# Patient Record
Sex: Female | Born: 1935
Health system: Southern US, Community
[De-identification: ages and names within clinical notes are randomized; demographics above are authoritative.]

## PROBLEM LIST (undated history)

## (undated) DIAGNOSIS — I10 Essential (primary) hypertension: Secondary | ICD-10-CM

## (undated) DIAGNOSIS — I509 Heart failure, unspecified: Secondary | ICD-10-CM

## (undated) DIAGNOSIS — I6529 Occlusion and stenosis of unspecified carotid artery: Secondary | ICD-10-CM

## (undated) DIAGNOSIS — M1990 Unspecified osteoarthritis, unspecified site: Secondary | ICD-10-CM

## (undated) DIAGNOSIS — M069 Rheumatoid arthritis, unspecified: Secondary | ICD-10-CM

## (undated) DIAGNOSIS — F32A Depression, unspecified: Secondary | ICD-10-CM

## (undated) DIAGNOSIS — I4891 Unspecified atrial fibrillation: Secondary | ICD-10-CM

## (undated) DIAGNOSIS — M2550 Pain in unspecified joint: Secondary | ICD-10-CM

## (undated) DIAGNOSIS — F0390 Unspecified dementia without behavioral disturbance: Secondary | ICD-10-CM

## (undated) DIAGNOSIS — E079 Disorder of thyroid, unspecified: Secondary | ICD-10-CM

## (undated) DIAGNOSIS — I2510 Atherosclerotic heart disease of native coronary artery without angina pectoris: Secondary | ICD-10-CM

## (undated) DIAGNOSIS — F0150 Vascular dementia without behavioral disturbance: Secondary | ICD-10-CM

## (undated) DIAGNOSIS — E039 Hypothyroidism, unspecified: Secondary | ICD-10-CM

## (undated) DIAGNOSIS — F419 Anxiety disorder, unspecified: Secondary | ICD-10-CM

## (undated) DIAGNOSIS — D649 Anemia, unspecified: Secondary | ICD-10-CM

## (undated) DIAGNOSIS — K922 Gastrointestinal hemorrhage, unspecified: Secondary | ICD-10-CM

## (undated) DIAGNOSIS — K859 Acute pancreatitis without necrosis or infection, unspecified: Secondary | ICD-10-CM

## (undated) DIAGNOSIS — M6281 Muscle weakness (generalized): Secondary | ICD-10-CM

## (undated) DIAGNOSIS — K219 Gastro-esophageal reflux disease without esophagitis: Secondary | ICD-10-CM

## (undated) DIAGNOSIS — I739 Peripheral vascular disease, unspecified: Secondary | ICD-10-CM

## (undated) DIAGNOSIS — N289 Disorder of kidney and ureter, unspecified: Secondary | ICD-10-CM

## (undated) DIAGNOSIS — E785 Hyperlipidemia, unspecified: Secondary | ICD-10-CM

## (undated) DIAGNOSIS — I82409 Acute embolism and thrombosis of unspecified deep veins of unspecified lower extremity: Secondary | ICD-10-CM

## (undated) DIAGNOSIS — I639 Cerebral infarction, unspecified: Secondary | ICD-10-CM

## (undated) DIAGNOSIS — N189 Chronic kidney disease, unspecified: Secondary | ICD-10-CM

## (undated) HISTORY — DX: Unspecified osteoarthritis, unspecified site: M19.90

## (undated) HISTORY — PX: TOTAL KNEE ARTHROPLASTY: SHX125

## (undated) HISTORY — PX: FASCIOTOMY: SHX132

## (undated) HISTORY — PX: HIP FRACTURE SURGERY: SHX118

## (undated) HISTORY — DX: Cerebral infarction, unspecified: I63.9

## (undated) HISTORY — PX: TUBAL LIGATION: SHX77

## (undated) HISTORY — DX: Anemia, unspecified: D64.9

## (undated) HISTORY — DX: Hypothyroidism, unspecified: E03.9

## (undated) HISTORY — DX: Occlusion and stenosis of unspecified carotid artery: I65.29

## (undated) HISTORY — DX: Peripheral vascular disease, unspecified: I73.9

## (undated) HISTORY — DX: Unspecified atrial fibrillation: I48.91

## (undated) HISTORY — DX: Unspecified dementia, unspecified severity, without behavioral disturbance, psychotic disturbance, mood disturbance, and anxiety: F03.90

## (undated) HISTORY — DX: Gastro-esophageal reflux disease without esophagitis: K21.9

## (undated) HISTORY — PX: JOINT REPLACEMENT: SHX530

## (undated) HISTORY — PX: CORONARY ARTERY BYPASS GRAFT: SHX141

## (undated) HISTORY — DX: Heart failure, unspecified: I50.9

## (undated) HISTORY — DX: Depression, unspecified: F32.A

## (undated) HISTORY — DX: Acute pancreatitis without necrosis or infection, unspecified: K85.90

## (undated) HISTORY — DX: Pain in unspecified joint: M25.50

## (undated) HISTORY — DX: Gastrointestinal hemorrhage, unspecified: K92.2

## (undated) HISTORY — DX: Disorder of thyroid, unspecified: E07.9

## (undated) HISTORY — DX: Rheumatoid arthritis, unspecified: M06.9

## (undated) HISTORY — DX: Anxiety disorder, unspecified: F41.9

## (undated) HISTORY — DX: Chronic kidney disease, unspecified: N18.9

## (undated) HISTORY — DX: Acute embolism and thrombosis of unspecified deep veins of unspecified lower extremity: I82.409

## (undated) HISTORY — PX: CELIAC ARTERY STENT: SHX1319

## (undated) HISTORY — DX: Atherosclerotic heart disease of native coronary artery without angina pectoris: I25.10

## (undated) HISTORY — DX: Essential (primary) hypertension: I10

## (undated) HISTORY — PX: APPENDECTOMY: SHX54

---

## 2005-04-07 ENCOUNTER — Ambulatory Visit: Payer: Self-pay | Admitting: Cardiology

## 2007-04-12 HISTORY — PX: EMBOLECTOMY: SHX44

## 2008-01-24 ENCOUNTER — Ambulatory Visit: Payer: Self-pay | Admitting: Cardiology

## 2008-01-27 ENCOUNTER — Ambulatory Visit: Payer: Self-pay | Admitting: Cardiothoracic Surgery

## 2008-01-27 ENCOUNTER — Ambulatory Visit: Payer: Self-pay | Admitting: Cardiology

## 2008-01-27 ENCOUNTER — Inpatient Hospital Stay (HOSPITAL_COMMUNITY): Admission: AD | Admit: 2008-01-27 | Discharge: 2008-02-22 | Payer: Self-pay | Admitting: Cardiology

## 2008-01-28 ENCOUNTER — Encounter: Payer: Self-pay | Admitting: Cardiothoracic Surgery

## 2008-01-29 IMAGING — CR DG CHEST 1V PORT
1 series · 1 of 1 positions shown · non-contrast
Comparison: [DATE]

CLINICAL DATA: Status post CABG

PORTABLE CHEST - 1 VIEW

[AP]
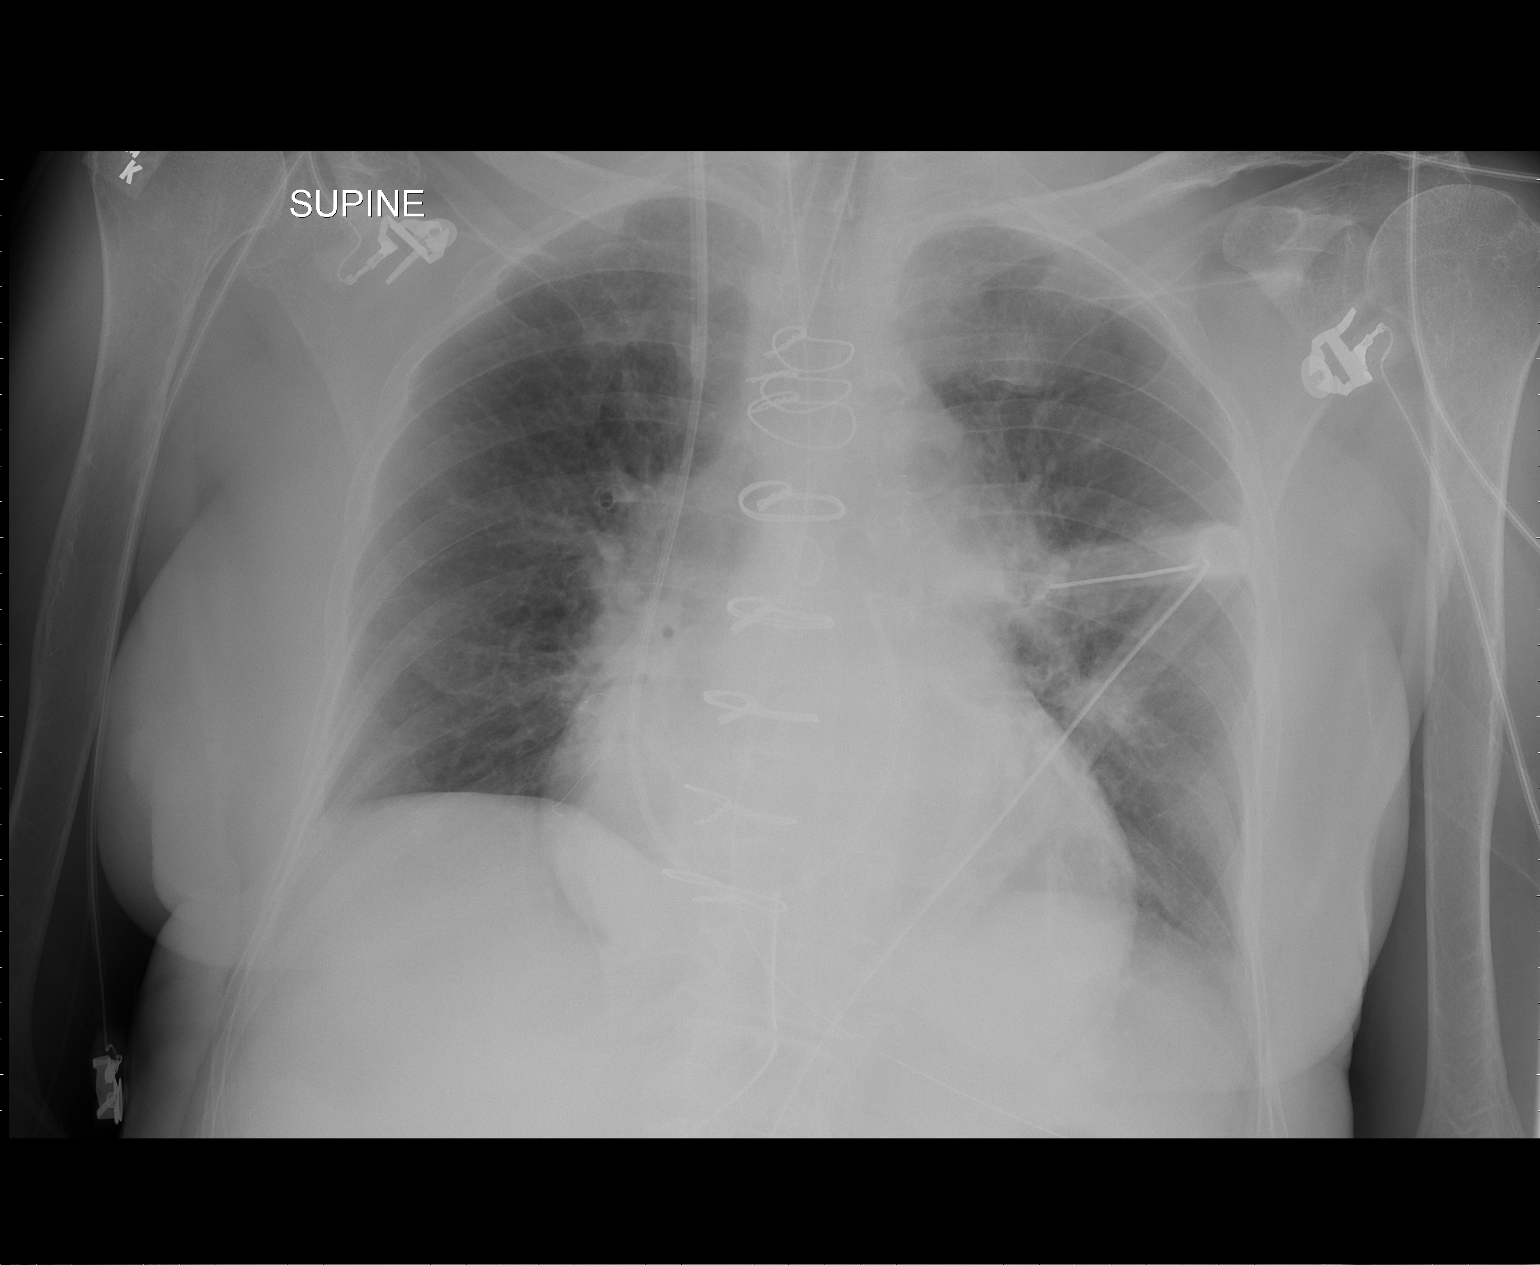

[1 of 1 positions shown; findings below may reference images not displayed]

FINDINGS: Left chest tube present postoperatively.  No evidence of
pneumothorax.  Endotracheal tube tip lies approximately 3.5 cm
above the carina.  Swan-Ganz catheter tip lies in the main
pulmonary outflow tract.  Nasogastric tube extends into the
stomach.

No evidence of edema or significant pleural fluid.  Left lower lobe
atelectasis present.  The heart size and mediastinal contours are
stable since prior study.
IMPRESSION: Left lower lobe atelectasis postoperatively.  No pneumothorax or
edema.  Satisfactory positioning of support apparatus.

## 2008-01-30 IMAGING — CR DG CHEST 1V PORT
1 series · 1 of 1 positions shown · non-contrast
Comparison: [DATE].
COMPARISON:

Addendum Begins

PORTABLE CHEST, one-view:
CLINICAL DATA: PORTABLE CHEST - 1 VIEW

[AP]
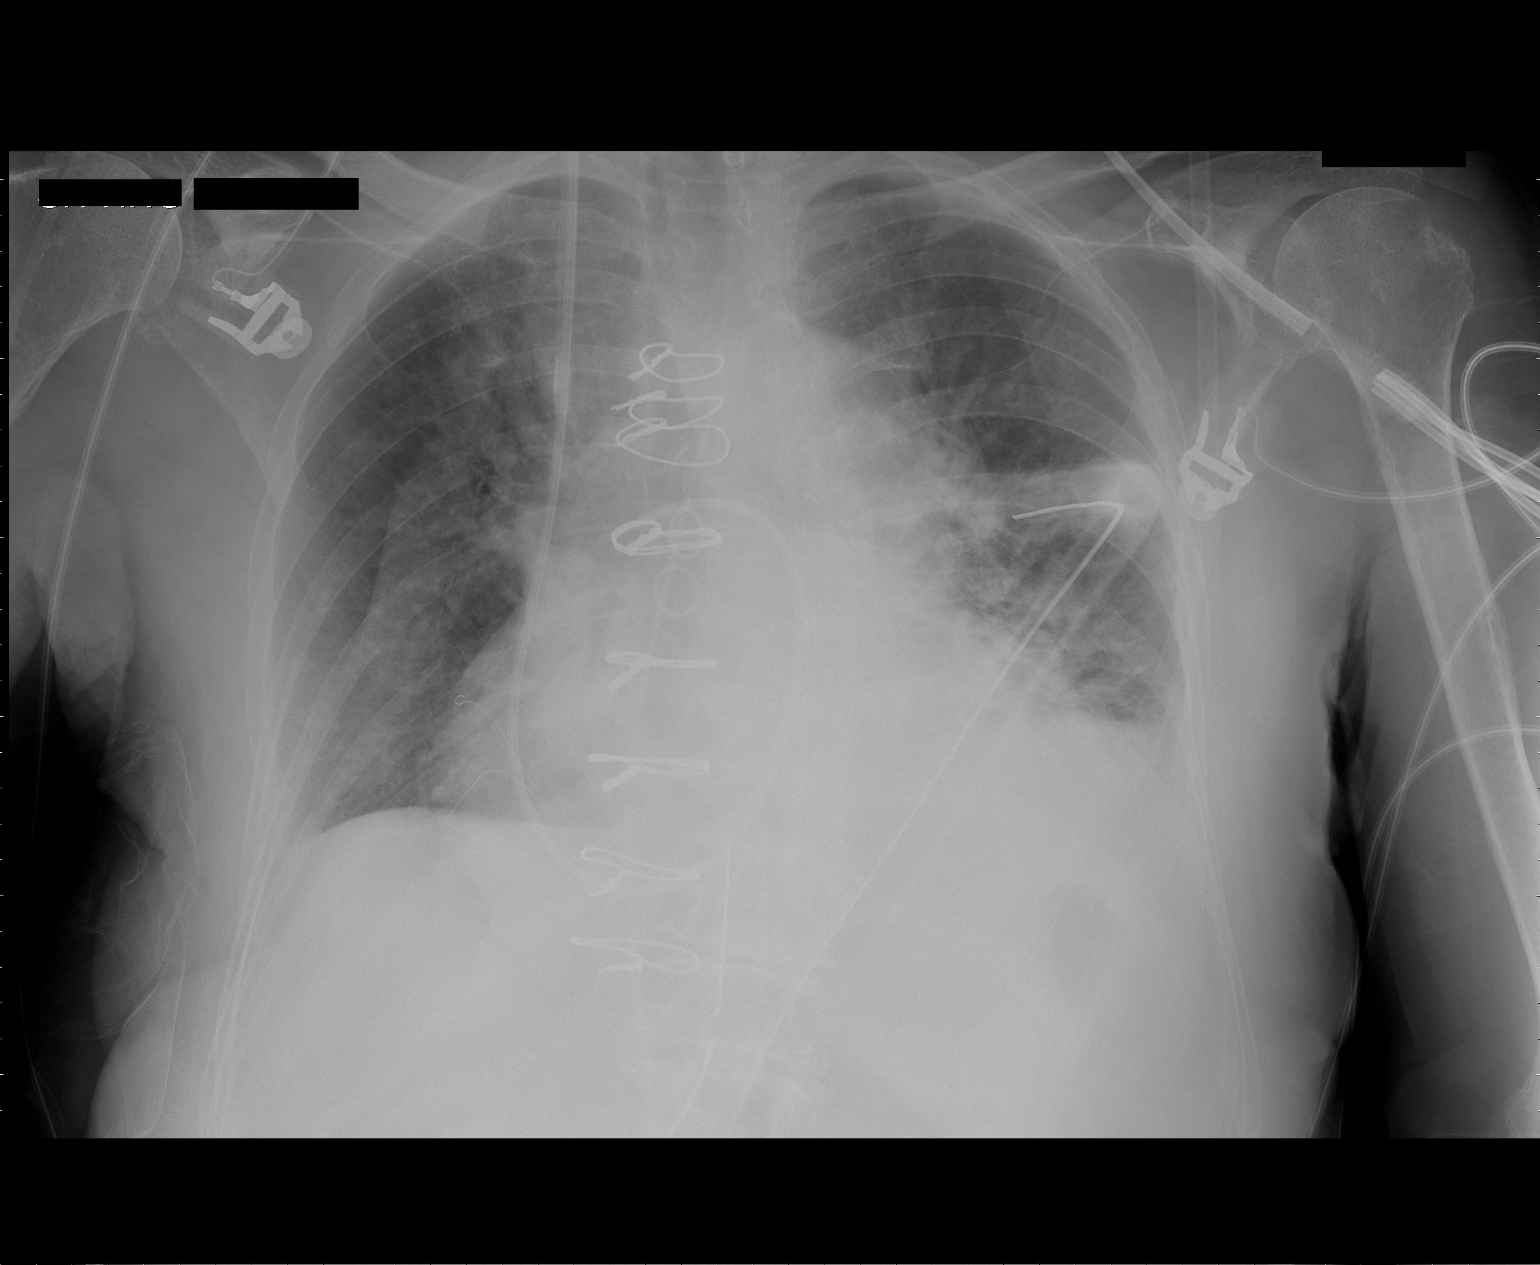

[1 of 1 positions shown; findings below may reference images not displayed]

FINDINGS: The ET tube and NG tube have been removed.  Mediastinal
and left pleural chest tubes are in position.  SGC is in the
proximal descending portion of the right PA.  Mild atelectasis at
the left base.  No pneumothorax.  Pulmonary vascular congestion may
have increased slightly in degree.
IMPRESSION: No pneumothorax.  Left base atelectasis.  Pulmonary
vascular congestion.

Addendum Ends
FINDINGS: 
IMPRESSION:

## 2008-01-31 IMAGING — CR DG CHEST 1V PORT
1 series · 1 of 1 positions shown · non-contrast
Comparison: [DATE] study.

CLINICAL DATA: History given of coronary bypass grafting.

PORTABLE CHEST - 1 VIEW

[view not recorded]
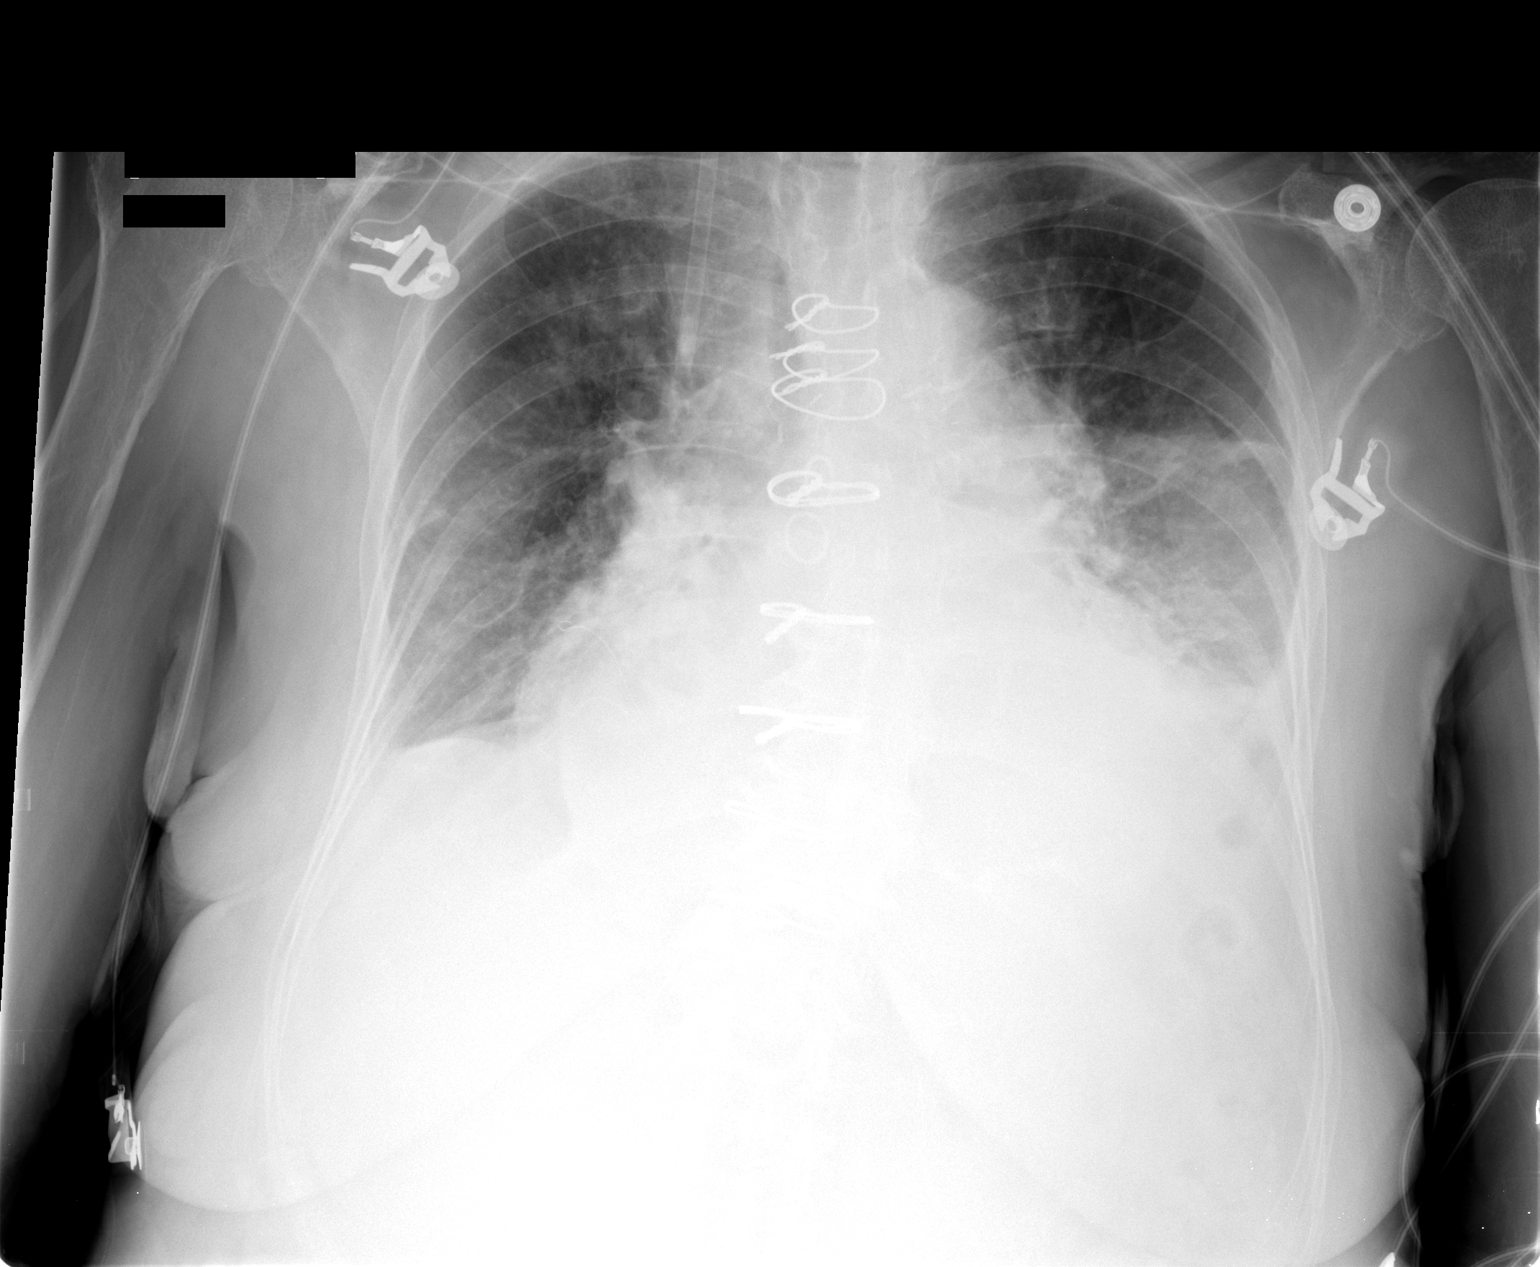

[1 of 1 positions shown; findings below may reference images not displayed]

FINDINGS: Previous median sternotomy has been performed.  Right
venous catheter has tip in superior vena cava.  There is stable
moderate enlargement of the cardiac silhouette.  The midline
drainage tube has been removed.  There is minimal atelectasis in
the right base.  The left chest tube has been removed.  There is a
tiny apical pneumothorax on the left.  There is haziness and
atelectasis and infiltrative density seen in the left mid and lower
lung zones with small left pleural effusion.
IMPRESSION: The left chest tube has been removed.  There is a tiny
apical pneumothorax on the left.  There is haziness and atelectasis
and infiltrative density seen in the left mid and lower lung zones
with small left pleural effusion.

## 2008-02-01 IMAGING — CR DG CHEST 2V
2 series · 2 of 2 positions shown · non-contrast
Comparison: [DATE]

CLINICAL DATA: Chest pain.  Bypass surgery.

CHEST - 2 VIEW

[w chest pa]
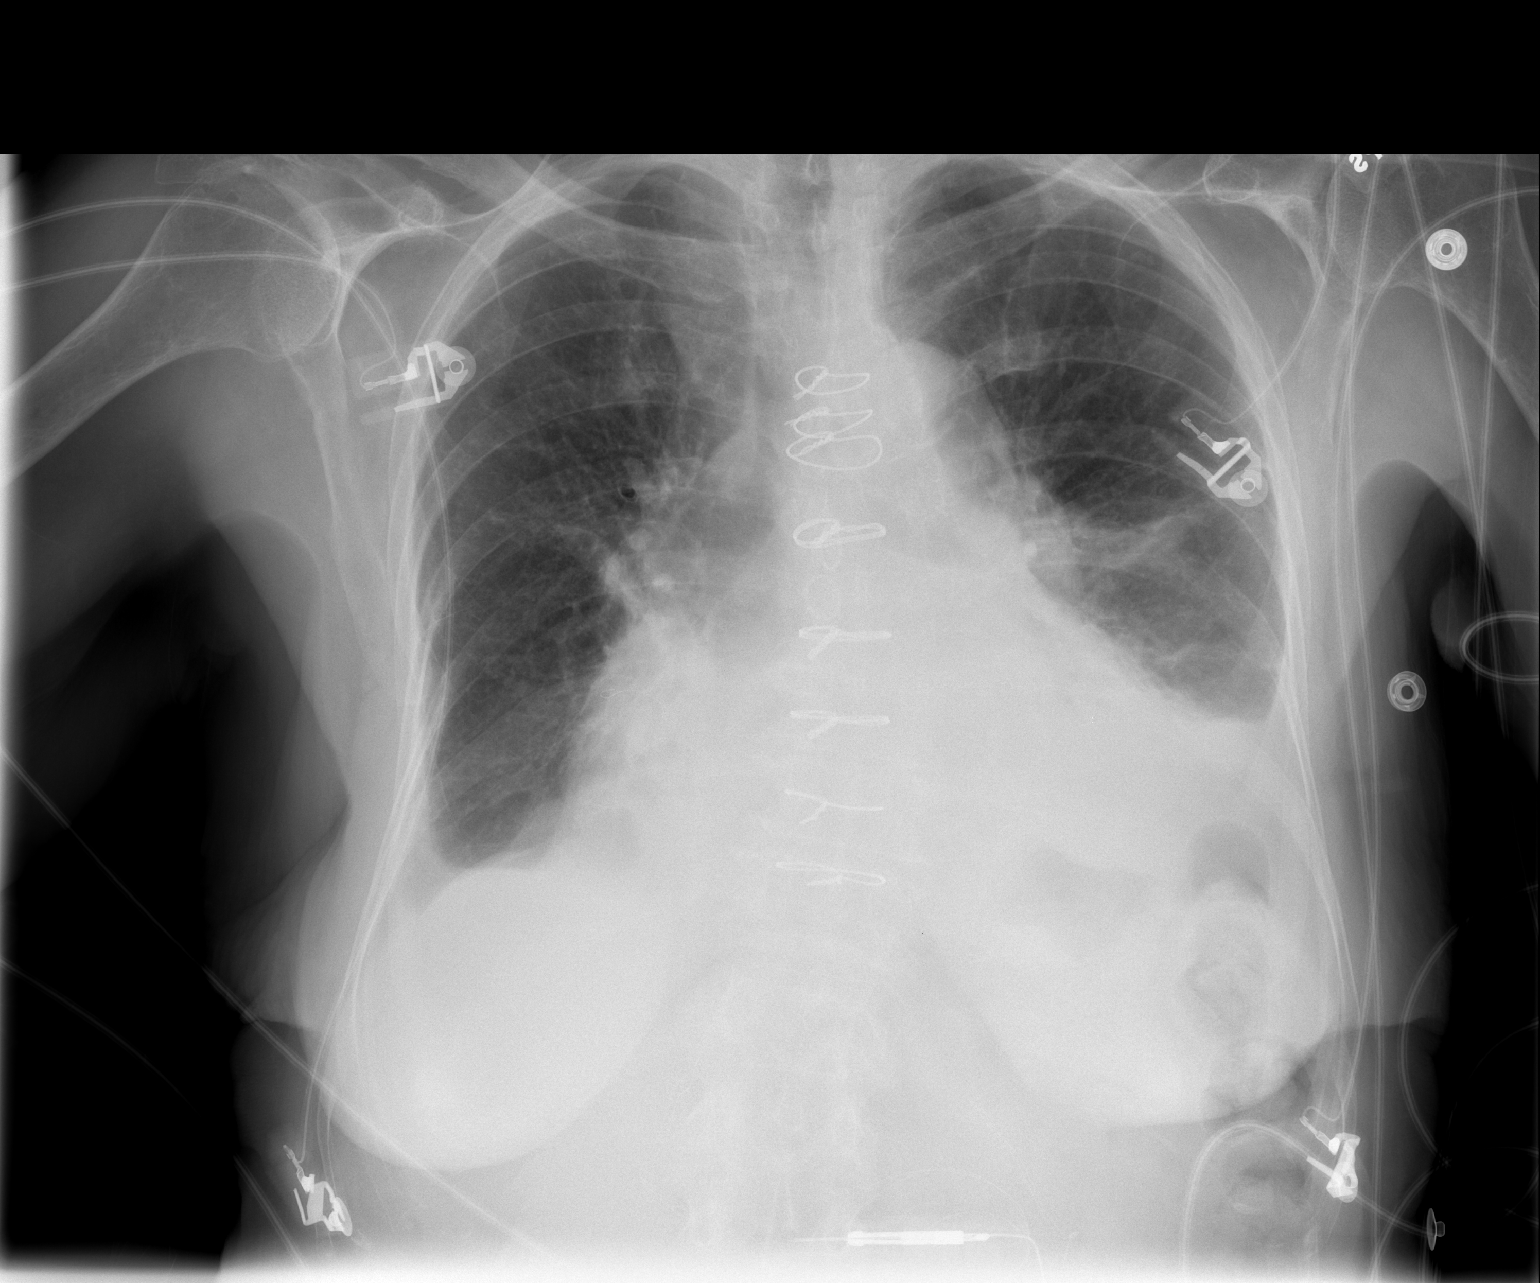

[w chest lat]
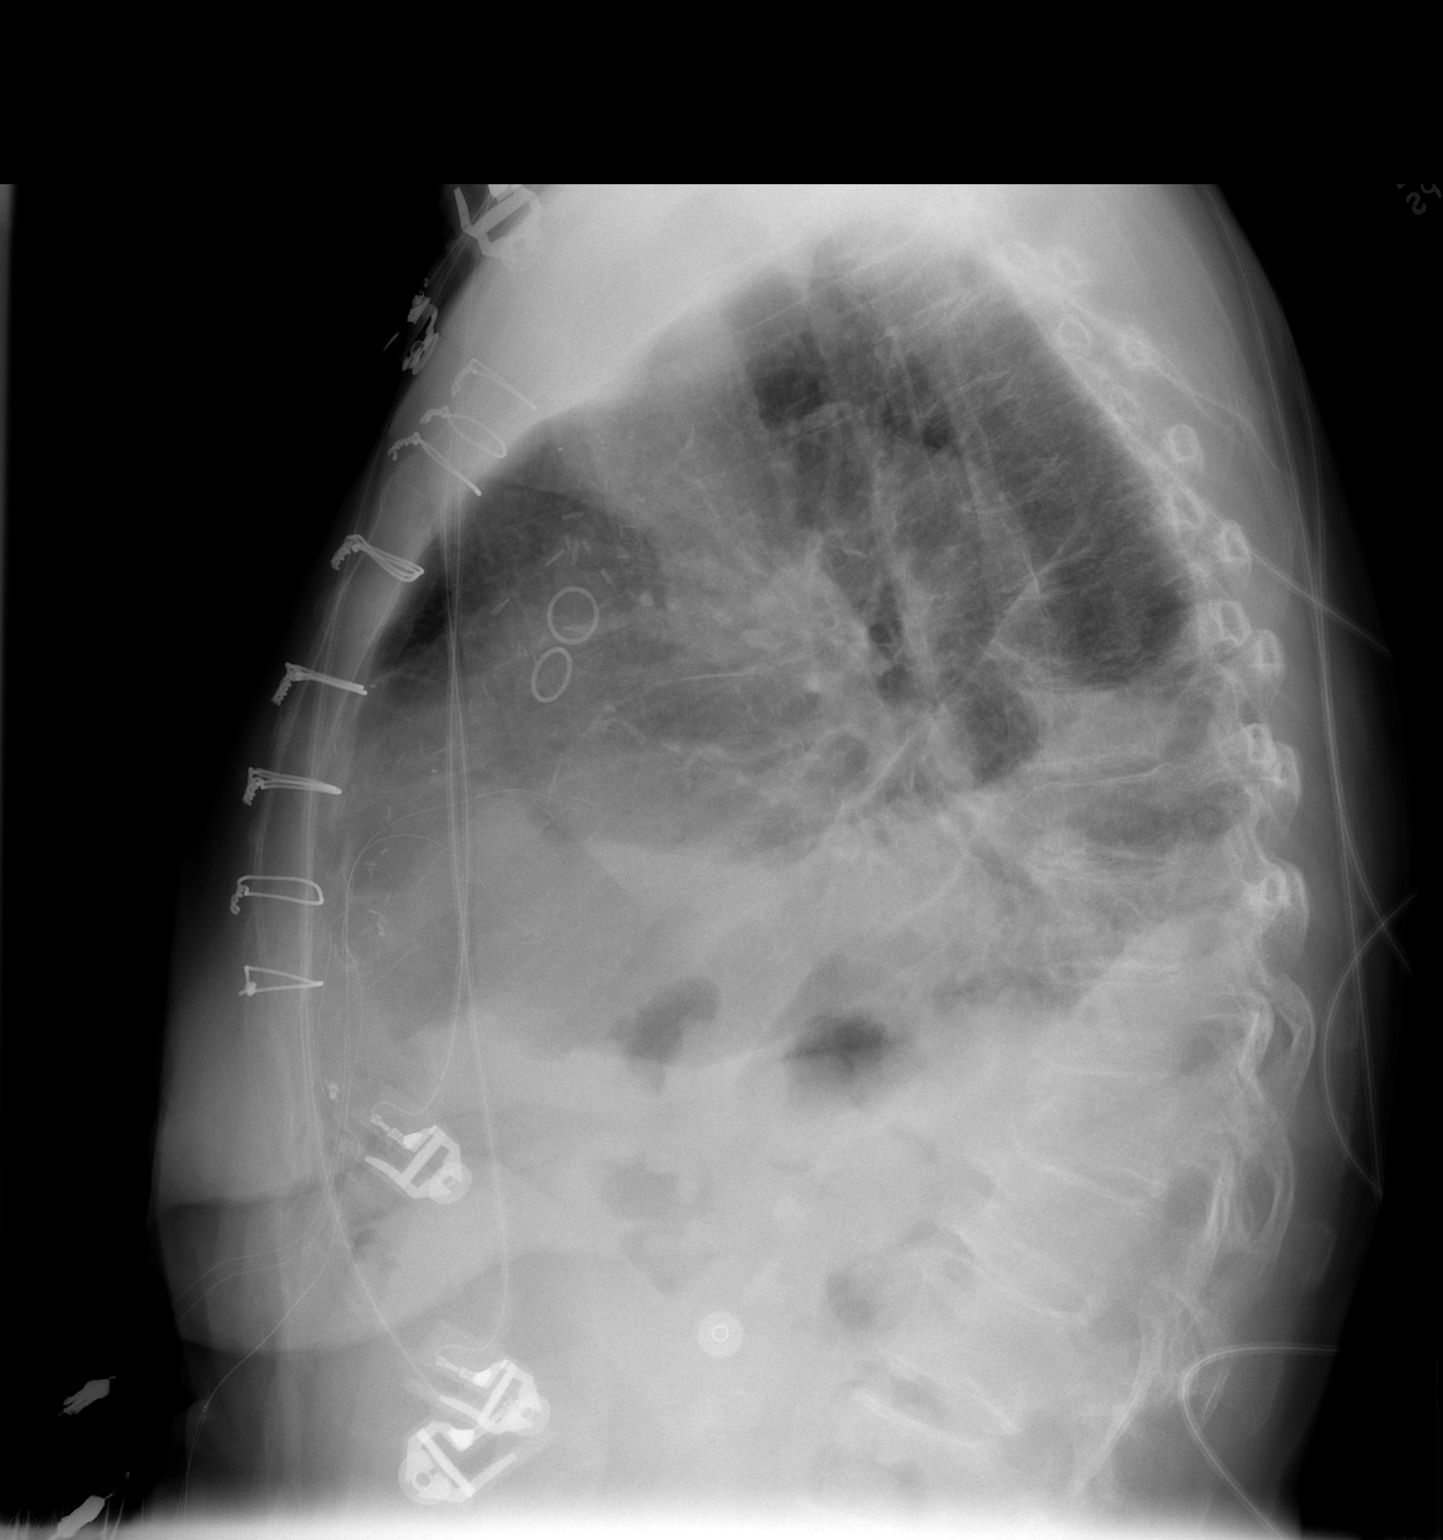

[2 of 2 positions shown; findings below may reference images not displayed]

FINDINGS: Improved lung aeration with resolving edema and
atelectasis.  There are persistent small bilateral pleural
effusions with overlying atelectasis.  No definite pneumothorax.
IMPRESSION: 1.  Improving lung aeration with resolving edema and atelectasis.
2.  Persistent bilateral pleural effusions, left greater than right
with overlying atelectasis.
3.  Removal of right IJ Cordis.

## 2008-02-04 ENCOUNTER — Encounter: Payer: Self-pay | Admitting: Cardiothoracic Surgery

## 2008-02-04 ENCOUNTER — Ambulatory Visit: Payer: Self-pay | Admitting: *Deleted

## 2008-02-05 ENCOUNTER — Encounter: Payer: Self-pay | Admitting: Vascular Surgery

## 2008-02-05 IMAGING — CR DG ANG/EXT/UNI/OR RIGHT
1 series · 1 of 1 positions shown · non-contrast
Comparison: None.

CLINICAL DATA: Peripheral vascular disease.

INTRAOPERATIVE  LEFT LOWER EXTREMITY ARTERIOGRAPHY [DATE]:

[view not recorded]
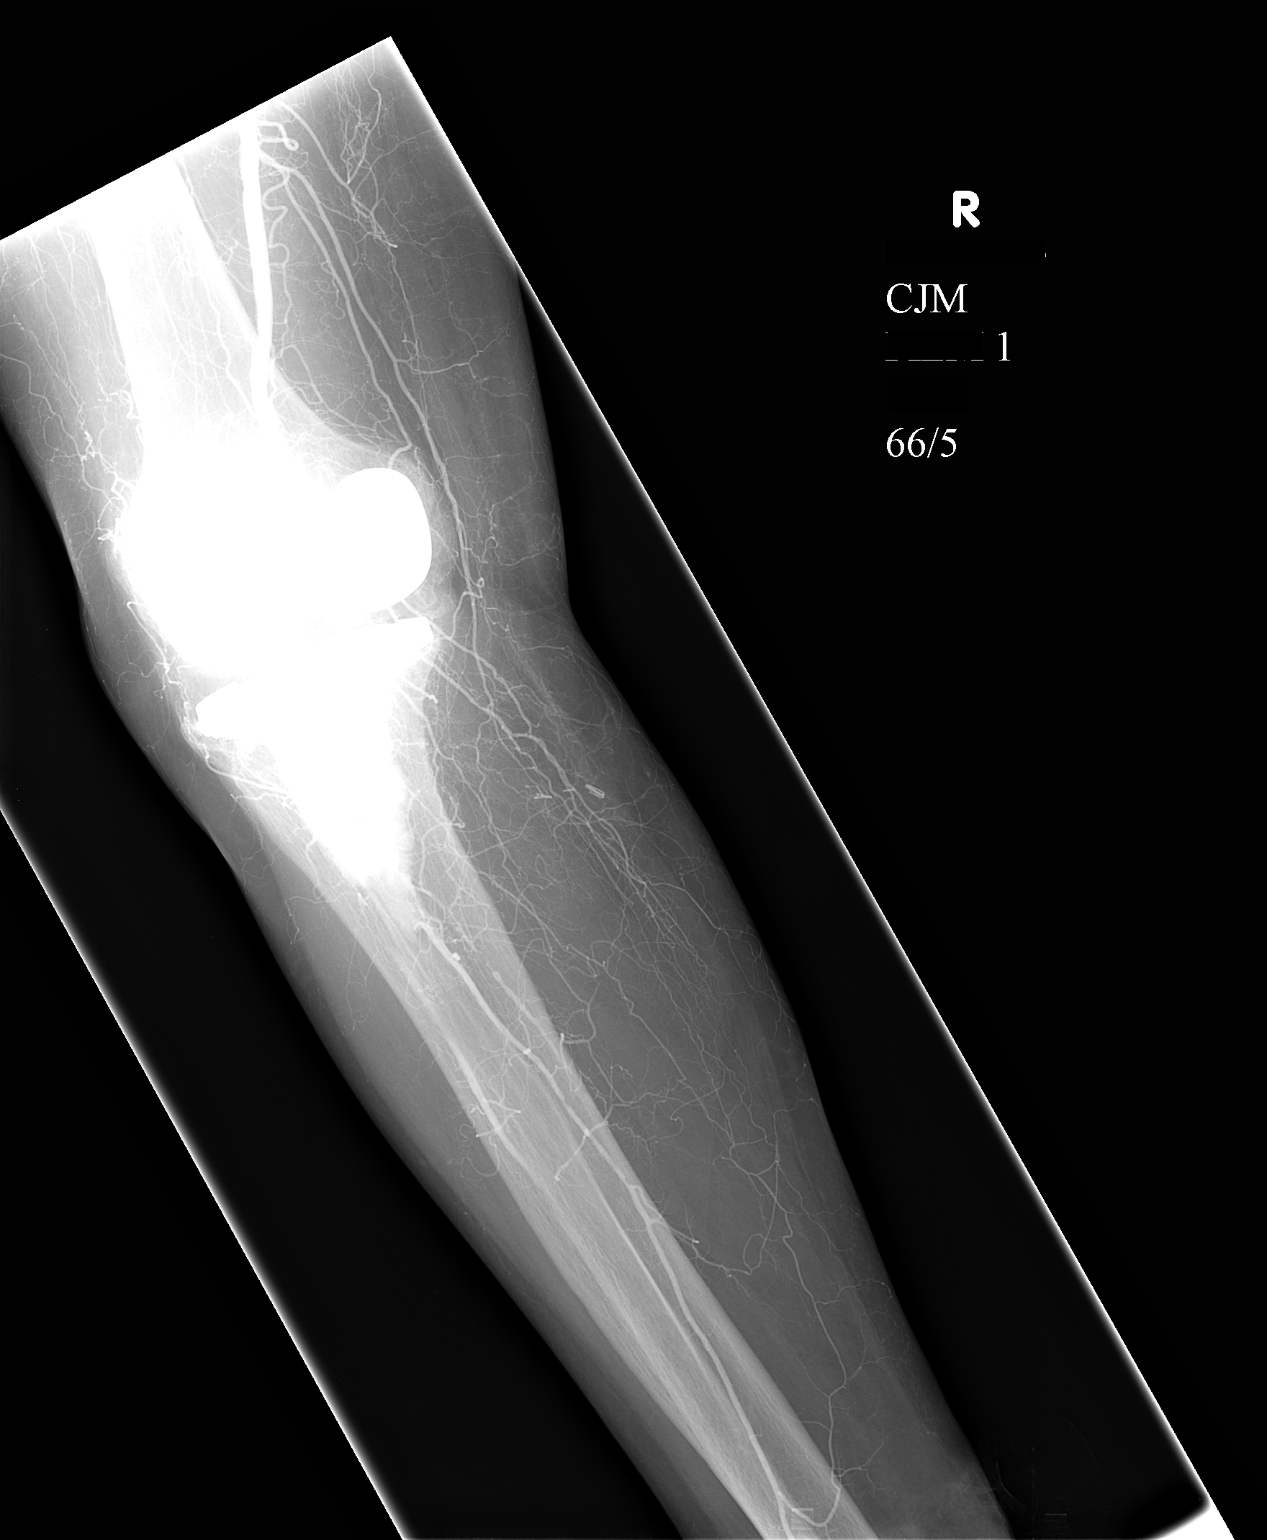

[1 of 1 positions shown; findings below may reference images not displayed]

FINDINGS: Single lateral image centered over the left lower leg
obtained at [13] hours and submitted for interpretation
postoperatively demonstrates atherosclerotic disease in the
popliteal artery above the knee.  Into to rule artery occluded at
its origin.  Tibioperoneal trunk severely diseased.  Runoff is
single vessel via a diseased peroneal artery.  Collateral
reconstitution of the anterior tibial and a severely diseased
posterior tibial artery also occurs but these do not continue
distally on this image.
IMPRESSION: Severely diseased tibioperoneal trunk.  Single vessel runoff via a
diseased peroneal artery. Occluded anterior tibial artery at its
origin with collateral reconstitution in its midportion.
Reconstitution of a severely diseased posterior tibial artery.

## 2008-02-05 IMAGING — CR DG ANG/EXT/UNI/OR RIGHT
1 series · 1 of 1 positions shown · non-contrast
Comparison: Intraoperative right lower extremity arteriogram
performed earlier at [ZP] hours.

CLINICAL DATA: Anterior tibial artery embolectomy.

INTRAOPERATIVE  RIGHT LOWER ARTERIOGRAPHY [DATE]:

[view not recorded]
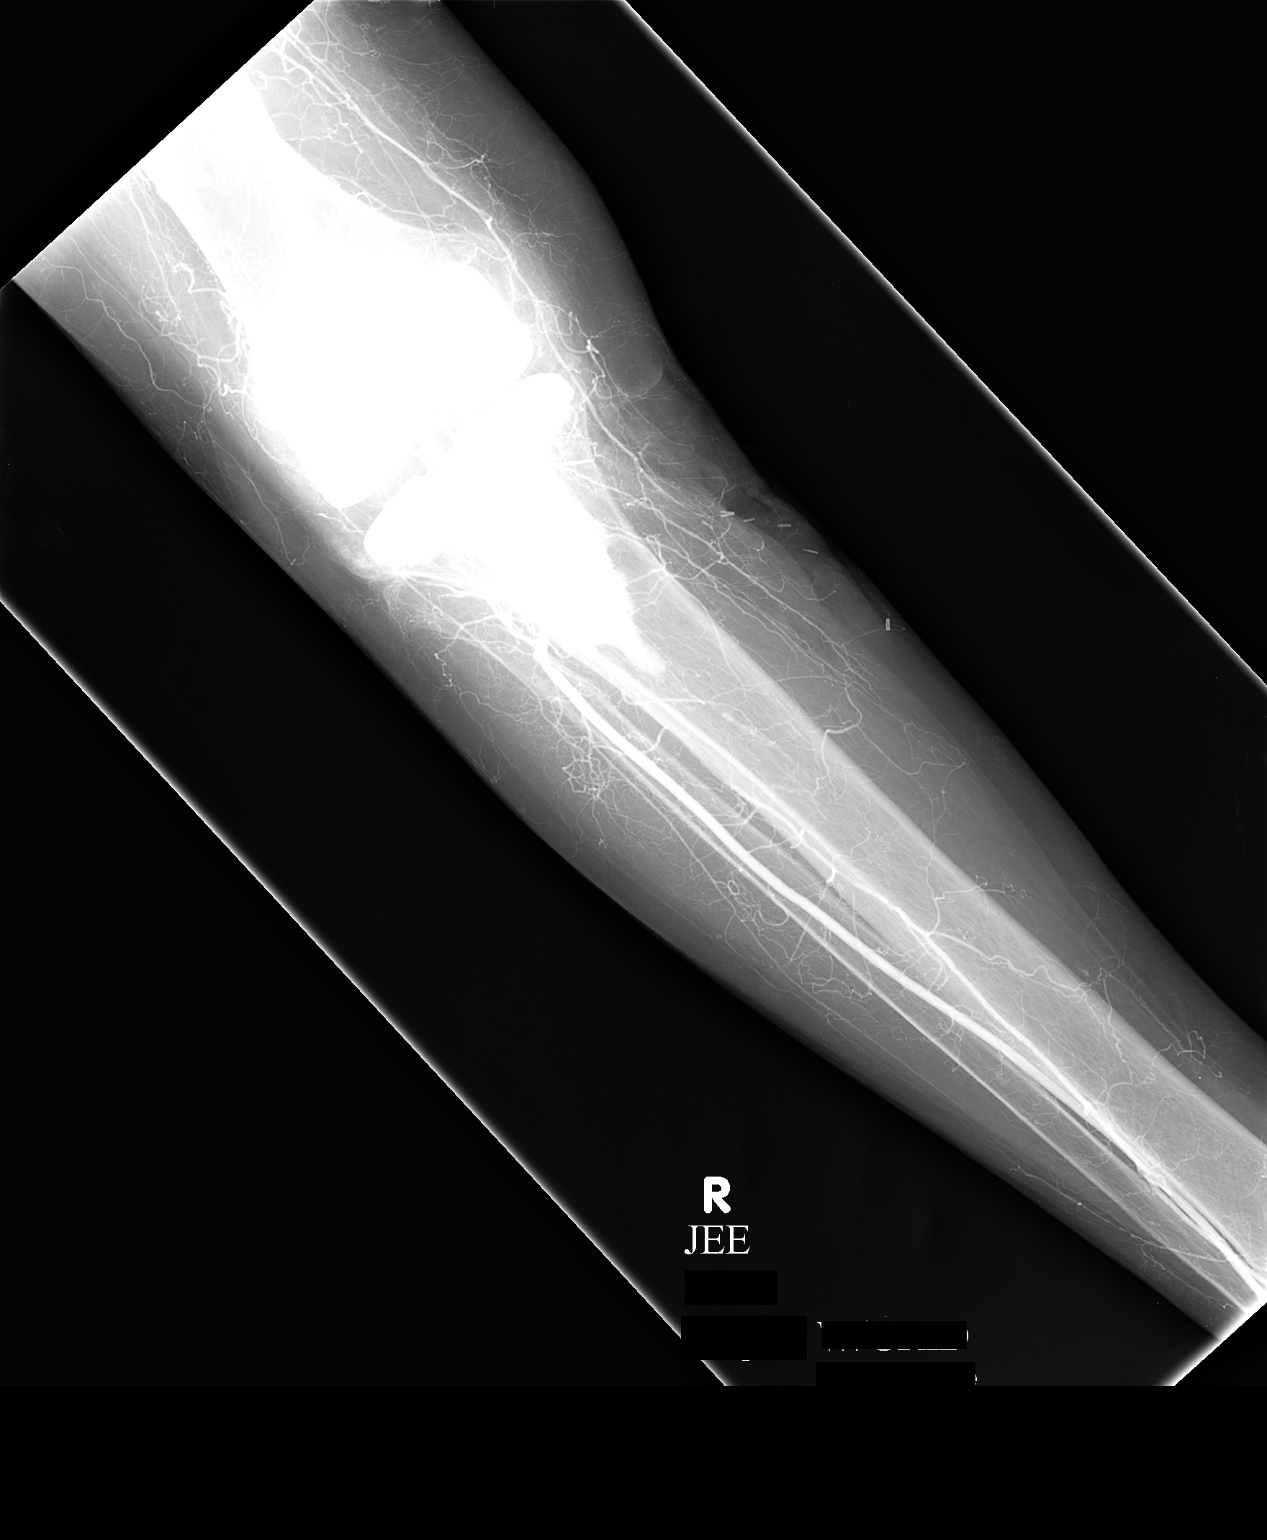

[1 of 1 positions shown; findings below may reference images not displayed]

FINDINGS: Interval anterior tibial embolectomy, with wide patency
of the anterior tibial artery.  Occlusion of the tibioperoneal
trunk.  Reconstitution of the diseased peroneal artery in the mid
calf.  Posterior tibial artery not identified.
IMPRESSION: Wide patency of the anterior tibial artery after embolectomy.
Occluded tibioperoneal trunk with reconstitution of a diseased
peroneal artery in the midcalf.

## 2008-02-07 IMAGING — CR DG CHEST 1V PORT
1 series · 1 of 1 positions shown · non-contrast
Comparison: [DATE]

CLINICAL DATA: Angina

PORTABLE CHEST - 1 VIEW

[view not recorded]
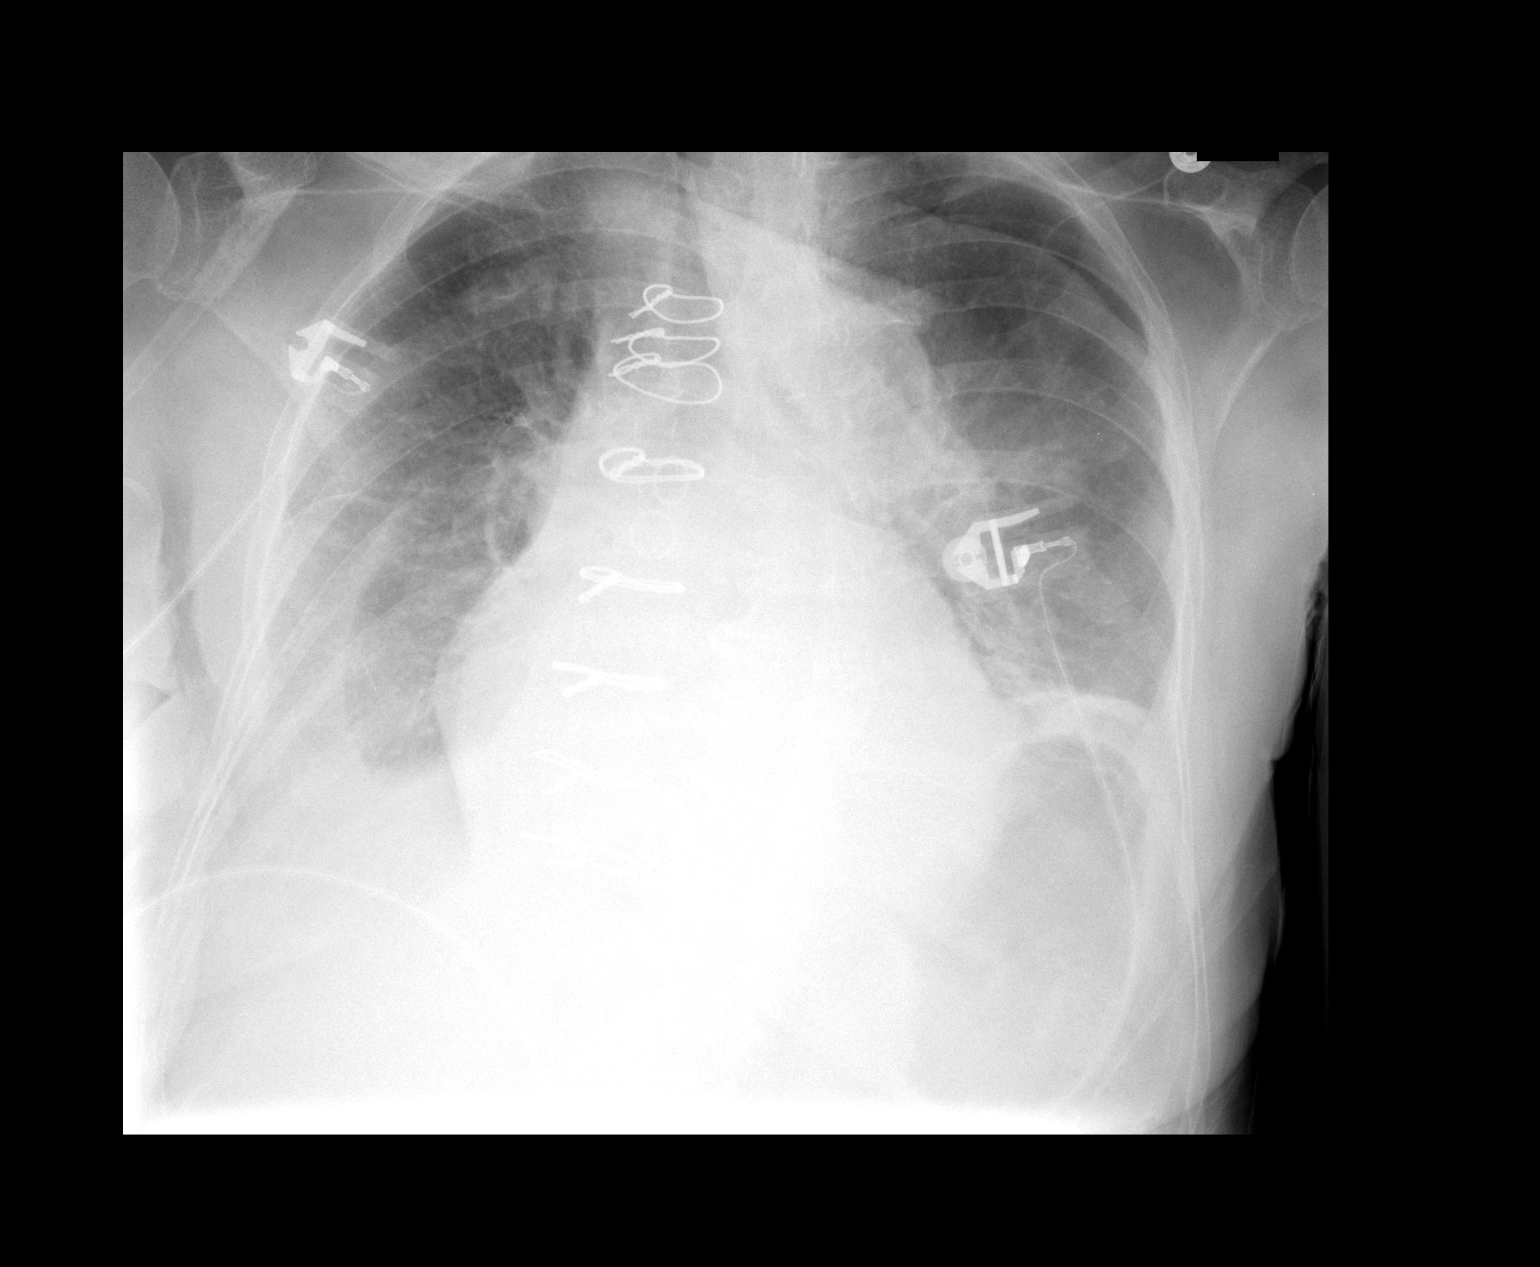

[1 of 1 positions shown; findings below may reference images not displayed]

FINDINGS: Changes of previous CABG.  Stable mild cardiomegaly.
Probable layering pleural effusions.  Some increase in interstitial
edema or infiltrates since previous exam.  Atelectasis or
consolidation in both lung bases.
IMPRESSION: 1.  Some increase in bilateral interstitial edema or infiltrate.
2.  Probable pleural effusions with adjacent atelectasis or
consolidation in the lung bases.
3.  Stable cardiomegaly

## 2008-02-07 IMAGING — CR DG ABD PORTABLE 1V
1 series · 1 of 1 positions shown · non-contrast
Comparison: None

CLINICAL DATA: Abdominal pain.  Rule out free air.

ABDOMEN - 1 VIEW

[AP]
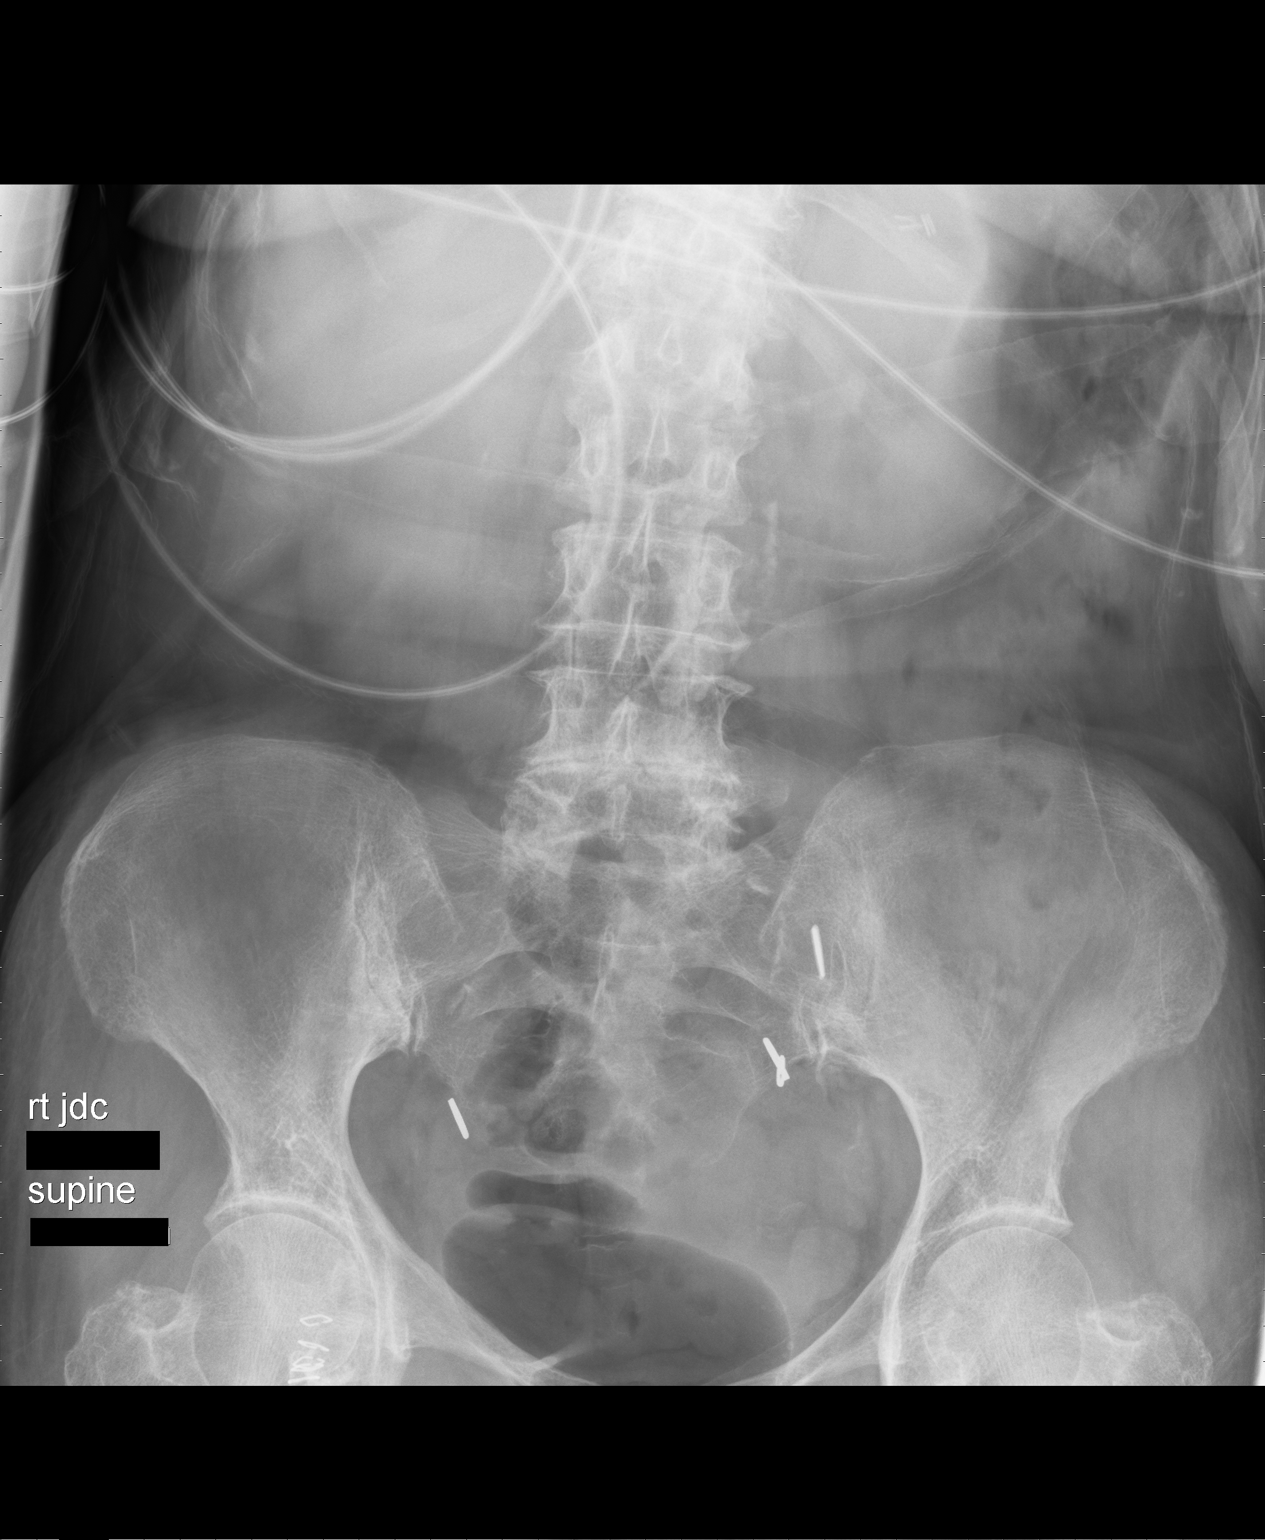

[1 of 1 positions shown; findings below may reference images not displayed]

FINDINGS: Patient has had a median sternotomy.  Surgical clips
overlie the left upper quadrant and pelvis.  Clips are also
identified overlying the right inguinal region.  The visualized
bowel gas pattern is nonobstructive.  There is no evidence for free
intraperitoneal air on this supine view.  To exclude free
intraperitoneal air, erect view of the chest or a left lateral
decubitus view of the abdomen would be required.

Degenerative changes are seen in the spine.
IMPRESSION: Nonobstructive bowel gas pattern.  No evidence for free
intraperitoneal air on this supine view.  See above.

## 2008-02-07 IMAGING — CR DG CHEST 1V PORT
1 series · 1 of 1 positions shown · non-contrast
Comparison: [DATE]

CLINICAL DATA: PICC line placement.

PORTABLE CHEST - 1 VIEW

[AP]
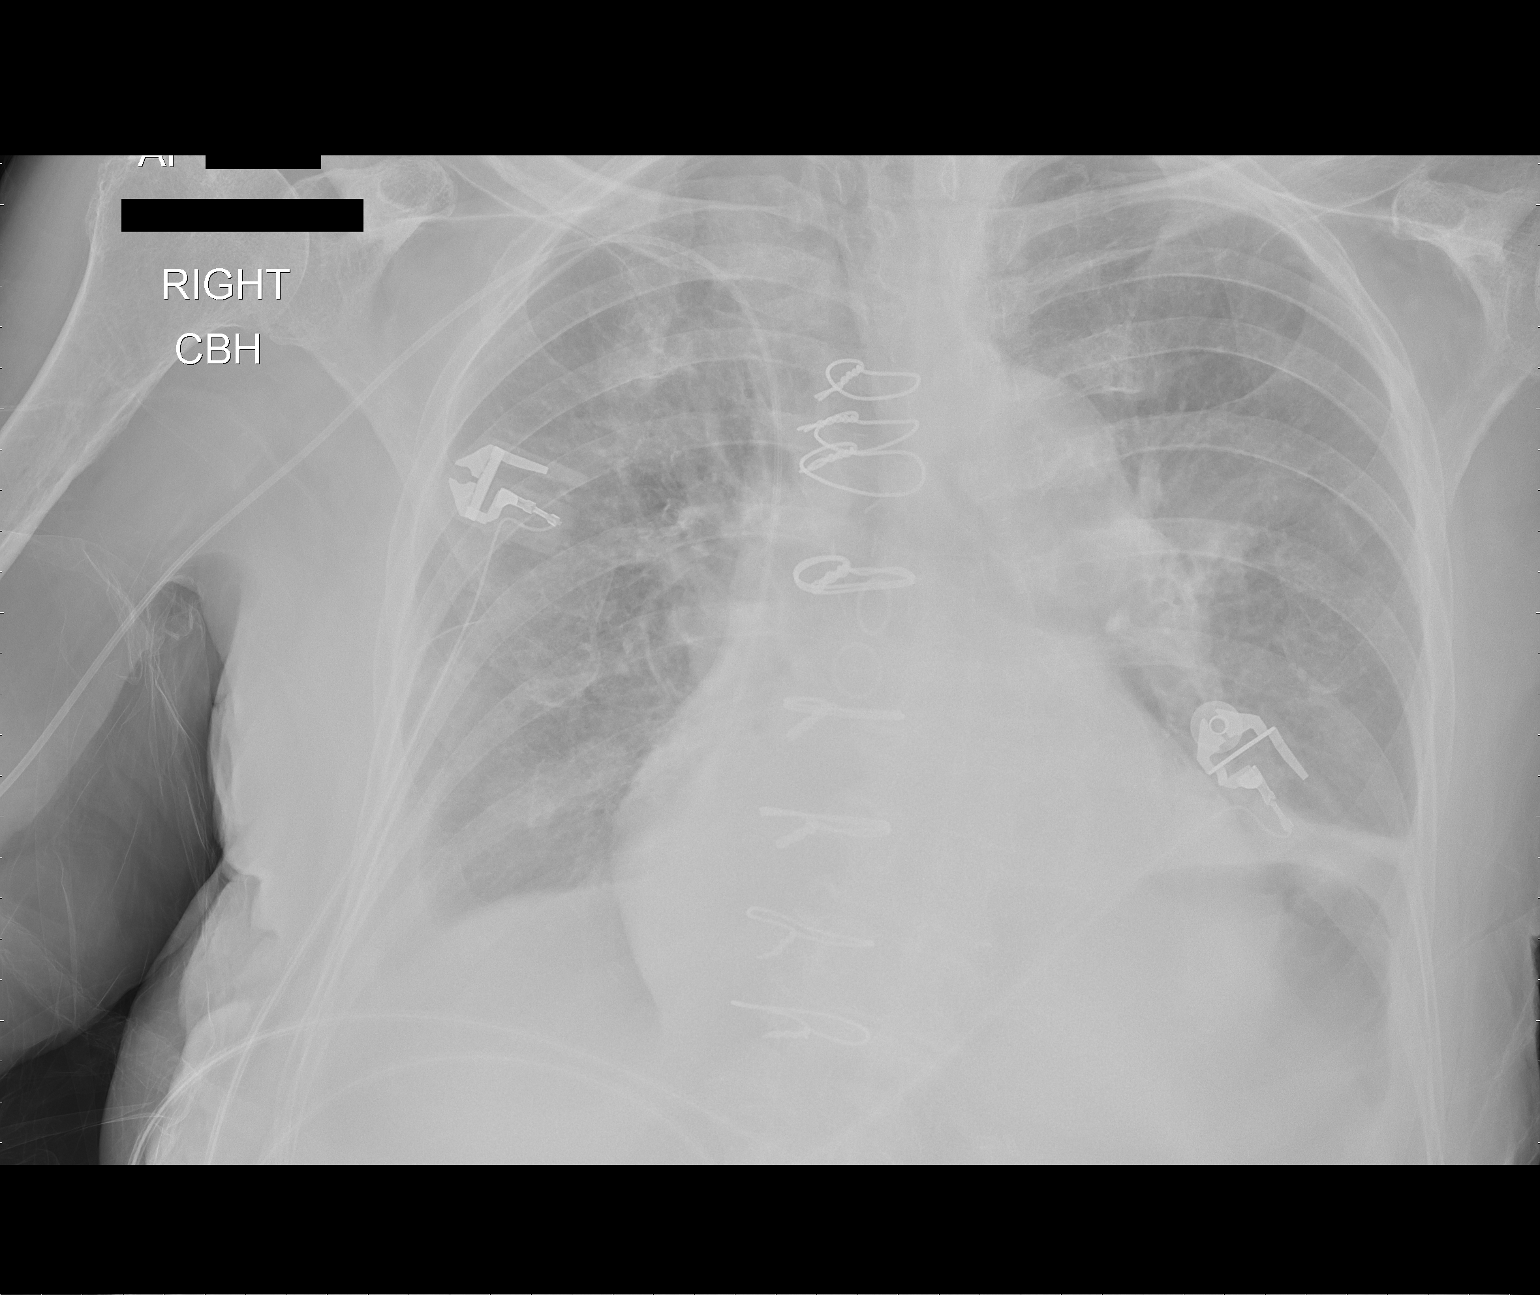

[1 of 1 positions shown; findings below may reference images not displayed]

FINDINGS: Right PICC line is in place.  The tip is at the
cavoatrial junction.  There is cardiomegaly with evidence of edema
and bilateral effusions, unchanged.  The patient is status post
CABG.
IMPRESSION: Right PICC line tip cavoatrial junction.  Otherwise no change.

## 2008-02-07 IMAGING — CT CT ANGIO ABDOMEN
2 of 11 series · 12 of 46 positions shown, 17 images · IV contrast (APPLIED)
Comparison: None

CTA ABDOMEN

CLINICAL DATA: Abdominal pain.  Evaluate for mesenteric artery
stenosis.

CT ANGIOGRAPHY ABDOMEN AND PELVIS
TECHNIQUE: Multidetector CT imaging of the abdomen and pelvis was
performed using the standard protocol during bolus administration
of intravenous contrast. Multiplanar reconstructed images including
MIPs were obtained and reviewed to evaluate the vascular anatomy.
Contrast: 100 ml [7Y]

[Series 7: abd cta 2.0 (id) · axial · 0.65mm/px · z∈[-558,-172]mm · 10 of 223 slices shown, 15 images]
[im 15/223  soft-tissue]
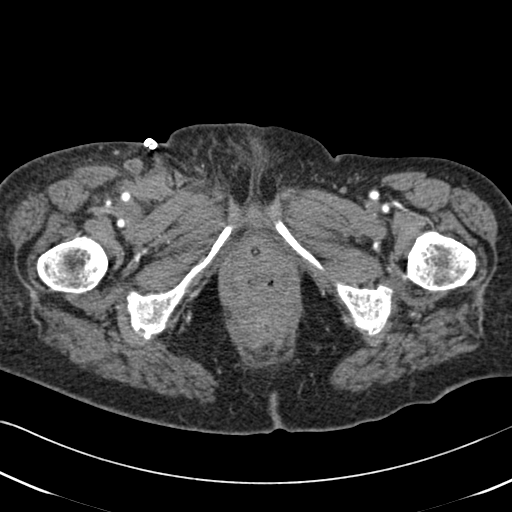
[im 15/223  bone]
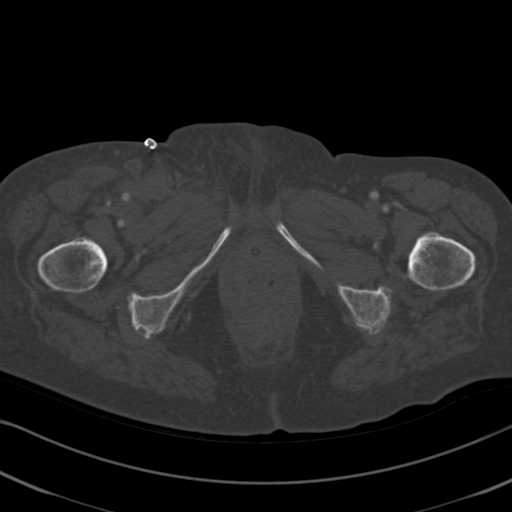
[im 45/223  soft-tissue]
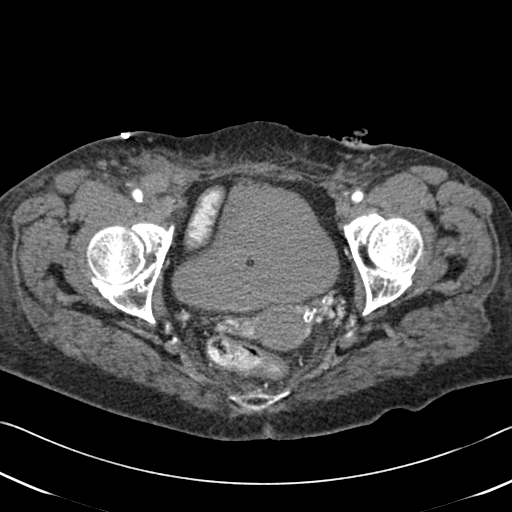
[im 60/223  soft-tissue]
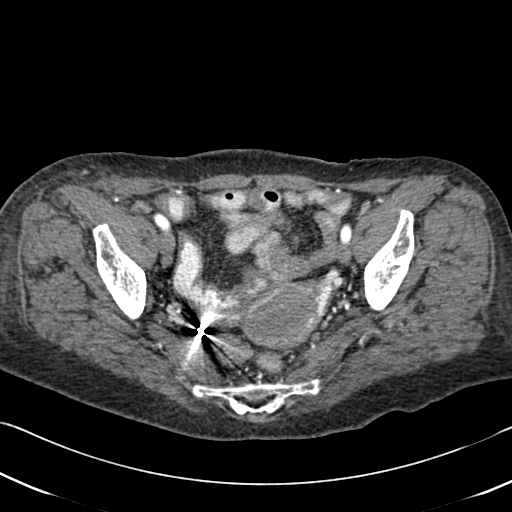
[im 89/223  soft-tissue]
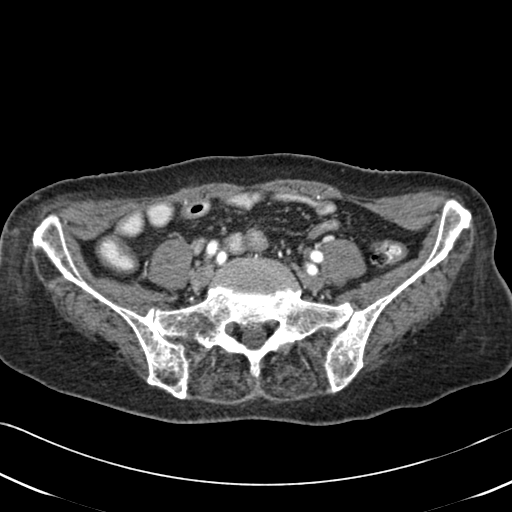
[im 119/223  soft-tissue]
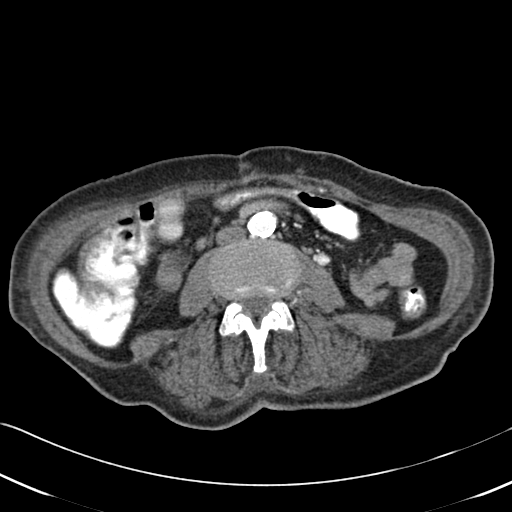
[im 134/223  soft-tissue]
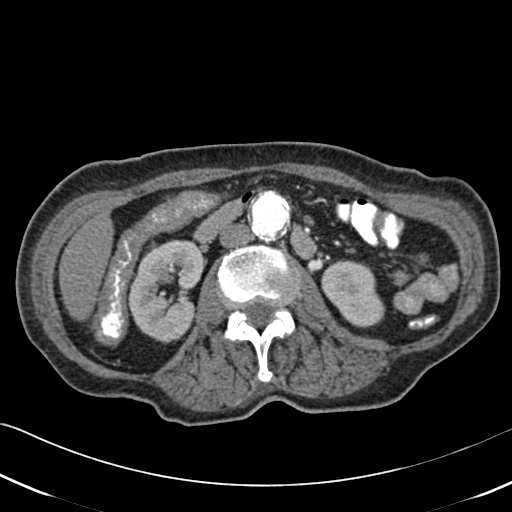
[im 163/223  soft-tissue]
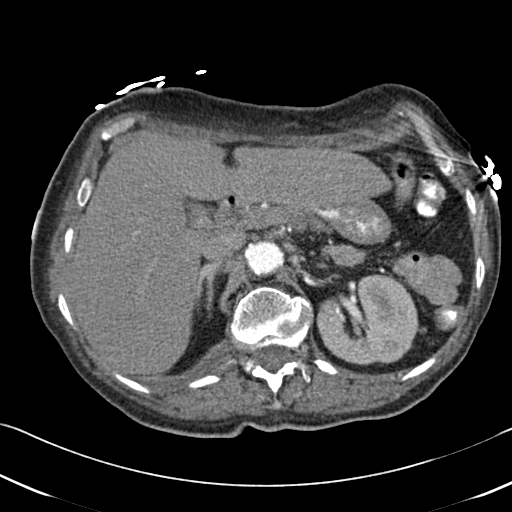
[im 163/223  lung]
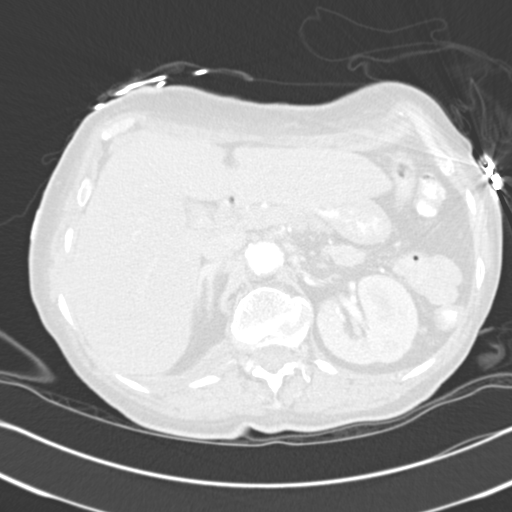
[im 178/223  soft-tissue]
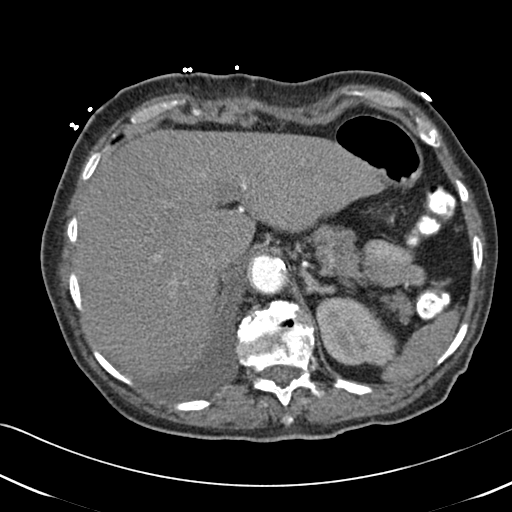
[im 178/223  lung]
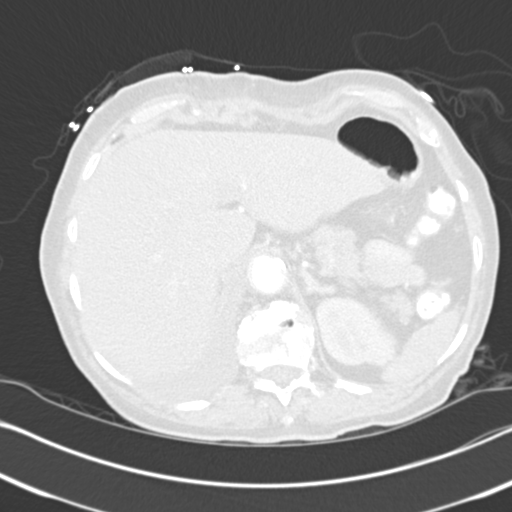
[im 193/223  lung]
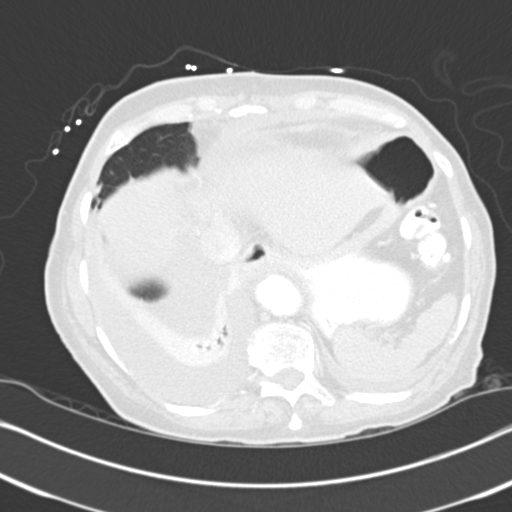
[im 208/223  soft-tissue]
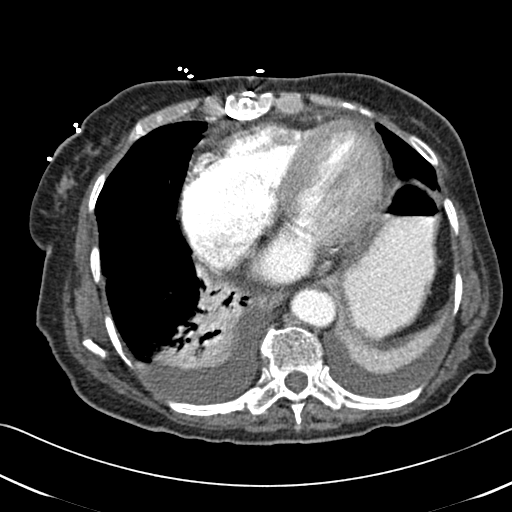
[im 208/223  lung]
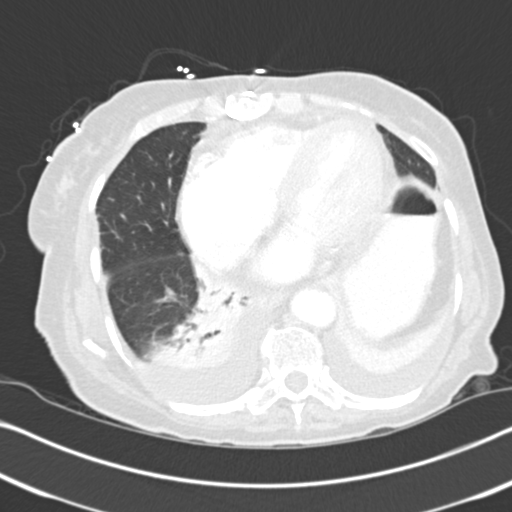
[im 208/223  bone]
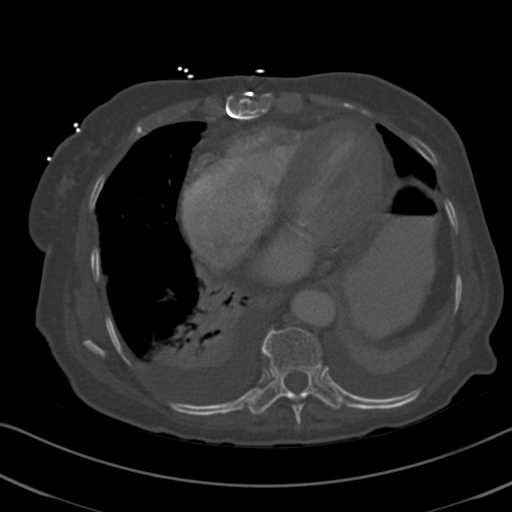

[Series 604: coronal mips · coronal · 0.87mm/px · 2 of 108 slices shown]
[im 36/108  soft-tissue]
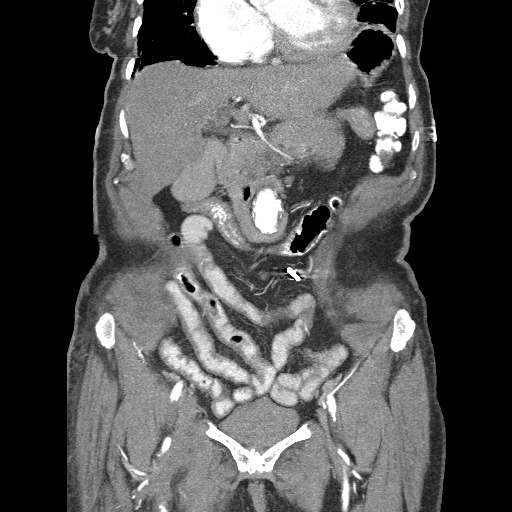
[im 72/108  soft-tissue]
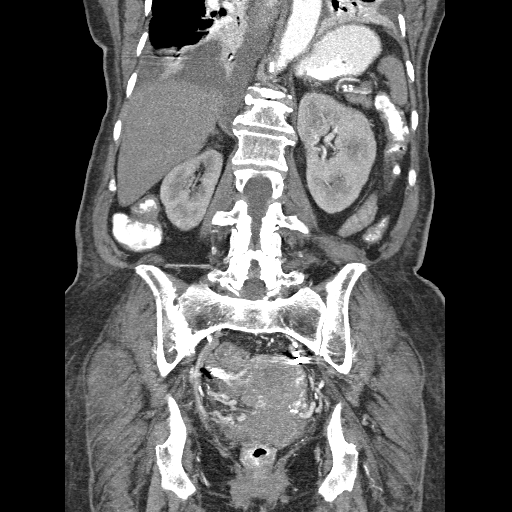

[12 of 46 positions shown; findings below may reference images not displayed]

FINDINGS: There are small to moderate-sized bilateral pleural
effusions with compressive atelectasis in the lower lobes
bilaterally.  Heart is enlarged.  Small pericardial effusion
present.

Tight stenosis at the origin of the celiac artery.  Proximal
superior mesenteric artery is either completely occluded or nearly
occluded.  It is difficult to determine if there may be a string of
contrast passing through the proximal SMA.  There is reconstitution
of the vessel after 1-2 cm.  Proximal inferior mesenteric artery
appears occluded as well, with reconstitution more peripherally.

There are two renal arteries bilaterally.  Suspect at least mild
stenosis at the origin of the main renal arteries bilaterally.
Accessory renal arteries more inferiorly are very small and
difficult to evaluate the origin.

There is wall thickening involving the descending colon, hepatic
flexure, and proximal transverse colon.  Portions of the descending
colon appears thick-walled, but are not well distended and
difficult to evaluate.  There is extensive diverticulosis in the
descending colon.

Marked atherosclerotic irregularity in the infrarenal aorta, which
is mildly aneurysmal at 3.2 cm.  Aorta is heavily calcified
infrarenally.

Contrast material noted within the gallbladder, likely vicarious
excretion.  Liver, spleen, pancreas, adrenals, kidneys unremarkable
small bowel grossly unremarkable.

Mildly prominent borderline sized upper abdominal and
retroperitoneal lymph nodes.  No free fluid or free air.
IMPRESSION: Superior mesenteric artery appears completely occluded proximally,
versus near occlusion with a string sign.  Severe stenosis at the
origin of the celiac artery and inferior mesenteric artery.

Wall thickening within the ascending colon and proximal transverse
colon.  Given the three-vessel vascular disease I favor this is
ischemic.

Marked atherosclerotic irregularity in the infrarenal aorta, which
is mildly aneurysmal at 3.2 cm.

Two renal arteries bilaterally.  At least mild to moderate stenosis
at the origin of both main renal arteries.

Descending colonic diverticulosis.

Small moderate bilateral effusions with compressive atelectasis.

Cardiomegaly.

CTA PELVIS
FINDINGS: Common iliac arteries are heavily calcified, but patent.
External iliac arteries and common femoral arteries are patent
bilaterally.  Calcified plaques seen in the common femoral arteries
bilaterally.  Both internal iliac arteries are patent.

Extensive diverticular disease in the distal descending colon and
proximal sigmoid colon.  Pelvic small bowel appears mildly thick-
walled in the right lower quadrant.  I cannot completely exclude
ischemic change in the distal small bowel.

Uterus and adnexa grossly unremarkable.  No free fluid, or free
air.  3.4 cm hematoma in the right groin pain.  Question recent
catheterization.  Foley catheters in the bladder.
IMPRESSION: Heavily calcified iliofemoral vessels without aneurysm or stenosis.

Diverticulosis.

Mild wall thickening in the small bowel within the right lower
quadrant, cannot exclude ischemic change.

## 2008-02-08 IMAGING — CR DG CHEST 1V PORT
1 series · 1 of 1 positions shown · non-contrast
Comparison: [DATE]

CLINICAL DATA: Angina and pneumonia.

PORTABLE CHEST - 1 VIEW

[view not recorded]
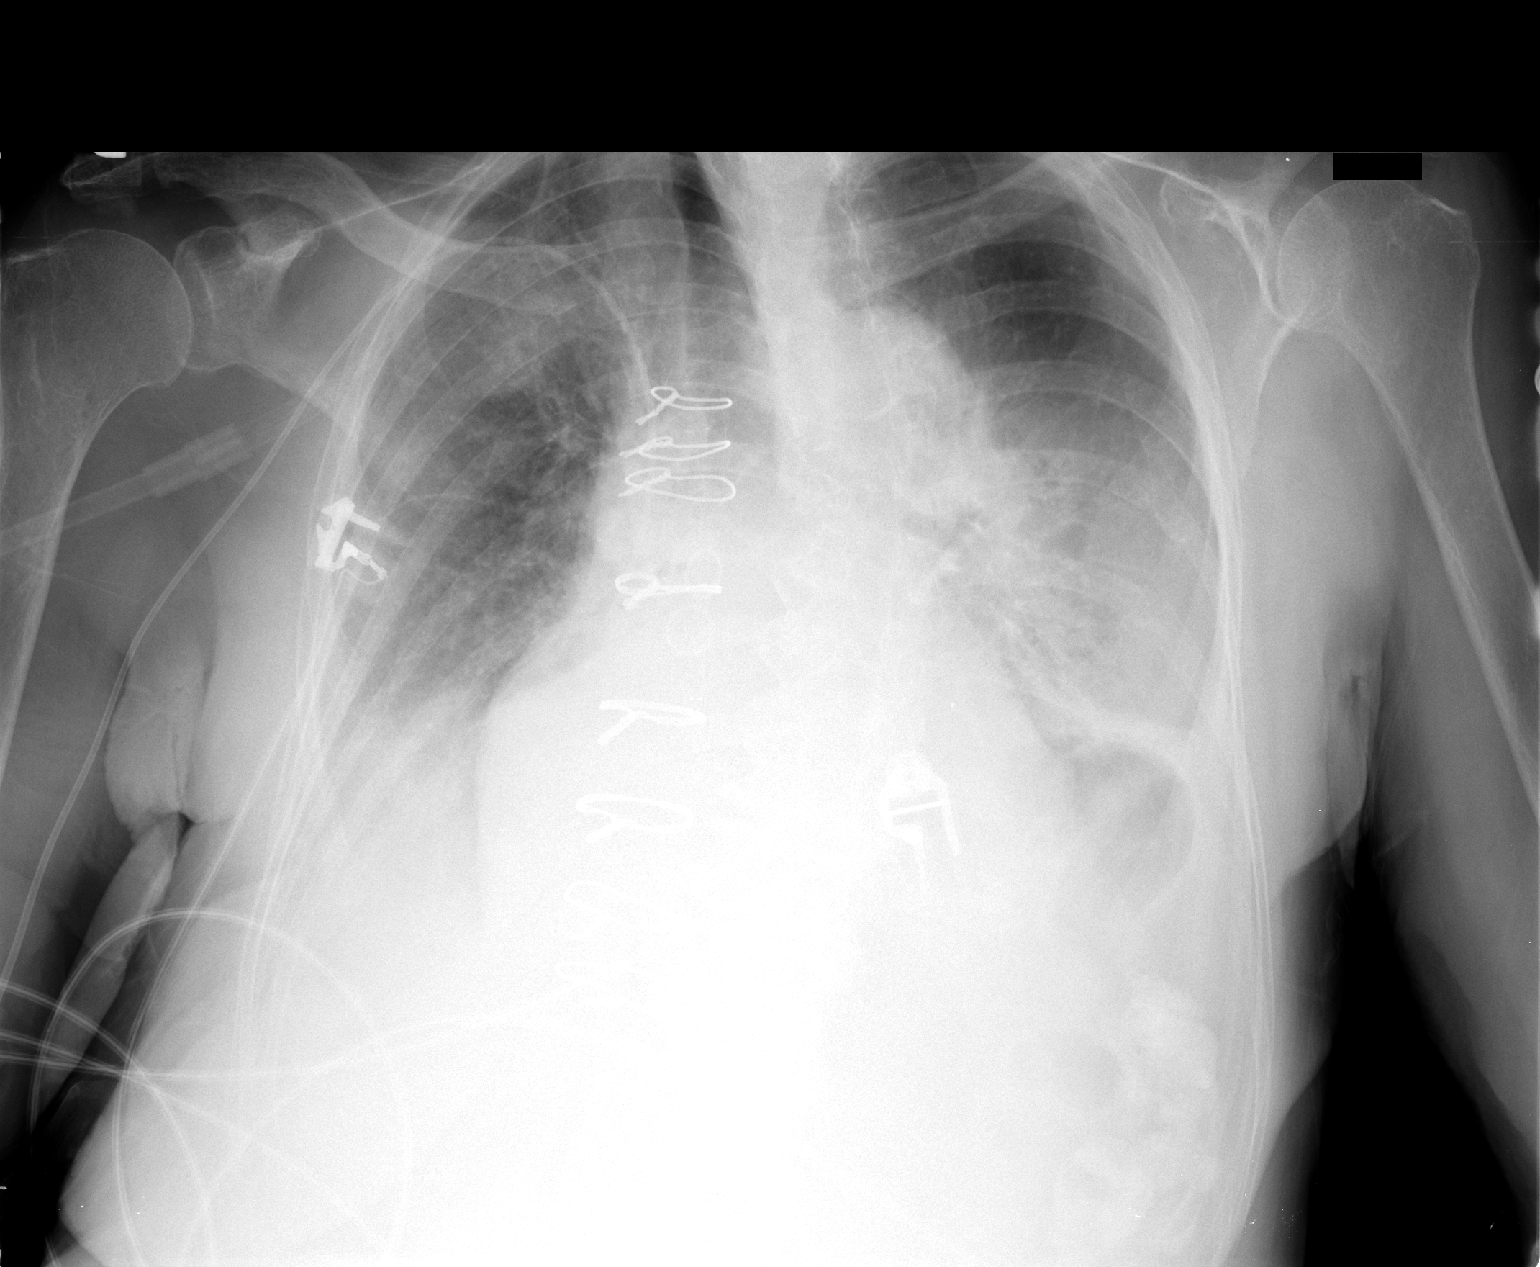

[1 of 1 positions shown; findings below may reference images not displayed]

FINDINGS: Portable view of the chest demonstrates a right PICC line
that terminates in the SVC.  The patient has bibasilar
opacifications suggestive for atelectasis and pleural fluid.  The
patient is status post median sternotomy.  There is elevation of
the left hemidiaphragm.
IMPRESSION: Persistent bibasilar opacifications as described.

## 2008-02-08 IMAGING — CR DG CHEST 1V PORT
1 series · 1 of 1 positions shown · non-contrast
Comparison: [DATE].

CLINICAL DATA: Central line placement.

PORTABLE CHEST - 1 VIEW

[view not recorded]
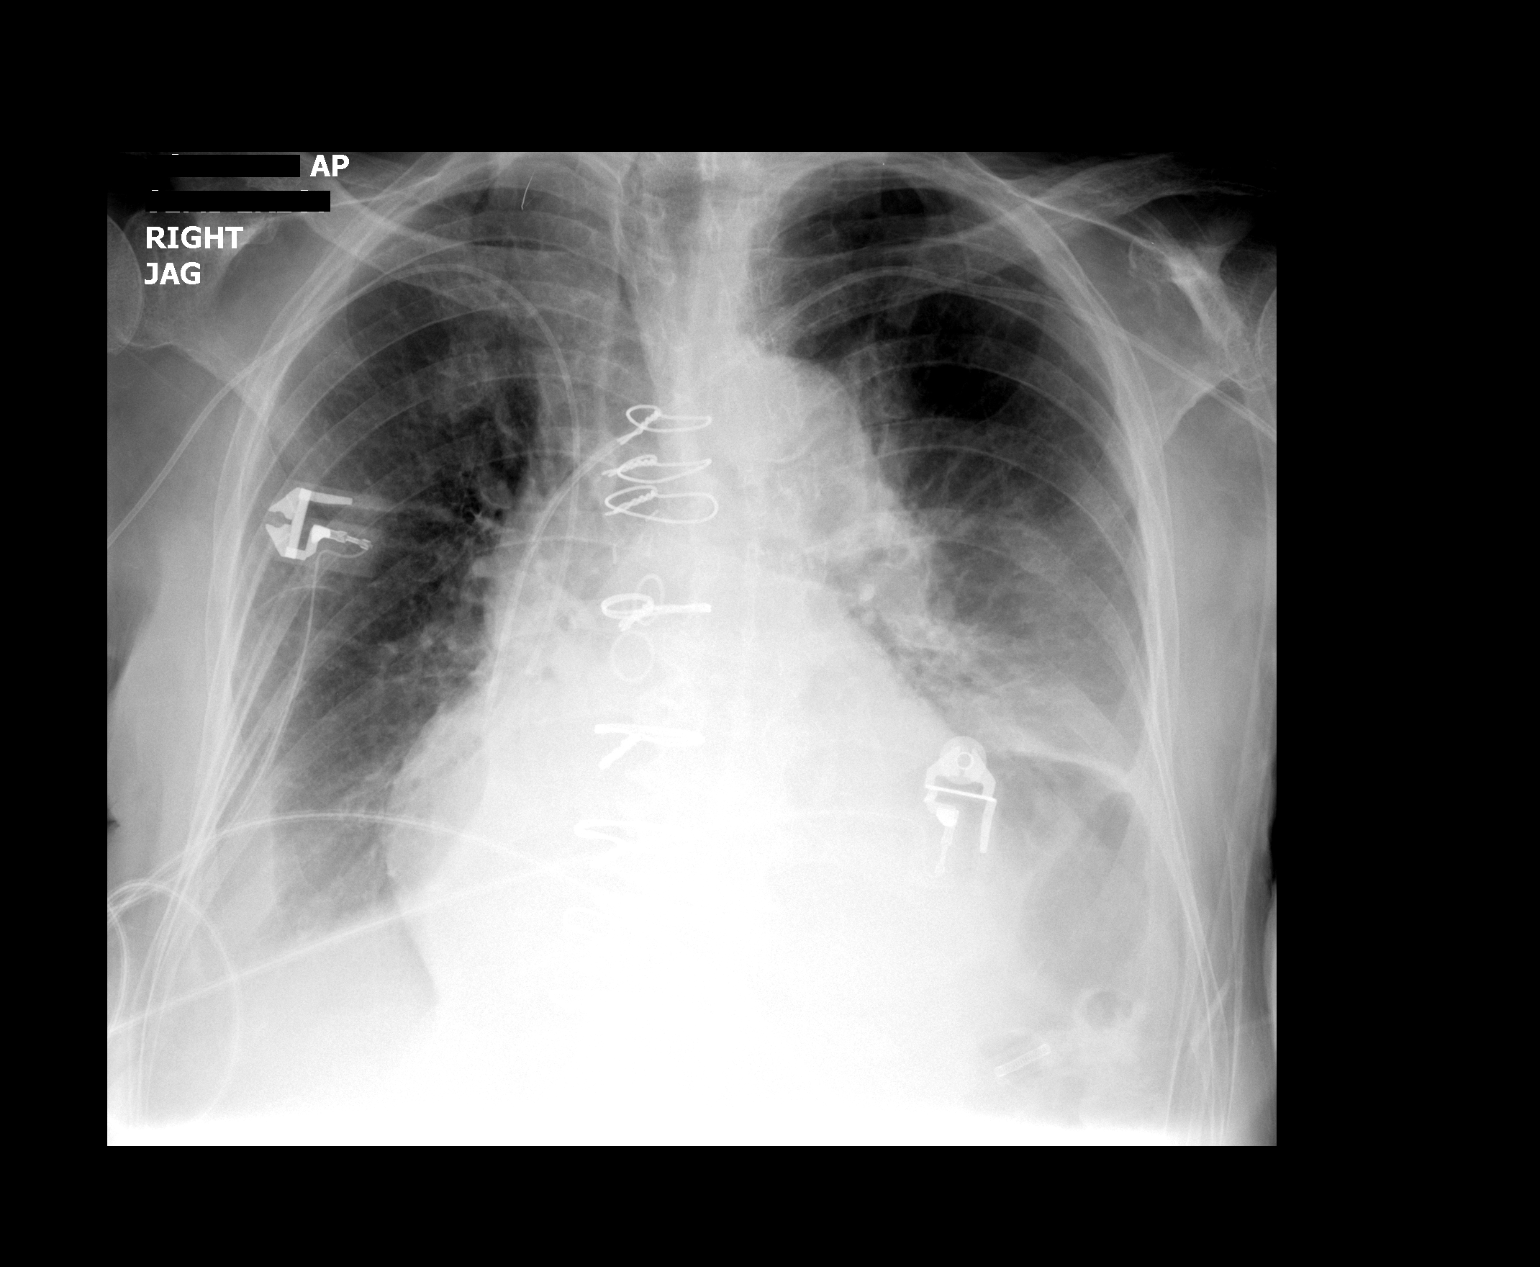

[1 of 1 positions shown; findings below may reference images not displayed]

FINDINGS: [D4] hours.  There is a new left subclavian central
venous catheter with its tip near the SVC right atrial junction.
The right arm PICC remains in place.  There is no pneumothorax.
Bilateral pleural effusions, bibasilar atelectasis and probable
mild edema persist.  The overall pulmonary aeration has improved
from examination done earlier today.  The heart size and
mediastinal contours appear stable post CABG.
IMPRESSION: Left subclavian central venous catheter tip at the SVC right atrial
junction.  No pneumothorax.

## 2008-02-09 IMAGING — CR DG CHEST 1V PORT
1 series · 1 of 1 positions shown · non-contrast
Comparison: the previous day's study

CLINICAL DATA: Angina

PORTABLE CHEST - 1 VIEW

[AP]
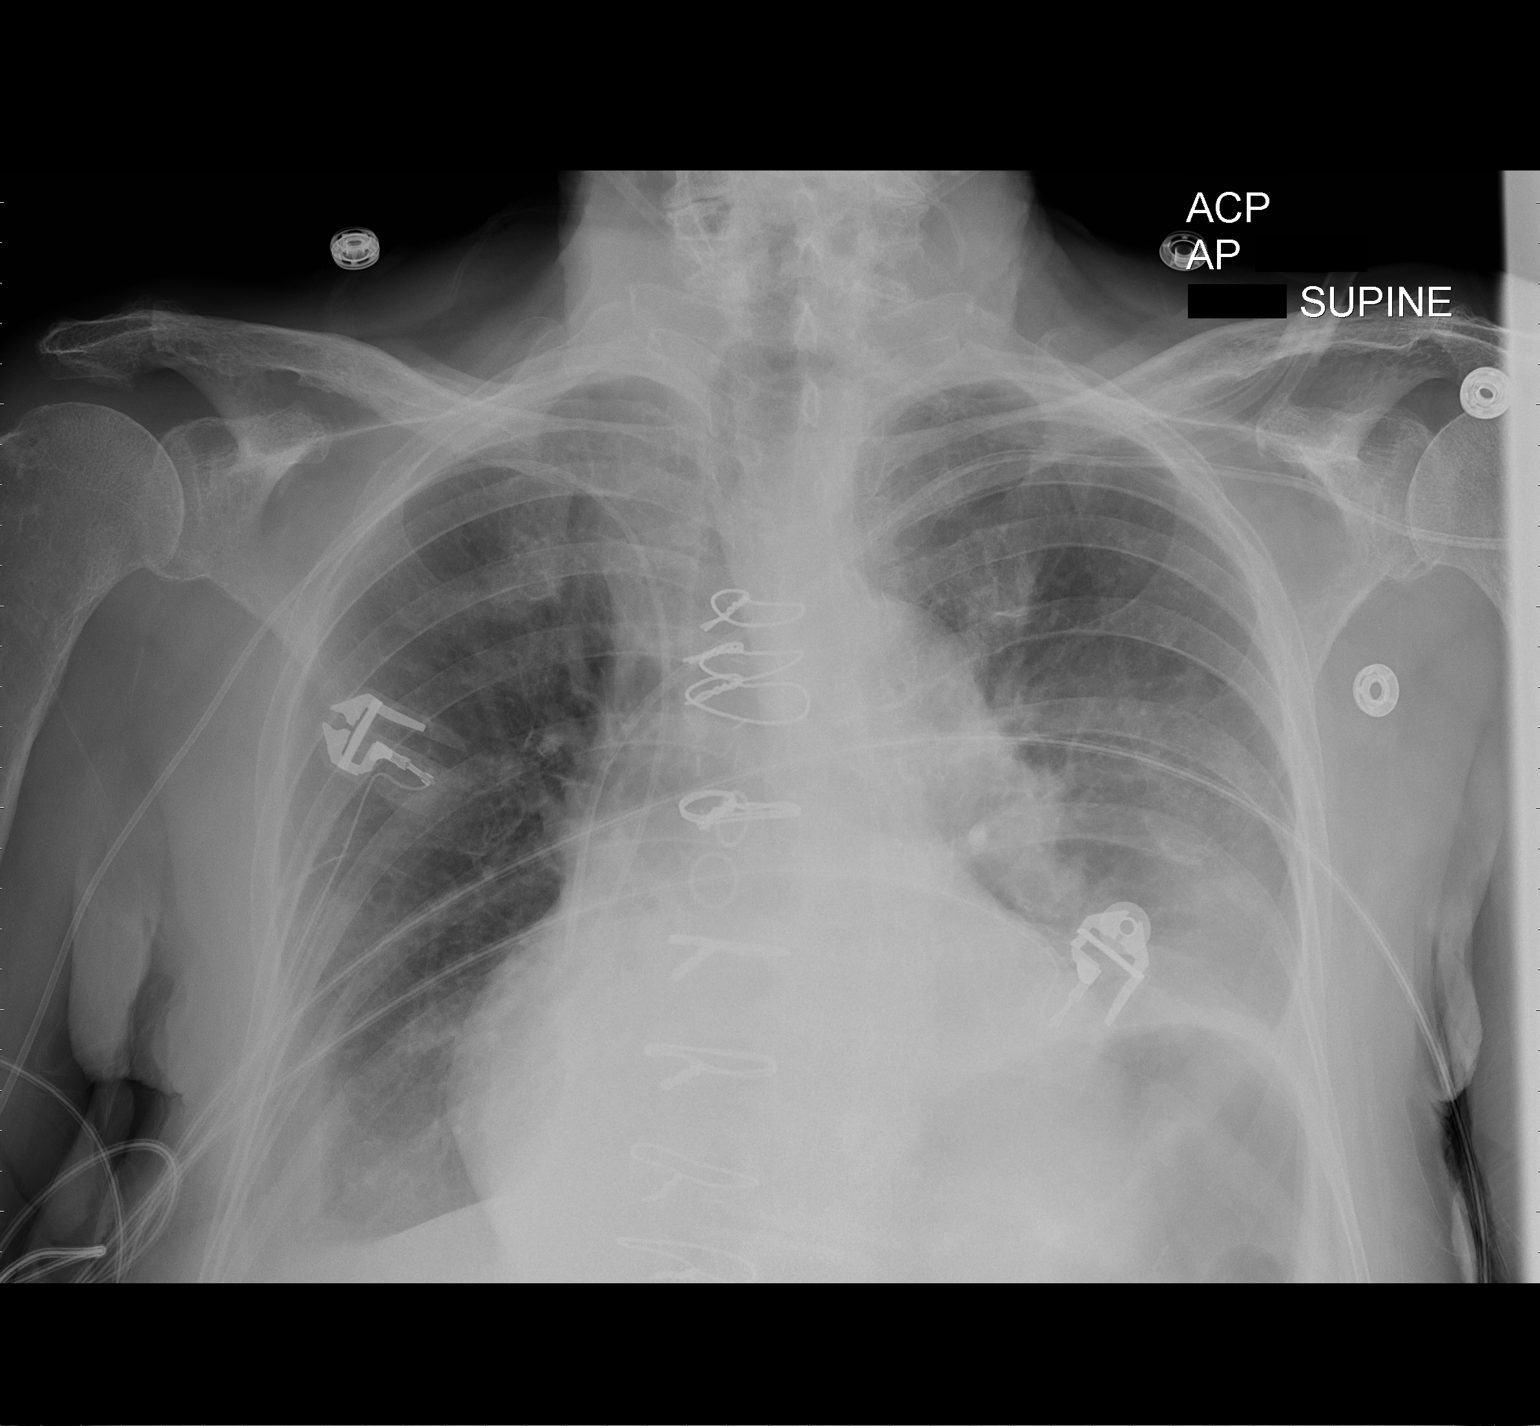

[1 of 1 positions shown; findings below may reference images not displayed]

FINDINGS: Right arm PICC and left subclavian central line stable
position.  Previous CABG.  Persistent elevation of the left
diaphragmatic leaflet.  Probable small pleural effusions with some
associated atelectasis versus consolidation in the lung bases, left
greater than right.  Mild central pulmonary vascular congestion as
before.  Stable mild cardiomegaly.
IMPRESSION: Little change compared to previous day's portable exam

## 2008-02-12 IMAGING — CR DG ABD PORTABLE 1V
1 series · 1 of 1 positions shown · non-contrast
Comparison: [DATE]

CLINICAL DATA: Mild dysfunction

ABDOMEN - 1 VIEW

[AP]
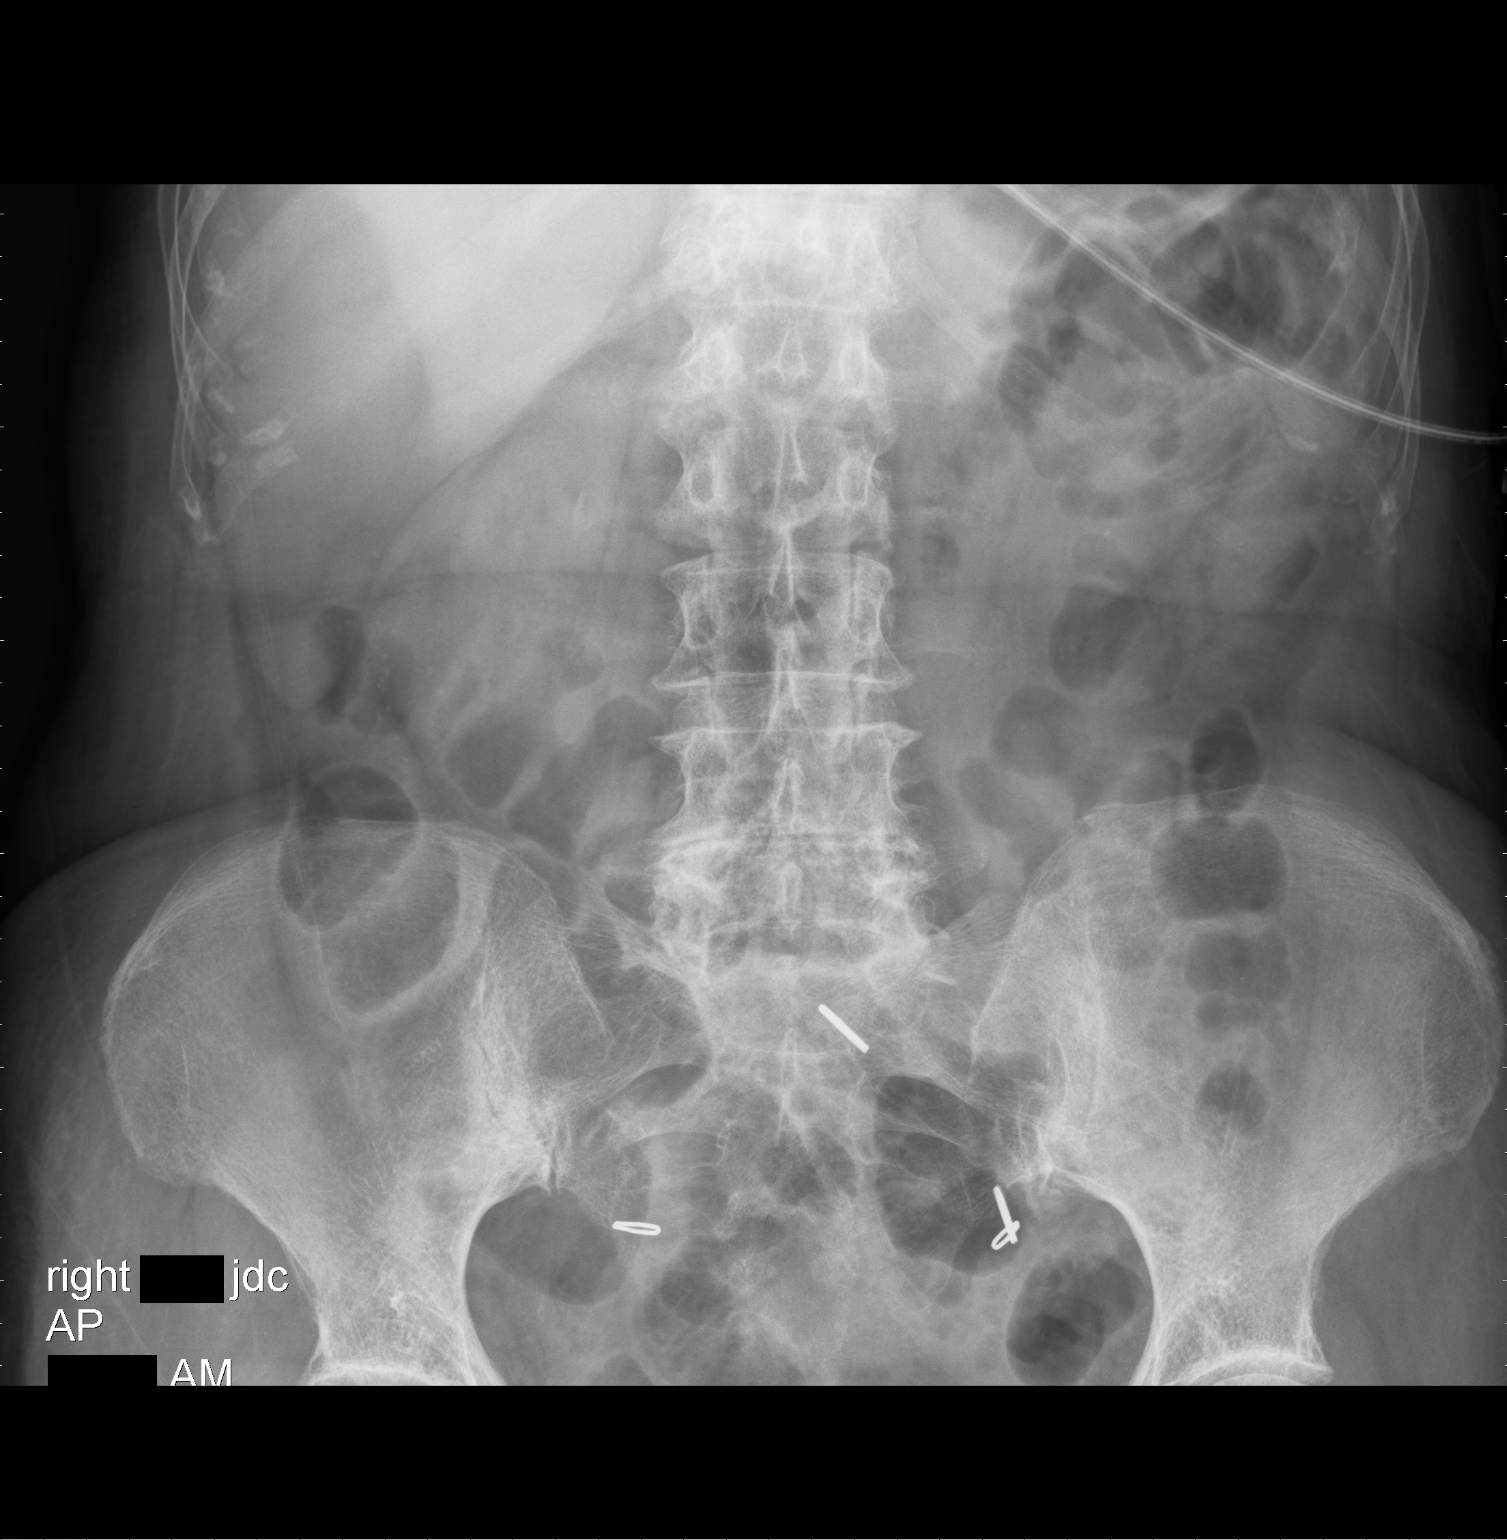

[1 of 1 positions shown; findings below may reference images not displayed]

FINDINGS: No acute or specific abnormality of the bowel gas
pattern.  Psoas margins intact.  No pathological calcifications.
Surgical clips noted in the pelvis.
IMPRESSION: No acute or specific findings in one-view.

## 2008-02-12 IMAGING — CR DG CHEST 1V PORT
1 series · 1 of 1 positions shown · non-contrast
Comparison: [DATE]

CLINICAL DATA: Follow-up chest status.  Pneumonia.

PORTABLE CHEST - 1 VIEW

[view not recorded]
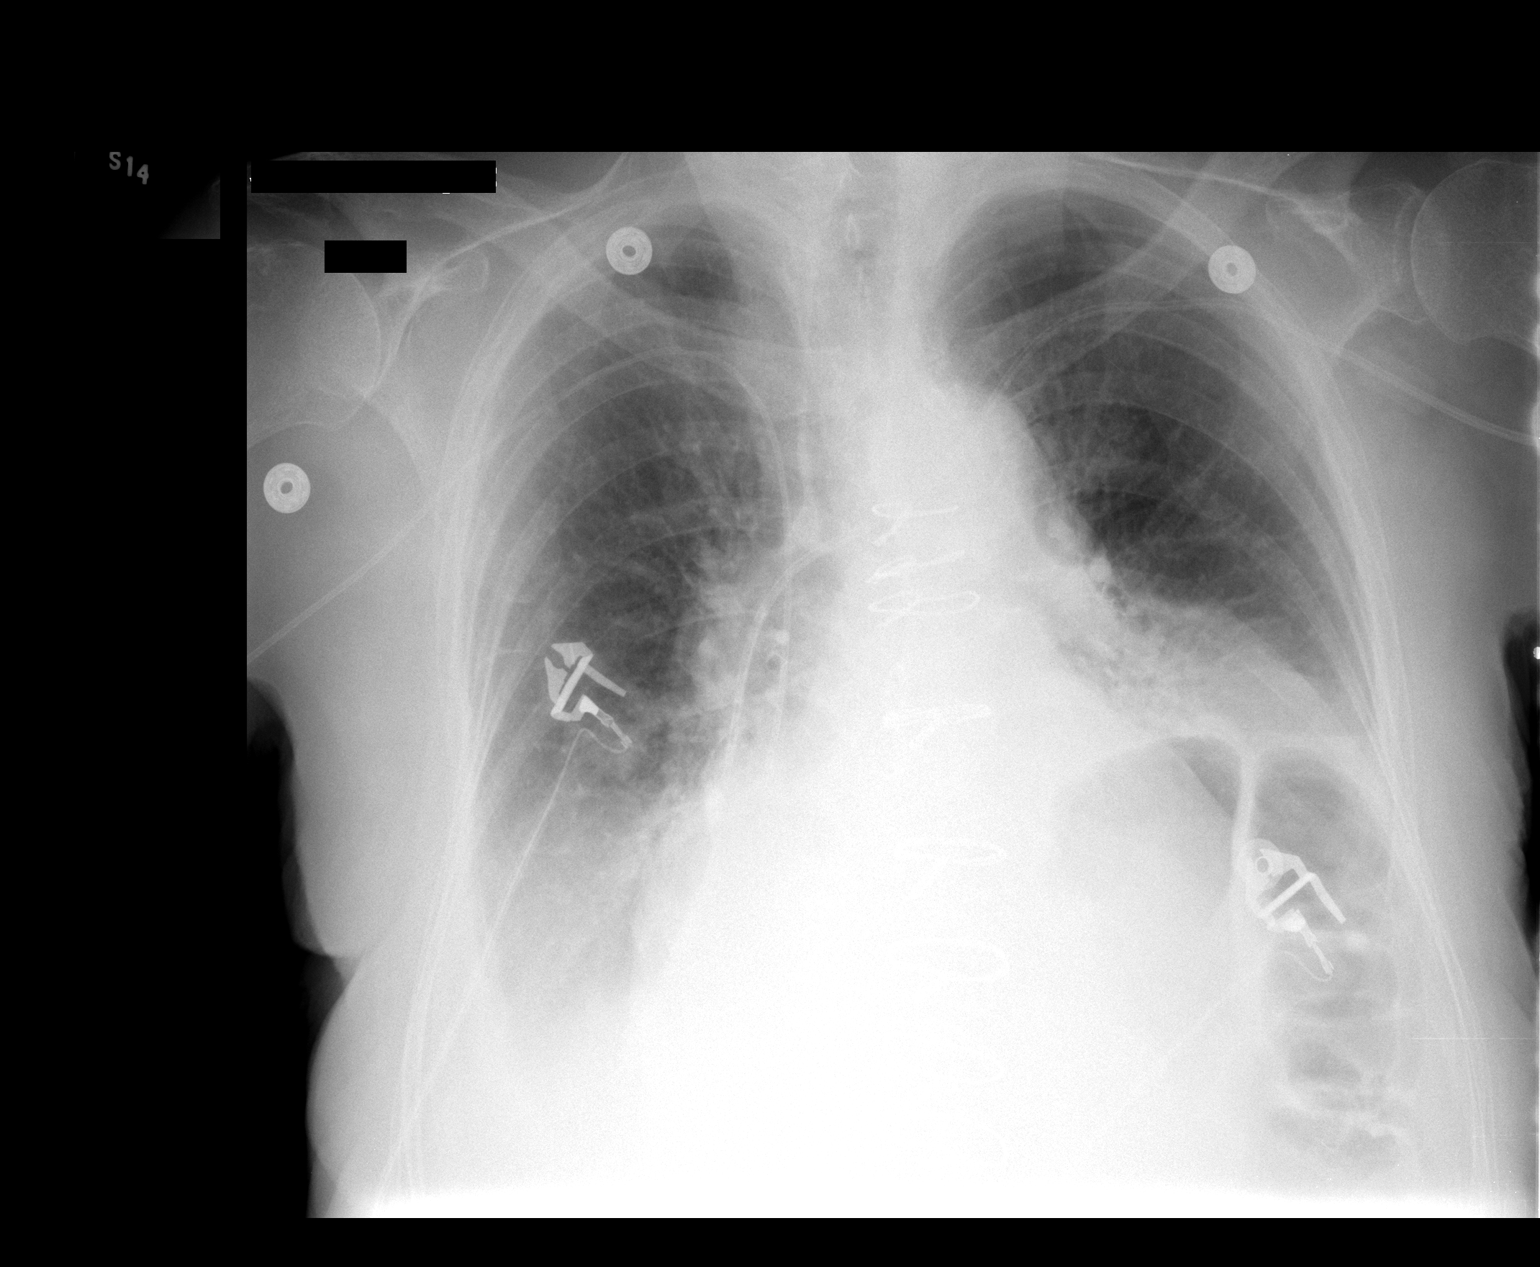

[1 of 1 positions shown; findings below may reference images not displayed]

FINDINGS: Again appreciated is a small right pleural effusion and
bilateral retrocardiac density compatible with atelectasis /
pneumonia.  Cardiomegaly and pulmonary vascular congestion.  No
florid pulmonary edema.  PICC line unchanged position.  Left
subclavian CVC is in the lower SVC.
IMPRESSION: Right pleural effusion and bibasilar atelectasis / pneumonia are
again noted.  Pulmonary vascular congestion without edema.

## 2008-02-27 ENCOUNTER — Ambulatory Visit: Payer: Self-pay | Admitting: Vascular Surgery

## 2008-03-12 ENCOUNTER — Ambulatory Visit: Payer: Self-pay | Admitting: Vascular Surgery

## 2008-03-13 ENCOUNTER — Ambulatory Visit: Payer: Self-pay | Admitting: Cardiology

## 2008-03-18 ENCOUNTER — Encounter: Admission: RE | Admit: 2008-03-18 | Discharge: 2008-03-18 | Payer: Self-pay | Admitting: Cardiothoracic Surgery

## 2008-03-18 ENCOUNTER — Ambulatory Visit: Payer: Self-pay | Admitting: Cardiothoracic Surgery

## 2008-03-18 IMAGING — CR DG CHEST 2V
2 series · 2 of 2 positions shown · non-contrast
Comparison: [HOSPITAL] chest x-ray [DATE], [DATE]
and [DATE].

CLINICAL DATA: 3 months post CABG.

CHEST - 2 VIEW

[w chest pa]
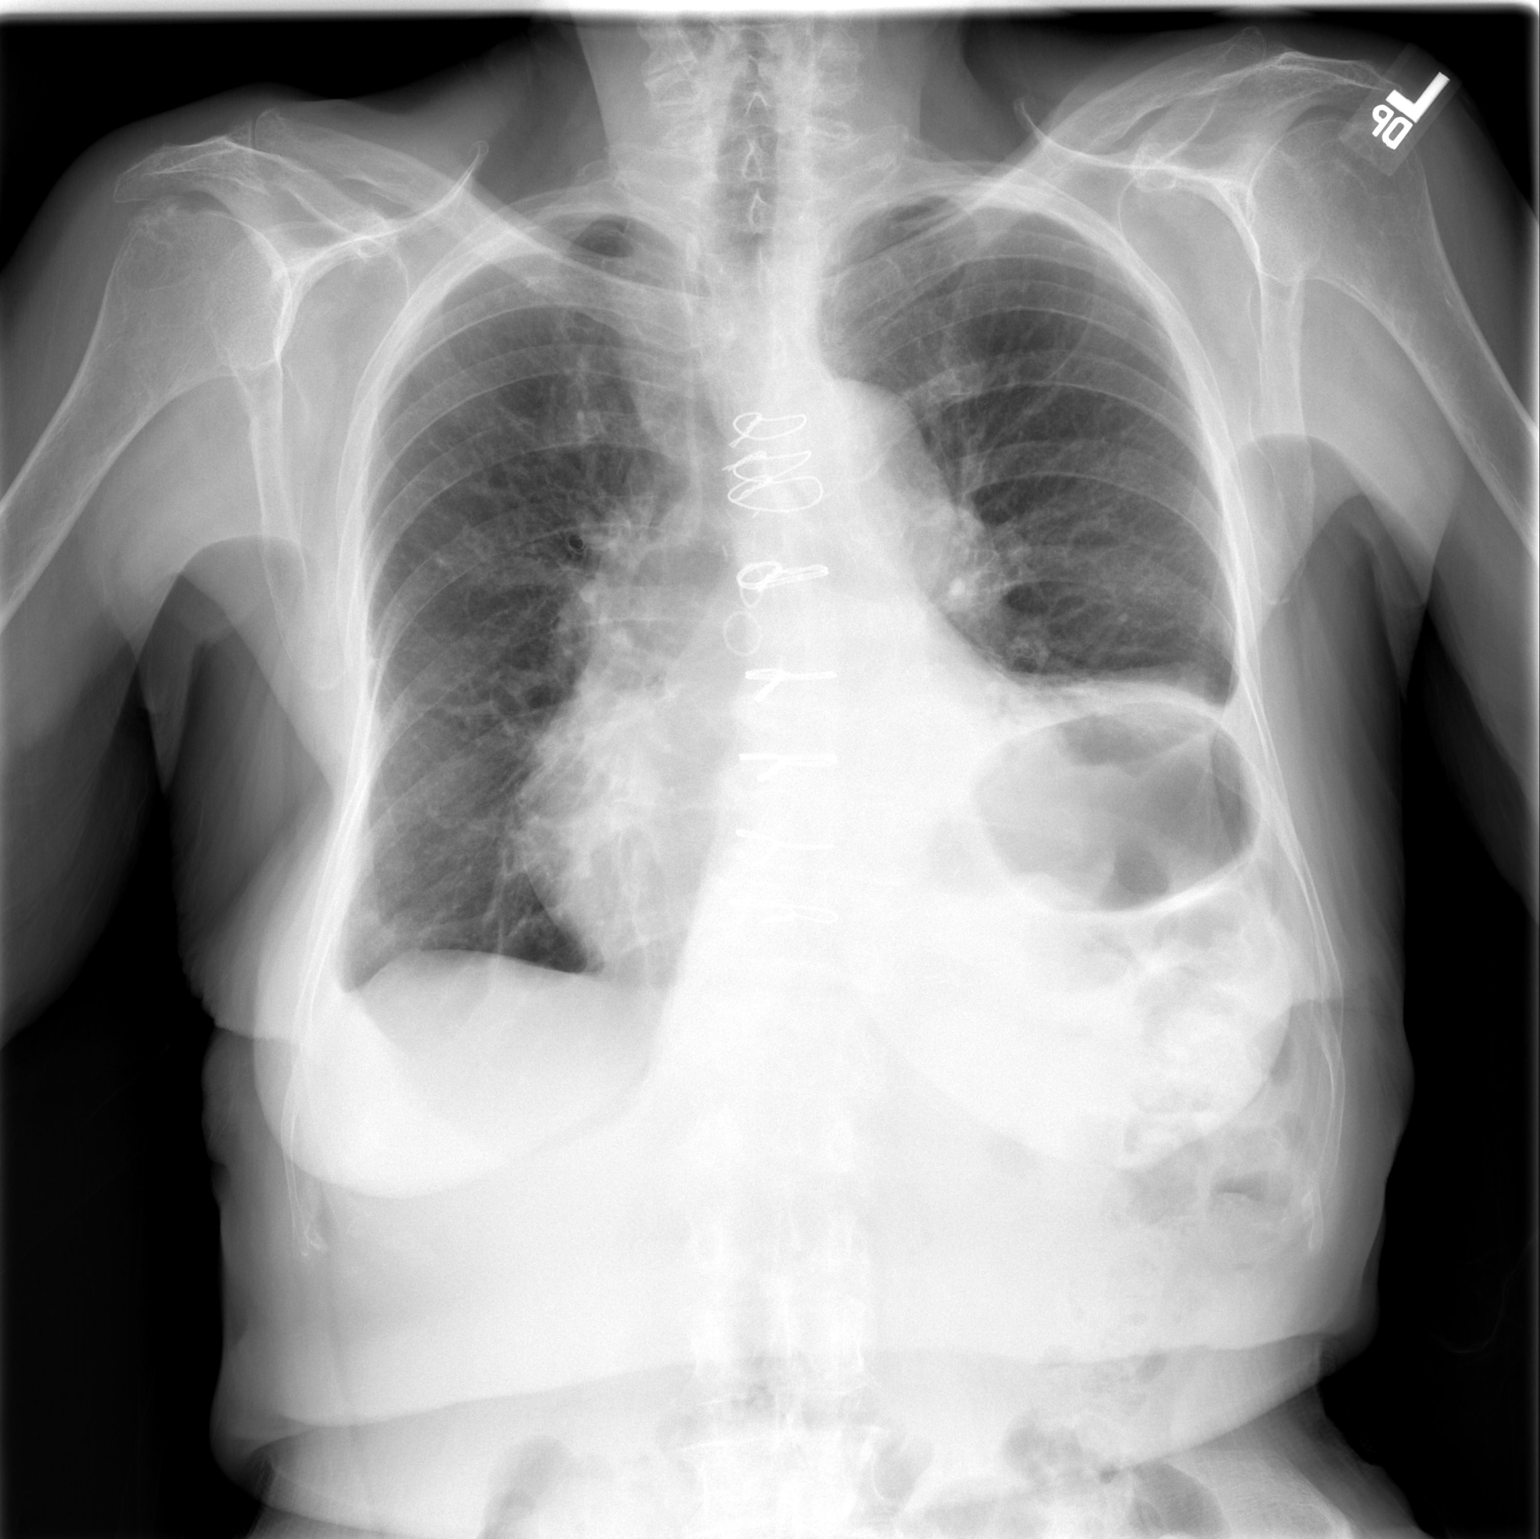

[w chest lat]
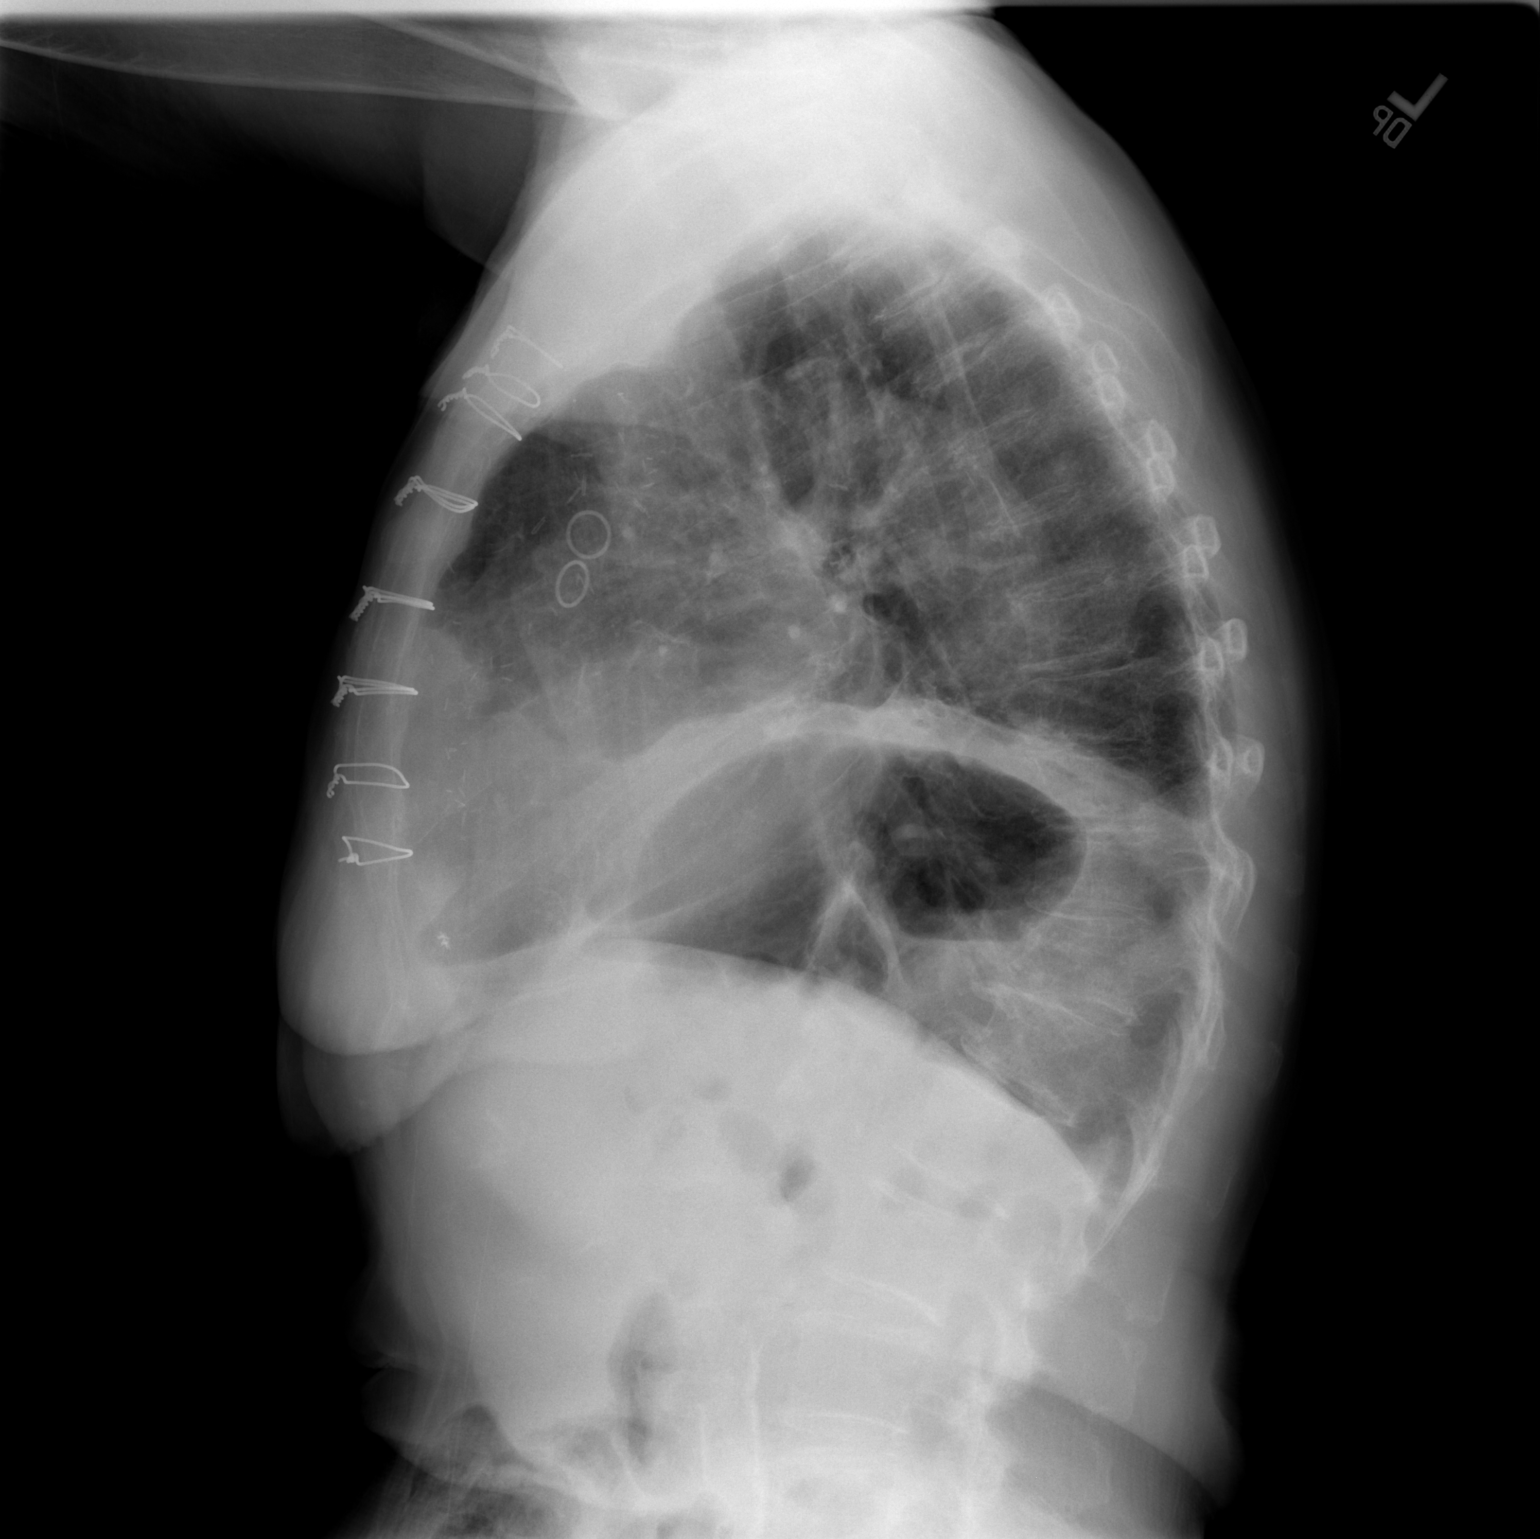

[2 of 2 positions shown; findings below may reference images not displayed]

FINDINGS: Probably stable since [DATE] and new finding since
prior chest x-rays, is elevation left hemidiaphragm.  Resolving
currently tiny bilateral pleural effusions or lateral pleural
blunting noted.  Lungs are otherwise clear.  Stable cardiomegaly
post CABG change visualized.  No other new significant
abnormalities seen.
IMPRESSION: 1.  Elevated left hemidiaphragm as described.
2.  Minimal bilateral pleural effusions or pleural thickening at
the lateral costophrenic angles.
3.  Stable cardiomegaly post CABG.
4.  No additional acute findings.

## 2008-03-30 IMAGING — CR DG CHEST 2V
2 series · 2 of 2 positions shown · non-contrast
Comparison: None

CLINICAL DATA: Angina, preoperative assessment for CABG

CHEST - 2 VIEW

[w chest lat]
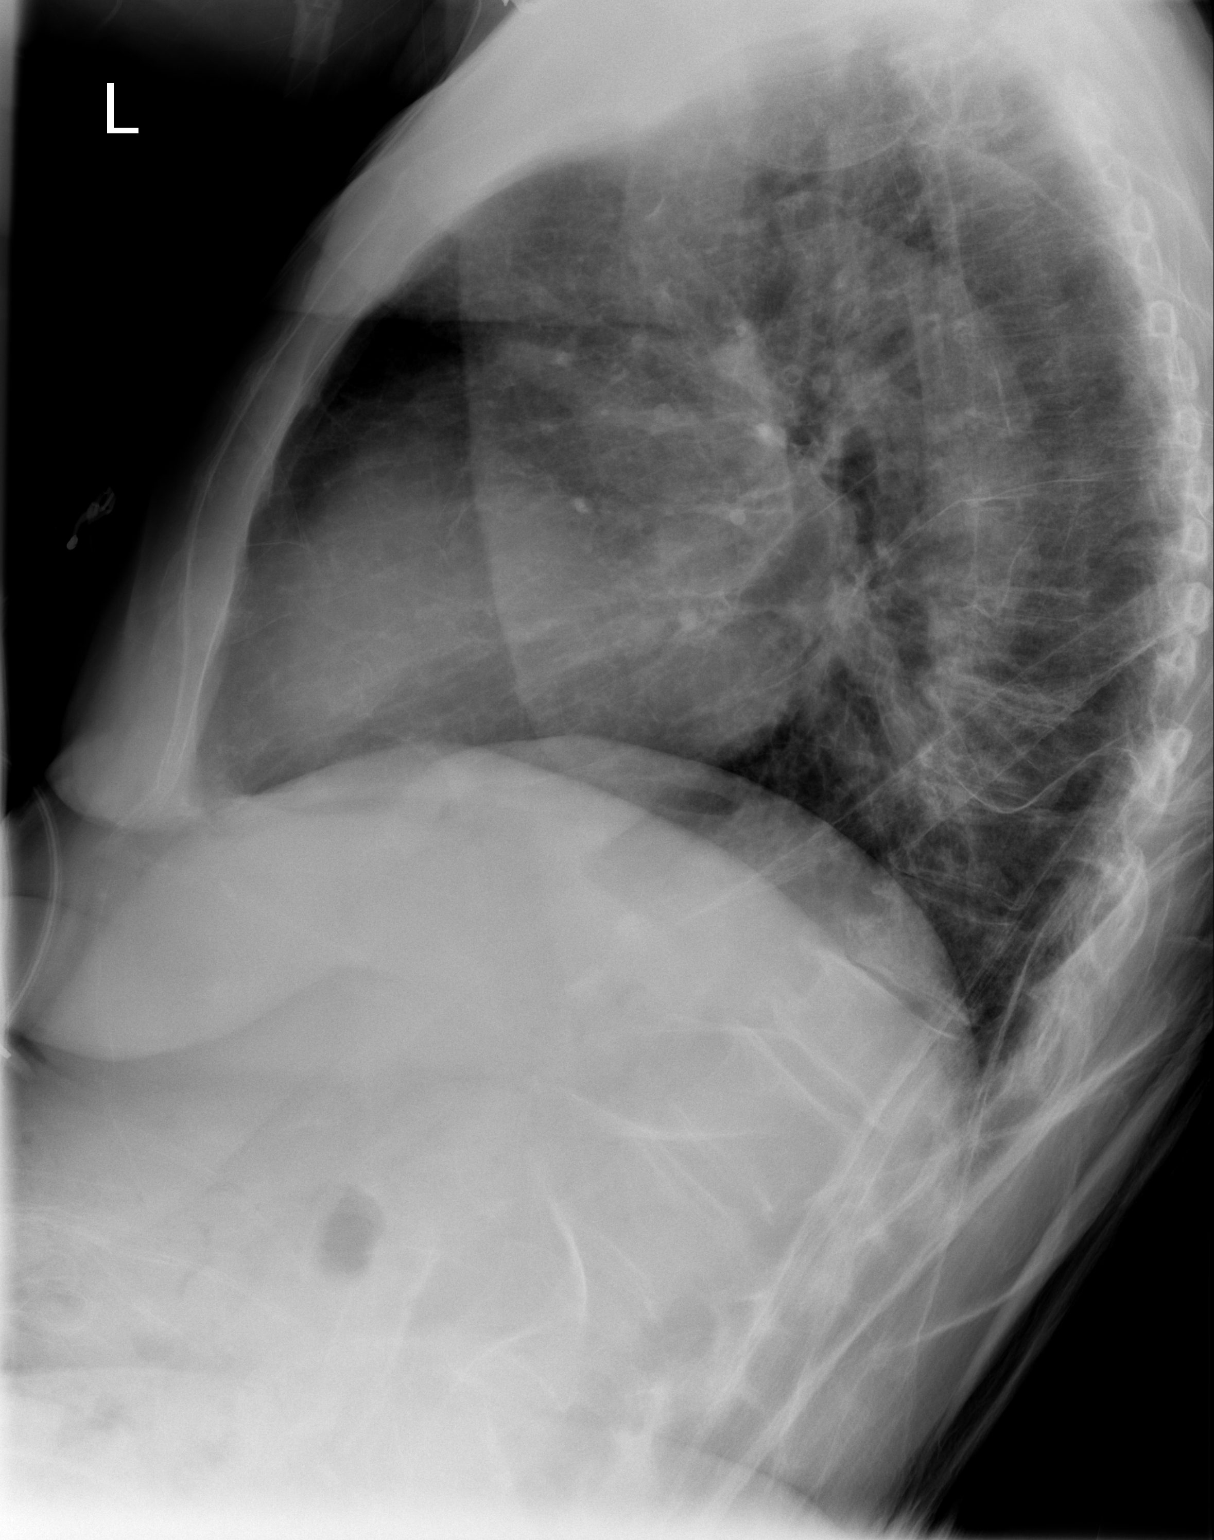

[view not recorded]
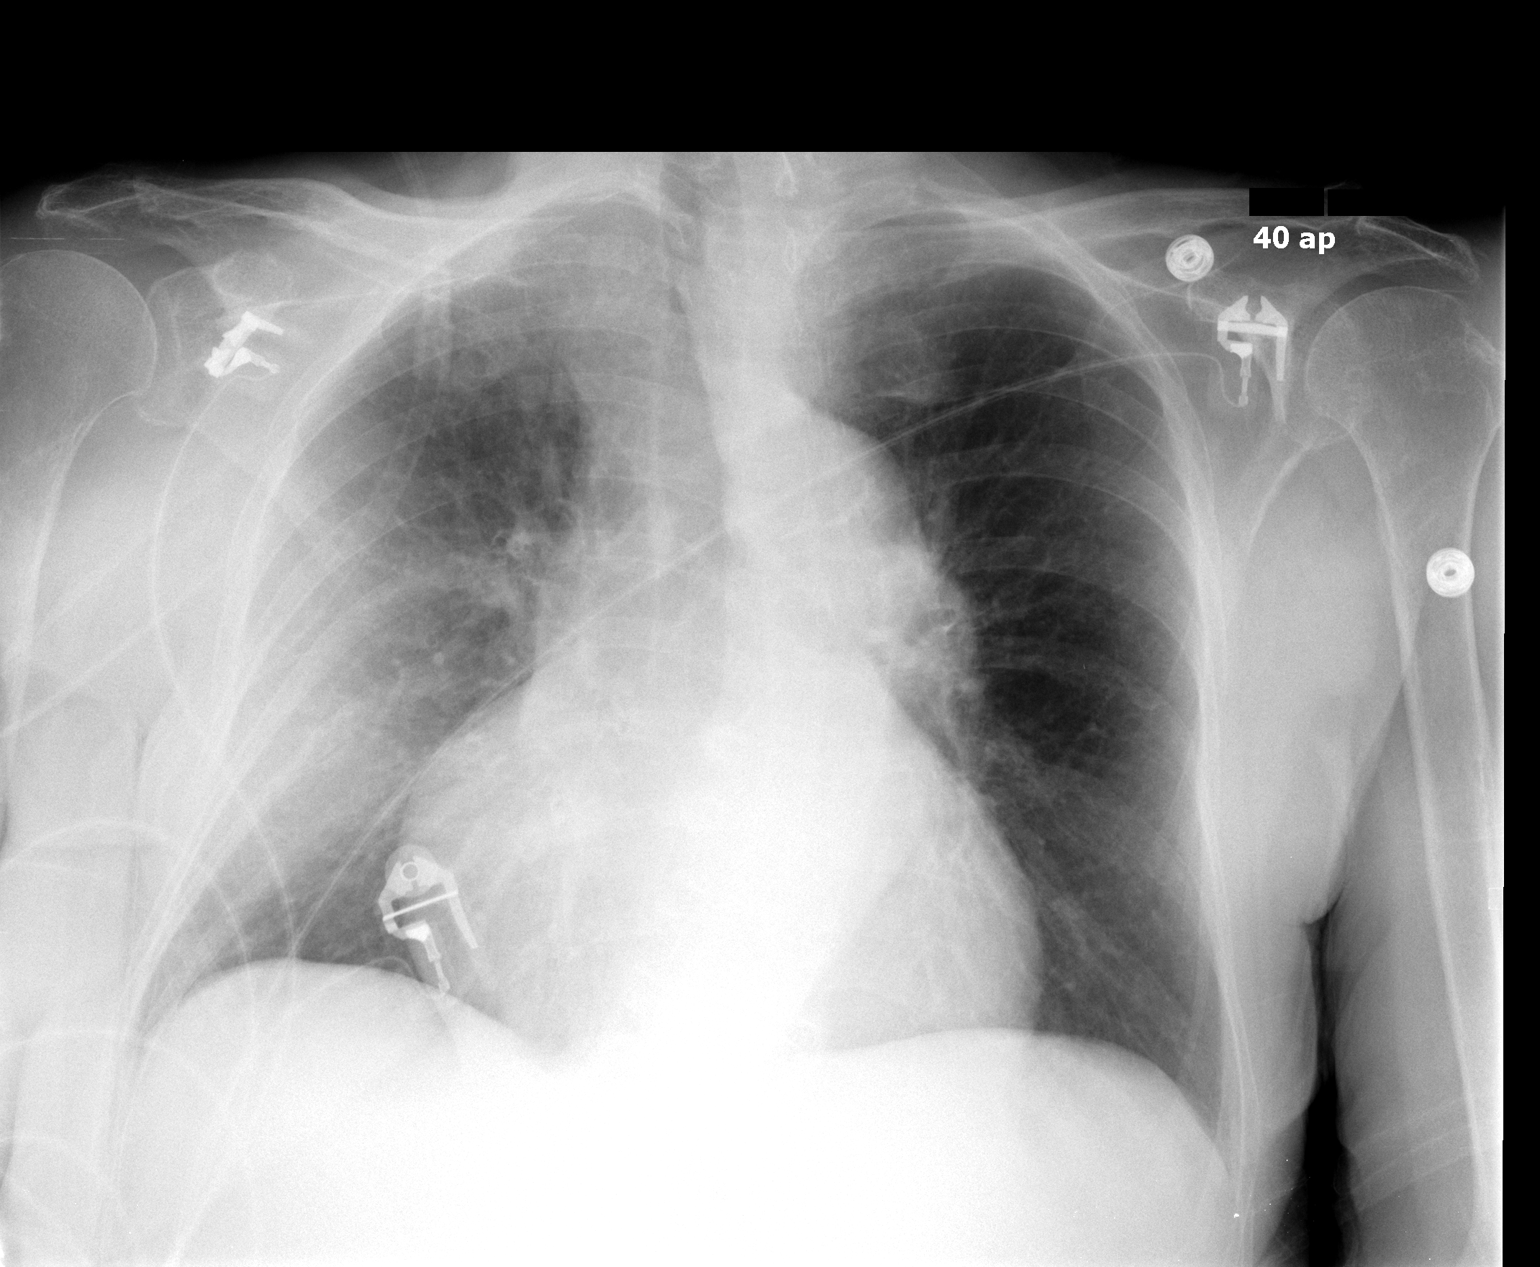

[2 of 2 positions shown; findings below may reference images not displayed]

FINDINGS: Cardiac enlargement.
Levoconvex thoracic scoliosis.
Calcified aortic arch.
Slight pulmonary vascular congestion.
No acute failure consolidation.
Density in right hemithorax may be related to slight patient
rotation.
Bony demineralization.
Minimal bronchitic changes.
IMPRESSION: Cardiomegaly with slight pulmonary vascular congestion.
Minimal bronchitic changes.

## 2008-04-16 ENCOUNTER — Ambulatory Visit: Payer: Self-pay | Admitting: Vascular Surgery

## 2008-04-28 ENCOUNTER — Ambulatory Visit: Payer: Self-pay | Admitting: Cardiology

## 2008-04-28 ENCOUNTER — Inpatient Hospital Stay (HOSPITAL_COMMUNITY): Admission: EM | Admit: 2008-04-28 | Discharge: 2008-04-30 | Payer: Self-pay | Admitting: Emergency Medicine

## 2008-04-28 ENCOUNTER — Encounter (INDEPENDENT_AMBULATORY_CARE_PROVIDER_SITE_OTHER): Payer: Self-pay | Admitting: *Deleted

## 2008-04-28 LAB — CONVERTED CEMR LAB: TSH: 7.135 microintl units/mL

## 2008-04-28 IMAGING — CR DG CHEST 2V
2 series · 2 of 2 positions shown · non-contrast
Comparison: [DATE].

CLINICAL DATA: Shortness of breath.  Fell.

CHEST - 2 VIEW

[w chest pa]
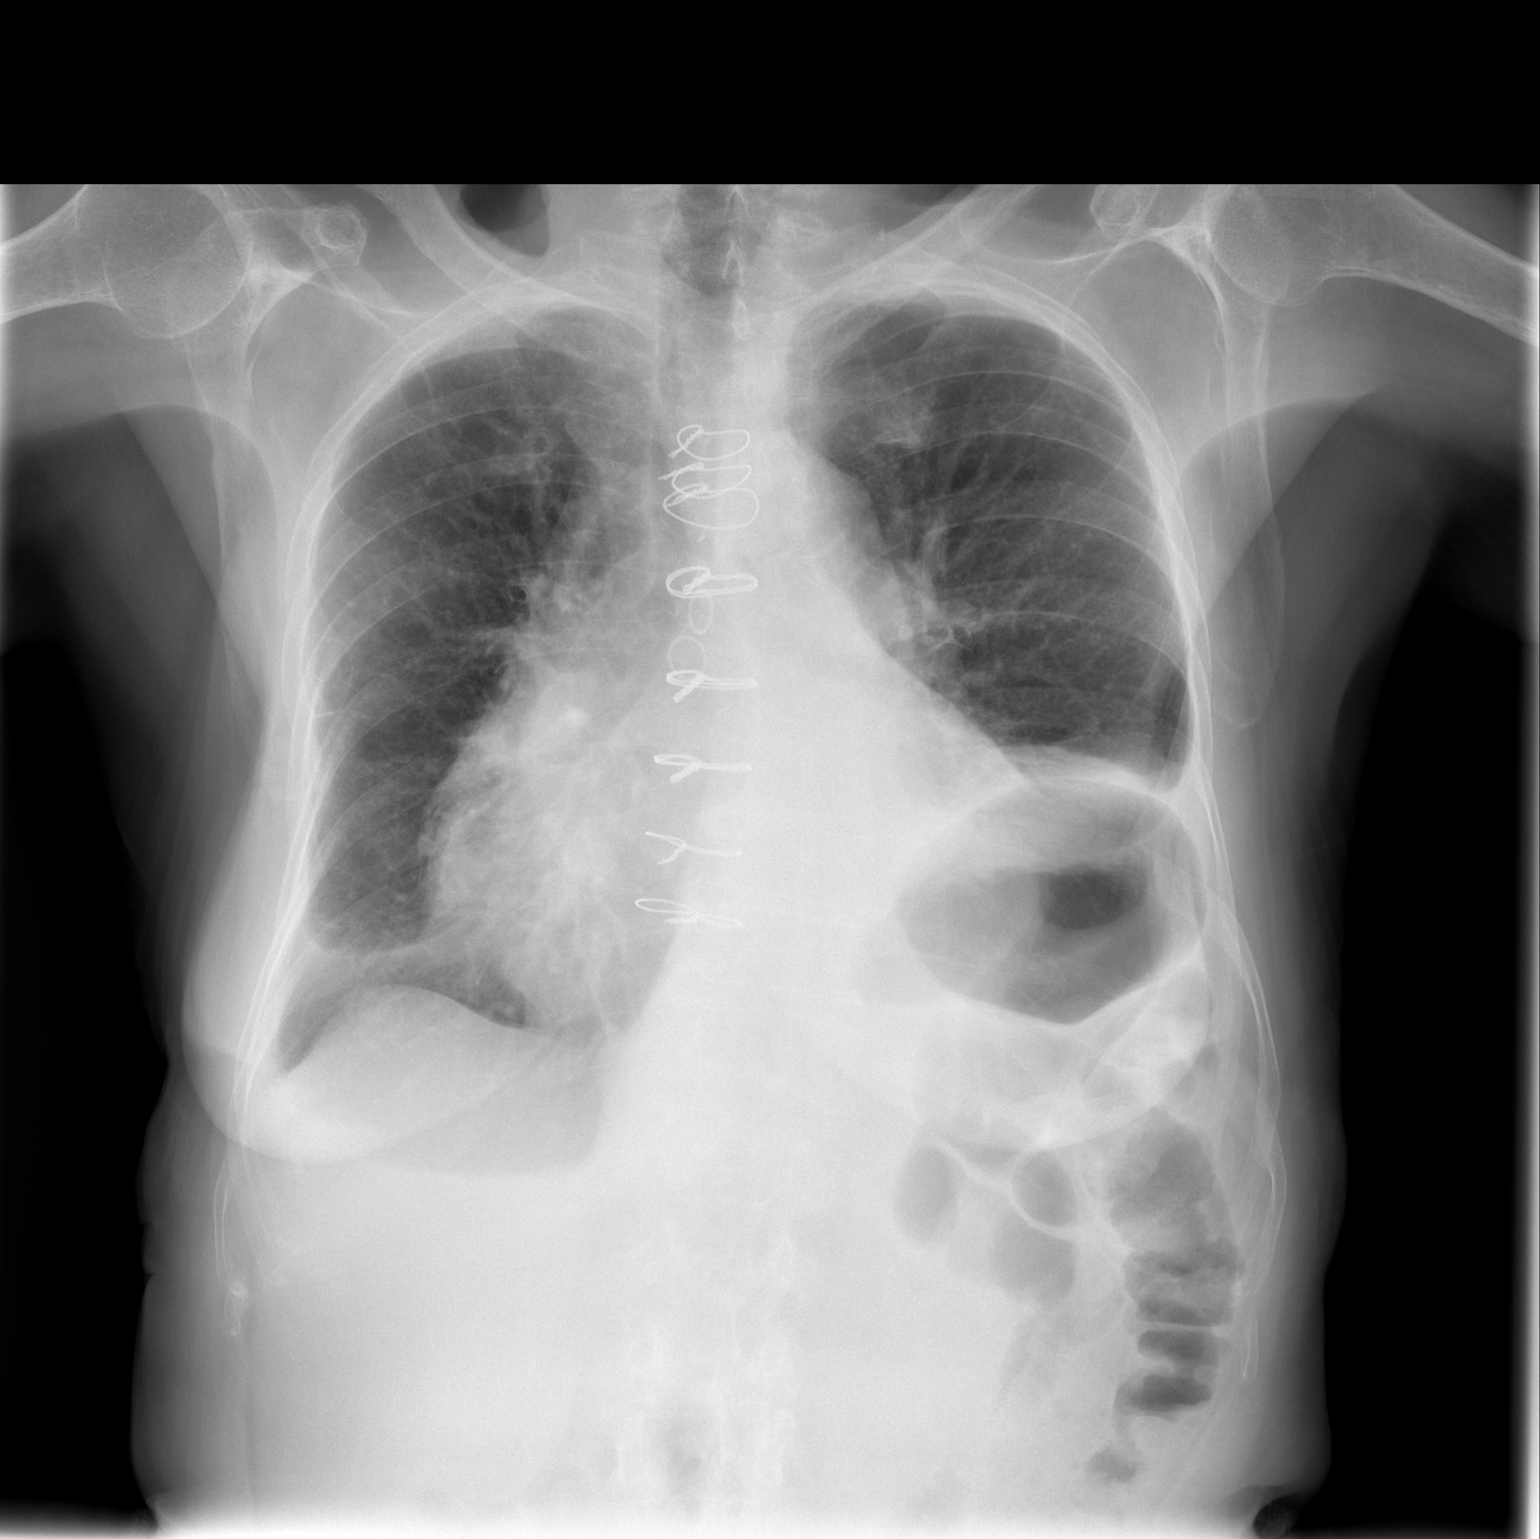

[w chest lat]
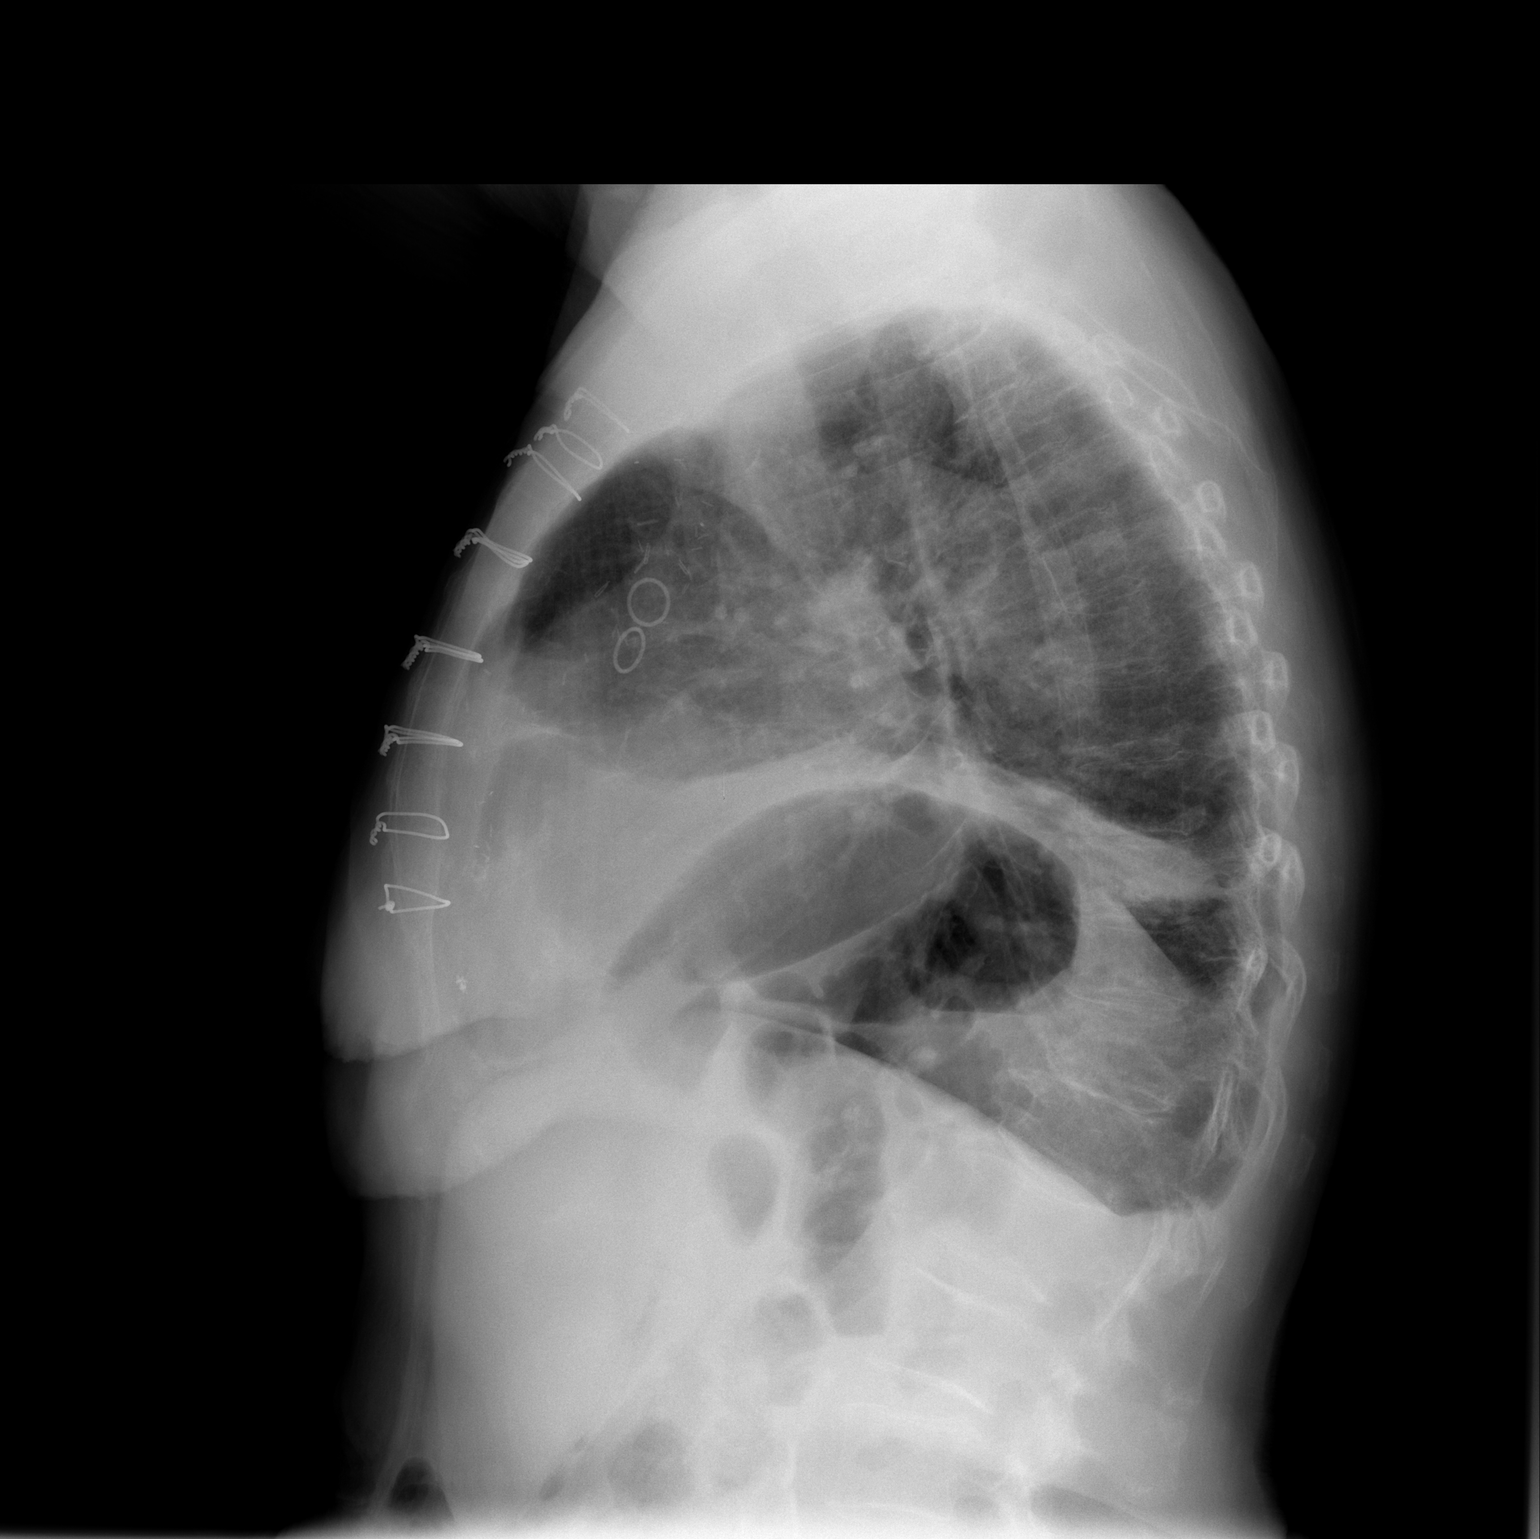

[2 of 2 positions shown; findings below may reference images not displayed]

FINDINGS: Stable enlarged cardiac silhouette and post CABG changes.
Stable elevated left hemidiaphragm and adjacent atelectasis or
scarring.  Stable mild right basilar scarring and mild changes of
COPD and chronic bronchitis.  No significant change in small
bilateral pleural effusions.  Diffuse osteopenia and lower thoracic
spine degenerative changes.  Mild scoliosis.
IMPRESSION: 1.  Stable elevated left hemidiaphragm and adjacent left basilar
atelectasis or scarring.
2.  Stable mild right basilar scarring, cardiomegaly and changes of
COPD and chronic bronchitis.
3.  No significant change in small bilateral pleural effusions.

## 2008-04-29 ENCOUNTER — Encounter: Payer: Self-pay | Admitting: Cardiology

## 2008-04-29 IMAGING — CT CT ANGIO CHEST
2 of 6 series · 18 of 36 positions shown · IV contrast (100 ML OMNI 300)
Comparison: Chest radiographs [DATE]

CLINICAL DATA: Shortness of breath.  History of diabetes and
congestive heart failure.  Question pulmonary embolism.

CT ANGIOGRAPHY CHEST
TECHNIQUE: Multidetector CT imaging of the chest using the
standard protocol during bolus administration of intravenous
contrast. Multiplanar reconstructed images including MIPs were
obtained and reviewed to evaluate the vascular anatomy.
Contrast: 100 ml [98] intravenously.

[Series 2: pe · axial · 0.70mm/px · z∈[-260,-23]mm · 17 of 213 slices shown]
[im 12/213  lung]
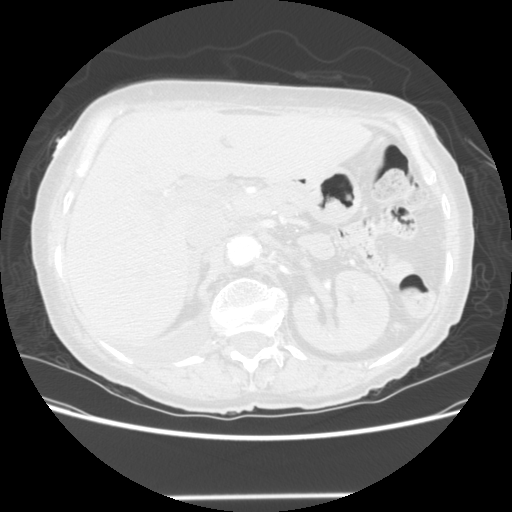
[im 24/213  mediastinal]
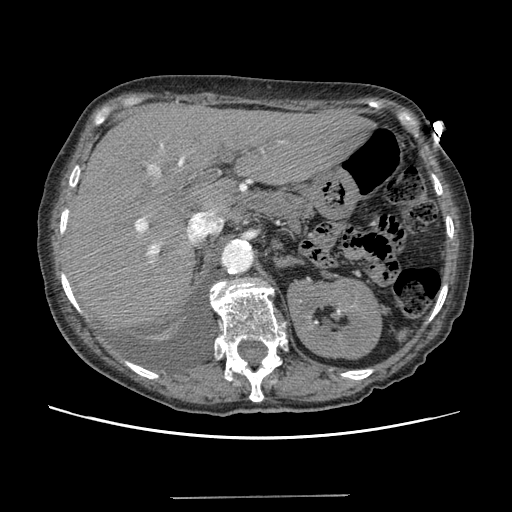
[im 36/213  lung]
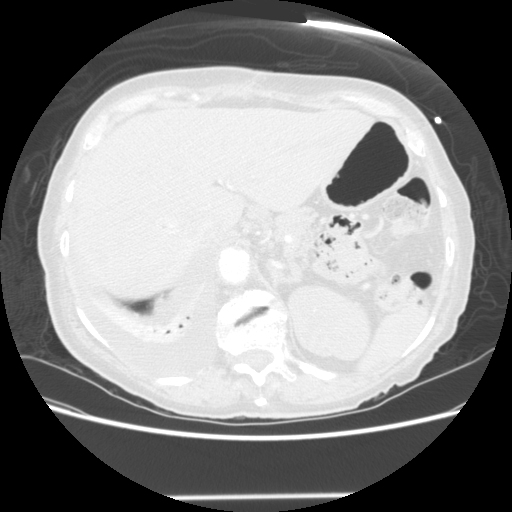
[im 48/213  mediastinal]
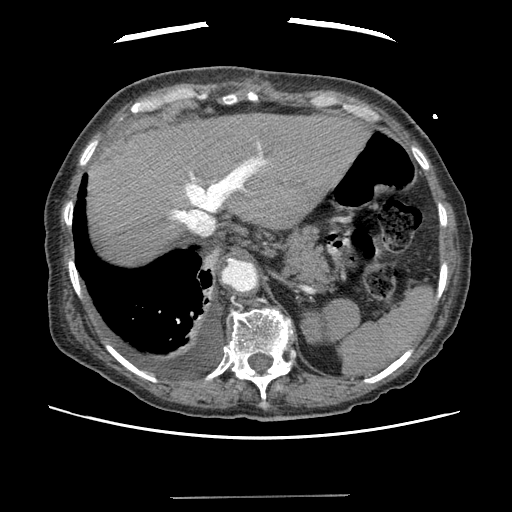
[im 59/213  lung]
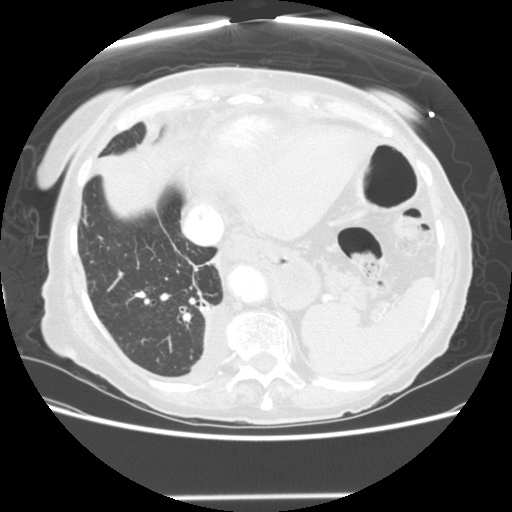
[im 71/213  mediastinal]
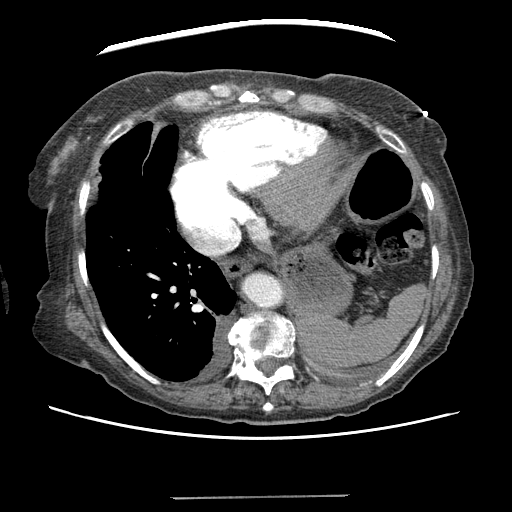
[im 83/213  lung]
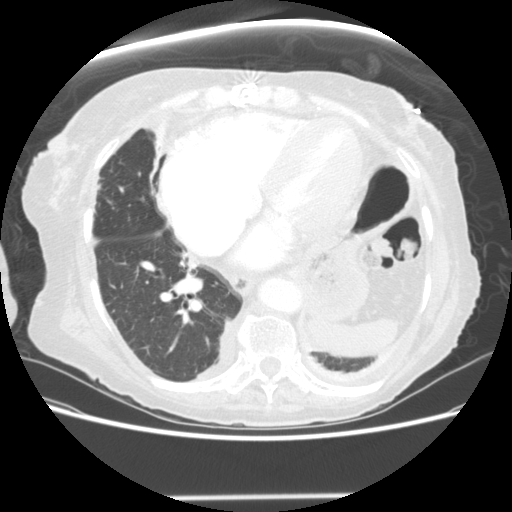
[im 95/213  mediastinal]
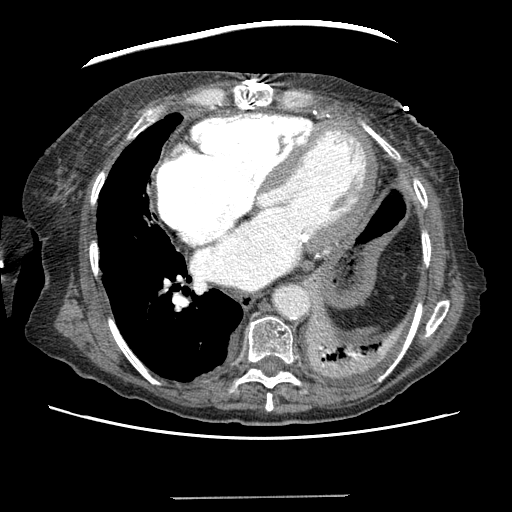
[im 107/213  lung]
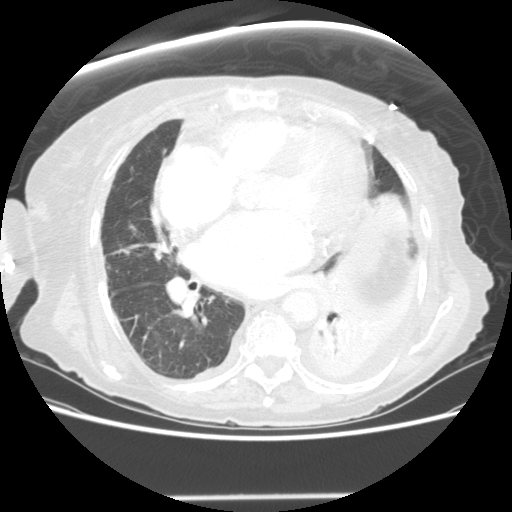
[im 118/213  mediastinal]
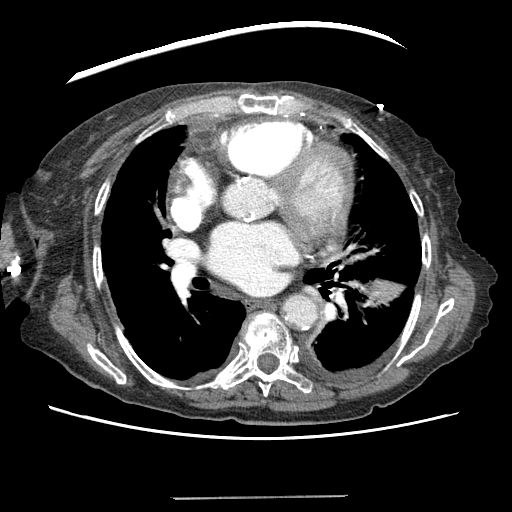
[im 130/213  lung]
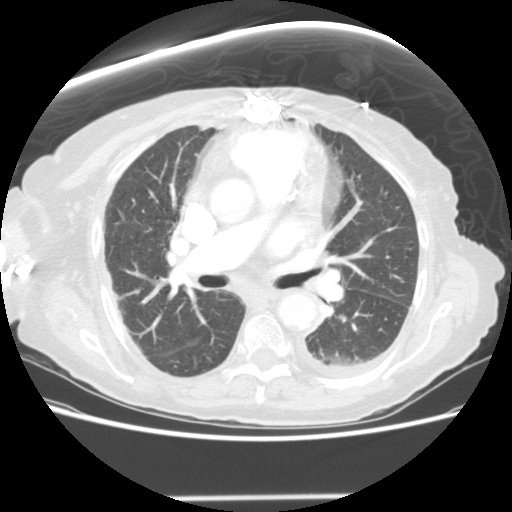
[im 142/213  mediastinal]
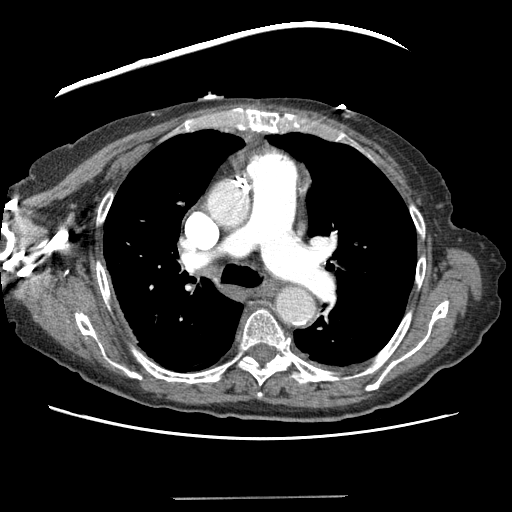
[im 154/213  lung]
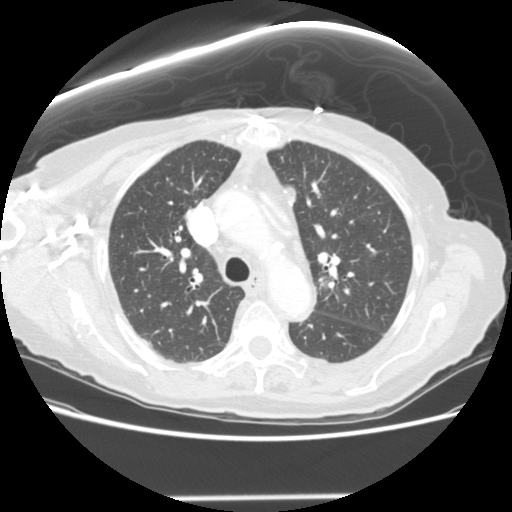
[im 165/213  mediastinal]
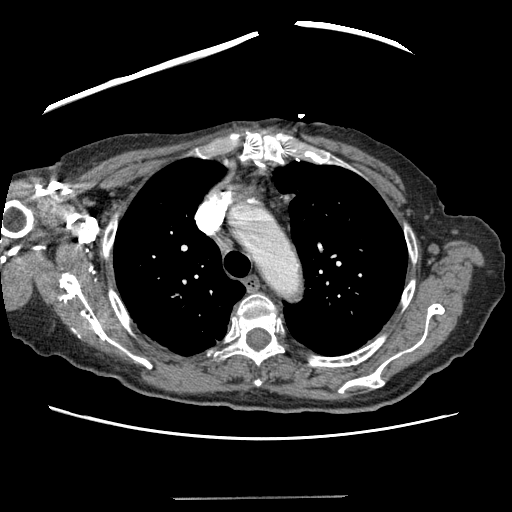
[im 177/213  lung]
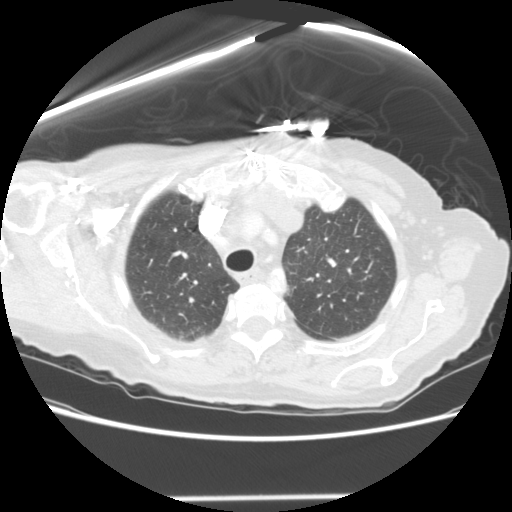
[im 189/213  mediastinal]
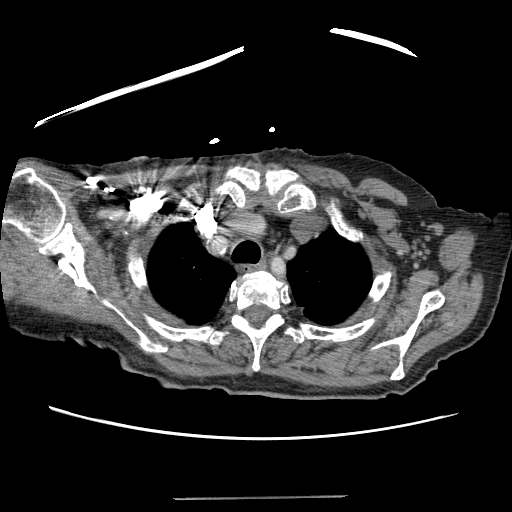
[im 201/213  lung]
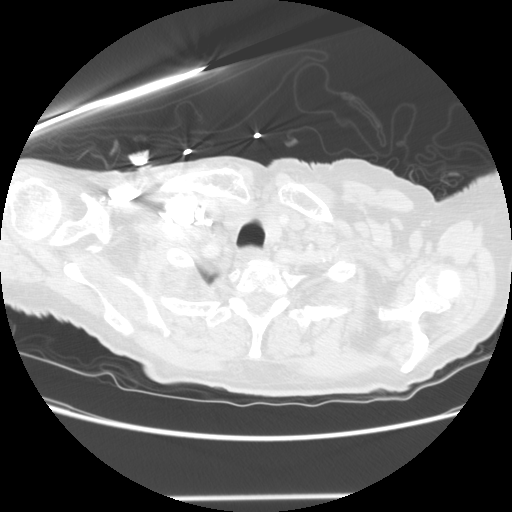

[Series 202: coronal · coronal · 0.70mm/px · 1 of 82 slices shown]
[im 41/82  mediastinal]
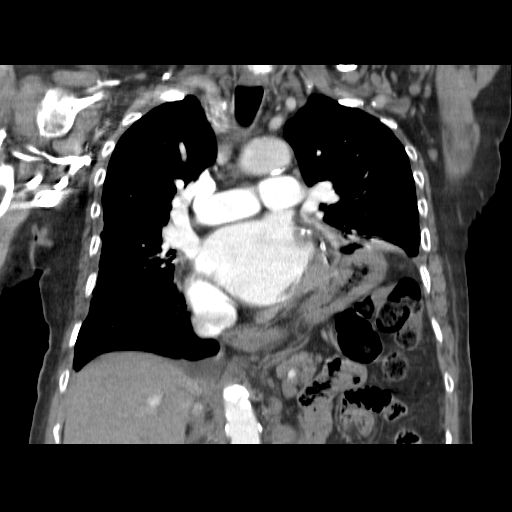

[18 of 36 positions shown; findings below may reference images not displayed]

FINDINGS: The pulmonary arteries are well opacified with contrast.
There is no evidence of acute pulmonary embolism.  Postoperative
changes are present status post median sternotomy and CABG.  There
is aortic atherosclerosis.  No enlarged mediastinal or hilar lymph
nodes are present.

There are small to moderate bilateral pleural effusions.  No
significant pericardial effusion is present.

There is chronic elevation of the left hemidiaphragm.  There is
increased left lower lobe air space disease with air bronchograms
compared with yesterday's radiographs.  Mild right lower lobe
atelectasis appears stable.  No endobronchial lesion or lung mass
is apparent.

Images through the upper abdomen demonstrate no acute findings.
There is no evidence of adrenal mass.
IMPRESSION: 1.  No evidence of acute pulmonary embolism.
2.  Bilateral pleural effusions.
3.  Elevated left hemidiaphragm with increased left lower lobe air
space disease compared with recent radiographs.  This may reflect
atelectasis or pneumonia.  Radiographic followup is recommended.

## 2008-04-30 ENCOUNTER — Encounter: Payer: Self-pay | Admitting: Cardiology

## 2008-05-09 ENCOUNTER — Ambulatory Visit: Payer: Self-pay | Admitting: Cardiology

## 2008-05-19 ENCOUNTER — Ambulatory Visit: Payer: Self-pay | Admitting: Cardiology

## 2008-05-19 ENCOUNTER — Encounter: Payer: Self-pay | Admitting: Cardiology

## 2008-05-19 DIAGNOSIS — I5032 Chronic diastolic (congestive) heart failure: Secondary | ICD-10-CM | POA: Insufficient documentation

## 2008-05-19 DIAGNOSIS — I739 Peripheral vascular disease, unspecified: Secondary | ICD-10-CM | POA: Insufficient documentation

## 2008-05-19 DIAGNOSIS — I701 Atherosclerosis of renal artery: Secondary | ICD-10-CM | POA: Insufficient documentation

## 2008-05-19 DIAGNOSIS — I4891 Unspecified atrial fibrillation: Secondary | ICD-10-CM | POA: Insufficient documentation

## 2008-06-18 ENCOUNTER — Ambulatory Visit: Payer: Self-pay | Admitting: Vascular Surgery

## 2008-07-23 ENCOUNTER — Ambulatory Visit: Payer: Self-pay | Admitting: Vascular Surgery

## 2008-08-29 ENCOUNTER — Encounter: Payer: Self-pay | Admitting: Physician Assistant

## 2008-08-29 ENCOUNTER — Ambulatory Visit: Payer: Self-pay | Admitting: Cardiology

## 2008-09-01 ENCOUNTER — Ambulatory Visit: Payer: Self-pay | Admitting: Vascular Surgery

## 2008-09-01 ENCOUNTER — Inpatient Hospital Stay (HOSPITAL_COMMUNITY): Admission: EM | Admit: 2008-09-01 | Discharge: 2008-09-10 | Payer: Self-pay | Admitting: Emergency Medicine

## 2008-09-01 ENCOUNTER — Ambulatory Visit: Payer: Self-pay | Admitting: Internal Medicine

## 2008-09-01 IMAGING — CT CT ANGIO ABDOMEN
2 of 5 series · 13 of 46 positions shown, 18 images · IV contrast (omnipaque)
Comparison: [DATE]

CTA ABDOMEN

CLINICAL DATA: Abdominal pain.  Nausea, vomiting, diarrhea.
Weakness.  Previous appendectomy.

CT ANGIOGRAPHY ABDOMEN AND PELVIS
TECHNIQUE: Multidetector CT imaging of the abdomen and pelvis was
performed using the standard protocol during bolus administration
of intravenous contrast. Multiplanar reconstructed images including
MIPs were obtained and reviewed to evaluate the vascular anatomy.
Contrast: 100 ml Omnipaque 315

[Series 5: arterial 2.0 (id) · axial · arterial · 0.66mm/px · z∈[-389,-113]mm · 7 of 203 slices shown]
[im 22/203  soft-tissue]
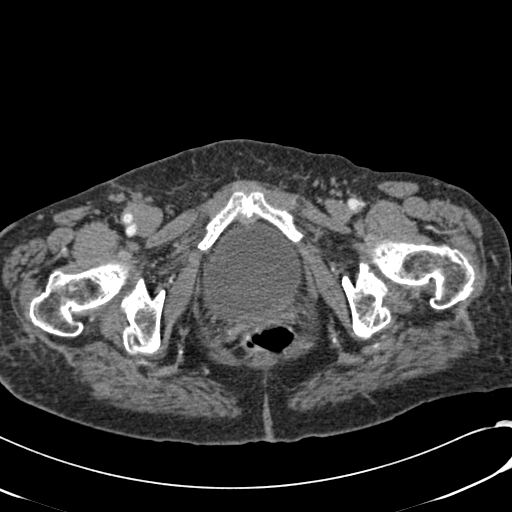
[im 43/203  soft-tissue]
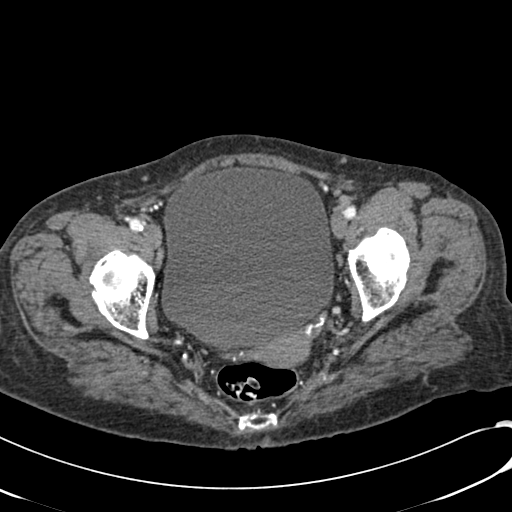
[im 64/203  soft-tissue]
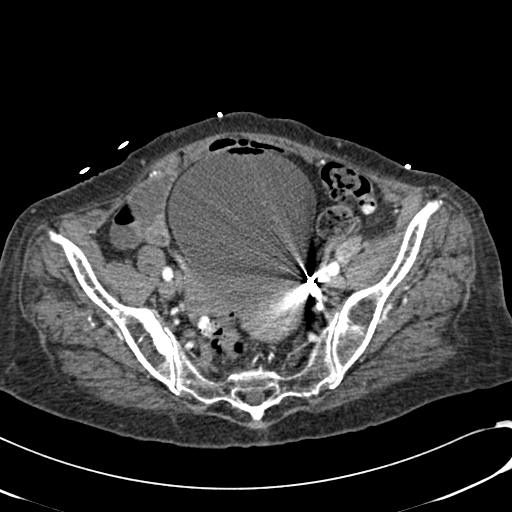
[im 86/203  soft-tissue]
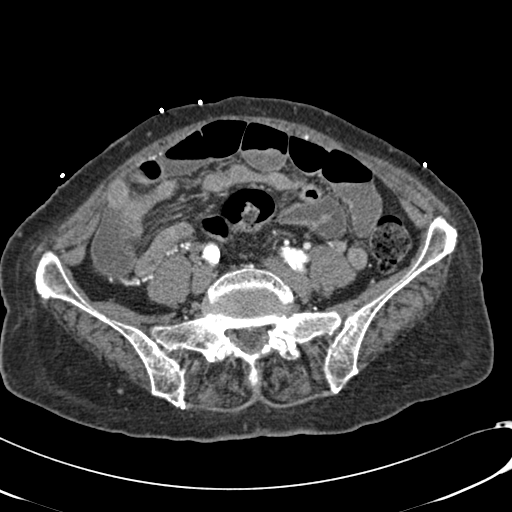
[im 117/203  soft-tissue]
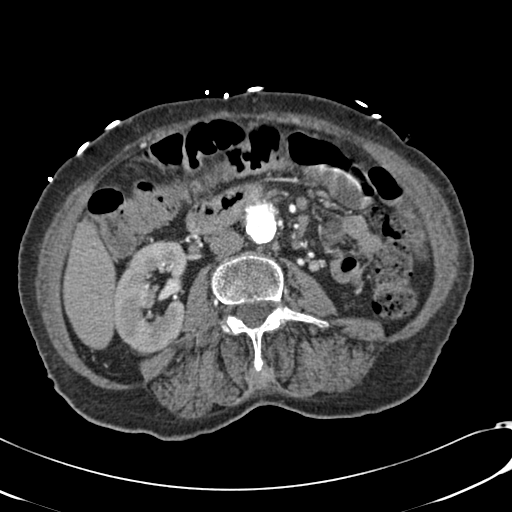
[im 139/203  soft-tissue]
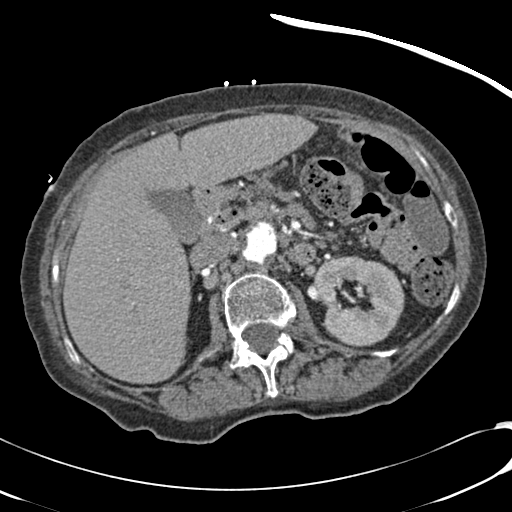
[im 160/203  soft-tissue]
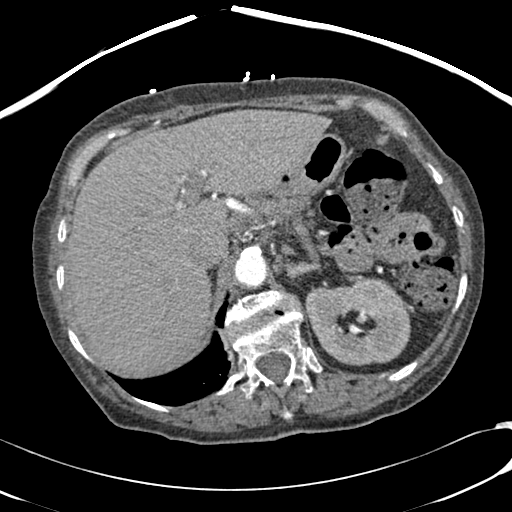

[Series 7: venous 5.0 b30f st · axial · portal-venous · 0.66mm/px · z∈[-377,-87]mm · 6 of 82 slices shown, 11 images]
[im 12/82  soft-tissue]
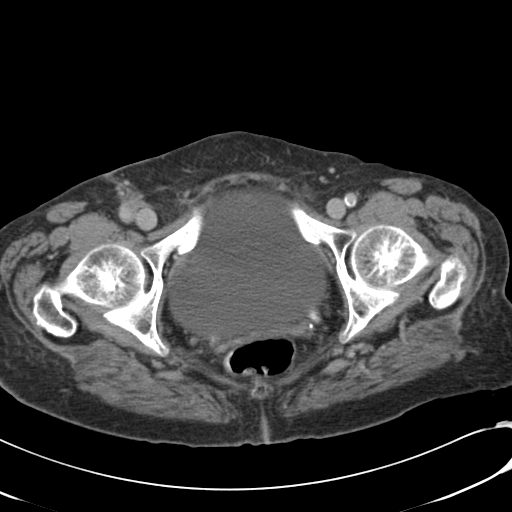
[im 12/82  bone]
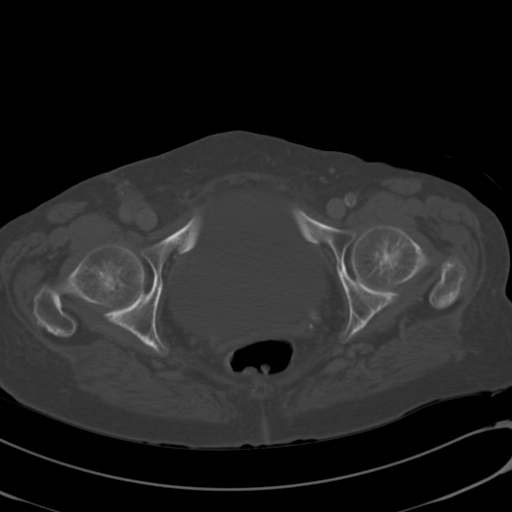
[im 24/82  soft-tissue]
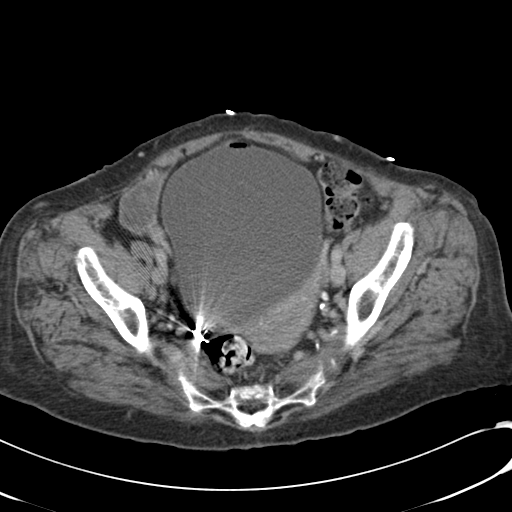
[im 35/82  soft-tissue]
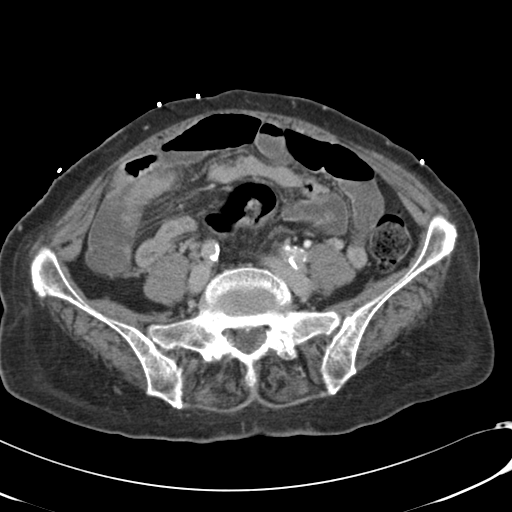
[im 35/82  lung]
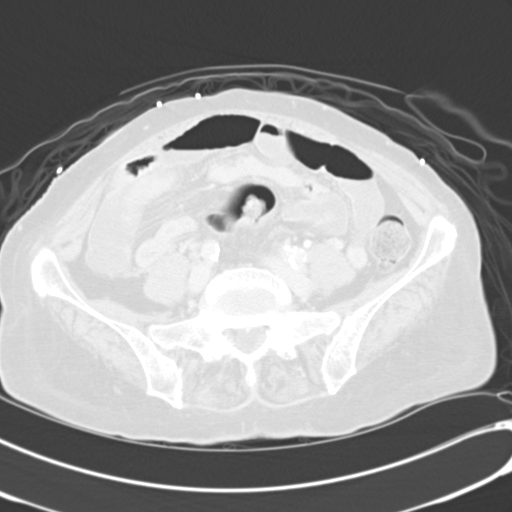
[im 47/82  soft-tissue]
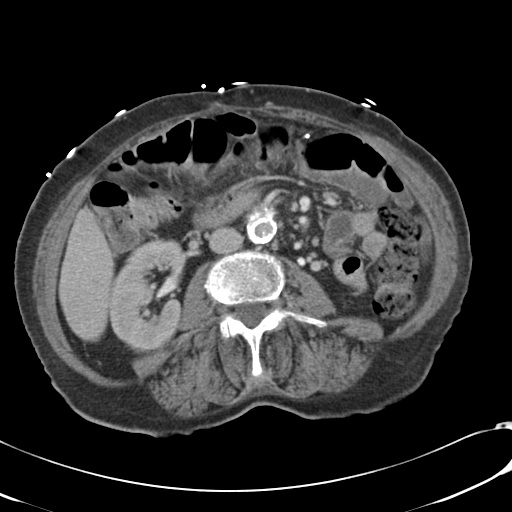
[im 47/82  lung]
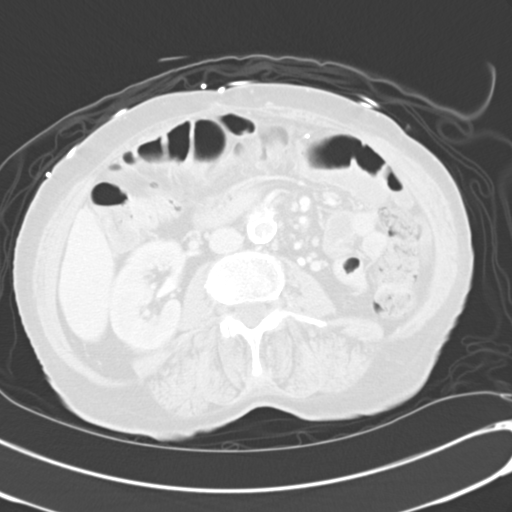
[im 58/82  soft-tissue]
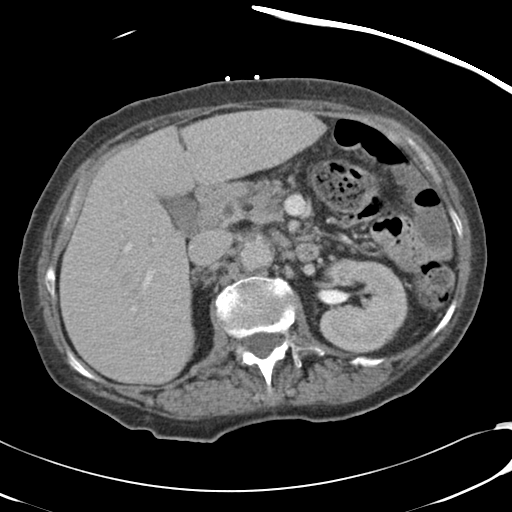
[im 58/82  lung]
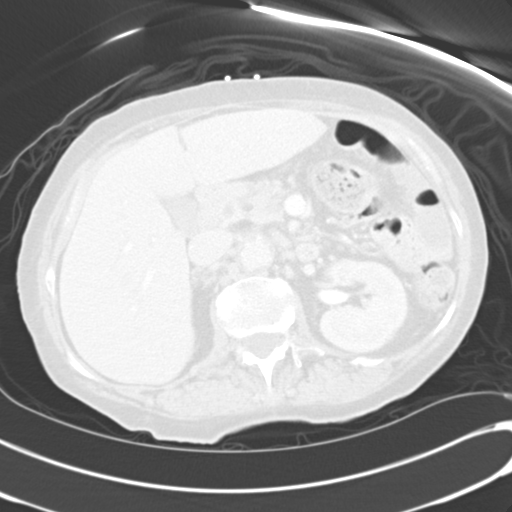
[im 70/82  soft-tissue]
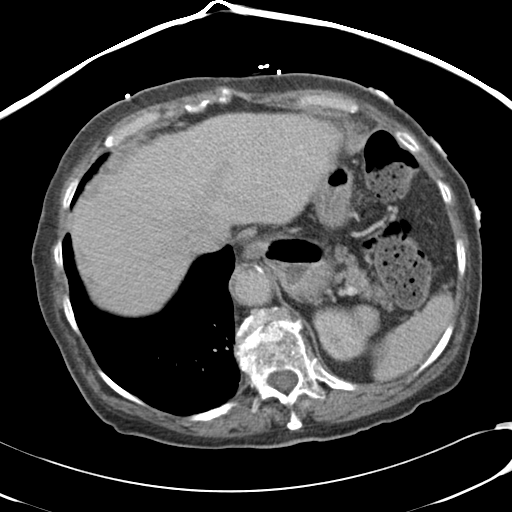
[im 70/82  lung]
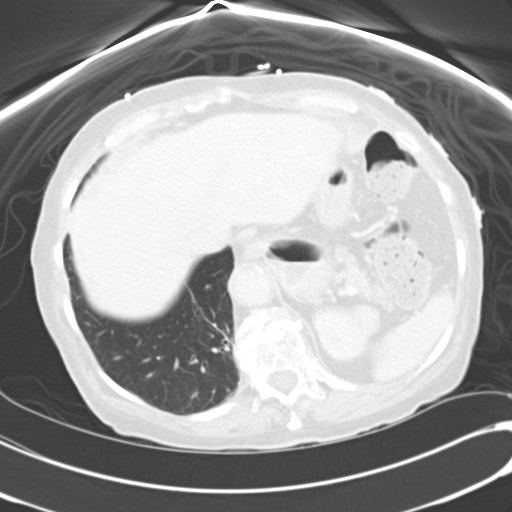

[13 of 46 positions shown; findings below may reference images not displayed]

FINDINGS: Lung bases are clear.  No pleural or pericardial
effusion.  Chronic elevation of the left hemidiaphragm.

The liver, spleen, pancreas, adrenal glands and kidneys are
unremarkable.  No retroperitoneal mass or adenopathy.  No free
intraperitoneal fluid or air.  No definite bowel pathology.

There is severe atherosclerotic disease of the lower thoracic in
the abdominal aorta.  There is an infrarenal abdominal aortic
aneurysm that measures maximally 3.3 x 3.1 cm.  This has not
changed significantly since the previous exam.  The celiac artery
is severely stenotic, with a persistent patent lumen of 1 mm.  The
superior mesenteric artery is occluded at its origin but receives
collaterals and demonstrates flow distally.  The inferior
mesenteric artery is patent.  This is a very small vessel and I am
sure that there is some degree of origin narrowing.

There are two renal arteries serving each kidney.  There is severe
stenotic disease affecting these vessels bilaterally.

 Review of the MIP images confirms the above findings.
IMPRESSION: No acute finding in the abdomen.

Advanced irregular atherosclerotic disease of the aorta.
Infrarenal abdominal aortic aneurysm not significantly changed
since previous exam of maximal diameter of 3.1 x 3.3 cm.

Severe stenosis of the celiac artery.  Residual patent lumen 1 mm
or less.

Occlusion of the superior mesenteric artery proximally but with
reconstitution via collaterals.

Inferior mesenteric artery patent.

Two renal arteries serving each kidney.  Renal artery stenoses
bilaterally.

CTA PELVIS
FINDINGS: There is severe atherosclerotic disease of the
iliofemoral vessels but no flow-limiting stenosis is identified.
There is no free fluid in the pelvis.  The uterus and adnexal
regions are unremarkable.  No bowel pathology is evident.  No mass
or adenopathy.

There is spinal stenosis at L4-5 because of degenerative disc
disease and degenerative facet disease.  There is anterolisthesis
of 5 mm at this level.

 Review of the MIP images confirms the above findings.
IMPRESSION: Atherosclerotic disease of the iliofemoral vessels without
occlusion or suspicion of flow-limiting stenosis.

Degenerative disc disease and degenerative facet disease in the
lower lumbar spine with spinal stenosis at L4-5.

## 2008-09-02 ENCOUNTER — Encounter (INDEPENDENT_AMBULATORY_CARE_PROVIDER_SITE_OTHER): Payer: Self-pay | Admitting: *Deleted

## 2008-09-02 LAB — CONVERTED CEMR LAB: Hgb A1c MFr Bld: 6.2 %

## 2008-09-02 IMAGING — CR DG CHEST 2V
2 series · 2 of 2 positions shown · non-contrast
Comparison: [DATE]

CLINICAL DATA: Mesenteric ischemia.  Weakness.

CHEST - 2 VIEW

[w chest pa]
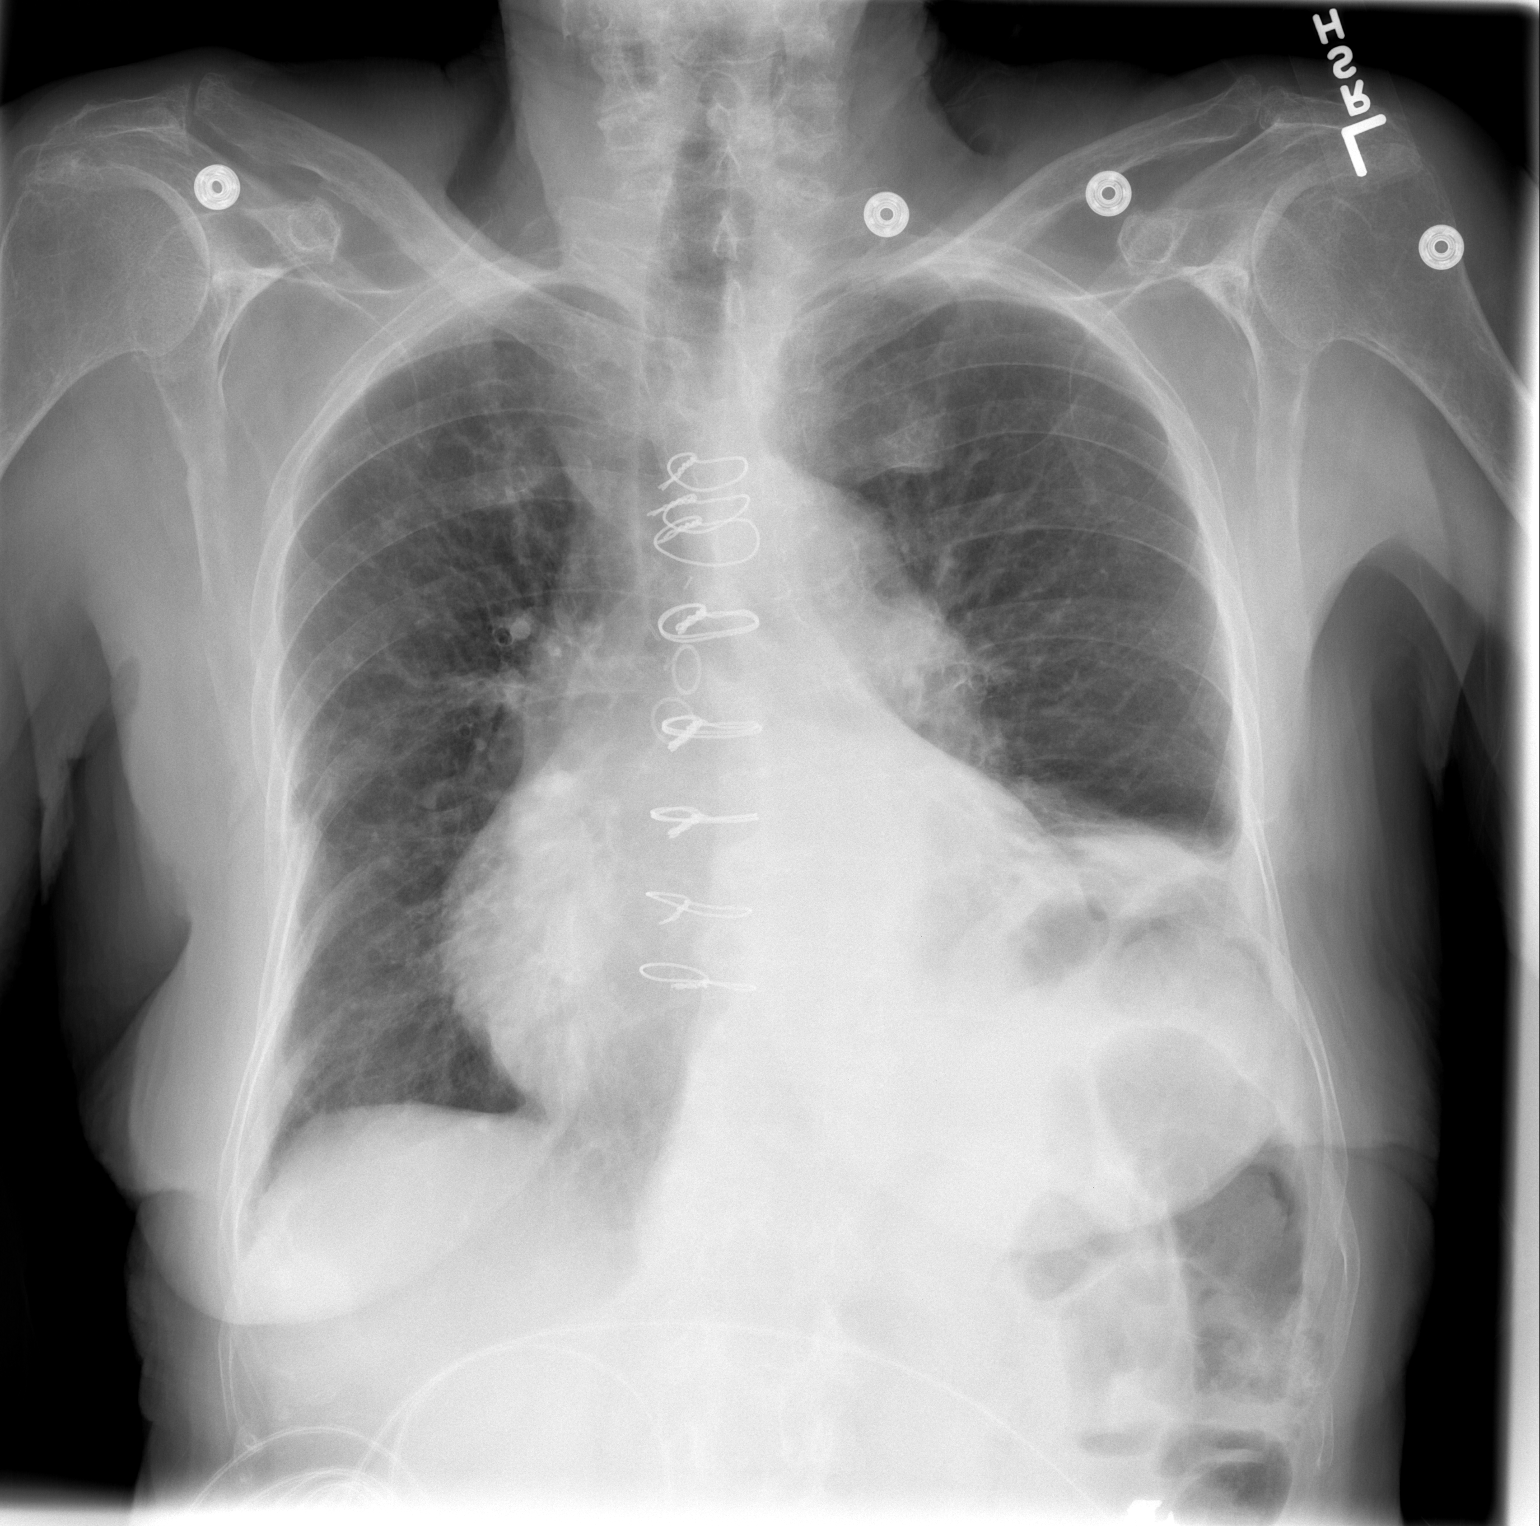

[w chest lat]
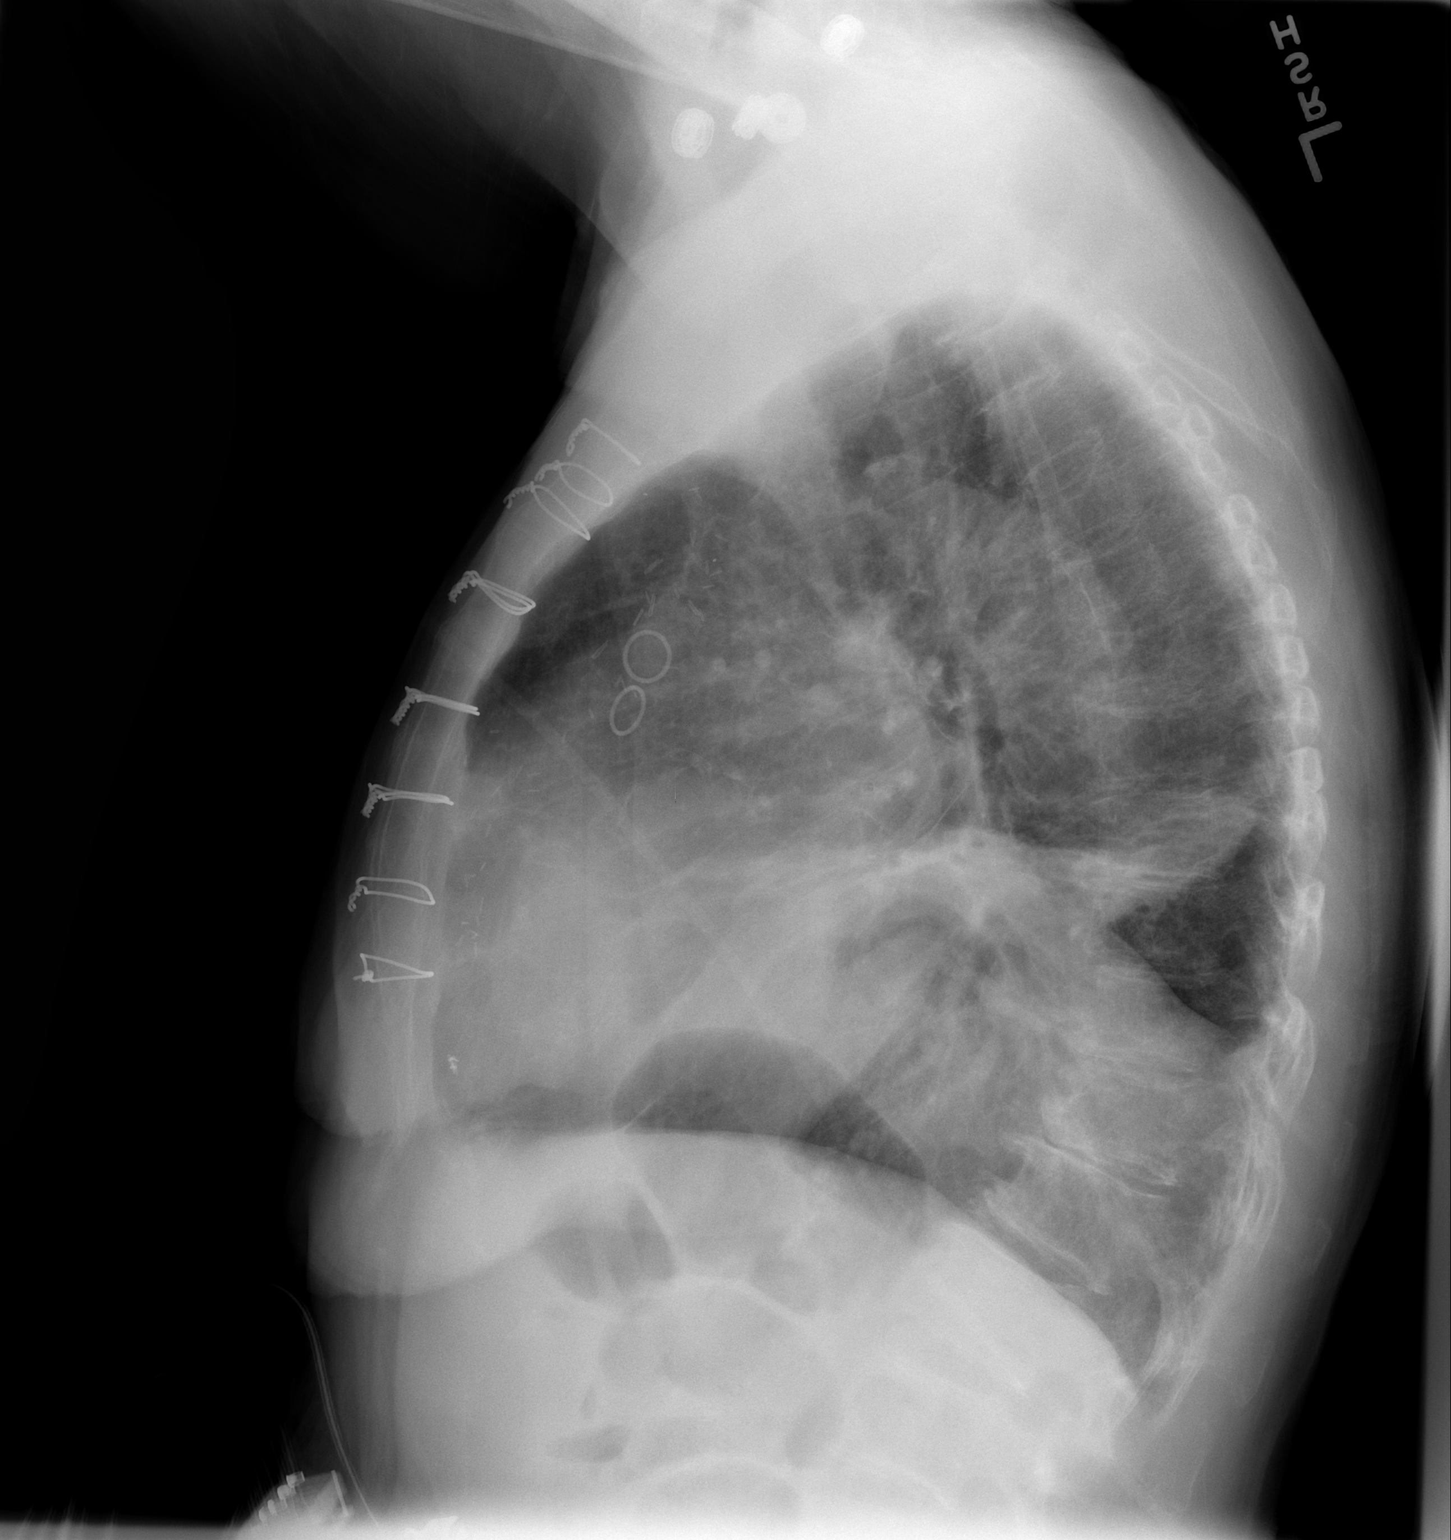

[2 of 2 positions shown; findings below may reference images not displayed]

FINDINGS: The heart is enlarged but stable.  There are stable
surgical changes from double bypass surgery.  There are chronic
bronchitic type lung changes and COPD but no acute overlying
pulmonary process.  Stable elevation of the left hemidiaphragm with
overlying atelectasis.  The bony thorax appears stable.
IMPRESSION: 1.  Chronic lung changes and stable cardiac enlargement.
2.  No acute overlying pulmonary process.
3.  Stable elevation of left hemidiaphragm with overlying
atelectasis.

## 2008-09-04 ENCOUNTER — Encounter: Payer: Self-pay | Admitting: Internal Medicine

## 2008-09-12 ENCOUNTER — Telehealth: Payer: Self-pay | Admitting: Internal Medicine

## 2008-09-15 ENCOUNTER — Telehealth: Payer: Self-pay | Admitting: Internal Medicine

## 2008-10-15 ENCOUNTER — Encounter: Payer: Self-pay | Admitting: Internal Medicine

## 2008-10-15 ENCOUNTER — Ambulatory Visit: Payer: Self-pay | Admitting: Internal Medicine

## 2008-10-22 ENCOUNTER — Ambulatory Visit: Payer: Self-pay | Admitting: Vascular Surgery

## 2008-10-29 ENCOUNTER — Ambulatory Visit: Payer: Self-pay | Admitting: Vascular Surgery

## 2008-10-29 ENCOUNTER — Encounter: Admission: RE | Admit: 2008-10-29 | Discharge: 2008-10-29 | Payer: Self-pay | Admitting: Vascular Surgery

## 2008-10-29 IMAGING — CT CT ANGIO ABDOMEN
1 of 7 series · 10 of 36 positions shown, 16 images · IV contrast (omnipaque)
Comparison: [DATE]

CTA ABDOMEN

CLINICAL DATA: Mesenteric occlusive disease with chronic occlusion
of proximal SMA.  The patient is status post proximal celiac axis
stenting.  Known abdominal aortic aneurysm.

CT ANGIOGRAPHY ABDOMEN AND PELVIS
TECHNIQUE: Multidetector CT imaging of the abdomen and pelvis was
performed using the standard protocol during bolus administration
of intravenous contrast. Multiplanar reconstructed images including
MIPs were obtained and reviewed to evaluate the vascular anatomy.
Contrast: 100 ml Omnipaque 350 IV

[Series 5: arterial, portal venous · axial · arterial · 0.66mm/px · z∈[-276,+2]mm · 10 of 205 slices shown, 16 images]
[im 19/205  soft-tissue]
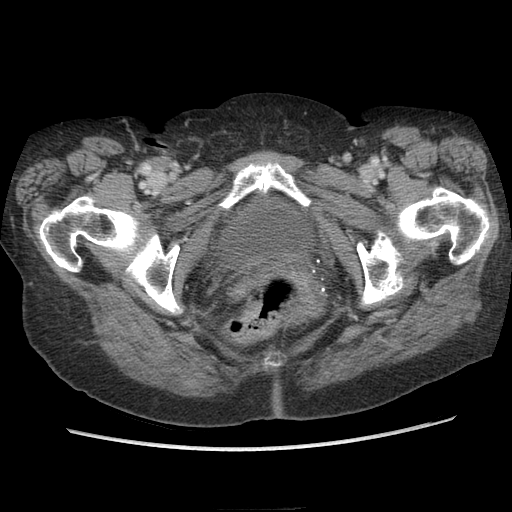
[im 19/205  bone]
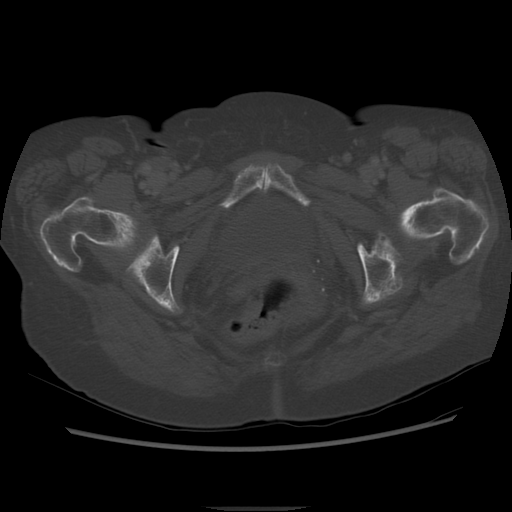
[im 38/205  soft-tissue]
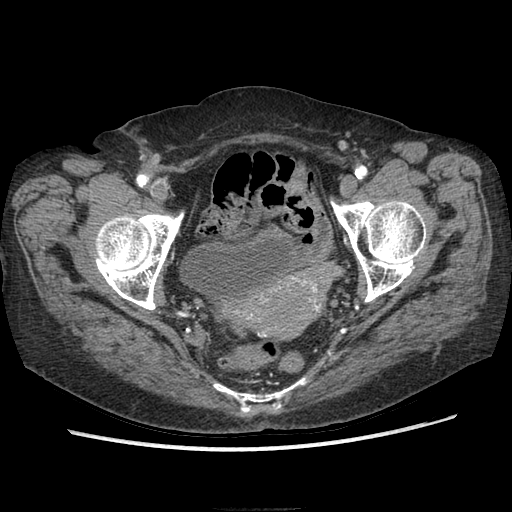
[im 56/205  soft-tissue]
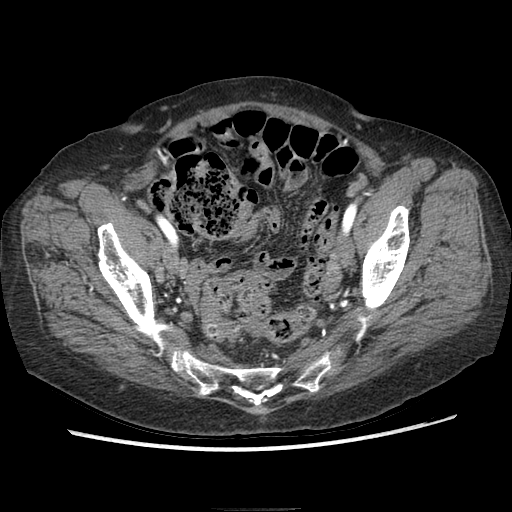
[im 75/205  soft-tissue]
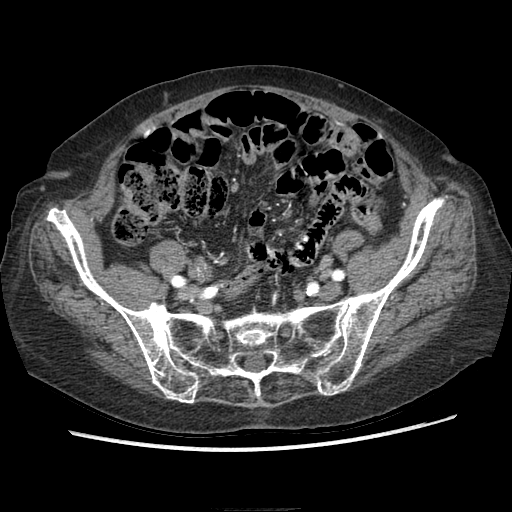
[im 93/205  soft-tissue]
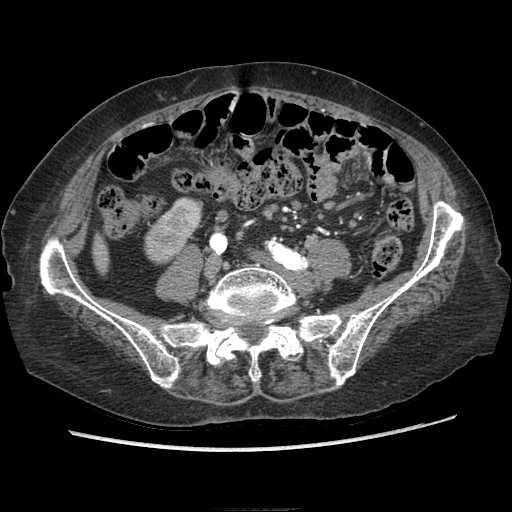
[im 112/205  soft-tissue]
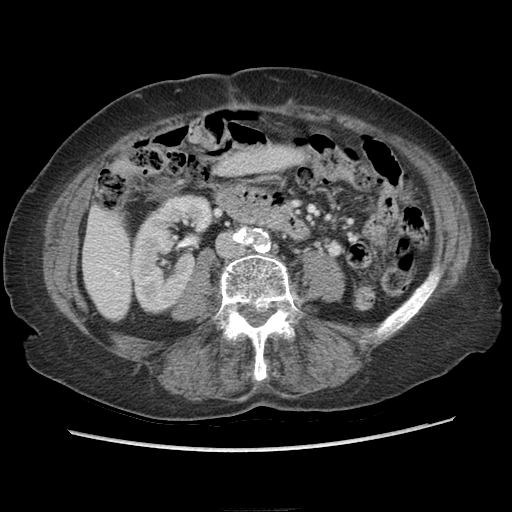
[im 130/205  soft-tissue]
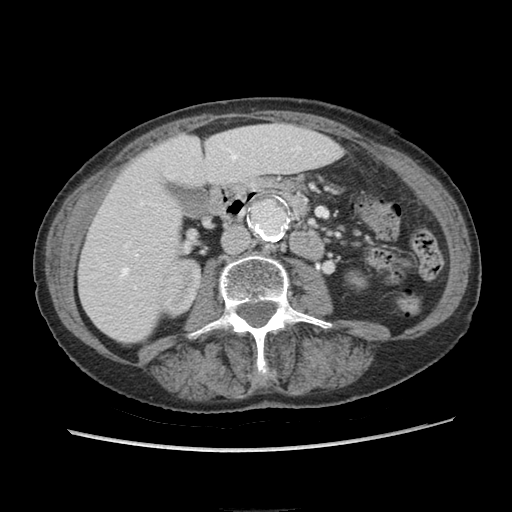
[im 130/205  lung]
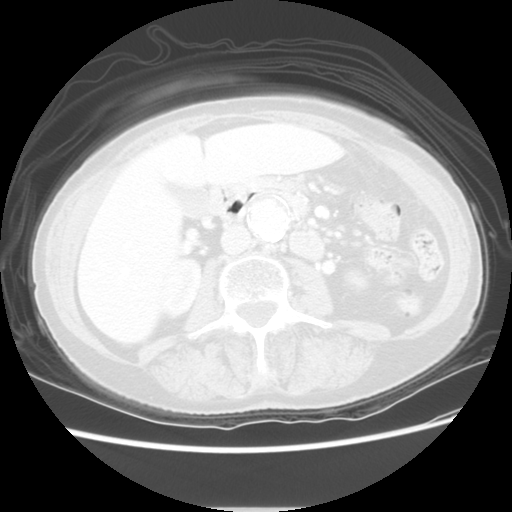
[im 149/205  soft-tissue]
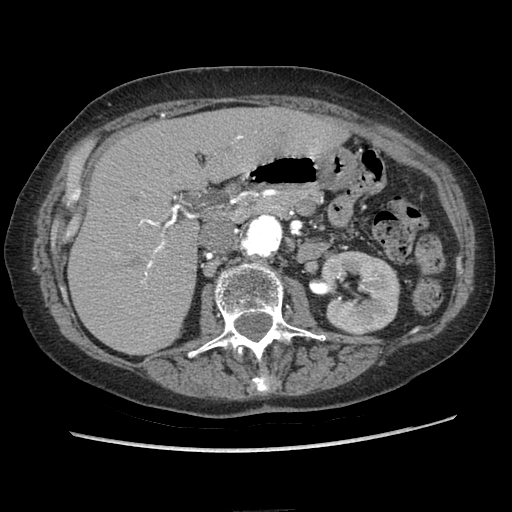
[im 149/205  lung]
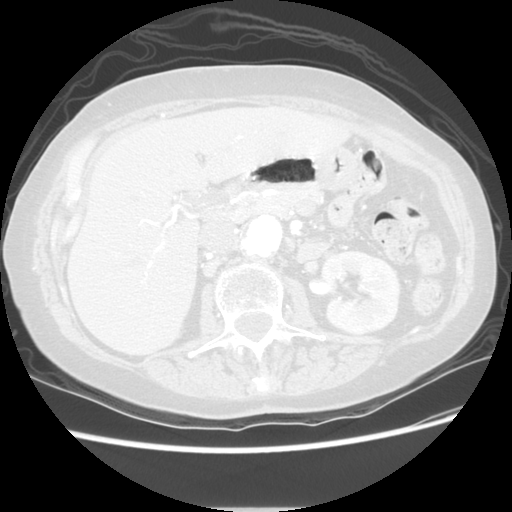
[im 167/205  soft-tissue]
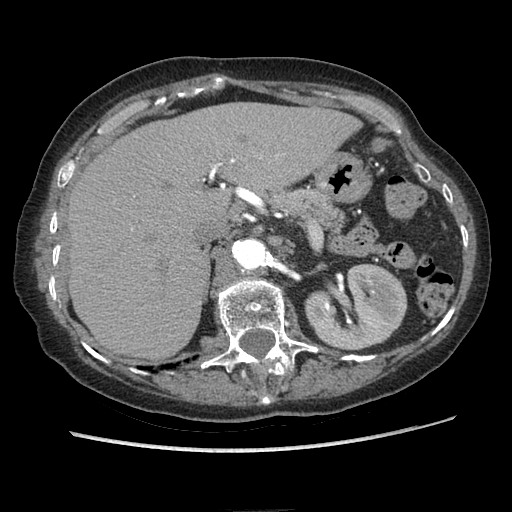
[im 167/205  lung]
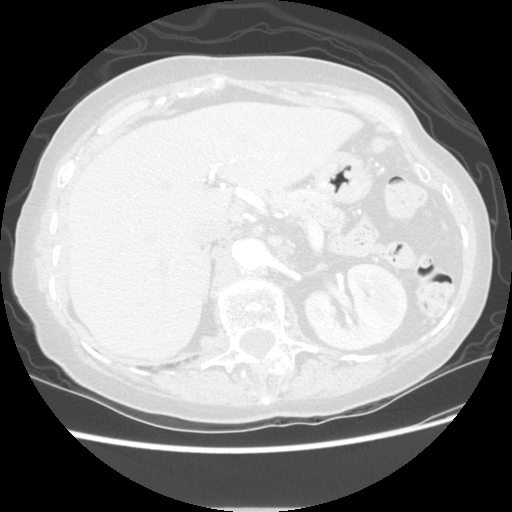
[im 167/205  bone]
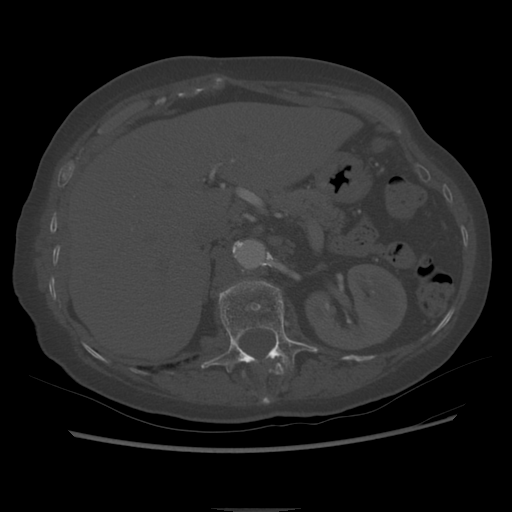
[im 186/205  soft-tissue]
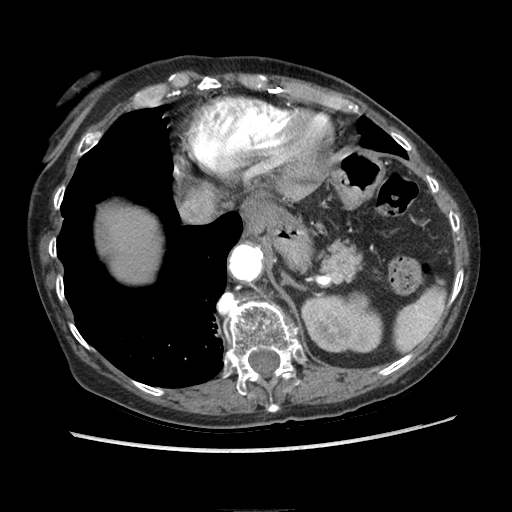
[im 186/205  lung]
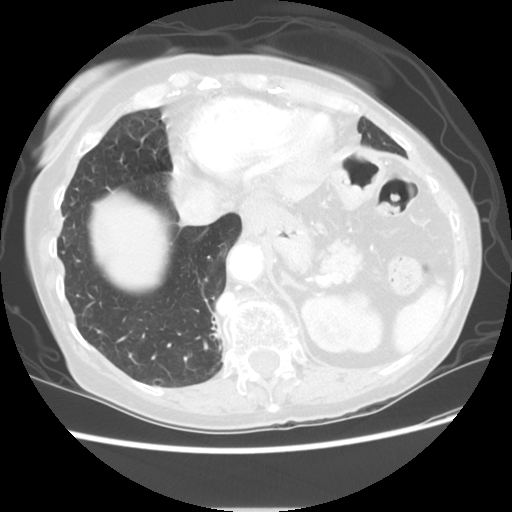

[10 of 36 positions shown; findings below may reference images not displayed]

FINDINGS: A percutaneous stent has been placed in the proximal
celiac axis since prior imaging.  The stented segment demonstrates
improved patency since prior CTA.  The stent is mildly compressed
at the level of the celiac origin by heavily calcified plaque.
There is some residual post stenotic dilatation beyond the stent in
the celiac trunk.  There is no evidence of intimal hyperplasia by
CTA in the stent lumen which is well visualized.

There is a stable appearance to occlusion of the proximal SMA
trunk.  The trunk is reconstituted by peripancreatic collaterals.
The inferior mesenteric artery remains open.  Mild aneurysmal
disease of the distal abdominal aorta is stable with measured
maximal AP diameter of 3.0 - 3.1 cm.

Atherosclerotic disease at both proximal renal artery origins is
stable and not causing critical stenosis.

 Review of the MIP images confirms the above findings.
IMPRESSION: Improved patency of proximal celiac axis after intravascular
stenting.  There is mild compression of the stent at the origin of
the celiac trunk by heavily calcified plaque.  Proximal SMA
demonstrates stable chronic occlusion.  Mild aneurysmal dilatation
of the abdominal aorta is stable.

CTA PELVIS
FINDINGS: Iliac arteries show stable atherosclerotic disease
without significant stenosis or occlusion.  Common femoral arteries
are normally patent.  Femoral bifurcations are well demonstrated
bilaterally and show normal patency.

No other incidental findings are present in the pelvis.

 Review of the MIP images confirms the above findings.
IMPRESSION: Unremarkable CTA of pelvis.

## 2008-10-29 IMAGING — CR DG CHEST 2V
2 series · 2 of 2 positions shown · non-contrast
Comparison: [DATE]

CLINICAL DATA: Chronic cough

CHEST - 2 VIEW

[w chest pa]
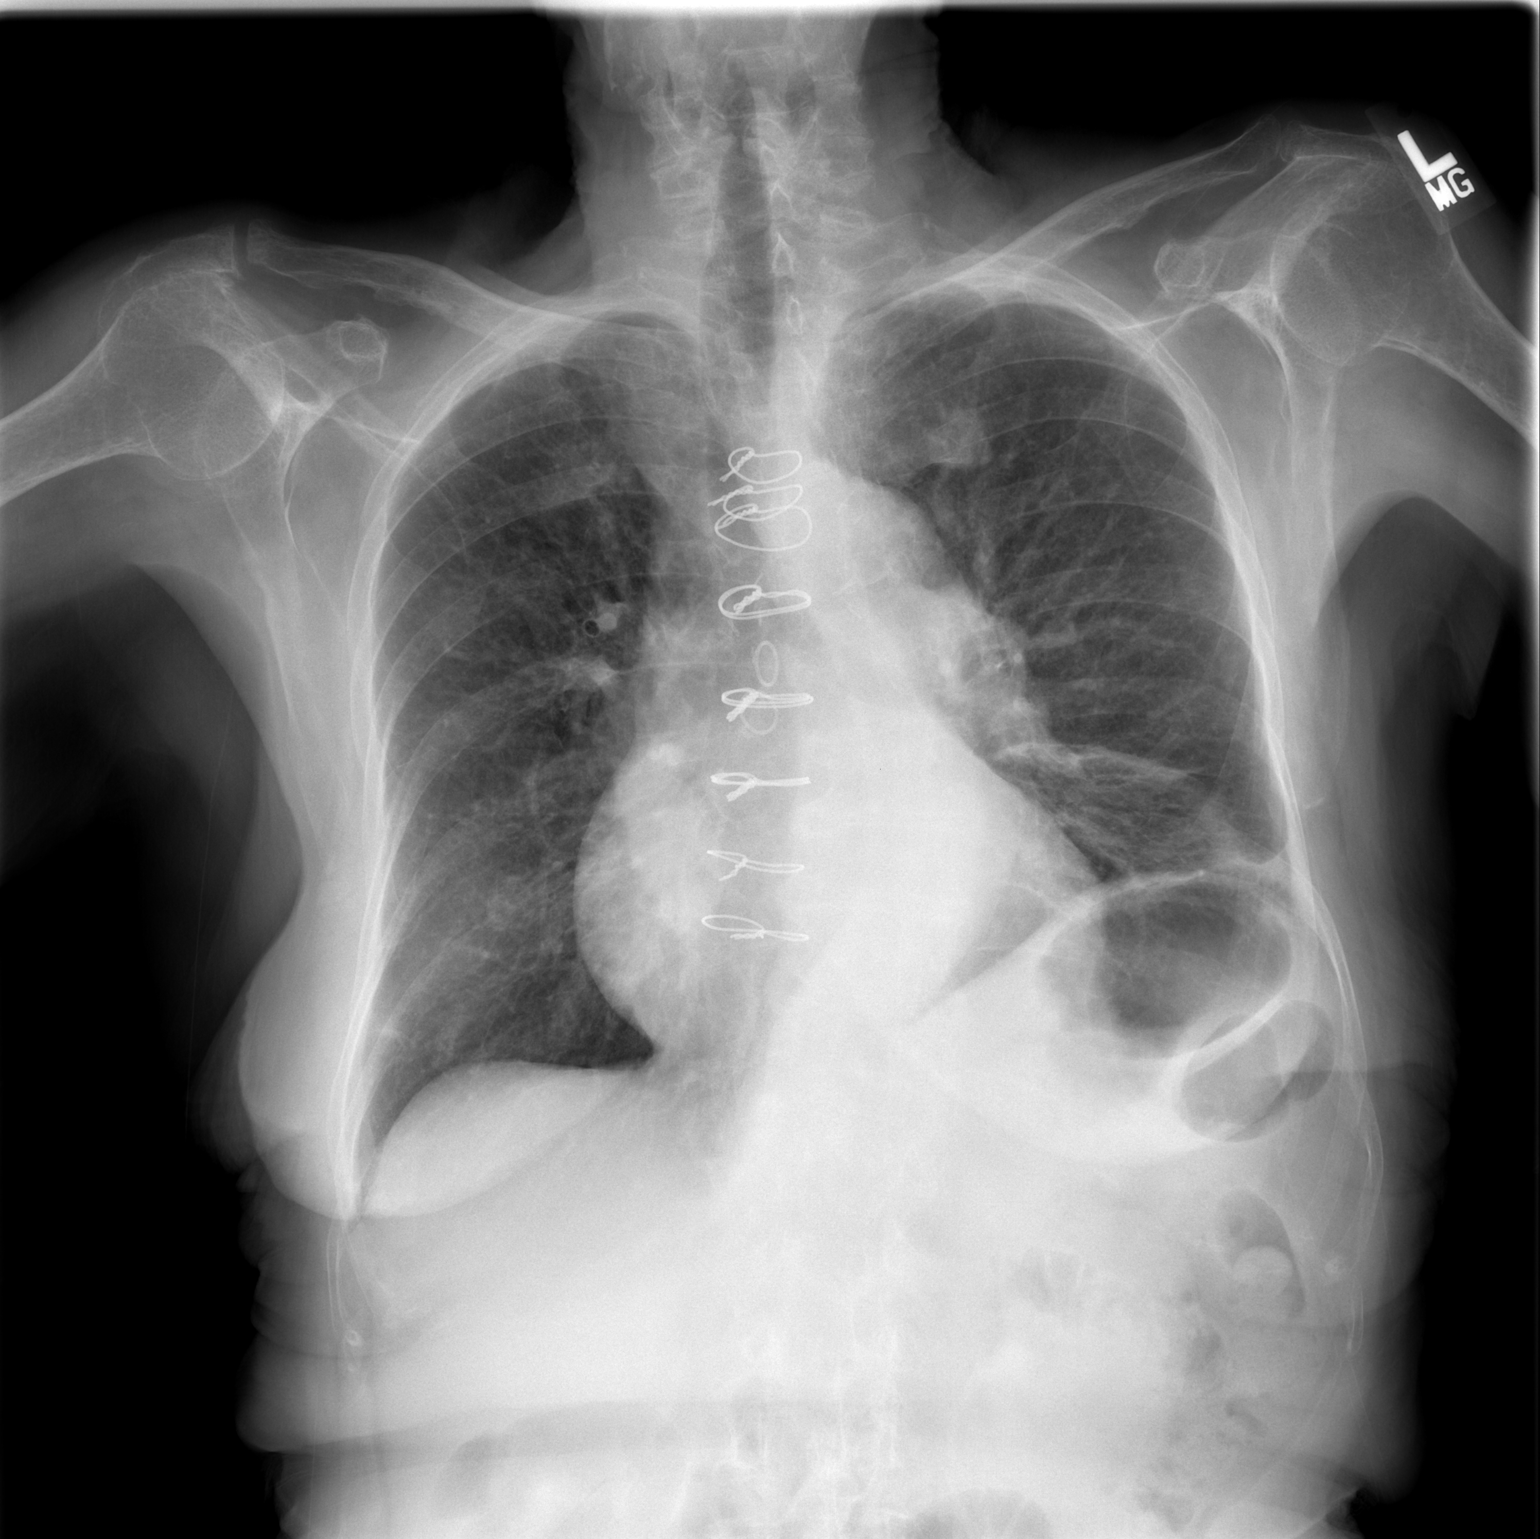

[w chest lat]
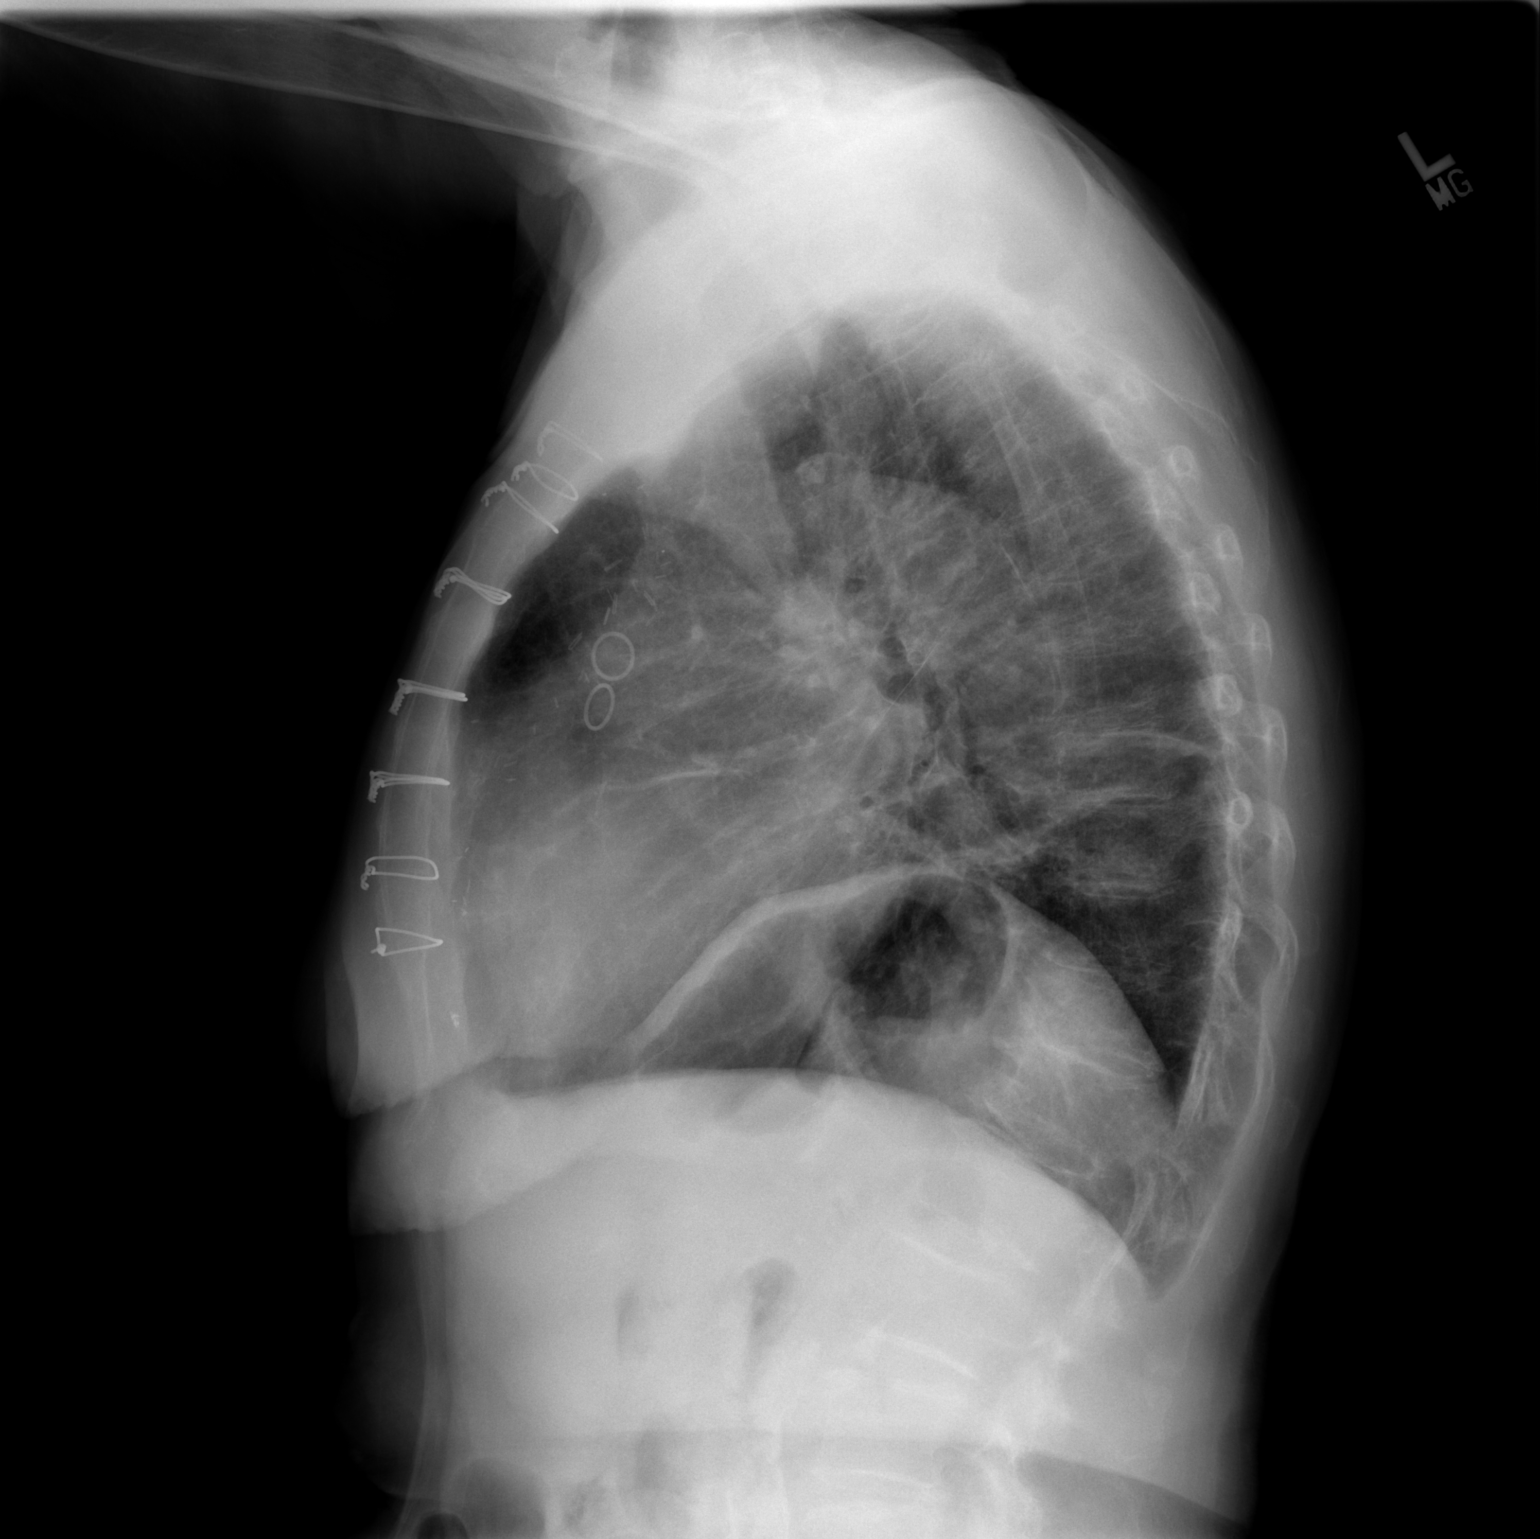

[2 of 2 positions shown; findings below may reference images not displayed]

FINDINGS: Post CABG.  Heart size smaller - now normal.  Aorta
tortuous.  No congestive heart failure.

There is persistent elevation of the left hemidiaphragm with
subsegmental atelectasis of the left lower lobe.  This has improved
since the prior study [REDACTED], but has not completely resolved.
This raises the question of partial airway obstruction to the left
lower lobe bronchus.  No endobronchial lesion was noted on the CT
in [DATE].  Nonetheless, a small endobronchial lesion cannot
be excluded.  Considering the history of chronic cough, short term
radiographic follow-up is recommended, versus bronchoscopy.
IMPRESSION: 1.  Left lower lobe atelectasis improved but not resolved.  See
above discussion.
2.  No other acute or significant findings.

## 2009-03-17 ENCOUNTER — Encounter (INDEPENDENT_AMBULATORY_CARE_PROVIDER_SITE_OTHER): Payer: Self-pay | Admitting: *Deleted

## 2009-03-18 ENCOUNTER — Encounter: Payer: Self-pay | Admitting: Cardiology

## 2009-03-18 ENCOUNTER — Encounter (INDEPENDENT_AMBULATORY_CARE_PROVIDER_SITE_OTHER): Payer: Self-pay | Admitting: *Deleted

## 2009-03-18 ENCOUNTER — Ambulatory Visit: Payer: Self-pay | Admitting: Cardiovascular Disease

## 2009-03-18 ENCOUNTER — Encounter: Payer: Self-pay | Admitting: Adult Health

## 2009-03-18 LAB — CONVERTED CEMR LAB
Basophils Absolute: 0.1 10*3/uL
MCV: 89.1 fL
Monocytes Absolute: 0.9 10*3/uL
Monocytes Relative: 14 %

## 2009-03-20 ENCOUNTER — Telehealth (INDEPENDENT_AMBULATORY_CARE_PROVIDER_SITE_OTHER): Payer: Self-pay | Admitting: *Deleted

## 2009-03-24 ENCOUNTER — Encounter (INDEPENDENT_AMBULATORY_CARE_PROVIDER_SITE_OTHER): Payer: Self-pay | Admitting: *Deleted

## 2009-03-24 LAB — CONVERTED CEMR LAB
Eosinophils Relative: 3 % (ref 0–5)
Hemoglobin: 12.1 g/dL (ref 12.0–15.0)
Lymphocytes Relative: 33 % (ref 12–46)
Lymphs Abs: 2.2 10*3/uL (ref 0.7–4.0)
MCHC: 32.9 g/dL (ref 30.0–36.0)
MCV: 89.1 fL (ref 78.0–100.0)
Monocytes Relative: 14 % — ABNORMAL HIGH (ref 3–12)
Platelets: 260 10*3/uL (ref 150–400)
RDW: 14.4 % (ref 11.5–15.5)

## 2009-03-25 ENCOUNTER — Ambulatory Visit (HOSPITAL_COMMUNITY): Admission: RE | Admit: 2009-03-25 | Discharge: 2009-03-25 | Payer: Self-pay | Admitting: Cardiology

## 2009-03-25 IMAGING — US US CAROTID DUPLEX BILAT
1 series · 13 of 24 positions shown · non-contrast
Comparison: None

CLINICAL DATA: Carotid bruit.  Confusion, memory loss.  Diabetes,
hypertension.  Previous coronary bypass grafting.

BILATERAL CAROTID DUPLEX ULTRASOUND
TECHNIQUE: Gray scale imaging, color Doppler and duplex ultrasound
was performed of bilateral carotid and vertebral arteries in the
neck.

[Series 1: us carotid duplex bilat · 0.08mm/px · 13 of 69 slices shown]
[im 1/69]
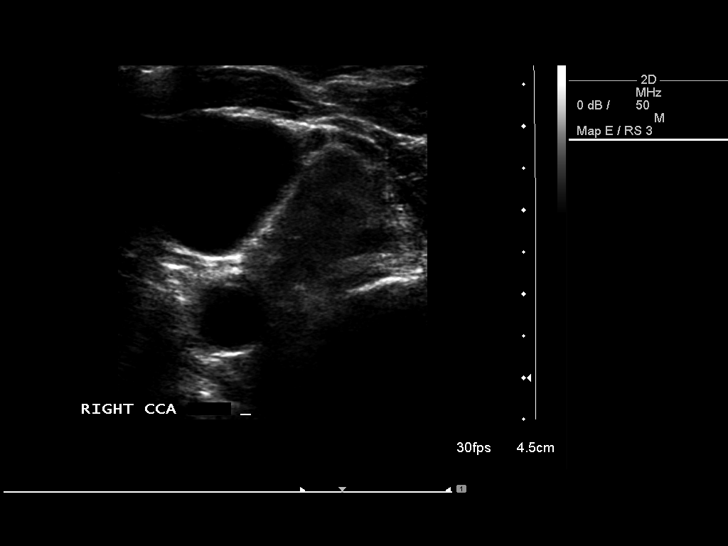
[im 6/69]
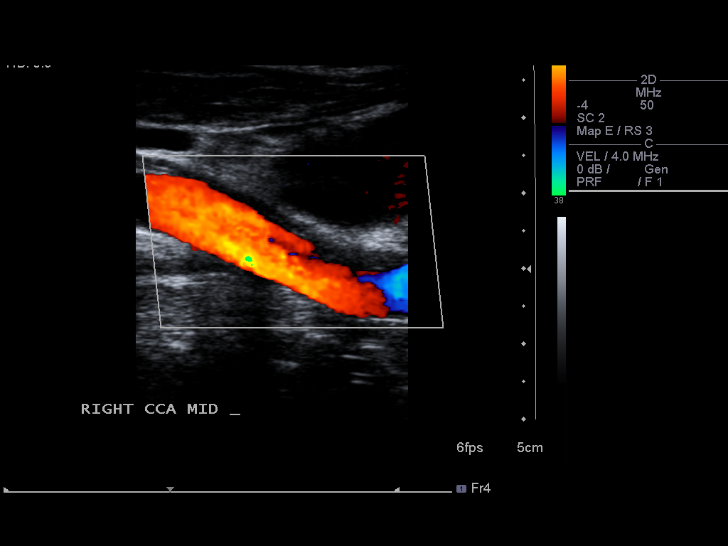
[im 12/69]
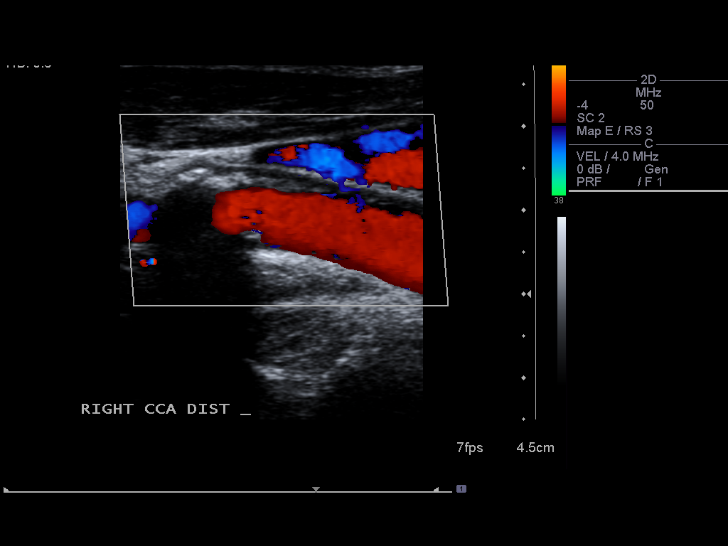
[im 18/69]
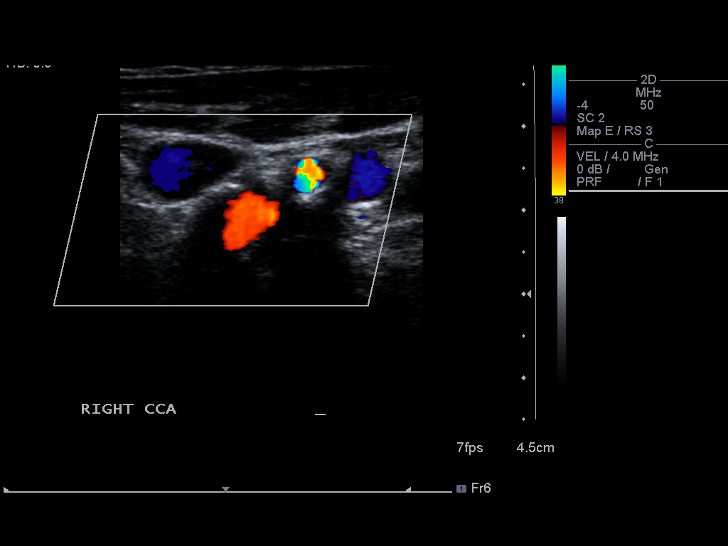
[im 24/69]
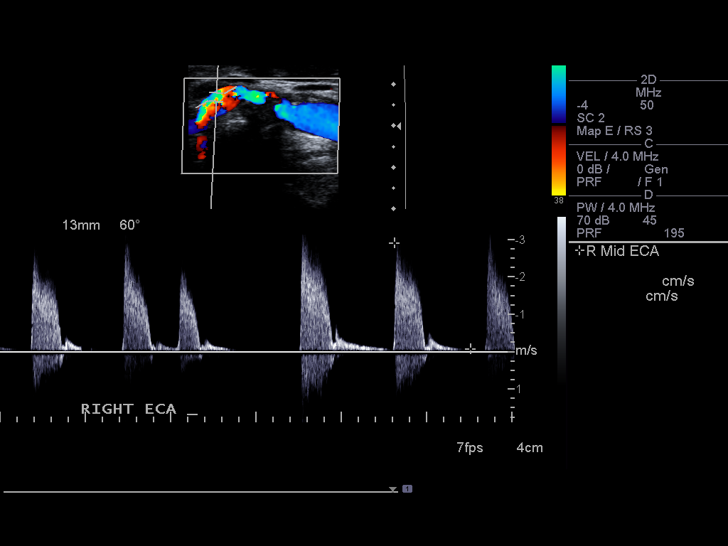
[im 30/69]
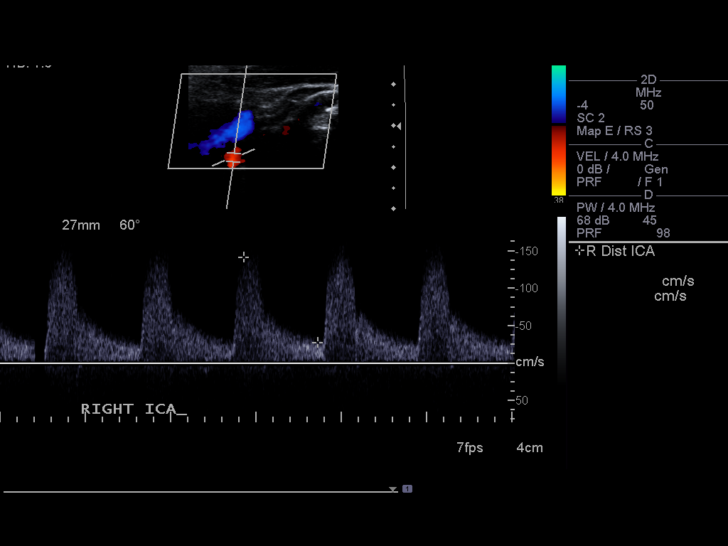
[im 36/69]
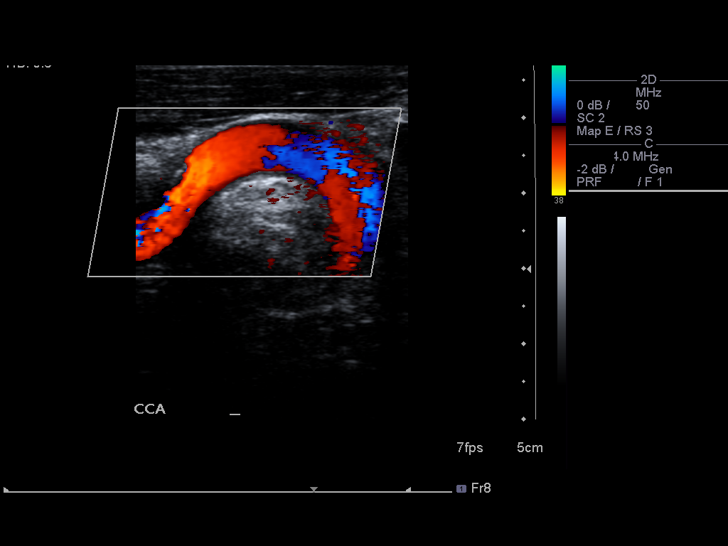
[im 39/69]
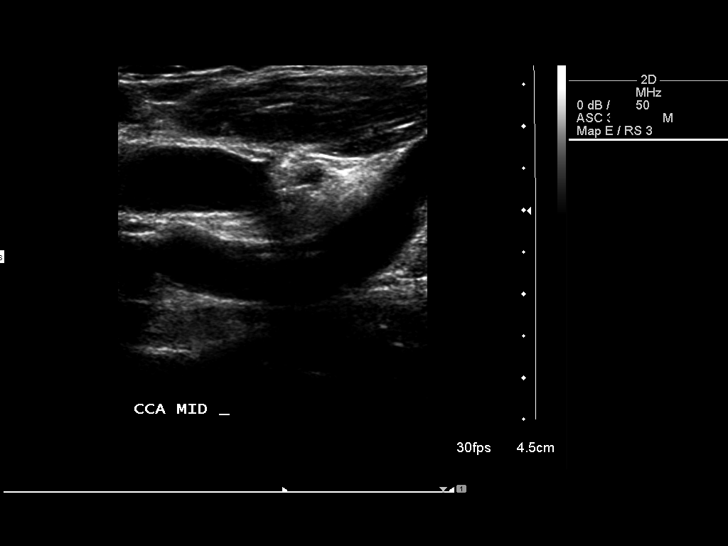
[im 45/69]
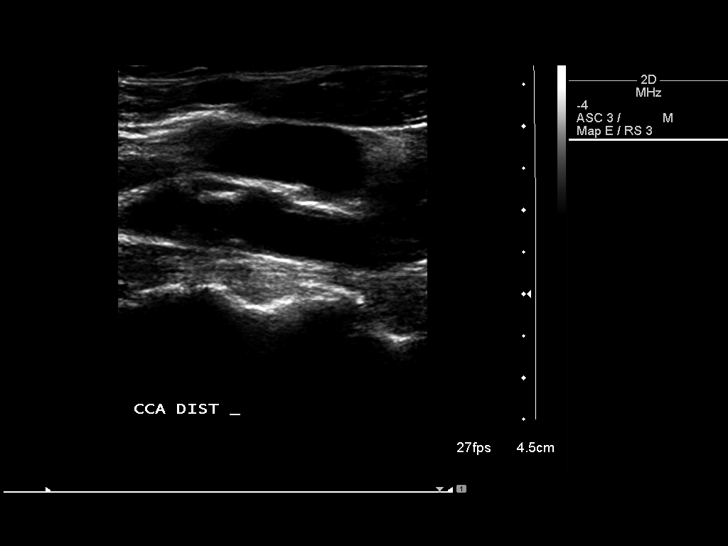
[im 51/69]
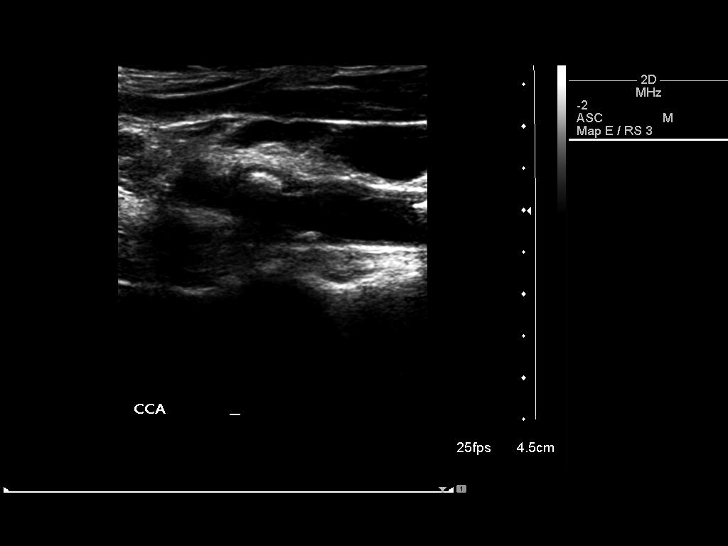
[im 57/69]
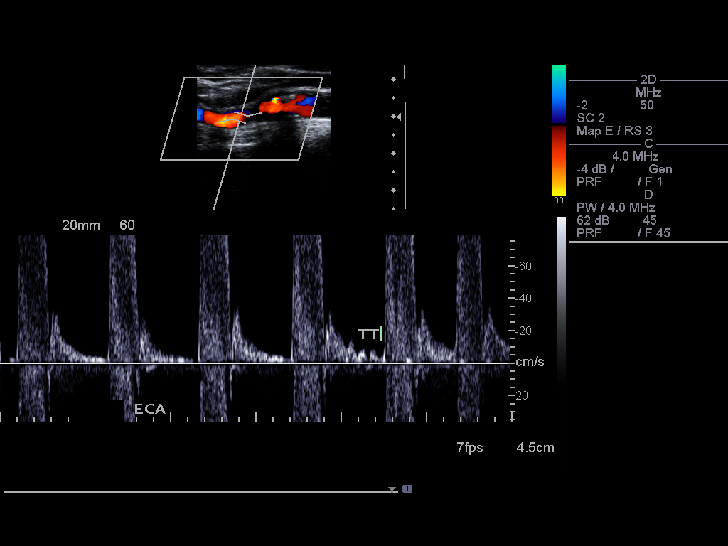
[im 63/69]
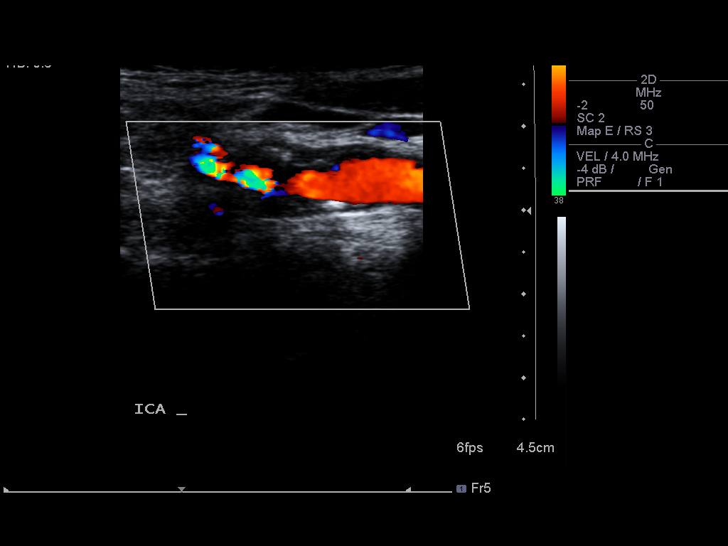
[im 69/69]
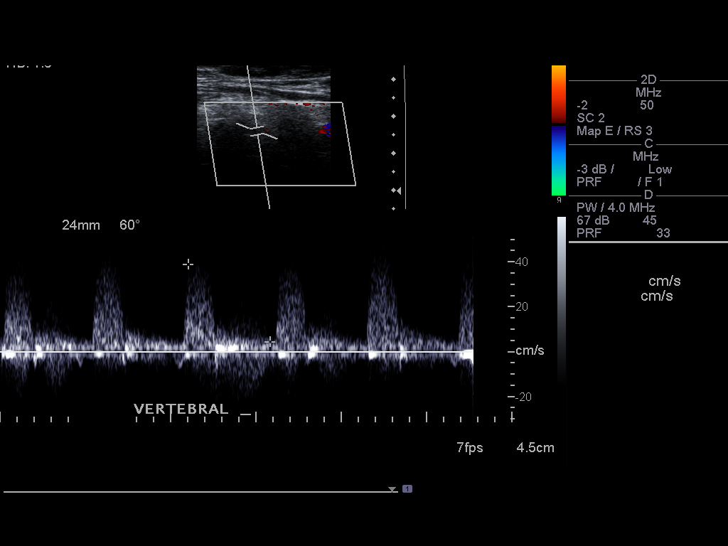

[13 of 24 positions shown; findings below may reference images not displayed]

Criteria:  Quantification of carotid stenosis is based on velocity
parameters that correlate the residual internal carotid diameter
with NASCET-based stenosis levels, using the diameter of the distal
internal carotid lumen as the denominator for stenosis measurement.

The following velocity measurements were obtained:

                 PEAK SYSTOLIC/END DIASTOLIC
RIGHT
ICA:                        150/26cm/sec
CCA:                        113/15cm/sec
SYSTOLIC ICA/CCA RATIO:
DIASTOLIC ICA/CCA RATIO:
ECA:                        290/7cm/sec

LEFT
ICA:                        224/30cm/sec
CCA:                        107/15cm/sec
SYSTOLIC ICA/CCA RATIO:
DIASTOLIC ICA/CCA RATIO:
ECA:                        200/4cm/sec
FINDINGS: RIGHT CAROTID ARTERY: There is eccentric calcified plaque in the
common carotid artery and effacing the bulb.  Heavy calcification
limits visualization of portions of the lumen.  There is mild
tortuosity of the external carotid artery with elevated peak
systolic velocities at the ECA origin and associated focal aliasing
on color Doppler interrogation.  Normal wave forms in the ICA.

RIGHT VERTEBRAL ARTERY:  Normal flow direction and wave form.

LEFT CAROTID ARTERY: Tortuous common carotid artery with scattered
eccentric partially calcified plaque.  More heavily calcified
plaque effaces the carotid bulb and extends into the proximal
internal and external carotid arteries with mildly elevated peak
systolic velocities.

LEFT VERTEBRAL ARTERY:  Normal flow direction and wave form.
IMPRESSION: 1.  Bilateral carotid bifurcation plaque, resulting in 50-69%
diameter stenosis bilaterally. The exam does not exclude plaque
ulceration or embolization.  Continued surveillance recommended.

## 2009-03-30 ENCOUNTER — Ambulatory Visit: Payer: Self-pay | Admitting: Cardiology

## 2009-03-30 LAB — CONVERTED CEMR LAB: OCCULT 2: NEGATIVE

## 2009-05-06 ENCOUNTER — Ambulatory Visit: Payer: Self-pay | Admitting: Vascular Surgery

## 2009-05-07 ENCOUNTER — Telehealth (INDEPENDENT_AMBULATORY_CARE_PROVIDER_SITE_OTHER): Payer: Self-pay

## 2009-05-15 ENCOUNTER — Telehealth (INDEPENDENT_AMBULATORY_CARE_PROVIDER_SITE_OTHER): Payer: Self-pay

## 2009-05-15 IMAGING — CR DG CHEST 2V
2 series · 2 of 2 positions shown · non-contrast
Comparison: [DATE]

CLINICAL DATA: Chest pain.  Short of breath.  Previous CABG.

CHEST - 2 VIEW

[w chest pa]
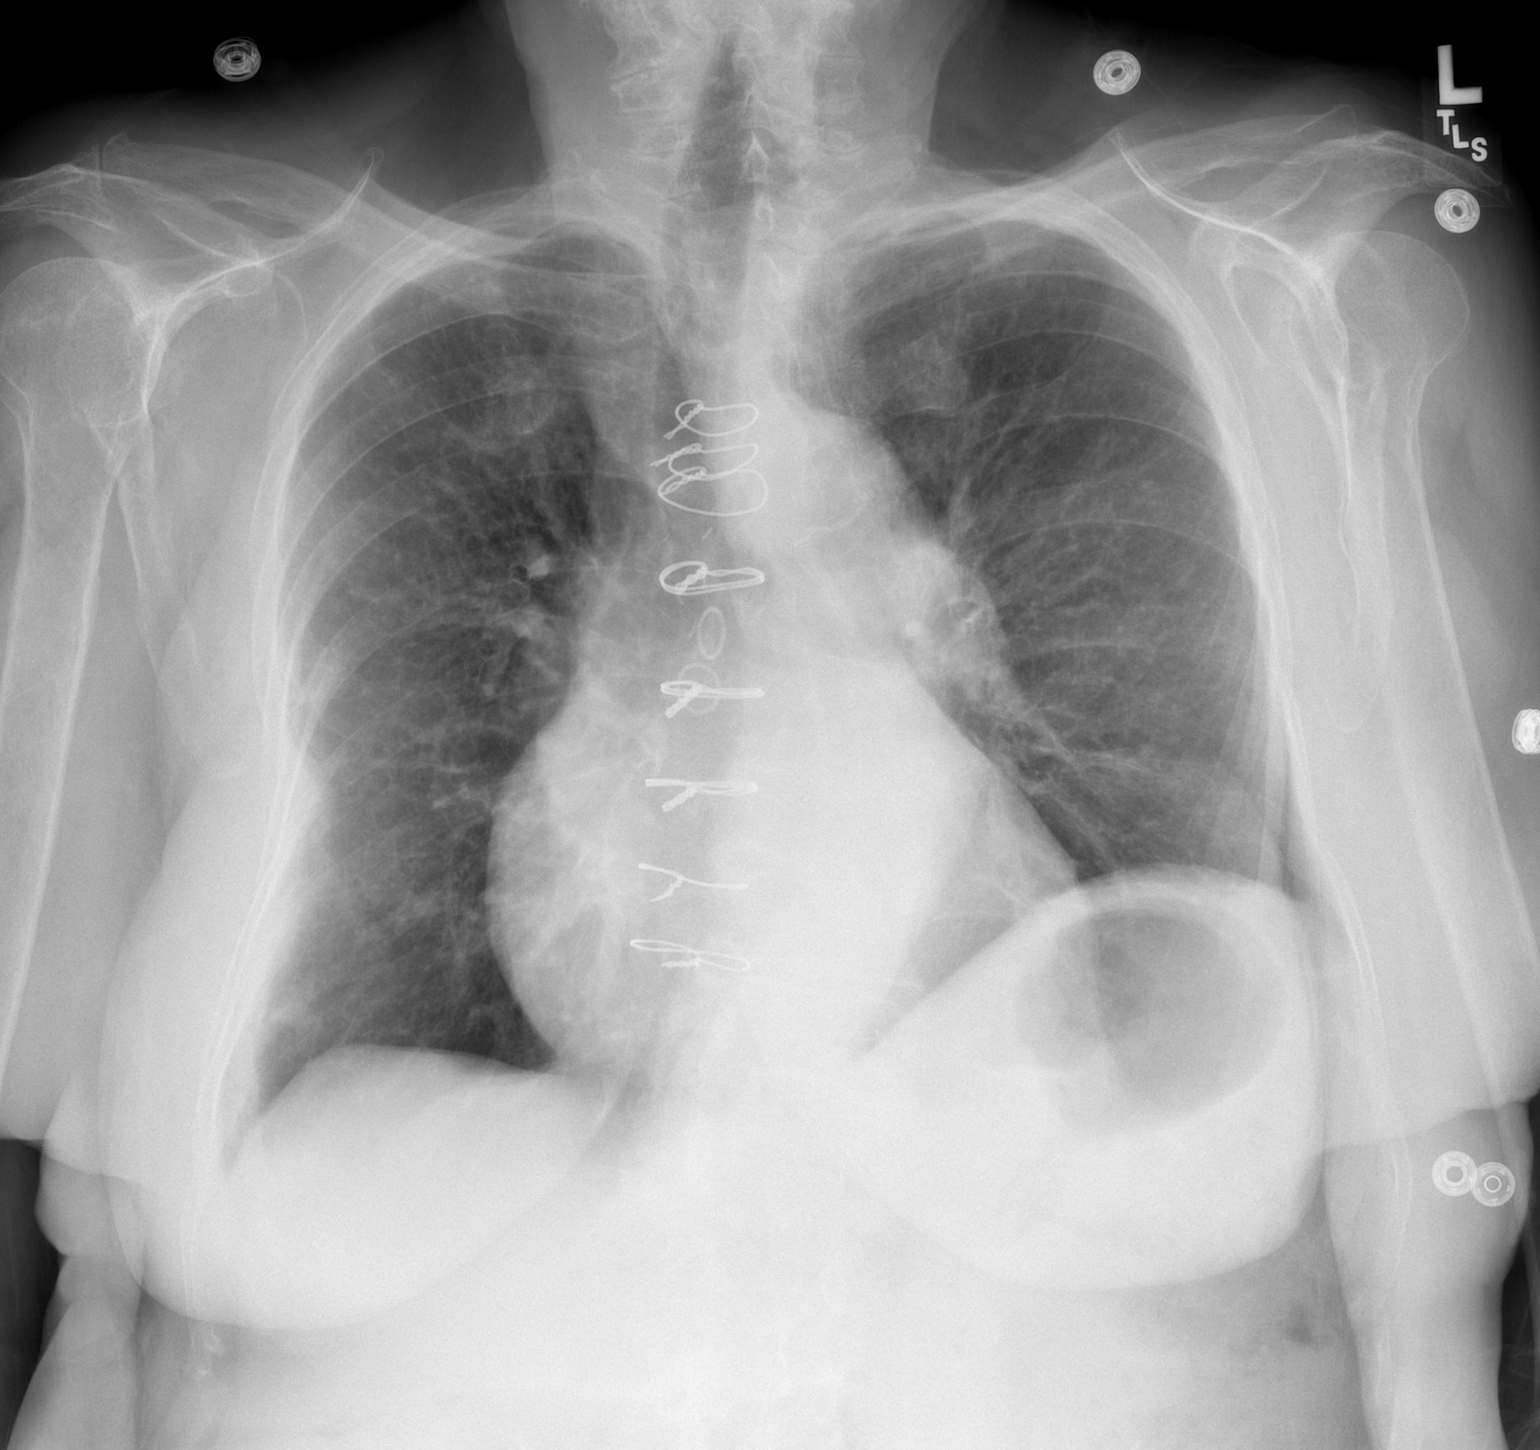

[w chest lat *]
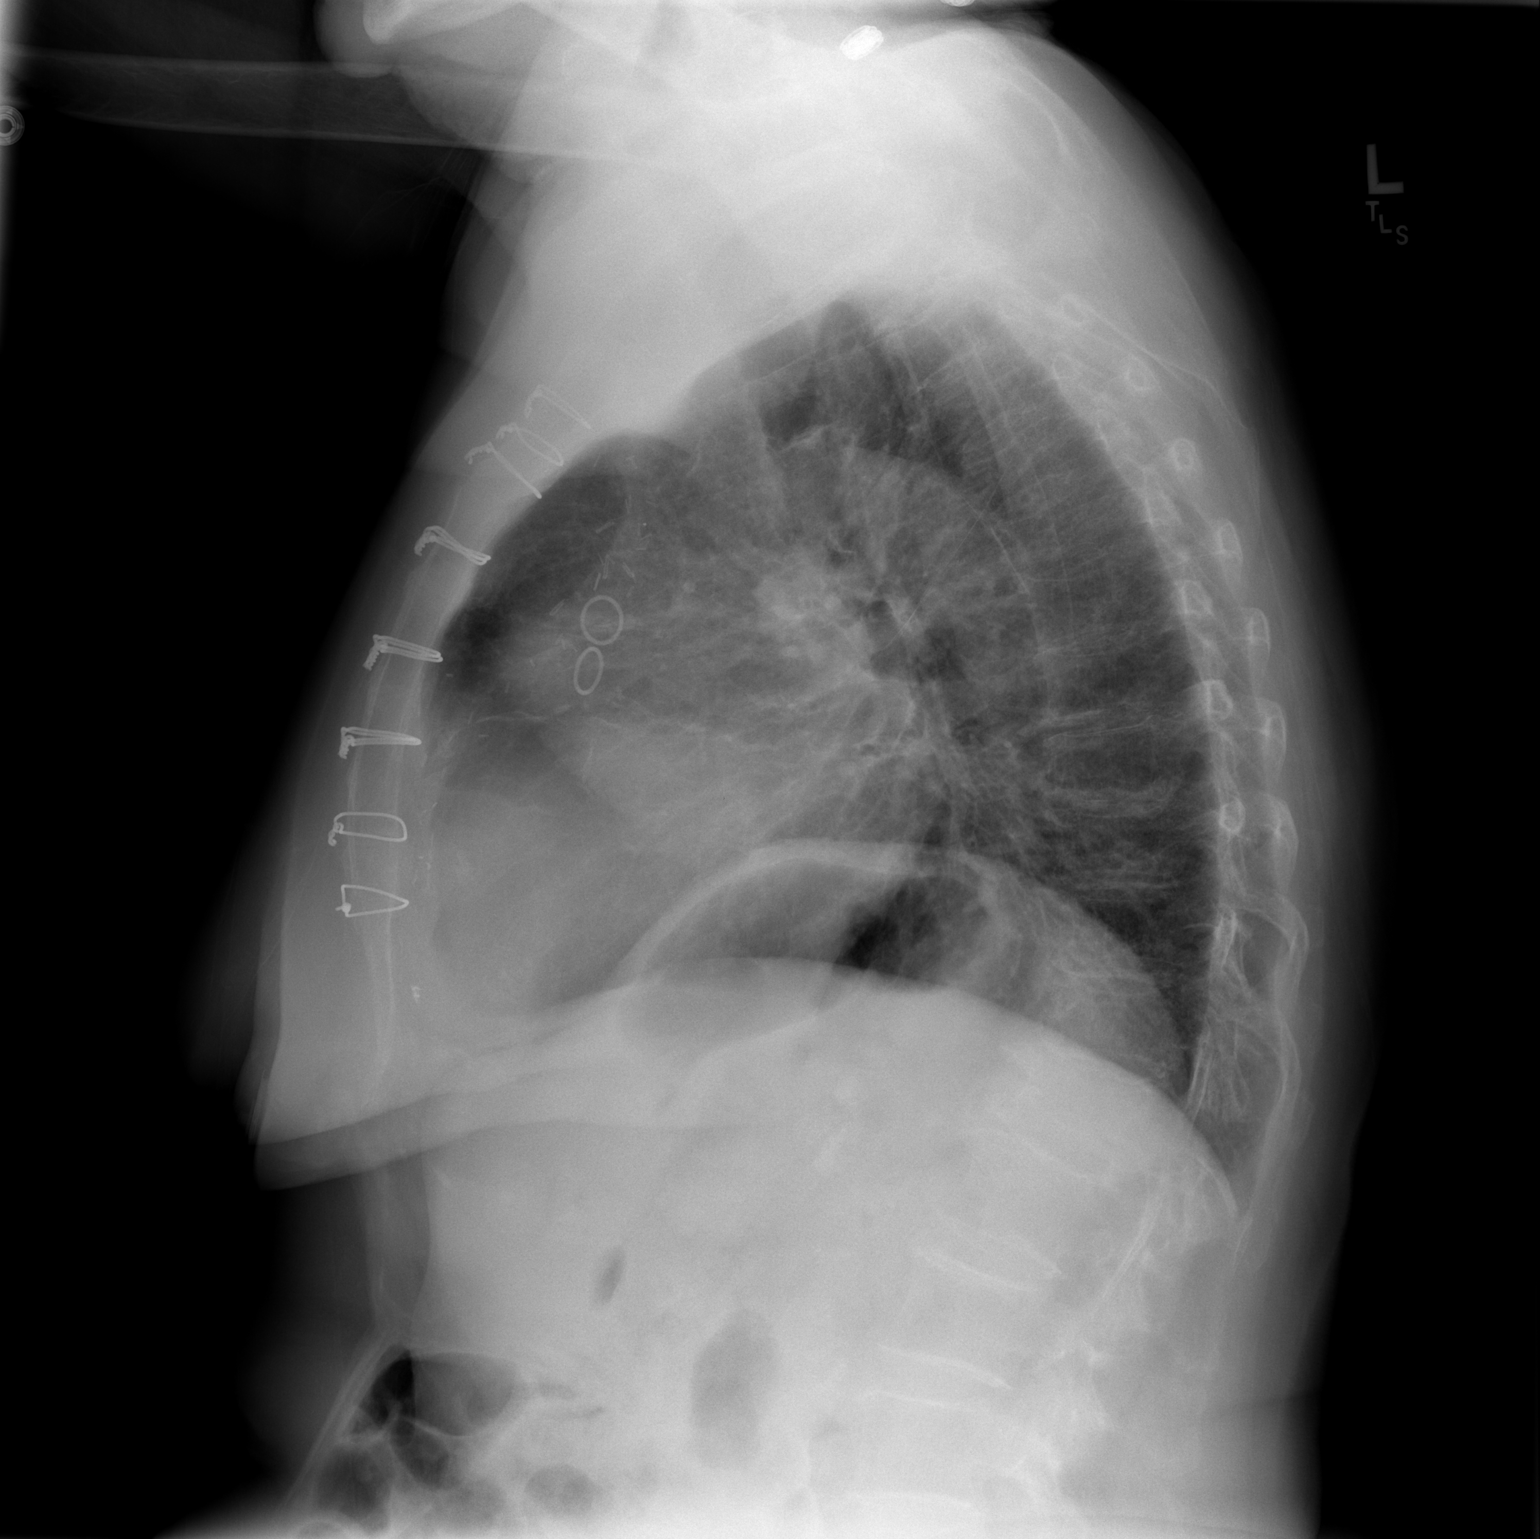

[2 of 2 positions shown; findings below may reference images not displayed]

FINDINGS: There is been previous median sternotomy and CABG.  The
heart is mildly enlarged.  The aorta is unfolded.  There is chronic
elevation of the left hemidiaphragm.  There is chronic interstitial
pulmonary disease without evidence of acute infiltrate, mass,
effusion or collapse.  Ordinary degenerative changes effect the
spine.
IMPRESSION: Prior CABG.  Chronic lung disease.  No acute finding.

## 2009-05-16 ENCOUNTER — Inpatient Hospital Stay (HOSPITAL_COMMUNITY): Admission: EM | Admit: 2009-05-16 | Discharge: 2009-05-17 | Payer: Self-pay | Admitting: Emergency Medicine

## 2009-05-16 ENCOUNTER — Ambulatory Visit: Payer: Self-pay | Admitting: Internal Medicine

## 2009-05-16 ENCOUNTER — Encounter (INDEPENDENT_AMBULATORY_CARE_PROVIDER_SITE_OTHER): Payer: Self-pay | Admitting: *Deleted

## 2009-05-16 LAB — CONVERTED CEMR LAB: Free T4: 1.18 ng/dL

## 2009-05-19 ENCOUNTER — Encounter: Payer: Self-pay | Admitting: Adult Health

## 2009-05-19 ENCOUNTER — Ambulatory Visit: Payer: Self-pay | Admitting: Cardiology

## 2009-05-23 ENCOUNTER — Encounter: Payer: Self-pay | Admitting: Internal Medicine

## 2009-05-27 LAB — CONVERTED CEMR LAB
CO2: 30 meq/L
Chloride: 10 meq/L
Glucose, Bld: 106 mg/dL
HCT: 30.5 %
MCV: 80.3 fL
Potassium: 3.4 meq/L
Total Protein: 7.6 g/dL
WBC: 8.6 10*3/uL

## 2009-05-28 ENCOUNTER — Ambulatory Visit (HOSPITAL_COMMUNITY): Admission: RE | Admit: 2009-05-28 | Discharge: 2009-05-28 | Payer: Self-pay | Admitting: Cardiology

## 2009-05-28 IMAGING — US US PELVIS COMPLETE
1 series · 13 of 25 positions shown · non-contrast
Comparison: CT [DATE]

CLINICAL DATA: Evaluate pre and post void bladder volume.  Renal
artery stenosis with difficulty voiding.

TRANSABDOMINAL ULTRASOUND OF PELVIS
TECHNIQUE: Transabdominal ultrasound examination of the pelvis was
performed including evaluation of the uterus, ovaries, adnexal
regions, and pelvic cul-de-sac.

[Series 1: us pelvis complete · 0.32mm/px · 34 acquisitions, 13 frames shown]
[im 1/34]
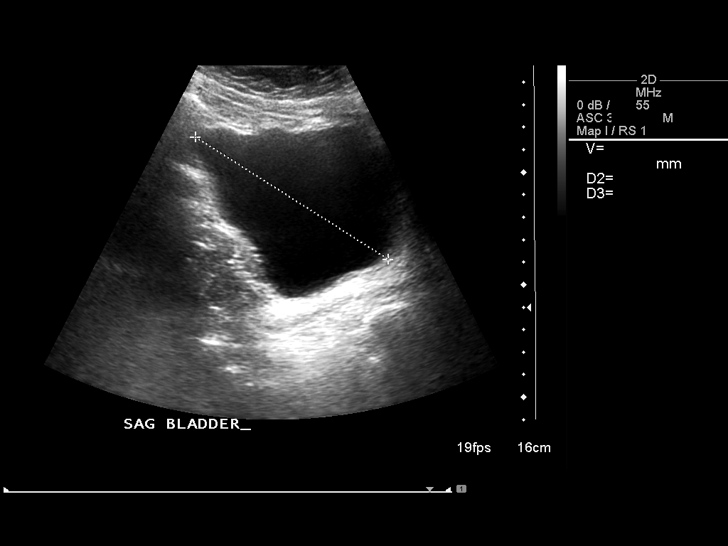
[im 3/34]
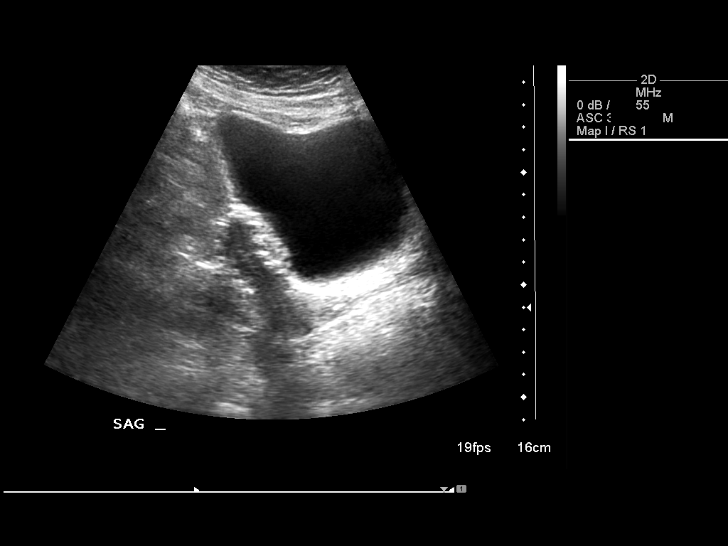
[im 6/34]
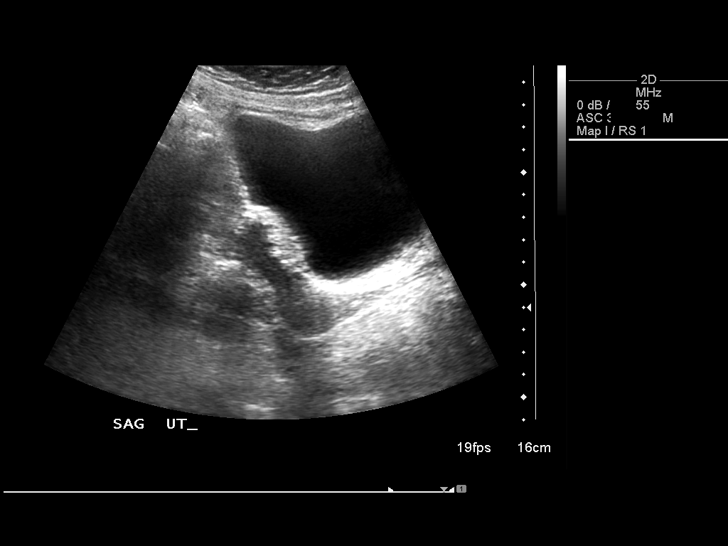
[im 9/34]
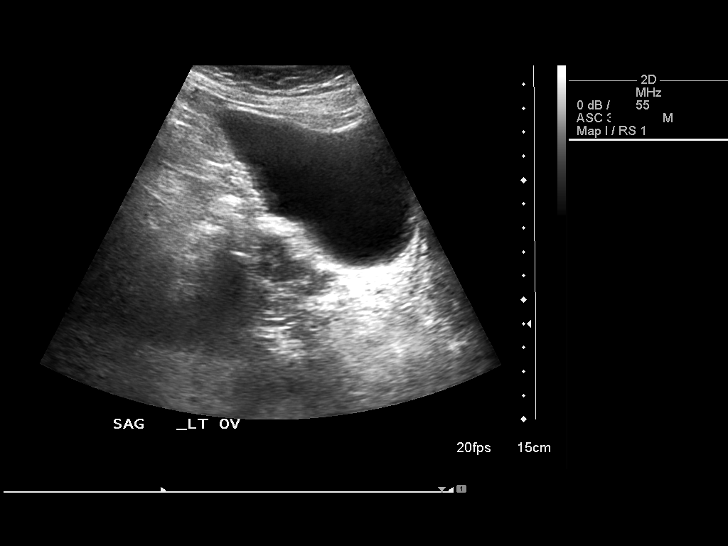
[im 12/34]
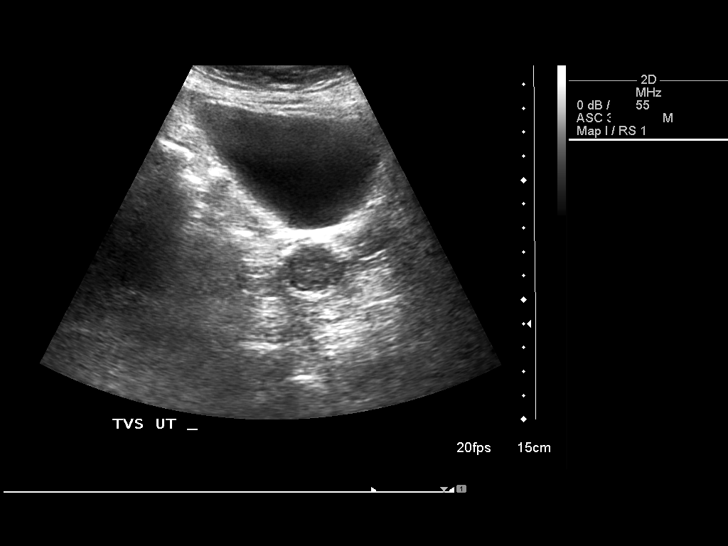
[im 14/34]
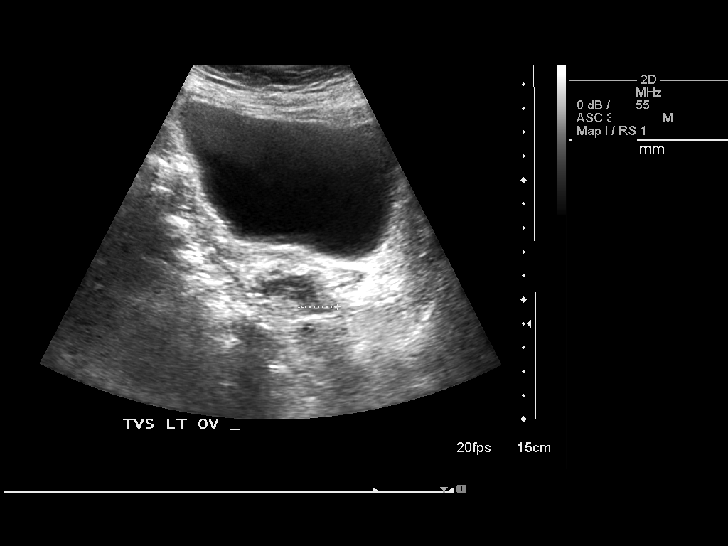
[im 17/34]
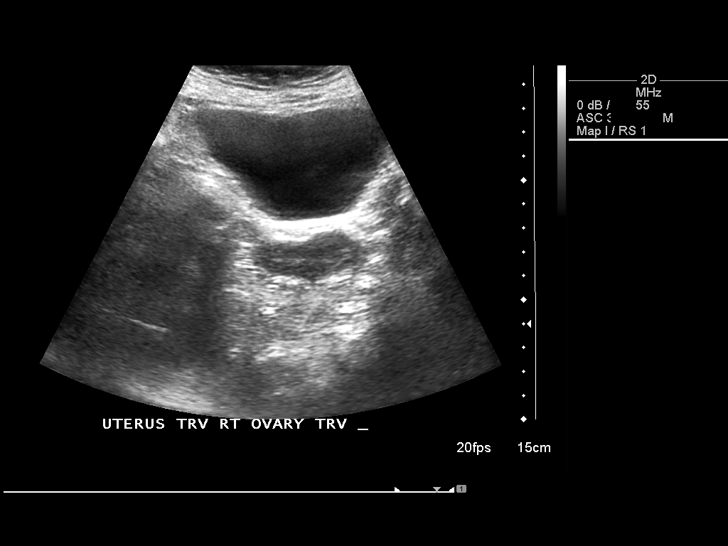
[im 20/34]
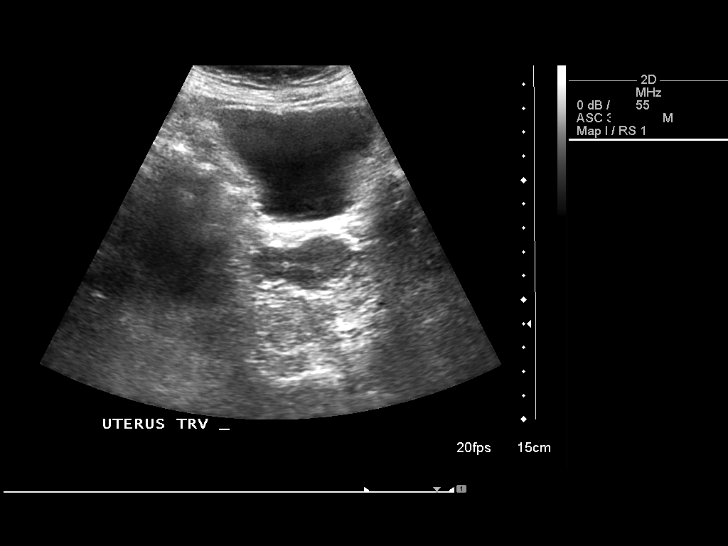
[im 23/34]
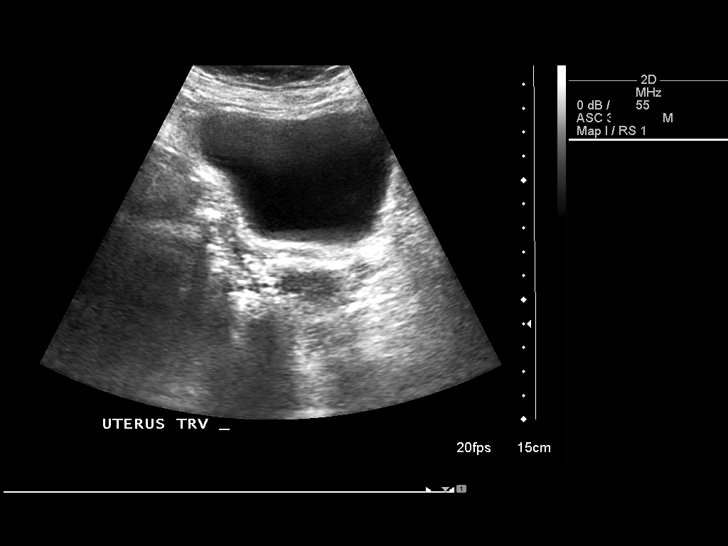
[im 25/34]
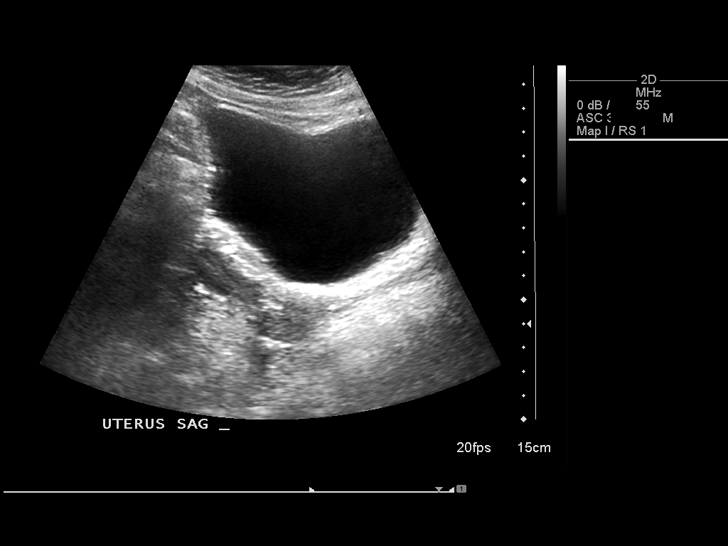
[im 28/34]
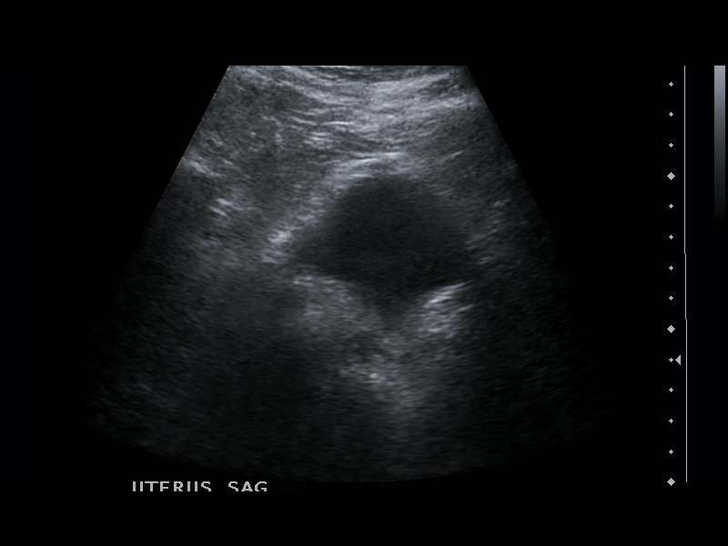
[im 31/34]
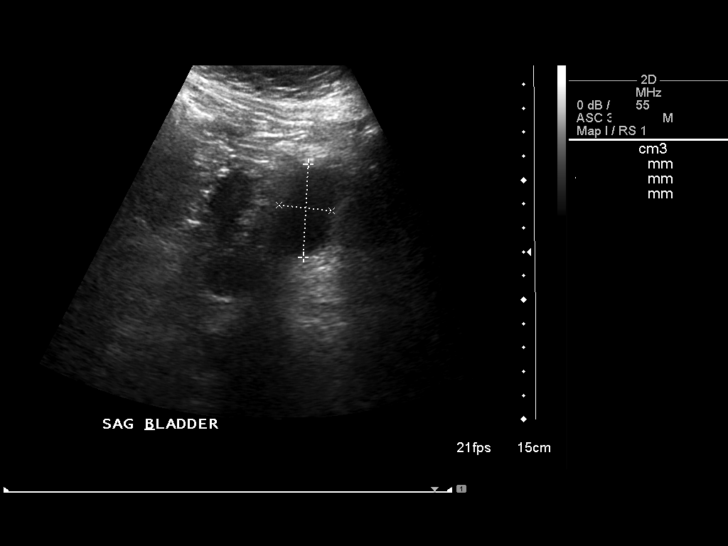
[im 34/34]
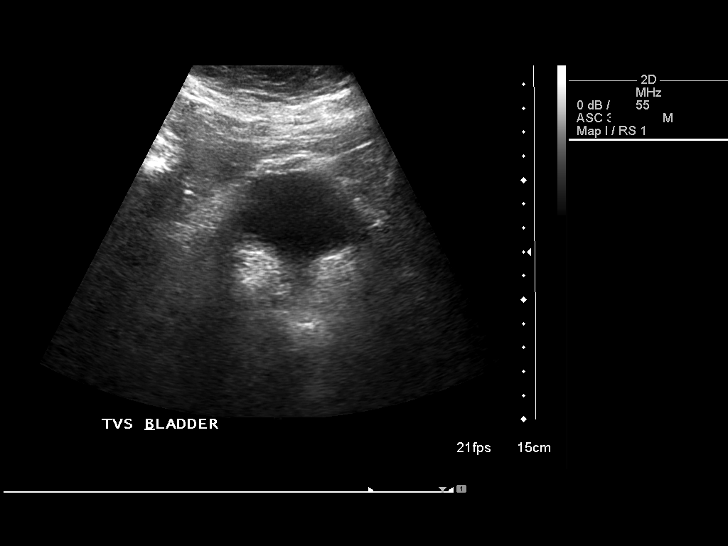

[13 of 25 positions shown; findings below may reference images not displayed]

FINDINGS: Uterus has a normal postmenopausal configuration with a sagittal
length of 6.6 cm, an AP width of 2.5 cm and a transverse width of
3.0 cm.  Calcification of the anterior myometrial vasculature is
seen and would correlate with a similar appearance on prior CT.  A
homogeneous uterine myometrium is otherwise suggested
transabdominally.

Endometrium appears thin and echogenic with an AP width of 1.8 mm
compatible with the patient's postmenopausal status

Right Ovary has a normal transabdominal appearance measuring 1.8 x
1.0 x 1.4 cm

Left Ovary has a normal transabdominal appearance measuring 1.6 x
1.0 x 1.5 cm

Other Findings:  The bladder demonstrates a prevoid volume of
cubic centimeters and a post void volume of 23.6 cubic centimeters.

No pelvic fluid or separate adnexal masses are suggested.
IMPRESSION: Normal transabdominal postmenopausal uterine myometrium,
endometrium and ovaries.

Pre and post void bladder volumes as noted above.

## 2009-06-02 ENCOUNTER — Telehealth (INDEPENDENT_AMBULATORY_CARE_PROVIDER_SITE_OTHER): Payer: Self-pay | Admitting: *Deleted

## 2009-06-03 ENCOUNTER — Encounter: Payer: Self-pay | Admitting: Internal Medicine

## 2009-06-04 ENCOUNTER — Telehealth: Payer: Self-pay | Admitting: Adult Health

## 2009-06-05 ENCOUNTER — Telehealth (INDEPENDENT_AMBULATORY_CARE_PROVIDER_SITE_OTHER): Payer: Self-pay | Admitting: *Deleted

## 2009-06-08 ENCOUNTER — Encounter: Payer: Self-pay | Admitting: Internal Medicine

## 2009-06-25 DIAGNOSIS — K559 Vascular disorder of intestine, unspecified: Secondary | ICD-10-CM | POA: Insufficient documentation

## 2009-06-25 DIAGNOSIS — I679 Cerebrovascular disease, unspecified: Secondary | ICD-10-CM | POA: Insufficient documentation

## 2009-06-26 ENCOUNTER — Ambulatory Visit: Payer: Self-pay | Admitting: Cardiology

## 2009-06-26 ENCOUNTER — Encounter (INDEPENDENT_AMBULATORY_CARE_PROVIDER_SITE_OTHER): Payer: Self-pay | Admitting: *Deleted

## 2009-06-26 DIAGNOSIS — E039 Hypothyroidism, unspecified: Secondary | ICD-10-CM | POA: Insufficient documentation

## 2009-06-29 ENCOUNTER — Encounter: Payer: Self-pay | Admitting: Cardiology

## 2009-07-06 ENCOUNTER — Telehealth: Payer: Self-pay | Admitting: Cardiology

## 2009-07-08 ENCOUNTER — Telehealth: Payer: Self-pay | Admitting: Cardiology

## 2009-07-21 ENCOUNTER — Encounter: Payer: Self-pay | Admitting: Internal Medicine

## 2009-07-27 ENCOUNTER — Encounter (INDEPENDENT_AMBULATORY_CARE_PROVIDER_SITE_OTHER): Payer: Self-pay

## 2009-07-27 LAB — CONVERTED CEMR LAB
Alkaline Phosphatase: 99 units/L
Bilirubin, Direct: 0.5 mg/dL
CO2: 27 meq/L
Chloride: 107 meq/L
Cholesterol: 153 mg/dL
Glomerular Filtration Rate, Af Am: 0.6 mL/min/{1.73_m2}
Glucose, Bld: 117 mg/dL
Potassium: 3.7 meq/L
Total Protein: 7.6 g/dL
Triglycerides: 109 mg/dL

## 2009-08-06 ENCOUNTER — Encounter (INDEPENDENT_AMBULATORY_CARE_PROVIDER_SITE_OTHER): Payer: Self-pay

## 2009-09-24 ENCOUNTER — Encounter (INDEPENDENT_AMBULATORY_CARE_PROVIDER_SITE_OTHER): Payer: Self-pay | Admitting: *Deleted

## 2009-09-24 ENCOUNTER — Ambulatory Visit: Payer: Self-pay | Admitting: Cardiology

## 2009-09-24 ENCOUNTER — Ambulatory Visit (HOSPITAL_COMMUNITY): Admission: RE | Admit: 2009-09-24 | Discharge: 2009-09-24 | Payer: Self-pay | Admitting: Cardiology

## 2009-09-24 LAB — CONVERTED CEMR LAB
Alkaline Phosphatase: 99 units/L
CO2: 27 meq/L
Chloride: 107 meq/L
Cholesterol: 153 mg/dL
Creatinine, Ser: 0.95 mg/dL
Glucose, Bld: 117 mg/dL
LDL Cholesterol: 94 mg/dL
Potassium: 3.7 meq/L
Sodium: 141 meq/L
Total Protein: 7.6 g/dL
Triglycerides: 109 mg/dL

## 2009-09-24 IMAGING — CR DG CHEST 2V
2 series · 2 of 2 positions shown · non-contrast
Comparison: [DATE]

CLINICAL DATA: CHF

CHEST - 2 VIEW

[view not recorded (1 of 2)]
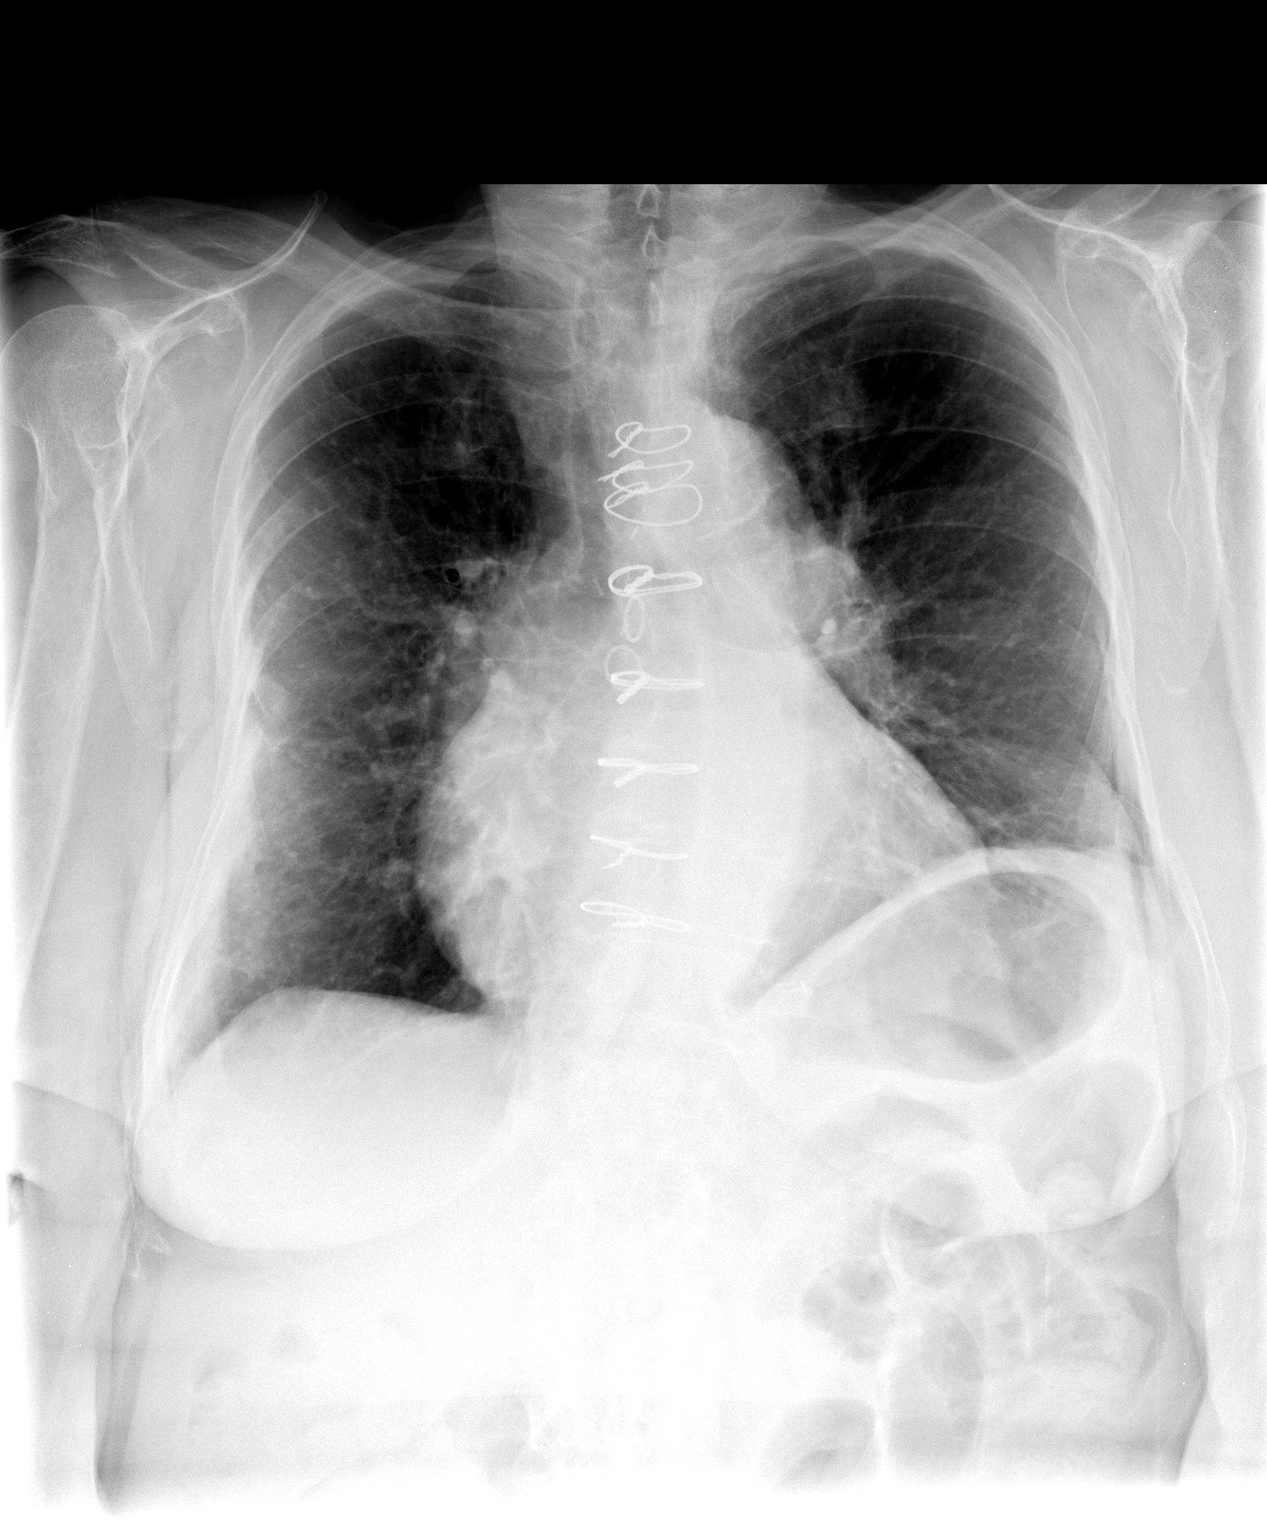

[view not recorded (2 of 2)]
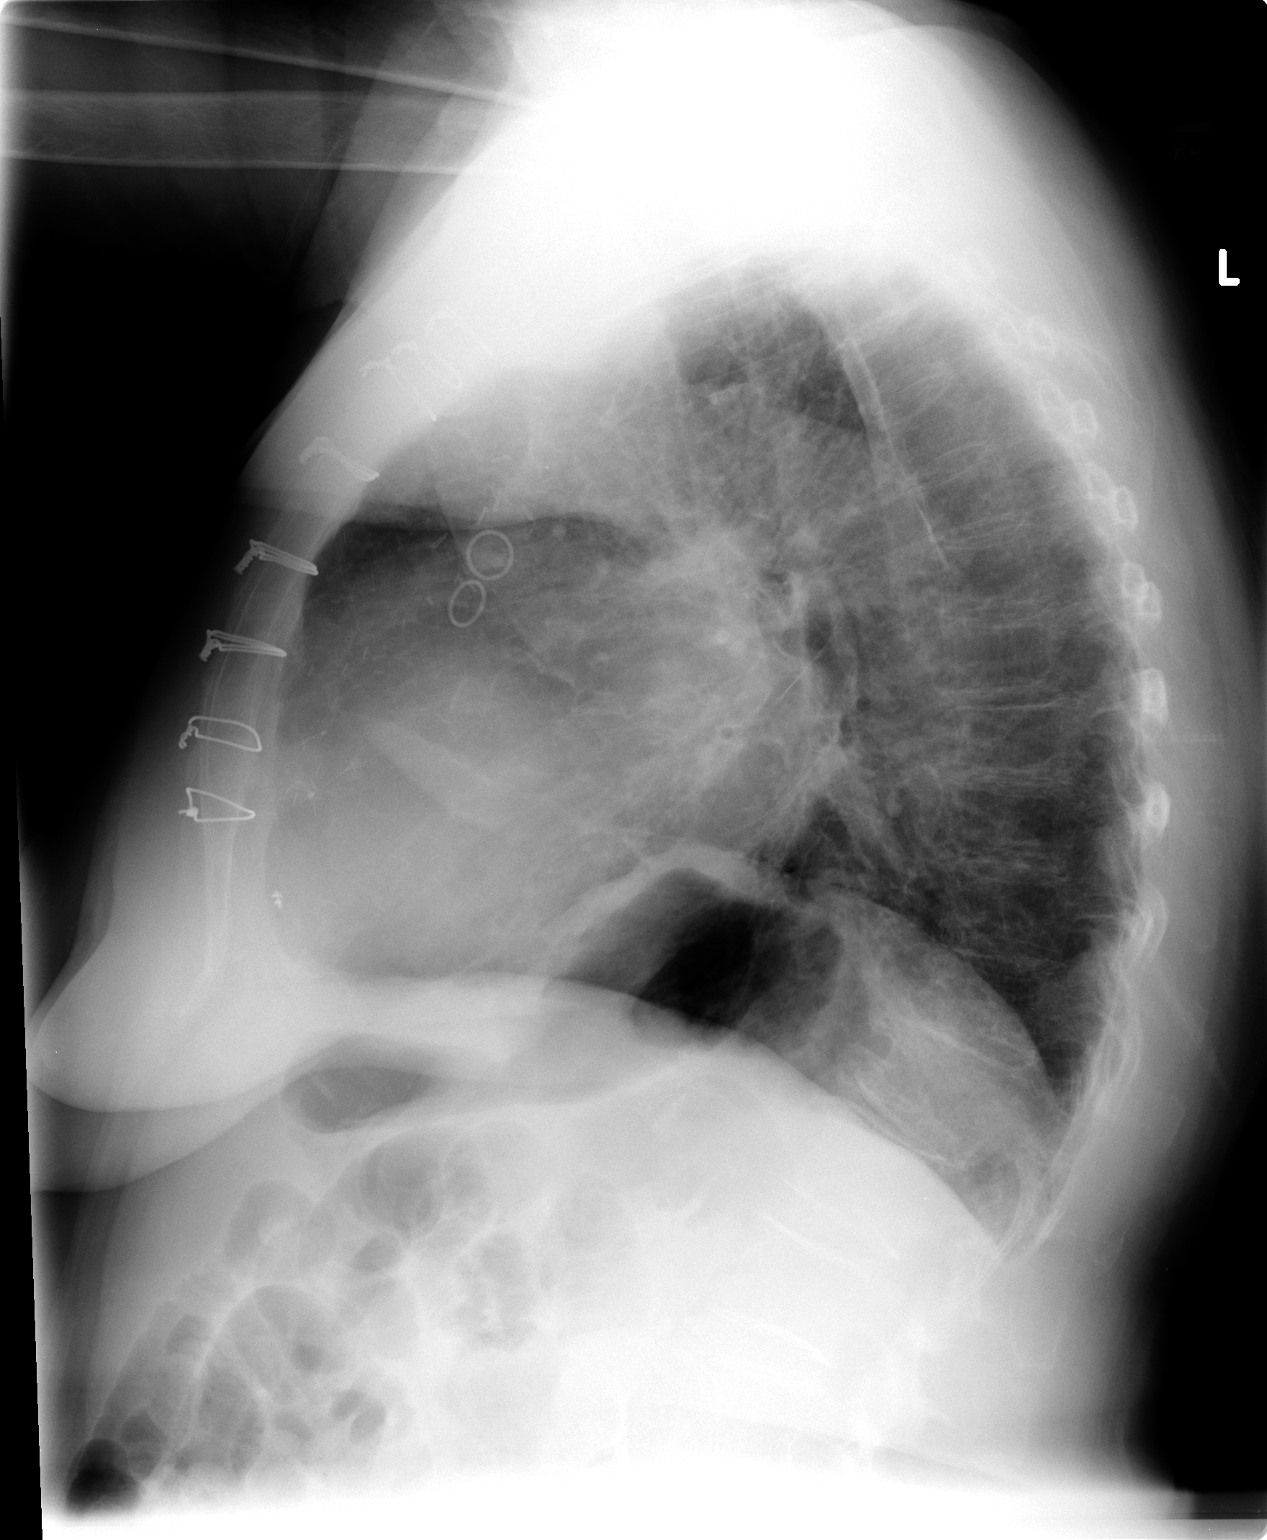

[2 of 2 positions shown; findings below may reference images not displayed]

FINDINGS: There are postsurgical changes of median sternotomy for
CABG.  Chronic elevation of the left hemidiaphragm.  Stable mild to
moderate cardiomegaly.  Pulmonary vascularity is within normal
limits.  Mild diffuse interstitial prominence is stable and likely
chronic.  Question mild pleuroparenchymal thickening along the
lateral right hemithorax, unchanged from [DATE].  No
discrete pleural effusion.  No focal airspace disease or
pneumothorax.  The bones are demineralized.  No acute osseous
abnormality is seen.
IMPRESSION: Stable cardiomegaly.  Mild chronic interstitial prominence.
Negative for pulmonary edema or other acute findings.

## 2009-09-28 ENCOUNTER — Encounter: Payer: Self-pay | Admitting: Cardiology

## 2009-10-01 ENCOUNTER — Encounter: Payer: Self-pay | Admitting: Cardiology

## 2009-10-01 ENCOUNTER — Encounter (INDEPENDENT_AMBULATORY_CARE_PROVIDER_SITE_OTHER): Payer: Self-pay | Admitting: *Deleted

## 2009-10-01 ENCOUNTER — Telehealth (INDEPENDENT_AMBULATORY_CARE_PROVIDER_SITE_OTHER): Payer: Self-pay

## 2009-10-13 ENCOUNTER — Telehealth (INDEPENDENT_AMBULATORY_CARE_PROVIDER_SITE_OTHER): Payer: Self-pay | Admitting: *Deleted

## 2009-10-15 ENCOUNTER — Encounter (INDEPENDENT_AMBULATORY_CARE_PROVIDER_SITE_OTHER): Payer: Self-pay | Admitting: *Deleted

## 2009-10-15 LAB — CONVERTED CEMR LAB
BUN: 23 mg/dL
CO2: 24 meq/L
Creatinine, Ser: 1.26 mg/dL
Glucose, Bld: 167 mg/dL
Potassium: 3.6 meq/L

## 2009-10-23 ENCOUNTER — Encounter: Payer: Self-pay | Admitting: Cardiology

## 2009-10-26 ENCOUNTER — Telehealth (INDEPENDENT_AMBULATORY_CARE_PROVIDER_SITE_OTHER): Payer: Self-pay | Admitting: *Deleted

## 2009-10-29 ENCOUNTER — Ambulatory Visit: Payer: Self-pay | Admitting: Cardiology

## 2009-11-18 ENCOUNTER — Encounter (INDEPENDENT_AMBULATORY_CARE_PROVIDER_SITE_OTHER): Payer: Self-pay | Admitting: *Deleted

## 2009-11-24 ENCOUNTER — Ambulatory Visit: Payer: Self-pay | Admitting: Cardiology

## 2009-11-24 ENCOUNTER — Encounter: Payer: Self-pay | Admitting: Internal Medicine

## 2009-11-24 ENCOUNTER — Encounter: Payer: Self-pay | Admitting: Adult Health

## 2009-11-24 DIAGNOSIS — R0989 Other specified symptoms and signs involving the circulatory and respiratory systems: Secondary | ICD-10-CM | POA: Insufficient documentation

## 2009-11-24 DIAGNOSIS — R300 Dysuria: Secondary | ICD-10-CM | POA: Insufficient documentation

## 2009-11-24 LAB — CONVERTED CEMR LAB
Bilirubin Urine: NEGATIVE
Casts: NONE SEEN /lpf
Crystals: NONE SEEN
Hemoglobin, Urine: NEGATIVE
Ketones, ur: NEGATIVE mg/dL
Protein, ur: NEGATIVE mg/dL
RBC / HPF: NONE SEEN (ref <3)
Specific Gravity, Urine: 1.007 (ref 1.005–1.030)
Urobilinogen, UA: 0.2 (ref 0.0–1.0)
pH: 6 (ref 5.0–8.0)

## 2009-11-25 ENCOUNTER — Encounter: Payer: Self-pay | Admitting: Adult Health

## 2009-11-25 ENCOUNTER — Encounter: Payer: Self-pay | Admitting: Cardiology

## 2009-11-26 ENCOUNTER — Ambulatory Visit (HOSPITAL_COMMUNITY): Admission: RE | Admit: 2009-11-26 | Discharge: 2009-11-26 | Payer: Self-pay | Admitting: Cardiology

## 2009-11-30 ENCOUNTER — Telehealth (INDEPENDENT_AMBULATORY_CARE_PROVIDER_SITE_OTHER): Payer: Self-pay | Admitting: *Deleted

## 2009-12-10 ENCOUNTER — Ambulatory Visit: Payer: Self-pay | Admitting: Vascular Surgery

## 2009-12-10 ENCOUNTER — Encounter: Payer: Self-pay | Admitting: Cardiology

## 2010-01-20 ENCOUNTER — Telehealth (INDEPENDENT_AMBULATORY_CARE_PROVIDER_SITE_OTHER): Payer: Self-pay | Admitting: *Deleted

## 2010-01-21 ENCOUNTER — Ambulatory Visit: Payer: Self-pay | Admitting: Cardiology

## 2010-03-06 ENCOUNTER — Inpatient Hospital Stay (HOSPITAL_COMMUNITY): Admission: EM | Admit: 2010-03-06 | Discharge: 2010-03-11 | Payer: Self-pay | Admitting: Internal Medicine

## 2010-03-08 ENCOUNTER — Ambulatory Visit: Payer: Self-pay | Admitting: Internal Medicine

## 2010-03-08 ENCOUNTER — Encounter (INDEPENDENT_AMBULATORY_CARE_PROVIDER_SITE_OTHER): Payer: Self-pay | Admitting: *Deleted

## 2010-03-08 ENCOUNTER — Encounter: Payer: Self-pay | Admitting: Internal Medicine

## 2010-03-08 ENCOUNTER — Ambulatory Visit: Payer: Self-pay | Admitting: Vascular Surgery

## 2010-03-08 IMAGING — CT CT ANGIO ABDOMEN
2 of 8 series · 14 of 46 positions shown, 18 images · IV contrast (APPLIED)
Comparison: [DATE].

CTA ABDOMEN

CLINICAL DATA: GI bleed.  Evaluate mesenteric vasculature.
Evaluate for occlusion.  Possible bowel ischemia.

CT ANGIOGRAPHY ABDOMEN AND PELVIS
TECHNIQUE: Multidetector CT imaging of the abdomen and pelvis was
performed using the standard protocol during bolus administration
of intravenous contrast.  Multiplanar reconstructed images
including MIPs were obtained and reviewed to evaluate the vascular
anatomy.
Contrast:  100 ml Omnipaque 350

[Series 5: abd cta 2.0 (id) · axial · 0.81mm/px · z∈[-606,-228]mm · 11 of 225 slices shown, 15 images]
[im 24/225  soft-tissue]
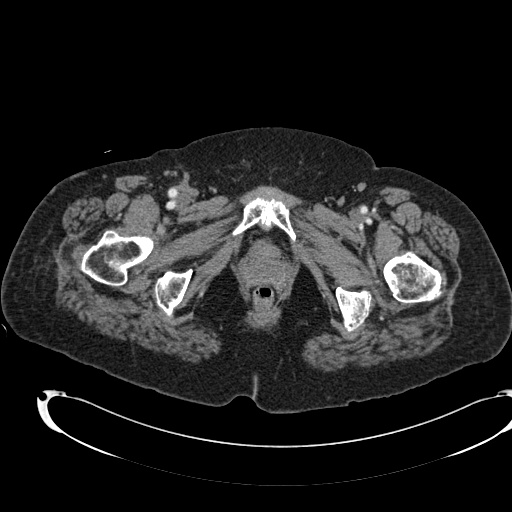
[im 24/225  bone]
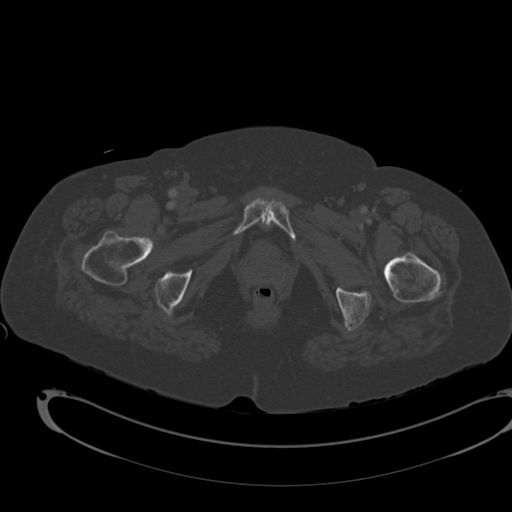
[im 48/225  soft-tissue]
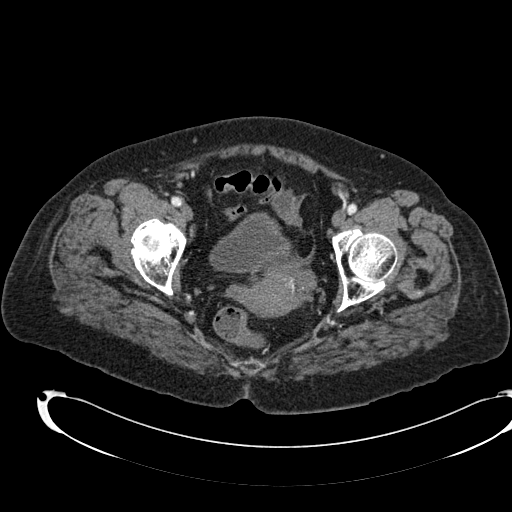
[im 71/225  soft-tissue]
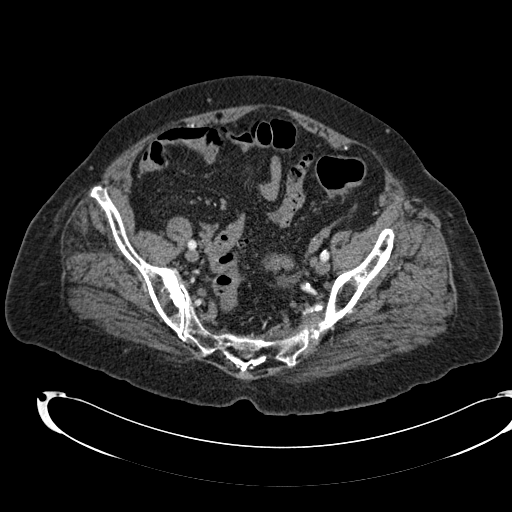
[im 95/225  soft-tissue]
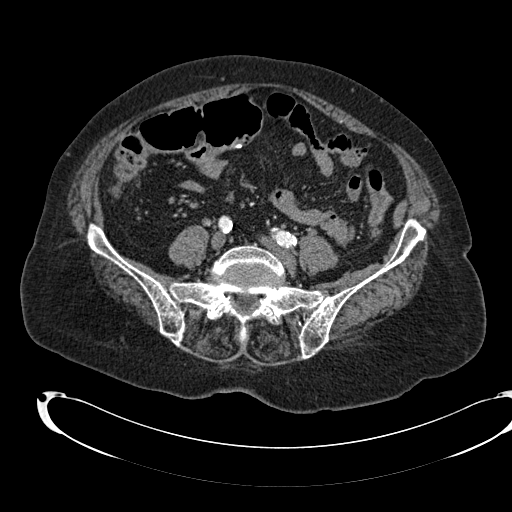
[im 118/225  soft-tissue]
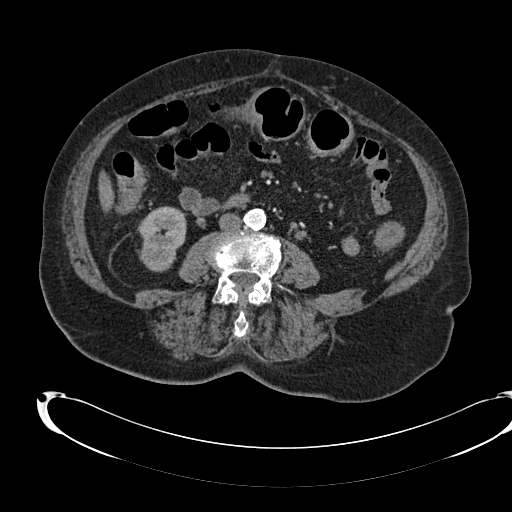
[im 142/225  soft-tissue]
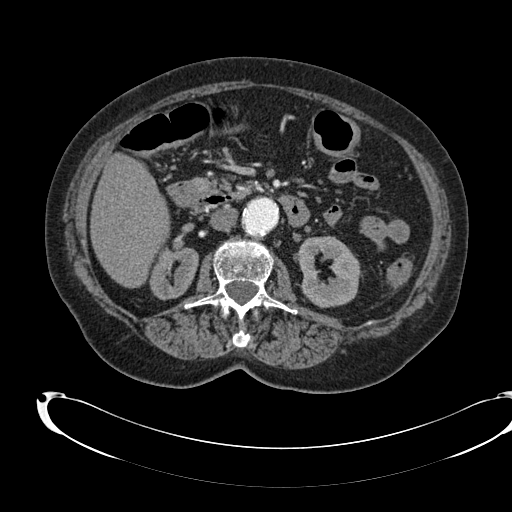
[im 166/225  soft-tissue]
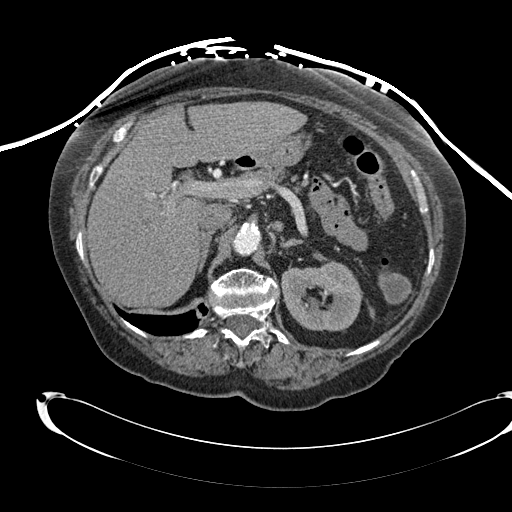
[im 177/225  lung]
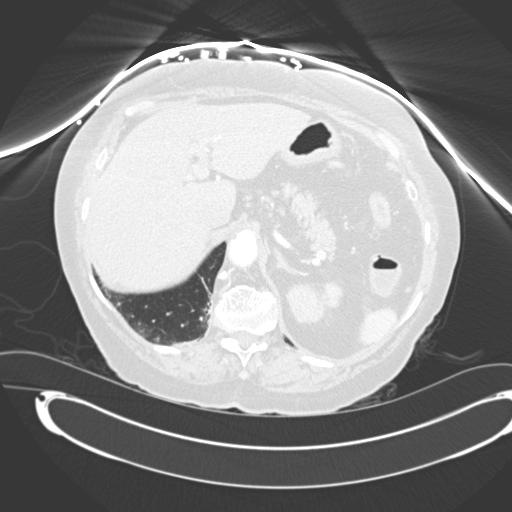
[im 189/225  soft-tissue]
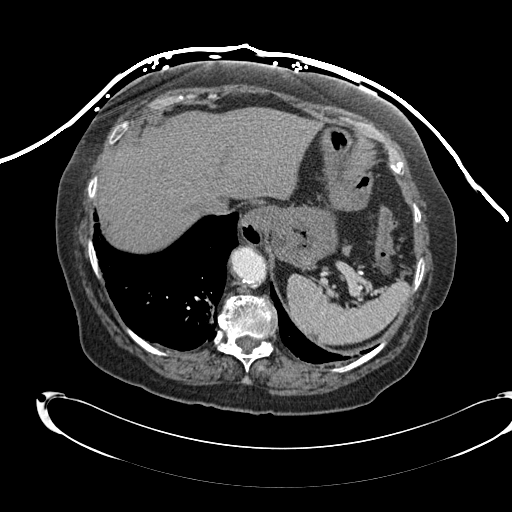
[im 189/225  lung]
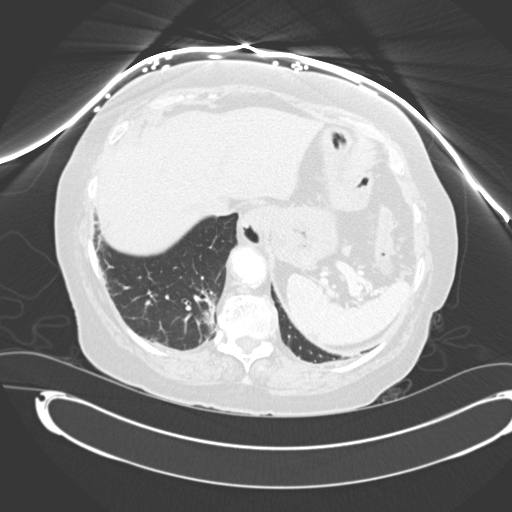
[im 201/225  lung]
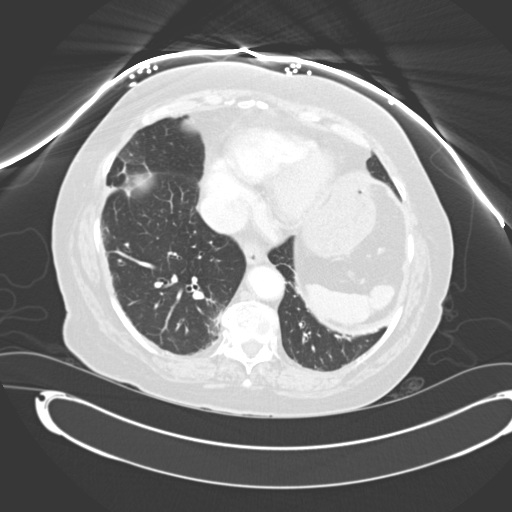
[im 213/225  soft-tissue]
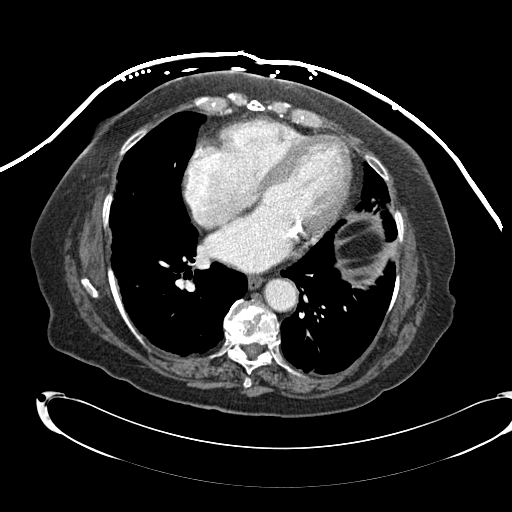
[im 213/225  lung]
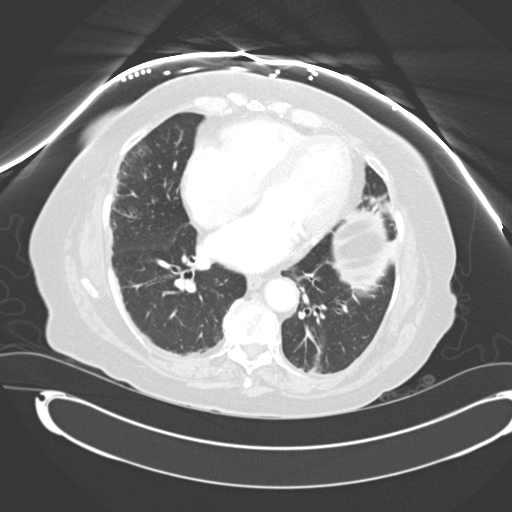
[im 213/225  bone]
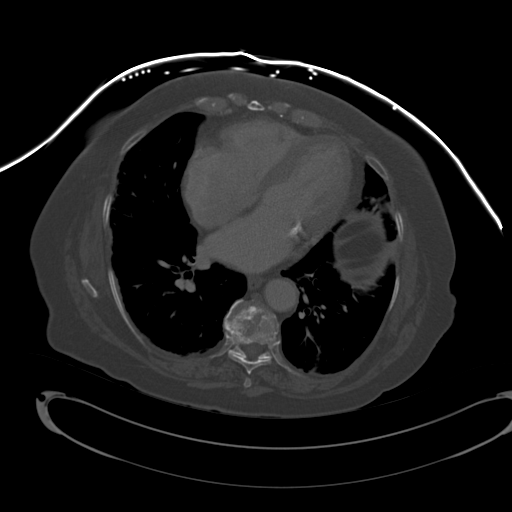

[Series 11: abd cta 2.0 spo · coronal · 0.88mm/px · 3 of 146 slices shown]
[im 37/146  soft-tissue]
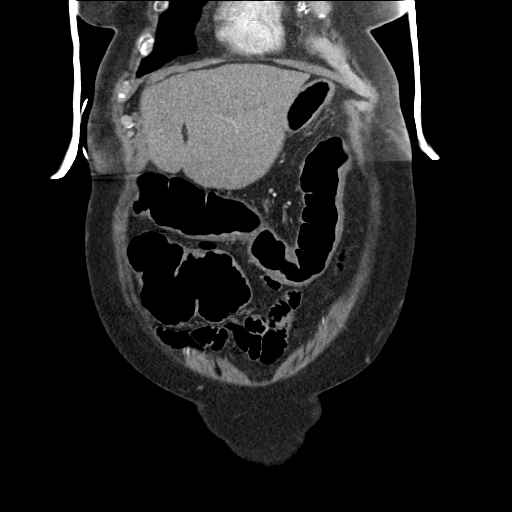
[im 73/146  soft-tissue]
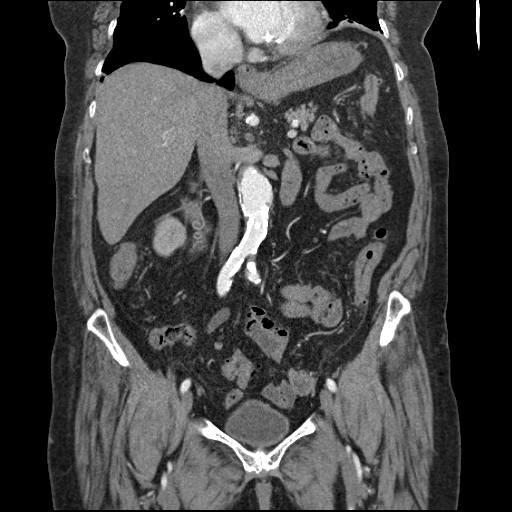
[im 109/146  soft-tissue]
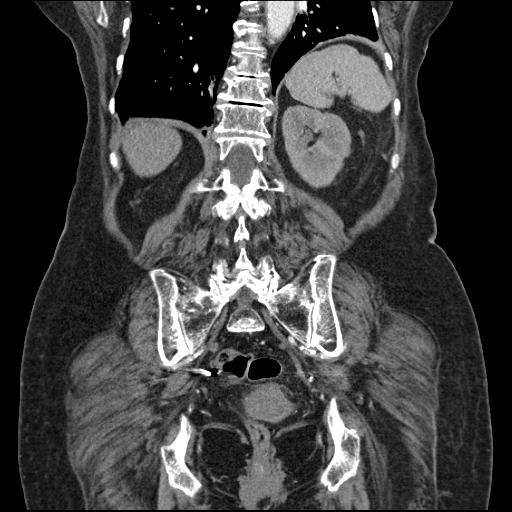

[14 of 46 positions shown; findings below may reference images not displayed]

FINDINGS: Mild volume loss at the lung bases bilaterally.  Mild
cardiomegaly without pericardial or pleural effusion.  A small
hiatal hernia.

Normal liver, spleen.  Underdistended proximal stomach.  Normal
pancreas, gallbladder, biliary tract, adrenal glands.  Mild right
renal cortical atrophy.  Normal left kidney for age.

Prior celiac axis stenting.  Persistent atherosclerosis just
proximal to the stent.  Limited evaluation for intrastent stenosis.
Distal celiac axis and branch vessels are well opacified.

Persistent occlusion of the proximal superior mesenteric artery.
This continues for approximately 1.5 cm span on sagittal image 89.
Similar to on the prior exam.  Again reconstituted via
peripancreatic collaterals.

Dense atherosclerosis of the origin of the bilateral renal
arteries.  Suspect a significant right renal artery stenosis.

Infrarenal abdominal aortic aneurysm again identified.  Maximally
3.0 cm.  No surrounding hemorrhage.  The inferior mesenteric artery
remains patent.

Mildly prominent retroperitoneal lymph nodes are unchanged and
likely reactive.

Multifocal mild to moderate colonic wall thickening.  Example
descending colon on image 108, transverse colon distally on image
92, and hepatic flexure on image 103.  No pneumatosis or free
intraperitoneal air.  Small bowel normal in caliber.  No abdominal
ascites.

 Review of the MIP images confirms the above findings.
IMPRESSION: 1.  Multifocal colonic wall thickening.  Given the appearance of
the aorta and branch vessels, this is suspicious for ischemia.
2.  Redemonstration of celiac axis stent with good opacification of
the delayed distal celiac axis and branch vessels.
3.  Similar occlusion of the proximal superior mesenteric artery
with reconstitution via collaterals.
4.  Similar infrarenal abdominal aortic aneurysm.

CTA PELVIS
FINDINGS: Sigmoid diverticulosis.  No pelvic colonic wall
thickening.  Normal pelvic small bowel.

The abdominal aorta aneurysm terminates prior to the bifurcation.
Atherosclerosis without significant stenosis within the pelvic
vasculature.  Internal iliac arteries are both patent.

No pelvic adenopathy.  Normal urinary bladder and uterus.  No
adnexal mass.  Trace pelvic fluid is nonspecific.  Mild osteopenia.
Nonacute right inferior pubic ramus fracture.  This is new since
[DATE].

 Review of the MIP images confirms the above findings.
IMPRESSION: 1.  No acute pelvic process.
2.  Trace free pelvic fluid is nonspecific but could relate to the
abdominal process.
3.  Nonacute right inferior pubic ramus fracture, new since
[DATE].

## 2010-03-08 IMAGING — CR DG CHEST 2V
2 series · 2 of 2 positions shown · non-contrast
Comparison: [DATE]

CLINICAL DATA: GI bleed

CHEST - 2 VIEW

[w chest pa]
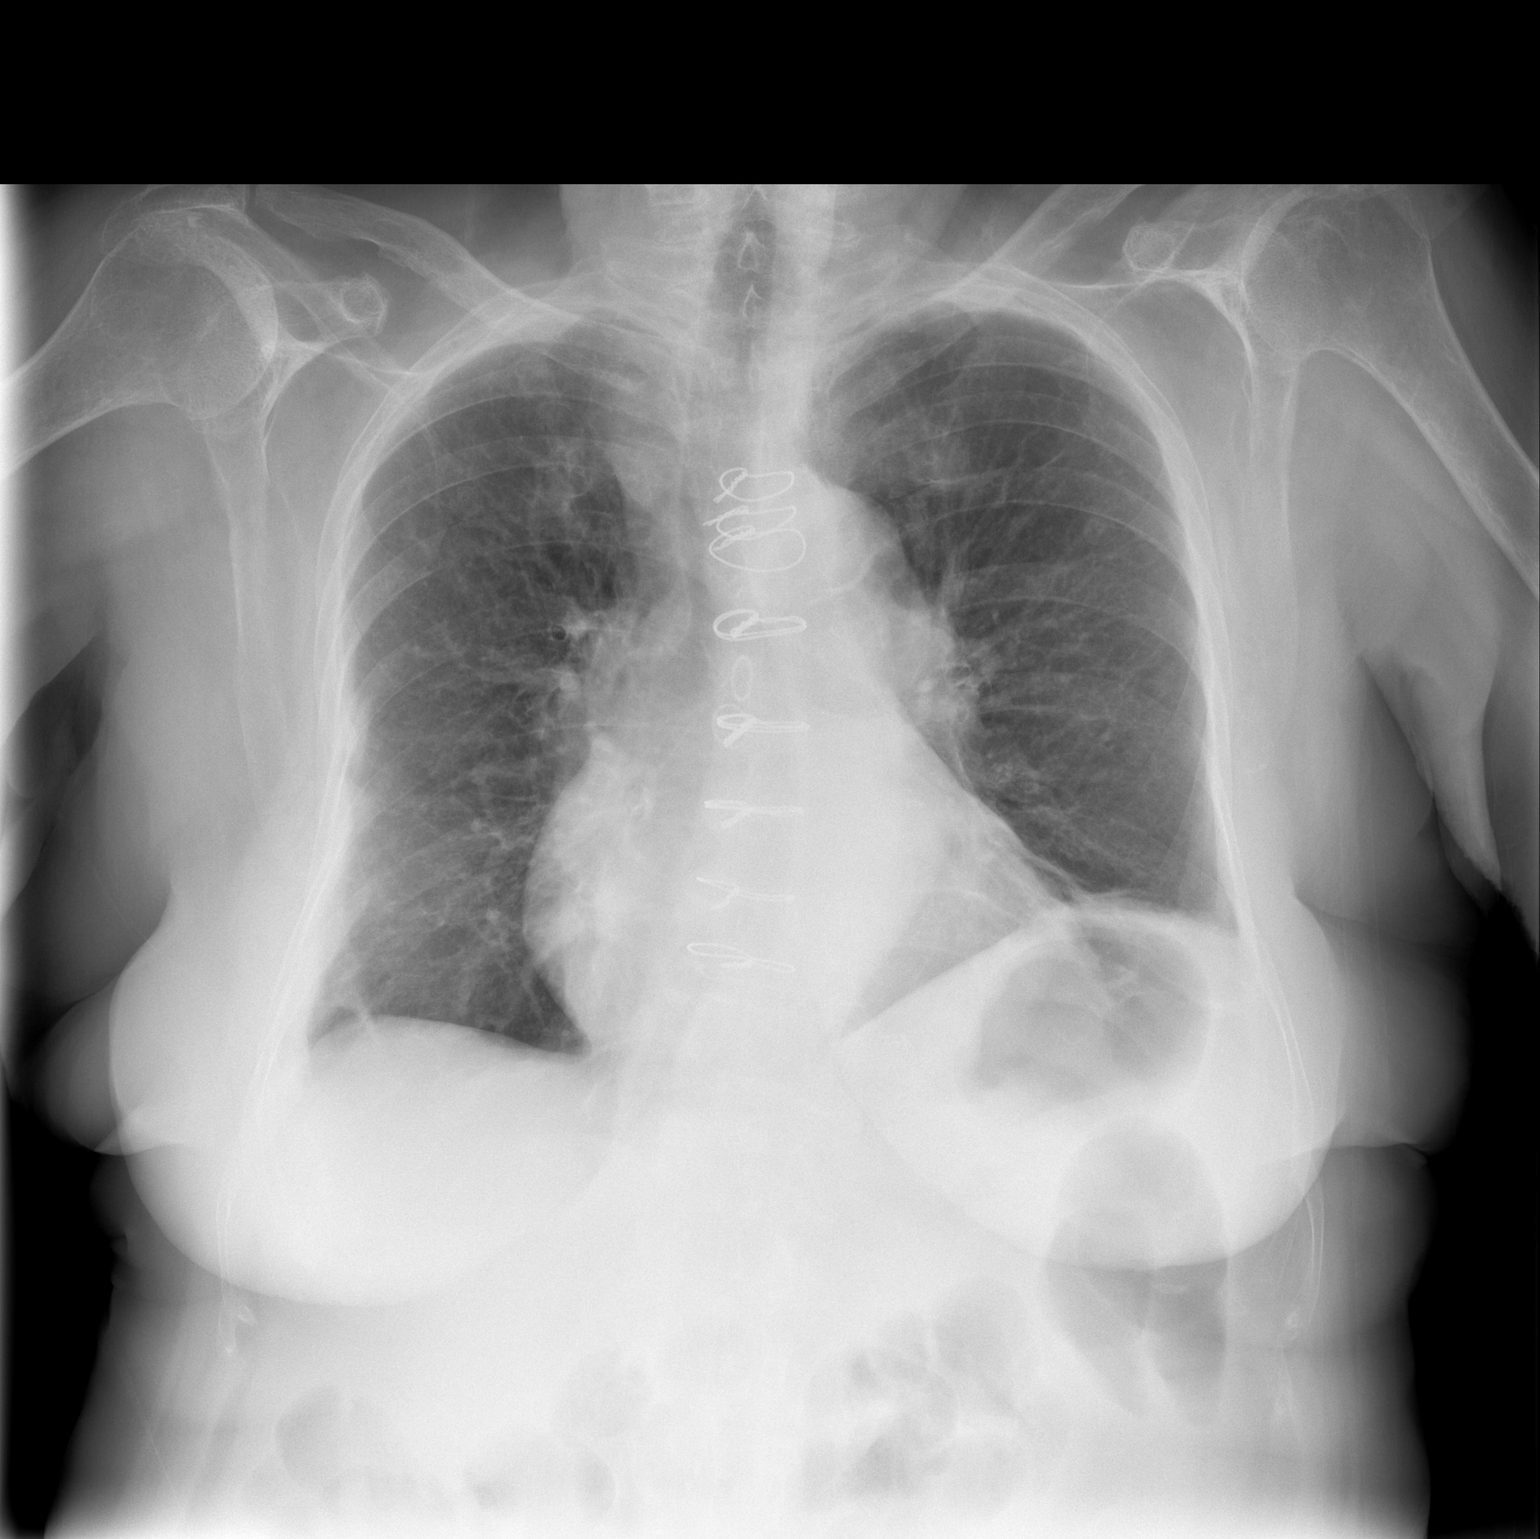

[w chest lat]
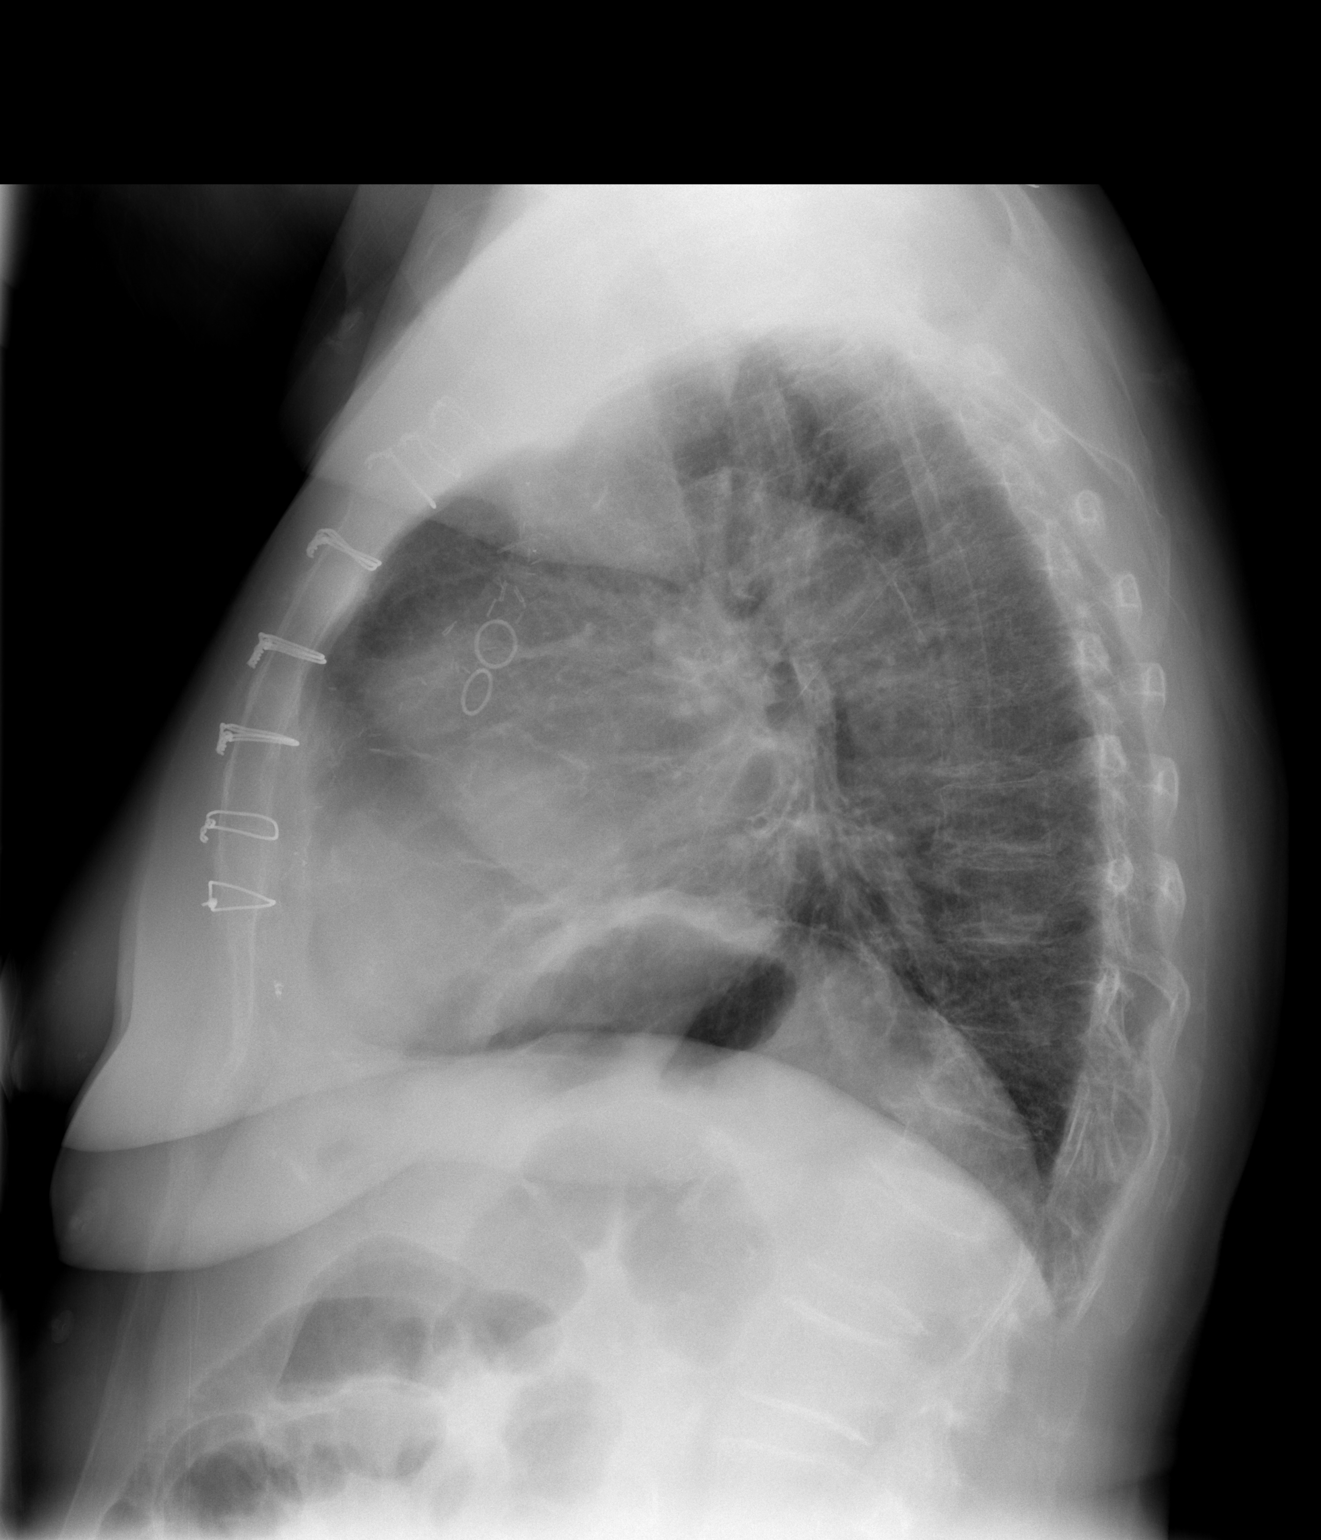

[2 of 2 positions shown; findings below may reference images not displayed]

FINDINGS: Post CABG.  Heart size currently normal.  Chronic changes
but no congestive heart failure or active disease.  There are old
right rib fractures.  The bones are demineralized.
IMPRESSION: Chronic and postoperative changes - no active disease.

## 2010-03-09 ENCOUNTER — Encounter (INDEPENDENT_AMBULATORY_CARE_PROVIDER_SITE_OTHER): Payer: Self-pay | Admitting: *Deleted

## 2010-03-10 ENCOUNTER — Encounter (INDEPENDENT_AMBULATORY_CARE_PROVIDER_SITE_OTHER): Payer: Self-pay | Admitting: *Deleted

## 2010-03-10 IMAGING — CR DG CHEST 1V PORT
1 series · 1 of 1 positions shown · non-contrast
Comparison: [DATE]

CLINICAL DATA: GI bleed/PICC placement

PORTABLE CHEST - 1 VIEW

[AP]
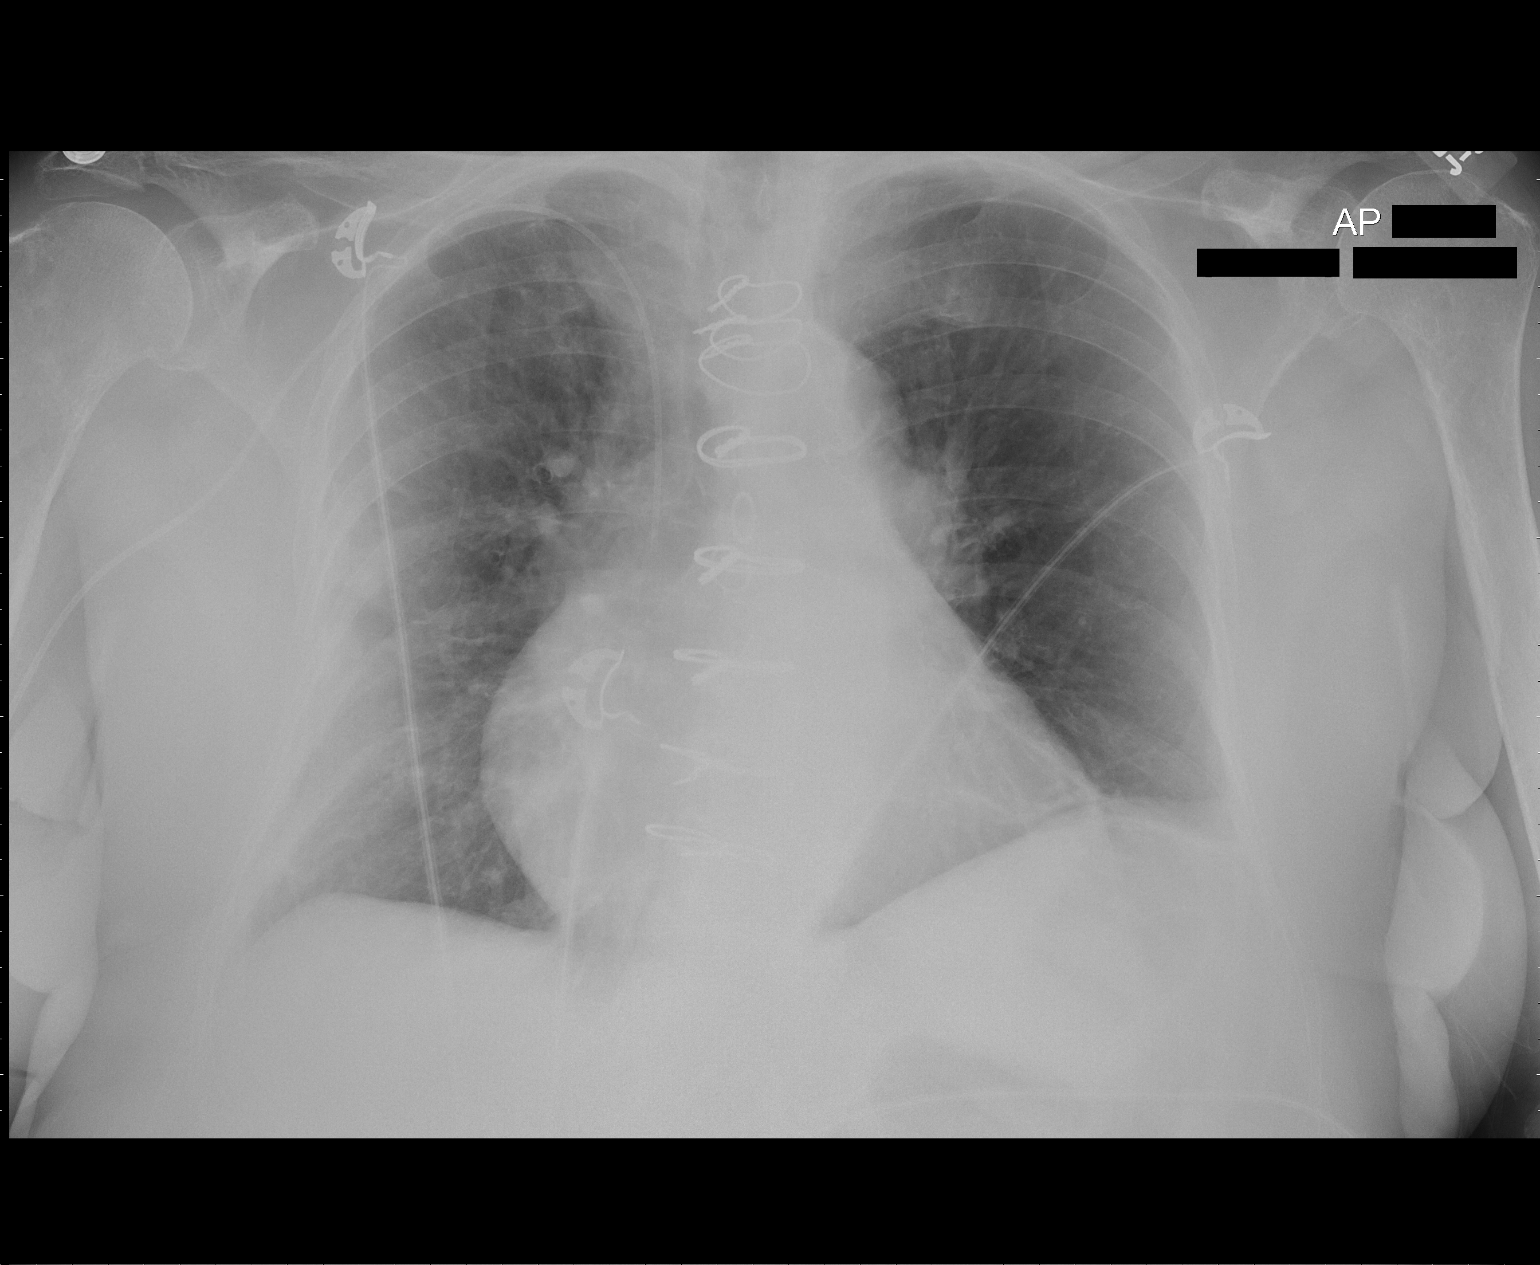

[1 of 1 positions shown; findings below may reference images not displayed]

FINDINGS: A right arm PICC has been placed with its tip in the mid
to low SVC.  Post CABG.  Scarring at the left lung base.  No active
process or interval change.
IMPRESSION: Right arm PICC placement to the mid to low SVC.

## 2010-03-11 ENCOUNTER — Encounter (INDEPENDENT_AMBULATORY_CARE_PROVIDER_SITE_OTHER): Payer: Self-pay | Admitting: *Deleted

## 2010-03-12 ENCOUNTER — Encounter (INDEPENDENT_AMBULATORY_CARE_PROVIDER_SITE_OTHER): Payer: Self-pay | Admitting: *Deleted

## 2010-03-25 ENCOUNTER — Encounter: Payer: Self-pay | Admitting: Internal Medicine

## 2010-03-25 ENCOUNTER — Encounter (INDEPENDENT_AMBULATORY_CARE_PROVIDER_SITE_OTHER): Payer: Self-pay | Admitting: *Deleted

## 2010-03-25 ENCOUNTER — Ambulatory Visit: Payer: Self-pay | Admitting: Vascular Surgery

## 2010-04-09 ENCOUNTER — Encounter: Payer: Self-pay | Admitting: Internal Medicine

## 2010-04-27 ENCOUNTER — Ambulatory Visit
Admission: RE | Admit: 2010-04-27 | Discharge: 2010-04-27 | Payer: Self-pay | Source: Home / Self Care | Attending: Internal Medicine | Admitting: Internal Medicine

## 2010-04-27 DIAGNOSIS — K551 Chronic vascular disorders of intestine: Secondary | ICD-10-CM | POA: Insufficient documentation

## 2010-04-27 DIAGNOSIS — K253 Acute gastric ulcer without hemorrhage or perforation: Secondary | ICD-10-CM | POA: Insufficient documentation

## 2010-05-02 ENCOUNTER — Encounter: Payer: Self-pay | Admitting: Cardiothoracic Surgery

## 2010-05-11 NOTE — Assessment & Plan Note (Signed)
Summary: NURSE VISIT  Nurse Visit   Vital Signs:  Patient profile:   77 year old female Weight:      170 pounds O2 Sat:      95 % on Room air Pulse rate:   105 / minute Pulse (ortho):   105 / minute BP sitting:   131 / 81  (left arm) BP standing:   136 / 80  Vitals Entered ByLarita Fife Via LPN (October 29, 2009 4:33 PM)  O2 Flow:  Room air  Serial Vital Signs/Assessments:  Time      Position  BP       Pulse  Resp  Temp     By 4:35pm    Lying LA  115/76   106                   Lynn Via LPN 1:30QM    Sitting   145/68   106                   Lynn Via LPN 5:78IO    Standing  136/80   105                   Lynn Via LPN  Comments: 9:62XB no s/s By: Larita Fife Via LPN    Primary Provider:  Dr.Shah   History of Present Illness: S: Pt. arrives in office for BP check/nurse visit.  B: On last OV with Dr. Dietrich Pates on 09-24-09 pt. was started back on Lisinopril 20mg  once daily. A: Pt. c/o dizziness since going back on Lisinopril, she states she has fallen X2 in the past 2 weeks. BP diary: AVG. SBP=137, AVG. DBP=74 and AVG. Pulse=92. R: Advised pt. to be careful while mobile and we will call her with Dr. Langston Masker recommendation, if any.   10/30/09  Blood pressure control appears to be good without orthostasis to account for dizziness, falls or syncope.  Schedule followup visit, first available, with Ms. Lawrence or Dr. Dietrich Pates.  [place] Bing, M.D.   Appt. scheduled for 11-24-09 at 1:00pm with Joni Reining, NP. Pt. aware.   Current Medications (verified): 1)  Torsemide 100 Mg Tabs (Torsemide) .... Take 1/2  Tablet By Mouth Daily. 2)  Prilosec 20 Mg Cpdr (Omeprazole) .... One Tab By Mouth Daily 3)  Synthroid 50 Mcg Tabs (Levothyroxine Sodium) .Marland Kitchen.. 1 Tab By Mouth Daily 4)  Aspirin 81 Mg Tabs (Aspirin) .... One Tab By Mouth Daily 5)  Coumadin .... As Directed 6)  Colace 100 Mg Caps (Docusate Sodium) .... Take 1 Tablet By Mouth Two Times A Day 7)  Pravastatin Sodium 40 Mg Tabs  (Pravastatin Sodium) .... Take One Tablet By Mouth Daily At Bedtime 8)  Klonopin 0.5 Mg Tabs (Clonazepam) .... Take 1 Tab By Mouth At Bedtime 9)  Diltiazem Hcl Er Beads 360 Mg Xr24h-Cap (Diltiazem Hcl Er Beads) .... Take One Capsule By Mouth Daily 10)  Tramadol Hcl 50 Mg Tabs (Tramadol Hcl) .... Take 1 Tab Am 11)  Lisinopril 20 Mg Tabs (Lisinopril) .... Take One Tablet By Mouth Daily  Allergies (verified): No Known Drug Allergies

## 2010-05-11 NOTE — Miscellaneous (Signed)
Summary: Home Health Certification/Care Plan  Home Health Certification/Care Plan   Imported By: Roderic Ovens 06/25/2009 11:28:05  _____________________________________________________________________  External Attachment:    Type:   Image     Comment:   External Document

## 2010-05-11 NOTE — Miscellaneous (Signed)
Summary: [facility] labs 09/24/2009  Clinical Lists Changes  Observations: Added new observation of CALCIUM: 8.9 mg/dL (16/01/9603 5:40) Added new observation of PROTEIN, TOT: 7.6 g/dL (98/02/9146 8:29) Added new observation of SGPT (ALT): 14 units/L (09/24/2009 8:31) Added new observation of SGOT (AST): 21 units/L (09/24/2009 8:31) Added new observation of ALK PHOS: 99 units/L (09/24/2009 8:31) Added new observation of GFR AA: >60 mL/min/1.19m2 (09/24/2009 8:31) Added new observation of GFR: 57 mL/min (09/24/2009 8:31) Added new observation of CREATININE: 0.95 mg/dL (56/21/3086 5:78) Added new observation of BUN: 13 mg/dL (46/96/2952 8:41) Added new observation of BG RANDOM: 117 mg/dL (32/44/0102 7:25) Added new observation of CO2 PLSM/SER: 27.0 meq/L (09/24/2009 8:31) Added new observation of CL SERUM: 107 meq/L (09/24/2009 8:31) Added new observation of K SERUM: 3.7 meq/L (09/24/2009 8:31) Added new observation of NA: 141 meq/L (09/24/2009 8:31) Added new observation of LDL: 94 mg/dL (36/64/4034 7:42) Added new observation of HDL: 37 mg/dL (59/56/3875 6:43) Added new observation of TRIGLYC TOT: 109 mg/dL (32/95/1884 1:66) Added new observation of CHOLESTEROL: 153 mg/dL (10/09/1599 0:93) Added new observation of TSH: 10.40 microintl units/mL (09/24/2009 8:31)

## 2010-05-11 NOTE — Letter (Signed)
Summary: EKG 07-08-09  EKG 07-08-09   Imported By: Faythe Ghee 10/01/2009 10:13:00  _____________________________________________________________________  External Attachment:    Type:   Image     Comment:   External Document

## 2010-05-11 NOTE — Progress Notes (Signed)
Summary: Medication Change Suggested by PMD  Phone Note Other Incoming   Caller: Physician-Dr. Sherryll Burger Details for Reason: Discuss cardiology issue Summary of Call: Evaluation by PMD revealed increasing fatigue.  Atrial fibrillation present on EKG in office with adequate control of heart rate.  Dr. Sherryll Burger would like to discontinue metoprolol and Norvasc, substituting diltiazem.  I agreed that that was a reasonable approach and will reassess this nice woman at her next office visit.  [place] Bing, M.D.  Initial call taken by: Kathlen Brunswick, MD, [facility],  July 08, 2009 9:28 PM

## 2010-05-11 NOTE — Progress Notes (Signed)
Summary: TEST RESULTS  Phone Note Call from Patient Call back at [phone]   Caller: PT DAUGHTER [name] LAW DAUGHTER Reason for Call: Lab or Test Results Summary of Call: WOULD LIKE TO KNOW ULTRA SOUND RESULTS Initial call taken by: Faythe Ghee,  June 02, 2009 11:54 AM  Follow-up for Phone Call        Called pt and her daughter and gave results over the phone, both verbalized understanding Follow-up by: Teressa Lower RN,  June 02, 2009 12:04 PM

## 2010-05-11 NOTE — Miscellaneous (Signed)
Summary: Home Health Certification/Care Plan  Home Health Certification/Care Plan   Imported By: Roderic Ovens 06/22/2009 14:28:06  _____________________________________________________________________  External Attachment:    Type:   Image     Comment:   External Document

## 2010-05-11 NOTE — Miscellaneous (Signed)
Summary: bmp, bnp  Clinical Lists Changes  Observations: Added new observation of GFR AA: 50 mL/min/1.26m2 (10/15/2009 16:55) Added new observation of GFR: 41 mL/min (10/15/2009 16:55) Added new observation of CREATININE: 1.26 mg/dL (16/01/9603 54:09) Added new observation of BUN: 23 mg/dL (81/19/1478 29:56) Added new observation of BG RANDOM: 167 mg/dL (21/30/8657 84:69) Added new observation of CO2 PLSM/SER: 24 meq/L (10/15/2009 16:55) Added new observation of CL SERUM: 100 meq/L (10/15/2009 16:55) Added new observation of K SERUM: 3.6 meq/L (10/15/2009 16:55) Added new observation of NA: 136 meq/L (10/15/2009 16:55) Added new observation of BNP: 129  (10/15/2009 16:55)

## 2010-05-11 NOTE — Letter (Signed)
Summary: PROGRESS NOTE DR [facility]  PROGRESS NOTE DR [facility]   Imported By: Faythe Ghee 10/01/2009 10:13:37  _____________________________________________________________________  External Attachment:    Type:   Image     Comment:   External Document

## 2010-05-11 NOTE — Letter (Signed)
Summary: [facility] Office Visit   [facility] Office Visit   Imported By: Roderic Ovens 01/25/2010 17:18:50  _____________________________________________________________________  External Attachment:    Type:   Image     Comment:   External Document

## 2010-05-11 NOTE — Assessment & Plan Note (Signed)
Summary: [facility]   Primary Provider:  Dr Linna Darner   History of Present Illness: [name] returns today for followup.  She was hospitalized several weeks ago with abdomenal pain and was found to be bradycardic.  At that time we stopped or cut down on several of her sinus nodal blocking drugs and she returns for followup.  She has not had any symptomatic bradycardia.  She does note generalized fatigue which is worse in the morning and arthritic complaints, also worse in the morning.  She had previously been on Methotrexate which was stopped after her bypass surgery.  Current Medications (verified): 1)  Torsemide 20 Mg Tabs (Torsemide) .... Take 1 Tablet By Mouth Once A Day 2)  Prilosec 20 Mg Cpdr (Omeprazole) .... One Tab By Mouth Daily 3)  Synthroid 50 Mcg Tabs (Levothyroxine Sodium) .... One Tab By Mouth Daily 4)  Aspirin 81 Mg Tabs (Aspirin) .... One Tab By Mouth Daily 5)  Coumadin .... As Directed 6)  Metoprolol Tartrate 25 Mg Tabs (Metoprolol Tartrate) .... Take One Half Tablet By Mouth Everyday 7)  Amiodarone Hcl 200 Mg Tabs (Amiodarone Hcl) .... Take One Tablet By Mouth Daily 8)  Colace 100 Mg Caps (Docusate Sodium) .... Take 1 Tablet By Mouth Two Times A Day 9)  Glucophage 500 Mg Tabs (Metformin Hcl) .... Take 1 Tablet By Mouth Once A Day 10)  Klor-Con 20 Meq Pack (Potassium Chloride) .... Take 1 Tablet By Mouth Once A Day 11)  Lisinopril 20 Mg Tabs (Lisinopril) .... Take One Tablet By Mouth Daily 12)  Pravastatin Sodium 40 Mg Tabs (Pravastatin Sodium) .... Take One Tablet By Mouth Daily At Bedtime 13)  Plavix 75 Mg Tabs (Clopidogrel Bisulfate) .... Take One Tablet By Mouth Daily 14)  Klonopin 0.5 Mg Tabs (Clonazepam) .... Take 1 Tab By Mouth At Bedtime  Allergies (verified): No Known Drug Allergies  Past History:  Past Medical History: Last updated: 05/19/2008 coronary artery diseasestatus post coronary bypass grafting x3left internal mammary artery to the LAD,  saphenous saphenous vein graft to OM1 and OM 3, saphenous vein graft to the obtuse marginal 2 Status post right superficial femoral artery and anterior tibial embolectomy, endarterectomy of the tibia peroneal trunk and patch angioplasty of the popliteal and tibioperoneal trunk on February 05, 2008 for ischemic right foot 95% right renal artery stenosis 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    Vital Signs:  Patient profile:   77 year old female Height:      62 inches Weight:      132 pounds BMI:     24.23 Pulse rate:   42 / minute BP sitting:   161 / 56  (right arm)  Vitals Entered By: Teressa Lower RN (October 15, 2008 1:58 PM)  Physical Exam  General:  Well developed, well nourished, in no acute distress. Head:  normocephalic and atraumatic Eyes:  PERRLA/EOM intact; conjunctiva and lids normal. Mouth:  Teeth, gums and palate normal. Oral mucosa normal. Neck:  Neck supple, no JVD. No masses, thyromegaly or abnormal cervical nodes. Lungs:  Clear bilaterally to auscultation and percussion. Heart:  Regular bradycardia with normal S1 and S2.  No murmurs. Abdomen:  Bowel sounds positive; abdomen soft and non-tender without masses, organomegaly, or hernias noted. No hepatosplenomegaly. Msk:  Back normal, normal gait. Muscle strength and tone normal. Pulses:  pulses normal in all 4 extremities Extremities:  No clubbing or cyanosis. Neurologic:  Alert and oriented x 3.   Impression & Recommendations:  Problem # 1:  ATRIAL FIBRILLATION (ICD-427.31) She is maintaining NSR  with out symptomatic atrial fibrillation.  Continue low dose amiodarone.  Problem # 2:  CHRONIC DIASTOLIC HEART FAILURE (ICD-428.32) She is class 2 today.  Continue meds below.  She admits to sodium indiscretion. The following medications were removed from the medication list:    Lopressor Hct 100-25 Mg Tabs (Metoprolol-hydrochlorothiazide) ..... One tab by mouth twice a day    Norvasc 10 Mg Tabs (Amlodipine besylate) ..... One tab by mouth daily    Spironolactone 25 Mg Tabs (Spironolactone) .Marland Kitchen... Take 1 tablet by mouth once a day    Metoprolol Tartrate 25 Mg Tabs (Metoprolol tartrate) ..... One tab three x day Her updated medication list for this problem includes:    Torsemide 20 Mg Tabs (Torsemide) .Marland Kitchen... Take 1 tablet by mouth once a day    Aspirin 81 Mg Tabs (Aspirin) ..... One tab by mouth daily    Metoprolol Tartrate 25 Mg Tabs (Metoprolol tartrate) .Marland Kitchen... Take one half tablet by mouth everyday    Amiodarone Hcl 200 Mg Tabs (Amiodarone hcl) .Marland Kitchen... Take one tablet by mouth daily    Lisinopril 20 Mg Tabs (Lisinopril) .Marland Kitchen... Take one tablet by mouth daily    Plavix 75 Mg Tabs (Clopidogrel bisulfate) .Marland Kitchen... Take one tablet by mouth daily  Problem # 3:  SINUS BRADYCARDIA (ICD-427.81) She appears to be asymptomatic.  Will hold off on PPM at this time.

## 2010-05-11 NOTE — Progress Notes (Signed)
**Note De-Identified Jac Romulus Obfuscation** Summary: Concerns  Phone Note Call from Patient Call back at [phone]   Caller: [name] (daughter) Reason for Call: Talk to Nurse Summary of Call: States pt had some chest tightness last night/BP lower than normal today 133/50/pls call daughter to advise what to do/tg Initial call taken by: Raechel Ache [facility],  May 15, 2009 2:28 PM  Follow-up for Phone Call        Daughter, [name], advised that pt. should go to ED if s/s are persistant and or worsen. Daughter wants to know if there is something, other than going to ED, that she can do. She states that her mother has gone from 158lbs. to 164lbs. in 5 to 6 days. Please advise. Follow-up by: Larita Fife Lota Leamer LPN,  May 15, 2009 3:39 PM  Additional Follow-up for Phone Call Additional follow up Details #1::        I spoke with pts daughter and gave her the on call number for the weekend and suggested [facility]  ED if her mothers sob and increased weight did not improve/ T. Allyne Gee, RN 05/15/2009  1700    Additional Follow-up for Phone Call Additional follow up Details #2::    Patient being seen by Joni Reining today. Follow-up by: Larita Fife Legrand Lasser LPN,  May 19, 2009 4:43 PM

## 2010-05-11 NOTE — Progress Notes (Signed)
Summary: Elevated HR  Phone Note Call from Patient Call back at [phone]   Caller: Daughter Maureen Ralphs) Reason for Call: Talk to Nurse Summary of Call: pt's daughter states that pt.'s HR hasn't come down and she is in A-Fib even after starting Amlodopine/wants to know if the medicine should be increased or if this is ok/states that her HR has been around 103 and it is still running around 93/tg Initial call taken by: Raechel Ache [facility],  July 06, 2009 11:15 AM  Follow-up for Phone Call        pt's hr still in mid 90's, weak and tired,explained these are the signs of AF. please advise range for hr and if we want to increase toprol xl it is at 50mg  daily at this time. also call pt [phone] Follow-up by: Teressa Lower RN,  July 06, 2009 11:58 AM  Additional Follow-up for Phone Call Additional follow up Details #1::        HR around 100 is fine. Amlodipine has no effect on AFand is not one of the meds she is taking for it.  [place] Bing, M.D.  Additional Follow-up by: Kathlen Brunswick, MD, [facility],  July 08, 2009 9:09 AM    Additional Follow-up for Phone Call Additional follow up Details #2::    I spoke with pt and daughter, daughter is still concerned about hr. Follow-up by: Teressa Lower RN,  July 08, 2009 9:47 AM

## 2010-05-11 NOTE — Letter (Signed)
Summary: LABS 11-24-09  LABS 11-24-09   Imported By: Faythe Ghee 11/25/2009 09:59:14  _____________________________________________________________________  External Attachment:    Type:   Image     Comment:   External Document  Appended Document: LABS 11-24-09 Please call her to let her know that her urinalysis was negative for UTI Thanks!  Appended Document: LABS 11-24-09 Pt. advised.

## 2010-05-11 NOTE — Assessment & Plan Note (Signed)
Summary: 3 mth f/u per checkout on 06/26/09/tg   Visit Type:  Follow-up Primary Provider:  Dr.Shah   History of Present Illness: Ms. [name] returns to the office as scheduled for continued assessment and treatment of a history of congestive heart failure with preserved left ventricular systolic function.  She has done well since her last visit 3 months ago.  Weight has been stable.  She accomplishes her usual daily activities without any cardiopulmonary symptoms.  She has had only modest edema.  She denies orthopnea or PND.  There is been no lightheadedness and no syncope.  She notes no palpitations or other clinical symptoms to suggest recurrent atrial fibrillation since amiodarone was discontinued.  Diltiazem was substituted for amlodipine plus metoprolol by Dr. Sherryll Burger as the result of fatigue.  Current Medications (verified): 1)  Torsemide 100 Mg Tabs (Torsemide) .... Take 1/2  Tablet By Mouth Daily. 2)  Prilosec 20 Mg Cpdr (Omeprazole) .... One Tab By Mouth Daily 3)  Synthroid 50 Mcg Tabs (Levothyroxine Sodium) .Marland Kitchen.. 1 Tab By Mouth Daily 4)  Aspirin 81 Mg Tabs (Aspirin) .... One Tab By Mouth Daily 5)  Coumadin .... As Directed 6)  Colace 100 Mg Caps (Docusate Sodium) .... Take 1 Tablet By Mouth Two Times A Day 7)  Pravastatin Sodium 40 Mg Tabs (Pravastatin Sodium) .... Take One Tablet By Mouth Daily At Bedtime 8)  Klonopin 0.5 Mg Tabs (Clonazepam) .... Take 1 Tab By Mouth At Bedtime 9)  Diltiazem Hcl Er Beads 360 Mg Xr24h-Cap (Diltiazem Hcl Er Beads) .... Take One Capsule By Mouth Daily 10)  Tramadol Hcl 50 Mg Tabs (Tramadol Hcl) .... Take 1 Tab Am 11)  Lisinopril 20 Mg Tabs (Lisinopril) .... Take One Tablet By Mouth Daily  Allergies (verified): No Known Drug Allergies  Past History:  PMH, FH, and Social History reviewed and updated.  Review of Systems       See history of present illness.  Vital Signs:  Patient profile:   77 year old female Weight:      168 pounds BMI:      30.84 Pulse rate:   70 / minute BP sitting:   164 / 74  (right arm)  Vitals Entered By: Dreama Saa, CNA (September 24, 2009 1:34 PM)  Physical Exam  General:  Somewhat overweight; well developed; no acute distress:   Neck-No JVD; bilateral carotid bruits; ectatic left carotid Lungs-No tachypnea; no rhonchi; no wheezes; coarse bibasilar breath sounds Cardiovascular-normal PMI; normal S1; prominently split S2; grade 2/6 systolic ejection murmur at the left sternal border Abdomen-BS normal; soft and non-tender without masses or organomegaly:  Musculoskeletal-No clubbing or cyanosis; olecranon bursae are fluid filled bilaterally; rheumatoid deformity of fingers Neurologic-Normal cranial nerves; symmetric strength and tone:  Skin-Warm,marked bruising of the arms with a skin tear over the left forearm Extremities-Nl distal pulses; 1+ edema:     Impression & Recommendations:  Problem # 1:  CHF (ICD-428.0) Weight is only increased 2 pounds since her last visit.  Due to abnormal ausculatory findings on lung exam, a chest x-ray will be obtained.  Electrolytes and renal function will be monitored.  Current medications will be continued.  Problem # 2:  ATHEROSCLEROTIC CARDIOVASCULAR DISEASE-CABG (ICD-429.2) Control of hypertension and hyperlipidemia will be monitored.  Problem # 3:  ATRIAL FIBRILLATION (ICD-427.31) A 6 minute walk was performed.  The patient was able to cover 800 feet without dyspnea or chest pain.  A rhythm strip showed atrial fibrillation with a ventricular rate of 75  and an occasional PVC at rest.  Following exercise, there was a minimal increase in heart rate.  Patient has no symptoms attributable to her arrhythmia, and heart rate control is excellent.  Problem # 4:  COUMADIN THERAPY (ICD-V58.61) Managed by patient's PMD.  Recurrent atrial fibrillation has been documented since amiodarone was discontinued; lifelong anticoagulation is anticipated.  Other Orders: T-Chest  x-ray, 2 views ([id number]) Future Orders: T-BNP  (B Natriuretic Peptide) ([id number]) ... 10/15/2009 T-Basic Metabolic Panel [phone]) ... 10/15/2009 T-Basic Metabolic Panel [id number]) ... 11/05/2009  Patient Instructions: 1)  Your physician recommends that you schedule a follow-up appointment in: 5 months 2)  Your physician recommends that you return for lab work in: 3 & 6 weeks 3)  Your physician has recommended you make the following change in your medication: lisinopril 20mg  daily, change diltiazem to 360mg  daily, change torsemide 50mg  daily 4)  A chest x-ray takes a picture of the organs and structures inside the chest, including the heart, lungs, and blood vessels. This test can show several things, including, whether the heart is enlarged; whether fluid is building up in the lungs; and whether pacemaker / defibrillator leads are still in place. 5)  Your physician has requested that you regularly monitor and record your blood pressure readings at home.  Please use the same machine at the same time of day to check your readings and record them to bring to your follow-up visit. 6)  Nurse Visit in 3 wweks - please bring blood pressure record to nurse visit with you Prescriptions: LISINOPRIL 20 MG TABS (LISINOPRIL) Take one tablet by mouth daily  #30 x 6   Entered by:   Teressa Lower RN   Authorized by:   Kathlen Brunswick, MD, [facility]   Signed by:   Teressa Lower RN on 09/24/2009   Method used:   Electronically to        [facility]* (retail)       [address]       [place], [place]  [address]       Ph: [phone]       Fax: [phone]   RxID:   [id number] DILTIAZEM HCL ER BEADS 360 MG XR24H-CAP (DILTIAZEM HCL ER BEADS) Take one capsule by mouth daily  #30 x 6   Entered by:   Teressa Lower RN   Authorized by:   Kathlen Brunswick, MD, [facility]   Signed by:   Teressa Lower RN on 09/24/2009   Method used:   Electronically to        [facility]* (retail)       [address]       [place], [place]  [address]       Ph: [phone]       Fax: [phone]   RxID:   [id number] TORSEMIDE 100 MG TABS (TORSEMIDE) Take 1/2  tablet by mouth daily.  #15 x 3   Entered by:   Teressa Lower RN   Authorized by:   Kathlen Brunswick, MD, [facility]   Signed by:   Teressa Lower RN on 09/24/2009   Method used:   Electronically to        [facility]* (retail)       [address]       [place], [place]  [address]  Ph: [phone]       Fax: [phone]   RxID:   [id number]

## 2010-05-11 NOTE — Progress Notes (Signed)
**Note De-Identified Cletis Muma Obfuscation** Summary: sob  Phone Note Call from Patient Call back at Home Phone [phone]   Caller: pt daughter tanya Reason for Call: Talk to Nurse Summary of Call: pt is still having SOB since last visit needs to know what next step will be. Samara Deist lawrence did several test last time but nothing has helped  pt phone number above [phone] is a working number. Initial call taken by: Faythe Ghee,  May 07, 2009 9:52 AM  Follow-up for Phone Call        Phone number has been disconnected. Follow-up by: Larita Fife Kyen Taite LPN,  May 07, 2009 1:31 PM  Additional Follow-up for Phone Call Additional follow up Details #1::        Appt. scheduled for Feb. 7 @ 2:00pm with Zonia Kief. Additional Follow-up by: Larita Fife Maleeha Halls LPN,  May 07, 2009 5:01 PM

## 2010-05-11 NOTE — Procedures (Signed)
Summary: Upper Endoscopy  Patient: [name] Note: All result statuses are Final unless otherwise noted.  Tests: (1) Upper Endoscopy (EGD)   EGD Upper Endoscopy       DONE     [facility]     [address]     [place], [place]  [address]           ENDOSCOPY PROCEDURE REPORT           PATIENT:  [name], [name]  MR#:  [id number]     BIRTHDATE:  1929/05/06, 80 yrs. old  GENDER:  female           ENDOSCOPIST:  Iva Boop, MD, [facility]     Referred by:  [facility]           PROCEDURE DATE:  03/08/2010     PROCEDURE:  EGD with biopsy, [id number]     ASA CLASS:  Class III     INDICATIONS:  hematemesis, melena, abdominal pain           MEDICATIONS:   Fentanyl 62.5 mcg IV, Versed 6 mg IV     TOPICAL ANESTHETIC:  Cetacaine Spray           DESCRIPTION OF PROCEDURE:   After the risks benefits and     alternatives of the procedure were thoroughly explained, informed     consent was obtained.  The EG-[id number] ([id number]) endoscope was     introduced through the mouth and advanced to the second portion of     the duodenum, without limitations.  The instrument was slowly     withdrawn as the mucosa was fully examined.     <<PROCEDUREIMAGES>>           Multiple ulcers were found in the body of the stomach. Large area     of acute ulceration with significant exudate and satellite ulcers     surrounding the larger ulcer. Located on lesser curve. Biopsied at     periphery. Some of exudate removed revealing heaped-up, violaceous     mucosa. I suspect this is ischemic in origin. Multiple biopsies     were obtained and sent to pathology.  Erythema was found at the     gastroesophageal junction.  Otherwise the examination was normal.     Retroflexed views revealed findings as previously described.    The     scope was then withdrawn from the patient and the procedure     completed.           COMPLICATIONS:  None           ENDOSCOPIC IMPRESSION:     1) Ulcers, multiple  in the body of the stomach - I suspect acute     ischemia (? from celiac artery axis, which ahas been stented . Was     on warfarin so less likely embolic due to A fib)     2) Erythema at the gastroesophageal junction - no ulcers     3) Otherwise normal examination     RECOMMENDATIONS:     1) I have consulted vascular surgery to further evaluate given     impression of ischemic ulceration and prior celiac stent.     2) Supportive care, anticoagulation is ok if recommended but     think IV or subcutaneous makes sense now. Her warfarin (for Afib)     has been held.     3) May need cardiology input also.  REPEAT EXAM:  In for as needed.           Iva Boop, MD, [facility]           CC:  [name]. Darrick Penna, MD, Marinus Maw, MD, Kirstie Peri, MD           n.     eSIGNED:   Iva Boop at 03/08/2010 12:35 PM           Ardean Larsen, [id number]  Note: An exclamation mark (!) indicates a result that was not dispersed into the flowsheet. Document Creation Date: 03/08/2010 12:36 PM _______________________________________________________________________  (1) Order result status: Final Collection or observation date-time: 03/08/2010 12:23 Requested date-time:  Receipt date-time:  Reported date-time:  Referring Physician:   Ordering Physician: Stan Head [phone]) Specimen Source:  Source: Launa Grill Order Number: [id number] Lab site:

## 2010-05-11 NOTE — Progress Notes (Signed)
**Note De-Identified Nichole Neyer Obfuscation** Summary: Results of Chest X-Ray  Phone Note Call from Patient Call back at [phone]   Caller: Daughter [name]) Reason for Call: Lab or Test Results Summary of Call: results of chest x-ray done last week/tg Initial call taken by: Raechel Ache [facility],  October 01, 2009 10:05 AM  Follow-up for Phone Call        [name], pt's daughter, advised of CXR results. Follow-up by: Larita Fife Gwenette Wellons LPN,  October 01, 2009 10:58 AM

## 2010-05-11 NOTE — Miscellaneous (Signed)
Summary: LABS TSH,T4,05/16/2009,CBCD,03/18/2009  Clinical Lists Changes  Observations: Added new observation of TSH: 15.706 microintl units/mL (05/16/2009 9:30) Added new observation of T4, FREE: 1.18 ng/dL (16/01/9603 5:40) Added new observation of ABSOLUTE BAS: 0.1 K/uL (03/18/2009 9:30) Added new observation of BASOPHIL %: 1 % (03/18/2009 9:30) Added new observation of EOS ABSLT: 0.2 K/uL (03/18/2009 9:30) Added new observation of % EOS AUTO: 3 % (03/18/2009 9:30) Added new observation of ABSOLUTE MON: 0.9 K/uL (03/18/2009 9:30) Added new observation of MONOCYTE %: 14 % (03/18/2009 9:30) Added new observation of ABS LYMPHOCY: 2.2 K/uL (03/18/2009 9:30) Added new observation of LYMPHS %: 33 % (03/18/2009 9:30) Added new observation of PLATELETK/UL: 260 K/uL (03/18/2009 9:30) Added new observation of RDW: 14.4 % (03/18/2009 9:30) Added new observation of MCHC RBC: 32.9 g/dL (98/02/9146 8:29) Added new observation of MCV: 89.1 fL (03/18/2009 9:30) Added new observation of HCT: 36.8 % (03/18/2009 9:30) Added new observation of HGB: 12.1 g/dL (56/21/3086 5:78) Added new observation of RBC M/UL: 4.13 M/uL (03/18/2009 9:30) Added new observation of WBC COUNT: 6.5 10*3/microliter (03/18/2009 9:30)

## 2010-05-11 NOTE — Letter (Signed)
Summary: BP READINGS  BP READINGS   Imported By: Faythe Ghee 10/23/2009 12:58:55  _____________________________________________________________________  External Attachment:    Type:   Image     Comment:   External Document

## 2010-05-11 NOTE — Assessment & Plan Note (Signed)
Summary: F1M   Visit Type:  Follow-up Primary Provider:  Dr.Shah   History of Present Illness: Return visit is scheduled for this delightful 77 year old woman with a history of congestive heart failure with preserved left ventricular systolic function.  She has class 2-3 dyspnea on exertion, which is chronic.  She has had no peripheral edema.  Weight has varied by 3 pounds plus or minus.  Since discontinuing amiodarone, there has been a chronic progressive increase in heart rate, most recently to values that are frequently just above 100 beats per minute, a fact that concerns the patient and her daughter. No recent thyroid profile or lipid profile is available.  Metabolic profile in February showed normal electrolytes and a slightly elevated creatinine of 1.5.  Current Medications (verified): 1)  Torsemide 20 Mg Tabs (Torsemide) .... Take 2 Tablets Once Daily 2)  Prilosec 20 Mg Cpdr (Omeprazole) .... One Tab By Mouth Daily 3)  Synthroid 50 Mcg Tabs (Levothyroxine Sodium) .... 1/2 Tab By Mouth Daily 4)  Aspirin 81 Mg Tabs (Aspirin) .... One Tab By Mouth Daily 5)  Coumadin .... As Directed 6)  Colace 100 Mg Caps (Docusate Sodium) .... Take 1 Tablet By Mouth Two Times A Day 7)  Pravastatin Sodium 40 Mg Tabs (Pravastatin Sodium) .... Take One Tablet By Mouth Daily At Bedtime 8)  Klonopin 0.5 Mg Tabs (Clonazepam) .... Take 1 Tab By Mouth At Bedtime 9)  Ferrous Sulfate 325 (65 Fe) Mg Tbec (Ferrous Sulfate) .... Take 1 Tab Daily 10)  Amlodipine Besylate 5 Mg Tabs (Amlodipine Besylate) .... Take 1 Tab Daily 11)  Toprol Xl 50 Mg Xr24h-Tab (Metoprolol Succinate) .... Take 1 Tablet By Mouth Once A Day  Allergies (verified): No Known Drug Allergies  Past History:  PMH, FH, and Social History reviewed and updated.  Family History: Father died at age 77-suicide Mother died at age 76 with neoplastic disease Siblings: sister has a history of coronary disease with CABG surgery and  Alzheimer's Daughter died at age 60 due to neoplastic disease of the bone  Positive FH of Diabetes, Hypertension    Review of Systems       See HPI.  Vital Signs:  Patient profile:   77 year old female Weight:      166 pounds Pulse rate:   100 / minute BP sitting:   143 / 74  (right arm)  Vitals Entered By: Dreama Saa, CNA (June 26, 2009 1:17 PM)  Physical Exam  General:    well developed, well nourished, in no acute distress. HEENT: PERRLA/EOM intact; conjunctiva and lids normal. Teeth, gums and palate normal. Oral mucosa normal. Neck:left carotid bruit Lungs: Clear bilaterally to auscultation and percussion. Heart: Non-displaced PMI, chest non-tender; regular rate and rhythm, S1, S2 without murmurs, rubs or gallops.  Abdomen:Bowel sounds positive; epigastric bruit present; abdomen soft and non-tender without masses, organomegaly, or hernias noted. No hepatosplenomegaly. Pulses:    palpable bilaterally Extremities:    No clubbing or cyanosis; olecranon bursae are fluid filled bilaterally; rheumatoid deformity of fingers Neurologic: Alert and oriented x 3. Skin: substantial bruising around the left shoulder Psych:     Normal affect.    Impression & Recommendations:  Problem # 1:  ATHEROSCLEROTIC CARDIOVASCULAR DISEASE-CABG (ICD-429.2) No symptoms at present to suggest myocardial ischemia.  Management will continue to center on optimal control of vascular risk factors.  Problem # 2:  ATRIAL FIBRILLATION (ICD-427.31) Atrial fibrillation has recurred off amiodarone.  Heart rate has only been mildly elevated, currently 100  beatminute.  Metoprolol will be added at a total dose of 50 mg per day for better control of heart rate and blood pressure.  Problem # 3:  CHRONIC DIASTOLIC HEART FAILURE (ICD-428.32) CHF appears well-controlled with current diuretic dose, which will be continued.  Serum potassium was 5.1 when last assessed.  Her potassium chloride replacement will be  discontinued.  A repeat metabolic profile will be obtained in one month.  Problem # 4:  CEREBROVASCULAR DISEASE (ICD-437.9) Recent vascular study shows moderate stenosis bilaterally with no progression of disease.  Due to widespread vascular disease, optimal control of lipids is warranted.  A repeat lipid profile will be obtained.  Problem # 5:  HYPOTHYROIDISM (ICD-244.9) During recent hospitalization, TSH elevation to 15 was noted with normal T4 and T3.  She has been started on low-dose levothyroxine.  We will seek reports for subsequent TSH determinations and obtain one if we cannot locate a previous value.  I will plan see this nice woman again in 3 months.  Other Orders: Future Orders: T-Comprehensive Metabolic Panel ([id number]) ... 07/27/2009 T-Lipid Profile [id number]) ... 07/27/2009 T-TSH [id number]) ... 07/27/2009  Patient Instructions: 1)  Your physician recommends that you schedule a follow-up appointment in: 3 months 2)  Your physician recommends that you return for lab work in: 1 month 3)  Your physician has recommended you make the following change in your medication: toprol 50mg  daily, STOP KCL Prescriptions: TOPROL XL 50 MG XR24H-TAB (METOPROLOL SUCCINATE) Take 1 tablet by mouth once a day  #30 x 6   Entered by:   Teressa Lower RN   Authorized by:   Kathlen Brunswick, MD, [facility]   Signed by:   Teressa Lower RN on 06/26/2009   Method used:   Electronically to        [facility]* (retail)       [address]       [place], [place]  [address]       Ph: [phone]       Fax: [phone]   RxID:   [id number]

## 2010-05-11 NOTE — Letter (Signed)
Summary: [facility] [employer]  [address], [place] [address]   Phone: [phone]  Fax: [phone]     June 26, 2009 MRN: [id number]   [facility] [address] [place], [place]  [address]      YOUR LAB WORK IS DUE   _________APRIL 18, 2011________________________________  Please go to [facility], located across the street from [facility] on the second floor.  Hours are Monday - Friday 7am until 7:30pm         Saturday 8am until 12noon    _X_  DO NOT EAT OR DRINK AFTER MIDNIGHT EVENING PRIOR TO LABWORK  __ YOUR LABWORK IS NOT FASTING --YOU MAY EAT PRIOR TO LABWORK

## 2010-05-11 NOTE — Progress Notes (Signed)
Summary: NURSE VISIT QUESTION  Phone Note Call from Patient Call back at [phone]   Caller: PT DAUGHTER Reason for Call: Talk to Nurse Summary of Call: PT DAUGHTER WOULD LIKE TO KNOW WHY SHE NEEDS TO COME FOR NURSE VISIT, OR COULD IT WAIT TILL SHE HAD DOCTORS APPT. Initial call taken by: Faythe Ghee,  October 13, 2009 12:05 PM  Follow-up for Phone Call        pt and daughter are unable to make nurses appt. called back and gave bp record Follow-up by: Teressa Lower RN,  October 13, 2009 1:04 PM

## 2010-05-11 NOTE — Progress Notes (Signed)
Summary: LISINOPRIL CAUSING PT TO BE DIZZY  Phone Note Call from Patient Call back at [phone]   Caller: PT [name] Reason for Call: Talk to Nurse Summary of Call: PT WAS PUT BACK ON LISINOPRIL SINCE LAST APP AND HAS BEEN HAVINING DIZZY SPELLS AND HAS BEEN FALLING. IT HAS BEEN WORKING FOR HER BP. Initial call taken by: Faythe Ghee,  October 26, 2009 10:53 AM  Follow-up for Phone Call        symptoms began 1 1/2 weeks ago, bp is ok, pt is dizzy and balance is off  asked for a nurse visit for ortho vitals Follow-up by: Teressa Lower RN,  October 27, 2009 2:51 PM     Appended Document: LISINOPRIL CAUSING PT TO BE DIZZY I spoke with pt today and she stated she is feeling much better and that I can call in her lisinopril for a 90day supply

## 2010-05-11 NOTE — Assessment & Plan Note (Signed)
Summary: ROV   Visit Type:  Follow-up Primary Provider:  Dr.Shaw  CC:  Post-Hospital Follow-Up.  History of Present Illness: Mrs. Pascoe is a very pleasant 77 y/o CF with known history of diastolic CHF, Afib, asymptomatic bradycardia, diabetes, RAS, hypertension, and CKD stage IV.  She was recently admitted to the [facility] from Feb 4-6th with acute exacerbation of CHF.  She admitted to dietary noncompliance eating potatoe chips a few days prior to onset of symptoms.  During hospitalization, she was diuresed with Lasix 60mg  three times a day and removal of 1 liter of fluid was completed.  She was found to have mild anemia, and hypothyroidism with TSH 15.076.  After diureses there was worsening of renal fx from a Creat 1.5-2.0.  She was sent home on torosemide 20mg  two times a day.  With no other changes in her medication regiimine.  She is here for follow-up.  She continues to have some DOE but no chest discomfort.  She admits to some dizziness.  EKG in the office shows marked bradycardia with HR of 45bpm.  Atrial response was very slow and intrinsic junctional rate overrode.  She states that when she was in the hospital, she was awakened by the nurses who said her HR was too slow.  She was asymptomatic.  Preventive Screening-Counseling & Management  Alcohol-Tobacco     Alcohol drinks/day: 0     Smoking Status: never  Current Medications (verified): 1)  Torsemide 20 Mg Tabs (Torsemide) .... Take 1 Tablet By Mouth Two Times A Day 2)  Prilosec 20 Mg Cpdr (Omeprazole) .... One Tab By Mouth Daily 3)  Synthroid 50 Mcg Tabs (Levothyroxine Sodium) .... 1/2 Tab By Mouth Daily 4)  Aspirin 81 Mg Tabs (Aspirin) .... One Tab By Mouth Daily 5)  Coumadin .... As Directed 6)  Colace 100 Mg Caps (Docusate Sodium) .... Take 1 Tablet By Mouth Two Times A Day 7)  Glucophage 500 Mg Tabs (Metformin Hcl) .... Take 1 Tablet By Mouth Once A Day 8)  Klor-Con 20 Meq Pack (Potassium Chloride) .... Take 1 Tablet By Mouth  Once A Day 9)  Pravastatin Sodium 40 Mg Tabs (Pravastatin Sodium) .... Take One Tablet By Mouth Daily At Bedtime 10)  Klonopin 0.5 Mg Tabs (Clonazepam) .... Take 1 Tab By Mouth At Bedtime 11)  Ferrous Sulfate 325 (65 Fe) Mg Tbec (Ferrous Sulfate) .... Take 1 Tab Daily 12)  Amlodipine Besylate 5 Mg Tabs (Amlodipine Besylate) .... Take 1 Tab Daily  Allergies (verified): No Known Drug Allergies PMH-FH-SH reviewed-no changes except otherwise noted  Social History: Alcohol drinks/day:  0  Review of Systems       All other systems have been reviewed and are negative unless stated above.   Vital Signs:  Patient profile:   77 year old female Weight:      163 pounds Pulse rate:   51 / minute BP sitting:   132 / 65  (right arm)  Vitals Entered By: Dreama Saa, CNA (May 19, 2009 3:17 PM)  Physical Exam  General:  Well developed, well nourished, in no acute distress. Eyes:  Glasses Lungs:  Dimished in the L base. Heart:  Regular rhythm, bradycardic. 1/6 systolic murmur. Abdomen:  Soft non-tender 2+ Bowel Sounds. Msk:  Some spongey soft tissue swelling over both elbows. Extremities:  trace left pedal edema and trace right pedal edema.   Neurologic:  Alert and oriented x 3. Psych:  Normal affect.   EKG  Procedure date:  05/19/2009  Findings:      Slow atrial response.  Sinus bradycardia with rate of:  45 bpm.  Impression & Recommendations:  Problem # 1:  SINUS BRADYCARDIA (ICD-427.81) I have asked Dr. Dietrich Pates to review medications and EKG with me to ascertain best course of action concerning medication adjustments.  I feel that her amioderone should be discontinued as there is no evidence of Afib and she is bradycardic.  Knowning that amioderone has a long half-life, it may be a few months before HR increase occurs.  Dr. Dietrich Pates has reviewed this and is agreement that amioderone will be discontinued.  Also, her TSH is very abnormal.  I believe the amioderone is  contributing to this. Will leave synthroid adjustment to Dr. Sherryll Burger. The following medications were removed from the medication list:    Amiodarone Hcl 200 Mg Tabs (Amiodarone hcl) .Marland Kitchen... Take one tablet by mouth daily    Lisinopril 40 Mg Tabs (Lisinopril) .Marland Kitchen... Take 1 tab daily Her updated medication list for this problem includes:    Aspirin 81 Mg Tabs (Aspirin) ..... One tab by mouth daily    Amlodipine Besylate 5 Mg Tabs (Amlodipine besylate) .Marland Kitchen... Take 1 tab daily  Problem # 2:  CHRONIC DIASTOLIC HEART FAILURE (ICD-428.32) Will continue to monitor kideny fx.  She has a history of RAS, and RI.  BMET will be drawn in 1 week.  In the interim, we will stop her lisinopril and also her glucophage.  She is advised to see her primary for alternative means of blood glucose control.  Glucophage is likely to cause lactic acidosis in patients with RI. We are taking measures to avoid this.  She is advised to check her BP daily.  If she is consistantly hypterensive, Systolic >160. We may need to increase her Norvasc to 10mg .  We are going to see her in one month for follow-up. The following medications were removed from the medication list:    Amiodarone Hcl 200 Mg Tabs (Amiodarone hcl) .Marland Kitchen... Take one tablet by mouth daily    Lisinopril 40 Mg Tabs (Lisinopril) .Marland Kitchen... Take 1 tab daily Her updated medication list for this problem includes:    Torsemide 20 Mg Tabs (Torsemide) .Marland Kitchen... Take 1 tablet by mouth two times a day    Aspirin 81 Mg Tabs (Aspirin) ..... One tab by mouth daily    Amlodipine Besylate 5 Mg Tabs (Amlodipine besylate) .Marland Kitchen... Take 1 tab daily  Future Orders: T-Basic Metabolic Panel [id number]) ... 05/26/2009  Problem # 3:  ATRIAL FIBRILLATION (ICD-427.31) Once amioderone is out of her system, we will revalutae if Afib reoccurs.  For now, we will continue coumadin tx.  If she remains in NSR for 1 year, we will consider stopping this. The following medications were removed from the medication  list:    Amiodarone Hcl 200 Mg Tabs (Amiodarone hcl) .Marland Kitchen... Take one tablet by mouth daily Her updated medication list for this problem includes:    Aspirin 81 Mg Tabs (Aspirin) ..... One tab by mouth daily  Other Orders: T-US Abd/Pelvis ([place] Abd/Pelvis)  Patient Instructions: 1)  Your physician recommends that you schedule a follow-up appointment in: 1 month 2)  Your physician recommends that you return for lab work in: 1 week and schedule for Post void bladder US. 3)  Stop taking Lisinopril, Metformin and Amiodarone

## 2010-05-11 NOTE — Letter (Signed)
Summary: [facility]   Imported By: Roderic Ovens 08/24/2009 10:30:55  _____________________________________________________________________  External Attachment:    Type:   Image     Comment:   External Document

## 2010-05-11 NOTE — Letter (Signed)
Summary: Appointment - Missed  [facility] [place]  [address], [place] [address]   Phone: [phone]  Fax: [phone]     October 15, 2009 MRN: [id number]   PRICELLA GAUGH [address] [place], [place]  [address]   Dear Ms. Poss,  Our records indicate you missed your appointment on         10/15/09 NURSE VISIT                                    It is very important that we reach you to reschedule this appointment. We look forward to participating in your health care needs. Please contact us at the number listed above at your earliest convenience to reschedule this appointment.     Sincerely,    Glass blower/designer

## 2010-05-11 NOTE — Miscellaneous (Signed)
Summary: CMP, Lipids, eGFR abd TSH (07-27-09)  Clinical Lists Changes  Observations: Added new observation of CALCIUM: 8.9 mg/dL (16/01/9603 54:09) Added new observation of ALBUMIN: 3.1 g/dL (81/19/1478 29:56) Added new observation of PROTEIN, TOT: 7.6 g/dL (21/30/8657 84:69) Added new observation of SGPT (ALT): 14 units/L (07/27/2009 16:12) Added new observation of SGOT (AST): 21 units/L (07/27/2009 16:12) Added new observation of ALK PHOS: 99 units/L (07/27/2009 16:12) Added new observation of BILI DIRECT: 0.5 mg/dL (62/95/2841 32:44) Added new observation of GFR AA: .60 mL/min/1.59m2 (07/27/2009 16:12) Added new observation of CO2 PLSM/SER: 27.0 meq/L (07/27/2009 16:12) Added new observation of CL SERUM: 107 meq/L (07/27/2009 16:12) Added new observation of K SERUM: 3.7 meq/L (07/27/2009 16:12) Added new observation of NA: 141 meq/L (07/27/2009 16:12) Added new observation of LDL: 94 mg/dL (04/13/7251 66:44) Added new observation of HDL: 37 mg/dL (03/47/4259 56:38) Added new observation of TRIGLYC TOT: 109 mg/dL (75/64/3329 51:88) Added new observation of CHOLESTEROL: 153 mg/dL (41/66/0630 16:01) Added new observation of GFR: 57 mL/min (07/27/2009 16:12) Added new observation of CREATININE: 0.95 mg/dL (09/32/3557 32:20) Added new observation of BUN: 13 mg/dL (25/42/7062 37:62) Added new observation of BG RANDOM: 117 mg/dL (83/15/1761 60:73) Added new observation of TSH: 10.40 microintl units/mL (07/27/2009 16:12)

## 2010-05-11 NOTE — Progress Notes (Signed)
Summary: Results  Phone Note Call from Patient Call back at [phone]   Caller: Daughter [name]) Reason for Call: Lab or Test Results Summary of Call: Pt's daughter would like results of U/S done on Thursday / advised that it was not signed off on by Physician as of right now, but would leave message for nurse to call when it is / pls call at above # /tg Initial call taken by: Raechel Ache [facility],  November 30, 2009 11:17 AM  Follow-up for Phone Call        I called pt and daughter, per pt's permission, results given, pt verbalized understanding. Follow-up by: Teressa Lower RN,  December 02, 2009 8:34 AM

## 2010-05-11 NOTE — Progress Notes (Signed)
Summary: rx lost  Phone Note Call from Patient Call back at Home Phone [phone]   Caller: [name] daughter Reason for Call: Refill Medication Summary of Call: PT lost rx that was givin to her in hospital it was a fluid pill needs it [facility] [phone] Initial call taken by: Faythe Ghee,  June 05, 2009 10:11 AM    New/Updated Medications: TORSEMIDE 20 MG TABS (TORSEMIDE) take 2 tablets once daily Prescriptions: TORSEMIDE 20 MG TABS (TORSEMIDE) take 2 tablets once daily  #60 x 3   Entered by:   Teressa Lower RN   Authorized by:   Joni Reining, NP   Signed by:   Teressa Lower RN on 06/05/2009   Method used:   Electronically to        [facility]* (retail)       [address]       [place], [place]  [address]       Ph: [phone]       Fax: [phone]   RxID:   [id number]

## 2010-05-11 NOTE — Progress Notes (Signed)
Summary: follow up appt and bp readings  Phone Note Call from Patient Call back at Home Phone [phone]   Caller: pt daughter tanya Reason for Call: Talk to Nurse Summary of Call: pt daughter would like to talk to you about follow up appt with dr Dietrich Pates. She wants to see if pt needs to be seen and go over BP readings. Initial call taken by: Faythe Ghee,  January 20, 2010 9:25 AM  Follow-up for Phone Call        follow up with NP Follow-up by: Teressa Lower RN,  January 20, 2010 9:56 AM

## 2010-05-11 NOTE — Assessment & Plan Note (Signed)
Summary: BP   Visit Type:  Follow-up Primary Provider:  Dr.Shah   History of Present Illness: [name] is a very pleasant 77 y/o CF with known history of Afib, on coumadin, hypertension,, CHF with perserved EF.  She has had some complaint in the past with dizziness with postition movement, but this has been resolving.  She does feel a little dizzy when she bends over, but it passes quickly.  She fell in the past  and has sustained a muscle strain in the R groin, which is becoming less painful, but is still worrisome to her.  She denies evidence of bleeding on coumadin.  She wishes to be taken off of some of her medications. She does have some complaints of foul smelling urine.  Current Medications (verified): 1)  Torsemide 100 Mg Tabs (Torsemide) .... Take 1/2  Tablet By Mouth Daily. 2)  Prilosec 20 Mg Cpdr (Omeprazole) .... One Tab By Mouth Daily 3)  Synthroid 50 Mcg Tabs (Levothyroxine Sodium) .Marland Kitchen.. 1 Tab By Mouth Daily 4)  Aspirin 81 Mg Tabs (Aspirin) .... One Tab By Mouth Daily 5)  Coumadin .... As Directed 6)  Colace 100 Mg Caps (Docusate Sodium) .... Take 1 Tablet By Mouth Two Times A Day 7)  Pravastatin Sodium 40 Mg Tabs (Pravastatin Sodium) .... Take One Tablet By Mouth Daily At Bedtime 8)  Klonopin 0.5 Mg Tabs (Clonazepam) .... Take 1 Tab By Mouth At Bedtime 9)  Diltiazem Hcl Er Beads 360 Mg Xr24h-Cap (Diltiazem Hcl Er Beads) .... Take One Capsule By Mouth Daily 10)  Tramadol Hcl 50 Mg Tabs (Tramadol Hcl) .... Take 1 Tab Am 11)  Lisinopril 20 Mg Tabs (Lisinopril) .... Take One Tablet By Mouth Daily  Allergies (verified): No Known Drug Allergies  Past History:  Past medical, surgical, family and social histories (including risk factors) reviewed, and no changes noted (except as noted below).  Past Medical History: Reviewed history from 06/25/2009 and no changes required. ASCVD:CABGx3 LIMA- LAD, SVG- OM1 and OM 3, SVG-OM2 Paroxysmal atrial fibrillation; anticoagulation  therapy PVD: Right SFA and ant. tibial embolectomy, endarterectomy and patch angioplasty in 10/09 for ischemic rt. 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 Appendectomy Coronary artery bypass graft surgery Vascular surgery of the right lower extremity-2009 Right and left TKA BTL  Family History: Reviewed history from 06/26/2009 and no changes required. Father died at age 80-suicide Mother died at age 16 with neoplastic disease Siblings: sister has a history of coronary disease with CABG surgery and Alzheimer's Daughter died at age 58 due to neoplastic disease of the bone  Positive FH of Diabetes, Hypertension    Social History: Reviewed history from 06/05/2008 and no changes required. Tobacco Use - No.  Alcohol Use - no Drug Use - no  Review of Systems       foul smelling urine, Right groin pain.   All other systems have been reviewed and are negative unless stated above.   Vital Signs:  Patient profile:   77 year old female Weight:      169 pounds BMI:     31.02 Pulse rate:   79 / minute BP sitting:   139 / 75  (  right arm)  Vitals Entered By: Dreama Saa, CNA (November 24, 2009 1:18 PM)  Physical Exam  General:  Well developed, well nourished, in no acute distress. Lungs:  Clear bilaterally to auscultation and percussion. Heart:  Irregulary regular.  1/6 systolic murmur.   Abdomen:  Soft non tender, no abdominal bruits are noted. Msk:  Palpation of R groin elicits some tenderness, but no hematoma or errythema is notes. Pulses:  Bruit is noted in the R femeral area, none on the Left. Extremities:   trace left pedal edema and trace right pedal edema.  trace left pedal edema.   Neurologic:  Alert and oriented x 3. Skin:  Pale Psych:  Normal affect.   EKG  Procedure date:  11/24/2009  Findings:      Atrial fibrillation with a controlled ventricular response rate of: 74bpm  Impression & Recommendations:  Problem # 1:  ATRIAL FIBRILLATION (ICD-427.31) Assessment Unchanged Her rate is well controlled. She is asymptomatic.  Plan to continue current medications.  Coumadin clinic appointments as scheduled. Her updated medication list for this problem includes:    Aspirin 81 Mg Tabs (Aspirin) ..... One tab by mouth daily  Orders: EKG w/ Interpretation (93000)  Problem # 2:  PAIN, EXTREMITY (ICD-729.5) Assessement of right leg and groin secondary to "pulled muscle" revealed a femeral bruit.  No bruit was ascultated on the left.  No abdominal bruits were heard.  Plan doppler ultrasound of R femeral artery, ABI's.  Pain is improved over last several weeks. The bruit is an incidental finding, however, this will be evaluated futher. Orders: ABI (ABI)  Problem # 3:  DYSURIA (ICD-788.1) Will get U/A to evaluate for UTI.  If antibiotic is necessary, coumadin clinic will be informed for INR changes. Orders: T-Urinalysis ([id number])  Other Orders: Arterial Duplex Lower Extremity (Arterial Dup Lower E)  Patient Instructions: 1)  Your physician recommends that you schedule a follow-up appointment in: 1 MONTH WITH KATHRYN. 2)  Your physician recommends that you continue on your current medications as directed. Please refer to the Current Medication list given to you today. 3)  Your physician has requested that you have an ankle brachial index (ABI). During this test an ultrasound and blood pressure cuff are used to evaluate the arteries that supply the arms and legs with blood. Allow thirty minutes for this exam. There are no restrictions or special instructions. 4)  Your physician has  requested that you have a lower extremity arterial duplex.  This test is an ultrasound of the arteries in the legs or arms.  It looks at arterial blood flow in the legs and arms.  Allow one hour for Lower and Upper Arterial scans. There are no restrictions or special instructions. 5)  Your physician recommends that you return for lab work to check urinalysis at the [facility]. If you have to be put on an antibiotic,please inform Misty Stanley so that your coumadin will be adjusted.

## 2010-05-11 NOTE — Progress Notes (Signed)
Summary: bp all over the place  Phone Note Call from Patient Call back at Home Phone [phone]   Caller: pt daughter tanya Reason for Call: Talk to Nurse Summary of Call: pt was taken off some meds at last visit and now her BP is "all over the place" ranging from 116/49 to 183/73 hr also has been at lowest 49 and highest 65 Initial call taken by: Faythe Ghee,  June 04, 2009 2:29 PM  Follow-up for Phone Call        Pt's daughter is concerned with pt's BP and heart rate since med changes on last office visit of 05-19-09. Lisinopril, Metformion and Amiodarone was d/c. Daughter states Mrs. Kizer has been having headaches and slight dizziness. She wants to know if there is somthing else we can do? Daughter is leaving town this earily next week and would like to hear back today if possible. Follow-up by: Larita Fife Via LPN,  June 05, 2009 9:25 AM  Additional Follow-up for Phone Call Additional follow up Details #1::        I advised daughter that if Mrs. Kosinski becomes symptomatic over the weekend to take her to ED. She stated she understands.-    Additional Follow-up for Phone Call Additional follow up Details #2::    Make sure she has a follow-up appointment with her BP machine to be checked against ours.   Appended Document: bp all over the place    Phone Note Outgoing Call   Call placed by: Larita Fife Via LPN,  June 09, 2009 9:57 AM Summary of Call: Patient advised to bring BP cuff with her to next OV on 06-26-09 @ 1:00pm. Pt. states she will bring BP cuff with her.

## 2010-05-11 NOTE — Assessment & Plan Note (Signed)
Summary: rov   Visit Type:  Follow-up Primary Provider:  Dr.Shah   History of Present Illness: [name] is a pleasant 55 CF withn known history of permanent atrial fibrillation, coumadin clinic per Dr. Sherryll Burger, CAD s/p CABG in 10/09, bradycardia in the past (not ready for PMK), RAS, chronic diastolic HF and hypertension.  She presents today on follow-up and is without complaints.  She has recently been recovering from bronchitis for the last 3 weeks with frequent cough.  She was seen by Dr. Sherryll Burger and placed on  amoxicillin and cough meds and has been feeling so much better. She denies chest pain, SOB, dizziness or edema.  She is accompained by her daughter.  Current Medications (verified): 1)  Torsemide 100 Mg Tabs (Torsemide) .... Take 1/2  Tablet By Mouth Daily. 2)  Prilosec 20 Mg Cpdr (Omeprazole) .... One Tab By Mouth Daily 3)  Synthroid 50 Mcg Tabs (Levothyroxine Sodium) .Marland Kitchen.. 1 Tab By Mouth Daily 4)  Aspirin 81 Mg Tabs (Aspirin) .... One Tab By Mouth Daily 5)  Coumadin .... As Directed 6)  Colace 100 Mg Caps (Docusate Sodium) .... Take 1 Tablet By Mouth Two Times A Day 7)  Pravastatin Sodium 40 Mg Tabs (Pravastatin Sodium) .... Take One Tablet By Mouth Daily At Bedtime 8)  Klonopin 0.5 Mg Tabs (Clonazepam) .... Take 1 Tab By Mouth At Bedtime 9)  Diltiazem Hcl Er Beads 360 Mg Xr24h-Cap (Diltiazem Hcl Er Beads) .... Take One Capsule By Mouth Daily 10)  Tramadol Hcl 50 Mg Tabs (Tramadol Hcl) .... Take 1 Tab Am 11)  Lisinopril 20 Mg Tabs (Lisinopril) .... Take One Tablet By Mouth Daily  Allergies (verified): No Known Drug Allergies  Comments:  Nurse/Medical Assistant: patient brought med list and reviewed previous med list from ov stated all meds are correct.  Past History:  Past medical, surgical, family and social histories (including risk factors) reviewed, and no changes noted (except as noted below).  Past Medical History: Reviewed history from 06/25/2009 and no changes  required. ASCVD:CABGx3 LIMA- LAD, SVG- OM1 and OM 3, SVG-OM2 Paroxysmal atrial fibrillation; anticoagulation therapy PVD: Right SFA and ant. tibial embolectomy, endarterectomy and patch angioplasty in 10/09 for ischemic rt. 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 Appendectomy Coronary artery bypass graft surgery Vascular surgery of the right lower extremity-2009 Right and left TKA BTL  Family History: Reviewed history from 06/26/2009 and no changes required. Father died at age 12-suicide Mother died at age 41 with neoplastic disease Siblings: sister has a history of coronary disease with CABG surgery and Alzheimer's Daughter died at age 66 due to neoplastic disease of the bone  Positive FH of Diabetes, Hypertension    Social History: Reviewed history from 06/05/2008 and no changes required. Tobacco Use - No.  Alcohol Use - no Drug Use - no  Review of Systems       All other systems have been reviewed and are negative unless stated above.   Vital Signs:  Patient profile:   77 year old female Weight:      168 pounds BMI:     30.84 Pulse rate:   76 / minute BP sitting:   102 /  69  (right arm)  Vitals Entered By: Dreama Saa, CNA (January 21, 2010 1:26 PM)  Physical Exam  General:  Well developed, well nourished, in no acute distress.healthy appearing.   Lungs:  Clear bilaterally to auscultation and percussion. Heart:  Non-displaced PMI, chest non-tender; irregular rate and rhythm, S1, S2 without murmurs, rubs or gallops. Carotid upstroke normal,soft bilateral bruit. . Pedals normal pulses. No edema, no  varicosities. Abdomen:  Bowel sounds positive; abdomen soft and non-tender without masses, organomegaly, or hernias noted. No hepatosplenomegaly. Msk:  Back normal, normal gait. Muscle strength and tone normal. Pulses:  pulses normal in all 4 extremities Extremities:  No clubbing or cyanosis. Neurologic:  Alert and oriented x 3. Psych:  Normal affect.   Impression & Recommendations:  Problem # 1:  ATRIAL FIBRILLATION (ICD-427.31) Heart rate is well controlled at present on amioderone. Will need liver enzymes completed on next visit unless done with primary care during lipid evaluation. She is followed by Dr. Sherryll Burger for PT INR.  We checked it today with value of 2.0.  I have given her hemoccult cards to check herself monthly and present to Dr. Sherryll Burger should they become positive. Her updated medication list for this problem includes:    Aspirin 81 Mg Tabs (Aspirin) ..... One tab by mouth daily  Problem # 2:  ATHEROSCLEROTIC CARDIOVASCULAR DISEASE-CABG (ICD-429.2) Assessment: Unchanged  Problem # 3:  CHRONIC DIASTOLIC HEART FAILURE (ICD-428.32) She is well compensated on current medications. No changes at this time. Her updated medication list for this problem includes:    Torsemide 100 Mg Tabs (Torsemide) .Marland Kitchen... Take 1/2  tablet by mouth daily.    Aspirin 81 Mg Tabs (Aspirin) ..... One tab by mouth daily    Diltiazem Hcl Er Beads 360 Mg Xr24h-cap (Diltiazem hcl er beads) .Marland Kitchen... Take one capsule by mouth daily    Lisinopril 20 Mg Tabs (Lisinopril) .Marland Kitchen... Take one tablet by mouth daily  Other Orders: Hemoccult Cards (Take Home) (Hemoccult Cards)  Patient Instructions: 1)  Your physician recommends that you schedule a follow-up appointment in: 6 months 2)  Your physician recommends that you continue on your current medications as directed. Please refer to the Current Medication list given to you today. 3)  Your physician has asked that you test your stool for blood. It is necessary to test 3  different stool specimens for accuracy. You will be given 3 hemoccult cards for specimen collection. For each stool specimen, place a small portion of stool sample (from 2 different areas of the stool) into the 2 squares on the card. Close card. Repeat with 2 more stool specimens. Bring the cards back to the office for testing.***when completed please give to Dr. Clelia Croft

## 2010-05-13 NOTE — Assessment & Plan Note (Signed)
Summary: HOSP F/U, GASTRIC ULCERS...AS.   History of Present Illness Visit Type: Initial Visit Primary GI MD: Stan Head MD [facility] Primary Provider: Lanier Clam, MD Requesting Provider: Dr Fabienne Bruns Chief Complaint: Patient here for f/u after hospitalization for gastric ulcers. She states that she feels well at this time and has no gi complaints. History of Present Illness:   77 yo ww that was seen at [facility] in November. She had acute abdominal pain and a large area of proximal gastric ulceration that I thought was ischemic in origin, based upon appearance and biopsy results. She has a stenotic stent in celiac artery axis. she also had a thickened colon on CT suspicious for ischemia 9and did have hematochezia). There was a diagnosis of acute pancreatitis dyue to elevated enzymes but pancreas was ok on CT. She is well now without GI complaints. Daughter is with her. She is on pantoprazole.   GI Review of Systems      Denies abdominal pain, acid reflux, belching, bloating, chest pain, dysphagia with liquids, dysphagia with solids, heartburn, loss of appetite, nausea, vomiting, vomiting blood, weight loss, and  weight gain.      Reports diverticulosis.     Denies anal fissure, black tarry stools, change in bowel habit, constipation, diarrhea, fecal incontinence, heme positive stool, hemorrhoids, irritable bowel syndrome, jaundice, light color stool, liver problems, rectal bleeding, and  rectal pain. Preventive Screening-Counseling & Management  Alcohol-Tobacco     Smoking Status: never  Caffeine-Diet-Exercise     Does Patient Exercise: yes    Current Medications (verified): 1)  Torsemide 100 Mg Tabs (Torsemide) .... Take 1/2  Tablet By Mouth Daily. 2)  Pantoprazole Sodium 40 Mg Tbec (Pantoprazole Sodium) .... Take 1 Tablet By Mouth Once A Day 3)  Synthroid 50 Mcg Tabs (Levothyroxine Sodium) .Marland Kitchen.. 1 Tab By Mouth Daily 4)  Aspirin 81 Mg Tabs (Aspirin) .... One Tab By  Mouth Daily 5)  Coumadin 3 Mg Tabs (Warfarin Sodium) .... Take 1 Tablet Every Day Except For Tuesday, Thursday, Sunday, At Which Time Patient Takes 2 Tablets 6)  Colace 100 Mg Caps (Docusate Sodium) .... Take 1 Tablet By Mouth Two Times A Day 7)  Pravastatin Sodium 40 Mg Tabs (Pravastatin Sodium) .... Take One Tablet By Mouth Daily At Bedtime 8)  Klonopin 0.5 Mg Tabs (Clonazepam) .... Take 1 Tab By Mouth At Bedtime 9)  Diltiazem Hcl Er Beads 360 Mg Xr24h-Cap (Diltiazem Hcl Er Beads) .... Take One Capsule By Mouth Daily 10)  Tramadol Hcl 50 Mg Tabs (Tramadol Hcl) .... Take 1 Tab Am 11)  Lisinopril 20 Mg Tabs (Lisinopril) .... Take One Tablet By Mouth Daily  Allergies (verified): No Known Drug Allergies  Past History:  Past Medical History: ASCVD:CABGx3 LIMA- LAD, SVG- OM1 and OM 3, SVG-OM2 Paroxysmal atrial fibrillation; anticoagulation therapy PVD: Right SFA and ant. tibial embolectomy, endarterectomy and patch angioplasty in 10/09 for ischemic rt. 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 superior mesenteric artery requiring TNA;        GI bleed secondary to colonic ulcerations-12/06 Hypertension Diabetes Degenerative joint disease-Status post bilateral knee replacements Hypothyroidism Rheumatoid arthritis Afib GERD CAD DVT Gastric ulcer - acute and ischemia suspected 11/11 Ischemic colitiis by CT 11/11  Past Surgical History: Appendectomy Coronary artery bypass graft surgery Vascular surgery of the right lower extremity-2009 Right and left TKA BTL  Family History: Father died at age 14-suicide Mother died  at age 43 with neoplastic disease Siblings: sister has a history of coronary disease with CABG surgery and Alzheimer's Daughter died at age 51 due to neoplastic disease of the bone Positive FH of Diabetes, Hypertension   Family History of Ovarian Cancer: Mother No FH of Colon Cancer: Family History of  Diabetes: 2 children  Social History: Alcohol Use - no Drug Use - no Patient has never smoked.  Patient gets regular exercise. Does Patient Exercise:  yes  Vital Signs:  Patient profile:   77 year old female Height:      62 inches Weight:      173 pounds BMI:     31.76 BSA:     1.80 Pulse rate:   84 / minute Pulse rhythm:   regular BP sitting:   118 / 78  (left arm)  Vitals Entered By: Lamona Curl CMA Duncan Dull) (April 27, 2010 3:10 PM)  Physical Exam  General:  elderly NAD   Impression & Recommendations:  Problem # 1:  GASTRIC ULCER, ACUTE (ICD-531.30) Assessment Improved clinically better exact etiology not proven but balance of evidence suggests ischemic in origin I have recommended a repeat EGD late Jan/Feb to ensure healing and she will think about it. She understands why I have recommended as not qute clear about cause of ulcer, but is likely to forgo this which is not necessarily unreasonable. Risks, benefits,and indications of endoscopic procedure(s) were reviewed with the patient and all questions answered.  Daughter asked if she was on too many BP meds as she could have had a hypotensive insult leading to problems. I asked her to discuss with PCP/cardiologist.  Problem # 2:  MESENTERIC VASCULAR INSUFFICIENCY (ICD-557.1) Assessment: Improved 11//11 admission seems due to this she is a poor operative candidate and repeat celiac stenting is not an option Dr. Odis Luster is handling this patient and daughter are aware of limited options and high risk of surgery she seems ok for the time being, fortunately  Patient Instructions: 1)  Continue all of your medications.   2)  Call the office when you want to schedule your EGD. 3)  Copy sent to : Lanier Clam, MD Dr Fabienne Bruns 4)  The medication list was reviewed and reconciled.  All changed / newly prescribed medications were explained.  A complete medication list was provided to the patient / caregiver.

## 2010-05-13 NOTE — Letter (Signed)
Summary: [facility]  [facility]   Imported By: Sherian Rein 04/02/2010 07:43:58  _____________________________________________________________________  External Attachment:    Type:   Image     Comment:   External Document

## 2010-05-13 NOTE — Discharge Summary (Signed)
Summary: Discharge Summary    NAME:  [name], [name]               ACCOUNT NO.:  [id number]      MEDICAL RECORD NO.:  [id number]          PATIENT TYPE:  INP      LOCATION:  4710                         FACILITY:  [facility]      PHYSICIAN:  Calvert Cantor, M.D.     DATE OF BIRTH:  Feb 26, 1930      DATE OF ADMISSION:  03/06/2010   DATE OF DISCHARGE:  03/11/2010                                  DISCHARGE SUMMARY         PRIMARY CARE PHYSICIAN:  Dr. Sherryll Burger in [place].      PRESENTING COMPLAINT:  Abdominal pain, nausea, vomiting blood, and   bright red blood per rectum      DISCHARGE DIAGNOSES:   1. Acute pancreatitis.   2. Upper and lower gastrointestinal bleed.   3. 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   4. Atrial fibrillation on Coumadin.   5. Chronic diastolic heart failure.   6. Hypertension.   7. Gastroesophageal reflux disease.   8. Diabetes mellitus.   9. Right leg deep venous thrombosis in 2009.   10.Hypothyroidism.   11.Degenerative joint disease.   12.Coronary artery disease status post coronary artery bypass       grafting.   13.Peripheral vascular disease.      DISCHARGE MEDICATIONS:  New Medications:  Pantoprazole 40 mg daily.      Stop the following medications:   1. Aspirin, this can be restarted in 3-4 days.   2. Warfarin, this should be restarted on March 20, 2010.   3. Prilosec.      Continue the following home medications:   1. Clonazepam 0.5 mg daily at bedtime.   2. Colace 100 mg twice a day.   3. Diltiazem CD 300 mg daily.   4. Lisinopril 20 mg daily.   5. Levothyroxine 50 mcg daily.   6. Metformin 500 mg daily.   7. Pravachol 40 mg daily.   8. Tramadol 50 mg q.8 h. as needed.   9. Torsemide 20 mg daily.      CONSULTS:   1. GI consult with Dr. Loreta Ave.  The patient was later followed by Dr.       Leone Payor.   2. Vascular Surgery consult with Dr. Imogene Burn.      PERTINENT RADIOLOGICAL IMAGING:  CT angiogram of the abdomen  and pelvis   on March 08, 2010, revealed multifocal colon wall thickening, which   was suspicious for ischemia.  There was re-demonstration of celiac stent   with good opacification of the delayed distal celiac access and branch   vessels.  There was a similar occlusion of the proximal superior   mesenteric artery with reconstitution via collaterals.  There was   similar infrarenal abdominal aortic aneurysm.  Free pelvic fluid was   present.  A non-acute right inferior pubic ramus fracture was noted when   compared with imaging performed on October 29, 2008.      HOSPITAL COURSE:  This is a 77 year old female  who was admitted to   [facility] on March 06, 2010, with a complaint of abdominal   pain and was noted to have slightly elevated lipase and amylase.   Subsequently developed hematemesis and bright red blood per rectum while   in the hospital and was transferred to [facility] for further   evaluation.  Please see H and P from March 07, 2010, dictated by Dr.   Onalee Hua for further details.      The patient was noted to have an INR of 1.7 and was given 10 mg of   vitamin K prior to transfer from [place].  She was started on an IV   Protonix drip at [facility].  A GI consult was requested.  She wasevaluated by Dr. Loreta Ave and underwent an EGD on March 08, 2010.  This   revealed multiple ulcers in the body of the stomach, which were   biopsied.  She had a large area of acute ulceration with significant   exudate and satellite ulcer surrounding the large ulcer.  Some of the   exudate removed reveals heaped up violaceous mucosa.  It was suspected   this was ischemic in origin.  Erythema was found at the gastroesophageal   junction.      A colonoscopy was not performed.      A Vascular Surgery consult was requested.  She was evaluated by Dr.   Imogene Burn.  A CTA was ordered as results mentioned above.  It was noted that the celiac stent was patent.  According to notes from    Vascular Surgery, she   is not a candidate for redo endovascular procedure and a marginal   candidate for open bypass due to underlying frail health.  They may   reassess her if she deteriorates once again.      The patient was subsequently started on a diet by Dr. Leone Payor and has   tolerated this quite well.  She has not bled in the past 3 days.  She   did not require any FFP or packed red blood cell transfusion.  She has   been started on Protonix.      Upon discussion with Dr. Leone Payor, the patient can resume her Coumadin 2   weeks from the initial bleeding episode, which would be around March 20, 2010.  I also recommend that she continues to hold her aspirin and   restart this in about 3-4 days.      Regarding her acute pancreatitis, looking back at her labs from [facility],   I see that her lipase was elevated to 112 on March 06, 2010.  By the   time, she was transferred, it had improved to 20.      PHYSICAL EXAMINATION:  ABDOMEN:  Soft, nontender, nondistended.  Bowel   sounds positive.   LUNGS:  Clear bilaterally within normal respiratory effort.   HEART:  Irregular rate and rhythm.  She is in AFib with a heart rate   about 70s and 80s on the monitor.   EXTREMITIES:  No cyanosis, clubbing, or edema.      FOLLOWUP INSTRUCTIONS:   1. She is to follow up with Dr. Leone Payor.  She has been given an       appointment for April 27, 2009.   2. She is to follow up with her primary care physician in 1-2 weeks.      Time on discharge was 55 minutes.  Calvert Cantor, M.D.               SR/MEDQ  D:  03/11/2010  T:  03/12/2010  Job:  [id number]      cc:   Dr. Sherryll Burger in [facility]      Electronically Signed by Calvert Cantor M.D. on 03/17/2010 10:19:03 PM

## 2010-05-13 NOTE — Letter (Signed)
Summary: New Patient letter  [facility]  [address] [place], [place] [address]   Phone: [phone]  Fax: [phone]       04/09/2010 MRN: [id number]  [name] [address] [place], [place]  [address]  [place]  Dear Ms. Sheeran,  Welcome to the Gastroenterology Division at [facility].    You are scheduled to see Dr.  Leone Payor on 04-27-2010 at 2:45pm on the 3rd floor at [facility], [address]  We ask that you try to arrive at our office 15 minutes prior to your appointment time to allow for check-in.  We would like you to complete the enclosed self-administered evaluation form prior to your visit and bring it with you on the day of your appointment.  We will review it with you.  Also, please bring a complete list of all your medications or, if you prefer, bring the medication bottles and we will list them.  Please bring your insurance card so that we may make a copy of it.  If your insurance requires a referral to see a specialist, please bring your referral form from your primary care physician.  Co-payments are due at the time of your visit and may be paid by cash, check or credit card.     Your office visit will consist of a consult with your physician (includes a physical exam), any laboratory testing he/she may order, scheduling of any necessary diagnostic testing (e.g. x-ray, ultrasound, CT-scan), and scheduling of a procedure (e.g. Endoscopy, Colonoscopy) if required.  Please allow enough time on your schedule to allow for any/all of these possibilities.    If you cannot keep your appointment, please call [phone] to cancel or reschedule prior to your appointment date.  This allows [place] the opportunity to schedule an appointment for another patient in need of care.  If you do not cancel or reschedule by 5 p.m. the business day prior to your appointment date, you will be charged a $50.00 late cancellation/no-show fee.    Thank you for  choosing [facility] for your medical needs.  We appreciate the opportunity to care for you.  Please visit [place] at our website  to learn more about our practice.                     Sincerely,                                                             The Gastroenterology Division

## 2010-05-19 NOTE — Procedures (Signed)
Summary: [facility] Endoscopy Procedure Report  [facility] Endoscopy Procedure Report   Imported By: Earl Many 05/13/2010 15:49:44  _____________________________________________________________________  External Attachment:    Type:   Image     Comment:   External Document

## 2010-06-22 LAB — CBC
HCT: 38.3 % (ref 36.0–46.0)
Hemoglobin: 11.8 g/dL — ABNORMAL LOW (ref 12.0–15.0)
Hemoglobin: 12.8 g/dL (ref 12.0–15.0)
MCH: 28.8 pg (ref 26.0–34.0)
MCH: 29.1 pg (ref 26.0–34.0)
MCH: 29.1 pg (ref 26.0–34.0)
MCH: 29.2 pg (ref 26.0–34.0)
MCHC: 33.4 g/dL (ref 30.0–36.0)
MCHC: 33.6 g/dL (ref 30.0–36.0)
MCHC: 33.6 g/dL (ref 30.0–36.0)
MCHC: 33.9 g/dL (ref 30.0–36.0)
MCV: 86.2 fL (ref 78.0–100.0)
MCV: 86.5 fL (ref 78.0–100.0)
Platelets: 239 10*3/uL (ref 150–400)
Platelets: 244 10*3/uL (ref 150–400)
Platelets: 251 10*3/uL (ref 150–400)
Platelets: 256 10*3/uL (ref 150–400)
Platelets: 259 10*3/uL (ref 150–400)
RBC: 3.62 MIL/uL — ABNORMAL LOW (ref 3.87–5.11)
RBC: 3.81 MIL/uL — ABNORMAL LOW (ref 3.87–5.11)
RBC: 3.97 MIL/uL (ref 3.87–5.11)
RBC: 4.23 MIL/uL (ref 3.87–5.11)
RDW: 14.5 % (ref 11.5–15.5)
RDW: 14.5 % (ref 11.5–15.5)
RDW: 14.5 % (ref 11.5–15.5)
RDW: 14.9 % (ref 11.5–15.5)
RDW: 15 % (ref 11.5–15.5)
WBC: 12.8 10*3/uL — ABNORMAL HIGH (ref 4.0–10.5)
WBC: 14.1 10*3/uL — ABNORMAL HIGH (ref 4.0–10.5)
WBC: 8.5 10*3/uL (ref 4.0–10.5)
WBC: 8.6 10*3/uL (ref 4.0–10.5)

## 2010-06-22 LAB — GLUCOSE, CAPILLARY
Glucose-Capillary: 105 mg/dL — ABNORMAL HIGH (ref 70–99)
Glucose-Capillary: 109 mg/dL — ABNORMAL HIGH (ref 70–99)
Glucose-Capillary: 112 mg/dL — ABNORMAL HIGH (ref 70–99)
Glucose-Capillary: 119 mg/dL — ABNORMAL HIGH (ref 70–99)
Glucose-Capillary: 138 mg/dL — ABNORMAL HIGH (ref 70–99)
Glucose-Capillary: 144 mg/dL — ABNORMAL HIGH (ref 70–99)
Glucose-Capillary: 161 mg/dL — ABNORMAL HIGH (ref 70–99)
Glucose-Capillary: 176 mg/dL — ABNORMAL HIGH (ref 70–99)
Glucose-Capillary: 75 mg/dL (ref 70–99)

## 2010-06-22 LAB — CULTURE, BLOOD (ROUTINE X 2): Culture  Setup Time: 201111271256

## 2010-06-22 LAB — URINALYSIS, MICROSCOPIC ONLY
Protein, ur: 30 mg/dL — AB
Urobilinogen, UA: 0.2 mg/dL (ref 0.0–1.0)

## 2010-06-22 LAB — COMPREHENSIVE METABOLIC PANEL
ALT: 19 U/L (ref 0–35)
AST: 17 U/L (ref 0–37)
Albumin: 2.5 g/dL — ABNORMAL LOW (ref 3.5–5.2)
CO2: 22 mEq/L (ref 19–32)
Calcium: 8.3 mg/dL — ABNORMAL LOW (ref 8.4–10.5)
Calcium: 8.6 mg/dL (ref 8.4–10.5)
Creatinine, Ser: 1.03 mg/dL (ref 0.4–1.2)
Creatinine, Ser: 1.5 mg/dL — ABNORMAL HIGH (ref 0.4–1.2)
GFR calc Af Amer: 41 mL/min — ABNORMAL LOW (ref >60)
GFR calc Af Amer: >60 mL/min (ref >60)
GFR calc non Af Amer: 34 mL/min — ABNORMAL LOW (ref >60)
Glucose, Bld: 140 mg/dL — ABNORMAL HIGH (ref 70–99)
Sodium: 139 mEq/L (ref 135–145)
Total Protein: 6.6 g/dL (ref 6.0–8.3)
Total Protein: 6.8 g/dL (ref 6.0–8.3)

## 2010-06-22 LAB — APTT: aPTT: 24 seconds (ref 24–37)

## 2010-06-22 LAB — CARDIAC PANEL(CRET KIN+CKTOT+MB+TROPI)
CK, MB: 2 ng/mL (ref 0.3–4.0)
Relative Index: INVALID (ref 0.0–2.5)
Total CK: 61 U/L (ref 7–177)
Troponin I: 0.04 ng/mL (ref 0.00–0.06)
Troponin I: 0.04 ng/mL (ref 0.00–0.06)

## 2010-06-22 LAB — BASIC METABOLIC PANEL
BUN: 4 mg/dL — ABNORMAL LOW (ref 6–23)
CO2: 22 mEq/L (ref 19–32)
CO2: 24 mEq/L (ref 19–32)
Calcium: 8.5 mg/dL (ref 8.4–10.5)
Chloride: 111 mEq/L (ref 96–112)
Chloride: 113 mEq/L — ABNORMAL HIGH (ref 96–112)
Creatinine, Ser: 0.99 mg/dL (ref 0.4–1.2)
Creatinine, Ser: 1.18 mg/dL (ref 0.4–1.2)
Creatinine, Ser: 1.33 mg/dL — ABNORMAL HIGH (ref 0.4–1.2)
GFR calc Af Amer: 47 mL/min — ABNORMAL LOW (ref >60)
GFR calc Af Amer: 54 mL/min — ABNORMAL LOW (ref >60)
Glucose, Bld: 104 mg/dL — ABNORMAL HIGH (ref 70–99)
Glucose, Bld: 141 mg/dL — ABNORMAL HIGH (ref 70–99)

## 2010-06-22 LAB — PROTIME-INR
INR: 1.37 (ref 0.00–1.49)
INR: 1.67 — ABNORMAL HIGH (ref 0.00–1.49)
INR: 2.09 — ABNORMAL HIGH (ref 0.00–1.49)
INR: 2.41 — ABNORMAL HIGH (ref 0.00–1.49)
Prothrombin Time: 17.1 seconds — ABNORMAL HIGH (ref 11.6–15.2)
Prothrombin Time: 23.6 seconds — ABNORMAL HIGH (ref 11.6–15.2)
Prothrombin Time: 26.4 seconds — ABNORMAL HIGH (ref 11.6–15.2)

## 2010-06-22 LAB — LACTIC ACID, PLASMA: Lactic Acid, Venous: 2.7 mmol/L — ABNORMAL HIGH (ref 0.5–2.2)

## 2010-06-22 LAB — LIPASE, BLOOD: Lipase: 20 U/L (ref 11–59)

## 2010-06-22 LAB — DIFFERENTIAL
Lymphocytes Relative: 10 % — ABNORMAL LOW (ref 12–46)
Lymphs Abs: 1.4 10*3/uL (ref 0.7–4.0)
Monocytes Relative: 9 % (ref 3–12)
Neutro Abs: 11 10*3/uL — ABNORMAL HIGH (ref 1.7–7.7)
Neutrophils Relative %: 81 % — ABNORMAL HIGH (ref 43–77)

## 2010-06-22 LAB — URINE CULTURE

## 2010-06-22 LAB — MRSA PCR SCREENING: MRSA by PCR: NEGATIVE

## 2010-06-22 LAB — MAGNESIUM
Magnesium: 2 mg/dL (ref 1.5–2.5)
Magnesium: 2 mg/dL (ref 1.5–2.5)

## 2010-06-22 LAB — TSH: TSH: 0.64 u[IU]/mL (ref 0.350–4.500)

## 2010-06-22 LAB — PHOSPHORUS: Phosphorus: 3.5 mg/dL (ref 2.3–4.6)

## 2010-06-30 LAB — GLUCOSE, CAPILLARY
Glucose-Capillary: 100 mg/dL — ABNORMAL HIGH (ref 70–99)
Glucose-Capillary: 100 mg/dL — ABNORMAL HIGH (ref 70–99)
Glucose-Capillary: 104 mg/dL — ABNORMAL HIGH (ref 70–99)
Glucose-Capillary: 127 mg/dL — ABNORMAL HIGH (ref 70–99)
Glucose-Capillary: 128 mg/dL — ABNORMAL HIGH (ref 70–99)
Glucose-Capillary: 94 mg/dL (ref 70–99)
Glucose-Capillary: 98 mg/dL (ref 70–99)

## 2010-06-30 LAB — TSH: TSH: 15.706 u[IU]/mL — ABNORMAL HIGH (ref 0.350–4.500)

## 2010-06-30 LAB — CBC
HCT: 31.8 % — ABNORMAL LOW (ref 36.0–46.0)
HCT: 37.5 % (ref 36.0–46.0)
Hemoglobin: 10.9 g/dL — ABNORMAL LOW (ref 12.0–15.0)
Hemoglobin: 12.6 g/dL (ref 12.0–15.0)
MCV: 91.4 fL (ref 78.0–100.0)
RBC: 3.48 MIL/uL — ABNORMAL LOW (ref 3.87–5.11)
RBC: 4.01 MIL/uL (ref 3.87–5.11)
RDW: 14.2 % (ref 11.5–15.5)
WBC: 5.7 10*3/uL (ref 4.0–10.5)
WBC: 6.3 10*3/uL (ref 4.0–10.5)

## 2010-06-30 LAB — POCT I-STAT, CHEM 8
BUN: 26 mg/dL — ABNORMAL HIGH (ref 6–23)
Calcium, Ion: 1.07 mmol/L — ABNORMAL LOW (ref 1.12–1.32)
Chloride: 107 meq/L (ref 96–112)
Creatinine, Ser: 1.5 mg/dL — ABNORMAL HIGH (ref 0.4–1.2)
Glucose, Bld: 81 mg/dL (ref 70–99)
HCT: 37 % (ref 36.0–46.0)
Hemoglobin: 12.6 g/dL (ref 12.0–15.0)
Potassium: 4.9 meq/L (ref 3.5–5.1)
Sodium: 140 mEq/L (ref 135–145)
TCO2: 27 mmol/L (ref 0–100)

## 2010-06-30 LAB — CARDIAC PANEL(CRET KIN+CKTOT+MB+TROPI)
CK, MB: 1.4 ng/mL (ref 0.3–4.0)
CK, MB: 1.8 ng/mL (ref 0.3–4.0)
CK, MB: 2.6 ng/mL (ref 0.3–4.0)
Relative Index: 2 (ref 0.0–2.5)
Relative Index: INVALID (ref 0.0–2.5)
Total CK: 70 U/L (ref 7–177)
Troponin I: 0.01 ng/mL (ref 0.00–0.06)
Troponin I: 0.03 ng/mL (ref 0.00–0.06)

## 2010-06-30 LAB — PROTIME-INR
INR: 2.17 — ABNORMAL HIGH (ref 0.00–1.49)
Prothrombin Time: 24 seconds — ABNORMAL HIGH (ref 11.6–15.2)

## 2010-06-30 LAB — URINE CULTURE
Colony Count: NO GROWTH
Culture: NO GROWTH

## 2010-06-30 LAB — TROPONIN I: Troponin I: 0.03 ng/mL (ref 0.00–0.06)

## 2010-06-30 LAB — BASIC METABOLIC PANEL
BUN: 33 mg/dL — ABNORMAL HIGH (ref 6–23)
CO2: 26 mEq/L (ref 19–32)
Chloride: 103 mEq/L (ref 96–112)
GFR calc non Af Amer: 23 mL/min — ABNORMAL LOW (ref >60)
Glucose, Bld: 97 mg/dL (ref 70–99)
Potassium: 4.5 mEq/L (ref 3.5–5.1)
Sodium: 139 mEq/L (ref 135–145)

## 2010-06-30 LAB — POCT CARDIAC MARKERS
CKMB, poc: 1.8 ng/mL (ref 1.0–8.0)
Myoglobin, poc: 105 ng/mL (ref 12–200)
Troponin i, poc: <0.05 ng/mL (ref 0.00–0.09)

## 2010-06-30 LAB — COMPREHENSIVE METABOLIC PANEL
BUN: 26 mg/dL — ABNORMAL HIGH (ref 6–23)
CO2: 26 mEq/L (ref 19–32)
Chloride: 101 mEq/L (ref 96–112)
Creatinine, Ser: 1.85 mg/dL — ABNORMAL HIGH (ref 0.4–1.2)
GFR calc non Af Amer: 26 mL/min — ABNORMAL LOW (ref >60)
Glucose, Bld: 122 mg/dL — ABNORMAL HIGH (ref 70–99)
Total Bilirubin: 0.6 mg/dL (ref 0.3–1.2)

## 2010-06-30 LAB — URINALYSIS, ROUTINE W REFLEX MICROSCOPIC
Hgb urine dipstick: NEGATIVE
Protein, ur: NEGATIVE mg/dL
Urobilinogen, UA: 0.2 mg/dL (ref 0.0–1.0)

## 2010-06-30 LAB — CK TOTAL AND CKMB (NOT AT ARMC): CK, MB: 1.3 ng/mL (ref 0.3–4.0)

## 2010-06-30 LAB — DIFFERENTIAL
Eosinophils Relative: 4 % (ref 0–5)
Lymphocytes Relative: 36 % (ref 12–46)
Lymphs Abs: 2.3 10*3/uL (ref 0.7–4.0)
Monocytes Absolute: 0.7 10*3/uL (ref 0.1–1.0)
Monocytes Relative: 11 % (ref 3–12)
Neutro Abs: 3.1 10*3/uL (ref 1.7–7.7)

## 2010-06-30 LAB — BRAIN NATRIURETIC PEPTIDE: Pro B Natriuretic peptide (BNP): 128 pg/mL — ABNORMAL HIGH (ref 0.0–100.0)

## 2010-06-30 LAB — URINE MICROSCOPIC-ADD ON

## 2010-06-30 LAB — APTT: aPTT: 33 seconds (ref 24–37)

## 2010-07-16 ENCOUNTER — Other Ambulatory Visit: Payer: Self-pay | Admitting: *Deleted

## 2010-07-16 MED ORDER — DILTIAZEM HCL 240 MG PO CP24: 240.0000 mg | ORAL_CAPSULE | Freq: Every day | ORAL | Status: DC

## 2010-07-19 LAB — GLUCOSE, CAPILLARY
Glucose-Capillary: 101 mg/dL — ABNORMAL HIGH (ref 70–99)
Glucose-Capillary: 101 mg/dL — ABNORMAL HIGH (ref 70–99)
Glucose-Capillary: 103 mg/dL — ABNORMAL HIGH (ref 70–99)
Glucose-Capillary: 178 mg/dL — ABNORMAL HIGH (ref 70–99)
Glucose-Capillary: 98 mg/dL (ref 70–99)

## 2010-07-19 LAB — CBC
Hemoglobin: 11.1 g/dL — ABNORMAL LOW (ref 12.0–15.0)
MCHC: 33.9 g/dL (ref 30.0–36.0)
MCV: 84.9 fL (ref 78.0–100.0)
RBC: 3.87 MIL/uL (ref 3.87–5.11)
WBC: 7.5 10*3/uL (ref 4.0–10.5)

## 2010-07-19 LAB — APTT: aPTT: 168 seconds — ABNORMAL HIGH (ref 24–37)

## 2010-07-19 LAB — HEPARIN LEVEL (UNFRACTIONATED): Heparin Unfractionated: 0.55 IU/mL (ref 0.30–0.70)

## 2010-07-19 LAB — BASIC METABOLIC PANEL
CO2: 27 mEq/L (ref 19–32)
Chloride: 107 mEq/L (ref 96–112)
Creatinine, Ser: 1.13 mg/dL (ref 0.4–1.2)
GFR calc Af Amer: 57 mL/min — ABNORMAL LOW (ref >60)
Potassium: 4.4 mEq/L (ref 3.5–5.1)
Sodium: 141 mEq/L (ref 135–145)

## 2010-07-19 LAB — PROTIME-INR
Prothrombin Time: 29.1 seconds — ABNORMAL HIGH (ref 11.6–15.2)
Prothrombin Time: 32.8 seconds — ABNORMAL HIGH (ref 11.6–15.2)

## 2010-07-20 LAB — GLUCOSE, CAPILLARY
Glucose-Capillary: 108 mg/dL — ABNORMAL HIGH (ref 70–99)
Glucose-Capillary: 120 mg/dL — ABNORMAL HIGH (ref 70–99)
Glucose-Capillary: 123 mg/dL — ABNORMAL HIGH (ref 70–99)
Glucose-Capillary: 125 mg/dL — ABNORMAL HIGH (ref 70–99)
Glucose-Capillary: 139 mg/dL — ABNORMAL HIGH (ref 70–99)
Glucose-Capillary: 147 mg/dL — ABNORMAL HIGH (ref 70–99)
Glucose-Capillary: 163 mg/dL — ABNORMAL HIGH (ref 70–99)
Glucose-Capillary: 164 mg/dL — ABNORMAL HIGH (ref 70–99)
Glucose-Capillary: 80 mg/dL (ref 70–99)
Glucose-Capillary: 92 mg/dL (ref 70–99)
Glucose-Capillary: 92 mg/dL (ref 70–99)
Glucose-Capillary: 93 mg/dL (ref 70–99)
Glucose-Capillary: 99 mg/dL (ref 70–99)
Glucose-Capillary: 99 mg/dL (ref 70–99)

## 2010-07-20 LAB — DIFFERENTIAL
Basophils Absolute: 0 10*3/uL (ref 0.0–0.1)
Basophils Relative: 0 % (ref 0–1)
Eosinophils Absolute: 0.2 10*3/uL (ref 0.0–0.7)
Eosinophils Relative: 2 % (ref 0–5)
Monocytes Absolute: 0.9 10*3/uL (ref 0.1–1.0)
Neutro Abs: 6.8 10*3/uL (ref 1.7–7.7)

## 2010-07-20 LAB — CBC
HCT: 26.8 % — ABNORMAL LOW (ref 36.0–46.0)
HCT: 27.4 % — ABNORMAL LOW (ref 36.0–46.0)
HCT: 28.1 % — ABNORMAL LOW (ref 36.0–46.0)
HCT: 30 % — ABNORMAL LOW (ref 36.0–46.0)
HCT: 30.5 % — ABNORMAL LOW (ref 36.0–46.0)
Hemoglobin: 10.1 g/dL — ABNORMAL LOW (ref 12.0–15.0)
Hemoglobin: 10.3 g/dL — ABNORMAL LOW (ref 12.0–15.0)
Hemoglobin: 9.3 g/dL — ABNORMAL LOW (ref 12.0–15.0)
Hemoglobin: 9.4 g/dL — ABNORMAL LOW (ref 12.0–15.0)
MCHC: 33.4 g/dL (ref 30.0–36.0)
MCHC: 33.5 g/dL (ref 30.0–36.0)
MCHC: 33.7 g/dL (ref 30.0–36.0)
MCHC: 33.7 g/dL (ref 30.0–36.0)
MCHC: 33.8 g/dL (ref 30.0–36.0)
MCHC: 33.8 g/dL (ref 30.0–36.0)
MCHC: 34 g/dL (ref 30.0–36.0)
MCV: 83.1 fL (ref 78.0–100.0)
MCV: 83.2 fL (ref 78.0–100.0)
MCV: 84.4 fL (ref 78.0–100.0)
MCV: 84.9 fL (ref 78.0–100.0)
MCV: 85 fL (ref 78.0–100.0)
MCV: 85 fL (ref 78.0–100.0)
Platelets: 254 10*3/uL (ref 150–400)
Platelets: 255 10*3/uL (ref 150–400)
Platelets: 323 10*3/uL (ref 150–400)
Platelets: 361 10*3/uL (ref 150–400)
RBC: 3.16 MIL/uL — ABNORMAL LOW (ref 3.87–5.11)
RBC: 3.53 MIL/uL — ABNORMAL LOW (ref 3.87–5.11)
RBC: 3.67 MIL/uL — ABNORMAL LOW (ref 3.87–5.11)
RBC: 3.8 MIL/uL — ABNORMAL LOW (ref 3.87–5.11)
RDW: 15.9 % — ABNORMAL HIGH (ref 11.5–15.5)
RDW: 15.9 % — ABNORMAL HIGH (ref 11.5–15.5)
RDW: 16.5 % — ABNORMAL HIGH (ref 11.5–15.5)
RDW: 16.5 % — ABNORMAL HIGH (ref 11.5–15.5)
WBC: 6.4 10*3/uL (ref 4.0–10.5)
WBC: 7.1 10*3/uL (ref 4.0–10.5)

## 2010-07-20 LAB — PREPARE FRESH FROZEN PLASMA

## 2010-07-20 LAB — COMPREHENSIVE METABOLIC PANEL
AST: 60 U/L — ABNORMAL HIGH (ref 0–37)
Albumin: 2.2 g/dL — ABNORMAL LOW (ref 3.5–5.2)
Alkaline Phosphatase: 80 U/L (ref 39–117)
BUN: 12 mg/dL (ref 6–23)
Chloride: 104 mEq/L (ref 96–112)
Potassium: 3.7 mEq/L (ref 3.5–5.1)
Total Bilirubin: 0.4 mg/dL (ref 0.3–1.2)

## 2010-07-20 LAB — CROSSMATCH: Antibody Screen: NEGATIVE

## 2010-07-20 LAB — URINALYSIS, ROUTINE W REFLEX MICROSCOPIC
Glucose, UA: NEGATIVE mg/dL
Ketones, ur: NEGATIVE mg/dL
Leukocytes, UA: NEGATIVE
Nitrite: NEGATIVE
Protein, ur: 30 mg/dL — AB

## 2010-07-20 LAB — BASIC METABOLIC PANEL
BUN: 10 mg/dL (ref 6–23)
BUN: 16 mg/dL (ref 6–23)
BUN: 16 mg/dL (ref 6–23)
BUN: 17 mg/dL (ref 6–23)
CO2: 26 mEq/L (ref 19–32)
CO2: 27 mEq/L (ref 19–32)
CO2: 28 mEq/L (ref 19–32)
Calcium: 8.1 mg/dL — ABNORMAL LOW (ref 8.4–10.5)
Calcium: 8.8 mg/dL (ref 8.4–10.5)
Chloride: 102 mEq/L (ref 96–112)
Chloride: 103 mEq/L (ref 96–112)
Chloride: 104 mEq/L (ref 96–112)
Chloride: 104 mEq/L (ref 96–112)
Creatinine, Ser: 1.08 mg/dL (ref 0.4–1.2)
Creatinine, Ser: 1.09 mg/dL (ref 0.4–1.2)
Creatinine, Ser: 1.11 mg/dL (ref 0.4–1.2)
GFR calc Af Amer: 58 mL/min — ABNORMAL LOW (ref >60)
GFR calc Af Amer: 60 mL/min — ABNORMAL LOW (ref >60)
GFR calc non Af Amer: 48 mL/min — ABNORMAL LOW (ref >60)
GFR calc non Af Amer: 50 mL/min — ABNORMAL LOW (ref >60)
Glucose, Bld: 106 mg/dL — ABNORMAL HIGH (ref 70–99)
Glucose, Bld: 109 mg/dL — ABNORMAL HIGH (ref 70–99)
Potassium: 3.3 mEq/L — ABNORMAL LOW (ref 3.5–5.1)
Potassium: 4.3 mEq/L (ref 3.5–5.1)
Sodium: 139 mEq/L (ref 135–145)
Sodium: 141 mEq/L (ref 135–145)

## 2010-07-20 LAB — PROTIME-INR
INR: 1.2 (ref 0.00–1.49)
INR: 1.5 (ref 0.00–1.49)
Prothrombin Time: 15.4 seconds — ABNORMAL HIGH (ref 11.6–15.2)
Prothrombin Time: 16.2 seconds — ABNORMAL HIGH (ref 11.6–15.2)
Prothrombin Time: 18.8 seconds — ABNORMAL HIGH (ref 11.6–15.2)

## 2010-07-20 LAB — URINE MICROSCOPIC-ADD ON

## 2010-07-20 LAB — HEPARIN LEVEL (UNFRACTIONATED)
Heparin Unfractionated: 0.21 IU/mL — ABNORMAL LOW (ref 0.30–0.70)
Heparin Unfractionated: 0.39 IU/mL (ref 0.30–0.70)
Heparin Unfractionated: 0.41 IU/mL (ref 0.30–0.70)
Heparin Unfractionated: 0.45 IU/mL (ref 0.30–0.70)
Heparin Unfractionated: <0.1 IU/mL — ABNORMAL LOW (ref 0.30–0.70)

## 2010-07-20 LAB — POTASSIUM: Potassium: 4.4 mEq/L (ref 3.5–5.1)

## 2010-07-20 LAB — HEMOCCULT GUIAC POC 1CARD (OFFICE): Fecal Occult Bld: POSITIVE

## 2010-07-26 LAB — CARDIAC PANEL(CRET KIN+CKTOT+MB+TROPI)
CK, MB: 0.9 ng/mL (ref 0.3–4.0)
Relative Index: INVALID (ref 0.0–2.5)
Total CK: 18 U/L (ref 7–177)
Total CK: 22 U/L (ref 7–177)
Total CK: 25 U/L (ref 7–177)
Troponin I: 0.01 ng/mL (ref 0.00–0.06)

## 2010-07-26 LAB — URINE CULTURE

## 2010-07-26 LAB — URINALYSIS, ROUTINE W REFLEX MICROSCOPIC
Hgb urine dipstick: NEGATIVE
Leukocytes, UA: NEGATIVE
Protein, ur: 100 mg/dL — AB
Urobilinogen, UA: 0.2 mg/dL (ref 0.0–1.0)

## 2010-07-26 LAB — COMPREHENSIVE METABOLIC PANEL
ALT: 11 U/L (ref 0–35)
AST: 23 U/L (ref 0–37)
Albumin: 2.6 g/dL — ABNORMAL LOW (ref 3.5–5.2)
Alkaline Phosphatase: 81 U/L (ref 39–117)
GFR calc Af Amer: >60 mL/min (ref >60)
Glucose, Bld: 88 mg/dL (ref 70–99)
Potassium: 4 mEq/L (ref 3.5–5.1)
Sodium: 136 mEq/L (ref 135–145)
Total Protein: 7.1 g/dL (ref 6.0–8.3)

## 2010-07-26 LAB — BASIC METABOLIC PANEL
CO2: 30 mEq/L (ref 19–32)
Chloride: 101 mEq/L (ref 96–112)
Chloride: 103 mEq/L (ref 96–112)
GFR calc Af Amer: >60 mL/min (ref >60)
GFR calc non Af Amer: >60 mL/min (ref >60)
Glucose, Bld: 154 mg/dL — ABNORMAL HIGH (ref 70–99)
Potassium: 3.3 mEq/L — ABNORMAL LOW (ref 3.5–5.1)
Potassium: 3.6 mEq/L (ref 3.5–5.1)
Sodium: 138 mEq/L (ref 135–145)

## 2010-07-26 LAB — DIFFERENTIAL
Basophils Relative: 1 % (ref 0–1)
Eosinophils Absolute: 0.1 10*3/uL (ref 0.0–0.7)
Eosinophils Relative: 2 % (ref 0–5)
Monocytes Absolute: 0.6 10*3/uL (ref 0.1–1.0)
Monocytes Relative: 9 % (ref 3–12)
Neutrophils Relative %: 48 % (ref 43–77)

## 2010-07-26 LAB — GLUCOSE, CAPILLARY
Glucose-Capillary: 105 mg/dL — ABNORMAL HIGH (ref 70–99)
Glucose-Capillary: 106 mg/dL — ABNORMAL HIGH (ref 70–99)
Glucose-Capillary: 118 mg/dL — ABNORMAL HIGH (ref 70–99)
Glucose-Capillary: 122 mg/dL — ABNORMAL HIGH (ref 70–99)
Glucose-Capillary: 157 mg/dL — ABNORMAL HIGH (ref 70–99)
Glucose-Capillary: 97 mg/dL (ref 70–99)

## 2010-07-26 LAB — CK TOTAL AND CKMB (NOT AT ARMC)
Relative Index: INVALID (ref 0.0–2.5)
Total CK: 29 U/L (ref 7–177)

## 2010-07-26 LAB — PROTIME-INR
INR: 2 — ABNORMAL HIGH (ref 0.00–1.49)
INR: 2.1 — ABNORMAL HIGH (ref 0.00–1.49)
Prothrombin Time: 23.7 seconds — ABNORMAL HIGH (ref 11.6–15.2)

## 2010-07-26 LAB — URINE MICROSCOPIC-ADD ON

## 2010-07-26 LAB — POCT CARDIAC MARKERS
CKMB, poc: 1.3 ng/mL (ref 1.0–8.0)
Myoglobin, poc: 67.3 ng/mL (ref 12–200)
Troponin i, poc: <0.05 ng/mL (ref 0.00–0.09)

## 2010-07-26 LAB — TSH: TSH: 7.135 u[IU]/mL — ABNORMAL HIGH (ref 0.350–4.500)

## 2010-07-26 LAB — CBC
Hemoglobin: 10.5 g/dL — ABNORMAL LOW (ref 12.0–15.0)
RDW: 15.1 % (ref 11.5–15.5)

## 2010-08-24 NOTE — Assessment & Plan Note (Signed)
OFFICE VISIT   TORINA, EY  DOB:  May 23, 1929                                       12/10/2009  AVWUJ#:[id number]   HISTORY OF PRESENT ILLNESS:  The patient is a 77 year old female who  returns for follow-up today.  She previously has undergone embolectomy  of her right leg in 2009.  In 2010 she had a celiac artery stent placed  for chronic mesenteric ischemia.  As far as her leg is concerned she has  been ambulating well with no symptoms of claudication or rest pain.  She  has had no nonhealing ulcers on her feet.   She has been gaining weight since her celiac stent was placed in 2010.  She denies any postprandial abdominal pain.  She has been eating well  and gaining weight.   CHRONIC MEDICAL PROBLEMS:  Hypertension, atrial fibrillation both of  which are currently controlled.   PHYSICAL EXAM:  Blood pressure 150/92 in the left arm, heart rate 106  and regular, oxygen saturation is 97%.  HEENT:  Unremarkable.  Neck:  Has 2+ carotid pulses without bruit.  Chest:  Clear to auscultation.  Cardiac exam is regular rate and rhythm.  Abdomen is soft, nontender,  nondistended.  No bruits.  Extremities:  She has 2+ femoral pulses  bilaterally with absent popliteal and pedal pulses.  Feet are pink, warm  and well-perfused.  Musculoskeletal:  Exam shows no major joint  deformities.  Neurologic exam shows symmetric upper extremity and lower  extremity motor strength.  Skin has no open ulcers or rashes.   She had a mesenteric duplex scan today which showed her celiac stent may  have had some recurrent stenosis, but this is unchanged from January  2011.  She did have a small abdominal aortic aneurysm of 3 cm.  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   She is currently on warfarin anticoagulation as well as aspirin which  should also be protective for her celiac stent.   In summary, the patient has a patent celiac artery stent and has no  symptoms of chronic mesenteric ischemia at this point.  Her lower  extremities have done well after embolectomy.  Overall she seems to be  doing well and is as healthy appearing today as I have seen her in quite  some time.  She will follow up with [place] with a mesenteric duplex scan in  1 years' time.     [name]. Fields, MD  Electronically Signed   CEF/MEDQ  D:  12/10/2009  T:  12/11/2009  Job:  [id number]   cc:   Barbaraann Share, MD,FCCP  Erasmo Downer, MD  Everardo Beals. Juanda Chance, MD, [facility]

## 2010-08-24 NOTE — Op Note (Signed)
[name], [name]               ACCOUNT NO.:  [id number]   MEDICAL RECORD NO.:  [id number]          PATIENT TYPE:  INP   LOCATION:  2306                         FACILITY:  [facility]   PHYSICIAN:  Kerin Perna, M.D.  DATE OF BIRTH:  08-09-1929   DATE OF PROCEDURE:  01/29/2008  DATE OF DISCHARGE:                               OPERATIVE REPORT   OPERATION:  1. Coronary artery bypass grafting x3 (left internal mammary artery to      left anterior descending, saphenous vein graft to first obtuse      marginal artery, saphenous vein graft to distal circumflex).  2. Endoscopic harvest of the right leg greater saphenous vein.   PREOPERATIVE DIAGNOSIS:  A 90% critical left main stenosis with unstable  angina.   POSTOPERATIVE DIAGNOSIS:  A 90% critical left main stenosis with  unstable angina.   SURGEON:  Kerin Perna, MD   ASSISTANT:  Rowe Clack, PA-C   ANESTHESIA:  General by Bedelia Person, MD   INDICATIONS:  The patient is a 77 year old female diabetic who presents  after being admitted to an hospital with upper abdominal and lower chest  pain and EKG abnormalities.  She had several risk factors for coronary  artery disease and underwent a diagnostic cardiac cath by Dr. Charlies Constable.  This demonstrated a 90-95% left main stenosis with heavy  calcification and proximal stenosis of the obtuse marginal branch of the  circumflex.  She also was noted to have a 90-95% stenosis of the right  renal artery, and a previous CT angiogram showed chronic occlusion of  the superior mesenteric artery with reconstitution via collaterals.  Because of her severe coronary artery disease and unstable angina,  surgical revascularization was recommended.   Prior to surgery, I examined the patient in her CCU room and reviewed  results of the cardiac cath with the patient and family.  I discussed  the indications and expected benefits of coronary artery bypass surgery  for treatment of her coronary  artery disease.  I reviewed the  alternatives to surgical therapy as well.  I discussed with her the  major issues of the operation including the choice of conduit to include  internal mammary artery and endoscopically harvested saphenous vein, the  location of the surgical incisions, the use of general anesthesia and  cardiopulmonary bypass, and the expected postoperative hospital  recovery.  She understood the risks to her of coronary artery bypass  surgery including the risks of stroke, MI, bleeding, blood transfusion  requirement, infection, and death.  After reviewing these issues, she  demonstrated her understanding and agreed to proceed with the surgery  and what I felt was an informed consent.   OPERATIVE FINDINGS:  1. Severe left main and left dominant coronary artery disease      corrected with CABG x3 with good conduit.  2. Intramyocardial LAD.  3. Preoperative anemia requiring 2 units of packed cells and post pump      platelet - FFP transfusions.   PROCEDURE:  The patient was brought to the operating room, placed supine  on the  operating table where general anesthesia was induced.  The chest,  abdomen, and legs were prepped with Betadine and draped as a sterile  field.  A sternal incision was made.  The saphenous vein was harvested  endoscopically from the right leg.  The left internal mammary artery was  harvested as a pedicle graft from it and was good vessel with excellent  flow.  A sternal retractor was placed in the pericardium, was opened and  suspended.  Heparin was administered and the ACT was documented as being  therapeutic.  The vein was harvested and inspected and found to be  adequate.  Purse-strings were then placed in the ascending aorta and  right atrium.  The patient was cannulated and placed on bypass.  The  coronaries were identified for grafting.  Then, the mammary artery and  vein grafts were prepared for the distal anastomoses.  Cardioplegia   catheters were placed for both antegrade aortic and retrograde coronary  sinus cardioplegia.  The patient was cooled to 32 degrees and aortic  cross-clamp was applied.  An 800 mL of cold blood cardioplegia was  delivered in split doses between the antegrade aortic and retrograde  coronary sinus cardioplegia.  There was good cardioplegic arrest and  septal temperature dropped less than 14 degrees.  Cardioplegia was then  dosed every 20 minutes or less while the cross-clamp was applied.   The distal coronary anastomoses were then performed.  The first distal  anastomosis was the distal circumflex.  This was a 1.5-mm vessel with  proximal high-grade left main stenosis.  A reverse saphenous vein was  sewn using running 7-0 Prolene in end-to-side fashion and there was good  flow through the graft.  The second distal anastomosis was the first OM  or obtuse marginal branch of the circumflex.  This was a 1.7-mm vessel  with proximal high-grade left main stenosis.  Reverse saphenous vein of  a larger caliber was sewn end-to-side with running 7-0 Prolene, good  flow through the graft.  Cardioplegia was redosed.  The third distal  anastomosis was the distal aspect of the LAD.  It was intramyocardial  and was a 1.5- to 1.6-mm vessel.  The left IMA pedicle was brought  through an opening created in the left lateral pericardium, was brought  down on the LAD and sewn end-to-side with running 8-0 Prolene.  There  was good flow through the anastomosis after briefly releasing the  pedicle with bulldog and the mammary pedicle.  The bulldog was  reapplied, and the pedicle was secured epicardium with 6-0 Prolene.  Cardioplegia was redosed.   While the cross-clamp was still in place, 2 proximal vein anastomoses  were performed on the ascending aorta using a 4.5-mm punch and running 7-  0 Prolene.  Prior to tying down the final proximal anastomosis, air was  vented from the coronaries with a dose of retrograde  warm blood  cardioplegia.  The proximal anastomosis was tied and the cross-clamp was  removed.   The heart resumed a spontaneous rhythm.  Air was aspirated from the vein  grafts with a 27-gauge needle.  Each graft was checked and found to have  good flow and hemostasis was documented to the proximal distal  anastomoses.  The cardioplegia catheters were removed.  Temporary pacing  wires were applied.  The lungs re-expanded and the ventilator was  resumed.  The patient reached 37 degrees and then was weaned from bypass  without difficulty.  She was AV sequentially paced  with a stable blood  pressure and cardiac output greater than 4 L/min.  Protamine was  administered without adverse reaction.  The cannula was removed.  The  mediastinum was irrigated with warm antibiotic irrigation.  The leg  incision was irrigated and closed in a standard fashion.  The superior  pericardial fat was closed over the aorta.  Two mediastinal and left  pleural chest tubes were placed and brought out through  separate incisions.  The sternum was closed with interrupted steel wire.  The pectoralis fascia was closed in running #1 Vicryl.  The subcutaneous  and skin layers were closed in running Vicryl and sterile dressings were  applied.  Total bypass time was 90 minutes.  The cross-clamp time was 62  minutes.      Kerin Perna, M.D.  Electronically Signed     PV/MEDQ  D:  01/29/2008  T:  01/30/2008  Job:  [id number]   cc:   Everardo Beals. Juanda Chance, MD, [facility]  Erasmo Downer, MD  [facility] Cardiology

## 2010-08-24 NOTE — H&P (Signed)
NAMEFLORIDA, NOLTON               ACCOUNT NO.:  [id number]   MEDICAL RECORD NO.:  [id number]          PATIENT TYPE:  INP   LOCATION:  2927                         FACILITY:  [facility]   PHYSICIAN:  Madolyn Frieze. [name], MD, FACCDATE OF BIRTH:  October 03, 1929   DATE OF ADMISSION:  04/28/2008  DATE OF DISCHARGE:                              HISTORY & PHYSICAL   PRIMARY CARDIOLOGIST:  Learta Codding, MD,FACC   PRIMARY MEDICAL DOCTOR:  Erasmo Downer, MD   CHIEF COMPLAINT:  Dyspnea.   HISTORY OF PRESENT ILLNESS:  The patient reports worsening dyspnea,  orthopnea, and lower extremity edema starting 3 weeks ago.  The patient  estimates that her weight has increased by approximately 15 pounds in  that time.  She has had nocturia and orthopnea but no paroxysmal  nocturnal dyspnea.  She denies chest pain, palpitations, presyncopal or  syncopal episodes, fevers, chills, or change in her chronic dry cough.   PAST MEDICAL HISTORY:  1. Coronary artery disease, status post cath in October 2009 showing      90% narrowing of the ostium of the left main coronary artery, 40%      narrowing of the proximal LAD, 80% narrowing in the second marginal      branch of the circumflex artery, and 50% narrowing in the small      nondominant right coronary artery.  The patient is status post CABG      in October 2009 with LIMA to the LAD and SVG to 3 obtuse marginal      branches.  2. Renal artery stenosis (95%).  3. Hypertension.  4. Chronic atrial fibrillation.  5. Rheumatoid arthritis.  6. Hypothyroidism.  7. Diabetes mellitus.  8. Hyperlipidemia.  9. Status post bilateral knee replacement.  10.Status post laparoscopic appendectomy.  11.She is status post fasciotomy secondary to compartment syndrome.  12.Status post right superficial femoral artery and anterior tibial      embolectomy, endarterectomy of the tibioperoneal trunk and patch      angioplasty of the popliteal and tibioperoneal trunk on February 05, 2008, for ischemic right foot.  13.History of GI bleed secondary to colonic ulcerations, December      2006.   SOCIAL HISTORY:  The patient lives in [place] alone.  She is retired.  She does not smoke.  She does not drink alcohol or ever use illicit  drugs.  She takes no herbal medications.  She has a regular diet with no  regular exercise.   FAMILY HISTORY:  Mother and father noncontributory.  Has a sister living  with CAD.   REVIEW OF SYSTEMS:  As described in HPI, all other systems were reviewed  and were negative.   ALLERGIES:  The patient has no known drug allergies.   MEDICATIONS:  1. Amiodarone 200 mg daily.  2. Norvasc 10 mg daily.  3. Aspirin 81 mg daily.  4. Lasix 20 mg daily.  5. Omeprazole 20 mg daily.  6. Simvastatin 20 mg daily.  7. Synthroid 50 mcg daily.  8. Coumadin as directed.  9. Clonidine 0.2 mg  b.i.d.  10.Colace 100 mg b.i.d.  11.Lopressor 25 mg b.i.d.  12.Hydralazine 25 mg t.i.d.  13.Ambien 5 mg daily.  14.NovoLog insulin, sliding scale as needed hyperglycemia.   PHYSICAL EXAMINATION:  VITAL SIGNS:  Temperature 97.7 degrees  Fahrenheit, BP 156/67, pulse was 92, respiration rate 16, and O2  saturation 95% on room air.  GENERAL:  The patient was alert and oriented x3, in no apparent  distress, resting comfortably with the head of bed at 45 degrees.  She  spoke quickly and moved fairly easily without respiratory distress  during exam.  HEENT:  Her head was normocephalic and atraumatic.  Pupils were equal,  round, and reactive to light.  Nares were patent without discharge.  Oropharynx was without erythema or exudates.  NECK:  Supple without lymphadenopathy or thyromegaly.  No bruits;  however, she did have 8-10 cm of JVD.  HEART:  Heart rate was regular with audible S1 and S2.  No murmurs,  rubs, clicks, or gallops.  Pulses were 2+ and equal in upper extremities  and 1+ and equal in lower extremities.  LUNGS:  Clear to auscultation  bilaterally.  Decreased breath sounds at  the bases.  SKIN:  No rashes, lesions, or petechiae.  ABDOMEN:  Soft, nontender.  Normal abdominal bowel sounds.  No rebound  or guarding.  No hepatosplenomegaly.  EXTREMITIES:  No clubbing or cyanosis with 2+ pitting edema bilaterally  in the lower extremities.  MUSCULOSKELETAL SYSTEM:  No joint deformity or effusions.  No spinal or  CVA tenderness.  NEUROLOGIC:  Cranial nerves II through XII are grossly intact.  Strength  is 5/5 in all extremities and axial groups.  Normal sensation  throughout.  Normal cerebellar function.   1. The patient had chest x-ray that showed stable, elevated left      hemidiaphragm and adjacent left basilar atelectasis or scarring.  2. Stable right mild basilar scarring, cardiomegaly, and changes of      COPD or chronic bronchitis.  3. No significant change in small bilateral effusions.   EKG showed sinus bradycardia with a rate of 53 beats per minute.  No  acute ST-T wave changes.  No Q-waves.  Normal axis.  No evidence of  hypertrophy.  PR interval 198, QRS 102, and QTc 439.  No significant  changes from EKG performed on February 07, 2008.   LABORATORY DATA:  WBC 7.0, HGB 10.5, HCT 31.8, and PLT count 295.  Sodium 136, potassium 4.0, chloride 102, CO2 of 26, BUN 7, creatinine  0.62, and glucose 88.  BNP 501.  First set of cardiac markers were  negative.  CK-MB 1.3 and troponin I less than 0.05.   ASSESSMENT AND PLAN:  This is a 77 year old female with a past medical  history of diabetes mellitus, hypertension, hyperlipidemia, coronary  artery disease, status post recent coronary artery bypass graft,  paroxysmal atrial fibrillation, hypothyroidism, history of  gastrointestinal bleed presenting with complaints of worsening dyspnea  x3 weeks.  The patient had coronary artery bypass graft on January 19, 2008, and it was noted when she was admitted that she was in atrial  fibrillation with ST depression.  The  patient's postoperative course  complicated by embolic event to right lower extremity, had embolectomy  and endarterectomy (subsequently developed compartment syndrome  requiring fasciotomy).  Did well until past 3 weeks when she complained  of progressive dyspnea on exertion, orthopnea, and increase in pedal  edema; no chest pain, productive cough, fevers, and chills.  Physical  exam was significant for jugular venous distention and bilateral lower  extremity edema.  Electrocardiogram showed sinus bradycardia with  nonspecific ST changes.  Chest x-ray showed chronic obstructive  pulmonary disease, small bilateral effusions.  BUN and creatinine within  normal limits.  Presentation most consistent with volume  excess/congestive heart failure.  We will admit for intravenous diuresis  (Lasix 40 mg intravenous b.i.d.); follow renal function, follow BMP.  Doubt this is a pulmonary embolus due to recent Coumadin use; however,  we will check a D-dimer, we will check an echocardiogram to compare or  to search for any significant changes.  At this time, her history and  presentation are not consistent with infection or amiodarone toxicity.  The patient will be continued on her home medications for her other  comorbidities.      Jarrett Ables, PAC      Madolyn Frieze. [name], MD, [facility]  Electronically Signed    MS/MEDQ  D:  04/28/2008  T:  04/29/2008  Job:  [id number]

## 2010-08-24 NOTE — Assessment & Plan Note (Signed)
[facility]                          [facility] NOTE   PATCHES, MCDONNELL                      MRN:          [id number]  DATE:08/29/2008                            DOB:          August 01, 1929    PRIMARY CARDIOLOGIST:  Learta Codding, MD, [facility]   REASON FOR VISIT:  Scheduled clinic followup.   Since Ms. [name] was last seen here in February of this year, she was  briefly hospitalized here at [facility] with nausea, vomiting, and  abdominal pain.  In fact, she was discharged just this morning, by Dr.  Sherryll Burger, and was not referred to [place] in consultation.  Medication  adjustments consisted of continued antibiotic therapy, as well as  cessation of clonidine.   Ms. Metivier states that she is feeling somewhat better.  From a cardiac  perspective, she denies any chest pain, significant dyspnea, or tachy  palpitations.   An EKG on admission, Aug 26, 2008, indicated marked sinus bradycardia at  38 bpm.  EKG in our office, however, is interpreted as sinus tachycardia  at 121, but is most likely atrial flutter with 2:1 conduction.  The  patient does have history of atrial fibrillation.   CURRENT MEDICATIONS:  1. Torsemide 20 b.i.d.  2. Synthroid 0.05 daily.  3. Lopressor 25 b.i.d.  4. Amiodarone 200 daily.  5. Norvasc 10 daily.  6. Aspirin 81 daily.  7. Coumadin 3 mg, as directed.  8. Pravachol 40 at bedtime.  9. Lisinopril 20 daily.  10.Metformin 5 mg daily.  11.Klonopin 0.5 at bedtime.   PHYSICAL EXAMINATION:  VITAL SIGNS:  Blood pressure 153/89, pulse 116  and regular, weight 132 (down 7).  GENERAL:  A 77 year old female sitting upright in no distress.  HEENT:  Normocephalic, atraumatic.  NECK:  Palpable carotid pulses with bilateral bruits; no JVD.  LUNGS:  Diminished breath sounds, with faint crackles.  No wheezes.  HEART:  Irregularly irregular.  No significant murmur.  EXTREMITIES:  Chronic bilateral 2 to 3+ lower extremity edema.  NEURO:   Alert and oriented.   IMPRESSION:  1. Recurrent supraventricular tachycardia.      a.     Suspect atrial flutter with 2:1 conduction.      b.     History of paroxysmal atrial fibrillation.      c.     Chronic Coumadin.  2. Status post gastroenteritis.  3. Multivessel coronary artery disease, quiescent.      a.     Status post three-vessel CABG, October 2009, secondary to       critical left main disease.      b.     Normal left ventricular function.  4. Severe peripheral arterial disease.      a.     Multiple mesenteric artery occlusions.      b.     95% right renal artery stenosis.      c.     60-79% bilateral internal carotid artery stenosis.  5. Chronic diastolic heart failure.      a.     Question some component of restrictive heart disease.  6. Hypertension.  7. Hypothyroidism.  8. History of gastrointestinal bleed in 2006.  9. Type 2 diabetes mellitus.  10.Pulmonary hypertension.  11.Chronic lower extremity edema.   PLAN:  Following review of today's EKG with Dr. Andee Lineman, plan is as  follows:  1. Refer to our electrophysiology team in [place] for further      evaluation of probable paroxysmal atrial fibrillation with tachy-      brady syndrome.  Her recent EKG on Aug 26, 2008, clearly indicates      marked sinus bradycardia at 38 bpm.  She has known history of      atrial fibrillation.  The EKG in our office today suggests atrial      flutter with 2:1 conduction.  Of note, the patient is on Coumadin,      followed by her primary medical team.  Dr. Andee Lineman is concerned that      if he were to shock this rhythm that she might develop profound      bradycardia.  Therefore, we feel that she presents with clear      indication for permanent pacemaker implantation, but we will defer      to our EP team for their recommendations.  2. In the meanwhile, up titrate Lopressor to 25 t.i.d. for rate      control.  It is possible that the patient was taken off her rate      and  rhythm controlling medications during this brief      hospitalization.  She is to, therefore, resume all of her cardiac      medications, including amiodarone, at their previous doses, with      the exception of today's increase in her beta-blocker.  We will      then have her return to the clinic in 1 week for continued close      monitoring and a repeat EKG.  3. The patient is strongly advised to keep her scheduled followup with      Dr. Fabienne Bruns in [place], as planned.  She is due for a 6-      month return visit with him.  I have also      instructed her to confer with him with respect to her previously      documented bilateral carotid artery disease, during her      preoperative evaluation in October of last year.      Gene Serpe, PA-C  Electronically Signed      Learta Codding, MD,FACC  Electronically Signed   GS/MedQ  DD: 08/29/2008  DT: 08/30/2008  Job #: [id number]   cc:   Kirstie Peri, MD

## 2010-08-24 NOTE — Discharge Summary (Signed)
[name], [name]               ACCOUNT NO.:  [id number]   MEDICAL RECORD NO.:  [id number]          PATIENT TYPE:  INP   LOCATION:  3742                         FACILITY:  [facility]   PHYSICIAN:  Madolyn Frieze. [name], MD, FACCDATE OF BIRTH:  Oct 07, 1929   DATE OF ADMISSION:  04/28/2008  DATE OF DISCHARGE:  04/30/2008                               DISCHARGE SUMMARY   PRIMARY CARDIOLOGIST:  Learta Codding, MD,FACC.   PRIMARY CARE PHYSICIAN:  Dr. Sherril Croon in [place].   DISCHARGE DIAGNOSIS:  Acute diastolic congestive heart failure.   SECONDARY DIAGNOSES:  1. Coronary artery disease status post coronary artery bypass graft x3      January 28, 2008, LIMA to the LAD, vein graft to the obtuse      marginal, vein graft to the left circumflex.  2. Hypertension.  3. Chronic atrial fibrillation on chronic Coumadin anticoagulation.  4. Hyperlipidemia.  5. Type 2 diabetes mellitus.  6. Hypothyroidism.  7. Renal artery stenosis.  8. Peripheral vascular disease status post right superficial femoral      artery anterior tibial artery embolectomy as well as endarterectomy      of the tibial peroneal trunk and patch angioplasty of the popliteal      and tibial peroneal trunk February 05, 2008, secondary to ischemic      right foot.  9. Status post fasciotomy secondary to compartment syndrome.  10.History of gastrointestinal bleed secondary to colonic ulcerations      December 2006.  11.Rheumatoid arthritis.  12.Status post bilateral knee replacement.  13.Status post laparoscopic appendectomy.   ALLERGIES:  No known drug allergies.   PROCEDURES:  CT angiography of the chest performed April 29, 2008,  revealing no evidence of acute pulmonary embolism.  There were bilateral  pleural effusions.   HISTORY OF PRESENT ILLNESS:  A 77 year old Caucasian female with the  above problem list.  She is recently status post CABG in October 2009.  Postoperative course was complicated by ischemic right foot  requiring  embolectomy and endarterectomy and subsequently compartment syndrome  requiring fasciotomy.  The patient was in her usual state of health  approximately 3 weeks when she began to note increasing lower extremity  edema, progressive dyspnea on exertion and orthopnea.  She presented to  the [facility] ED April 20, 2008, where ECG showed no acute changes.  Cardiac markers were negative and BNP was elevated at 501.  She was felt  to be volume overloaded.  She was admitted for further evaluation and  management of acute presumably diastolic congestive heart failure.   HOSPITAL COURSE:  The patient was placed on intravenous Lasix and  responded well with brisk diuresis.  A D-dimer was performed and was  elevated at 11.25.  CT angiography of the chest was negative for  pulmonary embolism.  She was maintained on Coumadin therapy throughout  her hospitalization and was also therapeutic.  She has had marked  symptomatic improvement and will plan to discharge her home today.  We  have increased her home Lasix dose from 20-40 mg daily and have also  added supplemental potassium.  We have discovered a urinary tract  infection with her urine culture growing out greater than 100,000  colonies of Citrobacter koseri which is sensitive to ciprofloxacin.  She  will be treated with a short course of ciprofloxacin.  We have arranged  for an outpatient 2-D echocardiogram on January 29 at 9:30 a.m. at  [facility].  She will subsequently follow up with Dr. Andee Lineman.   DISCHARGE LABS:  Hemoglobin 10.5, hematocrit 31.8, WBC 7.8, platelets  295, INR 2.0.  Sodium 139, potassium 3.3, (replaced prior to discharge),  chloride 101, CO2 30, BUN 9, creatinine 0.75, glucose 101, total  bilirubin 0.9, alkaline phosphatase 81, AST 23, ALT 11, total protein  7.1, albumin 2.6, calcium 8.7, CK 22, MB 0.9, troponin-I 0.01.  TSH  7.135.  Urine culture showing greater than 100,000 colonies of  Citrobacter  koseri.   DISPOSITION:  The patient will be discharged home today in good  condition.   FOLLOWUP PLANS AND APPOINTMENTS:  As above, the patient will undergo a 2-  D echocardiogram January 29 at 9:30 a.m. at [facility]  She  will follow up with Dr. Andee Lineman on February 8 at 2:45 p.m.  We have asked  her to have a blood chemistry and INR check on Monday, January 22.  She  has her Coumadin followed at Dr. Bonnita Levan office.  We have also asked her  to follow up with Dr. Sherril Croon with regards to her mildly elevated TSH with  a question of sick euthyroid versus under treated hypothyroidism.   DISCHARGE MEDICATIONS:  1. Lasix 40 mg daily.  2. Cipro 250 mg b.i.d. x3 days.  3. K-Dur 20 mEq daily.  4. Coumadin 3 mg q.h.s.  5. Amiodarone 2 mg daily.  6. Norvasc 10 mg daily.  7. Simvastatin 20 mg q.h.s.  8. Synthroid 50 mcg daily.  9. Clonidine 0.2 mg b.i.d.  10.Lopressor 25 mg b.i.d.  11.Hydralazine 25 mg t.i.d.  12.NovoLog insulin as previously prescribed.  13.Aspirin 81 mg daily.  14.Omeprazole 20 mg daily.  15.Ambien 5 mg q.h.s. p.r.n.  16.Nitroglycerin 0.4 mg sublingual for chest pain.   OUTSTANDING LAB STUDIES:  1. Followup BMET and INR January 22.  2. Followup echocardiogram January 29.   DURATION DISCHARGE ENCOUNTER:  Sixty minutes including physician time.      Nicolasa Ducking, ANP      Madolyn Frieze. [name], MD, [facility]  Electronically Signed    CB/MEDQ  D:  04/30/2008  T:  04/30/2008  Job:  [id number]   cc:   Dr Sherril Croon

## 2010-08-24 NOTE — Assessment & Plan Note (Signed)
[facility]                                 ON-CALL NOTE   AMEN, DARGIS                      MRN:          [id number]  DATE:05/31/2008                            DOB:          1929/06/15    PRIMARY CARDIOLOGIST:  Learta Codding, MD,FACC   I received a phone call from nurse caring for [facility].  The  nurse's name was Elpidio Anis call and she noted that the patient had  gained approximately 10 pounds over the last 10 days with 3-pound weight  gain over the last 24 hours.  The patient was seen by Dr. Andee Lineman  approximately 1 week ago and her meds were changed from Lasix 20 mg  daily to torsemide 20 mg daily.  The patient's caregiver stated that she  was worried that the patient would be fluid overloaded again if this was  not reported.   I have advised the patient's caregiver to increase her torsemide to 40  mg in the morning and at night x1 day and also to increase her potassium  to 40 mg twice a day.  She is to weigh the patient again in the morning  and if the patient is not diuresed, she is to bring the patient to the  emergency room for admission for CHF, that is resistant to p.o. Demadex.  The patient is denying any shortness of breath or chest pain; however,  her caregiver is monitoring her weight closely and I have advised her to  bring the patient to the emergency room should her condition  deteriorates or call EMS.  The patient's caregiver verbalized  understanding and the patient will be reweighed in the morning unless  her condition changes further worse.     [name]. Lyman Bishop, NP  Electronically Signed    KML/MedQ  DD: 05/31/2008  DT: 06/01/2008  Job #: [id number]

## 2010-08-24 NOTE — Op Note (Signed)
NAMEARIADNA, SETTER               ACCOUNT NO.:  [id number]   MEDICAL RECORD NO.:  [id number]          PATIENT TYPE:  INP   LOCATION:  2028                         FACILITY:  [facility]   PHYSICIAN:  Juleen China IV, MDDATE OF BIRTH:  January 01, 1930   DATE OF PROCEDURE:  09/02/2008  DATE OF DISCHARGE:                               OPERATIVE REPORT   PREOPERATIVE DIAGNOSIS:  Mesenteric ischemia.   POSTOPERATIVE DIAGNOSIS:  Mesenteric ischemia.   PROCEDURE PERFORMED:  1. Ultrasound access, left femoral artery.  2. Abdominal aortogram.  3. First order catheterization (celiac artery).  4. Celiac artery angiogram.  5. Celiac artery stent.   INDICATIONS:  This is a 77 year old female who presented to the hospital  with worsening postprandial abdominal pain.  She underwent a CT  angiogram, which shows a high-grade celiac stenosis and an occluded  superior mesenteric artery.  She comes in today for arteriogram with  possible intervention.   PROCEDURE:  The patient was identified in the holding area and taken to  room 8.  She was placed supine on the table.  The left groin was prepped  and draped in a standard sterile fashion.  A time-out was called.  Lidocaine 1% was used for local anesthesia.  The left femoral artery was  accessed under ultrasound guidance using an 18-gauge needle.  An 0.035  Bentson wire was advanced into the aorta under fluoroscopic  visualization.  A 5-French sheath was placed.  Omni flush catheter was  placed at the level of L1 and abdominal aortogram was obtained in AP and  lateral projections.   FINDINGS:  Aortogram:  The visualized portions of the suprarenal  abdominal aorta showed minimal disease.  There was a high-grade stenosis  at the origin of the celiac artery.  The superior mesenteric artery is  occluded with early reconstitution.  The inferior mesenteric artery is  patent.  The infrarenal abdominal aorta is aneurysmal and heavily  calcified.   The above  images were obtained, decision was made to intervene, a 6-  [place] sheath was placed.  The celiac artery was selected using a Cobra-  1 catheter and a 0.14 stabilizer wire.  The catheter was then advanced  into the celiac artery and additional images of the celiac access were  obtained.  The celiac artery was predilated using 4 x 2 Aviator balloon.  Next, this was done using a renal double curve guide cath.  The patient  was also heparinized during this part of the procedure.  A Palmaz Blue  6 x 15 stent was advanced out across the stenosis.  It was deployed two  thirds into the celiac access, one-third into the aorta.  Balloon was  taken to 12 atmospheres.  Followup angiogram was performed, which showed  significantly improved flow though the celiac artery with early  reconstitution of the superior mesenteric artery.  After these images  were obtained, the decision was made to terminate the procedure.  Catheters and wires were removed.  The patient will be taken to holding  area for sheath pull once her coagulation profile corrects.   IMPRESSION:  1. High-grade celiac artery stenosis, successfully treated using a      Palmaz Blue 6 x 15 balloon expandable stent.  2. Superior mesenteric artery occlusion.      Jorge Ny, MD  Electronically Signed     VWB/MEDQ  D:  09/02/2008  T:  09/03/2008  Job:  [id number]

## 2010-08-24 NOTE — Assessment & Plan Note (Signed)
OFFICE VISIT   [name], [name]  DOB:  10/14/1929                                       03/12/2008  EAVWU#:[id number]   The patient returns for follow-up today for wound check of her right leg  fasciotomy.  She previously underwent right superficial femoral,  anterior tibial and popliteal thrombectomies on October 27th.  She had  an extended hospital course which also consisted of an episode of  mesenteric ischemia and has significant mesenteric occlusive disease  which we are managing conservatively at this point.  She states she has  been walking but drags her left foot somewhat and is a little bit clumsy  with this.  She is having physical therapy to work on this.  She denies  any abdominal pain, weight loss or postprandial abdominal pain.   PHYSICAL EXAMINATION:  Today, blood pressure is 158/78 in the left arm,  heart rate 66 and regular.  Abdomen:  Soft, nontender, nondistended.  Extremities:  She has a healing lateral fasciotomy wound on the right  leg which has some hypergranulation tissue.  This was treated with  silver nitrate today.  She has bilateral 2+ dorsalis pedis pulses.  Also, lower extremity pitting edema.  She also has a history of atrial  fibrillation and is on Coumadin.   Overall, the patient's wounds in her legs continue to heal.  The lower  extremity edema is most likely secondary to cardiogenic sources since  this is bilaterally.  She has follow-up scheduled with Dr. Juanda Chance  tomorrow and this will be addressed at that time.   As far as her mesenteric ischemia is concerned,  I would continue to  manage this conservatively since she is currently asymptomatic.  If she  develops symptoms of pain postprandially or weight loss then we might  need to consider treating her mesenteric occlusive disease at some point  in the future.  However, she is still very deconditioned at this point  and I believe the best option is to manage her  conservatively if  possible.   She will continue her Coumadin therapy for her atrial fibrillation for  prevention of formation of further embolic material.  She will follow up  with me in 1 month's time to re-examine her fasciotomy wound.  The  patient's weight today was 140 pounds.   [name]. Fields, MD  Electronically Signed   CEF/MEDQ  D:  03/12/2008  T:  03/13/2008  Job:  [id number]   cc:   Erasmo Downer, MD  Everardo Beals. Juanda Chance, MD, [facility]  Kerin Perna, M.D.

## 2010-08-24 NOTE — Procedures (Signed)
MESENTERIC ARTERIAL DUPLEX EVALUATION   INDICATION:  Followup evaluation, status post celiac stent on Sep 02, 2008.  The patient has a known superior mesenteric artery occlusion.   HISTORY:  Diabetes:  No  Cardiac:  Coronary artery bypass graft and congestive heart failure.  Hypertension:  Yes  Smoking:  No   Mesenteric Duplex Findings:  Aorta - Proximal                            81  Aorta - Mid                                 78  Aorta - Distal                              107   Celiac Trunk - Proximal                     689  Celiac Trunk - Distal                       441   Hepatic Artery                              91  Splenic Artery                              140   Superior Mesenteric Artery-Origin           73  Superior Mesenteric Artery-Proximal         58  Superior Mesenteric Artery-Mid  Superior Mesenteric Artery-Distal   Inferior Mesenteric Artery-Proximal         Not seen.   IMPRESSION:  Elevated velocities and celiac origin suggest in stent  restenosis.  Proximal portion of superior mesenteric artery identified however distal  vessel appears to be occluded.      ___________________________________________  [name]. Fields, MD   MC/MEDQ  D:  10/22/2008  T:  10/22/2008  Job:  [id number]

## 2010-08-24 NOTE — Procedures (Signed)
MESENTERIC ARTERIAL DUPLEX EVALUATION   INDICATION:  Followup celiac artery stent placed on 09/02/2008, known  proximal SMA occlusion.   HISTORY:  Diabetes:  No.  Cardiac:  CABG, CHF.  Hypertension:  Yes.  Smoking:  No.   Mesenteric Duplex Findings:  Aorta - Proximal                            56  Aorta - Mid                                 62  Aorta - Distal                              42   Celiac Trunk - Proximal                     234/72  Celiac Trunk - Distal                       295/95   Hepatic Artery                              178  Splenic Artery                              148   Superior Mesenteric Artery-Origin           Occluded  Superior Mesenteric Artery-Proximal         114  Superior Mesenteric Artery-Mid              70  Superior Mesenteric Artery-Distal           62   Inferior Mesenteric Artery-Proximal         Not visualized   IMPRESSION:  1. Patent celiac artery stent with Doppler velocities that are      suggestive of a >75% stenosis.  2. Known occlusion of the superior mesenteric artery origin with      reconstitution of flow distally.  3. The inferior mesenteric artery was not adequately visualized.  4. Small aneurysmal dilatation of the distal abdominal aorta which      measures 3.0 x 3.0 cm.  5. No significant change noted when compared to the previous exam on      05/06/2009.   ___________________________________________  [name]. Fields, MD   CH/MEDQ  D:  12/10/2009  T:  12/10/2009  Job:  [id number]

## 2010-08-24 NOTE — Assessment & Plan Note (Signed)
OFFICE VISIT   [name], [name]  DOB:  May 08, 1929                                       02/27/2008  EAVWU#:[id number]   The patient returns for followup today after fasciotomies and  embolectomy of her right anterior tibial artery and superficial femoral  artery on October 27.  She was sent to a skilled nursing facility with a  VAC in place.  She presents today for further evaluation of the wound.   On exam today the fasciotomy has almost completely epithelialized to a  level surface.  We discontinued the [facility] today.  This should continue to  epithelialize and grow over as new skin soon.  As far as her mesenteric  ischemia is concerned she did have evidence of mesenteric ischemia and  ischemic colitis in the hospital.  She states that she has been eating  well without pain.  She also states that she is gaining weight.  Weight  today was 136 pounds.  Hopefully she will continue to remain  asymptomatic from that.  She will follow up with me in one month's time  to see if her fasciotomy has completely healed.  We will change her from  a VAC dressing to hydrogel once daily dressings today.   [name]. Fields, MD  Electronically Signed   CEF/MEDQ  D:  02/27/2008  T:  02/28/2008  Job:  [id number]

## 2010-08-24 NOTE — Consult Note (Signed)
NAMESAMAYAH, NOVINGER               ACCOUNT NO.:  [id number]   MEDICAL RECORD NO.:  [id number]          PATIENT TYPE:  INP   LOCATION:  3310                         FACILITY:  [facility]   PHYSICIAN:  Sharlet Salina T. Hoxworth, M.D.DATE OF BIRTH:  1929/06/26   DATE OF CONSULTATION:  02/08/2008  DATE OF DISCHARGE:                                 CONSULTATION   TIME OF CONSULTATION:  14:25 p.m.   REQUESTING PHYSICIAN:  Kerin Perna, MD.   CONSULTING PHYSICIAN:  Lorne Skeens. Hoxworth, MD   VASCULAR SURGEON:  Dr. Myra Gianotti IV, as well as Dr. Darrick Penna, I think.   REASON FOR CONSULTATION:  Questionable ischemic bowel.   HISTORY OF PRESENT ILLNESS:  Ms. Donaghey is a 77 year old white female  with a history of hypertension, atrial fibrillation for which she is on  chronic Coumadin, rheumatoid arthritis, diabetes mellitus, and  hypothyroidism, who presented to Dr. Beatriz Stallion office, who is her primary  care physician on January 25, 2008, with complaint of abdominal pain,  nausea, and vomiting.  Since her abdominal pain was epigastric, she was  a direct admit to [facility].  At this time, an admission EKG was  performed, which showed AFib with 150 beats per minute.  This EKG also  showed significant ST segment depression in the inferior lateral leads.  Cardiac markers at this time were all negative.  However, due to the  patient's risk for coronary artery disease, the patient was transferred  to [facility], where upon admission, she underwent a cardiac  catheterization.  This showed a 90-95% stenosis of her left main artery  as well as an 80% stenosis of her obtuse marginal vessel.  At that time,  Dr. Donata Clay was consulted and a triple CABG was performed.  During  this procedure, her blood pressure was stable, and there were no  complications.  Several days later, the patient developed a cold leg as  well as some numbness, and at this time, Vascular Surgery was consulted.  It was found that the  patient had right lower extremity ischemia.  Therefore, a multi-vessel embolization was done of her bilateral femoral  artery as well as several other arteries.  After, this procedure, the  patient then developed compartment syndrome of her right lower  extremity, and had to undergo an anterior and  lateral compartment  fasciotomy.  Since then, the patient has complained of several days of  very mild abdominal pain with some nausea especially with ambulation.  In the meantime, her white count was 18,700 on February 06, 2008.  However, as of today has increased to 36,400.  Currently, the patient  states that she does not have any abdominal pain, just some minimal  nausea.  A CT angio of the abdomen and pelvis was done yesterday, which  showed major three-vessel mesenteric disease of the SMA, IMA, and celiac  artery.  It did appear that there was either a subtotal or complete  obstruction of the SMA with severe stenosis of the IMA and celiac  artery.  CT also showed wall thickening of the right colon as well as  the terminal ileum.  Due to the increase in white blood count as well as  some abdominal pain and the CT angio, we were consulted for a  questionable ischemic bowel.   REVIEW OF SYSTEMS:  Please see HPI.  Otherwise, the patient complains of  some leg pain from her fasciotomy, very mild abdominal pain.  However,  she denies any chest pain or shortness of breath.  She states that she  had a normal soft bowel movement yesterday as well as today.  She is  unsure if they were bloody.  However, her nurse stated that they were  not.  Please see HPI for further details.   FAMILY HISTORY:  Noncontributory.   PAST MEDICAL HISTORY:  Significant for:  1. Hypertension.  2. Atrial fibrillation for which she is on Coumadin.  3. Rheumatoid arthritis.  4. Diabetes mellitus.  5. Hypothyroidism.  6. History of GI bleed secondary to colonic ulceration.   PAST SURGICAL HISTORY:  1. Bilateral  knee replacement.  2. Laparoscopic appendectomy.  3. Coronary artery bypass grafting x3.  4. Tubal ligation.  5. Multi-vessel embolizations of her lower extremities.  6. Inferior/lateral compartment fasciotomy of right lower extremity.   SOCIAL HISTORY:  The patient lives alone and is active.  She does not  use tobacco or alcohol.  She does states that she is widowed and does  have 7 children.   ALLERGIES:  NKDA.   MEDICATIONS:  1. Clonidine 0.3 mg b.i.d.  2. Methotrexate 10 mg weekly on Tuesdays.  3. Lanoxin 0.25 mg daily.  4. Coumadin 5 mg daily.  5. Synthroid 50 mcg daily.  6. Diltiazem 300 mg daily.   PHYSICAL EXAMINATION:  GENERAL:  Ms. Labarre is a 77 year old white female  who is currently lying in bed in no acute distress.  However, appears to  be very worn-out and tired.  VITAL SIGNS:  Temperature 96.8, pulse 72, respirations 23, and blood  pressure 141/57.  HEENT:  Eyes, sclerae noninjected.  Pupils are equal, round, and  reactive to light.  Ears, nose, mouth, and throat:  Ears and nose  without any obvious masses or lesions.  No rhinorrhea.  Mouth is pink  and moist.  Throat shows no exudate.  NECK:  Supple.  Trachea is midline.  No thyromegaly.  HEART:  Regular rate and rhythm.  Normal S1 and S2.  No murmurs,  gallops, or rubs are noted.  The patient has pedal, radial, and carotid  pulses bilaterally.  LUNGS:  Somewhat diminished at the bases.  Otherwise, clear to  auscultation bilaterally without any wheezes, rhonchi, or rales noted.  Respiratory effort is nonlabored.  ABDOMEN:  Soft, mild tenderness in the right lower quadrant without  guarding.  She does have a active bowel sounds and is nondistended.  She  does have multiple bandages present from her prior surgeries this  admission.  Otherwise, she does not have any masses or hernias and no  peritoneal signs are noted.  MUSCULOSKELETAL:  All 4 extremities are symmetrical.  Her right lower  extremity has an Ace  bandage wrap on it, which was not removed.  However, this is present due to her fasciotomy.  Otherwise, she does not  have any cyanosis, clubbing, or edema on her left lower extremity.  Her  hands on both sides are somewhat deformed due to her rheumatoid  arthritis.  SKIN:  Warm and dry without any obvious masses, lesions, or rashes.  NEUROLOGIC:  Cranial nerves II through XII appeared  to be grossly  intact.  Deep tendon reflex exam is deferred at this time.  PSYCH:  The patient is alert and oriented x3 with a somewhat depressed  affect.   LABORATORY DATA AND DIAGNOSTICS:  Blood cultures are negative.  White  blood cell count is 36,400, hemoglobin is 8.6 which has come down from  10.5 yesterday, hematocrit 24.7, platelet count is 376,000, and  neutrophils 88%.  Sodium 131, potassium 3.1, BUN 22, creatinine 0.77,  glucose 173, chloride is 100, and CO2 is 25.  Serum bilirubin is 0.7,  alkaline phosphatase is 56, AST 118, ALT 84, amylase from February 07, 2008 was 145, lipase is normal at 26.  PT 1.8, lactic acid on February 07, 2008 was 1.4.  Diagnostics, CT angio of abdomen and pelvis shows an  almost completely occluded SMA with also severe stenosis of the IMA as  well as celiac artery.  There is thickening of the right colon as well  as small bowel near the terminal ileum, which is questionable for  ischemia.  Otherwise, of note, there is renal artery stenosis  bilaterally as well as small to moderate pleural effusions with  compressive atelectasis noted in both lungs.   IMPRESSION:  1. Coronary artery disease status post triple coronary artery bypass      grafting.  2. Status post embolization of multiple lower extremity arteries      secondary to acute ischemia of right lower extremity.  3. Compartment syndrome of the right lower extremity.  4. Status post fasciotomy of right lower extremity.  5. Leukocytosis.  6. Mild abdominal pain which is questionable for ischemic bowel.  7.  Anemia, unknown etiology.  8. Hypokalemia.  9. Diabetes mellitus.  10.Atrial fibrillation.  11.Hypertension.  12.Rheumatoid arthritis.  13.Hypothyroidism.   PLAN:  At this time, the patient has been interviewed and examined by  myself as well as Dr. Johna Sheriff.  At this time, the patient is not having  severe abdominal pain.  However, it is concerning for possible ischemic  bowel based on the patient's labs and diagnostic tests.  At this time,  it is suspected that she may have some degree of ischemia of her right  colon and terminal ileum.  However, she has minimal pain and tenderness  currently.  At this time, we will follow along with you closely.  However, the patient is currently unwilling to have any more operations.  Hopefully, this possible ischemic will recover with conservative  treatment.  However, she is at risk for further significant  mesenteric ischemia due to her multiple mesenteric vessel disease.  Otherwise though, we will follow along with you conservatively and if  things do change, we will readdress the surgery issue at that time.  However, at this time, the patient does seem pretty persistent on the  fact that she is not willing to have more operations.      Letha Cape, PA      Lorne Skeens. Hoxworth, M.D.  Electronically Signed    KEO/MEDQ  D:  02/08/2008  T:  02/09/2008  Job:  [id number]   cc:   [name]. Darrick Penna, MD  Kerin Perna, M.D.  Erasmo Downer, MD  Everardo Beals. Juanda Chance, MD, [facility]  Learta Codding, MD,FACC

## 2010-08-24 NOTE — Assessment & Plan Note (Signed)
OFFICE VISIT   [name], [name]  DOB:  April 16, 1929                                       10/22/2008  OEUMP#:[id number]   The patient returns for follow-up today.  She underwent stenting of her  celiac artery by Dr. Myra Gianotti on Sep 02, 2008.  She states that she has  had no abdominal pain since then.  She denies any postprandial abdominal  pain.  She states that she thinks she has gained a little bit of weight  but certainly has not lost any weight.   She denies any lower extremity symptoms of claudication.  She does have  occasional numbness and swelling in her feet.  She is currently walking  every day and walks over a large amount of property in her backyard.  Overall, she appears to be doing well.   We have been following her for previous embolization to her lower  extremities and mesenteric artery occlusive disease.   PHYSICAL EXAM:  Today, blood pressure 169/70, pulse 63.  Abdomen:  Soft,  nontender, nondistended.  No masses.  Extremities:  She has 2+ femoral  and 2+ dorsalis pedis pulses bilaterally.  She has no edema.  The feet  are pink, warm and well-perfused.  She has no ulcerations on the feet.  She does have an abdominal bruit.   She had bilateral ABIs performed today which were greater than 1  bilaterally with triphasic waveforms bilaterally.  She had a monophasic  posterior tibial wave form on the right.  She had a mesenteric duplex  scan which showed elevated velocities in the celiac artery between 440  distally and 689 proximally.   In summary, the patient has evidence of recurrent stenosis within her  celiac artery stent with elevated velocities.  Fortunately, this is  currently asymptomatic.  She has known occlusion of her inferior  mesenteric artery and a short proximal segment occlusion of her superior  mesenteric artery.  Her lower extremity occlusive disease is stable with  normal ABIs bilaterally.   I believe the best option  would be to obtain a CT angiogram to further  confirm whether or not the celiac artery stent has restenosed.  She is  currently on Coumadin and aspirin.   Of note, her weight today was 130 pounds.  She will see me again next  week after her CT angio of the abdomen and pelvis.   [name]. Fields, MD  Electronically Signed   CEF/MEDQ  D:  10/22/2008  T:  10/23/2008  Job:  [id number]   cc:   Erasmo Downer, MD  Everardo Beals. Juanda Chance, MD, [facility]  V. Charlena Cross, MD

## 2010-08-24 NOTE — Discharge Summary (Signed)
[name], [name]               ACCOUNT NO.:  [id number]   MEDICAL RECORD NO.:  [id number]          PATIENT TYPE:  INP   LOCATION:  2609                         FACILITY:  [facility]   PHYSICIAN:  Cheryl Hora. Fields, MD  DATE OF BIRTH:  08/30/1929   DATE OF ADMISSION:  09/01/2008  DATE OF DISCHARGE:  09/10/2008                               DISCHARGE SUMMARY   FINAL DISCHARGE DIAGNOSES:  1. Mesenteric ischemia.  2. Peripheral vascular disease.  3. Hypertension.  4. Atrial flutter.  5. Dyslipidemia.  6. Diabetes mellitus, non-insulin dependent.   PROCEDURES PERFORMED:  1. Celiac artery stent Sep 02, 2008 by Dr. Myra Gianotti.  2. Ablation of atrial flutter by Dr. Ladona Ridgel on September 09, 2008.   COMPLICATIONS:  None.   CONDITION AT DISCHARGE:  Stable improving.   DISCHARGE MEDICATIONS:  1. Lisinopril 20 mg p.o. daily.  2. Coumadin 3 mg p.o. daily.  3. Clonazepam 0.5 mg p.o. daily.  4. Pravastatin 40 mg p.o. daily.  5. Torsemide/Demadex 20 mg p.o. b.i.d.  6. Metanx foot medicine p.o. daily.  7. Metformin 500 mg p.o. daily to resume September 11, 2008.  8. Prilosec OTC p.o. daily.  9. Synthroid 50 mcg p.o. daily.  10.Colace 100 mg p.o. b.i.d.  11.Metoprolol 25 mg p.o. b.i.d.  12.Amiodarone 200 mg p.o. daily.  13.Norvasc 5 mg p.o. daily, this is a new dose for her.  14.Aspirin 81 mg p.o. daily.  15.Potassium 20 mEq p.o. b.i.d.  16.She is also given a prescription for Plavix 75 mg p.o. daily for      the next 6 weeks.   DISPOSITION:  She is being discharged home in stable condition.  She is  instructed to obtain a protime on Friday, September 12, 2008, and also she  will be given an appointment to see Dr. Darrick Penna in 6 weeks for continuing  followup.   At discharge, PT/INR is 32.8 and 2.9.  She will follow up with Dr.  Ladona Ridgel at his discretion.   BRIEF IDENTIFYING STATEMENT:  For complete details, please refer the  typed history and physical.  Briefly, this very pleasant 77 year old  woman has  been experiencing vague abdominal symptoms for several weeks,  which worsened just before her admission.  She was admitted for  evaluation.   HOSPITAL COURSE:  She was admitted to a bed on a surgical convalescent  floor.  She was worked up for mesenteric ischemia.  On Sep 02, 2008, she  underwent angiography that demonstrated a SMA and celiac occlusion.  A  stent was able to be advanced with resumption of flow.  The procedure  was without complications.  She was returned to her room.  The following  day, she had no further symptoms of mesenteric ischemia.  We reinitiated  Coumadin therapy.  She developed bradycardia.  We asked the  electrophysiologist to evaluate her.  She continued to have faint  bradycardia with pauses up to 4.23 seconds.  Adjustments were made in  her medications.   She developed atrial flutter.  Dr. Ladona Ridgel recommended ablation of the  flutter.  She was informed of the  risks and benefits and elected to  proceed with the procedure.  On September 09, 2008, she was taken to the  Electrophysiology Suite for flutter ablation, which has apparently  succeeded.  She was found to be stable on September 10, 2008.  She has  clearance from Cardiology to be discharged.  We are discharging her in  stable condition.  She will continue with Coumadin at 3 mg p.o. daily.      Wilmon Arms, PA      Cheryl Hora. Fields, MD  Electronically Signed    KEL/MEDQ  D:  09/10/2008  T:  09/10/2008  Job:  [id number]

## 2010-08-24 NOTE — Assessment & Plan Note (Signed)
[facility]                            CARDIOLOGY OFFICE NOTE   MICHAELAH, [name]                      MRN:          [id number]  DATE:03/13/2008                            DOB:          Oct 08, 1929    The patient was seen in [facility] on March 13, 2008, for Dr.  Charlies Constable.   PRIMARY CARDIOLOGIST:  Learta Codding, MD, [facility] in [place].   CARDIOVASCULAR SURGEON:  Kerin Perna, MD   VASCULAR SURGEON:  [name]. Fields, MD   PRIMARY CARE PHYSICIAN:  Erasmo Downer, MD   Ms. Bessire is a very pleasant 77 year old white female patient who was  transferred to [facility] with rapid atrial fibrillation and ST  depression.  Cardiac enzymes were negative, but she underwent cardiac  catheterization by Dr. Charlies Constable on January 28, 2008, and was found  to have 90% ostial left main, 40% LAD, 80% second marginal of the  circumflex, 50% small nondominant RCA with normal LV function.  She also  had a 95% right renal artery stenosis.  She was seen in consult by Dr.  Donata Clay and underwent CABG x3.  She had a LIMA to the LAD, SVG to the  OM-1 and OM-3 and SVG to the OM-2.  The patient converted to sinus  rhythm and has been maintained on amiodarone.  She then developed an  ischemic right foot and had to undergo right superficial femoral artery  and anterior tibial embolectomy and endarterectomy of the tibioperoneal  trunk and patch angioplasty of the popliteal and tibioperoneal trunk by  Dr. Darrick Penna.  The patient also had quite a bit of abdominal pain and had  a CT angio to evaluate for a mesenteric ischemia.  Study was done at  [facility] before she came and revealed 75% celiac stenosis as well as  occlusion of the SMA and stenosis of the IMA.  CT on February 07, 2008,  showed no clear-cut evidence of bowel ischemia, but chronic arterial  occlusive disease in the SMA and celiac, but no evidence of embolic  disease.  As ischemia was felt to be highly  likely, she was made n.p.o.  and placed on TNA for ongoing conservative management.  The patient was  eventually discharged to [facility] which she is  currently staying in.  She stated her right leg has been giving her a  lot of trouble and she saw Dr. Darrick Penna yesterday, in which he placed  something to have better closure and heal better.  It was yesterday from  Dr. Darrick Penna.  She also has quite a bit of bilateral lower extremity  edema, which she has never had a problem within the past.  She denies  any chest pain, shortness of breath, palpitations, dizziness, or  presyncopal symptoms.  Overall, she feels she is doing well except for  her right leg.  She does have some lower abdominal gas, but does not  have any significant abdominal pain.   CURRENT MEDICATIONS:  She is on;  1. Amiodarone 200 mg daily.  2. Norvasc 10 mg daily.  3. Aspirin 81 mg daily.  4. Lasix 20 mg daily.  5. Omeprazole 20 mg daily.  6. Simvastatin 20 mg daily.  7. Synthroid 50 mcg daily.  8. Coumadin as directed.  9. Clonidine 0.2 mg b.i.d.  10.Colace 100 mg b.i.d.  11.Lopressor 25 mg b.i.d.  12.Hydralazine 25 mg t.i.d.  13.Ambien 5 mg daily.  14.NovoLog insulin 100 units daily.   PHYSICAL EXAMINATION:  GENERAL:  This is a very pleasant 77 year old  white female in no acute distress.  VITAL SIGNS:  Blood pressure 128/74 and pulse 64.  She was not weighed,  as she is in a wheelchair.  NECK:  Without JVD, HJR, bruit, or thyroid enlargement.  LUNGS:  Decreased breath sounds at the left base, but clear elsewhere.  HEART:  Regular rate and rhythm at 64 beats per minute.  Normal S1 and  S2.  No murmur, rub, bruit, thrill, or heave noted.  CHEST:  Incisions are healing well.  ABDOMEN:  Soft without organomegaly, masses, lesions, or abnormal  tenderness.  EXTREMITIES:  She has +2 to 3 pitting edema bilaterally.  I did not take  the bandage off her right lower extremity that was just put on by Dr.   Darrick Penna yesterday.  She does have positive distal pulses.   EKG; normal sinus rhythm with nonspecific ST-T wave changes.  No acute  change.   IMPRESSION:  1. Coronary artery disease status post coronary artery bypass graft x3      with a left internal mammary artery to the left anterior      descending, saphenous vein graft to the obtuse marginal 1 and      obtuse marginal 3, and saphenous vein graft to the obtuse marginal      2.  2. Status post right superficial femoral artery and anterior tibial      embolectomy, endarterectomy of the tibioperoneal trunk, and patch      angioplasty of the popliteal and tibioperoneal trunk on February 05, 2008, for ischemic right foot.  3. 95% right renal artery stenosis.  4. Question of ischemic bowel.  Please see above dictation.  5. Atrial fibrillation converted to normal sinus rhythm, currently on      amiodarone  6. Hypertension.  7. History of chronic atrial fibrillation on Coumadin for 6 years, but      maintaining sinus rhythm at this time.  8. Status post bilateral knee replacements.  9. History of laparoscopic appendectomy.  10.Hypothyroidism.  11.Chronic Coumadin.  12.History of gastrointestinal bleed secondary to colonic ulcerations      in December 2006.   PLAN:  Overall, I think the patient is doing quite well.  She does have  quite a bit of bilateral lower extremity edema.  I am increasing her  Lasix to 40 mg daily and adding K-Dur 20 mEq daily, and she will see Dr.  Andee Lineman back in the office in 1-2 months.      Jacolyn Reedy, PA-C  Electronically Signed      Everardo Beals. Juanda Chance, MD, [facility]  Electronically Signed   ML/MedQ  DD: 03/13/2008  DT: 03/14/2008  Job #: [id number]   cc:   Learta Codding, MD,FACC  Kerin Perna, M.D.  [name]. Darrick Penna, MD  Erasmo Downer, MD

## 2010-08-24 NOTE — Procedures (Signed)
MESENTERIC ARTERIAL DUPLEX EVALUATION   INDICATION:  Follow up celiac artery stent placed on 09/02/08, known  proximal SMA occlusion.   HISTORY:  Diabetes:  Yes.  Cardiac:  CABG, CHF.  Hypertension:  Yes.  Smoking:  No.   Mesenteric Duplex Findings:  Aorta - Proximal                            97  Aorta - Mid                                 71  Aorta - Distal                              87   Celiac Trunk - Proximal                     243/73  Celiac Trunk - Distal                       302/81   Hepatic Artery                              208  Splenic Artery                              128   Superior Mesenteric Artery-Origin           Occluded  Superior Mesenteric Artery-Proximal         202  Superior Mesenteric Artery-Mid              161  Superior Mesenteric Artery-Distal           129   Inferior Mesenteric Artery-Proximal         Not visualized   IMPRESSION:  1. Patent celiac artery stent with Doppler velocities that are      suggestive of >75% stenosis.  2. Known occlusion of the superior mesenteric artery origin was      reconstitution of flow distally.  3. The inferior superior mesenteric artery not adequately visualized.  4. Somewhat limited visualization due to increased bowel gas artifact.  5. No significant changes from previous examination.   ___________________________________________  [name] Fields, MD   NT/MEDQ  D:  03/25/2010  T:  03/25/2010  Job:  [id number]

## 2010-08-24 NOTE — Assessment & Plan Note (Signed)
OFFICE VISIT   DARCHELLE, [name]  DOB:  27-Sep-1929                                       04/16/2008  EAVWU#:[id number]   The patient is seen in follow-up today.  She previously underwent four-  compartment fasciotomy and embolectomy of her right superficial femoral  artery, anterior tibial and tibioperoneal trunk with patch angioplasty  of the popliteal artery in October 2009.  On exam today, the fasciotomy  on the right leg is essentially healed.  Feet are pink, warm and well-  perfused bilaterally.  Blood pressure today was 145/72.  Heart rate 65  and regular.  Her weight today was 147.4 pounds.  This is a 7 pound  increase from early December.  She states she is eating well and has no  evidence of food fear or postprandial pain.  Overall, the patient is  doing well.  She does have evidence of multiple mesenteric artery  occlusions.  However, this appears to be asymptomatic at this point.  Her leg is now essentially healed.  Overall she is doing well.  She will  follow up with me in six months' time for repeat ABIs.  If she develops  symptoms of mesenteric ischemia, we could revisit the idea of whether or  not to do any mesenteric revascularization.  As long as she remains  asymptomatic and has continued to gain weight, I would prefer to defer  this for now.   [name]. Fields, MD  Electronically Signed   CEF/MEDQ  D:  04/16/2008  T:  04/17/2008  Job:  [id number]

## 2010-08-24 NOTE — Consult Note (Signed)
NAMEMONTI, VILLERS               ACCOUNT NO.:  [id number]   MEDICAL RECORD NO.:  [id number]          PATIENT TYPE:  INP   LOCATION:  2028                         FACILITY:  [facility]   PHYSICIAN:  Doylene Canning. Ladona Ridgel, MD    DATE OF BIRTH:  22-Feb-1930   DATE OF CONSULTATION:  09/04/2008  DATE OF DISCHARGE:                                 CONSULTATION   REQUESTING PHYSICIAN:  [name]. Fields, MD   REASON FOR CONSULTATION:  Evaluation and treatment of patient with  symptomatic bradycardia after a stent for mesenteric ischemia.   HISTORY OF PRESENT ILLNESS:  The patient is 76 year old.  She has  multiple medical problems including coronary artery disease,  hypertension, paroxysmal AFib, hyperlipidemia, diabetes,  hyperthyroidism, who is admitted to the hospital with nausea, vomiting,  and abdominal pain.  She subsequently found to have mesenteric ischemia  and underwent stenting of her celiac artery several days ago.  She has  been stable and her abdominal discomfort has improved; however, the  patient has been noted to have worsening problems with bradycardia  pauses up to 3 seconds during the day while she is awake associated with  dizziness.  She has also had some though not prolonged bradycardia with  heart rates in the 30s.  The patient has never had frank syncope.   CURRENT MEDICATIONS:  1. Amiodarone 200 a day.  2. Amlodipine 10 a day.  3. Clonidine 0.2 twice daily.  4. Coumadin as directed.  5. She previously was also on metoprolol, Pravachol and she is also on      Demadex.   PAST MEDICAL HISTORY:  As noted in the HPI.  She also has a history of  renal artery stenosis and rheumatoid arthritis.   PAST SURGICAL HISTORY:  Bypass grafting in 2009.  She has a history of  an embolus to her right lower extremity in October 2009 requiring  embolectomy.  She has history of bilateral knee replacements and prior  appendectomy.   ALLERGIES:  She has no known allergies.   SOCIAL  HISTORY:  The patient lives in [place].  She is retired.  She  denies tobacco or ethanol use.   FAMILY HISTORY:  Noncontributory at her advanced age, except that her  sister does have coronary artery disease.   REVIEW OF SYSTEMS:  Review of systems were all reviewed.  All systems  are negative except as noted in the HPI as well as some arthritic pain  including arthralgias.  Otherwise, all systems reviewed were negative  except as noted above.   PHYSICAL EXAMINATION:  GENERAL:  She is a pleasant elderly woman in no  distress.  VITAL SIGNS:  Blood pressure was 140/90, the pulse was 50 and regular,  the respirations were 18, temperature is 98.  HEENT:  Normocephalic and atraumatic.  Pupils are equal and round.  The  oropharynx is moist.  Sclerae anicteric.  NECK:  No jugular venous distention.  No thyromegaly.  Trachea is  midline.  Carotids were 1+ and symmetric bilaterally.  There were soft  carotid bruits present.  ABDOMEN:  Soft, nontender, there  is no organomegaly.  The bowel sounds  are present and no rebound or guarding.  LUNGS:  Clear bilaterally to auscultation.  No wheezes, rales or rhonchi  are present.  No increased work of breathing.  CARDIOVASCULAR:  Regular bradycardia with normal S1 and S2.  There is  soft systolic murmur in left lower sternal border.  The PMI was not  enlarged or laterally displaced.  EXTREMITIES:  No cyanosis, clubbing or edema.  The pulses are 1+ and  symmetric bilaterally.   IMPRESSION:  1. Symptomatic bradycardia on amiodarone and clonidine previously on      metoprolol.  2. Mesenteric ischemia status post iliac artery stenting.  3. Hypertension.  4. Coronary artery disease status post bypass surgery.   DISCUSSION:  At this point, the patient has fairly significant  bradycardia.  She is, however, on fairly high dose clonidine along with  a moderate dose of amiodarone.  I have recommend that the patient  decrease her clonidine from 0.2  twice daily to 0.1 mg daily and I have  also recommended that she decrease her amiodarone to 100 mg a day from  200 mg daily.      Doylene Canning. Ladona Ridgel, MD  Electronically Signed     GWT/MEDQ  D:  09/04/2008  T:  09/05/2008  Job:  [id number]   cc:   Learta Codding, MD,FACC  Doreen Beam, MD

## 2010-08-24 NOTE — Consult Note (Signed)
[name], [name] NO.:  [id number]   MEDICAL RECORD NO.:  [id number]          PATIENT TYPE:  INP   LOCATION:  2925                         FACILITY:  [facility]   PHYSICIAN:  Kerin Perna, M.D.  DATE OF BIRTH:  07/05/1929   DATE OF CONSULTATION:  DATE OF DISCHARGE:                                 CONSULTATION   REQUESTING PHYSICIAN:  Everardo Beals. Juanda Chance, MD, [facility]   PRIMARY CARE PHYSICIAN:  Dr. Linna Darner in [place].   REASON FOR CONSULTATION:  Severe left main stenosis with unstable  angina.   CHIEF COMPLAINT:  Upper abdominal pain.   HISTORY OF PRESENT ILLNESS:  I was asked to evaluate this 77 year old  female, diabetic, nonsmoker for potential multivessel coronary artery  bypass surgery for recently diagnosed critical left main stenosis.  The  patient was admitted to [facility] 5 days ago with upper abdominal  pain.  She had rapid atrial fibrillation at that time and some ST-  segment depression.  Cardiac enzymes were normal, and there was no  evidence of pulmonary edema on chest x-ray.  She was treated with  digoxin and Cardizem to treat the AFib and had a CT of the abdomen,  which was negative.  Because of concern over positive risk factors for  coronary artery disease, she was transferred to [facility] today  where she underwent left heart cath this morning by Dr. Juanda Chance.  This  demonstrated a 90-95% stenosis of the left main with a left dominant  coronary circulation and an 80% stenosis of the obtuse marginal vessel  off the circumflex.  The right coronary was small and nondominant and  overall LVEF was well preserved.  She was noted to have a 95% stenosis  of the right renal artery on her abdominal injection.  Because of her  coronary anatomy and symptoms, she is felt to be candidate for urgent  surgical coronary revascularization.   PAST MEDICAL HISTORY:  1. Hypertension.  2. Atrial fibrillation x6 years on Coumadin.  3. Rheumatoid arthritis on  methotrexate.  4. Status post bilateral knee replacement.  5. Status post laparoscopic appendectomy.  6. Recent 2-D echo showing mild MR and mild TR.  7. Hypothyroidism, on therapy.  8. Chronic Coumadin therapy for atrial fib.  9. History of GI bleed secondary to colonic ulcerations, December      2006.   HOME MEDICATIONS:  1. Clonidine 0.3 mg b.i.d.  2. Methotrexate 10 mg p.o. weekly (Tuesday).  3. Lanoxin 0.25 mg daily.  4. Coumadin 5 mg daily.  5. Synthroid 50 mcg daily.  6. Diltiazem 300 mg daily.   ALLERGIES:  None.   SOCIAL HISTORY:  She lives alone.  She is active.  She does not smoke or  drink alcohol.   FAMILY HISTORY:  Negative.   REVIEW OF SYSTEMS:  CONSTITUTIONAL:  Negative fever or weight loss.  ENT:  Negative for change in vision.  She has upper and lower dental  plates.  She denies difficulty swallowing.  THORACIC:  Negative for chest trauma or negative for abnormal chest x-  ray.  She has  dyspnea on exertion but denies orthopnea or PND.   She has significant upper abdominal epigastric pain with meals or  activity, probable anginal equivalent.  The patient has diet-controlled  diabetes and hypothyroidism.  She denies DVT.  She states she has poor  circulation in lower extremities but denies neuropathy.  Arthritis has  affected her hands and knees fairly severely.  She denies history of  stroke or seizure or TIA.   PHYSICAL EXAMINATION:  VITAL SIGNS:  She is 5 feet 4 inches.  Weight is  65 kg, blood pressure 130/80, pulse is 70 in atrial fib, and oxygen  saturation 95%.  GENERAL APPEARANCE:  A pleasant elderly female in the  CCU following cardiac catheterization, in no acute distress but slightly  anxious.  HEENT:  Normocephalic.  Pupils are equal.  She has upper and lower  dental plates.  NECK:  Without JVD, mass, and she has a left carotid bruit.  LYMPHATICS:  No palpable, supraclavicular, or cervical adenopathy.  Breath sounds are clear and equal.   CARDIAC:  Irregular heart rhythm without murmur or gallop.  ABDOMEN:  Soft without pulsatile mass.  EXTREMITIES:  No clubbing, cyanosis, or tenderness.  She has bilateral  knee incisions from total knee replacement and some nonpitting ankle  edema bilaterally.  Peripheral pulses are intact in all extremities.  NEUROLOGIC:  Nonfocal.  She has hand deformity with lateral displacement  of the metacarpals from rheumatoid arthritis.   LABORATORY DATA:  Reviewed the coronary arteriograms performed by Dr.  Juanda Chance and agree with this impression of severe left main stenosis.   RECOMMENDATION AND PLAN:  The patient would benefit from surgical  revascularization of the LAD, OM1, and distal circumflex vessels.  We  will schedule her surgery for the morning.  She will obtain her pre CABG  Dopplers and chest x-ray this evening.  I discussed indications,  benefits, and alternatives of surgery with the patient as well as her  family.  She understands the situation such that without  revascularization she will have persistent continued pain and at very  high risk for a severe MI which could be fatal.  She agrees to proceed  with surgery in the morning.      Kerin Perna, M.D.  Electronically Signed     PV/MEDQ  D:  01/28/2008  T:  01/29/2008  Job:  [id number]

## 2010-08-24 NOTE — Assessment & Plan Note (Signed)
OFFICE VISIT   KENSLY, BOWMER  DOB:  March 16, 1930                                       06/18/2008  MVHQI#:[id number]   The patient returns today with concerns of nonhealing of one of her  fasciotomy sites and some drainage.  She previously had thrombectomy of  her right leg and a bovine pericardial patch on the popliteal artery.  She has two superficial sinus tracts which have developed on the medial  aspect of her leg in the old fasciotomy site.  There is no erythema.  She denies any fever or chills.  She has lower extremity swelling  bilaterally from the knees down.  Both of the knees are swollen  symmetrically.  She does have a prosthetic knee on the right side.  On  probing the wound in the right medial leg each of these sinus tracts is  approximately 1 cm deep.  There is no significant purulent drainage from  these.  Each of these was packed with Nugauze.  We will start doing  wound care twice a day.  I reassured the patient that I do not think  there is a deep invasive infection at this time.  We will continue to do  the wound care for the next couple of weeks.  I do not believe she needs  antibiotics again since this is not invasive.  If she continues to have  difficulty and the wounds do not spontaneously heal soon we will  consider doing a CT scan of the leg to make sure there is no deep seated  fluid collection.   [name]. Fields, MD  Electronically Signed   CEF/MEDQ  D:  06/18/2008  T:  06/19/2008  Job:  [id number]   cc:   Erasmo Downer, MD  Everardo Beals. Juanda Chance, MD, [facility]

## 2010-08-24 NOTE — Discharge Summary (Signed)
[name], [name] NO.:  [id number]   MEDICAL RECORD NO.:  [id number]          PATIENT TYPE:  INP   LOCATION:  2013                         FACILITY:  [facility]   PHYSICIAN:  Kerin Perna, M.D.  DATE OF BIRTH:  07/13/1929   DATE OF ADMISSION:  01/27/2008  DATE OF DISCHARGE:  02/22/2008                               DISCHARGE SUMMARY   HISTORY:  The patient is a 77 year old female who was admitted in  transfer from [facility] where she was admitted approximately  five days prior with upper abdominal pain. She was found to have rapid  atrial fibrillation as well as some ST-segment depression. Cardiac  enzymes were normal, and there was no evidence of pulmonary edema on  chest x-ray. She was treated with digoxin and Cardizem for her atrial  fibrillation, and a CT of the abdomen was negative. Because of the  concern of a positive risk for coronary artery disease, she was  transferred to [facility] for further evaluation and treatment.   PAST MEDICAL HISTORY:  1. Hypertension.  2. Chronic atrial fibrillation for 6 years on Coumadin.  3. Rheumatoid arthritis.  4. Status post bilateral knee replacements.  5. History of laparoscopic appendectomy.  6. Recent 2-D echocardiogram showing mild mitral regurgitation and      mild tricuspid regurgitation.  7. Hypothyroidism.  8. Chronic Coumadin therapy for atrial fibrillation.  9. History of GI bleed secondary to colonic ulcerations December 2006.   HOME MEDICATIONS:  1. Clonidine 0.3 mg b.i.d.  2. Methotrexate 10 mg weekly on Tuesdays.  3. Lanoxin 0.25 mg daily.  4. Coumadin 5 mg daily.  5. Synthroid 50 mcg daily.  6. Diltiazem 300 mg daily.   ALLERGIES:  None.   Family history, social history, review of systems, physical exam:  Please see the history and physical done at the time of admission.   HOSPITAL COURSE:  The patient was admitted. She was taken to cardiac  catheterization and found to  have a 90-95% stenosis of the left main  with a left dominant coronary circulation and an 80% stenosis of the  obtuse marginal vessel off of the circumflex. The right coronary artery  was small and nondominant, and overall left ventricular ejection  fraction was well preserved. She was noted to have a 95% stenosis of the  right renal artery on her abdominal injection. Due to the findings, she  was felt to have significant coronary obstructive anatomy, and she  required surgical consultation, and this was obtained with Kathlee Nations  Trigt. He evaluated the patient's studies and recommended proceeding  with surgical revascularization.   PROCEDURE:  On January 19, 2008, she was taken to the operating room  where she underwent coronary artery bypass grafting x3. The following  grafts were placed:  1. Left internal mammary artery to the LAD.  2. Saphenous vein graft to obtuse marginal #1 and #3, saphenous vein      graft to the obtuse marginal #2.   The patient tolerated the procedure well and was taken to the surgical  intensive care unit in stable  condition.   POSTOPERATIVE HOSPITAL COURSE:  The patient initially did quite well,  progressing very smoothly her first four to five days. She remained  hemodynamically stable.  She remained in a normal sinus rhythm. She did  have a mild thrombocytopenia which was monitored closely. Additionally,  she had an acute blood loss anemia which was fairly stable. All routine  lines, monitors, and drainage devices were discontinued in the standard  fashion. She was tolerating cardiac rehabilitation with good overall  progress, and the plan was to send her to a skilled nursing facility for  ongoing rehabilitation. On February 04, 2008, however, she was found to  have some significant ischemic findings in her right foot. Vascular  surgical consultation was obtained. ABIs were noted to be 0.2 on the  right and 1.0 on the left. Her foot initially did appear  quite viable  with motor and sensation intact. However, her ischemic did progress, and  she ultimately did require an arteriography, and subsequent to this,  findings of embolus in the right SFA and anterior tibial artery lead to  the following procedure on February 05, 2008:  Right superficial femoral  artery and anterior tibial embolectomy. Additionally, an endarterectomy  of the tibial peroneal trunk, a patch angioplasty of the  popliteal/tibioperoneal trunk, and arteriogram x2. This procedure was  performed by Dr. Fabienne Bruns. She tolerated it well and had a  palpable pulse at the end of the course. Unfortunately, she did progress  with findings of compartment syndrome. This secondarily necessitated on  February 06, 2008, a right anterior lateral compartment fasciotomy also  done by Dr. Fabienne Bruns. Following this, she was started back on a  course of rehabilitation and placed on heparin with plans to convert to  a VAC dressing. She initially was doing okay following this second  procedure. However, she then developed symptoms of abdominal pain, and  an evaluation including CT angiogram was done to evaluate for mesenteric  ischemia. A study done at [facility] prior to her transfer did  reveal that she had a 75% celiac stenosis as well as occlusion to a SMA  and stenosis to the IMA. These findings necessitated this repeat study,  and a CT scan was done on February 07, 2008, which showed no clear-cut  evidence of bowel ischemia but chronic arterial occlusive disease of the  SMA and celiac but no definite evidence of embolic disease. As ischemia  was felt to be highly likely, she was made n.p.o. and placed on TNA for  ongoing conservative management. She did have a significant elevation of  her white blood cell count to 36,000 on February 08, 2008. She was placed  on a course of antibiotics including Zosyn and Flagyl. Additionally  during this time period, a general surgery  consultation was obtained due  to the worrisome possibility of colon ischemia. She was followed quite  closely, and they managed her dietary advancement. Overall, she did  progress nicely in this regard and did not require any further surgical  intervention. She was kept on TNA for several days, and her diet was  slowly advanced. She appears to be tolerating a regular diet at this  time without any further signs of symptoms of colonic ischemia. Her most  recent white blood cell count dated February 21, 2008, is 11.8. Her  kidney function is stable with BUN of 11, creatinine of 0.69. Her anemia  is stable with hemoglobin of 9.3, hematocrit 27.9. She has had some  guaiac positive stools, but this is felt to be consistent with her colon  ischemia and should continue to improve over time as she clinically  continues to improve. The only additional current issue is she does have  a yeast urinary tract infection and is currently on Diflucan which she  will need to continue for an additional five days. Overall, her status  is felt to be tentatively stable for transfer to the nursing home  facility on February 22, 2008, pending morning round reevaluation.   CURRENT MEDICATIONS:  Include the following:  1. Aspirin 81 mg daily.  2. Lopressor 25 mg twice daily.  3. Zocor 20 mg daily.  4. Coumadin 2.5 mg daily and as directed. Note, she should have a      PT/INR done on February 25, 2008, with the goal INR to be between      2.5 and 3.0 for chronic atrial fibrillation.  5. Synthroid 50 mcg daily.  6. Protonix 40 mg daily.  7. Clonidine 0.2 mg twice daily.  8. Norvasc 10 mg daily.  9. Hydralazine 25 mg 3 times daily.  10.Anusol HC b.i.d. for hemorrhoids p.r.n.  11.Amiodarone 200 mg daily.  12.Diflucan 100 mg daily for 5 days.   INSTRUCTIONS:  The patient should have a negative pressure placement on  her right leg fasciotomy site done at the nursing facility. She  currently has a VAC dressing  system. This is per vascular surgical  instructions. Initially, she should have an INR drawn on February 25, 2008, with a nursing home physician to adjust dosing, or it can be  managed through Dr. Smitty Cords Brodie's office at [facility]. Other  wound care is to clean her incisions gently with soap and water. Lifting  instructions:  The patient should not lift more than 10 pounds and be  real careful in regard to using her arms and shoulders. She should have  physical therapy if available through the nursing facility for  generalized strengthening and post cardiac surgical recovery.   DIET:  Should be a heart-healthy, low-sodium diet.   FOLLOW UP:  Should include Dr. Donata Clay on March 14, 2008, at 3 p.m.  with a chest x-ray from the [facility] office of [facility]  [facility]. These cards will be provided to the patient. Additionally, she  should see Dr. Fabienne Bruns in vascular surgery in 2 weeks.  Appointment can be arranged at [phone]. Additionally, she should see  Dr. Charlies Constable in 2 weeks post discharge. An appointment can be  arranged at [phone].   FINAL DIAGNOSIS:  Severe left main coronary artery disease as described.   OTHER DIAGNOSES:  1. Ischemic bowel secondary to chronic mesenteric arterial occlusive      disease.  2. Status post fasciotomy for right leg acute compartment syndrome.  3. Right superficial femoral artery embolectomy, right anterior tibial      artery embolectomy, right tibioperoneal trunk endarterectomy.  4. Patch angioplasty of the right popliteal artery and tibial peroneal      trunk.  5. Status post abdominal aortogram and right lower extremity run off      on February 05, 2008.  6. History of hypertension.  7. History of atrial fibrillation.  8. History of rheumatoid arthritis.  9. Status post bilateral knee replacements.  10.Status post laparoscopic appendectomy.  11.Hypothyroidism.  12.Mild mitral regurgitation.  13.Mild  tricuspid regurgitation.  14.Hypothyroidism.  15.Chronic Coumadin therapy for atrial fibrillation.  16.History of gastrointestinal bleed secondary to colon  ulcerations.      Rowe Clack, P.A.-C.      Kerin Perna, M.D.  Electronically Signed    WEG/MEDQ  D:  02/21/2008  T:  02/21/2008  Job:  [id number]   cc:   Kerin Perna, M.D.  [name]. Darrick Penna, MD  Lorne Skeens. Hoxworth, M.D.  Bruce Elvera Lennox Juanda Chance, MD, [facility]  Erasmo Downer, MD

## 2010-08-24 NOTE — Procedures (Signed)
MESENTERIC ARTERIAL DUPLEX EVALUATION   INDICATION:  Followup celiac artery stent placed on 09/02/2008, known  proximal SMA occlusion.   HISTORY:  Diabetes:  No.  Cardiac:  CABG, CHF.  Hypertension:  Yes.  Smoking:  No.   Mesenteric Duplex Findings:  Aorta - Proximal                            71  Aorta - Mid                                 95  Aorta - Distal                              114   Celiac Trunk - Proximal                     91  Celiac Trunk - Distal                       289   Hepatic Artery                              146  Splenic Artery                              76   Superior Mesenteric Artery-Origin           Occluded  Superior Mesenteric Artery-Proximal         82  Superior Mesenteric Artery-Mid              85  Superior Mesenteric Artery-Distal           92   Inferior Mesenteric Artery-Proximal         Not visualized   IMPRESSION:  1. Patent celiac artery stent with Doppler velocity suggestive of a      >75% stenosis.  2. Known occlusion of the superior mesenteric artery origin with      reconstitution distally.  3. The inferior mesenteric artery was not adequately visualized due to      overlying bowel gas patterns.  4. Incidental finding:  There is a small aneurysmal dilatation of the      distal aorta measuring 1.1 x 1.1 cm.       ___________________________________________  [name]. Fields, MD   CH/MEDQ  D:  05/07/2009  T:  05/07/2009  Job:  [id number]

## 2010-08-24 NOTE — Cardiovascular Report (Signed)
[name], [name]               ACCOUNT NO.:  [id number]   MEDICAL RECORD NO.:  [id number]          PATIENT TYPE:  INP   LOCATION:  2002                         FACILITY:  [facility]   PHYSICIAN:  Everardo Beals. Juanda Chance, MD, FACCDATE OF BIRTH:  06-Nov-1929   DATE OF PROCEDURE:  01/28/2008  DATE OF DISCHARGE:                            CARDIAC CATHETERIZATION   CLINICAL HISTORY:  [name] is 77 years old and has a long history of  hypertension, diabetes, and chronic atrial fibrillation.  She was  admitted to the hospital with nausea and vomiting and a rapid  ventricular response to atrial fibrillation.  Her electrocardiogram  showed marked ST changes associated with the tachycardia.  Her troponins  were negative.  She was transferred here for further evaluation with  catheterization.   PROCEDURE:  The procedure was performed via the femoral artery using  arterial sheath and 5-French preformed coronary catheters.  A front wall  arterial puncture was performed and Omnipaque contrast was used.  The  subclavian artery was injected to assess the intramammary artery  suitability for bypass grafting.  Distal aortogram was performed to  evaluate for renovascular causes of hypertension and for possible  placement of intra-aortic balloon pump later.  The patient tolerated the  procedure well and left the laboratory in satisfactory condition.   RESULTS:  The aortic pressure was 166/53 with a mean of 84 and left  ventricular pressure was 166/12.   Left main coronary artery:  The left main coronary artery had a 90%  ostial lesion extending about two-thirds the distance of the left main  and ending just before the takeoff of the LAD and circumflex.   Left anterior descending artery:  The left anterior descending artery  was diffusely diseased in its proximal portion with about 40% diffuse  narrowing extending from the ostium about 30 mm.  There also was some  calcification.  The LAD gave rise to 2  diagonal branches, a large septal  perforator and then a smaller diagonal branch.  Distal vessel was free  of major obstruction, but was irregular.   Circumflex artery:  The circumflex artery was a large dominant vessel  that gave rise to a large marginal branch and a posterolateral and  posterior descending branch.  There was also a small marginal branch  which arose just after the ostium.  The second marginal branch which was  a moderately large vessel had 80% proximal stenosis.  The rest of the  vessels were free of significant disease.   Right coronary artery:  The right coronary artery was a nondominant  vessel despite 2 right ventricle branches.  There was 50% ostial  stenosis.   Left ventriculogram:  The left ventriculogram performed in the RAO  projection showed good wall motion with no areas of hypokinesis.  The  estimated fraction was 60%.   Distal aortogram:  A distal aortogram was performed which showed 95%  stenosis in the right renal artery.  Left renal artery appeared to be  free of major obstruction.  There was some slight ectasia in the distal  aorta and irregularities, but  no definite aneurysm and no major  obstruction.   Subclavian injection:  The subclavian injection showed a patent  subclavian artery and patent internal mammary artery.   CONCLUSION:  1. Coronary artery disease with 90% narrowing in the ostium of the      left main coronary artery, 40% narrowing in the proximal left      anterior descending artery, 80% narrowing in the second marginal      branch of the circumflex artery, and 50% narrowing in the small      nondominant right coronary with normal LV function.  2. A 95% right renal artery stenosis.   RECOMMENDATIONS:  We will obtain a surgical consultation for bypass  surgery.  The patient does have comorbidities of diabetes, hypertension,  chronic atrial fibrillation, and rheumatoid arthritis for which she is  taking methotrexate and she is  77 years old.  If she is felt to be too  high risk for surgical intervention, PCI of the unprotected left main  could be considered.      Bruce Elvera Lennox Juanda Chance, MD, [facility]  Electronically Signed     BRB/MEDQ  D:  01/28/2008  T:  01/28/2008  Job:  [id number]   cc:   Erasmo Downer, MD  Learta Codding, MD,FACC

## 2010-08-24 NOTE — H&P (Signed)
NAMELAKAYA, TOLEN               ACCOUNT NO.:  [id number]   MEDICAL RECORD NO.:  [id number]          PATIENT TYPE:  INP   LOCATION:  2028                         FACILITY:  [facility]   PHYSICIAN:  Quita Skye. Hart Rochester, M.D.  DATE OF BIRTH:  April 16, 1929   DATE OF ADMISSION:  09/01/2008  DATE OF DISCHARGE:                              HISTORY & PHYSICAL   CHIEF COMPLAINT:  Abdominal pain with chronic mesenteric ischemia.   HISTORY OF PRESENT ILLNESS:  This 77 year old female patient has been  having vague abdominal symptoms for the last several weeks, which have  become worse recently.  She was admitted to [facility] one week  ago and was hospitalized for several days with some improvement in her  symptomatology, was discharged 4 days ago, developed abdominal symptoms  the following day, 3 days ago and today returns to the [facility]  Emergency Department.  She denies any diarrhea or nausea or vomiting,  but states that she gets abdominal discomfort following eating meals and  has done so for several weeks.  She has lost about 20 pounds because of  her poor appetite and postprandial pain.  She was known to have some  degree of mesenteric occlusive disease when evaluated by Dr. Darrick Penna  following coronary artery bypass grafting in October 2009.   PAST MEDICAL HISTORY:  1. Coronary artery disease.  2. Hypertension.  3. Chronic atrial fibrillation.  4. Hyperlipidemia.  5. Type 2 diabetes mellitus.  6. Hyperthyroidism.  7. Renal artery stenosis.  8. Rheumatoid arthritis.   PREVIOUS SURGERIES:  1. Coronary artery bypass grafting in October 2009.  2. Embolus to right lower extremity in the postoperative period,      October 2009 requiring right femoral and popliteal embolectomy and      fasciotomy.  3. Bilateral knee replacements.  4. Laparoscopic appendectomy.  5. Closure fasciotomy wounds, right leg.   ALLERGIES:  None known.   MEDICATIONS:  1. Amiodarone 200 mg once a day.  2. Amlodipine 10 mg once a day.  3. Clonidine 0.2 mg twice a day.  4. Coumadin 3 mg daily.  5. Levothyroxine 50 mcg once a day.  6. Metoprolol tartrate 25 mg once a day.  7. Potassium chloride 20 mEq once a day.  8. Pravastatin 40 mg once a day.  9. Demadex oral 20 mg once a day.  10.Clonazepam 0.5 mg at bedtime.  11.Colace 100 mg as needed.  12.Metanx oral once daily.  13.Ciprofloxacin 500 mg b.i.d.  14.Aspirin 81 mg daily.  15.Lisinopril 20 mg daily.   SOCIAL HISTORY:  The patient lives in [place] alone.  She is retired.  Does not smoke, does not use alcohol or drugs, and takes no herbal  medications.   FAMILY HISTORY:  Mother and father noncontributory.  Sister has coronary  artery disease.   PHYSICAL EXAMINATION:  VITAL SIGNS:  Blood pressure 120/60, heart rate  is 53, respirations 14, temperature 98.  GENERAL:  She is an elderly female in no apparent distress.  Alert and  oriented x3.  NECK:  Supple, 3+ carotid pulses palpable.  No bruits  are audible.  No  palpable adenopathy in the neck.  NEUROLOGIC:  Normal.  CHEST:  Clear to auscultation.  CARDIOVASCULAR:  An irregular rhythm.  No murmurs.  ABDOMEN:  Soft, nontender.  There is no rebound tenderness noted.  LOWER EXTREMITIES:  3+ femoral, popliteal, and dorsalis pedis pulses  bilaterally.   CT angiogram was performed in the [facility] Emergency Department revealing  the following findings:  1. Total occlusion superior mesenteric artery.  2. High-grade stenosis celiac axis.  3. Patent inferior mesenteric artery filling the superior mesenteric      artery by collaterals.  4. Small abdominal aortic aneurysm, stable 3 cm in diameter.   IMPRESSION:  Chronic mesenteric ischemia with recent increase in  abdominal discomfort.   PLAN:  To admit the patient, reverse the Coumadin, obtain an angiogram,  and determine best plan for possible revascularization to hopefully  prevent bowel infarction.      Quita Skye Hart Rochester,  M.D.  Electronically Signed     JDL/MEDQ  D:  09/01/2008  T:  09/02/2008  Job:  [id number]   cc:   Kari Baars, M.D.  Learta Codding, MD,FACC  Doreen Beam, MD

## 2010-08-24 NOTE — Op Note (Signed)
[name], [name]               ACCOUNT NO.:  [id number]   MEDICAL RECORD NO.:  [id number]          PATIENT TYPE:  INP   LOCATION:  3310                         FACILITY:  [facility]   PHYSICIAN:  [name]. Fields, MD  DATE OF BIRTH:  August 01, 1929   DATE OF PROCEDURE:  02/06/2008  DATE OF DISCHARGE:                               OPERATIVE REPORT   PROCEDURE:  Right anterior and lateral compartment fasciotomy.   PREOPERATIVE DIAGNOSIS:  Compartment syndrome, right leg.   POSTOPERATIVE DIAGNOSIS:  Compartment syndrome, right leg.   ANESTHESIA:  Local with IV sedation.   OPERATIVE FINDINGS:  Edematous muscle right anterior and lateral  compartment, viable, but edematous muscle.   OPERATIVE DETAILS:  After obtaining informed consent, the patient was  taken to the operating room.  The patient placed in supine position on  operating room table.  After adequate sedation, the patient's entire  right lower extremity was prepped and draped in usual sterile fashion.  Local anesthesia in the form of 0.25% Marcaine and 1% lidocaine with  epinephrine was infiltrated on the lateral aspect of the right leg.  Longitudinal incision was made just anterior to the fibula.  Incision  was carried down through the subcutaneous tissues.  The anterior and  lateral compartments were then incised, down below the level of the  incision and above the level of the incision, so that the compartments  were fully decompressed.  The anterior and lateral compartment muscles  were edematous, but viable with cautery stimulation.  Hemostasis was  obtained.  A saline moistened gauze dressing was applied to the lower  extremity.  The patient still had a palpable dorsalis pedis  Doppler  signal at the end of the case.  The patient tolerated the procedure well  and there were no complications.  Instruments, sponge, and needle counts  were correct at the end of the case.  The patient was taken to the  recovery room in stable  condition.      [name]. Fields, MD  Electronically Signed     CEF/MEDQ  D:  02/06/2008  T:  02/06/2008  Job:  [id number]

## 2010-08-24 NOTE — Assessment & Plan Note (Signed)
OFFICE VISIT   [name], [name]  DOB:  12-29-1929                                       10/29/2008  EAVWU#:[id number]   The patient is a 77 year old female who previously underwent embolectomy  of her right leg and also recently celiac artery stenting.  She had  elevated velocities on a mesenteric duplex scan last week and returns  this week after evaluation with CT angio.  Also she complains of a  chronic cough which has been persistent since January.   PHYSICAL EXAM:  Today blood pressure is 158/70 in the left arm, pulse is  68 and regular.  Abdomen is soft, nontender, nondistended.   CT scan of the abdomen and pelvis reveals a 25-50% origin stenosis of  the celiac artery.  This is calcified but the artery does appear widely  patent with a small amount of poststenotic dilatation.  Otherwise the  SMA is as previously noted, chronically occluded at its origin with  reconstitution a few centimeters distally.  Overall the CT scan is  otherwise fairly unremarkable.   As far as her cough is concerned I listened to her lung fields today and  they sounded fairly clear bilaterally with slightly decreased sounds in  the left base.  We did obtain a PA and lateral chest x-ray today which  showed some left lower lobe collapse and atelectasis.  This was compared  to her chest CT scan from January which showed similar findings.  I  discussed this with Dr. Shelle Iron from [facility] and he has agreed  to see the patient to consider whether or not bronchoscopy might be  necessary to rule out endobronchial lesion versus mucus plugging.  I  called the patient this afternoon and discussed these findings with her  as well.  She will follow up with [place] in six months' time for a repeat  mesenteric duplex scan or sooner if she develops symptoms of weight loss  or postprandial pain.  She will follow up with Dr. Shelle Iron for evaluation  of her pulmonary function as scheduled by  his office.   [name]. Fields, MD  Electronically Signed   CEF/MEDQ  D:  10/30/2008  T:  10/30/2008  Job:  [id number]   cc:   Barbaraann Share, MD,FCCP  Erasmo Downer, MD  Everardo Beals. Juanda Chance, MD, [facility]  V. Charlena Cross, MD

## 2010-08-24 NOTE — Assessment & Plan Note (Signed)
OFFICE VISIT   [name], [name]  DOB:  1929-08-21                                        March 18, 2008  CHART #:  [id number]   HISTORY:  The patient is a 77 year old female who was hospitalized from  01/27/2008 to 02/22/2008.  She was transferred initially from [facility] where she was admitted approximately 5 days earlier for upper  abdominal pain.  She was found to have rapid atrial fibrillation as well  as ST-segment depression.  The patient had negative cardiac enzymes and  no pulmonary edema on chest x-ray.  She was treated for the atrial  fibrillation and CT scan of the abdomen was negative for acute findings  and she was felt to require further evaluation of her cardiac status.  She was transferred to [facility] where she underwent cardiac  catheterization and was found to have significant coronary artery  disease necessitating coronary artery bypass grafting.  She has  tolerated that procedure well, but postoperatively has significant  difficulties with right lower extremity embolic event requiring  embolectomy as well as fasciotomy.  Additionally, she had postoperative  difficulties with mesenteric ischemia due to significant blockages in  her celiac and SMA systems.  She required a course of TNA and bowel  rest, but ultimately was discharged on 02/22/2008 to [facility].  Currently, she reports that she is doing quite well.  She is  ambulating with a cane.  She denies shortness of breath.  She is using  no pain medication.  She is tolerating therapies.  Her wound from her  fasciotomy is the only current issue and this is healing quite nicely.  She has followed up with both, Dr. Juanda Chance and Dr. Darrick Penna  postoperatively.   CHEST X-RAY:  A chest x-ray was obtained on today's date.  It reveals  probable paralysis of the left hemidiaphragm.  Otherwise, it does not  show any significant effusions or infiltrates.   PHYSICAL  EXAMINATION:  GENERAL:  Alert, elderly female in no acute  distress.  LUNGS:  Clear bilaterally, slightly diminished in the left base.  CARDIAC:  Regular rate and rhythm without gallops or rubs or murmurs.  ABDOMEN:  Soft, nontender, and nondistended.  Positive bowel sounds.  EXTREMITIES:  A 2+ bilateral lower extremity edema.  Incisions are all  inspected.  The right calf fasciotomy site is healing well with good  granulation tissue.  There is some serous exudate.  The wound was clean  with saline and dressing was reapplied.   ASSESSMENT:  The patient continues to make terrific progress following  her significant hospitalization as described above.  We recommend that  she continue her stay in the nursing/rehabilitation facility until next  week when she can be discharged with her family for further  convalescence at a family member's home and then ultimately hopefully  return to her independence for which she lives alone.  We will plan to  see her again on a p.r.n. basis if she has any further cardiac, surgical  related issues.   Rowe Clack, P.A.-C.   Sherryll Burger  D:  03/18/2008  T:  03/18/2008  Job:  [id number]   cc:   Everardo Beals. Juanda Chance, MD, [facility]  Lorne Skeens. Hoxworth, M.D.  Kerin Perna, M.D.  [name]. Darrick Penna, MD  Erasmo Downer,  MD

## 2010-08-24 NOTE — Consult Note (Signed)
NAMEELIANIE, [name]               ACCOUNT NO.:  [id number]   MEDICAL RECORD NO.:  [id number]          PATIENT TYPE:  INP   LOCATION:  2016                         FACILITY:  [facility]   PHYSICIAN:  Quita Skye. Hart Rochester, M.D.  DATE OF BIRTH:  03/20/1930   DATE OF CONSULTATION:  02/04/2008  DATE OF DISCHARGE:                                 CONSULTATION   REFERRING PHYSICIAN:  Kerin Perna, MD   CHIEF COMPLAINT:  Cool right foot with occasional numbness.   HISTORY OF PRESENT ILLNESS:  I was asked to evaluate this 77 year old  female diabetic nonsmoker who underwent coronary artery bypass grafting  7 days ago by Dr. Kathlee Nations Trigt because of the onset of some cool  sensation in the right foot with some discomfort initially, which  occurred at 4 a.m. today.  She denies any history of claudication or  similar symptoms in the right leg.  She states that the foot now feels  better, and she does have feeling in the foot.  She states in no pain,  but it does seem cooler than the other according to her.  She has a  history of rapid atrial fibrillation with chronic Coumadin therapy.  She  was in rapid atrial fib when admitted to the [facility] prior to her  coronary artery bypass grafting and she has been on Coumadin for 6  years.  Her ABIs were 1.0 preoperatively and today, ABIs were checked  and were 1.0 on the left and 0.1-0.2 on the right.   PAST MEDICAL HISTORY:  1. Hypertension.  2. Atrial fibrillation for 6 years on Coumadin therapy.  3. Rheumatoid arthritis.  4. Diabetes mellitus.  5. Hypothyroidism.  6. History of GI bleed secondary to colonic ulcerations.   PAST SURGICAL HISTORY:  1. Bilateral knee replacements.  2. Appendectomy.  3. Coronary artery bypass grafting.  4. Tubal ligation.   ALLERGIES:  None known.   MEDICATIONS:  Please see health history form and chart include Coumadin  5 mg daily.   SOCIAL HISTORY:  She lives alone, is active, does not use tobacco or  alcohol.   FAMILY HISTORY:  Negative for coronary artery disease, diabetes, or  stroke.   REVIEW OF SYSTEMS:  Denies any current abdominal pain, postprandial  pain, weight loss, or chest pain.  No history of claudication symptoms  or rest pain.  Denies orthopnea, but does have dyspnea on exertion.   PHYSICAL EXAMINATION:  VITAL SIGNS:  Blood pressure 130/80, heart rate  70, and respirations 14.  GENERAL:  She is an elderly female in no apparent distress.  Alert and  oriented x3.  NECK:  Supple, 3+ carotid pulses palpable.  No palpable adenopathy in  the neck.  NEUROLOGIC:  Normal.  CHEST:  Clear to auscultation.  CARDIOVASCULAR:  Irregular rhythm.  No murmur.  ABDOMEN:  Soft and nontender with no masses.  EXTREMITIES:  Left leg has 3+ femoral, popliteal, and dorsalis pedis  pulse palpable.  Right leg has a 3+ femoral with absent popliteal and  distal pulses.  Right foot is slightly cooler than the left,  but does  have intact motion sensation.  No calf tenderness.   IMPRESSION:  1. Probable right superficial femoral embolus, 18 hours ago, secondary      to atrial fibrillation.  2. Coronary artery disease status post coronary artery bypass grafting      1 week ago.  3. Hypertension.   PLAN:  To hold the Coumadin therapy.  The patient has eaten dinner  tonight.  We will proceed with an angiogram in the morning to confirm  the diagnosis.  We will then follow in the following 24 hours with a  right femoral or popliteal embolectomy depending on the location of the  problem if in fact this is an embolus.  I have discussed this at length  with her and her daughters, who are in agreement with the plan.  If her  symptoms worsen, we will proceed sooner than that, but her leg is stable  at this time.      Quita Skye Hart [place], M.D.  Electronically Signed     JDL/MEDQ  D:  02/04/2008  T:  02/05/2008  Job:  [id number]

## 2010-08-24 NOTE — Op Note (Signed)
NAMESENDY, [name]               ACCOUNT NO.:  [id number]   MEDICAL RECORD NO.:  [id number]          PATIENT TYPE:  INP   LOCATION:  3310                         FACILITY:  [facility]   PHYSICIAN:  Juleen China IV, MDDATE OF BIRTH:  November 10, 1929   DATE OF PROCEDURE:  02/05/2008  DATE OF DISCHARGE:                               OPERATIVE REPORT   PREOPERATIVE DIAGNOSIS:  Right leg ischemia.   POSTOPERATIVE DIAGNOSIS:  Right leg ischemia.   PROCEDURE PERFORMED:  1. Ultrasound-guided access to left common femoral artery.  2. Abdominal aortogram.  3. Right lower extremity runoff.  4. Second-order catheterization.  5. Closure device (StarClose).   INDICATIONS:  This is a 77 year old female on the cardiac surgery  service who had normal ankle-brachial indices prior to her procedure and  began complaining of right leg pain yesterday.  Her ankle-brachial  indices had dropped to 0.2.  Her foot is viable.  She has a history of  atrial fibrillation.  She comes in today for diagnostic catheterization.   DESCRIPTION OF PROCEDURE:  The patient was identified in the holding  area and taken to room 8, she was placed supine on the table.  Bilateral  groins were prepped and draped in standard sterile fashion.  A time-out  was called.  The left femoral artery was evaluated with ultrasound and  was found to be widely patent.  Lidocaine 1% was used for local  anesthesia.  The left common femoral artery was accessed with a  micropuncture needle under ultrasound guidance.  An 0.018 mandrel wire  was advanced into the iliac arterial system under fluoroscopic  visualization.  Next, a stiff micropuncture sheath was placed.  0.018  wire was removed and 0.035 Bentson wire was placed.  Micropuncture  sheath was removed, and a 5-French sheath was placed.  Over the wire, an  Omni flush catheter was placed at the level of L1 and abdominal  aortogram was obtained.  Next, catheter was pulled down to the pelvis   and pelvic angiogram was obtained.  Using the Omni flush catheter and  Bentson wire, the aortic bifurcation was crossed and catheter was placed  in the right external iliac artery and right lower extremity runoff was  obtained.   FINDINGS:  Aortogram:  The visualized portions of the suprarenal  abdominal aorta showed minimal disease.  There is a high-grade stenosis  at the origin of the right renal artery.  The left renal artery is  patent.  There is mild ectasia at the infrarenal abdominal aorta.  Bilateral common iliac arteries and external iliac arteries are widely  patent.  Bilateral hypogastric arteries are patent.   Right Lower Extremity:  The right common femoral artery is widely  patent.  The right profunda femoral artery is widely patent.  The right  superficial femoral artery is occluded in its mid portion.  There is  reconstitution of popliteal artery in the adductor canal.  The patient  had single vessel runoff near the peroneal artery, which collateralized  to the dorsalis pedis in the foot.   After the above images were obtained, decision  made to terminate the  procedure.  Catheters and wires were removed.  StarClose was  successfully deployed.   IMPRESSION:  1. Right renal artery stenosis.  2. Right superficial femoral artery occlusion.           ______________________________  V. Charlena Cross, MD  Electronically Signed     VWB/MEDQ  D:  02/05/2008  T:  02/05/2008  Job:  [id number]

## 2010-08-24 NOTE — Op Note (Signed)
[name], [name]               ACCOUNT NO.:  [id number]   MEDICAL RECORD NO.:  [id number]         PATIENT TYPE:  CINP   LOCATION:                               FACILITY:  [facility]   PHYSICIAN:  [name]. Fields, MD  DATE OF BIRTH:  14-Jun-1929   DATE OF PROCEDURE:  02/05/2008  DATE OF DISCHARGE:                               OPERATIVE REPORT   PROCEDURES:  1. Left superficial femoral artery embolectomy.  2. Right superficial femoral artery embolectomy.  3. Right anterior tibial artery embolectomy.  4. Right tibioperoneal trunk endarterectomy.  5. Patch angioplasty of right popliteal artery and tibioperoneal      trunk.  6. Intraoperative arteriogram x2.   PREOPERATIVE DIAGNOSIS:  Acute ischemia, right lower extremity.   POSTOPERATIVE DIAGNOSIS:  Acute ischemia, right lower extremity.   ANESTHESIA:  General.   ASSISTANT:  Wilmon Arms, PA   OPERATIVE FINDINGS:  1. Chronic subtotal occlusion, right tibioperoneal trunk.  2. Less than 1-mm right peroneal artery.  3. One-vessel runoff via anterior tibial artery.   SPECIMENS:  Embolic material, right anterior tibial artery and right  superficial femoral artery.   OPERATIVE DETAILS:  After obtaining informed consent, the patient was  taken to the operating room.  The patient was placed in supine position  in operating table.  After adequate sedation, the patient's entire right  lower extremity was prepped and draped in usual sterile fashion.  Local  anesthesia was infiltrated over the right common femoral artery.  A  longitudinal incision was made in the right groin over this location.  Incision was carried down through the subcutaneous tissues down the  level of right common femoral artery.  The common femoral artery was  dissected free circumferentially.  Vessel loops placed around this.  The  superficial femoral profunda femoris artery was dissected free  circumferentially.  There appeared to be an additional large  deep  femoral artery coming off more proximally and a vessel loop was placed  around this as well.  The patient was then given 7000 units of  intravenous heparin.  Common femoral artery was controlled proximally  with vessel loop.  Profunda was controlled with a vessel loop as well.  A transverse arteriotomy was then made just above the femoral  bifurcation.  The #4 Fogarty catheter was used to thrombectomized the  left superficial femoral artery.  Preoperative arteriogram had shown  that this was occluded.  The profunda was found be patent as well as the  common femoral artery on preoperative arteriogram.  A large amount of  thrombotic material was removed from the right superficial femoral  artery.  However, the catheter was only passed down 50 cm in length and  would not pass all the way to the foot.  After multiple clean passes  were made, the arteriotomy was repaired with interrupted 6-0 Prolene  sutures.  An intraoperative arteriogram was then obtained with inflow  occlusion.  A 21-gauge butterfly needle was introduced into the proximal  right superficial femoral artery.  Arteriogram shows a patent right  superficial popliteal artery with minimal outflow and high-grade  stenosis versus occlusion of the right popliteal artery.  At this point,  a general anesthetic was commenced.  Next, on the medial aspect of the  right leg, a longitudinal incision was made, carried down through  subcutaneous tissues down to the level of popliteal space.  The  popliteal artery was dissected free circumferentially.  It was pulsatile  in quality.  The anterior tibial artery and tibioperoneal trunks were  both dissected free circumferentially and vessel loops were placed  around these.  The tibioperoneal trunk was heavily calcified.  Next, a  longitudinal opening was made in the popliteal artery with a vessel loop  used for proximal control.  Longitudinal arteriotomy was extended down  to the  tibioperoneal trunk.  There was some backbleeding from this;  however, there was a large amount of atherosclerotic plaque and the  lumen had essentially been completely obliterated.  An attempt was made  to perform an endarterectomy of this segment; however, the calcified  plaque continued more distally and it was decided that this was most  likely chronically occluded, not salvageable.  The adjacent peroneal  artery was dissected free circumferentially and an attempt was made to  pass a 1.5-mm dilator down this and it would not accept it.  There was  minimal backbleeding from this.  Next, attention was turned to the  anterior tibial artery.  A #3 Fogarty catheter was passed down the  anterior tibial artery all the way to the level foot.  This passed down  for a total of 50 cm.  Catheter was pulled back and there was a large  amount of thrombus removed.  There was good backbleeding from the  anterior tibial artery at this point.  Catheter was passed multiple  times until two clean passes were obtained.  This was then recontrolled  with a vessel loop.  Next, a piece of saphenous vein was harvested from  the more posterior section of the right medial calf; however, the vein  was of poor quality and a quite small diameter.  Therefore, a bovine  pericardial patch was brought to the operative field and this was sewn  onto the distal popliteal artery and proximal tibioperoneal trunk using  a running 7-0 Prolene suture.  Just prior to completion of anastomosis,  this was fore bled, back bled, and thoroughly flushed.  Anastomosis was  secured.  Vessel loops released.  There was pulsatile flow in the  popliteal artery immediately.  There was good Doppler flow in the  anterior tibial artery.  The patient also had restoration of a dorsalis  pedis pulse, which was palpable.  An additional intraoperative  arteriogram was done with inflow occlusion.  Again, a 21-gauge butterfly  needle was introduced  into the right superficial femoral artery.  This  then showed a patent popliteal artery with a patent anterior tibial  artery all the way to the mid calf.  At this point, the needle was  removed and the hole repaired with a single 7-0 Prolene suture.  Hemostasis was obtained in both incisions.  It should be noted that the  patient was given an additional 5000 units of heparin bolus prior to  occluding the popliteal artery below the knee and the patient was also  given an additional 3000 units of heparin during the patch angioplasty  portion of the case.  After hemostasis had been obtained in both  incisions, the deep layers were closed with running 2-0 Vicryl sutures.  The subcutaneous layer was closed  with running 3-0 Vicryl suture.  Skin  was closed staples.  The posterior superficial and deep compartments  were soft in character.  The anterior compartments on palpation also  appears soft in character and did not seem that the patient did a  fasciotomy at this time.  The patient tolerated the procedure well and  there were no complications.  Instruments, sponge, and needle count was  correct at the end of case.  The patient was taken to the recovery room  in stable condition.      [name]. Fields, MD  Electronically Signed     CEF/MEDQ  D:  02/05/2008  T:  02/06/2008  Job:  [id number]

## 2010-08-24 NOTE — Assessment & Plan Note (Signed)
OFFICE VISIT   [name], [name]  DOB:  [birth year]                                       07/23/2008  ZOXWR#:[id number]   The patient returns for follow-up today.  She was last seen in March.  At that time she had an area of opening at her medial fasciotomy sites  which was having some drainage.  These were both opened and wound care  was commenced.  She returns today for further follow-up.  She states  that she is having essentially no drainage from the wounds at this  point.  One of the sinus tracts is now completely healed.  The other one  now has a small area of eschar but is 1 cm in diameter.  There is no  surrounding erythema.  She does have bilateral lower extremity edema  which is more significant than previously.  She is on a diuretic for  this.   Overall, the patient's legs appear well.  Her feet are warm and well-  perfused bilaterally.  I did discuss with the lower edema that she may  need some adjustment of her diuretic.  She will follow up with her  primary care doctor for this.  She will follow up in 3 months' time for  repeat ABIs.  She will continue to place a small dressing over this  until it has completely healed.   Cheryl Hora. Fields, MD  Electronically Signed   CEF/MEDQ  D:  07/23/2008  T:  07/24/2008  Job:  [id number]   cc:   Erasmo Downer, MD  Everardo Beals. Juanda Chance, MD, [facility]

## 2010-08-24 NOTE — Assessment & Plan Note (Signed)
OFFICE VISIT   MURL, ZOGG  DOB:  06/04/1929                                       03/25/2010  ZOXWR#:[id number]   HISTORY OF PRESENT ILLNESS:  The patient is a 77 year old female well-  known to me.  She has multiple chronic medical problems including  history of congestive heart failure and chronic mesenteric ischemia.  She also has had thromboembolic disease from atrial fibrillation in the  past and previously has required thrombectomy and fasciotomies.  Most  recently she was in the hospital for upper GI bleeding.  She had an  endoscopy which showed several ulcers on the greater curve of the  stomach.  These were though to possibly be ischemic in nature.  She was  treated conservatively and now feels better.  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   PHYSICAL EXAM:  Today blood pressure is 126/83 in the left arm, heart  rate is 99 and regular, oxygen saturation is 95% on room air.  Abdomen:  Soft, mildly tender diffusely.  She has no bruits.  Extremities:  She  has a palpable nodule of her right Achilles tendon that she wanted me to  look at.  This looks like probably just related to the tendon and no  tumor or any type of DVT.   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  She had a >75% stenosis of  her celiac stent by velocity criteria.   I had a lengthy discussion with the patient and her daughter today.  She  is not a very good operative candidate.  She would require replacement  of her aorta as well as superior mesenteric artery bypass for  improvement of her disease overall.  Certainly her recent symptoms could  be ischemic in nature although it could be related more just to regular  bland ulcerative disease.  I discussed with her today that her chances  of making it through an open  operation would be high risk for multiple  complications.  However, I also discussed with her that if she occludes  her celiac stent and infarcts it would probably be a fatal incident.  I  wanted to have a discussion with them today regarding whether or not a  plan would be in place to go ahead and proceed with an operation at this  point since she is in a relatively healthy state versus whether or not  to not treat this if she presents with acute occlusion and infarcted  bowel.  She and her daughter will discuss these options and will return  in mid January to determine whether or not they wish to consider an  operation or wish to consider not treating this mesenteric phenomena any  more at this point.     [name]. Fields, MD  Electronically Signed   CEF/MEDQ  D:  03/25/2010  T:  03/26/2010  Job:  [id number]   cc:   Kirstie Peri, MD  Iva Boop, MD,FACG

## 2010-08-24 NOTE — Op Note (Signed)
[name], [name]               ACCOUNT NO.:  [id number]   MEDICAL RECORD NO.:  [id number]          PATIENT TYPE:  INP   LOCATION:  2609                         FACILITY:  [facility]   PHYSICIAN:  Doylene Canning. Ladona Ridgel, MD    DATE OF BIRTH:  [birth year]   DATE OF PROCEDURE:  09/09/2008  DATE OF DISCHARGE:                               OPERATIVE REPORT   PROCEDURE PERFORMED:  Electrophysiologic study and RF catheter ablation  of atrial flutter.   INTRODUCTION:  The patient is a 77 year old woman with a history of  bradycardia and severe peripheral vascular disease, who subsequently  found to have incessant sustained atrial flutter with 2:1 AV conduction  and a ventricular rate of about 120 beats per minute despite medical  therapy.  She has been on heparin and now Coumadin since developing  atrial flutter and is now referred for catheter ablation of her  symptomatic medically refractory atrial flutter.   PROCEDURE:  After informed consent was obtained, the patient was taken  to the diagnostic EP lab in a fasting state.  After usual preparation  and draping, a 5-French quadripolar catheter was inserted percutaneously  in the right femoral vein and advanced to the His bundle region.  A 7-  French quadripolar ablation catheter was inserted percutaneously in the  right femoral vein and advanced under fluoroscopic guidance to the right  atrium.  A 6-French hexapolar EP catheter was inserted percutaneously in  the right jugular vein and advanced to the coronary sinus.  Mapping was  carried out.  This demonstrated the typical atrial flutter activation  sequence.  Entrainment mapping was also carried out demonstrating  concealed entrainment in the atrial flutter isthmus with post pacing  interval equal to the atrial flutter cycle length when pacing from the  tricuspid valve annulus os.  At this point, the ablation catheter was  maneuvered into the atrial flutter isthmus.  Two RF energy applications  were delivered, which resulted in termination of atrial flutter and  restoration of sinus rhythm.  Pacing was then carried out and 4  additional RF energy applications were delivered resulting in the  creation of bidirectional isthmus block.  At this point, rapid  ventricular pacing was carried out demonstrating VA dissociation at 600  msec.  Programmed ventricular stimulation was also carried out  demonstrating VA dissociation at 600 msec.  Rapid atrial pacing was  carried out from the coronary sinus and the high right atrium at base  drive cycle length of 213 msec and stepwise decreased to 380 msec where  AV Wenckebach was observed.  During rapid atrial pacing, the PR interval  was less than the RR interval and there was no inducible SVT.  Next,  programmed atrial stimulation was carried out the coronary sinus and the  high right atrium at base drive cycle length of 086 msec and the S1-S2  interval stepwise decreased from 440 msec down to 260 msec where the AV  node ERP was observed.  During programmed atrial stimulation, there were  no AH jumps and no echo beats and no inducible SVT.  At this  point, the  patient was observed for a total of 30 minutes and had no recurrent  atrial flutter isthmus conduction, and the catheters were removed.  The  patient was returned to the holding area in satisfactory condition.   COMPLICATIONS:  There were no immediate procedure complications.   RESULTS:  A.  Baseline ECG.  Baseline ECG demonstrates 2:1 atrial  flutter, cycle length was about 280 msec.  B.  Baseline intervals.  The HV interval was 41 msec.  The QRS duration  was 110 msec.  The atrial flutter cycle length was 280 msec.  Following  restoration of sinus rhythm, there was no change in the HV interval.  C.  Rapid ventricular pacing.  Rapid ventricular pacing following  ablation demonstrated VA dissociation at 600 msec.  D.  Programmed ventricular stimulation.  Programmed ventricular   stimulation was carried out from the RV apex at base drive cycle length  of 161 msec demonstrating VA dissociation.  E.  Rapid atrial pacing.  Rapid atrial pacing was carried out following  ablation demonstrating an AV Wenckebach cycle length of 380 msec.  During rapid atrial pacing, the PR interval was less than the RR  interval and there was no inducible SVT.  F.  Programmed atrial stimulation.  Programmed atrial stimulation was  carried out from the coronary sinus and the high right atrium at base  drive cycle length of 096 msec and stepwise decreased from 440 msec down  to 260 msec where the AV node ERP was observed.  During programmed  atrial stimulation, there were no AH jumps and no echo beats and no  inducible SVT.  G.  Arrhythmias observed.  1. Atrial flutter initiation at the present time of EP study, the      duration was sustained, cycle length was 282 msec.  Method of      termination was catheter ablation.      a.     Mapping.  Mapping of atrial flutter demonstrated usual size       and orientation of the atrial flutter isthmus, although the heart       was rotated somewhat rightwardly.      b.     RF energy application.  A total of 6 RF energy applications       were delivered resulting in termination of atrial flutter,       restoration of sinus rhythm, and creation of bidirectional atrial       flutter isthmus block.   CONCLUSION:  Study demonstrates successful electrophysiology study and  RF catheter ablation of typical atrial flutter with a total of 6 RF  energy applications delivered to the atrial flutter isthmus resulting in  termination of flutter, restoration of sinus rhythm, creation of  bidirectional block and atrial flutter isthmus.      Doylene Canning. Ladona Ridgel, MD  Electronically Signed     GWT/MEDQ  D:  09/09/2008  T:  09/09/2008  Job:  [id number]   cc:   Kari Baars, M.D.  Learta Codding, MD,FACC  Doreen Beam, MD

## 2010-08-24 NOTE — Op Note (Signed)
[name], [name]               ACCOUNT NO.:  [id number]   MEDICAL RECORD NO.:  [id number]          PATIENT TYPE:  INP   LOCATION:  3310                         FACILITY:  [facility]   PHYSICIAN:  Kerin Perna, M.D.  DATE OF BIRTH:  10-05-1929   DATE OF PROCEDURE:  02/08/2008  DATE OF DISCHARGE:                               OPERATIVE REPORT   OPERATION:  Placement of left subclavian vein triple-lumen central  venous catheter.   PREOPERATIVE DIAGNOSES:  Status post coronary artery bypass graft in  need of central venous access.   POSTOPERATIVE DIAGNOSIS:  Status post coronary artery bypass graft in  need of central venous access.   SURGEON:  Kerin Perna, MD   ANESTHESIA:  Local 1% lidocaine.   INDICATIONS:  The patient is a 77 year old female 1 week postop urgent  CABG x3 for left main stenosis now with a recent embolectomy of her  right leg and evidence of mesenteric ischemia on CT scan of the abdomen.  She needs central venous medications, T&A nutrition, and multiple  antibiotics and another venous access line was needed after more than 2  attempts to place a peripheral IV were unsuccessful.  I discussed the  indications, benefits, and risks of this procedure with the patient and  she agreed.   PROCEDURE:  The left shoulder was prepped and draped with  hexachlorophene and draped as a sterile field.  Lidocaine 1% was  infiltrated beneath the lateral aspect of the clavicle.  The left  subclavian vein was cannulated with a needle and aspirated blood.  A  guidewire was passed through the needle towards the superior vena cava.  Over the guidewire, a dilator was placed.  Over the guidewire, next the  triple-lumen catheter was placed to an appropriate length and secured to  the skin.  It flushed freely and was flushed with the saline.  A sterile  dressing was applied and a portable chest x-ray was ordered.  There were  no complications noted.      Kerin Perna, M.D.  Electronically Signed     PV/MEDQ  D:  02/08/2008  T:  02/09/2008  Job:  [id number]

## 2010-08-24 NOTE — Procedures (Signed)
LOWER EXTREMITY ARTERIAL EVALUATION-SINGLE LEVEL   INDICATION:  Followup evaluation, status post embolectomy.   HISTORY:  Diabetes:  No  Cardiac:  Coronary artery bypass graft, congestive heart failure .  Hypertension:  Yes  Smoking:  No  Previous Surgery:  Previous right leg fasciotomy with right anterior  tibial and superficial femoral artery embolectomy on February 05, 2008.   RESTING SYSTOLIC PRESSURES: (ABI)                          RIGHT                LEFT  Brachial:               188                  180  Anterior tibial:        200 (greater than 1.0)                    210  Posterior tibial:       120                  210 (greater than 1.0)  Peroneal:  DOPPLER WAVEFORM ANALYSIS:  Anterior tibial:        triphasic            triphasic  Posterior tibial:       monophasic           triphasic  Peroneal:   PREVIOUS ABI'S:  Date:  RIGHT:  LEFT:   IMPRESSION:  Ankle-brachial indices suggest no significant arterial  occlusive disease bilaterally.   ___________________________________________  [name] Fields, MD   MC/MEDQ  D:  10/22/2008  T:  10/22/2008  Job:  [id number]

## 2010-08-24 NOTE — Assessment & Plan Note (Signed)
[facility]                                 ON-CALL NOTE   CHRIS, NARASIMHAN                      MRN:          [id number]  DATE:05/31/2008                            DOB:          18-Feb-1930    PRIMARY CARDIOLOGIST:  Duke Salvia, MD, [facility]   The patient called stating that she was out of her atenolol 25 mg twice  a day.  She went to have it refilled and found that there were no  refills and requested a refill.  I have called in 1 month refill of  atenolol 25 mg b.i.d. with 2 refills and she is to follow up with Dr.  Graciela Husbands for further appointments and refills.      Bettey Mare. Lyman Bishop, NP  Electronically Signed      Noralyn Pick. Eden Emms, MD, [facility]  Electronically Signed   KML/MedQ  DD: 05/31/2008  DT: 06/01/2008  Job #: [id number]

## 2010-12-09 ENCOUNTER — Ambulatory Visit (INDEPENDENT_AMBULATORY_CARE_PROVIDER_SITE_OTHER): Payer: Medicare PPO

## 2010-12-09 DIAGNOSIS — Z48812 Encounter for surgical aftercare following surgery on the circulatory system: Secondary | ICD-10-CM

## 2010-12-09 DIAGNOSIS — K559 Vascular disorder of intestine, unspecified: Secondary | ICD-10-CM

## 2010-12-16 ENCOUNTER — Telehealth: Payer: Self-pay

## 2010-12-16 DIAGNOSIS — I739 Peripheral vascular disease, unspecified: Secondary | ICD-10-CM

## 2010-12-16 DIAGNOSIS — M79609 Pain in unspecified limb: Secondary | ICD-10-CM

## 2010-12-16 NOTE — Telephone Encounter (Signed)
Pt. Called to report having pain w/ walking in calves of legs, that improves w/ rest.  Does state that sometimes has pain at night.  States pain has been occurring about the past year.  States has intermittent numbness in feet.  Denies temperature change in feet.  States feet get deep red at times.  Advised will schedule appt. In next few weeks, and pt. should call office if sx's worsen sooner that appt.  Verbalizes understanding.  Discussed pt's sx's w/ [place] Bailiff and rec'd verbal order to do ABI's prior to MD appt.

## 2010-12-31 ENCOUNTER — Encounter: Payer: Self-pay | Admitting: Vascular Surgery

## 2010-12-31 NOTE — Procedures (Unsigned)
MESENTERIC ARTERIAL DUPLEX EVALUATION  INDICATION:  Celiac artery stent on 09/02/2008.  HISTORY: Diabetes:  Yes. Cardiac:  CABG, CHF. Hypertension:  Yes. Smoking:  No. Other:  Celiac artery stent on 09/02/2008 with known proximal SMA occlusion.  Mesenteric Duplex Findings: Aorta - Proximal                            71 Aorta - Mid                                 85 Aorta - Distal                              75  Celiac Trunk - Proximal                     297/89 Celiac Trunk - Distal                       212/61  Hepatic Artery                              163 Splenic Artery                              Not visualized  Superior Mesenteric Artery-Origin           Occluded Superior Mesenteric Artery-Proximal         233/44 Superior Mesenteric Artery-Mid              127 Superior Mesenteric Artery-Distal           80  Inferior Mesenteric Artery-Proximal         Not visualized  IMPRESSION: 1. Patent celiac artery stent with Doppler velocities suggestive of     >75% stenosis of the celiac artery. 2. Known occlusion of the proximal superior mesenteric artery noted. 3. Aneurysmal dilatation of the distal abdominal aorta of 3.1 x 3.1     cm. 4. Unable to adequately visualize the splenic and inferior mesenteric     arteries due to overlying bowel gas patterns. 5. No significant change noted when compared to the previous exam on     03/25/2010.      ___________________________________________ [name]. Fields, MD  CH/MEDQ  D:  12/10/2010  T:  12/10/2010  Job:  [id number]

## 2011-01-11 ENCOUNTER — Encounter: Payer: Self-pay | Admitting: Vascular Surgery

## 2011-01-11 ENCOUNTER — Other Ambulatory Visit: Payer: Self-pay | Admitting: Lab

## 2011-01-11 DIAGNOSIS — K551 Chronic vascular disorders of intestine: Secondary | ICD-10-CM

## 2011-01-11 LAB — URINALYSIS, ROUTINE W REFLEX MICROSCOPIC
Bilirubin Urine: NEGATIVE
Glucose, UA: NEGATIVE
Glucose, UA: NEGATIVE
Glucose, UA: NEGATIVE
Ketones, ur: NEGATIVE
Ketones, ur: NEGATIVE
Leukocytes, UA: NEGATIVE
Leukocytes, UA: NEGATIVE
Leukocytes, UA: NEGATIVE
Nitrite: NEGATIVE
Protein, ur: 100 — AB
Specific Gravity, Urine: 1.01
pH: 5.5
pH: 7

## 2011-01-11 LAB — CBC
HCT: 23.7 — ABNORMAL LOW
HCT: 24.7 — ABNORMAL LOW
HCT: 26.4 — ABNORMAL LOW
HCT: 29.6 — ABNORMAL LOW
HCT: 30.9 — ABNORMAL LOW
HCT: 31.9 — ABNORMAL LOW
HCT: 32 — ABNORMAL LOW
HCT: 32.6 — ABNORMAL LOW
HCT: 33.1 — ABNORMAL LOW
HCT: 33.2 — ABNORMAL LOW
HCT: 34.5 — ABNORMAL LOW
HCT: 34.5 — ABNORMAL LOW
HCT: 34.7 — ABNORMAL LOW
HCT: 36.5
HCT: 36.5
HCT: 37.5
HCT: 25.6 — ABNORMAL LOW
HCT: 26.4 — ABNORMAL LOW
HCT: 27.2 — ABNORMAL LOW
HCT: 27.9 — ABNORMAL LOW
HCT: 28.8 — ABNORMAL LOW
HCT: 29.5 — ABNORMAL LOW
HCT: 29.8 — ABNORMAL LOW
HCT: 31 — ABNORMAL LOW
HCT: 31 — ABNORMAL LOW
Hemoglobin: 10.6 — ABNORMAL LOW
Hemoglobin: 10.8 — ABNORMAL LOW
Hemoglobin: 11 — ABNORMAL LOW
Hemoglobin: 11.2 — ABNORMAL LOW
Hemoglobin: 11.5 — ABNORMAL LOW
Hemoglobin: 11.5 — ABNORMAL LOW
Hemoglobin: 11.6 — ABNORMAL LOW
Hemoglobin: 11.7 — ABNORMAL LOW
Hemoglobin: 12.2
Hemoglobin: 12.5
Hemoglobin: 12.5
Hemoglobin: 8.1 — ABNORMAL LOW
Hemoglobin: 8.6 — ABNORMAL LOW
Hemoglobin: 8.9 — ABNORMAL LOW
Hemoglobin: 9.8 — ABNORMAL LOW
Hemoglobin: 10.7 — ABNORMAL LOW
Hemoglobin: 8.2 — ABNORMAL LOW
Hemoglobin: 8.7 — ABNORMAL LOW
Hemoglobin: 8.9 — ABNORMAL LOW
Hemoglobin: 9.3 — ABNORMAL LOW
Hemoglobin: 9.4 — ABNORMAL LOW
Hemoglobin: 9.7 — ABNORMAL LOW
Hemoglobin: 9.8 — ABNORMAL LOW
MCHC: 33.2
MCHC: 33.4
MCHC: 33.4
MCHC: 33.5
MCHC: 33.6
MCHC: 33.7
MCHC: 33.7
MCHC: 33.8
MCHC: 33.9
MCHC: 33.9
MCHC: 34.2
MCHC: 34.3
MCHC: 34.4
MCHC: 34.5
MCHC: 34.5
MCHC: 35
MCHC: 33
MCHC: 33.3
MCHC: 33.7
MCHC: 33.7
MCHC: 33.7
MCHC: 33.9
MCHC: 34.5
MCV: 91.5
MCV: 91.7
MCV: 91.8
MCV: 91.8
MCV: 92.2
MCV: 92.3
MCV: 92.3
MCV: 92.4
MCV: 92.5
MCV: 92.8
MCV: 92.8
MCV: 93
MCV: 93
MCV: 93.9
MCV: 93.9
MCV: 94.1
MCV: 90.8
MCV: 91.3
MCV: 91.5
MCV: 91.6
MCV: 91.9
MCV: 92.6
MCV: 93.1
MCV: 93.4
MCV: 93.8
Platelets: 102 — ABNORMAL LOW
Platelets: 111 — ABNORMAL LOW
Platelets: 111 — ABNORMAL LOW
Platelets: 123 — ABNORMAL LOW
Platelets: 141 — ABNORMAL LOW
Platelets: 150
Platelets: 150
Platelets: 219
Platelets: 224
Platelets: 320
Platelets: 337
Platelets: 353
Platelets: 376
Platelets: 381
Platelets: 383
Platelets: 264
Platelets: 274
Platelets: 304
Platelets: 307
Platelets: 319
Platelets: 340
Platelets: 346
Platelets: 351
Platelets: 359
RBC: 2.52 — ABNORMAL LOW
RBC: 2.67 — ABNORMAL LOW
RBC: 2.85 — ABNORMAL LOW
RBC: 3.19 — ABNORMAL LOW
RBC: 3.38 — ABNORMAL LOW
RBC: 3.44 — ABNORMAL LOW
RBC: 3.44 — ABNORMAL LOW
RBC: 3.61 — ABNORMAL LOW
RBC: 3.62 — ABNORMAL LOW
RBC: 3.73 — ABNORMAL LOW
RBC: 3.76 — ABNORMAL LOW
RBC: 3.88
RBC: 3.97
RBC: 4.04
RBC: 2.66 — ABNORMAL LOW
RBC: 2.8 — ABNORMAL LOW
RBC: 2.89 — ABNORMAL LOW
RBC: 2.97 — ABNORMAL LOW
RBC: 3 — ABNORMAL LOW
RBC: 3.09 — ABNORMAL LOW
RBC: 3.21 — ABNORMAL LOW
RBC: 3.27 — ABNORMAL LOW
RBC: 3.4 — ABNORMAL LOW
RDW: 13.9
RDW: 14.1
RDW: 14.1
RDW: 14.3
RDW: 14.3
RDW: 14.3
RDW: 14.3
RDW: 14.3
RDW: 14.3
RDW: 14.4
RDW: 14.5
RDW: 14.6
RDW: 14.6
RDW: 14.7
RDW: 14.7
RDW: 15.4
RDW: 15.6 — ABNORMAL HIGH
RDW: 15.6 — ABNORMAL HIGH
RDW: 15.7 — ABNORMAL HIGH
RDW: 15.7 — ABNORMAL HIGH
RDW: 15.8 — ABNORMAL HIGH
RDW: 15.9 — ABNORMAL HIGH
RDW: 16.1 — ABNORMAL HIGH
WBC: 11.1 — ABNORMAL HIGH
WBC: 11.4 — ABNORMAL HIGH
WBC: 11.6 — ABNORMAL HIGH
WBC: 12.5 — ABNORMAL HIGH
WBC: 12.6 — ABNORMAL HIGH
WBC: 13.1 — ABNORMAL HIGH
WBC: 14.4 — ABNORMAL HIGH
WBC: 16.1 — ABNORMAL HIGH
WBC: 35.2 — ABNORMAL HIGH
WBC: 36.4 — ABNORMAL HIGH
WBC: 6.5
WBC: 8.4
WBC: 9.8
WBC: 11.3 — ABNORMAL HIGH
WBC: 11.8 — ABNORMAL HIGH
WBC: 12.8 — ABNORMAL HIGH
WBC: 13.2 — ABNORMAL HIGH
WBC: 13.9 — ABNORMAL HIGH
WBC: 14.3 — ABNORMAL HIGH
WBC: 14.7 — ABNORMAL HIGH
WBC: 14.9 — ABNORMAL HIGH
WBC: 21.5 — ABNORMAL HIGH

## 2011-01-11 LAB — CHOLESTEROL, TOTAL
Cholesterol: 75
Cholesterol: 68
Cholesterol: 87

## 2011-01-11 LAB — COMPREHENSIVE METABOLIC PANEL
ALT: 25
ALT: 84 — ABNORMAL HIGH
ALT: 20
ALT: 21
ALT: 43 — ABNORMAL HIGH
ALT: 50 — ABNORMAL HIGH
AST: 118 — ABNORMAL HIGH
AST: 35
AST: 24
AST: 26
AST: 45 — ABNORMAL HIGH
AST: 48 — ABNORMAL HIGH
Albumin: 2.1 — ABNORMAL LOW
Albumin: 2.6 — ABNORMAL LOW
Albumin: 2 — ABNORMAL LOW
Albumin: 2.1 — ABNORMAL LOW
Albumin: 2.1 — ABNORMAL LOW
Albumin: 2.1 — ABNORMAL LOW
Alkaline Phosphatase: 54
Alkaline Phosphatase: 56
Alkaline Phosphatase: 62
Alkaline Phosphatase: 67
Alkaline Phosphatase: 68
Alkaline Phosphatase: 79
BUN: 12
BUN: 20
BUN: 22
BUN: 10
BUN: 11
BUN: 11
BUN: 12
CO2: 25
CO2: 25
CO2: 25
CO2: 23
CO2: 24
CO2: 26
CO2: 27
Calcium: 7.7 — ABNORMAL LOW
Calcium: 8.2 — ABNORMAL LOW
Calcium: 8.4
Calcium: 8 — ABNORMAL LOW
Calcium: 8.1 — ABNORMAL LOW
Calcium: 8.3 — ABNORMAL LOW
Calcium: 8.4
Chloride: 100
Chloride: 104
Chloride: 101
Chloride: 104
Chloride: 94 — ABNORMAL LOW
Chloride: 99
Creatinine, Ser: 0.72
Creatinine, Ser: 0.77
Creatinine, Ser: 0.79
Creatinine, Ser: 0.54
Creatinine, Ser: 0.54
Creatinine, Ser: 0.67
Creatinine, Ser: 0.69
GFR calc Af Amer: >60
GFR calc Af Amer: >60
GFR calc Af Amer: >60
GFR calc Af Amer: >60
GFR calc Af Amer: >60
GFR calc Af Amer: >60
GFR calc Af Amer: >60
GFR calc non Af Amer: >60
GFR calc non Af Amer: >60
GFR calc non Af Amer: >60
GFR calc non Af Amer: >60
GFR calc non Af Amer: >60
GFR calc non Af Amer: >60
GFR calc non Af Amer: >60
Glucose, Bld: 152 — ABNORMAL HIGH
Glucose, Bld: 164 — ABNORMAL HIGH
Glucose, Bld: 173 — ABNORMAL HIGH
Glucose, Bld: 106 — ABNORMAL HIGH
Glucose, Bld: 107 — ABNORMAL HIGH
Glucose, Bld: 113 — ABNORMAL HIGH
Glucose, Bld: 150 — ABNORMAL HIGH
Potassium: 3.1 — ABNORMAL LOW
Potassium: 3.9
Potassium: 3.7
Potassium: 4
Potassium: 4
Potassium: 4.2
Sodium: 131 — ABNORMAL LOW
Sodium: 138
Sodium: 128 — ABNORMAL LOW
Sodium: 132 — ABNORMAL LOW
Sodium: 134 — ABNORMAL LOW
Sodium: 135
Total Bilirubin: 0.4
Total Bilirubin: 0.7
Total Bilirubin: 0.6
Total Bilirubin: 0.6
Total Bilirubin: 0.8
Total Bilirubin: 1.2
Total Protein: 5.4 — ABNORMAL LOW
Total Protein: 6
Total Protein: 5.1 — ABNORMAL LOW
Total Protein: 5.6 — ABNORMAL LOW
Total Protein: 6
Total Protein: 6.3

## 2011-01-11 LAB — GLUCOSE, CAPILLARY
Glucose-Capillary: 104 — ABNORMAL HIGH
Glucose-Capillary: 108 — ABNORMAL HIGH
Glucose-Capillary: 112 — ABNORMAL HIGH
Glucose-Capillary: 114 — ABNORMAL HIGH
Glucose-Capillary: 117 — ABNORMAL HIGH
Glucose-Capillary: 119 — ABNORMAL HIGH
Glucose-Capillary: 119 — ABNORMAL HIGH
Glucose-Capillary: 120 — ABNORMAL HIGH
Glucose-Capillary: 121 — ABNORMAL HIGH
Glucose-Capillary: 123 — ABNORMAL HIGH
Glucose-Capillary: 126 — ABNORMAL HIGH
Glucose-Capillary: 129 — ABNORMAL HIGH
Glucose-Capillary: 132 — ABNORMAL HIGH
Glucose-Capillary: 136 — ABNORMAL HIGH
Glucose-Capillary: 139 — ABNORMAL HIGH
Glucose-Capillary: 139 — ABNORMAL HIGH
Glucose-Capillary: 139 — ABNORMAL HIGH
Glucose-Capillary: 141 — ABNORMAL HIGH
Glucose-Capillary: 146 — ABNORMAL HIGH
Glucose-Capillary: 147 — ABNORMAL HIGH
Glucose-Capillary: 154 — ABNORMAL HIGH
Glucose-Capillary: 155 — ABNORMAL HIGH
Glucose-Capillary: 155 — ABNORMAL HIGH
Glucose-Capillary: 157 — ABNORMAL HIGH
Glucose-Capillary: 159 — ABNORMAL HIGH
Glucose-Capillary: 159 — ABNORMAL HIGH
Glucose-Capillary: 162 — ABNORMAL HIGH
Glucose-Capillary: 163 — ABNORMAL HIGH
Glucose-Capillary: 164 — ABNORMAL HIGH
Glucose-Capillary: 165 — ABNORMAL HIGH
Glucose-Capillary: 166 — ABNORMAL HIGH
Glucose-Capillary: 166 — ABNORMAL HIGH
Glucose-Capillary: 166 — ABNORMAL HIGH
Glucose-Capillary: 173 — ABNORMAL HIGH
Glucose-Capillary: 173 — ABNORMAL HIGH
Glucose-Capillary: 177 — ABNORMAL HIGH
Glucose-Capillary: 181 — ABNORMAL HIGH
Glucose-Capillary: 181 — ABNORMAL HIGH
Glucose-Capillary: 183 — ABNORMAL HIGH
Glucose-Capillary: 185 — ABNORMAL HIGH
Glucose-Capillary: 186 — ABNORMAL HIGH
Glucose-Capillary: 198 — ABNORMAL HIGH
Glucose-Capillary: 83
Glucose-Capillary: 96
Glucose-Capillary: 99
Glucose-Capillary: 103 — ABNORMAL HIGH
Glucose-Capillary: 103 — ABNORMAL HIGH
Glucose-Capillary: 105 — ABNORMAL HIGH
Glucose-Capillary: 113 — ABNORMAL HIGH
Glucose-Capillary: 114 — ABNORMAL HIGH
Glucose-Capillary: 114 — ABNORMAL HIGH
Glucose-Capillary: 114 — ABNORMAL HIGH
Glucose-Capillary: 121 — ABNORMAL HIGH
Glucose-Capillary: 121 — ABNORMAL HIGH
Glucose-Capillary: 125 — ABNORMAL HIGH
Glucose-Capillary: 129 — ABNORMAL HIGH
Glucose-Capillary: 131 — ABNORMAL HIGH
Glucose-Capillary: 131 — ABNORMAL HIGH
Glucose-Capillary: 132 — ABNORMAL HIGH
Glucose-Capillary: 135 — ABNORMAL HIGH
Glucose-Capillary: 135 — ABNORMAL HIGH
Glucose-Capillary: 137 — ABNORMAL HIGH
Glucose-Capillary: 137 — ABNORMAL HIGH
Glucose-Capillary: 139 — ABNORMAL HIGH
Glucose-Capillary: 139 — ABNORMAL HIGH
Glucose-Capillary: 141 — ABNORMAL HIGH
Glucose-Capillary: 142 — ABNORMAL HIGH
Glucose-Capillary: 143 — ABNORMAL HIGH
Glucose-Capillary: 145 — ABNORMAL HIGH
Glucose-Capillary: 145 — ABNORMAL HIGH
Glucose-Capillary: 153 — ABNORMAL HIGH
Glucose-Capillary: 153 — ABNORMAL HIGH
Glucose-Capillary: 157 — ABNORMAL HIGH
Glucose-Capillary: 161 — ABNORMAL HIGH
Glucose-Capillary: 166 — ABNORMAL HIGH
Glucose-Capillary: 172 — ABNORMAL HIGH
Glucose-Capillary: 202 — ABNORMAL HIGH
Glucose-Capillary: 77
Glucose-Capillary: 88
Glucose-Capillary: 96

## 2011-01-11 LAB — TYPE AND SCREEN
ABO/RH(D): A POS
Antibody Screen: NEGATIVE

## 2011-01-11 LAB — POCT I-STAT, CHEM 8
BUN: 6
Calcium, Ion: 1.05 — ABNORMAL LOW
Calcium, Ion: 1.25
Creatinine, Ser: 0.8
Glucose, Bld: 128 — ABNORMAL HIGH
HCT: 30 — ABNORMAL LOW
Hemoglobin: 10.2 — ABNORMAL LOW
Hemoglobin: 11.9 — ABNORMAL LOW
Potassium: 4.3
Sodium: 141
TCO2: 20
TCO2: 26

## 2011-01-11 LAB — DIFFERENTIAL
Basophils Absolute: 0
Basophils Absolute: 0
Basophils Absolute: 0.1
Basophils Relative: 0
Basophils Relative: 0
Basophils Relative: 1
Eosinophils Absolute: 0
Eosinophils Absolute: 0.3
Eosinophils Absolute: 0.8 — ABNORMAL HIGH
Eosinophils Relative: 0
Eosinophils Relative: 2
Eosinophils Relative: 5
Lymphocytes Relative: 7 — ABNORMAL LOW
Lymphocytes Relative: 13
Lymphocytes Relative: 22
Lymphs Abs: 2.5
Lymphs Abs: 2.7
Lymphs Abs: 3.2
Monocytes Absolute: 1.8 — ABNORMAL HIGH
Monocytes Absolute: 1.2 — ABNORMAL HIGH
Monocytes Absolute: 2.1 — ABNORMAL HIGH
Monocytes Relative: 5
Monocytes Relative: 10
Monocytes Relative: 8
Neutro Abs: 32.1 — ABNORMAL HIGH
Neutro Abs: 16.3 — ABNORMAL HIGH
Neutro Abs: 9.6 — ABNORMAL HIGH
Neutrophils Relative %: 88 — ABNORMAL HIGH
Neutrophils Relative %: 65
Neutrophils Relative %: 76

## 2011-01-11 LAB — PROTIME-INR
INR: 1.1
INR: 1.2
INR: 1.2
INR: 1.2
INR: 1.2
INR: 1.3
INR: 1.3
INR: 1.3
INR: 1.5
INR: 1.8 — ABNORMAL HIGH
INR: 1.8 — ABNORMAL HIGH
INR: 1.2
INR: 1.2
INR: 1.2
INR: 1.3
INR: 1.3
INR: 1.3
INR: 1.6 — ABNORMAL HIGH
INR: 1.7 — ABNORMAL HIGH
INR: 2.2 — ABNORMAL HIGH
INR: 2.6 — ABNORMAL HIGH
Prothrombin Time: 14.9
Prothrombin Time: 15.3 — ABNORMAL HIGH
Prothrombin Time: 15.5 — ABNORMAL HIGH
Prothrombin Time: 15.9 — ABNORMAL HIGH
Prothrombin Time: 15.9 — ABNORMAL HIGH
Prothrombin Time: 16.7 — ABNORMAL HIGH
Prothrombin Time: 16.8 — ABNORMAL HIGH
Prothrombin Time: 16.9 — ABNORMAL HIGH
Prothrombin Time: 17.5 — ABNORMAL HIGH
Prothrombin Time: 18.3 — ABNORMAL HIGH
Prothrombin Time: 21.5 — ABNORMAL HIGH
Prothrombin Time: 21.9 — ABNORMAL HIGH
Prothrombin Time: 15.3 — ABNORMAL HIGH
Prothrombin Time: 16.1 — ABNORMAL HIGH
Prothrombin Time: 16.1 — ABNORMAL HIGH
Prothrombin Time: 17 — ABNORMAL HIGH
Prothrombin Time: 20.2 — ABNORMAL HIGH
Prothrombin Time: 21 — ABNORMAL HIGH
Prothrombin Time: 26 — ABNORMAL HIGH
Prothrombin Time: 30 — ABNORMAL HIGH

## 2011-01-11 LAB — BASIC METABOLIC PANEL
BUN: 12
BUN: 13
BUN: 13
BUN: 13
BUN: 16
BUN: 25 — ABNORMAL HIGH
BUN: 26 — ABNORMAL HIGH
BUN: 8
BUN: 8
BUN: 9
BUN: 11
BUN: 12
BUN: 7
BUN: 7
CO2: 21
CO2: 21
CO2: 23
CO2: 24
CO2: 26
CO2: 26
CO2: 27
CO2: 27
CO2: 28
CO2: 29
CO2: 24
CO2: 25
CO2: 25
CO2: 26
Calcium: 8.1 — ABNORMAL LOW
Calcium: 8.1 — ABNORMAL LOW
Calcium: 8.3 — ABNORMAL LOW
Calcium: 8.4
Calcium: 8.5
Calcium: 8.6
Calcium: 8.8
Calcium: 8.8
Calcium: 8.9
Calcium: 7.7 — ABNORMAL LOW
Calcium: 7.8 — ABNORMAL LOW
Calcium: 8.4
Calcium: 8.7
Chloride: 100
Chloride: 101
Chloride: 101
Chloride: 102
Chloride: 103
Chloride: 105
Chloride: 108
Chloride: 98
Chloride: 99
Chloride: 99
Chloride: 101
Chloride: 102
Chloride: 103
Chloride: 105
Chloride: 105
Chloride: 105
Creatinine, Ser: 0.63
Creatinine, Ser: 0.66
Creatinine, Ser: 0.7
Creatinine, Ser: 0.73
Creatinine, Ser: 0.74
Creatinine, Ser: 0.74
Creatinine, Ser: 0.79
Creatinine, Ser: 0.79
Creatinine, Ser: 0.84
Creatinine, Ser: 1.12
Creatinine, Ser: 0.59
Creatinine, Ser: 0.6
Creatinine, Ser: 0.73
GFR calc Af Amer: 57 — ABNORMAL LOW
GFR calc Af Amer: >60
GFR calc Af Amer: >60
GFR calc Af Amer: >60
GFR calc Af Amer: >60
GFR calc Af Amer: >60
GFR calc Af Amer: >60
GFR calc Af Amer: >60
GFR calc Af Amer: >60
GFR calc Af Amer: >60
GFR calc Af Amer: >60
GFR calc Af Amer: >60
GFR calc Af Amer: >60
GFR calc non Af Amer: 47 — ABNORMAL LOW
GFR calc non Af Amer: >60
GFR calc non Af Amer: >60
GFR calc non Af Amer: >60
GFR calc non Af Amer: >60
GFR calc non Af Amer: >60
GFR calc non Af Amer: >60
GFR calc non Af Amer: >60
GFR calc non Af Amer: >60
GFR calc non Af Amer: >60
GFR calc non Af Amer: >60
GFR calc non Af Amer: >60
GFR calc non Af Amer: >60
Glucose, Bld: 106 — ABNORMAL HIGH
Glucose, Bld: 109 — ABNORMAL HIGH
Glucose, Bld: 119 — ABNORMAL HIGH
Glucose, Bld: 125 — ABNORMAL HIGH
Glucose, Bld: 133 — ABNORMAL HIGH
Glucose, Bld: 148 — ABNORMAL HIGH
Glucose, Bld: 156 — ABNORMAL HIGH
Glucose, Bld: 157 — ABNORMAL HIGH
Glucose, Bld: 163 — ABNORMAL HIGH
Glucose, Bld: 175 — ABNORMAL HIGH
Glucose, Bld: 102 — ABNORMAL HIGH
Glucose, Bld: 119 — ABNORMAL HIGH
Glucose, Bld: 124 — ABNORMAL HIGH
Glucose, Bld: 158 — ABNORMAL HIGH
Potassium: 3.3 — ABNORMAL LOW
Potassium: 3.6
Potassium: 3.8
Potassium: 3.9
Potassium: 4
Potassium: 4
Potassium: 4
Potassium: 4.3
Potassium: 4.6
Potassium: 4.6
Potassium: 3.1 — ABNORMAL LOW
Potassium: 3.6
Potassium: 4
Potassium: 4.2
Potassium: 4.4
Sodium: 130 — ABNORMAL LOW
Sodium: 134 — ABNORMAL LOW
Sodium: 134 — ABNORMAL LOW
Sodium: 135
Sodium: 136
Sodium: 136
Sodium: 137
Sodium: 138
Sodium: 141
Sodium: 133 — ABNORMAL LOW
Sodium: 134 — ABNORMAL LOW
Sodium: 134 — ABNORMAL LOW
Sodium: 135
Sodium: 137

## 2011-01-11 LAB — POCT I-STAT 3, ART BLOOD GAS (G3+)
Acid-base deficit: 2
Acid-base deficit: 2
Acid-base deficit: 3 — ABNORMAL HIGH
Bicarbonate: 23.3
Bicarbonate: 24
Bicarbonate: 25.2 — ABNORMAL HIGH
O2 Saturation: 99
O2 Saturation: 99
Patient temperature: 36.9
Patient temperature: 37.2
TCO2: 23
TCO2: 25
TCO2: 25
pCO2 arterial: 37.9
pCO2 arterial: 40.9
pH, Arterial: 7.338 — ABNORMAL LOW
pH, Arterial: 7.342 — ABNORMAL LOW
pH, Arterial: 7.354
pH, Arterial: 7.364

## 2011-01-11 LAB — URINALYSIS, MICROSCOPIC ONLY
Bilirubin Urine: NEGATIVE
Glucose, UA: NEGATIVE
Ketones, ur: 15 — AB
Leukocytes, UA: NEGATIVE
Nitrite: NEGATIVE
Protein, ur: >300 — AB
Specific Gravity, Urine: 1.042 — ABNORMAL HIGH
Urobilinogen, UA: 1
pH: 8.5 — ABNORMAL HIGH

## 2011-01-11 LAB — LACTIC ACID, PLASMA: Lactic Acid, Venous: 1.4

## 2011-01-11 LAB — POCT I-STAT 4, (NA,K, GLUC, HGB,HCT)
Glucose, Bld: 122 — ABNORMAL HIGH
Glucose, Bld: 137 — ABNORMAL HIGH
Glucose, Bld: 138 — ABNORMAL HIGH
Glucose, Bld: 167 — ABNORMAL HIGH
Glucose, Bld: 177 — ABNORMAL HIGH
HCT: 20 — ABNORMAL LOW
HCT: 26 — ABNORMAL LOW
HCT: 30 — ABNORMAL LOW
HCT: 34 — ABNORMAL LOW
Hemoglobin: 10.2 — ABNORMAL LOW
Hemoglobin: 6.8 — CL
Hemoglobin: 8.8 — ABNORMAL LOW
Hemoglobin: 8.8 — ABNORMAL LOW
Potassium: 3.9
Potassium: 4.1
Potassium: 4.6
Sodium: 136
Sodium: 136
Sodium: 139

## 2011-01-11 LAB — HEPARIN LEVEL (UNFRACTIONATED)
Heparin Unfractionated: 0.11 — ABNORMAL LOW
Heparin Unfractionated: 0.14 — ABNORMAL LOW
Heparin Unfractionated: 0.21 — ABNORMAL LOW
Heparin Unfractionated: 0.25 — ABNORMAL LOW
Heparin Unfractionated: 0.76 — ABNORMAL HIGH
Heparin Unfractionated: <0.1 — ABNORMAL LOW
Heparin Unfractionated: 0.23 — ABNORMAL LOW
Heparin Unfractionated: 0.25 — ABNORMAL LOW
Heparin Unfractionated: 0.35
Heparin Unfractionated: 0.37
Heparin Unfractionated: 0.46
Heparin Unfractionated: 0.47
Heparin Unfractionated: 0.59
Heparin Unfractionated: 0.66
Heparin Unfractionated: 0.72 — ABNORMAL HIGH

## 2011-01-11 LAB — BLOOD GAS, ARTERIAL
Acid-Base Excess: 0.2
Acid-base deficit: 3.4 — ABNORMAL HIGH
Bicarbonate: 19.9 — ABNORMAL LOW
Bicarbonate: 24.1 — ABNORMAL HIGH
Drawn by: 24485
FIO2: 0.21
O2 Content: 2
O2 Saturation: 91.7
O2 Saturation: 94.7
Patient temperature: 98.1
Patient temperature: 98.6
TCO2: 20.8
TCO2: 25.3
pCO2 arterial: 28.8 — ABNORMAL LOW
pCO2 arterial: 38.1
pH, Arterial: 7.418 — ABNORMAL HIGH
pH, Arterial: 7.453 — ABNORMAL HIGH
pO2, Arterial: 61.4 — ABNORMAL LOW
pO2, Arterial: 69.3 — ABNORMAL LOW

## 2011-01-11 LAB — PHOSPHORUS
Phosphorus: 1.8 — ABNORMAL LOW
Phosphorus: 2.2 — ABNORMAL LOW
Phosphorus: 2.6
Phosphorus: 4.3
Phosphorus: 2.3
Phosphorus: 3
Phosphorus: 3.3
Phosphorus: 4.1
Phosphorus: 4.2

## 2011-01-11 LAB — URINE CULTURE
Colony Count: 80000
Colony Count: NO GROWTH
Colony Count: >=100000
Culture: NO GROWTH
Special Requests: NEGATIVE

## 2011-01-11 LAB — PREALBUMIN
Prealbumin: 7.2 — ABNORMAL LOW
Prealbumin: 12 — ABNORMAL LOW
Prealbumin: 8.6 — ABNORMAL LOW

## 2011-01-11 LAB — CROSSMATCH
ABO/RH(D): A POS
ABO/RH(D): A POS
Antibody Screen: NEGATIVE
Antibody Screen: NEGATIVE

## 2011-01-11 LAB — VANCOMYCIN, TROUGH
Vancomycin Tr: 7.1 — ABNORMAL LOW
Vancomycin Tr: <5 — ABNORMAL LOW

## 2011-01-11 LAB — MAGNESIUM
Magnesium: 1.9
Magnesium: 1.9
Magnesium: 2.2
Magnesium: 2.4
Magnesium: 2.4
Magnesium: 2.8 — ABNORMAL HIGH
Magnesium: 2
Magnesium: 2
Magnesium: 2
Magnesium: 2

## 2011-01-11 LAB — PREPARE PLATELET PHERESIS

## 2011-01-11 LAB — CULTURE, BLOOD (ROUTINE X 2)
Culture: NO GROWTH
Culture: NO GROWTH

## 2011-01-11 LAB — PREPARE FRESH FROZEN PLASMA

## 2011-01-11 LAB — TRIGLYCERIDES
Triglycerides: 95
Triglycerides: 51
Triglycerides: 74

## 2011-01-11 LAB — LIPASE, BLOOD: Lipase: 26

## 2011-01-11 LAB — PREPARE RBC (CROSSMATCH)

## 2011-01-11 LAB — APTT
aPTT: 29
aPTT: 36

## 2011-01-11 LAB — HEPATIC FUNCTION PANEL
Albumin: 2.4 — ABNORMAL LOW
Alkaline Phosphatase: 55
Total Bilirubin: 0.9

## 2011-01-11 LAB — CULTURE, RESPIRATORY W GRAM STAIN

## 2011-01-11 LAB — CREATININE, SERUM
Creatinine, Ser: 0.6
Creatinine, Ser: 0.77
GFR calc Af Amer: >60
GFR calc Af Amer: >60
GFR calc non Af Amer: >60
GFR calc non Af Amer: >60

## 2011-01-11 LAB — ABO/RH: ABO/RH(D): A POS

## 2011-01-11 LAB — OCCULT BLOOD X 1 CARD TO LAB, STOOL: Fecal Occult Bld: POSITIVE

## 2011-01-11 LAB — URINE MICROSCOPIC-ADD ON

## 2011-01-11 LAB — AMYLASE: Amylase: 85

## 2011-01-12 ENCOUNTER — Encounter: Payer: Self-pay | Admitting: Vascular Surgery

## 2011-01-13 ENCOUNTER — Other Ambulatory Visit (INDEPENDENT_AMBULATORY_CARE_PROVIDER_SITE_OTHER): Payer: Medicare PPO | Admitting: *Deleted

## 2011-01-13 ENCOUNTER — Ambulatory Visit (INDEPENDENT_AMBULATORY_CARE_PROVIDER_SITE_OTHER): Payer: Medicare PPO | Admitting: Vascular Surgery

## 2011-01-13 ENCOUNTER — Encounter: Payer: Self-pay | Admitting: Vascular Surgery

## 2011-01-13 VITALS — BP 160/73 | HR 108 | Resp 24 | Ht 62.0 in | Wt 179.9 lb

## 2011-01-13 DIAGNOSIS — I70219 Atherosclerosis of native arteries of extremities with intermittent claudication, unspecified extremity: Secondary | ICD-10-CM

## 2011-01-13 DIAGNOSIS — I739 Peripheral vascular disease, unspecified: Secondary | ICD-10-CM

## 2011-01-13 DIAGNOSIS — M79609 Pain in unspecified limb: Secondary | ICD-10-CM

## 2011-01-13 NOTE — Progress Notes (Signed)
[facility] HISTORY AND PHYSICAL   History of Present Illness:  Patient is a 77 y.o. year old female who presents for evaluation of claudication.  The patient has previously undergone a right lower extremity embolectomy. She is also previously had a celiac artery stent placed. 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 She states that she develops diffuse pain from the hip ball of the foot in both legs after walking approximately one 10th of a mild. However she states she [facility] [place] [facility] without pain. He denies rest pain. She has no ulcerations on the feet. As far as her mesenteric ischemia is concerned she has had no abdominal pain is actually gained 40 pounds over the last 2 years.  Past Medical History  Diagnosis Date  . Diabetes mellitus age 79  . Hypertension   . Arthritis   . Joint pain   . Atrial fibrillation   . CHF (congestive heart failure)   . Mesenteric ischemia   . Acute pancreatitis   . GI bleed   . Peripheral vascular disease   . GERD (gastroesophageal reflux disease)   . Thyroid disease   . DJD (degenerative joint disease)   . CAD (coronary artery disease)   . DVT (deep venous thrombosis)   . Anemia     Past Surgical History  Procedure Date  . Embolectomy 2009    right leg  . Celiac artery stent   . Fasciotomy     right leg  . Coronary artery bypass graft   . Total knee arthroplasty     bilateral  . Appendectomy   . Tubal ligation     Review of systems: She denies shortness of breath or chest pain  Social History History  Substance Use Topics  . Smoking status: Never Smoker   . Smokeless tobacco: Not on file  . Alcohol Use: No    Family History Family History  Problem Relation Age of Onset  . Cancer Mother   . Other Father     suicide  . Coronary artery disease Sister   . Other Sister     alzheimers    Allergies  No Known Allergies   Current Outpatient  Prescriptions  Medication Sig Dispense Refill  . aspirin EC 81 MG tablet Take 81 mg by mouth daily.        . clonazePAM (KLONOPIN) 0.5 MG tablet Take 0.5 mg by mouth daily.        Marland Kitchen diltiazem (CARDIZEM CD) 360 MG 24 hr capsule Take 360 mg by mouth daily.        Tery Sanfilippo Sodium (COLACE PO) Take by mouth 2 (two) times daily.        Marland Kitchen levothyroxine (SYNTHROID, LEVOTHROID) 50 MCG tablet Take 50 mcg by mouth daily.        Marland Kitchen lisinopril (PRINIVIL,ZESTRIL) 20 MG tablet Take 20 mg by mouth daily.        . pantoprazole (PROTONIX) 40 MG tablet Take 40 mg by mouth daily.        . pravastatin (PRAVACHOL) 40 MG tablet Take 40 mg by mouth daily.        . TORSEMIDE PO Take 50 mg by mouth daily.        . traMADol (ULTRAM) 50 MG tablet Take 50 mg by mouth every 6 (six) hours as needed.        . WARFARIN SODIUM PO Take by mouth.  Physical Examination  Filed Vitals:   01/13/11 1027  BP: 160/73  Pulse: 108  Resp: 24  Height: 5\' 2"  (1.575 m)  Weight: 179 lb 14.4 oz (81.602 kg)    Body mass index is 32.90 kg/(m^2).  General:  Alert and oriented, no acute distress HEENT: Normal Neck: No bruit or JVD Pulmonary: Clear to auscultation bilaterally Cardiac: Regular Rate and Rhythm without murmur Gastrointestinal: Soft, non-tender, non-distended, no mass Skin: No rash Extremity Pulses:  2+ radial, brachial, femoral pulses bilaterally. She does not have palpable pedal pulses in the right foot. She has a 2+ left dorsalis pedis pulse. Musculoskeletal: No deformity or edema  Neurologic: Upper and lower extremity motor 5/5 and symmetric. She has decreased sensation on the plantar aspect of both feet bilaterally and has a known chronic neuropathy.  DATA:  ABIs were performed on 01/13/2011. These were reviewed and interpreted. ABI on the right side was 1.23 left was 1.03 waveforms are triphasic bilaterally  ASSESSMENT/PLAN: Bilateral lower extremity pain most likely secondary to degenerative joint  disease and deconditioning. The patient has gained significant weight or the last 2 years and probably has put increased stress on her joints. She was counseled today to try to lose 10 pounds over the next year. She has normal ABIs and I did not believe this pain are present claudication. She will followup in December 2012 a repeat mesenteric duplex scan to check her stent.

## 2011-03-01 ENCOUNTER — Encounter: Payer: Self-pay | Admitting: Thoracic Diseases

## 2011-03-02 ENCOUNTER — Ambulatory Visit: Payer: Medicare PPO

## 2011-06-29 ENCOUNTER — Encounter: Payer: Self-pay | Admitting: Vascular Surgery

## 2011-06-30 ENCOUNTER — Ambulatory Visit (INDEPENDENT_AMBULATORY_CARE_PROVIDER_SITE_OTHER): Payer: Medicare PPO | Admitting: Neurosurgery

## 2011-06-30 ENCOUNTER — Encounter: Payer: Self-pay | Admitting: Neurosurgery

## 2011-06-30 ENCOUNTER — Other Ambulatory Visit (INDEPENDENT_AMBULATORY_CARE_PROVIDER_SITE_OTHER): Payer: Medicare PPO | Admitting: *Deleted

## 2011-06-30 VITALS — BP 145/85 | HR 87 | Resp 24 | Ht 62.0 in | Wt 172.8 lb

## 2011-06-30 DIAGNOSIS — K551 Chronic vascular disorders of intestine: Secondary | ICD-10-CM

## 2011-06-30 DIAGNOSIS — K55059 Acute (reversible) ischemia of intestine, part and extent unspecified: Secondary | ICD-10-CM

## 2011-06-30 NOTE — Progress Notes (Signed)
Subjective:     Patient ID: [name], female   DOB: 21-Oct-1929, 78 y.o.   MRN: [id number]  WUJ:[id number] is a 78 y.o. year old female who presents for evaluation of claudication and eval of a known SMA occlusion. The patient has previously undergone a right lower extremity embolectomy. She is also previously had a celiac artery stent placed. 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   Review of Systems: ROS negative except forsome discomfort with eating but managed well with diet. 12 point review of systems otherwise unremarkable     Objective:   Physical Exam: Vital signs stable, well-developed well-nourished 78 year old female is patient of Dr. Darrick Penna which seen for followup of a known SMA occlusion. She continues to do well with her diet she is maintaining her weight she has no problems with abdominal pain. She is nontender over her abdomen to palpation, she also has 1+ palpable pulses bilaterally in the posterior tibial as well as the dorsalis pedis     Assessment:     Known occluded superior mesenteric artery without change and duplex today. Mesenteric duplex findings: Proximal aorta it 44, mid aorta 53, distal aorta 56, SMA occluded, SMA proximal 112, SMA mid 91, SMA distal occluded.     Plan:     The duplex was reviewed by Dr. Darrick Penna and myself with the patient. She will return to clinic in one year for repeat mesenteric arterial duplex evaluation, her questions were encouraged and answered  Lauree Chandler ANP X.  Clinic M.D.: Fields

## 2011-07-14 NOTE — Procedures (Unsigned)
MESENTERIC ARTERIAL DUPLEX EVALUATION  INDICATION:  Chronic mesenteric insufficiency  HISTORY: Diabetes:  Yes Cardiac:  CABG, CHF Hypertension:  Yes Smoking:  No Other:  Celiac artery stent on 09/02/2008, known proximal SFA occlusion  Mesenteric Duplex Findings: Aorta - Proximal                            44 Aorta - Mid                                 53 Aorta - Distal                              56  Celiac Trunk - Proximal                     261/74 Celiac Trunk - Distal                       195/44  Hepatic Artery                              132 Splenic Artery                              Not visualized  Superior Mesenteric Artery-Origin           Not visualized Superior Mesenteric Artery-Proximal         112 Superior Mesenteric Artery-Mid              91 Superior Mesenteric Artery-Distal           Not visualized  Inferior Mesenteric Artery-Proximal         Not visualized  IMPRESSION: 1. Doppler velocities of the proximal celiac artery suggests >75%     stenosis. 2. The splenic, inferior mesenteric and origin and distal superior     mesenteric arteries were not adequately visualized due to patient     body habitus and bowel gas patterns. 3. 3.3 x 3.3 cm aneurysmal dilatation of the distal abdominal aorta     noted. 4. No significant change in the visualized velocity measurements of     the abdominal vasculature when compared to the previous exam on     12/09/2010.     ___________________________________________ [name]. Fields, MD  CH/MEDQ  D:  07/05/2011  T:  07/05/2011  Job:  [id number]

## 2011-10-27 ENCOUNTER — Ambulatory Visit: Payer: Medicare PPO | Admitting: Adult Health

## 2012-01-05 ENCOUNTER — Telehealth: Payer: Self-pay | Admitting: *Deleted

## 2012-01-05 NOTE — Telephone Encounter (Signed)
Patient's daughter [name]) called to report that [name] would be having an EGD tomorrow at [facility]. She has been having melena and abdominal pain over the past week, Hgb has gone from 8.3 to 7.58. Dr. Teena Dunk will also be giving her 1 unit of blood. She has been taken off her Coumadin and ASA temporarily and has been given 2 Vitamin K injections. I reassured [name] that this plan of care was in her mother's best interest. She said that Dr. Teena Dunk was aware of the previous celiac stent placement by Dr. Darrick Penna.  I gave her my phone number to give Dr. Teena Dunk in case he needed any further information.  [name] next follow up appt is with Rusty in March 2014 but I told [name] this could be moved up as needed. She voiced understanding and was in agreement.

## 2012-06-28 ENCOUNTER — Ambulatory Visit: Payer: Medicare PPO | Admitting: Neurosurgery

## 2012-06-28 ENCOUNTER — Other Ambulatory Visit (INDEPENDENT_AMBULATORY_CARE_PROVIDER_SITE_OTHER): Payer: Medicare PPO | Admitting: *Deleted

## 2012-06-28 DIAGNOSIS — K551 Chronic vascular disorders of intestine: Secondary | ICD-10-CM

## 2012-06-28 DIAGNOSIS — K55059 Acute (reversible) ischemia of intestine, part and extent unspecified: Secondary | ICD-10-CM

## 2012-11-07 ENCOUNTER — Telehealth: Payer: Self-pay

## 2012-11-07 NOTE — Telephone Encounter (Signed)
Pt's daughter called to report pt. c/o increased nausea with occasional vomiting over past 2 months.  States c/o abdominal pain across lower abdomen;  reports symptoms are usually associated with eating.  Denies any abdominal distension, fever or chills.  States has normal BM's.   Requesting appt. tomorrow to see Dr. Darrick Penna.  Advised daughter that will schedule appt. at Dr. Darrick Penna next available appt. on  8/21, and will discuss with him when in office tomorrow, if needs to be scheduled sooner.  Verb. Understanding.

## 2012-11-29 ENCOUNTER — Other Ambulatory Visit: Payer: Medicare PPO

## 2012-11-29 ENCOUNTER — Ambulatory Visit: Payer: Medicare PPO | Admitting: Vascular Surgery

## 2012-12-06 ENCOUNTER — Ambulatory Visit: Payer: Medicare PPO | Admitting: Vascular Surgery

## 2013-01-14 ENCOUNTER — Telehealth: Payer: Self-pay

## 2013-01-14 DIAGNOSIS — K551 Chronic vascular disorders of intestine: Secondary | ICD-10-CM

## 2013-01-14 DIAGNOSIS — R109 Unspecified abdominal pain: Secondary | ICD-10-CM

## 2013-01-14 NOTE — Telephone Encounter (Signed)
Daughter called to report pt. C/o pain and some bloating in lower abdomen every time she eats.  Denies any nausea or vomiting.  Pt. Had appt. 11/29/12 and cancelled appt. due to improvement in symptoms at that time.  Informed daughter will reschedule appt. for evaluation of symptoms; to be notified of time/ date of appt.

## 2013-02-13 ENCOUNTER — Encounter: Payer: Self-pay | Admitting: Vascular Surgery

## 2013-02-14 ENCOUNTER — Ambulatory Visit (HOSPITAL_COMMUNITY)
Admission: RE | Admit: 2013-02-14 | Discharge: 2013-02-14 | Disposition: A | Discharge status: Home / Self Care | Payer: Medicare PPO | Source: Ambulatory Visit | Attending: Vascular Surgery | Admitting: Vascular Surgery

## 2013-02-14 ENCOUNTER — Encounter: Payer: Self-pay | Admitting: Vascular Surgery

## 2013-02-14 ENCOUNTER — Ambulatory Visit (INDEPENDENT_AMBULATORY_CARE_PROVIDER_SITE_OTHER): Payer: Medicare PPO | Admitting: Vascular Surgery

## 2013-02-14 VITALS — BP 170/74 | HR 77 | Ht 62.0 in | Wt 162.3 lb

## 2013-02-14 DIAGNOSIS — R109 Unspecified abdominal pain: Secondary | ICD-10-CM

## 2013-02-14 DIAGNOSIS — K551 Chronic vascular disorders of intestine: Secondary | ICD-10-CM

## 2013-02-14 DIAGNOSIS — Z01810 Encounter for preprocedural cardiovascular examination: Secondary | ICD-10-CM

## 2013-02-14 DIAGNOSIS — K55059 Acute (reversible) ischemia of intestine, part and extent unspecified: Secondary | ICD-10-CM

## 2013-02-14 NOTE — Progress Notes (Signed)
[facility]  HISTORY AND PHYSICAL    History of Present Illness:  Patient is a 79 y.o. year old female who presents for evaluation of abdominal pain.  She has previously had a celiac artery stent placed. 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 As far as her mesenteric ischemia is concerned she has had some abdominal pain recently. She states she has lost 10 pounds in the past year. Several weeks ago she was having pain after almost every meal. She states she has had problems with spicy foods for several years. But this recent episode was different. She has had no problems this week. She has no abdominal pain today.    Past Medical History   Diagnosis  Date   .  Diabetes mellitus  age 63   .  Hypertension     .  Arthritis     .  Joint pain     .  Atrial fibrillation     .  CHF (congestive heart failure)     .  Mesenteric ischemia     .  Acute pancreatitis     .  GI bleed     .  Peripheral vascular disease     .  GERD (gastroesophageal reflux disease)     .  Thyroid disease     .  DJD (degenerative joint disease)     .  CAD (coronary artery disease)     .  DVT (deep venous thrombosis)     .  Anemia         Past Surgical History   Procedure  Date   .  Embolectomy  2009       right leg   .  Celiac artery stent     .  Fasciotomy         right leg   .  Coronary artery bypass graft     .  Total knee arthroplasty         bilateral   .  Appendectomy     .  Tubal ligation       Review of systems: She denies shortness of breath or chest pain  Social History History   Substance Use Topics   .  Smoking status:  Never Smoker    .  Smokeless tobacco:  Not on file   .  Alcohol Use:  No     Family History Family History   Problem  Relation  Age of Onset   .  Cancer  Mother     .  Other  Father         suicide   .  Coronary artery disease  Sister     .  Other  Sister         alzheimers     Allergies  No Known  Allergies  Medications :  Current Outpatient Prescriptions on File Prior to Visit  Medication Sig Dispense Refill  . aspirin EC 81 MG tablet Take 81 mg by mouth daily.        . clonazePAM (KLONOPIN) 0.5 MG tablet Take 0.5 mg by mouth daily.        Marland Kitchen diltiazem (CARDIZEM CD) 360 MG 24 hr capsule Take 360 mg by mouth daily.        Tery Sanfilippo Sodium (COLACE PO) Take by mouth 2 (two) times daily.        Marland Kitchen levothyroxine (SYNTHROID, LEVOTHROID) 50 MCG tablet Take 50  mcg by mouth daily.        Marland Kitchen lisinopril (PRINIVIL,ZESTRIL) 20 MG tablet Take 20 mg by mouth daily.        . pantoprazole (PROTONIX) 40 MG tablet Take 40 mg by mouth daily.        . pravastatin (PRAVACHOL) 40 MG tablet Take 40 mg by mouth daily.        . TORSEMIDE PO Take 50 mg by mouth daily.        . traMADol (ULTRAM) 50 MG tablet Take 50 mg by mouth every 6 (six) hours as needed.        . WARFARIN SODIUM PO Take by mouth.         No current facility-administered medications on file prior to visit.      Physical Examination     Filed Vitals:   02/14/13 1028  BP: 170/74  Pulse: 77  Height: 5\' 2"  (1.575 m)  Weight: 162 lb 4.8 oz (73.619 kg)  SpO2: 95%    General:  Alert and oriented, no acute distress HEENT: Normal Neck: No bruit or JVD, left carotid bruit Pulmonary: Clear to auscultation bilaterally Cardiac: Regular Rate and Rhythm without murmur Gastrointestinal: Soft, non-tender, non-distended, no mass, no bruit Skin: No rash Extremity Pulses:  2+ radial, brachial, femoral pulses bilaterally.  Musculoskeletal: No deformity or edema     Neurologic: Upper and lower extremity motor 5/5 and symmetric.   DATA:  She had a mesenteric duplex exam performed today. This showed chronic occlusion of her superior mesenteric artery. She did have some ectasia of her aorta 3 cm diameter, the entire celiac stent was not visualized. The distal segment did appear patent with some intimal hyperplasia.  ASSESSMENT/PLAN: Recent  abdominal pain possible celiac artery recurrent stenosis but difficult to interpret on today's duplex exam. The patient currently has been asymptomatic for the last week. She will return in 6 weeks for an attempt at repeat duplex exam. If we are unable to visualize the stent fully she may need a repeat mesenteric arteriogram. Also of note she has a new left-sided carotid bruit and will need a carotid duplex exam to evaluate this further.  Fabienne Bruns, MD [facility] [place] Office: [phone] Pager: [phone]

## 2013-03-28 ENCOUNTER — Other Ambulatory Visit (HOSPITAL_COMMUNITY): Payer: Medicare PPO

## 2013-03-28 ENCOUNTER — Ambulatory Visit: Payer: Medicare PPO | Admitting: Vascular Surgery

## 2013-04-15 ENCOUNTER — Other Ambulatory Visit: Payer: Self-pay | Admitting: Vascular Surgery

## 2013-04-15 DIAGNOSIS — R0989 Other specified symptoms and signs involving the circulatory and respiratory systems: Secondary | ICD-10-CM

## 2013-05-01 ENCOUNTER — Encounter: Payer: Self-pay | Admitting: Vascular Surgery

## 2013-05-02 ENCOUNTER — Ambulatory Visit (HOSPITAL_COMMUNITY)
Admission: RE | Admit: 2013-05-02 | Discharge: 2013-05-02 | Disposition: A | Discharge status: Home / Self Care | Payer: Medicare PPO | Source: Ambulatory Visit | Attending: Vascular Surgery | Admitting: Vascular Surgery

## 2013-05-02 ENCOUNTER — Ambulatory Visit (INDEPENDENT_AMBULATORY_CARE_PROVIDER_SITE_OTHER): Payer: Medicare PPO | Admitting: Vascular Surgery

## 2013-05-02 ENCOUNTER — Ambulatory Visit (INDEPENDENT_AMBULATORY_CARE_PROVIDER_SITE_OTHER)
Admission: RE | Admit: 2013-05-02 | Discharge: 2013-05-02 | Disposition: A | Discharge status: Home / Self Care | Payer: Medicare PPO | Source: Ambulatory Visit | Attending: Vascular Surgery | Admitting: Vascular Surgery

## 2013-05-02 ENCOUNTER — Encounter: Payer: Self-pay | Admitting: Vascular Surgery

## 2013-05-02 VITALS — BP 147/61 | HR 93 | Ht 62.0 in | Wt 161.4 lb

## 2013-05-02 DIAGNOSIS — I6529 Occlusion and stenosis of unspecified carotid artery: Secondary | ICD-10-CM

## 2013-05-02 DIAGNOSIS — R0989 Other specified symptoms and signs involving the circulatory and respiratory systems: Secondary | ICD-10-CM | POA: Insufficient documentation

## 2013-05-02 DIAGNOSIS — T82898A Other specified complication of vascular prosthetic devices, implants and grafts, initial encounter: Secondary | ICD-10-CM | POA: Insufficient documentation

## 2013-05-02 DIAGNOSIS — K551 Chronic vascular disorders of intestine: Secondary | ICD-10-CM

## 2013-05-02 DIAGNOSIS — Y849 Medical procedure, unspecified as the cause of abnormal reaction of the patient, or of later complication, without mention of misadventure at the time of the procedure: Secondary | ICD-10-CM | POA: Insufficient documentation

## 2013-05-02 DIAGNOSIS — Z01810 Encounter for preprocedural cardiovascular examination: Secondary | ICD-10-CM

## 2013-05-02 DIAGNOSIS — K55059 Acute (reversible) ischemia of intestine, part and extent unspecified: Secondary | ICD-10-CM

## 2013-05-02 NOTE — Progress Notes (Signed)
[facility] [place] HISTORY AND PHYSICAL    History of Present Illness:  Patient is a 80 y.o. year old female who presents for evaluation of abdominal pain.  She has previously had a celiac artery stent placed. 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 As far as her mesenteric ischemia is concerned she has had some abdominal pain recently. She states she has lost 20 pounds in the past year 2 years. Several weeks ago she was having pain after almost every meal. She states she has had problems with spicy foods for several years. But this recent episode was different. She has had no problems this week. She has no abdominal pain today. She is currently living at [facility]. She is on Coumadin for atrial fibrillation. Other chronic medical problems include diabetes hypertension congestive heart failure degenerative joint disease. She is also here today after a carotid duplex exam for asymptomatic carotid bruit.    Past Medical History    Diagnosis   Date    .   Diabetes mellitus   age 25    .   Hypertension       .   Arthritis       .   Joint pain       .   Atrial fibrillation       .   CHF (congestive heart failure)       .   Mesenteric ischemia       .   Acute pancreatitis       .   GI bleed       .   Peripheral vascular disease       .   GERD (gastroesophageal reflux disease)       .   Thyroid disease       .   DJD (degenerative joint disease)       .   CAD (coronary artery disease)       .   DVT (deep venous thrombosis)       .   Anemia           Past Surgical History    Procedure   Date    .   Embolectomy   2009          right leg    .   Celiac artery stent       .   Fasciotomy             right leg    .   Coronary artery bypass graft       .   Total knee arthroplasty             bilateral    .   Appendectomy       .   Tubal ligation         Review of systems: She denies shortness of breath or chest  pain  Social History History    Substance Use Topics    .   Smoking status:   Never Smoker     .   Smokeless tobacco:   Not on file    .   Alcohol Use:   No      Family History Family History    Problem   Relation   Age of Onset    .   Cancer   Mother       .   Other   Father  suicide    .   Coronary artery disease   Sister       .   Other   Sister             alzheimers      Allergies  No Known Allergies  Medications :  Current Outpatient Prescriptions on File Prior to Visit   Medication  Sig  Dispense  Refill   .  aspirin EC 81 MG tablet  Take 81 mg by mouth daily.           .  clonazePAM (KLONOPIN) 0.5 MG tablet  Take 0.5 mg by mouth daily.           Marland Kitchen  diltiazem (CARDIZEM CD) 360 MG 24 hr capsule  Take 360 mg by mouth daily.           Tery Sanfilippo Sodium (COLACE PO)  Take by mouth 2 (two) times daily.           Marland Kitchen  levothyroxine (SYNTHROID, LEVOTHROID) 50 MCG tablet  Take 50 mcg by mouth daily.           Marland Kitchen  lisinopril (PRINIVIL,ZESTRIL) 20 MG tablet  Take 20 mg by mouth daily.           .  pantoprazole (PROTONIX) 40 MG tablet  Take 40 mg by mouth daily.           .  pravastatin (PRAVACHOL) 40 MG tablet  Take 40 mg by mouth daily.           .  TORSEMIDE PO  Take 50 mg by mouth daily.           .  traMADol (ULTRAM) 50 MG tablet  Take 50 mg by mouth every 6 (six) hours as needed.           .  WARFARIN SODIUM PO  Take by mouth.              No current facility-administered medications on file prior to visit.       Physical Examination       Filed Vitals:   05/02/13 1019 05/02/13 1021  BP: 129/76 147/61  Pulse: 93   Height: 5\' 2"  (1.575 m)   Weight: 161 lb 6.4 oz (73.211 kg)   SpO2: 97%     General:  Alert and oriented, no acute distress HEENT: Normal Neck: No bruit or JVD, left carotid bruit Pulmonary: Clear to auscultation bilaterally Cardiac: Regular Rate and Rhythm without murmur Gastrointestinal: Soft, non-tender, non-distended, no mass, no  bruit Skin: No rash Extremity Pulses:  2+ radial, brachial, femoral pulses bilaterally.  Musculoskeletal: No deformity or edema     Neurologic: Upper and lower extremity motor 5/5 and symmetric.   DATA:  She had a mesenteric duplex exam performed today. This showed chronic occlusion of her superior mesenteric artery. The celiac stent had elevated velocities greater than 300 cm/s today. Carotid duplex her 60-80% right internal carotid artery stenosis 60-80% left internal carotid artery stenosis antegrade vertebral flow bilaterally. I reviewed and interpreted this study.  ASSESSMENT/PLAN: Recurrent stenosis celiac stent. The patient will have a mesenteric angiogram on February 20. We will stop her Coumadin 3 days prior to this. Possible intervention at the time of her angiogram if there is a significant narrowing. She is not really a candidate for open operation. Additionally she has bilateral moderate carotid stenosis and will need repeat carotid duplex exam in 6 months.  This benefits possible complications  and procedure details of mesenteric angiogram as well as possible reangioplasty of her stent The patient and her daughter today. These include but are not limited to gut ischemia vessel injury bleeding and infection they understand and agree to proceed.  Fabienne Bruns, MD [facility] [place] Office: [phone] Pager: [phone]

## 2013-05-08 ENCOUNTER — Other Ambulatory Visit: Payer: Self-pay | Admitting: *Deleted

## 2013-05-17 ENCOUNTER — Encounter (HOSPITAL_COMMUNITY): Payer: Self-pay | Admitting: Pharmacy Technician

## 2013-05-31 ENCOUNTER — Encounter (HOSPITAL_COMMUNITY)
Admission: RE | Disposition: A | Discharge status: Home / Self Care | Payer: Self-pay | Source: Ambulatory Visit | Attending: Vascular Surgery

## 2013-05-31 ENCOUNTER — Other Ambulatory Visit: Payer: Self-pay

## 2013-05-31 ENCOUNTER — Ambulatory Visit (HOSPITAL_COMMUNITY)
Admission: RE | Admit: 2013-05-31 | Discharge: 2013-05-31 | Disposition: A | Discharge status: Home / Self Care | Payer: Medicare PPO | Source: Ambulatory Visit | Attending: Vascular Surgery | Admitting: Vascular Surgery

## 2013-05-31 DIAGNOSIS — I2510 Atherosclerotic heart disease of native coronary artery without angina pectoris: Secondary | ICD-10-CM | POA: Insufficient documentation

## 2013-05-31 DIAGNOSIS — K219 Gastro-esophageal reflux disease without esophagitis: Secondary | ICD-10-CM | POA: Insufficient documentation

## 2013-05-31 DIAGNOSIS — D649 Anemia, unspecified: Secondary | ICD-10-CM | POA: Insufficient documentation

## 2013-05-31 DIAGNOSIS — I4891 Unspecified atrial fibrillation: Secondary | ICD-10-CM | POA: Insufficient documentation

## 2013-05-31 DIAGNOSIS — E119 Type 2 diabetes mellitus without complications: Secondary | ICD-10-CM | POA: Insufficient documentation

## 2013-05-31 DIAGNOSIS — K551 Chronic vascular disorders of intestine: Secondary | ICD-10-CM | POA: Insufficient documentation

## 2013-05-31 DIAGNOSIS — M1990 Unspecified osteoarthritis, unspecified site: Secondary | ICD-10-CM | POA: Insufficient documentation

## 2013-05-31 DIAGNOSIS — Z86718 Personal history of other venous thrombosis and embolism: Secondary | ICD-10-CM | POA: Insufficient documentation

## 2013-05-31 DIAGNOSIS — Z7901 Long term (current) use of anticoagulants: Secondary | ICD-10-CM | POA: Insufficient documentation

## 2013-05-31 DIAGNOSIS — Z7982 Long term (current) use of aspirin: Secondary | ICD-10-CM | POA: Insufficient documentation

## 2013-05-31 DIAGNOSIS — I10 Essential (primary) hypertension: Secondary | ICD-10-CM | POA: Insufficient documentation

## 2013-05-31 DIAGNOSIS — I509 Heart failure, unspecified: Secondary | ICD-10-CM | POA: Insufficient documentation

## 2013-05-31 DIAGNOSIS — E079 Disorder of thyroid, unspecified: Secondary | ICD-10-CM | POA: Insufficient documentation

## 2013-05-31 DIAGNOSIS — I739 Peripheral vascular disease, unspecified: Secondary | ICD-10-CM | POA: Insufficient documentation

## 2013-05-31 DIAGNOSIS — I774 Celiac artery compression syndrome: Secondary | ICD-10-CM | POA: Insufficient documentation

## 2013-05-31 DIAGNOSIS — K55059 Acute (reversible) ischemia of intestine, part and extent unspecified: Secondary | ICD-10-CM

## 2013-05-31 HISTORY — PX: VISCERAL ANGIOGRAM: SHX5515

## 2013-05-31 LAB — GLUCOSE, CAPILLARY: Glucose-Capillary: 92 mg/dL (ref 70–99)

## 2013-05-31 LAB — POCT I-STAT, CHEM 8
BUN: 29 mg/dL — AB (ref 6–23)
CALCIUM ION: 0.91 mmol/L — AB (ref 1.13–1.30)
CHLORIDE: 103 meq/L (ref 96–112)
Creatinine, Ser: 1.4 mg/dL — ABNORMAL HIGH (ref 0.50–1.10)
Glucose, Bld: 134 mg/dL — ABNORMAL HIGH (ref 70–99)
HCT: 34 % — ABNORMAL LOW (ref 36.0–46.0)
Hemoglobin: 11.6 g/dL — ABNORMAL LOW (ref 12.0–15.0)
POTASSIUM: 3.8 meq/L (ref 3.7–5.3)
Sodium: 139 mEq/L (ref 137–147)
TCO2: 24 mmol/L (ref 0–100)

## 2013-05-31 LAB — PROTIME-INR
INR: 1.31 (ref 0.00–1.49)
Prothrombin Time: 16 seconds — ABNORMAL HIGH (ref 11.6–15.2)

## 2013-05-31 SURGERY — VISCERAL ANGIOGRAM
Anesthesia: LOCAL

## 2013-05-31 MED ORDER — ASPIRIN 325 MG PO TABS
325.0000 mg | ORAL_TABLET | Freq: Every day | ORAL | Status: DC
  Administered 2013-05-31: 325 mg via ORAL

## 2013-05-31 MED ORDER — ASPIRIN BUFFERED 325 MG PO TABS: 325.0000 mg | ORAL_TABLET | Freq: Every day | ORAL | Status: DC

## 2013-05-31 MED ORDER — ONDANSETRON HCL 4 MG/2ML IJ SOLN: 4.0000 mg | Freq: Four times a day (QID) | INTRAMUSCULAR | Status: DC | PRN

## 2013-05-31 MED ORDER — SODIUM CHLORIDE 0.45 % IV SOLN
INTRAVENOUS | Status: DC
  Administered 2013-05-31: 14:00:00 via INTRAVENOUS

## 2013-05-31 MED ORDER — HEPARIN (PORCINE) IN NACL 2-0.9 UNIT/ML-% IJ SOLN
INTRAMUSCULAR | Status: AC
  Filled 2013-05-31: qty 1000

## 2013-05-31 MED ORDER — ACETAMINOPHEN 325 MG RE SUPP
325.0000 mg | RECTAL | Status: DC | PRN
  Filled 2013-05-31: qty 2

## 2013-05-31 MED ORDER — HEPARIN SODIUM (PORCINE) 1000 UNIT/ML IJ SOLN
INTRAMUSCULAR | Status: AC
  Filled 2013-05-31: qty 1

## 2013-05-31 MED ORDER — MORPHINE SULFATE 10 MG/ML IJ SOLN: 2.0000 mg | INTRAMUSCULAR | Status: DC | PRN

## 2013-05-31 MED ORDER — ACETAMINOPHEN 325 MG PO TABS
325.0000 mg | ORAL_TABLET | ORAL | Status: DC | PRN
  Filled 2013-05-31: qty 2

## 2013-05-31 MED ORDER — LIDOCAINE HCL (PF) 1 % IJ SOLN
INTRAMUSCULAR | Status: AC
  Filled 2013-05-31: qty 30

## 2013-05-31 MED ORDER — SODIUM CHLORIDE 0.9 % IV SOLN
INTRAVENOUS | Status: DC
  Administered 2013-05-31: 09:00:00 via INTRAVENOUS

## 2013-05-31 MED ORDER — HYDRALAZINE HCL 20 MG/ML IJ SOLN: 10.0000 mg | INTRAMUSCULAR | Status: DC | PRN

## 2013-05-31 MED ORDER — LABETALOL HCL 5 MG/ML IV SOLN: 10.0000 mg | INTRAVENOUS | Status: DC | PRN

## 2013-05-31 MED ORDER — ASPIRIN 325 MG PO TABS
ORAL_TABLET | ORAL | Status: AC
  Filled 2013-05-31: qty 1

## 2013-05-31 MED ORDER — METOPROLOL TARTRATE 1 MG/ML IV SOLN: 2.0000 mg | INTRAVENOUS | Status: DC | PRN

## 2013-05-31 NOTE — Op Note (Signed)
Procedure: Mesenteric angiogram, celiac artery stent (6 x 15 balloon expandable)  Preoperative diagnosis: Mesenteric ischemia  Postoperative diagnosis: Same  Anesthesia: Local  Indications: Patient is an 80 year old female who previously underwent celiac artery stenting approximately 3 years ago. She has noted to have recurrent stenosis on duplex ultrasound. She has also had more intolerance of food recently and weight loss.  Operative findings: #1 recurrent stenosis proximal celiac artery 75%                                 #2 chronic occlusion superior mesenteric artery and inferior mesenteric artery                                 #3 6 x 15 balloon expandable stent telescoped into prior stent with proximal aspect extending slightly into the aorta  Operative details: After obtaining informed consent, the patient was taken the PV lab. The patient was placed in supine position the Angio table. Both groins were prepped and draped in usual sterile fashion. Local anesthesia was infiltrated over the left common femoral artery. Ultrasound was used to identify the left common femoral artery. An introducer needle was then used to cannulate the left common femoral artery. An 035 first core was threaded up the abdominal aorta under fluoroscopic guidance. A 6 French sheath was placed over the guidewire and the left common femoral artery. Next a 5 French pigtail catheter was placed over the guidewire and the abdominal aorta. AP and lateral projections of the abdominal aorta were obtained to determine the anatomy of the mesenteric vessels. There is a celiac artery stent. There is 70% narrowing just proximal scan in the origin of the stent. The superior mesenteric artery is occluded at its origin. It reconstitutes via collaterals approximately 5 cm after the origin. The inferior mesenteric artery is not visualized. The left and right renal arteries are patent.  At this point the patient was given 7000 units of  intravenous heparin. The 5 French pigtail catheter was exchanged over a guidewire for a C-1 catheter which was used to selectively cannulate the celiac artery. I attempted to advance the versacore was out distally and the celiac artery but this would not pass very far distally. Therefore this was exchanged for an 014 stabilizer wire. I was able to advance this out distally into the more secondary tertiary branches of the celiac artery. The C1 catheter was then exchanged over the guidewire or a IMA guide catheter. This was advanced up to the origin of the celiac artery. A 5 mm x 2 cm balloon was advanced to the area of stenosis. This was inflated to nominal pressure. This was held for 1 minute period completion arteriogram showed no significant change in the stenosis. I then attempted to balloon this with a 6 x 2 balloon on 2 separate occasions and the calcium would immediately cut the balloon upon inflation. This was most likely due to heavy calcium at the origin of the artery. It was decided this point to telescope an additional stent into the prior stent and extend this into the native aorta. A 6 x 15 balloon expandable stent was brought in the operative field and advanced so that its struts just overlap to proceeding stent in the proximal aspect of the stent extended to the aortic wall. This was then inflated to nominal pressure for 30 seconds. A  completion arteriogram was performed which showed no evidence of dissection. There was haziness of the proximal aspect of the stent which I attributed to severe calcification in the artery. The guide and 014 wire were removed and the versacore wire reintroduced and the 5 French pigtail catheter placed over this. A completion arteriogram was performed through the pigtail catheter. The stent was widely patent and fully expanded. The celiac artery filled briskly. AP and lateral projections were performed which confirmed the above findings. At this point the pigtail catheter  and guidewire were removed.  The patient tolerated the procedure well and there were no complications. The patient was taken to the holding area in stable condition.  Fabienne Bruns, MD [facility] [place] Office: [phone] Pager: [phone]

## 2013-05-31 NOTE — H&P (View-Only) (Signed)
[facility] [place] HISTORY AND PHYSICAL    History of Present Illness:  Patient is a 80 y.o. year old female who presents for evaluation of abdominal pain.  She has previously had a celiac artery stent placed. 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 As far as her mesenteric ischemia is concerned she has had some abdominal pain recently. She states she has lost 20 pounds in the past year 2 years. Several weeks ago she was having pain after almost every meal. She states she has had problems with spicy foods for several years. But this recent episode was different. She has had no problems this week. She has no abdominal pain today. She is currently living at [facility]. She is on Coumadin for atrial fibrillation. Other chronic medical problems include diabetes hypertension congestive heart failure degenerative joint disease. She is also here today after a carotid duplex exam for asymptomatic carotid bruit.    Past Medical History    Diagnosis   Date    .   Diabetes mellitus   age 75    .   Hypertension       .   Arthritis       .   Joint pain       .   Atrial fibrillation       .   CHF (congestive heart failure)       .   Mesenteric ischemia       .   Acute pancreatitis       .   GI bleed       .   Peripheral vascular disease       .   GERD (gastroesophageal reflux disease)       .   Thyroid disease       .   DJD (degenerative joint disease)       .   CAD (coronary artery disease)       .   DVT (deep venous thrombosis)       .   Anemia           Past Surgical History    Procedure   Date    .   Embolectomy   2009          right leg    .   Celiac artery stent       .   Fasciotomy             right leg    .   Coronary artery bypass graft       .   Total knee arthroplasty             bilateral    .   Appendectomy       .   Tubal ligation         Review of systems: She denies shortness of breath or chest  pain  Social History History    Substance Use Topics    .   Smoking status:   Never Smoker     .   Smokeless tobacco:   Not on file    .   Alcohol Use:   No      Family History Family History    Problem   Relation   Age of Onset    .   Cancer   Mother       .   Other   Father               suicide    .   Coronary artery disease   Sister       .   Other   Sister             alzheimers      Allergies  No Known Allergies  Medications :  Current Outpatient Prescriptions on File Prior to Visit   Medication  Sig  Dispense  Refill   .  aspirin EC 81 MG tablet  Take 81 mg by mouth daily.           .  clonazePAM (KLONOPIN) 0.5 MG tablet  Take 0.5 mg by mouth daily.           Marland Kitchen  diltiazem (CARDIZEM CD) 360 MG 24 hr capsule  Take 360 mg by mouth daily.           Tery Sanfilippo Sodium (COLACE PO)  Take by mouth 2 (two) times daily.           Marland Kitchen  levothyroxine (SYNTHROID, LEVOTHROID) 50 MCG tablet  Take 50 mcg by mouth daily.           Marland Kitchen  lisinopril (PRINIVIL,ZESTRIL) 20 MG tablet  Take 20 mg by mouth daily.           .  pantoprazole (PROTONIX) 40 MG tablet  Take 40 mg by mouth daily.           .  pravastatin (PRAVACHOL) 40 MG tablet  Take 40 mg by mouth daily.           .  TORSEMIDE PO  Take 50 mg by mouth daily.           .  traMADol (ULTRAM) 50 MG tablet  Take 50 mg by mouth every 6 (six) hours as needed.           .  WARFARIN SODIUM PO  Take by mouth.              No current facility-administered medications on file prior to visit.       Physical Examination       Filed Vitals:   05/02/13 1019 05/02/13 1021  BP: 129/76 147/61  Pulse: 93   Height: 5\' 2"  (1.575 m)   Weight: 161 lb 6.4 oz (73.211 kg)   SpO2: 97%     General:  Alert and oriented, no acute distress HEENT: Normal Neck: No bruit or JVD, left carotid bruit Pulmonary: Clear to auscultation bilaterally Cardiac: Regular Rate and Rhythm without murmur Gastrointestinal: Soft, non-tender, non-distended, no mass, no  bruit Skin: No rash Extremity Pulses:  2+ radial, brachial, femoral pulses bilaterally.  Musculoskeletal: No deformity or edema     Neurologic: Upper and lower extremity motor 5/5 and symmetric.   DATA:  She had a mesenteric duplex exam performed today. This showed chronic occlusion of her superior mesenteric artery. The celiac stent had elevated velocities greater than 300 cm/s today. Carotid duplex her 60-80% right internal carotid artery stenosis 60-80% left internal carotid artery stenosis antegrade vertebral flow bilaterally. I reviewed and interpreted this study.  ASSESSMENT/PLAN: Recurrent stenosis celiac stent. The patient will have a mesenteric angiogram on February 20. We will stop her Coumadin 3 days prior to this. Possible intervention at the time of her angiogram if there is a significant narrowing. She is not really a candidate for open operation. Additionally she has bilateral moderate carotid stenosis and will need repeat carotid duplex exam in 6 months.  This benefits possible complications  and procedure details of mesenteric angiogram as well as possible reangioplasty of her stent The patient and her daughter today. These include but are not limited to gut ischemia vessel injury bleeding and infection they understand and agree to proceed.  Fabienne Bruns, MD [facility] [place] Office: [phone] Pager: [phone]

## 2013-05-31 NOTE — Interval H&P Note (Signed)
History and Physical Interval Note:  05/31/2013 8:47 AM  [name]  has presented today for surgery, with the diagnosis of PVD  The various methods of treatment have been discussed with the patient and family. After consideration of risks, benefits and other options for treatment, the patient has consented to  Procedure(s): MESSENTERIC ANGIOGRAM (N/A) as a surgical intervention .  The patient's history has been reviewed, patient examined, no change in status, stable for surgery.  I have reviewed the patient's chart and labs.  Questions were answered to the patient's satisfaction.     Jaman Aro E

## 2013-05-31 NOTE — Discharge Instructions (Signed)
Angiography, Care After °Refer to this sheet in the next few weeks. These instructions provide you with information on caring for yourself after your procedure. Your health care provider may also give you more specific instructions. Your treatment has been planned according to current medical practices, but problems sometimes occur. Call your health care provider if you have any problems or questions after your procedure.  °WHAT TO EXPECT AFTER THE PROCEDURE °After your procedure, it is typical to have the following sensations: °· Minor discomfort or tenderness and a small bump at the catheter insertion site. The bump should usually decrease in size and tenderness within 1 to 2 weeks. °· Any bruising will usually fade within 2 to 4 weeks. °HOME CARE INSTRUCTIONS  °· You may need to keep taking blood thinners if they were prescribed for you. Only take over-the-counter or prescription medicines for pain, fever, or discomfort as directed by your health care provider. °· Do not apply powder or lotion to the site. °· Do not sit in a bathtub, swimming pool, or whirlpool for 5 to 7 days. °· You may shower 24 hours after the procedure. Remove the bandage (dressing) and gently wash the site with plain soap and water. Gently pat the site dry. °· Inspect the site at least twice daily. °· Limit your activity for the first 48 hours. Do not bend, squat, or lift anything over 20 lb (9 kg) or as directed by your health care provider. °· Do not drive home if you are discharged the day of the procedure. Have someone else drive you. Follow instructions about when you can drive or return to work. °SEEK MEDICAL CARE IF: °· You get lightheaded when standing up. °· You have drainage (other than a small amount of blood on the dressing). °· You have chills. °· You have a fever. °· You have redness, warmth, swelling, or pain at the insertion site. °SEEK IMMEDIATE MEDICAL CARE IF:  °· You develop chest pain or shortness of breath, feel faint,  or pass out. °· You have bleeding, swelling larger than a walnut, or drainage from the catheter insertion site. °· You develop pain, discoloration, coldness, or severe bruising in the leg or arm that held the catheter. °· You develop bleeding from any other place, such as the bowels. You may see bright red blood in your urine or stools, or your stools may appear black and tarry. °· You have heavy bleeding from the site. If this happens, hold pressure on the site. °MAKE SURE YOU: °· Understand these instructions. °· Will watch your condition. °· Will get help right away if you are not doing well or get worse. °Document Released: 10/14/2004 Document Revised: 11/28/2012 Document Reviewed: 08/20/2012 °[employer]® Patient Information ©2014 [employer]. ° °

## 2013-06-03 ENCOUNTER — Other Ambulatory Visit: Payer: Self-pay | Admitting: *Deleted

## 2013-06-03 DIAGNOSIS — Z48812 Encounter for surgical aftercare following surgery on the circulatory system: Secondary | ICD-10-CM

## 2013-06-03 DIAGNOSIS — K551 Chronic vascular disorders of intestine: Secondary | ICD-10-CM

## 2013-06-03 DIAGNOSIS — I771 Stricture of artery: Principal | ICD-10-CM

## 2013-06-03 LAB — POCT ACTIVATED CLOTTING TIME
ACTIVATED CLOTTING TIME: 216 s
Activated Clotting Time: 182 seconds

## 2013-06-05 ENCOUNTER — Telehealth: Payer: Self-pay | Admitting: Vascular Surgery

## 2013-06-05 NOTE — Telephone Encounter (Signed)
[name]  Mesenteric angiogram  PTA of celiac artery  Stent celiac artery  She will stay on aspirin 10 days. She will resume her coumadin on Sunday. She needs followup mesenteric duplex and [name] visit in 3 months    06/05/13: patients voicemail is not set up- mailed letter, dpm

## 2013-08-30 ENCOUNTER — Encounter: Payer: Self-pay | Admitting: Family

## 2013-09-03 ENCOUNTER — Ambulatory Visit (HOSPITAL_COMMUNITY)
Admission: RE | Admit: 2013-09-03 | Discharge: 2013-09-03 | Disposition: A | Discharge status: Home / Self Care | Payer: Medicare PPO | Source: Ambulatory Visit | Attending: Family | Admitting: Family

## 2013-09-03 ENCOUNTER — Encounter: Payer: Self-pay | Admitting: Family

## 2013-09-03 ENCOUNTER — Ambulatory Visit (INDEPENDENT_AMBULATORY_CARE_PROVIDER_SITE_OTHER): Payer: Medicare PPO | Admitting: Family

## 2013-09-03 VITALS — BP 135/80 | HR 74 | Resp 16 | Ht 62.0 in | Wt 161.0 lb

## 2013-09-03 DIAGNOSIS — I771 Stricture of artery: Secondary | ICD-10-CM

## 2013-09-03 DIAGNOSIS — I6529 Occlusion and stenosis of unspecified carotid artery: Secondary | ICD-10-CM

## 2013-09-03 DIAGNOSIS — Z48812 Encounter for surgical aftercare following surgery on the circulatory system: Secondary | ICD-10-CM | POA: Insufficient documentation

## 2013-09-03 DIAGNOSIS — K55059 Acute (reversible) ischemia of intestine, part and extent unspecified: Secondary | ICD-10-CM

## 2013-09-03 DIAGNOSIS — K551 Chronic vascular disorders of intestine: Secondary | ICD-10-CM

## 2013-09-03 NOTE — Progress Notes (Signed)
Established Mesenteric Ischemia  History of Present Illness  [name] is a 80 y.o. ([birth year]) female patient of Dr. Darrick Penna who is s/p celiac artery stent placed 09/02/08 and 05/31/13. 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 She reports that she has gained 2-3 pounds since February.  She is not really a candidate for open operation. Additionally she has bilateral moderate carotid stenosis. She returns today for routine surveillance.   She is currently living at [facility]. She is on Coumadin for atrial fibrillation. Other chronic medical problems include diabetes hypertension congestive heart failure degenerative joint disease.  Pt denies any history of stroke or TIA. She takes a daily ASA and a statin. Pt denies any history of MI, but has a history of CABG x3. Pt reports DM neuropathy as manifested by shooting pains from feet that travel upwards at times. Mostly at night, her DM is diet controlled. She denies non healing wounds.   Past Medical History  Diagnosis Date  . Diabetes mellitus age 76  . Hypertension   . Arthritis   . Joint pain   . Atrial fibrillation   . CHF (congestive heart failure)   . Mesenteric ischemia   . Acute pancreatitis   . GI bleed   . Peripheral vascular disease   . GERD (gastroesophageal reflux disease)   . Thyroid disease   . DJD (degenerative joint disease)   . CAD (coronary artery disease)   . DVT (deep venous thrombosis)   . Anemia     . Carotid artery occlusion    Past Surgical History  Procedure Laterality Date  . Embolectomy  2009    right leg  . Celiac artery stent    . Fasciotomy      right leg  . Coronary artery bypass graft    . Total knee arthroplasty      bilateral  . Appendectomy    . Tubal ligation    . Joint replacement     History   Social History  . Marital Status: Widowed    Spouse Name: N/A    Number of Children: N/A  . Years of Education: N/A   Occupational History  . Not on file.   Social History Main Topics  . Smoking status: Never Smoker   . Smokeless tobacco: Never Used  . Alcohol Use: No  . Drug Use: No  . Sexual Activity: Not on file   Other Topics Concern  . Not on file   Social History Narrative  . No narrative on file   Family History  Problem Relation Age of Onset  . Cancer Mother     ovarian  . Other Father     suicide  . Coronary artery disease Sister   . Other Sister     alzheimers  . Heart disease Sister     Before age 52  . Diabetes Sister   . Cancer Daughter     spinal tumor  . Diabetes Daughter   . Hypertension Daughter   . Diabetes Son   .  Hyperlipidemia Son   . Hypertension Son    Current Outpatient Prescriptions on File Prior to Visit  Medication Sig Dispense Refill  . aspirin 325 MG buffered tablet Take 1 tablet (325 mg total) by mouth daily.  10 tablet  0  . calcium carbonate (OS-CAL - DOSED IN MG OF ELEMENTAL CALCIUM) 1250 MG tablet Take 1 tablet by mouth daily with breakfast.      . clonazePAM (KLONOPIN) 0.5 MG tablet Take 0.5 mg by mouth daily.        Marland Kitchen diltiazem (CARDIZEM CD) 360 MG 24 hr capsule Take 360 mg by mouth daily.        Marland Kitchen docusate sodium (COLACE) 100 MG capsule Take 100 mg by mouth 2 (two) times daily.      Marland Kitchen levothyroxine (SYNTHROID, LEVOTHROID) 50 MCG tablet Take 50 mcg by mouth daily.        Marland Kitchen lisinopril (PRINIVIL,ZESTRIL) 20 MG tablet Take 20 mg by mouth daily.        . Multiple Vitamin (MULTIVITAMIN WITH MINERALS) TABS  tablet Take 1 tablet by mouth daily.      . nitrofurantoin, macrocrystal-monohydrate, (MACROBID) 100 MG capsule Take 100 mg by mouth 2 (two) times daily.      . pantoprazole (PROTONIX) 40 MG tablet Take 40 mg by mouth daily.        . pravastatin (PRAVACHOL) 40 MG tablet Take 40 mg by mouth daily.        Marland Kitchen torsemide (DEMADEX) 100 MG tablet Take 50 mg by mouth daily.      . traMADol (ULTRAM) 50 MG tablet Take 50 mg by mouth every 6 (six) hours as needed for moderate pain.       Marland Kitchen warfarin (COUMADIN) 3 MG tablet Take 3-6 mg by mouth daily. Take 1 tablet on Sunday, Monday, Wednesday, Friday.  Take 2 tablets on Tuesday, Thursday, Saturday.       No current facility-administered medications on file prior to visit.   Allergies  Allergen Reactions  . Cefuroxime Anaphylaxis    Throat swelling  . Cephalexin Rash    On ROS today: see HPI for pertinent positives and negatives   Physical Examination  Filed Vitals:   09/03/13 0920  BP: 135/80  Pulse: 74  Resp: 16  Height: 5\' 2"  (1.575 m)  Weight: 161 lb (73.029 kg)  SpO2: 96%  Body mass index is 29.44 kg/(m^2).   General: A&O x 3, WDWN.  Pulmonary: Sym exp, good air movt, CTAB, no rales, rhonchi, or wheezing.  Cardiac:  irregular rhythm and rate  Vascular: Vessel Right Left  Radial 1+Palpable 1+Palpable  Carotid  with  bruit  without bruit  Aorta Not palpable N/A  Popliteal Not palpable Not palpable  PT Not Palpable Not Palpable  DP 2+Palpable 1+Palpable   Gastrointestinal: soft, NTND, -G/R, - HSM, - masses, - CVAT B.  Musculoskeletal: M/S 3/5 throughout, Extremities without ischemic changes. Rheumatoid arthritis deformities in hands.  Neurologic: Pain and light touch intact in extremities, Motor exam as listed above  Non-Invasive Vascular Imaging  Mesenteric Duplex (Date: 09/03/2013):  MESENTERIC ARTERY DUPLEX EVALUATION    INDICATION: Celiac artery stent    PREVIOUS INTERVENTION(S): Celiac artery on 09/02/08 and  05/31/13 Chronic occlusion of superior mesenteric artery stent    DUPLEX EXAM:     ARTERY PEAK SYSTOLIC VELOCITY (cm/s) IMAGE  Aorta 59 Patent  Celiac 380 Turbulent Flow  Superior Mesenteric Artery - Proximal Chronic Occlusion   Superior Mesenteric Artery - Mid Chronic Occlusion  Inferior Mesenteric Artery Not Visualized    Hepatic  Patent  Splenic  Patent     ADDITIONAL FINDINGS: Limited visualization of the abdominal vasculature due to overlying bowel gas.     IMPRESSION: 1. Patent celiac artery stent with Doppler velocities suggestive of a greater than 70% stenosis of the celiac artery, based on limited visualization. 2. Known occlusion of the superior mesenteric artery stent. 3. No significant change noted when compared to the previous exam on 05/02/13.        Medical Decision Making  RAINE BLODGETT is a 80 y.o. female who presents with:  chronic mesenteric ischemia, resolved post prandial pain since the 05/31/13 procedure. Patent celiac artery stent with Doppler velocities suggestive of a greater than 70% stenosis of the celiac artery, based on limited visualization. Known occlusion of the superior mesenteric artery stent. No significant change noted when compared to the previous exam on 05/02/13.   Based on her exam and studies, and after discussing with Dr. Arbie Cookey, I have offered the patient return to clinic in 3 months for mesenteric artery Duplex and bilateral carotid artery Duplex.  I discussed in depth with the patient the nature of atherosclerosis, and emphasized the importance of maximal medical management including strict control of blood pressure, blood glucose, and lipid levels, obtaining regular exercise, and cessation of smoking.    The patient is aware that without maximal medical management the underlying atherosclerotic disease process will progress, limiting the benefit of any interventions. The patient is currently  on a statin.   The patient is currently   on an anti-platelet: ASA.    Thank you for allowing [place] to participate in this patient's care.  Charisse March, RN, MSN, FNP-C [facility] Office: [phone]  Clinic MD: Early  09/03/2013, 9:19 AM

## 2013-09-03 NOTE — Patient Instructions (Signed)
Stroke Prevention Some medical conditions and behaviors are associated with an increased chance of having a stroke. You may prevent a stroke by making healthy choices and managing medical conditions. HOW CAN I REDUCE MY RISK OF HAVING A STROKE?   Stay physically active. Get at least 30 minutes of activity on most or all days.  Do not smoke. It may also be helpful to avoid exposure to secondhand smoke.  Limit alcohol use. Moderate alcohol use is considered to be:  No more than 2 drinks per day for men.  No more than 1 drink per day for nonpregnant women.  Eat healthy foods. This involves  Eating 5 or more servings of fruits and vegetables a day.  Following a diet that addresses high blood pressure (hypertension), high cholesterol, diabetes, or obesity.  Manage your cholesterol levels.  A diet low in saturated fat, trans fat, and cholesterol and high in fiber may control cholesterol levels.  Take any prescribed medicines to control cholesterol as directed by your health care provider.  Manage your diabetes.  A controlled-carbohydrate, controlled-sugar diet is recommended to manage diabetes.  Take any prescribed medicines to control diabetes as directed by your health care provider.  Control your hypertension.  A low-salt (sodium), low-saturated fat, low-trans fat, and low-cholesterol diet is recommended to manage hypertension.  Take any prescribed medicines to control hypertension as directed by your health care provider.  Maintain a healthy weight.  A reduced-calorie, low-sodium, low-saturated fat, low-trans fat, low-cholesterol diet is recommended to manage weight.  Stop drug abuse.  Avoid taking birth control pills.  Talk to your health care provider about the risks of taking birth control pills if you are over 76 years old, smoke, get migraines, or have ever had a blood clot.  Get evaluated for sleep disorders (sleep apnea).  Talk to your health care provider about  getting a sleep evaluation if you snore a lot or have excessive sleepiness.  Take medicines as directed by your health care provider.  For some people, aspirin or blood thinners (anticoagulants) are helpful in reducing the risk of forming abnormal blood clots that can lead to stroke. If you have the irregular heart rhythm of atrial fibrillation, you should be on a blood thinner unless there is a good reason you cannot take them.  Understand all your medicine instructions.  Make sure that other other conditions (such as anemia or atherosclerosis) are addressed. SEEK IMMEDIATE MEDICAL CARE IF:   You have sudden weakness or numbness of the face, arm, or leg, especially on one side of the body.  Your face or eyelid droops to one side.  You have sudden confusion.  You have trouble speaking (aphasia) or understanding.  You have sudden trouble seeing in one or both eyes.  You have sudden trouble walking.  You have dizziness.  You have a loss of balance or coordination.  You have a sudden, severe headache with no known cause.  You have new chest pain or an irregular heartbeat. Any of these symptoms may represent a serious problem that is an emergency. Do not wait to see if the symptoms will go away. Get medical help at once. Call your local emergency services  (911 in [place] Do not drive yourself to the hospital. Document Released: 05/05/2004 Document Revised: 01/16/2013 Document Reviewed: 09/28/2012 [facility] Patient Information 2014 [place], [place].   Chronic Mesenteric Ischemia Mesenteric ischemia is a deficiency of blood in an area of the intestine supplied by an artery that supports the intestine. Chronic  mesenteric ischemia, also called intestinal angina, is a long-term condition. It happens when an artery or vein that supports the intestine gradually becomes blocked or narrow, restricting the blood supply to the intestine. When the blood supply to the intestine is severely  restricted, the intestines cannot function properly because needed oxygen cannot reach them.  CAUSES   Fatty deposits that build up in an artery or vein but have not yet restricted blood flow entirely.  Differences in some people's anatomy.  Rapid weight loss.  Weakened areas in blood vessel walls (aneurysms).  Swelling and inflammation of blood vessels (such as from fibromuscular dysplasia and arteritis).  Disorders of blood clotting.  Scarring and fibrosis of blood vessels after radiation therapy.  Blood vessel problems after drug use, such as use of cocaine. RISK FACTORS  Being female.  Being over age 58 with a history of coronary or vascular disease.  Smoking.  Congestive heart failure.  Diabetes.  High cholesterol.  High blood pressure (hypertension). SIGNS AND SYMPTOMS   Severe stomachache. Some people become fearful of eating because of pain.   Abdominal pain or cramps that develop about 30 minutes after a meal.   Abdominal pain after eating that becomes worse over time.   Diarrhea.   Nausea.   Vomiting.   Bloating.   Weight loss. DIAGNOSIS  Chronic mesenteric ischemia is often diagnosed after the person's history is taken, a physical exam is done, and tests are taken. Tests may include:  Ultrasounds.  CT scans.  Angiography. This is an imaging test that uses a dye to obtain a picture of blood flow to the intestine.  Endoscopy. This involves putting a scope through the mouth, down the throat, and into the stomach and intestine to view the intestinal wall and take small tissue samples (biopsies).  Tonometry. In this test a tiny probe is passed through the mouth and into the stomach or intestine and left in place for 24 hours or more. It measures the output of carbon dioxide by the affected tissues. TREATMENT  Treatment may include:   Medicines to reduce blood clotting and increase blood flow.   Surgery to remove the blockage, repair  arteries or veins, and restore blood flow. This may involve:   Angioplasty. This is surgery to widen the affected artery, reduce the blockage, and sometimes insert a small, mesh tube (stent).   Bypass surgery. This may be performed to bypass the blockage and reconnect healthy arteries or veins.   A stent in the affected area to help keep blocked arteries open. HOME CARE INSTRUCTIONS  Only take over-the-counter or prescription medicines as directed by your health care provider.   Keep all follow-up appointments as directed by your health care provider.   Prevent the condition from occurring by:  Doing regular exercise.  Keeping a healthy weight.  Keeping a healthy diet.  Managing cholesterol levels.  Keeping blood pressure and heart rhythm problems under control.  Not smoking. SEEK IMMEDIATE MEDICAL CARE IF:  You have severe abdominal pain.   You notice blood in your stool.   You have nausea, vomiting, or diarrhea.   You have a fever. MAKE SURE YOU:  Understand these instructions.  Will watch your condition.  Will get help right away if you are not doing well or get worse. Document Released: 11/15/2010 Document Revised: 11/28/2012 Document Reviewed: 09/26/2012 [facility] Patient Information 2014 [place], [place].

## 2013-12-05 ENCOUNTER — Other Ambulatory Visit (HOSPITAL_COMMUNITY): Payer: Medicare PPO

## 2013-12-05 ENCOUNTER — Ambulatory Visit: Payer: Medicare PPO | Admitting: Vascular Surgery

## 2013-12-11 ENCOUNTER — Encounter: Payer: Self-pay | Admitting: Vascular Surgery

## 2013-12-12 ENCOUNTER — Ambulatory Visit (INDEPENDENT_AMBULATORY_CARE_PROVIDER_SITE_OTHER): Payer: Medicare PPO | Admitting: Vascular Surgery

## 2013-12-12 ENCOUNTER — Ambulatory Visit (HOSPITAL_COMMUNITY)
Admission: RE | Admit: 2013-12-12 | Discharge: 2013-12-12 | Disposition: A | Discharge status: Home / Self Care | Payer: Medicare PPO | Source: Ambulatory Visit | Attending: Family | Admitting: Family

## 2013-12-12 ENCOUNTER — Encounter: Payer: Self-pay | Admitting: Vascular Surgery

## 2013-12-12 ENCOUNTER — Ambulatory Visit (INDEPENDENT_AMBULATORY_CARE_PROVIDER_SITE_OTHER)
Admission: RE | Admit: 2013-12-12 | Discharge: 2013-12-12 | Disposition: A | Discharge status: Home / Self Care | Payer: Medicare PPO | Source: Ambulatory Visit | Attending: Family | Admitting: Family

## 2013-12-12 VITALS — BP 167/66 | HR 84 | Temp 98.3°F | Resp 20 | Ht 62.0 in | Wt 164.0 lb

## 2013-12-12 DIAGNOSIS — I714 Abdominal aortic aneurysm, without rupture, unspecified: Secondary | ICD-10-CM | POA: Insufficient documentation

## 2013-12-12 DIAGNOSIS — K55059 Acute (reversible) ischemia of intestine, part and extent unspecified: Secondary | ICD-10-CM | POA: Diagnosis present

## 2013-12-12 DIAGNOSIS — Z9889 Other specified postprocedural states: Secondary | ICD-10-CM | POA: Diagnosis not present

## 2013-12-12 DIAGNOSIS — K551 Chronic vascular disorders of intestine: Secondary | ICD-10-CM

## 2013-12-12 DIAGNOSIS — I6529 Occlusion and stenosis of unspecified carotid artery: Secondary | ICD-10-CM

## 2013-12-12 DIAGNOSIS — Z48812 Encounter for surgical aftercare following surgery on the circulatory system: Secondary | ICD-10-CM

## 2013-12-12 DIAGNOSIS — I774 Celiac artery compression syndrome: Secondary | ICD-10-CM | POA: Diagnosis not present

## 2013-12-12 NOTE — Progress Notes (Signed)
Established Mesenteric Ischemia  History of Present Illness  [name] is a 80 y.o. (18-Jun-1929) female patient  who is s/p celiac artery stent placed 09/02/08 and restented 05/31/13. 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  Since the above procedure pt states she has had no more post prandial abdominal pain, did have post prandial abdominal pain prior to this. She states her weight has either increased or stayed the same  She is not really a candidate for open operation. Additionally she has bilateral moderate carotid stenosis. She returns today for routine surveillance.   She is currently living at [facility]. She is on Coumadin for atrial fibrillation. Other chronic medical problems include diabetes hypertension congestive heart failure degenerative joint disease.   Pt denies any history of stroke or TIA. She takes a daily ASA and a statin. Pt denies any history of MI, but has a history of CABG x3. Pt reports DM neuropathy as manifested by shooting pains from feet that travel upwards at times. Mostly at night, her DM is diet controlled. She denies non healing wounds.     Past Medical History   Diagnosis  Date   .  Diabetes mellitus  age 69   .  Hypertension     .  Arthritis     .  Joint pain     .  Atrial fibrillation     .  CHF (congestive heart failure)     .  Mesenteric ischemia     .  Acute pancreatitis     .  GI bleed     .  Peripheral vascular disease     .  GERD (gastroesophageal reflux disease)     .  Thyroid disease     .  DJD (degenerative joint disease)     .  CAD (coronary artery disease)     .  DVT (deep venous thrombosis)     .  Anemia     .  Carotid artery occlusion        Past Surgical History   Procedure  Laterality  Date   .  Embolectomy    2009       right leg   .  Celiac artery stent       .  Fasciotomy           right leg   .  Coronary artery bypass graft       .  Total knee arthroplasty            bilateral   .  Appendectomy       .  Tubal ligation       .  Joint replacement          History      Social History   .  Marital Status:  Widowed       Spouse Name:  N/A       Number of Children:  N/A   .  Years of Education:  N/A      Occupational History   .  Not on file.      Social History Main Topics   .  Smoking status:  Never Smoker    .  Smokeless tobacco:  Never Used   .  Alcohol Use:  No   .  Drug Use:  No   .  Sexual Activity:  Not on file      Other Topics  Concern   .  Not on file      Social History Narrative   .  No narrative on file      Family History   Problem  Relation  Age of Onset   .  Cancer  Mother         ovarian   .  Other  Father         suicide   .  Coronary artery disease  Sister     .  Other  Sister         alzheimers   .  Heart disease  Sister         Before age 44   .  Diabetes  Sister     .  Cancer  Daughter         spinal tumor   .  Diabetes  Daughter     .  Hypertension  Daughter     .  Diabetes  Son     .  Hyperlipidemia  Son     .  Hypertension  Son        Current Outpatient Prescriptions on File Prior to Visit   Medication  Sig  Dispense  Refill   .  aspirin 325 MG buffered tablet  Take 1 tablet (325 mg total) by mouth daily.   10 tablet   0   .  calcium carbonate (OS-CAL - DOSED IN MG OF ELEMENTAL CALCIUM) 1250 MG tablet  Take 1 tablet by mouth daily with breakfast.         .  clonazePAM (KLONOPIN) 0.5 MG tablet  Take 0.5 mg by mouth daily.           Marland Kitchen  diltiazem (CARDIZEM CD) 360 MG 24 hr capsule  Take 360 mg by mouth daily.           Marland Kitchen  docusate sodium (COLACE) 100 MG capsule  Take 100 mg by mouth 2 (two) times daily.         Marland Kitchen  levothyroxine (SYNTHROID, LEVOTHROID) 50 MCG tablet  Take 50 mcg by mouth daily.           Marland Kitchen  lisinopril (PRINIVIL,ZESTRIL) 20 MG tablet  Take 20 mg by mouth daily.           .  Multiple Vitamin (MULTIVITAMIN WITH MINERALS) TABS tablet  Take 1 tablet by mouth daily.         .  nitrofurantoin,  macrocrystal-monohydrate, (MACROBID) 100 MG capsule  Take 100 mg by mouth 2 (two) times daily.         .  pantoprazole (PROTONIX) 40 MG tablet  Take 40 mg by mouth daily.           .  pravastatin (PRAVACHOL) 40 MG tablet  Take 40 mg by mouth daily.           Marland Kitchen  torsemide (DEMADEX) 100 MG tablet  Take 50 mg by mouth daily.         .  traMADol (ULTRAM) 50 MG tablet  Take 50 mg by mouth every 6 (six) hours as needed for moderate pain.          Marland Kitchen  warfarin (COUMADIN) 3 MG tablet  Take 3-6 mg by mouth daily. Take 1 tablet on Sunday, Monday, Wednesday, Friday.  Take 2 tablets on Tuesday, Thursday, Saturday.            No current facility-administered medications on file prior to visit.      Allergies  Allergen  Reactions   .  Cefuroxime  Anaphylaxis       Throat swelling   .  Cephalexin  Rash     On ROS today: see HPI for pertinent positives and negatives   Physical Examination     Filed Vitals:   12/12/13 1104  BP: 167/66  Pulse: 84  Temp: 98.3 F (36.8 C)  TempSrc: Oral  Resp: 20  Height: 5\' 2"  (1.575 m)  Weight: 164 lb (74.39 kg)  SpO2: 98%   General: A&O x 3, WDWN.  Cardiac:  irregular rhythm and rate  Gastrointestinal: soft, NTND, no mass no bruit   Data: Patient had a mesenteric duplex scan today. This again shows chronic occlusion of her superior mesenteric artery. Velocities in the celiac artery were 349 cm/s.  Previously they were 380 cm/s 3 months ago. Essentially unchanged.  The patient also had bilateral carotid duplex scan which was 60-80% bilaterally which is unchanged from January 2015. I reviewed and interpreted both of these studies.  Assessment: Stable bilateral moderate carotid stenosis. 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  Plan: The patient will followup in 6 months with a repeat mesenteric duplex scan. We will consider repeating her carotid duplex scan in 1 year.  February 2015, MD [facility]  [place] Office: [phone] Pager: [phone]

## 2013-12-12 NOTE — Addendum Note (Signed)
Addended by: Sharee Pimple on: 12/12/2013 01:14 PM   Modules accepted: Orders

## 2014-03-20 ENCOUNTER — Encounter (HOSPITAL_COMMUNITY): Payer: Self-pay | Admitting: Vascular Surgery

## 2014-05-19 LAB — CBC AND DIFFERENTIAL: Hemoglobin: 10.7 g/dL — AB (ref 12.0–16.0)

## 2014-05-19 LAB — BASIC METABOLIC PANEL
BUN: 37 mg/dL — AB (ref 4–21)
CREATININE: 1.8 mg/dL — AB (ref 0.5–1.1)
Glucose: 120 mg/dL
POTASSIUM: 4.7 mmol/L (ref 3.4–5.3)
SODIUM: 138 mmol/L (ref 137–147)

## 2014-05-19 LAB — COMPREHENSIVE METABOLIC PANEL
EGFR (African American): 29
EGFR (Non-African Amer.): 25

## 2014-05-19 LAB — CBC WITH DIFFERENTIAL: MCV: 79

## 2014-05-19 LAB — TSH: TSH: 0.96 u[IU]/mL (ref 0.41–5.90)

## 2014-05-19 LAB — HEPATIC FUNCTION PANEL: BILIRUBIN, TOTAL: 0.3 mg/dL

## 2014-06-11 ENCOUNTER — Encounter: Payer: Self-pay | Admitting: Vascular Surgery

## 2014-06-12 ENCOUNTER — Encounter: Payer: Self-pay | Admitting: Vascular Surgery

## 2014-06-12 ENCOUNTER — Ambulatory Visit (INDEPENDENT_AMBULATORY_CARE_PROVIDER_SITE_OTHER): Payer: Medicare PPO | Admitting: Vascular Surgery

## 2014-06-12 ENCOUNTER — Ambulatory Visit (HOSPITAL_COMMUNITY)
Admission: RE | Admit: 2014-06-12 | Discharge: 2014-06-12 | Disposition: A | Discharge status: Home / Self Care | Payer: Medicare PPO | Source: Ambulatory Visit | Attending: Vascular Surgery | Admitting: Vascular Surgery

## 2014-06-12 VITALS — BP 130/53 | HR 88 | Temp 98.3°F | Resp 14 | Ht 62.0 in | Wt 158.0 lb

## 2014-06-12 DIAGNOSIS — K551 Chronic vascular disorders of intestine: Secondary | ICD-10-CM

## 2014-06-12 DIAGNOSIS — Z48812 Encounter for surgical aftercare following surgery on the circulatory system: Secondary | ICD-10-CM | POA: Insufficient documentation

## 2014-06-12 DIAGNOSIS — K550 Acute vascular disorders of intestine: Secondary | ICD-10-CM

## 2014-06-12 NOTE — Progress Notes (Signed)
History of Present Illness  [name] is a 81 y.o. female who is s/p celiac artery stent placed 09/02/08 and restented 05/31/13. 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  Since the above procedure pt states she has had no more post prandial abdominal pain, did have post prandial abdominal pain prior to this. She states her weight has either increased or stayed the same  She is not really a candidate for open operation. Additionally she has bilateral moderate carotid stenosis. She returns today for routine surveillance.   She is currently living at [facility]. She is on Coumadin for atrial fibrillation. Other chronic medical problems include diabetes hypertension congestive heart failure degenerative joint disease.    Pt denies any history of stroke or TIA. She takes a daily ASA and a statin. Pt denies any history of MI, but has a history of CABG x3. Pt reports DM neuropathy as manifested by shooting pains from feet that travel upwards at times. Mostly at night, her DM is diet controlled. She denies non healing wounds.     Past Medical History    Diagnosis   Date    .   Diabetes mellitus   age 46    .   Hypertension       .   Arthritis       .   Joint pain       .   Atrial fibrillation       .   CHF (congestive heart failure)       .   Mesenteric ischemia       .   Acute pancreatitis       .   GI bleed       .   Peripheral vascular disease       .   GERD (gastroesophageal reflux disease)       .   Thyroid disease       .   DJD (degenerative joint disease)       .   CAD (coronary artery disease)       .   DVT (deep venous thrombosis)       .   Anemia       .   Carotid artery occlusion          Past Surgical History    Procedure   Laterality   Date    .   Embolectomy      2009          right leg    .   Celiac artery stent          .   Fasciotomy                right leg    .   Coronary artery bypass graft          .    Total knee arthroplasty                bilateral    .   Appendectomy          .   Tubal ligation          .   Joint replacement             History       Social History    .   Marital Status:   Widowed          Spouse Name:  N/A          Number of Children:   N/A    .   Years of Education:   N/A       Occupational History    .   Not on file.       Social History Main Topics    .   Smoking status:   Never Smoker     .   Smokeless tobacco:   Never Used    .   Alcohol Use:   No    .   Drug Use:   No    .   Sexual Activity:   Not on file       Other Topics   Concern    .   Not on file       Social History Narrative    .   No narrative on file       Family History    Problem   Relation   Age of Onset    .   Cancer   Mother             ovarian    .   Other   Father             suicide    .   Coronary artery disease   Sister       .   Other   Sister             alzheimers    .   Heart disease   Sister             Before age 59    .   Diabetes   Sister       .   Cancer   Daughter             spinal tumor    .   Diabetes   Daughter       .   Hypertension   Daughter       .   Diabetes   Son       .   Hyperlipidemia   Son       .   Hypertension   Son          Current Outpatient Prescriptions on File Prior to Visit    Medication   Sig   Dispense   Refill    .   aspirin 325 MG buffered tablet   Take 1 tablet (325 mg total) by mouth daily.    10 tablet    0    .   calcium carbonate (OS-CAL - DOSED IN MG OF ELEMENTAL CALCIUM) 1250 MG tablet   Take 1 tablet by mouth daily with breakfast.            .   clonazePAM (KLONOPIN) 0.5 MG tablet   Take 0.5 mg by mouth daily.              Marland Kitchen   diltiazem (CARDIZEM CD) 360 MG 24 hr capsule   Take 360 mg by mouth daily.              Marland Kitchen   docusate sodium (COLACE) 100 MG capsule   Take 100 mg by mouth 2 (two) times daily.            Marland Kitchen   levothyroxine (SYNTHROID, LEVOTHROID) 50 MCG tablet   Take 50 mcg by mouth daily.              Marland Kitchen  lisinopril (PRINIVIL,ZESTRIL) 20 MG tablet   Take 20 mg by mouth daily.              .   Multiple Vitamin (MULTIVITAMIN WITH MINERALS) TABS tablet   Take 1 tablet by mouth daily.            .   nitrofurantoin, macrocrystal-monohydrate, (MACROBID) 100 MG capsule   Take 100 mg by mouth 2 (two) times daily.            .   pantoprazole (PROTONIX) 40 MG tablet   Take 40 mg by mouth daily.              .   pravastatin (PRAVACHOL) 40 MG tablet   Take 40 mg by mouth daily.              Marland Kitchen   torsemide (DEMADEX) 100 MG tablet   Take 50 mg by mouth daily.            .   traMADol (ULTRAM) 50 MG tablet   Take 50 mg by mouth every 6 (six) hours as needed for moderate pain.             Marland Kitchen   warfarin (COUMADIN) 3 MG tablet   Take 3-6 mg by mouth daily. Take 1 tablet on Sunday, Monday, Wednesday, Friday.  Take 2 tablets on Tuesday, Thursday, Saturday.               No current facility-administered medications on file prior to visit.       Allergies    Allergen   Reactions    .   Cefuroxime   Anaphylaxis          Throat swelling    .   Cephalexin   Rash      On ROS today: see HPI for pertinent positives and negatives   Physical Examination       Filed Vitals:   06/12/14 0956  BP: 130/53  Pulse: 88  Temp: 98.3 F (36.8 C)  TempSrc: Oral  Resp: 14  Height: 5\' 2"  (1.575 m)  Weight: 158 lb (71.668 kg)  SpO2: 93%   General: A&O x 3, WDWN.  Cardiac:  irregular rhythm and rate  Gastrointestinal: soft, NTND, no mass no bruit   Data: Patient had a mesenteric duplex scan today. Velocity across the celiac stent today was 380 cm/s.   Previously they were 380 cm/s. Essentially unchanged.  The patient also had bilateral carotid duplex scan which was 60-80% bilaterally which is unchanged from January 2015. I reviewed and interpreted both of these studies.  Assessment:  Stable moderate restenosis of celiac artery asymptomatic.  Plan: The patient will followup in 6 months with a repeat mesenteric duplex  scan. We will consider repeating her carotid duplex scan in 6 months.  February 2015, MD [facility] Office: [phone] Pager: [phone]

## 2014-07-01 LAB — HEMOGLOBIN A1C: HEMOGLOBIN A1C: 6.6

## 2014-08-27 ENCOUNTER — Ambulatory Visit (INDEPENDENT_AMBULATORY_CARE_PROVIDER_SITE_OTHER): Payer: Medicare PPO | Admitting: Podiatrist

## 2014-08-27 ENCOUNTER — Encounter: Payer: Self-pay | Admitting: Podiatrist

## 2014-08-27 VITALS — BP 129/55 | HR 87 | Resp 16

## 2014-08-27 DIAGNOSIS — E1140 Type 2 diabetes mellitus with diabetic neuropathy, unspecified: Secondary | ICD-10-CM | POA: Diagnosis not present

## 2014-08-27 DIAGNOSIS — B351 Tinea unguium: Secondary | ICD-10-CM

## 2014-08-27 DIAGNOSIS — L84 Corns and callosities: Secondary | ICD-10-CM | POA: Diagnosis not present

## 2014-08-27 DIAGNOSIS — M79609 Pain in unspecified limb: Secondary | ICD-10-CM

## 2014-08-27 NOTE — Patient Instructions (Signed)
Diabetes and Foot Care Diabetes may cause you to have problems because of poor blood supply (circulation) to your feet and legs. This may cause the skin on your feet to become thinner, break easier, and heal more slowly. Your skin may become dry, and the skin may peel and crack. You may also have nerve damage in your legs and feet causing decreased feeling in them. You may not notice minor injuries to your feet that could lead to infections or more serious problems. Taking care of your feet is one of the most important things you can do for yourself.  HOME CARE INSTRUCTIONS  Wear shoes at all times, even in the house. Do not go barefoot. Bare feet are easily injured.  Check your feet daily for blisters, cuts, and redness. If you cannot see the bottom of your feet, use a mirror or ask someone for help.  Wash your feet with warm water (do not use hot water) and mild soap. Then pat your feet and the areas between your toes until they are completely dry. Do not soak your feet as this can dry your skin.  Apply a moisturizing lotion or petroleum jelly (that does not contain alcohol and is unscented) to the skin on your feet and to dry, brittle toenails. Do not apply lotion between your toes.  Trim your toenails straight across. Do not dig under them or around the cuticle. File the edges of your nails with an emery board or nail file.  Do not cut corns or calluses or try to remove them with medicine.  Wear clean socks or stockings every day. Make sure they are not too tight. Do not wear knee-high stockings since they may decrease blood flow to your legs.  Wear shoes that fit properly and have enough cushioning. To break in new shoes, wear them for just a few hours a day. This prevents you from injuring your feet. Always look in your shoes before you put them on to be sure there are no objects inside.  Do not cross your legs. This may decrease the blood flow to your feet.  If you find a minor scrape,  cut, or break in the skin on your feet, keep it and the skin around it clean and dry. These areas may be cleansed with mild soap and water. Do not cleanse the area with peroxide, alcohol, or iodine.  When you remove an adhesive bandage, be sure not to damage the skin around it.  If you have a wound, look at it several times a day to make sure it is healing.  Do not use heating pads or hot water bottles. They may burn your skin. If you have lost feeling in your feet or legs, you may not know it is happening until it is too late.  Make sure your health care provider performs a complete foot exam at least annually or more often if you have foot problems. Report any cuts, sores, or bruises to your health care provider immediately. SEEK MEDICAL CARE IF:   You have an injury that is not healing.  You have cuts or breaks in the skin.  You have an ingrown nail.  You notice redness on your legs or feet.  You feel burning or tingling in your legs or feet.  You have pain or cramps in your legs and feet.  Your legs or feet are numb.  Your feet always feel cold. SEEK IMMEDIATE MEDICAL CARE IF:   There is increasing redness,   swelling, or pain in or around a wound.  There is a red line that goes up your leg.  Pus is coming from a wound.  You develop a fever or as directed by your health care provider.  You notice a bad smell coming from an ulcer or wound. Document Released: 03/25/2000 Document Revised: 11/28/2012 Document Reviewed: 09/04/2012 Exit[employer] Patient Information 2015 [employer]. This information is not intended to replace advice given to you by your health care provider. Make sure you discuss any questions you have with your health care provider.  
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
## 2014-09-25 ENCOUNTER — Ambulatory Visit: Payer: Medicare PPO | Admitting: Podiatry

## 2014-09-30 ENCOUNTER — Encounter: Payer: Self-pay | Admitting: Podiatry

## 2014-09-30 ENCOUNTER — Ambulatory Visit (INDEPENDENT_AMBULATORY_CARE_PROVIDER_SITE_OTHER): Payer: Medicare PPO | Admitting: Podiatry

## 2014-09-30 DIAGNOSIS — Q828 Other specified congenital malformations of skin: Secondary | ICD-10-CM | POA: Diagnosis not present

## 2014-09-30 DIAGNOSIS — B351 Tinea unguium: Secondary | ICD-10-CM

## 2014-09-30 DIAGNOSIS — E1140 Type 2 diabetes mellitus with diabetic neuropathy, unspecified: Secondary | ICD-10-CM | POA: Diagnosis not present

## 2014-09-30 NOTE — Progress Notes (Signed)
Subjective:     Patient ID: [name], female   DOB: Aug 23, 1929, 81 y.o.   MRN: [id number]  HPIThis patient presents to the office for p[reventive foot care services.  She has thick painful nails as well as porokeratosis on bottom of both feet.  She says she has RA and has nodules on the bottom of both feet.  She wears diabetic shoes with diabetic insoles.  She believes she has neuropathy for which she has taken  Gabapentin in the past.  She presents for preventive foot care services.   Review of Systems     Objective:   Physical Exam Objective: Review of past medical history, medications, social history and allergies were performed.  Vascular: Dorsalis pedis and posterior tibial pulses were palpable B/L, capillary refill was  WNL B/L, temperature gradient was WNL B/L   Skin:  Porokeratisis under rhumatoid nodules under metatarsal heads both feet.  Nails: appear healthy with no signs of mycosis or infections  Sensory: Semmes Weinstein monifilament WNL   Orthopedic: Orthopedic evaluation demonstrates all joints distal t ankle have full ROM without crepitus, muscle power WNL B/L     Assessment:     Onychomycosis B/L  Porokeratosis B/L  Diabetic neuropathy with PVD     Plan:     Debridement of naols.  Debridement of porokeratosis.  Checked on diabetic insoles and gabapentin cream for this patient per Misty Stanley.

## 2014-10-28 ENCOUNTER — Encounter: Payer: Self-pay | Admitting: Podiatry

## 2014-10-28 ENCOUNTER — Ambulatory Visit (INDEPENDENT_AMBULATORY_CARE_PROVIDER_SITE_OTHER): Payer: Medicare PPO | Admitting: Podiatry

## 2014-10-28 DIAGNOSIS — Q828 Other specified congenital malformations of skin: Secondary | ICD-10-CM | POA: Diagnosis not present

## 2014-10-28 DIAGNOSIS — E1140 Type 2 diabetes mellitus with diabetic neuropathy, unspecified: Secondary | ICD-10-CM | POA: Diagnosis not present

## 2014-10-28 DIAGNOSIS — M79609 Pain in unspecified limb: Secondary | ICD-10-CM

## 2014-10-28 DIAGNOSIS — B351 Tinea unguium: Secondary | ICD-10-CM | POA: Diagnosis not present

## 2014-10-28 NOTE — Progress Notes (Signed)
Subjective:     Patient ID: [name], female   DOB: 01/14/1930, 81 y.o.   MRN: [id number]  HPIThis patient presents to the office for preventive foot care services.  She has thick painful nails as well as porokeratosis both feet.  She has RA with rhumatoid nodules forefeet B/L She presents for preventive foot care services.   Review of Systems     Objective:   Physical Exam GENERAL APPEARANCE: Alert, conversant. Appropriately groomed. No acute distress.  VASCULAR: Pedal pulses palpable at 2/4 DP and PT bilateral.  Capillary refill time is immediate to all digits,  Proximal to distal cooling it warm to warm.  Digital hair growth is present bilateral  NEUROLOGIC: sensation is intact epicritically and protectively to 5.07 monofilament at 5/5 sites bilateral.  Light touch is intact bilateral, vibratory sensation intact bilateral, achilles tendon reflex is intact bilateral.  MUSCULOSKELETAL: contracted digits both feet.  Heloma durum 4th left foot and HD 5th right foot..  Prominent metatarsals both feet due to RA. DERMATOLOGIC: Porokeratosis under rhumatoid nodules both forefeet. No sins of infections or ulcers.  No drainage noted. NAILS  Thick disfigured discolored nails both feet.     Assessment:     Onychomycosis  B/L  Porokewratosis B/L     Plan:     Debridement of nails.  Debridement of porokeratosis.

## 2014-11-04 ENCOUNTER — Ambulatory Visit: Payer: Medicare PPO | Admitting: Podiatry

## 2014-11-25 ENCOUNTER — Ambulatory Visit: Payer: Medicare PPO | Admitting: Podiatry

## 2014-11-26 ENCOUNTER — Ambulatory Visit (INDEPENDENT_AMBULATORY_CARE_PROVIDER_SITE_OTHER): Payer: Medicare PPO | Admitting: Podiatry

## 2014-11-26 ENCOUNTER — Encounter: Payer: Self-pay | Admitting: Podiatry

## 2014-11-26 ENCOUNTER — Ambulatory Visit: Payer: Medicare PPO | Admitting: Podiatry

## 2014-11-26 VITALS — BP 125/70 | HR 69 | Resp 17

## 2014-11-26 DIAGNOSIS — B351 Tinea unguium: Secondary | ICD-10-CM | POA: Diagnosis not present

## 2014-11-26 DIAGNOSIS — Q828 Other specified congenital malformations of skin: Secondary | ICD-10-CM

## 2014-11-26 DIAGNOSIS — M79673 Pain in unspecified foot: Secondary | ICD-10-CM | POA: Diagnosis not present

## 2014-11-26 DIAGNOSIS — M79609 Pain in unspecified limb: Principal | ICD-10-CM

## 2014-11-26 NOTE — Progress Notes (Signed)
Subjective:     Patient ID: [name], female   DOB: 05/23/1929, 81 y.o.   MRN: [id number]  HPIThis patient presents to the office for preventive foot care services.  She has thick painful nails as well as porokeratosis both feet.  She has RA with rhumatoid nodules forefeet B/L She presents for preventive foot care services.   Review of Systems     Objective:   Physical Exam GENERAL APPEARANCE: Alert, conversant. Appropriately groomed. No acute distress.  VASCULAR: Pedal pulses palpable at 2/4 DP and PT bilateral.  Capillary refill time is immediate to all digits,  Proximal to distal cooling it warm to warm.  Digital hair growth is present bilateral  NEUROLOGIC: sensation is intact epicritically and protectively to 5.07 monofilament at 5/5 sites bilateral.  Light touch is intact bilateral, vibratory sensation intact bilateral, achilles tendon reflex is intact bilateral.  MUSCULOSKELETAL: contracted digits both feet.  Heloma durum 4th left foot and HD 5th right foot..  Prominent metatarsals both feet due to RA. DERMATOLOGIC: Porokeratosis under rhumatoid nodules both forefeet. No sins of infections or ulcers.  No drainage noted. NAILS  Thick disfigured discolored nails both feet.     Assessment:     Onychomycosis  B/L  Porokewratosis B/L     Plan:     Debridement of nails.  Debridement of porokeratosis.        Bricyn Labrada DPM  

## 2014-12-02 ENCOUNTER — Ambulatory Visit: Payer: Medicare PPO | Admitting: Podiatry

## 2014-12-24 ENCOUNTER — Ambulatory Visit (INDEPENDENT_AMBULATORY_CARE_PROVIDER_SITE_OTHER): Payer: Medicare PPO | Admitting: Podiatry

## 2014-12-24 ENCOUNTER — Encounter: Payer: Self-pay | Admitting: Podiatry

## 2014-12-24 DIAGNOSIS — L89891 Pressure ulcer of other site, stage 1: Secondary | ICD-10-CM

## 2014-12-24 DIAGNOSIS — L089 Local infection of the skin and subcutaneous tissue, unspecified: Secondary | ICD-10-CM

## 2014-12-24 DIAGNOSIS — E1140 Type 2 diabetes mellitus with diabetic neuropathy, unspecified: Secondary | ICD-10-CM

## 2014-12-24 DIAGNOSIS — L98491 Non-pressure chronic ulcer of skin of other sites limited to breakdown of skin: Principal | ICD-10-CM

## 2014-12-24 DIAGNOSIS — B351 Tinea unguium: Secondary | ICD-10-CM

## 2014-12-24 NOTE — Progress Notes (Signed)
Subjective:     Patient ID: [name], female   DOB: May 17, 1929, 81 y.o.   MRN: [id number]  HPIThis patient returns to office with red swollen painful second toe left foot.  Patient has hammer toe second left with callus on top of toe.  She says the toe has become painful walking and wearing her shoes.  She says the toe throbs during the day and is worsening.   Review of Systems     Objective:   Physical Exam  Objective:   Physical Exam GENERAL APPEARANCE: Alert, conversant. Appropriately groomed. No acute distress.  VASCULAR: Pedal pulses palpable at 2/4 DP and PT bilateral. Capillary refill time is immediate to all digits, Proximal to distal cool.Marland Kitchen  NEUROLOGIC: sensation is intact epicritically and protectively to 5.07 monofilament at 5/5 sites bilateral. Light touch is intact bilateral, vibratory sensation intact bilateral, achilles tendon reflex is intact bilateral.  MUSCULOSKELETAL: contracted digits both feet. Heloma durum 4th left foot and HD 5th right foot.. Red swollen PIPJ second toe left foot.  Prominent metatarsals both feet due to RA. DERMATOLOGIC: Porokeratosis under rhumatoid nodules both forefeet. No sins of infections or ulcers. No drainage noted. NAILS Thick disfigured discolored nails both feet.            Assessment:     Infected ulcer second toe left foot.     Plan:     ROV  Debride skin second toe left foot.  Prescribed cephalexin 500 mg  One qid. Home soak instructions. RTC 1 wek

## 2014-12-25 ENCOUNTER — Telehealth: Payer: Self-pay

## 2014-12-25 NOTE — Telephone Encounter (Signed)
Called and spoke to Roscoe - caregiver at [facility] home, we had to call in another antibiotic for her she has a reaction to the 1st one that we wrote for her- Keflex. The home fills there own meds., so I verbally called in Doxycycline 100mg   1 BID 20 tabs - to [facility] caregiver. And she will give the order to be filled.

## 2015-01-21 ENCOUNTER — Encounter: Payer: Self-pay | Admitting: Podiatry

## 2015-01-21 ENCOUNTER — Ambulatory Visit: Payer: Medicare PPO | Admitting: Podiatry

## 2015-01-21 ENCOUNTER — Ambulatory Visit (INDEPENDENT_AMBULATORY_CARE_PROVIDER_SITE_OTHER): Payer: Medicare PPO | Admitting: Podiatry

## 2015-01-21 DIAGNOSIS — Q828 Other specified congenital malformations of skin: Secondary | ICD-10-CM

## 2015-01-21 DIAGNOSIS — B351 Tinea unguium: Secondary | ICD-10-CM | POA: Diagnosis not present

## 2015-01-21 DIAGNOSIS — M79673 Pain in unspecified foot: Secondary | ICD-10-CM | POA: Diagnosis not present

## 2015-01-21 NOTE — Progress Notes (Signed)
Subjective:     Patient ID: [name], female   DOB: 09-10-1929, 81 y.o.   MRN: [id number]  HPIThis patient presents to the office for preventive foot care services.  She has thick painful nails as well as porokeratosis both feet.  She has RA with rhumatoid nodules forefeet B/L She presents for preventive foot care services.   Review of Systems     Objective:   Physical Exam GENERAL APPEARANCE: Alert, conversant. Appropriately groomed. No acute distress.  VASCULAR: Pedal pulses palpable at 2/4 DP and PT bilateral.  Capillary refill time is immediate to all digits,  Proximal to distal cooling it warm to warm.  Digital hair growth is present bilateral  NEUROLOGIC: sensation is intact epicritically and protectively to 5.07 monofilament at 5/5 sites bilateral.  Light touch is intact bilateral, vibratory sensation intact bilateral, achilles tendon reflex is intact bilateral.  MUSCULOSKELETAL: contracted digits both feet.  Heloma durum 4th left foot and HD 5th right foot..  Prominent metatarsals both feet due to RA. DERMATOLOGIC: Porokeratosis under rhumatoid nodules both forefeet. No sins of infections or ulcers.  No drainage noted. NAILS  Thick disfigured discolored nails both feet.     Assessment:     Onychomycosis  B/L  Porokewratosis B/L     Plan:     Debridement of nails.  Debridement of porokeratosis.        Helane Gunther DPM

## 2015-01-29 ENCOUNTER — Ambulatory Visit: Payer: Medicare PPO | Admitting: Podiatry

## 2015-02-13 ENCOUNTER — Encounter: Payer: Self-pay | Admitting: Family Medicine

## 2015-02-13 ENCOUNTER — Ambulatory Visit (INDEPENDENT_AMBULATORY_CARE_PROVIDER_SITE_OTHER): Payer: Medicare PPO | Admitting: Family Medicine

## 2015-02-13 VITALS — BP 140/70 | HR 69 | Ht 62.0 in | Wt 169.0 lb

## 2015-02-13 DIAGNOSIS — I5032 Chronic diastolic (congestive) heart failure: Secondary | ICD-10-CM

## 2015-02-13 DIAGNOSIS — I482 Chronic atrial fibrillation, unspecified: Secondary | ICD-10-CM

## 2015-02-13 DIAGNOSIS — Z66 Do not resuscitate: Secondary | ICD-10-CM

## 2015-02-13 DIAGNOSIS — I679 Cerebrovascular disease, unspecified: Secondary | ICD-10-CM

## 2015-02-13 DIAGNOSIS — K551 Chronic vascular disorders of intestine: Secondary | ICD-10-CM

## 2015-02-13 DIAGNOSIS — Z7901 Long term (current) use of anticoagulants: Secondary | ICD-10-CM | POA: Diagnosis not present

## 2015-02-13 DIAGNOSIS — I739 Peripheral vascular disease, unspecified: Secondary | ICD-10-CM

## 2015-02-13 DIAGNOSIS — I701 Atherosclerosis of renal artery: Secondary | ICD-10-CM

## 2015-02-13 LAB — CBC AND DIFFERENTIAL: Hemoglobin: 10.9 g/dL — AB (ref 12.0–16.0)

## 2015-02-13 LAB — BASIC METABOLIC PANEL
CREATININE: 1.5 mg/dL — AB (ref 0.5–1.1)
Glucose: 136 mg/dL

## 2015-02-13 LAB — POCT INR: INR: 2.3

## 2015-02-13 MED ORDER — ASPIRIN EC 81 MG PO TBEC: 81.0000 mg | DELAYED_RELEASE_TABLET | Freq: Every day | ORAL | Status: DC

## 2015-02-13 NOTE — Assessment & Plan Note (Signed)
Doing well continue plan

## 2015-02-13 NOTE — Patient Instructions (Signed)
Thank you for coming in today. Return in 1 month for a nurse visit to check the INR.  Return to see me in 3 months.  I will get labs back and call with results and explanation.  Call or go to the emergency room if you get worse, have trouble breathing, have chest pains, or palpitations.

## 2015-02-13 NOTE — Progress Notes (Signed)
[name] is a 81 y.o. female who presents to [name]  today for establish care.  Patient has multiple chronic medical conditions. She lives in a senior community in [place]. She has atrial fibrillation for which she has diltiazem for rate control and warfarin for anticoagulation. She notes the warfarin is working well and she does not have many episodes of too high or too low INR.  She additionally has rheumatoid arthritis and is currently seeing a new rheumatologist Dr. Virgel Manifold in [place].  Additionally she has peripheral vascular disease and mesenteric vascular disease. She does not smoke and has never smoked. She takes pravastatin.  She has a DO NOT RESUSCITATE and an advance directive. She does not wish heroic or extensive measures to prolong her life.    Past Medical History  Diagnosis Date  . Diabetes mellitus age 78  . Hypertension   . Arthritis   . Joint pain   . Atrial fibrillation (HCC)   . CHF (congestive heart failure) (HCC)   . Mesenteric ischemia   . Acute pancreatitis   . GI bleed   . Peripheral vascular disease (HCC)   . GERD (gastroesophageal reflux disease)   . Thyroid disease   . DJD (degenerative joint disease)   . CAD (coronary artery disease)   . DVT (deep venous thrombosis) (HCC)   . Anemia   . Carotid artery occlusion    Past Surgical History  Procedure Laterality Date  . Embolectomy  2009    right leg  . Celiac artery stent    . Fasciotomy      right leg  . Coronary artery bypass graft    . Total knee arthroplasty      bilateral  . Appendectomy    . Tubal ligation    . Joint replacement    . Visceral angiogram N/A 05/31/2013    Procedure: MESSENTERIC [name];  Surgeon: [name], MD;  Location: [facility] CATH LAB;  Service: Cardiovascular;  Laterality: N/A;   Social History  Substance Use Topics  . Smoking status: Never Smoker   . Smokeless tobacco: Never Used  . Alcohol Use: No    family history includes Cancer in her daughter and mother; Coronary artery disease in her sister; Diabetes in her daughter, sister, and son; Heart disease in her sister; Hyperlipidemia in her son; Hypertension in her daughter and son; Other in her father and sister.  ROS as above Medications: Current Outpatient Prescriptions  Medication Sig Dispense Refill  . alendronate (FOSAMAX) 70 MG tablet Take 70 mg by mouth once a week. Take with a full glass of water on an empty stomach.    . calcium carbonate (OS-CAL - DOSED IN MG OF ELEMENTAL CALCIUM) 1250 MG tablet Take 1 tablet by mouth daily with breakfast.    . cholecalciferol (VITAMIN D) 1000 UNITS tablet Take 2,000 Units by mouth daily.    . clonazePAM (KLONOPIN) 0.5 MG tablet Take 0.5 mg by mouth daily.      Marland Kitchen diltiazem (CARDIZEM CD) 360 MG 24 hr capsule Take 360 mg by mouth daily.      Marland Kitchen docusate sodium (COLACE) 100 MG capsule Take 100 mg by mouth 2 (two) times daily.    . iron polysaccharides (NIFEREX) 150 MG capsule Take 150 mg by mouth daily.    Marland Kitchen leflunomide (ARAVA) 10 MG tablet Take 10 mg by mouth daily.    Marland Kitchen levothyroxine (SYNTHROID, LEVOTHROID) 50 MCG tablet Take 50 mcg by mouth daily.      Marland Kitchen  lisinopril (PRINIVIL,ZESTRIL) 20 MG tablet Take 20 mg by mouth daily.      . Multiple Vitamin (MULTIVITAMIN WITH MINERALS) TABS tablet Take 1 tablet by mouth daily.    . pantoprazole (PROTONIX) 40 MG tablet Take 40 mg by mouth daily.      . pravastatin (PRAVACHOL) 40 MG tablet Take 40 mg by mouth daily.      Marland Kitchen torsemide (DEMADEX) 100 MG tablet Take 50 mg by mouth daily.    . traMADol (ULTRAM) 50 MG tablet Take 100 mg by mouth 2 (two) times daily.     . vitamin B-12 (CYANOCOBALAMIN) 1000 MCG tablet Take 1,000 mcg by mouth daily. 1 tab 2x weekly    . warfarin (COUMADIN) 3 MG tablet Take 3-6 mg by mouth daily. Take 1 tablet on every day but thursday.  Take 2 tablets on , Thursday, .     No current facility-administered medications for this visit.    Allergies  Allergen Reactions  . Cefuroxime Anaphylaxis    Throat swelling  . Cephalexin Rash     Exam:  BP 140/70 mmHg  Pulse 69  Ht 5\' 2"  (1.575 m)  Wt 169 lb (76.658 kg)  BMI 30.90 kg/m2 Gen: Well NAD Elderly appearing woman  HEENT: EOMI,  MMM Lungs: Normal work of breathing. CTABL Heart: RRR no MRG Rhythm seemed regular per minute check  Abd: NABS, Soft. Nondistended, Nontender Exts: Brisk capillary refill, warm and well perfused.  MSK: Ulnar deviation and synovitis of the MCPs bilaterally present. She uses a rolling walker to ambulate. Psych: Alert and oriented normal speech and thought process.  Results for orders placed or performed in visit on 02/13/15 (from the past 24 hour(s))  POCT INR     Status: None   Collection Time: 02/13/15 11:36 AM  Result Value Ref Range   INR 2.3    No results found.   81 year old woman with multiple medical conditions: Overall [name] is doing pretty well. She has labs pending from her rheumatologist. Her INR is at goal. She is lives semi-independently. Plan for no changes at this time. Obtain records from previous providers. Refill medicines as needed. Recheck in 1-3 months. Recheck INR in one month.  DO NOT RESUSCITATE written and given to patient.

## 2015-02-18 ENCOUNTER — Ambulatory Visit: Payer: Medicare PPO | Admitting: Podiatry

## 2015-02-18 ENCOUNTER — Encounter: Payer: Self-pay | Admitting: Family Medicine

## 2015-02-18 DIAGNOSIS — M069 Rheumatoid arthritis, unspecified: Secondary | ICD-10-CM | POA: Insufficient documentation

## 2015-02-18 DIAGNOSIS — N183 Chronic kidney disease, stage 3 unspecified: Secondary | ICD-10-CM | POA: Insufficient documentation

## 2015-02-19 ENCOUNTER — Encounter: Payer: Self-pay | Admitting: Emergency Medicine

## 2015-02-19 LAB — ALBUMIN
ANA Ab, IFA: NEGATIVE
Albumin: 3.8
HEP B S AG: NEGATIVE
Rheumatoid Fact: 138
SED RATE: 67
VITAMIN B2: 1304

## 2015-02-24 ENCOUNTER — Encounter: Payer: Self-pay | Admitting: Family Medicine

## 2015-02-24 DIAGNOSIS — N952 Postmenopausal atrophic vaginitis: Secondary | ICD-10-CM | POA: Insufficient documentation

## 2015-02-25 ENCOUNTER — Ambulatory Visit: Payer: Medicare PPO | Admitting: Podiatry

## 2015-02-27 ENCOUNTER — Telehealth: Payer: Self-pay | Admitting: Emergency Medicine

## 2015-02-27 ENCOUNTER — Encounter: Payer: Self-pay | Admitting: Podiatry

## 2015-02-27 ENCOUNTER — Ambulatory Visit (INDEPENDENT_AMBULATORY_CARE_PROVIDER_SITE_OTHER): Payer: Medicare PPO | Admitting: Podiatry

## 2015-02-27 DIAGNOSIS — M79673 Pain in unspecified foot: Secondary | ICD-10-CM | POA: Diagnosis not present

## 2015-02-27 DIAGNOSIS — Q828 Other specified congenital malformations of skin: Secondary | ICD-10-CM | POA: Diagnosis not present

## 2015-02-27 DIAGNOSIS — B351 Tinea unguium: Secondary | ICD-10-CM

## 2015-02-27 DIAGNOSIS — N183 Chronic kidney disease, stage 3 (moderate): Principal | ICD-10-CM

## 2015-02-27 DIAGNOSIS — E1122 Type 2 diabetes mellitus with diabetic chronic kidney disease: Secondary | ICD-10-CM

## 2015-02-27 NOTE — Progress Notes (Signed)
Subjective:     Patient ID: [name], female   DOB: 03-01-1930, 81 y.o.   MRN: [id number]  HPIThis patient presents to the office for preventive foot care services.  She has thick painful nails as well as porokeratosis both feet.  She has RA with rhumatoid nodules forefeet B/L She presents for preventive foot care services.   Review of Systems     Objective:   Physical Exam GENERAL APPEARANCE: Alert, conversant. Appropriately groomed. No acute distress.  VASCULAR: Pedal pulses palpable at 2/4 DP and PT bilateral.  Capillary refill time is immediate to all digits,  Proximal to distal cooling it warm to warm.  Digital hair growth is present bilateral  NEUROLOGIC: sensation is intact epicritically and protectively to 5.07 monofilament at 5/5 sites bilateral.  Light touch is intact bilateral, vibratory sensation intact bilateral, achilles tendon reflex is intact bilateral.  MUSCULOSKELETAL: contracted digits both feet.  Heloma durum 4th left foot and HD 5th right foot..  Prominent metatarsals both feet due to RA. DERMATOLOGIC: Porokeratosis under rhumatoid nodules both forefeet. No sins of infections or ulcers.  No drainage noted. NAILS  Thick disfigured discolored nails both feet.     Assessment:     Onychomycosis  B/L  Porokewratosis B/L     Plan:     Debridement of nails.  Debridement of porokeratosis.        Helane Gunther DPM

## 2015-02-27 NOTE — Telephone Encounter (Signed)
[facility] [phone]) calling to request order for lancets and test strips for this patient: uses True [employer] meter.

## 2015-03-02 NOTE — Telephone Encounter (Signed)
Please call [name]-patient's daughter when order has been called in. Her number is [phone].  Thank you

## 2015-03-03 DIAGNOSIS — E1129 Type 2 diabetes mellitus with other diabetic kidney complication: Secondary | ICD-10-CM | POA: Insufficient documentation

## 2015-03-03 MED ORDER — GLUCOSE BLOOD VI STRP: ORAL_STRIP | Status: AC

## 2015-03-03 NOTE — Telephone Encounter (Signed)
Test strips called into pharmacy

## 2015-03-04 ENCOUNTER — Ambulatory Visit: Payer: Medicare PPO | Admitting: Podiatry

## 2015-03-10 ENCOUNTER — Telehealth: Payer: Self-pay | Admitting: Family Medicine

## 2015-03-10 NOTE — Telephone Encounter (Signed)
Patient's daughter called and request to have warfarin called into [facility] sot hey can map it correctly in bubble wrap she takes 3mg  everyday except 6 on Thurs. If you have any question please call daughter [phone]. Thanks

## 2015-03-11 MED ORDER — WARFARIN SODIUM 3 MG PO TABS: 3.0000 mg | ORAL_TABLET | Freq: Every day | ORAL | Status: DC

## 2015-03-11 NOTE — Telephone Encounter (Signed)
Called number below. No answer. Will call back later.

## 2015-03-11 NOTE — Telephone Encounter (Signed)
Warfarin sent into pharmacy.

## 2015-03-12 NOTE — Telephone Encounter (Signed)
Left detailed VM at  [phone] notifying pts daughter.

## 2015-03-17 ENCOUNTER — Ambulatory Visit (INDEPENDENT_AMBULATORY_CARE_PROVIDER_SITE_OTHER): Payer: Medicare PPO | Admitting: Family Medicine

## 2015-03-17 VITALS — BP 134/81 | HR 113

## 2015-03-17 DIAGNOSIS — I482 Chronic atrial fibrillation, unspecified: Secondary | ICD-10-CM

## 2015-03-17 LAB — POCT INR: INR: 2

## 2015-03-17 NOTE — Progress Notes (Signed)
Would you like to recheck in 2 weeks or 4 weeks?

## 2015-03-17 NOTE — Progress Notes (Signed)
See LPN note regarding INR check.   Lab Results  Component Value Date   INR 2.0 03/17/2015   INR 2.3 02/13/2015   INR 1.31 05/31/2013    Continue current warfarin schedule.  Return as directed.

## 2015-03-18 ENCOUNTER — Ambulatory Visit: Payer: Medicare PPO | Admitting: Podiatry

## 2015-03-18 NOTE — Progress Notes (Signed)
Per PCP, Pt to return in 2 weeks. Daughter advised of results and recommendations regarding INR and Coumadin dosage, verbalized understanding. Follow up appt made.

## 2015-03-26 ENCOUNTER — Ambulatory Visit: Payer: Medicare PPO | Admitting: Podiatry

## 2015-04-01 ENCOUNTER — Ambulatory Visit: Payer: Medicare PPO | Admitting: Podiatry

## 2015-04-01 ENCOUNTER — Encounter: Payer: Self-pay | Admitting: Family Medicine

## 2015-04-01 ENCOUNTER — Ambulatory Visit (INDEPENDENT_AMBULATORY_CARE_PROVIDER_SITE_OTHER): Payer: Medicare PPO | Admitting: Family Medicine

## 2015-04-01 VITALS — BP 127/50 | HR 61 | Temp 98.4°F | Wt 164.0 lb

## 2015-04-01 DIAGNOSIS — E039 Hypothyroidism, unspecified: Secondary | ICD-10-CM

## 2015-04-01 DIAGNOSIS — E1122 Type 2 diabetes mellitus with diabetic chronic kidney disease: Secondary | ICD-10-CM

## 2015-04-01 DIAGNOSIS — I482 Chronic atrial fibrillation, unspecified: Secondary | ICD-10-CM

## 2015-04-01 DIAGNOSIS — N3001 Acute cystitis with hematuria: Secondary | ICD-10-CM | POA: Diagnosis not present

## 2015-04-01 DIAGNOSIS — Z7901 Long term (current) use of anticoagulants: Secondary | ICD-10-CM

## 2015-04-01 DIAGNOSIS — N390 Urinary tract infection, site not specified: Secondary | ICD-10-CM | POA: Insufficient documentation

## 2015-04-01 DIAGNOSIS — M069 Rheumatoid arthritis, unspecified: Secondary | ICD-10-CM

## 2015-04-01 DIAGNOSIS — N183 Chronic kidney disease, stage 3 (moderate): Secondary | ICD-10-CM

## 2015-04-01 LAB — POCT URINALYSIS DIPSTICK
BILIRUBIN UA: NEGATIVE
Glucose, UA: NEGATIVE
Ketones, UA: NEGATIVE
NITRITE UA: POSITIVE
PH UA: 6
PROTEIN UA: NEGATIVE
Spec Grav, UA: 1.015
UROBILINOGEN UA: 0.2

## 2015-04-01 LAB — POCT INR: INR: 3

## 2015-04-01 MED ORDER — ZOSTER VACCINE LIVE 19400 UNT/0.65ML ~~LOC~~ SOLR: 0.6500 mL | Freq: Once | SUBCUTANEOUS | Status: DC

## 2015-04-01 MED ORDER — CIPROFLOXACIN HCL 250 MG PO TABS: 250.0000 mg | ORAL_TABLET | Freq: Two times a day (BID) | ORAL | Status: DC

## 2015-04-01 NOTE — Progress Notes (Signed)
[name] is a 81 y.o. female who presents to [name] today for   1)  INR patient continues current regimen of warfarin. She feels well. No blood in the stool or urine visible.  2) urinary frequency urgency dysuria present for the last few days. Symptoms consistent with prior UTI. No fevers chills nausea vomiting or diarrhea currently. Patient was last week had a few days of vomiting and diarrhea that she attributes to a viral gastroenteritis which has since resolved.  3) hypothyroidism: Currently maintaining current Synthroid dose. Patient feels well. She is not feeling too hot or too cold.   Past Medical History  Diagnosis Date  . Diabetes mellitus age 32  . Hypertension   . Arthritis   . Joint pain   . Atrial fibrillation (HCC)   . CHF (congestive heart failure) (HCC)   . Mesenteric ischemia   . Acute pancreatitis   . GI bleed   . Peripheral vascular disease (HCC)   . GERD (gastroesophageal reflux disease)   . Thyroid disease   . DJD (degenerative joint disease)   . CAD (coronary artery disease)   . DVT (deep venous thrombosis) (HCC)   . Anemia   . Carotid artery occlusion    Past Surgical History  Procedure Laterality Date  . Embolectomy  2009    right leg  . Celiac artery stent    . Fasciotomy      right leg  . Coronary artery bypass graft    . Total knee arthroplasty      bilateral  . Appendectomy    . Tubal ligation    . Joint replacement    . Visceral angiogram N/A 05/31/2013    Procedure: MESSENTERIC [name];  Surgeon: [name], MD;  Location: [facility] CATH LAB;  Service: Cardiovascular;  Laterality: N/A;   Social History  Substance Use Topics  . Smoking status: Never Smoker   . Smokeless tobacco: Never Used  . Alcohol Use: No   family history includes Cancer in her daughter and mother; Coronary artery disease in her sister; Diabetes in  her daughter, sister, and son; Heart disease in her sister; Hyperlipidemia in her son; Hypertension in her daughter and son; Other in her father and sister.  ROS as above Medications: Current Outpatient Prescriptions  Medication Sig Dispense Refill  . alendronate (FOSAMAX) 70 MG tablet Take 70 mg by mouth once a week. Take with a full glass of water on an empty stomach.    Marland Kitchen aspirin EC 81 MG tablet Take 1 tablet (81 mg total) by mouth daily. 90 tablet 3  . calcium carbonate (OS-CAL - DOSED IN MG OF ELEMENTAL CALCIUM) 1250 MG tablet Take 1 tablet by mouth daily with breakfast.    . cholecalciferol (VITAMIN D) 1000 UNITS tablet Take 2,000 Units by mouth daily.    . clonazePAM (KLONOPIN) 0.5 MG tablet Take 0.5 mg by mouth daily.      Marland Kitchen diltiazem (CARDIZEM CD) 360 MG 24 hr capsule Take 360 mg by mouth daily.      Marland Kitchen docusate sodium (COLACE) 100 MG capsule Take 100 mg by mouth 2 (two) times daily.    Marland Kitchen glucose blood test strip Once daily E11.29 100 each 12  . iron polysaccharides (NIFEREX) 150 MG capsule Take 150 mg by mouth daily.    Marland Kitchen leflunomide (ARAVA) 10 MG tablet Take 10 mg by mouth daily.    Marland Kitchen levothyroxine (SYNTHROID, LEVOTHROID) 50  MCG tablet Take 50 mcg by mouth daily.      Marland Kitchen lisinopril (PRINIVIL,ZESTRIL) 20 MG tablet Take 20 mg by mouth daily.      . Multiple Vitamin (MULTIVITAMIN WITH MINERALS) TABS tablet Take 1 tablet by mouth daily.    . pantoprazole (PROTONIX) 40 MG tablet Take 40 mg by mouth daily.      . pravastatin (PRAVACHOL) 40 MG tablet Take 40 mg by mouth daily.      Marland Kitchen torsemide (DEMADEX) 100 MG tablet Take 50 mg by mouth daily.    . traMADol (ULTRAM) 50 MG tablet Take 100 mg by mouth 2 (two) times daily.     . vitamin B-12 (CYANOCOBALAMIN) 1000 MCG tablet Take 1,000 mcg by mouth daily. 1 tab 2x weekly    . warfarin (COUMADIN) 3 MG tablet Take 1 tablet (3 mg total) by mouth daily. Take 1 tablet on every day but thursday.  Take 2 tablets on Thursday. Bubble container 36  tablet 2  . ciprofloxacin (CIPRO) 250 MG tablet Take 1 tablet (250 mg total) by mouth 2 (two) times daily. 14 tablet 0   No current facility-administered medications for this visit.   Allergies  Allergen Reactions  . Cefuroxime Anaphylaxis    Throat swelling  . Cephalexin Rash     Exam:  BP 127/50 mmHg  Pulse 61  Temp(Src) 98.4 F (36.9 C) (Oral)  Wt 164 lb (74.39 kg)  SpO2 97% Gen: Well NAD HEENT: EOMI,  MMM Lungs: Normal work of breathing. CTABL Heart: Regular rate MRG Abd: NABS, Soft. Nondistended, Nontender Exts: Brisk capillary refill, warm and well perfused.   Results for orders placed or performed in visit on 04/01/15 (from the past 24 hour(s))  POCT INR     Status: None   Collection Time: 04/01/15  1:51 PM  Result Value Ref Range   INR 3.0   POCT Urinalysis Dipstick     Status: Abnormal   Collection Time: 04/01/15  2:10 PM  Result Value Ref Range   Color, UA yellow    Clarity, UA clear    Glucose, UA neg    Bilirubin, UA neg    Ketones, UA neg    Spec Grav, UA 1.015    Blood, UA trace intact    pH, UA 6.0    Protein, UA neg    Urobilinogen, UA 0.2    Nitrite, UA pos    Leukocytes, UA large (3+) (A) Negative   No results found.   Please see individual assessment and plan sections.

## 2015-04-01 NOTE — Assessment & Plan Note (Signed)
Check TSH 

## 2015-04-01 NOTE — Assessment & Plan Note (Signed)
Check A1c. 

## 2015-04-01 NOTE — Patient Instructions (Signed)
Thank you for coming in today. Get labs today.  Return in 2 weeks for a INR check and with me in 1 month.  If your belly pain worsens, or you have high fever, bad vomiting, blood in your stool or black tarry stool go to the Emergency Room.  Take cipro twice daily for 1 week.   We will send a record request to your doctor again.

## 2015-04-01 NOTE — Assessment & Plan Note (Signed)
UTI present. Culture pending. Treat with Cipro. Vision is allergic to cephalosporins and has chronic kidney disease interfering with nitrofurantoin. Bactrim likely will interfere more severely with warfarin. Will use Cipro instead. Reduced dose to 250 twice daily due to CKD stage III. Return in 2 weeks to recheck warfarin

## 2015-04-01 NOTE — Assessment & Plan Note (Signed)
Continue warfarin regimen. Recheck INR in 2 weeks due to Cipro.

## 2015-04-02 ENCOUNTER — Ambulatory Visit (INDEPENDENT_AMBULATORY_CARE_PROVIDER_SITE_OTHER): Payer: Medicare PPO | Admitting: Podiatry

## 2015-04-02 ENCOUNTER — Encounter: Payer: Self-pay | Admitting: Podiatry

## 2015-04-02 DIAGNOSIS — E1140 Type 2 diabetes mellitus with diabetic neuropathy, unspecified: Secondary | ICD-10-CM | POA: Diagnosis not present

## 2015-04-02 DIAGNOSIS — Q828 Other specified congenital malformations of skin: Secondary | ICD-10-CM

## 2015-04-02 LAB — HEMOGLOBIN A1C
HEMOGLOBIN A1C: 6.8 % — AB (ref <5.7)
MEAN PLASMA GLUCOSE: 148 mg/dL — AB (ref <117)

## 2015-04-02 LAB — COMPREHENSIVE METABOLIC PANEL
ALBUMIN: 3.3 g/dL — AB (ref 3.6–5.1)
ALK PHOS: 63 U/L (ref 33–130)
ALT: 13 U/L (ref 6–29)
AST: 18 U/L (ref 10–35)
BILIRUBIN TOTAL: 0.4 mg/dL (ref 0.2–1.2)
BUN: 28 mg/dL — ABNORMAL HIGH (ref 7–25)
CALCIUM: 8.4 mg/dL — AB (ref 8.6–10.4)
CO2: 21 mmol/L (ref 20–31)
CREATININE: 1.49 mg/dL — AB (ref 0.60–0.88)
Chloride: 101 mmol/L (ref 98–110)
GLUCOSE: 156 mg/dL — AB (ref 65–99)
Potassium: 4.5 mmol/L (ref 3.5–5.3)
Sodium: 137 mmol/L (ref 135–146)
Total Protein: 6.3 g/dL (ref 6.1–8.1)

## 2015-04-02 LAB — CBC
HEMATOCRIT: 33 % — AB (ref 36.0–46.0)
HEMOGLOBIN: 10.7 g/dL — AB (ref 12.0–15.0)
MCH: 27.1 pg (ref 26.0–34.0)
MCHC: 32.4 g/dL (ref 30.0–36.0)
MCV: 83.5 fL (ref 78.0–100.0)
MPV: 9.8 fL (ref 8.6–12.4)
Platelets: 341 10*3/uL (ref 150–400)
RBC: 3.95 MIL/uL (ref 3.87–5.11)
RDW: 17.9 % — ABNORMAL HIGH (ref 11.5–15.5)
WBC: 7.9 10*3/uL (ref 4.0–10.5)

## 2015-04-02 LAB — TSH: TSH: 1.324 u[IU]/mL (ref 0.350–4.500)

## 2015-04-02 NOTE — Progress Notes (Signed)
Quick Note:    Labs are normal.  ______

## 2015-04-02 NOTE — Progress Notes (Signed)
Subjective:     Patient ID: [name], female   DOB: 1929/06/21, 81 y.o.   MRN: [id number]  HPIThis patient presents to the office for preventive foot care services.  She has thick painful nails as well as porokeratosis both feet.  She has RA with rhumatoid nodules forefeet B/L She presents for preventive foot care services. Patient is a diabetic.   Review of Systems     Objective:   Physical Exam GENERAL APPEARANCE: Alert, conversant. Appropriately groomed. No acute distress.  VASCULAR: Pedal pulses palpable at 2/4 DP and PT bilateral.  Capillary refill time is immediate to all digits,  Proximal to distal cooling it warm to warm.  Digital hair growth is present bilateral  NEUROLOGIC: sensation is intact epicritically and protectively to 5.07 monofilament at 5/5 sites bilateral.  Light touch is intact bilateral, vibratory sensation intact bilateral, achilles tendon reflex is intact bilateral.  MUSCULOSKELETAL: contracted digits both feet.  Heloma durum 4th left foot and HD 5th right foot..  Prominent metatarsals/ nodules  both feet due to RA. DERMATOLOGIC: Porokeratosis under rhumatoid nodules both forefeet. No sings of infections or ulcers.  No drainage noted. NAILS  Thick disfigured discolored nails both feet.     Assessment:     Onychomycosis  B/L  Porokewratosis B/L     Plan:     Debridement of nails.  Debridement of porokeratosis.   RTC 4 weeks.     Helane Gunther DPM

## 2015-04-03 LAB — URINE CULTURE: Colony Count: >=100000

## 2015-04-07 NOTE — Progress Notes (Signed)
Quick Note:  Bacteria is sensitive to cipro. Return for warfarin check ______

## 2015-04-14 DIAGNOSIS — N183 Chronic kidney disease, stage 3 unspecified: Secondary | ICD-10-CM | POA: Insufficient documentation

## 2015-04-14 DIAGNOSIS — K5900 Constipation, unspecified: Secondary | ICD-10-CM | POA: Insufficient documentation

## 2015-04-14 DIAGNOSIS — S72009A Fracture of unspecified part of neck of unspecified femur, initial encounter for closed fracture: Secondary | ICD-10-CM | POA: Insufficient documentation

## 2015-04-16 ENCOUNTER — Ambulatory Visit: Payer: Medicare PPO

## 2015-04-22 ENCOUNTER — Ambulatory Visit: Payer: Medicare PPO | Admitting: Podiatry

## 2015-04-29 ENCOUNTER — Ambulatory Visit: Payer: Medicare PPO | Admitting: Podiatry

## 2015-05-04 ENCOUNTER — Ambulatory Visit (INDEPENDENT_AMBULATORY_CARE_PROVIDER_SITE_OTHER): Payer: Medicare PPO | Admitting: Family Medicine

## 2015-05-04 DIAGNOSIS — E1122 Type 2 diabetes mellitus with diabetic chronic kidney disease: Secondary | ICD-10-CM

## 2015-05-04 DIAGNOSIS — N183 Chronic kidney disease, stage 3 (moderate): Secondary | ICD-10-CM

## 2015-05-04 NOTE — Progress Notes (Signed)
No show x 2.

## 2015-05-05 ENCOUNTER — Encounter: Payer: Self-pay | Admitting: Family Medicine

## 2015-05-19 ENCOUNTER — Encounter: Payer: Self-pay | Admitting: Family Medicine

## 2015-05-19 ENCOUNTER — Ambulatory Visit (INDEPENDENT_AMBULATORY_CARE_PROVIDER_SITE_OTHER): Payer: Medicare PPO | Admitting: Family Medicine

## 2015-05-19 VITALS — BP 125/91 | HR 89 | Wt 163.0 lb

## 2015-05-19 DIAGNOSIS — R300 Dysuria: Secondary | ICD-10-CM

## 2015-05-19 DIAGNOSIS — S72009A Fracture of unspecified part of neck of unspecified femur, initial encounter for closed fracture: Secondary | ICD-10-CM

## 2015-05-19 DIAGNOSIS — I482 Chronic atrial fibrillation, unspecified: Secondary | ICD-10-CM

## 2015-05-19 DIAGNOSIS — N3001 Acute cystitis with hematuria: Secondary | ICD-10-CM

## 2015-05-19 DIAGNOSIS — Z7901 Long term (current) use of anticoagulants: Secondary | ICD-10-CM | POA: Diagnosis not present

## 2015-05-19 DIAGNOSIS — N184 Chronic kidney disease, stage 4 (severe): Secondary | ICD-10-CM

## 2015-05-19 LAB — POCT URINALYSIS DIPSTICK
BILIRUBIN UA: NEGATIVE
GLUCOSE UA: NEGATIVE
Ketones, UA: NEGATIVE
NITRITE UA: NEGATIVE
PH UA: 6.5
Protein, UA: NEGATIVE
Spec Grav, UA: 1.01
Urobilinogen, UA: 0.2

## 2015-05-19 LAB — POCT INR: INR: 2.7

## 2015-05-19 MED ORDER — CIPROFLOXACIN HCL 250 MG PO TABS: 250.0000 mg | ORAL_TABLET | Freq: Two times a day (BID) | ORAL | Status: DC

## 2015-05-19 NOTE — Assessment & Plan Note (Signed)
Continue current dose of warfarin

## 2015-05-19 NOTE — Patient Instructions (Addendum)
Thank you for coming in today.\ If your belly pain worsens, or you have high fever, bad vomiting, blood in your stool or black tarry stool go to the Emergency Room.  Return in 1 month.  Take cipro daily.  Get labs today.   Urinary Tract Infection Urinary tract infections (UTIs) can develop anywhere along your urinary tract. Your urinary tract is your body's drainage system for removing wastes and extra water. Your urinary tract includes two kidneys, two ureters, a bladder, and a urethra. Your kidneys are a pair of bean-shaped organs. Each kidney is about the size of your fist. They are located below your ribs, one on each side of your spine. CAUSES Infections are caused by microbes, which are microscopic organisms, including fungi, viruses, and bacteria. These organisms are so small that they can only be seen through a microscope. Bacteria are the microbes that most commonly cause UTIs. SYMPTOMS  Symptoms of UTIs may vary by age and gender of the patient and by the location of the infection. Symptoms in young women typically include a frequent and intense urge to urinate and a painful, burning feeling in the bladder or urethra during urination. Older women and men are more likely to be tired, shaky, and weak and have muscle aches and abdominal pain. A fever may mean the infection is in your kidneys. Other symptoms of a kidney infection include pain in your back or sides below the ribs, nausea, and vomiting. DIAGNOSIS To diagnose a UTI, your caregiver will ask you about your symptoms. Your caregiver will also ask you to provide a urine sample. The urine sample will be tested for bacteria and white blood cells. White blood cells are made by your body to help fight infection. TREATMENT  Typically, UTIs can be treated with medication. Because most UTIs are caused by a bacterial infection, they usually can be treated with the use of antibiotics. The choice of antibiotic and length of treatment depend on  your symptoms and the type of bacteria causing your infection. HOME CARE INSTRUCTIONS  If you were prescribed antibiotics, take them exactly as your caregiver instructs you. Finish the medication even if you feel better after you have only taken some of the medication.  Drink enough water and fluids to keep your urine clear or pale yellow.  Avoid caffeine, tea, and carbonated beverages. They tend to irritate your bladder.  Empty your bladder often. Avoid holding urine for long periods of time.  Empty your bladder before and after sexual intercourse.  After a bowel movement, women should cleanse from front to back. Use each tissue only once. SEEK MEDICAL CARE IF:   You have back pain.  You develop a fever.  Your symptoms do not begin to resolve within 3 days. SEEK IMMEDIATE MEDICAL CARE IF:   You have severe back pain or lower abdominal pain.  You develop chills.  You have nausea or vomiting.  You have continued burning or discomfort with urination. MAKE SURE YOU:   Understand these instructions.  Will watch your condition.  Will get help right away if you are not doing well or get worse.   This information is not intended to replace advice given to you by your health care provider. Make sure you discuss any questions you have with your health care provider.   Document Released: 01/05/2005 Document Revised: 12/17/2014 Document Reviewed: 05/06/2011 Elsevier Interactive Patient Education [employer]

## 2015-05-19 NOTE — Assessment & Plan Note (Signed)
Treatment with Cipro. Culture pending.

## 2015-05-19 NOTE — Assessment & Plan Note (Signed)
Check CMP.  ?

## 2015-05-19 NOTE — Progress Notes (Signed)
[name] is a 82 y.o. female who presents to [facility] [name]: Primary Care today for follow-up recent hospitalization. Patient fell in early January fracturing her hip. She was discharged from the hospital to a rehabilitation facility. She is back home at her retirement community in good health. She denies any severe pain. She is weaning off of the hydrocodone and onto tramadol for pain resulting from her hip fracture. She denies any fevers chills nausea vomiting or diarrhea. The only acute issue today is urinary frequency urgency and dysuria. Her symptoms are consistent with urinary tract infection. This is been ongoing now for 3 days. She has not tried any medicines for them.   Past Medical History  Diagnosis Date  . Diabetes mellitus age 68  . Hypertension   . Arthritis   . Joint pain   . Atrial fibrillation (HCC)   . CHF (congestive heart failure) (HCC)   . Mesenteric ischemia   . Acute pancreatitis   . GI bleed   . Peripheral vascular disease (HCC)   . GERD (gastroesophageal reflux disease)   . Thyroid disease   . DJD (degenerative joint disease)   . CAD (coronary artery disease)   . DVT (deep venous thrombosis) (HCC)   . Anemia   . Carotid artery occlusion    Past Surgical History  Procedure Laterality Date  . Embolectomy  2009    right leg  . Celiac artery stent    . Fasciotomy      right leg  . Coronary artery bypass graft    . Total knee arthroplasty      bilateral  . Appendectomy    . Tubal ligation    . Joint replacement    . Visceral angiogram N/A 05/31/2013    Procedure: MESSENTERIC [name];  Surgeon: [name], MD;  Location: [facility] CATH LAB;  Service: Cardiovascular;  Laterality: N/A;   Social History  Substance Use Topics  . Smoking status: Never Smoker   . Smokeless tobacco: Never Used  . Alcohol Use: No   family history includes Cancer in her daughter  and mother; Coronary artery disease in her sister; Diabetes in her daughter, sister, and son; Heart disease in her sister; Hyperlipidemia in her son; Hypertension in her daughter and son; Other in her father and sister.  ROS as above Medications: Current Outpatient Prescriptions  Medication Sig Dispense Refill  . alendronate (FOSAMAX) 70 MG tablet Take 70 mg by mouth once a week. Take with a full glass of water on an empty stomach.    Marland Kitchen alendronate (FOSAMAX) 70 MG tablet Take by mouth.    Marland Kitchen aspirin 81 MG chewable tablet Chew by mouth.    Marland Kitchen aspirin EC 81 MG tablet Take 1 tablet (81 mg total) by mouth daily. 90 tablet 3  . bisacodyl (DULCOLAX) 5 MG EC tablet Take by mouth.    . Calcium Carb-Cholecalciferol (CALCIUM 600+D) 600-800 MG-UNIT TABS Take by mouth.    . calcium carbonate (OS-CAL - DOSED IN MG OF ELEMENTAL CALCIUM) 1250 MG tablet Take 1 tablet by mouth daily with breakfast.    . cholecalciferol (VITAMIN D) 1000 UNITS tablet Take 2,000 Units by mouth daily.    . ciprofloxacin (CIPRO) 250 MG tablet Take 1 tablet (250 mg total) by mouth 2 (two) times daily. 14 tablet 0  . clonazePAM (KLONOPIN) 0.5 MG tablet Take 0.5 mg by mouth daily.      Marland Kitchen diltiazem (CARDIZEM CD) 360 MG  24 hr capsule Take 360 mg by mouth daily.      Marland Kitchen docusate sodium (COLACE) 100 MG capsule Take 100 mg by mouth 2 (two) times daily.    . Ergocalciferol (VITAMIN D2) 2000 units TABS Take by mouth.    Marland Kitchen glucose blood test strip Once daily E11.29 100 each 12  . iron polysaccharides (NIFEREX) 150 MG capsule Take 150 mg by mouth daily.    Marland Kitchen leflunomide (ARAVA) 10 MG tablet Take 10 mg by mouth daily.    Marland Kitchen levothyroxine (SYNTHROID, LEVOTHROID) 50 MCG tablet Take 50 mcg by mouth daily.      Marland Kitchen lisinopril (PRINIVIL,ZESTRIL) 20 MG tablet Take 20 mg by mouth daily.      . metoprolol succinate (TOPROL-XL) 50 MG 24 hr tablet Take by mouth.    . Multiple Vitamin (MULTIVITAMIN WITH MINERALS) TABS tablet Take 1 tablet by mouth daily.      . pantoprazole (PROTONIX) 40 MG tablet Take 40 mg by mouth daily.      . pravastatin (PRAVACHOL) 40 MG tablet Take 40 mg by mouth daily.      Marland Kitchen torsemide (DEMADEX) 100 MG tablet Take 50 mg by mouth daily.    . traMADol (ULTRAM) 50 MG tablet Take 100 mg by mouth 2 (two) times daily.     . vitamin B-12 (CYANOCOBALAMIN) 1000 MCG tablet Take 1,000 mcg by mouth daily. 1 tab 2x weekly    . warfarin (COUMADIN) 3 MG tablet Take 1 tablet (3 mg total) by mouth daily. Take 1 tablet on every day but thursday.  Take 2 tablets on Thursday. Bubble container 36 tablet 2  . zoster vaccine live, PF, (ZOSTAVAX) 48185 UNT/0.65ML injection Inject 19,400 Units into the skin once. 1 each 0   No current facility-administered medications for this visit.   Allergies  Allergen Reactions  . Cefuroxime Anaphylaxis    Throat swelling  . Cephalexin Rash     Exam:  BP 125/91 mmHg  Pulse 89  Wt 163 lb (73.936 kg) Gen: Well NAD HEENT: EOMI,  MMM Lungs: Normal work of breathing. CTABL Heart: Irregular rhythm no MRG Abd: NABS, Soft. Nondistended, Nontender Exts: Brisk capillary refill, warm and well perfused.   Results for orders placed or performed in visit on 05/19/15 (from the past 24 hour(s))  POCT INR     Status: None   Collection Time: 05/19/15  1:42 PM  Result Value Ref Range   INR 2.7   POCT Urinalysis Dipstick     Status: Abnormal   Collection Time: 05/19/15  1:50 PM  Result Value Ref Range   Color, UA YELLOW    Clarity, UA CLOUDY    Glucose, UA NEG    Bilirubin, UA NEG    Ketones, UA NEG    Spec Grav, UA 1.010    Blood, UA SMALL    pH, UA 6.5    Protein, UA NEG    Urobilinogen, UA 0.2    Nitrite, UA NEG    Leukocytes, UA moderate (2+) (A) Negative   No results found.   Please see individual assessment and plan sections.

## 2015-05-19 NOTE — Assessment & Plan Note (Signed)
Follow up with orthopedics

## 2015-05-20 ENCOUNTER — Telehealth: Payer: Self-pay

## 2015-05-20 DIAGNOSIS — I482 Chronic atrial fibrillation, unspecified: Secondary | ICD-10-CM

## 2015-05-20 LAB — COMPREHENSIVE METABOLIC PANEL
ALK PHOS: 92 U/L (ref 33–130)
ALT: 13 U/L (ref 6–29)
AST: 21 U/L (ref 10–35)
Albumin: 3.5 g/dL — ABNORMAL LOW (ref 3.6–5.1)
BILIRUBIN TOTAL: 0.5 mg/dL (ref 0.2–1.2)
BUN: 30 mg/dL — AB (ref 7–25)
CO2: 27 mmol/L (ref 20–31)
CREATININE: 1.58 mg/dL — AB (ref 0.60–0.88)
Calcium: 8.9 mg/dL (ref 8.6–10.4)
Chloride: 102 mmol/L (ref 98–110)
GLUCOSE: 127 mg/dL — AB (ref 65–99)
Potassium: 4.9 mmol/L (ref 3.5–5.3)
SODIUM: 138 mmol/L (ref 135–146)
Total Protein: 6.8 g/dL (ref 6.1–8.1)

## 2015-05-20 LAB — CBC
HEMATOCRIT: 33.6 % — AB (ref 36.0–46.0)
Hemoglobin: 10.4 g/dL — ABNORMAL LOW (ref 12.0–15.0)
MCH: 26.5 pg (ref 26.0–34.0)
MCHC: 31 g/dL (ref 30.0–36.0)
MCV: 85.5 fL (ref 78.0–100.0)
MPV: 9.9 fL (ref 8.6–12.4)
Platelets: 361 10*3/uL (ref 150–400)
RBC: 3.93 MIL/uL (ref 3.87–5.11)
RDW: 17.8 % — AB (ref 11.5–15.5)
WBC: 9.4 10*3/uL (ref 4.0–10.5)

## 2015-05-20 LAB — FERRITIN: Ferritin: 56 ng/mL (ref 20–288)

## 2015-05-20 NOTE — Telephone Encounter (Signed)
Pt's daughter [name] called stating that the pt was given a rx for metoprolol succinate (TOPROL-XL) 50 MG 24 hr tablet at the hospital due to her heart rate being elevated. The following day at the pts nursing heart rate had dropped to the 40 range so her daughter decided to give her 25mg  going forward. She would like a prescription sent to [facility] pharmacy for metoprolol succinate 25 MG. Pt also would like to know if you would recommend her mother see a cardiologist. Please advise.   Pts daughterTanya also advised that pt was in [facility] nursing facility in [place]. I will contact them tomorrow for records.

## 2015-05-20 NOTE — Progress Notes (Signed)
Quick Note:    Labs are stable.     ______

## 2015-05-22 LAB — URINE CULTURE

## 2015-05-22 MED ORDER — METOPROLOL SUCCINATE ER 25 MG PO TB24: 25.0000 mg | ORAL_TABLET | Freq: Every day | ORAL | Status: DC

## 2015-05-22 NOTE — Telephone Encounter (Signed)
Refer to cardiology.  Medicine sent.

## 2015-05-22 NOTE — Progress Notes (Signed)
Quick Note:  UTI bacteria is sensitive to the antibiotic we picked. ______

## 2015-05-22 NOTE — Telephone Encounter (Signed)
Left detailed vm at [phone]

## 2015-06-01 ENCOUNTER — Other Ambulatory Visit: Payer: Self-pay | Admitting: Family Medicine

## 2015-06-15 ENCOUNTER — Encounter: Payer: Self-pay | Admitting: Family

## 2015-06-16 ENCOUNTER — Ambulatory Visit (INDEPENDENT_AMBULATORY_CARE_PROVIDER_SITE_OTHER): Payer: Medicare PPO | Admitting: Family Medicine

## 2015-06-16 ENCOUNTER — Encounter: Payer: Self-pay | Admitting: Family Medicine

## 2015-06-16 DIAGNOSIS — I482 Chronic atrial fibrillation: Secondary | ICD-10-CM

## 2015-06-16 NOTE — Progress Notes (Signed)
No show x 3. Letter for dismissal from practice written but not sent. Will contact [facility] to see if they can correct transportation issue.  1 more chance.

## 2015-06-17 ENCOUNTER — Ambulatory Visit (INDEPENDENT_AMBULATORY_CARE_PROVIDER_SITE_OTHER): Payer: Medicare PPO | Admitting: Family Medicine

## 2015-06-17 ENCOUNTER — Encounter: Payer: Self-pay | Admitting: Family Medicine

## 2015-06-17 VITALS — BP 118/58 | HR 59 | Wt 157.0 lb

## 2015-06-17 DIAGNOSIS — I482 Chronic atrial fibrillation, unspecified: Secondary | ICD-10-CM

## 2015-06-17 DIAGNOSIS — E1122 Type 2 diabetes mellitus with diabetic chronic kidney disease: Secondary | ICD-10-CM | POA: Diagnosis not present

## 2015-06-17 DIAGNOSIS — M79605 Pain in left leg: Secondary | ICD-10-CM

## 2015-06-17 DIAGNOSIS — Z23 Encounter for immunization: Secondary | ICD-10-CM | POA: Diagnosis not present

## 2015-06-17 DIAGNOSIS — N183 Chronic kidney disease, stage 3 (moderate): Secondary | ICD-10-CM

## 2015-06-17 MED ORDER — ZOSTER VACCINE LIVE 19400 UNT/0.65ML ~~LOC~~ SOLR: 0.6500 mL | Freq: Once | SUBCUTANEOUS | Status: DC

## 2015-06-17 NOTE — Patient Instructions (Signed)
Thank you for coming in today. You should get the shingles shot from the pharmacy.  Return in 1 month.  Let me know which home health PT group comes to your location and I will order PT again to help your hip pain.  Ask her to do Iontophoresis for greater trochanteric bursitis.

## 2015-06-17 NOTE — Progress Notes (Signed)
[name] is a 82 y.o. female who presents to [facility] [name]: Primary Care today for follow-up left lateral hip pain/fracture, atrial fibrillation.   1) patient suffered a left hip fracture about 2 months ago. She had a short stay in a skilled nursing facility and is back home in her current assisted living facility. She is doing quite well and notes she has regained her full mobility with a walker. She does note some continued left lateral hip pain especially when she lies on her left side. She did receive some home health physical therapy which has since stopped.  2) atrial fibrillation/anticoagulation. Patient is doing well with no palpitations chest pain shortness of breath. She denies any blood in the stool. She feels well  3) UTI: Patient has a history of chronic recurrent UTIs. Last month she was diagnosed with UTI treated empirically with Cipro. Culture grew Klebsiella sensitive to Cipro. She feels currently asymptomatic from a GI standpoint.  4) health maintenance: Patient has an upcoming ophthalmology appointment for her diabetic eye care. She thinks she is due for Prevnar. She thinks she had a tetanus vaccine about a year ago. She has never had varicella vaccine.   Past Medical History  Diagnosis Date  . Diabetes mellitus age 45  . Hypertension   . Arthritis   . Joint pain   . Atrial fibrillation (HCC)   . CHF (congestive heart failure) (HCC)   . Mesenteric ischemia   . Acute pancreatitis   . GI bleed   . Peripheral vascular disease (HCC)   . GERD (gastroesophageal reflux disease)   . Thyroid disease   . DJD (degenerative joint disease)   . CAD (coronary artery disease)   . DVT (deep venous thrombosis) (HCC)   . Anemia   . Carotid artery occlusion    Past Surgical History  Procedure Laterality Date  . Embolectomy  2009    right leg  . Celiac artery stent    . Fasciotomy       right leg  . Coronary artery bypass graft    . Total knee arthroplasty      bilateral  . Appendectomy    . Tubal ligation    . Joint replacement    . Visceral angiogram N/A 05/31/2013    Procedure: MESSENTERIC [name];  Surgeon: [name], MD;  Location: [facility] CATH LAB;  Service: Cardiovascular;  Laterality: N/A;   Social History  Substance Use Topics  . Smoking status: Never Smoker   . Smokeless tobacco: Never Used  . Alcohol Use: No   family history includes Cancer in her daughter and mother; Coronary artery disease in her sister; Diabetes in her daughter, sister, and son; Heart disease in her sister; Hyperlipidemia in her son; Hypertension in her daughter and son; Other in her father and sister.  ROS as above Medications: Current Outpatient Prescriptions  Medication Sig Dispense Refill  . alendronate (FOSAMAX) 70 MG tablet Take by mouth.    Marland Kitchen aspirin 81 MG chewable tablet Chew by mouth.    Marland Kitchen aspirin EC 81 MG tablet Take 1 tablet (81 mg total) by mouth daily. 90 tablet 3  . Calcium Carb-Cholecalciferol (CALCIUM 600+D) 600-800 MG-UNIT TABS Take by mouth.    . calcium carbonate (OS-CAL - DOSED IN MG OF ELEMENTAL CALCIUM) 1250 MG tablet Take 1 tablet by mouth daily with breakfast.    . cholecalciferol (VITAMIN D) 1000 UNITS tablet Take 2,000 Units by mouth daily.    Marland Kitchen  clonazePAM (KLONOPIN) 0.5 MG tablet Take 0.5 mg by mouth daily.      Marland Kitchen diltiazem (CARDIZEM CD) 360 MG 24 hr capsule Take 360 mg by mouth daily.      Marland Kitchen docusate sodium (COLACE) 100 MG capsule Take 100 mg by mouth 2 (two) times daily.    . Ergocalciferol (VITAMIN D2) 2000 units TABS Take by mouth.    Marland Kitchen glucose blood test strip Once daily E11.29 100 each 12  . iron polysaccharides (NIFEREX) 150 MG capsule Take 150 mg by mouth daily.    Marland Kitchen leflunomide (ARAVA) 10 MG tablet Take 10 mg by mouth daily.    Marland Kitchen levothyroxine (SYNTHROID, LEVOTHROID) 50 MCG tablet Take 50 mcg by mouth daily.      Marland Kitchen lisinopril  (PRINIVIL,ZESTRIL) 20 MG tablet Take 20 mg by mouth daily.      . metoprolol succinate (TOPROL-XL) 25 MG 24 hr tablet Take 1 tablet (25 mg total) by mouth daily. 90 tablet 3  . Multiple Vitamin (MULTIVITAMIN WITH MINERALS) TABS tablet Take 1 tablet by mouth daily.    . pantoprazole (PROTONIX) 40 MG tablet Take 40 mg by mouth daily.      . pravastatin (PRAVACHOL) 40 MG tablet Take 40 mg by mouth daily.      Marland Kitchen torsemide (DEMADEX) 100 MG tablet Take 50 mg by mouth daily.    . traMADol (ULTRAM) 50 MG tablet Take 100 mg by mouth 2 (two) times daily.     . vitamin B-12 (CYANOCOBALAMIN) 1000 MCG tablet Take 1,000 mcg by mouth daily. 1 tab 2x weekly    . warfarin (COUMADIN) 3 MG tablet TAKE ONE TABLET BY MOUTH EVERY DAY 28 tablet 0  . zoster vaccine live, PF, (ZOSTAVAX) 28768 UNT/0.65ML injection Inject 19,400 Units into the skin once. If given in pharmacy fax report to Dr Denyse Amass [phone] 1 each 0   No current facility-administered medications for this visit.   Allergies  Allergen Reactions  . Cefuroxime Anaphylaxis    Throat swelling  . Cephalexin Rash     Exam:  BP 118/58 mmHg  Pulse 59  Wt 157 lb (71.215 kg) Gen: Well NAD HEENT: EOMI,  MMM Lungs: Normal work of breathing. CTABL Heart:  MRG Abd: NABS, Soft. Nondistended, Nontender Exts: Brisk capillary refill, warm and well perfused.  MSK: Tender palpation of the left lateral greater trochanteric region   Point-of-care INR 2.4  No results found for this or any previous visit (from the past 24 hour(s)). No results found.   Please see individual assessment and plan sections.   Patient was given Prevnar 13 vaccine, Tdap vaccine prior to discharge. She was prescribed zoster vaccine.

## 2015-06-18 ENCOUNTER — Ambulatory Visit (INDEPENDENT_AMBULATORY_CARE_PROVIDER_SITE_OTHER): Payer: Medicare PPO | Admitting: Family

## 2015-06-18 ENCOUNTER — Encounter (HOSPITAL_COMMUNITY): Payer: Medicare PPO

## 2015-06-18 ENCOUNTER — Other Ambulatory Visit: Payer: Self-pay | Admitting: *Deleted

## 2015-06-18 ENCOUNTER — Ambulatory Visit (HOSPITAL_COMMUNITY)
Admission: RE | Admit: 2015-06-18 | Discharge: 2015-06-18 | Disposition: A | Discharge status: Home / Self Care | Payer: Medicare PPO | Source: Ambulatory Visit | Attending: Vascular Surgery | Admitting: Vascular Surgery

## 2015-06-18 ENCOUNTER — Encounter: Payer: Self-pay | Admitting: Family

## 2015-06-18 ENCOUNTER — Ambulatory Visit: Payer: Medicare PPO | Admitting: Family

## 2015-06-18 VITALS — BP 160/84 | HR 99 | Ht 62.0 in | Wt 156.3 lb

## 2015-06-18 DIAGNOSIS — K551 Chronic vascular disorders of intestine: Secondary | ICD-10-CM | POA: Insufficient documentation

## 2015-06-18 DIAGNOSIS — Z48812 Encounter for surgical aftercare following surgery on the circulatory system: Secondary | ICD-10-CM

## 2015-06-18 DIAGNOSIS — Y838 Other surgical procedures as the cause of abnormal reaction of the patient, or of later complication, without mention of misadventure at the time of the procedure: Secondary | ICD-10-CM | POA: Insufficient documentation

## 2015-06-18 DIAGNOSIS — Z959 Presence of cardiac and vascular implant and graft, unspecified: Secondary | ICD-10-CM | POA: Diagnosis not present

## 2015-06-18 DIAGNOSIS — I6523 Occlusion and stenosis of bilateral carotid arteries: Secondary | ICD-10-CM

## 2015-06-18 DIAGNOSIS — K55069 Acute infarction of intestine, part and extent unspecified: Secondary | ICD-10-CM | POA: Diagnosis not present

## 2015-06-18 DIAGNOSIS — E119 Type 2 diabetes mellitus without complications: Secondary | ICD-10-CM | POA: Diagnosis not present

## 2015-06-18 DIAGNOSIS — I774 Celiac artery compression syndrome: Secondary | ICD-10-CM

## 2015-06-18 DIAGNOSIS — I771 Stricture of artery: Secondary | ICD-10-CM

## 2015-06-18 DIAGNOSIS — I509 Heart failure, unspecified: Secondary | ICD-10-CM | POA: Insufficient documentation

## 2015-06-18 DIAGNOSIS — I110 Hypertensive heart disease with heart failure: Secondary | ICD-10-CM | POA: Diagnosis not present

## 2015-06-18 DIAGNOSIS — I714 Abdominal aortic aneurysm, without rupture, unspecified: Secondary | ICD-10-CM

## 2015-06-18 DIAGNOSIS — T82856A Stenosis of peripheral vascular stent, initial encounter: Secondary | ICD-10-CM | POA: Insufficient documentation

## 2015-06-18 NOTE — Patient Instructions (Signed)
Stroke Prevention Some medical conditions and behaviors are associated with an increased chance of having a stroke. You may prevent a stroke by making healthy choices and managing medical conditions. HOW CAN I REDUCE MY RISK OF HAVING A STROKE?   Stay physically active. Get at least 30 minutes of activity on most or all days.  Do not smoke. It may also be helpful to avoid exposure to secondhand smoke.  Limit alcohol use. Moderate alcohol use is considered to be:  No more than 2 drinks per day for men.  No more than 1 drink per day for nonpregnant women.  Eat healthy foods. This involves:  Eating 5 or more servings of fruits and vegetables a day.  Making dietary changes that address high blood pressure (hypertension), high cholesterol, diabetes, or obesity.  Manage your cholesterol levels.  Making food choices that are high in fiber and low in saturated fat, trans fat, and cholesterol may control cholesterol levels.  Take any prescribed medicines to control cholesterol as directed by your health care provider.  Manage your diabetes.  Controlling your carbohydrate and sugar intake is recommended to manage diabetes.  Take any prescribed medicines to control diabetes as directed by your health care provider.  Control your hypertension.  Making food choices that are low in salt (sodium), saturated fat, trans fat, and cholesterol is recommended to manage hypertension.  Ask your health care provider if you need treatment to lower your blood pressure. Take any prescribed medicines to control hypertension as directed by your health care provider.  If you are 18-39 years of age, have your blood pressure checked every 3-5 years. If you are 40 years of age or older, have your blood pressure checked every year.  Maintain a healthy weight.  Reducing calorie intake and making food choices that are low in sodium, saturated fat, trans fat, and cholesterol are recommended to manage  weight.  Stop drug abuse.  Avoid taking birth control pills.  Talk to your health care provider about the risks of taking birth control pills if you are over 35 years old, smoke, get migraines, or have ever had a blood clot.  Get evaluated for sleep disorders (sleep apnea).  Talk to your health care provider about getting a sleep evaluation if you snore a lot or have excessive sleepiness.  Take medicines only as directed by your health care provider.  For some people, aspirin or blood thinners (anticoagulants) are helpful in reducing the risk of forming abnormal blood clots that can lead to stroke. If you have the irregular heart rhythm of atrial fibrillation, you should be on a blood thinner unless there is a good reason you cannot take them.  Understand all your medicine instructions.  Make sure that other conditions (such as anemia or atherosclerosis) are addressed. SEEK IMMEDIATE MEDICAL CARE IF:   You have sudden weakness or numbness of the face, arm, or leg, especially on one side of the body.  Your face or eyelid droops to one side.  You have sudden confusion.  You have trouble speaking (aphasia) or understanding.  You have sudden trouble seeing in one or both eyes.  You have sudden trouble walking.  You have dizziness.  You have a loss of balance or coordination.  You have a sudden, severe headache with no known cause.  You have new chest pain or an irregular heartbeat. Any of these symptoms may represent a serious problem that is an emergency. Do not wait to see if the symptoms will   go away. Get medical help at once. Call your local emergency services (911 in [place] Do not drive yourself to the hospital.   This information is not intended to replace advice given to you by your health care provider. Make sure you discuss any questions you have with your health care provider.   Document Released: 05/05/2004 Document Revised: 04/18/2014 Document Reviewed:  09/28/2012 Elsevier Interactive Patient Education 2016 Else[employer] Inc[employer]    Chronic Mesenteric Ischemia Mesenteric ischemia is a deficiency of blood in an area of the intestine supplied by an artery that supports the intestine. Chronic mesenteric ischemia, also called intestinal angina, is a long-term condition. It happens when an artery or vein that supports the intestine gradually becomes blocked or narrow, restricting the blood supply to the intestine. When the blood supply to the intestine is severely restricted, the intestines cannot function properly because needed oxygen cannot reach them.  CAUSES   Fatty deposits that build up in an artery or vein but have not yet restricted blood flow entirely.  Differences in some people's anatomy.  Rapid weight loss.  Weakened areas in blood vessel walls (aneurysms).  Swelling and inflammation of blood vessels (such as from fibromuscular dysplasia and arteritis).  Disorders of blood clotting.  Scarring and fibrosis of blood vessels after radiation therapy.  Blood vessel problems after drug use, such as use of cocaine. RISK FACTORS  Being female.  Being over age 50 with a history of coronary or vascular disease.  Smoking.  Congestive heart failure.  Diabetes.  High cholesterol.  High blood pressure (hypertension). SIGNS AND SYMPTOMS   Severe stomachache. Some people become fearful of eating because of pain.   Abdominal pain or cramps that develop about 30 minutes after a meal.   Abdominal pain after eating that becomes worse over time.   Diarrhea.   Nausea.   Vomiting.   Bloating.   Weight loss. DIAGNOSIS  Chronic mesenteric ischemia is often diagnosed after the person's history is taken, a physical exam is done, and tests are taken. Tests may include:  Ultrasounds.  CT scans.  Angiography. This is an imaging test that uses a dye to obtain a picture of blood flow to the intestine.  Endoscopy. This  involves putting a scope through the mouth, down the throat, and into the stomach and intestine to view the intestinal wall and take small tissue samples (biopsies).  Tonometry. In this test a tiny probe is passed through the mouth and into the stomach or intestine and left in place for 24 hours or more. It measures the output of carbon dioxide by the affected tissues. TREATMENT  Treatment may include:   Medicines to reduce blood clotting and increase blood flow.   Surgery to remove the blockage, repair arteries or veins, and restore blood flow. This may involve:   Angioplasty. This is surgery to widen the affected artery, reduce the blockage, and sometimes insert a small, mesh tube (stent).   Bypass surgery. This may be performed to bypass the blockage and reconnect healthy arteries or veins.   A stent in the affected area to help keep blocked arteries open. HOME CARE INSTRUCTIONS  Only take over-the-counter or prescription medicines as directed by your health care provider.   Keep all follow-up appointments as directed by your health care provider.   Prevent the condition from occurring by:  Doing regular exercise.  Keeping a healthy weight.  Keeping a healthy diet.  Managing cholesterol levels.  Keeping blood pressure and heart rhythm problems   under control.  Not smoking. SEEK IMMEDIATE MEDICAL CARE IF:  You have severe abdominal pain.   You notice blood in your stool.   You have nausea, vomiting, or diarrhea.   You have a fever. MAKE SURE YOU:  Understand these instructions.  Will watch your condition.  Will get help right away if you are not doing well or get worse.   This information is not intended to replace advice given to you by your health care provider. Make sure you discuss any questions you have with your health care provider.   Document Released: 11/15/2010 Document Revised: 11/28/2012 Document Reviewed: 09/26/2012 Elsevier Interactive  Patient Education 2016 Elsevier Inc.  

## 2015-06-18 NOTE — Assessment & Plan Note (Signed)
Doing well. No changes plan.

## 2015-06-18 NOTE — Progress Notes (Signed)
Established Mesenteric Ischemia  History of Present Illness  [name] is a 82 y.o. (07/05/1929) female patient of Dr. Darrick Penna who is s/p celiac artery stent placed 09/02/08 and restented 05/31/13. 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  Since the above procedure pt states she has had no more post prandial abdominal pain, did have post prandial abdominal pain prior to this. She states her weight has either increased or stayed the same  She is not really a candidate for open operation. Additionally she has bilateral moderate carotid stenosis. She returns today for routine surveillance.  She is currently living at [facility]. She is on Coumadin for atrial fibrillation. Other chronic medical problems include diabetes hypertension congestive heart failure degenerative joint disease.   She fell in January 2017, fractured her left hip, had an ORIF at [facility].  Her RA is bothering her more than anything.  Pt denies any history of stroke or TIA. She takes a daily ASA and a statin. Pt denies any history of MI, but has a history of CABG x3. Pt reports DM neuropathy as manifested by shooting pains from feet that travel upwards at times. Mostly at night, her DM is diet controlled. She denies non healing wounds.    Past Medical History  Diagnosis Date  . Diabetes mellitus age 86  . Hypertension   . Arthritis   . Joint pain   . Atrial fibrillation (HCC)   . CHF (congestive heart failure) (HCC)   . Mesenteric ischemia   . Acute pancreatitis   . GI bleed   . Peripheral vascular disease (HCC)   . GERD (gastroesophageal reflux disease)   . Thyroid disease   . DJD (degenerative joint disease)   . CAD (coronary artery disease)   . DVT (deep venous thrombosis) (HCC)   . Anemia   . Carotid artery occlusion     Social History Social History  Substance Use Topics  . Smoking status: Never Smoker   . Smokeless tobacco:  Never Used  . Alcohol Use: No    Family History Family History  Problem Relation Age of Onset  . Cancer Mother     ovarian  . Other Father     suicide  . Coronary artery disease Sister   . Other Sister     alzheimers  . Heart disease Sister     Before age 89  . Diabetes Sister   . Cancer Daughter     spinal tumor  . Diabetes Daughter   . Hypertension Daughter   . Diabetes Son   . Hyperlipidemia Son   . Hypertension Son     Surgical History Past Surgical History  Procedure Laterality Date  . Embolectomy  2009    right leg  . Celiac artery stent    . Fasciotomy      right leg  . Coronary artery bypass graft    . Total knee arthroplasty      bilateral  . Appendectomy    . Tubal ligation    . Joint replacement    . Visceral angiogram N/A 05/31/2013    Procedure: MESSENTERIC [name];  Surgeon: [name], MD;  Location: [facility] CATH LAB;  Service: Cardiovascular;  Laterality: N/A;  . Hip fracture surgery Left     Allergies  Allergen Reactions  . Cefuroxime Anaphylaxis    Throat swelling  . Cephalexin Rash    Current Outpatient Prescriptions  Medication Sig Dispense Refill  .  alendronate (FOSAMAX) 70 MG tablet Take by mouth.    Marland Kitchen aspirin 81 MG chewable tablet Chew by mouth.    Marland Kitchen aspirin EC 81 MG tablet Take 1 tablet (81 mg total) by mouth daily. 90 tablet 3  . Calcium Carb-Cholecalciferol (CALCIUM 600+D) 600-800 MG-UNIT TABS Take by mouth.    . calcium carbonate (OS-CAL - DOSED IN MG OF ELEMENTAL CALCIUM) 1250 MG tablet Take 1 tablet by mouth daily with breakfast.    . cholecalciferol (VITAMIN D) 1000 UNITS tablet Take 2,000 Units by mouth daily.    . clonazePAM (KLONOPIN) 0.5 MG tablet Take 0.5 mg by mouth daily.      Marland Kitchen diltiazem (CARDIZEM CD) 360 MG 24 hr capsule Take 360 mg by mouth daily.      Marland Kitchen docusate sodium (COLACE) 100 MG capsule Take 100 mg by mouth 2 (two) times daily.    . Ergocalciferol (VITAMIN D2) 2000 units TABS Take by mouth.    Marland Kitchen glucose  blood test strip Once daily E11.29 100 each 12  . iron polysaccharides (NIFEREX) 150 MG capsule Take 150 mg by mouth daily.    Marland Kitchen leflunomide (ARAVA) 10 MG tablet Take 10 mg by mouth daily.    Marland Kitchen levothyroxine (SYNTHROID, LEVOTHROID) 50 MCG tablet Take 50 mcg by mouth daily.      Marland Kitchen lisinopril (PRINIVIL,ZESTRIL) 20 MG tablet Take 20 mg by mouth daily.      . metoprolol succinate (TOPROL-XL) 25 MG 24 hr tablet Take 1 tablet (25 mg total) by mouth daily. 90 tablet 3  . Multiple Vitamin (MULTIVITAMIN WITH MINERALS) TABS tablet Take 1 tablet by mouth daily.    . pantoprazole (PROTONIX) 40 MG tablet Take 40 mg by mouth daily.      . pravastatin (PRAVACHOL) 40 MG tablet Take 40 mg by mouth daily.      Marland Kitchen torsemide (DEMADEX) 100 MG tablet Take 50 mg by mouth daily.    . traMADol (ULTRAM) 50 MG tablet Take 100 mg by mouth 2 (two) times daily.     . vitamin B-12 (CYANOCOBALAMIN) 1000 MCG tablet Take 1,000 mcg by mouth daily. 1 tab 2x weekly    . warfarin (COUMADIN) 3 MG tablet TAKE ONE TABLET BY MOUTH EVERY DAY 28 tablet 0  . zoster vaccine live, PF, (ZOSTAVAX) 16109 UNT/0.65ML injection Inject 19,400 Units into the skin once. If given in pharmacy fax report to Dr Denyse Amass [phone] (Patient not taking: Reported on 06/18/2015) 1 each 0   No current facility-administered medications for this visit.    ROS: see HPI for pertinent positives and negatives    Physical Examination  Filed Vitals:   06/18/15 1038 06/18/15 1041  BP: 151/78 160/84  Pulse: 99   Height:  (1.575 m)   Weight: 156 lb 4.8 oz (70.897 kg)   SpO2: 97%    Body mass index is 28.58 kg/(m^2).  General: A&O x 3, WDWN.  Gait: slow, deliberate, using rolling walker  Pulmonary: Sym exp, good air movt, CTAB, no rales, rhonchi, or wheezing.  Cardiac: irregular rhythm and rate  Vascular: Vessel Right Left  Radial 1+Palpable 1+Palpable  Carotid with bruit without bruit  Aorta Not palpable N/A  Popliteal Not  palpable Not palpable  PT Not Palpable Not Palpable  DP 2+Palpable 1+Palpable   Gastrointestinal: soft, NTND, -G/R, - HSM, - palpable masses, - CVAT B.  Musculoskeletal: M/S 3/5 throughout, Extremities without ischemic changes. Rheumatoid arthritis deformities in hands.  Neurologic: Pain and light touch intact in extremities,  Motor exam as listed above          Non-Invasive Vascular Imaging  Mesenteric Duplex (Date: 06/18/2015):  MESENTERIC ARTERY DUPLEX EVALUATION    INDICATION: Celiac artery stent    PREVIOUS INTERVENTION(S): Celiac artery stent placed 09/02/2008 and 05/31/2013; Known SMA occlusion    DUPLEX EXAM:     ARTERY PEAK SYSTOLIC VELOCITY (cm/s) IMAGE  Aorta 62 -  Celiac 375 stenotic  Superior Mesenteric Artery - Proximal Occluded   Superior Mesenteric Artery - Mid -   Inferior Mesenteric Artery -   Hepatic    Splenic       ADDITIONAL FINDINGS:     IMPRESSION: 1. Suboptimal examination due to bowel gas and patients inability to be positioned 2. Greater than 70% stenosis of celiac stent, limited visualization, may possible be elevated due to known SMA occlusion 3. The mid to distal abdominal aorta measures 3.4 x 3.5 cms    Medical Decision Making  [name] is a 82 y.o. female who who is s/p celiac artery stent placed 09/02/08 and restented 05/31/13. 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 Since the above procedure pt states she has had no more post prandial abdominal pain, did have post prandial abdominal pain prior to this. She states her weight has either increased or stayed the same She is not really a candidate for open operation. Additionally she has bilateral moderate carotid stenosis.  Today's mesenteric artery duplex was a suboptimal examination due to bowel gas and patients inability to be positioned Greater than 70% stenosis of celiac stent, limited visualization, may possible be elevated due to known SMA  occlusion The mid to distal abdominal aorta measures 3.4 x 3.5 cms (small abdominal aortic aneurysm).   Pt states "I really don't want any more surgery". I discussed with Dr. Darrick Penna pt HPI, mesenteric duplex results today compared to a year ago, and September 2015 was last carotid duplex which demonstrated 60-79% bilateral ICA stenosis.  We can wait until pt returns in a year for mesenteric duplex to perform carotid duplex.  Face to face time with patient was 25 minutes. Over 50% of this time was spent on counseling and coordination of care.    Based on her exam and studies, I have offered the patient carotid duplex and mesenteric artery duplex in 1 year.  I discussed in depth with the patient the nature of atherosclerosis, and emphasized the importance of maximal medical management including strict control of blood pressure, blood glucose, and lipid levels, obtaining regular exercise, and cessation of smoking.    The patient is aware that without maximal medical management the underlying atherosclerotic disease process will progress, limiting the benefit of any interventions. The patient is currently on a statin: pravastatin 40 mg daily. The patient is currently on an anti-platelet: ASA 81 mg daily.  Thank you for allowing [place] to participate in this patient's care.  Charisse March, RN, MSN, FNP-C [facility] Office: [phone]  Clinic MD: Darrick Penna  06/18/2015, 10:54 AM

## 2015-06-18 NOTE — Assessment & Plan Note (Signed)
Left lateral hip pain likely due to continued. Trochanteric bursitis following hip fracture. Plan for continued home health PT including iontophoresis. Recheck in one month.

## 2015-06-18 NOTE — Assessment & Plan Note (Signed)
Doing well. INR goal. No changes

## 2015-06-24 ENCOUNTER — Other Ambulatory Visit: Payer: Self-pay | Admitting: Family Medicine

## 2015-06-25 NOTE — Telephone Encounter (Signed)
Please review the refill requests and advise if they are appropriate. Thanks.

## 2015-07-09 ENCOUNTER — Encounter: Payer: Self-pay | Admitting: Family Medicine

## 2015-07-09 ENCOUNTER — Ambulatory Visit (INDEPENDENT_AMBULATORY_CARE_PROVIDER_SITE_OTHER): Payer: Medicare PPO | Admitting: Family Medicine

## 2015-07-09 VITALS — BP 102/54 | HR 42 | Temp 98.3°F | Ht 62.0 in | Wt 155.0 lb

## 2015-07-09 DIAGNOSIS — I482 Chronic atrial fibrillation, unspecified: Secondary | ICD-10-CM

## 2015-07-09 DIAGNOSIS — R001 Bradycardia, unspecified: Secondary | ICD-10-CM

## 2015-07-09 LAB — GLUCOSE, POCT (MANUAL RESULT ENTRY): POC GLUCOSE: 168 mg/dL — AB (ref 70–99)

## 2015-07-09 MED ORDER — DILTIAZEM HCL ER COATED BEADS 180 MG PO CP24: 180.0000 mg | ORAL_CAPSULE | Freq: Every day | ORAL | Status: DC

## 2015-07-09 NOTE — Patient Instructions (Signed)
Thank you for coming in today. Get labs.  Return Tuesday for recheck.  Take new dose of Diltiazem (180mg ) daily.  Go to the ER if you get worse.  Call or go to the emergency room if you get worse, have trouble breathing, have chest pains, or palpitations.   Bradycardia Bradycardia is a slower-than-normal heart rate. A normal resting heart rate for an adult ranges from 60 to 100 beats per minute. With bradycardia, the resting heart rate is less than 60 beats per minute. Bradycardia is a problem if your heart cannot pump enough oxygen-rich blood through your body. Bradycardia is not a problem for everyone. For some healthy adults, a slow resting heart rate is normal.  CAUSES  Bradycardia may be caused by:  A problem with the heart's electrical system, such as heart block.  A problem with the heart's natural pacemaker (sinus node).  Heart disease, damage, or infection.  Certain medicines that treat heart conditions.  Certain conditions, such as hypothyroidism and obstructive sleep apnea. RISK FACTORS  Risk factors include:  Being 67 or older.  Having high blood pressure (hypertension), high cholesterol (hyperlipidemia), or diabetes.  Drinking heavily, using tobacco products, or using drugs.  Being stressed. SIGNS AND SYMPTOMS  Signs and symptoms include:  Light-headedness.  Faintingor near fainting.  Fatigue and weakness.  Shortness of breath.  Chest pain (angina).  Drowsiness.  Confusion.  Dizziness. DIAGNOSIS  Diagnosis of bradycardia may include:  A physical exam.  An electrocardiogram (ECG).  Blood tests. TREATMENT  Treatment for bradycardia may include:  Treatment of an underlying condition.  Pacemaker placement. A pacemaker is a small, battery-powered device that is placed under the skin and is programmed to sense your heartbeats. If your heart rate is lower than the programmed rate, the pacemaker will pace your heart.  Changing your medicines or  dosages. HOME CARE INSTRUCTIONS  Take medicines only as directed by your health care provider.  Manage any health conditions that contribute to bradycardia as directed by your health care provider.  Follow a heart-healthy diet. A dietitian can help educate you on healthy food options and changes.  Follow an exercise program approved by your health care provider.  Maintain a healthy weight. Lose weight as approved by your health care provider.  Do not use tobacco products, including cigarettes, chewing tobacco, or electronic cigarettes. If you need help quitting, ask your health care provider.  Do not use illegal drugs.  Limit alcohol intake to no more than 1 drink per day for nonpregnant women and 2 drinks per day for men. One drink equals 12 ounces of beer, 5 ounces of wine, or 1 ounces of hard liquor.  Keep all follow-up visits as directed by your health care provider. This is important. SEEK MEDICAL CARE IF:  You feel light-headed or dizzy.  You almost faint.  You feel weak or are easily fatigued during physical activity.  You experience confusion or have memory problems. SEEK IMMEDIATE MEDICAL CARE IF:   You faint.  You have an irregular heartbeat.  You have chest pain.  You have trouble breathing. MAKE SURE YOU:   Understand these instructions.  Will watch your condition.  Will get help right away if you are not doing well or get worse.   This information is not intended to replace advice given to you by your health care provider. Make sure you discuss any questions you have with your health care provider.   Document Released: 12/18/2001 Document Revised: 04/18/2014 Document Reviewed: 07/03/2013  Chartered certified accountant Patient Education [employer].

## 2015-07-09 NOTE — Progress Notes (Signed)
[name] is a 82 y.o. female who presents to [facility] [name]: Primary Care today for dizziness lightheadedness and bloating. Patient fell ill yesterday with bloating and felt somewhat lightheaded and dizzy. Her blood pressure was measured and found to be the systolic 80s and 16X range. She denies any chest pain palpitations or shortness of breath. She denies any new medication changes. She feels much better this morning and is essentially asymptomatic.   Past Medical History  Diagnosis Date  . Diabetes mellitus age 24  . Hypertension   . Arthritis   . Joint pain   . Atrial fibrillation (HCC)   . CHF (congestive heart failure) (HCC)   . Mesenteric ischemia   . Acute pancreatitis   . GI bleed   . Peripheral vascular disease (HCC)   . GERD (gastroesophageal reflux disease)   . Thyroid disease   . DJD (degenerative joint disease)   . CAD (coronary artery disease)   . DVT (deep venous thrombosis) (HCC)   . Anemia   . Carotid artery occlusion    Past Surgical History  Procedure Laterality Date  . Embolectomy  2009    right leg  . Celiac artery stent    . Fasciotomy      right leg  . Coronary artery bypass graft    . Total knee arthroplasty      bilateral  . Appendectomy    . Tubal ligation    . Joint replacement    . Visceral angiogram N/A 05/31/2013    Procedure: MESSENTERIC [name];  Surgeon: [name], MD;  Location: [facility] CATH LAB;  Service: Cardiovascular;  Laterality: N/A;  . Hip fracture surgery Left    Social History  Substance Use Topics  . Smoking status: Never Smoker   . Smokeless tobacco: Never Used  . Alcohol Use: No   family history includes Cancer in her daughter and mother; Coronary artery disease in her sister; Diabetes in her daughter, sister, and son; Heart disease in her sister; Hyperlipidemia in her son; Hypertension in her daughter and son; Other in her  father and sister.  ROS as above Medications: Current Outpatient Prescriptions  Medication Sig Dispense Refill  . alendronate (FOSAMAX) 70 MG tablet Take by mouth.    Marland Kitchen aspirin 81 MG chewable tablet Chew by mouth.    Marland Kitchen aspirin EC 81 MG tablet Take 1 tablet (81 mg total) by mouth daily. 90 tablet 3  . Calcium Carb-Cholecalciferol (CALCIUM 600+D) 600-800 MG-UNIT TABS Take by mouth.    . calcium carbonate (OS-CAL - DOSED IN MG OF ELEMENTAL CALCIUM) 1250 MG tablet Take 1 tablet by mouth daily with breakfast.    . cholecalciferol (VITAMIN D) 1000 UNITS tablet Take 2,000 Units by mouth daily.    . clonazePAM (KLONOPIN) 0.5 MG tablet Take 0.5 mg by mouth daily.      Marland Kitchen diltiazem (CARDIZEM CD) 360 MG 24 hr capsule Take 360 mg by mouth daily.      Marland Kitchen diltiazem (TIAZAC) 360 MG 24 hr capsule TAKE ONE CAPSULE BY MOUTH EVERY DAY 28 capsule 0  . docusate sodium (COLACE) 100 MG capsule Take 100 mg by mouth 2 (two) times daily.    . Ergocalciferol (VITAMIN D2) 2000 units TABS Take by mouth.    . FERREX 150 150 MG capsule TAKE ONE CAPSULE BY MOUTH EVERY DAY 28 capsule 0  . glucose blood test strip Once daily E11.29 100 each 12  . leflunomide (ARAVA)  10 MG tablet Take 10 mg by mouth daily.    Marland Kitchen levothyroxine (SYNTHROID, LEVOTHROID) 50 MCG tablet Take 50 mcg by mouth daily.      Marland Kitchen lisinopril (PRINIVIL,ZESTRIL) 20 MG tablet Take 20 mg by mouth daily.      . metoprolol succinate (TOPROL-XL) 25 MG 24 hr tablet Take 1 tablet (25 mg total) by mouth daily. 90 tablet 3  . Multiple Vitamin (MULTIVITAMIN WITH MINERALS) TABS tablet Take 1 tablet by mouth daily.    . pantoprazole (PROTONIX) 40 MG tablet Take 40 mg by mouth daily.      . pravastatin (PRAVACHOL) 40 MG tablet Take 40 mg by mouth daily.      Marland Kitchen torsemide (DEMADEX) 100 MG tablet Take 50 mg by mouth daily.    . traMADol (ULTRAM) 50 MG tablet Take 100 mg by mouth 2 (two) times daily.     . vitamin B-12 (CYANOCOBALAMIN) 1000 MCG tablet Take 1,000 mcg by mouth  daily. 1 tab 2x weekly    . warfarin (COUMADIN) 3 MG tablet TAKE ONE TABLET BY MOUTH EVERY DAY 28 tablet 0  . zoster vaccine live, PF, (ZOSTAVAX) 62952 UNT/0.65ML injection Inject 19,400 Units into the skin once. If given in pharmacy fax report to Dr Denyse Amass [phone] 1 each 0   No current facility-administered medications for this visit.   Allergies  Allergen Reactions  . Cefuroxime Anaphylaxis    Throat swelling  . Cephalexin Rash     Exam:  BP 102/54 mmHg  Pulse 42  Temp(Src) 98.3 F (36.8 C) (Oral)  Ht 5\' 2"  (1.575 m)  Wt 155 lb (70.308 kg)  BMI 28.34 kg/m2  SpO2 97%   Orthostatic VS for the past 24 hrs:  BP- Lying Pulse- Lying BP- Sitting Pulse- Sitting BP- Standing at 0 minutes Pulse- Standing at 0 minutes  07/09/15 1543 (!) 112/38 mmHg (!) 45 112/48 mmHg (!) 45 108/48 mmHg (!) 48       Gen: Well NAD HEENT: EOMI,  MMM Lungs: Normal work of breathing. CTABL Heart: Regular but bradycardia into the 40s and 50s no MRG Abd: NABS, Soft. Nondistended, Nontender Exts: Brisk capillary refill, warm and well perfused.   12 Lead EKG shows atrial fibrillation with a rate of 40s bpm. Some PACs are present. No ST segment elevation or depression.  No results found for this or any previous visit (from the past 24 hour(s)). No results found.   82 year old woman with atrophic fibrillation with new onset dizziness. She currently is feeling pretty well however her ventricular weight is quite slow into the 40. She is managing pretty well and has a normal blood pressure currently. However will obtain labs and decreased diltiazem from 360 mg extended release daily down to 180 mg extended release daily. Continue current metoprolol 25 mg extended release daily dose. Follow-up in 4-5 days for recheck and reevaluation. Additionally patient has an appointment upcoming with cardiology next month. I think this will be valuable for comanagement of this likely continuing to be bothersome condition  in the future. I carefully reviewed risk factors and alert symptoms to cause an emergency room visit.

## 2015-07-10 LAB — CBC
HEMATOCRIT: 35.3 % — AB (ref 36.0–46.0)
HEMOGLOBIN: 11 g/dL — AB (ref 12.0–15.0)
MCH: 26.7 pg (ref 26.0–34.0)
MCHC: 31.2 g/dL (ref 30.0–36.0)
MCV: 85.7 fL (ref 78.0–100.0)
MPV: 9.6 fL (ref 8.6–12.4)
Platelets: 307 10*3/uL (ref 150–400)
RBC: 4.12 MIL/uL (ref 3.87–5.11)
RDW: 17 % — ABNORMAL HIGH (ref 11.5–15.5)
WBC: 8.2 10*3/uL (ref 4.0–10.5)

## 2015-07-10 LAB — COMPREHENSIVE METABOLIC PANEL
ALBUMIN: 3.7 g/dL (ref 3.6–5.1)
ALK PHOS: 69 U/L (ref 33–130)
ALT: 10 U/L (ref 6–29)
AST: 15 U/L (ref 10–35)
BILIRUBIN TOTAL: 0.3 mg/dL (ref 0.2–1.2)
BUN: 44 mg/dL — ABNORMAL HIGH (ref 7–25)
CALCIUM: 8.8 mg/dL (ref 8.6–10.4)
CO2: 30 mmol/L (ref 20–31)
Chloride: 102 mmol/L (ref 98–110)
Creat: 1.71 mg/dL — ABNORMAL HIGH (ref 0.60–0.88)
Glucose, Bld: 125 mg/dL — ABNORMAL HIGH (ref 65–99)
Potassium: 4.4 mmol/L (ref 3.5–5.3)
Sodium: 138 mmol/L (ref 135–146)
Total Protein: 6.8 g/dL (ref 6.1–8.1)

## 2015-07-10 LAB — PROTIME-INR
INR: 1.76 — AB (ref <1.50)
PROTHROMBIN TIME: 20.8 s — AB (ref 11.6–15.2)

## 2015-07-10 NOTE — Progress Notes (Signed)
Quick Note:  Labs are pretty stable. We will have to recheck them next week. ______

## 2015-07-14 ENCOUNTER — Encounter: Payer: Self-pay | Admitting: Family Medicine

## 2015-07-14 ENCOUNTER — Ambulatory Visit (INDEPENDENT_AMBULATORY_CARE_PROVIDER_SITE_OTHER): Payer: Medicare PPO | Admitting: Family Medicine

## 2015-07-14 VITALS — BP 126/75 | HR 102 | Wt 155.0 lb

## 2015-07-14 DIAGNOSIS — I482 Chronic atrial fibrillation, unspecified: Secondary | ICD-10-CM

## 2015-07-14 DIAGNOSIS — Z7901 Long term (current) use of anticoagulants: Secondary | ICD-10-CM | POA: Diagnosis not present

## 2015-07-14 LAB — HM DIABETES EYE EXAM

## 2015-07-14 LAB — POCT INR: INR: 1.7

## 2015-07-14 MED ORDER — DILTIAZEM HCL ER COATED BEADS 240 MG PO CP24: 240.0000 mg | ORAL_CAPSULE | Freq: Every day | ORAL | Status: DC

## 2015-07-14 NOTE — Patient Instructions (Signed)
Thank you for coming in today. STOP diltiazem 180mg .  Start Diltiazem 240mg .  Recheck in 1 month.  Check hear rate and blood pressure next week and send the results in.  We will recheck Warfarin level next month.

## 2015-07-14 NOTE — Progress Notes (Signed)
[name] is a 82 y.o. female who presents to [name]: Primary Care today for follow-up heart rate and blood pressure. Patient was seen last week and found to be hypotensive and bradycardic. Her diltiazem dose was reduced from 360 9 g daily to 180 mg daily. She feels much better. Additionally last week her INR was rechecked and found to be 1.7. She notes that she ate a large meal of leafy green vegetables the proceeding day.   Past Medical History  Diagnosis Date  . Diabetes mellitus age 76  . Hypertension   . Arthritis   . Joint pain   . Atrial fibrillation (HCC)   . CHF (congestive heart failure) (HCC)   . Mesenteric ischemia   . Acute pancreatitis   . GI bleed   . Peripheral vascular disease (HCC)   . GERD (gastroesophageal reflux disease)   . Thyroid disease   . DJD (degenerative joint disease)   . CAD (coronary artery disease)   . DVT (deep venous thrombosis) (HCC)   . Anemia   . Carotid artery occlusion    Past Surgical History  Procedure Laterality Date  . Embolectomy  2009    right leg  . Celiac artery stent    . Fasciotomy      right leg  . Coronary artery bypass graft    . Total knee arthroplasty      bilateral  . Appendectomy    . Tubal ligation    . Joint replacement    . Visceral angiogram N/A 05/31/2013    Procedure: MESSENTERIC [name];  Surgeon: [name], MD;  Location: [facility] CATH LAB;  Service: Cardiovascular;  Laterality: N/A;  . Hip fracture surgery Left    Social History  Substance Use Topics  . Smoking status: Never Smoker   . Smokeless tobacco: Never Used  . Alcohol Use: No   family history includes Cancer in her daughter and mother; Coronary artery disease in her sister; Diabetes in her daughter, sister, and son; Heart disease in her sister; Hyperlipidemia in her son; Hypertension in her daughter and son; Other in her father and  sister.  ROS as above Medications: Current Outpatient Prescriptions  Medication Sig Dispense Refill  . alendronate (FOSAMAX) 70 MG tablet Take by mouth.    Marland Kitchen aspirin 81 MG chewable tablet Chew by mouth.    Marland Kitchen aspirin EC 81 MG tablet Take 1 tablet (81 mg total) by mouth daily. 90 tablet 3  . Calcium Carb-Cholecalciferol (CALCIUM 600+D) 600-800 MG-UNIT TABS Take by mouth.    . calcium carbonate (OS-CAL - DOSED IN MG OF ELEMENTAL CALCIUM) 1250 MG tablet Take 1 tablet by mouth daily with breakfast.    . cholecalciferol (VITAMIN D) 1000 UNITS tablet Take 2,000 Units by mouth daily.    . clonazePAM (KLONOPIN) 0.5 MG tablet Take 0.5 mg by mouth daily.      Marland Kitchen diltiazem (CARDIZEM CD) 240 MG 24 hr capsule Take 1 capsule (240 mg total) by mouth daily. 90 capsule 0  . docusate sodium (COLACE) 100 MG capsule Take 100 mg by mouth 2 (two) times daily.    . Ergocalciferol (VITAMIN D2) 2000 units TABS Take by mouth.    . FERREX 150 150 MG capsule TAKE ONE CAPSULE BY MOUTH EVERY DAY 28 capsule 0  . glucose blood test strip Once daily E11.29 100 each 12  . leflunomide (ARAVA) 10 MG tablet Take 10 mg by mouth daily.    Marland Kitchen  levothyroxine (SYNTHROID, LEVOTHROID) 50 MCG tablet Take 50 mcg by mouth daily.      Marland Kitchen lisinopril (PRINIVIL,ZESTRIL) 20 MG tablet Take 20 mg by mouth daily.      . metoprolol succinate (TOPROL-XL) 25 MG 24 hr tablet Take 1 tablet (25 mg total) by mouth daily. 90 tablet 3  . Multiple Vitamin (MULTIVITAMIN WITH MINERALS) TABS tablet Take 1 tablet by mouth daily.    . pantoprazole (PROTONIX) 40 MG tablet Take 40 mg by mouth daily.      . pravastatin (PRAVACHOL) 40 MG tablet Take 40 mg by mouth daily.      Marland Kitchen torsemide (DEMADEX) 100 MG tablet Take 50 mg by mouth daily.    . traMADol (ULTRAM) 50 MG tablet Take 100 mg by mouth 2 (two) times daily.     . vitamin B-12 (CYANOCOBALAMIN) 1000 MCG tablet Take 1,000 mcg by mouth daily. 1 tab 2x weekly    . warfarin (COUMADIN) 3 MG tablet TAKE ONE TABLET  BY MOUTH EVERY DAY 28 tablet 0  . zoster vaccine live, PF, (ZOSTAVAX) 82423 UNT/0.65ML injection Inject 19,400 Units into the skin once. If given in pharmacy fax report to Dr Denyse Amass [phone] 1 each 0   No current facility-administered medications for this visit.   Allergies  Allergen Reactions  . Cefuroxime Anaphylaxis    Throat swelling  . Cephalexin Rash     Exam:  BP 126/75 mmHg  Pulse 102  Wt 155 lb (70.308 kg)  SpO2 95% Gen: Well NAD HEENT: EOMI,  MMM Lungs: Normal work of breathing. CTABL Heart: Irregularly irregular no MRG Abd: NABS, Soft. Nondistended, Nontender Exts: Brisk capillary refill, warm and well perfused.   Results for orders placed or performed in visit on 07/14/15 (from the past 24 hour(s))  POCT INR     Status: None   Collection Time: 07/14/15  2:43 PM  Result Value Ref Range   INR 1.7    No results found.   Please see individual assessment and plan sections.

## 2015-07-14 NOTE — Assessment & Plan Note (Addendum)
Improve heart rate and blood pressure however her rate a little bit too high. Will increase diltiazem dose to 240 mg. Recheck in one month. Patient will check heart rate and blood pressure at home and one week and send results.  INR is a bit too low. She's been very well controlled previously. We'll continue the current stable dose and recheck in a month. If still low will increase warfarin or switch to Eliquis or Xarelto.

## 2015-07-17 ENCOUNTER — Telehealth: Payer: Self-pay

## 2015-07-17 ENCOUNTER — Other Ambulatory Visit: Payer: Self-pay | Admitting: Family Medicine

## 2015-07-17 NOTE — Telephone Encounter (Signed)
Pts daughter called to discuss pt last visit. She would like her uric acid level checked at her next visit as well as an exam of her toes and bottom of her feet. Advised that we would do this at the pts next visit.

## 2015-07-22 ENCOUNTER — Ambulatory Visit (INDEPENDENT_AMBULATORY_CARE_PROVIDER_SITE_OTHER): Payer: Medicare PPO | Admitting: Family Medicine

## 2015-07-22 ENCOUNTER — Ambulatory Visit: Payer: Medicare PPO | Admitting: Family Medicine

## 2015-07-22 VITALS — BP 123/73 | HR 77

## 2015-07-22 DIAGNOSIS — I482 Chronic atrial fibrillation, unspecified: Secondary | ICD-10-CM

## 2015-07-22 LAB — POCT INR: INR: 2.1

## 2015-07-23 NOTE — Progress Notes (Signed)
Nurse visit for blood pressure heart rate and INR checked.  Filed Vitals:   07/22/15 1430  BP: 123/73  Pulse: 77    Lab Results  Component Value Date   INR 2.1 07/22/2015   INR 1.7 07/14/2015   INR 1.76* 07/09/2015     Heart rate blood pressure and INR goal. Follow-up with me next month.

## 2015-07-27 ENCOUNTER — Encounter: Payer: Self-pay | Admitting: Family Medicine

## 2015-08-11 ENCOUNTER — Ambulatory Visit (INDEPENDENT_AMBULATORY_CARE_PROVIDER_SITE_OTHER): Payer: Medicare PPO | Admitting: Family Medicine

## 2015-08-11 ENCOUNTER — Encounter: Payer: Self-pay | Admitting: Family Medicine

## 2015-08-11 VITALS — BP 114/70 | HR 75 | Wt 158.0 lb

## 2015-08-11 DIAGNOSIS — I482 Chronic atrial fibrillation, unspecified: Secondary | ICD-10-CM

## 2015-08-11 NOTE — Patient Instructions (Signed)
Thank you for coming in today. Return in 2 months for recheck.  Make sure you tell the transportation people that your appointment is 30 minutes before it is scheduled.

## 2015-08-11 NOTE — Progress Notes (Signed)
[name] is a 82 y.o. female who presents to [name]: Primary Care today for follow-up heart rate. Patient feels great with no chest pains palpitations or shortness of breath. She denies any lightheadedness weakness or numbness. She thinks she is on the right dose of her medications. She notes that her last INR was just over 2.   Past Medical History  Diagnosis Date  . Diabetes mellitus age 73  . Hypertension   . Arthritis   . Joint pain   . Atrial fibrillation (HCC)   . CHF (congestive heart failure) (HCC)   . Mesenteric ischemia   . Acute pancreatitis   . GI bleed   . Peripheral vascular disease (HCC)   . GERD (gastroesophageal reflux disease)   . Thyroid disease   . DJD (degenerative joint disease)   . CAD (coronary artery disease)   . DVT (deep venous thrombosis) (HCC)   . Anemia   . Carotid artery occlusion    Past Surgical History  Procedure Laterality Date  . Embolectomy  2009    right leg  . Celiac artery stent    . Fasciotomy      right leg  . Coronary artery bypass graft    . Total knee arthroplasty      bilateral  . Appendectomy    . Tubal ligation    . Joint replacement    . Visceral angiogram N/A 05/31/2013    Procedure: MESSENTERIC [name];  Surgeon: [name], MD;  Location: [facility] CATH LAB;  Service: Cardiovascular;  Laterality: N/A;  . Hip fracture surgery Left    Social History  Substance Use Topics  . Smoking status: Never Smoker   . Smokeless tobacco: Never Used  . Alcohol Use: No   family history includes Cancer in her daughter and mother; Coronary artery disease in her sister; Diabetes in her daughter, sister, and son; Heart disease in her sister; Hyperlipidemia in her son; Hypertension in her daughter and son; Other in her father and sister.  ROS as above Medications: Current Outpatient Prescriptions  Medication Sig Dispense Refill  .  alendronate (FOSAMAX) 70 MG tablet Take by mouth.    Marland Kitchen aspirin 81 MG chewable tablet Chew by mouth.    Marland Kitchen aspirin EC 81 MG tablet Take 1 tablet (81 mg total) by mouth daily. 90 tablet 3  . Calcium Carb-Cholecalciferol (CALCIUM 600+D) 600-800 MG-UNIT TABS Take by mouth.    . calcium carbonate (OS-CAL - DOSED IN MG OF ELEMENTAL CALCIUM) 1250 MG tablet Take 1 tablet by mouth daily with breakfast.    . cholecalciferol (VITAMIN D) 1000 UNITS tablet Take 2,000 Units by mouth daily.    . clonazePAM (KLONOPIN) 0.5 MG tablet Take 0.5 mg by mouth daily.      Marland Kitchen diltiazem (CARDIZEM CD) 240 MG 24 hr capsule Take 1 capsule (240 mg total) by mouth daily. 90 capsule 0  . docusate sodium (COLACE) 100 MG capsule Take 100 mg by mouth 2 (two) times daily.    . Ergocalciferol (VITAMIN D2) 2000 units TABS Take by mouth.    . FERREX 150 150 MG capsule TAKE ONE CAPSULE BY MOUTH EVERY DAY 28 capsule 0  . glucose blood test strip Once daily E11.29 100 each 12  . leflunomide (ARAVA) 10 MG tablet Take 10 mg by mouth daily.    Marland Kitchen levothyroxine (SYNTHROID, LEVOTHROID) 50 MCG tablet Take 50 mcg by mouth daily.      Marland Kitchen  lisinopril (PRINIVIL,ZESTRIL) 20 MG tablet Take 20 mg by mouth daily.      . metoprolol succinate (TOPROL-XL) 25 MG 24 hr tablet Take 1 tablet (25 mg total) by mouth daily. 90 tablet 3  . Multiple Vitamin (MULTIVITAMIN WITH MINERALS) TABS tablet Take 1 tablet by mouth daily.    . pantoprazole (PROTONIX) 40 MG tablet Take 40 mg by mouth daily.      . pravastatin (PRAVACHOL) 40 MG tablet Take 40 mg by mouth daily.      Marland Kitchen torsemide (DEMADEX) 100 MG tablet Take 50 mg by mouth daily.    . traMADol (ULTRAM) 50 MG tablet Take 100 mg by mouth 2 (two) times daily.     . vitamin B-12 (CYANOCOBALAMIN) 1000 MCG tablet Take 1,000 mcg by mouth daily. 1 tab 2x weekly    . warfarin (COUMADIN) 3 MG tablet TAKE ONE TABLET BY MOUTH EVERY DAY 28 tablet 0  . zoster vaccine live, PF, (ZOSTAVAX) 29476 UNT/0.65ML injection Inject  19,400 Units into the skin once. If given in pharmacy fax report to Dr Denyse Amass [phone] 1 each 0   No current facility-administered medications for this visit.   Allergies  Allergen Reactions  . Cefuroxime Anaphylaxis    Throat swelling  . Cephalexin Rash     Exam:  BP 114/70 mmHg  Pulse 75  Wt 158 lb (71.668 kg) Gen: Well NAD Nontoxic appearing HEENT: EOMI,  MMM Lungs: Normal work of breathing. CTABL Heart: MRG Abd: NABS, Soft. Nondistended, Nontender Exts: Brisk capillary refill, warm and well perfused.   No results found for this or any previous visit (from the past 24 hour(s)). No results found.   Please see individual assessment and plan sections.

## 2015-08-11 NOTE — Assessment & Plan Note (Signed)
Right well-controlled. Continue current regimen. Recheck in a month or 2.

## 2015-08-14 ENCOUNTER — Other Ambulatory Visit: Payer: Self-pay | Admitting: Family Medicine

## 2015-08-24 ENCOUNTER — Telehealth: Payer: Self-pay

## 2015-08-24 NOTE — Telephone Encounter (Signed)
[facility] pharmacy called stating that the pt is due for a refill on the following medications: traMADol (ULTRAM) 50 MG tablet  clonazePAM (KLONOPIN) 0.5 MG tablet  These are historical medications. Please advise.

## 2015-08-25 MED ORDER — CLONAZEPAM 0.5 MG PO TABS: 0.5000 mg | ORAL_TABLET | Freq: Every day | ORAL | Status: DC

## 2015-08-25 MED ORDER — TRAMADOL HCL 50 MG PO TABS: 100.0000 mg | ORAL_TABLET | Freq: Two times a day (BID) | ORAL | Status: DC

## 2015-08-25 NOTE — Telephone Encounter (Signed)
rx faxed to [facility] pharmacy.

## 2015-08-25 NOTE — Telephone Encounter (Signed)
Meds refilled.

## 2015-08-26 ENCOUNTER — Telehealth: Payer: Self-pay | Admitting: Family Medicine

## 2015-08-26 MED ORDER — TRAMADOL HCL 50 MG PO TABS: ORAL_TABLET | ORAL | Status: DC

## 2015-08-31 NOTE — Telephone Encounter (Signed)
Note opened in error.

## 2015-09-10 ENCOUNTER — Other Ambulatory Visit: Payer: Self-pay | Admitting: Family Medicine

## 2015-09-10 NOTE — Telephone Encounter (Signed)
Pt is requesting a refill on multiple medication, many of which are historical. I have marked non historical meds with a green check. The unmarked meds are historical.  Please review and fill what is appropriate.

## 2015-09-23 ENCOUNTER — Other Ambulatory Visit: Payer: Self-pay | Admitting: Family Medicine

## 2015-09-24 NOTE — Telephone Encounter (Signed)
Please review these rx request. Unsure of the amount of refills for this patient.

## 2015-10-15 ENCOUNTER — Other Ambulatory Visit: Payer: Self-pay | Admitting: Family Medicine

## 2015-10-19 ENCOUNTER — Ambulatory Visit: Payer: Medicare PPO | Admitting: Family Medicine

## 2015-10-21 ENCOUNTER — Ambulatory Visit (INDEPENDENT_AMBULATORY_CARE_PROVIDER_SITE_OTHER): Payer: Medicare PPO | Admitting: Family Medicine

## 2015-10-21 ENCOUNTER — Encounter: Payer: Self-pay | Admitting: Family Medicine

## 2015-10-21 ENCOUNTER — Other Ambulatory Visit: Payer: Self-pay | Admitting: Family Medicine

## 2015-10-21 VITALS — BP 143/72 | HR 93 | Resp 16 | Wt 155.8 lb

## 2015-10-21 DIAGNOSIS — I739 Peripheral vascular disease, unspecified: Secondary | ICD-10-CM | POA: Diagnosis not present

## 2015-10-21 DIAGNOSIS — I5032 Chronic diastolic (congestive) heart failure: Secondary | ICD-10-CM

## 2015-10-21 DIAGNOSIS — N183 Chronic kidney disease, stage 3 unspecified: Secondary | ICD-10-CM

## 2015-10-21 DIAGNOSIS — K551 Chronic vascular disorders of intestine: Secondary | ICD-10-CM

## 2015-10-21 DIAGNOSIS — I482 Chronic atrial fibrillation, unspecified: Secondary | ICD-10-CM

## 2015-10-21 DIAGNOSIS — Z5181 Encounter for therapeutic drug level monitoring: Secondary | ICD-10-CM

## 2015-10-21 DIAGNOSIS — N184 Chronic kidney disease, stage 4 (severe): Secondary | ICD-10-CM

## 2015-10-21 DIAGNOSIS — F4321 Adjustment disorder with depressed mood: Secondary | ICD-10-CM | POA: Insufficient documentation

## 2015-10-21 DIAGNOSIS — Z634 Disappearance and death of family member: Secondary | ICD-10-CM

## 2015-10-21 DIAGNOSIS — M069 Rheumatoid arthritis, unspecified: Secondary | ICD-10-CM

## 2015-10-21 DIAGNOSIS — E1122 Type 2 diabetes mellitus with diabetic chronic kidney disease: Secondary | ICD-10-CM

## 2015-10-21 MED ORDER — ZOSTER VACCINE LIVE 19400 UNT/0.65ML ~~LOC~~ SUSR: 0.6500 mL | Freq: Once | SUBCUTANEOUS | Status: DC

## 2015-10-21 NOTE — Patient Instructions (Addendum)
Thank you for coming in today. Get labs today.  Continue medicines.  Get the shingles vaccine.  Return in 3 months.  Return sooner if needed.

## 2015-10-21 NOTE — Progress Notes (Signed)
[name] is a 82 y.o. female who presents to [name]: Primary Care Sports Medicine today for follow-up diabetes atrial fibrillation CHF vascular disease and discuss new acute grief reaction.  Grief: Patient's daughter recently died of bladder cancer. She is still grieving but feels as though she is doing reasonably well. She has not considered counseling nor medication. She has strong faith in God and feels that everything will be okay but will just take some time.  Atrial fibrillation: Doing quite well with the below medications. She takes warfarin as directed and the medicines for rate control daily. She denies any significant palpitations lightheadedness dizziness or chest pain.  Diabetes: Also doing well. No polyuria or polydipsia.  Kidney disease: Doing quite well. Patient has normal urine output.  Shingles vaccine: Patient would like a prescription for a shingles vaccine.   Past Medical History  Diagnosis Date  . Diabetes mellitus age 58  . Hypertension   . Arthritis   . Joint pain   . Atrial fibrillation (HCC)   . CHF (congestive heart failure) (HCC)   . Mesenteric ischemia   . Acute pancreatitis   . GI bleed   . Peripheral vascular disease (HCC)   . GERD (gastroesophageal reflux disease)   . Thyroid disease   . DJD (degenerative joint disease)   . CAD (coronary artery disease)   . DVT (deep venous thrombosis) (HCC)   . Anemia   . Carotid artery occlusion    Past Surgical History  Procedure Laterality Date  . Embolectomy  2009    right leg  . Celiac artery stent    . Fasciotomy      right leg  . Coronary artery bypass graft    . Total knee arthroplasty      bilateral  . Appendectomy    . Tubal ligation    . Joint replacement    . Visceral angiogram N/A 05/31/2013    Procedure: MESSENTERIC [name];  Surgeon: [name], MD;  Location: [facility] CATH LAB;   Service: Cardiovascular;  Laterality: N/A;  . Hip fracture surgery Left    Social History  Substance Use Topics  . Smoking status: Never Smoker   . Smokeless tobacco: Never Used  . Alcohol Use: No   family history includes Cancer in her daughter and mother; Coronary artery disease in her sister; Diabetes in her daughter, sister, and son; Heart disease in her sister; Hyperlipidemia in her son; Hypertension in her daughter and son; Other in her father and sister.  ROS as above:  Medications: Current Outpatient Prescriptions  Medication Sig Dispense Refill  . alendronate (FOSAMAX) 70 MG tablet Take by mouth.    Marland Kitchen aspirin 81 MG chewable tablet Chew by mouth.    Marland Kitchen aspirin 81 MG EC tablet TAKE ONE TABLET BY MOUTH EVERY DAY 28 tablet 2  . Calcium Carb-Cholecalciferol 600-800 MG-UNIT TABS TAKE ONE TABLET BY MOUTH 2 TIMES A DAY 60 tablet 6  . calcium carbonate (OS-CAL - DOSED IN MG OF ELEMENTAL CALCIUM) 1250 MG tablet Take 1 tablet by mouth daily with breakfast.    . cholecalciferol (VITAMIN D) 1000 UNITS tablet Take 2,000 Units by mouth daily.    . Cholecalciferol (VITAMIN D3) 2000 units capsule TAKE ONE CAPSULE BY MOUTH DAILY. (EVENING) 30 capsule 12  . clonazePAM (KLONOPIN) 0.5 MG tablet TAKE 1 TABLET BY MOUTH AT BEDTIME 30 tablet 4  . diltiazem (CARDIZEM CD) 240 MG 24 hr capsule TAKE ONE  CAPSULE BY MOUTH EVERY DAY 28 capsule 2  . Ergocalciferol (VITAMIN D2) 2000 units TABS Take by mouth.    . FERREX 150 150 MG capsule TAKE ONE CAPSULE BY MOUTH EVERY DAY 28 capsule 2  . glucose blood test strip Once daily E11.29 100 each 12  . leflunomide (ARAVA) 10 MG tablet TAKE 1 TABLET BY MOUTH AT BEDTIME 30 tablet 11  . levothyroxine (SYNTHROID, LEVOTHROID) 50 MCG tablet Take 50 mcg by mouth daily.      Marland Kitchen lisinopril (PRINIVIL,ZESTRIL) 20 MG tablet TAKE ONE TABLET BY MOUTH EVERY DAY 30 tablet 6  . metoprolol succinate (TOPROL-XL) 25 MG 24 hr tablet Take 1 tablet (25 mg total) by mouth daily. 90 tablet 3   . Multiple Vitamin (MULTIVITAMIN WITH MINERALS) TABS tablet Take 1 tablet by mouth daily.    . pantoprazole (PROTONIX) 40 MG tablet TAKE ONE TABLET BY MOUTH EVERY DAY 30 tablet 3  . pravastatin (PRAVACHOL) 40 MG tablet Take 40 mg by mouth daily.      . STOOL SOFTENER 100 MG capsule TAKE 1 CAPSULE BY MOUTH 2 TIMES A DAY (AM & PM) 56 capsule 0  . torsemide (DEMADEX) 100 MG tablet TAKE 1/2 TABLET BY MOUTH EVERY DAY 15 tablet 2  . traMADol (ULTRAM) 50 MG tablet 1 tablet twice daily and 1 tablet twice daily in addition as needed for pain. 120 tablet 5  . warfarin (COUMADIN) 3 MG tablet TAKE ONE TABLET BY MOUTH EVERY DAY 28 tablet 2   No current facility-administered medications for this visit.   Allergies  Allergen Reactions  . Cefuroxime Anaphylaxis    Throat swelling  . Cephalexin Rash     Exam:  BP 143/72 mmHg  Pulse 93  Resp 16  Wt 155 lb 12.8 oz (70.67 kg) Gen: Well NAD HEENT: EOMI,  MMM Lungs: Normal work of breathing. CTABL Heart: Irregularly irregular no MRG Abd: NABS, Soft. Nondistended, Nontender Exts: Brisk capillary refill, warm and well perfused. Significant rheumatoid deformity of hands bilaterally Site: Tearful affect. Normal speech thought process no SI or HI expressed.  Lab Results  Component Value Date   INR 2.1 07/22/2015   INR 1.7 07/14/2015   INR 1.76* 07/09/2015     No results found for this or any previous visit (from the past 24 hour(s)). No results found.    Assessment and Plan: 82 y.o. female with   Atrial Fibrillation and diastolic dysfunction: Good rate control and blood pressure today. Check INR. Recheck in 3 months or so  Diabetes: Also doing quite well. Check A1c today. Continue current regimen.  CKD: Doing well check labs  Grief: Obviously quite bothersome at this time. Plan for watchful waiting. Patient declined counseling and medications.  Anemia. Recheck anemia labs  Vitamin D deficiency: Recheck vitamin D  Vascular disease:  Continue current regimen.  Discussed warning signs or symptoms. Please see discharge instructions. Patient expresses understanding.

## 2015-10-22 LAB — HEMOGLOBIN A1C
HEMOGLOBIN A1C: 6.6 % — AB (ref <5.7)
MEAN PLASMA GLUCOSE: 143 mg/dL

## 2015-10-22 LAB — CBC
HEMATOCRIT: 35.9 % (ref 35.0–45.0)
HEMOGLOBIN: 11.2 g/dL — AB (ref 11.7–15.5)
MCH: 27.1 pg (ref 27.0–33.0)
MCHC: 31.2 g/dL — ABNORMAL LOW (ref 32.0–36.0)
MCV: 86.9 fL (ref 80.0–100.0)
MPV: 9.2 fL (ref 7.5–12.5)
Platelets: 342 10*3/uL (ref 140–400)
RBC: 4.13 MIL/uL (ref 3.80–5.10)
RDW: 16.8 % — AB (ref 11.0–15.0)
WBC: 8.2 10*3/uL (ref 3.8–10.8)

## 2015-10-22 LAB — COMPREHENSIVE METABOLIC PANEL
ALBUMIN: 3.7 g/dL (ref 3.6–5.1)
ALK PHOS: 88 U/L (ref 33–130)
ALT: 12 U/L (ref 6–29)
AST: 21 U/L (ref 10–35)
BILIRUBIN TOTAL: 0.4 mg/dL (ref 0.2–1.2)
BUN: 30 mg/dL — ABNORMAL HIGH (ref 7–25)
CALCIUM: 8.9 mg/dL (ref 8.6–10.4)
CO2: 27 mmol/L (ref 20–31)
Chloride: 97 mmol/L — ABNORMAL LOW (ref 98–110)
Creat: 1.38 mg/dL — ABNORMAL HIGH (ref 0.60–0.88)
GLUCOSE: 116 mg/dL — AB (ref 65–99)
POTASSIUM: 4.1 mmol/L (ref 3.5–5.3)
Sodium: 139 mmol/L (ref 135–146)
Total Protein: 6.9 g/dL (ref 6.1–8.1)

## 2015-10-22 LAB — IRON AND TIBC
%SAT: 15 % (ref 11–50)
Iron: 47 ug/dL (ref 45–160)
TIBC: 323 ug/dL (ref 250–450)
UIBC: 276 ug/dL (ref 125–400)

## 2015-10-22 LAB — FERRITIN: FERRITIN: 42 ng/mL (ref 20–288)

## 2015-10-22 LAB — PROTIME-INR
INR: 2.1 — AB
Prothrombin Time: 21.8 s — ABNORMAL HIGH (ref 9.0–11.5)

## 2015-10-22 LAB — VITAMIN D 25 HYDROXY (VIT D DEFICIENCY, FRACTURES): VIT D 25 HYDROXY: 92 ng/mL (ref 30–100)

## 2015-10-22 NOTE — Progress Notes (Signed)
Quick Note:    Labs look good.  ______

## 2015-11-16 ENCOUNTER — Other Ambulatory Visit: Payer: Self-pay | Admitting: Family Medicine

## 2015-11-17 ENCOUNTER — Other Ambulatory Visit: Payer: Self-pay | Admitting: Family Medicine

## 2015-12-16 ENCOUNTER — Other Ambulatory Visit: Payer: Self-pay | Admitting: Family Medicine

## 2015-12-29 ENCOUNTER — Ambulatory Visit (INDEPENDENT_AMBULATORY_CARE_PROVIDER_SITE_OTHER): Payer: Medicare PPO | Admitting: Family Medicine

## 2015-12-29 ENCOUNTER — Encounter: Payer: Self-pay | Admitting: Family Medicine

## 2015-12-29 VITALS — BP 137/72 | HR 77 | Temp 98.3°F | Ht 62.0 in | Wt 158.0 lb

## 2015-12-29 DIAGNOSIS — R300 Dysuria: Secondary | ICD-10-CM | POA: Diagnosis not present

## 2015-12-29 DIAGNOSIS — H1013 Acute atopic conjunctivitis, bilateral: Secondary | ICD-10-CM | POA: Diagnosis not present

## 2015-12-29 DIAGNOSIS — Z23 Encounter for immunization: Secondary | ICD-10-CM | POA: Diagnosis not present

## 2015-12-29 LAB — POCT URINALYSIS DIPSTICK
Bilirubin, UA: NEGATIVE
Blood, UA: NEGATIVE
Glucose, UA: NEGATIVE
Ketones, UA: NEGATIVE
LEUKOCYTES UA: NEGATIVE
NITRITE UA: NEGATIVE
PH UA: 6.5
PROTEIN UA: NEGATIVE
Spec Grav, UA: 1.015
UROBILINOGEN UA: 0.2

## 2015-12-29 NOTE — Progress Notes (Signed)
[name] is a 82 y.o. female who presents to [name]: Primary Care Sports Medicine today for urinary frequency and dysuria and itchy watery eyes.\  Urinary frequency: Patient has a one-day history of mild urinary frequency and discomfort. Symptoms are not consistent with UTI. Symptoms are mild. No fevers chills nausea vomiting or diarrhea. She denies any vaginal discharge or rash.  Itchy watery eyes: Patient notes a 3 week history of mild itchy watery eyes. She notes some runny nose and congestion as well and suspects allergies. She denies any blurry vision fevers chills nausea vomiting or diarrhea.  She  has not tried any medications for either problem.   Past Medical History:  Diagnosis Date  . Acute pancreatitis   . Anemia   . Arthritis   . Atrial fibrillation (HCC)   . CAD (coronary artery disease)   . Carotid artery occlusion   . CHF (congestive heart failure) (HCC)   . Diabetes mellitus age 63  . DJD (degenerative joint disease)   . DVT (deep venous thrombosis) (HCC)   . GERD (gastroesophageal reflux disease)   . GI bleed   . Hypertension   . Joint pain   . Mesenteric ischemia   . Peripheral vascular disease (HCC)   . Thyroid disease    Past Surgical History:  Procedure Laterality Date  . APPENDECTOMY    . CELIAC ARTERY STENT    . CORONARY ARTERY BYPASS GRAFT    . EMBOLECTOMY  2009   right leg  . FASCIOTOMY     right leg  . HIP FRACTURE SURGERY Left   . JOINT REPLACEMENT    . TOTAL KNEE ARTHROPLASTY     bilateral  . TUBAL LIGATION    . VISCERAL ANGIOGRAM N/A 05/31/2013   Procedure: MESSENTERIC [name];  Surgeon: [name], MD;  Location: [facility] CATH LAB;  Service: Cardiovascular;  Laterality: N/A;   Social History  Substance Use Topics  . Smoking status: Never Smoker  . Smokeless tobacco: Never Used  . Alcohol use No   family history includes Cancer  in her daughter and mother; Coronary artery disease in her sister; Diabetes in her daughter, sister, and son; Heart disease in her sister; Hyperlipidemia in her son; Hypertension in her daughter and son; Other in her father and sister.  ROS as above:  Medications: Current Outpatient Prescriptions  Medication Sig Dispense Refill  . alendronate (FOSAMAX) 70 MG tablet Take by mouth.    Marland Kitchen aspirin 81 MG chewable tablet Chew by mouth.    Marland Kitchen aspirin 81 MG EC tablet TAKE ONE TABLET BY MOUTH EVERY DAY 28 tablet 2  . Calcium Carb-Cholecalciferol 600-800 MG-UNIT TABS TAKE ONE TABLET BY MOUTH 2 TIMES A DAY 60 tablet 6  . calcium carbonate (OS-CAL - DOSED IN MG OF ELEMENTAL CALCIUM) 1250 MG tablet Take 1 tablet by mouth daily with breakfast.    . cholecalciferol (VITAMIN D) 1000 UNITS tablet Take 2,000 Units by mouth daily.    . Cholecalciferol (VITAMIN D3) 2000 units capsule TAKE ONE CAPSULE BY MOUTH DAILY. (EVENING) 30 capsule 12  . clonazePAM (KLONOPIN) 0.5 MG tablet TAKE 1 TABLET BY MOUTH AT BEDTIME 28 tablet 4  . diltiazem (CARDIZEM CD) 240 MG 24 hr capsule TAKE 1 CAPSULE BY MOUTH AT BEDTIME 30 capsule 11  . Ergocalciferol (VITAMIN D2) 2000 units TABS Take by mouth.    . FERREX 150 150 MG capsule TAKE ONE CAPSULE BY MOUTH EVERY DAY 28  capsule 2  . glucose blood test strip Once daily E11.29 100 each 12  . leflunomide (ARAVA) 10 MG tablet TAKE 1 TABLET BY MOUTH AT BEDTIME 30 tablet 11  . levothyroxine (SYNTHROID, LEVOTHROID) 50 MCG tablet Take 50 mcg by mouth daily.      Marland Kitchen lisinopril (PRINIVIL,ZESTRIL) 20 MG tablet TAKE ONE TABLET BY MOUTH EVERY DAY 30 tablet 6  . metoprolol succinate (TOPROL-XL) 25 MG 24 hr tablet Take 1 tablet (25 mg total) by mouth daily. 90 tablet 3  . Multiple Vitamin (MULTIVITAMIN WITH MINERALS) TABS tablet Take 1 tablet by mouth daily.    . pantoprazole (PROTONIX) 40 MG tablet TAKE 1 TABLET BY MOUTH ONCE DAILY 30 tablet 2  . pravastatin (PRAVACHOL) 40 MG tablet Take 40 mg by  mouth daily.      . STOOL SOFTENER 100 MG capsule TAKE 1 CAPSULE BY MOUTH 2 TIMES A DAY (AM & PM) 56 capsule 0  . torsemide (DEMADEX) 100 MG tablet TAKE 1/2 TABLET BY MOUTH ONCE DAILY 14 tablet 11  . traMADol (ULTRAM) 50 MG tablet 1 tablet twice daily and 1 tablet twice daily in addition as needed for pain. 120 tablet 5  . warfarin (COUMADIN) 3 MG tablet TAKE 1 TABLET BY MOUTH ONCE A DAY 28 tablet 11  . Zoster Vaccine Live, PF, (ZOSTAVAX) 24097 UNT/0.65ML injection Inject 19,400 Units into the skin once. If given in pharmacy fax report to Dr Denyse Amass [phone] 1 each 0   No current facility-administered medications for this visit.    Allergies  Allergen Reactions  . Cefuroxime Anaphylaxis    Throat swelling  . Cephalexin Rash     Exam:  BP 137/72   Pulse 77   Temp 98.3 F (36.8 C) (Oral)   Ht 5\' 2"  (1.575 m)   Wt 158 lb (71.7 kg)   SpO2 96%   BMI 28.90 kg/m  Gen: Well NAD HEENT: EOMI,  MMM Normal appearing conjunctiva. No discharge or erythema noted. Lungs: Normal work of breathing. CTABL Heart: Irregular irregular normal rate no MRG Abd: NABS, Soft. Nondistended, Nontender no CVA angle tenderness to percussion Exts: Brisk capillary refill, warm and well perfused.   Point of care urinalysis is negative.     Assessment and Plan: 82 y.o. female with  Dysuria: Unclear etiology. Culture pending. Hold off on treatment at this time.  Allergic conjunctivitis likely explains her eye symptoms. Recommend antihistamine eyedrops as needed.  Atrial fibrillation: Labs updated. Recheck in 3 months or less.  Tdap given prior to discharge. Patient declined influenza vaccine.    Orders Placed This Encounter  Procedures  . Urine culture  . Tdap vaccine greater than or equal to 7yo IM  . POCT Urinalysis Dipstick    Discussed warning signs or symptoms. Please see discharge instructions. Patient expresses understanding.

## 2015-12-29 NOTE — Patient Instructions (Addendum)
Thank you for coming in today. Use over-the-counter Zaditor eyedrops (Ketotifen) Use over-the-counter Zyrtec (cetirizine)  Use Systane artificial tears as needed  Continue current treatment.  Return in 3 months.    Allergic Conjunctivitis Allergic conjunctivitis is inflammation of the clear membrane that covers the white part of your eye and the inner surface of your eyelid (conjunctiva), and it is caused by allergies. The blood vessels in the conjunctiva become inflamed, and this causes the eye to become red or pink, and it often causes itchiness in the eye. Allergic conjunctivitis cannot be spread by one person to another person (noncontagious). CAUSES This condition is caused by an allergic reaction. Common causes of an allergic reaction (allergens) include:  Dust.  Pollen.  Mold.  Animal dander or secretions. RISK FACTORS This condition is more likely to develop if you are exposed to high levels of allergens that cause the allergic reaction. This might include being outdoors when air pollen levels are high or being around animals that you are allergic to. SYMPTOMS Symptoms of this condition may include:  Eye redness.  Tearing of the eyes.  Watery eyes.  Itchy eyes.  Burning feeling in the eyes.  Clear drainage from the eyes.  Swollen eyelids. DIAGNOSIS This condition may be diagnosed by medical history and physical exam. If you have drainage from your eyes, it may be tested to rule out other causes of conjunctivitis. TREATMENT Treatment for this condition often includes medicines. These may be eye drops, ointments, or oral medicines. They may be prescription medicines or over-the-counter medicines. HOME CARE INSTRUCTIONS  Take or apply medicines only as directed by your health care provider.  Do not touch or rub your eyes.  Do not wear contact lenses until the inflammation is gone. Wear glasses instead.  Do not wear eye makeup until the inflammation is  gone.  Apply a cool, clean washcloth to your eye for 10-20 minutes, 3-4 times a day.  Try to avoid whatever allergen is causing the allergic reaction. SEEK MEDICAL CARE IF:  Your symptoms get worse.  You have pus draining from your eye.  You have new symptoms.  You have a fever.   This information is not intended to replace advice given to you by your health care provider. Make sure you discuss any questions you have with your health care provider.   Document Released: 06/18/2002 Document Revised: 04/18/2014 Document Reviewed: 01/07/2014 Elsevier Interactive Patient Education [employer]

## 2015-12-31 LAB — URINE CULTURE

## 2016-01-12 ENCOUNTER — Other Ambulatory Visit: Payer: Self-pay | Admitting: Family Medicine

## 2016-01-14 ENCOUNTER — Other Ambulatory Visit: Payer: Self-pay

## 2016-01-14 MED ORDER — PRAVASTATIN SODIUM 40 MG PO TABS: 40.0000 mg | ORAL_TABLET | Freq: Every day | ORAL | 0 refills | Status: DC

## 2016-01-27 ENCOUNTER — Ambulatory Visit: Payer: Medicare PPO | Admitting: Family Medicine

## 2016-02-08 ENCOUNTER — Other Ambulatory Visit: Payer: Self-pay | Admitting: Family Medicine

## 2016-02-11 ENCOUNTER — Other Ambulatory Visit: Payer: Self-pay | Admitting: Family Medicine

## 2016-02-18 ENCOUNTER — Ambulatory Visit (INDEPENDENT_AMBULATORY_CARE_PROVIDER_SITE_OTHER): Payer: Medicare PPO | Admitting: Family Medicine

## 2016-02-18 ENCOUNTER — Encounter: Payer: Self-pay | Admitting: Family Medicine

## 2016-02-18 VITALS — BP 125/68 | HR 96 | Temp 98.5°F | Wt 155.0 lb

## 2016-02-18 DIAGNOSIS — R300 Dysuria: Secondary | ICD-10-CM | POA: Diagnosis not present

## 2016-02-18 LAB — POCT URINALYSIS DIPSTICK
BILIRUBIN UA: NEGATIVE
Blood, UA: NEGATIVE
Glucose, UA: NEGATIVE
KETONES UA: NEGATIVE
Nitrite, UA: NEGATIVE
Protein, UA: NEGATIVE
Spec Grav, UA: 1.01
Urobilinogen, UA: 0.2
pH, UA: 6.5

## 2016-02-18 MED ORDER — NITROFURANTOIN MACROCRYSTAL 100 MG PO CAPS: 100.0000 mg | ORAL_CAPSULE | Freq: Two times a day (BID) | ORAL | 0 refills | Status: DC

## 2016-02-18 NOTE — Progress Notes (Signed)
   Subjective:    Patient ID: [name], female    DOB: 1929-06-19, 82 y.o.   MRN: [id number]  HPI 82 year old female with a history of rheumatoid arthritis and diabetes. comes in today complaining of some urinary frequency and burning that started yesterday. She's having some pelvic discomfort as well. She denies any recent hospitalizations or catheterizations. She says it's been years since she's had a urinary tract infection. She has been drinking water and cranberry juice since her symptoms started yesterday.  Lab Results  Component Value Date   HGBA1C 6.6 (H) 10/21/2015     Review of Systems     Objective:   Physical Exam  Constitutional: She is oriented to person, place, and time. She appears well-developed and well-nourished.  HENT:  Head: Normocephalic and atraumatic.  Eyes: Conjunctivae are normal.  Cardiovascular: Normal rate and normal heart sounds.   Rhythm is irregular  Pulmonary/Chest: Effort normal and breath sounds normal.  Abdominal: Soft. Bowel sounds are normal. She exhibits no distension. There is no tenderness.  Neurological: She is alert and oriented to person, place, and time.  Skin: Skin is warm and dry.  Psychiatric: She has a normal mood and affect. Her behavior is normal.       Assessment & Plan:   urinary tract infection -  was positive only for small amount of leukocytes. We'll send for culture. Enzymes are consistent. We'll go ahead and treat with nitrofurantoin for a short course for 5 days because of her other medications. Call if not better in one week. Will send for culture for confirmation.

## 2016-02-18 NOTE — Patient Instructions (Signed)

## 2016-02-18 NOTE — Addendum Note (Signed)
Addended by: Deno Etienne on: 02/18/2016 04:56 PM   Modules accepted: Orders

## 2016-02-21 LAB — URINE CULTURE

## 2016-02-24 ENCOUNTER — Ambulatory Visit (INDEPENDENT_AMBULATORY_CARE_PROVIDER_SITE_OTHER): Payer: Medicare PPO | Admitting: Family Medicine

## 2016-02-24 VITALS — BP 105/67 | HR 98 | Temp 97.7°F

## 2016-02-24 DIAGNOSIS — R300 Dysuria: Secondary | ICD-10-CM | POA: Diagnosis not present

## 2016-02-24 NOTE — Progress Notes (Signed)
Patient came into clinic today to give new urine specimin. Pt reports she is still having some dysuria and increased frequency. Pt was educated on how to properly wipe prior to giving collection. Pt was able to provide urine sample in clinic today. Advised we will call her with results. Per PCP, urine culture only ordered.

## 2016-02-24 NOTE — Progress Notes (Signed)
Agree with above.  Kenetha Cozza, MD  

## 2016-02-26 LAB — URINE CULTURE

## 2016-03-01 ENCOUNTER — Other Ambulatory Visit: Payer: Self-pay | Admitting: Family Medicine

## 2016-03-29 ENCOUNTER — Ambulatory Visit (INDEPENDENT_AMBULATORY_CARE_PROVIDER_SITE_OTHER): Payer: Medicare PPO | Admitting: Family Medicine

## 2016-03-29 VITALS — BP 120/59 | HR 79 | Wt 157.0 lb

## 2016-03-29 DIAGNOSIS — R300 Dysuria: Secondary | ICD-10-CM | POA: Diagnosis not present

## 2016-03-29 DIAGNOSIS — N183 Chronic kidney disease, stage 3 (moderate): Secondary | ICD-10-CM

## 2016-03-29 DIAGNOSIS — I482 Chronic atrial fibrillation, unspecified: Secondary | ICD-10-CM

## 2016-03-29 DIAGNOSIS — M069 Rheumatoid arthritis, unspecified: Secondary | ICD-10-CM | POA: Diagnosis not present

## 2016-03-29 DIAGNOSIS — K551 Chronic vascular disorders of intestine: Secondary | ICD-10-CM

## 2016-03-29 DIAGNOSIS — E1122 Type 2 diabetes mellitus with diabetic chronic kidney disease: Secondary | ICD-10-CM

## 2016-03-29 DIAGNOSIS — E039 Hypothyroidism, unspecified: Secondary | ICD-10-CM

## 2016-03-29 DIAGNOSIS — Z7901 Long term (current) use of anticoagulants: Secondary | ICD-10-CM | POA: Diagnosis not present

## 2016-03-29 DIAGNOSIS — N184 Chronic kidney disease, stage 4 (severe): Secondary | ICD-10-CM

## 2016-03-29 LAB — POCT URINALYSIS DIPSTICK
BILIRUBIN UA: NEGATIVE
Glucose, UA: NEGATIVE
KETONES UA: NEGATIVE
Nitrite, UA: NEGATIVE
PH UA: 6
PROTEIN UA: NEGATIVE
RBC UA: NEGATIVE
Spec Grav, UA: 1.01
Urobilinogen, UA: 0.2

## 2016-03-29 LAB — POCT INR: INR: 3.2

## 2016-03-29 MED ORDER — PREDNISONE 5 MG PO TABS: 5.0000 mg | ORAL_TABLET | Freq: Every day | ORAL | 1 refills | Status: DC | PRN

## 2016-03-29 NOTE — Progress Notes (Signed)
[name] is a 82 y.o. female who presents to [facility] [name]: Primary Care Sports Medicine today for follow-up diabetes, dysuria, atrial fibrillation, rheumatoid arthritis.  Diabetes: Doing well with diet control. A1c typically has been around 6.6. She notes her sugars are typically in the 120s. Her diabetes is complicated by a diabetic wound ulcer that is currently being managed by podiatry. Overall she feels quite well with no significant polyuria or polydipsia.  Dysuria: Patient has a history of frequent urinary tract infections. She's had several urine cultures of been unremarkable. She has been treated with antibiotics recently. Overall she's feeling quite better with significantly improved dysuria symptoms.  Atrial Fibulation with anticoagulation: Doing well. No chest pain or significant palpitations.  Rheumatoid arthritis: Patient is a history of significant rheumatoid arthritis. She takes the medications listed below. Occasionally she'll use a short course of 5 or 10 mg of prednisone for a few days daily to treat a flare. She notes that she has run out of her prednisone and would like a refill if possible. She hardly ever has to use the prednisone.   Past Medical History:  Diagnosis Date  . Acute pancreatitis   . Anemia   . Arthritis   . Atrial fibrillation (HCC)   . CAD (coronary artery disease)   . Carotid artery occlusion   . CHF (congestive heart failure) (HCC)   . Diabetes mellitus age 29  . DJD (degenerative joint disease)   . DVT (deep venous thrombosis) (HCC)   . GERD (gastroesophageal reflux disease)   . GI bleed   . Hypertension   . Joint pain   . Mesenteric ischemia   . Peripheral vascular disease (HCC)   . Thyroid disease    Past Surgical History:  Procedure Laterality Date  . APPENDECTOMY    . CELIAC ARTERY STENT    . CORONARY ARTERY BYPASS GRAFT    . EMBOLECTOMY   2009   right leg  . FASCIOTOMY     right leg  . HIP FRACTURE SURGERY Left   . JOINT REPLACEMENT    . TOTAL KNEE ARTHROPLASTY     bilateral  . TUBAL LIGATION    . VISCERAL ANGIOGRAM N/A 05/31/2013   Procedure: MESSENTERIC [name];  Surgeon: [name], MD;  Location: [facility] CATH LAB;  Service: Cardiovascular;  Laterality: N/A;   Social History  Substance Use Topics  . Smoking status: Never Smoker  . Smokeless tobacco: Never Used  . Alcohol use No   family history includes Cancer in her daughter and mother; Coronary artery disease in her sister; Diabetes in her daughter, sister, and son; Heart disease in her sister; Hyperlipidemia in her son; Hypertension in her daughter and son; Other in her father and sister.  ROS as above:  Medications: Current Outpatient Prescriptions  Medication Sig Dispense Refill  . alendronate (FOSAMAX) 70 MG tablet Take by mouth.    Marland Kitchen aspirin 81 MG chewable tablet Chew by mouth.    Marland Kitchen aspirin 81 MG EC tablet TAKE ONE TABLET BY MOUTH EVERY DAY 28 tablet 2  . Calcium Carb-Cholecalciferol 600-800 MG-UNIT TABS TAKE ONE TABLET BY MOUTH 2 TIMES A DAY 60 tablet 6  . calcium carbonate (OS-CAL - DOSED IN MG OF ELEMENTAL CALCIUM) 1250 MG tablet Take 1 tablet by mouth daily with breakfast.    . cholecalciferol (VITAMIN D) 1000 UNITS tablet Take 2,000 Units by mouth daily.    . Cholecalciferol (VITAMIN D3) 2000 units capsule  TAKE ONE CAPSULE BY MOUTH DAILY. (EVENING) 30 capsule 12  . clonazePAM (KLONOPIN) 0.5 MG tablet TAKE 1 TABLET BY MOUTH AT BEDTIME 30 tablet 2  . diltiazem (CARDIZEM CD) 240 MG 24 hr capsule TAKE 1 CAPSULE BY MOUTH AT BEDTIME 30 capsule 11  . Ergocalciferol (VITAMIN D2) 2000 units TABS Take by mouth.    . FERREX 150 150 MG capsule TAKE ONE CAPSULE BY MOUTH EVERY DAY 28 capsule 2  . glucose blood test strip Once daily E11.29 100 each 12  . leflunomide (ARAVA) 10 MG tablet TAKE 1 TABLET BY MOUTH AT BEDTIME 30 tablet 11  . levothyroxine  (SYNTHROID, LEVOTHROID) 50 MCG tablet Take 50 mcg by mouth daily.      Marland Kitchen lisinopril (PRINIVIL,ZESTRIL) 20 MG tablet TAKE 1 TABLET BY MOUTH ONCE DAILY 28 tablet 10  . metoprolol succinate (TOPROL-XL) 25 MG 24 hr tablet Take 1 tablet (25 mg total) by mouth daily. 90 tablet 3  . Multiple Vitamin (MULTIVITAMIN WITH MINERALS) TABS tablet Take 1 tablet by mouth daily.    . pantoprazole (PROTONIX) 40 MG tablet TAKE 1 TABLET BY MOUTH ONCE DAILY 28 tablet 10  . pravastatin (PRAVACHOL) 40 MG tablet Take 1 tablet (40 mg total) by mouth daily. 90 tablet 0  . predniSONE (DELTASONE) 5 MG tablet Take 1-2 tablets (5-10 mg total) by mouth daily as needed (Rheumatoid flair). 30 tablet 1  . STOOL SOFTENER 100 MG capsule TAKE 1 CAPSULE BY MOUTH 2 TIMES A DAY (AM & PM) 56 capsule 0  . torsemide (DEMADEX) 100 MG tablet TAKE 1/2 TABLET BY MOUTH ONCE DAILY 14 tablet 11  . traMADol (ULTRAM) 50 MG tablet TAKE 1 TABLET BY MOUTH TWICE A DAY TAKE 1 TABLET BY MOUTH TWICE A DAYAS NEEDED FOR PAIN 112 tablet 4  . warfarin (COUMADIN) 3 MG tablet TAKE 1 TABLET BY MOUTH ONCE A DAY 28 tablet 11   No current facility-administered medications for this visit.    Allergies  Allergen Reactions  . Cefuroxime Anaphylaxis    Throat swelling  . Cephalexin Rash    Health Maintenance Health Maintenance  Topic Date Due  . FOOT EXAM  12/24/2015  . INFLUENZA VACCINE  12/29/2016 (Originally 11/10/2015)  . HEMOGLOBIN A1C  04/22/2016  . OPHTHALMOLOGY EXAM  07/13/2016  . TETANUS/TDAP  12/28/2025  . ZOSTAVAX  Completed  . PNA vac Low Risk Adult  Completed     Exam:  BP (!) 120/59   Pulse 79   Wt 157 lb (71.2 kg)   SpO2 97%   BMI 28.72 kg/m  Gen: Well NAD HEENT: EOMI,  MMM Lungs: Normal work of breathing. CTABL Heart: Irregularly irregular normal rate no MRG Abd: NABS, Soft. Nondistended, Nontender Exts: Brisk capillary refill, warm and well perfused. Significant MCP ulnar deviation and bilaterally   Results for orders placed  or performed in visit on 03/29/16 (from the past 72 hour(s))  POCT INR     Status: None   Collection Time: 03/29/16  2:37 PM  Result Value Ref Range   INR 3.2   POCT Urinalysis Dipstick     Status: Abnormal   Collection Time: 03/29/16  2:47 PM  Result Value Ref Range   Color, UA yellow    Clarity, UA clear    Glucose, UA neg    Bilirubin, UA neg    Ketones, UA neg    Spec Grav, UA 1.010    Blood, UA neg    pH, UA 6.0    Protein,  UA neg    Urobilinogen, UA 0.2    Nitrite, UA neg    Leukocytes, UA moderate (2+) (A) Negative   No results found.    Assessment and Plan: 82 y.o. female with  Diabetes: Typically doing pretty well. We'll check A1c today. Continue current regimen.  Dysuria: Urine culture pending. Continue current regimen will call in antibiotics appropriately as patient is only mildly symptomatic.  Atrial fibrillation: Rate controlled today. Continue anticoagulation. No changes to warfarin dose as typical INR with her current doses at goal. Recheck in the near future.  Rheumatoid arthritis: We will use prednisone for short. Time as needed for flare. Return as needed.  Recheck in 3-6 months.   Orders Placed This Encounter  Procedures  . Urine Culture  . CBC  . COMPLETE METABOLIC PANEL WITH GFR  . TSH  . Hemoglobin A1c  . POCT INR  . POCT Urinalysis Dipstick    Discussed warning signs or symptoms. Please see discharge instructions. Patient expresses understanding.

## 2016-03-29 NOTE — Patient Instructions (Signed)
Thank you for coming in today. Return every 3-6 months for recheck.  Get labs today.  Return sooner if needed.   Call or go to the emergency room if you get worse, have trouble breathing, have chest pains, or palpitations.

## 2016-03-30 ENCOUNTER — Other Ambulatory Visit: Payer: Self-pay

## 2016-03-30 ENCOUNTER — Other Ambulatory Visit: Payer: Self-pay | Admitting: Family Medicine

## 2016-03-30 LAB — COMPLETE METABOLIC PANEL WITH GFR
ALT: 18 U/L (ref 6–29)
AST: 32 U/L (ref 10–35)
Albumin: 3.4 g/dL — ABNORMAL LOW (ref 3.6–5.1)
Alkaline Phosphatase: 77 U/L (ref 33–130)
BUN: 35 mg/dL — ABNORMAL HIGH (ref 7–25)
CO2: 26 mmol/L (ref 20–31)
Calcium: 8.9 mg/dL (ref 8.6–10.4)
Chloride: 101 mmol/L (ref 98–110)
Creat: 1.64 mg/dL — ABNORMAL HIGH (ref 0.60–0.88)
GFR, Est African American: 33 mL/min — ABNORMAL LOW (ref >=60)
GFR, Est Non African American: 29 mL/min — ABNORMAL LOW (ref >=60)
Glucose, Bld: 104 mg/dL — ABNORMAL HIGH (ref 65–99)
Potassium: 4.8 mmol/L (ref 3.5–5.3)
Sodium: 138 mmol/L (ref 135–146)
Total Bilirubin: 0.4 mg/dL (ref 0.2–1.2)
Total Protein: 6.7 g/dL (ref 6.1–8.1)

## 2016-03-30 LAB — HEMOGLOBIN A1C
Hgb A1c MFr Bld: 6.8 % — ABNORMAL HIGH (ref <5.7)
Mean Plasma Glucose: 148 mg/dL

## 2016-03-30 LAB — CBC
HCT: 33 % — ABNORMAL LOW (ref 35.0–45.0)
HEMOGLOBIN: 10.4 g/dL — AB (ref 11.7–15.5)
MCH: 26.9 pg — AB (ref 27.0–33.0)
MCHC: 31.5 g/dL — ABNORMAL LOW (ref 32.0–36.0)
MCV: 85.5 fL (ref 80.0–100.0)
MPV: 9.4 fL (ref 7.5–12.5)
Platelets: 383 10*3/uL (ref 140–400)
RBC: 3.86 MIL/uL (ref 3.80–5.10)
RDW: 16.8 % — ABNORMAL HIGH (ref 11.0–15.0)
WBC: 8.4 10*3/uL (ref 3.8–10.8)

## 2016-03-30 LAB — TSH: TSH: 1.67 mIU/L

## 2016-03-30 MED ORDER — PRAVASTATIN SODIUM 40 MG PO TABS: 40.0000 mg | ORAL_TABLET | Freq: Every day | ORAL | 0 refills | Status: DC

## 2016-03-31 MED ORDER — CIPROFLOXACIN HCL 250 MG PO TABS: 250.0000 mg | ORAL_TABLET | Freq: Two times a day (BID) | ORAL | 0 refills | Status: DC

## 2016-03-31 NOTE — Addendum Note (Signed)
Addended by: Rodolph Bong on: 03/31/2016 07:05 AM   Modules accepted: Orders

## 2016-04-01 LAB — URINE CULTURE

## 2016-04-15 ENCOUNTER — Other Ambulatory Visit: Payer: Self-pay | Admitting: Family Medicine

## 2016-04-15 DIAGNOSIS — I482 Chronic atrial fibrillation, unspecified: Secondary | ICD-10-CM

## 2016-04-26 ENCOUNTER — Other Ambulatory Visit: Payer: Self-pay | Admitting: Family Medicine

## 2016-05-17 ENCOUNTER — Ambulatory Visit (INDEPENDENT_AMBULATORY_CARE_PROVIDER_SITE_OTHER): Payer: Medicare PPO | Admitting: Family Medicine

## 2016-05-17 ENCOUNTER — Encounter: Payer: Self-pay | Admitting: Family Medicine

## 2016-05-17 ENCOUNTER — Ambulatory Visit (INDEPENDENT_AMBULATORY_CARE_PROVIDER_SITE_OTHER): Payer: Medicare PPO

## 2016-05-17 VITALS — BP 132/51 | HR 66 | Temp 98.4°F | Wt 156.0 lb

## 2016-05-17 DIAGNOSIS — R791 Abnormal coagulation profile: Secondary | ICD-10-CM

## 2016-05-17 DIAGNOSIS — I517 Cardiomegaly: Secondary | ICD-10-CM

## 2016-05-17 DIAGNOSIS — J449 Chronic obstructive pulmonary disease, unspecified: Secondary | ICD-10-CM | POA: Diagnosis not present

## 2016-05-17 DIAGNOSIS — R059 Cough, unspecified: Secondary | ICD-10-CM

## 2016-05-17 DIAGNOSIS — R05 Cough: Secondary | ICD-10-CM

## 2016-05-17 DIAGNOSIS — I4891 Unspecified atrial fibrillation: Secondary | ICD-10-CM

## 2016-05-17 LAB — POCT INR: INR: 5.6

## 2016-05-17 IMAGING — DX DG CHEST 2V
2 series · 2 of 2 positions shown · non-contrast
Comparison: [DATE]

CLINICAL DATA: Cough for 1 week

EXAM:
CHEST  2 VIEW

[chest pa]
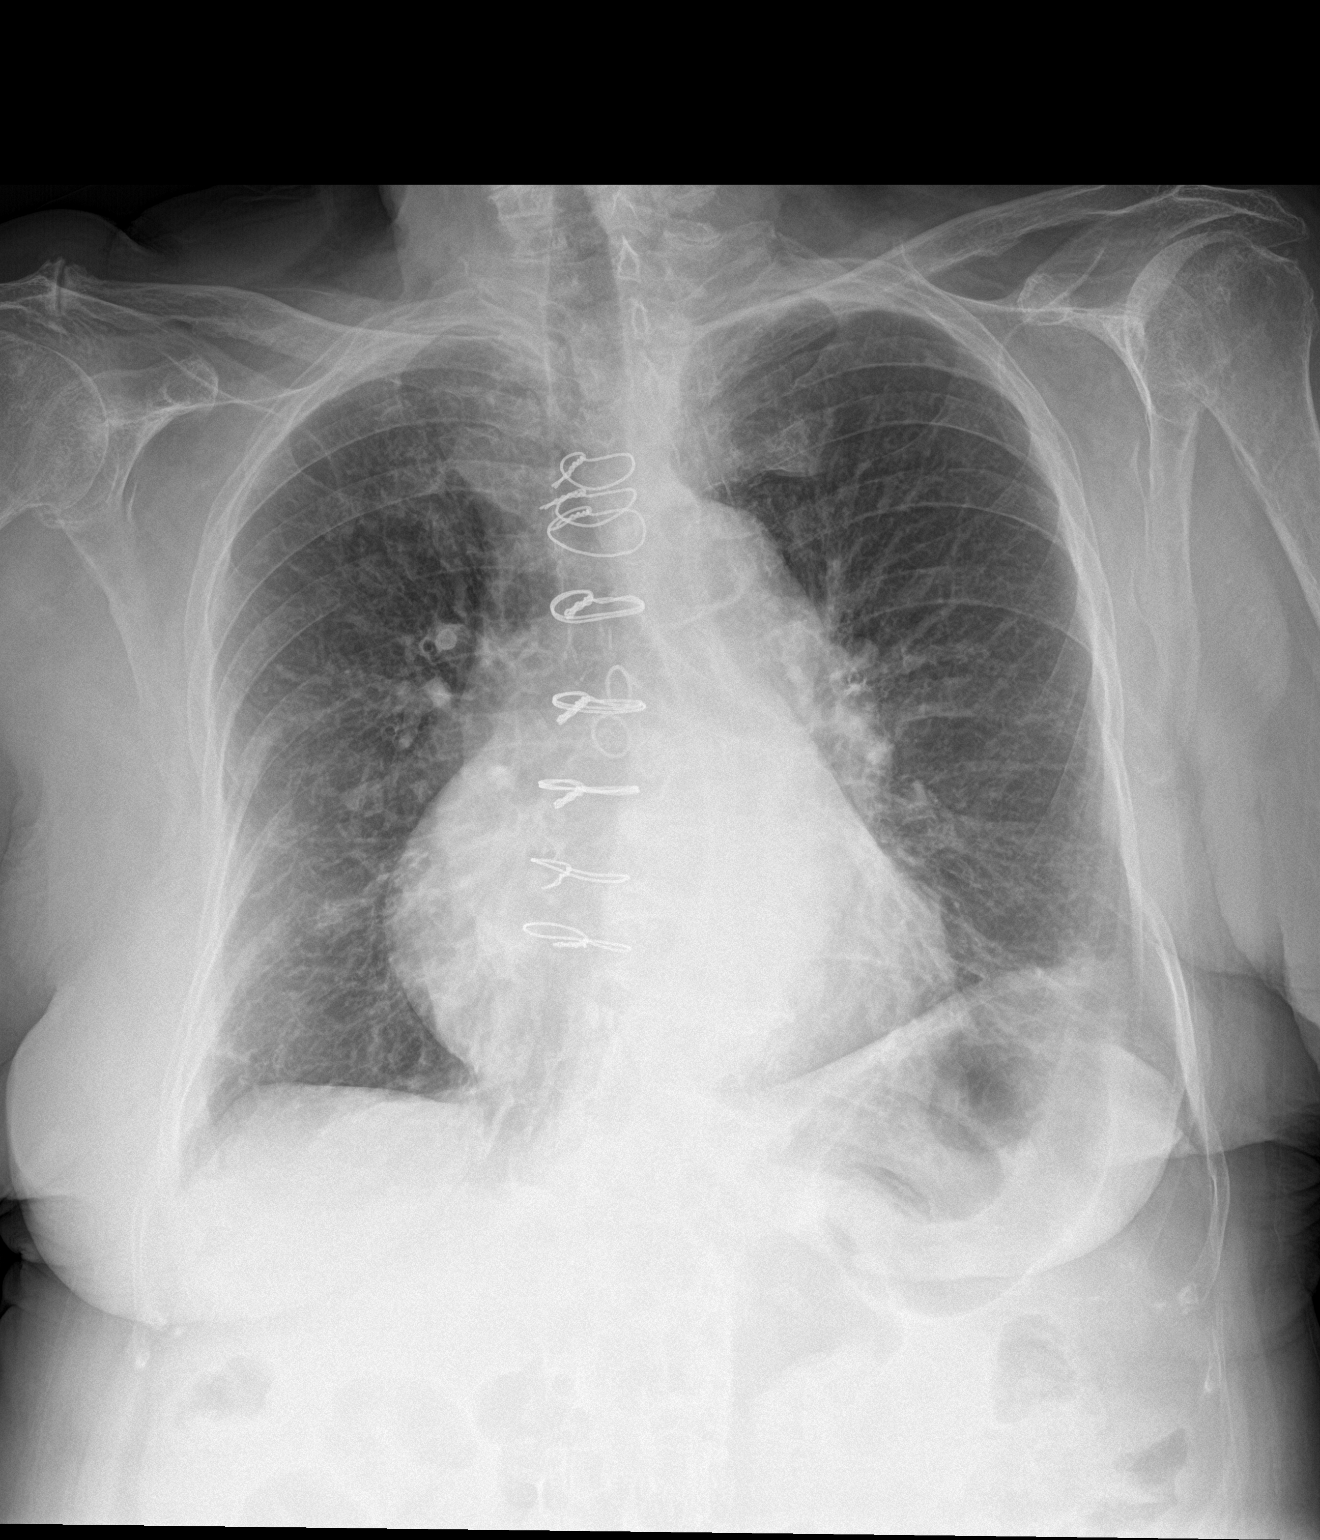

[chest lat]
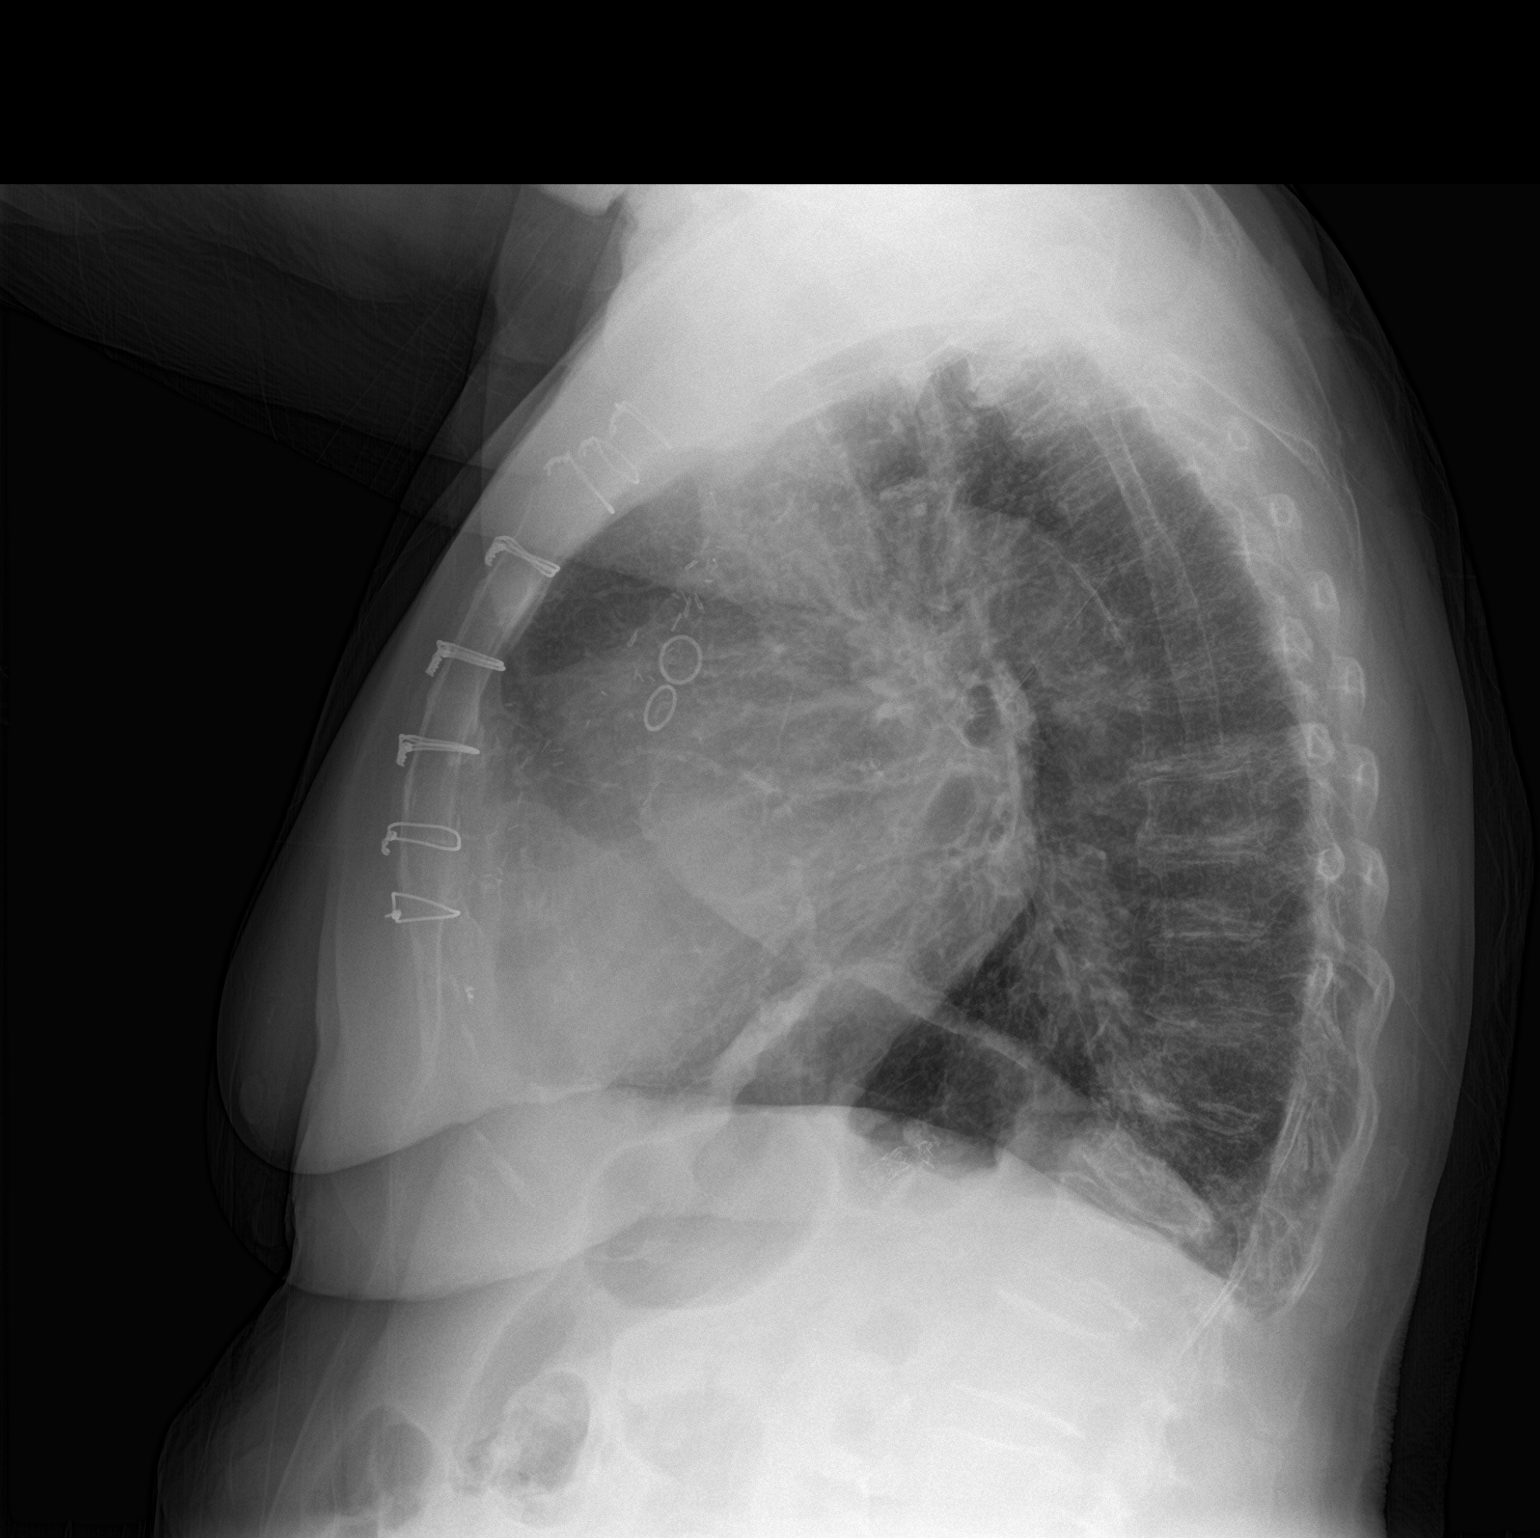

[2 of 2 positions shown; findings below may reference images not displayed]

FINDINGS: There is hyperinflation of the lungs compatible with COPD.
Cardiomegaly. Prior CABG. No confluent opacities, effusions or
edema. No acute bony abnormality.
IMPRESSION: Cardiomegaly.  COPD.  No active disease.

## 2016-05-17 MED ORDER — BENZONATATE 200 MG PO CAPS
200.0000 mg | ORAL_CAPSULE | Freq: Three times a day (TID) | ORAL | 0 refills | Status: DC | PRN
Start: 2016-05-17 — End: 2016-08-03

## 2016-05-17 MED ORDER — PREDNISONE 20 MG PO TABS: 20.0000 mg | ORAL_TABLET | Freq: Every day | ORAL | 0 refills | Status: DC

## 2016-05-17 MED ORDER — GUAIFENESIN-CODEINE 100-10 MG/5ML PO SOLN: 5.0000 mL | Freq: Every evening | ORAL | 0 refills | Status: DC | PRN

## 2016-05-17 NOTE — Patient Instructions (Signed)
Thank you for coming in today. Get xray of the lungs today.  Take 20mg  of prednisone daily for 5 days.  Use the cough medicine as needed.  Call or go to the emergency room if you get worse, have trouble breathing, have chest pains, or palpitations.    Cough, Adult Introduction A cough helps to clear your throat and lungs. A cough may last only 2-3 weeks (acute), or it may last longer than 8 weeks (chronic). Many different things can cause a cough. A cough may be a sign of an illness or another medical condition. Follow these instructions at home:  Pay attention to any changes in your cough.  Take medicines only as told by your doctor.  If you were prescribed an antibiotic medicine, take it as told by your doctor. Do not stop taking it even if you start to feel better.  Talk with your doctor before you try using a cough medicine.  Drink enough fluid to keep your pee (urine) clear or pale yellow.  If the air is dry, use a cold steam vaporizer or humidifier in your home.  Stay away from things that make you cough at work or at home.  If your cough is worse at night, try using extra pillows to raise your head up higher while you sleep.  Do not smoke, and try not to be around smoke. If you need help quitting, ask your doctor.  Do not have caffeine.  Do not drink alcohol.  Rest as needed. Contact a doctor if:  You have new problems (symptoms).  You cough up yellow fluid (pus).  Your cough does not get better after 2-3 weeks, or your cough gets worse.  Medicine does not help your cough and you are not sleeping well.  You have pain that gets worse or pain that is not helped with medicine.  You have a fever.  You are losing weight and you do not know why.  You have night sweats. Get help right away if:  You cough up blood.  You have trouble breathing.  Your heartbeat is very fast. This information is not intended to replace advice given to you by your health care  provider. Make sure you discuss any questions you have with your health care provider. Document Released: 12/09/2010 Document Revised: 09/03/2015 Document Reviewed: 06/04/2014  2017 Elsevier

## 2016-05-17 NOTE — Progress Notes (Signed)
[name] is a 83 y.o. female who presents to [facility] [name]: Primary Care Sports Medicine today for cough. Patient notes a one-week history of cough. Initially she had sore throat wheezing and shortness of breath associated with a runny nose. The wheezing shortness of breath and sore throat has significantly improved. She notes that she's been taking 5 mg of prednisone most days for her rheumatoid arthritis involving her hands.    INR: She has continuing her current dose of warfarin without change. She denies any significant change to her medical regimen. She feels well with no bleeding.   Past Medical History:  Diagnosis Date  . Acute pancreatitis   . Anemia   . Arthritis   . Atrial fibrillation (HCC)   . CAD (coronary artery disease)   . Carotid artery occlusion   . CHF (congestive heart failure) (HCC)   . Diabetes mellitus age 31  . DJD (degenerative joint disease)   . DVT (deep venous thrombosis) (HCC)   . GERD (gastroesophageal reflux disease)   . GI bleed   . Hypertension   . Joint pain   . Mesenteric ischemia   . Peripheral vascular disease (HCC)   . Thyroid disease    Past Surgical History:  Procedure Laterality Date  . APPENDECTOMY    . CELIAC ARTERY STENT    . CORONARY ARTERY BYPASS GRAFT    . EMBOLECTOMY  2009   right leg  . FASCIOTOMY     right leg  . HIP FRACTURE SURGERY Left   . JOINT REPLACEMENT    . TOTAL KNEE ARTHROPLASTY     bilateral  . TUBAL LIGATION    . VISCERAL ANGIOGRAM N/A 05/31/2013   Procedure: MESSENTERIC [name];  Surgeon: [name], MD;  Location: [facility] CATH LAB;  Service: Cardiovascular;  Laterality: N/A;   Social History  Substance Use Topics  . Smoking status: Never Smoker  . Smokeless tobacco: Never Used  . Alcohol use No   family history includes Cancer in her daughter and mother; Coronary artery disease in her sister; Diabetes in  her daughter, sister, and son; Heart disease in her sister; Hyperlipidemia in her son; Hypertension in her daughter and son; Other in her father and sister.  ROS as above:  Medications: Current Outpatient Prescriptions  Medication Sig Dispense Refill  . alendronate (FOSAMAX) 70 MG tablet Take by mouth.    Marland Kitchen aspirin 81 MG chewable tablet Chew by mouth.    Marland Kitchen aspirin 81 MG EC tablet TAKE ONE TABLET BY MOUTH EVERY DAY 28 tablet 2  . Calcium Carb-Cholecalciferol 600-800 MG-UNIT TABS TAKE ONE TABLET BY MOUTH 2 TIMES A DAY 60 tablet 6  . calcium carbonate (OS-CAL - DOSED IN MG OF ELEMENTAL CALCIUM) 1250 MG tablet Take 1 tablet by mouth daily with breakfast.    . cholecalciferol (VITAMIN D) 1000 UNITS tablet Take 2,000 Units by mouth daily.    . Cholecalciferol (VITAMIN D3) 2000 units capsule TAKE ONE CAPSULE BY MOUTH DAILY. (EVENING) 30 capsule 12  . ciprofloxacin (CIPRO) 250 MG tablet Take 1 tablet (250 mg total) by mouth 2 (two) times daily. 14 tablet 0  . clonazePAM (KLONOPIN) 0.5 MG tablet TAKE 1 TABLET BY MOUTH AT BEDTIME 28 tablet 4  . diltiazem (CARDIZEM CD) 240 MG 24 hr capsule TAKE 1 CAPSULE BY MOUTH AT BEDTIME 30 capsule 11  . Ergocalciferol (VITAMIN D2) 2000 units TABS Take by mouth.    . FERREX 150 150  MG capsule TAKE ONE CAPSULE BY MOUTH EVERY DAY 28 capsule 2  . glucose blood test strip Once daily E11.29 100 each 12  . leflunomide (ARAVA) 10 MG tablet TAKE 1 TABLET BY MOUTH AT BEDTIME 30 tablet 11  . levothyroxine (SYNTHROID, LEVOTHROID) 50 MCG tablet Take 50 mcg by mouth daily.      Marland Kitchen lisinopril (PRINIVIL,ZESTRIL) 20 MG tablet TAKE 1 TABLET BY MOUTH ONCE DAILY 28 tablet 10  . metoprolol succinate (TOPROL-XL) 25 MG 24 hr tablet TAKE 1 TABLET BY MOUTH ONCE DAILY 14 tablet 23  . Multiple Vitamin (MULTIVITAMIN WITH MINERALS) TABS tablet Take 1 tablet by mouth daily.    . pantoprazole (PROTONIX) 40 MG tablet TAKE 1 TABLET BY MOUTH ONCE DAILY 28 tablet 10  . pravastatin (PRAVACHOL) 40 MG  tablet Take 1 tablet (40 mg total) by mouth daily. 90 tablet 0  . predniSONE (DELTASONE) 5 MG tablet Take 1-2 tablets (5-10 mg total) by mouth daily as needed (Rheumatoid flair). 30 tablet 1  . STOOL SOFTENER 100 MG capsule TAKE 1 CAPSULE BY MOUTH 2 TIMES A DAY (AM & PM) 56 capsule 0  . torsemide (DEMADEX) 100 MG tablet TAKE 1/2 TABLET BY MOUTH ONCE DAILY 14 tablet 11  . traMADol (ULTRAM) 50 MG tablet TAKE 1 TABLET BY MOUTH TWICE A DAY TAKE 1 TABLET BY MOUTH TWICE A DAYAS NEEDED FOR PAIN 112 tablet 4  . warfarin (COUMADIN) 3 MG tablet TAKE 1 TABLET BY MOUTH ONCE A DAY 28 tablet 11  . benzonatate (TESSALON) 200 MG capsule Take 1 capsule (200 mg total) by mouth 3 (three) times daily as needed for cough. 45 capsule 0  . guaiFENesin-codeine 100-10 MG/5ML syrup Take 5 mLs by mouth at bedtime as needed for cough. 120 mL 0  . predniSONE (DELTASONE) 20 MG tablet Take 1 tablet (20 mg total) by mouth daily with breakfast. 5 tablet 0   No current facility-administered medications for this visit.    Allergies  Allergen Reactions  . Cefuroxime Anaphylaxis    Throat swelling  . Cephalexin Rash    Health Maintenance Health Maintenance  Topic Date Due  . FOOT EXAM  12/24/2015  . OPHTHALMOLOGY EXAM  07/13/2016  . HEMOGLOBIN A1C  09/27/2016  . TETANUS/TDAP  12/28/2025  . INFLUENZA VACCINE  Completed  . ZOSTAVAX  Completed  . PNA vac Low Risk Adult  Completed     Exam:  BP (!) 132/51   Pulse 66   Temp 98.4 F (36.9 C) (Oral)   Wt 156 lb (70.8 kg)   SpO2 97%   BMI 28.53 kg/m  Gen: Well NAD Nontoxic appearing HEENT: EOMI,  MMM Lungs: Normal work of breathing. Coarse breath sounds present bilaterally Heart: Irregular rhythm normal rate no MRG Abd: NABS, Soft. Nondistended, Nontender Exts: Brisk capillary refill, warm and well perfused.    Results for orders placed or performed in visit on 05/17/16 (from the past 72 hour(s))  POCT INR     Status: Abnormal   Collection Time: 05/17/16  2:27  PM  Result Value Ref Range   INR 5.6    No results found.    Assessment and Plan: 83 y.o. female with  Resolving bronchitis. Patient continues to have significantly coarse breath sounds however. Plan for chest x-ray and empiric treatment with low dose prednisone and Tessalon Perles and codeine cough syrup.  INR: Patient has an elevated point-of-care INR today. I'm not sure if this is a real result or not as her INRs  are typically pretty well controlled. Plan to check with a formal venipuncture INR. If still elevated will hold warfarin for a few days.   Orders Placed This Encounter  Procedures  . DG Chest 2 View    Order Specific Question:   Reason for exam:    Answer:   Cough, assess intra-thoracic pathology    Order Specific Question:   Preferred imaging location?    Answer:   [facility]  . INR/PT  . POCT INR   Meds ordered this encounter  Medications  . predniSONE (DELTASONE) 20 MG tablet    Sig: Take 1 tablet (20 mg total) by mouth daily with breakfast.    Dispense:  5 tablet    Refill:  0  . benzonatate (TESSALON) 200 MG capsule    Sig: Take 1 capsule (200 mg total) by mouth 3 (three) times daily as needed for cough.    Dispense:  45 capsule    Refill:  0  . guaiFENesin-codeine 100-10 MG/5ML syrup    Sig: Take 5 mLs by mouth at bedtime as needed for cough.    Dispense:  120 mL    Refill:  0     Discussed warning signs or symptoms. Please see discharge instructions. Patient expresses understanding.

## 2016-05-18 LAB — PROTIME-INR
INR: 4.4 — ABNORMAL HIGH
Prothrombin Time: 43.5 s — ABNORMAL HIGH (ref 9.0–11.5)

## 2016-06-14 ENCOUNTER — Telehealth: Payer: Self-pay | Admitting: Vascular Surgery

## 2016-06-14 NOTE — Telephone Encounter (Signed)
Spoke w/ Desmond Lope at [facility] regarding the schedule change of this patient's appt for 1 yr followup. Due to overbooks in the vascular lab we moved appt from 06/30/16 to 08/03/16. Camelia Eng will notify the patient's famikly members of the schedule change/awt

## 2016-06-23 ENCOUNTER — Other Ambulatory Visit: Payer: Self-pay | Admitting: Family Medicine

## 2016-06-30 ENCOUNTER — Ambulatory Visit: Payer: Medicare PPO | Admitting: Family

## 2016-06-30 ENCOUNTER — Encounter (HOSPITAL_COMMUNITY): Payer: Medicare PPO

## 2016-07-25 ENCOUNTER — Other Ambulatory Visit: Payer: Self-pay | Admitting: Family Medicine

## 2016-07-26 ENCOUNTER — Encounter: Payer: Self-pay | Admitting: Family Medicine

## 2016-07-26 ENCOUNTER — Ambulatory Visit (INDEPENDENT_AMBULATORY_CARE_PROVIDER_SITE_OTHER): Payer: Medicare PPO | Admitting: Family Medicine

## 2016-07-26 VITALS — BP 125/66 | HR 67 | Wt 159.0 lb

## 2016-07-26 DIAGNOSIS — N183 Chronic kidney disease, stage 3 unspecified: Secondary | ICD-10-CM

## 2016-07-26 DIAGNOSIS — Z7901 Long term (current) use of anticoagulants: Secondary | ICD-10-CM

## 2016-07-26 DIAGNOSIS — E119 Type 2 diabetes mellitus without complications: Secondary | ICD-10-CM

## 2016-07-26 DIAGNOSIS — M069 Rheumatoid arthritis, unspecified: Secondary | ICD-10-CM

## 2016-07-26 DIAGNOSIS — I4891 Unspecified atrial fibrillation: Secondary | ICD-10-CM

## 2016-07-26 DIAGNOSIS — E1122 Type 2 diabetes mellitus with diabetic chronic kidney disease: Secondary | ICD-10-CM | POA: Diagnosis not present

## 2016-07-26 LAB — POCT GLYCOSYLATED HEMOGLOBIN (HGB A1C): Hemoglobin A1C: 7.1

## 2016-07-26 LAB — GLUCOSE, POCT (MANUAL RESULT ENTRY): POC Glucose: 181 mg/dl — AB (ref 70–99)

## 2016-07-26 LAB — POCT INR: INR: 1.7

## 2016-07-26 MED ORDER — PREDNISONE 5 MG PO TABS: 5.0000 mg | ORAL_TABLET | Freq: Two times a day (BID) | ORAL | 2 refills | Status: DC | PRN

## 2016-07-26 NOTE — Progress Notes (Signed)
[name] is a 83 y.o. female who presents to [facility] [name]: Primary Care Sports Medicine today for follow-up of rheumatoid arthritis diabetes and atrial fibrillation.  Rheumatoid arthritis: Patient has significant rheumatoid arthritis over prolonged period of her life. She uses prednisone intermittently for flares. She typically has a really requires prednisone every few months. She notes that she needs refill of prednisone. Otherwise her rheumatoid arthritis is controlled with Arava. She feels well and denies fevers chills vomiting or diarrhea.  Diabetes: Patient has mild diabetes but does not take medications for it. She is diet-controlled. She denies polyuria or polydipsia feels well otherwise.  Atrial fibrillation requiring anticoagulation. Patient is doing well with no significant palpitations or new fatigue. She takes the stable dose of warfarin and has not changed her medication regime   Past Medical History:  Diagnosis Date  . Acute pancreatitis   . Anemia   . Arthritis   . Atrial fibrillation (HCC)   . CAD (coronary artery disease)   . Carotid artery occlusion   . CHF (congestive heart failure) (HCC)   . Diabetes mellitus age 5  . DJD (degenerative joint disease)   . DVT (deep venous thrombosis) (HCC)   . GERD (gastroesophageal reflux disease)   . GI bleed   . Hypertension   . Joint pain   . Mesenteric ischemia   . Peripheral vascular disease (HCC)   . Thyroid disease    Past Surgical History:  Procedure Laterality Date  . APPENDECTOMY    . CELIAC ARTERY STENT    . CORONARY ARTERY BYPASS GRAFT    . EMBOLECTOMY  2009   right leg  . FASCIOTOMY     right leg  . HIP FRACTURE SURGERY Left   . JOINT REPLACEMENT    . TOTAL KNEE ARTHROPLASTY     bilateral  . TUBAL LIGATION    . VISCERAL ANGIOGRAM N/A 05/31/2013   Procedure: MESSENTERIC [name];  Surgeon: [name],  MD;  Location: [facility] CATH LAB;  Service: Cardiovascular;  Laterality: N/A;   Social History  Substance Use Topics  . Smoking status: Never Smoker  . Smokeless tobacco: Never Used  . Alcohol use No   family history includes Cancer in her daughter and mother; Coronary artery disease in her sister; Diabetes in her daughter, sister, and son; Heart disease in her sister; Hyperlipidemia in her son; Hypertension in her daughter and son; Other in her father and sister.  ROS as above:  Medications: Current Outpatient Prescriptions  Medication Sig Dispense Refill  . alendronate (FOSAMAX) 70 MG tablet Take by mouth.    Marland Kitchen aspirin 81 MG EC tablet TAKE ONE TABLET BY MOUTH EVERY DAY 28 tablet 2  . benzonatate (TESSALON) 200 MG capsule Take 1 capsule (200 mg total) by mouth 3 (three) times daily as needed for cough. 45 capsule 0  . Calcium Carb-Cholecalciferol 600-800 MG-UNIT TABS TAKE ONE TABLET BY MOUTH 2 TIMES A DAY 60 tablet 6  . clonazePAM (KLONOPIN) 0.5 MG tablet TAKE 1 TABLET BY MOUTH AT BEDTIME 14 tablet 5  . diltiazem (CARDIZEM CD) 240 MG 24 hr capsule TAKE 1 CAPSULE BY MOUTH AT BEDTIME 30 capsule 11  . Ergocalciferol (VITAMIN D2) 2000 units TABS Take by mouth.    . FERREX 150 150 MG capsule TAKE ONE CAPSULE BY MOUTH EVERY DAY 28 capsule 2  . glucose blood test strip Once daily E11.29 100 each 12  . leflunomide (ARAVA) 10 MG tablet  TAKE 1 TABLET BY MOUTH AT BEDTIME 30 tablet 11  . levothyroxine (SYNTHROID, LEVOTHROID) 50 MCG tablet Take 50 mcg by mouth daily.      Marland Kitchen lisinopril (PRINIVIL,ZESTRIL) 20 MG tablet TAKE 1 TABLET BY MOUTH ONCE DAILY 28 tablet 10  . metoprolol succinate (TOPROL-XL) 25 MG 24 hr tablet TAKE 1 TABLET BY MOUTH ONCE DAILY 14 tablet 23  . Multiple Vitamin (MULTIVITAMIN WITH MINERALS) TABS tablet Take 1 tablet by mouth daily.    . pantoprazole (PROTONIX) 40 MG tablet TAKE 1 TABLET BY MOUTH ONCE DAILY 28 tablet 10  . pravastatin (PRAVACHOL) 40 MG tablet TAKE 1 TABLET BY MOUTH  ONCE DAILY 14 tablet 23  . predniSONE (DELTASONE) 5 MG tablet Take 1-2 tablets (5-10 mg total) by mouth 2 (two) times daily as needed (Rheumatoid flair). 30 tablet 2  . STOOL SOFTENER 100 MG capsule TAKE 1 CAPSULE BY MOUTH 2 TIMES A DAY (AM & PM) 56 capsule 0  . torsemide (DEMADEX) 100 MG tablet TAKE 1/2 TABLET BY MOUTH ONCE DAILY 14 tablet 11  . traMADol (ULTRAM) 50 MG tablet TAKE 1 TABLET BY MOUTH TWICE A DAY TAKE 1 TABLET BY MOUTH TWICE A DAYAS NEEDED FOR PAIN 56 tablet 4  . warfarin (COUMADIN) 3 MG tablet TAKE 1 TABLET BY MOUTH ONCE A DAY 28 tablet 11   No current facility-administered medications for this visit.    Allergies  Allergen Reactions  . Cefuroxime Anaphylaxis    Throat swelling  . Cephalexin Rash    Health Maintenance Health Maintenance  Topic Date Due  . FOOT EXAM  12/24/2015  . OPHTHALMOLOGY EXAM  07/13/2016  . INFLUENZA VACCINE  12/29/2016 (Originally 11/09/2016)  . HEMOGLOBIN A1C  09/27/2016  . TETANUS/TDAP  12/28/2025  . PNA vac Low Risk Adult  Completed     Exam:  BP 125/66   Pulse 67   Wt 159 lb (72.1 kg)   SpO2 95%   BMI 29.08 kg/m  Gen: Well NAD HEENT: EOMI,  MMM Lungs: Normal work of breathing. CTABL Heart: irregular normal rate no MRG Abd: NABS, Soft. Nondistended, Nontender Exts: Brisk capillary refill, warm and well perfused. Synovitis and ulnar deviation of the MCPs bilaterally Diabetic Foot Exam - Simple   Simple Foot Form Diabetic Foot exam was performed with the following findings:  Yes 07/26/2016  2:47 PM  Visual Inspection See comments:  Yes Sensation Testing See comments:  Yes Pulse Check Posterior Tibialis and Dorsalis pulse intact bilaterally:  Yes Comments Significant bunion and bunionette formation with ulnar deviation and hammertoe deformities bilaterally. No ulcerations present. Sensation decreased feet bilaterally. Pulses capillary refill and sensation intact       Results for orders placed or performed in visit on  07/26/16 (from the past 72 hour(s))  POCT HgB A1C     Status: None   Collection Time: 07/26/16  2:25 PM  Result Value Ref Range   Hemoglobin A1C 7.1   POCT Glucose (CBG)     Status: Abnormal   Collection Time: 07/26/16  2:25 PM  Result Value Ref Range   POC Glucose 181 (A) 70 - 99 mg/dl  POCT INR     Status: None   Collection Time: 07/26/16  2:25 PM  Result Value Ref Range   INR 1.7    No results found.    Assessment and Plan: 83 y.o. female with  Rheumatoid arthritis doing well continue current regimen recheck in 3 months. We'll likely get serum labs at that point.  Diabetes reasonably well-controlled continue current regimen.  Anticoagulation and atrial fibrillation. Rate controlled. Continue current anticoagulation recheck 3 months. Goal 2-3. IF INR is continuingly low will adjust dose up.   Orders Placed This Encounter  Procedures  . POCT HgB A1C  . POCT Glucose (CBG)  . POCT INR   Meds ordered this encounter  Medications  . predniSONE (DELTASONE) 5 MG tablet    Sig: Take 1-2 tablets (5-10 mg total) by mouth 2 (two) times daily as needed (Rheumatoid flair).    Dispense:  30 tablet    Refill:  2     Discussed warning signs or symptoms. Please see discharge instructions. Patient expresses understanding.

## 2016-07-26 NOTE — Patient Instructions (Signed)
Thank you for coming in today. Continue current medicines.  Recheck in 3 months.  Let me know if your flairs get worse.  Use prednisone sparingly.

## 2016-07-27 ENCOUNTER — Encounter: Payer: Self-pay | Admitting: Vascular Surgery

## 2016-08-03 ENCOUNTER — Ambulatory Visit (INDEPENDENT_AMBULATORY_CARE_PROVIDER_SITE_OTHER): Payer: Medicare PPO | Admitting: Family

## 2016-08-03 ENCOUNTER — Ambulatory Visit (INDEPENDENT_AMBULATORY_CARE_PROVIDER_SITE_OTHER)
Admission: RE | Admit: 2016-08-03 | Discharge: 2016-08-03 | Disposition: A | Discharge status: Home / Self Care | Payer: Medicare PPO | Source: Ambulatory Visit | Attending: Family | Admitting: Family

## 2016-08-03 ENCOUNTER — Encounter: Payer: Self-pay | Admitting: Family

## 2016-08-03 ENCOUNTER — Ambulatory Visit (HOSPITAL_COMMUNITY)
Admission: RE | Admit: 2016-08-03 | Discharge: 2016-08-03 | Disposition: A | Discharge status: Home / Self Care | Payer: Medicare PPO | Source: Ambulatory Visit | Attending: Family | Admitting: Family

## 2016-08-03 VITALS — BP 179/89 | HR 93 | Temp 99.0°F | Resp 20 | Ht 62.0 in | Wt 157.0 lb

## 2016-08-03 DIAGNOSIS — I714 Abdominal aortic aneurysm, without rupture, unspecified: Secondary | ICD-10-CM

## 2016-08-03 DIAGNOSIS — I6523 Occlusion and stenosis of bilateral carotid arteries: Secondary | ICD-10-CM | POA: Diagnosis not present

## 2016-08-03 DIAGNOSIS — I771 Stricture of artery: Secondary | ICD-10-CM

## 2016-08-03 DIAGNOSIS — Z959 Presence of cardiac and vascular implant and graft, unspecified: Secondary | ICD-10-CM | POA: Diagnosis not present

## 2016-08-03 DIAGNOSIS — I774 Celiac artery compression syndrome: Secondary | ICD-10-CM | POA: Diagnosis not present

## 2016-08-03 DIAGNOSIS — K55069 Acute infarction of intestine, part and extent unspecified: Secondary | ICD-10-CM

## 2016-08-03 LAB — VAS US CAROTID
LCCADSYS: -105 cm/s
LCCAPSYS: 120 cm/s
LEFT ECA DIAS: 0 cm/s
Left CCA dist dias: -30 cm/s
Left CCA prox dias: 24 cm/s
Left ICA dist dias: -38 cm/s
Left ICA dist sys: -151 cm/s
Left ICA prox dias: 84 cm/s
Left ICA prox sys: 296 cm/s
RCCADSYS: -153 cm/s
RCCAPDIAS: 29 cm/s
RCCAPSYS: 151 cm/s
RIGHT CCA MID DIAS: 26 cm/s
RIGHT ECA DIAS: 27 cm/s
RIGHT VERTEBRAL DIAS: 14 cm/s

## 2016-08-03 NOTE — Progress Notes (Signed)
CC: Follow up Chronic Mesenteric Artery Stenosis and Extracranial Carotid artery stenosis  History of Present Illness  [name] is a 83 y.o. (1930-02-13) female patient of Dr. Darrick Penna who is s/p celiac artery stent placed 09/02/08 and restented 05/31/13. 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  Since the above procedure pt states she has had no more post prandial abdominal pain other than what seems like bloating for 30-90 minutes after eating. She did have post prandial abdominal pain prior to this.  She returns today for routine surveillance.  She is currently living at [facility]. She is on Coumadin for atrial fibrillation. Other chronic medical problems include diabetes hypertension congestive heart failure degenerative joint disease.   Pt states 159/55 was her blood pressure this morning.   She fell in January 2017, fractured her left hip, had an ORIF at [facility].  She has had both knees replaced. Her walking seems limited by her OA and RA issues, not claudication.  The RA in her hands is bothering her more than any other issue.  Pt denies any history of stroke or TIA. She takes a daily ASA and a statin. Pt denies any history of MI, but has a history of CABG x3 vessels.  She has never used tobacco, but she was exposed to her husband's second hand smoke for 37 years until he died in 42.  Pt reports DM neuropathy as manifested by shooting pains from feet that travel upwards at times, mostly at night.  She states her DM is diet controlled. However, her A1C on 07-26-16 was 7.1 (review of records). She is not taking any DM medications. She takes prednisone for her RA.  She denies non healing wounds.    Past Medical History:  Diagnosis Date  . Acute pancreatitis   . Anemia   . Arthritis   . Atrial fibrillation (HCC)   . CAD (coronary artery disease)   . Carotid artery occlusion   . CHF (congestive heart  failure) (HCC)   . Diabetes mellitus age 89  . DJD (degenerative joint disease)   . DVT (deep venous thrombosis) (HCC)   . GERD (gastroesophageal reflux disease)   . GI bleed   . Hypertension   . Joint pain   . Mesenteric ischemia   . Peripheral vascular disease (HCC)   . Thyroid disease     Social History Social History  Substance Use Topics  . Smoking status: Never Smoker  . Smokeless tobacco: Never Used  . Alcohol use No    Family History Family History  Problem Relation Age of Onset  . Cancer Mother     ovarian  . Other Father     suicide  . Coronary artery disease Sister   . Other Sister     alzheimers  . Heart disease Sister     Before age 96  . Diabetes Sister   . Cancer Daughter     spinal tumor  . Diabetes Daughter   . Hypertension Daughter   . Diabetes Son   . Hyperlipidemia Son   . Hypertension Son     Surgical History Past Surgical History:  Procedure Laterality Date  . APPENDECTOMY    . CELIAC ARTERY STENT    . CORONARY ARTERY BYPASS GRAFT    . EMBOLECTOMY  2009   right leg  . FASCIOTOMY     right leg  . HIP FRACTURE SURGERY Left   . JOINT REPLACEMENT    .  TOTAL KNEE ARTHROPLASTY     bilateral  . TUBAL LIGATION    . VISCERAL ANGIOGRAM N/A 05/31/2013   Procedure: MESSENTERIC [name];  Surgeon: [name], MD;  Location: [facility] CATH LAB;  Service: Cardiovascular;  Laterality: N/A;    Allergies  Allergen Reactions  . Cefuroxime Anaphylaxis    Throat swelling  . Cephalexin Rash    Current Outpatient Prescriptions  Medication Sig Dispense Refill  . alendronate (FOSAMAX) 70 MG tablet Take by mouth.    Marland Kitchen aspirin 81 MG EC tablet TAKE ONE TABLET BY MOUTH EVERY DAY 28 tablet 2  . Calcium Carb-Cholecalciferol 600-800 MG-UNIT TABS TAKE ONE TABLET BY MOUTH 2 TIMES A DAY 60 tablet 6  . clonazePAM (KLONOPIN) 0.5 MG tablet TAKE 1 TABLET BY MOUTH AT BEDTIME 14 tablet 5  . diltiazem (CARDIZEM CD) 240 MG 24 hr capsule TAKE 1 CAPSULE BY MOUTH AT  BEDTIME 30 capsule 11  . Ergocalciferol (VITAMIN D2) 2000 units TABS Take by mouth.    . FERREX 150 150 MG capsule TAKE ONE CAPSULE BY MOUTH EVERY DAY 28 capsule 2  . glucose blood test strip Once daily E11.29 100 each 12  . leflunomide (ARAVA) 10 MG tablet TAKE 1 TABLET BY MOUTH AT BEDTIME 30 tablet 11  . levothyroxine (SYNTHROID, LEVOTHROID) 50 MCG tablet Take 50 mcg by mouth daily.      Marland Kitchen lisinopril (PRINIVIL,ZESTRIL) 20 MG tablet TAKE 1 TABLET BY MOUTH ONCE DAILY 28 tablet 10  . metoprolol succinate (TOPROL-XL) 25 MG 24 hr tablet TAKE 1 TABLET BY MOUTH ONCE DAILY 14 tablet 23  . Multiple Vitamin (MULTIVITAMIN WITH MINERALS) TABS tablet Take 1 tablet by mouth daily.    . pantoprazole (PROTONIX) 40 MG tablet TAKE 1 TABLET BY MOUTH ONCE DAILY 28 tablet 10  . pravastatin (PRAVACHOL) 40 MG tablet TAKE 1 TABLET BY MOUTH ONCE DAILY 14 tablet 23  . predniSONE (DELTASONE) 5 MG tablet Take 1-2 tablets (5-10 mg total) by mouth 2 (two) times daily as needed (Rheumatoid flair). 30 tablet 2  . STOOL SOFTENER 100 MG capsule TAKE 1 CAPSULE BY MOUTH 2 TIMES A DAY (AM & PM) 56 capsule 0  . torsemide (DEMADEX) 100 MG tablet TAKE 1/2 TABLET BY MOUTH ONCE DAILY 14 tablet 11  . traMADol (ULTRAM) 50 MG tablet TAKE 1 TABLET BY MOUTH TWICE A DAY TAKE 1 TABLET BY MOUTH TWICE A DAYAS NEEDED FOR PAIN 56 tablet 4  . warfarin (COUMADIN) 3 MG tablet TAKE 1 TABLET BY MOUTH ONCE A DAY 28 tablet 11   No current facility-administered medications for this visit.     ROS: see HPI for pertinent positives and negatives    Physical Examination  Vitals:   08/03/16 1038 08/03/16 1046  BP: (!) 153/93 (!) 179/89  Pulse: 93   Resp: 20   Temp: 99 F (37.2 C)   TempSrc: Oral   SpO2: 90%   Weight: 157 lb (71.2 kg)   Height: 5\' 2"  (1.575 m)    Body mass index is 28.72 kg/m.  General: A&O x 3, WDWN.  Gait: slow, deliberate, using rolling walker  Pulmonary: Sym exp, respirations are non labored, good air movt, CTAB,  no rales, rhonchi, or wheezing.  Cardiac: irregular rhythm, controlled rate, + murmur  Vascular: Vessel Right Left  Radial 1+Palpable 1+Palpable  Carotid with bruit without bruit  Aorta Not palpable N/A  Femoral 2+ palpable 2+ palpable  Popliteal not palpable not palpable  PT 1+ Palpable 1+ Palpable  DP  2+Palpable 1+Palpable   Gastrointestinal: soft, NTND, -G/R, - HSM, - palpable masses, - CVAT B.  Musculoskeletal: M/S 3/5 throughout, Extremities without ischemic changes. Severe RA and OA deformities in hands.  Neurologic: Pain and light touch intact in extremities, Motor exam as listed above.  .   Non-Invasive Vascular Imaging  Mesenteric Duplex (Date: 08/03/2016):   Ao: 72 cm/c. Mid aorta diameter of 3.4 x 3.3 cm; previous study on 06-18-15 was 3.4 x 3.5 cm.   Celiac artery: 231 cm/s; on 06-18-15 was 375 cm/s. Velocities not as high as previous study. Limited visualization and spectral aliasing.   SMA: NV; on 06-18-15 documented occluded  Carotid Duplex: 60-79% bilateral proximal ICA stenosis.  Right ECA stenosis. Bilateral vertebral artery flow is antegrade.  Bilateral subclavian artery waveforms are normal.  No significant change compared to the last exam on 12-12-13.    Medical Decision Making  [name] is a 83 y.o. female who is s/p celiac artery stent placed 09/02/08 and restented 05/31/13. 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 Since the above procedure pt states she has had no more post prandial abdominal pain other than gas bloating, did have post prandial abdominal pain prior to this. Her weight was 156# at her visit in March 2017, is 157# today. Dr. Darrick Penna indicated that she is not really a candidate for open operation. Additionally she has bilateral moderate/severe asymptomatic carotid stenosis and a small AAA.   Based on her exam and studies, I have offered the patient carotid duplex and mesenteric  artery duplex in 6 months.  I discussed in depth with the patient the nature of atherosclerosis, and emphasized the importance of maximal medical management including strict control of blood pressure, blood glucose, and lipid levels, obtaining regular exercise, and cessation of smoking.    The patient is aware that without maximal medical management the underlying atherosclerotic disease process will progress, limiting the benefit of any interventions. The patient is currently on a statin. The patient is currently on an anti-platelet agent.  Thank you for allowing [place] to participate in this patient's care.  Charisse March, RN, MSN, FNP-C [facility] Office: [phone]  Clinic MD: Edilia Bo  08/03/2016, 11:03 AM

## 2016-08-03 NOTE — Patient Instructions (Addendum)
Stroke Prevention Some medical conditions and behaviors are associated with an increased chance of having a stroke. You may prevent a stroke by making healthy choices and managing medical conditions. How can I reduce my risk of having a stroke?  Stay physically active. Get at least 30 minutes of activity on most or all days.  Do not smoke. It may also be helpful to avoid exposure to secondhand smoke.  Limit alcohol use. Moderate alcohol use is considered to be:  No more than 2 drinks per day for men.  No more than 1 drink per day for nonpregnant women.  Eat healthy foods. This involves:  Eating 5 or more servings of fruits and vegetables a day.  Making dietary changes that address high blood pressure (hypertension), high cholesterol, diabetes, or obesity.  Manage your cholesterol levels.  Making food choices that are high in fiber and low in saturated fat, trans fat, and cholesterol may control cholesterol levels.  Take any prescribed medicines to control cholesterol as directed by your health care provider.  Manage your diabetes.  Controlling your carbohydrate and sugar intake is recommended to manage diabetes.  Take any prescribed medicines to control diabetes as directed by your health care provider.  Control your hypertension.  Making food choices that are low in salt (sodium), saturated fat, trans fat, and cholesterol is recommended to manage hypertension.  Ask your health care provider if you need treatment to lower your blood pressure. Take any prescribed medicines to control hypertension as directed by your health care provider.  If you are 18-39 years of age, have your blood pressure checked every 3-5 years. If you are 40 years of age or older, have your blood pressure checked every year.  Maintain a healthy weight.  Reducing calorie intake and making food choices that are low in sodium, saturated fat, trans fat, and cholesterol are recommended to manage  weight.  Stop drug abuse.  Avoid taking birth control pills.  Talk to your health care provider about the risks of taking birth control pills if you are over 35 years old, smoke, get migraines, or have ever had a blood clot.  Get evaluated for sleep disorders (sleep apnea).  Talk to your health care provider about getting a sleep evaluation if you snore a lot or have excessive sleepiness.  Take medicines only as directed by your health care provider.  For some people, aspirin or blood thinners (anticoagulants) are helpful in reducing the risk of forming abnormal blood clots that can lead to stroke. If you have the irregular heart rhythm of atrial fibrillation, you should be on a blood thinner unless there is a good reason you cannot take them.  Understand all your medicine instructions.  Make sure that other conditions (such as anemia or atherosclerosis) are addressed. Get help right away if:  You have sudden weakness or numbness of the face, arm, or leg, especially on one side of the body.  Your face or eyelid droops to one side.  You have sudden confusion.  You have trouble speaking (aphasia) or understanding.  You have sudden trouble seeing in one or both eyes.  You have sudden trouble walking.  You have dizziness.  You have a loss of balance or coordination.  You have a sudden, severe headache with no known cause.  You have new chest pain or an irregular heartbeat. Any of these symptoms may represent a serious problem that is an emergency. Do not wait to see if the symptoms will go away.   Get medical help at once. Call your local emergency services (911 in [place] Do not drive yourself to the hospital. This information is not intended to replace advice given to you by your health care provider. Make sure you discuss any questions you have with your health care provider. Document Released: 05/05/2004 Document Revised: 09/03/2015 Document Reviewed: 09/28/2012 Elsevier  Interactive Patient Education  2017 [employer]     Chronic Mesenteric Ischemia Mesenteric ischemia is poor blood flow (circulation) in the vessels that supply blood to the stomach, intestines, and liver (mesenteric organs). Chronic mesenteric ischemia, also called mesenteric angina or intestinal angina, is a long-term (chronic) condition. It happens when an artery or vein that provides blood to the mesenteric organs gradually becomes blocked or narrow, restricting the blood supply to the organs. When the blood supply is severely restricted, the mesenteric organs cannot work properly. What are the causes? This condition is commonly caused by fatty deposits that build up in an artery (plaque), which can narrow the artery and restrict blood flow. Other causes include:  Weakened areas in blood vessel walls (aneurysms).  Conditions that cause twisting or inflammation of blood vessels, such as fibromuscular dysplasia or arteritis.  A disorder in which blood clots form in the veins (venous thrombosis).  Scarring and thickening (fibrosis) of blood vessels caused by radiation therapy.  A tear in the aorta, the body's main artery (aortic dissection).  Blood vessel problems after illegal drug use, such as use of cocaine.  Tumors in the nervous system (neurofibromatosis).  Certain autoimmune diseases, such as lupus. What increases the risk? The following factors may make you more likely to develop this condition:  Being female.  Being over age 70, especially if you have a history of heart problems.  Smoking.  Congestive heart failure.  Irregular heartbeat (arrhythmia).  Having a history of heart attack or stroke.  Diabetes.  High cholesterol.  High blood pressure (hypertension).  Being overweight or obese.  Kidney disease (renal disease) requiring dialysis. What are the signs or symptoms? Symptoms of this condition include:  Abdomen (abdominal) pain or cramps that develop  15-60 minutes after a meal. This pain may last for 1-3 hours. Some people may develop a fear of eating because of this symptom.  Weight loss.  Diarrhea.  Bloody stool.  Nausea.  Vomiting.  Bloating.  Abdominal pain after stress or with exercise. How is this diagnosed? This condition is diagnosed based on:  Your medical history.  A physical exam.  Tests, such as:  Ultrasound.  CT scan.  Blood tests.  Urine tests.  An imaging test that involves injecting a dye into your arteries to show blood flow through blood vessels (angiogram). This can help to show if there are any blockages in the vessels that lead to the intestines.  Passing a small probe through the mouth and into the stomach to measure the output of carbon dioxide (gastric tonometry). This can help to indicate whether there is decreased blood flow to the stomach and intestines. How is this treated? This condition may be treated with:  Dietary changes such as eating smaller, low-fat, meals more frequently.  Lifestyle changes to treat underlying conditions that contribute to the disease, such as high cholesterol and high blood pressure.  Medicines to reduce blood clotting and increase blood flow.  Surgery to remove the blockage, repair arteries or veins, and restore blood flow. This may involve:  Angioplasty. This is surgery to widen the affected artery, reduce the blockage, and sometimes insert  a small, mesh tube (stent).  Bypass surgery. This may be done to go around (bypass) the blockage and reconnect healthy arteries or veins.  Placing a stent in the affected area. This may be done to help keep blocked arteries open. Follow these instructions at home: Eating and drinking   Eat a heart-healthy diet. This includes fresh fruits and vegetables, whole grains, and lean proteins like chicken, fish, eggs, and beans.  Avoid foods that contain a lot of:  Salt (sodium).  Sugar.  Saturated fat (such as red  meat).  Trans fat (such as fried foods).  Stay hydrated. Drink enough fluid to keep your urine clear or pale yellow. Lifestyle   Stay active and get regular exercise as told by your health care provider. Aim for 150 minutes of moderate activity or 75 minutes of vigorous activity a week. Ask your health care provider what activities and forms of exercise are safe for you.  Maintain a healthy weight.  Work with your health care provider to manage your cholesterol.  Manage any other health problems you have, such as high blood pressure, diabetes, or heart rhythm problems.  Do not use any products that contain nicotine or tobacco, such as cigarettes and e-cigarettes. If you need help quitting, ask your health care provider. General instructions   Take over-the-counter and prescription medicines only as told by your health care provider.  Keep all follow-up visits as told by your health care provider. This is important. Contact a health care provider if:  Your symptoms do not improve or they return after treatment.  You have a fever. Get help right away if:  You have severe abdominal pain.  You have severe chest pain.  You have shortness of breath.  You feel weak or dizzy.  You have palpitations.  You have numbness or weakness in your face, arm, or leg.  You are confused.  You have trouble speaking or people have trouble understanding what you are saying.  You are constipated.  You have trouble urinating.  You have blood in your stool.  You have severe nausea, vomiting, or persistent diarrhea. Summary  Mesenteric ischemia is poor circulation in the vessels that supply blood to the the stomach, intestines, and liver (mesenteric organs).  This condition happens when an artery or vein that provides blood to the mesenteric organs gradually becomes blocked or narrow, restricting the blood supply to the organs.  This condition is commonly caused by fatty deposits that  build up in an artery (plaque), which can narrow the artery and restrict blood flow.  You are more likely to develop this condition if you are over age 38 and have a history of heart problems, high blood pressure, diabetes, or high cholesterol.  This condition is usually treated with medicines, dietary and lifestyle changes, and surgery to remove the blockage, repair arteries or veins, and restore blood flow. This information is not intended to replace advice given to you by your health care provider. Make sure you discuss any questions you have with your health care provider. Document Released: 11/15/2010 Document Revised: 03/12/2016 Document Reviewed: 03/12/2016 Elsevier Interactive Patient Education  2017 [employer]     Before your next abdominal ultrasound:  Take two Extra-Strength Gas-X capsules at bedtime the night before the test. Take another two Extra-Strength Gas-X capsules 3 hours before the test.

## 2016-08-04 ENCOUNTER — Ambulatory Visit: Payer: Self-pay | Admitting: Family

## 2016-08-04 ENCOUNTER — Encounter (HOSPITAL_COMMUNITY): Payer: Medicare PPO

## 2016-08-04 ENCOUNTER — Encounter (HOSPITAL_COMMUNITY): Payer: Self-pay

## 2016-08-04 NOTE — Addendum Note (Signed)
Addended by: Burton Apley A on: 08/04/2016 08:24 AM   Modules accepted: Orders

## 2016-08-08 ENCOUNTER — Telehealth: Payer: Self-pay | Admitting: *Deleted

## 2016-08-08 DIAGNOSIS — M069 Rheumatoid arthritis, unspecified: Secondary | ICD-10-CM

## 2016-08-08 NOTE — Telephone Encounter (Signed)
Patient's daughter called stating prescription for methotrexate for her rheumatoid arthritis needs to be sent to [facility] in [place]. I do not see this on her med list.please advise

## 2016-08-09 NOTE — Telephone Encounter (Signed)
Spoke with patient's daughter and apparently she was on this medication years ago prescribed by another provider in [place]. She does not know what does it was. She states she mentioned to you at the last visit that she wants to restart this medication due to the increased flares of arthritis she was having. The daughter also wants to know if you can place a referral for a rheumatologist.   The daughter also wants to know if the prednisone was the cause of her A1c being slightly elevated.

## 2016-08-09 NOTE — Telephone Encounter (Signed)
I have no history of [name] being on Methotrexate. I was not aware that she was taking this medicine.  Who was prescribing it and what was the dose?

## 2016-08-11 NOTE — Telephone Encounter (Signed)
Message left on vm.(tanya who is the daughter)

## 2016-08-11 NOTE — Telephone Encounter (Signed)
Will refer to rheumatology as the disease is getting worse.  More prednisone will cause elevated blood sugars (A1C).  Methotrexate will also make INR (warfarin) harder to control.

## 2016-08-12 ENCOUNTER — Telehealth: Payer: Self-pay | Admitting: *Deleted

## 2016-08-12 DIAGNOSIS — M0579 Rheumatoid arthritis with rheumatoid factor of multiple sites without organ or systems involvement: Secondary | ICD-10-CM

## 2016-08-12 NOTE — Telephone Encounter (Signed)
Patient's daughter wanted a referral to Dr. Orlin Hilding for rheumatology. Referral placed. The daughter [name]) also wanted to know if Dr Denyse Amass would write a prescription for Methotrexate until the patient is seen by rheumatologist. Per Dr. Denyse Amass left message on [name]'s voicemail stating Dr. Denyse Amass does not manage Methotrexate and it would be more appropriate for the rhem to manage this when she establishes with them.

## 2016-08-25 ENCOUNTER — Other Ambulatory Visit: Payer: Self-pay | Admitting: Family Medicine

## 2016-08-30 ENCOUNTER — Other Ambulatory Visit: Payer: Self-pay | Admitting: Family Medicine

## 2016-10-03 ENCOUNTER — Other Ambulatory Visit: Payer: Self-pay | Admitting: Family Medicine

## 2016-10-11 ENCOUNTER — Telehealth: Payer: Self-pay

## 2016-10-11 NOTE — Telephone Encounter (Signed)
Yes we will do so

## 2016-10-11 NOTE — Telephone Encounter (Signed)
Pt's daughter called and [name] has gout.  Her rheumatologist has put her on allopurinol for this.  He wants her to have a Protime done each month while on this.  Can you check this at her appointment on July 17th, and have her come back once a month while she is taking the medication.

## 2016-10-11 NOTE — Telephone Encounter (Signed)
FYI-They would also like a urine done to make sure her kidneys are functioning correctly.  She has been having issues with incontinence for a couple of weeks.

## 2016-10-14 ENCOUNTER — Other Ambulatory Visit: Payer: Self-pay | Admitting: Family Medicine

## 2016-10-17 ENCOUNTER — Other Ambulatory Visit: Payer: Self-pay | Admitting: Family Medicine

## 2016-10-20 ENCOUNTER — Encounter (HOSPITAL_COMMUNITY): Payer: Self-pay

## 2016-10-20 ENCOUNTER — Emergency Department (HOSPITAL_COMMUNITY): Payer: Medicare PPO

## 2016-10-20 ENCOUNTER — Inpatient Hospital Stay (HOSPITAL_COMMUNITY)
Admission: EM | Admit: 2016-10-20 | Discharge: 2016-10-24 | DRG: 308 | Disposition: A | Discharge status: Home / Self Care | Payer: Medicare PPO | Attending: Family Medicine | Admitting: Family Medicine

## 2016-10-20 DIAGNOSIS — I2510 Atherosclerotic heart disease of native coronary artery without angina pectoris: Secondary | ICD-10-CM | POA: Diagnosis present

## 2016-10-20 DIAGNOSIS — Z86718 Personal history of other venous thrombosis and embolism: Secondary | ICD-10-CM

## 2016-10-20 DIAGNOSIS — I4891 Unspecified atrial fibrillation: Secondary | ICD-10-CM | POA: Diagnosis not present

## 2016-10-20 DIAGNOSIS — I482 Chronic atrial fibrillation: Secondary | ICD-10-CM | POA: Diagnosis not present

## 2016-10-20 DIAGNOSIS — Z7983 Long term (current) use of bisphosphonates: Secondary | ICD-10-CM

## 2016-10-20 DIAGNOSIS — I110 Hypertensive heart disease with heart failure: Secondary | ICD-10-CM | POA: Diagnosis present

## 2016-10-20 DIAGNOSIS — M79605 Pain in left leg: Secondary | ICD-10-CM

## 2016-10-20 DIAGNOSIS — Z7982 Long term (current) use of aspirin: Secondary | ICD-10-CM

## 2016-10-20 DIAGNOSIS — Z7901 Long term (current) use of anticoagulants: Secondary | ICD-10-CM

## 2016-10-20 DIAGNOSIS — K219 Gastro-esophageal reflux disease without esophagitis: Secondary | ICD-10-CM | POA: Diagnosis present

## 2016-10-20 DIAGNOSIS — M069 Rheumatoid arthritis, unspecified: Secondary | ICD-10-CM | POA: Diagnosis present

## 2016-10-20 DIAGNOSIS — E1129 Type 2 diabetes mellitus with other diabetic kidney complication: Secondary | ICD-10-CM | POA: Diagnosis present

## 2016-10-20 DIAGNOSIS — I5023 Acute on chronic systolic (congestive) heart failure: Secondary | ICD-10-CM

## 2016-10-20 DIAGNOSIS — N183 Chronic kidney disease, stage 3 (moderate): Secondary | ICD-10-CM | POA: Diagnosis not present

## 2016-10-20 DIAGNOSIS — I739 Peripheral vascular disease, unspecified: Secondary | ICD-10-CM | POA: Diagnosis present

## 2016-10-20 DIAGNOSIS — I5033 Acute on chronic diastolic (congestive) heart failure: Secondary | ICD-10-CM

## 2016-10-20 DIAGNOSIS — Z95828 Presence of other vascular implants and grafts: Secondary | ICD-10-CM

## 2016-10-20 DIAGNOSIS — T501X6A Underdosing of loop [high-ceiling] diuretics, initial encounter: Secondary | ICD-10-CM | POA: Diagnosis present

## 2016-10-20 DIAGNOSIS — Z79899 Other long term (current) drug therapy: Secondary | ICD-10-CM

## 2016-10-20 DIAGNOSIS — E1122 Type 2 diabetes mellitus with diabetic chronic kidney disease: Secondary | ICD-10-CM | POA: Diagnosis not present

## 2016-10-20 DIAGNOSIS — T461X6A Underdosing of calcium-channel blockers, initial encounter: Secondary | ICD-10-CM | POA: Diagnosis present

## 2016-10-20 DIAGNOSIS — Z96653 Presence of artificial knee joint, bilateral: Secondary | ICD-10-CM | POA: Diagnosis present

## 2016-10-20 DIAGNOSIS — M79604 Pain in right leg: Secondary | ICD-10-CM | POA: Diagnosis not present

## 2016-10-20 DIAGNOSIS — M1990 Unspecified osteoarthritis, unspecified site: Secondary | ICD-10-CM | POA: Diagnosis present

## 2016-10-20 DIAGNOSIS — E039 Hypothyroidism, unspecified: Secondary | ICD-10-CM | POA: Diagnosis present

## 2016-10-20 DIAGNOSIS — Z881 Allergy status to other antibiotic agents status: Secondary | ICD-10-CM

## 2016-10-20 DIAGNOSIS — I6529 Occlusion and stenosis of unspecified carotid artery: Secondary | ICD-10-CM | POA: Diagnosis present

## 2016-10-20 DIAGNOSIS — Z951 Presence of aortocoronary bypass graft: Secondary | ICD-10-CM

## 2016-10-20 DIAGNOSIS — E1142 Type 2 diabetes mellitus with diabetic polyneuropathy: Secondary | ICD-10-CM | POA: Diagnosis present

## 2016-10-20 DIAGNOSIS — I5043 Acute on chronic combined systolic (congestive) and diastolic (congestive) heart failure: Secondary | ICD-10-CM | POA: Diagnosis present

## 2016-10-20 DIAGNOSIS — N179 Acute kidney failure, unspecified: Secondary | ICD-10-CM | POA: Diagnosis present

## 2016-10-20 DIAGNOSIS — E1151 Type 2 diabetes mellitus with diabetic peripheral angiopathy without gangrene: Secondary | ICD-10-CM | POA: Diagnosis present

## 2016-10-20 DIAGNOSIS — Z66 Do not resuscitate: Secondary | ICD-10-CM | POA: Diagnosis present

## 2016-10-20 LAB — CBC WITH DIFFERENTIAL/PLATELET
BASOS ABS: 0 10*3/uL (ref 0.0–0.1)
Basophils Relative: 0 %
Eosinophils Absolute: 0.1 10*3/uL (ref 0.0–0.7)
Eosinophils Relative: 1 %
HEMATOCRIT: 38 % (ref 36.0–46.0)
HEMOGLOBIN: 11.9 g/dL — AB (ref 12.0–15.0)
Lymphocytes Relative: 23 %
Lymphs Abs: 2.4 10*3/uL (ref 0.7–4.0)
MCH: 27.3 pg (ref 26.0–34.0)
MCHC: 31.3 g/dL (ref 30.0–36.0)
MCV: 87.2 fL (ref 78.0–100.0)
MONO ABS: 0.7 10*3/uL (ref 0.1–1.0)
Monocytes Relative: 7 %
NEUTROS ABS: 7.1 10*3/uL (ref 1.7–7.7)
NEUTROS PCT: 69 %
Platelets: 298 10*3/uL (ref 150–400)
RBC: 4.36 MIL/uL (ref 3.87–5.11)
RDW: 19.4 % — ABNORMAL HIGH (ref 11.5–15.5)
WBC: 10.3 10*3/uL (ref 4.0–10.5)

## 2016-10-20 LAB — COMPREHENSIVE METABOLIC PANEL
ALBUMIN: 2.9 g/dL — AB (ref 3.5–5.0)
ALK PHOS: 84 U/L (ref 38–126)
ALT: 22 U/L (ref 14–54)
ANION GAP: 7 (ref 5–15)
AST: 21 U/L (ref 15–41)
BILIRUBIN TOTAL: 1.3 mg/dL — AB (ref 0.3–1.2)
BUN: 18 mg/dL (ref 6–20)
CALCIUM: 9.3 mg/dL (ref 8.9–10.3)
CO2: 28 mmol/L (ref 22–32)
CREATININE: 1.02 mg/dL — AB (ref 0.44–1.00)
Chloride: 102 mmol/L (ref 101–111)
GFR calc Af Amer: 57 mL/min — ABNORMAL LOW (ref >60)
GFR calc non Af Amer: 49 mL/min — ABNORMAL LOW (ref >60)
GLUCOSE: 128 mg/dL — AB (ref 65–99)
Potassium: 4.3 mmol/L (ref 3.5–5.1)
Sodium: 137 mmol/L (ref 135–145)
TOTAL PROTEIN: 6.5 g/dL (ref 6.5–8.1)

## 2016-10-20 LAB — BRAIN NATRIURETIC PEPTIDE: B NATRIURETIC PEPTIDE 5: 345.8 pg/mL — AB (ref 0.0–100.0)

## 2016-10-20 LAB — ACETAMINOPHEN LEVEL: Acetaminophen (Tylenol), Serum: 15 ug/mL (ref 10–30)

## 2016-10-20 LAB — PROTIME-INR
INR: 2.33
PROTHROMBIN TIME: 26 s — AB (ref 11.4–15.2)

## 2016-10-20 LAB — I-STAT TROPONIN, ED: Troponin i, poc: 0.02 ng/mL (ref 0.00–0.08)

## 2016-10-20 IMAGING — DX DG CHEST 2V
2 series · 2 of 2 positions shown · non-contrast
Comparison: [DATE]

CLINICAL DATA: Shortness of breath. Bilateral leg pain and
swelling.

EXAM:
CHEST  2 VIEW

[w chest lat]
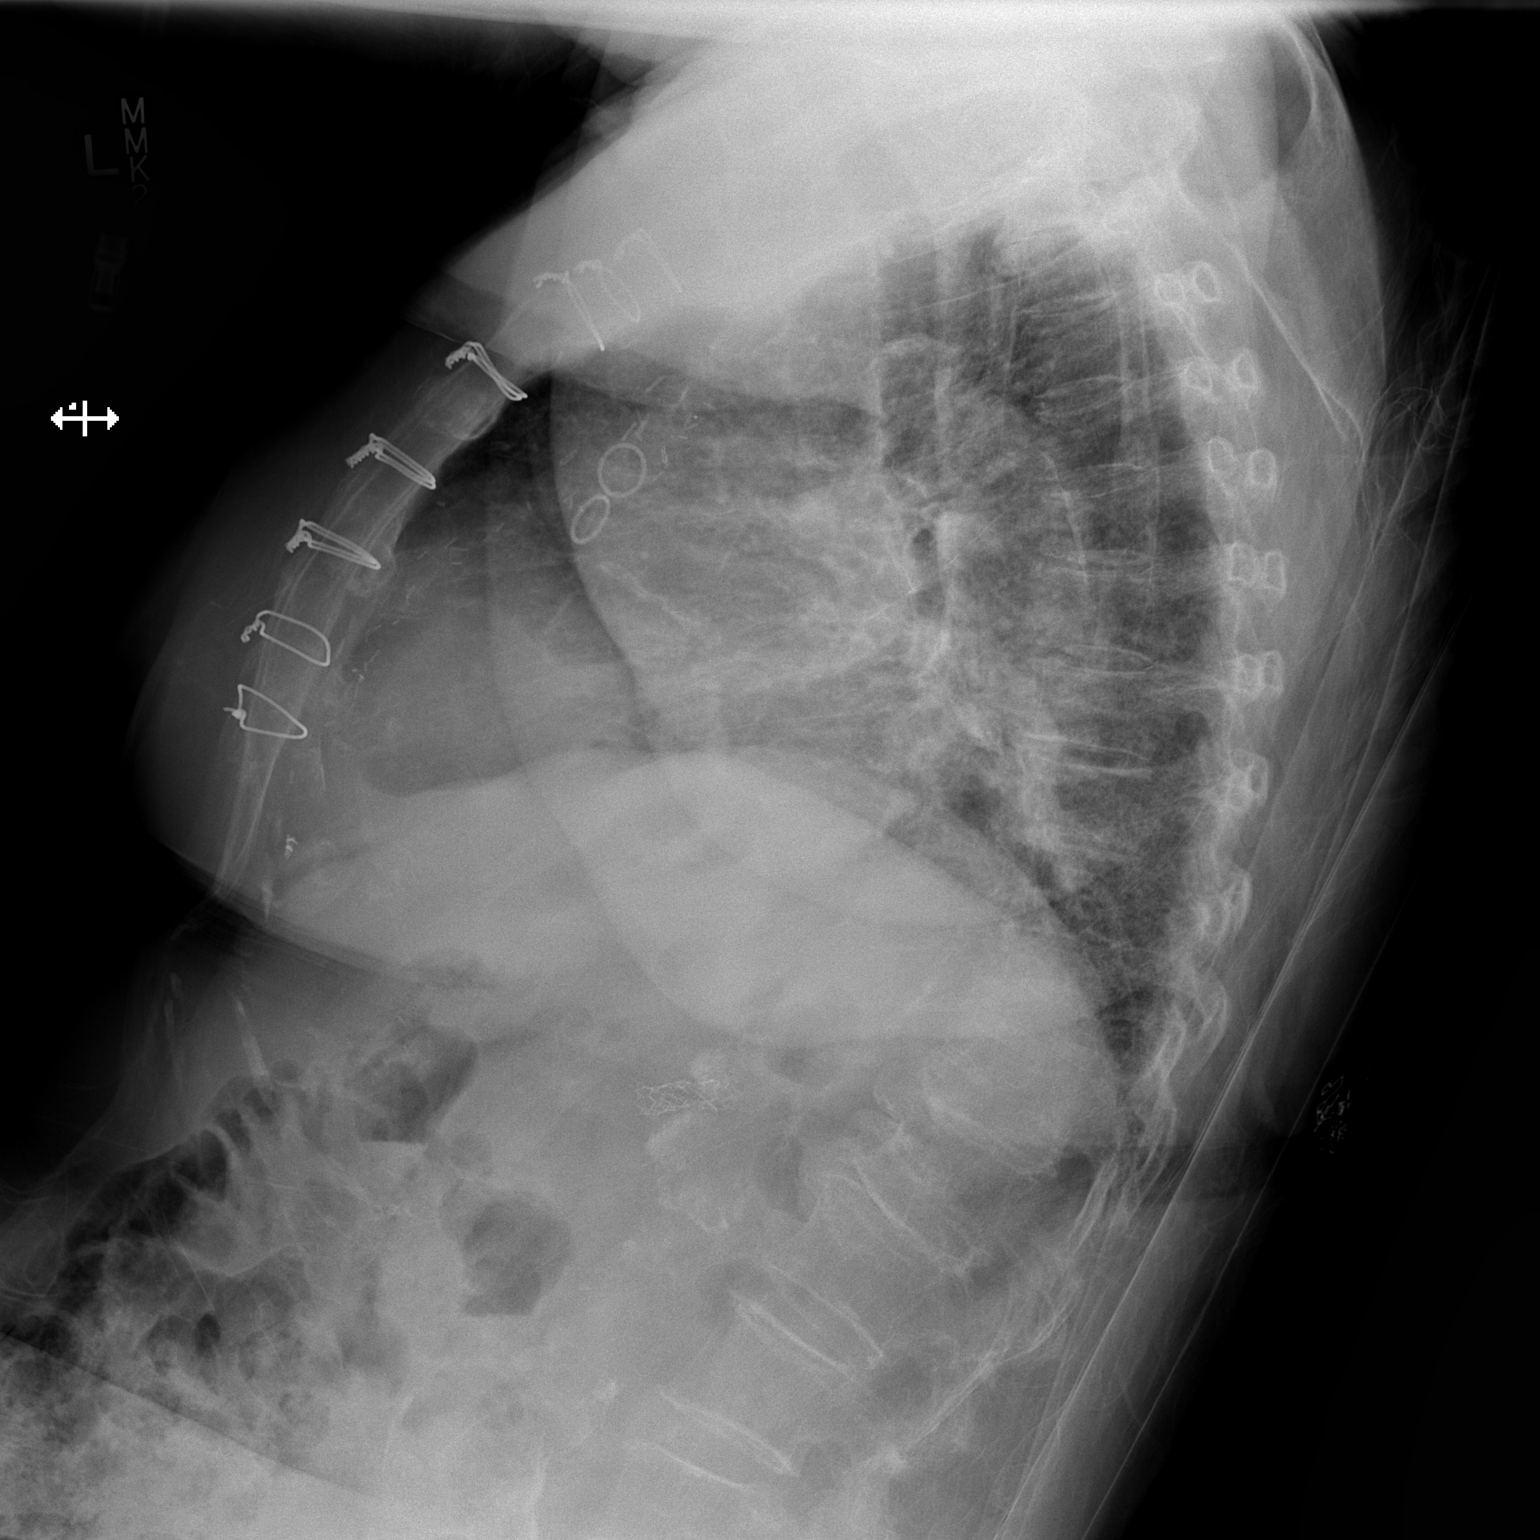

[x chest ap]
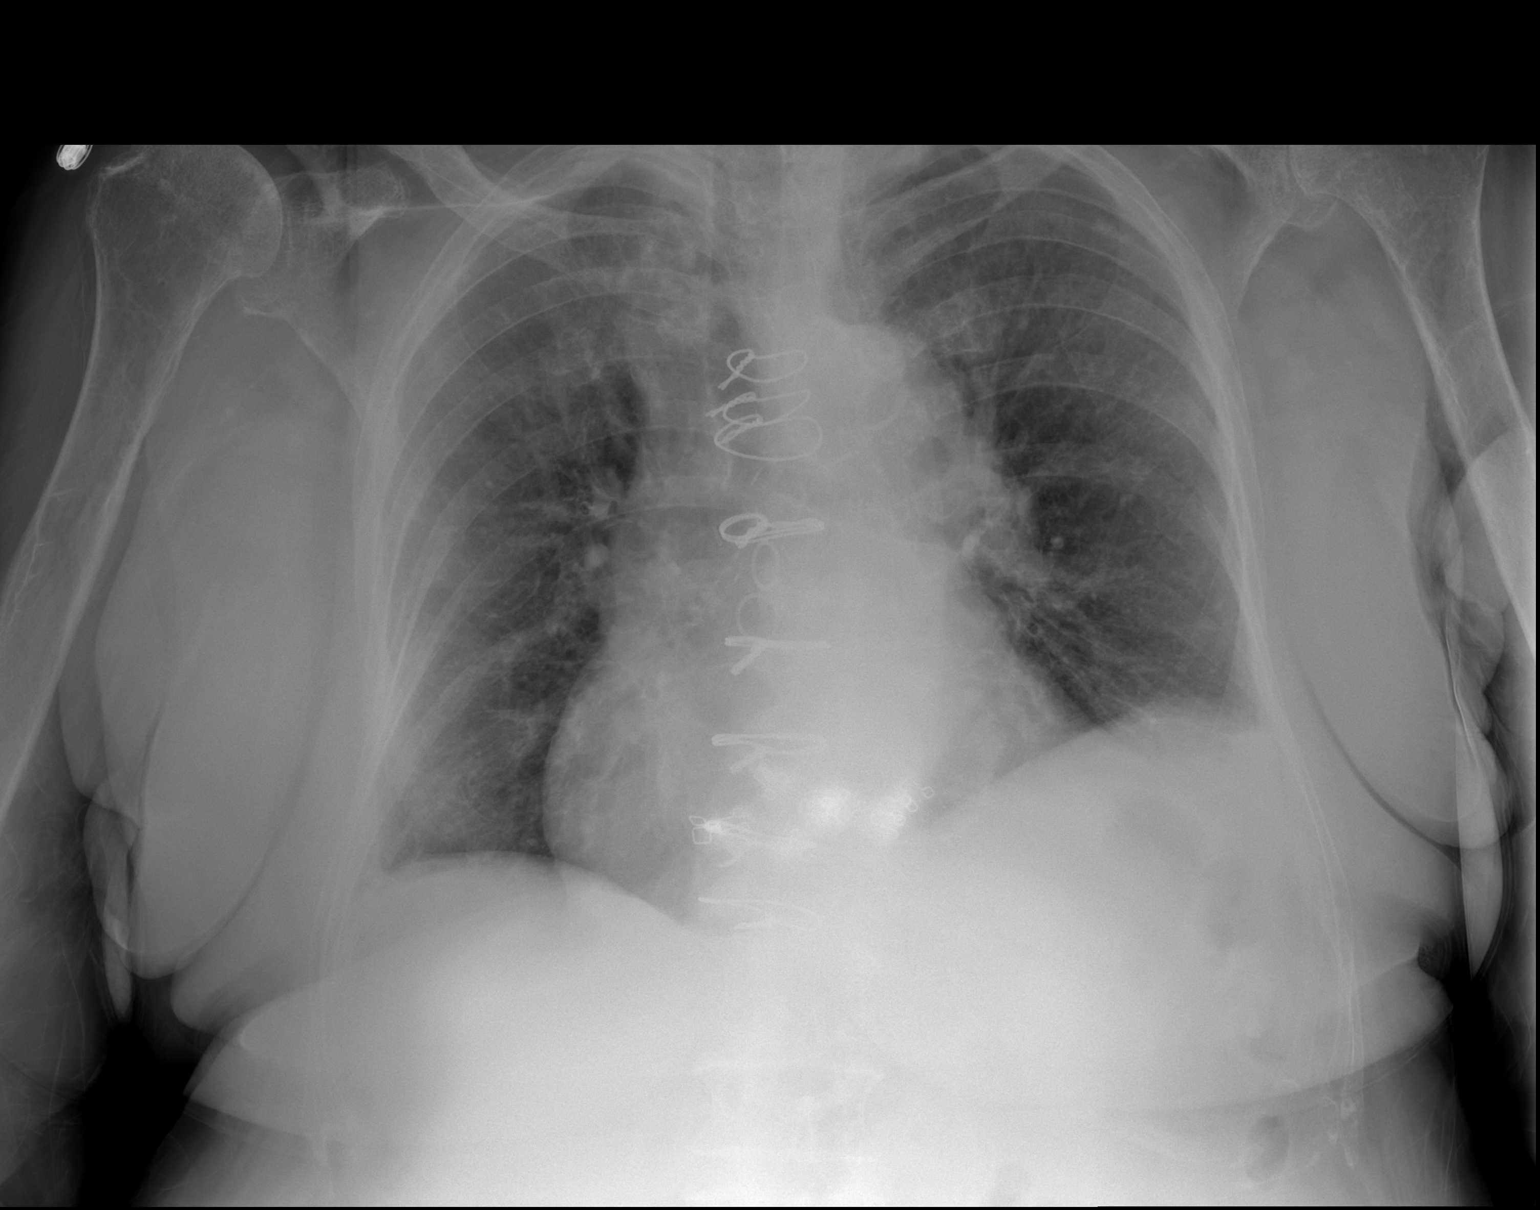

[2 of 2 positions shown; findings below may reference images not displayed]

FINDINGS: Post median sternotomy. Stable cardiomegaly and mediastinal contours
with tortuosity and atherosclerosis of thoracic aorta. Chronic
elevation of left hemidiaphragm. No pulmonary edema. Chronic
bronchial thickening. No confluent airspace disease, pleural
effusion or pneumothorax. The bones are under mineralized.
IMPRESSION: 1. Stable cardiomegaly.  Unchanged tortuous atherosclerotic aorta.
2. Chronic bronchial thickening. Chronic elevation of left
hemidiaphragm.
3. No acute abnormality.

## 2016-10-20 MED ORDER — DOCUSATE SODIUM 100 MG PO CAPS
100.0000 mg | ORAL_CAPSULE | Freq: Two times a day (BID) | ORAL | Status: DC
  Administered 2016-10-21 – 2016-10-24 (×7): 100 mg via ORAL
  Filled 2016-10-20 (×7): qty 1

## 2016-10-20 MED ORDER — TRAMADOL HCL 50 MG PO TABS
50.0000 mg | ORAL_TABLET | Freq: Two times a day (BID) | ORAL | Status: DC | PRN
  Administered 2016-10-21 – 2016-10-23 (×2): 50 mg via ORAL
  Filled 2016-10-20 (×2): qty 1

## 2016-10-20 MED ORDER — PANTOPRAZOLE SODIUM 40 MG PO TBEC
40.0000 mg | DELAYED_RELEASE_TABLET | Freq: Every day | ORAL | Status: DC
  Administered 2016-10-21 – 2016-10-24 (×4): 40 mg via ORAL
  Filled 2016-10-20 (×4): qty 1

## 2016-10-20 MED ORDER — POLYSACCHARIDE IRON COMPLEX 150 MG PO CAPS
150.0000 mg | ORAL_CAPSULE | Freq: Every day | ORAL | Status: DC
  Administered 2016-10-21 – 2016-10-24 (×4): 150 mg via ORAL
  Filled 2016-10-20 (×4): qty 1

## 2016-10-20 MED ORDER — ACETAMINOPHEN 650 MG RE SUPP: 650.0000 mg | Freq: Four times a day (QID) | RECTAL | Status: DC | PRN

## 2016-10-20 MED ORDER — METOPROLOL SUCCINATE ER 25 MG PO TB24
25.0000 mg | ORAL_TABLET | Freq: Every day | ORAL | Status: DC
  Administered 2016-10-21 – 2016-10-24 (×4): 25 mg via ORAL
  Filled 2016-10-20 (×4): qty 1

## 2016-10-20 MED ORDER — LISINOPRIL 20 MG PO TABS
20.0000 mg | ORAL_TABLET | Freq: Every day | ORAL | Status: DC
  Administered 2016-10-21 – 2016-10-22 (×2): 20 mg via ORAL
  Filled 2016-10-20 (×2): qty 1

## 2016-10-20 MED ORDER — ONDANSETRON HCL 4 MG/2ML IJ SOLN
4.0000 mg | Freq: Four times a day (QID) | INTRAMUSCULAR | Status: DC | PRN
Start: 2016-10-20 — End: 2016-10-24

## 2016-10-20 MED ORDER — DILTIAZEM HCL 100 MG IV SOLR
5.0000 mg/h | INTRAVENOUS | Status: DC
  Administered 2016-10-20: 5 mg/h via INTRAVENOUS
  Administered 2016-10-21: 10 mg/h via INTRAVENOUS
  Filled 2016-10-20 (×2): qty 100

## 2016-10-20 MED ORDER — CLONAZEPAM 0.5 MG PO TABS
0.5000 mg | ORAL_TABLET | Freq: Every day | ORAL | Status: DC
  Administered 2016-10-21 – 2016-10-23 (×4): 0.5 mg via ORAL
  Filled 2016-10-20 (×3): qty 1

## 2016-10-20 MED ORDER — ONDANSETRON HCL 4 MG PO TABS: 4.0000 mg | ORAL_TABLET | Freq: Four times a day (QID) | ORAL | Status: DC | PRN

## 2016-10-20 MED ORDER — MORPHINE SULFATE (PF) 4 MG/ML IV SOLN
4.0000 mg | Freq: Once | INTRAVENOUS | Status: AC
  Administered 2016-10-20: 4 mg via INTRAVENOUS
  Filled 2016-10-20: qty 1

## 2016-10-20 MED ORDER — ADULT MULTIVITAMIN W/MINERALS CH
1.0000 | ORAL_TABLET | Freq: Every day | ORAL | Status: DC
  Administered 2016-10-21 – 2016-10-24 (×4): 1 via ORAL
  Filled 2016-10-20 (×4): qty 1

## 2016-10-20 MED ORDER — DILTIAZEM LOAD VIA INFUSION
10.0000 mg | Freq: Once | INTRAVENOUS | Status: AC
  Administered 2016-10-20: 10 mg via INTRAVENOUS
  Filled 2016-10-20: qty 10

## 2016-10-20 MED ORDER — TORSEMIDE 20 MG PO TABS
50.0000 mg | ORAL_TABLET | Freq: Every day | ORAL | Status: DC
  Administered 2016-10-21: 50 mg via ORAL
  Filled 2016-10-20: qty 3

## 2016-10-20 MED ORDER — PRAVASTATIN SODIUM 40 MG PO TABS
40.0000 mg | ORAL_TABLET | Freq: Every day | ORAL | Status: DC
  Administered 2016-10-21 – 2016-10-24 (×4): 40 mg via ORAL
  Filled 2016-10-20 (×4): qty 1

## 2016-10-20 MED ORDER — LEFLUNOMIDE 20 MG PO TABS
10.0000 mg | ORAL_TABLET | Freq: Every day | ORAL | Status: DC
  Administered 2016-10-21 – 2016-10-23 (×4): 10 mg via ORAL
  Filled 2016-10-20 (×4): qty 1

## 2016-10-20 MED ORDER — DILTIAZEM HCL ER COATED BEADS 240 MG PO CP24
240.0000 mg | ORAL_CAPSULE | Freq: Every day | ORAL | Status: DC
  Administered 2016-10-21 – 2016-10-23 (×4): 240 mg via ORAL
  Filled 2016-10-20 (×4): qty 1

## 2016-10-20 MED ORDER — LEVOTHYROXINE SODIUM 50 MCG PO TABS
50.0000 ug | ORAL_TABLET | Freq: Every day | ORAL | Status: DC
  Administered 2016-10-21 – 2016-10-24 (×4): 50 ug via ORAL
  Filled 2016-10-20 (×4): qty 1

## 2016-10-20 MED ORDER — ACETAMINOPHEN 325 MG PO TABS: 650.0000 mg | ORAL_TABLET | Freq: Four times a day (QID) | ORAL | Status: DC | PRN

## 2016-10-20 MED ORDER — ASPIRIN EC 81 MG PO TBEC
81.0000 mg | DELAYED_RELEASE_TABLET | Freq: Every day | ORAL | Status: DC
  Administered 2016-10-21 – 2016-10-24 (×4): 81 mg via ORAL
  Filled 2016-10-20 (×4): qty 1

## 2016-10-20 MED ORDER — FUROSEMIDE 10 MG/ML IJ SOLN
40.0000 mg | INTRAMUSCULAR | Status: AC
  Administered 2016-10-20: 40 mg via INTRAVENOUS
  Filled 2016-10-20: qty 4

## 2016-10-20 NOTE — ED Notes (Signed)
Patient transported to X-ray 

## 2016-10-20 NOTE — ED Provider Notes (Signed)
[facility]-EMERGENCY DEPT Provider Note   CSN: [id number] Arrival date & time: 10/20/16  1700     History   Chief Complaint Chief Complaint  Patient presents with  . Leg Pain    HPI [name] is a 83 y.o. female.  [name] is a 83 y.o. Female with a history of diabetes, hypertension, CHF, A. fib on Coumadin, DVT and coronary artery disease who presents to the emergency department complaining of bilateral leg pain ongoing since yesterday as well as shortness of breath. Patient also reports she's been taking a gram of Tylenol every 2 hours for the past 24 hours. She reports she has most of her pain in her knees and then into her bilateral calves. She reports pain is worse on both sides and is not worse with movement. She is unable to identify aggravating factors. She does have a history of a DVT previously. She is compliant with her Coumadin. She denies any worsening leg swelling, she does report she feels like her knees are slightly more swollen. She denies any injury or trauma to her knees. She also reports feeling short of breath at rest over the past 24 hours. She denies any chest pain. She denies fevers, coughing, chest pain, numbness, tingling, weakness, abdominal pain, nausea, vomiting, diarrhea or rashes.   The history is provided by the patient, a relative and medical records. No language interpreter was used.  Leg Pain      Past Medical History:  Diagnosis Date  . Acute pancreatitis   . Anemia   . Arthritis   . Atrial fibrillation (HCC)   . CAD (coronary artery disease)   . Carotid artery occlusion   . CHF (congestive heart failure) (HCC)   . Diabetes mellitus age 43  . DJD (degenerative joint disease)   . DVT (deep venous thrombosis) (HCC)   . GERD (gastroesophageal reflux disease)   . GI bleed   . Hypertension   . Joint pain   . Mesenteric ischemia   . Peripheral vascular disease (HCC)   . Thyroid disease     Patient Active Problem List   Diagnosis  Date Noted  . Atrial fibrillation with RVR (HCC) 10/20/2016  . Grief at loss of child 10/21/2015  . Chronic kidney disease, stage IV (severe) (HCC) 04/14/2015  . Colonic constipation 04/14/2015  . Diabetes mellitus with renal complications (HCC) 03/03/2015  . Atrophic vaginitis 02/24/2015  . Rheumatoid arthritis (HCC) 02/18/2015  . DNR (do not resuscitate) 02/13/2015  . Occlusion and stenosis of carotid artery without mention of cerebral infarction 05/02/2013  . 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 FEMORAL BRUIT 11/24/2009  . Hypothyroidism 06/26/2009  . CEREBROVASCULAR DISEASE 06/25/2009  . ATRIAL FIBRILLATION 05/19/2008  . CHRONIC DIASTOLIC HEART FAILURE 05/19/2008  . RENAL ARTERY STENOSIS 05/19/2008  . PERIPHERAL VASCULAR DISEASE 05/19/2008    Past Surgical History:  Procedure Laterality Date  . APPENDECTOMY    . CELIAC ARTERY STENT    . CORONARY ARTERY BYPASS GRAFT    . EMBOLECTOMY  2009   right leg  . FASCIOTOMY     right leg  . HIP FRACTURE SURGERY Left   . JOINT REPLACEMENT    . TOTAL KNEE ARTHROPLASTY     bilateral  . TUBAL LIGATION    . VISCERAL ANGIOGRAM N/A 05/31/2013   Procedure: MESSENTERIC [name];  Surgeon: [name], MD;  Location: [facility] CATH LAB;  Service: Cardiovascular;  Laterality: N/A;    OB History    No  data available       Home Medications    Prior to Admission medications   Medication Sig Start Date End Date Taking? Authorizing Provider  alendronate (FOSAMAX) 70 MG tablet Take by mouth.    [provider]  aspirin 81 MG EC tablet TAKE ONE TABLET BY MOUTH EVERY DAY 09/11/15   Rodolph Bong, MD  Calcium Carb-Cholecalciferol 600-800 MG-UNIT TABS TAKE ONE TABLET BY MOUTH 2 TIMES A DAY 09/11/15   Rodolph Bong, MD  clonazePAM (KLONOPIN) 0.5 MG tablet TAKE 1 TABLET BY MOUTH AT BEDTIME 10/17/16   Rodolph Bong, MD  diltiazem (CARDIZEM CD) 240 MG 24 hr capsule TAKE 1 CAPSULE BY MOUTH AT BEDTIME 10/14/16   Rodolph Bong, MD    Ergocalciferol (VITAMIN D2) 2000 units TABS Take by mouth.    [provider]  FERREX 150 150 MG capsule TAKE ONE CAPSULE BY MOUTH EVERY DAY 09/11/15   Rodolph Bong, MD  glucose blood test strip Once daily E11.29 03/03/15   Rodolph Bong, MD  leflunomide (ARAVA) 10 MG tablet TAKE 1 TABLET BY MOUTH AT BEDTIME 10/03/16   Rodolph Bong, MD  levothyroxine (SYNTHROID, LEVOTHROID) 50 MCG tablet Take 50 mcg by mouth daily.      [provider]  lisinopril (PRINIVIL,ZESTRIL) 20 MG tablet TAKE 1 TABLET BY MOUTH ONCE DAILY 03/02/16   Rodolph Bong, MD  metoprolol succinate (TOPROL-XL) 25 MG 24 hr tablet TAKE 1 TABLET BY MOUTH ONCE DAILY 04/18/16   Rodolph Bong, MD  Multiple Vitamin (MULTIVITAMIN WITH MINERALS) TABS tablet Take 1 tablet by mouth daily.    [provider]  pantoprazole (PROTONIX) 40 MG tablet TAKE 1 TABLET BY MOUTH ONCE DAILY 03/02/16   Rodolph Bong, MD  pravastatin (PRAVACHOL) 40 MG tablet TAKE 1 TABLET BY MOUTH ONCE DAILY 06/23/16   Rodolph Bong, MD  predniSONE (DELTASONE) 5 MG tablet Take 1-2 tablets (5-10 mg total) by mouth 2 (two) times daily as needed (Rheumatoid flair). 07/26/16   Rodolph Bong, MD  STOOL SOFTENER 100 MG capsule TAKE 1 CAPSULE BY MOUTH 2 TIMES A DAY (AM & PM) 09/11/15   Rodolph Bong, MD  torsemide (DEMADEX) 100 MG tablet TAKE 1/2 TABLET BY MOUTH ONCE DAILY 10/14/16   Rodolph Bong, MD  traMADol (ULTRAM) 50 MG tablet TAKE 1 TABLET BY MOUTH TWICE A DAY TAKE 1 TABLET BY MOUTH TWICE A DAYAS NEEDED FOR PAIN 08/30/16   Rodolph Bong, MD  warfarin (COUMADIN) 3 MG tablet TAKE 1 TABLET BY MOUTH ONCE A DAY 10/14/16   Rodolph Bong, MD    Family History Family History  Problem Relation Age of Onset  . Cancer Mother        ovarian  . Other Father        suicide  . Coronary artery disease Sister   . Other Sister        alzheimers  . Heart disease Sister        Before age 73  . Diabetes Sister   . Cancer Daughter        spinal tumor  . Diabetes  Daughter   . Hypertension Daughter   . Diabetes Son   . Hyperlipidemia Son   . Hypertension Son     Social History Social History  Substance Use Topics  . Smoking status: Never Smoker  . Smokeless tobacco: Never Used  . Alcohol use No     Allergies  Cefuroxime and Cephalexin   Review of Systems Review of Systems  Constitutional: Negative for chills and fever.  HENT: Negative for congestion and sore throat.   Eyes: Negative for visual disturbance.  Respiratory: Positive for shortness of breath. Negative for cough and wheezing.   Cardiovascular: Negative for chest pain and palpitations.  Gastrointestinal: Negative for abdominal pain, diarrhea, nausea and vomiting.  Genitourinary: Negative for dysuria.  Musculoskeletal: Positive for arthralgias and joint swelling. Negative for back pain and neck pain.  Skin: Negative for rash.  Neurological: Negative for dizziness, weakness, light-headedness and headaches.     Physical Exam Updated Vital Signs BP 134/90   Pulse (!) 114   Temp 98.4 F (36.9 C) (Oral)   Resp 17   Ht 5\' 2"  (1.575 m)   Wt 71.2 kg (157 lb)   SpO2 95%   BMI 28.72 kg/m   Physical Exam  Constitutional: She appears well-developed and well-nourished. No distress.  Nontoxic appearing.  HENT:  Head: Normocephalic and atraumatic.  Mouth/Throat: Oropharynx is clear and moist.  Eyes: Pupils are equal, round, and reactive to light. Conjunctivae are normal. Right eye exhibits no discharge. Left eye exhibits no discharge.  Neck: Normal range of motion. Neck supple. No JVD present.  Cardiovascular: Normal rate, regular rhythm, normal heart sounds and intact distal pulses.  Exam reveals no gallop and no friction rub.   No murmur heard. Bilateral radial, posterior tibialis and dorsalis pedis pulses are intact.    Pulmonary/Chest: Effort normal. No stridor. No respiratory distress. She has no wheezes. She has rales.  Crackles noted to her bilateral bases. No  increased work of breathing.  Abdominal: Soft. There is no tenderness. There is no guarding.  Abdomen is soft and nontender to palpation.  Musculoskeletal: Normal range of motion. She exhibits tenderness. She exhibits no edema or deformity.  Tenderness noted to her bilateral calves as well as her bilateral knees. Very slight ankle edema noted. No calf edema noted. She is mild swelling noted to her bilateral knees without deformity, or ecchymosis. Bilateral dorsalis pedis and posterior tibialis pulses are intact and equal. Good range of motion of her bilateral knees without difficulty. No overlying skin changes.  Lymphadenopathy:    She has no cervical adenopathy.  Neurological: She is alert. No sensory deficit. Coordination normal.  Skin: Skin is warm and dry. Capillary refill takes less than 2 seconds. No rash noted. She is not diaphoretic. No erythema. No pallor.  Psychiatric: She has a normal mood and affect. Her behavior is normal.  Nursing note and vitals reviewed.    ED Treatments / Results  Labs (all labs ordered are listed, but only abnormal results are displayed) Labs Reviewed  COMPREHENSIVE METABOLIC PANEL - Abnormal; Notable for the following:       Result Value   Glucose, Bld 128 (*)    Creatinine, Ser 1.02 (*)    Albumin 2.9 (*)    Total Bilirubin 1.3 (*)    GFR calc non Af Amer 49 (*)    GFR calc Af Amer 57 (*)    All other components within normal limits  CBC WITH DIFFERENTIAL/PLATELET - Abnormal; Notable for the following:    Hemoglobin 11.9 (*)    RDW 19.4 (*)    All other components within normal limits  BRAIN NATRIURETIC PEPTIDE - Abnormal; Notable for the following:    B Natriuretic Peptide 345.8 (*)    All other components within normal limits  PROTIME-INR - Abnormal; Notable for the following:  Prothrombin Time 26.0 (*)    All other components within normal limits  ACETAMINOPHEN LEVEL  VITAMIN B12  BASIC METABOLIC PANEL  Beverlee Nims,  ED    EKG  EKG Interpretation  Date/Time:  Thursday October 20 2016 21:28:33 EDT Ventricular Rate:  137 PR Interval:    QRS Duration: 89 QT Interval:  299 QTC Calculation: 452 R Axis:   63 Text Interpretation:  Atrial fibrillation ST depression, probably rate related Confirmed by Margarita Grizzle [phone]) on 10/20/2016 10:35:22 PM       Radiology Dg Chest 2 View  Result Date: 10/20/2016 CLINICAL DATA:  Shortness of breath. Bilateral leg pain and swelling. EXAM: CHEST  2 VIEW COMPARISON:  05/17/2016 FINDINGS: Post median sternotomy. Stable cardiomegaly and mediastinal contours with tortuosity and atherosclerosis of thoracic aorta. Chronic elevation of left hemidiaphragm. No pulmonary edema. Chronic bronchial thickening. No confluent airspace disease, pleural effusion or pneumothorax. The bones are under mineralized. IMPRESSION: 1. Stable cardiomegaly.  Unchanged tortuous atherosclerotic aorta. 2. Chronic bronchial thickening. Chronic elevation of left hemidiaphragm. 3. No acute abnormality. Electronically Signed   By: Rubye Oaks M.D.   On: 10/20/2016 21:20    Procedures Procedures (including critical care time)  CRITICAL CARE Performed by: Lawana Chambers   Total critical care time: 45 minutes  Critical care time was exclusive of separately billable procedures and treating other patients.  Critical care was necessary to treat or prevent imminent or life-threatening deterioration.  Critical care was time spent personally by me on the following activities: development of treatment plan with patient and/or surrogate as well as nursing, discussions with consultants, evaluation of patient's response to treatment, examination of patient, obtaining history from patient or surrogate, ordering and performing treatments and interventions, ordering and review of laboratory studies, ordering and review of radiographic studies, pulse oximetry and re-evaluation of patient's condition.    Medications Ordered in ED Medications  diltiazem (CARDIZEM) 1 mg/mL load via infusion 10 mg (10 mg Intravenous Bolus from Bag 10/20/16 2317)    And  diltiazem (CARDIZEM) 100 mg in dextrose 5 % 100 mL (1 mg/mL) infusion (5 mg/hr Intravenous New Bag/Given 10/20/16 2316)  diltiazem (CARDIZEM CD) 24 hr capsule 240 mg (not administered)  clonazePAM (KLONOPIN) tablet 0.5 mg (not administered)  aspirin EC tablet 81 mg (not administered)  iron polysaccharides (NIFEREX) capsule 150 mg (not administered)  lisinopril (PRINIVIL,ZESTRIL) tablet 20 mg (not administered)  levothyroxine (SYNTHROID, LEVOTHROID) tablet 50 mcg (not administered)  metoprolol succinate (TOPROL-XL) 24 hr tablet 25 mg (not administered)  pravastatin (PRAVACHOL) tablet 40 mg (not administered)  docusate sodium (COLACE) capsule 100 mg (not administered)  torsemide (DEMADEX) tablet 50 mg (not administered)  traMADol (ULTRAM) tablet 50 mg (not administered)  pantoprazole (PROTONIX) EC tablet 40 mg (not administered)  multivitamin with minerals tablet 1 tablet (not administered)  leflunomide (ARAVA) tablet 10 mg (not administered)  ondansetron (ZOFRAN) tablet 4 mg (not administered)    Or  ondansetron (ZOFRAN) injection 4 mg (not administered)  Warfarin - Pharmacist Dosing Inpatient (not administered)  warfarin (COUMADIN) tablet 3 mg (not administered)  furosemide (LASIX) injection 40 mg (40 mg Intravenous Given 10/20/16 2307)  morphine 4 MG/ML injection 4 mg (4 mg Intravenous Given 10/20/16 2315)     Initial Impression / Assessment and Plan / ED Course  I have reviewed the triage vital signs and the nursing notes.  Pertinent labs & imaging results that were available during my care of the patient were reviewed by me and considered in  my medical decision making (see chart for details).    This is a 83 y.o. Female with a history of diabetes, hypertension, CHF, A. fib on Coumadin, DVT and coronary artery disease who presents to  the emergency department complaining of bilateral leg pain ongoing since yesterday as well as shortness of breath. Patient also reports she's been taking a gram of Tylenol every 2 hours for the past 24 hours. She reports she has most of her pain in her knees and then into her bilateral calves. She reports pain is worse on both sides and is not worse with movement. She is unable to identify aggravating factors. She does have a history of a DVT previously. She is compliant with her Coumadin. She denies any worsening leg swelling, she does report she feels like her knees are slightly more swollen. She denies any injury or trauma to her knees. She also reports feeling short of breath at rest over the past 24 hours. She denies any chest pain.   Later, patient also tells me she did not take her medications yesterday evening or today. Initially, patient has A. fib on the monitor and has a rate between 80 and 104 while I'm examining the patient. Later I evaluated the patient and her heart rate is 155. She is in A. fib RVR. We'll start Cardizem.  She has crackles on lung exam. Suspect she has fluid overload. This may be contributing to her leg edema which is causing pain. Troponin is not elevated. CMP shows improved kidney function from her baseline. Normal liver function testing. Acetaminophen level is 15. She last had Tylenol at 4 PM.  I consulted with poison control who reports she does not have a toxic acetaminophen level. We are reassured by her normal liver enzymes as well. Suspect patient did not actually take this much Tylenol over the last 24 hours as she suspects.  X-ray shows stable cardiomegaly. No acute abnormality.  INR is therapeutic at 2.33.  We'll plan for admission for CHF exacerbation and A. fib RVR. Patient not Cardizem. On reevaluation she has improving heart rate and maintaining a good blood pressure. Will admit to medicine. Patient and family agrees for plan for admission.  I consulted  with [facility] Dr. Julian Reil who accepted the patient for admission.  This patient was discussed with Dr. Rosalia Hammers who agrees with assessment and plan.    Final Clinical Impressions(s) / ED Diagnoses   Final diagnoses:  Atrial fibrillation with RVR (HCC)  Acute on chronic systolic congestive heart failure (HCC)  Bilateral leg pain    New Prescriptions New Prescriptions   No medications on file     Everlene Farrier, Cordelia Poche 10/21/16 0114    Margarita Grizzle, MD 10/21/16 [phone]

## 2016-10-20 NOTE — Progress Notes (Addendum)
ANTICOAGULATION CONSULT NOTE - Initial Consult  Pharmacy Consult for Coumadin Indication: atrial fibrillation  Allergies  Allergen Reactions  . Cefuroxime Anaphylaxis    Throat swelling  . Cephalexin Rash   Patient Measurements: Height: 5\' 2"  (157.5 cm) Weight: 157 lb (71.2 kg) IBW/kg (Calculated) : 50.1  Assessment: 83 yo F presents with bilateral leg pain and swelling.  INR on admit is therapeutic at 2.33. Last dose was on 7/11. Hgb 11.9, plts wnl.  Goal of Therapy:  INR 2-3 Monitor platelets by anticoagulation protocol: Yes   Plan:  Give Coumadin 3 mg PO x 1 tonight Monitor daily INR, CBC, s/s of bleed   9/11, PharmD, [facility] Clinical Pharmacist Pager [phone] 10/21/2016 12:23 AM

## 2016-10-20 NOTE — H&P (Signed)
History and Physical    [name] CZY:[id number] DOB: 20-Dec-1929 DOA: 10/20/2016  PCP: Rodolph Bong, MD  Patient coming from: Home  I have personally briefly reviewed patient's old medical records in [facility]  Chief Complaint: B leg pain  HPI: [name] is a 83 y.o. female with medical history significant of A.Fib rate controlled on cardizem and on coumadin, CHF, DM2, HTN, peripheral neuropathy listed on chart as diabetic peripheral neuropathy, this also associated with diabetic foot ulcers.  She has had leg pain over the past 24 hours or so.  Located in B calfs.  Patient has been taking about a gram of tylenol every 2 hours for the past couple of days without relief of leg pain.  She has had ongoing leg pain since yesterday as well as SOB.  She says because she wasn't feeling well, she didn't take her Torsemide today, and didn't take her cardizem today either.  No fevers, cough, CP.   ED Course: Patient developed A.Fib RVR, crackles on ascultation.  A.Fib being rate controlled with cardizem gtt.  Ironically enough her leg pain has spontaneously resolved at this point.   Review of Systems: As per HPI otherwise 10 point review of systems negative.   Past Medical History:  Diagnosis Date  . Acute pancreatitis   . Anemia   . Arthritis   . Atrial fibrillation (HCC)   . CAD (coronary artery disease)   . Carotid artery occlusion   . CHF (congestive heart failure) (HCC)   . Diabetes mellitus age 39  . DJD (degenerative joint disease)   . DVT (deep venous thrombosis) (HCC)   . GERD (gastroesophageal reflux disease)   . GI bleed   . Hypertension   . Joint pain   . Mesenteric ischemia   . Peripheral vascular disease (HCC)   . Thyroid disease     Past Surgical History:  Procedure Laterality Date  . APPENDECTOMY    . CELIAC ARTERY STENT    . CORONARY ARTERY BYPASS GRAFT    . EMBOLECTOMY  2009   right leg  . FASCIOTOMY     right leg  . HIP FRACTURE  SURGERY Left   . JOINT REPLACEMENT    . TOTAL KNEE ARTHROPLASTY     bilateral  . TUBAL LIGATION    . VISCERAL ANGIOGRAM N/A 05/31/2013   Procedure: MESSENTERIC [name];  Surgeon: [name], MD;  Location: [facility] CATH LAB;  Service: Cardiovascular;  Laterality: N/A;     reports that she has never smoked. She has never used smokeless tobacco. She reports that she does not drink alcohol or use drugs.  Allergies  Allergen Reactions  . Cefuroxime Anaphylaxis    Throat swelling  . Cephalexin Rash    Family History  Problem Relation Age of Onset  . Cancer Mother        ovarian  . Other Father        suicide  . Coronary artery disease Sister   . Other Sister        alzheimers  . Heart disease Sister        Before age 47  . Diabetes Sister   . Cancer Daughter        spinal tumor  . Diabetes Daughter   . Hypertension Daughter   . Diabetes Son   . Hyperlipidemia Son   . Hypertension Son      Prior to Admission medications   Medication Sig Start Date End Date  Taking? Authorizing Provider  alendronate (FOSAMAX) 70 MG tablet Take by mouth.    [provider]  aspirin 81 MG EC tablet TAKE ONE TABLET BY MOUTH EVERY DAY 09/11/15   Rodolph Bong, MD  Calcium Carb-Cholecalciferol 600-800 MG-UNIT TABS TAKE ONE TABLET BY MOUTH 2 TIMES A DAY 09/11/15   Rodolph Bong, MD  clonazePAM (KLONOPIN) 0.5 MG tablet TAKE 1 TABLET BY MOUTH AT BEDTIME 10/17/16   Rodolph Bong, MD  diltiazem (CARDIZEM CD) 240 MG 24 hr capsule TAKE 1 CAPSULE BY MOUTH AT BEDTIME 10/14/16   Rodolph Bong, MD  Ergocalciferol (VITAMIN D2) 2000 units TABS Take by mouth.    [provider]  FERREX 150 150 MG capsule TAKE ONE CAPSULE BY MOUTH EVERY DAY 09/11/15   Rodolph Bong, MD  glucose blood test strip Once daily E11.29 03/03/15   Rodolph Bong, MD  leflunomide (ARAVA) 10 MG tablet TAKE 1 TABLET BY MOUTH AT BEDTIME 10/03/16   Rodolph Bong, MD  levothyroxine (SYNTHROID, LEVOTHROID) 50 MCG tablet Take 50 mcg  by mouth daily.      [provider]  lisinopril (PRINIVIL,ZESTRIL) 20 MG tablet TAKE 1 TABLET BY MOUTH ONCE DAILY 03/02/16   Rodolph Bong, MD  metoprolol succinate (TOPROL-XL) 25 MG 24 hr tablet TAKE 1 TABLET BY MOUTH ONCE DAILY 04/18/16   Rodolph Bong, MD  Multiple Vitamin (MULTIVITAMIN WITH MINERALS) TABS tablet Take 1 tablet by mouth daily.    [provider]  pantoprazole (PROTONIX) 40 MG tablet TAKE 1 TABLET BY MOUTH ONCE DAILY 03/02/16   Rodolph Bong, MD  pravastatin (PRAVACHOL) 40 MG tablet TAKE 1 TABLET BY MOUTH ONCE DAILY 06/23/16   Rodolph Bong, MD  predniSONE (DELTASONE) 5 MG tablet Take 1-2 tablets (5-10 mg total) by mouth 2 (two) times daily as needed (Rheumatoid flair). 07/26/16   Rodolph Bong, MD  STOOL SOFTENER 100 MG capsule TAKE 1 CAPSULE BY MOUTH 2 TIMES A DAY (AM & PM) 09/11/15   Rodolph Bong, MD  torsemide (DEMADEX) 100 MG tablet TAKE 1/2 TABLET BY MOUTH ONCE DAILY 10/14/16   Rodolph Bong, MD  traMADol (ULTRAM) 50 MG tablet TAKE 1 TABLET BY MOUTH TWICE A DAY TAKE 1 TABLET BY MOUTH TWICE A DAYAS NEEDED FOR PAIN 08/30/16   Rodolph Bong, MD  warfarin (COUMADIN) 3 MG tablet TAKE 1 TABLET BY MOUTH ONCE A DAY 10/14/16   Rodolph Bong, MD    Physical Exam: Vitals:   10/20/16 2300 10/20/16 2302 10/20/16 2315 10/20/16 2330  BP:  (!) 168/98  139/85  Pulse: 88 (!) 48 77 (!) 110  Resp: (!) 22 14 15 15   Temp:      TempSrc:      SpO2: 97% 99% 97% 96%  Weight:      Height:        Constitutional: NAD, calm, comfortable Eyes: PERRL, lids and conjunctivae normal ENMT: Mucous membranes are moist. Posterior pharynx clear of any exudate or lesions.Normal dentition.  Neck: normal, supple, no masses, no thyromegaly Respiratory: clear to auscultation bilaterally, no wheezing, no crackles. Normal respiratory effort. No accessory muscle use.  Cardiovascular: Regular rate and rhythm, no murmurs / rubs / gallops. No extremity edema. 2+ pedal pulses. No carotid bruits.    Abdomen: no tenderness, no masses palpated. No hepatosplenomegaly. Bowel sounds positive.  Musculoskeletal: no clubbing / cyanosis. No joint deformity upper and lower extremities. Good ROM, no contractures. Normal muscle tone.  Skin: uninfected pressure ulcers on balls of feet bilaterally. Neurologic: CN 2-12 grossly intact. Sensation intact, DTR normal. Strength 5/5 in all 4.  Psychiatric: Normal judgment and insight. Alert and oriented x 3. Normal mood.    Labs on Admission: I have personally reviewed following labs and imaging studies  CBC:  Recent Labs Lab 10/20/16 2132  WBC 10.3  NEUTROABS 7.1  HGB 11.9*  HCT 38.0  MCV 87.2  PLT 298   Basic Metabolic Panel:  Recent Labs Lab 10/20/16 2132  NA 137  K 4.3  CL 102  CO2 28  GLUCOSE 128*  BUN 18  CREATININE 1.02*  CALCIUM 9.3   GFR: Estimated Creatinine Clearance: 37.9 mL/min (A) (by C-G formula based on SCr of 1.02 mg/dL (H)). Liver Function Tests:  Recent Labs Lab 10/20/16 2132  AST 21  ALT 22  ALKPHOS 84  BILITOT 1.3*  PROT 6.5  ALBUMIN 2.9*   No results for input(s): LIPASE, AMYLASE in the last 168 hours. No results for input(s): AMMONIA in the last 168 hours. Coagulation Profile:  Recent Labs Lab 10/20/16 2132  INR 2.33   Cardiac Enzymes: No results for input(s): CKTOTAL, CKMB, CKMBINDEX, TROPONINI in the last 168 hours. BNP (last 3 results) No results for input(s): PROBNP in the last 8760 hours. HbA1C: No results for input(s): HGBA1C in the last 72 hours. CBG: No results for input(s): GLUCAP in the last 168 hours. Lipid Profile: No results for input(s): CHOL, HDL, LDLCALC, TRIG, CHOLHDL, LDLDIRECT in the last 72 hours. Thyroid Function Tests: No results for input(s): TSH, T4TOTAL, FREET4, T3FREE, THYROIDAB in the last 72 hours. Anemia Panel: No results for input(s): VITAMINB12, FOLATE, FERRITIN, TIBC, IRON, RETICCTPCT in the last 72 hours. Urine analysis:    Component Value Date/Time    COLORURINE YELLOW 03/07/2010 1517   APPEARANCEUR CLOUDY (A) 03/07/2010 1517   LABSPEC 1.014 03/07/2010 1517   PHURINE 6.0 03/07/2010 1517   GLUCOSEU NEGATIVE 03/07/2010 1517   GLUCOSEU NEG mg/dL 16/04/930 3557   HGBUR SMALL (A) 03/07/2010 1517   BILIRUBINUR neg 03/29/2016 1447   KETONESUR NEGATIVE 03/07/2010 1517   PROTEINUR neg 03/29/2016 1447   PROTEINUR 30 (A) 03/07/2010 1517   UROBILINOGEN 0.2 03/29/2016 1447   UROBILINOGEN 0.2 03/07/2010 1517   NITRITE neg 03/29/2016 1447   NITRITE NEGATIVE 03/07/2010 1517   LEUKOCYTESUR moderate (2+) (A) 03/29/2016 1447    Radiological Exams on Admission: Dg Chest 2 View  Result Date: 10/20/2016 CLINICAL DATA:  Shortness of breath. Bilateral leg pain and swelling. EXAM: CHEST  2 VIEW COMPARISON:  05/17/2016 FINDINGS: Post median sternotomy. Stable cardiomegaly and mediastinal contours with tortuosity and atherosclerosis of thoracic aorta. Chronic elevation of left hemidiaphragm. No pulmonary edema. Chronic bronchial thickening. No confluent airspace disease, pleural effusion or pneumothorax. The bones are under mineralized. IMPRESSION: 1. Stable cardiomegaly.  Unchanged tortuous atherosclerotic aorta. 2. Chronic bronchial thickening. Chronic elevation of left hemidiaphragm. 3. No acute abnormality. Electronically Signed   By: Rubye Oaks M.D.   On: 10/20/2016 21:20    EKG: Independently reviewed.  Assessment/Plan Principal Problem:   Atrial fibrillation with RVR (HCC) Active Problems:   Rheumatoid arthritis (HCC)   Diabetes mellitus with renal complications (HCC)    1. A.Fib RFR - 1. Cardizem gtt 2. Resume home PO cardizem and try and taper gtt off 2. CHF - 1. Lasix 40mg  IV x1 in ED 2. Resume home torsemide 3. Repeat BMP in AM 3. RA - continue home meds 4. DM - 1. Diet  controlled at this point 2. A1Cs have always been good. 7.1 a couple of months back is the highest on record 3. Despite this appears to have diabetic foot  ulcers? 1. Will get wound care to take a look too. 5. Peripheral neuropathy - 1. Has been previously diagnosed as diabetic peripheral neuropathy 2. Although im somewhat surprised she has it to this extent given her well controlled A1Cs throughout history. 3. Will check B12 level 4. Consider other causes of neuropathy and work up? 5. Pain controlled and patient wants to hold off on starting new med for now. 6. But if pain returns then would try either neurontin or lyrica in this patient. 7. Dont want to give more tylenol right now given very heavy use over past couple of days 1. Tylenol level was only 15 though, so no obvious OD evidence needing Acetylcystine.  DVT prophylaxis: Continue coumadin Code Status: DNR - verified with patient Family Communication: No family in room Disposition Plan: Home after admit Consults called: None Admission status: Place in obs   [name], Heywood Iles. DO [facility] Pager [phone]  If 7AM-7PM, please contact day team taking care of patient [email or web address] Password [facility]  10/20/2016, 11:55 PM

## 2016-10-20 NOTE — ED Notes (Signed)
Pt states for the last 24 hours she has taken 1000mg  Tylenol every 2 hours with no relief

## 2016-10-20 NOTE — ED Triage Notes (Signed)
Per Pt, Pt is coming from home with bilateral leg pain that started yesterday. Pt reports hx of neuropathy. Complains of some bilateral swelling that has decreased.

## 2016-10-21 ENCOUNTER — Telehealth: Payer: Self-pay | Admitting: Family Medicine

## 2016-10-21 ENCOUNTER — Encounter (HOSPITAL_COMMUNITY): Payer: Self-pay

## 2016-10-21 DIAGNOSIS — M79605 Pain in left leg: Secondary | ICD-10-CM

## 2016-10-21 DIAGNOSIS — E1129 Type 2 diabetes mellitus with other diabetic kidney complication: Secondary | ICD-10-CM | POA: Diagnosis not present

## 2016-10-21 DIAGNOSIS — I5023 Acute on chronic systolic (congestive) heart failure: Secondary | ICD-10-CM | POA: Diagnosis not present

## 2016-10-21 DIAGNOSIS — N183 Chronic kidney disease, stage 3 (moderate): Secondary | ICD-10-CM | POA: Diagnosis not present

## 2016-10-21 DIAGNOSIS — E1122 Type 2 diabetes mellitus with diabetic chronic kidney disease: Secondary | ICD-10-CM

## 2016-10-21 DIAGNOSIS — N179 Acute kidney failure, unspecified: Secondary | ICD-10-CM | POA: Diagnosis not present

## 2016-10-21 DIAGNOSIS — I4891 Unspecified atrial fibrillation: Secondary | ICD-10-CM | POA: Diagnosis not present

## 2016-10-21 DIAGNOSIS — M069 Rheumatoid arthritis, unspecified: Secondary | ICD-10-CM

## 2016-10-21 DIAGNOSIS — M79604 Pain in right leg: Secondary | ICD-10-CM | POA: Diagnosis not present

## 2016-10-21 DIAGNOSIS — I5033 Acute on chronic diastolic (congestive) heart failure: Secondary | ICD-10-CM | POA: Diagnosis not present

## 2016-10-21 LAB — BASIC METABOLIC PANEL
ANION GAP: 10 (ref 5–15)
BUN: 18 mg/dL (ref 6–20)
CO2: 27 mmol/L (ref 22–32)
Calcium: 9.2 mg/dL (ref 8.9–10.3)
Chloride: 101 mmol/L (ref 101–111)
Creatinine, Ser: 1.04 mg/dL — ABNORMAL HIGH (ref 0.44–1.00)
GFR, EST AFRICAN AMERICAN: 56 mL/min — AB (ref >60)
GFR, EST NON AFRICAN AMERICAN: 48 mL/min — AB (ref >60)
Glucose, Bld: 149 mg/dL — ABNORMAL HIGH (ref 65–99)
POTASSIUM: 4.5 mmol/L (ref 3.5–5.1)
Sodium: 138 mmol/L (ref 135–145)

## 2016-10-21 LAB — PROTIME-INR
INR: 2.03
Prothrombin Time: 23.2 seconds — ABNORMAL HIGH (ref 11.4–15.2)

## 2016-10-21 LAB — VITAMIN B12: Vitamin B-12: 847 pg/mL (ref 180–914)

## 2016-10-21 LAB — MRSA PCR SCREENING: MRSA BY PCR: NEGATIVE

## 2016-10-21 MED ORDER — WARFARIN SODIUM 3 MG PO TABS
3.0000 mg | ORAL_TABLET | Freq: Once | ORAL | Status: DC
  Filled 2016-10-21: qty 1

## 2016-10-21 MED ORDER — WARFARIN - PHARMACIST DOSING INPATIENT
Freq: Every day | Status: DC
  Administered 2016-10-21 – 2016-10-23 (×3)

## 2016-10-21 MED ORDER — WARFARIN SODIUM 3 MG PO TABS
3.0000 mg | ORAL_TABLET | Freq: Every day | ORAL | Status: DC
  Administered 2016-10-21: 3 mg via ORAL
  Filled 2016-10-21: qty 1

## 2016-10-21 MED ORDER — GABAPENTIN 100 MG PO CAPS: 100.0000 mg | ORAL_CAPSULE | Freq: Every day | ORAL | Status: DC

## 2016-10-21 MED ORDER — WARFARIN SODIUM 3 MG PO TABS: 3.0000 mg | ORAL_TABLET | Freq: Once | ORAL | Status: DC

## 2016-10-21 NOTE — ED Notes (Signed)
No bed in 2C17 per staff, [facility] notified.

## 2016-10-21 NOTE — Plan of Care (Signed)
Note: CHADS VASC score is 7

## 2016-10-21 NOTE — Progress Notes (Signed)
ANTICOAGULATION CONSULT NOTE  Pharmacy Consult for Coumadin Indication: atrial fibrillation  Allergies  Allergen Reactions  . Cefuroxime Anaphylaxis    Throat swelling  . Cephalexin Rash   Patient Measurements: Height: 5\' 2"  (157.5 cm) Weight: 155 lb 13.8 oz (70.7 kg) IBW/kg (Calculated) : 50.1  Assessment: 83 yo F presents with bilateral leg pain and swelling.  INR on admit is therapeutic   Goal of Therapy:  INR 2-3 Monitor platelets by anticoagulation protocol: Yes   Plan:  Warfarin 3 mg po daily Monitor daily INR, CBC, s/s of bleed  Thank you 97, PharmD [phone] 10/21/2016 9:21 AM

## 2016-10-21 NOTE — Consult Note (Addendum)
WOC Nurse wound consult note Reason for Consult:Consult requested for bilat feet.  Pt states she is followed by a podiatrist prior to admission who trims the callouses. Wound type: No open wounds or drainage.  Dry slightly raised callous areas to bilat plantar feet. Right anterior great toe with reddish-purple bruise. No topical treatment indicated at this time.  Pt states she will follow-up with the podiatrist after discharge. Please re-consult if further assistance is needed.  Thank-you,  Cammie Mcgee MSN, RN, CWOCN, Clarissa, CNS [phone]

## 2016-10-21 NOTE — Progress Notes (Signed)
[facility]  PROGRESS NOTE  [name] XNA:[id number] DOB: 1929-11-22 DOA: 10/20/2016 PCP: Rodolph Bong, MD   Brief HPI:    83 year old female with a history of atrial fibrillation on Cardizem, Coumadin, CHF, diabetes mellitus, hypertension came to hospital with bilateral leg pain. Patient developed A. fib with RVR in ED. Started on Cardizem GTT.   Subjective   This morning patient denies leg pain. No chest pain or shortness of breath. Still on Cardizem gtt. 10 mg per hour.   Assessment/Plan:     1. Atrial fibrillation with RVR-continue Cardizem gtt., patient started on by mouth Cardizem, metoprolol. We'll try to wean off IV Cardizem. Continue coumadin per pharmacy consultation. 2. Chronic diastolic CHF-currently well compensated. Continue torsemide. 3. Rheumatoid arthritis-stable, continue Arava. 4. Hypothyroidism-stable, continue Synthroid 5. Peripheral neuropathy- pain is well controlled, B12 level 847. 6. Diabetes mellitus- diet controlled. Last hemoglobin A1c 7.1 few months back .We'll start sliding scale insulin with NovoLog.    DVT prophylaxis: Coumadin  Code Status: DO NOT RESUSCITATE  Family Communication: No family at bedside   Disposition Plan: Likely home in next 2-3 days.   Consultants:  None  Procedures:  None  Continuous infusions . diltiazem (CARDIZEM) infusion 5 mg/hr (10/21/16 0759)      Antibiotics:   Anti-infectives    None       Objective   Vitals:   10/21/16 0303 10/21/16 0700 10/21/16 0800 10/21/16 0816  BP: 119/85 (!) 130/57 (!) 99/51 102/78  Pulse: (!) 131 84 (!) 106   Resp: 20 19 (!) 21   Temp: 98.3 F (36.8 C)   99 F (37.2 C)  TempSrc: Oral   Oral  SpO2: 96% 95% 97%   Weight:      Height:        Intake/Output Summary (Last 24 hours) at 10/21/16 1206 Last data filed at 10/21/16 0800  Gross per 24 hour  Intake              130 ml  Output              625 ml  Net             -495 ml   Filed Weights    10/20/16 1722 10/21/16 0145 10/21/16 0151  Weight: 71.2 kg (157 lb) 70.7 kg (155 lb 13.8 oz) 70.7 kg (155 lb 13.8 oz)     Physical Examination:   Physical Exam: Eyes: No icterus, extraocular muscles intact  Mouth: Oral mucosa is moist, no lesions on palate,  Neck: Supple, no deformities, masses, or tenderness Lungs: Normal respiratory effort, bilateral clear to auscultation, no crackles or wheezes.  Heart: Irregular rhythm, S1 and S2 normal, no murmurs, rubs auscultated Abdomen: BS normoactive,soft,nondistended,non-tender to palpation,no organomegaly Extremities: No pretibial edema, no erythema, no cyanosis, no clubbing Neuro : Alert and oriented to time, place and person, No focal deficits  Skin: No rashes seen on exam     Data Reviewed: I have personally reviewed following labs and imaging studies  CBG: No results for input(s): GLUCAP in the last 168 hours.  CBC:  Recent Labs Lab 10/20/16 2132  WBC 10.3  NEUTROABS 7.1  HGB 11.9*  HCT 38.0  MCV 87.2  PLT 298    Basic Metabolic Panel:  Recent Labs Lab 10/20/16 2132 10/21/16 0152  NA 137 138  K 4.3 4.5  CL 102 101  CO2 28 27  GLUCOSE 128* 149*  BUN 18 18  CREATININE 1.02* 1.04*  CALCIUM  9.3 9.2    Recent Results (from the past 240 hour(s))  MRSA PCR Screening     Status: None   Collection Time: 10/21/16  2:15 AM  Result Value Ref Range Status   MRSA by PCR NEGATIVE NEGATIVE Final    Comment:        The GeneXpert MRSA Assay (FDA approved for NASAL specimens only), is one component of a comprehensive MRSA colonization surveillance program. It is not intended to diagnose MRSA infection nor to guide or monitor treatment for MRSA infections.      Liver Function Tests:  Recent Labs Lab 10/20/16 2132  AST 21  ALT 22  ALKPHOS 84  BILITOT 1.3*  PROT 6.5  ALBUMIN 2.9*    Cardiac Enzymes: No results for input(s): CKTOTAL, CKMB, CKMBINDEX, TROPONINI in the last 168 hours. BNP (last 3  results)  Recent Labs  10/20/16 2132  BNP 345.8*    ProBNP (last 3 results) No results for input(s): PROBNP in the last 8760 hours.    Studies: Dg Chest 2 View  Result Date: 10/20/2016 CLINICAL DATA:  Shortness of breath. Bilateral leg pain and swelling. EXAM: CHEST  2 VIEW COMPARISON:  05/17/2016 FINDINGS: Post median sternotomy. Stable cardiomegaly and mediastinal contours with tortuosity and atherosclerosis of thoracic aorta. Chronic elevation of left hemidiaphragm. No pulmonary edema. Chronic bronchial thickening. No confluent airspace disease, pleural effusion or pneumothorax. The bones are under mineralized. IMPRESSION: 1. Stable cardiomegaly.  Unchanged tortuous atherosclerotic aorta. 2. Chronic bronchial thickening. Chronic elevation of left hemidiaphragm. 3. No acute abnormality. Electronically Signed   By: Rubye Oaks M.D.   On: 10/20/2016 21:20    Scheduled Meds: . aspirin EC  81 mg Oral Daily  . clonazePAM  0.5 mg Oral QHS  . diltiazem  240 mg Oral QHS  . docusate sodium  100 mg Oral BID  . iron polysaccharides  150 mg Oral Daily  . leflunomide  10 mg Oral QHS  . levothyroxine  50 mcg Oral QAC breakfast  . lisinopril  20 mg Oral Daily  . metoprolol succinate  25 mg Oral Daily  . multivitamin with minerals  1 tablet Oral Daily  . pantoprazole  40 mg Oral Daily  . pravastatin  40 mg Oral Daily  . torsemide  50 mg Oral Daily  . warfarin  3 mg Oral q1800  . Warfarin - Pharmacist Dosing Inpatient   Does not apply q1800      Time spent: 20 min  [facility]   [facility] Pager [phone]. If 7PM-7AM, please contact night-coverage at [email or web address], Office  [phone]  password [email or web address]  10/21/2016, 12:06 PM  LOS: 0 days

## 2016-10-22 DIAGNOSIS — Z7983 Long term (current) use of bisphosphonates: Secondary | ICD-10-CM | POA: Diagnosis not present

## 2016-10-22 DIAGNOSIS — I739 Peripheral vascular disease, unspecified: Secondary | ICD-10-CM | POA: Diagnosis present

## 2016-10-22 DIAGNOSIS — N179 Acute kidney failure, unspecified: Secondary | ICD-10-CM | POA: Diagnosis present

## 2016-10-22 DIAGNOSIS — M79604 Pain in right leg: Secondary | ICD-10-CM | POA: Diagnosis present

## 2016-10-22 DIAGNOSIS — Z96653 Presence of artificial knee joint, bilateral: Secondary | ICD-10-CM | POA: Diagnosis present

## 2016-10-22 DIAGNOSIS — E1122 Type 2 diabetes mellitus with diabetic chronic kidney disease: Secondary | ICD-10-CM | POA: Diagnosis not present

## 2016-10-22 DIAGNOSIS — I482 Chronic atrial fibrillation: Secondary | ICD-10-CM | POA: Diagnosis present

## 2016-10-22 DIAGNOSIS — I2510 Atherosclerotic heart disease of native coronary artery without angina pectoris: Secondary | ICD-10-CM | POA: Diagnosis present

## 2016-10-22 DIAGNOSIS — Z951 Presence of aortocoronary bypass graft: Secondary | ICD-10-CM | POA: Diagnosis not present

## 2016-10-22 DIAGNOSIS — I110 Hypertensive heart disease with heart failure: Secondary | ICD-10-CM | POA: Diagnosis present

## 2016-10-22 DIAGNOSIS — M1990 Unspecified osteoarthritis, unspecified site: Secondary | ICD-10-CM | POA: Diagnosis present

## 2016-10-22 DIAGNOSIS — E1151 Type 2 diabetes mellitus with diabetic peripheral angiopathy without gangrene: Secondary | ICD-10-CM | POA: Diagnosis present

## 2016-10-22 DIAGNOSIS — E1129 Type 2 diabetes mellitus with other diabetic kidney complication: Secondary | ICD-10-CM | POA: Diagnosis present

## 2016-10-22 DIAGNOSIS — I6529 Occlusion and stenosis of unspecified carotid artery: Secondary | ICD-10-CM | POA: Diagnosis present

## 2016-10-22 DIAGNOSIS — Z79899 Other long term (current) drug therapy: Secondary | ICD-10-CM | POA: Diagnosis not present

## 2016-10-22 DIAGNOSIS — T461X6A Underdosing of calcium-channel blockers, initial encounter: Secondary | ICD-10-CM | POA: Diagnosis present

## 2016-10-22 DIAGNOSIS — K219 Gastro-esophageal reflux disease without esophagitis: Secondary | ICD-10-CM | POA: Diagnosis present

## 2016-10-22 DIAGNOSIS — E039 Hypothyroidism, unspecified: Secondary | ICD-10-CM | POA: Diagnosis present

## 2016-10-22 DIAGNOSIS — Z7901 Long term (current) use of anticoagulants: Secondary | ICD-10-CM | POA: Diagnosis not present

## 2016-10-22 DIAGNOSIS — T501X6A Underdosing of loop [high-ceiling] diuretics, initial encounter: Secondary | ICD-10-CM | POA: Diagnosis present

## 2016-10-22 DIAGNOSIS — Z86718 Personal history of other venous thrombosis and embolism: Secondary | ICD-10-CM | POA: Diagnosis not present

## 2016-10-22 DIAGNOSIS — Z66 Do not resuscitate: Secondary | ICD-10-CM | POA: Diagnosis present

## 2016-10-22 DIAGNOSIS — M069 Rheumatoid arthritis, unspecified: Secondary | ICD-10-CM | POA: Diagnosis present

## 2016-10-22 DIAGNOSIS — I5023 Acute on chronic systolic (congestive) heart failure: Secondary | ICD-10-CM | POA: Diagnosis not present

## 2016-10-22 DIAGNOSIS — E1142 Type 2 diabetes mellitus with diabetic polyneuropathy: Secondary | ICD-10-CM | POA: Diagnosis present

## 2016-10-22 DIAGNOSIS — Z881 Allergy status to other antibiotic agents status: Secondary | ICD-10-CM | POA: Diagnosis not present

## 2016-10-22 DIAGNOSIS — I4891 Unspecified atrial fibrillation: Secondary | ICD-10-CM | POA: Diagnosis not present

## 2016-10-22 DIAGNOSIS — I5043 Acute on chronic combined systolic (congestive) and diastolic (congestive) heart failure: Secondary | ICD-10-CM | POA: Diagnosis present

## 2016-10-22 LAB — CBC
HEMATOCRIT: 34.8 % — AB (ref 36.0–46.0)
HEMOGLOBIN: 10.9 g/dL — AB (ref 12.0–15.0)
MCH: 27.3 pg (ref 26.0–34.0)
MCHC: 31.3 g/dL (ref 30.0–36.0)
MCV: 87 fL (ref 78.0–100.0)
Platelets: 248 10*3/uL (ref 150–400)
RBC: 4 MIL/uL (ref 3.87–5.11)
RDW: 19.5 % — ABNORMAL HIGH (ref 11.5–15.5)
WBC: 8 10*3/uL (ref 4.0–10.5)

## 2016-10-22 LAB — URINALYSIS, ROUTINE W REFLEX MICROSCOPIC
BILIRUBIN URINE: NEGATIVE
GLUCOSE, UA: NEGATIVE mg/dL
KETONES UR: NEGATIVE mg/dL
NITRITE: NEGATIVE
PH: 6 (ref 5.0–8.0)
PROTEIN: NEGATIVE mg/dL
Specific Gravity, Urine: 1.003 — ABNORMAL LOW (ref 1.005–1.030)

## 2016-10-22 LAB — PROTIME-INR
INR: 1.74
PROTHROMBIN TIME: 20.6 s — AB (ref 11.4–15.2)

## 2016-10-22 MED ORDER — WARFARIN SODIUM 5 MG PO TABS
5.0000 mg | ORAL_TABLET | Freq: Once | ORAL | Status: AC
  Administered 2016-10-22: 5 mg via ORAL
  Filled 2016-10-22 (×2): qty 1

## 2016-10-22 MED ORDER — FUROSEMIDE 10 MG/ML IJ SOLN
40.0000 mg | Freq: Two times a day (BID) | INTRAMUSCULAR | Status: AC
  Administered 2016-10-22 (×2): 40 mg via INTRAVENOUS
  Filled 2016-10-22 (×2): qty 4

## 2016-10-22 NOTE — Progress Notes (Signed)
ANTICOAGULATION CONSULT NOTE  Pharmacy Consult for Coumadin Indication: atrial fibrillation  Allergies  Allergen Reactions  . Cefuroxime Anaphylaxis and Other (See Comments)    Throat swelling  . Cephalexin Rash   Patient Measurements: Height: 5\' 2"  (157.5 cm) Weight: 154 lb 5.2 oz (70 kg) IBW/kg (Calculated) : 50.1  Assessment: 86 yoF on Coumadin PTA for AFib presents with bilateral leg pain and swelling. INR therapeutic on admit now subtherapeutic at 1.74 likely due to missed dose on 7/12 - will give slightly boosted dose tonight to prevent further INR drop.  PTA Warfarin Dose = 3mg  daily  Goal of Therapy:  INR 2-3 Monitor platelets by anticoagulation protocol: Yes   Plan:  -Warfarin 5mg  PO x1 tonight -Monitor INR, S/Sx bleeding daily  9/12, PharmD PGY-2 Cardiology Pharmacy Resident Pager: [phone] 10/22/2016

## 2016-10-22 NOTE — Progress Notes (Signed)
[facility]  PROGRESS NOTE  [name] SJG:[id number] DOB: 1929-10-06 DOA: 10/20/2016 PCP: Rodolph Bong, MD   Brief HPI:    83 year old female with a history of atrial fibrillation on Cardizem, Coumadin, CHF, diabetes mellitus, hypertension came to hospital with bilateral leg pain. Patient developed A. fib with RVR in ED. Started on Cardizem GTT.   Subjective   Last night patient had shortness of breath. Requiring oxygen via nasal cannula. Denies chest pain at this time   Assessment/Plan:     1. Atrial fibrillation with RVR-Patient was started on  Cardizem gtt., which has now been discontinued. Currently patient on  by mouth Cardizem, metoprolol. Continue coumadin per pharmacy consultation. 2. Acute on chronic diastolic CHF- will discontinue torsemide and start Lasix 40 mg IV every 12 hours. Strict intake and output. Follow BMP in a.m. 3. Rheumatoid arthritis-stable, continue Arava. 4. Hypothyroidism-stable, continue Synthroid 5. Peripheral neuropathy- pain is well controlled, B12 level 847. 6. Diabetes mellitus- diet controlled. Last hemoglobin A1c 7.1 few months back .We'll start sliding scale insulin with NovoLog.    DVT prophylaxis: Coumadin  Code Status: DO NOT RESUSCITATE  Family Communication: No family at bedside   Disposition Plan: Likely home in next 2-3 days.   Consultants:  None  Procedures:  None  Continuous infusions . diltiazem (CARDIZEM) infusion Stopped (10/21/16 1610)      Antibiotics:   Anti-infectives    None       Objective   Vitals:   10/22/16 0800 10/22/16 0857 10/22/16 1200 10/22/16 1232  BP:    (!) 126/53  Pulse: 69  70   Resp: 13  (!) 22   Temp:  98.5 F (36.9 C)  97.6 F (36.4 C)  TempSrc:  Oral  Oral  SpO2: 99%  98%   Weight:      Height:        Intake/Output Summary (Last 24 hours) at 10/22/16 1442 Last data filed at 10/22/16 1400  Gross per 24 hour  Intake           676.17 ml  Output                50 ml  Net           626.17 ml   Filed Weights   10/21/16 0145 10/21/16 0151 10/22/16 0420  Weight: 70.7 kg (155 lb 13.8 oz) 70.7 kg (155 lb 13.8 oz) 70 kg (154 lb 5.2 oz)     Physical Examination:  Physical Exam: Eyes: No icterus, extraocular muscles intact  Mouth: Oral mucosa is moist, no lesions on palate,  Neck: Supple, no deformities, masses, or tenderness Lungs: Normal respiratory effort, bibasilar crackles Heart: Regular rate and rhythm, S1 and S2 normal, no murmurs, rubs auscultated Abdomen: BS normoactive,soft,nondistended,non-tender to palpation,no organomegaly Extremities: No pretibial edema, no erythema, no cyanosis, no clubbing Neuro : Alert and oriented to time, place and person, No focal deficits Skin: No rashes seen on exam    Data Reviewed: I have personally reviewed following labs and imaging studies  CBG: No results for input(s): GLUCAP in the last 168 hours.  CBC:  Recent Labs Lab 10/20/16 2132 10/22/16 0444  WBC 10.3 8.0  NEUTROABS 7.1  --   HGB 11.9* 10.9*  HCT 38.0 34.8*  MCV 87.2 87.0  PLT 298 248    Basic Metabolic Panel:  Recent Labs Lab 10/20/16 2132 10/21/16 0152  NA 137 138  K 4.3 4.5  CL 102 101  CO2 28 27  GLUCOSE 128* 149*  BUN 18 18  CREATININE 1.02* 1.04*  CALCIUM 9.3 9.2    Recent Results (from the past 240 hour(s))  MRSA PCR Screening     Status: None   Collection Time: 10/21/16  2:15 AM  Result Value Ref Range Status   MRSA by PCR NEGATIVE NEGATIVE Final    Comment:        The GeneXpert MRSA Assay (FDA approved for NASAL specimens only), is one component of a comprehensive MRSA colonization surveillance program. It is not intended to diagnose MRSA infection nor to guide or monitor treatment for MRSA infections.      Liver Function Tests:  Recent Labs Lab 10/20/16 2132  AST 21  ALT 22  ALKPHOS 84  BILITOT 1.3*  PROT 6.5  ALBUMIN 2.9*    Cardiac Enzymes: No results for input(s): CKTOTAL,  CKMB, CKMBINDEX, TROPONINI in the last 168 hours. BNP (last 3 results)  Recent Labs  10/20/16 2132  BNP 345.8*    ProBNP (last 3 results) No results for input(s): PROBNP in the last 8760 hours.    Studies: Dg Chest 2 View  Result Date: 10/20/2016 CLINICAL DATA:  Shortness of breath. Bilateral leg pain and swelling. EXAM: CHEST  2 VIEW COMPARISON:  05/17/2016 FINDINGS: Post median sternotomy. Stable cardiomegaly and mediastinal contours with tortuosity and atherosclerosis of thoracic aorta. Chronic elevation of left hemidiaphragm. No pulmonary edema. Chronic bronchial thickening. No confluent airspace disease, pleural effusion or pneumothorax. The bones are under mineralized. IMPRESSION: 1. Stable cardiomegaly.  Unchanged tortuous atherosclerotic aorta. 2. Chronic bronchial thickening. Chronic elevation of left hemidiaphragm. 3. No acute abnormality. Electronically Signed   By: Rubye Oaks M.D.   On: 10/20/2016 21:20    Scheduled Meds: . aspirin EC  81 mg Oral Daily  . clonazePAM  0.5 mg Oral QHS  . diltiazem  240 mg Oral QHS  . docusate sodium  100 mg Oral BID  . furosemide  40 mg Intravenous Q12H  . iron polysaccharides  150 mg Oral Daily  . leflunomide  10 mg Oral QHS  . levothyroxine  50 mcg Oral QAC breakfast  . lisinopril  20 mg Oral Daily  . metoprolol succinate  25 mg Oral Daily  . multivitamin with minerals  1 tablet Oral Daily  . pantoprazole  40 mg Oral Daily  . pravastatin  40 mg Oral Daily  . warfarin  5 mg Oral ONCE-1800  . Warfarin - Pharmacist Dosing Inpatient   Does not apply q1800      Time spent: 20 min  [facility]   [facility] Pager [phone]. If 7PM-7AM, please contact night-coverage at [email or web address], Office  [phone]  password [email or web address]  10/22/2016, 2:42 PM  LOS: 0 days

## 2016-10-23 DIAGNOSIS — I5033 Acute on chronic diastolic (congestive) heart failure: Secondary | ICD-10-CM

## 2016-10-23 DIAGNOSIS — N179 Acute kidney failure, unspecified: Secondary | ICD-10-CM

## 2016-10-23 DIAGNOSIS — E1129 Type 2 diabetes mellitus with other diabetic kidney complication: Secondary | ICD-10-CM

## 2016-10-23 LAB — PROTIME-INR
INR: 1.86
PROTHROMBIN TIME: 21.7 s — AB (ref 11.4–15.2)

## 2016-10-23 LAB — BASIC METABOLIC PANEL
Anion gap: 11 (ref 5–15)
BUN: 49 mg/dL — AB (ref 6–20)
CO2: 24 mmol/L (ref 22–32)
Calcium: 8.1 mg/dL — ABNORMAL LOW (ref 8.9–10.3)
Chloride: 101 mmol/L (ref 101–111)
Creatinine, Ser: 1.83 mg/dL — ABNORMAL HIGH (ref 0.44–1.00)
GFR calc Af Amer: 28 mL/min — ABNORMAL LOW (ref >60)
GFR, EST NON AFRICAN AMERICAN: 24 mL/min — AB (ref >60)
GLUCOSE: 156 mg/dL — AB (ref 65–99)
POTASSIUM: 4 mmol/L (ref 3.5–5.1)
Sodium: 136 mmol/L (ref 135–145)

## 2016-10-23 LAB — GLUCOSE, CAPILLARY: Glucose-Capillary: 133 mg/dL — ABNORMAL HIGH (ref 65–99)

## 2016-10-23 LAB — URIC ACID: URIC ACID, SERUM: 9.1 mg/dL — AB (ref 2.3–6.6)

## 2016-10-23 MED ORDER — FUROSEMIDE 10 MG/ML IJ SOLN
20.0000 mg | Freq: Two times a day (BID) | INTRAMUSCULAR | Status: DC
  Administered 2016-10-23 – 2016-10-24 (×3): 20 mg via INTRAVENOUS
  Filled 2016-10-23 (×3): qty 2

## 2016-10-23 MED ORDER — MAGNESIUM OXIDE 400 (241.3 MG) MG PO TABS
400.0000 mg | ORAL_TABLET | Freq: Two times a day (BID) | ORAL | Status: DC
  Administered 2016-10-23 – 2016-10-24 (×3): 400 mg via ORAL
  Filled 2016-10-23 (×3): qty 1

## 2016-10-23 MED ORDER — INSULIN ASPART 100 UNIT/ML ~~LOC~~ SOLN
0.0000 [IU] | Freq: Three times a day (TID) | SUBCUTANEOUS | Status: DC
  Administered 2016-10-24: 2 [IU] via SUBCUTANEOUS
  Filled 2016-10-23 (×25): qty 0.09

## 2016-10-23 MED ORDER — WARFARIN SODIUM 3 MG PO TABS
3.0000 mg | ORAL_TABLET | Freq: Once | ORAL | Status: AC
  Administered 2016-10-23: 3 mg via ORAL
  Filled 2016-10-23: qty 1

## 2016-10-23 MED ORDER — URELLE 81 MG PO TABS
1.0000 | ORAL_TABLET | Freq: Four times a day (QID) | ORAL | Status: DC | PRN
  Administered 2016-10-23: 81 mg via ORAL
  Filled 2016-10-23 (×2): qty 1

## 2016-10-23 NOTE — Progress Notes (Addendum)
[facility]  PROGRESS NOTE  [name] [id number]:[id number] DOB: Jul 02, 1929 DOA: 10/20/2016 PCP: Rodolph Bong, MD   Brief HPI:    83 year old female with a history of atrial fibrillation on Cardizem, Coumadin, CHF, diabetes mellitus, hypertension came to hospital with bilateral leg pain. Patient developed A. fib with RVR in ED. Started on Cardizem GTT.   Subjective   Patient seen and examined, O2 sats dropped to the 80s when sleeping. Improved to 90s when awake.   Assessment/Plan:     1. Atrial fibrillation with RVR-Patient was started on  Cardizem gtt., which has now been discontinued. Currently patient on  by mouth Cardizem, metoprolol. Continue coumadin per pharmacy consultation. 2. Acute on chronic diastolic CHF- Torsemide was discontinued and patient started on  Lasix 40 mg IV every 12 hours. Patient still has bibasilar crackles, O2 sats dropped into 80s while sleeping. Will change Lasix to 20 minute grams IV every 12 hours.  3. Acute kidney injury-creatinine elevated 1.83, from diuresis. Will continue on the dose of Lasix 20 mg IV every 12 hours. Hold ACE inhibitor. 4. Rheumatoid arthritis-stable, continue Arava. 5. Hypothyroidism-stable, continue Synthroid 6. Peripheral neuropathy- pain is well controlled, B12 level 847. 7. Diabetes mellitus- diet controlled. Last hemoglobin A1c 7.1 few months back .Continue  sliding scale insulin with NovoLog.    DVT prophylaxis: Coumadin  Code Status: DO NOT RESUSCITATE  Family Communication: No family at bedside   Disposition Plan: Likely home in next 2-3 days.   Consultants:  None  Procedures:  None  Continuous infusions . diltiazem (CARDIZEM) infusion Stopped (10/21/16 1610)      Antibiotics:   Anti-infectives    None       Objective   Vitals:   10/23/16 0300 10/23/16 0543 10/23/16 0837 10/23/16 1135  BP:  (!) 141/79 135/76 (!) 116/51  Pulse: 74 (!) 59 91 69  Resp:  18    Temp:  98.2 F (36.8 C)  98.5 F (36.9 C) (!) 97.5 F (36.4 C)  TempSrc:  Oral Oral Oral  SpO2: 94% 95% 96% 90%  Weight:      Height:        Intake/Output Summary (Last 24 hours) at 10/23/16 1236 Last data filed at 10/23/16 1200  Gross per 24 hour  Intake             1440 ml  Output             1800 ml  Net             -360 ml   Filed Weights   10/21/16 0145 10/21/16 0151 10/22/16 0420  Weight: 70.7 kg (155 lb 13.8 oz) 70.7 kg (155 lb 13.8 oz) 70 kg (154 lb 5.2 oz)     Physical Examination:  Physical Exam: Eyes: No icterus, extraocular muscles intact  Mouth: Oral mucosa is moist, no lesions on palate,  Neck: Supple, no deformities, masses, or tenderness Lungs: Normal respiratory effort,Bibasilar crackles  Heart: Regular rate and rhythm, S1 and S2 normal, no murmurs, rubs auscultated Abdomen: BS normoactive,soft,nondistended,non-tender to palpation,no organomegaly Extremities: No pretibial edema, no erythema, no cyanosis, no clubbing Neuro : Alert and oriented to time, place and person, No focal deficits Skin: No rashes seen on exam    Data Reviewed: I have personally reviewed following labs and imaging studies  CBG: No results for input(s): GLUCAP in the last 168 hours.  CBC:  Recent Labs Lab 10/20/16 2132 10/22/16 0444  WBC 10.3 8.0  NEUTROABS 7.1  --  HGB 11.9* 10.9*  HCT 38.0 34.8*  MCV 87.2 87.0  PLT 298 248    Basic Metabolic Panel:  Recent Labs Lab 10/20/16 2132 10/21/16 0152 10/23/16 0311  NA 137 138 136  K 4.3 4.5 4.0  CL 102 101 101  CO2 28 27 24   GLUCOSE 128* 149* 156*  BUN 18 18 49*  CREATININE 1.02* 1.04* 1.83*  CALCIUM 9.3 9.2 8.1*    Recent Results (from the past 240 hour(s))  MRSA PCR Screening     Status: None   Collection Time: 10/21/16  2:15 AM  Result Value Ref Range Status   MRSA by PCR NEGATIVE NEGATIVE Final    Comment:        The GeneXpert MRSA Assay (FDA approved for NASAL specimens only), is one component of a comprehensive MRSA  colonization surveillance program. It is not intended to diagnose MRSA infection nor to guide or monitor treatment for MRSA infections.      Liver Function Tests:  Recent Labs Lab 10/20/16 2132  AST 21  ALT 22  ALKPHOS 84  BILITOT 1.3*  PROT 6.5  ALBUMIN 2.9*    Cardiac Enzymes: No results for input(s): CKTOTAL, CKMB, CKMBINDEX, TROPONINI in the last 168 hours. BNP (last 3 results)  Recent Labs  10/20/16 2132  BNP 345.8*    ProBNP (last 3 results) No results for input(s): PROBNP in the last 8760 hours.    Studies: No results found.  Scheduled Meds: . aspirin EC  81 mg Oral Daily  . clonazePAM  0.5 mg Oral QHS  . diltiazem  240 mg Oral QHS  . docusate sodium  100 mg Oral BID  . furosemide  20 mg Intravenous Q12H  . iron polysaccharides  150 mg Oral Daily  . leflunomide  10 mg Oral QHS  . levothyroxine  50 mcg Oral QAC breakfast  . magnesium oxide  400 mg Oral BID  . metoprolol succinate  25 mg Oral Daily  . multivitamin with minerals  1 tablet Oral Daily  . pantoprazole  40 mg Oral Daily  . pravastatin  40 mg Oral Daily  . warfarin  3 mg Oral ONCE-1800  . Warfarin - Pharmacist Dosing Inpatient   Does not apply q1800      Time spent: 20 min  [facility]   [facility] Pager [phone]. If 7PM-7AM, please contact night-coverage at [email or web address], Office  [phone]  password [email or web address]  10/23/2016, 12:36 PM  LOS: 1 day

## 2016-10-23 NOTE — Progress Notes (Signed)
ANTICOAGULATION CONSULT NOTE  Pharmacy Consult for Coumadin Indication: atrial fibrillation  Allergies  Allergen Reactions  . Cefuroxime Anaphylaxis and Other (See Comments)    Throat swelling  . Cephalexin Rash   Patient Measurements: Height: 5\' 2"  (157.5 cm) Weight: 154 lb 5.2 oz (70 kg) IBW/kg (Calculated) : 50.1  Assessment: 86 yoF on Coumadin PTA for AFib presents with bilateral leg pain and swelling. INR therapeutic on admit now subtherapeutic at 1.86 likely due to missed dose on 7/12 but currently trending up. Per MD no need to bridge while INR subtherapeutic at this time.  PTA Warfarin Dose = 3mg  daily  Goal of Therapy:  INR 2-3 Monitor platelets by anticoagulation protocol: Yes   Plan:  -Warfarin 3mg  PO x1 tonight -Monitor INR, S/Sx bleeding daily  9/12, PharmD PGY-2 Cardiology Pharmacy Resident Pager: [phone] 10/23/2016

## 2016-10-24 ENCOUNTER — Telehealth: Payer: Self-pay

## 2016-10-24 ENCOUNTER — Telehealth: Payer: Self-pay | Admitting: Family Medicine

## 2016-10-24 LAB — PROTIME-INR
INR: 2.14
PROTHROMBIN TIME: 24.2 s — AB (ref 11.4–15.2)

## 2016-10-24 LAB — BASIC METABOLIC PANEL
Anion gap: 9 (ref 5–15)
BUN: 39 mg/dL — AB (ref 6–20)
CO2: 27 mmol/L (ref 22–32)
Calcium: 8.2 mg/dL — ABNORMAL LOW (ref 8.9–10.3)
Chloride: 101 mmol/L (ref 101–111)
Creatinine, Ser: 1.45 mg/dL — ABNORMAL HIGH (ref 0.44–1.00)
GFR calc Af Amer: 37 mL/min — ABNORMAL LOW (ref >60)
GFR, EST NON AFRICAN AMERICAN: 32 mL/min — AB (ref >60)
GLUCOSE: 140 mg/dL — AB (ref 65–99)
POTASSIUM: 4.2 mmol/L (ref 3.5–5.1)
Sodium: 137 mmol/L (ref 135–145)

## 2016-10-24 LAB — GLUCOSE, CAPILLARY
GLUCOSE-CAPILLARY: 135 mg/dL — AB (ref 65–99)
Glucose-Capillary: 153 mg/dL — ABNORMAL HIGH (ref 65–99)

## 2016-10-24 MED ORDER — MAGNESIUM OXIDE 400 (241.3 MG) MG PO TABS: 400.0000 mg | ORAL_TABLET | Freq: Two times a day (BID) | ORAL | 2 refills | Status: DC

## 2016-10-24 MED ORDER — WARFARIN SODIUM 3 MG PO TABS: 3.0000 mg | ORAL_TABLET | Freq: Once | ORAL | Status: DC

## 2016-10-24 NOTE — Progress Notes (Signed)
Discharge instructions reviewed with pt and daughter verb understanding. Pt left stable condition via wheelchair in NAD.

## 2016-10-24 NOTE — Progress Notes (Signed)
ANTICOAGULATION CONSULT NOTE - Follow Up Consult  Pharmacy Consult for Coumadin Indication: atrial fibrillation  Allergies  Allergen Reactions  . Cefuroxime Anaphylaxis and Other (See Comments)    Throat swelling  . Cephalexin Rash   Patient Measurements: Height: 5\' 2"  (157.5 cm) Weight: 154 lb 5.2 oz (70 kg) IBW/kg (Calculated) : 50.1  Assessment: 86 yof continues on coumadin for afib. INR therapeutic at 2.14. CBC stable. No bleeding.  PTA dose = 3mg  daily  Goal of Therapy:  INR 2-3 Monitor platelets by anticoagulation protocol: Yes   Plan:  1) Coumadin 3mg  tonight 2) Daily INR  , PharmD, BCPS 10/24/2016

## 2016-10-24 NOTE — Telephone Encounter (Signed)
Just FYI:  Please check patients urine due to taking Allopurinol for her uric acid levels.  PT was in the hospital and her protine (INR) was checked 7/16 with a level of 2.14. She scheduled an appt for 8/14.

## 2016-10-24 NOTE — Discharge Summary (Signed)
Physician Discharge Summary  [name] [id number]:[id number] DOB: May 11, 1929 DOA: 10/20/2016  PCP: Rodolph Bong, MD  Admit date: 10/20/2016 Discharge date: 10/24/2016  Time spent: 35 minutes  Recommendations for Outpatient Follow-up:  1. Follow up PCP in 2 weeks   Discharge Diagnoses:  Principal Problem:   Atrial fibrillation with RVR (HCC) Active Problems:   Rheumatoid arthritis (HCC)   Diabetes mellitus with renal complications (HCC)   Acute on chronic diastolic CHF (congestive heart failure) (HCC)   Discharge Condition: Stable  Diet recommendation: Heart healthy diet  Filed Weights   10/21/16 0145 10/21/16 0151 10/22/16 0420  Weight: 70.7 kg (155 lb 13.8 oz) 70.7 kg (155 lb 13.8 oz) 70 kg (154 lb 5.2 oz)    History of present illness:  83 year old female with a history of atrial fibrillation on Cardizem, Coumadin, CHF, diabetes mellitus, hypertension came to hospital with bilateral leg pain. Patient developed A. fib with RVR in ED. Started on Cardizem GTT.   Hospital Course:  1. Atrial fibrillation with RVR-resolved, heart rate is controlled, patient was started on  Cardizem gtt., which has now been discontinued. Currently patient on  by mouth Cardizem, metoprolol. Continue Coumadin. INR is therapeutic. 2. Acute on chronic diastolic CHF- improved, patient developed bibasilar crackles O2 sats dropped to 80s while sleeping. She was started on Lasix 20 mg IV every 12 hours. Torsemide was held. At this time she's back to baseline and will restart torsemide 50 mg by mouth daily. .  3. Acute kidney injury-creatinine was elevated 1.83, from diuresis. Creatinine is now improved 1.45 after the cut down the dose of Lasix to  every 12 hours. Will restart ACE inhibitor as outpatient. 4. Rheumatoid arthritis-stable, continue Arava. 5. Hypothyroidism-stable, continue Synthroid 6. Peripheral neuropathy/bilateral leg pain- pain is well controlled, B12 level 847. Patient's leg pain much  improved after started on magnesium oxide 400 mg twice a day. Will discharge on magnesium oxide 400 mg by mouth twice a day. 7. Diabetes mellitus- diet controlled. Last hemoglobin A1c 7.1 few months back.  Procedures:  None  Consultations:  None  Discharge Exam: Vitals:   10/24/16 0345 10/24/16 0716  BP: 126/75 (!) 141/78  Pulse: 70 95  Resp: 16 20  Temp: 98.3 F (36.8 C) 97.8 F (36.6 C)    General: Appears in no acute distress Cardiovascular: S1-S2, irregular rhythm Respiratory: Clear to auscultation bilaterally  Discharge Instructions   Discharge Instructions    Diet - low sodium heart healthy    Complete by:  As directed    Increase activity slowly    Complete by:  As directed      Current Discharge Medication List    START taking these medications   Details  magnesium oxide (MAG-OX) 400 (241.3 Mg) MG tablet Take 1 tablet (400 mg total) by mouth 2 (two) times daily. Qty: 60 tablet, Refills: 2      CONTINUE these medications which have NOT CHANGED   Details  alendronate (FOSAMAX) 70 MG tablet Take 70 mg by mouth every Sunday.     allopurinol (ZYLOPRIM) 100 MG tablet Take 100 mg by mouth daily.    aspirin 81 MG EC tablet TAKE ONE TABLET BY MOUTH EVERY DAY Qty: 28 tablet, Refills: 2    Calcium Carb-Cholecalciferol 600-800 MG-UNIT TABS TAKE ONE TABLET BY MOUTH 2 TIMES A DAY Qty: 60 tablet, Refills: 6    clonazePAM (KLONOPIN) 0.5 MG tablet TAKE 1 TABLET BY MOUTH AT BEDTIME Qty: 14 tablet, Refills: 0  diltiazem (CARDIZEM CD) 240 MG 24 hr capsule TAKE 1 CAPSULE BY MOUTH AT BEDTIME Qty: 14 capsule, Refills: 23    Ergocalciferol (VITAMIN D2) 2000 units TABS Take 2,000 Units by mouth daily.     FERREX 150 150 MG capsule TAKE ONE CAPSULE BY MOUTH EVERY DAY Qty: 28 capsule, Refills: 2    leflunomide (ARAVA) 10 MG tablet TAKE 1 TABLET BY MOUTH AT BEDTIME Qty: 14 tablet, Refills: 4    levothyroxine (SYNTHROID, LEVOTHROID) 50 MCG tablet Take 50 mcg by  mouth daily.      lisinopril (PRINIVIL,ZESTRIL) 20 MG tablet TAKE 1 TABLET BY MOUTH ONCE DAILY Qty: 28 tablet, Refills: 10    metoprolol succinate (TOPROL-XL) 25 MG 24 hr tablet TAKE 1 TABLET BY MOUTH ONCE DAILY Qty: 14 tablet, Refills: 23   Associated Diagnoses: Chronic atrial fibrillation (HCC)    pantoprazole (PROTONIX) 40 MG tablet TAKE 1 TABLET BY MOUTH ONCE DAILY Qty: 28 tablet, Refills: 10    pravastatin (PRAVACHOL) 40 MG tablet TAKE 1 TABLET BY MOUTH ONCE DAILY Qty: 14 tablet, Refills: 23    predniSONE (DELTASONE) 5 MG tablet Take 1-2 tablets (5-10 mg total) by mouth 2 (two) times daily as needed (Rheumatoid flair). Qty: 30 tablet, Refills: 2    STOOL SOFTENER 100 MG capsule TAKE 1 CAPSULE BY MOUTH 2 TIMES A DAY (AM & PM) Qty: 56 capsule, Refills: 0    torsemide (DEMADEX) 100 MG tablet TAKE 1/2 TABLET BY MOUTH ONCE DAILY Qty: 7 tablet, Refills: 23    traMADol (ULTRAM) 50 MG tablet TAKE 1 TABLET BY MOUTH TWICE A DAY TAKE 1 TABLET BY MOUTH TWICE A DAYAS NEEDED FOR PAIN Qty: 56 tablet, Refills: 4    vitamin B-12 (CYANOCOBALAMIN) 1000 MCG tablet Take 1,000 mcg by mouth daily.    warfarin (COUMADIN) 3 MG tablet TAKE 1 TABLET BY MOUTH ONCE A DAY Qty: 14 tablet, Refills: 23    glucose blood test strip Once daily E11.29 Qty: 100 each, Refills: 12    Multiple Vitamin (MULTIVITAMIN WITH MINERALS) TABS tablet Take 1 tablet by mouth daily.       Allergies  Allergen Reactions  . Cefuroxime Anaphylaxis and Other (See Comments)    Throat swelling  . Cephalexin Rash      The results of significant diagnostics from this hospitalization (including imaging, microbiology, ancillary and laboratory) are listed below for reference.    Significant Diagnostic Studies: Dg Chest 2 View  Result Date: 10/20/2016 CLINICAL DATA:  Shortness of breath. Bilateral leg pain and swelling. EXAM: CHEST  2 VIEW COMPARISON:  05/17/2016 FINDINGS: Post median sternotomy. Stable cardiomegaly and  mediastinal contours with tortuosity and atherosclerosis of thoracic aorta. Chronic elevation of left hemidiaphragm. No pulmonary edema. Chronic bronchial thickening. No confluent airspace disease, pleural effusion or pneumothorax. The bones are under mineralized. IMPRESSION: 1. Stable cardiomegaly.  Unchanged tortuous atherosclerotic aorta. 2. Chronic bronchial thickening. Chronic elevation of left hemidiaphragm. 3. No acute abnormality. Electronically Signed   By: Rubye Oaks M.D.   On: 10/20/2016 21:20    Microbiology: Recent Results (from the past 240 hour(s))  MRSA PCR Screening     Status: None   Collection Time: 10/21/16  2:15 AM  Result Value Ref Range Status   MRSA by PCR NEGATIVE NEGATIVE Final    Comment:        The GeneXpert MRSA Assay (FDA approved for NASAL specimens only), is one component of a comprehensive MRSA colonization surveillance program. It is not intended to diagnose MRSA  infection nor to guide or monitor treatment for MRSA infections.   Culture, Urine     Status: Abnormal (Preliminary result)   Collection Time: 10/22/16 11:25 PM  Result Value Ref Range Status   Specimen Description URINE, RANDOM  Final   Special Requests NONE  Final   Culture >=100,000 COLONIES/mL GRAM NEGATIVE RODS (A)  Final   Report Status PENDING  Incomplete     Labs: Basic Metabolic Panel:  Recent Labs Lab 10/20/16 2132 10/21/16 0152 10/23/16 0311 10/24/16 0126  NA 137 138 136 137  K 4.3 4.5 4.0 4.2  CL 102 101 101 101  CO2 28 27 24 27   GLUCOSE 128* 149* 156* 140*  BUN 18 18 49* 39*  CREATININE 1.02* 1.04* 1.83* 1.45*  CALCIUM 9.3 9.2 8.1* 8.2*   Liver Function Tests:  Recent Labs Lab 10/20/16 2132  AST 21  ALT 22  ALKPHOS 84  BILITOT 1.3*  PROT 6.5  ALBUMIN 2.9*   No results for input(s): LIPASE, AMYLASE in the last 168 hours. No results for input(s): AMMONIA in the last 168 hours. CBC:  Recent Labs Lab 10/20/16 2132 10/22/16 0444  WBC 10.3 8.0   NEUTROABS 7.1  --   HGB 11.9* 10.9*  HCT 38.0 34.8*  MCV 87.2 87.0  PLT 298 248   Cardiac Enzymes: No results for input(s): CKTOTAL, CKMB, CKMBINDEX, TROPONINI in the last 168 hours. BNP: BNP (last 3 results)  Recent Labs  10/20/16 2132  BNP 345.8*    ProBNP (last 3 results) No results for input(s): PROBNP in the last 8760 hours.  CBG:  Recent Labs Lab 10/23/16 1753 10/24/16 0820  GLUCAP 133* 153*       Signed:  10/26/16 S MD.  [facility] 10/24/2016, 10:20 AM

## 2016-10-24 NOTE — Telephone Encounter (Signed)
Patient is being discharged from the hospital.  We will contact pt and schedule follow up with me soon.  It does not need to be today.

## 2016-10-24 NOTE — Telephone Encounter (Signed)
This note was opened in error.

## 2016-10-25 ENCOUNTER — Ambulatory Visit: Payer: Medicare PPO | Admitting: Family Medicine

## 2016-10-25 LAB — URINE CULTURE

## 2016-10-25 NOTE — Telephone Encounter (Signed)
Pt scheduled for 7/19

## 2016-10-27 ENCOUNTER — Inpatient Hospital Stay: Payer: Medicare PPO | Admitting: Family Medicine

## 2016-10-31 ENCOUNTER — Other Ambulatory Visit: Payer: Self-pay | Admitting: Family Medicine

## 2016-11-02 ENCOUNTER — Ambulatory Visit (INDEPENDENT_AMBULATORY_CARE_PROVIDER_SITE_OTHER): Payer: Medicare PPO | Admitting: Family Medicine

## 2016-11-02 ENCOUNTER — Encounter: Payer: Self-pay | Admitting: Family Medicine

## 2016-11-02 ENCOUNTER — Ambulatory Visit (INDEPENDENT_AMBULATORY_CARE_PROVIDER_SITE_OTHER): Payer: Medicare PPO

## 2016-11-02 VITALS — BP 150/67 | HR 76 | Wt 162.0 lb

## 2016-11-02 DIAGNOSIS — I4891 Unspecified atrial fibrillation: Secondary | ICD-10-CM

## 2016-11-02 DIAGNOSIS — I700 Atherosclerosis of aorta: Secondary | ICD-10-CM | POA: Diagnosis not present

## 2016-11-02 DIAGNOSIS — R079 Chest pain, unspecified: Secondary | ICD-10-CM

## 2016-11-02 DIAGNOSIS — I517 Cardiomegaly: Secondary | ICD-10-CM | POA: Diagnosis not present

## 2016-11-02 LAB — POCT INR: INR: 5.5

## 2016-11-02 IMAGING — DX DG CHEST 2V
2 series · 2 of 2 positions shown · non-contrast
Comparison: [DATE]; [DATE]; [DATE]

CLINICAL DATA: Right-sided chest pain radiating to the scapula for
the past week. History of hypertension, diabetes, DVT, CHF and CABG.

EXAM:
CHEST  2 VIEW

[chest pa]
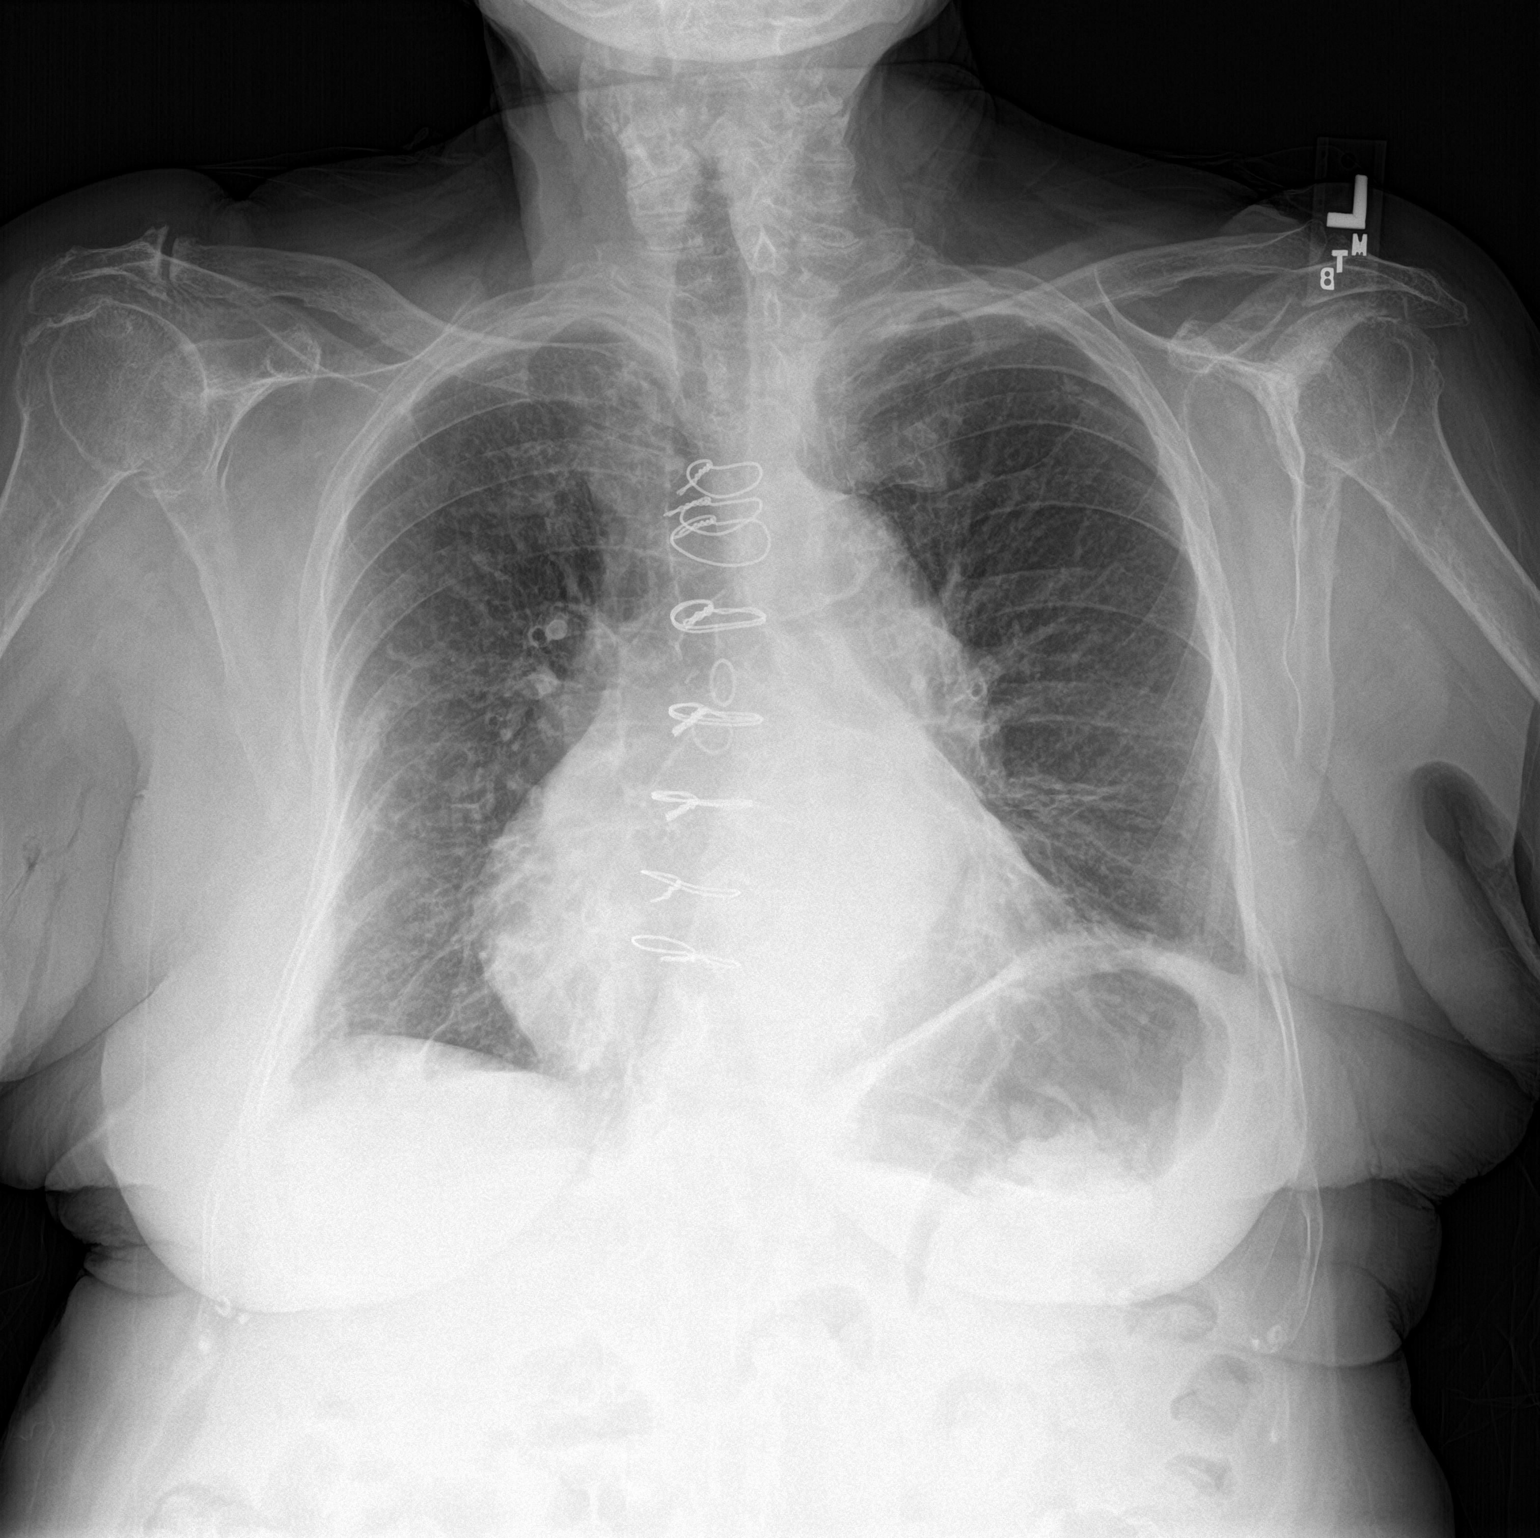

[chest lat]
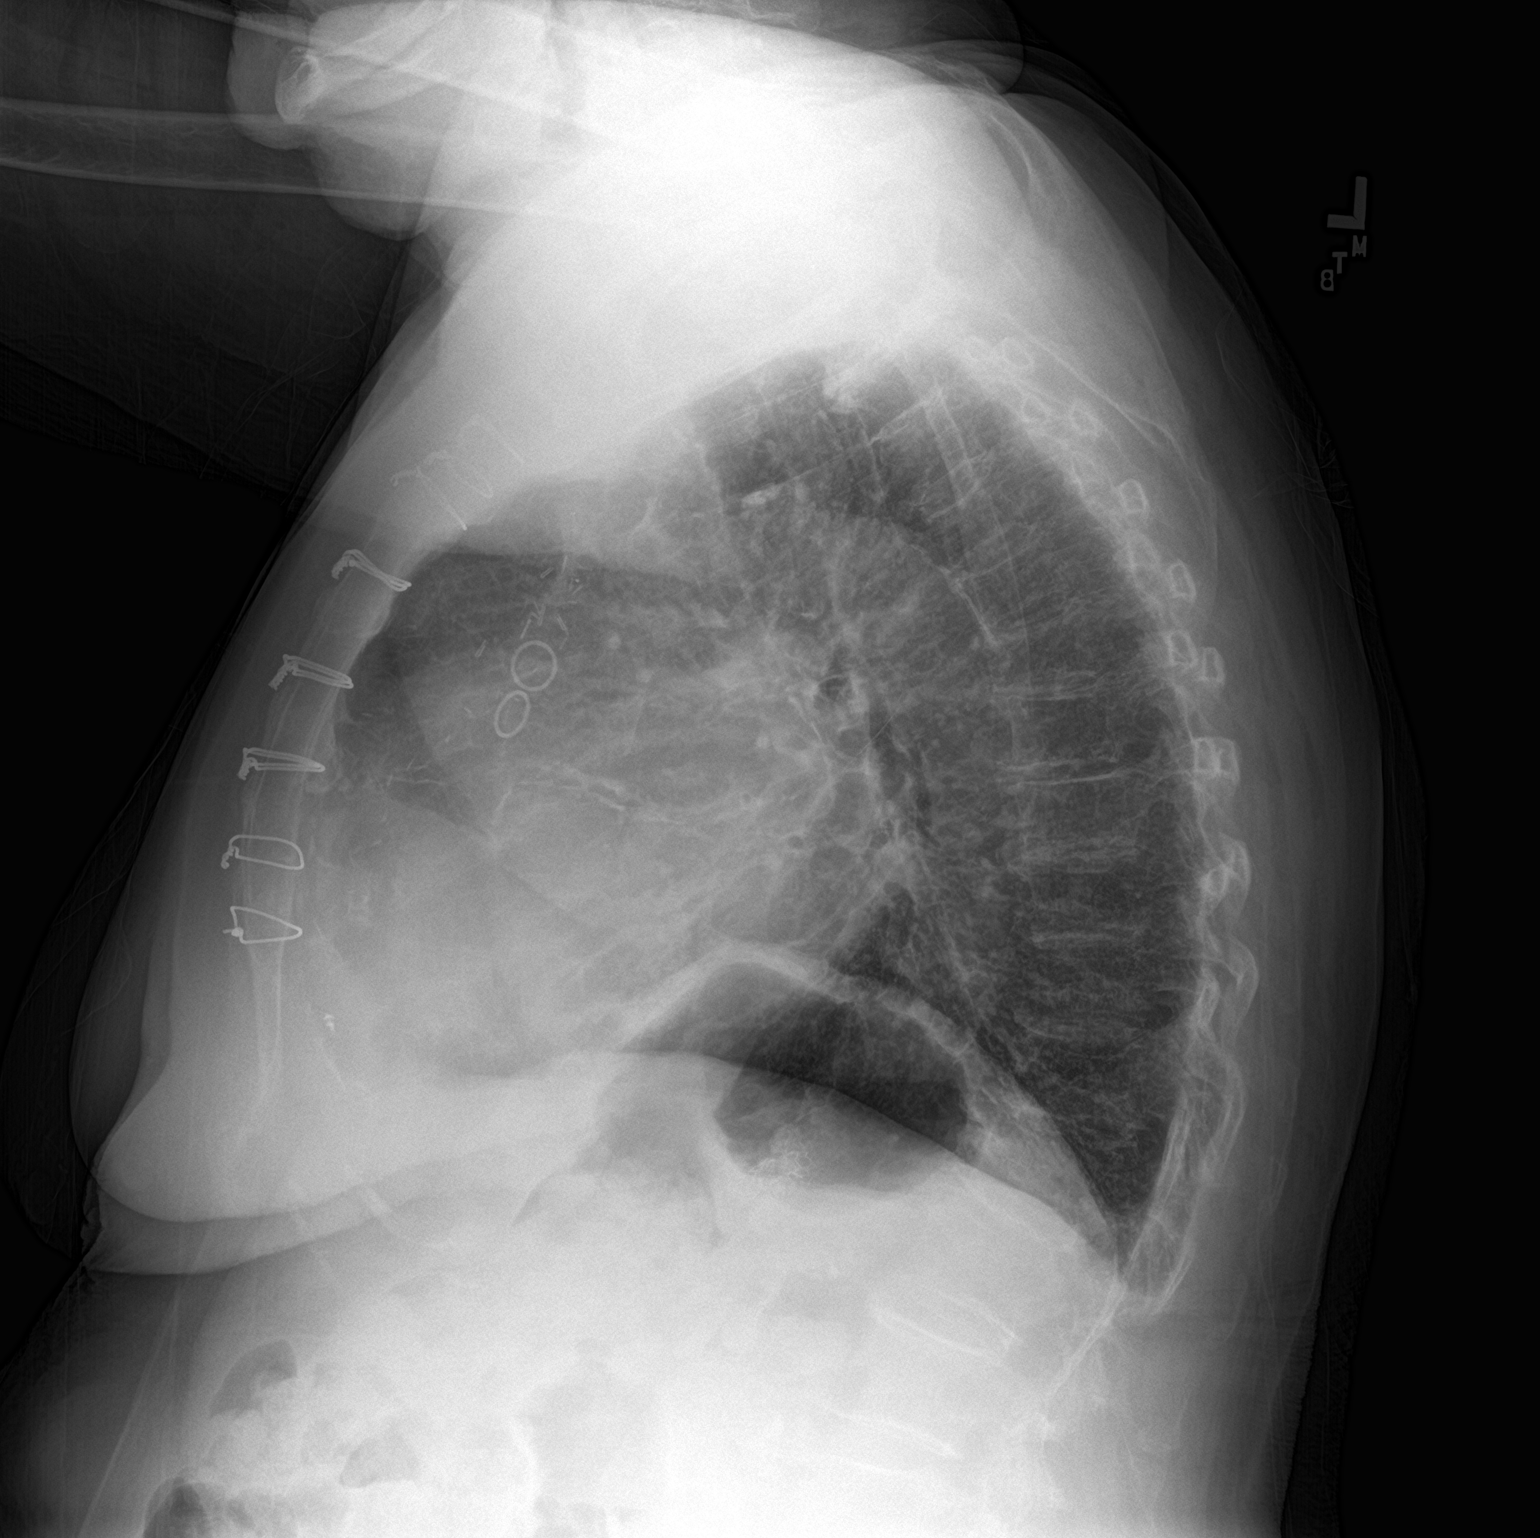

[2 of 2 positions shown; findings below may reference images not displayed]

FINDINGS: Grossly unchanged enlarged cardiac silhouette and mediastinal
contours post median sternotomy and CABG. Atherosclerotic plaque
within the thoracic aorta. Pulmonary vasculature appears less
distinct with cephalization of flow. Chronic elevation of the left
hemidiaphragm with bilateral infrahilar opacities, left greater than
right. No pleural effusion or pneumothorax. No acute osseus
abnormalities.
IMPRESSION: 1. Cardiomegaly with findings suggestive of mild pulmonary edema.
2.  Aortic Atherosclerosis ([E0]-[E0]).

## 2016-11-02 MED ORDER — WARFARIN SODIUM 3 MG PO TABS: ORAL_TABLET | ORAL | 1 refills | Status: DC

## 2016-11-02 MED ORDER — DILTIAZEM HCL ER COATED BEADS 240 MG PO CP24: 240.0000 mg | ORAL_CAPSULE | Freq: Every day | ORAL | 1 refills | Status: DC

## 2016-11-02 NOTE — Progress Notes (Signed)
[name] is a 83 y.o. female who presents to [name]: Primary Care Sports Medicine today for follow up recent hospitalization.   [name] was recently seen in the hospital for leg pain and swelling though to be due to Afib with RVR. She improved with diltiazem and diuresis. She notes that her legs no longer bother her.   However she notes that since her hospitalization she has right sided chest pain worth with deep inspiration. She denies any significant change in shortness of breath or dyspnea.  She feels well otherwise no vomiting or diarrhea.  Anticoagulation: Patient has a history of atrial fibrillation and is anticoagulated with warfarin. She has been maintained on a steady dose of 3 mg of warfarin daily. She was started on allopurinol relatively recently. This is used to combat chronic gout.  Gout: Patient is a history of gout with tophi. Her uric acid was checked recently and found to be elevated. Her rheumatologist prescribed allopurinol and effort to reduce uric acid levels. She's not sure if this has helped at all.     Past Medical History:  Diagnosis Date  . Acute pancreatitis   . Anemia   . Arthritis   . Atrial fibrillation (HCC)   . CAD (coronary artery disease)   . Carotid artery occlusion   . CHF (congestive heart failure) (HCC)   . Diabetes mellitus age 79  . DJD (degenerative joint disease)   . DVT (deep venous thrombosis) (HCC)   . GERD (gastroesophageal reflux disease)   . GI bleed   . Hypertension   . Joint pain   . Mesenteric ischemia   . Peripheral vascular disease (HCC)   . Thyroid disease    Past Surgical History:  Procedure Laterality Date  . APPENDECTOMY    . CELIAC ARTERY STENT    . CORONARY ARTERY BYPASS GRAFT    . EMBOLECTOMY  2009   right leg  . FASCIOTOMY     right leg  . HIP FRACTURE SURGERY Left   . JOINT REPLACEMENT    . TOTAL KNEE  ARTHROPLASTY     bilateral  . TUBAL LIGATION    . VISCERAL ANGIOGRAM N/A 05/31/2013   Procedure: MESSENTERIC [name];  Surgeon: [name], MD;  Location: [facility] CATH LAB;  Service: Cardiovascular;  Laterality: N/A;   Social History  Substance Use Topics  . Smoking status: Never Smoker  . Smokeless tobacco: Never Used  . Alcohol use No   family history includes Cancer in her daughter and mother; Coronary artery disease in her sister; Diabetes in her daughter, sister, and son; Heart disease in her sister; Hyperlipidemia in her son; Hypertension in her daughter and son; Other in her father and sister.  ROS as above:  Medications: Current Outpatient Prescriptions  Medication Sig Dispense Refill  . alendronate (FOSAMAX) 70 MG tablet Take 70 mg by mouth every Sunday.     Marland Kitchen aspirin 81 MG EC tablet TAKE ONE TABLET BY MOUTH EVERY DAY (Patient taking differently: TAKE 81 MG BY MOUTH EVERY DAY AT BEDTIME) 28 tablet 2  . Calcium Carb-Cholecalciferol 600-800 MG-UNIT TABS TAKE ONE TABLET BY MOUTH 2 TIMES A DAY 60 tablet 6  . clonazePAM (KLONOPIN) 0.5 MG tablet TAKE 1 TABLET BY MOUTH AT BEDTIME (Patient taking differently: TAKE 0.5 MG BY MOUTH AT BEDTIME) 14 tablet 0  . diltiazem (CARDIZEM CD) 240 MG 24 hr capsule Take 1 capsule (240 mg total) by mouth at bedtime. 90  capsule 1  . Ergocalciferol (VITAMIN D2) 2000 units TABS Take 2,000 Units by mouth daily.     Marland Kitchen FERREX 150 150 MG capsule TAKE ONE CAPSULE BY MOUTH EVERY DAY (Patient taking differently: TAKE 150 MG BY MOUTH EVERY DAY) 28 capsule 2  . glucose blood test strip Once daily E11.29 100 each 12  . ketoconazole (NIZORAL) 2 % cream Apply topically.    Marland Kitchen leflunomide (ARAVA) 10 MG tablet TAKE 1 TABLET BY MOUTH AT BEDTIME (Patient taking differently: TAKE 10 MG BY MOUTH AT BEDTIME) 14 tablet 4  . levothyroxine (SYNTHROID, LEVOTHROID) 50 MCG tablet Take 50 mcg by mouth daily.      Marland Kitchen lisinopril (PRINIVIL,ZESTRIL) 20 MG tablet TAKE 1 TABLET BY  MOUTH ONCE DAILY (Patient taking differently: TAKE 20 MG BY MOUTH ONCE DAILY) 28 tablet 10  . magnesium oxide (MAG-OX) 400 (241.3 Mg) MG tablet Take 1 tablet (400 mg total) by mouth 2 (two) times daily. 60 tablet 2  . metoprolol succinate (TOPROL-XL) 25 MG 24 hr tablet TAKE 1 TABLET BY MOUTH ONCE DAILY (Patient taking differently: TAKE 25 MG BY MOUTH ONCE DAILY) 14 tablet 23  . Multiple Vitamin (MULTIVITAMIN WITH MINERALS) TABS tablet Take 1 tablet by mouth daily.    . pantoprazole (PROTONIX) 40 MG tablet TAKE 1 TABLET BY MOUTH ONCE DAILY (Patient taking differently: TAKE 40 MG BY MOUTH ONCE DAILY) 28 tablet 10  . pravastatin (PRAVACHOL) 40 MG tablet TAKE 1 TABLET BY MOUTH ONCE DAILY (Patient taking differently: TAKE 40 MG BY MOUTH ONCE DAILY) 14 tablet 23  . predniSONE (DELTASONE) 5 MG tablet Take 1 or 2 tablets by mouth two times a day as needed for rheumatoid flair  Due for follow up visit 30 tablet 0  . STOOL SOFTENER 100 MG capsule TAKE 1 CAPSULE BY MOUTH 2 TIMES A DAY (AM & PM) (Patient taking differently: TAKE 100 MG BY MOUTH 2 TIMES A DAY (AM & PM)) 56 capsule 0  . torsemide (DEMADEX) 100 MG tablet TAKE 1/2 TABLET BY MOUTH ONCE DAILY (Patient taking differently: TAKE 50 MG BY MOUTH ONCE DAILY) 7 tablet 23  . traMADol (ULTRAM) 50 MG tablet TAKE 1 TABLET BY MOUTH TWICE A DAY TAKE 1 TABLET BY MOUTH TWICE A DAYAS NEEDED FOR PAIN (Patient taking differently: Take 50 mg by mouth twice daily as needed for pain) 56 tablet 4  . vitamin B-12 (CYANOCOBALAMIN) 1000 MCG tablet Take 1,000 mcg by mouth daily.    Marland Kitchen warfarin (COUMADIN) 3 MG tablet Take every other day for 2 weeks then return to clinic for recheck. 30 tablet 1   No current facility-administered medications for this visit.    Allergies  Allergen Reactions  . Cefuroxime Anaphylaxis and Other (See Comments)    Throat swelling  . Cephalexin Rash    Health Maintenance Health Maintenance  Topic Date Due  . OPHTHALMOLOGY EXAM  07/13/2016  .  INFLUENZA VACCINE  12/29/2016 (Originally 11/09/2016)  . HEMOGLOBIN A1C  01/25/2017  . FOOT EXAM  07/26/2017  . TETANUS/TDAP  12/28/2025  . PNA vac Low Risk Adult  Completed     Exam:  BP (!) 150/67   Pulse 76   Wt 162 lb (73.5 kg)   SpO2 94%   BMI 29.63 kg/m  Gen: Well NAD HEENT: EOMI,  MMM Lungs: Normal work of breathing. CTABL Chest wall: Not particularly tender right lateral chest wall Heart: Regular rate no MRG Abd: NABS, Soft. Nondistended, Nontender Exts: Brisk capillary refill, warm and well  perfused. No edema Right elbow tophi olecranon bursa right elbow   Results for orders placed or performed in visit on 11/02/16 (from the past 72 hour(s))  POCT INR     Status: None   Collection Time: 11/02/16  2:35 PM  Result Value Ref Range   INR 5.5     Lab Results  Component Value Date   LABURIC 9.1 (H) 10/23/2016    Chest x-ray shows cardiomegaly with chronic bronchitic changes. No obvious right-sided pneumonia or infiltrate. No obvious rib fractures or pleural effusion or pneumothorax. Awaiting formal radiology review    Assessment and Plan: 83 y.o. female with  Right-sided rib pain: Likely strain. Plan for watchful waiting. Chest x-ray pending.  Elevated INR: Likely due to interaction with allopurinol and warfarin. Plan to stop allopurinol temporarily and reduce warfarin to 3 mg twice daily. Recheck in 2 weeks.  Chronic gout: Uric acid elevated. Patient has tophi. I agree with uric acid lowering. However it seems to be causing interaction with warfarin. We may consider restarting allopurinol in 2 weeks with a lower dose of warfarin if needed.  A fibrillation rate controlled today. No edema. Continue current regimen   Orders Placed This Encounter  Procedures  . DG Chest 2 View    Order Specific Question:   Reason for exam:    Answer:   Cough, assess intra-thoracic pathology    Order Specific Question:   Preferred imaging location?    Answer:   [facility]  . POCT INR   Meds ordered this encounter  Medications  . ketoconazole (NIZORAL) 2 % cream    Sig: Apply topically.  . warfarin (COUMADIN) 3 MG tablet    Sig: Take every other day for 2 weeks then return to clinic for recheck.    Dispense:  30 tablet    Refill:  1  . diltiazem (CARDIZEM CD) 240 MG 24 hr capsule    Sig: Take 1 capsule (240 mg total) by mouth at bedtime.    Dispense:  90 capsule    Refill:  1     Discussed warning signs or symptoms. Please see discharge instructions. Patient expresses understanding.  CC: Dr Nickola Major

## 2016-11-02 NOTE — Patient Instructions (Signed)
Thank you for coming in today. STOP allopurinol.  Take warfarin 3mg  every other day for 2 weeks then return for recheck with Dr .    Return in 2 weeks to recheck rib pain as well as warfarin level (INR).  Return sooner if needed.    Call or go to the emergency room if you get worse, have trouble breathing, have chest pains, or palpitations.

## 2016-11-03 ENCOUNTER — Encounter: Payer: Self-pay | Admitting: Family Medicine

## 2016-11-03 DIAGNOSIS — I700 Atherosclerosis of aorta: Secondary | ICD-10-CM | POA: Insufficient documentation

## 2016-11-04 ENCOUNTER — Telehealth: Payer: Self-pay

## 2016-11-04 NOTE — Telephone Encounter (Signed)
Blood pressure is probably erroneous measurement. Patient had pretty good blood pressure in clinic the other day. Plan to recheck at assisted living. If still elevated check again next week. Please do not go to the emergency room unless she is very sick. Return to our clinic as needed.

## 2016-11-04 NOTE — Telephone Encounter (Signed)
Called to check on pt she was not in her apartment. I was transferred to the director of nursing and left a vm for a call back. -EH/RMA

## 2016-11-04 NOTE — Telephone Encounter (Signed)
Received a called for pt [employer] nurse.  She three wayed our office with [name] on the phone.  This nurse calls [name] every month to check on her. This morning [name] c/o elevated BP. She said it was 189/145.  Denied dizziness, headache, and blurred vision. She said that she took both her lisinopril and metoprolol this morning shortly after waking up.  I asked was there a way for her to come in the office for a nurse visit or stop by a local pharmacy to check bp. She said that she does not drive and that would be hard for her to do. She then told me that she stays in assistant living.  I suggest that she call the front desk and request for nurse or CNA to stop by her apartment and check BP and report back to our office. -EH/RMA

## 2016-11-04 NOTE — Telephone Encounter (Signed)
Pt's nurse called and said that the pt wanted Dr. Denyse Amass to know that her BP was 106/51 and her pulse was 87. Please Advise? Returned call and left a voicemail for nurse to return call.

## 2016-11-05 NOTE — Telephone Encounter (Signed)
Sounds fine, wish mine was that good.  She doesn't feel any orthostasis though right?  Symptoms are more important than the actual blood pressure number.

## 2016-11-07 NOTE — Telephone Encounter (Signed)
Patient's nurse called again and she feels the patient's blood pressure is within normal range it was 132/72 just a short while ago. The patient is not experiencing any sob, anxiety, chest pain

## 2016-11-15 ENCOUNTER — Emergency Department (INDEPENDENT_AMBULATORY_CARE_PROVIDER_SITE_OTHER)
Admission: EM | Admit: 2016-11-15 | Discharge: 2016-11-15 | Disposition: A | Discharge status: Home / Self Care | Payer: Medicare PPO | Source: Home / Self Care | Attending: Family Medicine | Admitting: Family Medicine

## 2016-11-15 ENCOUNTER — Encounter: Payer: Self-pay | Admitting: *Deleted

## 2016-11-15 DIAGNOSIS — R300 Dysuria: Secondary | ICD-10-CM

## 2016-11-15 DIAGNOSIS — N3001 Acute cystitis with hematuria: Secondary | ICD-10-CM

## 2016-11-15 LAB — POCT URINALYSIS DIP (MANUAL ENTRY)
Bilirubin, UA: NEGATIVE
Glucose, UA: NEGATIVE mg/dL
Ketones, POC UA: NEGATIVE mg/dL
Nitrite, UA: NEGATIVE
Spec Grav, UA: 1.015 (ref 1.010–1.025)
Urobilinogen, UA: 0.2 E.U./dL
pH, UA: 7 (ref 5.0–8.0)

## 2016-11-15 MED ORDER — NITROFURANTOIN MONOHYD MACRO 100 MG PO CAPS: 100.0000 mg | ORAL_CAPSULE | Freq: Two times a day (BID) | ORAL | 0 refills | Status: DC

## 2016-11-15 NOTE — ED Triage Notes (Signed)
Patient c/o HA, weakness, confusion and dysuria since yesterday. Afebrile.

## 2016-11-15 NOTE — ED Provider Notes (Signed)
[facility]    CSN: [id number] Arrival date & time: 11/15/16  1431     History   Chief Complaint Chief Complaint  Patient presents with  . Dysuria  . Headache  . Weakness    HPI [name] is a 83 y.o. female.   HPI [name] is a 83 y.o. female presenting to UC with daughter with c/o 3 weeks of gradually worsening dysuria, urinary frequency, and worsened suddenly yesterday with mild confusion, generalized headache and weakness.  Pt was admitted ot the hospital about 3 weeks ago for excess fluid due to CHF but states the urine there only showed a possible UTI but not enough to be treated with antibiotics.  Pt has had similar symptoms with UTIs in the past. Allergic to Keflex but per medical records, pt had a UTI about 1 year ago, was treated with Macrobid. Denies fever, n/v/d.    Past Medical History:  Diagnosis Date  . Acute pancreatitis   . Anemia   . Arthritis   . Atrial fibrillation (HCC)   . CAD (coronary artery disease)   . Carotid artery occlusion   . CHF (congestive heart failure) (HCC)   . Diabetes mellitus age 89  . DJD (degenerative joint disease)   . DVT (deep venous thrombosis) (HCC)   . GERD (gastroesophageal reflux disease)   . GI bleed   . Hypertension   . Joint pain   . Mesenteric ischemia   . Peripheral vascular disease (HCC)   . Thyroid disease     Patient Active Problem List   Diagnosis Date Noted  . Aortic atherosclerosis (HCC) 11/03/2016  . Acute on chronic diastolic CHF (congestive heart failure) (HCC) 10/23/2016  . Atrial fibrillation with RVR (HCC) 10/20/2016  . Grief at loss of child 10/21/2015  . Chronic kidney disease, stage IV (severe) (HCC) 04/14/2015  . Colonic constipation 04/14/2015  . Diabetes mellitus with renal complications (HCC) 03/03/2015  . Atrophic vaginitis 02/24/2015  . Rheumatoid arthritis (HCC) 02/18/2015  . DNR (do not resuscitate) 02/13/2015  . Occlusion and stenosis of carotid artery  without mention of cerebral infarction 05/02/2013  . 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 FEMORAL BRUIT 11/24/2009  . Hypothyroidism 06/26/2009  . CEREBROVASCULAR DISEASE 06/25/2009  . ATRIAL FIBRILLATION 05/19/2008  . CHRONIC DIASTOLIC HEART FAILURE 05/19/2008  . RENAL ARTERY STENOSIS 05/19/2008  . PERIPHERAL VASCULAR DISEASE 05/19/2008    Past Surgical History:  Procedure Laterality Date  . APPENDECTOMY    . CELIAC ARTERY STENT    . CORONARY ARTERY BYPASS GRAFT    . EMBOLECTOMY  2009   right leg  . FASCIOTOMY     right leg  . HIP FRACTURE SURGERY Left   . JOINT REPLACEMENT    . TOTAL KNEE ARTHROPLASTY     bilateral  . TUBAL LIGATION    . VISCERAL ANGIOGRAM N/A 05/31/2013   Procedure: MESSENTERIC [name];  Surgeon: [name], MD;  Location: [facility] CATH LAB;  Service: Cardiovascular;  Laterality: N/A;    OB History    No data available       Home Medications    Prior to Admission medications   Medication Sig Start Date End Date Taking? Authorizing Provider  alendronate (FOSAMAX) 70 MG tablet Take 70 mg by mouth every Sunday.     [provider]  aspirin 81 MG EC tablet TAKE ONE TABLET BY MOUTH EVERY DAY Patient taking differently: TAKE 81 MG BY MOUTH EVERY DAY AT BEDTIME 09/11/15  Rodolph Bong, MD  Calcium Carb-Cholecalciferol 600-800 MG-UNIT TABS TAKE ONE TABLET BY MOUTH 2 TIMES A DAY 09/11/15   Rodolph Bong, MD  clonazePAM (KLONOPIN) 0.5 MG tablet TAKE 1 TABLET BY MOUTH AT BEDTIME Patient taking differently: TAKE 0.5 MG BY MOUTH AT BEDTIME 10/17/16   Rodolph Bong, MD  diltiazem (CARDIZEM CD) 240 MG 24 hr capsule Take 1 capsule (240 mg total) by mouth at bedtime. 11/02/16   Rodolph Bong, MD  Ergocalciferol (VITAMIN D2) 2000 units TABS Take 2,000 Units by mouth daily.     [provider]  FERREX 150 150 MG capsule TAKE ONE CAPSULE BY MOUTH EVERY DAY Patient taking differently: TAKE 150 MG BY MOUTH EVERY DAY 09/11/15   Rodolph Bong, MD    glucose blood test strip Once daily E11.29 03/03/15   Rodolph Bong, MD  ketoconazole (NIZORAL) 2 % cream Apply topically. 11/01/16 11/01/17  [provider]  leflunomide (ARAVA) 10 MG tablet TAKE 1 TABLET BY MOUTH AT BEDTIME Patient taking differently: TAKE 10 MG BY MOUTH AT BEDTIME 10/03/16   Rodolph Bong, MD  levothyroxine (SYNTHROID, LEVOTHROID) 50 MCG tablet Take 50 mcg by mouth daily.      [provider]  lisinopril (PRINIVIL,ZESTRIL) 20 MG tablet TAKE 1 TABLET BY MOUTH ONCE DAILY Patient taking differently: TAKE 20 MG BY MOUTH ONCE DAILY 03/02/16   Rodolph Bong, MD  magnesium oxide (MAG-OX) 400 (241.3 Mg) MG tablet Take 1 tablet (400 mg total) by mouth 2 (two) times daily. 10/24/16   Meredeth Ide, MD  metoprolol succinate (TOPROL-XL) 25 MG 24 hr tablet TAKE 1 TABLET BY MOUTH ONCE DAILY Patient taking differently: TAKE 25 MG BY MOUTH ONCE DAILY 04/18/16   Rodolph Bong, MD  Multiple Vitamin (MULTIVITAMIN WITH MINERALS) TABS tablet Take 1 tablet by mouth daily.    [provider]  nitrofurantoin, macrocrystal-monohydrate, (MACROBID) 100 MG capsule Take 1 capsule (100 mg total) by mouth 2 (two) times daily. X 5 days 11/15/16   Lurene Shadow, PA-C  pantoprazole (PROTONIX) 40 MG tablet TAKE 1 TABLET BY MOUTH ONCE DAILY Patient taking differently: TAKE 40 MG BY MOUTH ONCE DAILY 03/02/16   Rodolph Bong, MD  pravastatin (PRAVACHOL) 40 MG tablet TAKE 1 TABLET BY MOUTH ONCE DAILY Patient taking differently: TAKE 40 MG BY MOUTH ONCE DAILY 06/23/16   Rodolph Bong, MD  predniSONE (DELTASONE) 5 MG tablet Take 1 or 2 tablets by mouth two times a day as needed for rheumatoid flair  Due for follow up visit 10/31/16   Rodolph Bong, MD  STOOL SOFTENER 100 MG capsule TAKE 1 CAPSULE BY MOUTH 2 TIMES A DAY (AM & PM) Patient taking differently: TAKE 100 MG BY MOUTH 2 TIMES A DAY (AM & PM) 09/11/15   Rodolph Bong, MD  torsemide (DEMADEX) 100 MG tablet TAKE 1/2 TABLET BY MOUTH ONCE  DAILY Patient taking differently: TAKE 50 MG BY MOUTH ONCE DAILY 10/14/16   Rodolph Bong, MD  traMADol (ULTRAM) 50 MG tablet TAKE 1 TABLET BY MOUTH TWICE A DAY TAKE 1 TABLET BY MOUTH TWICE A DAYAS NEEDED FOR PAIN Patient taking differently: Take 50 mg by mouth twice daily as needed for pain 08/30/16   Rodolph Bong, MD  vitamin B-12 (CYANOCOBALAMIN) 1000 MCG tablet Take 1,000 mcg by mouth daily.    [provider]  warfarin (COUMADIN) 3 MG tablet Take every other day for 2 weeks then return  to clinic for recheck. 11/02/16   Rodolph Bong, MD    Family History Family History  Problem Relation Age of Onset  . Cancer Mother        ovarian  . Other Father        suicide  . Coronary artery disease Sister   . Other Sister        alzheimers  . Heart disease Sister        Before age 52  . Diabetes Sister   . Cancer Daughter        spinal tumor  . Diabetes Daughter   . Hypertension Daughter   . Diabetes Son   . Hyperlipidemia Son   . Hypertension Son     Social History Social History  Substance Use Topics  . Smoking status: Never Smoker  . Smokeless tobacco: Never Used  . Alcohol use No     Allergies   Cefuroxime and Cephalexin   Review of Systems Review of Systems  Constitutional: Negative for chills and fever.  Gastrointestinal: Negative for abdominal pain, diarrhea, nausea and vomiting.  Genitourinary: Positive for dysuria, frequency and urgency. Negative for hematuria and pelvic pain.  Musculoskeletal: Negative for back pain and myalgias.  Neurological: Positive for headaches. Negative for dizziness and light-headedness.  Psychiatric/Behavioral: Positive for confusion (mild).     Physical Exam Triage Vital Signs ED Triage Vitals  Enc Vitals Group     BP 11/15/16 1455 105/72     Pulse Rate 11/15/16 1455 99     Resp --      Temp 11/15/16 1455 99 F (37.2 C)     Temp Source 11/15/16 1455 Oral     SpO2 11/15/16 1455 95 %     Weight 11/15/16 1456 160 lb  (72.6 kg)     Height --      Head Circumference --      Peak Flow --      Pain Score 11/15/16 1456 8     Pain Loc --      Pain Edu? --      Excl. in GC? --    No data found.   Updated Vital Signs BP 105/72 (BP Location: Left Arm)   Pulse 99   Temp 99 F (37.2 C) (Oral)   Wt 160 lb (72.6 kg)   SpO2 95%   BMI 29.26 kg/m      Physical Exam  Constitutional: She is oriented to person, place, and time. She appears well-developed and well-nourished. No distress.  HENT:  Head: Normocephalic and atraumatic.  Mouth/Throat: Oropharynx is clear and moist.  Eyes: EOM are normal.  Neck: Normal range of motion.  Cardiovascular: Normal rate and regular rhythm.   Pulmonary/Chest: Effort normal and breath sounds normal. No respiratory distress. She has no wheezes. She has no rales.  Abdominal: Soft. She exhibits no distension. There is no tenderness.  Musculoskeletal: Normal range of motion.  Neurological: She is alert and oriented to person, place, and time.  Skin: Skin is warm and dry. She is not diaphoretic.  Psychiatric: She has a normal mood and affect. Her behavior is normal.  Nursing note and vitals reviewed.    UC Treatments / Results  Labs (all labs ordered are listed, but only abnormal results are displayed) Labs Reviewed  POCT URINALYSIS DIP (MANUAL ENTRY) - Abnormal; Notable for the following:       Result Value   Clarity, UA cloudy (*)    Blood, UA small (*)  Protein Ur, POC trace (*)    Leukocytes, UA Large (3+) (*)    All other components within normal limits  URINE CULTURE    EKG  EKG Interpretation None       Radiology No results found.  Procedures Procedures (including critical care time)  Medications Ordered in UC Medications - No data to display   Initial Impression / Assessment and Plan / UC Course  I have reviewed the triage vital signs and the nursing notes.  Pertinent labs & imaging results that were available during my care of the  patient were reviewed by me and considered in my medical decision making (see chart for details).     UA c/w UTI Culture sent   Final Clinical Impressions(s) / UC Diagnoses   Final diagnoses:  Dysuria  Acute cystitis with hematuria   F/u with PCP in 1 week if not improving.   New Prescriptions Discharge Medication List as of 11/15/2016  3:02 PM    START taking these medications   Details  nitrofurantoin, macrocrystal-monohydrate, (MACROBID) 100 MG capsule Take 1 capsule (100 mg total) by mouth 2 (two) times daily. X 5 days, Starting Tue 11/15/2016, Normal         Controlled Substance Prescriptions [place] Controlled Substance Registry consulted? Not Applicable   Rolla Plate 11/15/16 1558

## 2016-11-17 ENCOUNTER — Encounter (HOSPITAL_COMMUNITY): Payer: Self-pay | Admitting: Emergency Medicine

## 2016-11-17 ENCOUNTER — Encounter: Payer: Self-pay | Admitting: Family Medicine

## 2016-11-17 ENCOUNTER — Inpatient Hospital Stay (HOSPITAL_COMMUNITY)
Admission: EM | Admit: 2016-11-17 | Discharge: 2016-11-20 | DRG: 309 | Disposition: A | Discharge status: Home Health | Payer: Medicare PPO | Attending: Internal Medicine | Admitting: Internal Medicine

## 2016-11-17 ENCOUNTER — Ambulatory Visit (INDEPENDENT_AMBULATORY_CARE_PROVIDER_SITE_OTHER): Payer: Medicare PPO | Admitting: Family Medicine

## 2016-11-17 ENCOUNTER — Emergency Department (HOSPITAL_COMMUNITY): Payer: Medicare PPO

## 2016-11-17 VITALS — BP 127/89 | HR 112 | Temp 98.4°F | Ht 62.0 in | Wt 168.0 lb

## 2016-11-17 DIAGNOSIS — I5032 Chronic diastolic (congestive) heart failure: Secondary | ICD-10-CM | POA: Diagnosis present

## 2016-11-17 DIAGNOSIS — F0390 Unspecified dementia without behavioral disturbance: Secondary | ICD-10-CM | POA: Diagnosis present

## 2016-11-17 DIAGNOSIS — Z66 Do not resuscitate: Secondary | ICD-10-CM | POA: Diagnosis present

## 2016-11-17 DIAGNOSIS — Z79891 Long term (current) use of opiate analgesic: Secondary | ICD-10-CM

## 2016-11-17 DIAGNOSIS — I480 Paroxysmal atrial fibrillation: Principal | ICD-10-CM | POA: Diagnosis present

## 2016-11-17 DIAGNOSIS — Z833 Family history of diabetes mellitus: Secondary | ICD-10-CM

## 2016-11-17 DIAGNOSIS — E785 Hyperlipidemia, unspecified: Secondary | ICD-10-CM | POA: Diagnosis present

## 2016-11-17 DIAGNOSIS — D649 Anemia, unspecified: Secondary | ICD-10-CM | POA: Diagnosis present

## 2016-11-17 DIAGNOSIS — K219 Gastro-esophageal reflux disease without esophagitis: Secondary | ICD-10-CM | POA: Diagnosis present

## 2016-11-17 DIAGNOSIS — E1129 Type 2 diabetes mellitus with other diabetic kidney complication: Secondary | ICD-10-CM | POA: Diagnosis present

## 2016-11-17 DIAGNOSIS — Z7901 Long term (current) use of anticoagulants: Secondary | ICD-10-CM

## 2016-11-17 DIAGNOSIS — Z881 Allergy status to other antibiotic agents status: Secondary | ICD-10-CM

## 2016-11-17 DIAGNOSIS — Z79899 Other long term (current) drug therapy: Secondary | ICD-10-CM

## 2016-11-17 DIAGNOSIS — N184 Chronic kidney disease, stage 4 (severe): Secondary | ICD-10-CM | POA: Diagnosis present

## 2016-11-17 DIAGNOSIS — Z951 Presence of aortocoronary bypass graft: Secondary | ICD-10-CM | POA: Diagnosis not present

## 2016-11-17 DIAGNOSIS — I959 Hypotension, unspecified: Secondary | ICD-10-CM | POA: Diagnosis not present

## 2016-11-17 DIAGNOSIS — I700 Atherosclerosis of aorta: Secondary | ICD-10-CM | POA: Diagnosis present

## 2016-11-17 DIAGNOSIS — Z95828 Presence of other vascular implants and grafts: Secondary | ICD-10-CM

## 2016-11-17 DIAGNOSIS — Z1629 Resistance to other single specified antibiotic: Secondary | ICD-10-CM | POA: Diagnosis present

## 2016-11-17 DIAGNOSIS — R791 Abnormal coagulation profile: Secondary | ICD-10-CM | POA: Diagnosis present

## 2016-11-17 DIAGNOSIS — M069 Rheumatoid arthritis, unspecified: Secondary | ICD-10-CM | POA: Diagnosis present

## 2016-11-17 DIAGNOSIS — E039 Hypothyroidism, unspecified: Secondary | ICD-10-CM | POA: Diagnosis present

## 2016-11-17 DIAGNOSIS — I4891 Unspecified atrial fibrillation: Secondary | ICD-10-CM

## 2016-11-17 DIAGNOSIS — I130 Hypertensive heart and chronic kidney disease with heart failure and stage 1 through stage 4 chronic kidney disease, or unspecified chronic kidney disease: Secondary | ICD-10-CM | POA: Diagnosis present

## 2016-11-17 DIAGNOSIS — N390 Urinary tract infection, site not specified: Secondary | ICD-10-CM | POA: Diagnosis present

## 2016-11-17 DIAGNOSIS — Z86718 Personal history of other venous thrombosis and embolism: Secondary | ICD-10-CM

## 2016-11-17 DIAGNOSIS — Z7983 Long term (current) use of bisphosphonates: Secondary | ICD-10-CM

## 2016-11-17 DIAGNOSIS — Z96653 Presence of artificial knee joint, bilateral: Secondary | ICD-10-CM | POA: Diagnosis present

## 2016-11-17 DIAGNOSIS — E1151 Type 2 diabetes mellitus with diabetic peripheral angiopathy without gangrene: Secondary | ICD-10-CM | POA: Diagnosis present

## 2016-11-17 DIAGNOSIS — E1122 Type 2 diabetes mellitus with diabetic chronic kidney disease: Secondary | ICD-10-CM | POA: Diagnosis present

## 2016-11-17 DIAGNOSIS — I5033 Acute on chronic diastolic (congestive) heart failure: Secondary | ICD-10-CM

## 2016-11-17 DIAGNOSIS — D638 Anemia in other chronic diseases classified elsewhere: Secondary | ICD-10-CM | POA: Diagnosis present

## 2016-11-17 DIAGNOSIS — Z7982 Long term (current) use of aspirin: Secondary | ICD-10-CM

## 2016-11-17 DIAGNOSIS — I2510 Atherosclerotic heart disease of native coronary artery without angina pectoris: Secondary | ICD-10-CM | POA: Diagnosis present

## 2016-11-17 DIAGNOSIS — Z91013 Allergy to seafood: Secondary | ICD-10-CM

## 2016-11-17 DIAGNOSIS — I361 Nonrheumatic tricuspid (valve) insufficiency: Secondary | ICD-10-CM | POA: Diagnosis not present

## 2016-11-17 DIAGNOSIS — Z8249 Family history of ischemic heart disease and other diseases of the circulatory system: Secondary | ICD-10-CM | POA: Diagnosis not present

## 2016-11-17 DIAGNOSIS — I10 Essential (primary) hypertension: Secondary | ICD-10-CM | POA: Diagnosis present

## 2016-11-17 HISTORY — DX: Hyperlipidemia, unspecified: E78.5

## 2016-11-17 LAB — PROTIME-INR
INR: 1.11
PROTHROMBIN TIME: 14.3 s (ref 11.4–15.2)

## 2016-11-17 LAB — CBC WITH DIFFERENTIAL/PLATELET
BASOS ABS: 0 10*3/uL (ref 0.0–0.1)
Basophils Relative: 1 %
Eosinophils Absolute: 0 10*3/uL (ref 0.0–0.7)
Eosinophils Relative: 1 %
HCT: 37.4 % (ref 36.0–46.0)
HEMOGLOBIN: 11.8 g/dL — AB (ref 12.0–15.0)
LYMPHS ABS: 2.2 10*3/uL (ref 0.7–4.0)
LYMPHS PCT: 26 %
MCH: 27.9 pg (ref 26.0–34.0)
MCHC: 31.6 g/dL (ref 30.0–36.0)
MCV: 88.4 fL (ref 78.0–100.0)
Monocytes Absolute: 0.8 10*3/uL (ref 0.1–1.0)
Monocytes Relative: 9 %
NEUTROS PCT: 65 %
Neutro Abs: 5.6 10*3/uL (ref 1.7–7.7)
PLATELETS: 288 10*3/uL (ref 150–400)
RBC: 4.23 MIL/uL (ref 3.87–5.11)
RDW: 19.7 % — ABNORMAL HIGH (ref 11.5–15.5)
WBC: 8.7 10*3/uL (ref 4.0–10.5)

## 2016-11-17 LAB — COMPREHENSIVE METABOLIC PANEL
ALBUMIN: 3.2 g/dL — AB (ref 3.5–5.0)
ALT: 23 U/L (ref 14–54)
AST: 23 U/L (ref 15–41)
Alkaline Phosphatase: 100 U/L (ref 38–126)
Anion gap: 11 (ref 5–15)
BUN: 21 mg/dL — AB (ref 6–20)
CHLORIDE: 102 mmol/L (ref 101–111)
CO2: 26 mmol/L (ref 22–32)
CREATININE: 1.38 mg/dL — AB (ref 0.44–1.00)
Calcium: 9.3 mg/dL (ref 8.9–10.3)
GFR calc Af Amer: 39 mL/min — ABNORMAL LOW (ref >60)
GFR calc non Af Amer: 34 mL/min — ABNORMAL LOW (ref >60)
GLUCOSE: 180 mg/dL — AB (ref 65–99)
Potassium: 4.7 mmol/L (ref 3.5–5.1)
SODIUM: 139 mmol/L (ref 135–145)
Total Bilirubin: 0.9 mg/dL (ref 0.3–1.2)
Total Protein: 7.1 g/dL (ref 6.5–8.1)

## 2016-11-17 LAB — I-STAT TROPONIN, ED: Troponin i, poc: 0 ng/mL (ref 0.00–0.08)

## 2016-11-17 LAB — POCT INR: INR: 1.1

## 2016-11-17 LAB — URINALYSIS, ROUTINE W REFLEX MICROSCOPIC
Bilirubin Urine: NEGATIVE
GLUCOSE, UA: NEGATIVE mg/dL
Hgb urine dipstick: NEGATIVE
Ketones, ur: NEGATIVE mg/dL
LEUKOCYTES UA: NEGATIVE
NITRITE: NEGATIVE
PH: 8 (ref 5.0–8.0)
Protein, ur: NEGATIVE mg/dL
Specific Gravity, Urine: 1.004 — ABNORMAL LOW (ref 1.005–1.030)

## 2016-11-17 LAB — I-STAT CG4 LACTIC ACID, ED: Lactic Acid, Venous: 1.61 mmol/L (ref 0.5–1.9)

## 2016-11-17 LAB — BRAIN NATRIURETIC PEPTIDE: B Natriuretic Peptide: 174.2 pg/mL — ABNORMAL HIGH (ref 0.0–100.0)

## 2016-11-17 LAB — LIPASE, BLOOD: LIPASE: 41 U/L (ref 11–51)

## 2016-11-17 IMAGING — DX DG CHEST 2V
2 series · 2 of 2 positions shown · non-contrast
Comparison: [DATE]

CLINICAL DATA: Cough, palpitations, and weakness for 5 days.
Coronary artery disease.

EXAM:
CHEST  2 VIEW

[x chest ap]
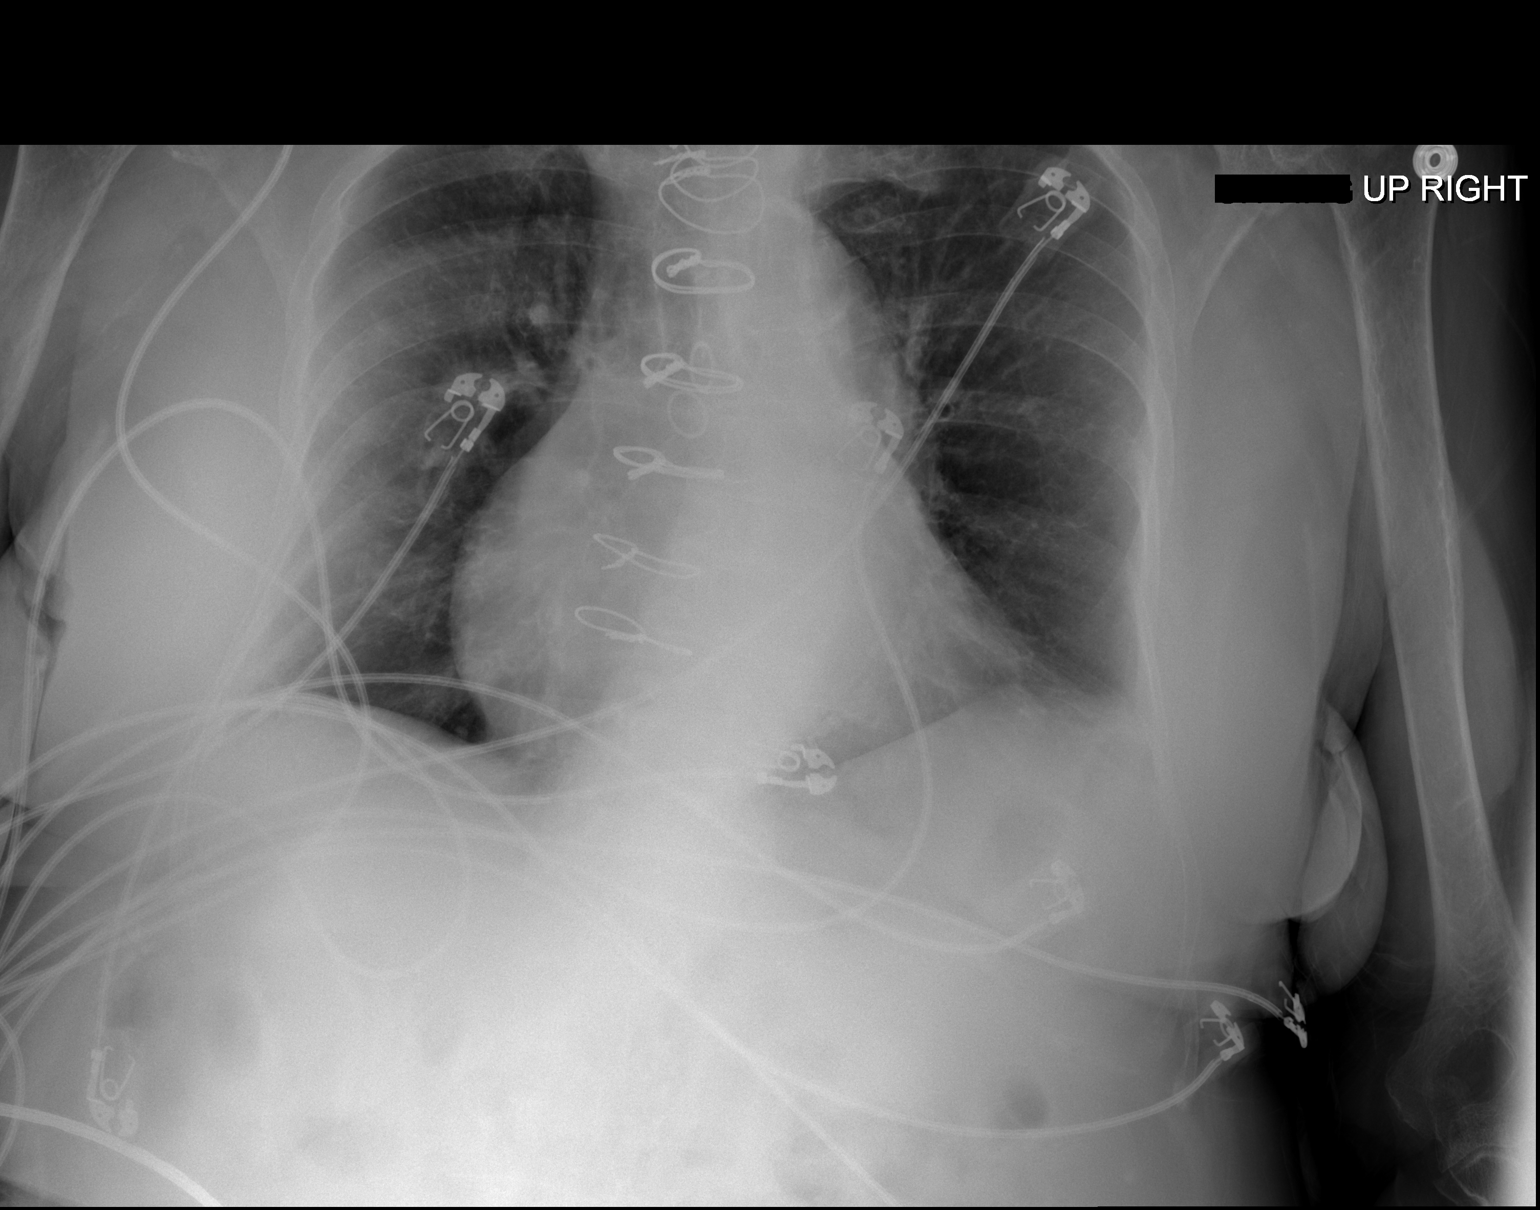

[w chest lat]
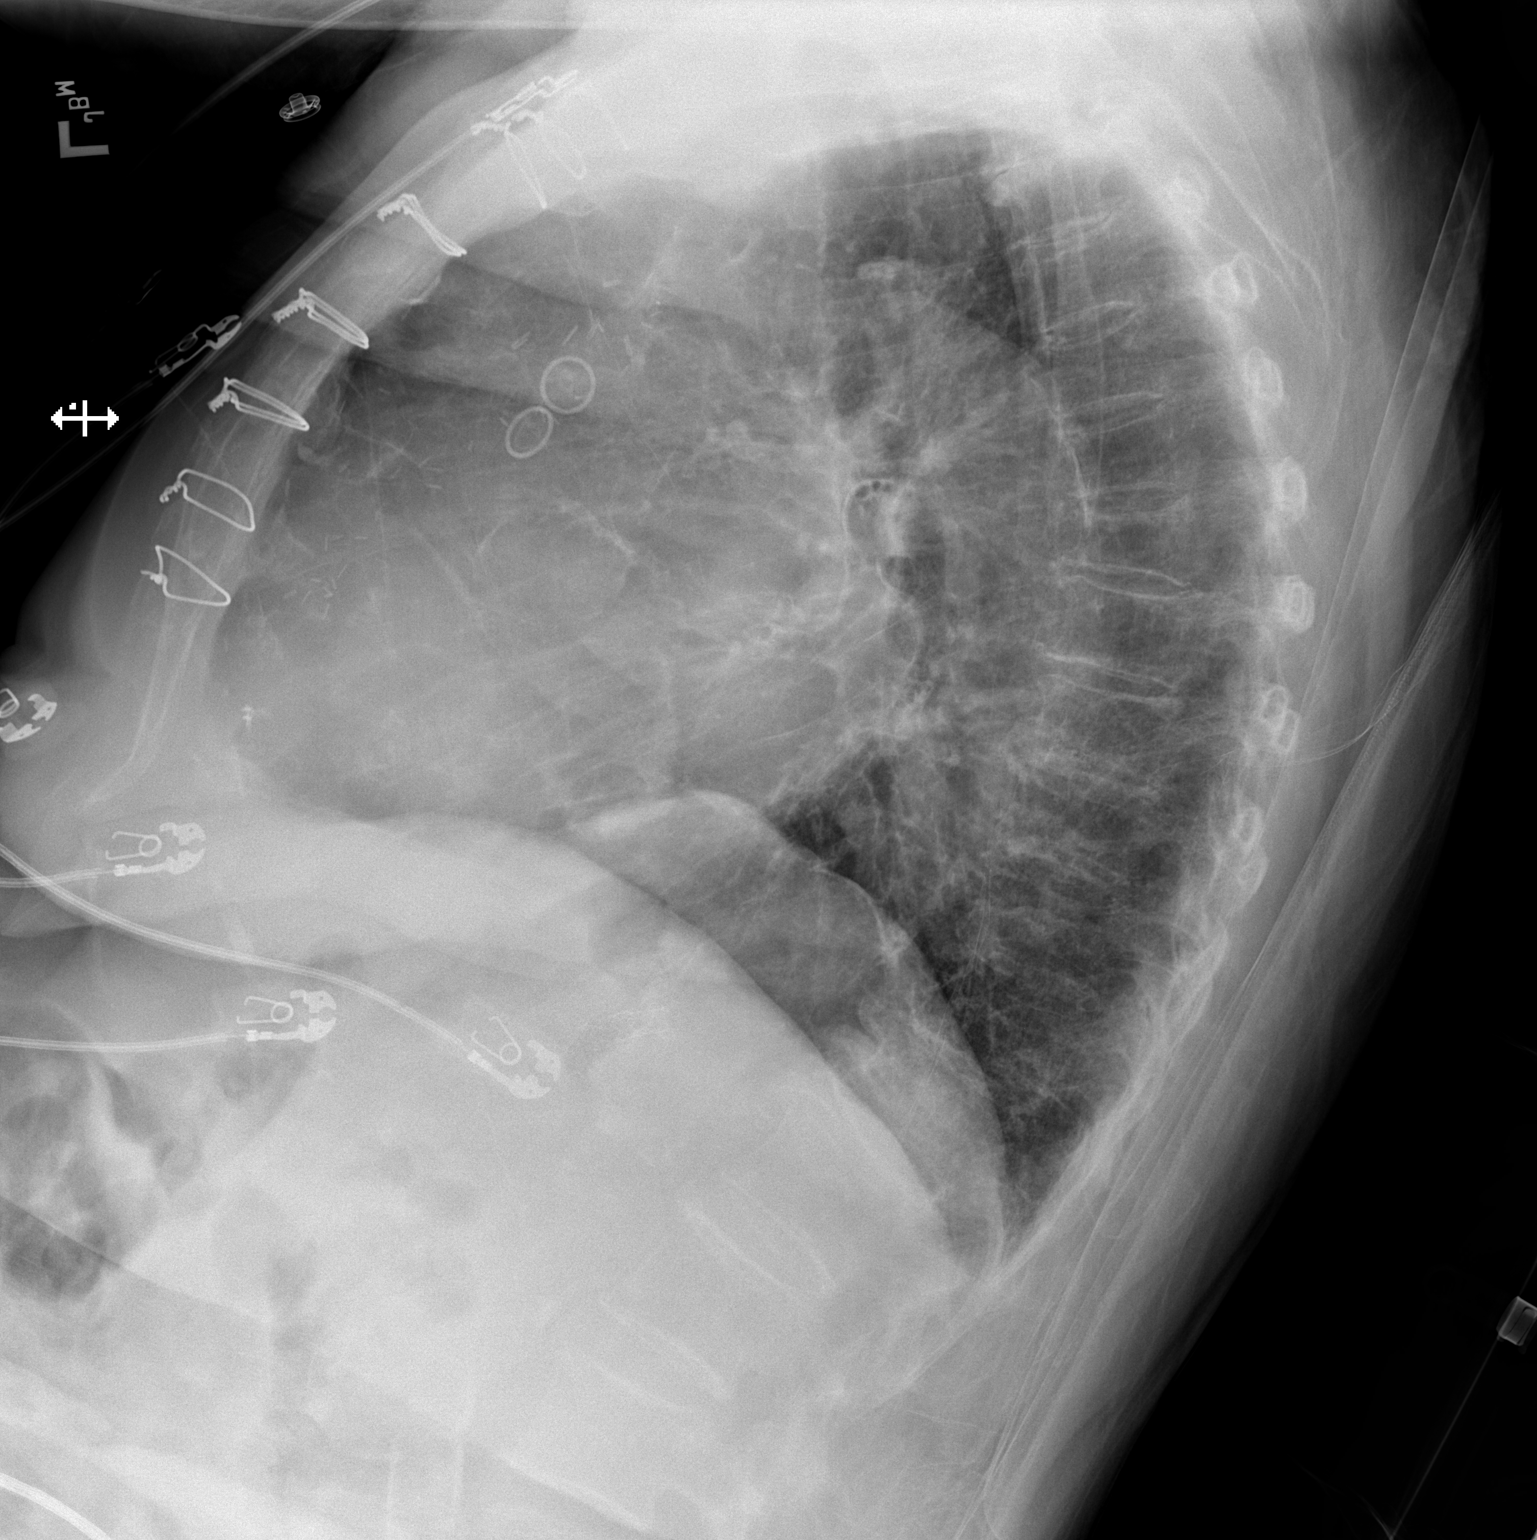

[2 of 2 positions shown; findings below may reference images not displayed]

FINDINGS: Stable mild cardiomegaly and prior CABG. Both lungs are clear. No
evidence of pneumothorax or pleural effusion. Aortic
atherosclerosis.
IMPRESSION: Stable mild cardiomegaly.  No active lung disease.

## 2016-11-17 MED ORDER — DILTIAZEM HCL 25 MG/5ML IV SOLN
10.0000 mg | Freq: Once | INTRAVENOUS | Status: AC
  Administered 2016-11-17: 10 mg via INTRAVENOUS
  Filled 2016-11-17: qty 5

## 2016-11-17 MED ORDER — DEXTROSE 5 % IV SOLN
5.0000 mg/h | Freq: Once | INTRAVENOUS | Status: AC
  Administered 2016-11-17: 5 mg/h via INTRAVENOUS
  Filled 2016-11-17: qty 100

## 2016-11-17 NOTE — ED Triage Notes (Signed)
Patient arrived to ED via [facility]. EMS reports:  Patient was transported from PCP at [facility] in [place]. Patient had been to Urgent Care 2 days ago for same complaint.  Patient found to be in A Fib w/RVR (rate 120s-130s). Patient had been newly diagnosed during admission to [facility] 3 months ago. Also, admitted at that time with CHF, and UTI.  Patient feels "cruddy" and weak. Denies chest pain.  Dyspnea with exertion.  Atrial fib on 12 lead.  BP 144/80, Pulse 124, Resp 16, 97% on room air - 100% on 2 LPM via nasal cannula.  CBG 217. Hx - AF w/RVR, CHF, Diet controlled DM.

## 2016-11-17 NOTE — ED Notes (Signed)
Patient transported to X-ray 

## 2016-11-17 NOTE — ED Provider Notes (Signed)
[facility]-EMERGENCY DEPT Provider Note   CSN: [id number] Arrival date & time: 11/17/16  1651     History   Chief Complaint Chief Complaint  Patient presents with  . Weakness  . Atrial Fibrillation w/ RVR    HPI [name] is a 83 y.o. female hx of pancreatitis, CAD, CHF, diabetes, A. fib on Coumadin here presenting with weight gain, rapid A. fib, weakness, palpitations. Patient was admitted 3 weeks ago for CHF exacerbation and UTI. Patient was put on Lasix and finished a course of Keflex. Patient states that she has been feeling weak and dizzy for the last several days and has some shortness of breath with exertion. Over the last 2 days she gave about 8 pounds as well. She went to urgent care 2 days ago and was diagnosed with urinary tract infection and put on Macrobid. She saw her doctor today since she still does not feel well and was noted to be rapid A. fib with heart rate of 120s.   The history is provided by the patient.    Past Medical History:  Diagnosis Date  . Acute pancreatitis   . Anemia   . Arthritis   . Atrial fibrillation (HCC)   . CAD (coronary artery disease)   . Carotid artery occlusion   . CHF (congestive heart failure) (HCC)   . Diabetes mellitus age 60  . DJD (degenerative joint disease)   . DVT (deep venous thrombosis) (HCC)   . GERD (gastroesophageal reflux disease)   . GI bleed   . Hypertension   . Joint pain   . Mesenteric ischemia   . Peripheral vascular disease (HCC)   . Thyroid disease     Patient Active Problem List   Diagnosis Date Noted  . Aortic atherosclerosis (HCC) 11/03/2016  . Acute on chronic diastolic CHF (congestive heart failure) (HCC) 10/23/2016  . Atrial fibrillation with RVR (HCC) 10/20/2016  . Grief at loss of child 10/21/2015  . Chronic kidney disease, stage IV (severe) (HCC) 04/14/2015  . Colonic constipation 04/14/2015  . Diabetes mellitus with renal complications (HCC) 03/03/2015  . Atrophic vaginitis 02/24/2015  .  Rheumatoid arthritis (HCC) 02/18/2015  . DNR (do not resuscitate) 02/13/2015  . Occlusion and stenosis of carotid artery without mention of cerebral infarction 05/02/2013  . 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 FEMORAL BRUIT 11/24/2009  . Hypothyroidism 06/26/2009  . CEREBROVASCULAR DISEASE 06/25/2009  . ATRIAL FIBRILLATION 05/19/2008  . CHRONIC DIASTOLIC HEART FAILURE 05/19/2008  . RENAL ARTERY STENOSIS 05/19/2008  . PERIPHERAL VASCULAR DISEASE 05/19/2008    Past Surgical History:  Procedure Laterality Date  . APPENDECTOMY    . CELIAC ARTERY STENT    . CORONARY ARTERY BYPASS GRAFT    . EMBOLECTOMY  2009   right leg  . FASCIOTOMY     right leg  . HIP FRACTURE SURGERY Left   . JOINT REPLACEMENT    . TOTAL KNEE ARTHROPLASTY     bilateral  . TUBAL LIGATION    . VISCERAL ANGIOGRAM N/A 05/31/2013   Procedure: MESSENTERIC [name];  Surgeon: [name], MD;  Location: [facility] CATH LAB;  Service: Cardiovascular;  Laterality: N/A;    OB History    No data available       Home Medications    Prior to Admission medications   Medication Sig Start Date End Date Taking? Authorizing Provider  alendronate (FOSAMAX) 70 MG tablet Take 70 mg by mouth every Sunday.    Yes [provider]  aspirin 81 MG EC tablet TAKE ONE TABLET BY MOUTH EVERY DAY Patient taking differently: TAKE 81 MG BY MOUTH EVERY DAY AT BEDTIME 09/11/15  Yes Rodolph Bong, MD  Calcium Carb-Cholecalciferol 600-800 MG-UNIT TABS TAKE ONE TABLET BY MOUTH 2 TIMES A DAY 09/11/15  Yes Rodolph Bong, MD  clonazePAM (KLONOPIN) 0.5 MG tablet TAKE 1 TABLET BY MOUTH AT BEDTIME Patient taking differently: TAKE 0.5 MG BY MOUTH AT BEDTIME 10/17/16  Yes Rodolph Bong, MD  diltiazem (CARDIZEM CD) 240 MG 24 hr capsule Take 1 capsule (240 mg total) by mouth at bedtime. 11/02/16  Yes Rodolph Bong, MD  Ergocalciferol (VITAMIN D2) 2000 units TABS Take 2,000 Units by mouth daily.    Yes [provider]  FERREX  150 150 MG capsule TAKE ONE CAPSULE BY MOUTH EVERY DAY Patient taking differently: TAKE 150 MG BY MOUTH EVERY DAY 09/11/15  Yes Rodolph Bong, MD  leflunomide (ARAVA) 10 MG tablet TAKE 1 TABLET BY MOUTH AT BEDTIME Patient taking differently: TAKE 10 MG BY MOUTH AT BEDTIME 10/03/16  Yes Rodolph Bong, MD  levothyroxine (SYNTHROID, LEVOTHROID) 50 MCG tablet Take 50 mcg by mouth daily.     Yes [provider]  lisinopril (PRINIVIL,ZESTRIL) 20 MG tablet TAKE 1 TABLET BY MOUTH ONCE DAILY Patient taking differently: TAKE 20 MG BY MOUTH ONCE DAILY 03/02/16  Yes Rodolph Bong, MD  magnesium oxide (MAG-OX) 400 (241.3 Mg) MG tablet Take 1 tablet (400 mg total) by mouth 2 (two) times daily. 10/24/16  Yes Meredeth Ide, MD  metoprolol succinate (TOPROL-XL) 25 MG 24 hr tablet TAKE 1 TABLET BY MOUTH ONCE DAILY Patient taking differently: TAKE 25 MG BY MOUTH ONCE DAILY 04/18/16  Yes Rodolph Bong, MD  Multiple Vitamin (MULTIVITAMIN WITH MINERALS) TABS tablet Take 1 tablet by mouth daily.   Yes [provider]  nitrofurantoin, macrocrystal-monohydrate, (MACROBID) 100 MG capsule Take 1 capsule (100 mg total) by mouth 2 (two) times daily. X 5 days 11/15/16  Yes Phelps, Erin O, PA-C  pantoprazole (PROTONIX) 40 MG tablet TAKE 1 TABLET BY MOUTH ONCE DAILY Patient taking differently: TAKE 40 MG BY MOUTH ONCE DAILY 03/02/16  Yes Rodolph Bong, MD  pravastatin (PRAVACHOL) 40 MG tablet TAKE 1 TABLET BY MOUTH ONCE DAILY Patient taking differently: TAKE 40 MG BY MOUTH ONCE DAILY 06/23/16  Yes Rodolph Bong, MD  predniSONE (DELTASONE) 5 MG tablet Take 1 or 2 tablets by mouth two times a day as needed for rheumatoid flair  Due for follow up visit 10/31/16  Yes Rodolph Bong, MD  STOOL SOFTENER 100 MG capsule TAKE 1 CAPSULE BY MOUTH 2 TIMES A DAY (AM & PM) Patient taking differently: TAKE 100 MG BY MOUTH 2 TIMES A DAY (AM & PM) 09/11/15  Yes Rodolph Bong, MD  torsemide (DEMADEX) 100 MG tablet TAKE 1/2 TABLET BY MOUTH  ONCE DAILY Patient taking differently: TAKE 50 MG BY MOUTH ONCE DAILY 10/14/16  Yes Rodolph Bong, MD  traMADol (ULTRAM) 50 MG tablet TAKE 1 TABLET BY MOUTH TWICE A DAY TAKE 1 TABLET BY MOUTH TWICE A DAYAS NEEDED FOR PAIN Patient taking differently: Take 50 mg by mouth twice daily as needed for pain 08/30/16  Yes Rodolph Bong, MD  vitamin B-12 (CYANOCOBALAMIN) 1000 MCG tablet Take 1,000 mcg by mouth daily.   Yes [provider]  warfarin (COUMADIN) 3 MG tablet Take every other day for 2 weeks then return to clinic for  recheck. 11/02/16  Yes Rodolph Bong, MD  glucose blood test strip Once daily E11.29 Patient not taking: Reported on 11/17/2016 03/03/15   Rodolph Bong, MD    Family History Family History  Problem Relation Age of Onset  . Cancer Mother        ovarian  . Other Father        suicide  . Coronary artery disease Sister   . Other Sister        alzheimers  . Heart disease Sister        Before age 50  . Diabetes Sister   . Cancer Daughter        spinal tumor  . Diabetes Daughter   . Hypertension Daughter   . Diabetes Son   . Hyperlipidemia Son   . Hypertension Son     Social History Social History  Substance Use Topics  . Smoking status: Never Smoker  . Smokeless tobacco: Never Used  . Alcohol use No     Allergies   Cefuroxime and Cephalexin   Review of Systems Review of Systems  Respiratory: Positive for shortness of breath.   Cardiovascular: Positive for palpitations.  All other systems reviewed and are negative.    Physical Exam Updated Vital Signs BP 127/86   Pulse (!) 103   Temp 98.5 F (36.9 C)   Resp 14   Ht 5\' 2"  (1.575 m)   Wt 70.3 kg (155 lb)   SpO2 97%   BMI 28.35 kg/m   Physical Exam  Constitutional: She is oriented to person, place, and time.  Uncomfortable   HENT:  Head: Normocephalic.  Eyes: Pupils are equal, round, and reactive to light. Conjunctivae and EOM are normal.  Neck: Normal range of motion. Neck supple.    Cardiovascular:  Tachycardic   Pulmonary/Chest: Effort normal and breath sounds normal. No respiratory distress. She has no wheezes.  Abdominal: Soft. Bowel sounds are normal. She exhibits no distension. There is no tenderness.  Musculoskeletal:  1+ edema bilaterally   Neurological: She is alert and oriented to person, place, and time. No cranial nerve deficit. Coordination normal.  Skin: Skin is warm.  Psychiatric: She has a normal mood and affect.  Nursing note and vitals reviewed.    ED Treatments / Results  Labs (all labs ordered are listed, but only abnormal results are displayed) Labs Reviewed  CBC WITH DIFFERENTIAL/PLATELET - Abnormal; Notable for the following:       Result Value   Hemoglobin 11.8 (*)    RDW 19.7 (*)    All other components within normal limits  COMPREHENSIVE METABOLIC PANEL - Abnormal; Notable for the following:    Glucose, Bld 180 (*)    BUN 21 (*)    Creatinine, Ser 1.38 (*)    Albumin 3.2 (*)    GFR calc non Af Amer 34 (*)    GFR calc Af Amer 39 (*)    All other components within normal limits  URINALYSIS, ROUTINE W REFLEX MICROSCOPIC - Abnormal; Notable for the following:    Specific Gravity, Urine 1.004 (*)    All other components within normal limits  BRAIN NATRIURETIC PEPTIDE - Abnormal; Notable for the following:    B Natriuretic Peptide 174.2 (*)    All other components within normal limits  PROTIME-INR  LIPASE, BLOOD  I-STAT TROPONIN, ED  I-STAT CG4 LACTIC ACID, ED  I-STAT TROPONIN, ED    EKG  EKG Interpretation  Date/Time:  Thursday November 17 2016 17:24:17  EDT Ventricular Rate:  101 PR Interval:    QRS Duration: 116 QT Interval:  345 QTC Calculation: 448 R Axis:   51 Text Interpretation:  Atrial fibrillation Ventricular premature complex LVH with secondary repolarization abnormality rate similar to previous  Confirmed by Richardean Canal [phone]) on 11/17/2016 5:27:17 PM       Radiology Dg Chest 2 View  Result Date:  11/17/2016 CLINICAL DATA:  Cough, palpitations, and weakness for 5 days. Coronary artery disease. EXAM: CHEST  2 VIEW COMPARISON:  11/02/2016 FINDINGS: Stable mild cardiomegaly and prior CABG. Both lungs are clear. No evidence of pneumothorax or pleural effusion. Aortic atherosclerosis. IMPRESSION: Stable mild cardiomegaly.  No active lung disease. Electronically Signed   By: Myles Rosenthal M.D.   On: 11/17/2016 18:47    Procedures Procedures (including critical care time)  CRITICAL CARE Performed by: Richardean Canal   Total critical care time: 30 minutes  Critical care time was exclusive of separately billable procedures and treating other patients.  Critical care was necessary to treat or prevent imminent or life-threatening deterioration.  Critical care was time spent personally by me on the following activities: development of treatment plan with patient and/or surrogate as well as nursing, discussions with consultants, evaluation of patient's response to treatment, examination of patient, obtaining history from patient or surrogate, ordering and performing treatments and interventions, ordering and review of laboratory studies, ordering and review of radiographic studies, pulse oximetry and re-evaluation of patient's condition.   Medications Ordered in ED Medications  diltiazem (CARDIZEM) 100 mg in dextrose 5 % 100 mL (1 mg/mL) infusion (not administered)  diltiazem (CARDIZEM) injection 10 mg (10 mg Intravenous Given 11/17/16 1928)     Initial Impression / Assessment and Plan / ED Course  I have reviewed the triage vital signs and the nursing notes.  Pertinent labs & imaging results that were available during my care of the patient were reviewed by me and considered in my medical decision making (see chart for details).     DARRIEL SCHALL is a 83 y.o. female here with rapid afib, possible CHF exacerbation. HR 120-130s in the ED. Rapid afib on arrival. Will check labs, BNP, CXR. Repeat  UA. Will give cardizem.   8:22 PM HR still 120s after cardizem bolus. Labs and BNP and UA unremarkable. Started on cardizem drip. Consulted cardiology to follow. Medicine to admit.    Final Clinical Impressions(s) / ED Diagnoses   Final diagnoses:  None    New Prescriptions New Prescriptions   No medications on file     Charlynne Pander, MD 11/17/16 2022

## 2016-11-17 NOTE — H&P (Signed)
History and Physical    CARALEE MOREA OVZ:[id number] DOB: [birth year] DOA: 11/17/2016  PCP: Rodolph Bong, MD   Patient coming from: PCP.  I have personally briefly reviewed patient's old medical records in [facility]  Chief Complaint: Weakness atrial fibrillation.  HPI: SAOIRSE LEGERE is a 83 y.o. female with medical history significant of pancreatitis, chronic anemia, osteoarthritis, chronic atrial fibrillation, CAD, carotid artery disease, unspecified CHF, DVT, peripheral vascular disease, hypertension, GERD, history of GI bleed, history of mesenteric ischemia, hyperlipidemia, hypothyroidism who is coming to the emergency department referred by the [facility] clinic for weakness for the past 3 days and atrial fibrillation with RVR in the 120s and 130s. She is currently being treated for UTI with nitrofurantoin 100 mg by mouth twice a day after presenting to the [facility] [place] with weakness, confusion and dysuria 2 days ago. She complains of mild dyspnea, palpitations and orthopnea. She denies fever, chills, sore throat, chest pain, diaphoresis, PND, abdominal pain, nausea, emesis, diarrhea, melena, hematochezia, current dysuria, polyuria, polydipsia or blurred vision.  ED Course: Initial vital signs temperature 98.64F, pulse 121, blood pressure 155/101 mmHg, respirations 13 and O2 sat 99%. The patient was started on a Cardizem infusion. Her workup shows normal urinalysis. Her EKG shows atrial fibrillation with RVR, the PVCs and LVH. Please see tracing for further detail. Troponin level was normal. Her BNP was 174.2 pg/mL. PT was 14.3 and INR 1.11. WBC 8.7, hemoglobin 11.8 g/dL and platelets 412. Lactic acid and electrolytes were normal. Lipase level was normal. Her chest radiograph shows stable cardiomegaly.  Review of Systems: As per HPI otherwise 10 point review of systems negative.    Past Medical History:  Diagnosis Date  . Acute pancreatitis   . Anemia   . Arthritis   .  Atrial fibrillation (HCC)   . CAD (coronary artery disease)   . Carotid artery occlusion   . CHF (congestive heart failure) (HCC)   . Diabetes mellitus age 89  . DJD (degenerative joint disease)   . DVT (deep venous thrombosis) (HCC)   . GERD (gastroesophageal reflux disease)   . GI bleed   . Hyperlipidemia   . Hypertension   . Joint pain   . Mesenteric ischemia   . Peripheral vascular disease (HCC)   . Thyroid disease     Past Surgical History:  Procedure Laterality Date  . APPENDECTOMY    . CELIAC ARTERY STENT    . CORONARY ARTERY BYPASS GRAFT    . EMBOLECTOMY  2009   right leg  . FASCIOTOMY     right leg  . HIP FRACTURE SURGERY Left   . JOINT REPLACEMENT    . TOTAL KNEE ARTHROPLASTY     bilateral  . TUBAL LIGATION    . VISCERAL ANGIOGRAM N/A 05/31/2013   Procedure: MESSENTERIC [name];  Surgeon: [name], MD;  Location: [facility] CATH LAB;  Service: Cardiovascular;  Laterality: N/A;     reports that she has never smoked. She has never used smokeless tobacco. She reports that she does not drink alcohol or use drugs.  Allergies  Allergen Reactions  . Cefuroxime Anaphylaxis and Other (See Comments)    Throat swelling  . Cephalexin Rash    Family History  Problem Relation Age of Onset  . Cancer Mother        ovarian  . Other Father        suicide  . Coronary artery disease Sister   . Other Sister  alzheimers  . Heart disease Sister        Before age 79  . Diabetes Sister   . Cancer Daughter        spinal tumor  . Diabetes Daughter   . Hypertension Daughter   . Diabetes Son   . Hyperlipidemia Son   . Hypertension Son     Prior to Admission medications   Medication Sig Start Date End Date Taking? Authorizing Provider  alendronate (FOSAMAX) 70 MG tablet Take 70 mg by mouth every Sunday.    Yes [provider]  aspirin 81 MG EC tablet TAKE ONE TABLET BY MOUTH EVERY DAY Patient taking differently: TAKE 81 MG BY MOUTH EVERY DAY AT BEDTIME  09/11/15  Yes Rodolph Bong, MD  Calcium Carb-Cholecalciferol 600-800 MG-UNIT TABS TAKE ONE TABLET BY MOUTH 2 TIMES A DAY 09/11/15  Yes Rodolph Bong, MD  clonazePAM (KLONOPIN) 0.5 MG tablet TAKE 1 TABLET BY MOUTH AT BEDTIME Patient taking differently: TAKE 0.5 MG BY MOUTH AT BEDTIME 10/17/16  Yes Rodolph Bong, MD  diltiazem (CARDIZEM CD) 240 MG 24 hr capsule Take 1 capsule (240 mg total) by mouth at bedtime. 11/02/16  Yes Rodolph Bong, MD  Ergocalciferol (VITAMIN D2) 2000 units TABS Take 2,000 Units by mouth daily.    Yes [provider]  FERREX 150 150 MG capsule TAKE ONE CAPSULE BY MOUTH EVERY DAY Patient taking differently: TAKE 150 MG BY MOUTH EVERY DAY 09/11/15  Yes Rodolph Bong, MD  leflunomide (ARAVA) 10 MG tablet TAKE 1 TABLET BY MOUTH AT BEDTIME Patient taking differently: TAKE 10 MG BY MOUTH AT BEDTIME 10/03/16  Yes Rodolph Bong, MD  levothyroxine (SYNTHROID, LEVOTHROID) 50 MCG tablet Take 50 mcg by mouth daily.     Yes [provider]  lisinopril (PRINIVIL,ZESTRIL) 20 MG tablet TAKE 1 TABLET BY MOUTH ONCE DAILY Patient taking differently: TAKE 20 MG BY MOUTH ONCE DAILY 03/02/16  Yes Rodolph Bong, MD  magnesium oxide (MAG-OX) 400 (241.3 Mg) MG tablet Take 1 tablet (400 mg total) by mouth 2 (two) times daily. 10/24/16  Yes Meredeth Ide, MD  metoprolol succinate (TOPROL-XL) 25 MG 24 hr tablet TAKE 1 TABLET BY MOUTH ONCE DAILY Patient taking differently: TAKE 25 MG BY MOUTH ONCE DAILY 04/18/16  Yes Rodolph Bong, MD  Multiple Vitamin (MULTIVITAMIN WITH MINERALS) TABS tablet Take 1 tablet by mouth daily.   Yes [provider]  nitrofurantoin, macrocrystal-monohydrate, (MACROBID) 100 MG capsule Take 1 capsule (100 mg total) by mouth 2 (two) times daily. X 5 days 11/15/16  Yes Phelps, Erin O, PA-C  pantoprazole (PROTONIX) 40 MG tablet TAKE 1 TABLET BY MOUTH ONCE DAILY Patient taking differently: TAKE 40 MG BY MOUTH ONCE DAILY 03/02/16  Yes Rodolph Bong, MD  pravastatin  (PRAVACHOL) 40 MG tablet TAKE 1 TABLET BY MOUTH ONCE DAILY Patient taking differently: TAKE 40 MG BY MOUTH ONCE DAILY 06/23/16  Yes Rodolph Bong, MD  predniSONE (DELTASONE) 5 MG tablet Take 1 or 2 tablets by mouth two times a day as needed for rheumatoid flair  Due for follow up visit 10/31/16  Yes Rodolph Bong, MD  STOOL SOFTENER 100 MG capsule TAKE 1 CAPSULE BY MOUTH 2 TIMES A DAY (AM & PM) Patient taking differently: TAKE 100 MG BY MOUTH 2 TIMES A DAY (AM & PM) 09/11/15  Yes Rodolph Bong, MD  torsemide (DEMADEX) 100 MG tablet TAKE 1/2 TABLET BY MOUTH ONCE DAILY Patient taking  differently: TAKE 50 MG BY MOUTH ONCE DAILY 10/14/16  Yes Rodolph Bong, MD  traMADol (ULTRAM) 50 MG tablet TAKE 1 TABLET BY MOUTH TWICE A DAY TAKE 1 TABLET BY MOUTH TWICE A DAYAS NEEDED FOR PAIN Patient taking differently: Take 50 mg by mouth twice daily as needed for pain 08/30/16  Yes Rodolph Bong, MD  vitamin B-12 (CYANOCOBALAMIN) 1000 MCG tablet Take 1,000 mcg by mouth daily.   Yes [provider]  warfarin (COUMADIN) 3 MG tablet Take every other day for 2 weeks then return to clinic for recheck. 11/02/16  Yes Rodolph Bong, MD  glucose blood test strip Once daily E11.29 Patient not taking: Reported on 11/17/2016 03/03/15   Rodolph Bong, MD    Physical Exam: Vitals:   11/17/16 2045 11/17/16 2110 11/17/16 2115 11/17/16 2200  BP: 123/72 (!) 130/101 114/65 109/73  Pulse: (!) 108 84 100 (!) 110  Resp: 18 13 17  (!) 21  Temp:      SpO2: 96% 96% 96% 94%  Weight:      Height:        Constitutional: NAD, calm, comfortable Eyes: PERRL, lids and conjunctivae normal ENMT: Mucous membranes are moist. Posterior pharynx clear of any exudate or lesions. Neck: normal, supple, no masses, no thyromegaly Respiratory: Decreased breath sounds on bases, otherwise clear to auscultation bilaterally, no wheezing, no crackles. Normal respiratory effort. No accessory muscle use.  Cardiovascular: Irregularly irregular with a  heart rate ranging from low 80s to mid 90s, no murmurs / rubs / gallops. 1+ lower extremity edema. 2+ pedal pulses. No carotid bruits.  Abdomen: Soft, no tenderness, no masses palpated. No hepatosplenomegaly. Bowel sounds positive.  Musculoskeletal: no clubbing / cyanosis. Boutonniere deformity of thumbs. Multiple MCP with ulnar deviation and swan-neck deformities of fingers. Good ROM, no contractures. Normal muscle tone.  Skin: no rashes, lesions, ulcers on limited skin exam Neurologic: CN 2-12 grossly intact. Sensation intact, DTR normal. Strength 5/5 in all 4.  Psychiatric: Normal judgment and insight. Alert and oriented x 4. Mildly anxious mood.   Labs on Admission: I have personally reviewed following labs and imaging studies  CBC:  Recent Labs Lab 11/17/16 1753  WBC 8.7  NEUTROABS 5.6  HGB 11.8*  HCT 37.4  MCV 88.4  PLT 288   Basic Metabolic Panel:  Recent Labs Lab 11/17/16 1753  NA 139  K 4.7  CL 102  CO2 26  GLUCOSE 180*  BUN 21*  CREATININE 1.38*  CALCIUM 9.3   GFR: Estimated Creatinine Clearance: 27.9 mL/min (A) (by C-G formula based on SCr of 1.38 mg/dL (H)). Liver Function Tests:  Recent Labs Lab 11/17/16 1753  AST 23  ALT 23  ALKPHOS 100  BILITOT 0.9  PROT 7.1  ALBUMIN 3.2*    Recent Labs Lab 11/17/16 1753  LIPASE 41   No results for input(s): AMMONIA in the last 168 hours. Coagulation Profile:  Recent Labs Lab 11/17/16 1541 11/17/16 1753  INR 1.1 1.11   Cardiac Enzymes: No results for input(s): CKTOTAL, CKMB, CKMBINDEX, TROPONINI in the last 168 hours. BNP (last 3 results) No results for input(s): PROBNP in the last 8760 hours. HbA1C: No results for input(s): HGBA1C in the last 72 hours. CBG: No results for input(s): GLUCAP in the last 168 hours. Lipid Profile: No results for input(s): CHOL, HDL, LDLCALC, TRIG, CHOLHDL, LDLDIRECT in the last 72 hours. Thyroid Function Tests: No results for input(s): TSH, T4TOTAL, FREET4, T3FREE,  THYROIDAB in the last 72 hours.  Anemia Panel: No results for input(s): VITAMINB12, FOLATE, FERRITIN, TIBC, IRON, RETICCTPCT in the last 72 hours. Urine analysis:    Component Value Date/Time   COLORURINE YELLOW 11/17/2016 1807   APPEARANCEUR CLEAR 11/17/2016 1807   LABSPEC 1.004 (L) 11/17/2016 1807   PHURINE 8.0 11/17/2016 1807   GLUCOSEU NEGATIVE 11/17/2016 1807   GLUCOSEU NEG mg/dL 37/12/6436 3818   HGBUR NEGATIVE 11/17/2016 1807   BILIRUBINUR NEGATIVE 11/17/2016 1807   BILIRUBINUR negative 11/15/2016 1458   BILIRUBINUR neg 03/29/2016 1447   KETONESUR NEGATIVE 11/17/2016 1807   PROTEINUR NEGATIVE 11/17/2016 1807   UROBILINOGEN 0.2 11/15/2016 1458   UROBILINOGEN 0.2 03/07/2010 1517   NITRITE NEGATIVE 11/17/2016 1807   LEUKOCYTESUR NEGATIVE 11/17/2016 1807    Radiological Exams on Admission: Dg Chest 2 View  Result Date: 11/17/2016 CLINICAL DATA:  Cough, palpitations, and weakness for 5 days. Coronary artery disease. EXAM: CHEST  2 VIEW COMPARISON:  11/02/2016 FINDINGS: Stable mild cardiomegaly and prior CABG. Both lungs are clear. No evidence of pneumothorax or pleural effusion. Aortic atherosclerosis. IMPRESSION: Stable mild cardiomegaly.  No active lung disease. Electronically Signed   By: Myles Rosenthal M.D.   On: 11/17/2016 18:47    EKG: Independently reviewed. Vent. rate 101 BPM PR interval * ms QRS duration 116 ms QT/QTc 345/448 ms P-R-T axes * 51 50 Atrial fibrillation Ventricular premature complex LVH with secondary repolarization abnormality  Assessment/Plan Principal Problem:   Atrial fibrillation with RVR (HCC) CHA?DS?-VASc Score of at least 6. Admit to stepdown/inpatient. Continue Cardizem infusion. Trend troponin levels. Check TSH. Optimize electrolytes. Continue metoprolol succinate 25 mg by mouth daily. Lovenox SQ since INR is not therapeutic. Warfarin per pharmacy. Check echocardiogram in a.m.  Active Problems:   Hypothyroidism Check TSH  level. Continue levothyroxine    Rheumatoid arthritis (HCC) Continue Arava 10 mg by mouth daily. Due prednisone 5 mg by mouth daily.    Diabetes mellitus with renal complications (HCC) Carbohydrate modified diet. CBG monitoring before meals with regular insulin sliding scale. EEG monitoring at bedtime.    Anemia Hemoglobin 4 weeks ago was 11.9, and then 10.9 on 10/22/2016. Iron studies in July last year were normal. Continue iron supplementation. Monitor hematocrit and hemoglobin.    CAD (coronary artery disease) Denies chest pain at this time. Continue aspirin, beta blocker and statin.    Hyperlipidemia Continue pravastatin 40 mg by mouth daily. Monitor LFTs as needed. Fasting lipid profile follow-up as an outpatient.    Hypertension Continue lisinopril 20 mg by mouth daily. Continue metoprolol XL 25 mg by mouth daily. Monitor renal function, electrolytes and blood pressure. Resume oral Cardizem once off the infusion.    UTI (urinary tract infection) Continue Macrobid 100 mg by mouth daily.   DVT prophylaxis: On warfarin. Code Status: ode. Family Communication: *) Disposition Plan: Admit for rate control with Cardizem infusion, troponin levels trending. and cardiac monitoring. Consults called:  Admission status: Inpatient/stepdown.   Bobette Mo MD [facility] Pager [phone].  If 7PM-7AM, please contact night-coverage [email or web address] Password [email or web address]  11/17/2016, 11:10 PM

## 2016-11-17 NOTE — Progress Notes (Addendum)
[name] is a 83 y.o. female who presents to [name]: Primary Care Sports Medicine today for fatigue, SOB.   [name] was discharged from [facility] about 1 month ago after having AFib with RVR. She did well with normal rate control until recently. Over the past 2 days [name] notes worsening shortness of breath and fatigue. She was seen in urgent care on the seventh where she was thought to perhaps have a urinary tract infection and given nitrofurantoin. Her symptoms have continued. She notes significant exertional dyspnea. She denies severe chest pain or vomiting. She feels as though her heart is racing.   Past Medical History:  Diagnosis Date  . Acute pancreatitis   . Anemia   . Arthritis   . Atrial fibrillation (HCC)   . CAD (coronary artery disease)   . Carotid artery occlusion   . CHF (congestive heart failure) (HCC)   . Diabetes mellitus age 64  . DJD (degenerative joint disease)   . DVT (deep venous thrombosis) (HCC)   . GERD (gastroesophageal reflux disease)   . GI bleed   . Hypertension   . Joint pain   . Mesenteric ischemia   . Peripheral vascular disease (HCC)   . Thyroid disease    Past Surgical History:  Procedure Laterality Date  . APPENDECTOMY    . CELIAC ARTERY STENT    . CORONARY ARTERY BYPASS GRAFT    . EMBOLECTOMY  2009   right leg  . FASCIOTOMY     right leg  . HIP FRACTURE SURGERY Left   . JOINT REPLACEMENT    . TOTAL KNEE ARTHROPLASTY     bilateral  . TUBAL LIGATION    . VISCERAL ANGIOGRAM N/A 05/31/2013   Procedure: MESSENTERIC [name];  Surgeon: [name], MD;  Location: [facility] CATH LAB;  Service: Cardiovascular;  Laterality: N/A;   Social History  Substance Use Topics  . Smoking status: Never Smoker  . Smokeless tobacco: Never Used  . Alcohol use No   family history includes Cancer in her daughter and mother; Coronary artery  disease in her sister; Diabetes in her daughter, sister, and son; Heart disease in her sister; Hyperlipidemia in her son; Hypertension in her daughter and son; Other in her father and sister.  ROS as above:  Medications: Current Outpatient Prescriptions  Medication Sig Dispense Refill  . alendronate (FOSAMAX) 70 MG tablet Take 70 mg by mouth every Sunday.     Marland Kitchen aspirin 81 MG EC tablet TAKE ONE TABLET BY MOUTH EVERY DAY (Patient taking differently: TAKE 81 MG BY MOUTH EVERY DAY AT BEDTIME) 28 tablet 2  . Calcium Carb-Cholecalciferol 600-800 MG-UNIT TABS TAKE ONE TABLET BY MOUTH 2 TIMES A DAY 60 tablet 6  . clonazePAM (KLONOPIN) 0.5 MG tablet TAKE 1 TABLET BY MOUTH AT BEDTIME (Patient taking differently: TAKE 0.5 MG BY MOUTH AT BEDTIME) 14 tablet 0  . diltiazem (CARDIZEM CD) 240 MG 24 hr capsule Take 1 capsule (240 mg total) by mouth at bedtime. 90 capsule 1  . Ergocalciferol (VITAMIN D2) 2000 units TABS Take 2,000 Units by mouth daily.     Marland Kitchen FERREX 150 150 MG capsule TAKE ONE CAPSULE BY MOUTH EVERY DAY (Patient taking differently: TAKE 150 MG BY MOUTH EVERY DAY) 28 capsule 2  . glucose blood test strip Once daily E11.29 100 each 12  . ketoconazole (NIZORAL) 2 % cream Apply topically.    Marland Kitchen leflunomide (ARAVA) 10  MG tablet TAKE 1 TABLET BY MOUTH AT BEDTIME (Patient taking differently: TAKE 10 MG BY MOUTH AT BEDTIME) 14 tablet 4  . levothyroxine (SYNTHROID, LEVOTHROID) 50 MCG tablet Take 50 mcg by mouth daily.      Marland Kitchen lisinopril (PRINIVIL,ZESTRIL) 20 MG tablet TAKE 1 TABLET BY MOUTH ONCE DAILY (Patient taking differently: TAKE 20 MG BY MOUTH ONCE DAILY) 28 tablet 10  . magnesium oxide (MAG-OX) 400 (241.3 Mg) MG tablet Take 1 tablet (400 mg total) by mouth 2 (two) times daily. 60 tablet 2  . metoprolol succinate (TOPROL-XL) 25 MG 24 hr tablet TAKE 1 TABLET BY MOUTH ONCE DAILY (Patient taking differently: TAKE 25 MG BY MOUTH ONCE DAILY) 14 tablet 23  . Multiple Vitamin (MULTIVITAMIN WITH MINERALS)  TABS tablet Take 1 tablet by mouth daily.    . nitrofurantoin, macrocrystal-monohydrate, (MACROBID) 100 MG capsule Take 1 capsule (100 mg total) by mouth 2 (two) times daily. X 5 days 10 capsule 0  . pantoprazole (PROTONIX) 40 MG tablet TAKE 1 TABLET BY MOUTH ONCE DAILY (Patient taking differently: TAKE 40 MG BY MOUTH ONCE DAILY) 28 tablet 10  . pravastatin (PRAVACHOL) 40 MG tablet TAKE 1 TABLET BY MOUTH ONCE DAILY (Patient taking differently: TAKE 40 MG BY MOUTH ONCE DAILY) 14 tablet 23  . predniSONE (DELTASONE) 5 MG tablet Take 1 or 2 tablets by mouth two times a day as needed for rheumatoid flair  Due for follow up visit 30 tablet 0  . STOOL SOFTENER 100 MG capsule TAKE 1 CAPSULE BY MOUTH 2 TIMES A DAY (AM & PM) (Patient taking differently: TAKE 100 MG BY MOUTH 2 TIMES A DAY (AM & PM)) 56 capsule 0  . torsemide (DEMADEX) 100 MG tablet TAKE 1/2 TABLET BY MOUTH ONCE DAILY (Patient taking differently: TAKE 50 MG BY MOUTH ONCE DAILY) 7 tablet 23  . traMADol (ULTRAM) 50 MG tablet TAKE 1 TABLET BY MOUTH TWICE A DAY TAKE 1 TABLET BY MOUTH TWICE A DAYAS NEEDED FOR PAIN (Patient taking differently: Take 50 mg by mouth twice daily as needed for pain) 56 tablet 4  . vitamin B-12 (CYANOCOBALAMIN) 1000 MCG tablet Take 1,000 mcg by mouth daily.    Marland Kitchen warfarin (COUMADIN) 3 MG tablet Take every other day for 2 weeks then return to clinic for recheck. 30 tablet 1   No current facility-administered medications for this visit.    Allergies  Allergen Reactions  . Cefuroxime Anaphylaxis and Other (See Comments)    Throat swelling  . Cephalexin Rash    Health Maintenance Health Maintenance  Topic Date Due  . OPHTHALMOLOGY EXAM  07/13/2016  . INFLUENZA VACCINE  12/29/2016 (Originally 11/09/2016)  . HEMOGLOBIN A1C  01/25/2017  . FOOT EXAM  07/26/2017  . TETANUS/TDAP  12/28/2025  . PNA vac Low Risk Adult  Completed     Exam:  BP 127/89   Pulse (!) 112   Temp 98.4 F (36.9 C) (Oral)   Ht 5\' 2"  (1.575 m)    Wt 168 lb (76.2 kg)   BMI 30.73 kg/m   Wt Readings from Last 5 Encounters:  11/17/16 168 lb (76.2 kg)  11/15/16 160 lb (72.6 kg)  11/02/16 162 lb (73.5 kg)  10/22/16 154 lb 5.2 oz (70 kg)  08/03/16 157 lb (71.2 kg)    Gen: Dyspneic appearing HEENT: EOMI,  MMM Lungs: Increased work of breathing. Crackles left lower lobe Heart: Tachycardia and irregular. Heart rate estimated around 140 bpm per my check  Abd: NABS, Soft. Nondistended,  Nontender Exts: Brisk capillary refill, warm and well perfused.   12-lead EKG shows atrial fibrillation with RVR at a rate of 117. No significant ST segment depression or elevation.  Results for orders placed or performed during the hospital encounter of 11/15/16 (from the past 72 hour(s))  POCT urinalysis dipstick (new)     Status: Abnormal   Collection Time: 11/15/16  2:58 PM  Result Value Ref Range   Color, UA yellow yellow   Clarity, UA cloudy (A) clear   Glucose, UA negative negative mg/dL   Bilirubin, UA negative negative   Ketones, POC UA negative negative mg/dL   Spec Grav, UA 2.993 7.169 - 1.025   Blood, UA small (A) negative   pH, UA 7.0 5.0 - 8.0   Protein Ur, POC trace (A) negative mg/dL   Urobilinogen, UA 0.2 0.2 or 1.0 E.U./dL   Nitrite, UA Negative Negative   Leukocytes, UA Large (3+) (A) Negative   No results found.    Assessment and Plan: 83 y.o. female with  Atrial fibrillation with RVR with exacerbation of heart failure symptoms. Patient appears ill today. Plan to transfer back to the emergency department for further evaluation and management. She will likely need IV diltiazem infusion as well as IV diuretics and oxygen therapy.    Orders Placed This Encounter  Procedures  . POCT INR   No orders of the defined types were placed in this encounter.    Discussed warning signs or symptoms. Please see discharge instructions. Patient expresses understanding.

## 2016-11-18 ENCOUNTER — Inpatient Hospital Stay (HOSPITAL_COMMUNITY): Payer: Medicare PPO

## 2016-11-18 ENCOUNTER — Telehealth: Payer: Self-pay | Admitting: *Deleted

## 2016-11-18 ENCOUNTER — Other Ambulatory Visit: Payer: Self-pay | Admitting: Physician Assistant

## 2016-11-18 DIAGNOSIS — I4891 Unspecified atrial fibrillation: Secondary | ICD-10-CM

## 2016-11-18 DIAGNOSIS — I361 Nonrheumatic tricuspid (valve) insufficiency: Secondary | ICD-10-CM

## 2016-11-18 LAB — TROPONIN I
Troponin I: 0.03 ng/mL (ref <0.03)
Troponin I: 0.04 ng/mL (ref <0.03)

## 2016-11-18 LAB — TSH
TSH: 2.946 u[IU]/mL (ref 0.350–4.500)
TSH: 3.624 u[IU]/mL (ref 0.350–4.500)

## 2016-11-18 LAB — CBC
HEMATOCRIT: 36.8 % (ref 36.0–46.0)
Hemoglobin: 11.7 g/dL — ABNORMAL LOW (ref 12.0–15.0)
MCH: 27.9 pg (ref 26.0–34.0)
MCHC: 31.8 g/dL (ref 30.0–36.0)
MCV: 87.8 fL (ref 78.0–100.0)
PLATELETS: 261 10*3/uL (ref 150–400)
RBC: 4.19 MIL/uL (ref 3.87–5.11)
RDW: 19.3 % — AB (ref 11.5–15.5)
WBC: 9.8 10*3/uL (ref 4.0–10.5)

## 2016-11-18 LAB — BASIC METABOLIC PANEL
Anion gap: 14 (ref 5–15)
BUN: 24 mg/dL — AB (ref 6–20)
CHLORIDE: 102 mmol/L (ref 101–111)
CO2: 23 mmol/L (ref 22–32)
CREATININE: 1.48 mg/dL — AB (ref 0.44–1.00)
Calcium: 8.9 mg/dL (ref 8.9–10.3)
GFR calc Af Amer: 36 mL/min — ABNORMAL LOW (ref >60)
GFR calc non Af Amer: 31 mL/min — ABNORMAL LOW (ref >60)
Glucose, Bld: 135 mg/dL — ABNORMAL HIGH (ref 65–99)
POTASSIUM: 4.1 mmol/L (ref 3.5–5.1)
SODIUM: 139 mmol/L (ref 135–145)

## 2016-11-18 LAB — ECHOCARDIOGRAM COMPLETE
HEIGHTINCHES: 62 in
WEIGHTICAEL: 2451.52 [oz_av]

## 2016-11-18 LAB — GLUCOSE, CAPILLARY
GLUCOSE-CAPILLARY: 210 mg/dL — AB (ref 65–99)
Glucose-Capillary: 127 mg/dL — ABNORMAL HIGH (ref 65–99)
Glucose-Capillary: 252 mg/dL — ABNORMAL HIGH (ref 65–99)
Glucose-Capillary: 170 mg/dL — ABNORMAL HIGH (ref 65–99)

## 2016-11-18 LAB — PROTIME-INR
INR: 1.17
PROTHROMBIN TIME: 14.9 s (ref 11.4–15.2)

## 2016-11-18 LAB — MRSA PCR SCREENING: MRSA by PCR: NEGATIVE

## 2016-11-18 LAB — URINE CULTURE

## 2016-11-18 LAB — MAGNESIUM: Magnesium: 2.3 mg/dL (ref 1.7–2.4)

## 2016-11-18 MED ORDER — NITROFURANTOIN MONOHYD MACRO 100 MG PO CAPS
100.0000 mg | ORAL_CAPSULE | Freq: Two times a day (BID) | ORAL | Status: DC
  Administered 2016-11-18: 100 mg via ORAL
  Filled 2016-11-18 (×2): qty 1

## 2016-11-18 MED ORDER — LISINOPRIL 20 MG PO TABS
20.0000 mg | ORAL_TABLET | Freq: Every day | ORAL | Status: DC
  Administered 2016-11-18: 20 mg via ORAL
  Filled 2016-11-18 (×2): qty 1

## 2016-11-18 MED ORDER — INSULIN ASPART 100 UNIT/ML ~~LOC~~ SOLN
0.0000 [IU] | Freq: Three times a day (TID) | SUBCUTANEOUS | Status: DC
  Administered 2016-11-18: 3 [IU] via SUBCUTANEOUS
  Administered 2016-11-18: 5 [IU] via SUBCUTANEOUS
  Administered 2016-11-19 (×3): 2 [IU] via SUBCUTANEOUS

## 2016-11-18 MED ORDER — ENOXAPARIN SODIUM 80 MG/0.8ML ~~LOC~~ SOLN
1.0000 mg/kg | SUBCUTANEOUS | Status: DC
  Administered 2016-11-19 – 2016-11-20 (×2): 70 mg via SUBCUTANEOUS
  Filled 2016-11-18 (×2): qty 0.8

## 2016-11-18 MED ORDER — SULFAMETHOXAZOLE-TRIMETHOPRIM 400-80 MG PO TABS: 1.0000 | ORAL_TABLET | Freq: Two times a day (BID) | ORAL | Status: DC

## 2016-11-18 MED ORDER — ONDANSETRON HCL 4 MG PO TABS: 4.0000 mg | ORAL_TABLET | Freq: Four times a day (QID) | ORAL | Status: DC | PRN

## 2016-11-18 MED ORDER — PANTOPRAZOLE SODIUM 40 MG PO TBEC
40.0000 mg | DELAYED_RELEASE_TABLET | Freq: Every day | ORAL | Status: DC
  Administered 2016-11-18 – 2016-11-20 (×3): 40 mg via ORAL
  Filled 2016-11-18 (×3): qty 1

## 2016-11-18 MED ORDER — POLYSACCHARIDE IRON COMPLEX 150 MG PO CAPS
150.0000 mg | ORAL_CAPSULE | Freq: Every day | ORAL | Status: DC
  Administered 2016-11-18 – 2016-11-20 (×3): 150 mg via ORAL
  Filled 2016-11-18 (×3): qty 1

## 2016-11-18 MED ORDER — TORSEMIDE 20 MG PO TABS
50.0000 mg | ORAL_TABLET | Freq: Every day | ORAL | Status: DC
  Administered 2016-11-18 – 2016-11-20 (×3): 50 mg via ORAL
  Filled 2016-11-18: qty 1
  Filled 2016-11-18: qty 3
  Filled 2016-11-18: qty 1

## 2016-11-18 MED ORDER — FOSFOMYCIN TROMETHAMINE 3 G PO PACK
3.0000 g | PACK | Freq: Once | ORAL | Status: AC
  Administered 2016-11-18: 3 g via ORAL
  Filled 2016-11-18: qty 3

## 2016-11-18 MED ORDER — PREDNISONE 5 MG PO TABS
5.0000 mg | ORAL_TABLET | Freq: Every day | ORAL | Status: DC
  Administered 2016-11-18 – 2016-11-20 (×3): 5 mg via ORAL
  Filled 2016-11-18 (×3): qty 1

## 2016-11-18 MED ORDER — LEVOTHYROXINE SODIUM 50 MCG PO TABS
50.0000 ug | ORAL_TABLET | Freq: Every day | ORAL | Status: DC
  Administered 2016-11-18 – 2016-11-20 (×3): 50 ug via ORAL
  Filled 2016-11-18 (×3): qty 1

## 2016-11-18 MED ORDER — ENOXAPARIN SODIUM 80 MG/0.8ML ~~LOC~~ SOLN
1.0000 mg/kg | SUBCUTANEOUS | Status: DC
  Administered 2016-11-18: 70 mg via SUBCUTANEOUS
  Filled 2016-11-18: qty 0.8

## 2016-11-18 MED ORDER — WARFARIN - PHARMACIST DOSING INPATIENT
Freq: Every day | Status: DC
  Administered 2016-11-18: 1

## 2016-11-18 MED ORDER — LEFLUNOMIDE 20 MG PO TABS
10.0000 mg | ORAL_TABLET | Freq: Every day | ORAL | Status: DC
  Administered 2016-11-18 – 2016-11-19 (×3): 10 mg via ORAL
  Filled 2016-11-18 (×4): qty 1

## 2016-11-18 MED ORDER — FENTANYL CITRATE (PF) 100 MCG/2ML IJ SOLN
12.5000 ug | Freq: Once | INTRAMUSCULAR | Status: AC
  Administered 2016-11-18: 12.5 ug via INTRAVENOUS
  Filled 2016-11-18: qty 2

## 2016-11-18 MED ORDER — CLONAZEPAM 0.5 MG PO TABS
0.5000 mg | ORAL_TABLET | Freq: Every day | ORAL | Status: DC
  Administered 2016-11-18 – 2016-11-19 (×3): 0.5 mg via ORAL
  Filled 2016-11-18 (×3): qty 1

## 2016-11-18 MED ORDER — ASPIRIN EC 81 MG PO TBEC
81.0000 mg | DELAYED_RELEASE_TABLET | Freq: Every day | ORAL | Status: DC
  Administered 2016-11-18 – 2016-11-20 (×3): 81 mg via ORAL
  Filled 2016-11-18 (×3): qty 1

## 2016-11-18 MED ORDER — WARFARIN SODIUM 5 MG PO TABS
5.0000 mg | ORAL_TABLET | Freq: Once | ORAL | Status: AC
Start: 2016-11-18 — End: 2016-11-18
  Administered 2016-11-18: 5 mg via ORAL
  Filled 2016-11-18: qty 1

## 2016-11-18 MED ORDER — TRAMADOL HCL 50 MG PO TABS
50.0000 mg | ORAL_TABLET | Freq: Two times a day (BID) | ORAL | Status: DC | PRN
  Administered 2016-11-18 – 2016-11-20 (×3): 50 mg via ORAL
  Filled 2016-11-18 (×4): qty 1

## 2016-11-18 MED ORDER — PANTOPRAZOLE SODIUM 40 MG PO TBEC: 40.0000 mg | DELAYED_RELEASE_TABLET | Freq: Every day | ORAL | Status: DC

## 2016-11-18 MED ORDER — DOCUSATE SODIUM 100 MG PO CAPS
100.0000 mg | ORAL_CAPSULE | Freq: Two times a day (BID) | ORAL | Status: DC
  Administered 2016-11-18 – 2016-11-20 (×4): 100 mg via ORAL
  Filled 2016-11-18 (×5): qty 1

## 2016-11-18 MED ORDER — DEXTROSE 5 % IV SOLN
5.0000 mg/h | INTRAVENOUS | Status: DC
  Administered 2016-11-18: 5 mg/h via INTRAVENOUS
  Administered 2016-11-18: 10 mg/h via INTRAVENOUS
  Filled 2016-11-18 (×2): qty 100

## 2016-11-18 MED ORDER — DILTIAZEM HCL ER COATED BEADS 240 MG PO CP24
240.0000 mg | ORAL_CAPSULE | Freq: Every day | ORAL | Status: DC
  Administered 2016-11-18 – 2016-11-20 (×3): 240 mg via ORAL
  Filled 2016-11-18 (×3): qty 1

## 2016-11-18 MED ORDER — METOPROLOL SUCCINATE ER 25 MG PO TB24
25.0000 mg | ORAL_TABLET | Freq: Every day | ORAL | Status: DC
  Administered 2016-11-18 – 2016-11-20 (×3): 25 mg via ORAL
  Filled 2016-11-18 (×3): qty 1

## 2016-11-18 MED ORDER — ONDANSETRON HCL 4 MG/2ML IJ SOLN: 4.0000 mg | Freq: Four times a day (QID) | INTRAMUSCULAR | Status: DC | PRN

## 2016-11-18 MED ORDER — MAGNESIUM OXIDE 400 (241.3 MG) MG PO TABS
400.0000 mg | ORAL_TABLET | Freq: Two times a day (BID) | ORAL | Status: DC
  Administered 2016-11-18 – 2016-11-20 (×5): 400 mg via ORAL
  Filled 2016-11-18 (×5): qty 1

## 2016-11-18 MED ORDER — PRAVASTATIN SODIUM 40 MG PO TABS
40.0000 mg | ORAL_TABLET | Freq: Every day | ORAL | Status: DC
  Administered 2016-11-18 – 2016-11-19 (×2): 40 mg via ORAL
  Filled 2016-11-18 (×2): qty 1

## 2016-11-18 NOTE — Plan of Care (Signed)
Problem: Education: Goal: Knowledge of [facility] Education information/materials will improve Outcome: Progressing Discussed with the patient and family member by phone plan of care with some teach back displayed

## 2016-11-18 NOTE — ED Notes (Signed)
Admitting Provider at bedside. 

## 2016-11-18 NOTE — Progress Notes (Signed)
  Echocardiogram 2D Echocardiogram has been performed.  Leta Jungling M 11/18/2016, 10:46 AM

## 2016-11-18 NOTE — Progress Notes (Addendum)
PROGRESS NOTE  [name] YQI:[id number] DOB: 1929/10/11 DOA: 11/17/2016 PCP: Rodolph Bong, MD  HPI/Recap of past 24 hours:  Feeling better, heart rate has improved, she remains on cardizem drip She report chronic neck pain and shoulder pain from her rheumatoid arthritis, no cough, no chest pain, no fever Report dysuria has resolved today She reports had relocated to [place] from [place] a yr ago, now she lives in a independent living facility, she does not drive, she walks with a cane, denies recent falls.  Assessment/Plan: Principal Problem:   Atrial fibrillation with RVR (HCC) Active Problems:   Hypothyroidism   Rheumatoid arthritis (HCC)   Diabetes mellitus with renal complications (HCC)   UTI (urinary tract infection)   Anemia   CAD (coronary artery disease)   Hyperlipidemia   Hypertension   Atrial fibrillation with RVR (HCC) CHADS-VASc Score of 7. Continue home medsmetoprolol succinate 25 mg by mouth daily. She is started on Cardizem infusion since admission tsh 3.6, k4.1, mag 2.3 echocardiogram pending. Patient had been hospitalized on 7/12 for the same, she had several other ED and pmd visit for the same since after recent hospitalization She has not seen a cardiology since she relocated back to [place] a year ago, will consult Cardiology.   Subtherapeutic INR: INR 1.1 on admission Lovenox SQ since INR on admission is subtherapeutic (1.1). Warfarin per pharmacy.  Chronic Diastolic CHF: euvolemic on exam, cxr no pulmonary edema Continue home dose diuretics Echo pending Cardiology consulted  CAD (coronary artery disease), h/o CABG Denies chest pain at this time. Continue aspirin, beta blocker and statin.  Hypertension Continue lisinopril 20 mg by mouth daily. Continue metoprolol XL 25 mg by mouth daily. She is currently on cardizem drip, Cardiology consulted  Diabetes mellitus with renal complications (HCC), likely secondary to  chronic steroid use a1c in 07/2016 7.1, she is not on any diabetes meds at home, continue Carbohydrate modified diet. Repeat a1c, on ssi here I have obtained and reviewed medical record from her podiatry , apperantly she had recent treatment for diabetic foot ulcer two weeks ago, on exam today  no significant foot wound.  Hyperlipidemia Continue pravastatin 40 mg by mouth daily. lft wnl Last lipid file was obtained in 2011, will repeat fasting lipid in am.  CKDIII/IV: Cr and gfr close to baseline Renal dosing meds  Anemia, likely anemia from chronic disease Iron studies in July last year were normal. Continue home iron supplementation. hgb stable at baseline 10-11  Rheumatoid arthritis (HCC)/ immunosuppressed state Continue Arava 10 mg by mouth daily and prednisone 5 mg by mouth daily. Report chronic neck pain and shoulder pain from RA, on prn analgesics.    UTI (urinary tract infection) Recently diagnosed by urgent care on 8/7, she is stated on macrobid , but she only started this for two days,  I have reviewed her past urine culture, in the past she has multiple uti with some of bacteria that is resistant to macrobid and ampicillin, she has reduced gfr, macrobid will not be a good choice  D/c macrobid, will give fosfomycinx 1dose. Repeat ua on admission looks clean but she has been on abx for two days, and she is immunosuppressed, urine culture pending  Hypothyroidism tSH level 3.6 Continue levothyroxine  Possible early dementia, she report it is year 2017, but able to provide reasonable history.    DVT prophylaxis: On warfarin.  Code Status: need to verify code status, there was DNR documented on 7/13,showing full code this  admission  Family Communication: patient   Disposition Plan: remain in stepdown on cardizem drip,echo pending, cardiology consult pending   Consultants:  cardiology  Procedures:  none  Antibiotics:  none   Objective: BP 108/68    Pulse 83   Temp 98.2 F (36.8 C) (Oral)   Resp (!) 21   Ht 5\' 2"  (1.575 m)   Wt 69.5 kg (153 lb 3.5 oz)   SpO2 96%   BMI 28.02 kg/m   Intake/Output Summary (Last 24 hours) at 11/18/16 1038 Last data filed at 11/18/16 0900  Gross per 24 hour  Intake            465.5 ml  Output                0 ml  Net            465.5 ml   Filed Weights   11/17/16 1727 11/18/16 0600  Weight: 70.3 kg (155 lb) 69.5 kg (153 lb 3.5 oz)    Exam: Patient is examined daily including today on 11/18/2016, exam remain the same as of yesterday except that is highlighted below   General:  Frail, NAD  Cardiovascular: IRRR, tachycardia has improved  Respiratory: CTABL  Abdomen: Soft/ND/NT, positive BS  Musculoskeletal: No Edema  Neuro: slight impaired memory, report it is 2017, oriented to place and person, able to provide reasonable history.   Data Reviewed: Basic Metabolic Panel:  Recent Labs Lab 11/17/16 1753 11/18/16 0031 11/18/16 0613  NA 139  --  139  K 4.7  --  4.1  CL 102  --  102  CO2 26  --  23  GLUCOSE 180*  --  135*  BUN 21*  --  24*  CREATININE 1.38*  --  1.48*  CALCIUM 9.3  --  8.9  MG  --  2.3  --    Liver Function Tests:  Recent Labs Lab 11/17/16 1753  AST 23  ALT 23  ALKPHOS 100  BILITOT 0.9  PROT 7.1  ALBUMIN 3.2*    Recent Labs Lab 11/17/16 1753  LIPASE 41   No results for input(s): AMMONIA in the last 168 hours. CBC:  Recent Labs Lab 11/17/16 1753 11/18/16 0613  WBC 8.7 9.8  NEUTROABS 5.6  --   HGB 11.8* 11.7*  HCT 37.4 36.8  MCV 88.4 87.8  PLT 288 261   Cardiac Enzymes:    Recent Labs Lab 11/18/16 0031 11/18/16 0613  TROPONINI 0.03* 0.04*   BNP (last 3 results)  Recent Labs  10/20/16 2132 11/17/16 1753  BNP 345.8* 174.2*    ProBNP (last 3 results) No results for input(s): PROBNP in the last 8760 hours.  CBG:  Recent Labs Lab 11/18/16 0757  GLUCAP 127*    Recent Results (from the past 240 hour(s))  Urine culture      Status: None   Collection Time: 11/15/16  2:58 PM  Result Value Ref Range Status   Culture KLEBSIELLA PNEUMONIAE  Final   Colony Count Greater than 100,000 CFU/mL  Final   Organism ID, Bacteria KLEBSIELLA PNEUMONIAE  Final      Susceptibility   Klebsiella pneumoniae -  (no method available)    AMPICILLIN  Resistant     AMOX/CLAVULANIC <=2 Sensitive     AMPICILLIN/SULBACTAM <=2 Sensitive     PIP/TAZO <=4 Sensitive     IMIPENEM <=0.25 Sensitive     CEFAZOLIN <=4 Not Reportable     CEFTRIAXONE <=1 Sensitive  CEFTAZIDIME <=1 Sensitive     CEFEPIME <=1 Sensitive     GENTAMICIN <=1 Sensitive     TOBRAMYCIN <=1 Sensitive     CIPROFLOXACIN <=0.25 Sensitive     LEVOFLOXACIN <=0.12 Sensitive     NITROFURANTOIN 32 Sensitive     TRIMETH/SULFA* <=20 Sensitive      * NR=NOT REPORTABLE,SEE COMMENTORAL therapy:A cefazolin MIC of <32 predicts susceptibility to the oral agents cefaclor,cefdinir,cefpodoxime,cefprozil,cefuroxime,cephalexin,and loracarbef when used for therapy of uncomplicated UTIs due to E.coli,K.pneumomiae,and P.mirabilis. PARENTERAL therapy: A cefazolinMIC of >8 indicates resistance to parenteralcefazolin. An alternate test method must beperformed to confirm susceptibility to parenteralcefazolin.  MRSA PCR Screening     Status: None   Collection Time: 11/18/16  5:54 AM  Result Value Ref Range Status   MRSA by PCR NEGATIVE NEGATIVE Final    Comment:        The GeneXpert MRSA Assay (FDA approved for NASAL specimens only), is one component of a comprehensive MRSA colonization surveillance program. It is not intended to diagnose MRSA infection nor to guide or monitor treatment for MRSA infections.      Studies: Dg Chest 2 View  Result Date: 11/17/2016 CLINICAL DATA:  Cough, palpitations, and weakness for 5 days. Coronary artery disease. EXAM: CHEST  2 VIEW COMPARISON:  11/02/2016 FINDINGS: Stable mild cardiomegaly and prior CABG. Both lungs are clear. No evidence of  pneumothorax or pleural effusion. Aortic atherosclerosis. IMPRESSION: Stable mild cardiomegaly.  No active lung disease. Electronically Signed   By: Myles Rosenthal M.D.   On: 11/17/2016 18:47    Scheduled Meds: . aspirin EC  81 mg Oral Daily  . clonazePAM  0.5 mg Oral QHS  . docusate sodium  100 mg Oral BID  . enoxaparin (LOVENOX) injection  1 mg/kg Subcutaneous Q24H  . insulin aspart  0-9 Units Subcutaneous TID WC  . iron polysaccharides  150 mg Oral Daily  . leflunomide  10 mg Oral QHS  . levothyroxine  50 mcg Oral QAC breakfast  . lisinopril  20 mg Oral Daily  . magnesium oxide  400 mg Oral BID  . metoprolol succinate  25 mg Oral Daily  . nitrofurantoin (macrocrystal-monohydrate)  100 mg Oral BID  . pantoprazole  40 mg Oral Daily  . pravastatin  40 mg Oral Daily  . predniSONE  5 mg Oral Q breakfast  . torsemide  50 mg Oral Daily  . warfarin  5 mg Oral ONCE-1800  . Warfarin - Pharmacist Dosing Inpatient   Does not apply q1800    Continuous Infusions: . diltiazem (CARDIZEM) infusion 10 mg/hr (11/18/16 0537)     Time spent: 35 mins from 9am to 9:35am I have personally reviewed and interpreted daily labs, tele strips,ekg, imagings as discussed above under date review session and assessment and plans. Case discussed with cardiology Obtained and reviewed outside records as mention above.  Elisah Parmer MD, PhD  [facility] Pager [phone]. If 7PM-7AM, please contact night-coverage at [email or web address], password [facility] 11/18/2016, 10:38 AM  LOS: 1 day

## 2016-11-18 NOTE — Plan of Care (Signed)
Problem: Cardiac: Goal: Ability to achieve and maintain adequate cardiopulmonary perfusion will improve Outcome: Progressing Discussed with patient and family plan of care, cardiology notes and ambulation for the shift with some teach back displayed.

## 2016-11-18 NOTE — Care Management Note (Addendum)
Case Management Note  Patient Details  Name: [name] MRN: [id number] Date of Birth: 1930/01/19  Subjective/Objective:   From [facility] in [place], presents with weakness and afib, on cardizem drip. Await pt/ot eval.                  Action/Plan: NCM will follow for dc needs.    Expected Discharge Date:  11/21/16               Expected Discharge Plan:     In-House Referral:     Discharge planning Services  CM Consult  Post Acute Care Choice:    Choice offered to:     DME Arranged:    DME Agency:     HH Arranged:    HH Agency:     Status of Service:  In process, will continue to follow  If discussed at Long Length of Stay Meetings, dates discussed:    Additional Comments:  Leone Haven, RN 11/18/2016, 4:02 PM

## 2016-11-18 NOTE — Telephone Encounter (Signed)
Spoke to [employer] given Ucx results. Advised him that the Macrobid she was given would cover her infection. Ask him to please notify her nurse at the hospital of the results.

## 2016-11-18 NOTE — Progress Notes (Signed)
ANTICOAGULATION CONSULT NOTE - Initial Consult  Pharmacy Consult for Coumadin Indication: atrial fibrillation  Allergies  Allergen Reactions  . Cefuroxime Anaphylaxis and Other (See Comments)    Throat swelling  . Cephalexin Rash    Patient Measurements: Height: 5\' 2"  (157.5 cm) Weight: 155 lb (70.3 kg) IBW/kg (Calculated) : 50.1  Vital Signs: Temp: 98.5 F (36.9 C) (08/09 1726) Temp Source: Oral (08/09 1530) BP: 129/67 (08/10 0130) Pulse Rate: 82 (08/10 0130)  Labs:  Recent Labs  11/17/16 1541 11/17/16 1753 11/18/16 0031  HGB  --  11.8*  --   HCT  --  37.4  --   PLT  --  288  --   LABPROT  --  14.3  --   INR 1.1 1.11  --   CREATININE  --  1.38*  --   TROPONINI  --   --  0.03*    Estimated Creatinine Clearance: 27.9 mL/min (A) (by C-G formula based on SCr of 1.38 mg/dL (H)).   Medical History: Past Medical History:  Diagnosis Date  . Acute pancreatitis   . Anemia   . Arthritis   . Atrial fibrillation (HCC)   . CAD (coronary artery disease)   . Carotid artery occlusion   . CHF (congestive heart failure) (HCC)   . Diabetes mellitus age 54  . DJD (degenerative joint disease)   . DVT (deep venous thrombosis) (HCC)   . GERD (gastroesophageal reflux disease)   . GI bleed   . Hyperlipidemia   . Hypertension   . Joint pain   . Mesenteric ischemia   . Peripheral vascular disease (HCC)   . Thyroid disease     Assessment: 83yo female transported from PCP to ED for Afib w/ RVR, had been newly dx'd 50mo ago, to continue Coumadin and start LMWH bridge for low INR; on 7/25 pt had INR of 5.5 and PCP changed Coumadin from 3mg  daily to 3mg  every other day and held allopurinol, INR now close to baseline w/ last dose of Coumadin taken 8/9.  Goal of Therapy:  INR 2-3   Plan:  Will give boosted Coumadin dose of 5mg  today and monitor INR for dose adjustments; MD ordered Lovenox 1mg /kg Q12H for bridge, will change to Q24H for renal function.  , PharmD,  BCPS  11/18/2016,2:02 AM

## 2016-11-18 NOTE — Consult Note (Signed)
Cardiology Consultation:   Patient ID: [name]; [id number]; Feb 01, 1930   Admit date: 11/17/2016 Date of Consult: 11/18/2016  Primary Care Provider: Rodolph Bong, MD Primary Cardiologist: new - Dr. Royann Shivers Primary Electrophysiologist:  Dr. Ladona Ridgel (last seen 2010)   Patient Profile:   [name] is a 83 y.o. female with a hx of chronic diastolic heart failure, sinus bradycardia, HTN, HLD, hx DVT, hx of Afib, CAD s/p CABG x 3 (LIMA to LAD, SVG to OM1 and OM3, SVG to marginal), DM, and thyroid disease who is being seen today for the evaluation of atrial fibrillation and CHF at the request of Dr. Roda Shutters.  History of Present Illness:   [name] last saw Dr. Dietrich Pates in clinic in 2011. Per EPIC, he has not followed with cardiology here or in [facility].  At that time, she was on lifelong [facility] with coumadin for paroxysmal Afib, cardizem and lopressor. At one point, she was on low dose amiodarone and lopressor. Heart cath in 01/28/2008 with diffuse disease and she was referred to CT surgery for CABG (LIMA to LAD, SVG to OM1 and OM3, SVG to marginal). She was stable following her CABG. Tight control of her lipids necessary for cerebrovascular disease. She since moved to [place], [place]. She has returned to be near her children. She has not re-established care with cardiology since returning to [facility].  EPIC review shows recent ED visit 10/20/16 for B leg pain and she was found in Afib RVR and started on cardizem drip. Cardizem was transitioned back to PO dosing and she remained rate-controlled. Cardiology was not consulted at that time. She was discharged 10/24/16.  She was seen at urgent care in [place] for a UTI and weakness and was found to be in Afib RVR in the 120-130s. She was sent to [facility] for further treatment. On arrival, she was placed on a diltiazem drip for rate control. Troponin was negative, BNP 174, CXR with stable cardiomegaly. Repeat UA normal. She was transferred to 58M.   She  remains rate-controlled Afib on cardizem drip running at 5 mg/hr. On my interview, she denies chest pain. She states that normally she can climb a flight of stairs, but is short of breath when she reaches the top. She presented with weakness and shortness of breath with just walking to the bathroom. She states that at baseline, her heart rate is controlled while at rest, but increases as she walks or does activity, which can make her short of breath. She can sometimes feel palpitations when walking.    Past Medical History:  Diagnosis Date  . Acute pancreatitis   . Anemia   . Arthritis   . Atrial fibrillation (HCC)   . CAD (coronary artery disease)   . Carotid artery occlusion   . CHF (congestive heart failure) (HCC)   . Diabetes mellitus age 89  . DJD (degenerative joint disease)   . DVT (deep venous thrombosis) (HCC)   . GERD (gastroesophageal reflux disease)   . GI bleed   . Hyperlipidemia   . Hypertension   . Joint pain   . Mesenteric ischemia   . Peripheral vascular disease (HCC)   . Thyroid disease     Past Surgical History:  Procedure Laterality Date  . APPENDECTOMY    . CELIAC ARTERY STENT    . CORONARY ARTERY BYPASS GRAFT    . EMBOLECTOMY  2009   right leg  . FASCIOTOMY     right leg  . HIP FRACTURE  SURGERY Left   . JOINT REPLACEMENT    . TOTAL KNEE ARTHROPLASTY     bilateral  . TUBAL LIGATION    . VISCERAL ANGIOGRAM N/A 05/31/2013   Procedure: MESSENTERIC [name];  Surgeon: [name], MD;  Location: [facility] CATH LAB;  Service: Cardiovascular;  Laterality: N/A;     Inpatient Medications: Scheduled Meds: . aspirin EC  81 mg Oral Daily  . clonazePAM  0.5 mg Oral QHS  . docusate sodium  100 mg Oral BID  . enoxaparin (LOVENOX) injection  1 mg/kg Subcutaneous Q24H  . insulin aspart  0-9 Units Subcutaneous TID WC  . iron polysaccharides  150 mg Oral Daily  . leflunomide  10 mg Oral QHS  . levothyroxine  50 mcg Oral QAC breakfast  . lisinopril  20 mg Oral  Daily  . magnesium oxide  400 mg Oral BID  . metoprolol succinate  25 mg Oral Daily  . nitrofurantoin (macrocrystal-monohydrate)  100 mg Oral BID  . pantoprazole  40 mg Oral Daily  . pravastatin  40 mg Oral Daily  . predniSONE  5 mg Oral Q breakfast  . torsemide  50 mg Oral Daily  . warfarin  5 mg Oral ONCE-1800  . Warfarin - Pharmacist Dosing Inpatient   Does not apply q1800   Continuous Infusions: . diltiazem (CARDIZEM) infusion 10 mg/hr (11/18/16 0537)   PRN Meds: ondansetron **OR** ondansetron (ZOFRAN) IV, traMADol  Allergies:    Allergies  Allergen Reactions  . Cefuroxime Anaphylaxis and Other (See Comments)    Throat swelling  . Fish Allergy Shortness Of Breath    Pt said she has throat swelling   . Cephalexin Rash    Social History:   Social History   Social History  . Marital status: Widowed    Spouse name: N/A  . Number of children: N/A  . Years of education: N/A   Occupational History  . Not on file.   Social History Main Topics  . Smoking status: Never Smoker  . Smokeless tobacco: Never Used  . Alcohol use No  . Drug use: No  . Sexual activity: Not on file   Other Topics Concern  . Not on file   Social History Narrative  . No narrative on file    Family History:    Family History  Problem Relation Age of Onset  . Cancer Mother        ovarian  . Other Father        suicide  . Coronary artery disease Sister   . Other Sister        alzheimers  . Heart disease Sister        Before age 61  . Diabetes Sister   . Cancer Daughter        spinal tumor  . Diabetes Daughter   . Hypertension Daughter   . Diabetes Son   . Hyperlipidemia Son   . Hypertension Son      ROS:  Please see the history of present illness.  ROS  All other ROS reviewed and negative.     Physical Exam/Data:   Vitals:   11/18/16 0754 11/18/16 0800 11/18/16 1210 11/18/16 1300  BP: 112/69 108/68 (!) 151/136 107/66  Pulse: 87 83 71 65  Resp: 19 (!) 21 19 19   Temp:  98.2 F (36.8 C)  98.4 F (36.9 C)   TempSrc: Oral  Oral   SpO2: 93% 96% 94% 94%  Weight:      Height:  Intake/Output Summary (Last 24 hours) at 11/18/16 1428 Last data filed at 11/18/16 1400  Gross per 24 hour  Intake            915.5 ml  Output              375 ml  Net            540.5 ml   Filed Weights   11/17/16 1727 11/18/16 0600  Weight: 155 lb (70.3 kg) 153 lb 3.5 oz (69.5 kg)   Body mass index is 28.02 kg/m.  General:  Well nourished, well developed, in no acute distress HEENT: normal Lymph: no adenopathy Neck: no JVD Endocrine:  No thryomegaly Vascular: No carotid bruits; FA pulses 2+ bilaterally without bruits  Cardiac:  Irregular rhythm, regular rate; no murmur  Lungs:  clear to auscultation bilaterally, no wheezing, rhonchi or rales  Abd: soft, nontender, no hepatomegaly  Ext: trace B LE edema to her knees Musculoskeletal:  No deformities, BUE and BLE strength normal and equal Skin: warm and dry  Neuro:  CNs 2-12 intact, no focal abnormalities noted Psych:  Normal affect   EKG:  The EKG was personally reviewed and demonstrates:  Afib, LVH Telemetry:  Telemetry was personally reviewed and demonstrates:  Rate controlled Afib  Relevant CV Studies:  Echocardiogram 11/18/16: Study Conclusions - Left ventricle: The cavity size was normal. There was severe   concentric hypertrophy. Systolic function was normal. The   estimated ejection fraction was in the range of 55% to 60%. Wall   motion was normal; there were no regional wall motion   abnormalities. The study was not technically sufficient to allow   evaluation of LV diastolic dysfunction due to atrial   fibrillation. - Aortic valve: Valve mobility was restricted. There was mild   stenosis. Valve area (VTI): 1.89 cm^2. Valve area (Vmax): 1.96   cm^2. Valve area (Vmean): 1.7 cm^2. - Aortic root: The aortic root was normal in size. - Mitral valve: Calcified annulus. Mildly thickened leaflets .    There was moderate regurgitation. - Left atrium: The atrium was severely dilated. - Right ventricle: The cavity size was mildly dilated. Wall   thickness was normal. Systolic function was mildly reduced. - Right atrium: The atrium was moderately dilated. - Tricuspid valve: There was mild regurgitation. - Pulmonary arteries: Systolic pressure was at the upper limits of   normal. - Inferior vena cava: The vessel was normal in size. - Pericardium, extracardiac: There was no pericardial effusion.  Laboratory Data:  Chemistry Recent Labs Lab 11/17/16 1753 11/18/16 0613  NA 139 139  K 4.7 4.1  CL 102 102  CO2 26 23  GLUCOSE 180* 135*  BUN 21* 24*  CREATININE 1.38* 1.48*  CALCIUM 9.3 8.9  GFRNONAA 34* 31*  GFRAA 39* 36*  ANIONGAP 11 14     Recent Labs Lab 11/17/16 1753  PROT 7.1  ALBUMIN 3.2*  AST 23  ALT 23  ALKPHOS 100  BILITOT 0.9   Hematology Recent Labs Lab 11/17/16 1753 11/18/16 0613  WBC 8.7 9.8  RBC 4.23 4.19  HGB 11.8* 11.7*  HCT 37.4 36.8  MCV 88.4 87.8  MCH 27.9 27.9  MCHC 31.6 31.8  RDW 19.7* 19.3*  PLT 288 261   Cardiac Enzymes Recent Labs Lab 11/18/16 0031 11/18/16 0613  TROPONINI 0.03* 0.04*    Recent Labs Lab 11/17/16 1946  TROPIPOC 0.00    BNP Recent Labs Lab 11/17/16 1753  BNP 174.2*    DDimer  No results for input(s): DDIMER in the last 168 hours.  Radiology/Studies:  Dg Chest 2 View  Result Date: 11/17/2016 CLINICAL DATA:  Cough, palpitations, and weakness for 5 days. Coronary artery disease. EXAM: CHEST  2 VIEW COMPARISON:  11/02/2016 FINDINGS: Stable mild cardiomegaly and prior CABG. Both lungs are clear. No evidence of pneumothorax or pleural effusion. Aortic atherosclerosis. IMPRESSION: Stable mild cardiomegaly.  No active lung disease. Electronically Signed   By: Myles Rosenthal M.D.   On: 11/17/2016 18:47    Assessment and Plan:   1. Atrial fibrillation, hx of sinus bradycardia - transition IV diltiazem to PO diltiazem  - home dose was 240 mg daily. Given her history of bradycardia, consider giving 120 mg daily first and titrate to 240 mg daily - continue toprol 25 mg daily (home dose) - continue coumadin for [facility] This patients CHA2DS2-VASc Score and unadjusted Ischemic Stroke Rate (% per year) is equal to 11.2 % stroke rate/year from a score of 7 (CHF, HTN, DM, age, female, carotid artery stenosis) - this bout of RVR likely related to her UTI - will follow up in clinic and make medication adjustments as needed   2. Chronic diastolic heart failure - patient is not in an acute exacerbation - echo unable to assess diastolic function, but severe LVH - preserved LV function - continue home diuretic of 50 mg torsemide daily   3. HTN - home lisinopril continued   4. CAD s/p CABG (? 2009) - stable, no angina   Signed, Marcelino Duster, [place]  11/18/2016 2:28 PM   I have seen and examined the patient along with Marcelino Duster, PA .  I have reviewed the chart, notes and new data.  I agree with NP's note.  Key new complaints: palpitations have resolved and breathing is back to baseline Key examination changes: irregular rhythm, otherwise unremarkable CV exam, no signs of hypervolemia Key new findings / data: atrial fibrillation with controlled rate, echo with "severe" LVH (actually moderate by measurements) and severely dilated left atrium. INR subtherapeutic.  PLAN: High likelihood that she is now in permanent atrial fibrillation. Normal sinus rhythm has not been documented in many years. Current problems with RVR were related to her noncardiac acute illness.  She was clearly bradycardic when on a combination of metoprolol succinate 25 mg daily and diltiazem 360 mg daily (April 2017). She has been well compensated on metoprolol succ 25 mg daily + diltiazem 240 mg daily for a long time. I believe she just needs to go back on that same regimen.  INR was supratherapeutic (antibiotic related?) and  warfarin was reduced, also she missed warfarin doses due to a problem with her pharmacy.  Discussed DOAC with her, but she prefers not to change.  Will make sure she is reintegrated in Cardiology f/u. She is very interested in follow up close to home in [place] and we ill try to get her in to see Dr. [name].   Thurmon Fair, MD, [facility] [phone] 11/18/2016, 4:45 PM

## 2016-11-18 NOTE — ED Notes (Signed)
Attempted report x 1 to 43M

## 2016-11-19 LAB — GLUCOSE, CAPILLARY
GLUCOSE-CAPILLARY: 179 mg/dL — AB (ref 65–99)
GLUCOSE-CAPILLARY: 181 mg/dL — AB (ref 65–99)
GLUCOSE-CAPILLARY: 187 mg/dL — AB (ref 65–99)
GLUCOSE-CAPILLARY: 149 mg/dL — AB (ref 65–99)

## 2016-11-19 LAB — LIPID PANEL
Cholesterol: 142 mg/dL (ref 0–200)
HDL: 25 mg/dL — AB (ref >40)
LDL Cholesterol: 69 mg/dL (ref 0–99)
Total CHOL/HDL Ratio: 5.7 RATIO
Triglycerides: 242 mg/dL — ABNORMAL HIGH (ref <150)
VLDL: 48 mg/dL — ABNORMAL HIGH (ref 0–40)

## 2016-11-19 LAB — BASIC METABOLIC PANEL
ANION GAP: 12 (ref 5–15)
BUN: 40 mg/dL — ABNORMAL HIGH (ref 6–20)
CO2: 23 mmol/L (ref 22–32)
Calcium: 8.7 mg/dL — ABNORMAL LOW (ref 8.9–10.3)
Chloride: 99 mmol/L — ABNORMAL LOW (ref 101–111)
Creatinine, Ser: 1.96 mg/dL — ABNORMAL HIGH (ref 0.44–1.00)
GFR, EST AFRICAN AMERICAN: 26 mL/min — AB (ref >60)
GFR, EST NON AFRICAN AMERICAN: 22 mL/min — AB (ref >60)
Glucose, Bld: 166 mg/dL — ABNORMAL HIGH (ref 65–99)
POTASSIUM: 4 mmol/L (ref 3.5–5.1)
SODIUM: 134 mmol/L — AB (ref 135–145)

## 2016-11-19 LAB — PROTIME-INR
INR: 1.12
Prothrombin Time: 14.4 seconds (ref 11.4–15.2)

## 2016-11-19 MED ORDER — SODIUM CHLORIDE 0.9% FLUSH
3.0000 mL | Freq: Two times a day (BID) | INTRAVENOUS | Status: DC
  Administered 2016-11-19 – 2016-11-20 (×2): 3 mL via INTRAVENOUS

## 2016-11-19 MED ORDER — WARFARIN SODIUM 5 MG PO TABS
5.0000 mg | ORAL_TABLET | Freq: Once | ORAL | Status: AC
  Administered 2016-11-19: 5 mg via ORAL
  Filled 2016-11-19: qty 1

## 2016-11-19 NOTE — Progress Notes (Signed)
Report called to [facility] - spoke to [employer]. Pt aware and family of room # 3 West bed 4.

## 2016-11-19 NOTE — Progress Notes (Signed)
Patient ambulated in the hallway and had a oxygen saturation rate of 88-89% and heart rate 99-100.

## 2016-11-19 NOTE — Evaluation (Signed)
Occupational Therapy Evaluation Patient Details Name: [name] MRN: [id number] DOB: 03-27-1930 Today's Date: 11/19/2016    History of Present Illness [name] is a 83 y.o. female with medical history significant of pancreatitis, chronic anemia, osteoarthritis, RA, chronic atrial fibrillation, CAD, carotid artery disease, unspecified CHF, DVT, peripheral vascular disease, hypertension, GERD, history of GI bleed, history of mesenteric ischemia, hyperlipidemia, hypothyroidism who is coming to the emergency department referred by the [facility] clinic for weakness for the past 3 days and atrial fibrillation with RVR in the 120s and 130s   Clinical Impression   This 83 yo female admitted with above presents to acute OT at a Min guard A or better. Pt is normally Mod I in her own apartment with 4RW. She will benefit from acute OT to work back to [facility] without need for follow up.    Follow Up Recommendations  No OT follow up;Supervision - Intermittent    Equipment Recommendations  None recommended by OT       Precautions / Restrictions Precautions Precautions: Fall Restrictions Weight Bearing Restrictions: No      Mobility Bed Mobility               General bed mobility comments: Pt up in recliner upon my arrival  Transfers Overall transfer level: Needs assistance Equipment used: 4-wheeled walker Transfers: Sit to/from Stand Sit to Stand: Min guard              Balance Overall balance assessment: Needs assistance Sitting-balance support: Feet supported;No upper extremity supported Sitting balance-Leahy Scale: Good     Standing balance support: During functional activity;No upper extremity supported Standing balance-Leahy Scale: Fair Standing balance comment: can stand and perform peri care without UE support, but has 4RW in front of her                           ADL either performed or assessed with clinical judgement   ADL Overall ADL's  : Needs assistance/impaired Eating/Feeding: Independent;Sitting   Grooming: Set up;Sitting   Upper Body Bathing: Set up;Sitting   Lower Body Bathing: Min guard;Sit to/from stand   Upper Body Dressing : Set up;Sitting   Lower Body Dressing: Min guard;Sit to/from stand   Toilet Transfer: Min guard;Ambulation;RW;BSC (over toilet)   Toileting- Clothing Manipulation and Hygiene: Min guard;Sit to/from stand               Vision Baseline Vision/History: Wears glasses Wears Glasses: At all times (but she does not have them here) Patient Visual Report: No change from baseline              Pertinent Vitals/Pain Pain Assessment: No/denies pain     Hand Dominance Right   Extremity/Trunk Assessment Upper Extremity Assessment Upper Extremity Assessment: RUE deficits/detail;LUE deficits/detail RUE Deficits / Details: OA/RA throughout, really noticable for RA in hands/wrists--but she has adjusted and is able to do all of her basic ADLs and most IADLs RUE Coordination: decreased fine motor;decreased gross motor LUE Deficits / Details: OA/RA throughout, really noticable for RA in hands/wrists--but she has adjusted and is able to do all of her basic ADLs and most IADLs LUE Coordination: decreased fine motor;decreased gross motor           Communication Communication Communication: No difficulties   Cognition Arousal/Alertness: Awake/alert Behavior During Therapy: WFL for tasks assessed/performed Overall Cognitive Status: Within Functional Limits for tasks assessed  Home Living Family/patient expects to be discharged to:: Private residence Living Arrangements: Alone Available Help at Discharge: Available PRN/intermittently Type of Home:  (they only provide meals) Home Access: Level entry     Home Layout: One level     Bathroom Shower/Tub: Walk-in shower;Curtain   Bathroom Toilet: Handicapped height      Home Equipment: Walker - 4 wheels;Grab bars - toilet;Bedside commode;Shower seat - built in;Grab bars - tub/shower;Hand held shower head          Prior Functioning/Environment Level of Independence: Independent with assistive device(s)        Comments: uses a 4RW at home        OT Problem List: Impaired balance (sitting and/or standing)      OT Treatment/Interventions: Self-care/ADL training;Patient/family education;Balance training;DME and/or AE instruction    OT Goals(Current goals can be found in the care plan section) Acute Rehab OT Goals Patient Stated Goal: to go home today OT Goal Formulation: With patient Time For Goal Achievement: 11/26/16 Potential to Achieve Goals: Good  OT Frequency: Min 2X/week              AM-PAC PT "6 Clicks" Daily Activity     Outcome Measure Help from another person eating meals?: None Help from another person taking care of personal grooming?: A Little Help from another person toileting, which includes using toliet, bedpan, or urinal?: A Little Help from another person bathing (including washing, rinsing, drying)?: A Little Help from another person to put on and taking off regular upper body clothing?: A Little Help from another person to put on and taking off regular lower body clothing?: A Little 6 Click Score: 19   End of Session Equipment Utilized During Treatment: Rolling walker  Activity Tolerance: Patient tolerated treatment well Patient left: in chair;with call bell/phone within reach (forgot to turn chair alarm back on, just called unit and asked them to please do this)  OT Visit Diagnosis: Unsteadiness on feet (R26.81)                Time: 3007-6226 OT Time Calculation (min): 20 min Charges:  OT General Charges $OT Visit: 1 Procedure OT Evaluation $OT Eval Moderate Complexity: 1 Procedure Ignacia Palma, OTR/L [phone] 11/19/2016

## 2016-11-19 NOTE — Progress Notes (Signed)
ANTICOAGULATION CONSULT NOTE Pharmacy Consult for Coumadin Indication: atrial fibrillation  Allergies  Allergen Reactions  . Cefuroxime Anaphylaxis and Other (See Comments)    Throat swelling  . Fish Allergy Shortness Of Breath    Pt said she has throat swelling   . Cephalexin Rash    Labs:  Recent Labs  11/17/16 1753 11/18/16 0031 11/18/16 0613 11/19/16 0258  HGB 11.8*  --  11.7*  --   HCT 37.4  --  36.8  --   PLT 288  --  261  --   LABPROT 14.3  --  14.9 14.4  INR 1.11  --  1.17 1.12  CREATININE 1.38*  --  1.48* 1.96*  TROPONINI  --  0.03* 0.04*  --     Estimated Creatinine Clearance: 19.5 mL/min (A) (by C-G formula based on SCr of 1.96 mg/dL (H)).   Assessment: 83yo female transported from PCP to ED for Afib w/ RVR, had been newly dx'd 71mo ago, to continue Coumadin and start LMWH bridge for low INR; on 7/25 pt had INR of 5.5 and PCP changed Coumadin from 3mg  daily to 3mg  every other day and held allopurinol, INR now close to baseline w/ last dose of Coumadin taken 8/9.  INR today = 1.12 No bleeding noted  Goal of Therapy:  INR 2-3   Plan:  Repeat warfarin 5 mg po x 1 tonight Continue Lovenox 70 mg sq Q 24 hours Follow up AM INR  Thank you , PharmD [phone]  11/19/2016,11:06 AM

## 2016-11-19 NOTE — Progress Notes (Signed)
Pt transferred to 3[address] bed 04 per bed with all personal belongings by Nurse techs - Daughter aware of transfer other family members here and took pt's personal rolator/walker  with them

## 2016-11-19 NOTE — Evaluation (Signed)
Physical Therapy Evaluation Patient Details Name: [name] MRN: [id number] DOB: Jan 28, 1930 Today's Date: 11/19/2016   History of Present Illness  [name] is a 83 y.o. female with medical history significant of pancreatitis, chronic anemia, osteoarthritis, RA, chronic atrial fibrillation, CAD, carotid artery disease, unspecified CHF, DVT, peripheral vascular disease, hypertension, GERD, history of GI bleed, history of mesenteric ischemia, hyperlipidemia, hypothyroidism who is coming to the emergency department referred by the [facility] clinic for weakness for the past 3 days and atrial fibrillation with RVR in the 120s and 130s  Clinical Impression  Patient presents with problems listed below.  Will benefit from acute PT to maximize functional independence prior to return to ILF.  Do not anticipate any f/u PT needs at d/c.    Follow Up Recommendations No PT follow up;Supervision - Intermittent    Equipment Recommendations  None recommended by PT    Recommendations for Other Services       Precautions / Restrictions Precautions Precautions: Fall Restrictions Weight Bearing Restrictions: No      Mobility  Bed Mobility               General bed mobility comments: Pt up in recliner upon my arrival  Transfers Overall transfer level: Needs assistance Equipment used: 4-wheeled walker Transfers: Sit to/from Stand Sit to Stand: Min guard         General transfer comment: Patient uses correct hand placement and technique.  Ambulation/Gait Ambulation/Gait assistance: Min guard Ambulation Distance (Feet): 170 Feet Assistive device: 4-wheeled walker Gait Pattern/deviations: Step-through pattern;Decreased step length - right;Decreased step length - left;Decreased stride length;Shuffle;Trunk flexed Gait velocity: decreased Gait velocity interpretation: Below normal speed for age/gender General Gait Details: Patient with slow, fairly steady gait with rollator.   Trunk flexed due to upper back aching.  Increased flexion with fatigue.  Cues to try to stand upright.  Patient with max HR of 108 during gait.  SaO2 remained 91-95%.  Stairs            Wheelchair Mobility    Modified Rankin (Stroke Patients Only)       Balance   Sitting-balance support: No upper extremity supported;Feet supported Sitting balance-Leahy Scale: Good     Standing balance support: No upper extremity supported Standing balance-Leahy Scale: Fair                               Pertinent Vitals/Pain Pain Assessment: Faces Faces Pain Scale: Hurts little more Pain Location: Mid-upper back Pain Descriptors / Indicators: Aching Pain Intervention(s): Monitored during session    Home Living Family/patient expects to be discharged to:: Private residence (ILF) Living Arrangements: Alone Available Help at Discharge: Family;Available PRN/intermittently Cabin crew;  Daughter assists frequently) Type of Home: Independent living facility Home Access: Level entry     Home Layout: One level Home Equipment: Walker - 4 wheels;Grab bars - toilet;Bedside commode;Shower seat - built in;Grab bars - tub/shower;Hand held shower head Additional Comments: Patient has an alert necklace for emergencies    Prior Function Level of Independence: Independent with assistive device(s)         Comments: Uses rollator for gait     Hand Dominance   Dominant Hand: Right    Extremity/Trunk Assessment   Upper Extremity Assessment Upper Extremity Assessment: Defer to OT evaluation    Lower Extremity Assessment Lower Extremity Assessment: Generalized weakness       Communication   Communication: No difficulties  Cognition Arousal/Alertness: Awake/alert Behavior During Therapy: WFL for tasks assessed/performed Overall Cognitive Status: Within Functional Limits for tasks assessed                                        General Comments       Exercises     Assessment/Plan    PT Assessment Patient needs continued PT services  PT Problem List Decreased strength;Decreased balance;Decreased mobility;Cardiopulmonary status limiting activity;Pain       PT Treatment Interventions DME instruction;Gait training;Functional mobility training;Therapeutic activities;Therapeutic exercise;Patient/family education    PT Goals (Current goals can be found in the Care Plan section)  Acute Rehab PT Goals Patient Stated Goal: Return home PT Goal Formulation: With patient Time For Goal Achievement: 11/26/16 Potential to Achieve Goals: Good    Frequency Min 3X/week   Barriers to discharge        Co-evaluation               AM-PAC PT "6 Clicks" Daily Activity  Outcome Measure Difficulty turning over in bed (including adjusting bedclothes, sheets and blankets)?: None Difficulty moving from lying on back to sitting on the side of the bed? : None Difficulty sitting down on and standing up from a chair with arms (e.g., wheelchair, bedside commode, etc,.)?: None Help needed moving to and from a bed to chair (including a wheelchair)?: A Little Help needed walking in hospital room?: A Little Help needed climbing 3-5 steps with a railing? : A Little 6 Click Score: 21    End of Session Equipment Utilized During Treatment: Gait belt Activity Tolerance: Patient tolerated treatment well;Patient limited by fatigue Patient left: in chair;with call bell/phone within reach;with chair alarm set Nurse Communication: Mobility status PT Visit Diagnosis: Unsteadiness on feet (R26.81);Muscle weakness (generalized) (M62.81)    Time: 0938-1829 PT Time Calculation (min) (ACUTE ONLY): 16 min   Charges:   PT Evaluation $PT Eval Moderate Complexity: 1 Mod     PT G Codes:        Durenda Hurt. Renaldo Fiddler, [facility] Acute Rehab Services Pager [phone]   Vena Austria 11/19/2016, 12:51 PM

## 2016-11-19 NOTE — Progress Notes (Signed)
PROGRESS NOTE  [name] ZTI:[id number] DOB: 11-Oct-1929 DOA: 11/17/2016 PCP: Rodolph Bong, MD  HPI/Recap of past 24 hours:  Continue to feel better, heart rate  Now is controlled off cardizem drip Denies pain, no fever, no chest pain, no edema  Assessment/Plan: Principal Problem:   Atrial fibrillation with RVR (HCC) Active Problems:   Hypothyroidism   Rheumatoid arthritis (HCC)   Diabetes mellitus with renal complications (HCC)   UTI (urinary tract infection)   Anemia   CAD (coronary artery disease)   Hyperlipidemia   Hypertension   Atrial fibrillation with RVR (HCC) CHADS-VASc Score of 7. tsh 3.6, k4.1, mag 2.3 echocardiogram with preserved lvef, not able to access diastolic function due to afib Patient had been hospitalized on 7/12 for the same, she had several other ED and pmd visit for the same since after recent hospitalization She has not seen a cardiology since she relocated back to [place] a year ago, Cardiology consulted, input appreciated. Continue home medsmetoprolol succinate 25 mg by mouth daily. She is now off cardizem drip, on po cardizem. Remain in afib/ rate controlled  Subtherapeutic INR: INR 1.1 on admission Lovenox SQ since INR on admission is subtherapeutic (1.1). Warfarin per pharmacy. INT today remain subtherapeutic  Chronic Diastolic CHF: euvolemic on exam, cxr no pulmonary edema Continue home dose diuretics Echo as mentioned above Cardiology consulted, input appreciated  CAD (coronary artery disease), h/o CABG Denies chest pain at this time. Continue aspirin, beta blocker and statin.  Hypertension Continue metoprolol XL 25 mg by mouth daily. Oral cardizem hold lisinopril today due to borderline bp and elevation of cr.  Diabetes mellitus with renal complications (HCC), likely secondary to chronic steroid use a1c in 07/2016 7.1, she is not on any diabetes meds at home, continue Carbohydrate modified diet. Repeat a1c pending, on  ssi here I have obtained and reviewed medical record from her podiatry , apperantly she had recent treatment for diabetic foot ulcer two weeks ago, on exam  no significant foot wound.  Hyperlipidemia Last lipid file was obtained in 2011,  repeat fasting lipid hdl low at 25, ldl 69, triglycerid 242 Continue pravastatin 40 mg by mouth daily.lft wnl  CKDIII/IV: Cr and gfr close to baseline Cr increased to 1.96 today, likely from brief hypotension yesterday,  hold lisinopril. Repeat bmp in am, continue Renal dosing meds  Anemia, likely anemia from chronic disease Iron studies in July last year were normal. Continue home iron supplementation. hgb stable at baseline 10-11  Rheumatoid arthritis (HCC)/ immunosuppressed state Continue Arava 10 mg by mouth daily and prednisone 5 mg by mouth daily. Report chronic neck pain and shoulder pain from RA, on prn analgesics.    UTI (urinary tract infection) Recently diagnosed by urgent care on 8/7, she is stated on macrobid , but she only started this for two days,  I have reviewed her past urine culture, in the past she has multiple uti with some of bacteria that is resistant to macrobid and ampicillin, she has reduced gfr, macrobid will not be a good choice  D/c macrobid, will give fosfomycinx 1dose. Repeat ua on admission looks clean but she has been on abx for two days, and she is immunosuppressed  Hypothyroidism tSH level 3.6 Continue levothyroxine  Possible early dementia, she report it is year 2017, but able to provide reasonable history.    DVT prophylaxis: On warfarin.  Code Status: code status verified, she reports has an established  DNR status  Family Communication: patient  Disposition Plan: transfer out of stepdown to med /tele. Start PT/OT, monitor renal function, INR , hopefully able to d/c home on 8/12   Consultants:  cardiology  Procedures:  none  Antibiotics:  none   Objective: BP (!) 113/59   Pulse  (!) 103   Temp 98 F (36.7 C) (Oral)   Resp (!) 21   Ht 5\' 2"  (1.575 m)   Wt 69.5 kg (153 lb 3.5 oz)   SpO2 99%   BMI 28.02 kg/m   Intake/Output Summary (Last 24 hours) at 11/19/16 1254 Last data filed at 11/19/16 0957  Gross per 24 hour  Intake           896.58 ml  Output              475 ml  Net           421.58 ml   Filed Weights   11/17/16 1727 11/18/16 0600  Weight: 70.3 kg (155 lb) 69.5 kg (153 lb 3.5 oz)    Exam: Patient is examined daily including today on 11/19/2016, exam remain the same as of yesterday except that is highlighted below   General:  Frail, NAD  Cardiovascular: IRRR, tachycardia has resolved  Respiratory: CTABL  Abdomen: Soft/ND/NT, positive BS  Musculoskeletal: No Edema  Neuro: slight impaired memory, report it is 2017, oriented to place and person, able to provide reasonable history.   Data Reviewed: Basic Metabolic Panel:  Recent Labs Lab 11/17/16 1753 11/18/16 0031 11/18/16 0613 11/19/16 0258  NA 139  --  139 134*  K 4.7  --  4.1 4.0  CL 102  --  102 99*  CO2 26  --  23 23  GLUCOSE 180*  --  135* 166*  BUN 21*  --  24* 40*  CREATININE 1.38*  --  1.48* 1.96*  CALCIUM 9.3  --  8.9 8.7*  MG  --  2.3  --   --    Liver Function Tests:  Recent Labs Lab 11/17/16 1753  AST 23  ALT 23  ALKPHOS 100  BILITOT 0.9  PROT 7.1  ALBUMIN 3.2*    Recent Labs Lab 11/17/16 1753  LIPASE 41   No results for input(s): AMMONIA in the last 168 hours. CBC:  Recent Labs Lab 11/17/16 1753 11/18/16 0613  WBC 8.7 9.8  NEUTROABS 5.6  --   HGB 11.8* 11.7*  HCT 37.4 36.8  MCV 88.4 87.8  PLT 288 261   Cardiac Enzymes:    Recent Labs Lab 11/18/16 0031 11/18/16 0613  TROPONINI 0.03* 0.04*   BNP (last 3 results)  Recent Labs  10/20/16 2132 11/17/16 1753  BNP 345.8* 174.2*    ProBNP (last 3 results) No results for input(s): PROBNP in the last 8760 hours.  CBG:  Recent Labs Lab 11/18/16 1214 11/18/16 1541  11/18/16 2115 11/19/16 0750 11/19/16 1200  GLUCAP 210* 252* 170* 179* 181*    Recent Results (from the past 240 hour(s))  Urine culture     Status: None   Collection Time: 11/15/16  2:58 PM  Result Value Ref Range Status   Culture KLEBSIELLA PNEUMONIAE  Final   Colony Count Greater than 100,000 CFU/mL  Final   Organism ID, Bacteria KLEBSIELLA PNEUMONIAE  Final      Susceptibility   Klebsiella pneumoniae -  (no method available)    AMPICILLIN  Resistant     AMOX/CLAVULANIC <=2 Sensitive     AMPICILLIN/SULBACTAM <=2 Sensitive     PIP/TAZO <=4  Sensitive     IMIPENEM <=0.25 Sensitive     CEFAZOLIN <=4 Not Reportable     CEFTRIAXONE <=1 Sensitive     CEFTAZIDIME <=1 Sensitive     CEFEPIME <=1 Sensitive     GENTAMICIN <=1 Sensitive     TOBRAMYCIN <=1 Sensitive     CIPROFLOXACIN <=0.25 Sensitive     LEVOFLOXACIN <=0.12 Sensitive     NITROFURANTOIN 32 Sensitive     TRIMETH/SULFA* <=20 Sensitive      * NR=NOT REPORTABLE,SEE COMMENTORAL therapy:A cefazolin MIC of <32 predicts susceptibility to the oral agents cefaclor,cefdinir,cefpodoxime,cefprozil,cefuroxime,cephalexin,and loracarbef when used for therapy of uncomplicated UTIs due to E.coli,K.pneumomiae,and P.mirabilis. PARENTERAL therapy: A cefazolinMIC of >8 indicates resistance to parenteralcefazolin. An alternate test method must beperformed to confirm susceptibility to parenteralcefazolin.  MRSA PCR Screening     Status: None   Collection Time: 11/18/16  5:54 AM  Result Value Ref Range Status   MRSA by PCR NEGATIVE NEGATIVE Final    Comment:        The GeneXpert MRSA Assay (FDA approved for NASAL specimens only), is one component of a comprehensive MRSA colonization surveillance program. It is not intended to diagnose MRSA infection nor to guide or monitor treatment for MRSA infections.      Studies: No results found.  Scheduled Meds: . aspirin EC  81 mg Oral Daily  . clonazePAM  0.5 mg Oral QHS  . diltiazem  240 mg  Oral Daily  . docusate sodium  100 mg Oral BID  . enoxaparin (LOVENOX) injection  1 mg/kg Subcutaneous Q24H  . insulin aspart  0-9 Units Subcutaneous TID WC  . iron polysaccharides  150 mg Oral Daily  . leflunomide  10 mg Oral QHS  . levothyroxine  50 mcg Oral QAC breakfast  . magnesium oxide  400 mg Oral BID  . metoprolol succinate  25 mg Oral Daily  . pantoprazole  40 mg Oral Daily  . pravastatin  40 mg Oral Daily  . predniSONE  5 mg Oral Q breakfast  . torsemide  50 mg Oral Daily  . warfarin  5 mg Oral ONCE-1800  . Warfarin - Pharmacist Dosing Inpatient   Does not apply q1800    Continuous Infusions:    Time spent: 35 mins  I have personally reviewed and interpreted daily labs, tele strips,ekg, imagings as discussed above under date review session and assessment and plans.  Jariah Jarmon MD, PhD  [facility] Pager [phone]. If 7PM-7AM, please contact night-coverage at [email or web address], password [facility] 11/19/2016, 12:54 PM  LOS: 2 days

## 2016-11-20 LAB — URINALYSIS, ROUTINE W REFLEX MICROSCOPIC
BILIRUBIN URINE: NEGATIVE
GLUCOSE, UA: NEGATIVE mg/dL
HGB URINE DIPSTICK: NEGATIVE
KETONES UR: NEGATIVE mg/dL
LEUKOCYTES UA: NEGATIVE
Nitrite: NEGATIVE
PROTEIN: NEGATIVE mg/dL
Specific Gravity, Urine: 1.005 (ref 1.005–1.030)
pH: 6 (ref 5.0–8.0)

## 2016-11-20 LAB — BASIC METABOLIC PANEL
ANION GAP: 11 (ref 5–15)
Anion gap: 12 (ref 5–15)
BUN: 46 mg/dL — AB (ref 6–20)
BUN: 41 mg/dL — AB (ref 6–20)
CALCIUM: 8.4 mg/dL — AB (ref 8.9–10.3)
CALCIUM: 8.8 mg/dL — AB (ref 8.9–10.3)
CO2: 19 mmol/L — ABNORMAL LOW (ref 22–32)
CO2: 24 mmol/L (ref 22–32)
CREATININE: 1.68 mg/dL — AB (ref 0.44–1.00)
Chloride: 101 mmol/L (ref 101–111)
Chloride: 103 mmol/L (ref 101–111)
Creatinine, Ser: 1.94 mg/dL — ABNORMAL HIGH (ref 0.44–1.00)
GFR calc Af Amer: 26 mL/min — ABNORMAL LOW (ref >60)
GFR calc Af Amer: 31 mL/min — ABNORMAL LOW (ref >60)
GFR, EST NON AFRICAN AMERICAN: 23 mL/min — AB (ref >60)
GFR, EST NON AFRICAN AMERICAN: 27 mL/min — AB (ref >60)
GLUCOSE: 126 mg/dL — AB (ref 65–99)
Glucose, Bld: 126 mg/dL — ABNORMAL HIGH (ref 65–99)
POTASSIUM: 5.5 mmol/L — AB (ref 3.5–5.1)
Potassium: 4.4 mmol/L (ref 3.5–5.1)
SODIUM: 132 mmol/L — AB (ref 135–145)
Sodium: 138 mmol/L (ref 135–145)

## 2016-11-20 LAB — GLUCOSE, CAPILLARY
GLUCOSE-CAPILLARY: 139 mg/dL — AB (ref 65–99)
Glucose-Capillary: 157 mg/dL — ABNORMAL HIGH (ref 65–99)

## 2016-11-20 LAB — PROTIME-INR
INR: 1.09
Prothrombin Time: 14.1 seconds (ref 11.4–15.2)

## 2016-11-20 LAB — HEMOGLOBIN A1C
HEMOGLOBIN A1C: 8.1 % — AB (ref 4.8–5.6)
Mean Plasma Glucose: 186 mg/dL

## 2016-11-20 MED ORDER — LISINOPRIL 5 MG PO TABS
5.0000 mg | ORAL_TABLET | Freq: Every day | ORAL | 0 refills | Status: DC
Start: 2016-11-20 — End: 2016-12-08

## 2016-11-20 MED ORDER — WARFARIN SODIUM 5 MG PO TABS: 6.0000 mg | ORAL_TABLET | Freq: Once | ORAL | Status: DC

## 2016-11-20 MED ORDER — WARFARIN SODIUM 3 MG PO TABS: 3.0000 mg | ORAL_TABLET | Freq: Every day | ORAL | 0 refills | Status: DC

## 2016-11-20 NOTE — Progress Notes (Signed)
ANTICOAGULATION CONSULT NOTE - Follow Up Consult  Pharmacy Consult for Coumadin Indication: atrial fibrillation  Allergies  Allergen Reactions  . Cefuroxime Anaphylaxis and Other (See Comments)    Throat swelling  . Fish Allergy Shortness Of Breath    Pt said she has throat swelling   . Cephalexin Rash    Patient Measurements: Height: 5\' 2"  (157.5 cm) Weight: 155 lb 14.4 oz (70.7 kg) IBW/kg (Calculated) : 50.1  Vital Signs: Temp: 98.5 F (36.9 C) (08/12 0502) BP: 141/123 (08/12 0820) Pulse Rate: 96 (08/12 0502)  Labs:  Recent Labs  11/17/16 1753 11/18/16 0031 11/18/16 01/18/17 11/19/16 0258 11/20/16 0201 11/20/16 0743  HGB 11.8*  --  11.7*  --   --   --   HCT 37.4  --  36.8  --   --   --   PLT 288  --  261  --   --   --   LABPROT 14.3  --  14.9 14.4 14.1  --   INR 1.11  --  1.17 1.12 1.09  --   CREATININE 1.38*  --  1.48* 1.96* 1.94* 1.68*  TROPONINI  --  0.03* 0.04*  --   --   --     Estimated Creatinine Clearance: 22.9 mL/min (A) (by C-G formula based on SCr of 1.68 mg/dL (H)).   Medications:  Scheduled:  . aspirin EC  81 mg Oral Daily  . clonazePAM  0.5 mg Oral QHS  . diltiazem  240 mg Oral Daily  . docusate sodium  100 mg Oral BID  . enoxaparin (LOVENOX) injection  1 mg/kg Subcutaneous Q24H  . insulin aspart  0-9 Units Subcutaneous TID WC  . iron polysaccharides  150 mg Oral Daily  . leflunomide  10 mg Oral QHS  . levothyroxine  50 mcg Oral QAC breakfast  . magnesium oxide  400 mg Oral BID  . metoprolol succinate  25 mg Oral Daily  . pantoprazole  40 mg Oral Daily  . pravastatin  40 mg Oral Daily  . predniSONE  5 mg Oral Q breakfast  . sodium chloride flush  3 mL Intravenous Q12H  . torsemide  50 mg Oral Daily  . Warfarin - Pharmacist Dosing Inpatient   Does not apply q1800    Assessment: 83yo female on Coumadin with Lovenox bridge for AFib/RVR.  Dose had recently been adjusted as outpt for high INR and presented to ED with INR 1.11 on 8/9.  INR  1.09 today.  No bleeding noted.  Goal of Therapy:  INR 2-3 Monitor platelets by anticoagulation protocol: Yes   Plan:  Continue Lovenox 70mg  SQ q24 Coumadin 6mg  po today Daily INR Watch for s/s of bleeding   10/9, B.S., PharmD Clinical Pharmacist [facility]

## 2016-11-20 NOTE — Care Management Note (Signed)
Case Management Note  Patient Details  Name: [name] MRN: [id number] Date of Birth: 08/18/1929  Subjective/Objective:  83 y.o. F to return to IL at [place] with [facility] and CSW. Vaughan Basta has accepted referral and is aware to speak with daughter, [name] @ [phone].                   Action/Plan:CM will sign off for now but will be available should additional discharge needs arise or disposition change.    Expected Discharge Date:  11/20/16               Expected Discharge Plan:  Home/Self Care  In-House Referral:  NA  Discharge planning Services  CM Consult  Post Acute Care Choice:  NA, Home Health Choice offered to:  Adult Children, Patient [name] [phone])  DME Arranged:    DME Agency:     HH Arranged:  RN, Social Work [employer] Agency:  [facility]  Status of Service:  Completed, signed off  If discussed at [employer], dates discussed:    Additional Comments:  Yvone Neu, RN 11/20/2016, 3:32 PM

## 2016-11-20 NOTE — Discharge Summary (Signed)
Discharge Summary  [name] GNF:[id number] DOB: 17-May-1929  PCP: Rodolph Bong, MD  Admit date: 11/17/2016 Discharge date: 11/20/2016  Time spent: >69mins, more than 50% time spent on coordination of care, patient/family education and counseling.   Recommendations for Outpatient Follow-up:  1. F/u with PMD on 8/14 for INR check and hospital discharge follow up, repeat cbc/bmp at follow up 2. F/u with cardiology for afib/chf management. 3. PMD to refer patient to nephrology for CKDIV.   Discharge Diagnoses:  Active Hospital Problems   Diagnosis Date Noted  . Atrial fibrillation with RVR (HCC) 10/20/2016  . Anemia 11/17/2016  . CAD (coronary artery disease) 11/17/2016  . Hyperlipidemia 11/17/2016  . Hypertension 11/17/2016  . UTI (urinary tract infection) 04/01/2015  . Diabetes mellitus with renal complications (HCC) 03/03/2015  . Rheumatoid arthritis (HCC) 02/18/2015  . Hypothyroidism 06/26/2009    Resolved Hospital Problems   Diagnosis Date Noted Date Resolved  No resolved problems to display.    Discharge Condition: stable  Diet recommendation: heart healthy/carb modified  Filed Weights   11/17/16 1727 11/18/16 0600 11/20/16 0120  Weight: 70.3 kg (155 lb) 69.5 kg (153 lb 3.5 oz) 70.7 kg (155 lb 14.4 oz)    History of present illness:  PCP: Rodolph Bong, MD   Patient coming from: PCP.  I have personally briefly reviewed patient's old medical records in [facility]  Chief Complaint: Weakness atrial fibrillation.  HPI: [name] is a 83 y.o. female with medical history significant of pancreatitis, chronic anemia, osteoarthritis, chronic atrial fibrillation, CAD, carotid artery disease, unspecified CHF, DVT, peripheral vascular disease, hypertension, GERD, history of GI bleed, history of mesenteric ischemia, hyperlipidemia, hypothyroidism who is coming to the emergency department referred by the [facility] clinic for weakness for the past 3 days  and atrial fibrillation with RVR in the 120s and 130s. She is currently being treated for UTI with nitrofurantoin 100 mg by mouth twice a day after presenting to the [facility] [place] with weakness, confusion and dysuria 2 days ago. She complains of mild dyspnea, palpitations and orthopnea. She denies fever, chills, sore throat, chest pain, diaphoresis, PND, abdominal pain, nausea, emesis, diarrhea, melena, hematochezia, current dysuria, polyuria, polydipsia or blurred vision.  ED Course: Initial vital signs temperature 98.17F, pulse 121, blood pressure 155/101 mmHg, respirations 13 and O2 sat 99%. The patient was started on a Cardizem infusion. Her workup shows normal urinalysis. Her EKG shows atrial fibrillation with RVR, the PVCs and LVH. Please see tracing for further detail. Troponin level was normal. Her BNP was 174.2 pg/mL. PT was 14.3 and INR 1.11. WBC 8.7, hemoglobin 11.8 g/dL and platelets 846. Lactic acid and electrolytes were normal. Lipase level was normal. Her chest radiograph shows stable cardiomegaly.  Hospital Course:  Principal Problem:   Atrial fibrillation with RVR (HCC) Active Problems:   Hypothyroidism   Rheumatoid arthritis (HCC)   Diabetes mellitus with renal complications (HCC)   UTI (urinary tract infection)   Anemia   CAD (coronary artery disease)   Hyperlipidemia   Hypertension   Atrial fibrillation with RVR (HCC) CHADS-VASc Scoreof 7. tsh 3.6, k4.1, mag 2.3 echocardiogram with preserved lvef, not able to access diastolic function due to afib Patient had been hospitalized on 7/12 for the same, she had several other ED and pmd visit for the same since after recent hospitalization She has not seen a cardiology since she relocated back to [place] two years ago, Cardiology consulted, input appreciated. She required cardizem drip initially,  now heart rate controlled on oral cardiazem and Continue home medsmetoprolol succinate 25 mg by mouth daily. Remain in  afib/ rate controlled She is to follow with cardiology closely   Subtherapeutic INR: INR 1.1 on admission Lovenox SQ since INR on admission is subtherapeutic (1.1). Warfarin per pharmacy. She received hight coumadin dose in the hospital 5mg  on 8/10 and on 8/11, she is discharged on coumadin 3mg  daily, she is to have INR checked on 8/14, further coumadin dose adjustment per pmd.   Chronic Diastolic CHF: euvolemic on exam, cxr no pulmonary edema, no lower extremity edema Continue home dose diuretics Echo as mentioned above Cardiology consulted, input appreciated, she prefers to follow with Dr [name] .  CAD (coronary artery disease), h/o CABG Denies chest pain at this time. Continue aspirin, beta blocker and statin.  Hypertension Continue home dose  metoprolol XL 25 mg by mouth daily. And Oral cardizem hold lisinopril today due to borderline bp and elevation of cr. Lisinopril does reduced at discharge due to elevated cr .  Diabetes mellitus with renal complications (HCC), likely secondary to chronic steroid use a1c in 07/2016 7.1, she is not on any diabetes meds at home,  Repeat a1c at 8.1,  apperantly she had recent treatment for diabetic foot ulcer two weeks ago, on exam  no significant foot wound. pmd to close follow up for blood sugar control, consider meds treatment if blood sugar not improving with strict diet control.  Hyperlipidemia Last lipid file was obtained in 2011,  repeat fasting lipid hdl low at 25, ldl 69, triglycerid 242 Continue pravastatin 40 mg by mouth daily.lft wnl  AKI on CKDIII/IV: Cr and gfr close to baseline Cr peaked at  1.96 on 8/11, likely from brief hypotension yesterday,  hold lisinopril.  Repeat bmp prior to discharge cr down to 1.68.  continue Renal dosing meds Home meds lisinopril resumed at discharge at reduced dose (from 20mg  daily to 5mg  daily at discharge. ) Discontinue daily mag supplement in the setting of ckd. Home dose torsemide  resumed at discharge.  Patient is advised to follow with nephrology on outpatient basis.  Anemia, likely anemia from chronic disease Iron studies in July last year were normal. Continue home iron supplementation. hgb stable at baseline 10-11  Rheumatoid arthritis (HCC)/ immunosuppressed state Continue Arava10 mg by mouth daily and prednisone 5 mg by mouth daily. Report chronic neck pain and shoulder pain from RA, on prn analgesics.   UTI (urinary tract infection) Recently diagnosed by urgent care on 8/7, she is stated on macrobid , but she only started this for two days,  I have reviewed her past urine culture, in the past she has multiple uti with some of bacteria that is resistant to macrobid and ampicillin, she has reduced gfr, macrobid will not be a good choice  D/c macrobid, she received fosfomycinx 1dose. Repeat ua on admission looks clean but she has been on abx for two days, and she is immunosuppressed. Repeat ua at discharge due to c/o urinary frequency, ua unremarkable on repeat testing.   Hypothyroidism tSH level 3.6 Continue levothyroxine  Possible early dementia, she report it is year 2017, but able to provide reasonable history. Family verified that patient has some progressive memory decline in the last 2-3 yrs. Advised outpatient dementia eval.    DVT prophylaxis:On warfarin.  Code Status: code status verified, she reports has an established  DNR status  Family Communication: patient and multiple family member at home   Disposition Plan:  d/c  home (independent living) with home health RN on 8/12   Consultants:  cardiology  Procedures:  none  Antibiotics:  none  Discharge Exam: BP (!) 141/123   Pulse 96   Temp 98.5 F (36.9 C)   Resp 19   Ht 5\' 2"  (1.575 m)   Wt 70.7 kg (155 lb 14.4 oz)   SpO2 96%   BMI 28.51 kg/m   General: NAD Cardiovascular: IRRR Respiratory: CTABL Extremity: no edema Neuro: not oriented to year,  likely baseline  Discharge Instructions You were cared for by a hospitalist during your hospital stay. If you have any questions about your discharge medications or the care you received while you were in the hospital after you are discharged, you can call the unit and asked to speak with the hospitalist on call if the hospitalist that took care of you is not available. Once you are discharged, your primary care physician will handle any further medical issues. Please note that NO REFILLS for any discharge medications will be authorized once you are discharged, as it is imperative that you return to your primary care physician (or establish a relationship with a primary care physician if you do not have one) for your aftercare needs so that they can reassess your need for medications and monitor your lab values.  Discharge Instructions    Diet - low sodium heart healthy    Complete by:  As directed    Face-to-face encounter (required for Medicare/Medicaid patients)    Complete by:  As directed    I Shatiqua Heroux certify that this patient is under my care and that I, or a nurse practitioner or physician's assistant working with me, had a face-to-face encounter that meets the physician face-to-face encounter requirements with this patient on 11/20/2016. The encounter with the patient was in whole, or in part for the following medical condition(s) which is the primary reason for home health care (List medical condition): FTT, memory deficit, heart failure management, medication supervision.   The encounter with the patient was in whole, or in part, for the following medical condition, which is the primary reason for home health care:  FTT   I certify that, based on my findings, the following services are medically necessary home health services:  Nursing   Reason for Medically Necessary Home Health Services:  Skilled Nursing- Change/Decline in Patient Status   My clinical findings support the need for the above  services:  Shortness of breath with activity   Further, I certify that my clinical findings support that this patient is homebound due to:  Mental confusion   Home Health    Complete by:  As directed    To provide the following care/treatments:   RN Social work     Increase activity slowly    Complete by:  As directed      Allergies as of 11/20/2016      Reactions   Cefuroxime Anaphylaxis, Other (See Comments)   Throat swelling   Fish Allergy Shortness Of Breath   Pt said she has throat swelling    Cephalexin Rash      Medication List    STOP taking these medications   magnesium oxide 400 (241.3 Mg) MG tablet Commonly known as:  MAG-OX   nitrofurantoin (macrocrystal-monohydrate) 100 MG capsule Commonly known as:  MACROBID     TAKE these medications   alendronate 70 MG tablet Commonly known as:  FOSAMAX Take 70 mg by mouth every Sunday.   aspirin 81 MG  EC tablet TAKE ONE TABLET BY MOUTH EVERY DAY What changed:  See the new instructions.   Calcium Carb-Cholecalciferol 600-800 MG-UNIT Tabs TAKE ONE TABLET BY MOUTH 2 TIMES A DAY   clonazePAM 0.5 MG tablet Commonly known as:  KLONOPIN TAKE 1 TABLET BY MOUTH AT BEDTIME What changed:  See the new instructions.   diltiazem 240 MG 24 hr capsule Commonly known as:  CARDIZEM CD Take 1 capsule (240 mg total) by mouth at bedtime.   FERREX 150 150 MG capsule Generic drug:  iron polysaccharides TAKE ONE CAPSULE BY MOUTH EVERY DAY What changed:  See the new instructions.   glucose blood test strip Once daily E11.29   leflunomide 10 MG tablet Commonly known as:  ARAVA TAKE 1 TABLET BY MOUTH AT BEDTIME What changed:  See the new instructions.   levothyroxine 50 MCG tablet Commonly known as:  SYNTHROID, LEVOTHROID Take 50 mcg by mouth daily.   lisinopril 5 MG tablet Commonly known as:  PRINIVIL,ZESTRIL Take 1 tablet (5 mg total) by mouth daily. What changed:  medication strength  See the new instructions.     metoprolol succinate 25 MG 24 hr tablet Commonly known as:  TOPROL-XL TAKE 1 TABLET BY MOUTH ONCE DAILY What changed:  See the new instructions.   multivitamin with minerals Tabs tablet Take 1 tablet by mouth daily.   pantoprazole 40 MG tablet Commonly known as:  PROTONIX TAKE 1 TABLET BY MOUTH ONCE DAILY What changed:  See the new instructions.   pravastatin 40 MG tablet Commonly known as:  PRAVACHOL TAKE 1 TABLET BY MOUTH ONCE DAILY What changed:  See the new instructions.   predniSONE 5 MG tablet Commonly known as:  DELTASONE Take 1 or 2 tablets by mouth two times a day as needed for rheumatoid flair  Due for follow up visit   STOOL SOFTENER 100 MG capsule Generic drug:  docusate sodium TAKE 1 CAPSULE BY MOUTH 2 TIMES A DAY (AM & PM) What changed:  See the new instructions.   torsemide 100 MG tablet Commonly known as:  DEMADEX TAKE 1/2 TABLET BY MOUTH ONCE DAILY What changed:  See the new instructions.   traMADol 50 MG tablet Commonly known as:  ULTRAM TAKE 1 TABLET BY MOUTH TWICE A DAY TAKE 1 TABLET BY MOUTH TWICE A DAYAS NEEDED FOR PAIN What changed:  See the new instructions.   vitamin B-12 1000 MCG tablet Commonly known as:  CYANOCOBALAMIN Take 1,000 mcg by mouth daily.   Vitamin D2 2000 units Tabs Take 2,000 Units by mouth daily.   warfarin 3 MG tablet Commonly known as:  COUMADIN Take 1 tablet (3 mg total) by mouth daily at 6 PM. Check INR on 8/14, further dose adjustment per pmd. What changed:  how much to take  how to take this  when to take this  additional instructions      Allergies  Allergen Reactions  . Cefuroxime Anaphylaxis and Other (See Comments)    Throat swelling  . Fish Allergy Shortness Of Breath    Pt said she has throat swelling   . Cephalexin Rash   Follow-up Information    Lewayne Bunting, MD Follow up in 2 week(s).   Specialty:  Cardiology Why:  hospital discharge follow up for afib and heart failure  managment. Contact information: [address] [place] [address] [phone]        Rodolph Bong, MD Follow up on 11/22/2016.   Specialties:  Family Medicine, Emergency Medicine Why:  hospital discharge follow up and coumadine (INR) level check pmd to refer to nephrology for chronic kidney disease. Contact information: [address] [place] [address] [phone]            The results of significant diagnostics from this hospitalization (including imaging, microbiology, ancillary and laboratory) are listed below for reference.    Significant Diagnostic Studies: Dg Chest 2 View  Result Date: 11/17/2016 CLINICAL DATA:  Cough, palpitations, and weakness for 5 days. Coronary artery disease. EXAM: CHEST  2 VIEW COMPARISON:  11/02/2016 FINDINGS: Stable mild cardiomegaly and prior CABG. Both lungs are clear. No evidence of pneumothorax or pleural effusion. Aortic atherosclerosis. IMPRESSION: Stable mild cardiomegaly.  No active lung disease. Electronically Signed   By: Myles Rosenthal M.D.   On: 11/17/2016 18:47   Dg Chest 2 View  Result Date: 11/02/2016 CLINICAL DATA:  Right-sided chest pain radiating to the scapula for the past week. History of hypertension, diabetes, DVT, CHF and CABG. EXAM: CHEST  2 VIEW COMPARISON:  10/20/2016; 07/21/2014; 10/22/2010 FINDINGS: Grossly unchanged enlarged cardiac silhouette and mediastinal contours post median sternotomy and CABG. Atherosclerotic plaque within the thoracic aorta. Pulmonary vasculature appears less distinct with cephalization of flow. Chronic elevation of the left hemidiaphragm with bilateral infrahilar opacities, left greater than right. No pleural effusion or pneumothorax. No acute osseus abnormalities. IMPRESSION: 1. Cardiomegaly with findings suggestive of mild pulmonary edema. 2.  Aortic Atherosclerosis (ICD10-I70.0). Electronically Signed   By: Simonne Come M.D.   On: 11/02/2016 16:15     Microbiology: Recent Results (from the past 240 hour(s))  Urine culture     Status: None   Collection Time: 11/15/16  2:58 PM  Result Value Ref Range Status   Culture KLEBSIELLA PNEUMONIAE  Final   Colony Count Greater than 100,000 CFU/mL  Final   Organism ID, Bacteria KLEBSIELLA PNEUMONIAE  Final      Susceptibility   Klebsiella pneumoniae -  (no method available)    AMPICILLIN  Resistant     AMOX/CLAVULANIC <=2 Sensitive     AMPICILLIN/SULBACTAM <=2 Sensitive     PIP/TAZO <=4 Sensitive     IMIPENEM <=0.25 Sensitive     CEFAZOLIN <=4 Not Reportable     CEFTRIAXONE <=1 Sensitive     CEFTAZIDIME <=1 Sensitive     CEFEPIME <=1 Sensitive     GENTAMICIN <=1 Sensitive     TOBRAMYCIN <=1 Sensitive     CIPROFLOXACIN <=0.25 Sensitive     LEVOFLOXACIN <=0.12 Sensitive     NITROFURANTOIN 32 Sensitive     TRIMETH/SULFA* <=20 Sensitive      * NR=NOT REPORTABLE,SEE COMMENTORAL therapy:A cefazolin MIC of <32 predicts susceptibility to the oral agents cefaclor,cefdinir,cefpodoxime,cefprozil,cefuroxime,cephalexin,and loracarbef when used for therapy of uncomplicated UTIs due to E.coli,K.pneumomiae,and P.mirabilis. PARENTERAL therapy: A cefazolinMIC of >8 indicates resistance to parenteralcefazolin. An alternate test method must beperformed to confirm susceptibility to parenteralcefazolin.  MRSA PCR Screening     Status: None   Collection Time: 11/18/16  5:54 AM  Result Value Ref Range Status   MRSA by PCR NEGATIVE NEGATIVE Final    Comment:        The GeneXpert MRSA Assay (FDA approved for NASAL specimens only), is one component of a comprehensive MRSA colonization surveillance program. It is not intended to diagnose MRSA infection nor to guide or monitor treatment for MRSA infections.      Labs: Basic Metabolic Panel:  Recent Labs Lab 11/17/16 1753 11/18/16 0031 11/18/16  2119 11/19/16 0258 11/20/16 0201 11/20/16 0743  NA 139  --  139 134* 132* 138  K 4.7  --  4.1 4.0  5.5* 4.4  CL 102  --  102 99* 101 103  CO2 26  --  23 23 19* 24  GLUCOSE 180*  --  135* 166* 126* 126*  BUN 21*  --  24* 40* 46* 41*  CREATININE 1.38*  --  1.48* 1.96* 1.94* 1.68*  CALCIUM 9.3  --  8.9 8.7* 8.4* 8.8*  MG  --  2.3  --   --   --   --    Liver Function Tests:  Recent Labs Lab 11/17/16 1753  AST 23  ALT 23  ALKPHOS 100  BILITOT 0.9  PROT 7.1  ALBUMIN 3.2*    Recent Labs Lab 11/17/16 1753  LIPASE 41   No results for input(s): AMMONIA in the last 168 hours. CBC:  Recent Labs Lab 11/17/16 1753 11/18/16 0613  WBC 8.7 9.8  NEUTROABS 5.6  --   HGB 11.8* 11.7*  HCT 37.4 36.8  MCV 88.4 87.8  PLT 288 261   Cardiac Enzymes:  Recent Labs Lab 11/18/16 0031 11/18/16 0613  TROPONINI 0.03* 0.04*   BNP: BNP (last 3 results)  Recent Labs  10/20/16 2132 11/17/16 1753  BNP 345.8* 174.2*    ProBNP (last 3 results) No results for input(s): PROBNP in the last 8760 hours.  CBG:  Recent Labs Lab 11/19/16 1200 11/19/16 1600 11/19/16 2104 11/20/16 0707 11/20/16 1111  GLUCAP 181* 187* 149* 139* 157*       Signed:  Arlie Riker MD, PhD  [facility] 11/20/2016, 2:16 PM

## 2016-11-21 ENCOUNTER — Telehealth: Payer: Self-pay | Admitting: Family Medicine

## 2016-11-21 NOTE — Telephone Encounter (Signed)
Pt's daughter called advised pt just got out of [facility] yesterday and she would like for pt to have coumadin check once a month, urine checked once a month or every two months so they can keep track of her Uti's. Pt's daughter advised she will be making an appointment with the Urologist soon and if you can [facility]. Thanks

## 2016-11-21 NOTE — Telephone Encounter (Signed)
Pt is scheduled with you for tomorrow at 2pm. Thanks

## 2016-11-21 NOTE — Telephone Encounter (Signed)
Please have patient make an appointment with me soon.

## 2016-11-22 ENCOUNTER — Encounter: Payer: Self-pay | Admitting: Family Medicine

## 2016-11-22 ENCOUNTER — Ambulatory Visit (INDEPENDENT_AMBULATORY_CARE_PROVIDER_SITE_OTHER): Payer: Medicare PPO | Admitting: Family Medicine

## 2016-11-22 VITALS — BP 109/67 | HR 100 | Wt 152.0 lb

## 2016-11-22 DIAGNOSIS — I10 Essential (primary) hypertension: Secondary | ICD-10-CM | POA: Diagnosis not present

## 2016-11-22 DIAGNOSIS — I739 Peripheral vascular disease, unspecified: Secondary | ICD-10-CM | POA: Diagnosis not present

## 2016-11-22 DIAGNOSIS — I4891 Unspecified atrial fibrillation: Secondary | ICD-10-CM | POA: Diagnosis not present

## 2016-11-22 DIAGNOSIS — Z7901 Long term (current) use of anticoagulants: Secondary | ICD-10-CM | POA: Diagnosis not present

## 2016-11-22 DIAGNOSIS — I701 Atherosclerosis of renal artery: Secondary | ICD-10-CM | POA: Diagnosis not present

## 2016-11-22 NOTE — Patient Instructions (Addendum)
Thank you for coming in today. Get labs today.  Recheck with me in about 1 week.  Return sooner if needed.  No change in medicines.   Continue warfarin dose.

## 2016-11-22 NOTE — Progress Notes (Signed)
[name] is a 83 y.o. female who presents to [facility] today for hospital follow-up. Patient was admitted to the hospital on August 9. She was discharged on the 12th. She was seen for atrial fibrillation with rapid ventricular rate complicating her diastolic dysfunction heart failure. Since she was discharged from the hospital she's feeling a lot better. She notes continued fatigue however denies any significant shortness of breath or urinary symptoms. Her INR was a bit low during the hospitalization. She has restarted warfarin recently. She is due for recheck today.   Past Medical History:  Diagnosis Date  . Acute pancreatitis   . Anemia   . Arthritis   . Atrial fibrillation ([place])   . CAD (coronary artery disease)   . Carotid artery occlusion   . CHF (congestive heart failure) ([place])   . Diabetes mellitus age 86  . DJD (degenerative joint disease)   . DVT (deep venous thrombosis) ([place])   . GERD (gastroesophageal reflux disease)   . GI bleed   . Hyperlipidemia   . Hypertension   . Joint pain   . Mesenteric ischemia   . Peripheral vascular disease ([place])   . Thyroid disease    Past Surgical History:  Procedure Laterality Date  . APPENDECTOMY    . CELIAC ARTERY STENT    . CORONARY ARTERY BYPASS GRAFT    . EMBOLECTOMY  2009   right leg  . FASCIOTOMY     right leg  . HIP FRACTURE SURGERY Left   . JOINT REPLACEMENT    . TOTAL KNEE ARTHROPLASTY     bilateral  . TUBAL LIGATION    . VISCERAL ANGIOGRAM N/A 05/31/2013   Procedure: MESSENTERIC Cyril Loosen;  Surgeon: Elam Dutch, MD;  Location: [facility] CATH LAB;  Service: Cardiovascular;  Laterality: N/A;   Social History  Substance Use Topics  . Smoking status: Never Smoker  . Smokeless tobacco: Never Used  . Alcohol use No   family history includes Cancer in her daughter and mother; Coronary artery  disease in her sister; Diabetes in her daughter, sister, and son; Heart disease in her sister; Hyperlipidemia in her son; Hypertension in her daughter and son; Other in her father and sister.  ROS as above:  Medications: Current Outpatient Prescriptions  Medication Sig Dispense Refill  . alendronate (FOSAMAX) 70 MG tablet Take 70 mg by mouth every Sunday.     Marland Kitchen aspirin 81 MG EC tablet TAKE ONE TABLET BY MOUTH EVERY DAY (Patient taking differently: TAKE 81 MG BY MOUTH EVERY DAY AT BEDTIME) 28 tablet 2  . Calcium Carb-Cholecalciferol 600-800 MG-UNIT TABS TAKE ONE TABLET BY MOUTH 2 TIMES A DAY 60 tablet 6  . clonazePAM (KLONOPIN) 0.5 MG tablet TAKE 1 TABLET BY MOUTH AT BEDTIME (Patient taking differently: TAKE 0.5 MG BY MOUTH AT BEDTIME) 14 tablet 0  . diltiazem (CARDIZEM CD) 240 MG 24 hr capsule Take 1 capsule (240 mg total) by mouth at bedtime. 90 capsule 1  . Ergocalciferol (VITAMIN D2) 2000 units TABS Take 2,000 Units by mouth daily.     Marland Kitchen FERREX 150 150 MG capsule TAKE ONE CAPSULE BY MOUTH EVERY DAY (Patient taking differently: TAKE 150 MG BY MOUTH EVERY DAY) 28 capsule 2  . glucose blood test strip Once daily E11.29 100 each 12  . leflunomide (ARAVA) 10 MG tablet TAKE 1 TABLET BY MOUTH AT BEDTIME (Patient taking differently: TAKE 10 MG BY MOUTH AT BEDTIME)  14 tablet 4  . levothyroxine (SYNTHROID, LEVOTHROID) 50 MCG tablet Take 50 mcg by mouth daily.      Marland Kitchen lisinopril (PRINIVIL,ZESTRIL) 5 MG tablet Take 1 tablet (5 mg total) by mouth daily. 30 tablet 0  . metoprolol succinate (TOPROL-XL) 25 MG 24 hr tablet TAKE 1 TABLET BY MOUTH ONCE DAILY (Patient taking differently: TAKE 25 MG BY MOUTH ONCE DAILY) 14 tablet 23  . Multiple Vitamin (MULTIVITAMIN WITH MINERALS) TABS tablet Take 1 tablet by mouth daily.    . pantoprazole (PROTONIX) 40 MG tablet TAKE 1 TABLET BY MOUTH ONCE DAILY (Patient taking differently: TAKE 40 MG BY MOUTH ONCE DAILY) 28 tablet 10  . pravastatin (PRAVACHOL) 40 MG tablet TAKE  1 TABLET BY MOUTH ONCE DAILY (Patient taking differently: TAKE 40 MG BY MOUTH ONCE DAILY) 14 tablet 23  . predniSONE (DELTASONE) 5 MG tablet Take 1 or 2 tablets by mouth two times a day as needed for rheumatoid flair  Due for follow up visit 30 tablet 0  . STOOL SOFTENER 100 MG capsule TAKE 1 CAPSULE BY MOUTH 2 TIMES A DAY (AM & PM) (Patient taking differently: TAKE 100 MG BY MOUTH 2 TIMES A DAY (AM & PM)) 56 capsule 0  . torsemide (DEMADEX) 100 MG tablet TAKE 1/2 TABLET BY MOUTH ONCE DAILY (Patient taking differently: TAKE 50 MG BY MOUTH ONCE DAILY) 7 tablet 23  . traMADol (ULTRAM) 50 MG tablet TAKE 1 TABLET BY MOUTH TWICE A DAY TAKE 1 TABLET BY MOUTH TWICE A DAYAS NEEDED FOR PAIN (Patient taking differently: Take 50 mg by mouth twice daily as needed for pain) 56 tablet 4  . vitamin B-12 (CYANOCOBALAMIN) 1000 MCG tablet Take 1,000 mcg by mouth daily.    Marland Kitchen warfarin (COUMADIN) 3 MG tablet Take 1 tablet (3 mg total) by mouth daily at 6 PM. Check INR on 8/14, further dose adjustment per pmd. 30 tablet 0   No current facility-administered medications for this visit.    Allergies  Allergen Reactions  . Cefuroxime Anaphylaxis and Other (See Comments)    Throat swelling  . Fish Allergy Shortness Of Breath    Pt said she has throat swelling   . Cephalexin Rash    Health Maintenance Health Maintenance  Topic Date Due  . OPHTHALMOLOGY EXAM  07/13/2016  . INFLUENZA VACCINE  12/29/2016 (Originally 11/09/2016)  . HEMOGLOBIN A1C  05/22/2017  . FOOT EXAM  07/26/2017  . TETANUS/TDAP  12/28/2025  . PNA vac Low Risk Adult  Completed     Exam:  BP 109/67   Pulse 100   Wt 152 lb (68.9 kg)   SpO2 96%   BMI 27.80 kg/m  Gen: Well NAD HEENT: EOMI,  MMM Lungs: Normal work of breathing. CTABL Heart: Irregular. Rate less than 100 bpm no MRG Abd: NABS, Soft. Nondistended, Nontender Exts: Brisk capillary refill, warm and well perfused. No edema   Results for orders placed or performed during the  hospital encounter of 11/17/16 (from the past 72 hour(s))  Glucose, capillary     Status: Abnormal   Collection Time: 11/19/16  4:00 PM  Result Value Ref Range   Glucose-Capillary 187 (H) 65 - 99 mg/dL  Glucose, capillary     Status: Abnormal   Collection Time: 11/19/16  9:04 PM  Result Value Ref Range   Glucose-Capillary 149 (H) 65 - 99 mg/dL  Protime-INR     Status: None   Collection Time: 11/20/16  2:01 AM  Result Value Ref Range  Prothrombin Time 14.1 11.4 - 15.2 seconds   INR 0.99   Basic metabolic panel     Status: Abnormal   Collection Time: 11/20/16  2:01 AM  Result Value Ref Range   Sodium 132 (L) 135 - 145 mmol/L   Potassium 5.5 (H) 3.5 - 5.1 mmol/L    Comment: SLIGHT HEMOLYSIS DELTA CHECK NOTED    Chloride 101 101 - 111 mmol/L   CO2 19 (L) 22 - 32 mmol/L   Glucose, Bld 126 (H) 65 - 99 mg/dL   BUN 46 (H) 6 - 20 mg/dL   Creatinine, Ser 1.94 (H) 0.44 - 1.00 mg/dL   Calcium 8.4 (L) 8.9 - 10.3 mg/dL   GFR calc non Af Amer 23 (L) >60 mL/min   GFR calc Af Amer 26 (L) >60 mL/min    Comment: (NOTE) The eGFR has been calculated using the CKD EPI equation. This calculation has not been validated in all clinical situations. eGFR's persistently <60 mL/min signify possible Chronic Kidney Disease.    Anion gap 12 5 - 15  Glucose, capillary     Status: Abnormal   Collection Time: 11/20/16  7:07 AM  Result Value Ref Range   Glucose-Capillary 139 (H) 65 - 99 mg/dL  Basic metabolic panel     Status: Abnormal   Collection Time: 11/20/16  7:43 AM  Result Value Ref Range   Sodium 138 135 - 145 mmol/L   Potassium 4.4 3.5 - 5.1 mmol/L   Chloride 103 101 - 111 mmol/L   CO2 24 22 - 32 mmol/L   Glucose, Bld 126 (H) 65 - 99 mg/dL   BUN 41 (H) 6 - 20 mg/dL   Creatinine, Ser 1.68 (H) 0.44 - 1.00 mg/dL   Calcium 8.8 (L) 8.9 - 10.3 mg/dL   GFR calc non Af Amer 27 (L) >60 mL/min   GFR calc Af Amer 31 (L) >60 mL/min    Comment: (NOTE) The eGFR has been calculated using the CKD EPI  equation. This calculation has not been validated in all clinical situations. eGFR's persistently <60 mL/min signify possible Chronic Kidney Disease.    Anion gap 11 5 - 15  Glucose, capillary     Status: Abnormal   Collection Time: 11/20/16 11:11 AM  Result Value Ref Range   Glucose-Capillary 157 (H) 65 - 99 mg/dL  Urinalysis, Routine w reflex microscopic     Status: Abnormal   Collection Time: 11/20/16 11:25 AM  Result Value Ref Range   Color, Urine STRAW (A) YELLOW   APPearance CLEAR CLEAR   Specific Gravity, Urine 1.005 1.005 - 1.030   pH 6.0 5.0 - 8.0   Glucose, UA NEGATIVE NEGATIVE mg/dL   Hgb urine dipstick NEGATIVE NEGATIVE   Bilirubin Urine NEGATIVE NEGATIVE   Ketones, ur NEGATIVE NEGATIVE mg/dL   Protein, ur NEGATIVE NEGATIVE mg/dL   Nitrite NEGATIVE NEGATIVE   Leukocytes, UA NEGATIVE NEGATIVE   No results found.    Assessment and Plan: 83 y.o. female with Hospital follow-up.  Improved ventricular rate with good control today. Dyspnea is improved. Plan to recheck INR today along with recheck labs from recent hospitalization. Will recheck in clinic in one week. Return sooner if needed.   Orders Placed This Encounter  Procedures  . CBC  . COMPLETE METABOLIC PANEL WITH GFR  . INR/PT   No orders of the defined types were placed in this encounter.    Discussed warning signs or symptoms. Please see discharge instructions. Patient expresses understanding.  I  spent 25 minutes with this patient, greater than 50% was face-to-face time counseling regarding goals of care and recent hospitalization and current medications and side effects.Marland Kitchen

## 2016-11-23 ENCOUNTER — Ambulatory Visit (INDEPENDENT_AMBULATORY_CARE_PROVIDER_SITE_OTHER): Payer: Medicare PPO | Admitting: Cardiology

## 2016-11-23 ENCOUNTER — Ambulatory Visit: Payer: Medicare PPO | Admitting: Cardiology

## 2016-11-23 ENCOUNTER — Encounter: Payer: Self-pay | Admitting: Cardiology

## 2016-11-23 VITALS — BP 146/80 | HR 96 | Ht 62.0 in | Wt 157.8 lb

## 2016-11-23 DIAGNOSIS — I10 Essential (primary) hypertension: Secondary | ICD-10-CM | POA: Diagnosis not present

## 2016-11-23 DIAGNOSIS — I2510 Atherosclerotic heart disease of native coronary artery without angina pectoris: Secondary | ICD-10-CM

## 2016-11-23 DIAGNOSIS — I480 Paroxysmal atrial fibrillation: Secondary | ICD-10-CM | POA: Diagnosis not present

## 2016-11-23 DIAGNOSIS — E7800 Pure hypercholesterolemia, unspecified: Secondary | ICD-10-CM

## 2016-11-23 LAB — COMPLETE METABOLIC PANEL WITH GFR
ALBUMIN: 3.2 g/dL — AB (ref 3.6–5.1)
ALT: 25 U/L (ref 6–29)
AST: 29 U/L (ref 10–35)
Alkaline Phosphatase: 99 U/L (ref 33–130)
BILIRUBIN TOTAL: 0.5 mg/dL (ref 0.2–1.2)
BUN: 37 mg/dL — AB (ref 7–25)
CO2: 23 mmol/L (ref 20–32)
Calcium: 8.6 mg/dL (ref 8.6–10.4)
Chloride: 103 mmol/L (ref 98–110)
Creat: 1.41 mg/dL — ABNORMAL HIGH (ref 0.60–0.88)
GFR, Est African American: 39 mL/min — ABNORMAL LOW (ref >=60)
GFR, Est Non African American: 34 mL/min — ABNORMAL LOW (ref >=60)
GLUCOSE: 246 mg/dL — AB (ref 65–99)
Potassium: 4.7 mmol/L (ref 3.5–5.3)
SODIUM: 139 mmol/L (ref 135–146)
TOTAL PROTEIN: 6.1 g/dL (ref 6.1–8.1)

## 2016-11-23 LAB — PROTIME-INR
INR: 1.5 — ABNORMAL HIGH
Prothrombin Time: 15.8 s — ABNORMAL HIGH (ref 9.0–11.5)

## 2016-11-23 LAB — CBC
HCT: 33.7 % — ABNORMAL LOW (ref 35.0–45.0)
HEMOGLOBIN: 10.9 g/dL — AB (ref 11.7–15.5)
MCH: 29.1 pg (ref 27.0–33.0)
MCHC: 32.3 g/dL (ref 32.0–36.0)
MCV: 89.9 fL (ref 80.0–100.0)
MPV: 10 fL (ref 7.5–12.5)
Platelets: 283 10*3/uL (ref 140–400)
RBC: 3.75 MIL/uL — AB (ref 3.80–5.10)
RDW: 18.8 % — ABNORMAL HIGH (ref 11.0–15.0)
WBC: 8.2 10*3/uL (ref 3.8–10.8)

## 2016-11-23 NOTE — Addendum Note (Signed)
Addended by: Collie Siad on: 11/23/2016 09:17 AM   Modules accepted: Orders

## 2016-11-23 NOTE — Progress Notes (Signed)
Referring-Corey Clayburn Pert, MD Reason for referral-Atrial fibrillation  HPI: 83 year old female for evaluation of atrial fibrillation at the request of Clementeen Graham M.D. also with history of coronary artery disease status post coronary artery bypass graft, hypertension, hyperlipidemia and chronic diastolic congestive heart failure. Patient did have coronary artery bypass graft in 2009 with a LIMA to the LAD, saphenous vein graft to OM1 and OM3 and saphenous vein graft to the marginal. Echocardiogram August 2018 showed normal LV systolic function, severe left ventricular hypertrophy, mild aortic stenosis, moderate mitral regurgitation, biatrial enlargement, mild right ventricular enlargement and mild tricuspid regurgitation. Patient was seen recently in the hospital by Dr. Royann Shivers for atrial fibrillation. It was felt that her atrial fibrillation was now likely permanent. Plan was for rate control and anticoagulation. It was noted that she was bradycardic with combination of Cardizem 360 mg daily and metoprolol 25 mg daily. Since discharged patient notes some dyspnea on exertion but no orthopnea, PND, pedal edema, chest pain or syncope.  Current Outpatient Prescriptions  Medication Sig Dispense Refill  . alendronate (FOSAMAX) 70 MG tablet Take 70 mg by mouth every Sunday.     Marland Kitchen aspirin 81 MG EC tablet TAKE ONE TABLET BY MOUTH EVERY DAY (Patient taking differently: TAKE 81 MG BY MOUTH EVERY DAY AT BEDTIME) 28 tablet 2  . Calcium Carb-Cholecalciferol 600-800 MG-UNIT TABS TAKE ONE TABLET BY MOUTH 2 TIMES A DAY 60 tablet 6  . clonazePAM (KLONOPIN) 0.5 MG tablet TAKE 1 TABLET BY MOUTH AT BEDTIME (Patient taking differently: TAKE 0.5 MG BY MOUTH AT BEDTIME) 14 tablet 0  . diltiazem (CARDIZEM CD) 240 MG 24 hr capsule Take 1 capsule (240 mg total) by mouth at bedtime. 90 capsule 1  . Ergocalciferol (VITAMIN D2) 2000 units TABS Take 2,000 Units by mouth daily.     Marland Kitchen FERREX 150 150 MG capsule TAKE ONE CAPSULE BY  MOUTH EVERY DAY (Patient taking differently: TAKE 150 MG BY MOUTH EVERY DAY) 28 capsule 2  . glucose blood test strip Once daily E11.29 100 each 12  . leflunomide (ARAVA) 10 MG tablet TAKE 1 TABLET BY MOUTH AT BEDTIME (Patient taking differently: TAKE 10 MG BY MOUTH AT BEDTIME) 14 tablet 4  . levothyroxine (SYNTHROID, LEVOTHROID) 50 MCG tablet Take 50 mcg by mouth daily.      Marland Kitchen lisinopril (PRINIVIL,ZESTRIL) 5 MG tablet Take 1 tablet (5 mg total) by mouth daily. 30 tablet 0  . metoprolol succinate (TOPROL-XL) 25 MG 24 hr tablet TAKE 1 TABLET BY MOUTH ONCE DAILY (Patient taking differently: TAKE 25 MG BY MOUTH ONCE DAILY) 14 tablet 23  . Multiple Vitamin (MULTIVITAMIN WITH MINERALS) TABS tablet Take 1 tablet by mouth daily.    . pantoprazole (PROTONIX) 40 MG tablet TAKE 1 TABLET BY MOUTH ONCE DAILY (Patient taking differently: TAKE 40 MG BY MOUTH ONCE DAILY) 28 tablet 10  . pravastatin (PRAVACHOL) 40 MG tablet TAKE 1 TABLET BY MOUTH ONCE DAILY (Patient taking differently: TAKE 40 MG BY MOUTH ONCE DAILY) 14 tablet 23  . predniSONE (DELTASONE) 5 MG tablet Take 1 or 2 tablets by mouth two times a day as needed for rheumatoid flair  Due for follow up visit 30 tablet 0  . STOOL SOFTENER 100 MG capsule TAKE 1 CAPSULE BY MOUTH 2 TIMES A DAY (AM & PM) (Patient taking differently: TAKE 100 MG BY MOUTH 2 TIMES A DAY (AM & PM)) 56 capsule 0  . torsemide (DEMADEX) 100 MG tablet TAKE 1/2 TABLET BY MOUTH ONCE DAILY (  Patient taking differently: TAKE 50 MG BY MOUTH ONCE DAILY) 7 tablet 23  . traMADol (ULTRAM) 50 MG tablet TAKE 1 TABLET BY MOUTH TWICE A DAY TAKE 1 TABLET BY MOUTH TWICE A DAYAS NEEDED FOR PAIN (Patient taking differently: Take 50 mg by mouth twice daily as needed for pain) 56 tablet 4  . vitamin B-12 (CYANOCOBALAMIN) 1000 MCG tablet Take 1,000 mcg by mouth daily.    Marland Kitchen warfarin (COUMADIN) 3 MG tablet Take 1 tablet (3 mg total) by mouth daily at 6 PM. Check INR on 8/14, further dose adjustment per pmd. 30  tablet 0   No current facility-administered medications for this visit.     Allergies  Allergen Reactions  . Cefuroxime Anaphylaxis and Other (See Comments)    Throat swelling  . Fish Allergy Shortness Of Breath    Pt said she has throat swelling   . Cephalexin Rash     Past Medical History:  Diagnosis Date  . Acute pancreatitis   . Anemia   . Arthritis   . Atrial fibrillation ([facility])   . CAD (coronary artery disease)   . Carotid artery occlusion   . CHF (congestive heart failure) ([facility])   . Diabetes mellitus age 47  . DJD (degenerative joint disease)   . DVT (deep venous thrombosis) ([facility])   . GERD (gastroesophageal reflux disease)   . GI bleed   . Hyperlipidemia   . Hypertension   . Joint pain   . Mesenteric ischemia   . Peripheral vascular disease ([facility])   . Thyroid disease     Past Surgical History:  Procedure Laterality Date  . APPENDECTOMY    . CELIAC ARTERY STENT    . CORONARY ARTERY BYPASS GRAFT    . EMBOLECTOMY  2009   right leg  . FASCIOTOMY     right leg  . HIP FRACTURE SURGERY Left   . JOINT REPLACEMENT    . TOTAL KNEE ARTHROPLASTY     bilateral  . TUBAL LIGATION    . VISCERAL ANGIOGRAM N/A 05/31/2013   Procedure: MESSENTERIC [name];  Surgeon: [name], MD;  Location: [facility] CATH LAB;  Service: Cardiovascular;  Laterality: N/A;    Social History   Social History  . Marital status: Widowed    Spouse name: N/A  . Number of children: N/A  . Years of education: N/A   Occupational History  . Not on file.   Social History Main Topics  . Smoking status: Never Smoker  . Smokeless tobacco: Never Used  . Alcohol use No  . Drug use: No  . Sexual activity: Not on file   Other Topics Concern  . Not on file   Social History Narrative  . No narrative on file    Family History  Problem Relation Age of Onset  . Cancer Mother        ovarian  . Other Father        suicide  . Coronary artery disease Sister   . Other Sister         alzheimers  . Heart disease Sister        Before age 17  . Diabetes Sister   . Cancer Daughter        spinal tumor  . Diabetes Daughter   . Hypertension Daughter   . Diabetes Son   . Hyperlipidemia Son   . Hypertension Son     ROS: no fevers or chills, productive cough, hemoptysis, dysphasia, odynophagia, melena, hematochezia, dysuria, hematuria,  rash, seizure activity, orthopnea, PND, pedal edema, claudication. Remaining systems are negative.  Physical Exam:   Blood pressure (!) 146/80, pulse 96, height 5\' 2"  (1.575 m), weight 71.6 kg (157 lb 12.8 oz).  General:  Well developed/well nourished in NAD Skin warm/dry Patient not depressed No peripheral clubbing Back-normal HEENT-normal/normal eyelids Neck supple/normal carotid upstroke bilaterally; no bruits; no JVD; no thyromegaly chest - CTA/ normal expansion CV - irregular/normal S1 and S2; 1/6 systolic murmur;  PMI nondisplaced Abdomen -NT/ND, no HSM, no mass, + bowel sounds, no bruit 2+ femoral pulses, no bruits Ext-no edema, chords, 2+ DP Neuro-grossly nonfocal  ECG - atrial fibrillation at a rate of 96. Cannot rule out prior septal infarct. personally reviewed  A/P  1 Atrial fibrillation-we will continue with present dose of metoprolol and Cardizem for rate control. Continue Coumadin. I will arrange a 24-hour Holter monitor to make sure that her rate is controlled.   2 coronary artery disease status post coronary artery bypass and graft-continue statin. Discontinue aspirin given need for Coumadin.  3 chronic diastolic congestive heart failure-patient appears to be euvolemic on examination. Continue present dose of diuretic. She does note some dyspnea on exertion. I have asked her to take an additional 20 mg of Demadex for worsening edema or weight gain of 3 pounds. We discussed low sodium diet and fluid restriction.   4 hypertension-blood pressure is controlled for age. Continue present medications.  5  hyperlipidemia-continue statin.  6 valvular heart disease-mild aortic stenosis/moderate mitral regurgitation. She will need follow-up echoes in the future.  [name], MD

## 2016-11-23 NOTE — Addendum Note (Signed)
Addended by: Collie Siad on: 11/23/2016 09:19 AM   Modules accepted: Orders

## 2016-11-23 NOTE — Patient Instructions (Signed)
Medication Instructions:   STOP ASPIRIN  Testing/Procedures:  Your physician has recommended that you wear a 24 HOUR holter monitor. Holter monitors are medical devices that record the heart's electrical activity. Doctors most often use these monitors to diagnose arrhythmias. Arrhythmias are problems with the speed or rhythm of the heartbeat. The monitor is a small, portable device. You can wear one while you do your normal daily activities. This is usually used to diagnose what is causing palpitations/syncope (passing out).    Follow-Up:  Your physician recommends that you schedule a follow-up appointment in: 8 WEEKS WITH DR [name] IN [facility]   If you need a refill on your cardiac medications before your next appointment, please call your pharmacy.

## 2016-11-28 ENCOUNTER — Other Ambulatory Visit: Payer: Self-pay

## 2016-11-28 DIAGNOSIS — I482 Chronic atrial fibrillation, unspecified: Secondary | ICD-10-CM

## 2016-11-28 MED ORDER — DILTIAZEM HCL ER COATED BEADS 240 MG PO CP24: 240.0000 mg | ORAL_CAPSULE | Freq: Every day | ORAL | 1 refills | Status: DC

## 2016-11-28 MED ORDER — LEFLUNOMIDE 10 MG PO TABS: 10.0000 mg | ORAL_TABLET | Freq: Every day | ORAL | 1 refills | Status: DC

## 2016-11-28 MED ORDER — PANTOPRAZOLE SODIUM 40 MG PO TBEC: 40.0000 mg | DELAYED_RELEASE_TABLET | Freq: Every day | ORAL | 1 refills | Status: DC

## 2016-11-28 MED ORDER — WARFARIN SODIUM 3 MG PO TABS: 3.0000 mg | ORAL_TABLET | Freq: Every day | ORAL | 1 refills | Status: DC

## 2016-11-28 MED ORDER — ALENDRONATE SODIUM 70 MG PO TABS: 70.0000 mg | ORAL_TABLET | ORAL | 1 refills | Status: DC

## 2016-11-28 MED ORDER — METOPROLOL SUCCINATE ER 25 MG PO TB24: 25.0000 mg | ORAL_TABLET | Freq: Every day | ORAL | 1 refills | Status: DC

## 2016-11-28 MED ORDER — TORSEMIDE 100 MG PO TABS: 50.0000 mg | ORAL_TABLET | Freq: Every day | ORAL | 1 refills | Status: DC

## 2016-11-28 MED ORDER — PRAVASTATIN SODIUM 40 MG PO TABS: 40.0000 mg | ORAL_TABLET | Freq: Every day | ORAL | 1 refills | Status: DC

## 2016-11-29 ENCOUNTER — Telehealth: Payer: Self-pay | Admitting: Cardiology

## 2016-11-29 ENCOUNTER — Ambulatory Visit: Payer: Medicare PPO | Admitting: Family Medicine

## 2016-11-29 NOTE — Telephone Encounter (Signed)
Yes [name]  

## 2016-11-29 NOTE — Telephone Encounter (Signed)
Pt of Dr. [name] Seen for OV on 8/15 in [place], she has been set up for 24 hr monitor on 8/29.  Spoke to [employer], Surveyor, mining, as well as patient's daughter [name] for supplemental information.  Per report, patient has been experiencing shortness of breath on exertion (walking).   She denies SOB at rest. Dry non-productive cough.  No LE swelling. Denies chest pain.  VS stable today - T 98.3 O2 98% RR 22 HR 94 BP 120/64  Dyspnea was noted at visit last Wednesday, daughter thinks it may have become worse but is not with patient. Home health RN did not know if more problematic than when last seen by Dr. [name]. Aware I will route to physician to advise.  Daughter would prefer f/u w [facility] if advised but agreeable to PA visit. Pt is resident of [facility] & they may be called for transportation arrangements.

## 2016-11-29 NOTE — Telephone Encounter (Signed)
Follow up     Thurston Hole from [facility] is calling about pt SOB.   Pt c/o Shortness Of Breath: STAT if SOB developed within the last 24 hours or pt is noticeably SOB on the phone  1. Are you currently SOB (can you hear that pt is SOB on the phone)? Thurston Hole said pt is having 22 breaths a minute  2. How long have you been experiencing SOB? Ever since coming home from hospital  3. Are you SOB when sitting or when up moving around? both  4. Are you currently experiencing any other symptoms? No

## 2016-11-29 NOTE — Telephone Encounter (Signed)
Spoke to patient. She clarifies symptoms no worse than since leaving hospital.  I've asked patient to call if she has new concerns. If worsening symptoms, advised she may need to seek ED eval.  Discussed plan w Thurston Hole, [facility]. She'll call w update for [place] on Thursday.  Daughter also aware of plan of care.  Patient, daughter, and home health nurse have all been advised on recommendation via separate phone calls. Understanding and thanks verbalized.

## 2016-11-29 NOTE — Telephone Encounter (Signed)
Take additional 20 mg lasix daily for increased edema or weight gain of 2-3 lbs; await holter results. [name]

## 2016-11-29 NOTE — Telephone Encounter (Signed)
Spoke w daughter - patient not on furosemide -- she is on torsemide 50mg  daily (1/2 100mg  tablet). OK to take additional 1/2 tab for same parameters?

## 2016-11-29 NOTE — Telephone Encounter (Signed)
New message     Pt c/o Shortness Of Breath: STAT if SOB developed within the last 24 hours or pt is noticeably SOB on the phone  1. Are you currently SOB (can you hear that pt is SOB on the phone)?  No daughter called   2. How long have you been experiencing SOB?  Since last wednesday  3. Are you SOB when sitting or when up moving around? With exertion   4. Are you currently experiencing any other symptoms? no  [facility] and everything is good, she is not having any swelling , her respirations are good

## 2016-11-30 MED ORDER — CLONAZEPAM 0.5 MG PO TABS: 0.5000 mg | ORAL_TABLET | Freq: Every day | ORAL | 1 refills | Status: DC

## 2016-11-30 MED ORDER — TRAMADOL HCL 50 MG PO TABS: 50.0000 mg | ORAL_TABLET | Freq: Two times a day (BID) | ORAL | 1 refills | Status: DC | PRN

## 2016-11-30 MED ORDER — LEVOTHYROXINE SODIUM 50 MCG PO TABS: 50.0000 ug | ORAL_TABLET | Freq: Every day | ORAL | 1 refills | Status: DC

## 2016-12-01 ENCOUNTER — Ambulatory Visit: Payer: Medicare PPO | Admitting: Physician Assistant

## 2016-12-01 ENCOUNTER — Ambulatory Visit (INDEPENDENT_AMBULATORY_CARE_PROVIDER_SITE_OTHER): Payer: Medicare PPO | Admitting: Family Medicine

## 2016-12-01 ENCOUNTER — Telehealth: Payer: Self-pay | Admitting: Family Medicine

## 2016-12-01 VITALS — BP 140/78 | HR 92 | Wt 158.0 lb

## 2016-12-01 DIAGNOSIS — Z7901 Long term (current) use of anticoagulants: Secondary | ICD-10-CM

## 2016-12-01 DIAGNOSIS — Z23 Encounter for immunization: Secondary | ICD-10-CM | POA: Diagnosis not present

## 2016-12-01 DIAGNOSIS — I5032 Chronic diastolic (congestive) heart failure: Secondary | ICD-10-CM

## 2016-12-01 DIAGNOSIS — I5033 Acute on chronic diastolic (congestive) heart failure: Secondary | ICD-10-CM

## 2016-12-01 DIAGNOSIS — G4700 Insomnia, unspecified: Secondary | ICD-10-CM | POA: Diagnosis not present

## 2016-12-01 DIAGNOSIS — I4891 Unspecified atrial fibrillation: Secondary | ICD-10-CM | POA: Diagnosis not present

## 2016-12-01 LAB — POCT INR: INR: 4.4

## 2016-12-01 MED ORDER — WARFARIN SODIUM 3 MG PO TABS
ORAL_TABLET | ORAL | 1 refills | Status: DC
Start: 2016-12-01 — End: 2016-12-08

## 2016-12-01 MED ORDER — WARFARIN SODIUM 3 MG PO TABS: ORAL_TABLET | ORAL | 1 refills | Status: DC

## 2016-12-01 MED ORDER — TRAZODONE HCL 50 MG PO TABS: 25.0000 mg | ORAL_TABLET | Freq: Every evening | ORAL | 0 refills | Status: DC | PRN

## 2016-12-01 NOTE — Progress Notes (Signed)
[name] is a 83 y.o. female who presents to [name]: Primary Care Sports Medicine today for anxiety shortness of breath and follow-up INR.  Anxiety: Patient has been ill recently and notes that her anxiety has worsened. This is associated with difficulty staying asleep. She has been taking clonazepam in the evening for years which typically does work. She notes recently has not been as effective as usual. She denies any personal history of depression.  Shortness of breath: Patient notes continued shortness of breath and fatigue especially with exertion. She's been hospitalized recently for atrial fibrillation with RVR. She is currently being evaluated by cardiology.  Her cardiac issues are atrial fibrillation coronary artery disease diastolic congestive heart failure and hypertension. Additionally she has mild aortic stenosis and moderate mitral regurgitation. She denies any current leg swelling or wheezing.  Anticoagulation: Patient uses warfarin daily for anticoagulation. She typically is in range but notes recently she's had trouble with or INR range. Her usual dose is 3 mg daily.   Past Medical History:  Diagnosis Date  . Acute pancreatitis   . Anemia   . Arthritis   . Atrial fibrillation (HCC)   . CAD (coronary artery disease)   . Carotid artery occlusion   . CHF (congestive heart failure) (HCC)   . Diabetes mellitus age 31  . DJD (degenerative joint disease)   . DVT (deep venous thrombosis) (HCC)   . GERD (gastroesophageal reflux disease)   . GI bleed   . Hyperlipidemia   . Hypertension   . Joint pain   . Mesenteric ischemia   . Peripheral vascular disease (HCC)   . Thyroid disease    Past Surgical History:  Procedure Laterality Date  . APPENDECTOMY    . CELIAC ARTERY STENT    . CORONARY ARTERY BYPASS GRAFT    . EMBOLECTOMY  2009   right leg  . FASCIOTOMY     right  leg  . HIP FRACTURE SURGERY Left   . JOINT REPLACEMENT    . TOTAL KNEE ARTHROPLASTY     bilateral  . TUBAL LIGATION    . VISCERAL ANGIOGRAM N/A 05/31/2013   Procedure: MESSENTERIC [name];  Surgeon: [name], MD;  Location[facility] C[facility] LAB;  Service: Cardiovascular;  Laterality: N/A;   Social History  Substance Use Topics  . Smoking status: Never Smoker  . Smokeless tobacco: Never Used  . Alcohol use No   family history includes Cancer in her daughter and mother; Coronary artery disease in her sister; Diabetes in her daughter, sister, and son; Heart disease in her sister; Hyperlipidemia in her son; Hypertension in her daughter and son; Other in her father and sister.  ROS as above:  Medications: Current Outpatient Prescriptions  Medication Sig Dispense Refill  . [START ON 12/04/2016] alendronate (FOSAMAX) 70 MG tablet Take 1 tablet (70 mg total) by mouth every Sunday. 12 tablet 1  . Calcium Carb-Cholecalciferol 600-800 MG-UNIT TABS TAKE ONE TABLET BY MOUTH 2 TIMES A DAY 60 tablet 6  . clonazePAM (KLONOPIN) 0.5 MG tablet Take 1 tablet (0.5 mg total) by mouth at bedtime. 90 tablet 1  . diltiazem (CARDIZEM CD) 240 MG 24 hr capsule Take 1 capsule (240 mg total) by mouth at bedtime. 90 capsule 1  . Ergocalciferol (VITAMIN D2) 2000 units TABS Take 2,000 Units by mouth daily.     Marland Kitchen FERREX 150 150 MG capsule TAKE ONE CAPSULE BY MOUTH EVERY DAY (Patient taking differently: TAKE  150 MG BY MOUTH EVERY DAY) 28 capsule 2  . glucose blood test strip Once daily E11.29 100 each 12  . leflunomide (ARAVA) 10 MG tablet Take 1 tablet (10 mg total) by mouth at bedtime. 90 tablet 1  . levothyroxine (SYNTHROID, LEVOTHROID) 50 MCG tablet Take 1 tablet (50 mcg total) by mouth daily. 90 tablet 1  . lisinopril (PRINIVIL,ZESTRIL) 5 MG tablet Take 1 tablet (5 mg total) by mouth daily. 30 tablet 0  . metoprolol succinate (TOPROL-XL) 25 MG 24 hr tablet Take 1 tablet (25 mg total) by mouth daily. 90 tablet 1    . Multiple Vitamin (MULTIVITAMIN WITH MINERALS) TABS tablet Take 1 tablet by mouth daily.    . pantoprazole (PROTONIX) 40 MG tablet Take 1 tablet (40 mg total) by mouth daily. 90 tablet 1  . pravastatin (PRAVACHOL) 40 MG tablet Take 1 tablet (40 mg total) by mouth daily. 90 tablet 1  . predniSONE (DELTASONE) 5 MG tablet Take 1 or 2 tablets by mouth two times a day as needed for rheumatoid flair  Due for follow up visit 30 tablet 0  . STOOL SOFTENER 100 MG capsule TAKE 1 CAPSULE BY MOUTH 2 TIMES A DAY (AM & PM) (Patient taking differently: TAKE 100 MG BY MOUTH 2 TIMES A DAY (AM & PM)) 56 capsule 0  . torsemide (DEMADEX) 100 MG tablet Take 0.5 tablets (50 mg total) by mouth daily. 45 tablet 1  . traMADol (ULTRAM) 50 MG tablet Take 1 tablet (50 mg total) by mouth 2 (two) times daily as needed. Please fax to [facility]. 180 tablet 1  . vitamin B-12 (CYANOCOBALAMIN) 1000 MCG tablet Take 1,000 mcg by mouth daily.    Marland Kitchen warfarin (COUMADIN) 3 MG tablet Take 3mg  daily expect Tuesdays and Fridays. No note warfarin on those days. 90 tablet 1  . traZODone (DESYREL) 50 MG tablet Take 0.5-1 tablets (25-50 mg total) by mouth at bedtime as needed for sleep. 90 tablet 0   No current facility-administered medications for this visit.    Allergies  Allergen Reactions  . Cefuroxime Anaphylaxis and Other (See Comments)    Throat swelling  . Fish Allergy Shortness Of Breath    Pt said she has throat swelling   . Cephalexin Rash    Health Maintenance Health Maintenance  Topic Date Due  . OPHTHALMOLOGY EXAM  07/13/2016  . INFLUENZA VACCINE  12/29/2016 (Originally 11/09/2016)  . HEMOGLOBIN A1C  05/22/2017  . FOOT EXAM  07/26/2017  . TETANUS/TDAP  12/28/2025  . PNA vac Low Risk Adult  Completed     Exam:  BP 140/78   Pulse 92   Wt 158 lb (71.7 kg)   SpO2 96%   BMI 28.90 kg/m   Ambulatory pulse oximetry did not go below 96 bpm. Patient felt fatigue after 4 minutes of ambulation and had to rest. Gen: Well  NAD HEENT: EOMI,  MMM Lungs: Normal work of breathing. CTABL Heart: Rate around 90 bpm. Irregular rhythm. no no obvious murmurs to my exam today. Abd: NABS, Soft. Nondistended, Nontender Exts: Brisk capillary refill, warm and well perfused. No edema Psych: Alert and oriented normal speech thought process and affect.  Depression screen [facility] 2/9 12/01/2016 11/02/2016  Decreased Interest 3 0  Down, Depressed, Hopeless 3 0  PHQ - 2 Score 6 0  Altered sleeping 3 -  Tired, decreased energy 3 -  Change in appetite 0 -  Feeling bad or failure about yourself  0 -  Trouble concentrating 3 -  Moving slowly or fidgety/restless 0 -  Suicidal thoughts 0 -  PHQ-9 Score 15 -   GAD 7 : Generalized Anxiety Score 12/01/2016  Nervous, Anxious, on Edge 2  Control/stop worrying 0  Worry too much - different things 0  Trouble relaxing 3  Restless 3  Easily annoyed or irritable 3  Afraid - awful might happen 3  Total GAD 7 Score 14       Results for orders placed or performed in visit on 12/01/16 (from the past 72 hour(s))  POCT INR     Status: None   Collection Time: 12/01/16  1:27 PM  Result Value Ref Range   INR 4.4    No results found.    Assessment and Plan: 83 y.o. female with  Anxiety and insomnia. Likely related to recent illness. Plan to continue clonazepam and start low-dose trazodone at bedtime. Recheck in 1 week.  Shortness of breath: Unclear etiology. Doubtful for oxygen need based on ambulatory pulse oximetry today. I suspect she has either not fantastic rate control with exertion or worsening diastolic dysfunction/failure. She may benefit from cardiac rehabilitation and I sent a message to her cardiologist to inquire about this. Plan to recheck in 1 week.  Anticoagulation: INR out of range again today. This is not typical for her. Plan to stop warfarin for 2 days and restart at 3 mg every day except for Tuesdays and Fridays where she will not take warfarin. Recheck in 1  week.  Influenza vaccine given today.   Orders Placed This Encounter  Procedures  . Flu vaccine HIGH DOSE PF  . POCT INR   Meds ordered this encounter  Medications  . DISCONTD: warfarin (COUMADIN) 3 MG tablet    Sig: Take 3mg  daily expect Tuesdays and Fridays. No note warfarin on those days.    Dispense:  90 tablet    Refill:  1  . warfarin (COUMADIN) 3 MG tablet    Sig: Take 3mg  daily expect Tuesdays and Fridays. No note warfarin on those days.    Dispense:  90 tablet    Refill:  1  . traZODone (DESYREL) 50 MG tablet    Sig: Take 0.5-1 tablets (25-50 mg total) by mouth at bedtime as needed for sleep.    Dispense:  90 tablet    Refill:  0     Discussed warning signs or symptoms. Please see discharge instructions. Patient expresses understanding.

## 2016-12-01 NOTE — Telephone Encounter (Signed)
Curtis from [facility] came in and stated that he had spoken to the [facility] Coordinator/Nurse at [facility] where [name] lives and stated that he believes the patient would benefit from the Heart failure Empowerment Program that [facility] offers and would like to know if you would want to refer her to it . Thanks

## 2016-12-01 NOTE — Patient Instructions (Addendum)
Thank you for coming in today. STOP warfarin for 2 days.  Restart 3mg  daily except Tuesday and Friday.  Recheck 1 week.   I will talk with Dr Thursday about cardiac rehab.   Try using over the counter Zyrtec (Certizine) daily for allergies.   Take trazodone at bedtime for sleep.   You should also hear from therapy soon about mood.

## 2016-12-02 ENCOUNTER — Other Ambulatory Visit: Payer: Self-pay

## 2016-12-02 MED ORDER — PREDNISONE 5 MG PO TABS: ORAL_TABLET | ORAL | 0 refills | Status: DC

## 2016-12-02 NOTE — Telephone Encounter (Signed)
Pended order for home health. Referral requested per Lyda Jester from [facility].

## 2016-12-02 NOTE — Telephone Encounter (Signed)
I think that is a great idea.  Verbal order given.  Have them fax me paperwork.

## 2016-12-05 NOTE — Telephone Encounter (Signed)
Referral ordered

## 2016-12-07 ENCOUNTER — Telehealth: Payer: Self-pay | Admitting: Cardiology

## 2016-12-07 ENCOUNTER — Ambulatory Visit (INDEPENDENT_AMBULATORY_CARE_PROVIDER_SITE_OTHER): Payer: Medicare PPO

## 2016-12-07 DIAGNOSIS — I480 Paroxysmal atrial fibrillation: Secondary | ICD-10-CM

## 2016-12-07 NOTE — Telephone Encounter (Signed)
S/w [facility] ([facility]) she states that she was there last week and now pt is up 3# since last week, she states that her swelling is in Bilat arms(arms are bruised from itching, wasn't this way last week),but swelling is down in bilat LE right is down 0.5 cm and left is down 1.5cm, so nurse doesn't think that increasing torsemide will help, denies SOB or any other sx-per nurse. Pt/inr last week was 4. please advise  Also, Ann ([facility]) left a vm message for PCP to check pt/INR at appt tomorrow.

## 2016-12-07 NOTE — Telephone Encounter (Signed)
Unable to reach pt or leave a message  Spoke with anne, the patient has an appointment with her medical doctor tomorrow, Thurston Hole feels that will be fine. She will let us know after that appt if the patient needs to come here.

## 2016-12-07 NOTE — Telephone Encounter (Signed)
Pt c/o swelling: STAT is pt has developed SOB within 24 hours  1. How long have you been experiencing swelling?   2. Where is the swelling located? Both arms, from wrist to elbow  3.  Are you currently taking a "fluid pill"?  4.  Are you currently SOB? no  5.  Have you traveled recently?no   Multimedia programmer with  [facility]

## 2016-12-07 NOTE — Telephone Encounter (Signed)
[name]  

## 2016-12-08 ENCOUNTER — Encounter: Payer: Self-pay | Admitting: Family Medicine

## 2016-12-08 ENCOUNTER — Ambulatory Visit (INDEPENDENT_AMBULATORY_CARE_PROVIDER_SITE_OTHER): Payer: Medicare PPO | Admitting: Family Medicine

## 2016-12-08 VITALS — BP 123/44 | HR 70 | Temp 98.7°F | Wt 164.0 lb

## 2016-12-08 DIAGNOSIS — G4700 Insomnia, unspecified: Secondary | ICD-10-CM | POA: Diagnosis not present

## 2016-12-08 DIAGNOSIS — N184 Chronic kidney disease, stage 4 (severe): Secondary | ICD-10-CM | POA: Diagnosis not present

## 2016-12-08 DIAGNOSIS — I10 Essential (primary) hypertension: Secondary | ICD-10-CM

## 2016-12-08 DIAGNOSIS — N3001 Acute cystitis with hematuria: Secondary | ICD-10-CM

## 2016-12-08 DIAGNOSIS — R300 Dysuria: Secondary | ICD-10-CM | POA: Diagnosis not present

## 2016-12-08 DIAGNOSIS — I4891 Unspecified atrial fibrillation: Secondary | ICD-10-CM | POA: Diagnosis not present

## 2016-12-08 LAB — CBC
HCT: 29.9 % — ABNORMAL LOW (ref 35.0–45.0)
Hemoglobin: 9.7 g/dL — ABNORMAL LOW (ref 11.7–15.5)
MCH: 29 pg (ref 27.0–33.0)
MCHC: 32.4 g/dL (ref 32.0–36.0)
MCV: 89.5 fL (ref 80.0–100.0)
MPV: 9.2 fL (ref 7.5–12.5)
Platelets: 211 10*3/uL (ref 140–400)
RBC: 3.34 MIL/uL — AB (ref 3.80–5.10)
RDW: 18.4 % — ABNORMAL HIGH (ref 11.0–15.0)
WBC: 10.7 10*3/uL (ref 3.8–10.8)

## 2016-12-08 LAB — POCT URINALYSIS DIPSTICK
Bilirubin, UA: NEGATIVE
GLUCOSE UA: NEGATIVE
Ketones, UA: NEGATIVE
Nitrite, UA: NEGATIVE
SPEC GRAV UA: 1.02 (ref 1.010–1.025)
UROBILINOGEN UA: 0.2 U/dL
pH, UA: 5.5 (ref 5.0–8.0)

## 2016-12-08 LAB — POCT INR: INR: 3.5

## 2016-12-08 MED ORDER — CIPROFLOXACIN HCL 250 MG PO TABS: 250.0000 mg | ORAL_TABLET | Freq: Two times a day (BID) | ORAL | 0 refills | Status: DC

## 2016-12-08 MED ORDER — WARFARIN SODIUM 1 MG PO TABS: ORAL_TABLET | ORAL | 1 refills | Status: DC

## 2016-12-08 NOTE — Progress Notes (Signed)
[name] is a 83 y.o. female who presents to [name]: Primary Care Sports Medicine today for shortness of breath urinary tract infection, INR, and insomnia.  Shortness of breath: Patient is been seen several times for fatigue and shortness of breath with exertion. This is thought to be deconditioning versus cardiac etiology. She is in the process of workup with cardiology. She's been authorized to start but has not yet started cardiac rehabilitation at her assisted-living facility. She notes that she's feeling reasonably well with no chest pain or palpitations.  Urinary tract infection: Patient has some mild urinary frequency consistent with prior UTI. No fevers or chills.  Insomnia: Patient continues to experience insomnia. She was started on trazodone last week which she notes has been helpful. She notes that she is able to fall asleep but has difficulty staying asleep.   Anticoagulation: Patient has had difficulty continuing her steady warfarin dose since she was started on diltiazem. She has her INR is been up and down. She denies any bleeding. She currently takes 3 mg of warfarin 5 days per week.  Past Medical History:  Diagnosis Date  . Acute pancreatitis   . Anemia   . Arthritis   . Atrial fibrillation (HCC)   . CAD (coronary artery disease)   . Carotid artery occlusion   . CHF (congestive heart failure) (HCC)   . Diabetes mellitus age 83  . DJD (degenerative joint disease)   . DVT (deep venous thrombosis) (HCC)   . GERD (gastroesophageal reflux disease)   . GI bleed   . Hyperlipidemia   . Hypertension   . Joint pain   . Mesenteric ischemia   . Peripheral vascular disease (HCC)   . Thyroid disease    Past Surgical History:  Procedure Laterality Date  . APPENDECTOMY    . CELIAC ARTERY STENT    . CORONARY ARTERY BYPASS GRAFT    . EMBOLECTOMY  2009   right leg  .  FASCIOTOMY     right leg  . HIP FRACTURE SURGERY Left   . JOINT REPLACEMENT    . TOTAL KNEE ARTHROPLASTY     bilateral  . TUBAL LIGATION    . VISCERAL ANGIOGRAM N/A 05/31/2013   Procedure: MESSENTERIC [name];  Surgeon: Cheryl Kerns, MD;  Location: [facility] CATH LAB;  Service: Cardiovascular;  Laterality: N/A;   Social History  Substance Use Topics  . Smoking status: Never Smoker  . Smokeless tobacco: Never Used  . Alcohol use No   family history includes Cancer in her daughter and mother; Coronary artery disease in her sister; Diabetes in her daughter, sister, and son; Heart disease in her sister; Hyperlipidemia in her son; Hypertension in her daughter and son; Other in her father and sister.  ROS as above:  Medications: Current Outpatient Prescriptions  Medication Sig Dispense Refill  . alendronate (FOSAMAX) 70 MG tablet Take 1 tablet (70 mg total) by mouth every Sunday. 12 tablet 1  . Calcium Carb-Cholecalciferol 600-800 MG-UNIT TABS TAKE ONE TABLET BY MOUTH 2 TIMES A DAY 60 tablet 6  . ciprofloxacin (CIPRO) 250 MG tablet Take 1 tablet (250 mg total) by mouth 2 (two) times daily. 14 tablet 0  . clonazePAM (KLONOPIN) 0.5 MG tablet Take 1 tablet (0.5 mg total) by mouth at bedtime. 90 tablet 1  . diltiazem (CARDIZEM CD) 240 MG 24 hr capsule Take 1 capsule (240 mg total) by mouth at bedtime. 90 capsule 1  . Ergocalciferol (  VITAMIN D2) 2000 units TABS Take 2,000 Units by mouth daily.     Marland Kitchen FERREX 150 150 MG capsule TAKE ONE CAPSULE BY MOUTH EVERY DAY (Patient taking differently: TAKE 150 MG BY MOUTH EVERY DAY) 28 capsule 2  . glucose blood test strip Once daily E11.29 100 each 12  . leflunomide (ARAVA) 10 MG tablet Take 1 tablet (10 mg total) by mouth at bedtime. 90 tablet 1  . levothyroxine (SYNTHROID, LEVOTHROID) 50 MCG tablet Take 1 tablet (50 mcg total) by mouth daily. 90 tablet 1  . metoprolol succinate (TOPROL-XL) 25 MG 24 hr tablet Take 1 tablet (25 mg total) by mouth daily. 90  tablet 1  . Multiple Vitamin (MULTIVITAMIN WITH MINERALS) TABS tablet Take 1 tablet by mouth daily.    . pantoprazole (PROTONIX) 40 MG tablet Take 1 tablet (40 mg total) by mouth daily. 90 tablet 1  . pravastatin (PRAVACHOL) 40 MG tablet Take 1 tablet (40 mg total) by mouth daily. 90 tablet 1  . predniSONE (DELTASONE) 5 MG tablet Take 1 or 2 tablets by mouth two times a day as needed for rheumatoid flair. 180 tablet 0  . STOOL SOFTENER 100 MG capsule TAKE 1 CAPSULE BY MOUTH 2 TIMES A DAY (AM & PM) (Patient taking differently: TAKE 100 MG BY MOUTH 2 TIMES A DAY (AM & PM)) 56 capsule 0  . torsemide (DEMADEX) 100 MG tablet Take 0.5 tablets (50 mg total) by mouth daily. 45 tablet 1  . traMADol (ULTRAM) 50 MG tablet Take 1 tablet (50 mg total) by mouth 2 (two) times daily as needed. Please fax to [facility]. 180 tablet 1  . traZODone (DESYREL) 50 MG tablet Take 0.5-1 tablets (25-50 mg total) by mouth at bedtime as needed for sleep. 90 tablet 0  . vitamin B-12 (CYANOCOBALAMIN) 1000 MCG tablet Take 1,000 mcg by mouth daily.    Marland Kitchen warfarin (COUMADIN) 1 MG tablet Take 1mg  daily 90 tablet 1   No current facility-administered medications for this visit.    Allergies  Allergen Reactions  . Cefuroxime Anaphylaxis and Other (See Comments)    Throat swelling  . Fish Allergy Shortness Of Breath    Pt said she has throat swelling   . Cephalexin Rash    Health Maintenance Health Maintenance  Topic Date Due  . OPHTHALMOLOGY EXAM  07/13/2016  . HEMOGLOBIN A1C  05/22/2017  . FOOT EXAM  07/26/2017  . TETANUS/TDAP  12/28/2025  . INFLUENZA VACCINE  Completed  . PNA vac Low Risk Adult  Completed     Exam:  BP (!) 123/44   Pulse 70   Temp 98.7 F (37.1 C) (Oral)   Wt 164 lb (74.4 kg)   BMI 30.00 kg/m  Gen: Well NAD HEENT: EOMI,  MMM Lungs: Normal work of breathing. CTABL Heart: Irregular rhythmno MRG Abd: NABS, Soft. Nondistended, Nontender Exts: Brisk capillary refill, warm and well perfused.     Results for orders placed or performed in visit on 12/08/16 (from the past 72 hour(s))  POCT INR     Status: Abnormal   Collection Time: 12/08/16  2:12 PM  Result Value Ref Range   INR 3.5   Urinalysis Dipstick     Status: Abnormal   Collection Time: 12/08/16  2:13 PM  Result Value Ref Range   Color, UA yellow    Clarity, UA cloudy    Glucose, UA negative    Bilirubin, UA negative    Ketones, UA negative    Spec Grav, UA  1.020 1.010 - 1.025   Blood, UA moderate    pH, UA 5.5 5.0 - 8.0   Protein, UA 30mg /dl    Urobilinogen, UA 0.2 0.2 or 1.0 E.U./dL   Nitrite, UA negative    Leukocytes, UA Large (3+) (A) Negative   No results found.    Assessment and Plan: 83 y.o. female with  Fatigue: Unclear etiology likely deconditioning. Plan for cardiac rehabilitation recheck in 2 weeks.  Urinary tract infection: Culture pending empiric treatment with Cipro due to drug allergies.  Hypertension: Blood pressures in the low today. Stop lisinopril and continue rate control medications. Recheck in 2 weeks. Check metabolic panel today.  Anticoagulation: INR improved but still quite high. Plan to reduce to 1 mg of warfarin daily and recheck in 2 weeks.  Insomnia: Improved continue trazodone.   Orders Placed This Encounter  Procedures  . Urine Culture  . COMPLETE METABOLIC PANEL WITH GFR  . CBC  . Urinalysis Dipstick  . POCT INR   Meds ordered this encounter  Medications  . warfarin (COUMADIN) 1 MG tablet    Sig: Take 1mg  daily    Dispense:  90 tablet    Refill:  1  . ciprofloxacin (CIPRO) 250 MG tablet    Sig: Take 1 tablet (250 mg total) by mouth 2 (two) times daily.    Dispense:  14 tablet    Refill:  0     Discussed warning signs or symptoms. Please see discharge instructions. Patient expresses understanding.

## 2016-12-08 NOTE — Patient Instructions (Addendum)
Thank you for coming in today. Decrease the Warfarin to 1 mg daily. New prescription sent.  Start Cipro twice daily for 1 week.  Get labs today.  Recheck in 2 weeks.   STOP Lisinopril daily.    You should be starting Heart Rehab soon.  Let me know if you do not start.

## 2016-12-09 ENCOUNTER — Other Ambulatory Visit: Payer: Self-pay | Admitting: *Deleted

## 2016-12-09 LAB — COMPLETE METABOLIC PANEL WITH GFR
ALK PHOS: 94 U/L (ref 33–130)
ALT: 14 U/L (ref 6–29)
AST: 17 U/L (ref 10–35)
Albumin: 3.2 g/dL — ABNORMAL LOW (ref 3.6–5.1)
BILIRUBIN TOTAL: 0.7 mg/dL (ref 0.2–1.2)
BUN: 46 mg/dL — ABNORMAL HIGH (ref 7–25)
CALCIUM: 8.8 mg/dL (ref 8.6–10.4)
CO2: 20 mmol/L (ref 20–32)
Chloride: 103 mmol/L (ref 98–110)
Creat: 1.86 mg/dL — ABNORMAL HIGH (ref 0.60–0.88)
GFR, EST AFRICAN AMERICAN: 28 mL/min — AB (ref >=60)
GFR, EST NON AFRICAN AMERICAN: 24 mL/min — AB (ref >=60)
Glucose, Bld: 145 mg/dL — ABNORMAL HIGH (ref 65–99)
Potassium: 4.2 mmol/L (ref 3.5–5.3)
Sodium: 137 mmol/L (ref 135–146)
TOTAL PROTEIN: 5.8 g/dL — AB (ref 6.1–8.1)

## 2016-12-09 MED ORDER — WARFARIN SODIUM 1 MG PO TABS
ORAL_TABLET | ORAL | 0 refills | Status: DC
Start: 2016-12-09 — End: 2017-01-25

## 2016-12-09 MED ORDER — TORSEMIDE 20 MG PO TABS: 20.0000 mg | ORAL_TABLET | Freq: Every day | ORAL | 1 refills | Status: DC

## 2016-12-09 NOTE — Addendum Note (Signed)
Addended by: Rodolph Bong on: 12/09/2016 07:28 AM   Modules accepted: Orders

## 2016-12-12 LAB — URINE CULTURE

## 2016-12-13 ENCOUNTER — Other Ambulatory Visit: Payer: Self-pay | Admitting: *Deleted

## 2016-12-13 ENCOUNTER — Telehealth: Payer: Self-pay | Admitting: *Deleted

## 2016-12-13 DIAGNOSIS — E1122 Type 2 diabetes mellitus with diabetic chronic kidney disease: Secondary | ICD-10-CM

## 2016-12-13 DIAGNOSIS — N184 Chronic kidney disease, stage 4 (severe): Secondary | ICD-10-CM

## 2016-12-13 DIAGNOSIS — N183 Chronic kidney disease, stage 3 unspecified: Secondary | ICD-10-CM

## 2016-12-13 MED ORDER — SULFAMETHOXAZOLE-TRIMETHOPRIM 400-80 MG PO TABS: 1.0000 | ORAL_TABLET | Freq: Two times a day (BID) | ORAL | 0 refills | Status: DC

## 2016-12-13 NOTE — Telephone Encounter (Signed)
Pt's daughter wanted you to know that since starting the Cipro, pt has itchy red rash on her neck & sores in her nose.  She has not taken any doses yesterday or today.  Daughter would like to know if you want to change her abx to something else.  Please advise.

## 2016-12-13 NOTE — Telephone Encounter (Signed)
STOP Cipro.  START Bactrim twice daily.  I sent a new rx to [facility].

## 2016-12-13 NOTE — Telephone Encounter (Signed)
Pt's daughter notified of rx change.  Cipro added to allergy list.

## 2016-12-14 ENCOUNTER — Telehealth: Payer: Self-pay | Admitting: *Deleted

## 2016-12-14 DIAGNOSIS — M0579 Rheumatoid arthritis with rheumatoid factor of multiple sites without organ or systems involvement: Secondary | ICD-10-CM | POA: Diagnosis not present

## 2016-12-14 NOTE — Telephone Encounter (Signed)
Ann from [facility] called with report on Ms. Glauser.  She has gained 5lbs since Friday  She has a lot of swelling in her left arm and hand.  Her ankles are also swollen, right ankle has increased by .5cm and the left by 1cm.  Please advise.

## 2016-12-14 NOTE — Telephone Encounter (Signed)
Ann from [facility] & pt's daughter notified of torsemide dose increase.  Pt has appt on Monday.

## 2016-12-14 NOTE — Telephone Encounter (Signed)
Increase torsemide to 40 (2 of the 20s).  Recheck with me in a few days as scheduled.  Return sooner if needed.

## 2016-12-15 ENCOUNTER — Telehealth: Payer: Self-pay | Admitting: *Deleted

## 2016-12-15 DIAGNOSIS — I482 Chronic atrial fibrillation, unspecified: Secondary | ICD-10-CM

## 2016-12-15 MED ORDER — METOPROLOL SUCCINATE ER 50 MG PO TB24: 50.0000 mg | ORAL_TABLET | Freq: Every day | ORAL | 3 refills | Status: DC

## 2016-12-15 NOTE — Telephone Encounter (Signed)
[name] called about her mom. She wants Dr Denyse Amass to recommend a Nephrologist for her mom's kidney disease. Please advise.

## 2016-12-15 NOTE — Telephone Encounter (Addendum)
-----   Message from Lewayne Bunting, MD sent at 12/13/2016  8:32 AM EDT ----- Rate upper normal; change toprol to 50 mg daily [name]    Spoke with pt dtr, Aware of dr Ludwig Clarks recommendations. New script sent to the pharmacy

## 2016-12-15 NOTE — Telephone Encounter (Signed)
To clarify Torsemide will increase to 40mg  daily and will follow up shortly.

## 2016-12-16 ENCOUNTER — Ambulatory Visit (INDEPENDENT_AMBULATORY_CARE_PROVIDER_SITE_OTHER): Payer: Medicare PPO | Admitting: Family Medicine

## 2016-12-16 VITALS — BP 128/70 | HR 95 | Temp 98.4°F | Wt 167.0 lb

## 2016-12-16 DIAGNOSIS — R21 Rash and other nonspecific skin eruption: Secondary | ICD-10-CM | POA: Diagnosis not present

## 2016-12-16 DIAGNOSIS — R300 Dysuria: Secondary | ICD-10-CM | POA: Diagnosis not present

## 2016-12-16 DIAGNOSIS — N184 Chronic kidney disease, stage 4 (severe): Secondary | ICD-10-CM

## 2016-12-16 DIAGNOSIS — Z7901 Long term (current) use of anticoagulants: Secondary | ICD-10-CM

## 2016-12-16 LAB — POCT INR: INR: 1.2

## 2016-12-16 MED ORDER — TORSEMIDE 100 MG PO TABS: 50.0000 mg | ORAL_TABLET | Freq: Every day | ORAL | 0 refills | Status: DC

## 2016-12-16 MED ORDER — TRIAMCINOLONE ACETONIDE 0.1 % EX CREA: 1.0000 "application " | TOPICAL_CREAM | Freq: Two times a day (BID) | CUTANEOUS | 0 refills | Status: DC

## 2016-12-16 NOTE — Progress Notes (Signed)
[name] is a 83 y.o. female who presents to [name]: Primary Care Sports Medicine today for swelling INR urinary tract infection and rash.  Edema: Patient has been battling edema recently. This is thought to be due to diastolic heart failure and kidney disease. She previously was taking torsemide 50 mg daily. This was decreased in a hope to help spare kidney function. Since then she's gained weight and had worsening leg swelling. The nurse at her living facility heard crackles on her lung exam today. Patient has been taking torsemide 20 mg. She notes that she does not use much with this medicine. She denies severe shortness of breath chest pain or palpitations.  Urinary tract infection: Patient was recently found to also have a urinary tract infection. She is prescribed ciprofloxacin due to drug allergies and sensitivities. Since then she's developed a rash on her left arm and the Cipro was stopped. She denies any urinary frequency urgency or dysuria.  Rash: As noted above. The rash has not spread since patient stopped Cipro.  INR: Patient has had difficult to control INR recently. She was started on diltiazem for age for fibrillation with RVR and has had supratherapeutic INRs recently. She was recently decreased from 3 mg daily to 1 mg daily. She denies any bleeding.   Past Medical History:  Diagnosis Date  . Acute pancreatitis   . Anemia   . Arthritis   . Atrial fibrillation (HCC)   . CAD (coronary artery disease)   . Carotid artery occlusion   . CHF (congestive heart failure) (HCC)   . Diabetes mellitus age 26  . DJD (degenerative joint disease)   . DVT (deep venous thrombosis) (HCC)   . GERD (gastroesophageal reflux disease)   . GI bleed   . Hyperlipidemia   . Hypertension   . Joint pain   . Mesenteric ischemia   . Peripheral vascular disease (HCC)   . Thyroid disease    Past  Surgical History:  Procedure Laterality Date  . APPENDECTOMY    . CELIAC ARTERY STENT    . CORONARY ARTERY BYPASS GRAFT    . EMBOLECTOMY  2009   right leg  . FASCIOTOMY     right leg  . HIP FRACTURE SURGERY Left   . JOINT REPLACEMENT    . TOTAL KNEE ARTHROPLASTY     bilateral  . TUBAL LIGATION    . VISCERAL ANGIOGRAM N/A 05/31/2013   Procedure: MESSENTERIC [name];  Surgeon: [name], MD;  Location: [facility] CATH LAB;  Service: Cardiovascular;  Laterality: N/A;   Social History  Substance Use Topics  . Smoking status: Never Smoker  . Smokeless tobacco: Never Used  . Alcohol use No   family history includes Cancer in her daughter and mother; Coronary artery disease in her sister; Diabetes in her daughter, sister, and son; Heart disease in her sister; Hyperlipidemia in her son; Hypertension in her daughter and son; Other in her father and sister.  ROS as above:  Medications: Current Outpatient Prescriptions  Medication Sig Dispense Refill  . alendronate (FOSAMAX) 70 MG tablet Take 1 tablet (70 mg total) by mouth every Sunday. 12 tablet 1  . Calcium Carb-Cholecalciferol 600-800 MG-UNIT TABS TAKE ONE TABLET BY MOUTH 2 TIMES A DAY 60 tablet 6  . clonazePAM (KLONOPIN) 0.5 MG tablet Take 1 tablet (0.5 mg total) by mouth at bedtime. 90 tablet 1  . diltiazem (CARDIZEM CD) 240 MG 24 hr capsule Take 1  capsule (240 mg total) by mouth at bedtime. 90 capsule 1  . Ergocalciferol (VITAMIN D2) 2000 units TABS Take 2,000 Units by mouth daily.     Marland Kitchen FERREX 150 150 MG capsule TAKE ONE CAPSULE BY MOUTH EVERY DAY (Patient taking differently: TAKE 150 MG BY MOUTH EVERY DAY) 28 capsule 2  . glucose blood test strip Once daily E11.29 100 each 12  . leflunomide (ARAVA) 10 MG tablet Take 1 tablet (10 mg total) by mouth at bedtime. 90 tablet 1  . levothyroxine (SYNTHROID, LEVOTHROID) 50 MCG tablet Take 1 tablet (50 mcg total) by mouth daily. 90 tablet 1  . metoprolol succinate (TOPROL-XL) 50 MG 24 hr  tablet Take 1 tablet (50 mg total) by mouth daily. 90 tablet 3  . Multiple Vitamin (MULTIVITAMIN WITH MINERALS) TABS tablet Take 1 tablet by mouth daily.    . pantoprazole (PROTONIX) 40 MG tablet Take 1 tablet (40 mg total) by mouth daily. 90 tablet 1  . pravastatin (PRAVACHOL) 40 MG tablet Take 1 tablet (40 mg total) by mouth daily. 90 tablet 1  . predniSONE (DELTASONE) 5 MG tablet Take 1 or 2 tablets by mouth two times a day as needed for rheumatoid flair. 180 tablet 0  . STOOL SOFTENER 100 MG capsule TAKE 1 CAPSULE BY MOUTH 2 TIMES A DAY (AM & PM) (Patient taking differently: TAKE 100 MG BY MOUTH 2 TIMES A DAY (AM & PM)) 56 capsule 0  . sulfamethoxazole-trimethoprim (BACTRIM) 400-80 MG tablet Take 1 tablet by mouth 2 (two) times daily. 10 tablet 0  . torsemide (DEMADEX) 100 MG tablet Take 0.5 tablets (50 mg total) by mouth daily. 45 tablet 0  . traMADol (ULTRAM) 50 MG tablet Take 1 tablet (50 mg total) by mouth 2 (two) times daily as needed. Please fax to [facility]. 180 tablet 1  . traZODone (DESYREL) 50 MG tablet Take 0.5-1 tablets (25-50 mg total) by mouth at bedtime as needed for sleep. 90 tablet 0  . triamcinolone cream (KENALOG) 0.1 % Apply 1 application topically 2 (two) times daily. 453.6 g 0  . vitamin B-12 (CYANOCOBALAMIN) 1000 MCG tablet Take 1,000 mcg by mouth daily.    Marland Kitchen warfarin (COUMADIN) 1 MG tablet Take 1mg  daily 30 tablet 0   No current facility-administered medications for this visit.    Allergies  Allergen Reactions  . Cefuroxime Anaphylaxis and Other (See Comments)    Throat swelling  . Fish Allergy Shortness Of Breath    Pt said she has throat swelling   . Cephalexin Rash  . Ciprofloxacin Rash    Health Maintenance Health Maintenance  Topic Date Due  . URINE MICROALBUMIN  01/22/1940  . OPHTHALMOLOGY EXAM  07/13/2016  . HEMOGLOBIN A1C  05/22/2017  . FOOT EXAM  07/26/2017  . TETANUS/TDAP  12/28/2025  . INFLUENZA VACCINE  Completed  . PNA vac Low Risk Adult   Completed     Exam:  BP 128/70   Pulse 95   Temp 98.4 F (36.9 C) (Oral)   Wt 167 lb (75.8 kg)   SpO2 95%   BMI 30.54 kg/m   Wt Readings from Last 10 Encounters:  12/16/16 167 lb (75.8 kg)  12/08/16 164 lb (74.4 kg)  12/01/16 158 lb (71.7 kg)  11/23/16 157 lb 12.8 oz (71.6 kg)  11/22/16 152 lb (68.9 kg)  11/20/16 155 lb 14.4 oz (70.7 kg)  11/17/16 168 lb (76.2 kg)  11/15/16 160 lb (72.6 kg)  11/02/16 162 lb (73.5 kg)  10/22/16 154  lb 5.2 oz (70 kg)    Gen: Well NAD Nontoxic appearing HEENT: EOMI,  MMM Lungs: Normal work of breathing. CTABL Heart: RRR no MRG Abd: NABS, Soft. Nondistended, Nontender Exts: Brisk capillary refill, warm and well perfused. Pitting edema bilateral lower extremities Skin: Erythema left forearm and antecubital fossa with no induration or tenderness.   Results for orders placed or performed in visit on 12/16/16 (from the past 72 hour(s))  POCT INR     Status: None   Collection Time: 12/16/16 12:18 PM  Result Value Ref Range   INR 1.2    No results found.    Assessment and Plan: 83 y.o. female with  Edema and weight gain likely related to fluid retention. Plan to increase torsemide back to 50 mg. We'll check renal function panel as well. Recheck Monday. Patient does not have significant pulmonary symptoms today.  Urinary tract infection: Patient had incomplete treatment with Cipro. We'll recheck urine culture to see if this is been fully treated if not will switch to Bactrim.  Anticoagulation: INR is now subtherapeutic. Plan to increase to 2 mg Tuesdays Thursdays and Saturdays and continue 1 mg Monday Wednesday Friday and Sunday. Recheck Monday   Orders Placed This Encounter  Procedures  . Urine Culture  . Renal Function Panel  . POCT INR   Meds ordered this encounter  Medications  . triamcinolone cream (KENALOG) 0.1 %    Sig: Apply 1 application topically 2 (two) times daily.    Dispense:  453.6 g    Refill:  0  . torsemide  (DEMADEX) 100 MG tablet    Sig: Take 0.5 tablets (50 mg total) by mouth daily.    Dispense:  45 tablet    Refill:  0     Discussed warning signs or symptoms. Please see discharge instructions. Patient expresses understanding.

## 2016-12-16 NOTE — Telephone Encounter (Signed)
Referral to nephrology placed. Will search around to find somebody nearby

## 2016-12-16 NOTE — Telephone Encounter (Signed)
Pt was seen in clinic today. See office note.

## 2016-12-16 NOTE — Telephone Encounter (Signed)
Ann from [facility] is with the patient today and she adv py has gained 5 pounds since yesterday 12/15/16 and would like to speak with Dr. Denyse Amass nurse or a nurse. [phone]. Ann spoke with [facility] on 12/13/16 but she has some swelling and crackling in lungs as well. Please call asap.

## 2016-12-16 NOTE — Patient Instructions (Addendum)
Thank you for coming in today. Increase torsemide to 50mg  daily.  This is 2.5 of the 20mg  pills.  I will send in a new prescription    If you have a UTI we will start the Bactrim medicine via phone call.   Use the cream on the rash.  Do not take Cipro anymore.    For warfarin increase the dose to 2 mg (2 of the 1mg  pills) on Tuesday, Thursday and Saturday. Take 1mg  on the other days of the week.    Follow up Monday.

## 2016-12-16 NOTE — Telephone Encounter (Signed)
Referral coordinator advised.

## 2016-12-17 LAB — RENAL FUNCTION PANEL
ALBUMIN MSPROF: 3.3 g/dL — AB (ref 3.6–5.1)
BUN/Creatinine Ratio: 15 (calc) (ref 6–22)
BUN: 19 mg/dL (ref 7–25)
CALCIUM: 9 mg/dL (ref 8.6–10.4)
CHLORIDE: 102 mmol/L (ref 98–110)
CO2: 29 mmol/L (ref 20–32)
Creat: 1.23 mg/dL — ABNORMAL HIGH (ref 0.60–0.88)
GLUCOSE: 105 mg/dL — AB (ref 65–99)
PHOSPHORUS: 3 mg/dL (ref 2.1–4.3)
Potassium: 4.1 mmol/L (ref 3.5–5.3)
Sodium: 140 mmol/L (ref 135–146)

## 2016-12-18 LAB — URINE CULTURE
MICRO NUMBER: 80986686
SPECIMEN QUALITY:: ADEQUATE

## 2016-12-19 ENCOUNTER — Ambulatory Visit (INDEPENDENT_AMBULATORY_CARE_PROVIDER_SITE_OTHER): Payer: Medicare PPO | Admitting: Family Medicine

## 2016-12-19 ENCOUNTER — Encounter: Payer: Self-pay | Admitting: Family Medicine

## 2016-12-19 VITALS — BP 145/72 | HR 79 | Ht 62.0 in | Wt 167.0 lb

## 2016-12-19 DIAGNOSIS — N183 Chronic kidney disease, stage 3 unspecified: Secondary | ICD-10-CM

## 2016-12-19 DIAGNOSIS — N952 Postmenopausal atrophic vaginitis: Secondary | ICD-10-CM

## 2016-12-19 DIAGNOSIS — I4891 Unspecified atrial fibrillation: Secondary | ICD-10-CM | POA: Diagnosis not present

## 2016-12-19 DIAGNOSIS — R319 Hematuria, unspecified: Secondary | ICD-10-CM

## 2016-12-19 DIAGNOSIS — N390 Urinary tract infection, site not specified: Secondary | ICD-10-CM

## 2016-12-19 DIAGNOSIS — I4819 Other persistent atrial fibrillation: Secondary | ICD-10-CM

## 2016-12-19 DIAGNOSIS — I481 Persistent atrial fibrillation: Secondary | ICD-10-CM

## 2016-12-19 DIAGNOSIS — I5033 Acute on chronic diastolic (congestive) heart failure: Secondary | ICD-10-CM

## 2016-12-19 LAB — POCT INR: INR: 1.2

## 2016-12-19 MED ORDER — ESTROGENS, CONJUGATED 0.625 MG/GM VA CREA: 1.0000 | TOPICAL_CREAM | Freq: Every day | VAGINAL | 12 refills | Status: DC

## 2016-12-19 MED ORDER — AMBULATORY NON FORMULARY MEDICATION: 0 refills | Status: DC

## 2016-12-19 NOTE — Patient Instructions (Signed)
Thank you for coming in today. We will get [facility] to check INR weekly.  If they cannot please get  Nurse visit in 2 weeks for INR check.  Continue warfarin.   I do recommend seeing a Kidney Doctor.   Recheck with me in 1 month.

## 2016-12-19 NOTE — Progress Notes (Signed)
[name] is a 83 y.o. female who presents to [name]: Primary Care Sports Medicine today for follow-up kidney disease anticoagulation leg swelling and chronic urinary tract infection.  Chronic kidney disease: Patient has stage III chronic kidney disease and has had labs recently that showed worsening. She was recently ill and hospitalized for age for fibrillation with RVR twice following pneumonia. Her most recent lab check showed worsening GFR. Patient notes that she seems to be feeling well. She was referred to nephrology but did not yet go.  Leg swelling: Patient is taking torsemide 50 mg daily now. She notes that at 50 mg she does urinate some. She notes leg swelling is slightly improved. She denies significant shortness of breath or significant dyspnea.  Anticoagulation: Patient had previously well controlled INR but with the addition of diltiazem her INR has not been well controlled. She currently takes 1 mg of warfarin on Monday Wednesday Friday Sunday and 2 mg on Tuesday Thursday Saturday. She just increased the dose on Saturday. She denies any bleeding.  Frequent urinary tract infections: Patient has frequent urinary tract infections and recently had incomplete treatment of a urinary tract infection. She feels well and denies any dysuria frequency urgency. Patient notes that she did well with Premarin vaginal cream in the past and would like to restart this if possible.   Past Medical History:  Diagnosis Date  . Acute pancreatitis   . Anemia   . Arthritis   . Atrial fibrillation (HCC)   . CAD (coronary artery disease)   . Carotid artery occlusion   . CHF (congestive heart failure) (HCC)   . Diabetes mellitus age 28  . DJD (degenerative joint disease)   . DVT (deep venous thrombosis) (HCC)   . GERD (gastroesophageal reflux disease)   . GI bleed   . Hyperlipidemia   .  Hypertension   . Joint pain   . Mesenteric ischemia   . Peripheral vascular disease (HCC)   . Thyroid disease    Past Surgical History:  Procedure Laterality Date  . APPENDECTOMY    . CELIAC ARTERY STENT    . CORONARY ARTERY BYPASS GRAFT    . EMBOLECTOMY  2009   right leg  . FASCIOTOMY     right leg  . HIP FRACTURE SURGERY Left   . JOINT REPLACEMENT    . TOTAL KNEE ARTHROPLASTY     bilateral  . TUBAL LIGATION    . VISCERAL ANGIOGRAM N/A 05/31/2013   Procedure: MESSENTERIC [name];  Surgeon: [name], MD;  Location: [facility] CATH LAB;  Service: Cardiovascular;  Laterality: N/A;   Social History  Substance Use Topics  . Smoking status: Never Smoker  . Smokeless tobacco: Never Used  . Alcohol use No   family history includes Cancer in her daughter and mother; Coronary artery disease in her sister; Diabetes in her daughter, sister, and son; Heart disease in her sister; Hyperlipidemia in her son; Hypertension in her daughter and son; Other in her father and sister.  ROS as above:  Medications: Current Outpatient Prescriptions  Medication Sig Dispense Refill  . alendronate (FOSAMAX) 70 MG tablet Take 1 tablet (70 mg total) by mouth every Sunday. 12 tablet 1  . Calcium Carb-Cholecalciferol 600-800 MG-UNIT TABS TAKE ONE TABLET BY MOUTH 2 TIMES A DAY 60 tablet 6  . clonazePAM (KLONOPIN) 0.5 MG tablet Take 1 tablet (0.5 mg total) by mouth at bedtime. 90 tablet 1  . diltiazem (CARDIZEM  CD) 240 MG 24 hr capsule Take 1 capsule (240 mg total) by mouth at bedtime. 90 capsule 1  . Ergocalciferol (VITAMIN D2) 2000 units TABS Take 2,000 Units by mouth daily.     Marland Kitchen FERREX 150 150 MG capsule TAKE ONE CAPSULE BY MOUTH EVERY DAY (Patient taking differently: TAKE 150 MG BY MOUTH EVERY DAY) 28 capsule 2  . glucose blood test strip Once daily E11.29 100 each 12  . leflunomide (ARAVA) 10 MG tablet Take 1 tablet (10 mg total) by mouth at bedtime. 90 tablet 1  . levothyroxine (SYNTHROID,  LEVOTHROID) 50 MCG tablet Take 1 tablet (50 mcg total) by mouth daily. 90 tablet 1  . metoprolol succinate (TOPROL-XL) 50 MG 24 hr tablet Take 1 tablet (50 mg total) by mouth daily. 90 tablet 3  . Multiple Vitamin (MULTIVITAMIN WITH MINERALS) TABS tablet Take 1 tablet by mouth daily.    . pantoprazole (PROTONIX) 40 MG tablet Take 1 tablet (40 mg total) by mouth daily. 90 tablet 1  . pravastatin (PRAVACHOL) 40 MG tablet Take 1 tablet (40 mg total) by mouth daily. 90 tablet 1  . predniSONE (DELTASONE) 5 MG tablet Take 1 or 2 tablets by mouth two times a day as needed for rheumatoid flair. 180 tablet 0  . STOOL SOFTENER 100 MG capsule TAKE 1 CAPSULE BY MOUTH 2 TIMES A DAY (AM & PM) (Patient taking differently: TAKE 100 MG BY MOUTH 2 TIMES A DAY (AM & PM)) 56 capsule 0  . sulfamethoxazole-trimethoprim (BACTRIM) 400-80 MG tablet Take 1 tablet by mouth 2 (two) times daily. 10 tablet 0  . torsemide (DEMADEX) 100 MG tablet Take 0.5 tablets (50 mg total) by mouth daily. 45 tablet 0  . traMADol (ULTRAM) 50 MG tablet Take 1 tablet (50 mg total) by mouth 2 (two) times daily as needed. Please fax to [facility]. 180 tablet 1  . traZODone (DESYREL) 50 MG tablet Take 0.5-1 tablets (25-50 mg total) by mouth at bedtime as needed for sleep. 90 tablet 0  . triamcinolone cream (KENALOG) 0.1 % Apply 1 application topically 2 (two) times daily. 453.6 g 0  . vitamin B-12 (CYANOCOBALAMIN) 1000 MCG tablet Take 1,000 mcg by mouth daily.    Marland Kitchen warfarin (COUMADIN) 1 MG tablet Take 1mg  daily 30 tablet 0  . AMBULATORY NON FORMULARY MEDICATION Please check INR weekly for 2 months. Send reports to Dr fax [phone] 1 each 0  . conjugated estrogens (PREMARIN) vaginal cream Place 1 Applicatorful vaginally daily. 42.5 g 12   No current facility-administered medications for this visit.    Allergies  Allergen Reactions  . Cefuroxime Anaphylaxis and Other (See Comments)    Throat swelling  . Fish Allergy Shortness Of Breath     Pt said she has throat swelling   . Cephalexin Rash  . Ciprofloxacin Rash    Health Maintenance Health Maintenance  Topic Date Due  . URINE MICROALBUMIN  01/22/1940  . OPHTHALMOLOGY EXAM  07/13/2016  . HEMOGLOBIN A1C  05/22/2017  . FOOT EXAM  07/26/2017  . TETANUS/TDAP  12/28/2025  . INFLUENZA VACCINE  Completed  . PNA vac Low Risk Adult  Completed     Exam:  BP (!) 145/72   Pulse 79   Ht 5\' 2"  (1.575 m)   Wt 167 lb 0.6 oz (75.8 kg)   SpO2 96%   BMI 30.55 kg/m  Gen: Well NAD HEENT: EOMI,  MMM Lungs: Normal work of breathing. CTABL Heart: RRR no MRG Abd: NABS, Soft.  Nondistended, Nontender Exts: Brisk capillary refill, warm and well perfused. 1+ edema bilateral lower extremities    Chemistry      Component Value Date/Time   NA 140 12/16/2016 1221   NA 138 05/19/2014   K 4.1 12/16/2016 1221   CL 102 12/16/2016 1221   CO2 29 12/16/2016 1221   BUN 19 12/16/2016 1221   BUN 37 (A) 05/19/2014   CREATININE 1.23 (H) 12/16/2016 1221   GLU 136 02/13/2015      Component Value Date/Time   CALCIUM 9.0 12/16/2016 1221   ALKPHOS 94 12/08/2016 1438   AST 17 12/08/2016 1438   ALT 14 12/08/2016 1438   BILITOT 0.7 12/08/2016 1438        Results for orders placed or performed in visit on 12/19/16 (from the past 72 hour(s))  POCT INR     Status: None   Collection Time: 12/19/16  3:04 PM  Result Value Ref Range   INR 1.2    No results found.    Assessment and Plan: 83 y.o. female with  Chronic kidney disease: Creatinine improved to baseline. I still think it's reasonable to follow-up with nephrology. Recheck with me in one month.  Leg swelling: Patient seems to be improving. Continue torsemide 50 mg daily. Agree with referral to urology.  Frequent urinary tract infection: We'll recheck urine culture. Last urine culture obtained on Friday was significant for multiple morphotypes. I'm reluctant to use chronic antibiotics as patient has multiple drug allergies. Agree to  restart Premarin vaginal cream and recheck in 1 month.  Anticoagulation: INR not at goal. Plan to recheck with home health in one week.   Orders Placed This Encounter  Procedures  . Urine Culture  . POCT INR   Meds ordered this encounter  Medications  . AMBULATORY NON FORMULARY MEDICATION    Sig: Please check INR weekly for 2 months. Send reports to Dr Denyse Amass fax [phone]    Dispense:  1 each    Refill:  0  . conjugated estrogens (PREMARIN) vaginal cream    Sig: Place 1 Applicatorful vaginally daily.    Dispense:  42.5 g    Refill:  12     Discussed warning signs or symptoms. Please see discharge instructions. Patient expresses understanding.

## 2016-12-20 LAB — URINE CULTURE
MICRO NUMBER: 80994141
SPECIMEN QUALITY: ADEQUATE

## 2016-12-21 ENCOUNTER — Ambulatory Visit: Payer: Medicare PPO | Admitting: Family Medicine

## 2016-12-22 ENCOUNTER — Other Ambulatory Visit: Payer: Self-pay | Admitting: *Deleted

## 2016-12-22 DIAGNOSIS — I482 Chronic atrial fibrillation, unspecified: Secondary | ICD-10-CM

## 2016-12-22 MED ORDER — METOPROLOL SUCCINATE ER 50 MG PO TB24
50.0000 mg | ORAL_TABLET | Freq: Every day | ORAL | 3 refills | Status: DC
Start: 2016-12-22 — End: 2017-05-24

## 2016-12-26 ENCOUNTER — Other Ambulatory Visit: Payer: Self-pay

## 2016-12-26 ENCOUNTER — Other Ambulatory Visit: Payer: Self-pay | Admitting: *Deleted

## 2016-12-26 MED ORDER — FOLIC ACID 1 MG PO TABS: 1.0000 mg | ORAL_TABLET | Freq: Every day | ORAL | 3 refills | Status: DC

## 2016-12-26 MED ORDER — POLYSACCHARIDE IRON COMPLEX 150 MG PO CAPS: 150.0000 mg | ORAL_CAPSULE | Freq: Every day | ORAL | 3 refills | Status: DC

## 2016-12-28 ENCOUNTER — Telehealth: Payer: Self-pay | Admitting: Cardiology

## 2016-12-28 NOTE — Telephone Encounter (Signed)
New Message      Pt c/o medication issue:  1. Name of Medication:  metoprolol succinate (TOPROL-XL) 50 MG 24 hr tablet Take 1 tablet (50 mg total)      2. How are you currently taking this medication (dosage and times per day)?  1 tablet   3. Are you having a reaction (difficulty breathing--STAT)?  No   4. What is your medication issue?  bp  96/46  83/54 3p  12/28/16 107/41  Heart rate is in the 60-70 rate

## 2016-12-28 NOTE — Telephone Encounter (Signed)
Darvin Neighbours (daughter) [id number]

## 2016-12-29 MED ORDER — DILTIAZEM HCL ER COATED BEADS 180 MG PO CP24: 180.0000 mg | ORAL_CAPSULE | Freq: Every day | ORAL | 3 refills | Status: DC

## 2016-12-29 MED ORDER — DILTIAZEM HCL ER COATED BEADS 180 MG PO CP24: 180.0000 mg | ORAL_CAPSULE | Freq: Every day | ORAL | 0 refills | Status: DC

## 2016-12-29 NOTE — Telephone Encounter (Signed)
Spoke with pt dtr, she is very concerned about the bp being low. Discussed with dr hochrein DOD, patient will keep the metoprolol dose the same and will decrease diltiazem to 180 mg daily. New script sent to the pharmacy, she will let us know if bp does not improve.

## 2016-12-29 NOTE — Telephone Encounter (Signed)
Patient also on diltiazem 240 mg qd.  Might want to consider decreasing this dose, as it may have more impact on BP than the metoprolol 50 mg.

## 2017-01-02 ENCOUNTER — Telehealth: Payer: Self-pay | Admitting: Cardiology

## 2017-01-02 DIAGNOSIS — N184 Chronic kidney disease, stage 4 (severe): Secondary | ICD-10-CM | POA: Diagnosis not present

## 2017-01-02 DIAGNOSIS — E785 Hyperlipidemia, unspecified: Secondary | ICD-10-CM | POA: Diagnosis not present

## 2017-01-02 DIAGNOSIS — D631 Anemia in chronic kidney disease: Secondary | ICD-10-CM | POA: Diagnosis not present

## 2017-01-02 DIAGNOSIS — E1129 Type 2 diabetes mellitus with other diabetic kidney complication: Secondary | ICD-10-CM | POA: Diagnosis not present

## 2017-01-02 DIAGNOSIS — Z6830 Body mass index (BMI) 30.0-30.9, adult: Secondary | ICD-10-CM | POA: Diagnosis not present

## 2017-01-02 DIAGNOSIS — N2581 Secondary hyperparathyroidism of renal origin: Secondary | ICD-10-CM | POA: Diagnosis not present

## 2017-01-02 DIAGNOSIS — I739 Peripheral vascular disease, unspecified: Secondary | ICD-10-CM | POA: Diagnosis not present

## 2017-01-02 DIAGNOSIS — I129 Hypertensive chronic kidney disease with stage 1 through stage 4 chronic kidney disease, or unspecified chronic kidney disease: Secondary | ICD-10-CM | POA: Diagnosis not present

## 2017-01-02 DIAGNOSIS — I2510 Atherosclerotic heart disease of native coronary artery without angina pectoris: Secondary | ICD-10-CM | POA: Diagnosis not present

## 2017-01-02 NOTE — Telephone Encounter (Signed)
Pt of Dr. [name] Hx PAF, HF, PVD, CKD  Spoke to [place].  Notes recent changes to patient's medications (cardizem CDreduced from 240 to 180mg  daily)  Since then, HR and BP have been stable However, pt has been "real short of breath" last few days. Notes she's had some amt of shortness of breath on & off for several weeks (since first seen by Dr. )  Her O2 level is 89-93% where usually 94-96%  Daughter reports "she had some extra fluid" over weekend, an extra 20mg  torsemide was given Saturday and now her weight is "stable".  However, reports still having some swelling in legs and arms. States pt has occasional cough. Has niece who is . Lung bases sound "raspy" according to niece's assessment. Denies fever, persistent productive cough  Patient has appt w nephrology today @ 10:15am. Advised daughter to allow them to assess her for fluid overload at this visit and give recommendations regarding the torsemide - they may advise OK to give further PRN doses. Informed her I would also route to Dr. Wednesday for suggestions.

## 2017-01-02 NOTE — Telephone Encounter (Signed)
[name]( Daughter) is calling about [name] because she is very short of breath , blood pressure is good and heart rate is good . But is still very short of breath , her oxygen levels is between 89-93 . Not Sure if she should come in or what Dr. [name] may want her to do . Please call

## 2017-01-02 NOTE — Telephone Encounter (Signed)
Left msg for [name] to call if she wishes to update [place] on pt visit this AM or discuss care w [place] further.

## 2017-01-02 NOTE — Telephone Encounter (Signed)
Agree with plan Dajanae Brophy  

## 2017-01-02 NOTE — Progress Notes (Signed)
HPI: FU atrial fibrillation chronic diastolic CHF and CAD. Patient did have coronary artery bypass graft in 2009 with a LIMA to the LAD, saphenous vein graft to OM1 and OM3 and saphenous vein graft to the marginal. Echocardiogram August 2018 showed normal LV systolic function, severe left ventricular hypertrophy, mild aortic stenosis, moderate mitral regurgitation, biatrial enlargement, mild right ventricular enlargement and mild tricuspid regurgitation. Patient previously hospitalized and noted to be in recurrent atrial fibrillation. Plan was rate control and anticoagulation. It was noted that she was bradycardic with combination of Cardizem 360 mg daily and metoprolol 25 mg daily.  Holter monitor August 2018 showed atrial fibrillation with PVCs or aberrantly conducted beats and rate upper normal. Toprol increased to 50 mg daily. Since last seen, Patient was seen by nephrology last week and her Demadex increased to 50 mg twice a day. Her dyspnea has improved. She denies orthopnea or PND. Her pedal edema is improving as well. She denies chest pain, palpitations or syncope. No bleeding.  Current Outpatient Prescriptions  Medication Sig Dispense Refill  . alendronate (FOSAMAX) 70 MG tablet Take 1 tablet (70 mg total) by mouth every Sunday. 12 tablet 1  . AMBULATORY NON FORMULARY MEDICATION Please check INR weekly for 2 months. Send reports to Dr Denyse Amass fax [phone] 1 each 0  . Calcium Carb-Cholecalciferol 600-800 MG-UNIT TABS TAKE ONE TABLET BY MOUTH 2 TIMES A DAY 60 tablet 6  . clonazePAM (KLONOPIN) 0.5 MG tablet Take 1 tablet (0.5 mg total) by mouth at bedtime. 90 tablet 1  . conjugated estrogens (PREMARIN) vaginal cream Place 1 Applicatorful vaginally daily. 42.5 g 12  . diltiazem (CARDIZEM CD) 180 MG 24 hr capsule Take 1 capsule (180 mg total) by mouth at bedtime. 90 capsule 3  . Ergocalciferol (VITAMIN D2) 2000 units TABS Take 2,000 Units by mouth daily.     . folic acid (FOLVITE) 1 MG  tablet Take 1 tablet (1 mg total) by mouth daily. 90 tablet 3  . glucose blood test strip Once daily E11.29 100 each 12  . iron polysaccharides (FERREX 150) 150 MG capsule Take 1 capsule (150 mg total) by mouth daily. 90 capsule 3  . leflunomide (ARAVA) 10 MG tablet Take 1 tablet (10 mg total) by mouth at bedtime. 90 tablet 1  . levothyroxine (SYNTHROID, LEVOTHROID) 50 MCG tablet Take 1 tablet (50 mcg total) by mouth daily. 90 tablet 1  . metoprolol succinate (TOPROL-XL) 50 MG 24 hr tablet Take 1 tablet (50 mg total) by mouth daily. 90 tablet 3  . Multiple Vitamin (MULTIVITAMIN WITH MINERALS) TABS tablet Take 1 tablet by mouth daily.    . pantoprazole (PROTONIX) 40 MG tablet Take 1 tablet (40 mg total) by mouth daily. 90 tablet 1  . pravastatin (PRAVACHOL) 40 MG tablet Take 1 tablet (40 mg total) by mouth daily. 90 tablet 1  . predniSONE (DELTASONE) 5 MG tablet Take 1 or 2 tablets by mouth two times a day as needed for rheumatoid flair. 180 tablet 0  . STOOL SOFTENER 100 MG capsule TAKE 1 CAPSULE BY MOUTH 2 TIMES A DAY (AM & PM) (Patient taking differently: TAKE 100 MG BY MOUTH 2 TIMES A DAY (AM & PM)) 56 capsule 0  . sulfamethoxazole-trimethoprim (BACTRIM) 400-80 MG tablet Take 1 tablet by mouth 2 (two) times daily. 10 tablet 0  . torsemide (DEMADEX) 100 MG tablet Take 0.5 tablets (50 mg total) by mouth daily. 45 tablet 0  . traMADol (ULTRAM) 50 MG tablet  Take 1 tablet (50 mg total) by mouth 2 (two) times daily as needed. Please fax to [facility]. 180 tablet 1  . traZODone (DESYREL) 50 MG tablet Take 0.5-1 tablets (25-50 mg total) by mouth at bedtime as needed for sleep. 90 tablet 0  . triamcinolone cream (KENALOG) 0.1 % Apply 1 application topically 2 (two) times daily. 453.6 g 0  . vitamin B-12 (CYANOCOBALAMIN) 1000 MCG tablet Take 1,000 mcg by mouth daily.    Marland Kitchen warfarin (COUMADIN) 1 MG tablet Take 1mg  daily 30 tablet 0   No current facility-administered medications for this visit.      Past  Medical History:  Diagnosis Date  . Acute pancreatitis   . Anemia   . Arthritis   . Atrial fibrillation (HCC)   . CAD (coronary artery disease)   . Carotid artery occlusion   . CHF (congestive heart failure) (HCC)   . Diabetes mellitus age 89  . DJD (degenerative joint disease)   . DVT (deep venous thrombosis) (HCC)   . GERD (gastroesophageal reflux disease)   . GI bleed   . Hyperlipidemia   . Hypertension   . Joint pain   . Mesenteric ischemia   . Peripheral vascular disease (HCC)   . Thyroid disease     Past Surgical History:  Procedure Laterality Date  . APPENDECTOMY    . CELIAC ARTERY STENT    . CORONARY ARTERY BYPASS GRAFT    . EMBOLECTOMY  2009   right leg  . FASCIOTOMY     right leg  . HIP FRACTURE SURGERY Left   . JOINT REPLACEMENT    . TOTAL KNEE ARTHROPLASTY     bilateral  . TUBAL LIGATION    . VISCERAL ANGIOGRAM N/A 05/31/2013   Procedure: MESSENTERIC [name];  Surgeon: [name], MD;  Location: [facility] CATH LAB;  Service: Cardiovascular;  Laterality: N/A;    Social History   Social History  . Marital status: Widowed    Spouse name: N/A  . Number of children: N/A  . Years of education: N/A   Occupational History  . Not on file.   Social History Main Topics  . Smoking status: Never Smoker  . Smokeless tobacco: Never Used  . Alcohol use No  . Drug use: No  . Sexual activity: Not on file   Other Topics Concern  . Not on file   Social History Narrative  . No narrative on file    Family History  Problem Relation Age of Onset  . Cancer Mother        ovarian  . Other Father        suicide  . Coronary artery disease Sister   . Other Sister        alzheimers  . Heart disease Sister        Before age 3  . Diabetes Sister   . Cancer Daughter        spinal tumor  . Diabetes Daughter   . Hypertension Daughter   . Diabetes Son   . Hyperlipidemia Son   . Hypertension Son     ROS: arthritis but no fevers or chills, productive cough,  hemoptysis, dysphasia, odynophagia, melena, hematochezia, dysuria, hematuria, rash, seizure activity, orthopnea, PND, claudication. Remaining systems are negative.  Physical Exam: Well-developed well-nourished in no acute distress.  Skin is warm and dry.  HEENT is normal.  Neck is supple.  Chest is clear to auscultation with normal expansion.  Cardiovascular exam is irregular, 2/6 systolic murmur  LSB Abdominal exam nontender or distended. No masses palpated. Extremities show 1+ edema. neuro grossly intact   A/P  1 atrial fibrillation-patient's heart rate is controlled today. Continue metoprolol and Cardizem. Continue Coumadin. Pt given name of apixaban and will contact us if she would like to change.  2 coronary artery disease status post coronary artery bypass graft-patient is not having chest pain. Continue medical therapy. Continue statin. No aspirin given need for Coumadin.  3 chronic diastolic congestive heart failure-patient is mildly volume overloaded on examination. Her Demadex was increased last week and this is improving. Check potassium and renal function. Continue low sodium diet and fluid restriction.  4 hypertension-blood pressure is controlled. Continue present medications.  5 hyperlipidemia-continue statin.  6 history of valvular heart disease-patient is noted to have mild aortic stenosis and moderate mitral regurgitation on most recent echocardiogram. She will need follow-up studies in the future.   [name], MD

## 2017-01-03 DIAGNOSIS — E11621 Type 2 diabetes mellitus with foot ulcer: Secondary | ICD-10-CM | POA: Diagnosis not present

## 2017-01-03 DIAGNOSIS — L97522 Non-pressure chronic ulcer of other part of left foot with fat layer exposed: Secondary | ICD-10-CM | POA: Diagnosis not present

## 2017-01-03 DIAGNOSIS — E1142 Type 2 diabetes mellitus with diabetic polyneuropathy: Secondary | ICD-10-CM | POA: Diagnosis not present

## 2017-01-06 ENCOUNTER — Other Ambulatory Visit: Payer: Self-pay | Admitting: Nephrology

## 2017-01-06 DIAGNOSIS — N184 Chronic kidney disease, stage 4 (severe): Secondary | ICD-10-CM

## 2017-01-10 ENCOUNTER — Telehealth: Payer: Self-pay | Admitting: Family Medicine

## 2017-01-10 DIAGNOSIS — N184 Chronic kidney disease, stage 4 (severe): Secondary | ICD-10-CM | POA: Diagnosis not present

## 2017-01-10 MED ORDER — TORSEMIDE 100 MG PO TABS: 50.0000 mg | ORAL_TABLET | Freq: Two times a day (BID) | ORAL | 0 refills | Status: DC

## 2017-01-10 NOTE — Telephone Encounter (Signed)
Pt was seen a Nephrology.  Plan to increase torsemide to 50mg  BID.  See scanned document.

## 2017-01-11 ENCOUNTER — Ambulatory Visit (INDEPENDENT_AMBULATORY_CARE_PROVIDER_SITE_OTHER): Payer: Medicare PPO | Admitting: Cardiology

## 2017-01-11 ENCOUNTER — Encounter: Payer: Self-pay | Admitting: Cardiology

## 2017-01-11 VITALS — BP 136/73 | HR 77 | Ht 62.0 in | Wt 163.1 lb

## 2017-01-11 DIAGNOSIS — I2510 Atherosclerotic heart disease of native coronary artery without angina pectoris: Secondary | ICD-10-CM | POA: Diagnosis not present

## 2017-01-11 DIAGNOSIS — I482 Chronic atrial fibrillation, unspecified: Secondary | ICD-10-CM

## 2017-01-11 DIAGNOSIS — I5032 Chronic diastolic (congestive) heart failure: Secondary | ICD-10-CM

## 2017-01-11 DIAGNOSIS — I10 Essential (primary) hypertension: Secondary | ICD-10-CM | POA: Diagnosis not present

## 2017-01-11 DIAGNOSIS — I25810 Atherosclerosis of coronary artery bypass graft(s) without angina pectoris: Secondary | ICD-10-CM

## 2017-01-11 LAB — BASIC METABOLIC PANEL
BUN/Creatinine Ratio: 19 (calc) (ref 6–22)
BUN: 23 mg/dL (ref 7–25)
CHLORIDE: 103 mmol/L (ref 98–110)
CO2: 29 mmol/L (ref 20–32)
CREATININE: 1.23 mg/dL — AB (ref 0.60–0.88)
Calcium: 8.9 mg/dL (ref 8.6–10.4)
Glucose, Bld: 139 mg/dL — ABNORMAL HIGH (ref 65–99)
POTASSIUM: 3.8 mmol/L (ref 3.5–5.3)
SODIUM: 142 mmol/L (ref 135–146)

## 2017-01-11 NOTE — Patient Instructions (Signed)
Medication Instructions:   ELIQUIS FOR BLOOD THINNER  Labwork:  Your physician recommends that you HAVE LAB WORK TODAY  Follow-Up:  Your physician recommends that you schedule a follow-up appointment in: 3 MONTHS WITH DR [name]   If you need a refill on your cardiac medications before your next appointment, please call your pharmacy.

## 2017-01-12 ENCOUNTER — Ambulatory Visit (INDEPENDENT_AMBULATORY_CARE_PROVIDER_SITE_OTHER): Payer: Medicare PPO | Admitting: Family Medicine

## 2017-01-12 ENCOUNTER — Encounter: Payer: Self-pay | Admitting: Family Medicine

## 2017-01-12 VITALS — BP 155/71 | HR 106 | Ht 62.0 in | Wt 163.0 lb

## 2017-01-12 DIAGNOSIS — I481 Persistent atrial fibrillation: Secondary | ICD-10-CM

## 2017-01-12 DIAGNOSIS — I4819 Other persistent atrial fibrillation: Secondary | ICD-10-CM

## 2017-01-12 DIAGNOSIS — I10 Essential (primary) hypertension: Secondary | ICD-10-CM

## 2017-01-12 DIAGNOSIS — N183 Chronic kidney disease, stage 3 unspecified: Secondary | ICD-10-CM

## 2017-01-12 DIAGNOSIS — F321 Major depressive disorder, single episode, moderate: Secondary | ICD-10-CM

## 2017-01-12 MED ORDER — APIXABAN 2.5 MG PO TABS: 2.5000 mg | ORAL_TABLET | Freq: Two times a day (BID) | ORAL | 1 refills | Status: DC

## 2017-01-12 MED ORDER — SERTRALINE HCL 25 MG PO TABS
25.0000 mg | ORAL_TABLET | Freq: Every day | ORAL | 2 refills | Status: DC
Start: 2017-01-12 — End: 2017-01-24

## 2017-01-12 NOTE — Patient Instructions (Addendum)
Thank you for coming in today. STOP Protonix  STOP Fosamax   START Zoloft daily.  START eliquis twice daily and STOP warfarin if affordable.   If you cannot afford Eliquis let me know and I will change the warfarin dose.   Recheck with me in 1 month or sooner if needed.   I will call Maureen Ralphs and let her know what is going on.

## 2017-01-12 NOTE — Progress Notes (Signed)
[name] is a 83 y.o. female who presents to [name] today for follow-up of mood and management of atrial fibrillation.  Depression: Patient is presently grieving over the recent loss of her daughter and son-in-law by suicide. Patient reports that she is doing her best to stay busy and this has helped keep her mind off of things. She lives in a [place] center and has a great support system of friends and staff which she feels comfortable talking to regularly. Patient feels she has good resources available to her if her depression worsens. Patient is not interested in attending formal counseling sessions at this time, but would like to try some medication that might help her depression.  Atrial fibrillation: Patient was recently seen by her Cardiologist Dr. [name]. He recommended discontinuing warfarin and possibly switching to another method of anticoagulation if possible. She occasionally experiences palpitations, but these resolve spontaneously and do not last longer than a minute or two at a time. Patient denies any lightheadedness, dizziness, or pre-syncope. Patient does not feel any unsteadiness or worry about falling. She had her INR checked on October 3 and it was 1.1.   Past Medical History:  Diagnosis Date  . Acute pancreatitis   . Anemia   . Arthritis   . Atrial fibrillation (HCC)   . CAD (coronary artery disease)   . Carotid artery occlusion   . CHF (congestive heart failure) (HCC)   . Diabetes mellitus age 73  . DJD (degenerative joint disease)   . DVT (deep venous thrombosis) (HCC)   . GERD (gastroesophageal reflux disease)   . GI bleed   . Hyperlipidemia   . Hypertension   . Joint pain   . Mesenteric ischemia   . Peripheral vascular disease (HCC)   . Thyroid disease    Past Surgical History:  Procedure Laterality Date  . APPENDECTOMY     . CELIAC ARTERY STENT    . CORONARY ARTERY BYPASS GRAFT    . EMBOLECTOMY  2009   right leg  . FASCIOTOMY     right leg  . HIP FRACTURE SURGERY Left   . JOINT REPLACEMENT    . TOTAL KNEE ARTHROPLASTY     bilateral  . TUBAL LIGATION    . VISCERAL ANGIOGRAM N/A 05/31/2013   Procedure: MESSENTERIC [name];  Surgeon: [name], MD;  Location: [facility] CATH LAB;  Service: Cardiovascular;  Laterality: N/A;   Social History  Substance Use Topics  . Smoking status: Never Smoker  . Smokeless tobacco: Never Used  . Alcohol use No   family history includes Cancer in her daughter and mother; Coronary artery disease in her sister; Diabetes in her daughter, sister, and son; Heart disease in her sister; Hyperlipidemia in her son; Hypertension in her daughter and son; Other in her father and sister.  ROS as above:  Medications: Current Outpatient Prescriptions  Medication Sig Dispense Refill  . AMBULATORY NON FORMULARY MEDICATION Please check INR weekly for 2 months. Send reports to Dr Denyse Amass fax [phone] 1 each 0  . Calcium Carb-Cholecalciferol 600-800 MG-UNIT TABS TAKE ONE TABLET BY MOUTH 2 TIMES A DAY 60 tablet 6  . clonazePAM (KLONOPIN) 0.5 MG tablet Take 1 tablet (0.5 mg total) by mouth at bedtime. 90 tablet 1  . conjugated estrogens (PREMARIN) vaginal cream Place 1 Applicatorful vaginally daily. 42.5 g 12  . diltiazem (CARDIZEM CD) 180 MG 24 hr capsule Take 1 capsule (180 mg  total) by mouth at bedtime. 90 capsule 3  . Ergocalciferol (VITAMIN D2) 2000 units TABS Take 2,000 Units by mouth daily.     . folic acid (FOLVITE) 1 MG tablet Take 1 tablet (1 mg total) by mouth daily. 90 tablet 3  . glucose blood test strip Once daily E11.29 100 each 12  . iron polysaccharides (FERREX 150) 150 MG capsule Take 1 capsule (150 mg total) by mouth daily. 90 capsule 3  . leflunomide (ARAVA) 10 MG tablet Take 1 tablet (10 mg total) by mouth at bedtime. 90 tablet 1  . levothyroxine (SYNTHROID,  LEVOTHROID) 50 MCG tablet Take 1 tablet (50 mcg total) by mouth daily. 90 tablet 1  . metoprolol succinate (TOPROL-XL) 50 MG 24 hr tablet Take 1 tablet (50 mg total) by mouth daily. 90 tablet 3  . pravastatin (PRAVACHOL) 40 MG tablet Take 1 tablet (40 mg total) by mouth daily. 90 tablet 1  . predniSONE (DELTASONE) 5 MG tablet Take 1 or 2 tablets by mouth two times a day as needed for rheumatoid flair. 180 tablet 0  . STOOL SOFTENER 100 MG capsule TAKE 1 CAPSULE BY MOUTH 2 TIMES A DAY (AM & PM) (Patient taking differently: TAKE 100 MG BY MOUTH 2 TIMES A DAY (AM & PM)) 56 capsule 0  . torsemide (DEMADEX) 100 MG tablet Take 0.5 tablets (50 mg total) by mouth 2 (two) times daily. 45 tablet 0  . traMADol (ULTRAM) 50 MG tablet Take 1 tablet (50 mg total) by mouth 2 (two) times daily as needed. Please fax to [facility]. 180 tablet 1  . traZODone (DESYREL) 50 MG tablet Take 0.5-1 tablets (25-50 mg total) by mouth at bedtime as needed for sleep. 90 tablet 0  . vitamin B-12 (CYANOCOBALAMIN) 1000 MCG tablet Take 1,000 mcg by mouth daily.    Marland Kitchen warfarin (COUMADIN) 1 MG tablet Take 1mg  daily 30 tablet 0  . apixaban (ELIQUIS) 2.5 MG TABS tablet Take 1 tablet (2.5 mg total) by mouth 2 (two) times daily. Take in substitution of warfarin if affordable 60 tablet 1  . sertraline (ZOLOFT) 25 MG tablet Take 1 tablet (25 mg total) by mouth daily. 30 tablet 2   No current facility-administered medications for this visit.    Allergies  Allergen Reactions  . Cefuroxime Anaphylaxis and Other (See Comments)    Throat swelling  . Fish Allergy Shortness Of Breath    Pt said she has throat swelling   . Ciprofloxacin Rash  . Cephalexin Rash    Health Maintenance Health Maintenance  Topic Date Due  . URINE MICROALBUMIN  01/22/1940  . OPHTHALMOLOGY EXAM  07/13/2016  . HEMOGLOBIN A1C  05/22/2017  . FOOT EXAM  07/26/2017  . TETANUS/TDAP  12/28/2025  . INFLUENZA VACCINE  Completed  . PNA vac Low Risk Adult  Completed      Exam:  BP (!) 155/71   Pulse (!) 106   Ht 5\' 2"  (1.575 m)   Wt 163 lb (73.9 kg)   BMI 29.81 kg/m  Gen: Well NAD, sitting comfortably in chair HEENT: EOMI,  MMM Lungs: Normal work of breathing. CTABL Heart: Regular rate and rhythm, II/VI systolic ejection murmur over area of aortic valve Abd: NABS, Soft. Nondistended, Nontender Exts: Brisk capillary refill, warm and well perfused, 1+ pitting edema present bilaterally   Results for orders placed or performed in visit on 01/11/17 (from the past 72 hour(s))  Basic metabolic panel     Status: Abnormal   Collection Time: 01/11/17  3:33 PM  Result Value Ref Range   Glucose, Bld 139 (H) 65 - 99 mg/dL    Comment: .            Fasting reference interval . For someone without known diabetes, a glucose value >125 mg/dL indicates that they may have diabetes and this should be confirmed with a follow-up test. .    BUN 23 7 - 25 mg/dL   Creat 6.73 (H) 4.19 - 0.88 mg/dL    Comment: For patients >50 years of age, the reference limit for Creatinine is approximately 13% higher for people identified as African-American. .    BUN/Creatinine Ratio 19 6 - 22 (calc)   Sodium 142 135 - 146 mmol/L   Potassium 3.8 3.5 - 5.3 mmol/L   Chloride 103 98 - 110 mmol/L   CO2 29 20 - 32 mmol/L   Calcium 8.9 8.6 - 10.4 mg/dL   No results found. GAD 7 : Generalized Anxiety Score 01/12/2017 12/01/2016  Nervous, Anxious, on Edge 3 2  Control/stop worrying 1 0  Worry too much - different things 3 0  Trouble relaxing 1 3  Restless 3 3  Easily annoyed or irritable 1 3  Afraid - awful might happen 3 3  Total GAD 7 Score 15 14   Depression screen [facility] 2/9 01/12/2017 12/01/2016 11/02/2016  Decreased Interest 3 3 0  Down, Depressed, Hopeless 3 3 0  PHQ - 2 Score 6 6 0  Altered sleeping 1 3 -  Tired, decreased energy 2 3 -  Change in appetite 0 0 -  Feeling bad or failure about yourself  0 0 -  Trouble concentrating 3 3 -  Moving slowly or  fidgety/restless 2 0 -  Suicidal thoughts 0 0 -  PHQ-9 Score 14 15 -     Assessment and Plan: 83 y.o. female for follow-up of mood and management of atrial fibrillation. For her mood, it is reasonable to start a low-dose of sertraline. Patient has resources in place if her depression worsens and she is comfortable with her current support system.   Atrial fibrillation: Patient may benefit from anticoagulation with apixiban rather than warfarin. A prescription was sent to her pharmacy. Patient will see if this medication is affordable and if so begin taking it. If this medication is not affordable then patient will follow-up for readjustment of her warfarin regimen.   Patient will follow-up in clinic for reassessment of mood in 1 month or sooner if symptoms worsen.    No orders of the defined types were placed in this encounter.  Meds ordered this encounter  Medications  . apixaban (ELIQUIS) 2.5 MG TABS tablet    Sig: Take 1 tablet (2.5 mg total) by mouth 2 (two) times daily. Take in substitution of warfarin if affordable    Dispense:  60 tablet    Refill:  1  . sertraline (ZOLOFT) 25 MG tablet    Sig: Take 1 tablet (25 mg total) by mouth daily.    Dispense:  30 tablet    Refill:  2     Discussed warning signs or symptoms. Please see discharge instructions. Patient expresses understanding.

## 2017-01-13 ENCOUNTER — Telehealth: Payer: Self-pay | Admitting: *Deleted

## 2017-01-13 NOTE — Telephone Encounter (Signed)
[name], patients daughter  wants to know why the fosamax was stopped.  [phone])

## 2017-01-13 NOTE — Telephone Encounter (Signed)
Left message on [name]'s vm

## 2017-01-13 NOTE — Telephone Encounter (Signed)
Fosamax was stopped per nephrology recommendations. This medicine may be hurting the kidney. I think the benefit of this medicine is not worth the risk.

## 2017-01-17 ENCOUNTER — Telehealth: Payer: Self-pay | Admitting: Family Medicine

## 2017-01-17 DIAGNOSIS — M2550 Pain in unspecified joint: Secondary | ICD-10-CM | POA: Diagnosis not present

## 2017-01-17 DIAGNOSIS — Z6829 Body mass index (BMI) 29.0-29.9, adult: Secondary | ICD-10-CM | POA: Diagnosis not present

## 2017-01-17 DIAGNOSIS — M0579 Rheumatoid arthritis with rheumatoid factor of multiple sites without organ or systems involvement: Secondary | ICD-10-CM | POA: Diagnosis not present

## 2017-01-17 DIAGNOSIS — Z79899 Other long term (current) drug therapy: Secondary | ICD-10-CM | POA: Diagnosis not present

## 2017-01-17 DIAGNOSIS — E663 Overweight: Secondary | ICD-10-CM | POA: Diagnosis not present

## 2017-01-17 DIAGNOSIS — M1009 Idiopathic gout, multiple sites: Secondary | ICD-10-CM | POA: Diagnosis not present

## 2017-01-17 NOTE — Telephone Encounter (Signed)
[name] called(pt's daughter).  Since Mom started Zoloft on Sunday she has developed an itchy rash, not sure if this from the Zoloft or the  Eloquis.  She has stopped mom from taking the Zoloft.  Daughter does not want an apppointment for  Mom she wants Dr Denyse Amass to tell her what she should do.  Thank you

## 2017-01-18 ENCOUNTER — Ambulatory Visit: Payer: Medicare PPO | Admitting: Family Medicine

## 2017-01-18 NOTE — Telephone Encounter (Signed)
Left detailed vm with information on daughters VM. Requested a call back with concerns.

## 2017-01-18 NOTE — Telephone Encounter (Signed)
STOP the Zoloft and we will recheck at the next scheduled appointment.

## 2017-01-19 ENCOUNTER — Ambulatory Visit: Payer: Medicare PPO | Admitting: Family Medicine

## 2017-01-23 ENCOUNTER — Ambulatory Visit: Payer: Medicare PPO | Admitting: Family

## 2017-01-23 ENCOUNTER — Telehealth: Payer: Self-pay | Admitting: *Deleted

## 2017-01-23 ENCOUNTER — Encounter (HOSPITAL_COMMUNITY): Payer: Medicare PPO

## 2017-01-23 DIAGNOSIS — I4891 Unspecified atrial fibrillation: Secondary | ICD-10-CM | POA: Diagnosis not present

## 2017-01-23 DIAGNOSIS — I509 Heart failure, unspecified: Secondary | ICD-10-CM | POA: Diagnosis not present

## 2017-01-23 DIAGNOSIS — I110 Hypertensive heart disease with heart failure: Secondary | ICD-10-CM | POA: Diagnosis not present

## 2017-01-23 DIAGNOSIS — I2510 Atherosclerotic heart disease of native coronary artery without angina pectoris: Secondary | ICD-10-CM | POA: Diagnosis not present

## 2017-01-23 DIAGNOSIS — N390 Urinary tract infection, site not specified: Secondary | ICD-10-CM | POA: Diagnosis not present

## 2017-01-23 DIAGNOSIS — E1151 Type 2 diabetes mellitus with diabetic peripheral angiopathy without gangrene: Secondary | ICD-10-CM | POA: Diagnosis not present

## 2017-01-23 DIAGNOSIS — D649 Anemia, unspecified: Secondary | ICD-10-CM | POA: Diagnosis not present

## 2017-01-23 DIAGNOSIS — M069 Rheumatoid arthritis, unspecified: Secondary | ICD-10-CM | POA: Diagnosis not present

## 2017-01-23 NOTE — Telephone Encounter (Signed)
Pt's daughter called stating that pt is still very anxious and depressed, especially in the evenings.  Daughter wanted to know if you could start her on something else sooner than her appointment on 11/7.  Also, daughter states that pt has been experiencing headaches, dizziness, & a cough.  Could this be from the Eliquis? Please advise.

## 2017-01-24 ENCOUNTER — Ambulatory Visit
Admission: RE | Admit: 2017-01-24 | Discharge: 2017-01-24 | Disposition: A | Discharge status: Home / Self Care | Payer: Medicare PPO | Source: Ambulatory Visit | Attending: Nephrology | Admitting: Nephrology

## 2017-01-24 DIAGNOSIS — N184 Chronic kidney disease, stage 4 (severe): Secondary | ICD-10-CM | POA: Diagnosis not present

## 2017-01-24 MED ORDER — MIRTAZAPINE 7.5 MG PO TABS: 7.5000 mg | ORAL_TABLET | Freq: Every day | ORAL | 0 refills | Status: DC

## 2017-01-24 NOTE — Telephone Encounter (Signed)
Pt's daughter notified of new rx.

## 2017-01-24 NOTE — Telephone Encounter (Signed)
Will try Mirtazapine at bedtime.  [facility] send [facility].

## 2017-01-25 ENCOUNTER — Other Ambulatory Visit: Payer: Self-pay

## 2017-01-25 ENCOUNTER — Telehealth: Payer: Self-pay | Admitting: Family Medicine

## 2017-01-25 DIAGNOSIS — E1151 Type 2 diabetes mellitus with diabetic peripheral angiopathy without gangrene: Secondary | ICD-10-CM | POA: Diagnosis not present

## 2017-01-25 DIAGNOSIS — I2510 Atherosclerotic heart disease of native coronary artery without angina pectoris: Secondary | ICD-10-CM | POA: Diagnosis not present

## 2017-01-25 DIAGNOSIS — N390 Urinary tract infection, site not specified: Secondary | ICD-10-CM | POA: Diagnosis not present

## 2017-01-25 DIAGNOSIS — M069 Rheumatoid arthritis, unspecified: Secondary | ICD-10-CM | POA: Diagnosis not present

## 2017-01-25 DIAGNOSIS — I4891 Unspecified atrial fibrillation: Secondary | ICD-10-CM | POA: Diagnosis not present

## 2017-01-25 DIAGNOSIS — I110 Hypertensive heart disease with heart failure: Secondary | ICD-10-CM | POA: Diagnosis not present

## 2017-01-25 DIAGNOSIS — I509 Heart failure, unspecified: Secondary | ICD-10-CM | POA: Diagnosis not present

## 2017-01-25 DIAGNOSIS — D649 Anemia, unspecified: Secondary | ICD-10-CM | POA: Diagnosis not present

## 2017-01-25 MED ORDER — APIXABAN 2.5 MG PO TABS: 2.5000 mg | ORAL_TABLET | Freq: Two times a day (BID) | ORAL | 1 refills | Status: DC

## 2017-01-25 MED ORDER — APIXABAN 2.5 MG PO TABS: 2.5000 mg | ORAL_TABLET | Freq: Two times a day (BID) | ORAL | 6 refills | Status: DC

## 2017-01-25 MED ORDER — TORSEMIDE 100 MG PO TABS: 50.0000 mg | ORAL_TABLET | Freq: Two times a day (BID) | ORAL | 6 refills | Status: DC

## 2017-01-25 MED ORDER — TORSEMIDE 100 MG PO TABS: 50.0000 mg | ORAL_TABLET | Freq: Two times a day (BID) | ORAL | 1 refills | Status: DC

## 2017-01-25 NOTE — Telephone Encounter (Signed)
Left VM for Pt's daughter advising of refill.

## 2017-01-25 NOTE — Telephone Encounter (Signed)
Daughter advised.

## 2017-01-25 NOTE — Telephone Encounter (Signed)
Eliquis sent to [facility].   Torsemide 100mg  (50mg  po bid) sent to [facility].   Warfarin formally discontinued.

## 2017-01-25 NOTE — Progress Notes (Signed)
Rx's changed to mail order per Pt's daughter request.

## 2017-01-25 NOTE — Telephone Encounter (Signed)
Pt's daughter called to get Rx for eliquis 2.5mg  and furosemide 100mg  (Pt takes 50mg  BID) send to [facility] pharmacy. Do not see furosemide on active Rx list. Do see Rx for both eliquis and warfarin on active list. Will route.

## 2017-01-30 ENCOUNTER — Ambulatory Visit (INDEPENDENT_AMBULATORY_CARE_PROVIDER_SITE_OTHER): Payer: Medicare PPO

## 2017-01-30 ENCOUNTER — Encounter: Payer: Self-pay | Admitting: Family Medicine

## 2017-01-30 ENCOUNTER — Ambulatory Visit (INDEPENDENT_AMBULATORY_CARE_PROVIDER_SITE_OTHER): Payer: Medicare PPO | Admitting: Family Medicine

## 2017-01-30 VITALS — BP 127/84 | HR 82 | Temp 98.7°F | Wt 161.0 lb

## 2017-01-30 DIAGNOSIS — I700 Atherosclerosis of aorta: Secondary | ICD-10-CM | POA: Diagnosis not present

## 2017-01-30 DIAGNOSIS — M4850XA Collapsed vertebra, not elsewhere classified, site unspecified, initial encounter for fracture: Secondary | ICD-10-CM

## 2017-01-30 DIAGNOSIS — R05 Cough: Secondary | ICD-10-CM | POA: Diagnosis not present

## 2017-01-30 DIAGNOSIS — R059 Cough, unspecified: Secondary | ICD-10-CM

## 2017-01-30 IMAGING — DX DG CHEST 2V
2 series · 2 of 2 positions shown · non-contrast
Comparison: Radiographs [DATE].

CLINICAL DATA: Cough.

EXAM:
CHEST  2 VIEW

[chest pa]
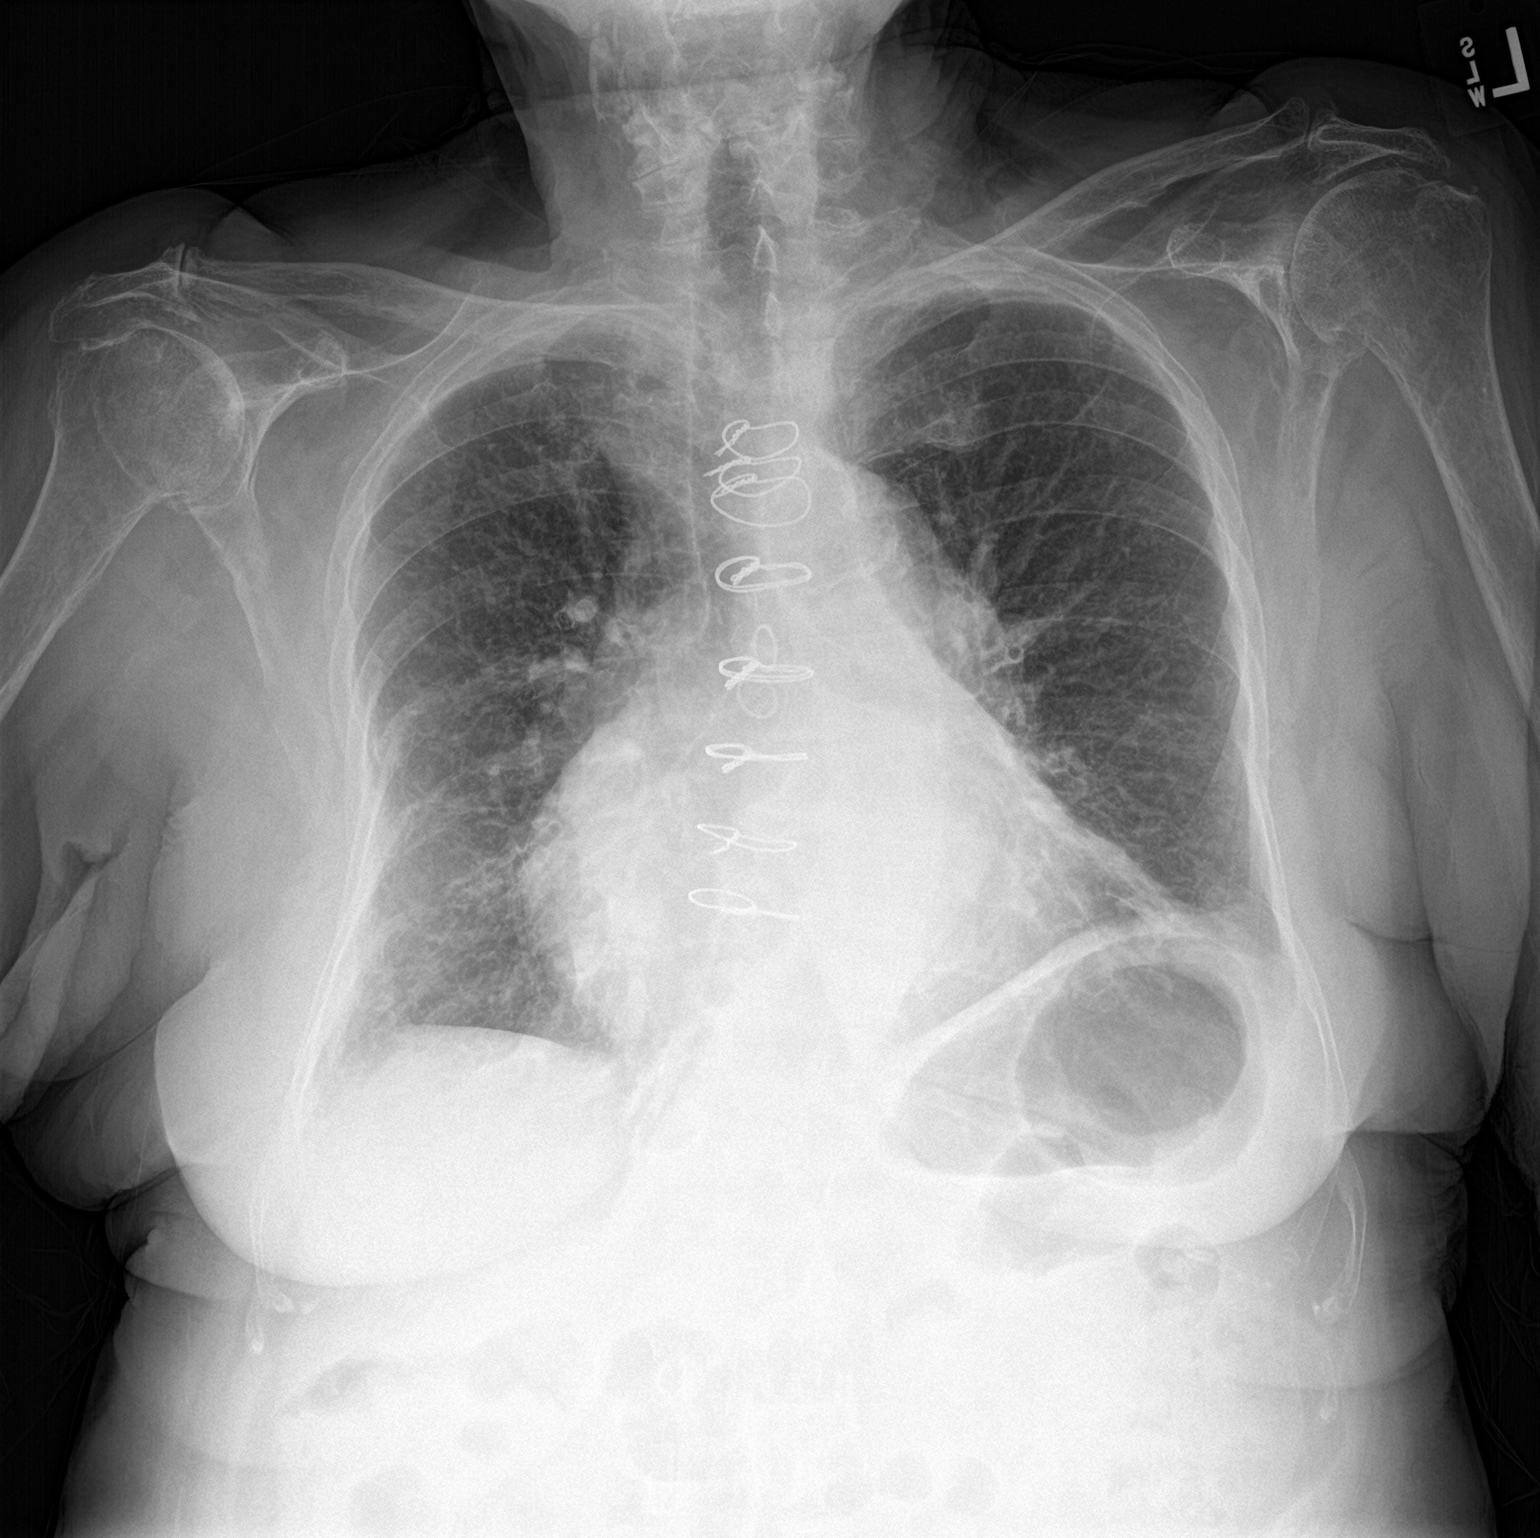

[chest lat]
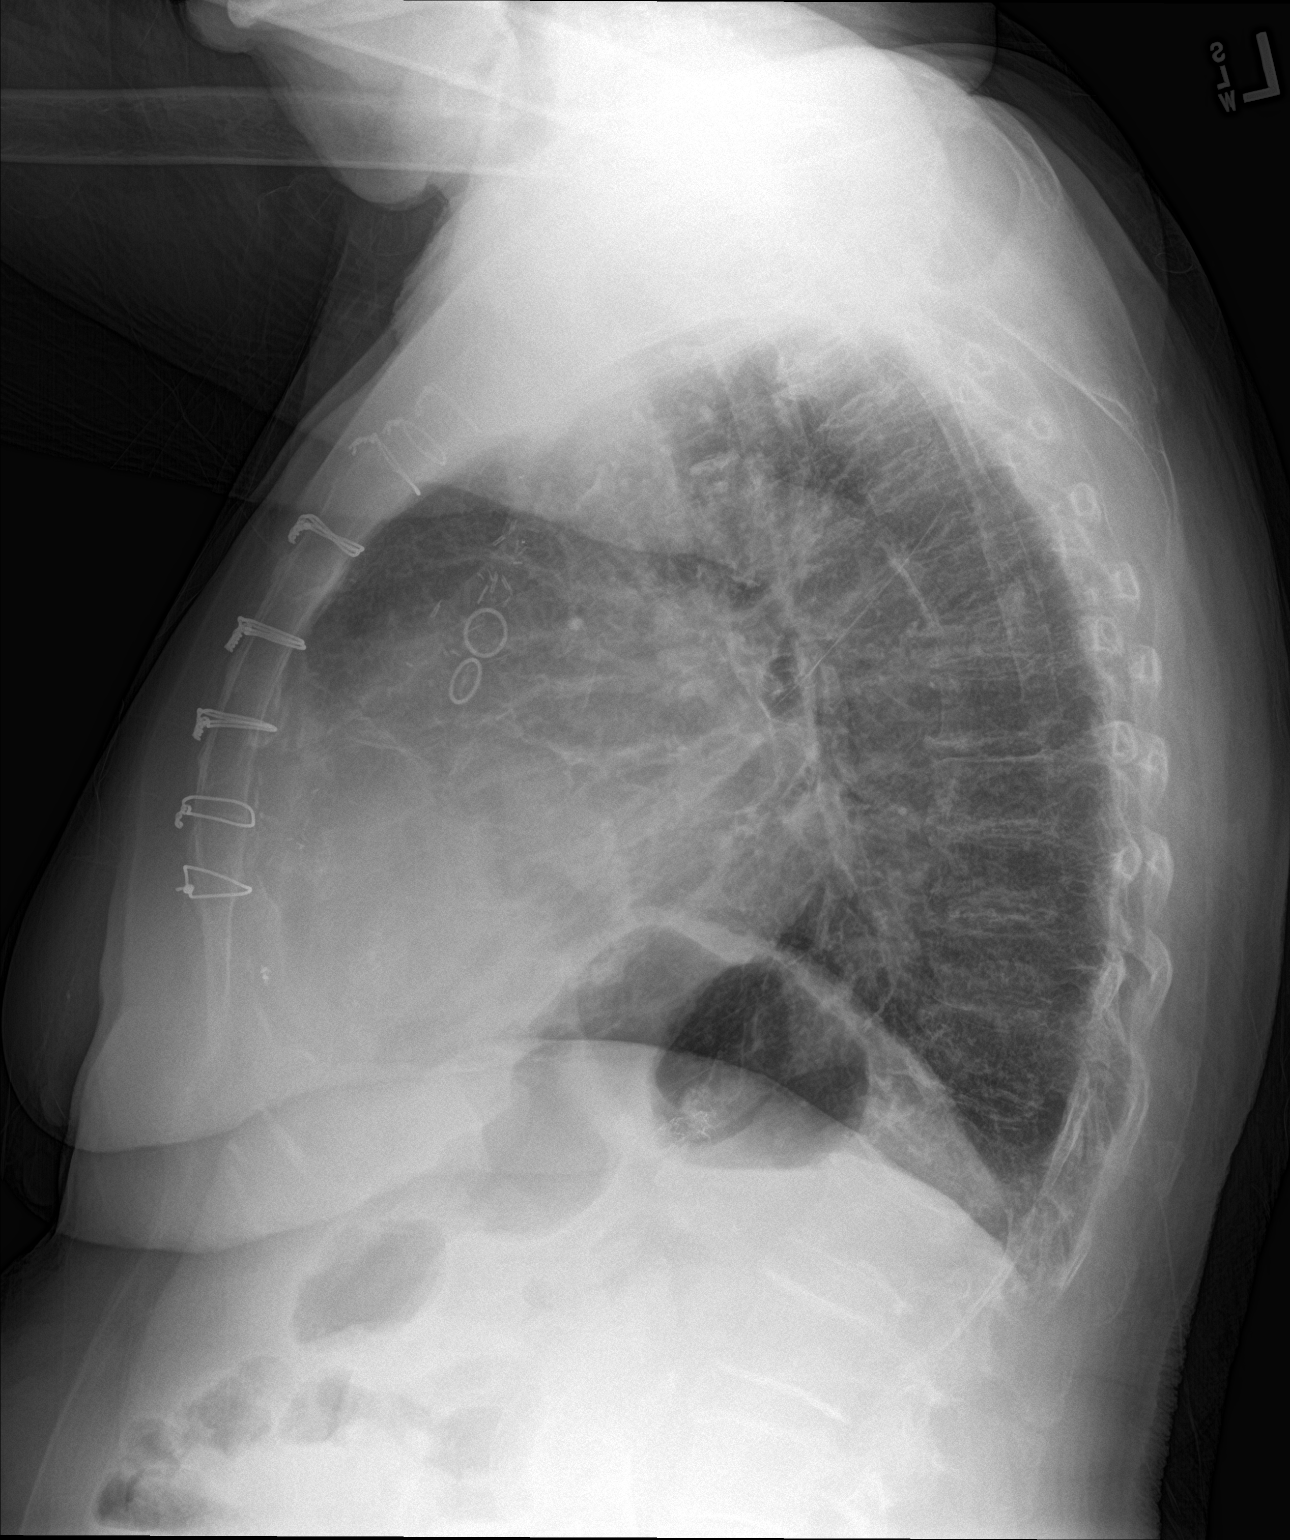

[2 of 2 positions shown; findings below may reference images not displayed]

FINDINGS: Stable cardiomegaly. Status post coronary artery bypass graft.
Atherosclerosis of thoracic aorta is noted. No pneumothorax or
pleural effusion is noted. Mild interstitial densities are noted
throughout both lungs which may represent scarring, but superimposed
acute edema or inflammation cannot be excluded. Severe wedge
compression deformity of midthoracic vertebral body is noted most
consistent with subacute fracture as it has progressed significantly
since prior exam.
IMPRESSION: Aortic atherosclerosis. Mild interstitial densities are noted
throughout both lungs which may represent scarring, but acute
superimposed edema or inflammation cannot be excluded.

Severe wedge compression deformity of midthoracic vertebral body is
noted consistent with subacute fracture as it has progressed
significantly since prior exam.

## 2017-01-30 MED ORDER — PREDNISONE 10 MG PO TABS: 30.0000 mg | ORAL_TABLET | Freq: Every day | ORAL | 0 refills | Status: DC

## 2017-01-30 NOTE — Progress Notes (Signed)
WHISPER KURKA is a 83 y.o. female who presents to [name]: Primary Care Sports Medicine today for cough congestion.  Symptoms have been present for a few weeks now.  Patient denies any fevers or chills.  She notes the cough is nonproductive and somewhat obnoxious.  She denies any vomiting or diarrhea chest pain or palpitations.  She feels well otherwise.  Additionally patient was treated previously for a rash thought to be due to Drug reaction.  She notes that since the drug was stopped she no longer has the rash.  Additionally she was recently started on Eliquis.  She is tolerating this medication nicely.     Past Medical History:  Diagnosis Date  . Acute pancreatitis   . Anemia   . Arthritis   . Atrial fibrillation (HCC)   . CAD (coronary artery disease)   . Carotid artery occlusion   . CHF (congestive heart failure) (HCC)   . Diabetes mellitus age 49  . DJD (degenerative joint disease)   . DVT (deep venous thrombosis) (HCC)   . GERD (gastroesophageal reflux disease)   . GI bleed   . Hyperlipidemia   . Hypertension   . Joint pain   . Mesenteric ischemia   . Peripheral vascular disease (HCC)   . Thyroid disease    Past Surgical History:  Procedure Laterality Date  . APPENDECTOMY    . CELIAC ARTERY STENT    . CORONARY ARTERY BYPASS GRAFT    . EMBOLECTOMY  2009   right leg  . FASCIOTOMY     right leg  . HIP FRACTURE SURGERY Left   . JOINT REPLACEMENT    . TOTAL KNEE ARTHROPLASTY     bilateral  . TUBAL LIGATION    . VISCERAL ANGIOGRAM N/A 05/31/2013   Procedure: MESSENTERIC [name];  Surgeon: [name], MD;  Location: [facility] CATH LAB;  Service: Cardiovascular;  Laterality: N/A;   Social History  Substance Use Topics  . Smoking status: Never Smoker  . Smokeless tobacco: Never Used  . Alcohol use No   family history includes Cancer in her daughter and mother; Coronary  artery disease in her sister; Diabetes in her daughter, sister, and son; Heart disease in her sister; Hyperlipidemia in her son; Hypertension in her daughter and son; Other in her father and sister.  ROS as above:  Medications: Current Outpatient Prescriptions  Medication Sig Dispense Refill  . AMBULATORY NON FORMULARY MEDICATION Please check INR weekly for 2 months. Send reports to Dr Denyse Amass fax [phone] 1 each 0  . apixaban (ELIQUIS) 2.5 MG TABS tablet Take 1 tablet (2.5 mg total) by mouth 2 (two) times daily. Take in substitution of warfarin if affordable 180 tablet 1  . Calcium Carb-Cholecalciferol 600-800 MG-UNIT TABS TAKE ONE TABLET BY MOUTH 2 TIMES A DAY 60 tablet 6  . clonazePAM (KLONOPIN) 0.5 MG tablet Take 1 tablet (0.5 mg total) by mouth at bedtime. 90 tablet 1  . conjugated estrogens (PREMARIN) vaginal cream Place 1 Applicatorful vaginally daily. 42.5 g 12  . diltiazem (CARDIZEM CD) 180 MG 24 hr capsule Take 1 capsule (180 mg total) by mouth at bedtime. 90 capsule 3  . Ergocalciferol (VITAMIN D2) 2000 units TABS Take 2,000 Units by mouth daily.     . folic acid (FOLVITE) 1 MG tablet Take 1 tablet (1 mg total) by mouth daily. 90 tablet 3  . glucose blood test strip Once daily E11.29 100 each 12  .  iron polysaccharides (FERREX 150) 150 MG capsule Take 1 capsule (150 mg total) by mouth daily. 90 capsule 3  . leflunomide (ARAVA) 10 MG tablet Take 1 tablet (10 mg total) by mouth at bedtime. 90 tablet 1  . levothyroxine (SYNTHROID, LEVOTHROID) 50 MCG tablet Take 1 tablet (50 mcg total) by mouth daily. 90 tablet 1  . metoprolol succinate (TOPROL-XL) 50 MG 24 hr tablet Take 1 tablet (50 mg total) by mouth daily. 90 tablet 3  . mirtazapine (REMERON) 7.5 MG tablet Take 1 tablet (7.5 mg total) by mouth at bedtime. 30 tablet 0  . pravastatin (PRAVACHOL) 40 MG tablet Take 1 tablet (40 mg total) by mouth daily. 90 tablet 1  . predniSONE (DELTASONE) 5 MG tablet Take 1 or 2 tablets by mouth two  times a day as needed for rheumatoid flair. 180 tablet 0  . STOOL SOFTENER 100 MG capsule TAKE 1 CAPSULE BY MOUTH 2 TIMES A DAY (AM & PM) (Patient taking differently: TAKE 100 MG BY MOUTH 2 TIMES A DAY (AM & PM)) 56 capsule 0  . torsemide (DEMADEX) 100 MG tablet Take 0.5 tablets (50 mg total) by mouth 2 (two) times daily. 90 tablet 1  . traMADol (ULTRAM) 50 MG tablet Take 1 tablet (50 mg total) by mouth 2 (two) times daily as needed. Please fax to [facility]. 180 tablet 1  . traZODone (DESYREL) 50 MG tablet Take 0.5-1 tablets (25-50 mg total) by mouth at bedtime as needed for sleep. 90 tablet 0  . vitamin B-12 (CYANOCOBALAMIN) 1000 MCG tablet Take 1,000 mcg by mouth daily.    . predniSONE (DELTASONE) 10 MG tablet Take 3 tablets (30 mg total) by mouth daily with breakfast. 15 tablet 0   No current facility-administered medications for this visit.    Allergies  Allergen Reactions  . Cefuroxime Anaphylaxis and Other (See Comments)    Throat swelling  . Fish Allergy Shortness Of Breath    Pt said she has throat swelling   . Ciprofloxacin Rash  . Cephalexin Rash  . Codeine Other (See Comments)    Jittery  . Zoloft [Sertraline Hcl] Rash    Health Maintenance Health Maintenance  Topic Date Due  . URINE MICROALBUMIN  01/22/1940  . OPHTHALMOLOGY EXAM  07/13/2016  . HEMOGLOBIN A1C  05/22/2017  . FOOT EXAM  07/26/2017  . TETANUS/TDAP  12/28/2025  . INFLUENZA VACCINE  Completed  . PNA vac Low Risk Adult  Completed     Exam:  BP 127/84   Pulse 82   Temp 98.7 F (37.1 C) (Oral)   Wt 161 lb (73 kg)   SpO2 95%   BMI 29.45 kg/m  Gen: Well NAD HEENT: EOMI,  MMM Lungs: Normal work of breathing.  coarse breath sounds throughout. Heart: RRR no MRG Abd: NABS, Soft. Nondistended, Nontender Exts: Brisk capillary refill, warm and well perfused.  .   No results found for this or any previous visit (from the past 72 hour(s)). Dg Chest 2 View  Result Date: 01/30/2017 CLINICAL DATA:  Cough.  EXAM: CHEST  2 VIEW COMPARISON:  Radiographs of November 17, 2016. FINDINGS: Stable cardiomegaly. Status post coronary artery bypass graft. Atherosclerosis of thoracic aorta is noted. No pneumothorax or pleural effusion is noted. Mild interstitial densities are noted throughout both lungs which may represent scarring, but superimposed acute edema or inflammation cannot be excluded. Severe wedge compression deformity of midthoracic vertebral body is noted most consistent with subacute fracture as it has progressed significantly since prior exam.  IMPRESSION: Aortic atherosclerosis. Mild interstitial densities are noted throughout both lungs which may represent scarring, but acute superimposed edema or inflammation cannot be excluded. Severe wedge compression deformity of midthoracic vertebral body is noted consistent with subacute fracture as it has progressed significantly since prior exam. Electronically Signed   By: Lupita Raider, M.D.   On: 01/30/2017 16:57      Assessment and Plan: 83 y.o. female with cough.  Based on exam and chest x-ray pneumonia is not likely.  Plan to treat as though bronchitis with a short course of prednisone which has done well in the past as well as over-the-counter medications for cough suppression.  Patient has what appears to be a subacute vertebral compression fracture.  She did not complain of back pain on today's exam.  I will discuss this with the patient over the phone in the interim.  If back pain has been worsening I think is reasonable to treat for compression fracture.  We may consider NSAIDs versus calcitonin versus vertebroplasty.    Orders Placed This Encounter  Procedures  . DG Chest 2 View    Order Specific Question:   Reason for exam:    Answer:   Cough, assess intra-thoracic pathology    Order Specific Question:   Preferred imaging location?    Answer:   [facility]   Meds ordered this encounter  Medications  . predniSONE (DELTASONE) 10  MG tablet    Sig: Take 3 tablets (30 mg total) by mouth daily with breakfast.    Dispense:  15 tablet    Refill:  0     Discussed warning signs or symptoms. Please see discharge instructions. Patient expresses understanding.

## 2017-01-30 NOTE — Patient Instructions (Signed)
Thank you for coming in today. Get xray today.  Start prednisone.  Keep appointment for Nov 7th.  Recheck sooner if needed.    Cough, Adult Coughing is a reflex that clears your throat and your airways. Coughing helps to heal and protect your lungs. It is normal to cough occasionally, but a cough that happens with other symptoms or lasts a long time may be a sign of a condition that needs treatment. A cough may last only 2-3 weeks (acute), or it may last longer than 8 weeks (chronic). What are the causes? Coughing is commonly caused by:  Breathing in substances that irritate your lungs.  A viral or bacterial respiratory infection.  Allergies.  Asthma.  Postnasal drip.  Smoking.  Acid backing up from the stomach into the esophagus (gastroesophageal reflux).  Certain medicines.  Chronic lung problems, including COPD (or rarely, lung cancer).  Other medical conditions such as heart failure.  Follow these instructions at home: Pay attention to any changes in your symptoms. Take these actions to help with your discomfort:  Take medicines only as told by your health care provider. ? If you were prescribed an antibiotic medicine, take it as told by your health care provider. Do not stop taking the antibiotic even if you start to feel better. ? Talk with your health care provider before you take a cough suppressant medicine.  Drink enough fluid to keep your urine clear or pale yellow.  If the air is dry, use a cold steam vaporizer or humidifier in your bedroom or your home to help loosen secretions.  Avoid anything that causes you to cough at work or at home.  If your cough is worse at night, try sleeping in a semi-upright position.  Avoid cigarette smoke. If you smoke, quit smoking. If you need help quitting, ask your health care provider.  Avoid caffeine.  Avoid alcohol.  Rest as needed.  Contact a health care provider if:  You have new symptoms.  You cough up  pus.  Your cough does not get better after 2-3 weeks, or your cough gets worse.  You cannot control your cough with suppressant medicines and you are losing sleep.  You develop pain that is getting worse or pain that is not controlled with pain medicines.  You have a fever.  You have unexplained weight loss.  You have night sweats. Get help right away if:  You cough up blood.  You have difficulty breathing.  Your heartbeat is very fast. This information is not intended to replace advice given to you by your health care provider. Make sure you discuss any questions you have with your health care provider. Document Released: 09/24/2010 Document Revised: 09/03/2015 Document Reviewed: 06/04/2014 Elsevier Interactive Patient Education  2017 [employer].

## 2017-02-01 ENCOUNTER — Telehealth: Payer: Self-pay | Admitting: *Deleted

## 2017-02-01 DIAGNOSIS — I509 Heart failure, unspecified: Secondary | ICD-10-CM | POA: Diagnosis not present

## 2017-02-01 DIAGNOSIS — N390 Urinary tract infection, site not specified: Secondary | ICD-10-CM | POA: Diagnosis not present

## 2017-02-01 DIAGNOSIS — I2510 Atherosclerotic heart disease of native coronary artery without angina pectoris: Secondary | ICD-10-CM | POA: Diagnosis not present

## 2017-02-01 DIAGNOSIS — D649 Anemia, unspecified: Secondary | ICD-10-CM | POA: Diagnosis not present

## 2017-02-01 DIAGNOSIS — I110 Hypertensive heart disease with heart failure: Secondary | ICD-10-CM | POA: Diagnosis not present

## 2017-02-01 DIAGNOSIS — E1151 Type 2 diabetes mellitus with diabetic peripheral angiopathy without gangrene: Secondary | ICD-10-CM | POA: Diagnosis not present

## 2017-02-01 DIAGNOSIS — M069 Rheumatoid arthritis, unspecified: Secondary | ICD-10-CM | POA: Diagnosis not present

## 2017-02-01 DIAGNOSIS — I4891 Unspecified atrial fibrillation: Secondary | ICD-10-CM | POA: Diagnosis not present

## 2017-02-01 NOTE — Telephone Encounter (Signed)
[name] with [facility] [phone] care did a visit with patient today. She wanted to let you know her BP was 160-90 this morning and 164/100 at the time of her call earlier this afternoon. Patient weighed 154lb on Tuesday and 157lbs today. There is no visible swelling and patient is not having trouble breathing. [name] would like a call back with any recommendations or advise.

## 2017-02-02 DIAGNOSIS — E11621 Type 2 diabetes mellitus with foot ulcer: Secondary | ICD-10-CM | POA: Diagnosis not present

## 2017-02-02 DIAGNOSIS — L97522 Non-pressure chronic ulcer of other part of left foot with fat layer exposed: Secondary | ICD-10-CM | POA: Diagnosis not present

## 2017-02-02 NOTE — Telephone Encounter (Signed)
Left a message on Ann's vm with Dr. Zollie Pee advise. (correction to Ann's phone # [phone])

## 2017-02-02 NOTE — Telephone Encounter (Signed)
I think we can do a bit of watchful waiting on that blood pressure in [name] who is asymptomatic.  The elevated blood pressure is likely due to recent oral prednisone course and will likely resolve itself.   I think we are more likely to hurt her with aggressive blood pressure control than to help her.   We will recheck at the next visit as long as she is doing well.

## 2017-02-03 ENCOUNTER — Encounter (HOSPITAL_COMMUNITY): Payer: Medicare PPO

## 2017-02-03 ENCOUNTER — Ambulatory Visit: Payer: Medicare PPO | Admitting: Family

## 2017-02-06 ENCOUNTER — Telehealth: Payer: Self-pay | Admitting: Family Medicine

## 2017-02-06 DIAGNOSIS — D631 Anemia in chronic kidney disease: Secondary | ICD-10-CM | POA: Diagnosis not present

## 2017-02-06 DIAGNOSIS — N184 Chronic kidney disease, stage 4 (severe): Secondary | ICD-10-CM | POA: Diagnosis not present

## 2017-02-06 DIAGNOSIS — I739 Peripheral vascular disease, unspecified: Secondary | ICD-10-CM | POA: Diagnosis not present

## 2017-02-06 DIAGNOSIS — I2510 Atherosclerotic heart disease of native coronary artery without angina pectoris: Secondary | ICD-10-CM | POA: Diagnosis not present

## 2017-02-06 DIAGNOSIS — I5032 Chronic diastolic (congestive) heart failure: Secondary | ICD-10-CM | POA: Diagnosis not present

## 2017-02-06 DIAGNOSIS — E1129 Type 2 diabetes mellitus with other diabetic kidney complication: Secondary | ICD-10-CM | POA: Diagnosis not present

## 2017-02-06 DIAGNOSIS — N2581 Secondary hyperparathyroidism of renal origin: Secondary | ICD-10-CM | POA: Diagnosis not present

## 2017-02-06 DIAGNOSIS — I129 Hypertensive chronic kidney disease with stage 1 through stage 4 chronic kidney disease, or unspecified chronic kidney disease: Secondary | ICD-10-CM | POA: Diagnosis not present

## 2017-02-06 DIAGNOSIS — I4891 Unspecified atrial fibrillation: Secondary | ICD-10-CM | POA: Diagnosis not present

## 2017-02-06 MED ORDER — PREDNISONE 10 MG PO TABS: 30.0000 mg | ORAL_TABLET | Freq: Every day | ORAL | 0 refills | Status: DC

## 2017-02-06 NOTE — Telephone Encounter (Signed)
Another short course of prednisone sent in. If not better follow up in clinic

## 2017-02-06 NOTE — Telephone Encounter (Signed)
Daughter advised.

## 2017-02-06 NOTE — Telephone Encounter (Signed)
Received call from Pt's daughter requesting an Rx for prednisone. States Pt saw her nephrologist this am (Dr. Sonia Side) and he advised them to contact PCP for the Rx. States it will help with the cough/congestion Pt is still having. Will route.

## 2017-02-07 DIAGNOSIS — I509 Heart failure, unspecified: Secondary | ICD-10-CM | POA: Diagnosis not present

## 2017-02-07 DIAGNOSIS — N390 Urinary tract infection, site not specified: Secondary | ICD-10-CM | POA: Diagnosis not present

## 2017-02-07 DIAGNOSIS — D649 Anemia, unspecified: Secondary | ICD-10-CM | POA: Diagnosis not present

## 2017-02-07 DIAGNOSIS — I110 Hypertensive heart disease with heart failure: Secondary | ICD-10-CM | POA: Diagnosis not present

## 2017-02-07 DIAGNOSIS — E1151 Type 2 diabetes mellitus with diabetic peripheral angiopathy without gangrene: Secondary | ICD-10-CM | POA: Diagnosis not present

## 2017-02-07 DIAGNOSIS — I4891 Unspecified atrial fibrillation: Secondary | ICD-10-CM | POA: Diagnosis not present

## 2017-02-07 DIAGNOSIS — M069 Rheumatoid arthritis, unspecified: Secondary | ICD-10-CM | POA: Diagnosis not present

## 2017-02-07 DIAGNOSIS — I2510 Atherosclerotic heart disease of native coronary artery without angina pectoris: Secondary | ICD-10-CM | POA: Diagnosis not present

## 2017-02-08 ENCOUNTER — Encounter (HOSPITAL_COMMUNITY): Payer: Medicare PPO

## 2017-02-08 ENCOUNTER — Ambulatory Visit: Payer: Medicare PPO | Admitting: Family

## 2017-02-09 ENCOUNTER — Encounter (HOSPITAL_COMMUNITY): Payer: Medicare PPO

## 2017-02-09 ENCOUNTER — Ambulatory Visit: Payer: Medicare PPO | Admitting: Family

## 2017-02-13 ENCOUNTER — Ambulatory Visit: Payer: Medicare PPO | Admitting: Family Medicine

## 2017-02-14 ENCOUNTER — Ambulatory Visit (INDEPENDENT_AMBULATORY_CARE_PROVIDER_SITE_OTHER): Payer: Medicare PPO

## 2017-02-14 ENCOUNTER — Encounter: Payer: Self-pay | Admitting: Family Medicine

## 2017-02-14 ENCOUNTER — Ambulatory Visit (INDEPENDENT_AMBULATORY_CARE_PROVIDER_SITE_OTHER): Payer: Medicare PPO | Admitting: Family Medicine

## 2017-02-14 VITALS — BP 192/99 | HR 130 | Temp 98.7°F | Resp 20 | Wt 153.0 lb

## 2017-02-14 DIAGNOSIS — R05 Cough: Secondary | ICD-10-CM

## 2017-02-14 DIAGNOSIS — R009 Unspecified abnormalities of heart beat: Secondary | ICD-10-CM

## 2017-02-14 DIAGNOSIS — R059 Cough, unspecified: Secondary | ICD-10-CM

## 2017-02-14 DIAGNOSIS — I10 Essential (primary) hypertension: Secondary | ICD-10-CM

## 2017-02-14 DIAGNOSIS — I482 Chronic atrial fibrillation, unspecified: Secondary | ICD-10-CM

## 2017-02-14 DIAGNOSIS — I517 Cardiomegaly: Secondary | ICD-10-CM

## 2017-02-14 IMAGING — DX DG CHEST 2V
2 series · 2 of 2 positions shown · non-contrast
Comparison: [DATE], [DATE]

CLINICAL DATA: Cough

EXAM:
CHEST  2 VIEW

[chest pa]
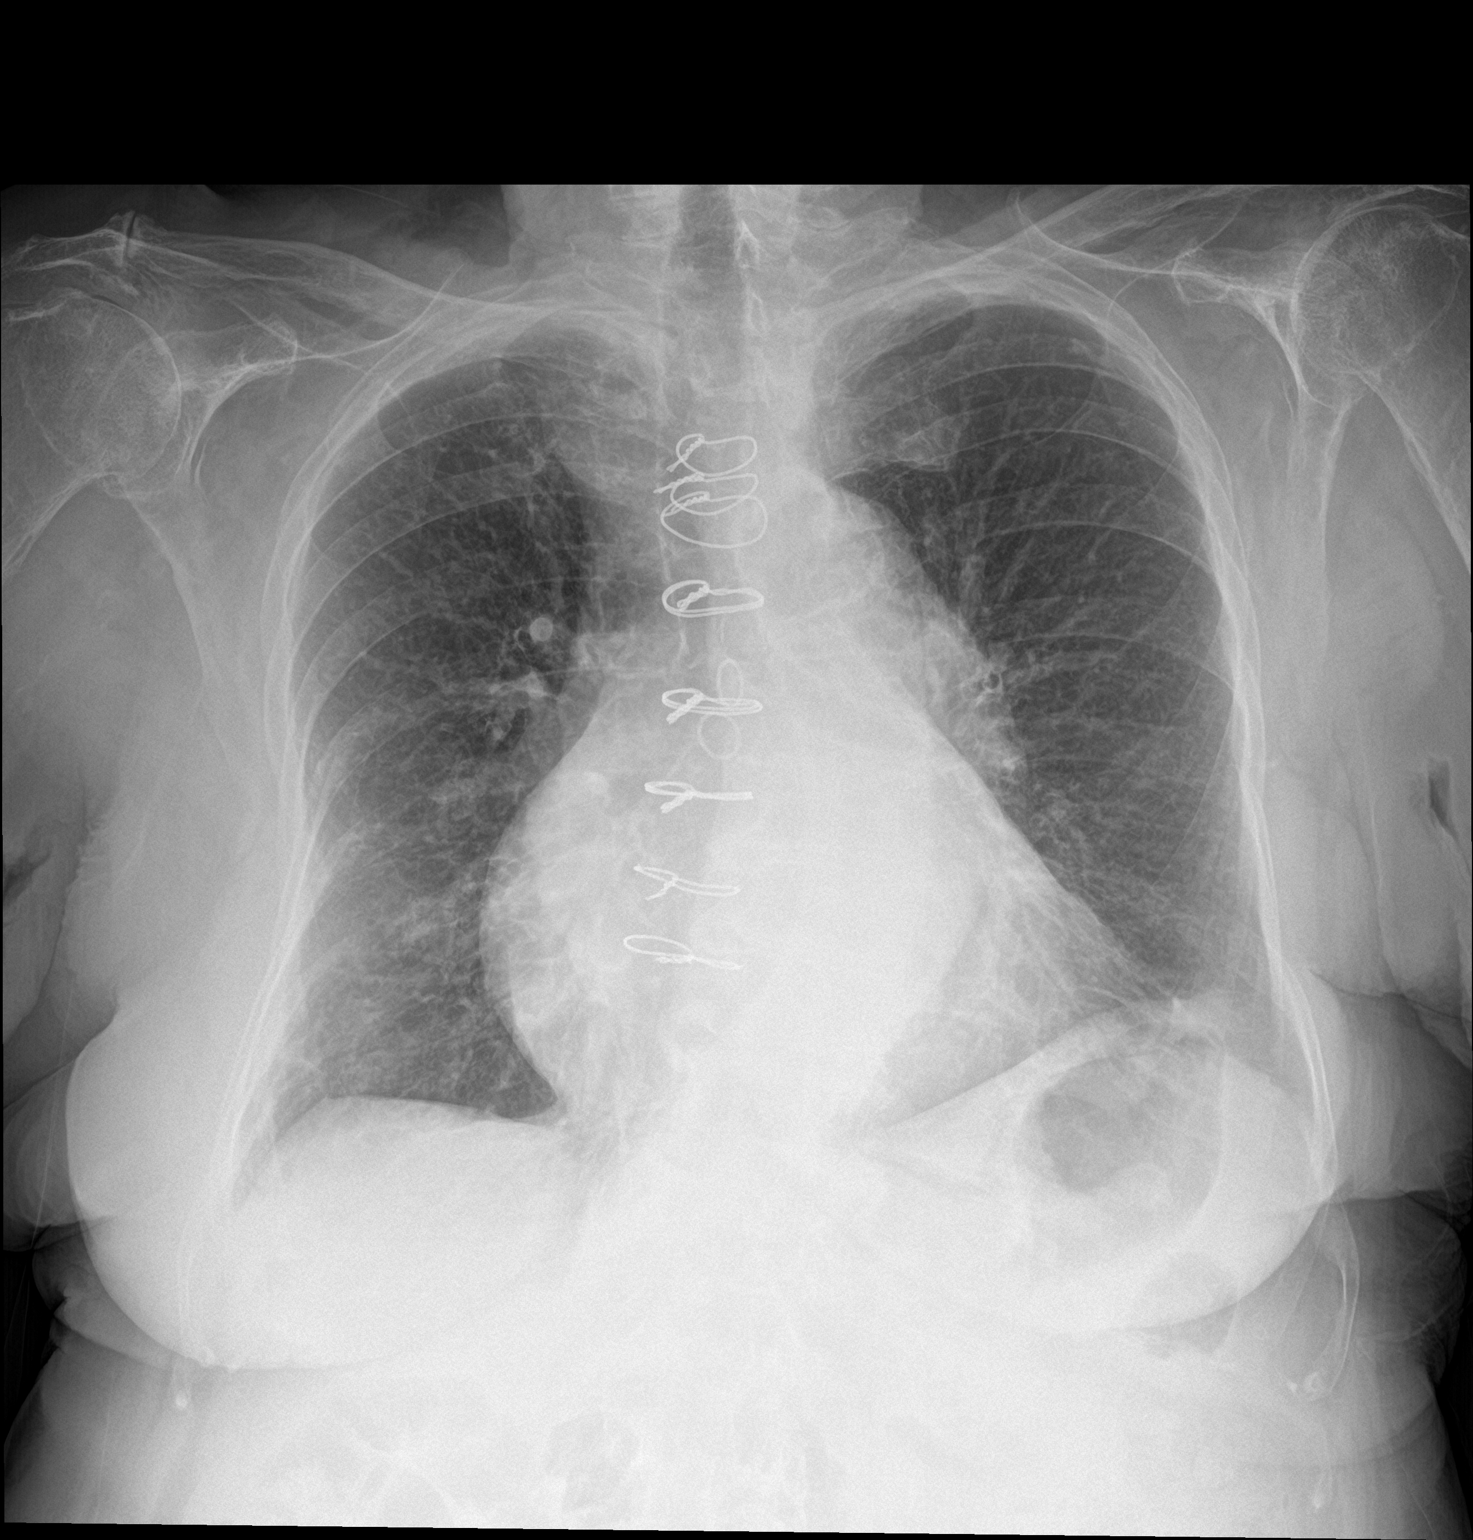

[chest lat]
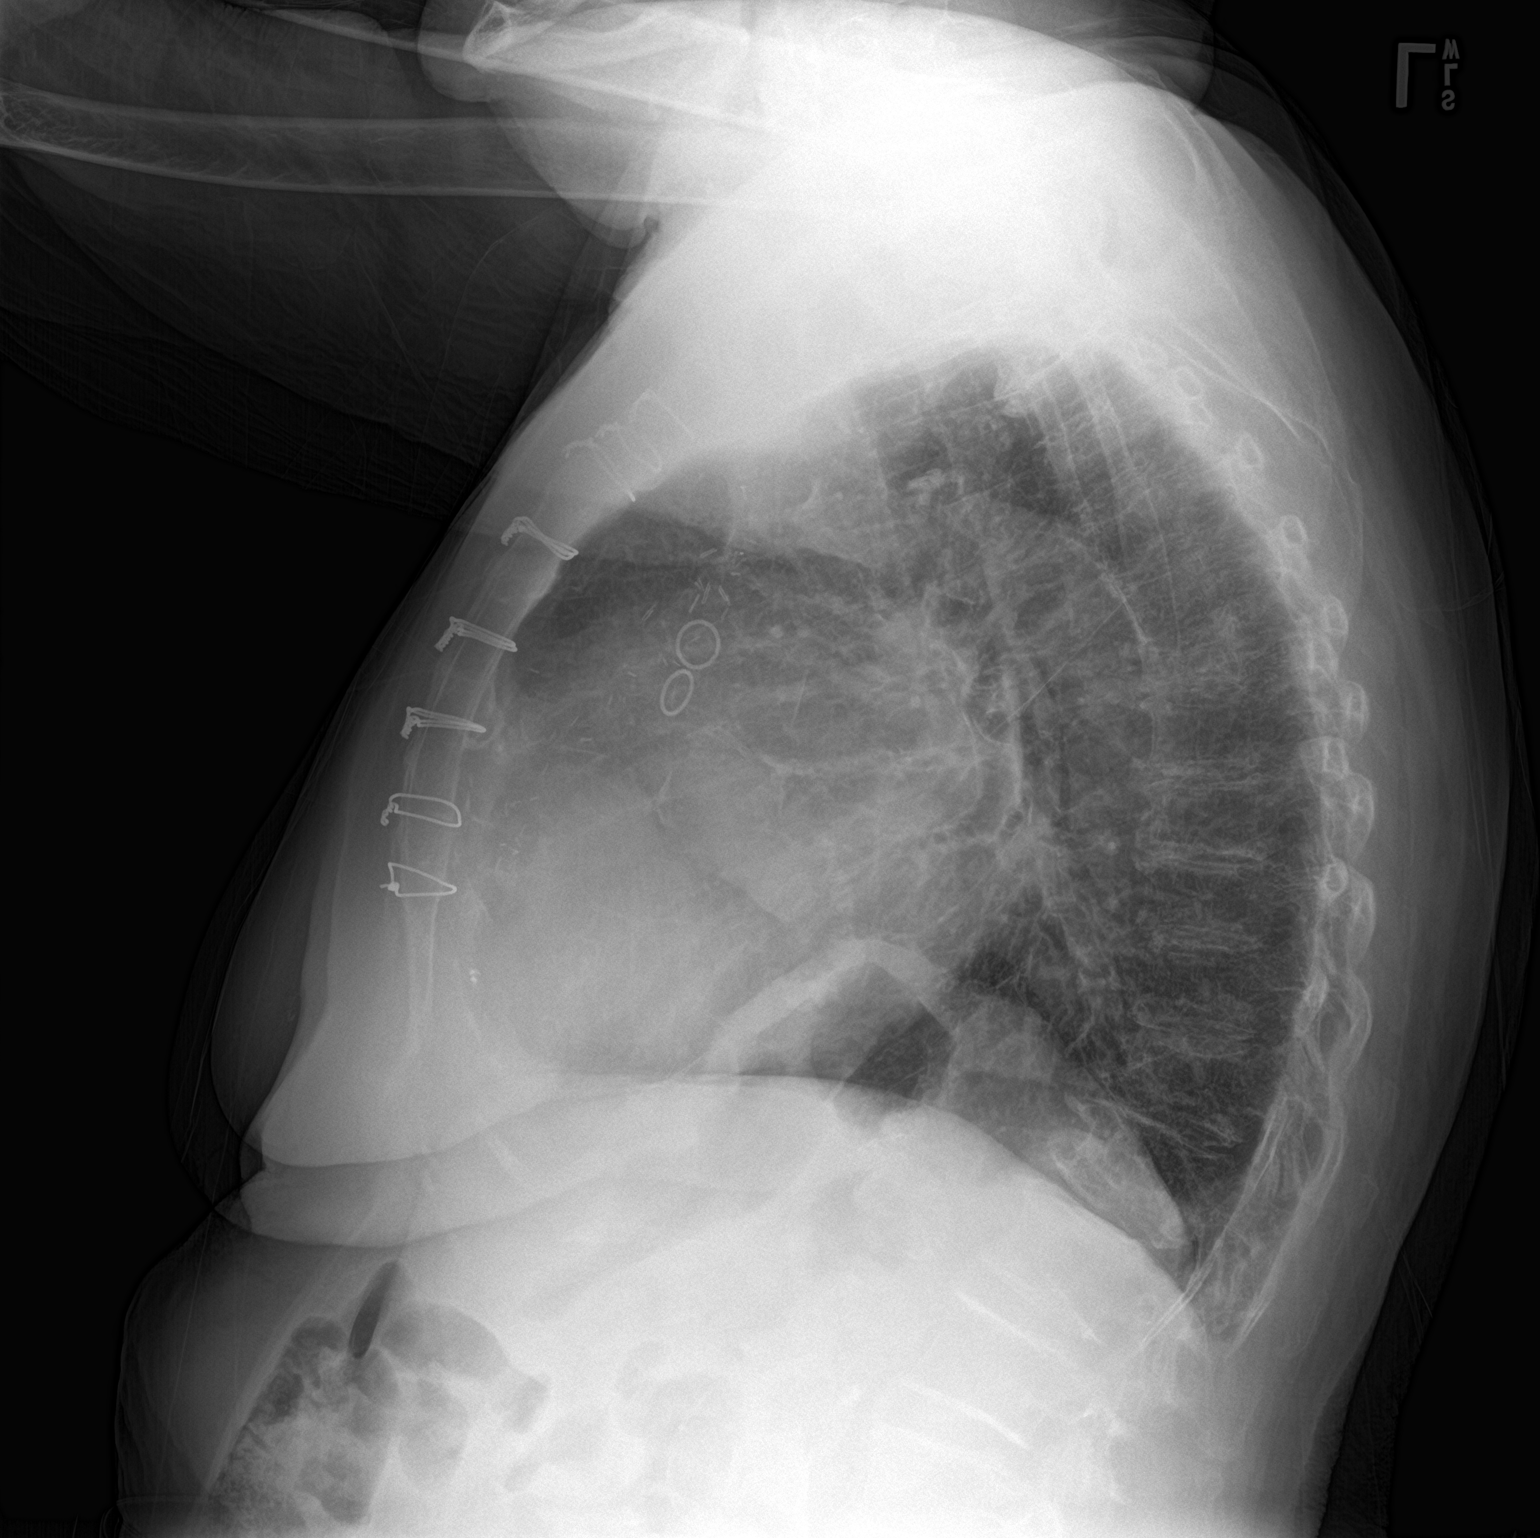

[2 of 2 positions shown; findings below may reference images not displayed]

FINDINGS: Post sternotomy changes. Coarse chronic interstitial opacity. No
focal consolidation or effusion. Stable enlarged cardiomediastinal
silhouette with tortuous calcified aorta. No pneumothorax. Elevation
of the left diaphragm, chronic. Re- demonstrated thoracic
compression deformity.
IMPRESSION: 1. No acute infiltrate or edema
2. Cardiomegaly
3. Coarse chronic interstitial opacity

## 2017-02-14 MED ORDER — HYDROCODONE-HOMATROPINE 5-1.5 MG/5ML PO SYRP: 5.0000 mL | ORAL_SOLUTION | Freq: Three times a day (TID) | ORAL | 0 refills | Status: DC | PRN

## 2017-02-14 MED ORDER — PREDNISONE 5 MG (48) PO TBPK: ORAL_TABLET | ORAL | 0 refills | Status: DC

## 2017-02-14 MED ORDER — AZITHROMYCIN 250 MG PO TABS: 250.0000 mg | ORAL_TABLET | Freq: Every day | ORAL | 0 refills | Status: DC

## 2017-02-14 NOTE — Progress Notes (Signed)
[name] is a 83 y.o. female who presents to [facility] [name]: Primary Care Sports Medicine today for worsening shortness of breath and fatigue. Patient has been feeling poorly the last several weeks requiring treatment for pneumonia and completing 2 separate courses of prednisone.   This morning, patient work up feeling especially poorly and was unable to get out of bed. Patient describes feeling of fatigue and shortness of breath. She states that her shortness of breath has been significantly worse than her baseline, requiring her to stop to catch her breath while walking. [name] states that she woke up this morning particularly short of breath and even struggled to fall asleep last night due to her cough/SOB. Patient does have underlying heart failure, but today she feels particularly poorly. Otherwise, patient does not have any acute complaints. She does have some sensitivity in her lower extremities but feels that her legs are only mildly swollen.   Otherwise, patient complaining of worsening cough. She has had cough for approximately 2 weeks and it seemed to worsen overnight, per patient.  Patient presents today as she feels particularly poorly today, with worsening shortness of breath and cough.   Past Medical History:  Diagnosis Date  . Acute pancreatitis   . Anemia   . Arthritis   . Atrial fibrillation (HCC)   . CAD (coronary artery disease)   . Carotid artery occlusion   . CHF (congestive heart failure) (HCC)   . Diabetes mellitus age 24  . DJD (degenerative joint disease)   . DVT (deep venous thrombosis) (HCC)   . GERD (gastroesophageal reflux disease)   . GI bleed   . Hyperlipidemia   . Hypertension   . Joint pain   . Mesenteric ischemia   . Peripheral vascular disease (HCC)   . Thyroid disease    Past Surgical History:  Procedure Laterality Date  . APPENDECTOMY    . CELIAC  ARTERY STENT    . CORONARY ARTERY BYPASS GRAFT    . EMBOLECTOMY  2009   right leg  . FASCIOTOMY     right leg  . HIP FRACTURE SURGERY Left   . JOINT REPLACEMENT    . TOTAL KNEE ARTHROPLASTY     bilateral  . TUBAL LIGATION     Social History   Tobacco Use  . Smoking status: Never Smoker  . Smokeless tobacco: Never Used  Substance Use Topics  . Alcohol use: No   family history includes Cancer in her daughter and mother; Coronary artery disease in her sister; Diabetes in her daughter, sister, and son; Heart disease in her sister; Hyperlipidemia in her son; Hypertension in her daughter and son; Other in her father and sister.  ROS as above:  Medications: Current Outpatient Medications  Medication Sig Dispense Refill  . AMBULATORY NON FORMULARY MEDICATION Please check INR weekly for 2 months. Send reports to [name] fax [phone] 1 each 0  . apixaban (ELIQUIS) 2.5 MG TABS tablet Take 1 tablet (2.5 mg total) by mouth 2 (two) times daily. Take in substitution of warfarin if affordable 180 tablet 1  . Calcium Carb-Cholecalciferol 600-800 MG-UNIT TABS TAKE ONE TABLET BY MOUTH 2 TIMES A DAY 60 tablet 6  . clonazePAM (KLONOPIN) 0.5 MG tablet Take 1 tablet (0.5 mg total) by mouth at bedtime. 90 tablet 1  . conjugated estrogens (PREMARIN) vaginal cream Place 1 Applicatorful vaginally daily. 42.5 g 12  . diltiazem (CARDIZEM CD) 180 MG 24 hr capsule  Take 1 capsule (180 mg total) by mouth at bedtime. 90 capsule 3  . Ergocalciferol (VITAMIN D2) 2000 units TABS Take 2,000 Units by mouth daily.     . folic acid (FOLVITE) 1 MG tablet Take 1 tablet (1 mg total) by mouth daily. 90 tablet 3  . glucose blood test strip Once daily E11.29 100 each 12  . iron polysaccharides (FERREX 150) 150 MG capsule Take 1 capsule (150 mg total) by mouth daily. 90 capsule 3  . leflunomide (ARAVA) 10 MG tablet Take 1 tablet (10 mg total) by mouth at bedtime. 90 tablet 1  . levothyroxine (SYNTHROID, LEVOTHROID) 50  MCG tablet Take 1 tablet (50 mcg total) by mouth daily. 90 tablet 1  . metoprolol succinate (TOPROL-XL) 50 MG 24 hr tablet Take 1 tablet (50 mg total) by mouth daily. 90 tablet 3  . mirtazapine (REMERON) 7.5 MG tablet Take 1 tablet (7.5 mg total) by mouth at bedtime. 30 tablet 0  . pravastatin (PRAVACHOL) 40 MG tablet Take 1 tablet (40 mg total) by mouth daily. 90 tablet 1  . STOOL SOFTENER 100 MG capsule TAKE 1 CAPSULE BY MOUTH 2 TIMES A DAY (AM & PM) (Patient taking differently: TAKE 100 MG BY MOUTH 2 TIMES A DAY (AM & PM)) 56 capsule 0  . torsemide (DEMADEX) 100 MG tablet Take 0.5 tablets (50 mg total) by mouth 2 (two) times daily. 90 tablet 1  . traMADol (ULTRAM) 50 MG tablet Take 1 tablet (50 mg total) by mouth 2 (two) times daily as needed. Please fax to [facility]. 180 tablet 1  . traZODone (DESYREL) 50 MG tablet Take 0.5-1 tablets (25-50 mg total) by mouth at bedtime as needed for sleep. 90 tablet 0  . vitamin B-12 (CYANOCOBALAMIN) 1000 MCG tablet Take 1,000 mcg by mouth daily.    Marland Kitchen azithromycin (ZITHROMAX) 250 MG tablet Take 1 tablet (250 mg total) daily by mouth. Take first 2 tablets together, then 1 every day until finished. 6 tablet 0  . HYDROcodone-homatropine (HYCODAN) 5-1.5 MG/5ML syrup Take 5 mLs every 8 (eight) hours as needed by mouth for cough. 120 mL 0  . predniSONE (STERAPRED UNI-PAK 48 TAB) 5 MG (48) TBPK tablet 12 day dosepack po 48 tablet 0   No current facility-administered medications for this visit.    Allergies  Allergen Reactions  . Cefuroxime Anaphylaxis and Other (See Comments)    Throat swelling  . Fish Allergy Shortness Of Breath    Pt said she has throat swelling   . Ciprofloxacin Rash  . Cephalexin Rash  . Codeine Other (See Comments)    Jittery  . Zoloft [Sertraline Hcl] Rash    Health Maintenance Health Maintenance  Topic Date Due  . URINE MICROALBUMIN  01/22/1940  . OPHTHALMOLOGY EXAM  07/13/2016  . HEMOGLOBIN A1C  05/22/2017  . FOOT EXAM   07/26/2017  . TETANUS/TDAP  12/28/2025  . INFLUENZA VACCINE  Completed  . PNA vac Low Risk Adult  Completed    Exam:  BP (!) 192/99   Pulse (!) 130   Temp 98.7 F (37.1 C) (Oral)   Resp 20   Wt 153 lb (69.4 kg)   SpO2 96%   BMI 27.98 kg/m  Gen: Patient appears ill, tired HEENT: EOMI,  MMM, lips without signs of cyanosis Lungs: Increased work of breathing, mildly tachypnic, no crackles at bases Heart: tachycardic, irregularly irregular rhythm  Abd: NABS, Soft. Nondistended, Nontender Exts: Brisk capillary refill, warm and well perfused.   EKG:  Atrial fibrillation with RVR, rates 130s  CXR:  Airway midline without shift, enlarged cardiac silhouette, no acute airway consolidation, mild cephalization and peribronchial cuffing. Follow-up radiology read.  No results found for this or any previous visit (from the past 72 hour(s)). Dg Chest 2 View  Result Date: 02/14/2017 CLINICAL DATA:  Cough EXAM: CHEST  2 VIEW COMPARISON:  01/30/2017, 05/17/2016 FINDINGS: Post sternotomy changes. Coarse chronic interstitial opacity. No focal consolidation or effusion. Stable enlarged cardiomediastinal silhouette with tortuous calcified aorta. No pneumothorax. Elevation of the left diaphragm, chronic. Re- demonstrated thoracic compression deformity. IMPRESSION: 1. No acute infiltrate or edema 2. Cardiomegaly 3. Coarse chronic interstitial opacity Electronically Signed   By: Jasmine Pang M.D.   On: 02/14/2017 22:51    Assessment and Plan: 83 y.o. female with worsening shortness of breath and cough. Patient is in atrial fibrillation with mild RVR in the setting of missed medication dosing this morning. Patient rate typically well controlled with diltiazem and metoprolol. Cheryl Inocencio is short of breath, but is still able to ambulate. EKG shows atrial fibrillation with RVR, but no significant alveolar fluid on CXR concerning for acute HF exacerbation and no consolidation concerning for pneumonia.    Discussed management and contingency plan at length with patient. Currently patient does not absolutely require acute hospitalization, but if patient decompensates or becomes acutely worse, she should present to ED. Patient prefers to stay out of hospital now, which is reasonable at this point with stable vital signs. Patient counseled to go straight to ED if worsens.   In the short term, patient will be treated with azithromycin for possibility of CAP. Also prescribed short course of prednisone as patient's shortness of breath has responded well to steroids in the past. Finally, prescribed hydrocodone cough syrup as patient's cough has been significantly bothersome. She will take her normal medications today included metoprolol and diltiazem, as well as apixaban for clot prophylaxis.   Cheryl Oldenburg will follow up in clinic tomorrow to ensure stability and progression. She was instructed to go straight to the emergency room tonight if shortness of breath worsens.   Orders Placed This Encounter  Procedures  . DG Chest 2 View    Order Specific Question:   Reason for exam:    Answer:   Cough, assess intra-thoracic pathology    Order Specific Question:   Preferred imaging location?    Answer:   [facility]  . EKG 12-Lead   Meds ordered this encounter  Medications  . HYDROcodone-homatropine (HYCODAN) 5-1.5 MG/5ML syrup    Sig: Take 5 mLs every 8 (eight) hours as needed by mouth for cough.    Dispense:  120 mL    Refill:  0  . predniSONE (STERAPRED UNI-PAK 48 TAB) 5 MG (48) TBPK tablet    Sig: 12 day dosepack po    Dispense:  48 tablet    Refill:  0  . azithromycin (ZITHROMAX) 250 MG tablet    Sig: Take 1 tablet (250 mg total) daily by mouth. Take first 2 tablets together, then 1 every day until finished.    Dispense:  6 tablet    Refill:  0    Discussed warning signs or symptoms. Please see discharge instructions. Patient expresses understanding.  I spent 25 minutes with this  patient, greater than 50% was face-to-face time counseling regarding ddx and treatment plan. Marland Kitchen

## 2017-02-14 NOTE — Patient Instructions (Signed)
Thank you for coming in today. Recheck tomorrow as scheduled. If worse go to the ER.  Take prednisone and azithromycin.  USe hycodan cough medication as needed sparingly.   Call or go to the emergency room if you get worse, have trouble breathing, have chest pains, or palpitations.   Take your home medicines.    Acute Bronchitis, Adult Acute bronchitis is when air tubes (bronchi) in the lungs suddenly get swollen. The condition can make it hard to breathe. It can also cause these symptoms:  A cough.  Coughing up clear, yellow, or green mucus.  Wheezing.  Chest congestion.  Shortness of breath.  A fever.  Body aches.  Chills.  A sore throat.  Follow these instructions at home: Medicines  Take over-the-counter and prescription medicines only as told by your doctor.  If you were prescribed an antibiotic medicine, take it as told by your doctor. Do not stop taking the antibiotic even if you start to feel better. General instructions  Rest.  Drink enough fluids to keep your pee (urine) clear or pale yellow.  Avoid smoking and secondhand smoke. If you smoke and you need help quitting, ask your doctor. Quitting will help your lungs heal faster.  Use an inhaler, cool mist vaporizer, or humidifier as told by your doctor.  Keep all follow-up visits as told by your doctor. This is important. How is this prevented? To lower your risk of getting this condition again:  Wash your hands often with soap and water. If you cannot use soap and water, use hand sanitizer.  Avoid contact with people who have cold symptoms.  Try not to touch your hands to your mouth, nose, or eyes.  Make sure to get the flu shot every year.  Contact a doctor if:  Your symptoms do not get better in 2 weeks. Get help right away if:  You cough up blood.  You have chest pain.  You have very bad shortness of breath.  You become dehydrated.  You faint (pass out) or keep feeling like you are  going to pass out.  You keep throwing up (vomiting).  You have a very bad headache.  Your fever or chills gets worse. This information is not intended to replace advice given to you by your health care provider. Make sure you discuss any questions you have with your health care provider. Document Released: 09/14/2007 Document Revised: 11/04/2015 Document Reviewed: 09/16/2015 Elsevier Interactive Patient Education  2017 [employer].

## 2017-02-15 ENCOUNTER — Ambulatory Visit (INDEPENDENT_AMBULATORY_CARE_PROVIDER_SITE_OTHER): Payer: Medicare PPO | Admitting: Family Medicine

## 2017-02-15 ENCOUNTER — Encounter: Payer: Self-pay | Admitting: Family Medicine

## 2017-02-15 VITALS — BP 144/84 | HR 100 | Temp 98.1°F

## 2017-02-15 DIAGNOSIS — I482 Chronic atrial fibrillation, unspecified: Secondary | ICD-10-CM

## 2017-02-15 DIAGNOSIS — R05 Cough: Secondary | ICD-10-CM | POA: Diagnosis not present

## 2017-02-15 DIAGNOSIS — R059 Cough, unspecified: Secondary | ICD-10-CM

## 2017-02-15 NOTE — Patient Instructions (Addendum)
Thank you for coming in today. Recheck with me in 1 month.  Return sooner if needed.

## 2017-02-15 NOTE — Progress Notes (Signed)
[name] is a 83 y.o. female who presents to [name] today for follow-up shortness of breath.  [name] was seen yesterday for cough congestion and tachycardia.  In the interim she is done extremely well with starting her home medications for rate control as well as prednisone and antibiotics.  She feels quite well with no fevers or chills.   Past Medical History:  Diagnosis Date  . Acute pancreatitis   . Anemia   . Arthritis   . Atrial fibrillation (HCC)   . CAD (coronary artery disease)   . Carotid artery occlusion   . CHF (congestive heart failure) (HCC)   . Diabetes mellitus age 11  . DJD (degenerative joint disease)   . DVT (deep venous thrombosis) (HCC)   . GERD (gastroesophageal reflux disease)   . GI bleed   . Hyperlipidemia   . Hypertension   . Joint pain   . Mesenteric ischemia   . Peripheral vascular disease (HCC)   . Thyroid disease    Past Surgical History:  Procedure Laterality Date  . APPENDECTOMY    . CELIAC ARTERY STENT    . CORONARY ARTERY BYPASS GRAFT    . EMBOLECTOMY  2009   right leg  . FASCIOTOMY     right leg  . HIP FRACTURE SURGERY Left   . JOINT REPLACEMENT    . TOTAL KNEE ARTHROPLASTY     bilateral  . TUBAL LIGATION     Social History   Tobacco Use  . Smoking status: Never Smoker  . Smokeless tobacco: Never Used  Substance Use Topics  . Alcohol use: No   family history includes Cancer in her daughter and mother; Coronary artery disease in her sister; Diabetes in her daughter, sister, and son; Heart disease in her sister; Hyperlipidemia in her son; Hypertension in her daughter and son; Other in her father and sister.  ROS as above:  Medications: Current Outpatient Medications  Medication Sig Dispense Refill  . AMBULATORY NON FORMULARY MEDICATION Please check INR weekly for 2 months. Send reports to Dr  Denyse Amass fax [phone] 1 each 0  . apixaban (ELIQUIS) 2.5 MG TABS tablet Take 1 tablet (2.5 mg total) by mouth 2 (two) times daily. Take in substitution of warfarin if affordable 180 tablet 1  . azithromycin (ZITHROMAX) 250 MG tablet Take 1 tablet (250 mg total) daily by mouth. Take first 2 tablets together, then 1 every day until finished. 6 tablet 0  . Calcium Carb-Cholecalciferol 600-800 MG-UNIT TABS TAKE ONE TABLET BY MOUTH 2 TIMES A DAY 60 tablet 6  . clonazePAM (KLONOPIN) 0.5 MG tablet Take 1 tablet (0.5 mg total) by mouth at bedtime. 90 tablet 1  . conjugated estrogens (PREMARIN) vaginal cream Place 1 Applicatorful vaginally daily. 42.5 g 12  . diltiazem (CARDIZEM CD) 180 MG 24 hr capsule Take 1 capsule (180 mg total) by mouth at bedtime. 90 capsule 3  . Ergocalciferol (VITAMIN D2) 2000 units TABS Take 2,000 Units by mouth daily.     . folic acid (FOLVITE) 1 MG tablet Take 1 tablet (1 mg total) by mouth daily. 90 tablet 3  . glucose blood test strip Once daily E11.29 100 each 12  . HYDROcodone-homatropine (HYCODAN) 5-1.5 MG/5ML syrup Take 5 mLs every 8 (eight) hours as needed by mouth for cough. 120 mL 0  . iron polysaccharides (FERREX 150) 150 MG capsule Take 1 capsule (150 mg total) by mouth daily. 90  capsule 3  . leflunomide (ARAVA) 10 MG tablet Take 1 tablet (10 mg total) by mouth at bedtime. 90 tablet 1  . levothyroxine (SYNTHROID, LEVOTHROID) 50 MCG tablet Take 1 tablet (50 mcg total) by mouth daily. 90 tablet 1  . metoprolol succinate (TOPROL-XL) 50 MG 24 hr tablet Take 1 tablet (50 mg total) by mouth daily. 90 tablet 3  . mirtazapine (REMERON) 7.5 MG tablet Take 1 tablet (7.5 mg total) by mouth at bedtime. 30 tablet 0  . pravastatin (PRAVACHOL) 40 MG tablet Take 1 tablet (40 mg total) by mouth daily. 90 tablet 1  . predniSONE (STERAPRED UNI-PAK 48 TAB) 5 MG (48) TBPK tablet 12 day dosepack po 48 tablet 0  . STOOL SOFTENER 100 MG capsule TAKE 1 CAPSULE BY MOUTH 2 TIMES A DAY (AM &  PM) (Patient taking differently: TAKE 100 MG BY MOUTH 2 TIMES A DAY (AM & PM)) 56 capsule 0  . torsemide (DEMADEX) 100 MG tablet Take 0.5 tablets (50 mg total) by mouth 2 (two) times daily. 90 tablet 1  . traMADol (ULTRAM) 50 MG tablet Take 1 tablet (50 mg total) by mouth 2 (two) times daily as needed. Please fax to [facility]. 180 tablet 1  . traZODone (DESYREL) 50 MG tablet Take 0.5-1 tablets (25-50 mg total) by mouth at bedtime as needed for sleep. 90 tablet 0  . vitamin B-12 (CYANOCOBALAMIN) 1000 MCG tablet Take 1,000 mcg by mouth daily.     No current facility-administered medications for this visit.    Allergies  Allergen Reactions  . Cefuroxime Anaphylaxis and Other (See Comments)    Throat swelling  . Fish Allergy Shortness Of Breath    Pt said she has throat swelling   . Ciprofloxacin Rash  . Cephalexin Rash  . Codeine Other (See Comments)    Jittery  . Zoloft [Sertraline Hcl] Rash    Health Maintenance Health Maintenance  Topic Date Due  . URINE MICROALBUMIN  01/22/1940  . OPHTHALMOLOGY EXAM  07/13/2016  . HEMOGLOBIN A1C  05/22/2017  . FOOT EXAM  07/26/2017  . TETANUS/TDAP  12/28/2025  . INFLUENZA VACCINE  Completed  . PNA vac Low Risk Adult  Completed     Exam:  BP (!) 144/84   Pulse 100   Temp 98.1 F (36.7 C) (Oral)   SpO2 96%  Gen: Well NAD HEENT: EOMI,  MMM clear nasal discharge. Lungs: Normal work of breathing. CTABL Heart: Irreg irreg no MRG Abd: NABS, Soft. Nondistended, Nontender Exts: Brisk capillary refill, warm and well perfused.    No results found for this or any previous visit (from the past 72 hour(s)). Dg Chest 2 View  Result Date: 02/14/2017 CLINICAL DATA:  Cough EXAM: CHEST  2 VIEW COMPARISON:  01/30/2017, 05/17/2016 FINDINGS: Post sternotomy changes. Coarse chronic interstitial opacity. No focal consolidation or effusion. Stable enlarged cardiomediastinal silhouette with tortuous calcified aorta. No pneumothorax. Elevation of the left  diaphragm, chronic. Re- demonstrated thoracic compression deformity. IMPRESSION: 1. No acute infiltrate or edema 2. Cardiomegaly 3. Coarse chronic interstitial opacity Electronically Signed   By: Jasmine Pang M.D.   On: 02/14/2017 22:51      Assessment and Plan: 83 y.o. female with resolving cough and congestion likely due to bronchitis.  Atrial fibrillation with RVR yesterday likely due to her not taking her medications.  Continue current regimen and recheck in 1 month.  Return sooner if needed.   No orders of the defined types were placed in this encounter.  No orders  of the defined types were placed in this encounter.    Discussed warning signs or symptoms. Please see discharge instructions. Patient expresses understanding.

## 2017-02-17 DIAGNOSIS — I4891 Unspecified atrial fibrillation: Secondary | ICD-10-CM | POA: Diagnosis not present

## 2017-02-17 DIAGNOSIS — E1151 Type 2 diabetes mellitus with diabetic peripheral angiopathy without gangrene: Secondary | ICD-10-CM | POA: Diagnosis not present

## 2017-02-17 DIAGNOSIS — N390 Urinary tract infection, site not specified: Secondary | ICD-10-CM | POA: Diagnosis not present

## 2017-02-17 DIAGNOSIS — D649 Anemia, unspecified: Secondary | ICD-10-CM | POA: Diagnosis not present

## 2017-02-17 DIAGNOSIS — I509 Heart failure, unspecified: Secondary | ICD-10-CM | POA: Diagnosis not present

## 2017-02-17 DIAGNOSIS — I110 Hypertensive heart disease with heart failure: Secondary | ICD-10-CM | POA: Diagnosis not present

## 2017-02-17 DIAGNOSIS — I2510 Atherosclerotic heart disease of native coronary artery without angina pectoris: Secondary | ICD-10-CM | POA: Diagnosis not present

## 2017-02-17 DIAGNOSIS — M069 Rheumatoid arthritis, unspecified: Secondary | ICD-10-CM | POA: Diagnosis not present

## 2017-02-21 ENCOUNTER — Ambulatory Visit: Payer: Medicare PPO | Admitting: Family

## 2017-02-21 ENCOUNTER — Ambulatory Visit (HOSPITAL_COMMUNITY)
Admission: RE | Admit: 2017-02-21 | Discharge: 2017-02-21 | Disposition: A | Discharge status: Home / Self Care | Payer: Medicare PPO | Source: Ambulatory Visit | Attending: Family | Admitting: Family

## 2017-02-21 ENCOUNTER — Encounter: Payer: Self-pay | Admitting: Family

## 2017-02-21 ENCOUNTER — Ambulatory Visit (INDEPENDENT_AMBULATORY_CARE_PROVIDER_SITE_OTHER)
Admission: RE | Admit: 2017-02-21 | Discharge: 2017-02-21 | Disposition: A | Discharge status: Home / Self Care | Payer: Medicare PPO | Source: Ambulatory Visit | Attending: Family | Admitting: Family

## 2017-02-21 VITALS — BP 175/77 | HR 82 | Temp 98.2°F | Resp 18 | Ht 62.0 in | Wt 158.0 lb

## 2017-02-21 DIAGNOSIS — Z959 Presence of cardiac and vascular implant and graft, unspecified: Secondary | ICD-10-CM | POA: Diagnosis not present

## 2017-02-21 DIAGNOSIS — I771 Stricture of artery: Secondary | ICD-10-CM

## 2017-02-21 DIAGNOSIS — I6523 Occlusion and stenosis of bilateral carotid arteries: Secondary | ICD-10-CM

## 2017-02-21 DIAGNOSIS — K55069 Acute infarction of intestine, part and extent unspecified: Secondary | ICD-10-CM

## 2017-02-21 DIAGNOSIS — I714 Abdominal aortic aneurysm, without rupture, unspecified: Secondary | ICD-10-CM

## 2017-02-21 DIAGNOSIS — I774 Celiac artery compression syndrome: Secondary | ICD-10-CM | POA: Diagnosis not present

## 2017-02-21 LAB — VAS US CAROTID
LEFT ECA DIAS: -12 cm/s
LEFT VERTEBRAL DIAS: 14 cm/s
LICAPDIAS: 76 cm/s
Left CCA dist dias: -24 cm/s
Left CCA dist sys: -120 cm/s
Left CCA prox dias: 26 cm/s
Left CCA prox sys: 96 cm/s
Left ICA dist dias: -24 cm/s
Left ICA dist sys: -88 cm/s
Left ICA prox sys: 281 cm/s
RCCADSYS: -178 cm/s
RCCAPDIAS: 24 cm/s
RCCAPSYS: 108 cm/s
RIGHT CCA MID DIAS: 21 cm/s
RIGHT ECA DIAS: -17 cm/s
RIGHT VERTEBRAL DIAS: -10 cm/s

## 2017-02-21 NOTE — Patient Instructions (Signed)
Before your next abdominal ultrasound:  Take two Extra-Strength Gas-X capsules at bedtime the night before the test. Take another two Extra-Strength Gas-X capsules 3 hours before the test.  Avoid gas forming foods the day before the test.      Stroke Prevention Some health problems and behaviors may make it more likely for you to have a stroke. Below are ways to lessen your risk of having a stroke.  Be active for at least 30 minutes on most or all days.  Do not smoke. Try not to be around others who smoke.  Do not drink too much alcohol. ? Do not have more than 2 drinks a day if you are a man. ? Do not have more than 1 drink a day if you are a woman and are not pregnant.  Eat healthy foods, such as fruits and vegetables. If you were put on a specific diet, follow the diet as told.  Keep your cholesterol levels under control through diet and medicines. Look for foods that are low in saturated fat, trans fat, cholesterol, and are high in fiber.  If you have diabetes, follow all diet plans and take your medicine as told.  Ask your doctor if you need treatment to lower your blood pressure. If you have high blood pressure (hypertension), follow all diet plans and take your medicine as told by your doctor.  If you are 61-86 years old, have your blood pressure checked every 3-5 years. If you are age 83 or older, have your blood pressure checked every year.  Keep a healthy weight. Eat foods that are low in calories, salt, saturated fat, trans fat, and cholesterol.  Do not take drugs.  Avoid birth control pills, if this applies. Talk to your doctor about the risks of taking birth control pills.  Talk to your doctor if you have sleep problems (sleep apnea).  Take all medicine as told by your doctor. ? You may be told to take aspirin or blood thinner medicine. Take this medicine as told by your doctor. ? Understand your medicine instructions.  Make sure any other conditions you have are  being taken care of.  Get help right away if:  You suddenly lose feeling (you feel numb) or have weakness in your face, arm, or leg.  Your face or eyelid hangs down to one side.  You suddenly feel confused.  You have trouble talking (aphasia) or understanding what people are saying.  You suddenly have trouble seeing in one or both eyes.  You suddenly have trouble walking.  You are dizzy.  You lose your balance or your movements are clumsy (uncoordinated).  You suddenly have a very bad headache and you do not know the cause.  You have new chest pain.  Your heart feels like it is fluttering or skipping a beat (irregular heartbeat). Do not wait to see if the symptoms above go away. Get help right away. Call your local emergency services (911 in [place] Do not drive yourself to the hospital. This information is not intended to replace advice given to you by your health care provider. Make sure you discuss any questions you have with your health care provider. Document Released: 09/27/2011 Document Revised: 09/03/2015 Document Reviewed: 09/28/2012 Elsevier Interactive Patient Education  2018 [employer].     Chronic Mesenteric Ischemia Mesenteric ischemia is poor blood flow (circulation) in the vessels that supply blood to the stomach, intestines, and liver (mesenteric organs). Chronic mesenteric ischemia, also called mesenteric angina or intestinal  angina, is a long-term (chronic) condition. It happens when an artery or vein that provides blood to the mesenteric organs gradually becomes blocked or narrow, restricting the blood supply to the organs. When the blood supply is severely restricted, the mesenteric organs cannot work properly. What are the causes? This condition is commonly caused by fatty deposits that build up in an artery (plaque), which can narrow the artery and restrict blood flow. Other causes include:  Weakened areas in blood vessel walls (aneurysms).  Conditions  that cause twisting or inflammation of blood vessels, such as fibromuscular dysplasia or arteritis.  A disorder in which blood clots form in the veins (venous thrombosis).  Scarring and thickening (fibrosis) of blood vessels caused by radiation therapy.  A tear in the aorta, the body's main artery (aortic dissection).  Blood vessel problems after illegal drug use, such as use of cocaine.  Tumors in the nervous system (neurofibromatosis).  Certain autoimmune diseases, such as lupus.  What increases the risk? The following factors may make you more likely to develop this condition:  Being female.  Being over age 81, especially if you have a history of heart problems.  Smoking.  Congestive heart failure.  Irregular heartbeat (arrhythmia).  Having a history of heart attack or stroke.  Diabetes.  High cholesterol.  High blood pressure (hypertension).  Being overweight or obese.  Kidney disease (renal disease) requiring dialysis.  What are the signs or symptoms? Symptoms of this condition include:  Abdomen (abdominal) pain or cramps that develop 15-60 minutes after a meal. This pain may last for 1-3 hours. Some people may develop a fear of eating because of this symptom.  Weight loss.  Diarrhea.  Bloody stool.  Nausea.  Vomiting.  Bloating.  Abdominal pain after stress or with exercise.  How is this diagnosed? This condition is diagnosed based on:  Your medical history.  A physical exam.  Tests, such as: ? Ultrasound. ? CT scan. ? Blood tests. ? Urine tests. ? An imaging test that involves injecting a dye into your arteries to show blood flow through blood vessels (angiogram). This can help to show if there are any blockages in the vessels that lead to the intestines. ? Passing a small probe through the mouth and into the stomach to measure the output of carbon dioxide (gastric tonometry). This can help to indicate whether there is decreased blood flow  to the stomach and intestines.  How is this treated? This condition may be treated with:  Dietary changes such as eating smaller, low-fat, meals more frequently.  Lifestyle changes to treat underlying conditions that contribute to the disease, such as high cholesterol and high blood pressure.  Medicines to reduce blood clotting and increase blood flow.  Surgery to remove the blockage, repair arteries or veins, and restore blood flow. This may involve: ? Angioplasty. This is surgery to widen the affected artery, reduce the blockage, and sometimes insert a small, mesh tube (stent). ? Bypass surgery. This may be done to go around (bypass) the blockage and reconnect healthy arteries or veins. ? Placing a stent in the affected area. This may be done to help keep blocked arteries open.  Follow these instructions at home: Eating and drinking  Eat a heart-healthy diet. This includes fresh fruits and vegetables, whole grains, and lean proteins like chicken, fish, eggs, and beans.  Avoid foods that contain a lot of: ? Salt (sodium). ? Sugar. ? Saturated fat (such as red meat). ? Trans fat (such as fried  foods).  Stay hydrated. Drink enough fluid to keep your urine clear or pale yellow. Lifestyle  Stay active and get regular exercise as told by your health care provider. Aim for 150 minutes of moderate activity or 75 minutes of vigorous activity a week. Ask your health care provider what activities and forms of exercise are safe for you.  Maintain a healthy weight.  Work with your health care provider to manage your cholesterol.  Manage any other health problems you have, such as high blood pressure, diabetes, or heart rhythm problems.  Do not use any products that contain nicotine or tobacco, such as cigarettes and e-cigarettes. If you need help quitting, ask your health care provider. General instructions  Take over-the-counter and prescription medicines only as told by your health  care provider.  Keep all follow-up visits as told by your health care provider. This is important. Contact a health care provider if:  Your symptoms do not improve or they return after treatment.  You have a fever. Get help right away if:  You have severe abdominal pain.  You have severe chest pain.  You have shortness of breath.  You feel weak or dizzy.  You have palpitations.  You have numbness or weakness in your face, arm, or leg.  You are confused.  You have trouble speaking or people have trouble understanding what you are saying.  You are constipated.  You have trouble urinating.  You have blood in your stool.  You have severe nausea, vomiting, or persistent diarrhea. Summary  Mesenteric ischemia is poor circulation in the vessels that supply blood to the the stomach, intestines, and liver (mesenteric organs).  This condition happens when an artery or vein that provides blood to the mesenteric organs gradually becomes blocked or narrow, restricting the blood supply to the organs.  This condition is commonly caused by fatty deposits that build up in an artery (plaque), which can narrow the artery and restrict blood flow.  You are more likely to develop this condition if you are over age 56 and have a history of heart problems, high blood pressure, diabetes, or high cholesterol.  This condition is usually treated with medicines, dietary and lifestyle changes, and surgery to remove the blockage, repair arteries or veins, and restore blood flow. This information is not intended to replace advice given to you by your health care provider. Make sure you discuss any questions you have with your health care provider. Document Released: 11/15/2010 Document Revised: 03/12/2016 Document Reviewed: 03/12/2016 Elsevier Interactive Patient Education  2017 [employer].

## 2017-02-21 NOTE — Progress Notes (Signed)
Chief Complaint: Follow up Chronic Mesenteric Artery Stenosis and Extracranial Carotid Artery Stenosis    History of Present Illness  [name] is a 83 y.o. female patient of Dr. Darrick Penna who is s/p celiac artery stent placed 09/02/08 and restented 05/31/13. 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  Since the above procedure pt states she has had rare post prandial abdominal pain (usually after eating spicy foods) other than what seems like bloating for 30-90 minutes after eating. She did have post prandial abdominal pain prior to this.  She returns today for routine surveillance.  She is currently living at [facility]. She is on Eliquis for atrial fibrillation. Other chronic medical problems include diabetes hypertension congestive heart failure degenerative joint disease.   Pt states 180/70 was her blood pressure this morning, but has not yet taken her blood pressure medications; daughter states it usually runs about 140's/70's. .   She fell in January 2017, fractured her left hip, had an ORIF at [facility].  She has had both knees replaced. Her walking seems limited by her OA and RA issues, not claudication.  The RA in her hands is bothering her more than any other issue.  Pt denies any history of stroke or TIA. She takes a daily statin. Pt denies any history of MI, but has a history of CABG x3 vessels.  She has never used tobacco, but she was exposed to her husband's second hand smoke for 37 years until he died in 49.  Pt reports DM neuropathy as manifested by shooting pains from feet that travel upwards at times, mostly at night.  She states her DM is diet controlled. However, her A1C on 11-19-16 was 8.1 (review of records). She is not taking any DM medications. She has been taking prednisone and antibx recently for acute bronchitis.  She also takes prednisone occassionally to help address her RA.  She denies non  healing wounds.    Past Medical History:  Diagnosis Date  . Acute pancreatitis   . Anemia   . Arthritis   . Atrial fibrillation (HCC)   . CAD (coronary artery disease)   . Carotid artery occlusion   . CHF (congestive heart failure) (HCC)   . Diabetes mellitus age 49  . DJD (degenerative joint disease)   . DVT (deep venous thrombosis) (HCC)   . GERD (gastroesophageal reflux disease)   . GI bleed   . Hyperlipidemia   . Hypertension   . Joint pain   . Mesenteric ischemia   . Peripheral vascular disease (HCC)   . Thyroid disease     Social History Social History   Tobacco Use  . Smoking status: Never Smoker  . Smokeless tobacco: Never Used  Substance Use Topics  . Alcohol use: No  . Drug use: No    Family History Family History  Problem Relation Age of Onset  . Cancer Mother        ovarian  . Other Father        suicide  . Coronary artery disease Sister   . Other Sister        alzheimers  . Heart disease Sister        Before age 81  . Diabetes Sister   . Cancer Daughter        spinal tumor  . Diabetes Daughter   . Hypertension Daughter   . Diabetes Son   . Hyperlipidemia Son   . Hypertension Son  Surgical History Past Surgical History:  Procedure Laterality Date  . APPENDECTOMY    . CELIAC ARTERY STENT    . CORONARY ARTERY BYPASS GRAFT    . EMBOLECTOMY  2009   right leg  . FASCIOTOMY     right leg  . HIP FRACTURE SURGERY Left   . JOINT REPLACEMENT    . TOTAL KNEE ARTHROPLASTY     bilateral  . TUBAL LIGATION      Allergies  Allergen Reactions  . Cefuroxime Anaphylaxis and Other (See Comments)    Throat swelling  . Fish Allergy Shortness Of Breath    Pt said she has throat swelling   . Ciprofloxacin Rash  . Cephalexin Rash  . Codeine Other (See Comments)    Jittery  . Zoloft [Sertraline Hcl] Rash    Current Outpatient Medications  Medication Sig Dispense Refill  . AMBULATORY NON FORMULARY MEDICATION Please check INR weekly for 2  months. Send reports to Dr Denyse Amass fax [phone] 1 each 0  . apixaban (ELIQUIS) 2.5 MG TABS tablet Take 1 tablet (2.5 mg total) by mouth 2 (two) times daily. Take in substitution of warfarin if affordable 180 tablet 1  . azithromycin (ZITHROMAX) 250 MG tablet Take 1 tablet (250 mg total) daily by mouth. Take first 2 tablets together, then 1 every day until finished. 6 tablet 0  . Calcium Carb-Cholecalciferol 600-800 MG-UNIT TABS TAKE ONE TABLET BY MOUTH 2 TIMES A DAY 60 tablet 6  . clonazePAM (KLONOPIN) 0.5 MG tablet Take 1 tablet (0.5 mg total) by mouth at bedtime. 90 tablet 1  . conjugated estrogens (PREMARIN) vaginal cream Place 1 Applicatorful vaginally daily. 42.5 g 12  . diltiazem (CARDIZEM CD) 180 MG 24 hr capsule Take 1 capsule (180 mg total) by mouth at bedtime. 90 capsule 3  . Ergocalciferol (VITAMIN D2) 2000 units TABS Take 2,000 Units by mouth daily.     . folic acid (FOLVITE) 1 MG tablet Take 1 tablet (1 mg total) by mouth daily. 90 tablet 3  . glucose blood test strip Once daily E11.29 100 each 12  . HYDROcodone-homatropine (HYCODAN) 5-1.5 MG/5ML syrup Take 5 mLs every 8 (eight) hours as needed by mouth for cough. 120 mL 0  . iron polysaccharides (FERREX 150) 150 MG capsule Take 1 capsule (150 mg total) by mouth daily. 90 capsule 3  . leflunomide (ARAVA) 10 MG tablet Take 1 tablet (10 mg total) by mouth at bedtime. 90 tablet 1  . levothyroxine (SYNTHROID, LEVOTHROID) 50 MCG tablet Take 1 tablet (50 mcg total) by mouth daily. 90 tablet 1  . metoprolol succinate (TOPROL-XL) 50 MG 24 hr tablet Take 1 tablet (50 mg total) by mouth daily. 90 tablet 3  . mirtazapine (REMERON) 7.5 MG tablet Take 1 tablet (7.5 mg total) by mouth at bedtime. 30 tablet 0  . pravastatin (PRAVACHOL) 40 MG tablet Take 1 tablet (40 mg total) by mouth daily. 90 tablet 1  . predniSONE (STERAPRED UNI-PAK 48 TAB) 5 MG (48) TBPK tablet 12 day dosepack po 48 tablet 0  . STOOL SOFTENER 100 MG capsule TAKE 1 CAPSULE BY  MOUTH 2 TIMES A DAY (AM & PM) (Patient taking differently: TAKE 100 MG BY MOUTH 2 TIMES A DAY (AM & PM)) 56 capsule 0  . torsemide (DEMADEX) 100 MG tablet Take 0.5 tablets (50 mg total) by mouth 2 (two) times daily. 90 tablet 1  . traMADol (ULTRAM) 50 MG tablet Take 1 tablet (50 mg total) by mouth 2 (two) times  daily as needed. Please fax to [facility]. 180 tablet 1  . traZODone (DESYREL) 50 MG tablet Take 0.5-1 tablets (25-50 mg total) by mouth at bedtime as needed for sleep. 90 tablet 0  . vitamin B-12 (CYANOCOBALAMIN) 1000 MCG tablet Take 1,000 mcg by mouth daily.     No current facility-administered medications for this visit.     Review of Systems : See HPI for pertinent positives and negatives.  Physical Examination  Vitals:   02/21/17 1112 02/21/17 1113  BP: (!) 156/73 (!) 175/77  Pulse: 82   Resp: 18   Temp: 98.2 F (36.8 C)   TempSrc: Oral   SpO2: 96%   Weight: 158 lb (71.7 kg)   Height: 5\' 2"  (1.575 m)    Body mass index is 28.9 kg/m.  General: A&O x 3, WDWN.  Gait: slow, deliberate, using rolling walker  Pulmonary: Sym exp, respirations are non labored, good air movt, CTAB, no rales, rhonchi, or wheezing.  Cardiac: irregular rhythm, controlled rate, + murmur  Vascular: Vessel Right Left  Radial 1+Palpable 1+Palpable  Carotid with bruit without bruit  Aorta Not palpable N/A  Femoral 2+ palpable 1+ palpable  Popliteal not palpable not palpable  PT faintly Palpable faintly Palpable  DP 2+Palpable 1+Palpable   Gastrointestinal: soft, NTND, -G/R, - HSM, - palpable masses, - CVAT B.  Musculoskeletal: M/S 3/5 throughout, Extremities without ischemic changes. Severe RA and OA deformities in hands.  Skin: Thin and friable, no tears or open wounds.   Neurologic: Pain and light touch intact in extremities, Motor exam as listed above     Assessment: [name] is a 83 y.o. female who is s/p celiac artery stent placed 09/02/08 and  restented 05/31/13. 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 Since the above procedure pt states she has had no more post prandial abdominal pain other than gas bloating, did have post prandial abdominal pain prior to this. Her weight is stable. She does not seem to have post prandial abdominal pain.  Dr. Darrick Penna indicated that she is not really a candidate for open operation. Additionally she has bilateral moderate/severe asymptomatic carotid stenosis and a small AAA.   DATA  Carotid Duplex (02/21/17): Right ICA: 40-59% stenosis. Left ICA: 60-79% stenosis. >50% right ECA stenosis. Bilateral vertebral artery flow is antegrade.  Bilateral subclavian artery waveforms are normal.  No significant change compared to the exam on 08-03-16, although highest velocities in the right ICA could not be replicated.    Mesenteric Artery Duplex (Date: 02/21/2017):   Ao: 54 cm/c. Mid aorta diameter of 3.2 x 3.1 cm; previous study on 08-03-16 was 3.4 x 3.3 cm. Calcified plaque.   Celiac artery: 366 cm/s (was 231 cm/s on 08-03-16); on 06-18-15 was 375 cm/s. Velocities not as high as previous study.   SMA: NV; on 06-18-15 documented occluded  Hepatic: post stenotic turbulence  Splenic: post stenotic turbulence    Plan: Based on her exam and studies, I have offered the patient to see Dr. Darrick Penna in a couple of weeks to discuss options to address increased stenosis in the celiac artery that has been stented twice, pt is mostly asymptomatic. Carotid duplex in 6 months.     I discussed in depth with the patient the nature of atherosclerosis, and emphasized the importance of maximal medical management including strict control of blood pressure, blood glucose, and lipid levels, obtaining regular exercise, and continued cessation of smoking.  The patient is aware that without maximal  medical management the underlying atherosclerotic disease process will progress, limiting the benefit of  any interventions. The patient was given information about stroke prevention and what symptoms should prompt the patient to seek immediate medical care. Thank you for allowing [place] to participate in this patient's care.  Charisse March, RN, MSN, FNP-C [facility] Office: [phone]  Clinic Physician: Edilia Bo on call  02/21/17 11:20 AM

## 2017-02-22 ENCOUNTER — Ambulatory Visit: Payer: Medicare PPO | Admitting: Cardiology

## 2017-02-23 LAB — HM DIABETES EYE EXAM

## 2017-02-28 DIAGNOSIS — E1142 Type 2 diabetes mellitus with diabetic polyneuropathy: Secondary | ICD-10-CM | POA: Diagnosis not present

## 2017-02-28 DIAGNOSIS — E11621 Type 2 diabetes mellitus with foot ulcer: Secondary | ICD-10-CM | POA: Diagnosis not present

## 2017-02-28 DIAGNOSIS — L97521 Non-pressure chronic ulcer of other part of left foot limited to breakdown of skin: Secondary | ICD-10-CM | POA: Diagnosis not present

## 2017-03-06 ENCOUNTER — Telehealth: Payer: Self-pay | Admitting: Family Medicine

## 2017-03-06 MED ORDER — CLONAZEPAM 0.5 MG PO TABS: 0.5000 mg | ORAL_TABLET | Freq: Every day | ORAL | 1 refills | Status: DC

## 2017-03-06 NOTE — Telephone Encounter (Signed)
Pt's daughter called for refill on Klonopin be sent to mail order pharmacy. Order pended.  Daughter also questions if Pt should continue the mirtazapine Rx. Daughter reports she thinks this is causing her mom to have some memory loss. Routing to PCP for review.

## 2017-03-06 NOTE — Telephone Encounter (Signed)
Klonopin refilled.  Continue Mirtazapine for depression/anxiety and appetite.  We will discuss stopping it at the next visit.

## 2017-03-07 NOTE — Telephone Encounter (Signed)
Left information on Pt's daughter's VM. Callback provided for any questions.

## 2017-03-15 ENCOUNTER — Ambulatory Visit (INDEPENDENT_AMBULATORY_CARE_PROVIDER_SITE_OTHER): Payer: Medicare PPO | Admitting: Family Medicine

## 2017-03-15 ENCOUNTER — Encounter: Payer: Self-pay | Admitting: Family Medicine

## 2017-03-15 VITALS — BP 139/83 | HR 70 | Wt 164.0 lb

## 2017-03-15 DIAGNOSIS — N183 Chronic kidney disease, stage 3 (moderate): Secondary | ICD-10-CM | POA: Diagnosis not present

## 2017-03-15 DIAGNOSIS — R05 Cough: Secondary | ICD-10-CM | POA: Diagnosis not present

## 2017-03-15 DIAGNOSIS — M0579 Rheumatoid arthritis with rheumatoid factor of multiple sites without organ or systems involvement: Secondary | ICD-10-CM | POA: Diagnosis not present

## 2017-03-15 DIAGNOSIS — E1122 Type 2 diabetes mellitus with diabetic chronic kidney disease: Secondary | ICD-10-CM | POA: Diagnosis not present

## 2017-03-15 DIAGNOSIS — R059 Cough, unspecified: Secondary | ICD-10-CM

## 2017-03-15 LAB — POCT GLYCOSYLATED HEMOGLOBIN (HGB A1C): Hemoglobin A1C: 7.7

## 2017-03-15 NOTE — Patient Instructions (Signed)
Thank you for coming in today. Continue current medicines.  Let me know if your breathing worsens.  Recheck in 2 months.  Return sooner if needed.

## 2017-03-15 NOTE — Progress Notes (Signed)
[name] is a 83 y.o. female who presents to [name] today for follow-up of cough.  [name] has been seen several times in the past 2 months for cough and congestion.  She has been prescribed prednisone several times.  She last was prescribed prednisone for 12-day course in early November.  She now is off of prednisone and feeling well.  She denies shortness of breath chest pain or palpitations.  Diabetes: [name] has diet-controlled diabetes.  She denies hyperglycemia and hypoglycemia.  She would like to avoid further medications if possible.   Past Medical History:  Diagnosis Date  . Acute pancreatitis   . Anemia   . Arthritis   . Atrial fibrillation (HCC)   . CAD (coronary artery disease)   . Carotid artery occlusion   . CHF (congestive heart failure) (HCC)   . Diabetes mellitus age 28  . DJD (degenerative joint disease)   . DVT (deep venous thrombosis) (HCC)   . GERD (gastroesophageal reflux disease)   . GI bleed   . Hyperlipidemia   . Hypertension   . Joint pain   . Mesenteric ischemia   . Peripheral vascular disease (HCC)   . Thyroid disease    Past Surgical History:  Procedure Laterality Date  . APPENDECTOMY    . CELIAC ARTERY STENT    . CORONARY ARTERY BYPASS GRAFT    . EMBOLECTOMY  2009   right leg  . FASCIOTOMY     right leg  . HIP FRACTURE SURGERY Left   . JOINT REPLACEMENT    . TOTAL KNEE ARTHROPLASTY     bilateral  . TUBAL LIGATION    . VISCERAL ANGIOGRAM N/A 05/31/2013   Procedure: MESSENTERIC [name];  Surgeon: [name], MD;  Location: [facility] CATH LAB;  Service: Cardiovascular;  Laterality: N/A;   Social History   Tobacco Use  . Smoking status: Never Smoker  . Smokeless tobacco: Never Used  Substance Use Topics  . Alcohol use: No   family history includes Cancer in her daughter and mother; Coronary artery disease  in her sister; Diabetes in her daughter, sister, and son; Heart disease in her sister; Hyperlipidemia in her son; Hypertension in her daughter and son; Other in her father and sister.  ROS as above:  Medications: Current Outpatient Medications  Medication Sig Dispense Refill  . AMBULATORY NON FORMULARY MEDICATION Please check INR weekly for 2 months. Send reports to Dr Denyse Amass fax [phone] 1 each 0  . apixaban (ELIQUIS) 2.5 MG TABS tablet Take 1 tablet (2.5 mg total) by mouth 2 (two) times daily. Take in substitution of warfarin if affordable 180 tablet 1  . Calcium Carb-Cholecalciferol 600-800 MG-UNIT TABS TAKE ONE TABLET BY MOUTH 2 TIMES A DAY 60 tablet 6  . clonazePAM (KLONOPIN) 0.5 MG tablet Take 1 tablet (0.5 mg total) by mouth at bedtime. 90 tablet 1  . conjugated estrogens (PREMARIN) vaginal cream Place 1 Applicatorful vaginally daily. 42.5 g 12  . diltiazem (CARDIZEM CD) 180 MG 24 hr capsule Take 1 capsule (180 mg total) by mouth at bedtime. 90 capsule 3  . Ergocalciferol (VITAMIN D2) 2000 units TABS Take 2,000 Units by mouth daily.     . folic acid (FOLVITE) 1 MG tablet Take 1 tablet (1 mg total) by mouth daily. 90 tablet 3  . glucose blood test strip Once daily E11.29 100 each 12  . iron polysaccharides (FERREX 150) 150 MG capsule Take  1 capsule (150 mg total) by mouth daily. 90 capsule 3  . leflunomide (ARAVA) 10 MG tablet Take 1 tablet (10 mg total) by mouth at bedtime. 90 tablet 1  . levothyroxine (SYNTHROID, LEVOTHROID) 50 MCG tablet Take 1 tablet (50 mcg total) by mouth daily. 90 tablet 1  . metoprolol succinate (TOPROL-XL) 50 MG 24 hr tablet Take 1 tablet (50 mg total) by mouth daily. 90 tablet 3  . mirtazapine (REMERON) 7.5 MG tablet Take 1 tablet (7.5 mg total) by mouth at bedtime. 30 tablet 0  . pravastatin (PRAVACHOL) 40 MG tablet Take 1 tablet (40 mg total) by mouth daily. 90 tablet 1  . STOOL SOFTENER 100 MG capsule TAKE 1 CAPSULE BY MOUTH 2 TIMES A DAY (AM & PM)  (Patient taking differently: TAKE 100 MG BY MOUTH 2 TIMES A DAY (AM & PM)) 56 capsule 0  . torsemide (DEMADEX) 100 MG tablet Take 0.5 tablets (50 mg total) by mouth 2 (two) times daily. 90 tablet 1  . traMADol (ULTRAM) 50 MG tablet Take 1 tablet (50 mg total) by mouth 2 (two) times daily as needed. Please fax to [facility]. 180 tablet 1  . traZODone (DESYREL) 50 MG tablet Take 0.5-1 tablets (25-50 mg total) by mouth at bedtime as needed for sleep. 90 tablet 0  . vitamin B-12 (CYANOCOBALAMIN) 1000 MCG tablet Take 1,000 mcg by mouth daily.     No current facility-administered medications for this visit.    Allergies  Allergen Reactions  . Cefuroxime Anaphylaxis and Other (See Comments)    Throat swelling  . Fish Allergy Shortness Of Breath    Pt said she has throat swelling   . Ciprofloxacin Rash  . Cephalexin Rash  . Codeine Other (See Comments)    Jittery  . Zoloft [Sertraline Hcl] Rash    Health Maintenance Health Maintenance  Topic Date Due  . URINE MICROALBUMIN  01/22/1940  . OPHTHALMOLOGY EXAM  07/13/2016  . FOOT EXAM  07/26/2017  . HEMOGLOBIN A1C  09/13/2017  . TETANUS/TDAP  12/28/2025  . INFLUENZA VACCINE  Completed  . PNA vac Low Risk Adult  Completed     Exam:  BP 139/83   Pulse 70   Wt 164 lb (74.4 kg)   BMI 30.00 kg/m  Gen: Well NAD HEENT: EOMI,  MMM Lungs: Normal work of breathing. CTABL Heart: RRR no MRG Abd: NABS, Soft. Nondistended, Nontender Exts: Brisk capillary refill, warm and well perfused.    Results for orders placed or performed in visit on 03/15/17 (from the past 72 hour(s))  POCT HgB A1C     Status: None   Collection Time: 03/15/17  2:30 PM  Result Value Ref Range   Hemoglobin A1C 7.7    No results found.    Assessment and Plan: 83 y.o. female with cough: Improved.  Likely was viral bronchitis or post viral cough.  Plan for watchful waiting and follow-up in about 2 months.  Diabetes: Improving A1c since last visit.  I suspect it will  continue to improve with steroids.  Recheck in 2 months.   Orders Placed This Encounter  Procedures  . POCT HgB A1C   No orders of the defined types were placed in this encounter.    Discussed warning signs or symptoms. Please see discharge instructions. Patient expresses understanding.

## 2017-03-16 ENCOUNTER — Encounter: Payer: Self-pay | Admitting: Vascular Surgery

## 2017-03-16 ENCOUNTER — Ambulatory Visit (INDEPENDENT_AMBULATORY_CARE_PROVIDER_SITE_OTHER): Payer: Medicare PPO | Admitting: Vascular Surgery

## 2017-03-16 VITALS — BP 159/91 | HR 79 | Resp 18 | Ht 62.0 in | Wt 162.0 lb

## 2017-03-16 DIAGNOSIS — K551 Chronic vascular disorders of intestine: Secondary | ICD-10-CM

## 2017-03-16 NOTE — Progress Notes (Signed)
[name] is a 83 y.o. female who is s/p celiac artery stent placed 09/02/08 and restented 05/31/13. 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  She is here today for further evaluation after recent mesenteric duplex exam showed increased velocity across the celiac artery stent of 366 cm/s. Of note she was not NPO for the study.  Since the above procedure pt states she has had no more post prandial abdominal pain, did have post prandial abdominal pain prior to this.  She states her weight has either increased or stayed the same.  She is not really a candidate for open operation.    She is currently living at [facility]. She is on Eliquis for atrial fibrillation. Other chronic medical problems include diabetes hypertension congestive heart failure degenerative joint disease.    Pt denies any history of stroke or TIA.  We have followed her for bilateral moderate carotid stenosis. She takes a statin. Pt denies any history of MI, but has a history of CABG x3.   Current Outpatient Medications on File Prior to Visit  Medication Sig Dispense Refill  . AMBULATORY NON FORMULARY MEDICATION Please check INR weekly for 2 months. Send reports to Dr Denyse Amass fax [phone] 1 each 0  . apixaban (ELIQUIS) 2.5 MG TABS tablet Take 1 tablet (2.5 mg total) by mouth 2 (two) times daily. Take in substitution of warfarin if affordable 180 tablet 1  . Calcium Carb-Cholecalciferol 600-800 MG-UNIT TABS TAKE ONE TABLET BY MOUTH 2 TIMES A DAY 60 tablet 6  . clonazePAM (KLONOPIN) 0.5 MG tablet Take 1 tablet (0.5 mg total) by mouth at bedtime. 90 tablet 1  . conjugated estrogens (PREMARIN) vaginal cream Place 1 Applicatorful vaginally daily. 42.5 g 12  . diltiazem (CARDIZEM CD) 180 MG 24 hr capsule Take 1 capsule (180 mg total) by mouth at bedtime. 90 capsule 3  . Ergocalciferol (VITAMIN D2) 2000 units TABS Take 2,000 Units by mouth daily.     . folic acid (FOLVITE) 1 MG tablet Take  1 tablet (1 mg total) by mouth daily. 90 tablet 3  . glucose blood test strip Once daily E11.29 100 each 12  . iron polysaccharides (FERREX 150) 150 MG capsule Take 1 capsule (150 mg total) by mouth daily. 90 capsule 3  . leflunomide (ARAVA) 10 MG tablet Take 1 tablet (10 mg total) by mouth at bedtime. 90 tablet 1  . levothyroxine (SYNTHROID, LEVOTHROID) 50 MCG tablet Take 1 tablet (50 mcg total) by mouth daily. 90 tablet 1  . metoprolol succinate (TOPROL-XL) 50 MG 24 hr tablet Take 1 tablet (50 mg total) by mouth daily. 90 tablet 3  . mirtazapine (REMERON) 7.5 MG tablet Take 1 tablet (7.5 mg total) by mouth at bedtime. 30 tablet 0  . pravastatin (PRAVACHOL) 40 MG tablet Take 1 tablet (40 mg total) by mouth daily. 90 tablet 1  . STOOL SOFTENER 100 MG capsule TAKE 1 CAPSULE BY MOUTH 2 TIMES A DAY (AM & PM) (Patient taking differently: TAKE 100 MG BY MOUTH 2 TIMES A DAY (AM & PM)) 56 capsule 0  . torsemide (DEMADEX) 100 MG tablet Take 0.5 tablets (50 mg total) by mouth 2 (two) times daily. 90 tablet 1  . traMADol (ULTRAM) 50 MG tablet Take 1 tablet (50 mg total) by mouth 2 (two) times daily as needed. Please fax to [facility]. 180 tablet 1  . traZODone (DESYREL) 50 MG tablet Take 0.5-1 tablets (25-50 mg total) by mouth at bedtime  as needed for sleep. 90 tablet 0  . vitamin B-12 (CYANOCOBALAMIN) 1000 MCG tablet Take 1,000 mcg by mouth daily.     No current facility-administered medications on file prior to visit.    Review of systems: She denies shortness of breath. She denies chest pain  Physical exam:  Vitals:   03/16/17 1105 03/16/17 1106  BP: (!) 157/80 (!) 159/91  Pulse: 79   Resp: 18   SpO2: 97%   Weight: 162 lb (73.5 kg)   Height: 5\' 2"  (1.575 m)     Neck: No carotid bruits  Abdomen: Soft nontender nondistended no abdominal bruits  Extremities: 2+ femoral pulses bilaterally  Data: I reviewed the patient's mesenteric duplex from 02/21/2017 which again showed increased velocity  across or celiac artery stent with chronic occlusion of the superior mesenteric artery.  Assessment: Increased velocities celiac artery stent currently asymptomatic. Patient was not NPO at prior study  Plan: Repeat mesenteric duplex exam and see me in 3 months. The patient will return sooner she develops symptoms or weight loss.  02/23/2017, MD [facility] Office: [phone] Pager: [phone]

## 2017-03-23 DIAGNOSIS — Z79899 Other long term (current) drug therapy: Secondary | ICD-10-CM | POA: Diagnosis not present

## 2017-03-23 DIAGNOSIS — Z6830 Body mass index (BMI) 30.0-30.9, adult: Secondary | ICD-10-CM | POA: Diagnosis not present

## 2017-03-23 DIAGNOSIS — E669 Obesity, unspecified: Secondary | ICD-10-CM | POA: Diagnosis not present

## 2017-03-23 DIAGNOSIS — M1009 Idiopathic gout, multiple sites: Secondary | ICD-10-CM | POA: Diagnosis not present

## 2017-03-23 DIAGNOSIS — M2550 Pain in unspecified joint: Secondary | ICD-10-CM | POA: Diagnosis not present

## 2017-03-23 DIAGNOSIS — M0579 Rheumatoid arthritis with rheumatoid factor of multiple sites without organ or systems involvement: Secondary | ICD-10-CM | POA: Diagnosis not present

## 2017-03-23 NOTE — Addendum Note (Signed)
Addended by: Bennye Nix A on: 03/23/2017 04:14 PM   Modules accepted: Orders  

## 2017-03-24 ENCOUNTER — Encounter: Payer: Self-pay | Admitting: Family Medicine

## 2017-03-26 IMAGING — US US RENAL
1 series · 13 of 25 positions shown · non-contrast
Comparison: CT [DATE]

CLINICAL DATA: Chronic kidney disease stage 4. History of a
abdominal aortic aneurysm, celiac stent, SMA occlusion.

EXAM:
RENAL/URINARY TRACT ULTRASOUND
RENAL DUPLEX DOPPLER ULTRASOUND

[Series 1: us renal · 0.25mm/px · 13 of 56 slices shown]
[im 1/56]
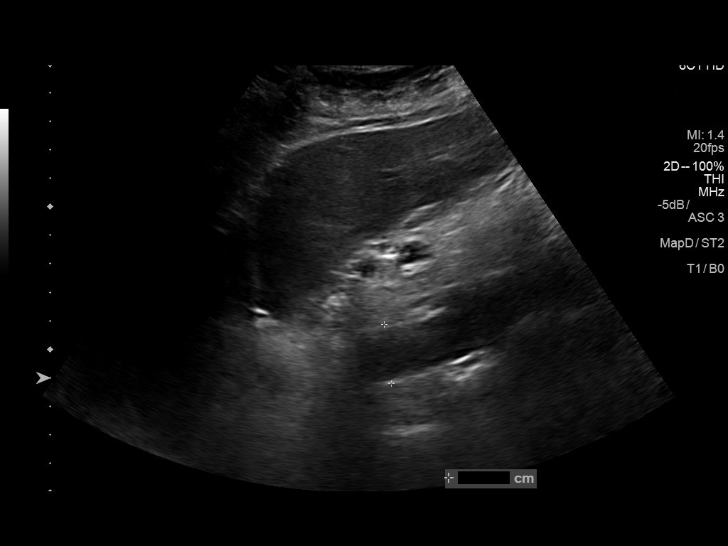
[im 5/56]
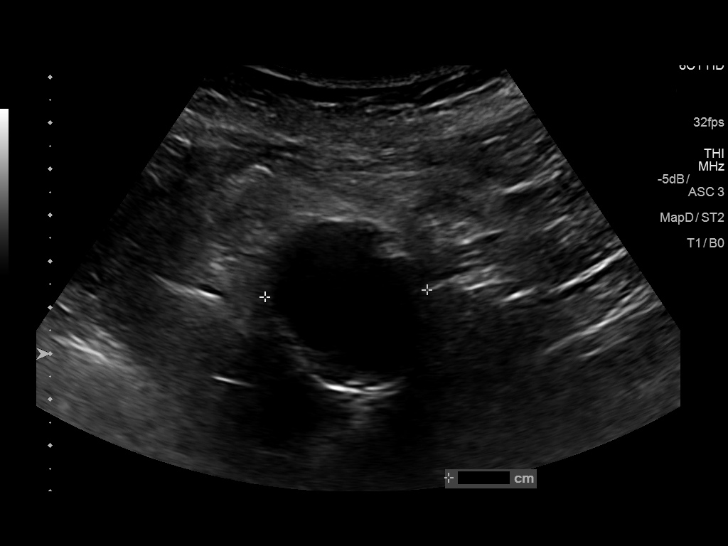
[im 10/56]
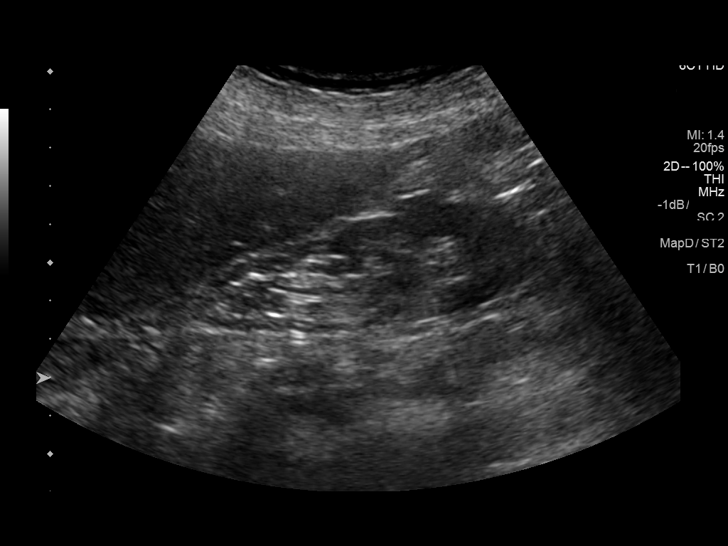
[im 14/56]
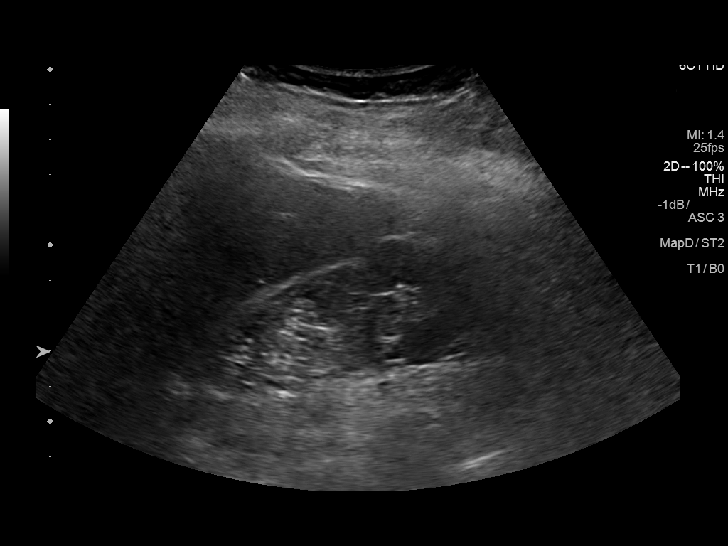
[im 19/56]
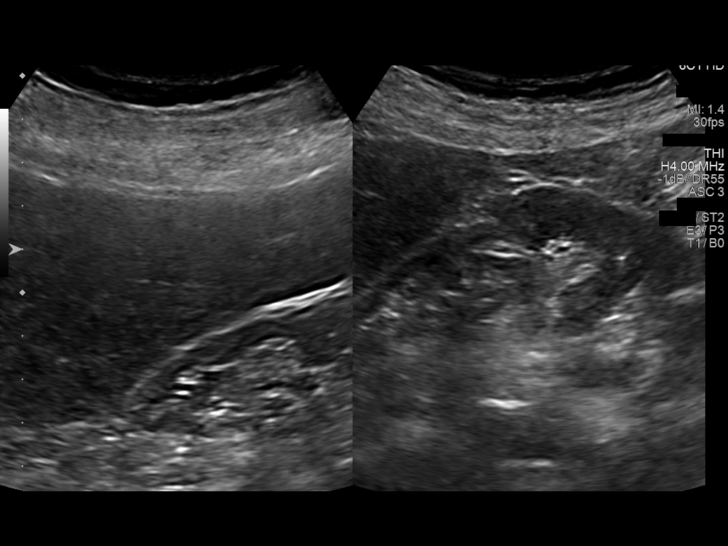
[im 23/56]
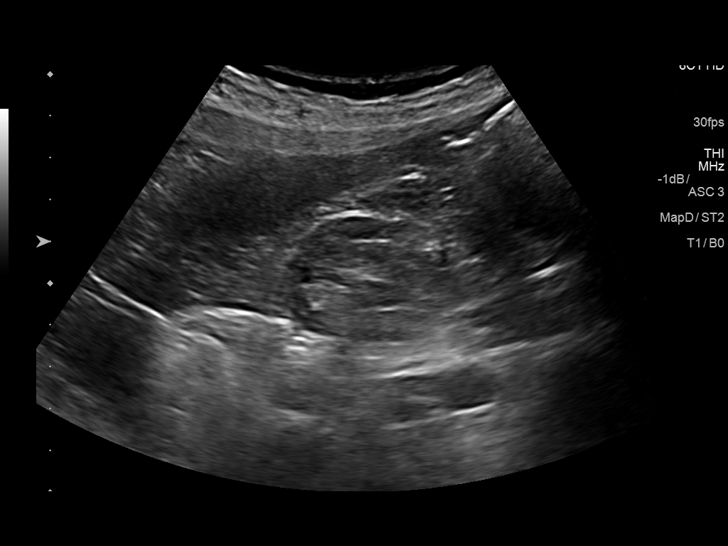
[im 28/56]
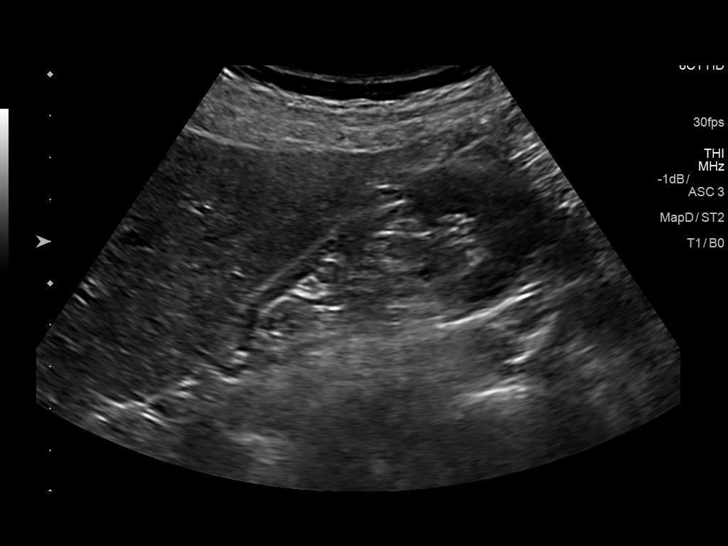
[im 33/56]
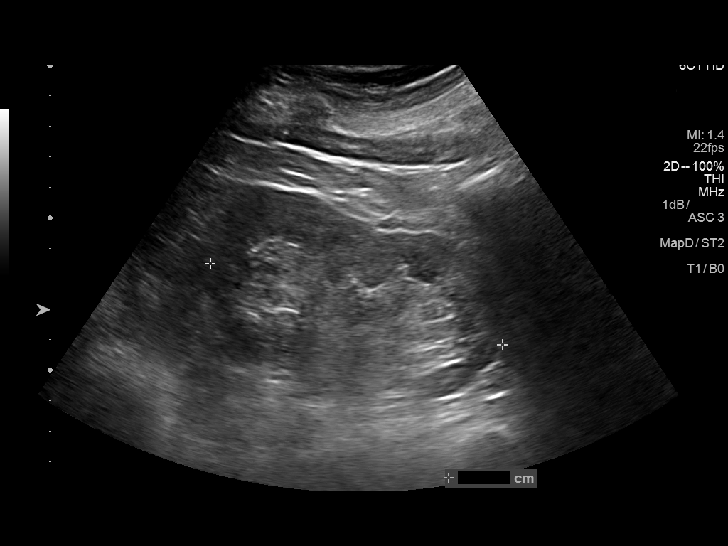
[im 37/56]
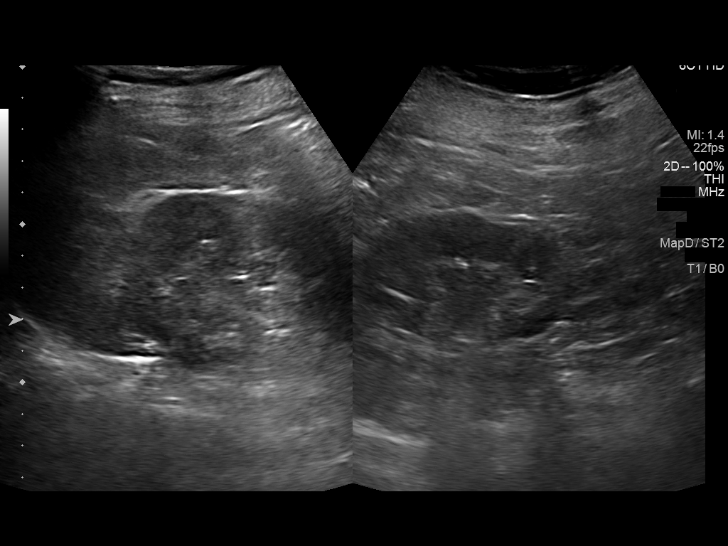
[im 42/56]
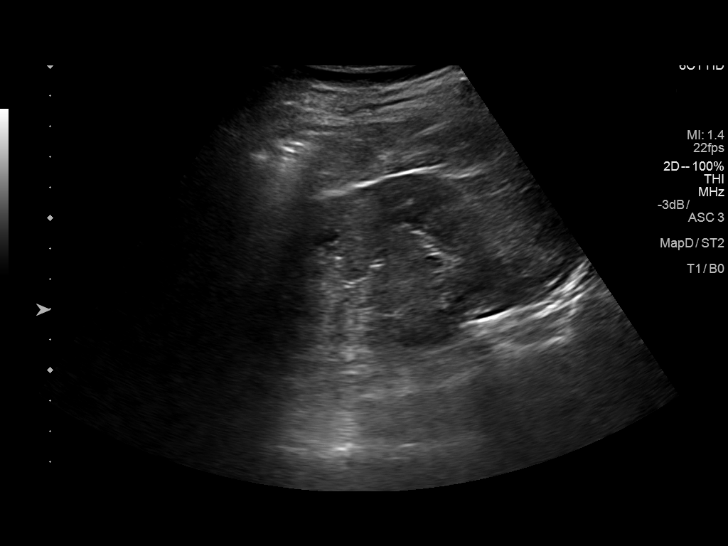
[im 46/56]
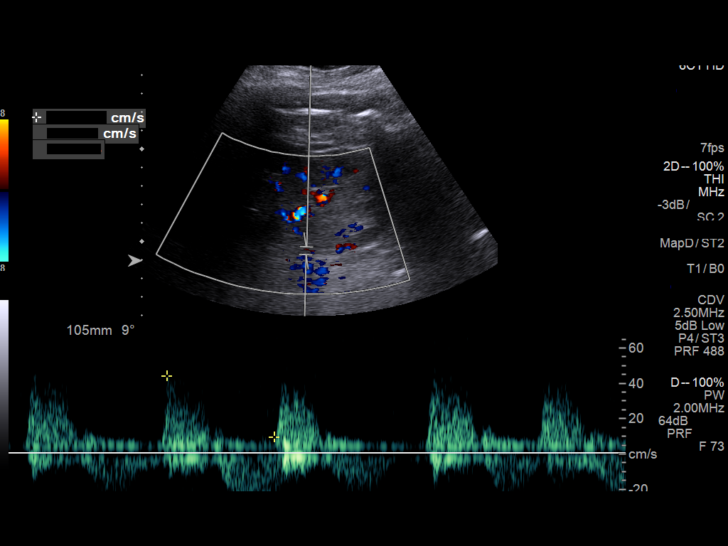
[im 51/56]
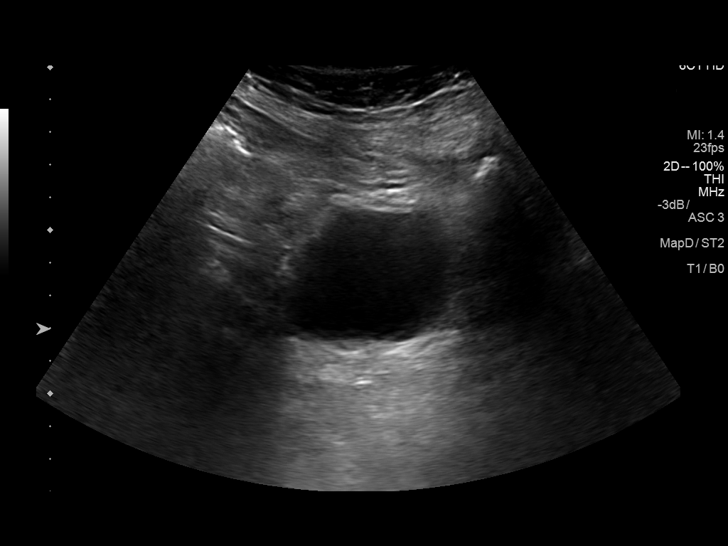
[im 56/56]
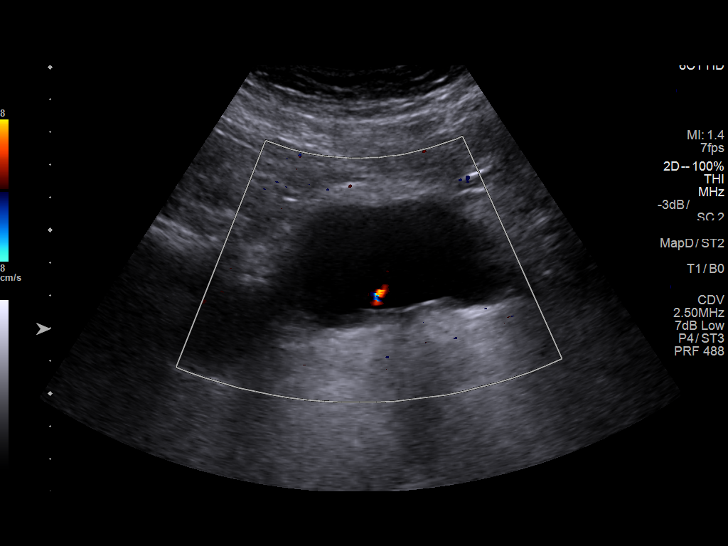

[13 of 25 positions shown; findings below may reference images not displayed]

FINDINGS: Right Kidney:

Length: 8.9 cm . Cortical thinning. No focal lesion or
hydronephrosis. Renal vein patent at hilum.

Left Kidney:

Length: 10 cm. No focal lesion or hydronephrosis. Renal vein patent
at the hilum.

Bladder: Incompletely distended. Bilateral ureteral jets documented.

RENAL DUPLEX ULTRASOUND

Right Renal Artery Velocities:

Origin:  Not visualized

Mid:  Not visualized

Hilum:  Not visualize

Interlobar:  33 cm/sec

Arcuate:  20 Cm/sec

Left Renal Artery Velocities:

Origin:  Not visualized

Mid:  Not visualized

Hilum:  44 cm/sec

Interlobar:  38 cm/sec

Arcuate:  22 cm/sec

Aortic Velocity:  56 Cm/sec

Right Renal-Aortic Ratios:

Origin: NA

Mid:  NA

Hilum: NA

Interlobar:

Arcuate:

Left Renal-Aortic Ratios:

Origin: NA

Mid: NA

Hilum:

Interlobar:

Arcuate:

Note made of fusiform infrarenal abdominal aortic aneurysm measuring
at least 5.3 cm in length, at least 3.5 cm transverse diameter.
IMPRESSION: 1. Incomplete visualization of renal arteries, limiting assessment
for renal artery stenosis. If there is continued clinical concern,
renal MRA (lower radiation risk, can be performed noncontrast in the
setting of renal dysfunction) and CTA ( higher spatial resolution)
represent more accurate studies, which are additionally more
sensitive to the detection of duplicated renal arteries.
2. Negative for hydronephrosis.
3. 3.5 cm abdominal aortic aneurysm. Recommend followup by
ultrasound in 2 years. This recommendation follows ACR consensus
guidelines: White Paper of the ACR Incidental Findings Committee II

## 2017-03-27 ENCOUNTER — Telehealth: Payer: Self-pay | Admitting: Family Medicine

## 2017-03-27 MED ORDER — TRAMADOL HCL 50 MG PO TABS: 50.0000 mg | ORAL_TABLET | Freq: Two times a day (BID) | ORAL | 1 refills | Status: DC | PRN

## 2017-03-27 NOTE — Telephone Encounter (Signed)
Refilled

## 2017-03-27 NOTE — Telephone Encounter (Signed)
Pt daughter calls and states her mothers Tramadol 50mg  is getting low and needs a refill sent to [facility] and to please let daughter know that it is done.

## 2017-03-28 NOTE — Telephone Encounter (Signed)
Spoke to patient daughter advised her that medication Tramadol has been sent to [facility]. Mercie Balsley,CMA

## 2017-03-30 ENCOUNTER — Other Ambulatory Visit: Payer: Self-pay | Admitting: Family Medicine

## 2017-03-30 DIAGNOSIS — E11621 Type 2 diabetes mellitus with foot ulcer: Secondary | ICD-10-CM | POA: Diagnosis not present

## 2017-03-30 DIAGNOSIS — L97521 Non-pressure chronic ulcer of other part of left foot limited to breakdown of skin: Secondary | ICD-10-CM | POA: Diagnosis not present

## 2017-04-05 NOTE — Telephone Encounter (Signed)
Pt's daughter called again to question if the Mirtazapine could be causing memory loss, or for the Pt's memory to get worse. She also has noticed an increase in incontinence. Questioning of a neurology referral for further memory testing would be beneficial. Will route.

## 2017-04-06 ENCOUNTER — Ambulatory Visit (INDEPENDENT_AMBULATORY_CARE_PROVIDER_SITE_OTHER): Payer: Medicare PPO | Admitting: Physician Assistant

## 2017-04-06 ENCOUNTER — Encounter: Payer: Self-pay | Admitting: Physician Assistant

## 2017-04-06 VITALS — BP 143/80 | HR 96 | Temp 98.5°F | Resp 16 | Ht 62.0 in | Wt 164.0 lb

## 2017-04-06 DIAGNOSIS — R3129 Other microscopic hematuria: Secondary | ICD-10-CM | POA: Diagnosis not present

## 2017-04-06 DIAGNOSIS — R809 Proteinuria, unspecified: Secondary | ICD-10-CM

## 2017-04-06 DIAGNOSIS — R350 Frequency of micturition: Secondary | ICD-10-CM | POA: Diagnosis not present

## 2017-04-06 DIAGNOSIS — R351 Nocturia: Secondary | ICD-10-CM

## 2017-04-06 LAB — POCT URINALYSIS DIPSTICK
BILIRUBIN UA: NEGATIVE
Glucose, UA: NEGATIVE
KETONES UA: NEGATIVE
NITRITE UA: NEGATIVE
PH UA: 6 (ref 5.0–8.0)
SPEC GRAV UA: 1.02 (ref 1.010–1.025)
UROBILINOGEN UA: 0.2 U/dL

## 2017-04-06 MED ORDER — SULFAMETHOXAZOLE-TRIMETHOPRIM 800-160 MG PO TABS: 1.0000 | ORAL_TABLET | Freq: Two times a day (BID) | ORAL | 0 refills | Status: AC

## 2017-04-06 NOTE — Telephone Encounter (Signed)
Left message on patient daughter vm to call me back regarding this. Chang Tiggs,CMA

## 2017-04-06 NOTE — Progress Notes (Signed)
HPI:                                                                [name] is a 83 y.o. female who presents to [name]: Primary Care Sports Medicine today for urinary frequency  Patient reports approximately 3 months of increased urinary frequency and nocturia 1-2 times per night. She denies dysuria, urgency, flank pain, abdominal pain, or constitutional symptoms. She had UTI caused by Klebsiella resistant to Ampicillin 3 months ago followed by 2 negative urine cultures. She is on Torsemide bid for CHF. She is also on premarin cream for atrophic vaginitis.   Past Medical History:  Diagnosis Date  . Acute pancreatitis   . Anemia   . Arthritis   . Atrial fibrillation (HCC)   . CAD (coronary artery disease)   . Carotid artery occlusion   . CHF (congestive heart failure) (HCC)   . Diabetes mellitus age 15  . DJD (degenerative joint disease)   . DVT (deep venous thrombosis) (HCC)   . GERD (gastroesophageal reflux disease)   . GI bleed   . Hyperlipidemia   . Hypertension   . Joint pain   . Mesenteric ischemia   . Peripheral vascular disease (HCC)   . Thyroid disease    Past Surgical History:  Procedure Laterality Date  . APPENDECTOMY    . CELIAC ARTERY STENT    . CORONARY ARTERY BYPASS GRAFT    . EMBOLECTOMY  2009   right leg  . FASCIOTOMY     right leg  . HIP FRACTURE SURGERY Left   . JOINT REPLACEMENT    . TOTAL KNEE ARTHROPLASTY     bilateral  . TUBAL LIGATION    . VISCERAL ANGIOGRAM N/A 05/31/2013   Procedure: MESSENTERIC [name];  Surgeon: [name], MD;  Location: [facility] CATH LAB;  Service: Cardiovascular;  Laterality: N/A;   Social History   Tobacco Use  . Smoking status: Never Smoker  . Smokeless tobacco: Never Used  Substance Use Topics  . Alcohol use: No   family history includes Cancer in her daughter and mother; Coronary artery disease in her sister; Diabetes in her daughter, sister, and son; Heart disease in her sister;  Hyperlipidemia in her son; Hypertension in her daughter and son; Other in her father and sister.  ROS: negative except as noted in the HPI  Medications: Current Outpatient Medications  Medication Sig Dispense Refill  . AMBULATORY NON FORMULARY MEDICATION Please check INR weekly for 2 months. Send reports to Dr Denyse Amass fax [phone] 1 each 0  . apixaban (ELIQUIS) 2.5 MG TABS tablet Take 1 tablet (2.5 mg total) by mouth 2 (two) times daily. Take in substitution of warfarin if affordable 180 tablet 1  . Calcium Carb-Cholecalciferol 600-800 MG-UNIT TABS TAKE ONE TABLET BY MOUTH 2 TIMES A DAY 60 tablet 6  . clonazePAM (KLONOPIN) 0.5 MG tablet TAKE 1 TABLET BY MOUTH AT BEDTIME 30 tablet 5  . conjugated estrogens (PREMARIN) vaginal cream Place 1 Applicatorful vaginally daily. 42.5 g 12  . diltiazem (CARDIZEM CD) 180 MG 24 hr capsule Take 1 capsule (180 mg total) by mouth at bedtime. 90 capsule 3  . Ergocalciferol (VITAMIN D2) 2000 units TABS Take 2,000 Units by mouth daily.     Marland Kitchen  folic acid (FOLVITE) 1 MG tablet Take 1 tablet (1 mg total) by mouth daily. 90 tablet 3  . glucose blood test strip Once daily E11.29 100 each 12  . iron polysaccharides (FERREX 150) 150 MG capsule Take 1 capsule (150 mg total) by mouth daily. 90 capsule 3  . leflunomide (ARAVA) 10 MG tablet Take 1 tablet (10 mg total) by mouth at bedtime. 90 tablet 1  . levothyroxine (SYNTHROID, LEVOTHROID) 50 MCG tablet Take 1 tablet (50 mcg total) by mouth daily. 90 tablet 1  . metoprolol succinate (TOPROL-XL) 50 MG 24 hr tablet Take 1 tablet (50 mg total) by mouth daily. 90 tablet 3  . mirtazapine (REMERON) 7.5 MG tablet Take 1 tablet (7.5 mg total) by mouth at bedtime. 30 tablet 0  . pravastatin (PRAVACHOL) 40 MG tablet Take 1 tablet (40 mg total) by mouth daily. 90 tablet 1  . STOOL SOFTENER 100 MG capsule TAKE 1 CAPSULE BY MOUTH 2 TIMES A DAY (AM & PM) (Patient taking differently: TAKE 100 MG BY MOUTH 2 TIMES A DAY (AM & PM)) 56  capsule 0  . sulfamethoxazole-trimethoprim (BACTRIM DS) 800-160 MG tablet Take 1 tablet by mouth 2 (two) times daily for 3 days. 6 tablet 0  . torsemide (DEMADEX) 100 MG tablet Take 0.5 tablets (50 mg total) by mouth 2 (two) times daily. 90 tablet 1  . traMADol (ULTRAM) 50 MG tablet Take 1 tablet (50 mg total) by mouth 2 (two) times daily as needed. Please fax to [facility]. 180 tablet 1  . traZODone (DESYREL) 50 MG tablet Take 0.5-1 tablets (25-50 mg total) by mouth at bedtime as needed for sleep. 90 tablet 0  . vitamin B-12 (CYANOCOBALAMIN) 1000 MCG tablet Take 1,000 mcg by mouth daily.     No current facility-administered medications for this visit.    Allergies  Allergen Reactions  . Cefuroxime Anaphylaxis and Other (See Comments)    Throat swelling  . Fish Allergy Shortness Of Breath    Pt said she has throat swelling   . Ciprofloxacin Rash  . Cephalexin Rash  . Codeine Other (See Comments)    Jittery  . Zoloft [Sertraline Hcl] Rash       Objective:  BP (!) 143/80   Pulse 96   Temp 98.5 F (36.9 C)   Resp 16   Ht 5\' 2"  (1.575 m)   Wt 164 lb (74.4 kg)   SpO2 95%   BMI 30.00 kg/m  Gen:  alert, not ill-appearing, no distress, appropriate for age, obese elderly female HEENT: head normocephalic without obvious abnormality, conjunctiva and cornea clear, trachea midline Pulm: Normal work of breathing, normal phonation GI: abdomen obese, soft, nontender, no CVA tenderness MSK: extremities atraumatic, ambulating with walker Skin: intact, no rashes on exposed skin, no jaundice, no cyanosis    Results for orders placed or performed in visit on 04/06/17 (from the past 72 hour(s))  POCT urinalysis dipstick     Status: Abnormal   Collection Time: 04/06/17 10:43 AM  Result Value Ref Range   Color, UA dark yellow    Clarity, UA cloudy    Glucose, UA neg    Bilirubin, UA neg    Ketones, UA neg    Spec Grav, UA 1.020 1.010 - 1.025   Blood, UA trace    pH, UA 6.0 5.0 - 8.0    Protein, UA >=300mg /dl    Urobilinogen, UA 0.2 0.2 or 1.0 E.U./dL   Nitrite, UA neg    Leukocytes, UA  Large (3+) (A) Negative   Appearance dark yellow    Odor strong    No results found.    Assessment and Plan: 83 y.o. female with   1. Urinary frequency - POCT urinalysis dipstick positive for trace blood and large leuks as well as protein. She is on diuretic therapy, which complicates the clinical picture. Known atrophic vaginitis increases risk of UTI. Will start empiric treatment for uncomplicated cystitis. She is allergic to Cipro and Cephalosporins. Bactrim x 3 days. She has CKD stage 3. Most recent renal function shows GFR 40, no dosage adjustment necessary Lab Results  Component Value Date   CREATININE 1.23 (H) 01/11/2017   BUN 23 01/11/2017   NA 142 01/11/2017   K 3.8 01/11/2017   CL 103 01/11/2017   CO2 29 01/11/2017   - Urine culture pending - sulfamethoxazole-trimethoprim (BACTRIM DS) 800-160 MG tablet; Take 1 tablet by mouth 2 (two) times daily for 3 days.  Dispense: 6 tablet; Refill: 0   2. Nocturia   3. Microscopic hematuria  4. Proteinuria - in the setting of known CKD   Patient education and anticipatory guidance given Patient agrees with treatment plan Follow-up as needed if symptoms worsen or fail to improve  Levonne Hubert PA-C

## 2017-04-06 NOTE — Telephone Encounter (Signed)
The worsening memory could be due to a variety of issues.  Mirtazapine could be causing them but not super likely.  We are using this medicine to help with appetite as well as depression symptoms.  Do you want me to refer to neurology? My plan is to address the medicine at the next visit.

## 2017-04-06 NOTE — Patient Instructions (Addendum)
-   take antibiotic twice a day for 3 days - take with food to avoid chance of GI upset   Urinary Tract Infection, Adult A urinary tract infection (UTI) is an infection of any part of the urinary tract. The urinary tract includes the:  Kidneys.  Ureters.  Bladder.  Urethra.  These organs make, store, and get rid of pee (urine) in the body. Follow these instructions at home:  Take over-the-counter and prescription medicines only as told by your doctor.  If you were prescribed an antibiotic medicine, take it as told by your doctor. Do not stop taking the antibiotic even if you start to feel better.  Avoid the following drinks: ? Alcohol. ? Caffeine. ? Tea. ? Carbonated drinks.  Drink enough fluid to keep your pee clear or pale yellow.  Keep all follow-up visits as told by your doctor. This is important.  Make sure to: ? Empty your bladder often and completely. Do not to hold pee for long periods of time. ? Empty your bladder before and after sex. ? Wipe from front to back after a bowel movement if you are female. Use each tissue one time when you wipe. Contact a doctor if:  You have back pain.  You have a fever.  You feel sick to your stomach (nauseous).  You throw up (vomit).  Your symptoms do not get better after 3 days.  Your symptoms go away and then come back. Get help right away if:  You have very bad back pain.  You have very bad lower belly (abdominal) pain.  You are throwing up and cannot keep down any medicines or water. This information is not intended to replace advice given to you by your health care provider. Make sure you discuss any questions you have with your health care provider. Document Released: 09/14/2007 Document Revised: 09/03/2015 Document Reviewed: 02/16/2015 Elsevier Interactive Patient Education  [employer].

## 2017-04-06 NOTE — Telephone Encounter (Signed)
Pt is being seen in office today for possible UTI with [facility].

## 2017-04-07 ENCOUNTER — Telehealth: Payer: Self-pay

## 2017-04-07 ENCOUNTER — Other Ambulatory Visit: Payer: Self-pay | Admitting: Family Medicine

## 2017-04-07 NOTE — Telephone Encounter (Signed)
That may be a good idea.

## 2017-04-07 NOTE — Telephone Encounter (Signed)
Patient daughter called and stated that a previous doctor put her mother on a long term low does antibiotic for recurrent UTIs in the past. She wants to know if that is something that you would consider. Please advise. Rhonda Cunningham,CMA

## 2017-04-08 LAB — URINE CULTURE
MICRO NUMBER:: 81452672
SPECIMEN QUALITY: ADEQUATE

## 2017-04-09 NOTE — Progress Notes (Signed)
Your urine culture was inconclusive and possibly contaminated with vaginal bacteria If you are still having symptoms, it is okay to finish the antibiotic If you are still symptomatic after the antibiotic, recommend you return for a follow-up pelvic exam with your PCP  

## 2017-04-10 NOTE — Telephone Encounter (Signed)
Spoke to patient daughter, she would like to know if you would manage antibiotic for her mom or will she have to go to a urologist for that. Please advise. Rhonda Cunningham,CMA

## 2017-04-12 NOTE — Telephone Encounter (Signed)
I can manage it. However there are some real risks to this plan. Mostly [name] is allergic to several different antibiotics and she does not have good kidney function for others. I am concerned that because we have so few antibiotic choices if she were to get a UTI on suppressive antibiotics we would be limited.  I plan to discuss this with [name] at her next visit with me. I also need a good urine culture before we start treatment.    We will finalize this plan at the next visit.

## 2017-04-12 NOTE — Telephone Encounter (Signed)
Left a message on patient daughter Marga Melnick to call me back regarding this message. Will try back later if no call back. Katey Barrie,CMA

## 2017-04-12 NOTE — Telephone Encounter (Signed)
Spoke to patient daughter gave her advise as noted below. Heinz Eckert,CMA

## 2017-04-12 NOTE — Telephone Encounter (Signed)
Patient daughter stated that patient saw a Urologist in [place] Dr. Kirkland Hun 3 years ago and he placed the patient on a low dose antibiotic that was effective. But she did not know the name of it. Patient has a follow up in February to discuss. Rhonda Cunningham,CMA

## 2017-04-14 NOTE — Telephone Encounter (Signed)
Dr. Denyse Amass please see noted below. Jhovanny Guinta,CMA

## 2017-04-24 ENCOUNTER — Telehealth: Payer: Self-pay

## 2017-04-24 MED ORDER — TRAMADOL HCL 50 MG PO TABS: 50.0000 mg | ORAL_TABLET | Freq: Two times a day (BID) | ORAL | 1 refills | Status: DC | PRN

## 2017-04-24 NOTE — Telephone Encounter (Signed)
Patient daughter called and stated the Mirtazapine is affecting her mother memory she wants to know if patient can stop taking the medication cold [place]  or should she taper down on it. And she wants to know if you recommend anything else for sun down anxiety. Also she is requesting a refill for Tramadol sent to [facility] pharmacy. She stated that her mom is taking it twice a day. Please advise. Shiva Sahagian,CMA

## 2017-04-24 NOTE — Telephone Encounter (Signed)
Take 1/2 pill of mirtazapine at night for 3 days then stop.  Tramadol refilled.

## 2017-04-25 NOTE — Telephone Encounter (Signed)
Spoke to daughter gave her advise as noted below. . She still wants to know what else would you recommend for the sun down anxiety.. Please advise. Vercie Pokorny,CMA

## 2017-04-26 NOTE — Telephone Encounter (Signed)
Left detailed message on patient daughter vm with advise as noted below. Advised patient to call back if she would like a phone call from the doctor on Friday around noon. Jayten Gabbard,CMA

## 2017-04-26 NOTE — Progress Notes (Signed)
HPI: FU atrial fibrillation chronic diastolic CHF and CAD. Patient did have coronary artery bypass graft in 2009 with a LIMA to the LAD, saphenous vein graft to OM1 and OM3 and saphenous vein graft to the marginal. Echocardiogram August 2018 showed normal LV systolic function, severe left ventricular hypertrophy, mild aortic stenosis, moderate mitral regurgitation, biatrial enlargement, mild right ventricular enlargement and mild tricuspid regurgitation. Patient previously hospitalized and noted to be in recurrent atrial fibrillation. Plan was rate control and anticoagulation. It was noted that she was bradycardic with combination of Cardizem 360 mg daily and metoprolol 25 mg daily. Holter monitor August 2018 showed atrial fibrillation with PVCs or aberrantly conducted beats and rate upper normal. Toprol increased to 50 mg daily. Abdominal ultrasound October 2018 showed 3.5 cm abdominal aortic aneurysm. Carotid Dopplers November 2018 showed 60-79% left and 40-59% right stenosis. Patient is followed by vascular surgery for history of celiac artery stent and chronic occlusion of superior mesenteric artery. Since last seen,  patient denies dyspnea, chest pain, palpitations or syncope.  She does have occasional pedal edema.  She is only taking her Demadex 50 mg once daily and occasionally a second dose but not every day.  Current Outpatient Medications  Medication Sig Dispense Refill  . AMBULATORY NON FORMULARY MEDICATION Please check INR weekly for 2 months. Send reports to Dr Denyse Amass fax [phone] 1 each 0  . apixaban (ELIQUIS) 2.5 MG TABS tablet Take 1 tablet (2.5 mg total) by mouth 2 (two) times daily. Take in substitution of warfarin if affordable 180 tablet 1  . Calcium Carb-Cholecalciferol 600-800 MG-UNIT TABS TAKE ONE TABLET BY MOUTH 2 TIMES A DAY 60 tablet 6  . clonazePAM (KLONOPIN) 0.5 MG tablet TAKE 1 TABLET BY MOUTH AT BEDTIME 30 tablet 5  . conjugated estrogens (PREMARIN) vaginal cream  Place 1 Applicatorful vaginally daily. 42.5 g 12  . diltiazem (CARDIZEM CD) 180 MG 24 hr capsule Take 1 capsule (180 mg total) by mouth at bedtime. 90 capsule 3  . Ergocalciferol (VITAMIN D2) 2000 units TABS Take 2,000 Units by mouth daily.     . folic acid (FOLVITE) 1 MG tablet Take 1 tablet (1 mg total) by mouth daily. 90 tablet 3  . glucose blood test strip Once daily E11.29 100 each 12  . iron polysaccharides (FERREX 150) 150 MG capsule Take 1 capsule (150 mg total) by mouth daily. 90 capsule 3  . leflunomide (ARAVA) 10 MG tablet Take 1 tablet (10 mg total) by mouth at bedtime. 90 tablet 1  . levothyroxine (SYNTHROID, LEVOTHROID) 50 MCG tablet Take 1 tablet (50 mcg total) by mouth daily. 90 tablet 1  . metoprolol succinate (TOPROL-XL) 50 MG 24 hr tablet Take 1 tablet (50 mg total) by mouth daily. 90 tablet 3  . mirtazapine (REMERON) 7.5 MG tablet Take 1 tablet (7.5 mg total) by mouth at bedtime. 30 tablet 0  . pravastatin (PRAVACHOL) 40 MG tablet Take 1 tablet (40 mg total) by mouth daily. 90 tablet 1  . STOOL SOFTENER 100 MG capsule TAKE 1 CAPSULE BY MOUTH 2 TIMES A DAY (AM & PM) (Patient taking differently: TAKE 100 MG BY MOUTH 2 TIMES A DAY (AM & PM)) 56 capsule 0  . torsemide (DEMADEX) 100 MG tablet Take 0.5 tablets (50 mg total) by mouth 2 (two) times daily. 90 tablet 1  . traMADol (ULTRAM) 50 MG tablet Take 1 tablet (50 mg total) by mouth 2 (two) times daily as needed. Please fax to [facility].  180 tablet 1  . traZODone (DESYREL) 50 MG tablet Take 0.5-1 tablets (25-50 mg total) by mouth at bedtime as needed for sleep. 90 tablet 0  . vitamin B-12 (CYANOCOBALAMIN) 1000 MCG tablet Take 1,000 mcg by mouth daily.     No current facility-administered medications for this visit.      Past Medical History:  Diagnosis Date  . Acute pancreatitis   . Anemia   . Arthritis   . Atrial fibrillation (HCC)   . CAD (coronary artery disease)   . Carotid artery occlusion   . CHF (congestive heart  failure) (HCC)   . Diabetes mellitus age 66  . DJD (degenerative joint disease)   . DVT (deep venous thrombosis) (HCC)   . GERD (gastroesophageal reflux disease)   . GI bleed   . Hyperlipidemia   . Hypertension   . Joint pain   . Mesenteric ischemia   . Peripheral vascular disease (HCC)   . Thyroid disease     Past Surgical History:  Procedure Laterality Date  . APPENDECTOMY    . CELIAC ARTERY STENT    . CORONARY ARTERY BYPASS GRAFT    . EMBOLECTOMY  2009   right leg  . FASCIOTOMY     right leg  . HIP FRACTURE SURGERY Left   . JOINT REPLACEMENT    . TOTAL KNEE ARTHROPLASTY     bilateral  . TUBAL LIGATION    . VISCERAL ANGIOGRAM N/A 05/31/2013   Procedure: MESSENTERIC [name];  Surgeon: [name], MD;  Location: [facility] CATH LAB;  Service: Cardiovascular;  Laterality: N/A;    Social History   Socioeconomic History  . Marital status: Widowed    Spouse name: Not on file  . Number of children: Not on file  . Years of education: Not on file  . Highest education level: Not on file  Social Needs  . Financial resource strain: Not on file  . Food insecurity - worry: Not on file  . Food insecurity - inability: Not on file  . Transportation needs - medical: Not on file  . Transportation needs - non-medical: Not on file  Occupational History  . Not on file  Tobacco Use  . Smoking status: Never Smoker  . Smokeless tobacco: Never Used  Substance and Sexual Activity  . Alcohol use: No  . Drug use: No  . Sexual activity: Not on file  Other Topics Concern  . Not on file  Social History Narrative  . Not on file    Family History  Problem Relation Age of Onset  . Cancer Mother        ovarian  . Other Father        suicide  . Coronary artery disease Sister   . Other Sister        alzheimers  . Heart disease Sister        Before age 63  . Diabetes Sister   . Cancer Daughter        spinal tumor  . Diabetes Daughter   . Hypertension Daughter   . Diabetes Son     . Hyperlipidemia Son   . Hypertension Son     ROS: Arthralgias but no fevers or chills, productive cough, hemoptysis, dysphasia, odynophagia, melena, hematochezia, dysuria, hematuria, rash, seizure activity, orthopnea, PND, claudication. Remaining systems are negative.  Physical Exam: Well-developed frail in no acute distress.  Skin is warm and dry.  HEENT is normal.  Neck is supple.  Chest is clear to auscultation  with normal expansion.  Cardiovascular exam is irregular, 2/6 systolic murmur Abdominal exam nontender or distended. No masses palpated. Extremities show 1+ edema. neuro grossly intact  A/P  1 coronary artery disease status post coronary artery bypass and graft-patient continues to do well with no chest pain.  Plan medical therapy.  Continue statin.  Not on aspirin given need for anticoagulation.  2 permanent atrial fibrillation-continue metoprolol and Cardizem for rate control.  Continue apixaban.  Plan to check hemoglobin and renal function.  May need to increase dose of Eliquis depending on creatinine.  3 chronic diastolic congestive heart failure-patient's volume status appears to be stable.  Continue present dose of Demadex.  She is taking 50 mg daily.  To take an additional 50 mg for increasing lower extremity edema or dyspnea.  We discussed the importance of low-sodium diet and fluid restriction.  4 hypertension-blood pressure is controlled.  Continue present medications.  5 hyperlipidemia-continue statin.  6 valvular heart disease-patient will need follow-up echo in the future.  7 abdominal aortic aneurysm-patient will need follow-up ultrasound in October 2019.  Followed by vascular surgery.  8 carotid artery disease-continue statin.  Follow-up carotid Dopplers November 2019.  Followed by vascular surgery.  [name], MD

## 2017-04-26 NOTE — Telephone Encounter (Signed)
These are complicated issues and outside the scope of a series of phone calls.  We need a face to face visit or a scheduled longer phone call. I can arrange a time to call [name]'s daughter if she cannot come to the appointments. Friday around noon would work for me.

## 2017-05-03 ENCOUNTER — Encounter: Payer: Self-pay | Admitting: Cardiology

## 2017-05-03 ENCOUNTER — Ambulatory Visit: Payer: Medicare PPO | Admitting: Cardiology

## 2017-05-03 VITALS — BP 149/84 | HR 69 | Ht 62.0 in | Wt 156.0 lb

## 2017-05-03 DIAGNOSIS — I5032 Chronic diastolic (congestive) heart failure: Secondary | ICD-10-CM

## 2017-05-03 DIAGNOSIS — I480 Paroxysmal atrial fibrillation: Secondary | ICD-10-CM

## 2017-05-03 DIAGNOSIS — I2510 Atherosclerotic heart disease of native coronary artery without angina pectoris: Secondary | ICD-10-CM | POA: Diagnosis not present

## 2017-05-03 DIAGNOSIS — E7800 Pure hypercholesterolemia, unspecified: Secondary | ICD-10-CM

## 2017-05-03 DIAGNOSIS — I10 Essential (primary) hypertension: Secondary | ICD-10-CM

## 2017-05-03 NOTE — Patient Instructions (Signed)

## 2017-05-04 ENCOUNTER — Telehealth: Payer: Self-pay | Admitting: *Deleted

## 2017-05-04 LAB — BASIC METABOLIC PANEL
BUN/Creatinine Ratio: 17 (calc) (ref 6–22)
BUN: 23 mg/dL (ref 7–25)
CHLORIDE: 99 mmol/L (ref 98–110)
CO2: 29 mmol/L (ref 20–32)
Calcium: 10.2 mg/dL (ref 8.6–10.4)
Creat: 1.37 mg/dL — ABNORMAL HIGH (ref 0.60–0.88)
Glucose, Bld: 180 mg/dL — ABNORMAL HIGH (ref 65–99)
POTASSIUM: 4.4 mmol/L (ref 3.5–5.3)
SODIUM: 139 mmol/L (ref 135–146)

## 2017-05-04 LAB — CBC
HEMATOCRIT: 29.3 % — AB (ref 35.0–45.0)
HEMOGLOBIN: 9.4 g/dL — AB (ref 11.7–15.5)
MCH: 31.3 pg (ref 27.0–33.0)
MCHC: 32.1 g/dL (ref 32.0–36.0)
MCV: 97.7 fL (ref 80.0–100.0)
MPV: 10.7 fL (ref 7.5–12.5)
Platelets: 554 10*3/uL — ABNORMAL HIGH (ref 140–400)
RBC: 3 10*6/uL — ABNORMAL LOW (ref 3.80–5.10)
RDW: 19.5 % — AB (ref 11.0–15.0)
WBC: 3.4 10*3/uL — AB (ref 3.8–10.8)

## 2017-05-04 NOTE — Telephone Encounter (Addendum)
-----   Message from Lewayne Bunting, MD sent at 05/04/2017  7:23 AM EST ----- Change apixaban to 5 mg BID; fu rheumatology and primary care for decreased WBC and anemia [name]   Left message for pt to call

## 2017-05-05 ENCOUNTER — Telehealth: Payer: Self-pay | Admitting: Cardiology

## 2017-05-05 MED ORDER — APIXABAN 5 MG PO TABS: 5.0000 mg | ORAL_TABLET | Freq: Two times a day (BID) | ORAL | 1 refills | Status: DC

## 2017-05-05 MED ORDER — APIXABAN 5 MG PO TABS: 5.0000 mg | ORAL_TABLET | Freq: Two times a day (BID) | ORAL | 3 refills | Status: DC

## 2017-05-05 NOTE — Addendum Note (Signed)
Addended by: Pearletha Furl on: 05/05/2017 03:03 PM   Modules accepted: Orders

## 2017-05-05 NOTE — Telephone Encounter (Signed)
Follow up    Patients daughter [name] is returning call. She ask that if she is not available to please leave a detail message with the changes of the medication. Please call.

## 2017-05-05 NOTE — Telephone Encounter (Signed)
Follow up   [name] was calling just to confirm that she received the voicemail message.

## 2017-05-05 NOTE — Telephone Encounter (Signed)
Left detailed message with recommendations and changes per Dr. [name].   Requested a call back to verify message was received.

## 2017-05-05 NOTE — Telephone Encounter (Signed)
New Message    *STAT* If patient is at the pharmacy, call can be transferred to refill team.   1. Which medications need to be refilled? (please list name of each medication and dose if known) Eliquis 5mg   2. Which pharmacy/location (including street and city if local pharmacy) is medication to be sent to? [facility] mail order  3. Do they need a 30 day or 90 day supply? 90

## 2017-05-15 ENCOUNTER — Other Ambulatory Visit: Payer: Self-pay | Admitting: Family Medicine

## 2017-05-17 ENCOUNTER — Ambulatory Visit (INDEPENDENT_AMBULATORY_CARE_PROVIDER_SITE_OTHER): Payer: Medicare PPO | Admitting: Family Medicine

## 2017-05-17 VITALS — BP 134/76 | HR 86 | Wt 156.0 lb

## 2017-05-17 DIAGNOSIS — N390 Urinary tract infection, site not specified: Secondary | ICD-10-CM

## 2017-05-17 DIAGNOSIS — I482 Chronic atrial fibrillation, unspecified: Secondary | ICD-10-CM

## 2017-05-17 DIAGNOSIS — F321 Major depressive disorder, single episode, moderate: Secondary | ICD-10-CM | POA: Diagnosis not present

## 2017-05-17 DIAGNOSIS — G4700 Insomnia, unspecified: Secondary | ICD-10-CM

## 2017-05-17 MED ORDER — LEFLUNOMIDE 10 MG PO TABS: 10.0000 mg | ORAL_TABLET | Freq: Every day | ORAL | 1 refills | Status: DC

## 2017-05-17 MED ORDER — LEVOTHYROXINE SODIUM 50 MCG PO TABS: 50.0000 ug | ORAL_TABLET | Freq: Every day | ORAL | 1 refills | Status: DC

## 2017-05-17 MED ORDER — TRAMADOL HCL 50 MG PO TABS: 50.0000 mg | ORAL_TABLET | Freq: Two times a day (BID) | ORAL | 1 refills | Status: DC | PRN

## 2017-05-17 MED ORDER — PRAVASTATIN SODIUM 40 MG PO TABS: 40.0000 mg | ORAL_TABLET | Freq: Every day | ORAL | 1 refills | Status: DC

## 2017-05-17 NOTE — Patient Instructions (Addendum)
Thank you for coming in today. Continue current medicine.  Recheck in 2 month.  Return sooner if needed.

## 2017-05-17 NOTE — Progress Notes (Signed)
[name] is a 84 y.o. female who presents to [name]: Primary Care Sports Medicine today for follow up anxiety, insomnia, Afib.   Anxiety: [name] was previously given mirtazapine for depression. Her daughter called and asked for it to be stopped. [name] is not sure if she is still taking it but thinks that she is.  She notes that she is not having a lot of anxiety issues and feels pretty well.  Insomnia: Trazodone occasionally for insomnia which works well.  She is satisfied with how well this is going.   Recurrent UTI.  [name] has a history of recurrent UTI. In the past she had taken prophylaxis medicine to prevent UTI. She feels well currently and does not want to restart chronic ABX.   Atrial fibrillation: Rate controlled with medications listed below and anticoagulated with Eliquis.  She is doing well and satisfied with her current management.  Past Medical History:  Diagnosis Date  . Acute pancreatitis   . Anemia   . Arthritis   . Atrial fibrillation (HCC)   . CAD (coronary artery disease)   . Carotid artery occlusion   . CHF (congestive heart failure) (HCC)   . Diabetes mellitus age 37  . DJD (degenerative joint disease)   . DVT (deep venous thrombosis) (HCC)   . GERD (gastroesophageal reflux disease)   . GI bleed   . Hyperlipidemia   . Hypertension   . Joint pain   . Mesenteric ischemia   . Peripheral vascular disease (HCC)   . Thyroid disease    Past Surgical History:  Procedure Laterality Date  . APPENDECTOMY    . CELIAC ARTERY STENT    . CORONARY ARTERY BYPASS GRAFT    . EMBOLECTOMY  2009   right leg  . FASCIOTOMY     right leg  . HIP FRACTURE SURGERY Left   . JOINT REPLACEMENT    . TOTAL KNEE ARTHROPLASTY     bilateral  . TUBAL LIGATION    . VISCERAL ANGIOGRAM N/A 05/31/2013   Procedure: MESSENTERIC [name];  Surgeon: [name], MD;  Location:  [facility] CATH LAB;  Service: Cardiovascular;  Laterality: N/A;   Social History   Tobacco Use  . Smoking status: Never Smoker  . Smokeless tobacco: Never Used  Substance Use Topics  . Alcohol use: No   family history includes Cancer in her daughter and mother; Coronary artery disease in her sister; Diabetes in her daughter, sister, and son; Heart disease in her sister; Hyperlipidemia in her son; Hypertension in her daughter and son; Other in her father and sister.  ROS as above:  Medications: Current Outpatient Medications  Medication Sig Dispense Refill  . AMBULATORY NON FORMULARY MEDICATION Please check INR weekly for 2 months. Send reports to Dr Denyse Amass fax [phone] 1 each 0  . apixaban (ELIQUIS) 5 MG TABS tablet Take 1 tablet (5 mg total) by mouth 2 (two) times daily. Take in substitution of warfarin if affordable 180 tablet 1  . Calcium Carb-Cholecalciferol 600-800 MG-UNIT TABS TAKE ONE TABLET BY MOUTH 2 TIMES A DAY 60 tablet 6  . clonazePAM (KLONOPIN) 0.5 MG tablet TAKE 1 TABLET BY MOUTH AT BEDTIME 30 tablet 5  . conjugated estrogens (PREMARIN) vaginal cream Place 1 Applicatorful vaginally daily. 42.5 g 12  . diltiazem (CARDIZEM CD) 180 MG 24 hr capsule Take 1 capsule (180 mg total) by mouth at bedtime. 90 capsule 3  . Ergocalciferol (VITAMIN D2) 2000 units TABS  Take 2,000 Units by mouth daily.     . folic acid (FOLVITE) 1 MG tablet Take 1 tablet (1 mg total) by mouth daily. 90 tablet 3  . glucose blood test strip Once daily E11.29 100 each 12  . iron polysaccharides (FERREX 150) 150 MG capsule Take 1 capsule (150 mg total) by mouth daily. 90 capsule 3  . leflunomide (ARAVA) 10 MG tablet TAKE 1 TABLET AT BEDTIME. 90 tablet 1  . levothyroxine (SYNTHROID, LEVOTHROID) 50 MCG tablet TAKE 1 TABLET (50 MCG TOTAL) BY MOUTH DAILY. 90 tablet 1  . metoprolol succinate (TOPROL-XL) 50 MG 24 hr tablet Take 1 tablet (50 mg total) by mouth daily. 90 tablet 3  . mirtazapine (REMERON) 7.5 MG tablet  Take 1 tablet (7.5 mg total) by mouth at bedtime. 30 tablet 0  . pravastatin (PRAVACHOL) 40 MG tablet TAKE 1 TABLET EVERY DAY 90 tablet 1  . STOOL SOFTENER 100 MG capsule TAKE 1 CAPSULE BY MOUTH 2 TIMES A DAY (AM & PM) (Patient taking differently: TAKE 100 MG BY MOUTH 2 TIMES A DAY (AM & PM)) 56 capsule 0  . torsemide (DEMADEX) 100 MG tablet Take 0.5 tablets (50 mg total) by mouth 2 (two) times daily. 90 tablet 1  . traMADol (ULTRAM) 50 MG tablet Take 1 tablet (50 mg total) by mouth 2 (two) times daily as needed. Please fax to [facility]. 180 tablet 1  . traZODone (DESYREL) 50 MG tablet Take 0.5-1 tablets (25-50 mg total) by mouth at bedtime as needed for sleep. 90 tablet 0  . vitamin B-12 (CYANOCOBALAMIN) 1000 MCG tablet Take 1,000 mcg by mouth daily.     No current facility-administered medications for this visit.    Allergies  Allergen Reactions  . Cefuroxime Anaphylaxis and Other (See Comments)    Throat swelling  . Fish Allergy Shortness Of Breath    Pt said she has throat swelling   . Ciprofloxacin Rash  . Codeine Other (See Comments)    Jittery Jittery  . Sertraline Hcl Rash  . Cephalexin Rash    Health Maintenance Health Maintenance  Topic Date Due  . URINE MICROALBUMIN  01/22/1940  . FOOT EXAM  07/26/2017  . HEMOGLOBIN A1C  09/13/2017  . OPHTHALMOLOGY EXAM  02/23/2018  . TETANUS/TDAP  12/28/2025  . INFLUENZA VACCINE  Completed  . PNA vac Low Risk Adult  Completed     Exam:  BP 134/76   Pulse 86   Wt 156 lb (70.8 kg)   BMI 28.53 kg/m  Gen: Well NAD HEENT: EOMI,  MMM Lungs: Normal work of breathing. CTABL Heart: Irreg no MRG Abd: NABS, Soft. Nondistended, Nontender Exts: Brisk capillary refill, warm and well perfused.  Psych: Alert and oriented. Normal speech and thought process and affect.  Depression screen [facility] 2/9 05/17/2017 01/12/2017 12/01/2016 11/02/2016  Decreased Interest 1 3 3  0  Down, Depressed, Hopeless 1 3 3  0  PHQ - 2 Score 2 6 6  0  Altered sleeping 1  1 3  -  Tired, decreased energy 1 2 3  -  Change in appetite 2 0 0 -  Feeling bad or failure about yourself  0 0 0 -  Trouble concentrating 1 3 3  -  Moving slowly or fidgety/restless 1 2 0 -  Suicidal thoughts 1 0 0 -  PHQ-9 Score 9 14 15  -  Difficult doing work/chores Not difficult at all - - -   GAD 7 : Generalized Anxiety Score 05/17/2017 01/12/2017 12/01/2016  Nervous, Anxious, on Edge  0 3 2  Control/stop worrying 1 1 0  Worry too much - different things 0 3 0  Trouble relaxing 1 1 3   Restless 0 3 3  Easily annoyed or irritable 0 1 3  Afraid - awful might happen 1 3 3   Total GAD 7 Score 3 15 14   Anxiety Difficulty Not difficult at all - -       No results found for this or any previous visit (from the past 72 hour(s)). No results found.    Assessment and Plan: 84 y.o. female with  Mood: Continue current medicine. Will try to clarify with daughter if she is taking mirtazapine or not. Recheck in 2 months.   Insomnia: Doing well continue current medicine.   Recurrent UTI: Doing well. Jadeyn has multiple drug allergies and CKD. Chronic PPx medicine choices are very limited. Continue current intermittent management.    Afib: Dong well with co-management with cardiology. Will continue to follow.    No orders of the defined types were placed in this encounter.  No orders of the defined types were placed in this encounter.    Discussed warning signs or symptoms. Please see discharge instructions. Patient expresses understanding.

## 2017-05-18 ENCOUNTER — Telehealth: Payer: Self-pay

## 2017-05-18 DIAGNOSIS — I482 Chronic atrial fibrillation, unspecified: Secondary | ICD-10-CM

## 2017-05-18 NOTE — Telephone Encounter (Signed)
Patient daughter is returning call from her mothers visit on yesterday. She would like a call back from provider. Caitlyn Buchanan,CMA

## 2017-05-19 NOTE — Telephone Encounter (Signed)
[name] called back and scheduled a time for Tuesday May 23, 2017 @ 12:15 pm to talk about her moms visit. patient stated that she can be reached on her cell phone [phone]. Rhonda Cunningham,CMA

## 2017-05-19 NOTE — Telephone Encounter (Signed)
I tried to call [name] back.  I left a message recommending that she schedule a time to speak with me when we can both talk.

## 2017-05-22 DIAGNOSIS — M2550 Pain in unspecified joint: Secondary | ICD-10-CM | POA: Diagnosis not present

## 2017-05-22 DIAGNOSIS — M0579 Rheumatoid arthritis with rheumatoid factor of multiple sites without organ or systems involvement: Secondary | ICD-10-CM | POA: Diagnosis not present

## 2017-05-22 DIAGNOSIS — E663 Overweight: Secondary | ICD-10-CM | POA: Diagnosis not present

## 2017-05-22 DIAGNOSIS — Z79899 Other long term (current) drug therapy: Secondary | ICD-10-CM | POA: Diagnosis not present

## 2017-05-22 DIAGNOSIS — D72819 Decreased white blood cell count, unspecified: Secondary | ICD-10-CM | POA: Diagnosis not present

## 2017-05-22 DIAGNOSIS — Z6828 Body mass index (BMI) 28.0-28.9, adult: Secondary | ICD-10-CM | POA: Diagnosis not present

## 2017-05-22 DIAGNOSIS — M1009 Idiopathic gout, multiple sites: Secondary | ICD-10-CM | POA: Diagnosis not present

## 2017-05-22 NOTE — Telephone Encounter (Signed)
I have a meeting at noon on Tuesday. Please reschedule.

## 2017-05-22 NOTE — Telephone Encounter (Signed)
Left detailed message on patient vm to call back with a good time to speak to Dr. Denyse Amass about her moms office visit. Rhonda Cunningham,CMA

## 2017-05-22 NOTE — Telephone Encounter (Signed)
Patient daughter called back and stated that Wednsday 05/24/2017 @12 :15 is a good time to call her cell phone and discuss her mom office visit. Melbourne Jakubiak,CMA

## 2017-05-24 MED ORDER — TORSEMIDE 100 MG PO TABS: 50.0000 mg | ORAL_TABLET | Freq: Two times a day (BID) | ORAL | 1 refills | Status: DC

## 2017-05-24 MED ORDER — DILTIAZEM HCL ER COATED BEADS 180 MG PO CP24: 180.0000 mg | ORAL_CAPSULE | Freq: Every day | ORAL | 3 refills | Status: AC

## 2017-05-24 MED ORDER — CLONAZEPAM 0.5 MG PO TABS: 0.5000 mg | ORAL_TABLET | Freq: Every day | ORAL | 1 refills | Status: DC

## 2017-05-24 MED ORDER — TRAMADOL HCL 50 MG PO TABS: 50.0000 mg | ORAL_TABLET | Freq: Two times a day (BID) | ORAL | 1 refills | Status: DC | PRN

## 2017-05-24 MED ORDER — LEVOTHYROXINE SODIUM 50 MCG PO TABS: 50.0000 ug | ORAL_TABLET | Freq: Every day | ORAL | 1 refills | Status: DC

## 2017-05-24 MED ORDER — METOPROLOL SUCCINATE ER 50 MG PO TB24: 50.0000 mg | ORAL_TABLET | Freq: Every day | ORAL | 3 refills | Status: DC

## 2017-05-24 MED ORDER — FOLIC ACID 1 MG PO TABS: 1.0000 mg | ORAL_TABLET | Freq: Every day | ORAL | 3 refills | Status: AC

## 2017-05-24 MED ORDER — LEFLUNOMIDE 10 MG PO TABS: 10.0000 mg | ORAL_TABLET | Freq: Every day | ORAL | 1 refills | Status: DC

## 2017-05-24 MED ORDER — PRAVASTATIN SODIUM 40 MG PO TABS: 40.0000 mg | ORAL_TABLET | Freq: Every day | ORAL | 1 refills | Status: DC

## 2017-05-24 MED ORDER — POLYSACCHARIDE IRON COMPLEX 150 MG PO CAPS: 150.0000 mg | ORAL_CAPSULE | Freq: Every day | ORAL | 3 refills | Status: DC

## 2017-05-24 NOTE — Telephone Encounter (Signed)
I had a long discussion with [name] about several issues:  Possible dementia: Seems to be doing well hold off on neuropsych testing for now and hold off on medicines.  Mood: Doing well.  Will confirm discontinuing mirtazapine and continue to follow  Frequent urinary tract infection.  I discussed why I think a bad idea right now to be taking the antibiotic daily to prevent UTIs.  Last confirmed urinary tract infection was August.  Continue to follow along  Fatigue and anemia: We will check back on this issue at the next follow-up visit and check hemoglobin and iron stores along with thyroid etc.  Medications refilled listed below

## 2017-05-24 NOTE — Addendum Note (Signed)
Addended by: Rodolph Bong on: 05/24/2017 12:42 PM   Modules accepted: Orders

## 2017-06-06 DIAGNOSIS — L97522 Non-pressure chronic ulcer of other part of left foot with fat layer exposed: Secondary | ICD-10-CM | POA: Diagnosis not present

## 2017-06-06 DIAGNOSIS — E11621 Type 2 diabetes mellitus with foot ulcer: Secondary | ICD-10-CM | POA: Diagnosis not present

## 2017-06-15 ENCOUNTER — Other Ambulatory Visit: Payer: Self-pay

## 2017-06-15 ENCOUNTER — Ambulatory Visit (HOSPITAL_COMMUNITY)
Admission: RE | Admit: 2017-06-15 | Discharge: 2017-06-15 | Disposition: A | Discharge status: Home / Self Care | Payer: Medicare PPO | Source: Ambulatory Visit | Attending: Vascular Surgery | Admitting: Vascular Surgery

## 2017-06-15 ENCOUNTER — Encounter: Payer: Self-pay | Admitting: Vascular Surgery

## 2017-06-15 ENCOUNTER — Ambulatory Visit: Payer: Medicare PPO | Admitting: Vascular Surgery

## 2017-06-15 VITALS — BP 139/74 | HR 83 | Temp 98.9°F | Resp 20 | Ht 62.0 in | Wt 156.0 lb

## 2017-06-15 DIAGNOSIS — K551 Chronic vascular disorders of intestine: Secondary | ICD-10-CM | POA: Insufficient documentation

## 2017-06-15 DIAGNOSIS — K55069 Acute infarction of intestine, part and extent unspecified: Secondary | ICD-10-CM | POA: Diagnosis not present

## 2017-06-15 DIAGNOSIS — R109 Unspecified abdominal pain: Secondary | ICD-10-CM | POA: Diagnosis not present

## 2017-06-15 NOTE — Progress Notes (Signed)
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  She denies any abdominal pain.  She has no difficulty eating food.  She is not gaining or losing weight.  She continues to live at an assisted living [facility].  Overall she feels well.  Physical exam:  Vitals:   06/15/17 0942  BP: 139/74  Pulse: 83  Resp: 20  Temp: 98.9 F (37.2 C)  TempSrc: Oral  SpO2: 95%  Weight: 156 lb (70.8 kg)  Height: 5\' 2"  (1.575 m)    Abdomen: Soft nontender nondistended no bruit  Extremities: 2+ femoral pulses bilaterally  Cardiac: Regular rate and rhythm  Neck: No carotid bruits  Data: Patient had a duplex ultrasound of her visceral vessels today.  This showed peak systolic velocity in the mid celiac of 276 cm/s.  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  Plan: The patient will follow-up in 1 year with a repeat mesenteric duplex.  She will follow-up sooner if she develops symptoms of weight loss or difficulty eating.  , MD [facility] Office: [phone] Pager: [phone]

## 2017-06-23 ENCOUNTER — Other Ambulatory Visit: Payer: Self-pay

## 2017-06-23 MED ORDER — TRAMADOL HCL 50 MG PO TABS: 50.0000 mg | ORAL_TABLET | Freq: Two times a day (BID) | ORAL | 1 refills | Status: DC | PRN

## 2017-06-26 ENCOUNTER — Telehealth: Payer: Self-pay | Admitting: Family Medicine

## 2017-06-26 NOTE — Telephone Encounter (Signed)
Spoke with Pt's daughter, [name]. Advised we have sent the Rx to [facility] on 06/23/17. She reports she found out that the Rx would be cheaper if they get it for a 90 day supply from [facility]. Will route to PCP for review.

## 2017-06-26 NOTE — Telephone Encounter (Signed)
[name](Cheryl. Budden daughter) called. [name] needs a refill on her Tramadol. [name] says she left a message last week on CMA's vm and has not gotten a call back and wants to be called back today . [name]'s ph # [phone]

## 2017-06-27 MED ORDER — TRAMADOL HCL 50 MG PO TABS: 50.0000 mg | ORAL_TABLET | Freq: Two times a day (BID) | ORAL | 1 refills | Status: DC | PRN

## 2017-06-27 NOTE — Addendum Note (Signed)
Addended by: Rodolph Bong on: 06/27/2017 12:56 PM   Modules accepted: Orders

## 2017-06-27 NOTE — Telephone Encounter (Signed)
[facility]

## 2017-06-27 NOTE — Telephone Encounter (Signed)
[facility], they had not filled the Rx. Cancelled Rx. OK to send to [facility].

## 2017-06-27 NOTE — Telephone Encounter (Signed)
Kelsi please call [facility] pharmacy and see if they sent the tramadol. If they did not send the tramadol please cancel it and I will send a new Rx to [facility]. Please let me know when the new rx needs to be sent.

## 2017-06-28 DIAGNOSIS — L97522 Non-pressure chronic ulcer of other part of left foot with fat layer exposed: Secondary | ICD-10-CM | POA: Diagnosis not present

## 2017-06-28 DIAGNOSIS — E11621 Type 2 diabetes mellitus with foot ulcer: Secondary | ICD-10-CM | POA: Diagnosis not present

## 2017-06-28 DIAGNOSIS — L97412 Non-pressure chronic ulcer of right heel and midfoot with fat layer exposed: Secondary | ICD-10-CM | POA: Diagnosis not present

## 2017-07-18 DIAGNOSIS — M0579 Rheumatoid arthritis with rheumatoid factor of multiple sites without organ or systems involvement: Secondary | ICD-10-CM | POA: Diagnosis not present

## 2017-07-18 DIAGNOSIS — Z79899 Other long term (current) drug therapy: Secondary | ICD-10-CM | POA: Diagnosis not present

## 2017-07-18 DIAGNOSIS — E663 Overweight: Secondary | ICD-10-CM | POA: Diagnosis not present

## 2017-07-18 DIAGNOSIS — M2550 Pain in unspecified joint: Secondary | ICD-10-CM | POA: Diagnosis not present

## 2017-07-18 DIAGNOSIS — Z6827 Body mass index (BMI) 27.0-27.9, adult: Secondary | ICD-10-CM | POA: Diagnosis not present

## 2017-07-18 DIAGNOSIS — R21 Rash and other nonspecific skin eruption: Secondary | ICD-10-CM | POA: Diagnosis not present

## 2017-07-18 DIAGNOSIS — M1009 Idiopathic gout, multiple sites: Secondary | ICD-10-CM | POA: Diagnosis not present

## 2017-07-19 ENCOUNTER — Encounter: Payer: Self-pay | Admitting: Family Medicine

## 2017-07-19 ENCOUNTER — Ambulatory Visit (INDEPENDENT_AMBULATORY_CARE_PROVIDER_SITE_OTHER): Payer: Medicare PPO | Admitting: Family Medicine

## 2017-07-19 VITALS — BP 123/72 | HR 80 | Wt 154.0 lb

## 2017-07-19 DIAGNOSIS — I700 Atherosclerosis of aorta: Secondary | ICD-10-CM

## 2017-07-19 DIAGNOSIS — E1122 Type 2 diabetes mellitus with diabetic chronic kidney disease: Secondary | ICD-10-CM

## 2017-07-19 DIAGNOSIS — I482 Chronic atrial fibrillation, unspecified: Secondary | ICD-10-CM

## 2017-07-19 DIAGNOSIS — K551 Chronic vascular disorders of intestine: Secondary | ICD-10-CM | POA: Diagnosis not present

## 2017-07-19 DIAGNOSIS — Z6828 Body mass index (BMI) 28.0-28.9, adult: Secondary | ICD-10-CM

## 2017-07-19 DIAGNOSIS — N183 Chronic kidney disease, stage 3 unspecified: Secondary | ICD-10-CM

## 2017-07-19 DIAGNOSIS — I5032 Chronic diastolic (congestive) heart failure: Secondary | ICD-10-CM

## 2017-07-19 DIAGNOSIS — E039 Hypothyroidism, unspecified: Secondary | ICD-10-CM

## 2017-07-19 DIAGNOSIS — R21 Rash and other nonspecific skin eruption: Secondary | ICD-10-CM

## 2017-07-19 DIAGNOSIS — E119 Type 2 diabetes mellitus without complications: Secondary | ICD-10-CM

## 2017-07-19 LAB — POCT GLYCOSYLATED HEMOGLOBIN (HGB A1C): HEMOGLOBIN A1C: 6.4

## 2017-07-19 MED ORDER — TRIAMCINOLONE ACETONIDE 0.5 % EX CREA: 1.0000 "application " | TOPICAL_CREAM | Freq: Two times a day (BID) | CUTANEOUS | 3 refills | Status: DC

## 2017-07-19 NOTE — Patient Instructions (Addendum)
Thank you for coming in today. Continue current medicines.  STOP allegra.  Start the new tube to triamcinolone cream 2x daily for itching  Stop the cream when the rash gets better.  Use over the counter gold bond itch lotion as needed.  You can try over the counter calaratin or zyrtec for allergies.   Recheck in 2 months.  Return sooner if neede.d    If that rash does not improve let me know.     Rash A rash is a change in the color of the skin. A rash can also change the way your skin feels. There are many different conditions and factors that can cause a rash. Follow these instructions at home: Pay attention to any changes in your symptoms. Follow these instructions to help with your condition: Medicine Take or apply over-the-counter and prescription medicines only as told by your health care provider. These may include:  Corticosteroid cream.  Anti-itch lotions.  Oral antihistamines.  Skin Care  Apply cool compresses to the affected areas.  Try taking a bath with: ? Epsom salts. Follow the instructions on the packaging. You can get these at your local pharmacy or grocery store. ? Baking soda. Pour a small amount into the bath as told by your health care provider. ? Colloidal oatmeal. Follow the instructions on the packaging. You can get this at your local pharmacy or grocery store.  Try applying baking soda paste to your skin. Stir water into baking soda until it reaches a paste-like consistency.  Do not scratch or rub your skin.  Avoid covering the rash. Make sure the rash is exposed to air as much as possible. General instructions  Avoid hot showers or baths, which can make itching worse. A cold shower may help.  Avoid scented soaps, detergents, and perfumes. Use gentle soaps, detergents, perfumes, and other cosmetic products.  Avoid any substance that causes your rash. Keep a journal to help track what causes your rash. Write down: ? What you eat. ? What  cosmetic products you use. ? What you drink. ? What you wear. This includes jewelry.  Keep all follow-up visits as told by your health care provider. This is important. Contact a health care provider if:  You sweat at night.  You lose weight.  You urinate more than normal.  You feel weak.  You vomit.  Your skin or the whites of your eyes look yellow (jaundice).  Your skin: ? Tingles. ? Is numb.  Your rash: ? Does not go away after several days. ? Gets worse.  You are: ? Unusually thirsty. ? More tired than normal.  You have: ? New symptoms. ? Pain in your abdomen. ? A fever. ? Diarrhea. Get help right away if:  You develop a rash that covers all or most of your body. The rash may or may not be painful.  You develop blisters that: ? Are on top of the rash. ? Grow larger or grow together. ? Are painful. ? Are inside your nose or mouth.  You develop a rash that: ? Looks like purple pinprick-sized spots all over your body. ? Has a "bull's eye" or looks like a target. ? Is not related to sun exposure, is red and painful, and causes your skin to peel. This information is not intended to replace advice given to you by your health care provider. Make sure you discuss any questions you have with your health care provider. Document Released: 03/18/2002 Document Revised: 09/01/2015 Document Reviewed: 08/13/2014  Elsevier Interactive Patient Education  2018 Elsevier Inc.  

## 2017-07-19 NOTE — Progress Notes (Signed)
[name] is a 84 y.o. female who presents to [name]: Primary [facility] Medicine today for rash, A. fib/CHF, kidney disease, thyroid, diabetes.   Rash: [name] notes a several day history of an itchy rash on her forearms bilaterally.  The rash occurred when she started taking Allegra for seasonal allergy symptoms.  She is tried some 0.1% triamcinolone cream previously prescribed which has not helped a lot.  She denies any fevers or chills flulike illness or other rash.  She feels pretty well otherwise and notes that the mildly itchy.  Atrial fibrillation and CHF: Managed with medications below.  Doing well with no shortness of breath palpitations or chest pain.  She denies any leg swelling and notes that her weight is stable.  She feels satisfied with how things are going.  Chronic kidney disease: Stage III.  Doing well without leg swelling as noted below.  Hypothyroidism: Doing well with levothyroxine.  No feeling too cold or too hot.  Diabetes: Doing well with diet control.  No polyuria or polydipsia hypo-or hyperglycemic episodes.   Past Medical History:  Diagnosis Date  . Acute pancreatitis   . Anemia   . Arthritis   . Atrial fibrillation (HCC)   . CAD (coronary artery disease)   . Carotid artery occlusion   . CHF (congestive heart failure) (HCC)   . Diabetes mellitus age 66  . DJD (degenerative joint disease)   . DVT (deep venous thrombosis) (HCC)   . GERD (gastroesophageal reflux disease)   . GI bleed   . Hyperlipidemia   . Hypertension   . Joint pain   . Mesenteric ischemia   . Peripheral vascular disease (HCC)   . Thyroid disease    Past Surgical History:  Procedure Laterality Date  . APPENDECTOMY    . CELIAC ARTERY STENT    . CORONARY ARTERY BYPASS GRAFT    . EMBOLECTOMY  2009   right leg  . FASCIOTOMY     right leg  . HIP FRACTURE SURGERY Left   . JOINT  REPLACEMENT    . TOTAL KNEE ARTHROPLASTY     bilateral  . TUBAL LIGATION    . VISCERAL ANGIOGRAM N/A 05/31/2013   Procedure: MESSENTERIC [name];  Surgeon: [name], MD;  Location: [facility] CATH LAB;  Service: Cardiovascular;  Laterality: N/A;   Social History   Tobacco Use  . Smoking status: Never Smoker  . Smokeless tobacco: Never Used  Substance Use Topics  . Alcohol use: No   family history includes Cancer in her daughter and mother; Coronary artery disease in her sister; Diabetes in her daughter, sister, and son; Heart disease in her sister; Hyperlipidemia in her son; Hypertension in her daughter and son; Other in her father and sister.  ROS as above:  Medications: Current Outpatient Medications  Medication Sig Dispense Refill  . AMBULATORY NON FORMULARY MEDICATION Please check INR weekly for 2 months. Send reports to Dr Denyse Amass fax [phone] 1 each 0  . apixaban (ELIQUIS) 5 MG TABS tablet Take 1 tablet (5 mg total) by mouth 2 (two) times daily. Take in substitution of warfarin if affordable 180 tablet 1  . Calcium Carb-Cholecalciferol 600-800 MG-UNIT TABS TAKE ONE TABLET BY MOUTH 2 TIMES A DAY 60 tablet 6  . clonazePAM (KLONOPIN) 0.5 MG tablet Take 1 tablet (0.5 mg total) by mouth at bedtime. 90 tablet 1  . conjugated estrogens (PREMARIN) vaginal cream Place 1 Applicatorful vaginally daily. 42.5 g  12  . diltiazem (CARDIZEM CD) 180 MG 24 hr capsule Take 1 capsule (180 mg total) by mouth at bedtime. 90 capsule 3  . Ergocalciferol (VITAMIN D2) 2000 units TABS Take 2,000 Units by mouth daily.     . febuxostat (ULORIC) 40 MG tablet Take 40 mg by mouth daily.    . folic acid (FOLVITE) 1 MG tablet Take 1 tablet (1 mg total) by mouth daily. 90 tablet 3  . glucose blood test strip Once daily E11.29 100 each 12  . iron polysaccharides (FERREX 150) 150 MG capsule Take 1 capsule (150 mg total) by mouth daily. 90 capsule 3  . leflunomide (ARAVA) 10 MG tablet Take 1 tablet (10 mg total)  by mouth at bedtime. 90 tablet 1  . levothyroxine (SYNTHROID, LEVOTHROID) 50 MCG tablet Take 1 tablet (50 mcg total) by mouth daily. 90 tablet 1  . metoprolol succinate (TOPROL-XL) 50 MG 24 hr tablet Take 1 tablet (50 mg total) by mouth daily. 90 tablet 3  . pravastatin (PRAVACHOL) 40 MG tablet Take 1 tablet (40 mg total) by mouth daily. 90 tablet 1  . STOOL SOFTENER 100 MG capsule TAKE 1 CAPSULE BY MOUTH 2 TIMES A DAY (AM & PM) (Patient taking differently: TAKE 100 MG BY MOUTH 2 TIMES A DAY (AM & PM)) 56 capsule 0  . torsemide (DEMADEX) 100 MG tablet Take 0.5 tablets (50 mg total) by mouth 2 (two) times daily. 180 tablet 1  . traMADol (ULTRAM) 50 MG tablet Take 1 tablet (50 mg total) by mouth 2 (two) times daily as needed. Please fax to [facility]. 180 tablet 1  . traZODone (DESYREL) 50 MG tablet Take 0.5-1 tablets (25-50 mg total) by mouth at bedtime as needed for sleep. 90 tablet 0  . vitamin B-12 (CYANOCOBALAMIN) 1000 MCG tablet Take 1,000 mcg by mouth daily.     No current facility-administered medications for this visit.    Allergies  Allergen Reactions  . Cefuroxime Anaphylaxis and Other (See Comments)    Throat swelling  . Fish Allergy Shortness Of Breath    Pt said she has throat swelling   . Ciprofloxacin Rash  . Codeine Other (See Comments)    Jittery Jittery  . Sertraline Hcl Rash  . Cephalexin Rash    Health Maintenance Health Maintenance  Topic Date Due  . FOOT EXAM  07/26/2017  . HEMOGLOBIN A1C  09/13/2017  . INFLUENZA VACCINE  11/09/2017  . OPHTHALMOLOGY EXAM  02/23/2018  . TETANUS/TDAP  12/28/2025  . PNA vac Low Risk Adult  Completed     Exam:  BP 123/72   Pulse 80   Wt 154 lb (69.9 kg)   BMI 28.17 kg/m   Wt Readings from Last 5 Encounters:  07/19/17 154 lb (69.9 kg)  06/15/17 156 lb (70.8 kg)  05/17/17 156 lb (70.8 kg)  05/03/17 156 lb (70.8 kg)  04/06/17 164 lb (74.4 kg)    Gen: Well NAD HEENT: EOMI,  MMM Lungs: Normal work of breathing.  CTABL Heart: RRR no MRG Abd: NABS, Soft. Nondistended, Nontender Exts: Brisk capillary refill, warm and well perfused.  Skin: Slightly excoriated macular mildly erythematous rash on forearms.  Nontender.  Lab Results  Component Value Date   HGBA1C 6.4 07/19/2017     Assessment and Plan: 84 y.o. female with  Rash: Unclear etiology.  It is possible that she has an allergic reaction to Allegra but this is less likely.  Reasonable to discontinue Allegra and use a stronger triamcinolone cream  at 0.5%.  If not improving recheck as needed.  Cardiac: Doing well with medication management.  Continue current regimen and recheck in a few months.  Hypothyroidism: Doing well.  Will check TSH with next labs.  CKD: Doing well creatinine stable at last 2 checks.  Recheck in the near future.  Diabetes: Doing well.  A1c 6.4 today.  Continue careful diet.  Recheck 2 months.   Orders Placed This Encounter  Procedures  . POCT HgB A1C   No orders of the defined types were placed in this encounter.    Discussed warning signs or symptoms. Please see discharge instructions. Patient expresses understanding.

## 2017-07-20 ENCOUNTER — Telehealth: Payer: Self-pay

## 2017-07-20 ENCOUNTER — Other Ambulatory Visit: Payer: Self-pay | Admitting: Family Medicine

## 2017-07-20 NOTE — Telephone Encounter (Signed)
Left message on patient daughter vm to call me back on whats a good time for Friday to call her. Rhonda Cunningham,CMA

## 2017-07-20 NOTE — Telephone Encounter (Signed)
Patient daughter called and wants to set up a good time where she can talk to the provider concerning her mother from 07/19/2017. Please call the patient daughter on her cell phone [phone] to set up a time. Rhonda Cunningham,CMA

## 2017-07-20 NOTE — Telephone Encounter (Signed)
Ask for Friday from 12-230. Have her pick a specific time

## 2017-07-20 NOTE — Telephone Encounter (Signed)
Patient daughter called back and stated that the next 2 Fridays are not good for her, she stated that the only days she will have available is April 23 and 24th. So what time and day works for you. [name],CMA

## 2017-07-21 NOTE — Telephone Encounter (Signed)
Patient daughter has been informed and she was advised to called the day before to remind Dr. Denyse Amass of the time as I will be out of the office. Rhonda Cunningham,CMA

## 2017-07-21 NOTE — Telephone Encounter (Signed)
We could do 12-1 those days?

## 2017-07-26 DIAGNOSIS — L602 Onychogryphosis: Secondary | ICD-10-CM | POA: Diagnosis not present

## 2017-07-26 DIAGNOSIS — L97412 Non-pressure chronic ulcer of right heel and midfoot with fat layer exposed: Secondary | ICD-10-CM | POA: Diagnosis not present

## 2017-07-26 DIAGNOSIS — E11621 Type 2 diabetes mellitus with foot ulcer: Secondary | ICD-10-CM | POA: Diagnosis not present

## 2017-07-26 DIAGNOSIS — E1142 Type 2 diabetes mellitus with diabetic polyneuropathy: Secondary | ICD-10-CM | POA: Diagnosis not present

## 2017-08-11 ENCOUNTER — Telehealth: Payer: Self-pay | Admitting: Family Medicine

## 2017-08-11 NOTE — Telephone Encounter (Signed)
Left message on patient daughter vm advising that patient should keep her follow up appt with kidney doctor. Rhonda Cunningham,CMA

## 2017-08-11 NOTE — Telephone Encounter (Signed)
Pt daughter called and stated that her mother has an appointment with Dr. Darrick Penna for her 6 month fu on her  kidneys but she states her mother seems to be doing well and wants to know if she should keep that appointment or if you can monitor her Kidneys. Thanks

## 2017-08-11 NOTE — Telephone Encounter (Signed)
Its a good idea to keep that appointment.

## 2017-08-14 DIAGNOSIS — L281 Prurigo nodularis: Secondary | ICD-10-CM | POA: Diagnosis not present

## 2017-08-15 DIAGNOSIS — I5032 Chronic diastolic (congestive) heart failure: Secondary | ICD-10-CM | POA: Diagnosis not present

## 2017-08-15 DIAGNOSIS — I2510 Atherosclerotic heart disease of native coronary artery without angina pectoris: Secondary | ICD-10-CM | POA: Diagnosis not present

## 2017-08-15 DIAGNOSIS — D631 Anemia in chronic kidney disease: Secondary | ICD-10-CM | POA: Diagnosis not present

## 2017-08-15 DIAGNOSIS — I739 Peripheral vascular disease, unspecified: Secondary | ICD-10-CM | POA: Diagnosis not present

## 2017-08-15 DIAGNOSIS — I4891 Unspecified atrial fibrillation: Secondary | ICD-10-CM | POA: Diagnosis not present

## 2017-08-15 DIAGNOSIS — N184 Chronic kidney disease, stage 4 (severe): Secondary | ICD-10-CM | POA: Diagnosis not present

## 2017-08-15 DIAGNOSIS — E1122 Type 2 diabetes mellitus with diabetic chronic kidney disease: Secondary | ICD-10-CM | POA: Diagnosis not present

## 2017-08-15 DIAGNOSIS — I129 Hypertensive chronic kidney disease with stage 1 through stage 4 chronic kidney disease, or unspecified chronic kidney disease: Secondary | ICD-10-CM | POA: Diagnosis not present

## 2017-08-15 DIAGNOSIS — N2581 Secondary hyperparathyroidism of renal origin: Secondary | ICD-10-CM | POA: Diagnosis not present

## 2017-08-15 DIAGNOSIS — N189 Chronic kidney disease, unspecified: Secondary | ICD-10-CM | POA: Diagnosis not present

## 2017-08-15 LAB — IRON,TIBC AND FERRITIN PANEL
%SAT: 17
Iron: 42
TIBC: 250
UIBC: 208

## 2017-08-15 LAB — CBC AND DIFFERENTIAL
HCT: 26 — AB (ref 36–46)
HEMOGLOBIN: 8.4 — AB (ref 12.0–16.0)
Platelets: 178 (ref 150–399)
WBC: 4.5

## 2017-08-15 LAB — BASIC METABOLIC PANEL: Creatinine: 1.1 (ref 0.5–1.1)

## 2017-08-16 DIAGNOSIS — N390 Urinary tract infection, site not specified: Secondary | ICD-10-CM | POA: Diagnosis not present

## 2017-08-17 DIAGNOSIS — D649 Anemia, unspecified: Secondary | ICD-10-CM | POA: Diagnosis not present

## 2017-08-25 ENCOUNTER — Encounter (HOSPITAL_COMMUNITY): Payer: Medicare PPO

## 2017-08-29 ENCOUNTER — Encounter (HOSPITAL_COMMUNITY): Payer: Medicare PPO

## 2017-08-29 DIAGNOSIS — M2042 Other hammer toe(s) (acquired), left foot: Secondary | ICD-10-CM | POA: Diagnosis not present

## 2017-08-29 DIAGNOSIS — E1142 Type 2 diabetes mellitus with diabetic polyneuropathy: Secondary | ICD-10-CM | POA: Diagnosis not present

## 2017-08-29 DIAGNOSIS — E11621 Type 2 diabetes mellitus with foot ulcer: Secondary | ICD-10-CM | POA: Diagnosis not present

## 2017-08-29 DIAGNOSIS — M2041 Other hammer toe(s) (acquired), right foot: Secondary | ICD-10-CM | POA: Diagnosis not present

## 2017-08-29 DIAGNOSIS — L97421 Non-pressure chronic ulcer of left heel and midfoot limited to breakdown of skin: Secondary | ICD-10-CM | POA: Diagnosis not present

## 2017-08-30 ENCOUNTER — Encounter: Payer: Self-pay | Admitting: Family Medicine

## 2017-09-01 ENCOUNTER — Other Ambulatory Visit (HOSPITAL_COMMUNITY): Payer: Self-pay

## 2017-09-01 ENCOUNTER — Telehealth: Payer: Self-pay | Admitting: Family Medicine

## 2017-09-01 NOTE — Telephone Encounter (Signed)
Pt's daughter called to get Rx directions/quantity changed for [facility]. She reports the Pt has been taking this BID. If that is correct a new Rx should be sent to mail order. If that is incorrect please advise.

## 2017-09-05 ENCOUNTER — Ambulatory Visit (HOSPITAL_COMMUNITY)
Admission: RE | Admit: 2017-09-05 | Discharge: 2017-09-05 | Disposition: A | Discharge status: Home / Self Care | Payer: Medicare PPO | Source: Ambulatory Visit | Attending: Nephrology | Admitting: Nephrology

## 2017-09-05 DIAGNOSIS — D631 Anemia in chronic kidney disease: Secondary | ICD-10-CM | POA: Insufficient documentation

## 2017-09-05 DIAGNOSIS — N189 Chronic kidney disease, unspecified: Secondary | ICD-10-CM | POA: Diagnosis not present

## 2017-09-05 MED ORDER — SODIUM CHLORIDE 0.9 % IV SOLN
510.0000 mg | Freq: Once | INTRAVENOUS | Status: AC
  Administered 2017-09-05: 510 mg via INTRAVENOUS
  Filled 2017-09-05: qty 17

## 2017-09-05 MED ORDER — POLYSACCHARIDE IRON COMPLEX 150 MG PO CAPS: 150.0000 mg | ORAL_CAPSULE | Freq: Two times a day (BID) | ORAL | 3 refills | Status: DC

## 2017-09-05 NOTE — Telephone Encounter (Signed)
New rx for Ferrex [facility].  Please double check with family which place they wanted it and call and cancel the unnecessary one.

## 2017-09-05 NOTE — Addendum Note (Signed)
Addended by: Rodolph Bong on: 09/05/2017 04:25 PM   Modules accepted: Orders

## 2017-09-06 NOTE — Telephone Encounter (Signed)
Unable to contact Pt's daughter, phone is not in service. Based on original note, Rx to go to mail order. Will cancel local.

## 2017-09-06 NOTE — Telephone Encounter (Signed)
[facility] is closed. Will attempt callback during business hours.

## 2017-09-06 NOTE — Telephone Encounter (Signed)
Spoke with [facility]. They delivered Rx last night. They will remove refills.

## 2017-09-10 ENCOUNTER — Encounter: Payer: Self-pay | Admitting: Emergency Medicine

## 2017-09-10 ENCOUNTER — Emergency Department
Admission: EM | Admit: 2017-09-10 | Discharge: 2017-09-10 | Disposition: A | Discharge status: Home / Self Care | Payer: Medicare PPO | Source: Home / Self Care | Attending: Family Medicine | Admitting: Family Medicine

## 2017-09-10 DIAGNOSIS — R300 Dysuria: Secondary | ICD-10-CM | POA: Diagnosis not present

## 2017-09-10 LAB — POCT URINALYSIS DIP (MANUAL ENTRY)
BILIRUBIN UA: NEGATIVE
Glucose, UA: NEGATIVE mg/dL
Ketones, POC UA: NEGATIVE mg/dL
Nitrite, UA: NEGATIVE
Protein Ur, POC: 100 mg/dL — AB
Spec Grav, UA: 1.02 (ref 1.010–1.025)
Urobilinogen, UA: 0.2 E.U./dL
pH, UA: 6.5 (ref 5.0–8.0)

## 2017-09-10 MED ORDER — SULFAMETHOXAZOLE-TRIMETHOPRIM 800-160 MG PO TABS: 1.0000 | ORAL_TABLET | Freq: Two times a day (BID) | ORAL | 0 refills | Status: AC

## 2017-09-10 NOTE — Discharge Instructions (Signed)
°  You may try over the counter medication called Azo to help with bladder spasms.  This medication can make your urine orange, which is normal.   Please follow up with family medicine later this week if not improving.

## 2017-09-10 NOTE — ED Triage Notes (Signed)
Patient presents to [facility] with her daughter with C/O frequency and dysuria times 5 days.

## 2017-09-10 NOTE — ED Provider Notes (Signed)
[facility]    CSN: [id number] Arrival date & time: 09/10/17  1636     History   Chief Complaint Chief Complaint  Patient presents with  . Urinary Tract Infection    HPI [name] is a 84 y.o. female.   HPI  [name] is a 84 y.o. female presenting to UC with with her daughter c/o dysuria and frequency for about 5 days.  Daughter was just informed of this symptoms today. Pt has had several UTIs in the past. Similar symptoms. Last UTI was about 6 months ago. She has not tried anything specific for these symptoms but she takes a daily cranberry tablet.  Denies fever, chills, n/v/d.    Past Medical History:  Diagnosis Date  . Acute pancreatitis   . Anemia   . Arthritis   . Atrial fibrillation (HCC)   . CAD (coronary artery disease)   . Carotid artery occlusion   . CHF (congestive heart failure) (HCC)   . Diabetes mellitus age 71  . DJD (degenerative joint disease)   . DVT (deep venous thrombosis) (HCC)   . GERD (gastroesophageal reflux disease)   . GI bleed   . Hyperlipidemia   . Hypertension   . Joint pain   . Mesenteric ischemia   . Peripheral vascular disease (HCC)   . Thyroid disease     Patient Active Problem List   Diagnosis Date Noted  . Proteinuria 04/06/2017  . Depression, major, single episode, moderate (HCC) 01/12/2017  . Insomnia 12/01/2016  . Anemia 11/17/2016  . CAD (coronary artery disease) 11/17/2016  . Hyperlipidemia 11/17/2016  . Hypertension 11/17/2016  . Aortic atherosclerosis (HCC) 11/03/2016  . CKD (chronic kidney disease) stage 3, GFR 30-59 ml/min (HCC) 04/14/2015  . Colonic constipation 04/14/2015  . Frequent UTI 04/01/2015  . Diabetes mellitus with renal complications (HCC) 03/03/2015  . Atrophic vaginitis 02/24/2015  . Rheumatoid arthritis (HCC) 02/18/2015  . DNR (do not resuscitate) 02/13/2015  . Occlusion and stenosis of carotid artery without mention of cerebral infarction 05/02/2013  . Mesenteric artery  insufficiency (HCC) 02/14/2013  . FEMORAL BRUIT 11/24/2009  . Hypothyroidism 06/26/2009  . CEREBROVASCULAR DISEASE 06/25/2009  . ATRIAL FIBRILLATION 05/19/2008  . CHRONIC DIASTOLIC HEART FAILURE 05/19/2008  . RENAL ARTERY STENOSIS 05/19/2008  . PERIPHERAL VASCULAR DISEASE 05/19/2008    Past Surgical History:  Procedure Laterality Date  . APPENDECTOMY    . CELIAC ARTERY STENT    . CORONARY ARTERY BYPASS GRAFT    . EMBOLECTOMY  2009   right leg  . FASCIOTOMY     right leg  . HIP FRACTURE SURGERY Left   . JOINT REPLACEMENT    . TOTAL KNEE ARTHROPLASTY     bilateral  . TUBAL LIGATION    . VISCERAL ANGIOGRAM N/A 05/31/2013   Procedure: MESSENTERIC [name];  Surgeon: [name], MD;  Location: [facility] CATH LAB;  Service: Cardiovascular;  Laterality: N/A;    OB History   None      Home Medications    Prior to Admission medications   Medication Sig Start Date End Date Taking? Authorizing Provider  AMBULATORY NON FORMULARY MEDICATION Please check INR weekly for 2 months. Send reports to Dr Denyse Amass fax [phone] 12/19/16   Rodolph Bong, MD  apixaban (ELIQUIS) 5 MG TABS tablet Take 1 tablet (5 mg total) by mouth 2 (two) times daily. Take in substitution of warfarin if affordable 05/05/17   Lewayne Bunting, MD  Calcium Carb-Cholecalciferol 600-800 MG-UNIT TABS  TAKE ONE TABLET BY MOUTH 2 TIMES A DAY 09/11/15   Rodolph Bong, MD  clonazePAM (KLONOPIN) 0.5 MG tablet Take 1 tablet (0.5 mg total) by mouth at bedtime. 05/24/17   Rodolph Bong, MD  conjugated estrogens (PREMARIN) vaginal cream Place 1 Applicatorful vaginally daily. 12/19/16   Rodolph Bong, MD  diltiazem (CARDIZEM CD) 180 MG 24 hr capsule Take 1 capsule (180 mg total) by mouth at bedtime. 05/24/17   Rodolph Bong, MD  Ergocalciferol (VITAMIN D2) 2000 units TABS Take 2,000 Units by mouth daily.     [provider]  febuxostat (ULORIC) 40 MG tablet Take 40 mg by mouth daily.    [provider]  folic acid  (FOLVITE) 1 MG tablet Take 1 tablet (1 mg total) by mouth daily. 05/24/17   Rodolph Bong, MD  glucose blood test strip Once daily E11.29 03/03/15   Rodolph Bong, MD  iron polysaccharides (FERREX 150) 150 MG capsule Take 1 capsule (150 mg total) by mouth 2 (two) times daily. 09/05/17   Rodolph Bong, MD  leflunomide (ARAVA) 10 MG tablet Take 1 tablet (10 mg total) by mouth at bedtime. 05/24/17   Rodolph Bong, MD  levothyroxine (SYNTHROID, LEVOTHROID) 50 MCG tablet Take 1 tablet (50 mcg total) by mouth daily. 05/24/17   Rodolph Bong, MD  metoprolol succinate (TOPROL-XL) 50 MG 24 hr tablet Take 1 tablet (50 mg total) by mouth daily. 05/24/17   Rodolph Bong, MD  mirtazapine (REMERON) 15 MG tablet TAKE 1/2 TABLET BY MOUTH AT BEDTIME (BOTTLES) 07/20/17   Rodolph Bong, MD  pravastatin (PRAVACHOL) 40 MG tablet Take 1 tablet (40 mg total) by mouth daily. 05/24/17   Rodolph Bong, MD  STOOL SOFTENER 100 MG capsule TAKE 1 CAPSULE BY MOUTH 2 TIMES A DAY (AM & PM) Patient taking differently: TAKE 100 MG BY MOUTH 2 TIMES A DAY (AM & PM) 09/11/15   Rodolph Bong, MD  sulfamethoxazole-trimethoprim (BACTRIM DS,SEPTRA DS) 800-160 MG tablet Take 1 tablet by mouth 2 (two) times daily for 7 days. 09/10/17 09/17/17  Lurene Shadow, PA-C  torsemide (DEMADEX) 100 MG tablet Take 0.5 tablets (50 mg total) by mouth 2 (two) times daily. 05/24/17   Rodolph Bong, MD  traMADol (ULTRAM) 50 MG tablet Take 1 tablet (50 mg total) by mouth 2 (two) times daily as needed. Please fax to [facility]. 06/27/17   Rodolph Bong, MD  traZODone (DESYREL) 50 MG tablet Take 0.5-1 tablets (25-50 mg total) by mouth at bedtime as needed for sleep. 12/01/16   Rodolph Bong, MD  triamcinolone cream (KENALOG) 0.5 % Apply 1 application topically 2 (two) times daily. To affected areas. 07/19/17   Rodolph Bong, MD  vitamin B-12 (CYANOCOBALAMIN) 1000 MCG tablet Take 1,000 mcg by mouth daily.    [provider]    Family History Family History  Problem  Relation Age of Onset  . Cancer Mother        ovarian  . Other Father        suicide  . Coronary artery disease Sister   . Other Sister        alzheimers  . Heart disease Sister        Before age 41  . Diabetes Sister   . Cancer Daughter        spinal tumor  . Diabetes Daughter   . Hypertension Daughter   . Diabetes Son   .  Hyperlipidemia Son   . Hypertension Son     Social History Social History   Tobacco Use  . Smoking status: Never Smoker  . Smokeless tobacco: Never Used  Substance Use Topics  . Alcohol use: No  . Drug use: No     Allergies   Cefuroxime; Fish allergy; Ciprofloxacin; Codeine; Sertraline hcl; and Cephalexin   Review of Systems Review of Systems  Constitutional: Negative for chills and fever.  Gastrointestinal: Negative for abdominal pain, diarrhea, nausea and vomiting.  Genitourinary: Positive for dysuria and frequency. Negative for flank pain, hematuria and urgency.  Musculoskeletal: Negative for back pain.     Physical Exam Triage Vital Signs ED Triage Vitals  Enc Vitals Group     BP 09/10/17 1723 (!) 160/84     Pulse Rate 09/10/17 1723 94     Resp 09/10/17 1723 16     Temp 09/10/17 1723 98.8 F (37.1 C)     Temp Source 09/10/17 1723 Oral     SpO2 09/10/17 1723 96 %     Weight 09/10/17 1724 149 lb 8 oz (67.8 kg)     Height 09/10/17 1724 5\' 2"  (1.575 m)     Head Circumference --      [facility] Flow --      Pain Score 09/10/17 1724 2     Pain Loc --      Pain Edu? --      Excl. in GC? --    No data found.  Updated Vital Signs BP (!) 160/84 (BP Location: Right Arm)   Pulse 94   Temp 98.8 F (37.1 C) (Oral)   Resp 16   Ht 5\' 2"  (1.575 m)   Wt 149 lb 8 oz (67.8 kg)   SpO2 96%   BMI 27.34 kg/m   Visual Acuity Right Eye Distance:   Left Eye Distance:   Bilateral Distance:    Right Eye Near:   Left Eye Near:    Bilateral Near:     Physical Exam  Constitutional: She is oriented to person, place, and time. She appears  well-developed and well-nourished. No distress.  HENT:  Head: Normocephalic and atraumatic.  Mouth/Throat: Oropharynx is clear and moist.  Eyes: EOM are normal.  Neck: Normal range of motion.  Cardiovascular: Normal rate and regular rhythm.  Pulmonary/Chest: Effort normal and breath sounds normal. No stridor. No respiratory distress. She has no wheezes. She has no rales.  Abdominal: Soft. She exhibits no distension. There is no tenderness. There is no CVA tenderness.  Musculoskeletal: Normal range of motion.  Neurological: She is alert and oriented to person, place, and time.  Skin: Skin is warm and dry. She is not diaphoretic.  Psychiatric: She has a normal mood and affect. Her behavior is normal.  Nursing note and vitals reviewed.    UC Treatments / Results  Labs (all labs ordered are listed, but only abnormal results are displayed) Labs Reviewed  POCT URINALYSIS DIP (MANUAL ENTRY) - Abnormal; Notable for the following components:      Result Value   Blood, UA trace-intact (*)    Protein Ur, POC =100 (*)    Leukocytes, UA Trace (*)    All other components within normal limits  URINE CULTURE    EKG None  Radiology No results found.  Procedures Procedures (including critical care time)  Medications Ordered in UC Medications - No data to display  Initial Impression / Assessment and Plan / UC Course  I have reviewed the triage  vital signs and the nursing notes.  Pertinent labs & imaging results that were available during my care of the patient were reviewed by me and considered in my medical decision making (see chart for details).     UA: not definitive for UTI Urine culture sent. Encouraged symptomatic treatment while culture pending, however, daughter insistent pt start antibiotics this evening. Pt allergic to cipro and keflex She has had Bactrim before for UTIs. Home care instructions provided below.   Final Clinical Impressions(s) / UC Diagnoses   Final  diagnoses:  Dysuria     Discharge Instructions      You may try over the counter medication called Azo to help with bladder spasms.  This medication can make your urine orange, which is normal.   Please follow up with family medicine later this week if not improving.     ED Prescriptions    Medication Sig Dispense Auth. Provider   sulfamethoxazole-trimethoprim (BACTRIM DS,SEPTRA DS) 800-160 MG tablet Take 1 tablet by mouth 2 (two) times daily for 7 days. 14 tablet Lurene Shadow, [place]     Controlled Substance Prescriptions [place] Controlled Substance Registry consulted? Not Applicable   Rolla Plate 09/10/17 5400

## 2017-09-10 NOTE — ED Notes (Signed)
Please contact Ms. Evenson daughter with urine culture results her cell number is [phone]

## 2017-09-12 ENCOUNTER — Telehealth: Payer: Self-pay

## 2017-09-12 LAB — URINE CULTURE
MICRO NUMBER:: 90663734
SPECIMEN QUALITY:: ADEQUATE

## 2017-09-12 NOTE — Telephone Encounter (Signed)
Spoke with patient's daughter.  Will start antibiotic, has been using AZO.

## 2017-09-13 ENCOUNTER — Telehealth: Payer: Self-pay

## 2017-09-13 NOTE — Telephone Encounter (Signed)
Patient daughter called and requested that a urinalysis be done on her on mother when she come in for her appointment next week. She stated that patient had labs done that showed a UTI so she wanted to repeat the test. Please advise. Kimari Coudriet,CMA

## 2017-09-13 NOTE — Telephone Encounter (Signed)
Will do.  We will check a urinalysis as well as urine culture

## 2017-09-20 ENCOUNTER — Ambulatory Visit: Payer: Medicare PPO | Admitting: Family Medicine

## 2017-09-21 ENCOUNTER — Encounter: Payer: Self-pay | Admitting: Family Medicine

## 2017-09-21 ENCOUNTER — Ambulatory Visit (INDEPENDENT_AMBULATORY_CARE_PROVIDER_SITE_OTHER): Payer: Medicare PPO | Admitting: Family Medicine

## 2017-09-21 VITALS — BP 142/62 | HR 67 | Wt 153.0 lb

## 2017-09-21 DIAGNOSIS — D649 Anemia, unspecified: Secondary | ICD-10-CM | POA: Diagnosis not present

## 2017-09-21 DIAGNOSIS — I5032 Chronic diastolic (congestive) heart failure: Secondary | ICD-10-CM

## 2017-09-21 DIAGNOSIS — E1122 Type 2 diabetes mellitus with diabetic chronic kidney disease: Secondary | ICD-10-CM

## 2017-09-21 DIAGNOSIS — Z1321 Encounter for screening for nutritional disorder: Secondary | ICD-10-CM | POA: Diagnosis not present

## 2017-09-21 DIAGNOSIS — N183 Chronic kidney disease, stage 3 unspecified: Secondary | ICD-10-CM

## 2017-09-21 DIAGNOSIS — E119 Type 2 diabetes mellitus without complications: Secondary | ICD-10-CM | POA: Diagnosis not present

## 2017-09-21 DIAGNOSIS — N390 Urinary tract infection, site not specified: Secondary | ICD-10-CM

## 2017-09-21 DIAGNOSIS — D539 Nutritional anemia, unspecified: Secondary | ICD-10-CM | POA: Diagnosis not present

## 2017-09-21 LAB — POCT URINALYSIS DIPSTICK
Bilirubin, UA: NEGATIVE
Glucose, UA: NEGATIVE
Ketones, UA: NEGATIVE
Nitrite, UA: NEGATIVE
PH UA: 7 (ref 5.0–8.0)
PROTEIN UA: POSITIVE — AB
RBC UA: NEGATIVE
Spec Grav, UA: 1.015 (ref 1.010–1.025)
UROBILINOGEN UA: 0.2 U/dL

## 2017-09-21 NOTE — Progress Notes (Signed)
[name] is a 84 y.o. female who presents to [facility] [name]: Primary Care Sports Medicine today for follow up UTI, Anemia, CKD.   [name] has a history of recurrent urinary tract infections.  She recently was found to have a urinary tract infection and was treated with sulfa antibiotics.  She notes that she is feeling better but is here for test of cure.  Additionally [name] has a history of significant anemia when last assessed by her nephrologist.  Iron studies did not show severe iron deficiency she was thought to have anemia of chronic disease.  She is not quite sure what is happening with this.  She notes if he feels fatigued at times but denies chest pain palpitations or shortness of breath or severe exertional dyspnea.  Additionally [name] has a history of CKD as noted by her follow-up appointment with nephrology.  She takes medications listed below and notes that most the time her blood pressure is well controlled.   ROS as above:  Exam:  BP (!) 142/62   Pulse 67   Wt 153 lb (69.4 kg)   BMI 27.98 kg/m  Gen: Well NAD HEENT: EOMI,  MMM Lungs: Normal work of breathing. CTABL Heart: Irreg no MRG Abd: NABS, Soft. Nondistended, Nontender Exts: Brisk capillary refill, warm and well perfused.   Lab and Radiology Results Results for orders placed or performed in visit on 09/21/17 (from the past 72 hour(s))  POCT Urinalysis Dipstick     Status: Abnormal   Collection Time: 09/21/17  2:23 PM  Result Value Ref Range   Color, UA yellow    Clarity, UA clear    Glucose, UA Negative Negative   Bilirubin, UA negative    Ketones, UA negative    Spec Grav, UA 1.015 1.010 - 1.025   Blood, UA negative    pH, UA 7.0 5.0 - 8.0   Protein, UA Positive (A) Negative   Urobilinogen, UA 0.2 0.2 or 1.0 E.U./dL   Nitrite, UA negative    Leukocytes, UA Trace (A) Negative   Appearance     Odor      No results found.   Assessment and Plan: 84 y.o. female with  Recurrent UTI: Urine culture pending for test of cure.  Anemia: Unclear etiology.  Hemoglobin was quite low at last check.  I can see orders for iron infusion in the EMR but patient does not think that she had an iron infusion.  Plan to recheck CBC iron studies reticulocyte and erythropoietin labs.  Additional recheck metabolic panel.  CKD: Stable at recent checks.  Plan to continue current regimen and comanagement with nephrology.  Will send a copy of this note as well as labs to [facility] as well as to her rheumatologist.  Recheck in 2 months.    Orders Placed This Encounter  Procedures  . Urine Culture  . CBC  . Fe+TIBC+Fer  . Retic  . Erythropoietin  . COMPLETE METABOLIC PANEL WITH GFR  . POCT Urinalysis Dipstick   No orders of the defined types were placed in this encounter.    Historical information moved to improve visibility of documentation.  Past Medical History:  Diagnosis Date  . Acute pancreatitis   . Anemia   . Arthritis   . Atrial fibrillation (HCC)   . CAD (coronary artery disease)   . Carotid artery occlusion   . CHF (congestive heart failure) (HCC)   . Diabetes mellitus age 83  .  DJD (degenerative joint disease)   . DVT (deep venous thrombosis) (HCC)   . GERD (gastroesophageal reflux disease)   . GI bleed   . Hyperlipidemia   . Hypertension   . Joint pain   . Mesenteric ischemia   . Peripheral vascular disease (HCC)   . Thyroid disease    Past Surgical History:  Procedure Laterality Date  . APPENDECTOMY    . CELIAC ARTERY STENT    . CORONARY ARTERY BYPASS GRAFT    . EMBOLECTOMY  2009   right leg  . FASCIOTOMY     right leg  . HIP FRACTURE SURGERY Left   . JOINT REPLACEMENT    . TOTAL KNEE ARTHROPLASTY     bilateral  . TUBAL LIGATION    . VISCERAL ANGIOGRAM N/A 05/31/2013   Procedure: MESSENTERIC [name];  Surgeon: [name], MD;  Location: [facility]  CATH LAB;  Service: Cardiovascular;  Laterality: N/A;   Social History   Tobacco Use  . Smoking status: Never Smoker  . Smokeless tobacco: Never Used  Substance Use Topics  . Alcohol use: No   family history includes Cancer in her daughter and mother; Coronary artery disease in her sister; Diabetes in her daughter, sister, and son; Heart disease in her sister; Hyperlipidemia in her son; Hypertension in her daughter and son; Other in her father and sister.  Medications: Current Outpatient Medications  Medication Sig Dispense Refill  . AMBULATORY NON FORMULARY MEDICATION Please check INR weekly for 2 months. Send reports to Dr Denyse Amass fax [phone] 1 each 0  . apixaban (ELIQUIS) 5 MG TABS tablet Take 1 tablet (5 mg total) by mouth 2 (two) times daily. Take in substitution of warfarin if affordable 180 tablet 1  . Calcium Carb-Cholecalciferol 600-800 MG-UNIT TABS TAKE ONE TABLET BY MOUTH 2 TIMES A DAY 60 tablet 6  . clonazePAM (KLONOPIN) 0.5 MG tablet Take 1 tablet (0.5 mg total) by mouth at bedtime. 90 tablet 1  . conjugated estrogens (PREMARIN) vaginal cream Place 1 Applicatorful vaginally daily. 42.5 g 12  . diltiazem (CARDIZEM CD) 180 MG 24 hr capsule Take 1 capsule (180 mg total) by mouth at bedtime. 90 capsule 3  . Ergocalciferol (VITAMIN D2) 2000 units TABS Take 2,000 Units by mouth daily.     . febuxostat (ULORIC) 40 MG tablet Take 40 mg by mouth daily.    . folic acid (FOLVITE) 1 MG tablet Take 1 tablet (1 mg total) by mouth daily. 90 tablet 3  . glucose blood test strip Once daily E11.29 100 each 12  . iron polysaccharides (FERREX 150) 150 MG capsule Take 1 capsule (150 mg total) by mouth 2 (two) times daily. 180 capsule 3  . leflunomide (ARAVA) 10 MG tablet Take 1 tablet (10 mg total) by mouth at bedtime. 90 tablet 1  . levothyroxine (SYNTHROID, LEVOTHROID) 50 MCG tablet Take 1 tablet (50 mcg total) by mouth daily. 90 tablet 1  . metoprolol succinate (TOPROL-XL) 50 MG 24 hr  tablet Take 1 tablet (50 mg total) by mouth daily. 90 tablet 3  . mirtazapine (REMERON) 15 MG tablet TAKE 1/2 TABLET BY MOUTH AT BEDTIME (BOTTLES) 15 tablet 11  . pravastatin (PRAVACHOL) 40 MG tablet Take 1 tablet (40 mg total) by mouth daily. 90 tablet 1  . STOOL SOFTENER 100 MG capsule TAKE 1 CAPSULE BY MOUTH 2 TIMES A DAY (AM & PM) (Patient taking differently: TAKE 100 MG BY MOUTH 2 TIMES A DAY (AM & PM)) 56 capsule 0  .  torsemide (DEMADEX) 100 MG tablet Take 0.5 tablets (50 mg total) by mouth 2 (two) times daily. 180 tablet 1  . traMADol (ULTRAM) 50 MG tablet Take 1 tablet (50 mg total) by mouth 2 (two) times daily as needed. Please fax to [facility]. 180 tablet 1  . traZODone (DESYREL) 50 MG tablet Take 0.5-1 tablets (25-50 mg total) by mouth at bedtime as needed for sleep. 90 tablet 0  . triamcinolone cream (KENALOG) 0.5 % Apply 1 application topically 2 (two) times daily. To affected areas. 30 g 3  . vitamin B-12 (CYANOCOBALAMIN) 1000 MCG tablet Take 1,000 mcg by mouth daily.     No current facility-administered medications for this visit.    Allergies  Allergen Reactions  . Cefuroxime Anaphylaxis and Other (See Comments)    Throat swelling  . Fish Allergy Shortness Of Breath    Pt said she has throat swelling   . Ciprofloxacin Rash  . Codeine Other (See Comments)    Jittery Jittery  . Sertraline Hcl Rash  . Cephalexin Rash    Health Maintenance Health Maintenance  Topic Date Due  . FOOT EXAM  07/26/2017  . INFLUENZA VACCINE  11/09/2017  . HEMOGLOBIN A1C  01/18/2018  . OPHTHALMOLOGY EXAM  02/23/2018  . TETANUS/TDAP  12/28/2025  . PNA vac Low Risk Adult  Completed    Discussed warning signs or symptoms. Please see discharge instructions. Patient expresses understanding.

## 2017-09-21 NOTE — Patient Instructions (Signed)
Thank you for coming in today. Get labs today.  Recheck in 2 months.  Return sooner if needed.  I will send notes and results to the kidney doctors.    Anemia Anemia is a condition in which you do not have enough red blood cells or hemoglobin. Hemoglobin is a substance in red blood cells that carries oxygen. When you do not have enough red blood cells or hemoglobin (are anemic), your body cannot get enough oxygen and your organs may not work properly. As a result, you may feel very tired or have other problems. What are the causes? Common causes of anemia include:  Excessive bleeding. Anemia can be caused by excessive bleeding inside or outside the body, including bleeding from the intestine or from periods in women.  Poor nutrition.  Long-lasting (chronic) kidney, thyroid, and liver disease.  Bone marrow disorders.  Cancer and treatments for cancer.  HIV (human immunodeficiency virus) and AIDS (acquired immunodeficiency syndrome).  Treatments for HIV and AIDS.  Spleen problems.  Blood disorders.  Infections, medicines, and autoimmune disorders that destroy red blood cells.  What are the signs or symptoms? Symptoms of this condition include:  Minor weakness.  Dizziness.  Headache.  Feeling heartbeats that are irregular or faster than normal (palpitations).  Shortness of breath, especially with exercise.  Paleness.  Cold sensitivity.  Indigestion.  Nausea.  Difficulty sleeping.  Difficulty concentrating.  Symptoms may occur suddenly or develop slowly. If your anemia is mild, you may not have symptoms. How is this diagnosed? This condition is diagnosed based on:  Blood tests.  Your medical history.  A physical exam.  Bone marrow biopsy.  Your health care provider may also check your stool (feces) for blood and may do additional testing to look for the cause of your bleeding. You may also have other tests, including:  Imaging tests, such as a CT  scan or MRI.  Endoscopy.  Colonoscopy.  How is this treated? Treatment for this condition depends on the cause. If you continue to lose a lot of blood, you may need to be treated at a hospital. Treatment may include:  Taking supplements of iron, vitamin I77, or folic acid.  Taking a hormone medicine (erythropoietin) that can help to stimulate red blood cell growth.  Having a blood transfusion. This may be needed if you lose a lot of blood.  Making changes to your diet.  Having surgery to remove your spleen.  Follow these instructions at home:  Take over-the-counter and prescription medicines only as told by your health care provider.  Take supplements only as told by your health care provider.  Follow any diet instructions that you were given.  Keep all follow-up visits as told by your health care provider. This is important. Contact a health care provider if:  You develop new bleeding anywhere in the body. Get help right away if:  You are very weak.  You are short of breath.  You have pain in your abdomen or chest.  You are dizzy or feel faint.  You have trouble concentrating.  You have bloody or black, tarry stools.  You vomit repeatedly or you vomit up blood. Summary  Anemia is a condition in which you do not have enough red blood cells or enough of a substance in your red blood cells that carries oxygen (hemoglobin).  Symptoms may occur suddenly or develop slowly.  If your anemia is mild, you may not have symptoms.  This condition is diagnosed with blood tests as  well as a medical history and physical exam. Other tests may be needed.  Treatment for this condition depends on the cause of the anemia. This information is not intended to replace advice given to you by your health care provider. Make sure you discuss any questions you have with your health care provider. Document Released: 05/05/2004 Document Revised: 04/29/2016 Document Reviewed:  04/29/2016 Elsevier Interactive Patient Education  [employer].

## 2017-09-22 DIAGNOSIS — D539 Nutritional anemia, unspecified: Secondary | ICD-10-CM | POA: Insufficient documentation

## 2017-09-22 NOTE — Addendum Note (Signed)
Addended by: Rodolph Bong on: 09/22/2017 07:29 AM   Modules accepted: Orders

## 2017-09-23 LAB — URINE CULTURE
MICRO NUMBER: 90710786
SPECIMEN QUALITY:: ADEQUATE

## 2017-09-25 LAB — RETICULOCYTES
ABS RETIC: 68380 {cells}/uL (ref 20000–8000)
RETIC CT PCT: 2.6 %

## 2017-09-25 LAB — CBC
HEMATOCRIT: 27.3 % — AB (ref 35.0–45.0)
HEMOGLOBIN: 9.1 g/dL — AB (ref 11.7–15.5)
MCH: 34.1 pg — AB (ref 27.0–33.0)
MCHC: 33.3 g/dL (ref 32.0–36.0)
MCV: 102.2 fL — AB (ref 80.0–100.0)
MPV: 10.1 fL (ref 7.5–12.5)
PLATELETS: 270 10*3/uL (ref 140–400)
RBC: 2.67 10*6/uL — AB (ref 3.80–5.10)
RDW: 18.3 % — ABNORMAL HIGH (ref 11.0–15.0)
WBC: 5.4 10*3/uL (ref 3.8–10.8)

## 2017-09-25 LAB — TEST AUTHORIZATION

## 2017-09-25 LAB — B12 AND FOLATE PANEL
Folate: >24 ng/mL
VITAMIN B 12: 1684 pg/mL — AB (ref 200–1100)

## 2017-09-25 LAB — METHYLMALONIC ACID, SERUM: METHYLMALONIC ACID, QUANT: 233 nmol/L (ref 87–318)

## 2017-09-25 LAB — COMPLETE METABOLIC PANEL WITH GFR
AG Ratio: 1.3 (calc) (ref 1.0–2.5)
ALBUMIN MSPROF: 3.6 g/dL (ref 3.6–5.1)
ALKALINE PHOSPHATASE (APISO): 111 U/L (ref 33–130)
ALT: 17 U/L (ref 6–29)
AST: 24 U/L (ref 10–35)
BUN / CREAT RATIO: 13 (calc) (ref 6–22)
BUN: 20 mg/dL (ref 7–25)
CO2: 31 mmol/L (ref 20–32)
Calcium: 9.5 mg/dL (ref 8.6–10.4)
Chloride: 101 mmol/L (ref 98–110)
Creat: 1.49 mg/dL — ABNORMAL HIGH (ref 0.60–0.88)
GFR, Est African American: 37 mL/min/{1.73_m2} — ABNORMAL LOW (ref >=60)
GFR, Est Non African American: 32 mL/min/{1.73_m2} — ABNORMAL LOW (ref >=60)
GLUCOSE: 146 mg/dL — AB (ref 65–99)
Globulin: 2.8 g/dL (calc) (ref 1.9–3.7)
Potassium: 4 mmol/L (ref 3.5–5.3)
Sodium: 139 mmol/L (ref 135–146)
Total Bilirubin: 0.6 mg/dL (ref 0.2–1.2)
Total Protein: 6.4 g/dL (ref 6.1–8.1)

## 2017-09-25 LAB — ERYTHROPOIETIN: Erythropoietin: 57 m[IU]/mL — ABNORMAL HIGH (ref 2.6–18.5)

## 2017-09-25 LAB — IRON,TIBC AND FERRITIN PANEL
%SAT: 18 % (ref 11–50)
FERRITIN: 759 ng/mL — AB (ref 20–288)
Iron: 43 ug/dL — ABNORMAL LOW (ref 45–160)
TIBC: 240 mcg/dL (calc) — ABNORMAL LOW (ref 250–450)

## 2017-09-25 MED ORDER — FOSFOMYCIN TROMETHAMINE 3 G PO PACK: 3.0000 g | PACK | Freq: Once | ORAL | 0 refills | Status: AC

## 2017-09-25 NOTE — Addendum Note (Signed)
Addended by: Rodolph Bong on: 09/25/2017 07:13 AM   Modules accepted: Orders

## 2017-09-26 DIAGNOSIS — L97421 Non-pressure chronic ulcer of left heel and midfoot limited to breakdown of skin: Secondary | ICD-10-CM | POA: Diagnosis not present

## 2017-09-26 DIAGNOSIS — E11621 Type 2 diabetes mellitus with foot ulcer: Secondary | ICD-10-CM | POA: Diagnosis not present

## 2017-09-26 DIAGNOSIS — E1142 Type 2 diabetes mellitus with diabetic polyneuropathy: Secondary | ICD-10-CM | POA: Diagnosis not present

## 2017-10-16 ENCOUNTER — Telehealth: Payer: Self-pay

## 2017-10-16 NOTE — Telephone Encounter (Signed)
Will call then 12:15 Wednesday

## 2017-10-16 NOTE — Telephone Encounter (Signed)
Patient daughter [name] called and would like for you to give her a call at [phone] on Wednesday July 10,2019 at 12:15 pm to discuss her mothers care. Please advise. Rhonda Cunningham,CMA

## 2017-10-17 ENCOUNTER — Encounter: Payer: Self-pay | Admitting: Family Medicine

## 2017-10-17 ENCOUNTER — Ambulatory Visit (INDEPENDENT_AMBULATORY_CARE_PROVIDER_SITE_OTHER): Payer: Medicare PPO | Admitting: Family Medicine

## 2017-10-17 VITALS — BP 150/70 | HR 76 | Wt 155.0 lb

## 2017-10-17 DIAGNOSIS — N390 Urinary tract infection, site not specified: Secondary | ICD-10-CM | POA: Diagnosis not present

## 2017-10-17 DIAGNOSIS — N183 Chronic kidney disease, stage 3 unspecified: Secondary | ICD-10-CM

## 2017-10-17 DIAGNOSIS — R809 Proteinuria, unspecified: Secondary | ICD-10-CM | POA: Diagnosis not present

## 2017-10-17 DIAGNOSIS — D539 Nutritional anemia, unspecified: Secondary | ICD-10-CM

## 2017-10-17 DIAGNOSIS — M069 Rheumatoid arthritis, unspecified: Secondary | ICD-10-CM

## 2017-10-17 LAB — POCT URINALYSIS DIPSTICK
BILIRUBIN UA: NEGATIVE
GLUCOSE UA: NEGATIVE
KETONES UA: NEGATIVE
Leukocytes, UA: NEGATIVE
Nitrite, UA: NEGATIVE
Protein, UA: POSITIVE — AB
RBC UA: NEGATIVE
SPEC GRAV UA: 1.025 (ref 1.010–1.025)
Urobilinogen, UA: 0.2 E.U./dL
pH, UA: 6 (ref 5.0–8.0)

## 2017-10-17 NOTE — Progress Notes (Signed)
[name] is a 84 y.o. female who presents to [facility] [name]: Primary Care Sports Medicine today for hypertension, CKD, atrial fibrillation, frequent UTI, anemia.  [name] was seen last month for frequent UTI.  She was treated and is currently asymptomatic.  She denies any urinary frequency urgency or dysuria.  She feels quite well with no abdominal pain.  Additionally she has CKD managed with nephrology.  Last creatinine was 1.49 with a GFR of 32.  This is slightly above baseline of about 1.3-1.2.  She denies any significant leg swelling or shortness of breath.  She is not changing her medications.  She takes medications listed below.  Additionally she was found to have anemia.  After work-up she had normal iron stores with a percent saturation of 18 and ferritin elevated at 759.  TIBC was depressed at 240.  Reticulocyte percentage was normal at 2.6 and erythropoietin was elevated at 57.  Her MCV was elevated at greater than 100 and B12 and methylmalonic acid were obtained and normal.  She denies significant fatigue.  She takes medications listed below.    Rheumatoid arthritis: Managed with rheumatology.  Patient is due for follow-up appointment in the near future.  She thinks she has one scheduled for next week.  She thinks labs will be obtained at the next visit.  She notes that she has occasional pain will use Tylenol but typically controls her pain pretty well.  Additionally she has atrial fibrillation which is rate managed with medication listed below.  She takes Eliquis for anticoagulation.  She denies lightheadedness or dizziness.   ROS as above:  Exam:  BP (!) 150/70   Pulse 76   Wt 155 lb (70.3 kg)   BMI 28.35 kg/m   Vitals:   10/17/17 1406 10/17/17 1412  BP: (!) 130/41 (!) 150/70  Pulse: (!) 44 76  Weight: 155 lb (70.3 kg)     Gen: Well NAD HEENT: EOMI,  MMM Lungs: Normal work of  breathing. CTABL Heart: Irregular irregular heart rate about 50 to 60 bpm resting during exam today.  No MRG Abd: NABS, Soft. Nondistended, Nontender Exts: Brisk capillary refill, warm and well perfused.  Significant ulnar deviation of MCPs with mild synovitis bilaterally.  Lab and Radiology Results Component     Latest Ref Rng & Units 09/21/2017  Glucose     65 - 99 mg/dL 256 (H)  BUN     7 - 25 mg/dL 20  Creatinine     3.89 - 0.88 mg/dL 3.73 (H)  GFR, Est Non African American     > OR = 60 mL/min/1.32m2 32 (L)  GFR, Est African American     > OR = 60 mL/min/1.25m2 37 (L)  BUN/Creatinine Ratio     6 - 22 (calc) 13  Sodium     135 - 146 mmol/L 139  Potassium     3.5 - 5.3 mmol/L 4.0  Chloride     98 - 110 mmol/L 101  CO2     20 - 32 mmol/L 31  Calcium     8.6 - 10.4 mg/dL 9.5  Total Protein     6.1 - 8.1 g/dL 6.4  Albumin MSPROF     3.6 - 5.1 g/dL 3.6  Globulin     1.9 - 3.7 g/dL (calc) 2.8  AG Ratio     1.0 - 2.5 (calc) 1.3  Total Bilirubin     0.2 - 1.2 mg/dL 0.6  Alkaline phosphatase (APISO)     33 - 130 U/L 111  AST     10 - 35 U/L 24  ALT     6 - 29 U/L 17  WBC     3.8 - 10.8 Thousand/uL 5.4  RBC     3.80 - 5.10 Million/uL 2.67 (L)  Hemoglobin     11.7 - 15.5 g/dL 9.1 (L)  HCT     66.0 - 45.0 % 27.3 (L)  MCV     80.0 - 100.0 fL 102.2 (H)  MCH     27.0 - 33.0 pg 34.1 (H)  MCHC     32.0 - 36.0 g/dL 63.0  RDW     16.0 - 10.9 % 18.3 (H)  Platelets     140 - 400 Thousand/uL 270  MPV     7.5 - 12.5 fL 10.1  Iron     45 - 160 mcg/dL 43 (L)  TIBC     323 - 450 mcg/dL (calc) 557 (L)  %SAT     11 - 50 % (calc) 18  Ferritin     20 - 288 ng/mL 759 (H)  Retic Ct Pct     % 2.6  ABS Retic     20,000 - 8,000 cells/uL 68,380  Vitamin B12     200 - 1,100 pg/mL 1,684 (H)  Folate     ng/mL >24.0  Erythropoietin     2.6 - 18.5 mIU/mL 57.0 (H)  Methylmalonic Acid, Quant     87 - 318 nmol/L 233    Results for orders placed or performed in visit on  10/17/17 (from the past 24 hour(s))  POCT Urinalysis Dipstick     Status: Abnormal   Collection Time: 10/17/17  2:30 PM  Result Value Ref Range   Color, UA yellow    Clarity, UA clear    Glucose, UA Negative Negative   Bilirubin, UA negative    Ketones, UA negative    Spec Grav, UA 1.025 1.010 - 1.025   Blood, UA negative    pH, UA 6.0 5.0 - 8.0   Protein, UA Positive (A) Negative   Urobilinogen, UA 0.2 0.2 or 1.0 E.U./dL   Nitrite, UA negative    Leukocytes, UA Negative Negative   Appearance     Odor        Assessment and Plan: 84 y.o. female with  Frequent UTIs.  Point-of-care urinalysis positive for protein but otherwise negative.  Culture pending.  [name] is asymptomatic.  Plan for watchful waiting and recheck in 2 months.  Chronic kidney disease: Slight increased creatinine at last check.  Patient is due for labs that rheumatology next week.  Like to avoid duplication of work and will recheck patient in 2 months.  Check labs at that visit if not already checked recently.  Rheumatoid arthritis: Reasonably well controlled.  Continue current regimen.  Macrocytic anemia: B12 and folate are within normal limits.  Patient has reasonable iron stores as well as normal reticulocyte count and erythropoietin in the reference range for labs.  I suspect the anemia is likely anemia of chronic disease.  Plan for watchful waiting in the near future.  Recheck in 2 months.    Orders Placed This Encounter  Procedures  . Urine Culture  . POCT Urinalysis Dipstick   No orders of the defined types were placed in this encounter.    Historical information moved to improve visibility of documentation.  Past Medical History:  Diagnosis Date  .  Acute pancreatitis   . Anemia   . Arthritis   . Atrial fibrillation (HCC)   . CAD (coronary artery disease)   . Carotid artery occlusion   . CHF (congestive heart failure) (HCC)   . Diabetes mellitus age 26  . DJD (degenerative joint disease)    . DVT (deep venous thrombosis) (HCC)   . GERD (gastroesophageal reflux disease)   . GI bleed   . Hyperlipidemia   . Hypertension   . Joint pain   . Mesenteric ischemia   . Peripheral vascular disease (HCC)   . Thyroid disease    Past Surgical History:  Procedure Laterality Date  . APPENDECTOMY    . CELIAC ARTERY STENT    . CORONARY ARTERY BYPASS GRAFT    . EMBOLECTOMY  2009   right leg  . FASCIOTOMY     right leg  . HIP FRACTURE SURGERY Left   . JOINT REPLACEMENT    . TOTAL KNEE ARTHROPLASTY     bilateral  . TUBAL LIGATION    . VISCERAL ANGIOGRAM N/A 05/31/2013   Procedure: MESSENTERIC [name];  Surgeon: [name], MD;  Location: [facility] CATH LAB;  Service: Cardiovascular;  Laterality: N/A;   Social History   Tobacco Use  . Smoking status: Never Smoker  . Smokeless tobacco: Never Used  Substance Use Topics  . Alcohol use: No   family history includes Cancer in her daughter and mother; Coronary artery disease in her sister; Diabetes in her daughter, sister, and son; Heart disease in her sister; Hyperlipidemia in her son; Hypertension in her daughter and son; Other in her father and sister.  Medications: Current Outpatient Medications  Medication Sig Dispense Refill  . AMBULATORY NON FORMULARY MEDICATION Please check INR weekly for 2 months. Send reports to Dr Denyse Amass fax [phone] 1 each 0  . apixaban (ELIQUIS) 5 MG TABS tablet Take 1 tablet (5 mg total) by mouth 2 (two) times daily. Take in substitution of warfarin if affordable 180 tablet 1  . Calcium Carb-Cholecalciferol 600-800 MG-UNIT TABS TAKE ONE TABLET BY MOUTH 2 TIMES A DAY 60 tablet 6  . clonazePAM (KLONOPIN) 0.5 MG tablet Take 1 tablet (0.5 mg total) by mouth at bedtime. 90 tablet 1  . conjugated estrogens (PREMARIN) vaginal cream Place 1 Applicatorful vaginally daily. 42.5 g 12  . diltiazem (CARDIZEM CD) 180 MG 24 hr capsule Take 1 capsule (180 mg total) by mouth at bedtime. 90 capsule 3  .  Ergocalciferol (VITAMIN D2) 2000 units TABS Take 2,000 Units by mouth daily.     . febuxostat (ULORIC) 40 MG tablet Take 40 mg by mouth daily.    . folic acid (FOLVITE) 1 MG tablet Take 1 tablet (1 mg total) by mouth daily. 90 tablet 3  . glucose blood test strip Once daily E11.29 100 each 12  . iron polysaccharides (FERREX 150) 150 MG capsule Take 1 capsule (150 mg total) by mouth 2 (two) times daily. 180 capsule 3  . leflunomide (ARAVA) 10 MG tablet Take 1 tablet (10 mg total) by mouth at bedtime. 90 tablet 1  . levothyroxine (SYNTHROID, LEVOTHROID) 50 MCG tablet Take 1 tablet (50 mcg total) by mouth daily. 90 tablet 1  . metoprolol succinate (TOPROL-XL) 50 MG 24 hr tablet Take 1 tablet (50 mg total) by mouth daily. 90 tablet 3  . mirtazapine (REMERON) 15 MG tablet TAKE 1/2 TABLET BY MOUTH AT BEDTIME (BOTTLES) 15 tablet 11  . pravastatin (PRAVACHOL) 40 MG tablet Take 1 tablet (40  mg total) by mouth daily. 90 tablet 1  . STOOL SOFTENER 100 MG capsule TAKE 1 CAPSULE BY MOUTH 2 TIMES A DAY (AM & PM) (Patient taking differently: TAKE 100 MG BY MOUTH 2 TIMES A DAY (AM & PM)) 56 capsule 0  . torsemide (DEMADEX) 100 MG tablet Take 0.5 tablets (50 mg total) by mouth 2 (two) times daily. 180 tablet 1  . traMADol (ULTRAM) 50 MG tablet Take 1 tablet (50 mg total) by mouth 2 (two) times daily as needed. Please fax to [facility]. 180 tablet 1  . traZODone (DESYREL) 50 MG tablet Take 0.5-1 tablets (25-50 mg total) by mouth at bedtime as needed for sleep. 90 tablet 0  . triamcinolone cream (KENALOG) 0.5 % Apply 1 application topically 2 (two) times daily. To affected areas. 30 g 3  . vitamin B-12 (CYANOCOBALAMIN) 1000 MCG tablet Take 1,000 mcg by mouth daily.     No current facility-administered medications for this visit.    Allergies  Allergen Reactions  . Cefuroxime Anaphylaxis and Other (See Comments)    Throat swelling  . Fish Allergy Shortness Of Breath    Pt said she has throat swelling   .  Ciprofloxacin Rash  . Codeine Other (See Comments)    Jittery Jittery  . Sertraline Hcl Rash  . Cephalexin Rash     Discussed warning signs or symptoms. Please see discharge instructions. Patient expresses understanding.  CC: [name], MD Referring Physician Rheumatology 11/02/2016 End  11/02/16  Phone: [phone]; Fax: [phone]     [name], MD Consulting Physician Nephrology 09/21/2017 End  09/21/17  Phone: [phone]; Fax: [phone]

## 2017-10-17 NOTE — Patient Instructions (Signed)
Thank you for coming in today. Recheck in 2 months.  Return sooner if needed.  I will send copy of notes to both the kidney and rheumatology doctors.  Tylenol if ok for pain control.

## 2017-10-18 ENCOUNTER — Telehealth: Payer: Self-pay | Admitting: Family Medicine

## 2017-10-18 DIAGNOSIS — N189 Chronic kidney disease, unspecified: Secondary | ICD-10-CM | POA: Diagnosis not present

## 2017-10-18 MED ORDER — FERROUS BISGLYCINATE CHELATE 15 MG PO TABS
1.0000 | ORAL_TABLET | Freq: Two times a day (BID) | ORAL | 12 refills | Status: DC
Start: 2017-10-18 — End: 2017-10-18

## 2017-10-18 MED ORDER — FERROUS BISGLYCINATE CHELATE 15 MG PO TABS: 1.0000 | ORAL_TABLET | Freq: Two times a day (BID) | ORAL | 12 refills | Status: DC

## 2017-10-18 NOTE — Telephone Encounter (Signed)
I called Archie Patten about Anouk.  1) Urinary Incontinent is a newer issue over the last month or so.   2) Nausea with Ferrex iron pill. Plan to transition to Iron Bisglycinate

## 2017-10-19 LAB — URINE CULTURE
MICRO NUMBER:: 90812636
SPECIMEN QUALITY: ADEQUATE

## 2017-10-20 ENCOUNTER — Telehealth: Payer: Self-pay

## 2017-10-20 MED ORDER — FERROUS BISGLYCINATE CHELATE 15 MG PO TABS: 1.0000 | ORAL_TABLET | Freq: Two times a day (BID) | ORAL | 3 refills | Status: DC

## 2017-10-20 NOTE — Telephone Encounter (Signed)
Patient daughter called she could not get iron medication filled at [facility] so I sent it to [facility] pharmacy. Liylah Najarro,CMA

## 2017-10-20 NOTE — Telephone Encounter (Signed)
[name] wants to know if she should take her mom to see the urologist in [place] because of the UTI. Please advise.

## 2017-10-20 NOTE — Telephone Encounter (Signed)
I do not think that is necessary.  We can certainly get one set up here if needed. Let me know what patient wants to do.

## 2017-10-20 NOTE — Telephone Encounter (Signed)
Patient daughter called about the pharmacy not receiving Ferrous 15 mg. I faxed the confirmation over to [facility]. Rhonda Cunningham,CMA

## 2017-10-23 NOTE — Telephone Encounter (Signed)
Left message for a return call and advised of recommendations.

## 2017-10-24 DIAGNOSIS — M2550 Pain in unspecified joint: Secondary | ICD-10-CM | POA: Diagnosis not present

## 2017-10-24 DIAGNOSIS — E663 Overweight: Secondary | ICD-10-CM | POA: Diagnosis not present

## 2017-10-24 DIAGNOSIS — Z79899 Other long term (current) drug therapy: Secondary | ICD-10-CM | POA: Diagnosis not present

## 2017-10-24 DIAGNOSIS — M0579 Rheumatoid arthritis with rheumatoid factor of multiple sites without organ or systems involvement: Secondary | ICD-10-CM | POA: Diagnosis not present

## 2017-10-24 DIAGNOSIS — M1009 Idiopathic gout, multiple sites: Secondary | ICD-10-CM | POA: Diagnosis not present

## 2017-10-24 DIAGNOSIS — Z6827 Body mass index (BMI) 27.0-27.9, adult: Secondary | ICD-10-CM | POA: Diagnosis not present

## 2017-10-31 DIAGNOSIS — E1142 Type 2 diabetes mellitus with diabetic polyneuropathy: Secondary | ICD-10-CM | POA: Diagnosis not present

## 2017-10-31 DIAGNOSIS — L84 Corns and callosities: Secondary | ICD-10-CM | POA: Diagnosis not present

## 2017-10-31 DIAGNOSIS — L602 Onychogryphosis: Secondary | ICD-10-CM | POA: Diagnosis not present

## 2017-11-17 ENCOUNTER — Other Ambulatory Visit: Payer: Self-pay | Admitting: Cardiology

## 2017-11-21 ENCOUNTER — Ambulatory Visit (INDEPENDENT_AMBULATORY_CARE_PROVIDER_SITE_OTHER): Payer: Medicare PPO | Admitting: Family Medicine

## 2017-11-21 ENCOUNTER — Encounter: Payer: Self-pay | Admitting: Family Medicine

## 2017-11-21 ENCOUNTER — Telehealth: Payer: Self-pay | Admitting: Family Medicine

## 2017-11-21 VITALS — BP 125/71 | HR 67 | Wt 149.0 lb

## 2017-11-21 DIAGNOSIS — R8271 Bacteriuria: Secondary | ICD-10-CM

## 2017-11-21 DIAGNOSIS — M069 Rheumatoid arthritis, unspecified: Secondary | ICD-10-CM | POA: Diagnosis not present

## 2017-11-21 DIAGNOSIS — N183 Chronic kidney disease, stage 3 unspecified: Secondary | ICD-10-CM

## 2017-11-21 DIAGNOSIS — R300 Dysuria: Secondary | ICD-10-CM

## 2017-11-21 DIAGNOSIS — M0579 Rheumatoid arthritis with rheumatoid factor of multiple sites without organ or systems involvement: Secondary | ICD-10-CM | POA: Diagnosis not present

## 2017-11-21 DIAGNOSIS — R809 Proteinuria, unspecified: Secondary | ICD-10-CM | POA: Diagnosis not present

## 2017-11-21 DIAGNOSIS — E1122 Type 2 diabetes mellitus with diabetic chronic kidney disease: Secondary | ICD-10-CM | POA: Diagnosis not present

## 2017-11-21 LAB — POCT URINALYSIS DIPSTICK
BILIRUBIN UA: NEGATIVE
Blood, UA: NEGATIVE
GLUCOSE UA: NEGATIVE
Ketones, UA: NEGATIVE
Nitrite, UA: NEGATIVE
Protein, UA: POSITIVE — AB
Spec Grav, UA: 1.015 (ref 1.010–1.025)
UROBILINOGEN UA: 0.2 U/dL
pH, UA: 7 (ref 5.0–8.0)

## 2017-11-21 LAB — POCT GLYCOSYLATED HEMOGLOBIN (HGB A1C): HEMOGLOBIN A1C: 6.6 % — AB (ref 4.0–5.6)

## 2017-11-21 NOTE — Progress Notes (Signed)
PO

## 2017-11-21 NOTE — Telephone Encounter (Signed)
Talk to daughter Archie Patten today prior to [facility] visit.  Concern for recurrent UTI.  Question need for referral to urology.

## 2017-11-21 NOTE — Patient Instructions (Addendum)
Thank you for coming in today. We will continue to follow up with Dr Nickola Major and Dr Deterding. If they get labs make sure they send me results.  If you do not have symptoms of a UTI I do not think we should treat a bacteria in the urine.  But if you do develop symptoms let me know.

## 2017-11-21 NOTE — Progress Notes (Signed)
[name] is a 84 y.o. female who presents to [name]: Primary Care Sports Medicine today for follow up diabetes, bacturia, RA, CKD.   Onyx has diabetes managed with diet.  She avoids a higher carbohydrate diet.  She denies polyuria or polydipsia and feels well.  She does not check her blood sugar regularly.  Additionally she has a history of recurrent UTIs in the past most recently with asymptomatic bacteriuria.  Urine culture obtained at the last visit grew enterococcus 50-100 K colony-forming units pansensitive.  Fortunately she does not have urinary tract infection symptoms.  She denies dysuria polyuria urgency or frequency.  She has had symptoms like this in the past with previous UTIs.  In the past she was prescribed chronic trimethoprim for prophylaxis but has been off of this medication for some time now.  She feels fine without any abdominal or urinary symptoms.  Her daughter is quite concerned about the risk of developing future life-threatening infections.  Additionally Laneisha has rheumatoid arthritis managed with Dr. Nickola Major at [facility].  She takes medications listed below which control her pain reasonably well.  She has chronic kidney disease which is managed by Dr. Darrick Penna.  She think she has a follow-up appointment soon in the near future.  ROS as above:  Exam:  BP 125/71   Pulse 67   Wt 149 lb (67.6 kg)   BMI 27.25 kg/m  Gen: Well NAD HEENT: EOMI,  MMM Lungs: Normal work of breathing. CTABL Heart: RRR no MRG Abd: NABS, Soft. Nondistended, Nontender Exts: Brisk capillary refill, warm and well perfused.  MSK: Significant MCP synovitis and ulnar deviation bilaterally.  Patient ambulates with a walker.  Lab and Radiology Results Results for orders placed or performed in visit on 11/21/17 (from the past 72 hour(s))  POCT HgB A1C     Status:  Abnormal   Collection Time: 11/21/17  2:19 PM  Result Value Ref Range   Hemoglobin A1C 6.6 (A) 4.0 - 5.6 %   HbA1c POC (<> result, manual entry)     HbA1c, POC (prediabetic range)     HbA1c, POC (controlled diabetic range)    POCT Urinalysis Dipstick     Status: Abnormal   Collection Time: 11/21/17  2:20 PM  Result Value Ref Range   Color, UA YELLOW    Clarity, UA CLEAR    Glucose, UA Negative Negative   Bilirubin, UA NEGATIVE    Ketones, UA NEGATIVE    Spec Grav, UA 1.015 1.010 - 1.025   Blood, UA NEGATIVE    pH, UA 7.0 5.0 - 8.0   Protein, UA Positive (A) Negative   Urobilinogen, UA 0.2 0.2 or 1.0 E.U./dL   Nitrite, UA NEGATIVE    Leukocytes, UA Small (1+) (A) Negative   Appearance     Odor       Chemistry      Component Value Date/Time   NA 139 09/21/2017 1549   NA 138 05/19/2014   K 4.0 09/21/2017 1549   CL 101 09/21/2017 1549   CO2 31 09/21/2017 1549   BUN 20 09/21/2017 1549   BUN 37 (A) 05/19/2014   CREATININE 1.49 (H) 09/21/2017 1549   GLU 136 02/13/2015      Component Value Date/Time   CALCIUM 9.5 09/21/2017 1549   ALKPHOS 94 12/08/2016 1438   AST 24 09/21/2017 1549   ALT 17 09/21/2017 1549   BILITOT 0.6 09/21/2017 1549  Lab Results  Component Value Date   WBC 5.4 09/21/2017   HGB 9.1 (L) 09/21/2017   HCT 27.3 (L) 09/21/2017   MCV 102.2 (H) 09/21/2017   PLT 270 09/21/2017   Lab Results  Component Value Date   IRON 43 (L) 09/21/2017   TIBC 240 (L) 09/21/2017   FERRITIN 759 (H) 09/21/2017     Assessment and Plan: 84 y.o. female with  Asymptomatic bacteriuria: Patient continues to have asymptomatic bacteriuria.  Her daughter is very worried about this more so than [name] is herself.  [name] does not have symptoms today.  Plan for culture to see if there is any significant change and to help plan for treatment in the future if she becomes symptomatic as she has significant drug allergies that would make antibiotic choice difficult.  Will avoid  repeat urinalysis and cultures in the future if she is not symptomatic.  We will discuss this with her daughter as well in the future.  Chronic kidney disease: Renal function slightly worsened at last check.  Patient has a follow-up appointment soon with nephrology.  I am going to defer more labs right now as I expect she is probably getting get labs at her nephrologist office.  I sent recent labs to Dr. Darrick Penna at the last visit.  We will also send a copy of today's note.  Rheumatoid arthritis: Stable.  Managed with rheumatology.  Watchful waiting for specialized lab to rheumatology.  Labs results from recent labs sent to rheumatology at last visit.  Diabetes doing well continue current regimen  Chrystle is essentially stable. Recheck in 2 months.    Orders Placed This Encounter  Procedures  . Urine Culture  . POCT HgB A1C  . POCT Urinalysis Dipstick   No orders of the defined types were placed in this encounter.  CC:  Cheryl Lathe, MD Phone: [phone]; Fax: [phone]  Cheryl Jordan, MD Phone: [phone]; Fax: [phone]  Historical information moved to improve visibility of documentation.  Past Medical History:  Diagnosis Date  . Acute pancreatitis   . Anemia   . Arthritis   . Atrial fibrillation (HCC)   . CAD (coronary artery disease)   . Carotid artery occlusion   . CHF (congestive heart failure) (HCC)   . Diabetes mellitus age 46  . DJD (degenerative joint disease)   . DVT (deep venous thrombosis) (HCC)   . GERD (gastroesophageal reflux disease)   . GI bleed   . Hyperlipidemia   . Hypertension   . Joint pain   . Mesenteric ischemia   . Peripheral vascular disease (HCC)   . Thyroid disease    Past Surgical History:  Procedure Laterality Date  . APPENDECTOMY    . CELIAC ARTERY STENT    . CORONARY ARTERY BYPASS GRAFT    . EMBOLECTOMY  2009   right leg  . FASCIOTOMY     right leg  . HIP FRACTURE SURGERY Left   . JOINT REPLACEMENT    . TOTAL  KNEE ARTHROPLASTY     bilateral  . TUBAL LIGATION    . VISCERAL ANGIOGRAM N/A 05/31/2013   Procedure: MESSENTERIC [name];  Surgeon: [name], MD;  Location: [facility] CATH LAB;  Service: Cardiovascular;  Laterality: N/A;   Social History   Tobacco Use  . Smoking status: Never Smoker  . Smokeless tobacco: Never Used  Substance Use Topics  . Alcohol use: No   family history includes Cancer in her daughter and mother; Coronary artery disease in her sister;  Diabetes in her daughter, sister, and son; Heart disease in her sister; Hyperlipidemia in her son; Hypertension in her daughter and son; Other in her father and sister.  Medications: Current Outpatient Medications  Medication Sig Dispense Refill  . AMBULATORY NON FORMULARY MEDICATION Please check INR weekly for 2 months. Send reports to Dr Denyse Amass fax [phone] 1 each 0  . apixaban (ELIQUIS) 5 MG TABS tablet Take 1 tablet (5 mg total) by mouth 2 (two) times daily. 180 tablet 0  . Calcium Carb-Cholecalciferol 600-800 MG-UNIT TABS TAKE ONE TABLET BY MOUTH 2 TIMES A DAY 60 tablet 6  . clonazePAM (KLONOPIN) 0.5 MG tablet Take 1 tablet (0.5 mg total) by mouth at bedtime. 90 tablet 1  . conjugated estrogens (PREMARIN) vaginal cream Place 1 Applicatorful vaginally daily. 42.5 g 12  . diltiazem (CARDIZEM CD) 180 MG 24 hr capsule Take 1 capsule (180 mg total) by mouth at bedtime. 90 capsule 3  . Ergocalciferol (VITAMIN D2) 2000 units TABS Take 2,000 Units by mouth daily.     . febuxostat (ULORIC) 40 MG tablet Take 40 mg by mouth daily.    . Ferrous Bisglycinate Chelate 15 MG TABS Take 1 tablet by mouth 2 (two) times daily. 180 tablet 3  . folic acid (FOLVITE) 1 MG tablet Take 1 tablet (1 mg total) by mouth daily. 90 tablet 3  . glucose blood test strip Once daily E11.29 100 each 12  . leflunomide (ARAVA) 10 MG tablet Take 1 tablet (10 mg total) by mouth at bedtime. 90 tablet 1  . levothyroxine (SYNTHROID, LEVOTHROID) 50 MCG tablet Take 1  tablet (50 mcg total) by mouth daily. 90 tablet 1  . metoprolol succinate (TOPROL-XL) 50 MG 24 hr tablet Take 1 tablet (50 mg total) by mouth daily. 90 tablet 3  . mirtazapine (REMERON) 15 MG tablet TAKE 1/2 TABLET BY MOUTH AT BEDTIME (BOTTLES) 15 tablet 11  . pravastatin (PRAVACHOL) 40 MG tablet Take 1 tablet (40 mg total) by mouth daily. 90 tablet 1  . STOOL SOFTENER 100 MG capsule TAKE 1 CAPSULE BY MOUTH 2 TIMES A DAY (AM & PM) (Patient taking differently: TAKE 100 MG BY MOUTH 2 TIMES A DAY (AM & PM)) 56 capsule 0  . torsemide (DEMADEX) 100 MG tablet Take 0.5 tablets (50 mg total) by mouth 2 (two) times daily. 180 tablet 1  . traMADol (ULTRAM) 50 MG tablet Take 1 tablet (50 mg total) by mouth 2 (two) times daily as needed. Please fax to [facility] 180 tablet 1  . traZODone (DESYREL) 50 MG tablet Take 0.5-1 tablets (25-50 mg total) by mouth at bedtime as needed for sleep. 90 tablet 0  . triamcinolone cream (KENALOG) 0.5 % Apply 1 application topically 2 (two) times daily. To affected areas. 30 g 3  . vitamin B-12 (CYANOCOBALAMIN) 1000 MCG tablet Take 1,000 mcg by mouth daily.     No current facility-administered medications for this visit.    Allergies  Allergen Reactions  . Cefuroxime Anaphylaxis and Other (See Comments)    Throat swelling  . Fish Allergy Shortness Of Breath    Pt said she has throat swelling   . Ciprofloxacin Rash  . Codeine Other (See Comments)    Jittery Jittery  . Sertraline Hcl Rash  . Cephalexin Rash     Discussed warning signs or symptoms. Please see discharge instructions. Patient expresses understanding.

## 2017-11-22 ENCOUNTER — Other Ambulatory Visit: Payer: Self-pay | Admitting: Family Medicine

## 2017-11-24 LAB — URINE CULTURE
MICRO NUMBER: 90960053
SPECIMEN QUALITY: ADEQUATE

## 2017-11-28 DIAGNOSIS — L602 Onychogryphosis: Secondary | ICD-10-CM | POA: Diagnosis not present

## 2017-11-28 DIAGNOSIS — L84 Corns and callosities: Secondary | ICD-10-CM | POA: Diagnosis not present

## 2017-12-06 ENCOUNTER — Other Ambulatory Visit: Payer: Self-pay | Admitting: Family Medicine

## 2017-12-06 ENCOUNTER — Other Ambulatory Visit: Payer: Self-pay | Admitting: Cardiology

## 2017-12-07 ENCOUNTER — Other Ambulatory Visit: Payer: Self-pay | Admitting: Family Medicine

## 2017-12-07 DIAGNOSIS — L820 Inflamed seborrheic keratosis: Secondary | ICD-10-CM | POA: Diagnosis not present

## 2017-12-07 DIAGNOSIS — D485 Neoplasm of uncertain behavior of skin: Secondary | ICD-10-CM | POA: Diagnosis not present

## 2017-12-07 DIAGNOSIS — L281 Prurigo nodularis: Secondary | ICD-10-CM | POA: Diagnosis not present

## 2017-12-07 DIAGNOSIS — D692 Other nonthrombocytopenic purpura: Secondary | ICD-10-CM | POA: Diagnosis not present

## 2017-12-07 DIAGNOSIS — C44622 Squamous cell carcinoma of skin of right upper limb, including shoulder: Secondary | ICD-10-CM | POA: Diagnosis not present

## 2017-12-08 NOTE — Telephone Encounter (Signed)
Klonopin refill request from mail order pharmacy received and sent

## 2017-12-12 DIAGNOSIS — N2581 Secondary hyperparathyroidism of renal origin: Secondary | ICD-10-CM | POA: Diagnosis not present

## 2017-12-12 DIAGNOSIS — I5032 Chronic diastolic (congestive) heart failure: Secondary | ICD-10-CM | POA: Diagnosis not present

## 2017-12-12 DIAGNOSIS — I4891 Unspecified atrial fibrillation: Secondary | ICD-10-CM | POA: Diagnosis not present

## 2017-12-12 DIAGNOSIS — E1122 Type 2 diabetes mellitus with diabetic chronic kidney disease: Secondary | ICD-10-CM | POA: Diagnosis not present

## 2017-12-12 DIAGNOSIS — D631 Anemia in chronic kidney disease: Secondary | ICD-10-CM | POA: Diagnosis not present

## 2017-12-12 DIAGNOSIS — N184 Chronic kidney disease, stage 4 (severe): Secondary | ICD-10-CM | POA: Diagnosis not present

## 2017-12-12 DIAGNOSIS — I2510 Atherosclerotic heart disease of native coronary artery without angina pectoris: Secondary | ICD-10-CM | POA: Diagnosis not present

## 2017-12-12 DIAGNOSIS — I129 Hypertensive chronic kidney disease with stage 1 through stage 4 chronic kidney disease, or unspecified chronic kidney disease: Secondary | ICD-10-CM | POA: Diagnosis not present

## 2017-12-12 DIAGNOSIS — E785 Hyperlipidemia, unspecified: Secondary | ICD-10-CM | POA: Diagnosis not present

## 2017-12-12 LAB — BASIC METABOLIC PANEL
BUN: 21 (ref 4–21)
CREATININE: 1.2 — AB (ref 0.5–1.1)
Glucose: 140
Potassium: 4.3 (ref 3.4–5.3)
Sodium: 138 (ref 137–147)

## 2017-12-12 LAB — HEPATIC FUNCTION PANEL
ALT: 20 (ref 7–35)
AST: 32 (ref 13–35)
Alkaline Phosphatase: 141 — AB (ref 25–125)
Bilirubin, Total: 0.3

## 2017-12-13 ENCOUNTER — Telehealth: Payer: Self-pay

## 2017-12-13 NOTE — Telephone Encounter (Signed)
Pt's daughter called stating that her mother saw the nephrologist yesterday. Although pt did not have a UTI, she has been having a lot of issues with incontinence over the last couple months.   Wanting to know if Dr Denyse Amass would be comfortable writing something for pt, if she needs to see her urologist about this, or if she should schedule an appt with dr Denyse Amass.   Please advise

## 2017-12-14 NOTE — Telephone Encounter (Signed)
I am not sure if [name] wants me to provide an antibiotic or provide medicines for incontinence.  Can we please clarify.

## 2017-12-14 NOTE — Telephone Encounter (Signed)
I understood msg to be asking for medication for incontinence, but I left msg with dtr for clarification.

## 2017-12-15 ENCOUNTER — Encounter: Payer: Self-pay | Admitting: Family Medicine

## 2017-12-15 NOTE — Progress Notes (Signed)
Notes and labs reviewed from [facility].  Creatinine stable at 1.17 Labs will be sent abstract and note was sent to scan.

## 2017-12-15 NOTE — Telephone Encounter (Signed)
Pt's daughter called back stating she is not sure what might be needed. Nephrologist confirmed there is NO UTI but daughter wanting to know if incontinent med would be appropriate. No exact SX or issues noted in message other than "she still has that feeling".  Called and left pt's daughter a message stating that if pt has a urologist- she should be evaluated there and they can decide what medication would be best for pt. If pt does not want to be seen by urologist, advised her that she can call her and make appt with Dr Denyse Amass. [name] was under the impression that pt had appt on Tuesday but I stated in message that someone called and cancelled this appt, but we can get pt rescheduled if needed.   FYI to Dr Denyse Amass. No action required at this time.

## 2017-12-18 ENCOUNTER — Encounter: Payer: Self-pay | Admitting: Family Medicine

## 2017-12-18 ENCOUNTER — Ambulatory Visit: Payer: Medicare PPO | Admitting: Family Medicine

## 2017-12-19 ENCOUNTER — Ambulatory Visit: Payer: Medicare PPO | Admitting: Family Medicine

## 2018-01-02 ENCOUNTER — Other Ambulatory Visit: Payer: Self-pay | Admitting: Family Medicine

## 2018-01-03 ENCOUNTER — Telehealth: Payer: Self-pay

## 2018-01-03 DIAGNOSIS — G8929 Other chronic pain: Secondary | ICD-10-CM

## 2018-01-03 DIAGNOSIS — M545 Low back pain, unspecified: Secondary | ICD-10-CM

## 2018-01-03 DIAGNOSIS — M79641 Pain in right hand: Secondary | ICD-10-CM

## 2018-01-03 DIAGNOSIS — R269 Unspecified abnormalities of gait and mobility: Secondary | ICD-10-CM

## 2018-01-03 DIAGNOSIS — M069 Rheumatoid arthritis, unspecified: Secondary | ICD-10-CM

## 2018-01-03 DIAGNOSIS — M79642 Pain in left hand: Secondary | ICD-10-CM

## 2018-01-03 NOTE — Telephone Encounter (Signed)
Patient daughter called stated that per [facility] patient is now afraid to do anything due to her fall, so they are requesting a referral for OT and PT for [name] in hopes of patient getting back comfortable with walking by her self. Please advise. Rhonda Cunningham,CMA

## 2018-01-03 NOTE — Telephone Encounter (Signed)
The fax number [facility]  is [phone]. [name],CMA

## 2018-01-04 ENCOUNTER — Telehealth: Payer: Self-pay | Admitting: Family Medicine

## 2018-01-04 NOTE — Telephone Encounter (Signed)
Called daughter [name] on her cell phone and left message that both PT and OT has been ordered. KG LPN

## 2018-01-04 NOTE — Telephone Encounter (Signed)
Home health physical therapy ordered.

## 2018-01-04 NOTE — Telephone Encounter (Signed)
Referral printed and placed in Triage to be worked on. Rhonda Cunningham,CMA

## 2018-01-04 NOTE — Telephone Encounter (Signed)
I ordered PT and OT both in the notes for the referral

## 2018-01-04 NOTE — Telephone Encounter (Signed)
Daughter called. She wants to know if an order will be sent for OT as well(previous note says order sent for PT). Daughter wants [name] to call her today to update her. Thanks!

## 2018-01-08 DIAGNOSIS — E1151 Type 2 diabetes mellitus with diabetic peripheral angiopathy without gangrene: Secondary | ICD-10-CM | POA: Diagnosis not present

## 2018-01-08 DIAGNOSIS — M519 Unspecified thoracic, thoracolumbar and lumbosacral intervertebral disc disorder: Secondary | ICD-10-CM | POA: Diagnosis not present

## 2018-01-08 DIAGNOSIS — I4891 Unspecified atrial fibrillation: Secondary | ICD-10-CM | POA: Diagnosis not present

## 2018-01-08 DIAGNOSIS — I5032 Chronic diastolic (congestive) heart failure: Secondary | ICD-10-CM | POA: Diagnosis not present

## 2018-01-08 DIAGNOSIS — Z7901 Long term (current) use of anticoagulants: Secondary | ICD-10-CM | POA: Diagnosis not present

## 2018-01-08 DIAGNOSIS — I130 Hypertensive heart and chronic kidney disease with heart failure and stage 1 through stage 4 chronic kidney disease, or unspecified chronic kidney disease: Secondary | ICD-10-CM | POA: Diagnosis not present

## 2018-01-08 DIAGNOSIS — N183 Chronic kidney disease, stage 3 (moderate): Secondary | ICD-10-CM | POA: Diagnosis not present

## 2018-01-08 DIAGNOSIS — M069 Rheumatoid arthritis, unspecified: Secondary | ICD-10-CM | POA: Diagnosis not present

## 2018-01-08 DIAGNOSIS — I2510 Atherosclerotic heart disease of native coronary artery without angina pectoris: Secondary | ICD-10-CM | POA: Diagnosis not present

## 2018-01-09 DIAGNOSIS — I4891 Unspecified atrial fibrillation: Secondary | ICD-10-CM | POA: Diagnosis not present

## 2018-01-09 DIAGNOSIS — N183 Chronic kidney disease, stage 3 (moderate): Secondary | ICD-10-CM | POA: Diagnosis not present

## 2018-01-09 DIAGNOSIS — I5032 Chronic diastolic (congestive) heart failure: Secondary | ICD-10-CM | POA: Diagnosis not present

## 2018-01-09 DIAGNOSIS — I2510 Atherosclerotic heart disease of native coronary artery without angina pectoris: Secondary | ICD-10-CM | POA: Diagnosis not present

## 2018-01-09 DIAGNOSIS — I130 Hypertensive heart and chronic kidney disease with heart failure and stage 1 through stage 4 chronic kidney disease, or unspecified chronic kidney disease: Secondary | ICD-10-CM | POA: Diagnosis not present

## 2018-01-09 DIAGNOSIS — M069 Rheumatoid arthritis, unspecified: Secondary | ICD-10-CM | POA: Diagnosis not present

## 2018-01-09 DIAGNOSIS — E1151 Type 2 diabetes mellitus with diabetic peripheral angiopathy without gangrene: Secondary | ICD-10-CM | POA: Diagnosis not present

## 2018-01-09 DIAGNOSIS — M519 Unspecified thoracic, thoracolumbar and lumbosacral intervertebral disc disorder: Secondary | ICD-10-CM | POA: Diagnosis not present

## 2018-01-09 DIAGNOSIS — Z7901 Long term (current) use of anticoagulants: Secondary | ICD-10-CM | POA: Diagnosis not present

## 2018-01-12 DIAGNOSIS — M069 Rheumatoid arthritis, unspecified: Secondary | ICD-10-CM | POA: Diagnosis not present

## 2018-01-12 DIAGNOSIS — I130 Hypertensive heart and chronic kidney disease with heart failure and stage 1 through stage 4 chronic kidney disease, or unspecified chronic kidney disease: Secondary | ICD-10-CM | POA: Diagnosis not present

## 2018-01-12 DIAGNOSIS — E1151 Type 2 diabetes mellitus with diabetic peripheral angiopathy without gangrene: Secondary | ICD-10-CM | POA: Diagnosis not present

## 2018-01-12 DIAGNOSIS — I5032 Chronic diastolic (congestive) heart failure: Secondary | ICD-10-CM | POA: Diagnosis not present

## 2018-01-12 DIAGNOSIS — I4891 Unspecified atrial fibrillation: Secondary | ICD-10-CM | POA: Diagnosis not present

## 2018-01-12 DIAGNOSIS — N183 Chronic kidney disease, stage 3 (moderate): Secondary | ICD-10-CM | POA: Diagnosis not present

## 2018-01-12 DIAGNOSIS — Z7901 Long term (current) use of anticoagulants: Secondary | ICD-10-CM | POA: Diagnosis not present

## 2018-01-12 DIAGNOSIS — M519 Unspecified thoracic, thoracolumbar and lumbosacral intervertebral disc disorder: Secondary | ICD-10-CM | POA: Diagnosis not present

## 2018-01-12 DIAGNOSIS — I2510 Atherosclerotic heart disease of native coronary artery without angina pectoris: Secondary | ICD-10-CM | POA: Diagnosis not present

## 2018-01-15 ENCOUNTER — Telehealth: Payer: Self-pay | Admitting: Family Medicine

## 2018-01-15 ENCOUNTER — Ambulatory Visit (INDEPENDENT_AMBULATORY_CARE_PROVIDER_SITE_OTHER): Payer: Medicare PPO | Admitting: Family Medicine

## 2018-01-15 ENCOUNTER — Encounter: Payer: Self-pay | Admitting: Family Medicine

## 2018-01-15 VITALS — BP 118/70 | HR 72 | Wt 147.0 lb

## 2018-01-15 DIAGNOSIS — Z23 Encounter for immunization: Secondary | ICD-10-CM

## 2018-01-15 DIAGNOSIS — N3001 Acute cystitis with hematuria: Secondary | ICD-10-CM | POA: Diagnosis not present

## 2018-01-15 DIAGNOSIS — I130 Hypertensive heart and chronic kidney disease with heart failure and stage 1 through stage 4 chronic kidney disease, or unspecified chronic kidney disease: Secondary | ICD-10-CM | POA: Diagnosis not present

## 2018-01-15 DIAGNOSIS — I4891 Unspecified atrial fibrillation: Secondary | ICD-10-CM | POA: Diagnosis not present

## 2018-01-15 DIAGNOSIS — I2510 Atherosclerotic heart disease of native coronary artery without angina pectoris: Secondary | ICD-10-CM | POA: Diagnosis not present

## 2018-01-15 DIAGNOSIS — Z7901 Long term (current) use of anticoagulants: Secondary | ICD-10-CM | POA: Diagnosis not present

## 2018-01-15 DIAGNOSIS — N183 Chronic kidney disease, stage 3 (moderate): Secondary | ICD-10-CM | POA: Diagnosis not present

## 2018-01-15 DIAGNOSIS — F329 Major depressive disorder, single episode, unspecified: Secondary | ICD-10-CM | POA: Insufficient documentation

## 2018-01-15 DIAGNOSIS — R300 Dysuria: Secondary | ICD-10-CM

## 2018-01-15 DIAGNOSIS — M069 Rheumatoid arthritis, unspecified: Secondary | ICD-10-CM | POA: Diagnosis not present

## 2018-01-15 DIAGNOSIS — I5032 Chronic diastolic (congestive) heart failure: Secondary | ICD-10-CM | POA: Diagnosis not present

## 2018-01-15 DIAGNOSIS — F330 Major depressive disorder, recurrent, mild: Secondary | ICD-10-CM | POA: Diagnosis not present

## 2018-01-15 DIAGNOSIS — E1151 Type 2 diabetes mellitus with diabetic peripheral angiopathy without gangrene: Secondary | ICD-10-CM | POA: Diagnosis not present

## 2018-01-15 DIAGNOSIS — M519 Unspecified thoracic, thoracolumbar and lumbosacral intervertebral disc disorder: Secondary | ICD-10-CM | POA: Diagnosis not present

## 2018-01-15 LAB — POCT URINALYSIS DIPSTICK
BILIRUBIN UA: NEGATIVE
Glucose, UA: NEGATIVE
KETONES UA: NEGATIVE
NITRITE UA: NEGATIVE
PROTEIN UA: POSITIVE — AB
SPEC GRAV UA: 1.02 (ref 1.010–1.025)
UROBILINOGEN UA: 1 U/dL
pH, UA: 6 (ref 5.0–8.0)

## 2018-01-15 MED ORDER — MIRTAZAPINE 15 MG PO TABS: 15.0000 mg | ORAL_TABLET | Freq: Every day | ORAL | 11 refills | Status: DC

## 2018-01-15 MED ORDER — SULFAMETHOXAZOLE-TRIMETHOPRIM 800-160 MG PO TABS: 1.0000 | ORAL_TABLET | Freq: Two times a day (BID) | ORAL | 0 refills | Status: DC

## 2018-01-15 NOTE — Progress Notes (Signed)
[name] [name] is a 84 y.o. female who presents to [facility] [name] [name]: Primary Care Sports Medicine today for urinary frequency.   [name] [name] has a history of frequent UTI. She has a several day history of urinary frequency, urgency. She notes that the urine is also cloudy.  Symptoms are consistent with previous episodes of UTI.  She denies any pain or dysuria.  No fevers chills nausea vomiting or diarrhea.  No treatment tried yet.  Additionally [name] [name] has a history of major depression.  This is been typically pretty well controlled with mirtazapine at bedtime.  She is taking 7.5 mg nightly.  She notes her symptoms have worsened slightly recently.  She notes less energy and more fatigue and difficulty concentrating.  Her family is worried that her that her depression is worsening or she may have developed a bit of dementia.  Her depression was worse almost exactly a year ago following the death of family member.  She notes that she may be feeling a bit worse now because of the 1 year anniversary.   ROS as above:  Exam:  BP 118/70   Pulse 72   Wt 147 lb (66.7 kg)   BMI 26.89 kg/m  Wt Readings from Last 5 Encounters:  01/15/18 147 lb (66.7 kg)  11/21/17 149 lb (67.6 kg)  10/17/17 155 lb (70.3 kg)  09/21/17 153 lb (69.4 kg)  09/10/17 149 lb 8 oz (67.8 kg)    Gen: Well NAD HEENT: EOMI,  MMM Lungs: Normal work of breathing. CTABL Heart: RRR no MRG Abd: NABS, Soft. Nondistended, Nontender no CVA angle tenderness to percussion Exts: Brisk capillary refill, warm and well perfused.  Psych alert and oriented normal speech thought process and affect.  Depression screen [facility] 2/9 01/15/2018 01/15/2018 05/17/2017 01/12/2017 12/01/2016  Decreased Interest 1 0 1 3 3   Down, Depressed, Hopeless 0 0 1 3 3   PHQ - 2 Score 1 0 2 6 6   Altered sleeping 1 - 1 1 3   Tired, decreased energy 1 - 1 2 3   Change in appetite 1 - 2 0  0  Feeling bad or failure about yourself  0 - 0 0 0  Trouble concentrating 1 - 1 3 3   Moving slowly or fidgety/restless 1 - 1 2 0  Suicidal thoughts 0 - 1 0 0  PHQ-9 Score 6 - 9 14 15   Difficult doing work/chores Not difficult at all - Not difficult at all - -  Some recent data might be hidden     Lab and Radiology Results Results for orders placed or performed in visit on 01/15/18 (from the past 72 hour(s))  POCT Urinalysis Dipstick     Status: Abnormal   Collection Time: 01/15/18  2:28 PM  Result Value Ref Range   Color, UA YELLOW    Clarity, UA CLOUDY    Glucose, UA Negative Negative   Bilirubin, UA NEGATIUVE    Ketones, UA NEGATIVE    Spec Grav, UA 1.020 1.010 - 1.025   Blood, UA MODERATE    pH, UA 6.0 5.0 - 8.0   Protein, UA Positive (A) Negative   Urobilinogen, UA 1.0 0.2 or 1.0 E.U./dL   Nitrite, UA NEGATIVE'    Leukocytes, UA Large (3+) (A) Negative   Appearance F    Odor     No results found.    Assessment and Plan: 84 y.o. female with  UTI.  Urine culture pending.  Empiric treatment with Bactrim.  Patient has multiple drug allergies complicating antibiotic choice.  Recheck as scheduled on October 17.  Return sooner if needed.  Depression: Slightly worsened.  Plan to increase mirtazapine to 15 mg and recheck as scheduled on the 17th.  Administer high-dose flu vaccine today prior to discharge.   Orders Placed This Encounter  Procedures  . Urine Culture  . Flu vaccine HIGH DOSE PF (Fluzone High dose)  . POCT Urinalysis Dipstick   Meds ordered this encounter  Medications  . sulfamethoxazole-trimethoprim (BACTRIM DS,SEPTRA DS) 800-160 MG tablet    Sig: Take 1 tablet by mouth 2 (two) times daily.    Dispense:  14 tablet    Refill:  0  . mirtazapine (REMERON) 15 MG tablet    Sig: Take 1 tablet (15 mg total) by mouth at bedtime.    Dispense:  30 tablet    Refill:  11    Replaces 1/2 pill dose     Historical information moved to improve visibility of  documentation.  Past Medical History:  Diagnosis Date  . Acute pancreatitis   . Anemia   . Arthritis   . Atrial fibrillation (HCC)   . CAD (coronary artery disease)   . Carotid artery occlusion   . CHF (congestive heart failure) (HCC)   . Diabetes mellitus age 71  . DJD (degenerative joint disease)   . DVT (deep venous thrombosis) (HCC)   . GERD (gastroesophageal reflux disease)   . GI bleed   . Hyperlipidemia   . Hypertension   . Joint pain   . Mesenteric ischemia   . Peripheral vascular disease (HCC)   . Thyroid disease    Past Surgical History:  Procedure Laterality Date  . APPENDECTOMY    . CELIAC ARTERY STENT    . CORONARY ARTERY BYPASS GRAFT    . EMBOLECTOMY  2009   right leg  . FASCIOTOMY     right leg  . HIP FRACTURE SURGERY Left   . JOINT REPLACEMENT    . TOTAL KNEE ARTHROPLASTY     bilateral  . TUBAL LIGATION    . VISCERAL ANGIOGRAM N/A 05/31/2013   Procedure: MESSENTERIC [name] [name];  Surgeon: [name], MD;  Location: [facility] CATH LAB;  Service: Cardiovascular;  Laterality: N/A;   Social History   Tobacco Use  . Smoking status: Never Smoker  . Smokeless tobacco: Never Used  Substance Use Topics  . Alcohol use: No   family history includes Cancer in her daughter and mother; Coronary artery disease in her sister; Diabetes in her daughter, sister, and son; Heart disease in her sister; Hyperlipidemia in her son; Hypertension in her daughter and son; Other in her father and sister.  Medications: Current Outpatient Medications  Medication Sig Dispense Refill  . AMBULATORY NON FORMULARY MEDICATION Please check INR weekly for 2 months. Send reports to Dr Denyse Amass fax [phone] 1 each 0  . apixaban (ELIQUIS) 5 MG TABS tablet Take 1 tablet (5 mg total) by mouth 2 (two) times daily. 180 tablet 0  . Calcium Carb-Cholecalciferol 600-800 MG-UNIT TABS TAKE ONE TABLET BY MOUTH 2 TIMES A DAY 60 tablet 6  . clonazePAM (KLONOPIN) 0.5 MG tablet TAKE 1 TABLET (0.5 MG  TOTAL) BY MOUTH AT BEDTIME. 30 tablet 5  . diltiazem (CARDIZEM CD) 180 MG 24 hr capsule Take 1 capsule (180 mg total) by mouth at bedtime. 90 capsule 3  . Ergocalciferol (VITAMIN D2) 2000 units TABS Take 2,000 Units by mouth daily.     . febuxostat (ULORIC)  40 MG tablet Take 40 mg by mouth daily.    . Ferrous Bisglycinate Chelate 15 MG TABS Take 1 tablet by mouth 2 (two) times daily. 180 tablet 3  . folic acid (FOLVITE) 1 MG tablet Take 1 tablet (1 mg total) by mouth daily. 90 tablet 3  . glucose blood test strip Once daily E11.29 100 each 12  . leflunomide (ARAVA) 10 MG tablet Take 1 tablet (10 mg total) by mouth at bedtime. 90 tablet 1  . levothyroxine (SYNTHROID, LEVOTHROID) 50 MCG tablet Take 1 tablet (50 mcg total) by mouth daily. 90 tablet 1  . metoprolol succinate (TOPROL-XL) 50 MG 24 hr tablet Take 1 tablet (50 mg total) by mouth daily. 90 tablet 3  . mirtazapine (REMERON) 15 MG tablet Take 1 tablet (15 mg total) by mouth at bedtime. 30 tablet 11  . pravastatin (PRAVACHOL) 40 MG tablet TAKE 1 TABLET (40 MG TOTAL) BY MOUTH DAILY. 90 tablet 1  . PREMARIN vaginal cream PLACE 1 APPLICATORFUL VAGINALLY ONCE A DAY 30 g 11  . STOOL SOFTENER 100 MG capsule TAKE 1 CAPSULE BY MOUTH 2 TIMES A DAY (AM & PM) (Patient taking differently: TAKE 100 MG BY MOUTH 2 TIMES A DAY (AM & PM)) 56 capsule 0  . torsemide (DEMADEX) 100 MG tablet Take 0.5 tablets (50 mg total) by mouth 2 (two) times daily. 180 tablet 1  . traMADol (ULTRAM) 50 MG tablet TAKE 1 TABLET BY MOUTH TWO TIMES A DAY AS NEEDED (BOTTLES) 60 tablet 5  . traZODone (DESYREL) 50 MG tablet Take 0.5-1 tablets (25-50 mg total) by mouth at bedtime as needed for sleep. 90 tablet 0  . triamcinolone cream (KENALOG) 0.5 % Apply 1 application topically 2 (two) times daily. To affected areas. 30 g 3  . vitamin B-12 (CYANOCOBALAMIN) 1000 MCG tablet Take 1,000 mcg by mouth daily.    Marland Kitchen sulfamethoxazole-trimethoprim (BACTRIM DS,SEPTRA DS) 800-160 MG tablet Take  1 tablet by mouth 2 (two) times daily. 14 tablet 0   No current facility-administered medications for this visit.    Allergies  Allergen Reactions  . Cefuroxime Anaphylaxis and Other (See Comments)    Throat swelling  . Fish Allergy Shortness Of Breath    Pt said she has throat swelling   . Ciprofloxacin Rash  . Codeine Other (See Comments)    Jittery Jittery  . Sertraline Hcl Rash  . Cephalexin Rash     Discussed warning signs or symptoms. Please see discharge instructions. Patient expresses understanding.

## 2018-01-15 NOTE — Patient Instructions (Addendum)
Thank you for coming in today. Start antibiotic twice daily for 1 week.  Ok to increase mirtazapine to 1 full pill at bedtime.  Recheck with me as scheduled on the 17th.  Return sooner if needed.  I will let you know the results of the urine culture when available.   Drinking water will help some.   Memory may improve with better control of depression symptoms.    Urinary Frequency, Adult Urinary frequency means urinating more often than usual. People with urinary frequency urinate at least 8 times in 24 hours, even if they drink a normal amount of fluid. Although they urinate more often than normal, the total amount of urine produced in a day may be normal. Urinary frequency is also called pollakiuria. What are the causes? This condition may be caused by:  A urinary tract infection.  Obesity.  Bladder problems, such as bladder stones.  Caffeine or alcohol.  Eating food or drinking fluids that irritate the bladder. These include coffee, tea, soda, artificial sweeteners, citrus, tomato-based foods, and chocolate.  Certain medicines, such as medicines that help the body get rid of extra fluid (diuretics).  Muscle or nerve weakness.  Overactive bladder.  Chronic diabetes.  Interstitial cystitis.  In men, problems with the prostate, such as an enlarged prostate.  In women, pregnancy.  In some cases, the cause may not be known. What increases the risk? This condition is more likely to develop in:  Women who have gone through menopause.  Men with prostate problems.  People with a disease or injury that affects the nerves or spinal cord.  People who have or have had a condition that affects the brain, such as a stroke.  What are the signs or symptoms? Symptoms of this condition include:  Feeling an urgent need to urinate often. The stress and anxiety of needing to find a bathroom quickly can make this urge worse.  Urinating 8 or more times in 24 hours.  Urinating  as often as every 1 to 2 hours.  How is this diagnosed? This condition is diagnosed based on your symptoms, your medical history, and a physical exam. You may have tests, such as:  Blood tests.  Urine tests.  Imaging tests, such as X-rays or ultrasounds.  A bladder test.  A test of your neurological system. This is the body system that senses the need to urinate.  A test to check for problems in the urethra and bladder called cystoscopy.  You may also be asked to keep a bladder diary. A bladder diary is a record of what you eat and drink, how often you urinate, and how much you urinate. You may need to see a health care provider who specializes in conditions of the urinary tract (urologist) or kidneys (nephrologist). How is this treated? Treatment for this condition depends on the cause. Sometimes the condition goes away on its own and treatment is not necessary. If treatment is needed, it may include:  Taking medicine.  Learning exercises that strengthen the muscles that help control urination.  Following a bladder training program. This may include: ? Learning to delay going to the bathroom. ? Double urinating (voiding). This helps if you are not completely emptying your bladder. ? Scheduled voiding.  Making diet changes, such as: ? Avoiding caffeine. ? Drinking fewer fluids, especially alcohol. ? Not drinking in the evening. ? Not having foods or drinks that may irritate the bladder. ? Eating foods that help prevent or ease constipation. Constipation can make  this condition worse.  Having the nerves in your bladder stimulated. There are two options for stimulating the nerves to your bladder: ? Outpatient electrical nerve stimulation. This is done by your health care provider. ? Surgery to implant a bladder pacemaker. The pacemaker helps to control the urge to urinate.  Follow these instructions at home:  Keep a bladder diary if told to by your health care  provider.  Take over-the-counter and prescription medicines only as told by your health care provider.  Do any exercises as told by your health care provider.  Follow a bladder training program as told by your health care provider.  Make any recommended diet changes.  Keep all follow-up visits as told by your health care provider. This is important. Contact a health care provider if:  You start urinating more often.  You feel pain or irritation when you urinate.  You notice blood in your urine.  Your urine looks cloudy.  You develop a fever.  You begin vomiting. Get help right away if:  You are unable to urinate. This information is not intended to replace advice given to you by your health care provider. Make sure you discuss any questions you have with your health care provider. Document Released: 01/22/2009 Document Revised: 04/29/2015 Document Reviewed: 10/22/2014 Elsevier Interactive Patient Education  [employer].

## 2018-01-15 NOTE — Telephone Encounter (Signed)
Patient came in for her visit today with her son and daughter in law. . She was given the high Dose flu vaccine as well as a urine culture was ordered. Rhonda Cunningham,CMA

## 2018-01-15 NOTE — Telephone Encounter (Signed)
Pt's daughter called this morning. She wanted to make you aware of a couple of things. Pt does have an appt with an urologist here in [place]. Also that Pt's processing/memory are getting worse even thogh she has been working with someone for OT/PT recently. Daughter would also like Pt to get a flu shot today. Any questions please call her daughter.

## 2018-01-16 ENCOUNTER — Telehealth: Payer: Self-pay | Admitting: Family Medicine

## 2018-01-16 MED ORDER — PAROXETINE HCL 10 MG PO TABS: 10.0000 mg | ORAL_TABLET | Freq: Every day | ORAL | 1 refills | Status: DC

## 2018-01-16 MED ORDER — FLUOXETINE HCL 10 MG PO CAPS: 10.0000 mg | ORAL_CAPSULE | Freq: Every day | ORAL | 1 refills | Status: DC

## 2018-01-16 NOTE — Telephone Encounter (Signed)
Pt's daughter called this morning. She said that [name] was seen yesterday for UTI. You prescribed mirtazapine for her. Daughter says she doesn't want mom to take this. It makes her "zombie-like". She said mom took zoloft before,but she can't remember the side effects. She wanted to know if you could suggest anything else. Any questions, please call Tonya at [phone].

## 2018-01-16 NOTE — Telephone Encounter (Signed)
Routing to provider  

## 2018-01-16 NOTE — Telephone Encounter (Signed)
Spoke to patient daughter she stated that Prozac would seem too strong for her mother. She wants to know if you can send in Paxil at a low dose to patient pharmacy. Please advise. Kiyonna Tortorelli,CMA

## 2018-01-16 NOTE — Telephone Encounter (Signed)
Patient had rash with Zoloft.  We will try Prozac.  Medication sent to [facility]

## 2018-01-16 NOTE — Telephone Encounter (Signed)
Will use Paxil instead then.  Low-dose sent to [facility].  We will contact [facility] and let them know to discontinue Prozac

## 2018-01-16 NOTE — Telephone Encounter (Signed)
Spoke to patient daughter gave advised her that medication has been sent to pharmacy. Rhonda Cunningham,CMA

## 2018-01-17 ENCOUNTER — Emergency Department (HOSPITAL_COMMUNITY): Payer: Medicare PPO

## 2018-01-17 ENCOUNTER — Observation Stay (HOSPITAL_COMMUNITY)
Admission: EM | Admit: 2018-01-17 | Discharge: 2018-01-19 | Disposition: A | Discharge status: Home / Self Care | Payer: Medicare PPO | Attending: Internal Medicine | Admitting: Internal Medicine

## 2018-01-17 ENCOUNTER — Other Ambulatory Visit: Payer: Self-pay

## 2018-01-17 ENCOUNTER — Encounter (HOSPITAL_COMMUNITY): Payer: Self-pay

## 2018-01-17 ENCOUNTER — Observation Stay (HOSPITAL_BASED_OUTPATIENT_CLINIC_OR_DEPARTMENT_OTHER): Payer: Medicare PPO

## 2018-01-17 DIAGNOSIS — N179 Acute kidney failure, unspecified: Principal | ICD-10-CM | POA: Insufficient documentation

## 2018-01-17 DIAGNOSIS — E039 Hypothyroidism, unspecified: Secondary | ICD-10-CM | POA: Diagnosis not present

## 2018-01-17 DIAGNOSIS — E1122 Type 2 diabetes mellitus with diabetic chronic kidney disease: Secondary | ICD-10-CM | POA: Diagnosis not present

## 2018-01-17 DIAGNOSIS — I4891 Unspecified atrial fibrillation: Secondary | ICD-10-CM | POA: Diagnosis not present

## 2018-01-17 DIAGNOSIS — I130 Hypertensive heart and chronic kidney disease with heart failure and stage 1 through stage 4 chronic kidney disease, or unspecified chronic kidney disease: Secondary | ICD-10-CM | POA: Insufficient documentation

## 2018-01-17 DIAGNOSIS — R269 Unspecified abnormalities of gait and mobility: Secondary | ICD-10-CM | POA: Diagnosis not present

## 2018-01-17 DIAGNOSIS — N183 Chronic kidney disease, stage 3 unspecified: Secondary | ICD-10-CM | POA: Diagnosis present

## 2018-01-17 DIAGNOSIS — B961 Klebsiella pneumoniae [K. pneumoniae] as the cause of diseases classified elsewhere: Secondary | ICD-10-CM | POA: Insufficient documentation

## 2018-01-17 DIAGNOSIS — R531 Weakness: Secondary | ICD-10-CM | POA: Diagnosis not present

## 2018-01-17 DIAGNOSIS — E785 Hyperlipidemia, unspecified: Secondary | ICD-10-CM | POA: Diagnosis not present

## 2018-01-17 DIAGNOSIS — W1830XA Fall on same level, unspecified, initial encounter: Secondary | ICD-10-CM | POA: Diagnosis not present

## 2018-01-17 DIAGNOSIS — S069X9A Unspecified intracranial injury with loss of consciousness of unspecified duration, initial encounter: Secondary | ICD-10-CM | POA: Diagnosis not present

## 2018-01-17 DIAGNOSIS — J9811 Atelectasis: Secondary | ICD-10-CM | POA: Diagnosis not present

## 2018-01-17 DIAGNOSIS — R609 Edema, unspecified: Secondary | ICD-10-CM | POA: Diagnosis not present

## 2018-01-17 DIAGNOSIS — N390 Urinary tract infection, site not specified: Secondary | ICD-10-CM | POA: Diagnosis not present

## 2018-01-17 DIAGNOSIS — E1129 Type 2 diabetes mellitus with other diabetic kidney complication: Secondary | ICD-10-CM | POA: Diagnosis present

## 2018-01-17 DIAGNOSIS — Z7901 Long term (current) use of anticoagulants: Secondary | ICD-10-CM | POA: Insufficient documentation

## 2018-01-17 DIAGNOSIS — I2510 Atherosclerotic heart disease of native coronary artery without angina pectoris: Secondary | ICD-10-CM | POA: Insufficient documentation

## 2018-01-17 DIAGNOSIS — N3000 Acute cystitis without hematuria: Secondary | ICD-10-CM

## 2018-01-17 DIAGNOSIS — M25551 Pain in right hip: Secondary | ICD-10-CM | POA: Diagnosis not present

## 2018-01-17 DIAGNOSIS — Z951 Presence of aortocoronary bypass graft: Secondary | ICD-10-CM | POA: Diagnosis not present

## 2018-01-17 DIAGNOSIS — Z79899 Other long term (current) drug therapy: Secondary | ICD-10-CM | POA: Diagnosis not present

## 2018-01-17 DIAGNOSIS — M79604 Pain in right leg: Secondary | ICD-10-CM | POA: Diagnosis present

## 2018-01-17 DIAGNOSIS — Z66 Do not resuscitate: Secondary | ICD-10-CM | POA: Diagnosis not present

## 2018-01-17 DIAGNOSIS — I5032 Chronic diastolic (congestive) heart failure: Secondary | ICD-10-CM | POA: Diagnosis not present

## 2018-01-17 DIAGNOSIS — I10 Essential (primary) hypertension: Secondary | ICD-10-CM | POA: Diagnosis present

## 2018-01-17 DIAGNOSIS — W19XXXA Unspecified fall, initial encounter: Secondary | ICD-10-CM

## 2018-01-17 DIAGNOSIS — M069 Rheumatoid arthritis, unspecified: Secondary | ICD-10-CM | POA: Insufficient documentation

## 2018-01-17 DIAGNOSIS — Z79890 Hormone replacement therapy (postmenopausal): Secondary | ICD-10-CM | POA: Diagnosis not present

## 2018-01-17 DIAGNOSIS — F329 Major depressive disorder, single episode, unspecified: Secondary | ICD-10-CM | POA: Diagnosis not present

## 2018-01-17 DIAGNOSIS — E1151 Type 2 diabetes mellitus with diabetic peripheral angiopathy without gangrene: Secondary | ICD-10-CM | POA: Insufficient documentation

## 2018-01-17 DIAGNOSIS — M25561 Pain in right knee: Secondary | ICD-10-CM | POA: Diagnosis not present

## 2018-01-17 DIAGNOSIS — Z96653 Presence of artificial knee joint, bilateral: Secondary | ICD-10-CM | POA: Insufficient documentation

## 2018-01-17 DIAGNOSIS — S79911A Unspecified injury of right hip, initial encounter: Secondary | ICD-10-CM | POA: Diagnosis not present

## 2018-01-17 DIAGNOSIS — Z86718 Personal history of other venous thrombosis and embolism: Secondary | ICD-10-CM | POA: Diagnosis not present

## 2018-01-17 DIAGNOSIS — S8991XA Unspecified injury of right lower leg, initial encounter: Secondary | ICD-10-CM | POA: Diagnosis not present

## 2018-01-17 LAB — CBC WITH DIFFERENTIAL/PLATELET
Abs Immature Granulocytes: 0.03 10*3/uL (ref 0.00–0.07)
BASOS ABS: 0 10*3/uL (ref 0.0–0.1)
Basophils Relative: 1 %
EOS ABS: 0 10*3/uL (ref 0.0–0.5)
Eosinophils Relative: 0 %
HCT: 33.9 % — ABNORMAL LOW (ref 36.0–46.0)
Hemoglobin: 10.9 g/dL — ABNORMAL LOW (ref 12.0–15.0)
IMMATURE GRANULOCYTES: 1 %
Lymphocytes Relative: 20 %
Lymphs Abs: 0.8 10*3/uL (ref 0.7–4.0)
MCH: 31.3 pg (ref 26.0–34.0)
MCHC: 32.2 g/dL (ref 30.0–36.0)
MCV: 97.4 fL (ref 80.0–100.0)
Monocytes Absolute: 0.5 10*3/uL (ref 0.1–1.0)
Monocytes Relative: 12 %
NEUTROS PCT: 66 %
NRBC: 0 % (ref 0.0–0.2)
Neutro Abs: 2.7 10*3/uL (ref 1.7–7.7)
Platelets: 357 10*3/uL (ref 150–400)
RBC: 3.48 MIL/uL — AB (ref 3.87–5.11)
RDW: 16.8 % — AB (ref 11.5–15.5)
WBC: 4 10*3/uL (ref 4.0–10.5)

## 2018-01-17 LAB — BASIC METABOLIC PANEL
ANION GAP: 14 (ref 5–15)
BUN: 30 mg/dL — ABNORMAL HIGH (ref 8–23)
CALCIUM: 9.8 mg/dL (ref 8.9–10.3)
CO2: 26 mmol/L (ref 22–32)
Chloride: 92 mmol/L — ABNORMAL LOW (ref 98–111)
Creatinine, Ser: 1.95 mg/dL — ABNORMAL HIGH (ref 0.44–1.00)
GFR, EST AFRICAN AMERICAN: 26 mL/min — AB (ref >60)
GFR, EST NON AFRICAN AMERICAN: 22 mL/min — AB (ref >60)
Glucose, Bld: 172 mg/dL — ABNORMAL HIGH (ref 70–99)
Potassium: 3.8 mmol/L (ref 3.5–5.1)
Sodium: 132 mmol/L — ABNORMAL LOW (ref 135–145)

## 2018-01-17 LAB — CBG MONITORING, ED
GLUCOSE-CAPILLARY: 116 mg/dL — AB (ref 70–99)
Glucose-Capillary: 173 mg/dL — ABNORMAL HIGH (ref 70–99)

## 2018-01-17 LAB — URINE CULTURE
MICRO NUMBER:: 91202817
SPECIMEN QUALITY:: ADEQUATE

## 2018-01-17 LAB — TYPE AND SCREEN
ABO/RH(D): A POS
Antibody Screen: NEGATIVE

## 2018-01-17 LAB — PROTIME-INR
INR: 2.28
Prothrombin Time: 24.9 seconds — ABNORMAL HIGH (ref 11.4–15.2)

## 2018-01-17 LAB — TSH: TSH: 3.237 u[IU]/mL (ref 0.350–4.500)

## 2018-01-17 LAB — MRSA PCR SCREENING: MRSA by PCR: NEGATIVE

## 2018-01-17 LAB — CK: Total CK: 569 U/L — ABNORMAL HIGH (ref 38–234)

## 2018-01-17 IMAGING — DX DG KNEE COMPLETE 4+V*R*
4 series · 4 of 4 positions shown · non-contrast
Comparison: None.

CLINICAL DATA: Fall.  Pain

EXAM:
RIGHT KNEE - COMPLETE 4+ VIEW

[knee ap]
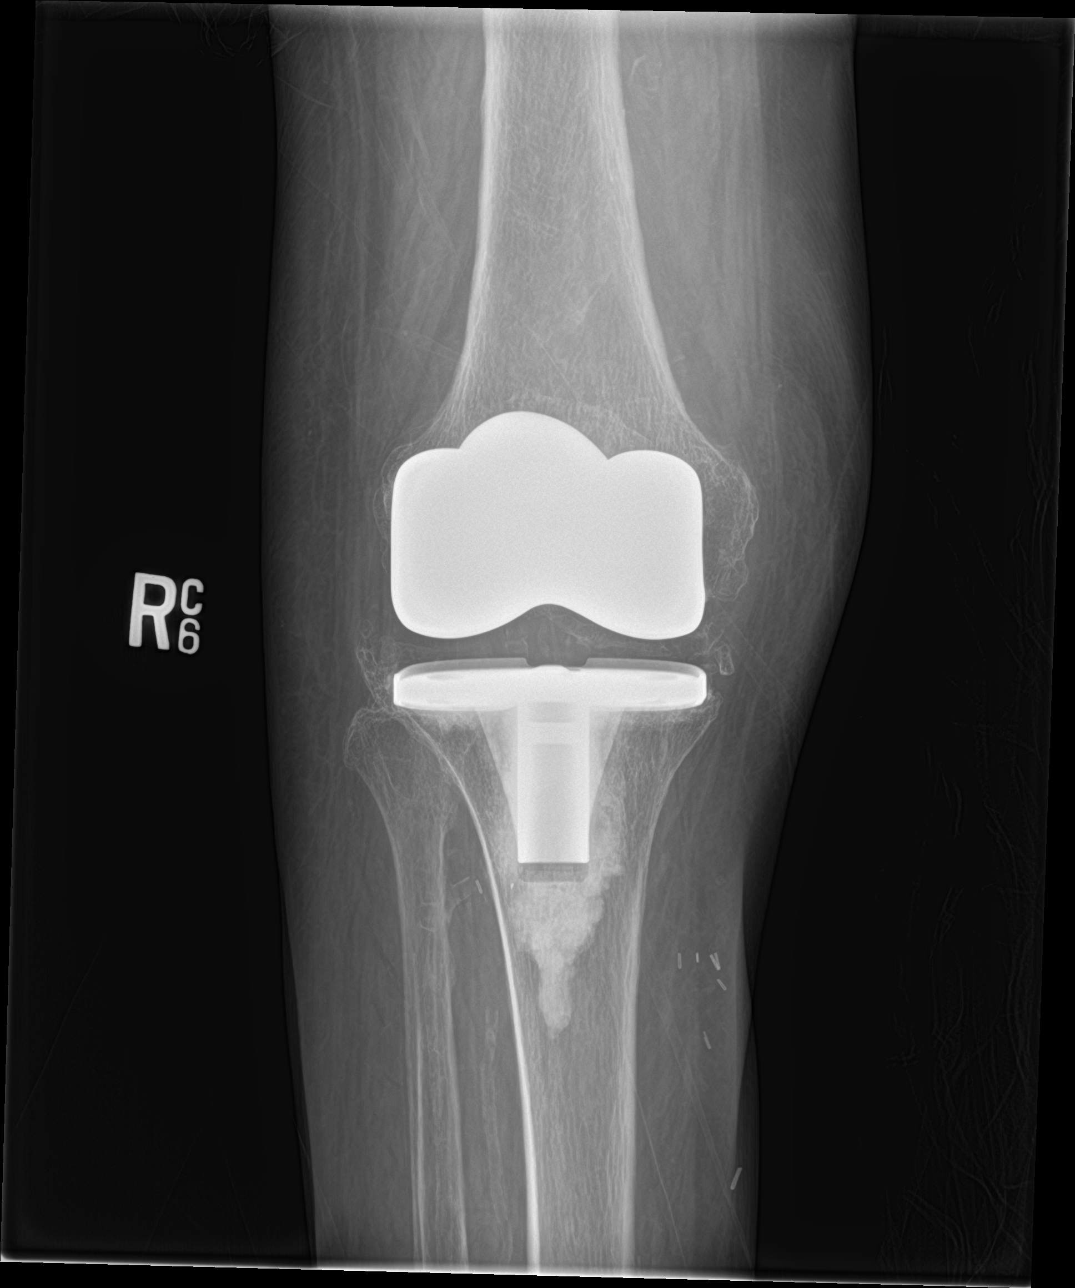

[knee lat]
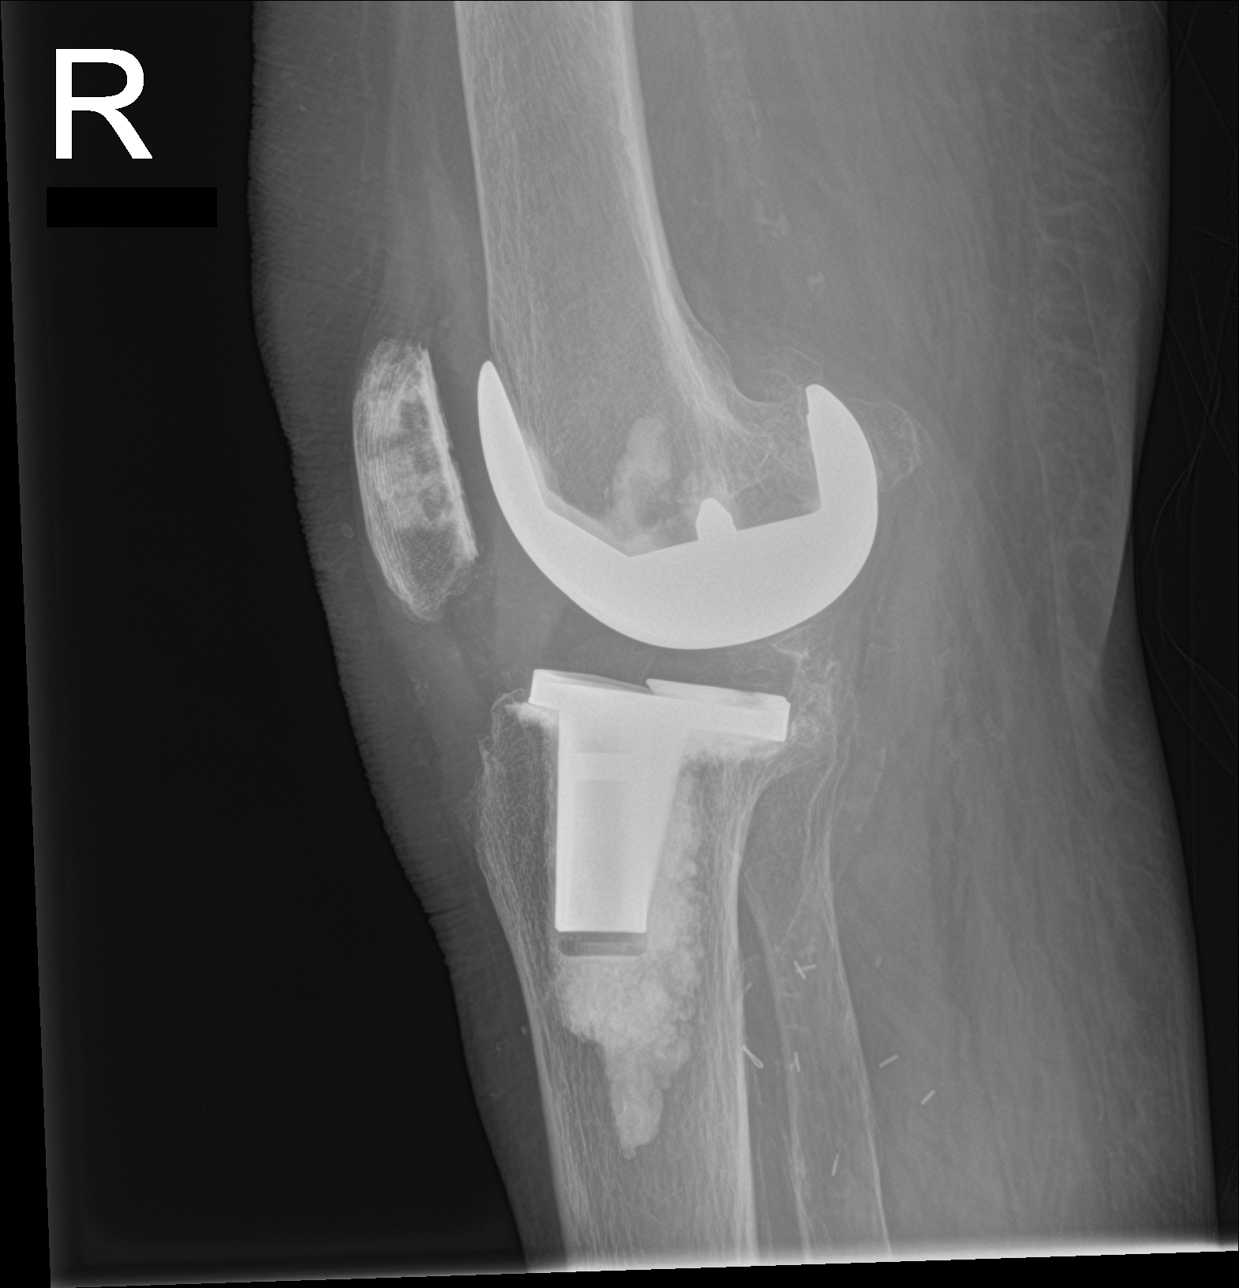

[knee obl (1 of 2)]
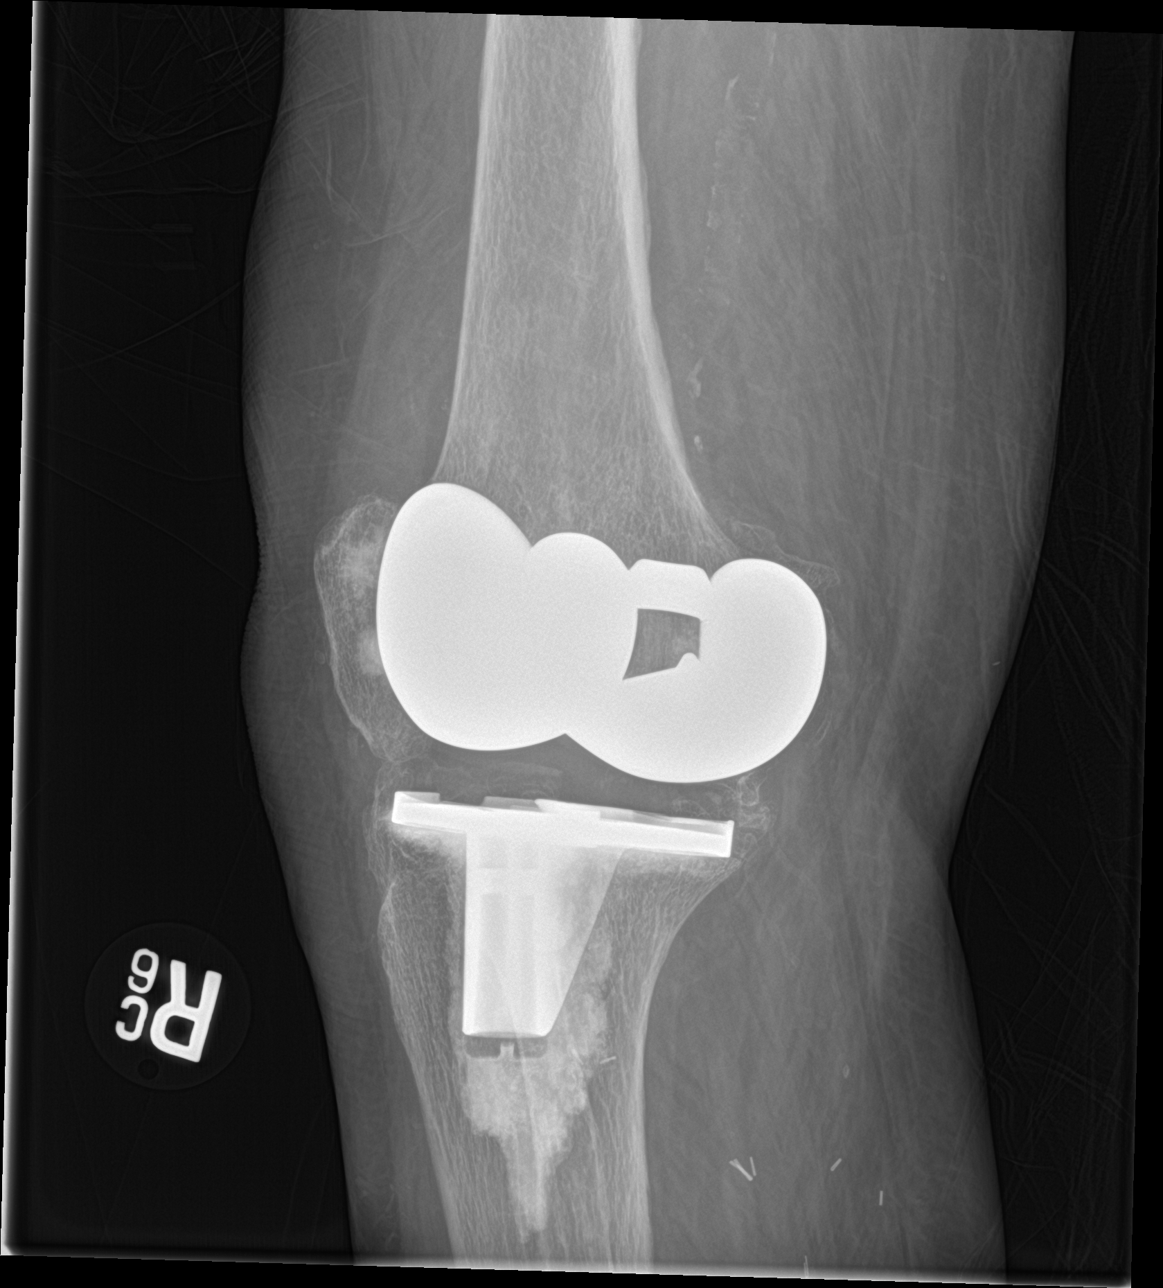

[knee obl (2 of 2)]
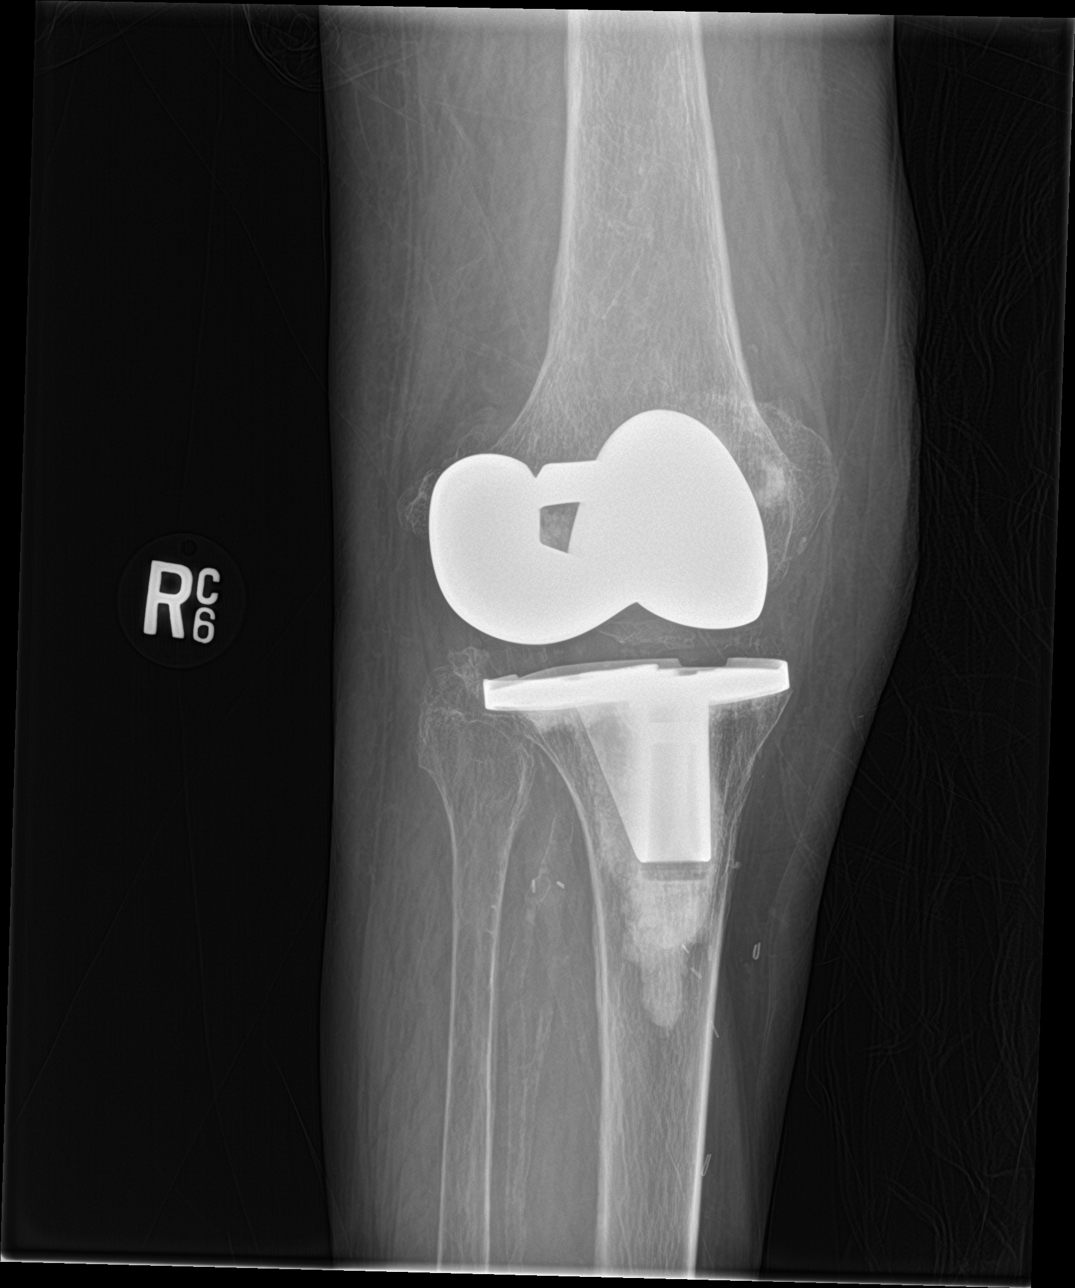

[4 of 4 positions shown; findings below may reference images not displayed]

FINDINGS: Total knee replacement in satisfactory position. No fracture
identified. Mild arterial calcification.
IMPRESSION: Negative for fracture

## 2018-01-17 IMAGING — CT CT HEAD W/O CM
4 series · 16 of 47 positions shown, 18 images · non-contrast
Comparison: None.

CLINICAL DATA: Altered level of consciousness. Found on floor.
Recent fall

EXAM:
CT HEAD WITHOUT CONTRAST
TECHNIQUE: Contiguous axial images were obtained from the base of the skull
through the vertex without intravenous contrast.

[Series 3: head without · axial · non-contrast · 0.44mm/px · z∈[-98,+22]mm · 7 of 33 slices shown, 9 images]
[im 5/33  brain]
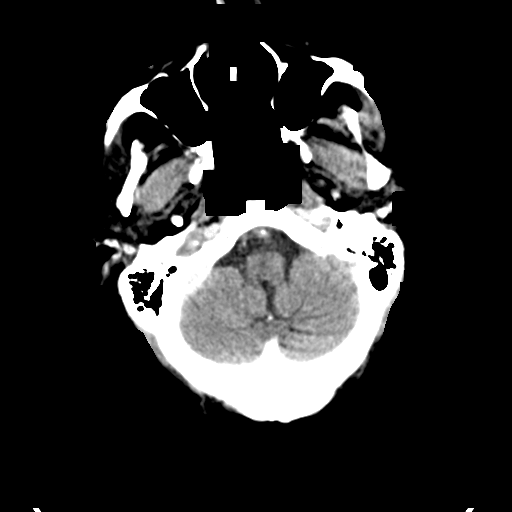
[im 5/33  bone]
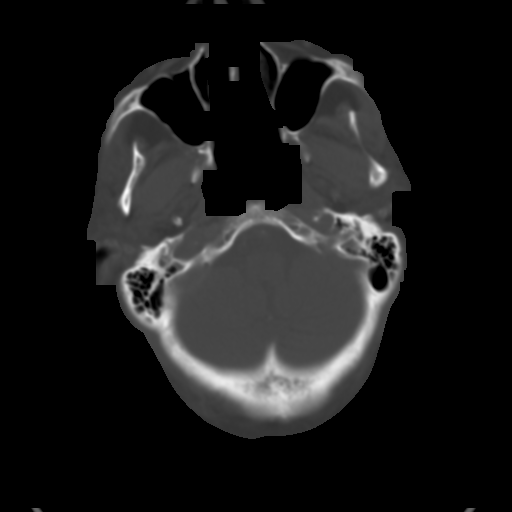
[im 9/33  brain]
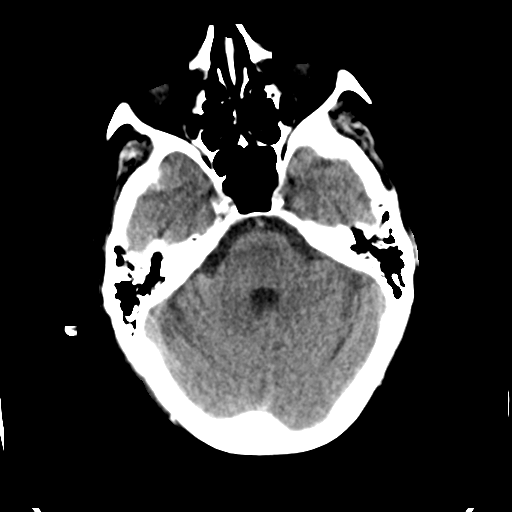
[im 13/33  brain]
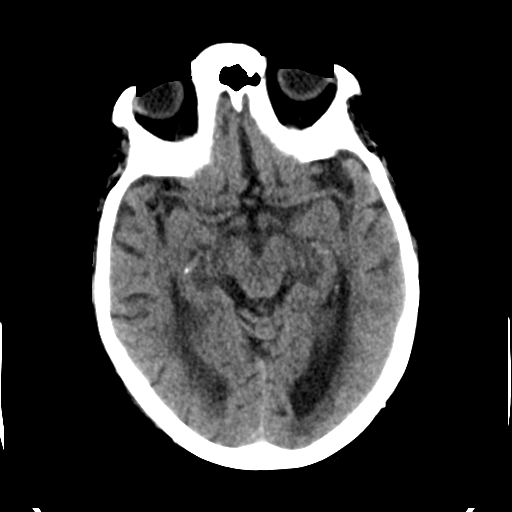
[im 17/33  brain]
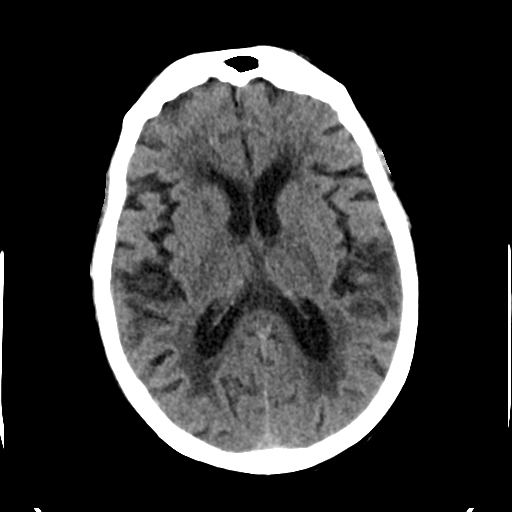
[im 21/33  brain]
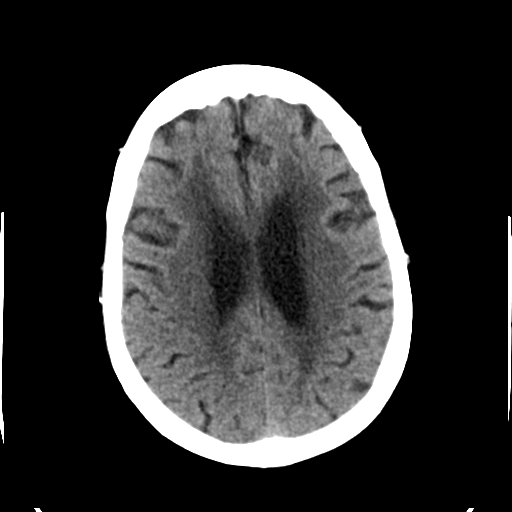
[im 21/33  bone]
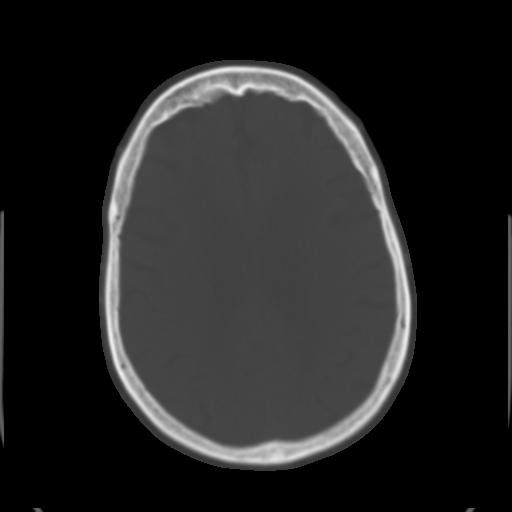
[im 25/33  brain]
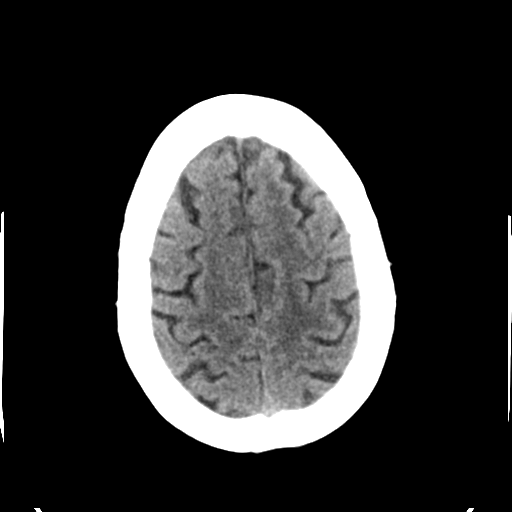
[im 29/33  brain]
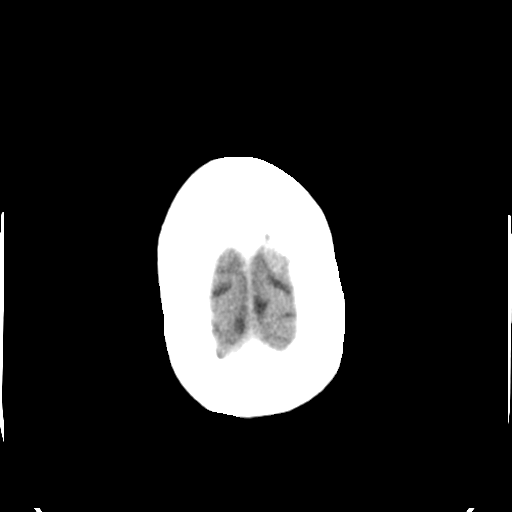

[Series 4: head bone · axial · 0.44mm/px · z∈[-102,-70]mm · 3 of 82 slices shown]
[im 9/82  bone]
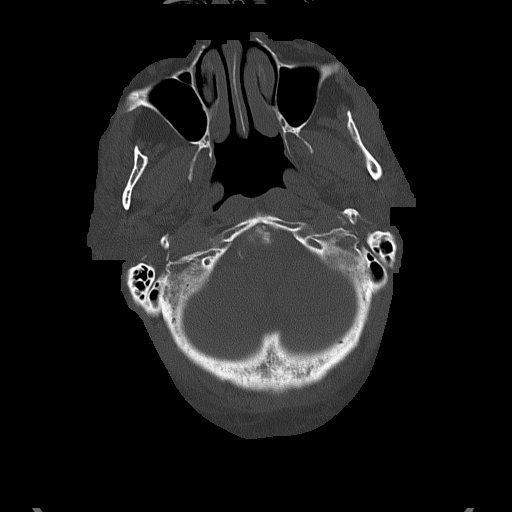
[im 17/82  bone]
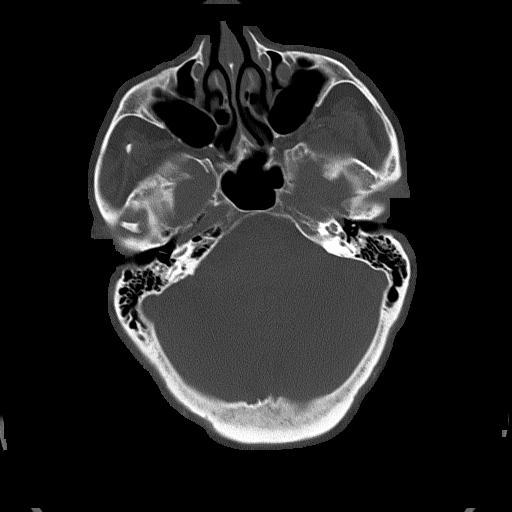
[im 25/82  bone]
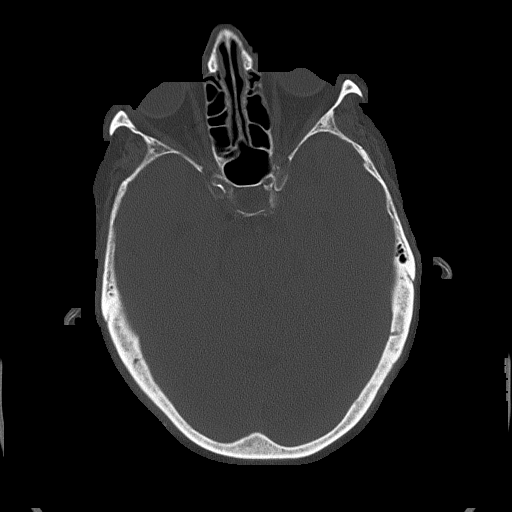

[Series 5: head without cor · coronal · non-contrast · 0.33mm/px · 3 of 67 slices shown]
[im 23/67  brain]
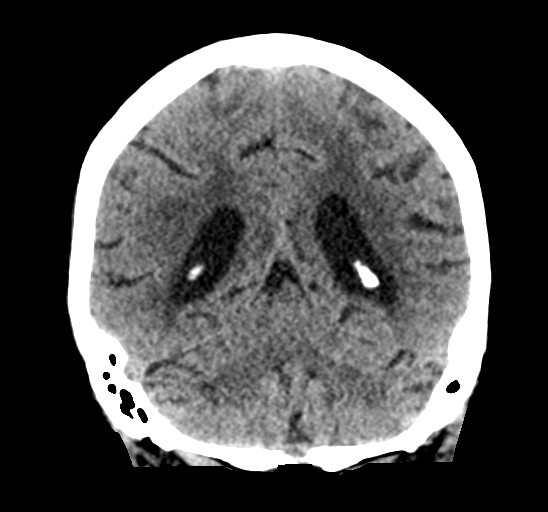
[im 30/67  brain]
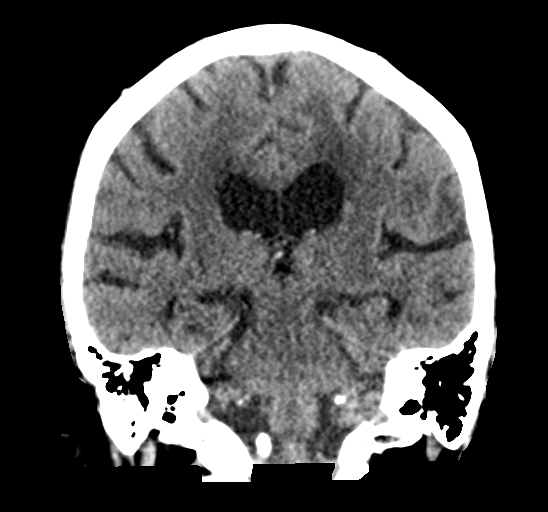
[im 37/67  brain]
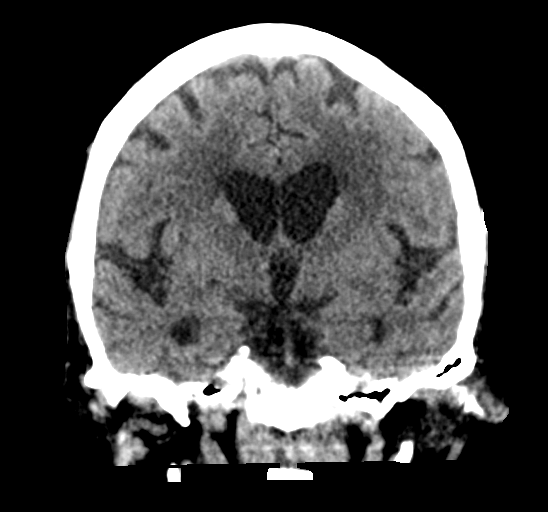

[Series 6: head without sag · sagittal · non-contrast · 0.34mm/px · 3 of 52 slices shown]
[im 18/52  brain]
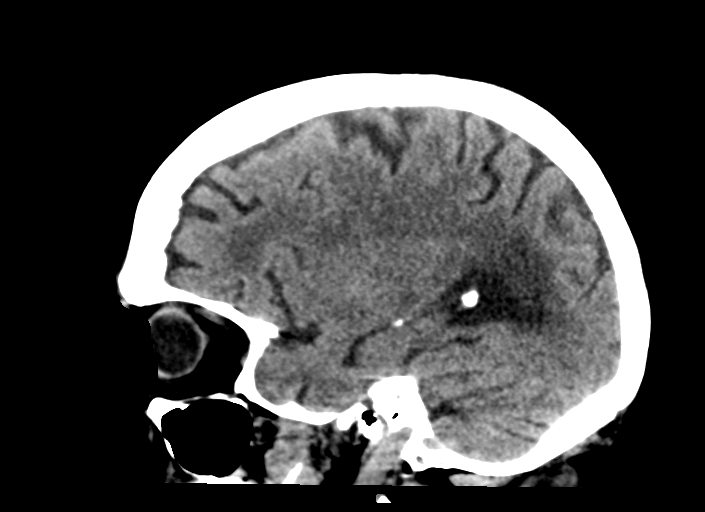
[im 26/52  brain]
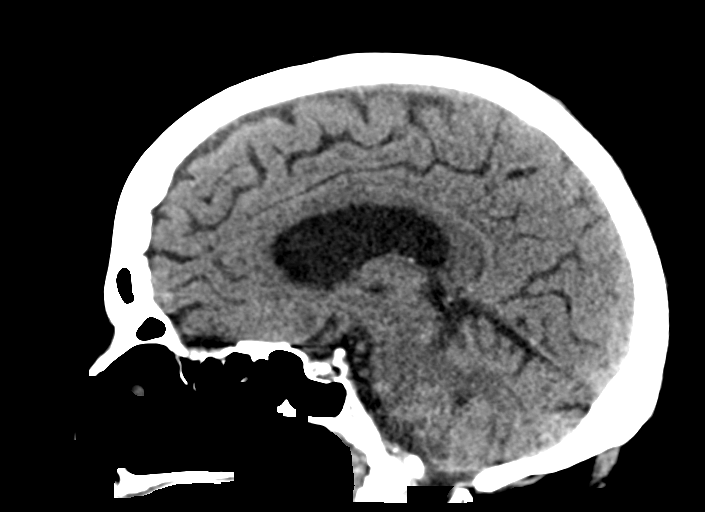
[im 35/52  brain]
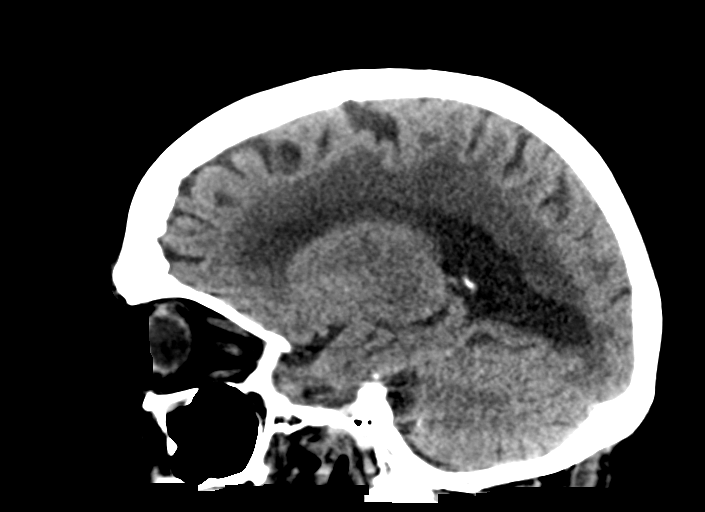

[16 of 47 positions shown; findings below may reference images not displayed]

FINDINGS: Brain: No evidence of acute infarction, hemorrhage, hydrocephalus,
extra-axial collection or mass lesion/mass effect. Chronic small
vessel ischemia with extensive gliosis in the cerebral white matter
and heterogeneous deep gray nuclei. Prominent sulcus in the
parasagittal left occipital lobe, question remote infarct in this
location.

Vascular: Atherosclerotic calcification

Skull: Negative for fracture. Partially covered advanced
degenerative disease at the craniocervical junction.

Sinuses/Orbits: No evidence of injury.  Left cataract resection
IMPRESSION: No acute finding.

Advanced chronic small vessel ischemia.

## 2018-01-17 IMAGING — DX DG CHEST 1V
1 series · 1 of 1 positions shown · non-contrast
Comparison: [DATE]

CLINICAL DATA: Fall.  Pain

EXAM:
CHEST  1 VIEW

[chest ap]
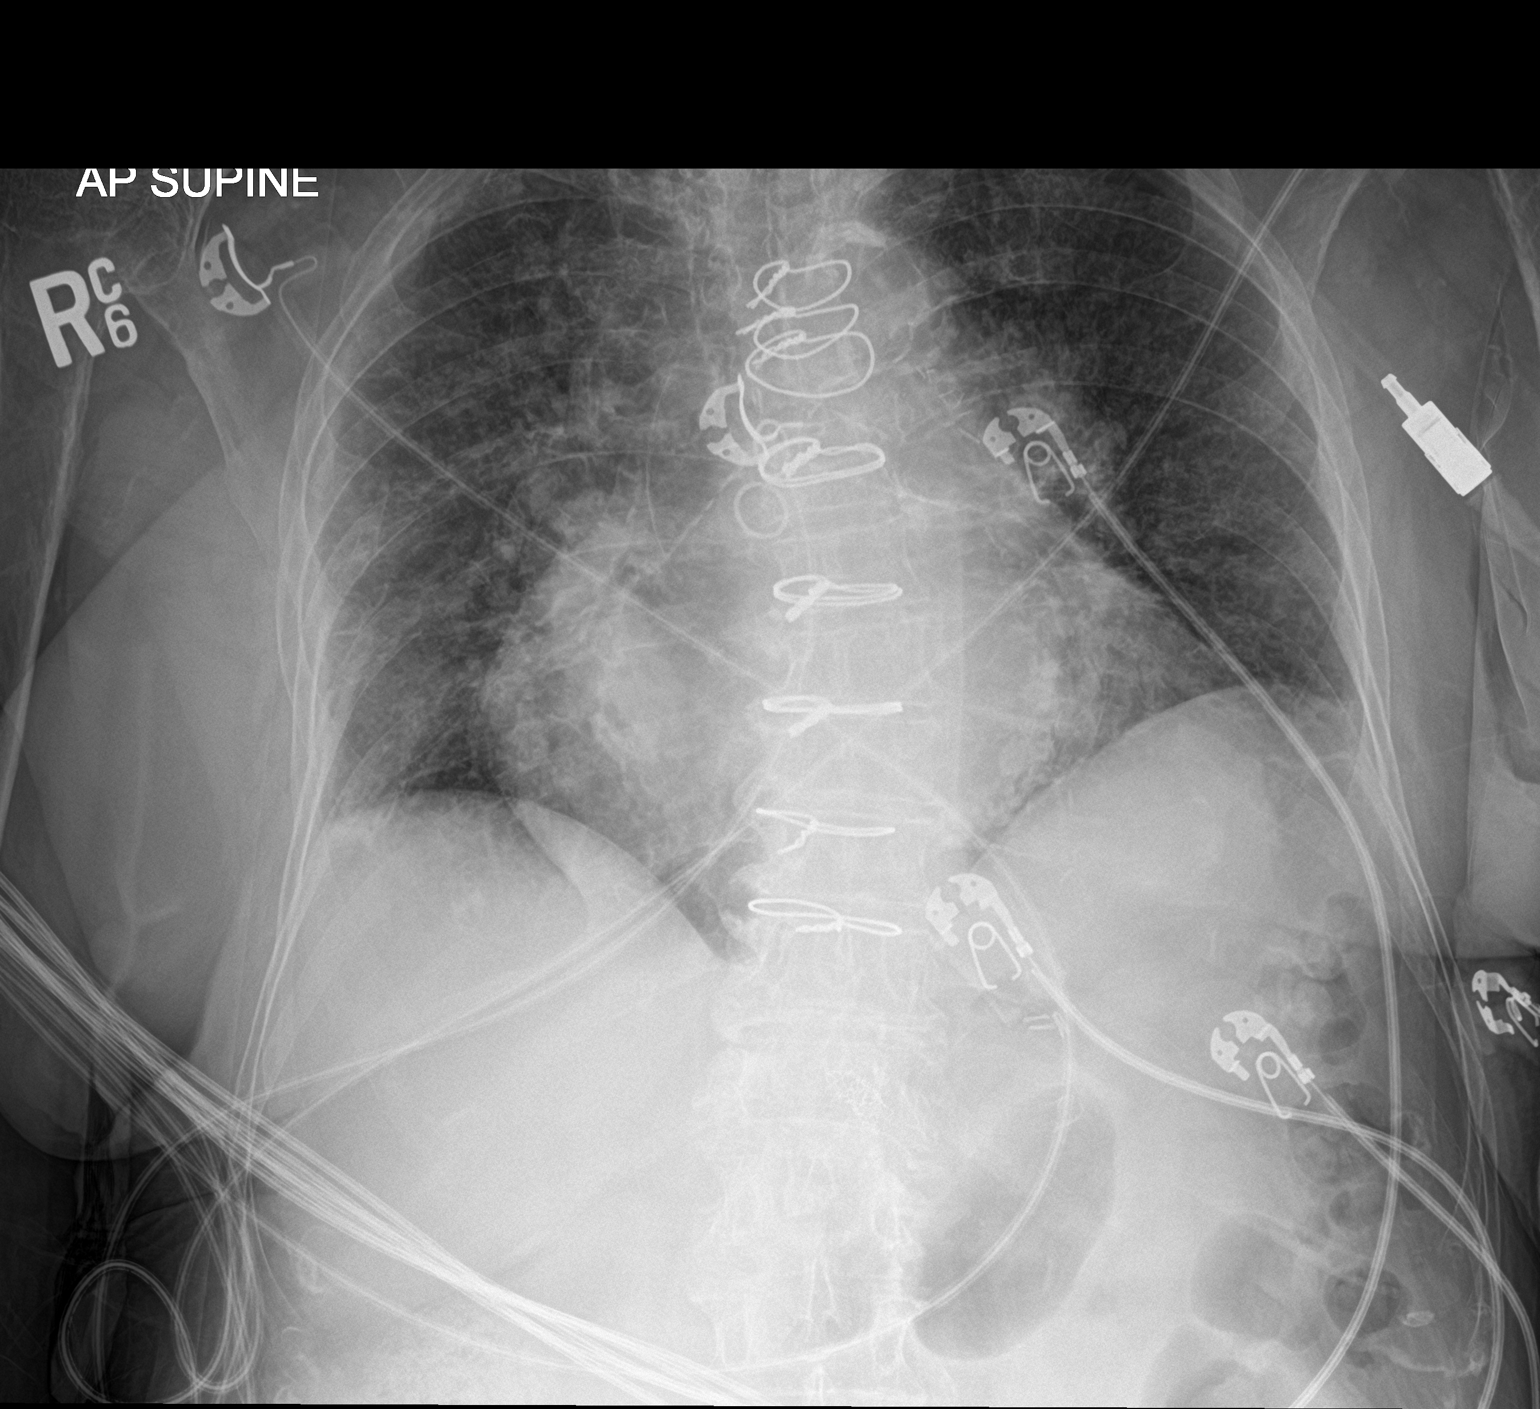

[1 of 1 positions shown; findings below may reference images not displayed]

FINDINGS: Postop CABG. Mild cardiac enlargement without heart failure.
Hypoventilation. Decreased lung volume with mild atelectasis in the
bases. Negative for pneumonia or effusion
IMPRESSION: Mild bibasilar atelectasis.  Negative for pneumonia

## 2018-01-17 IMAGING — DX DG HIP (WITH OR WITHOUT PELVIS) 2-3V*R*
3 series · 3 of 3 positions shown · non-contrast
Comparison: None.

CLINICAL DATA: Fall, right hip pain

EXAM:
DG HIP (WITH OR WITHOUT PELVIS) 2-3V RIGHT

[pelvis ap]
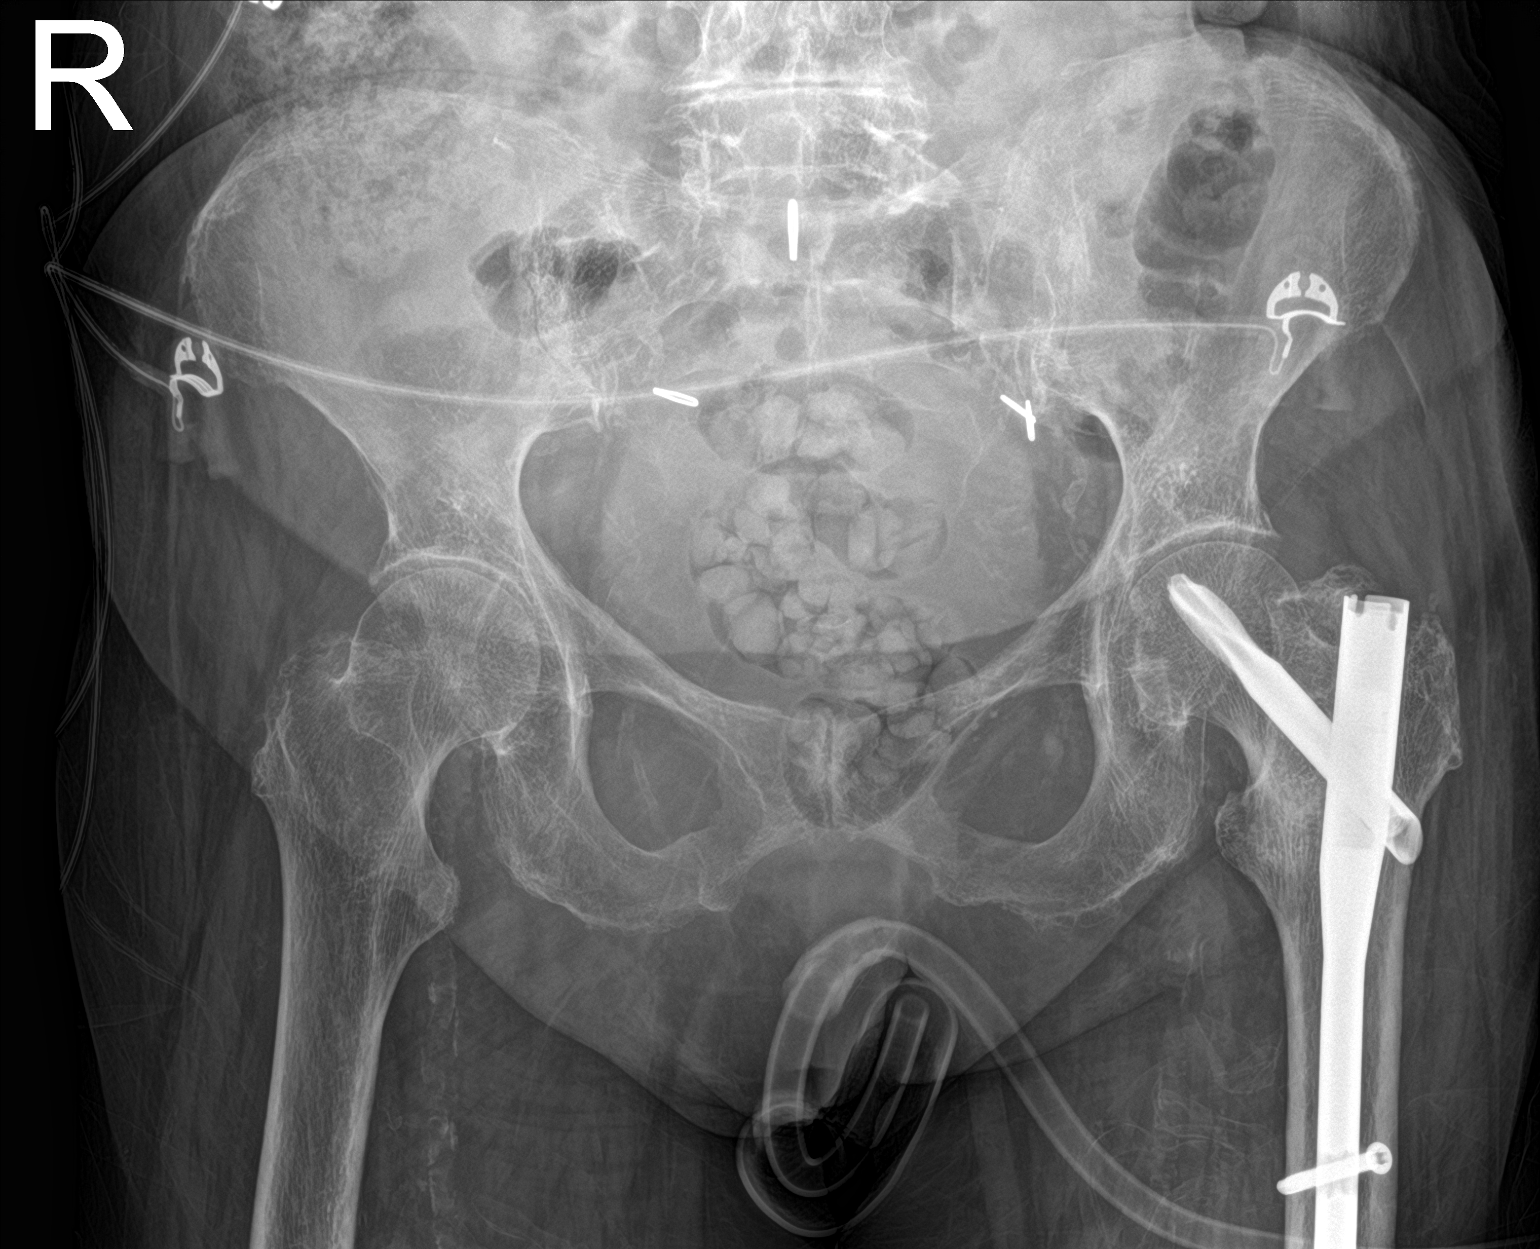

[hip ap]
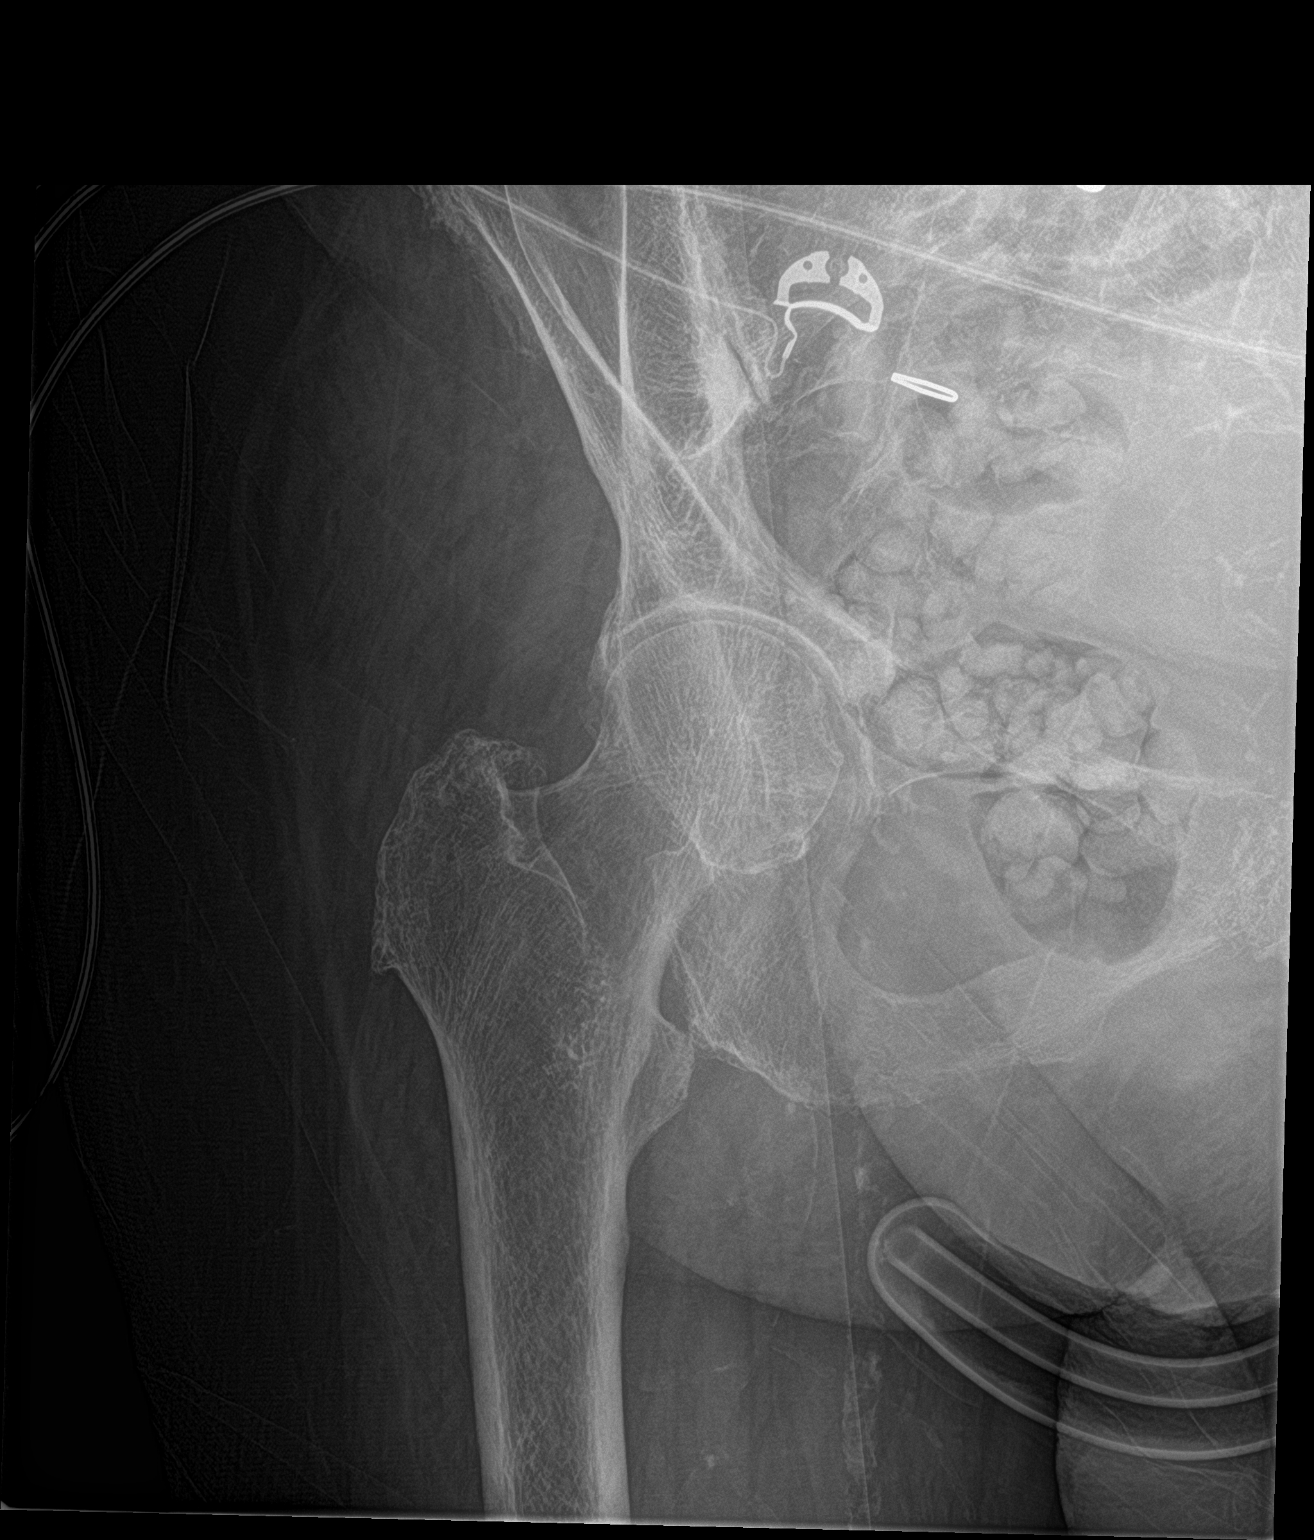

[hip lat]
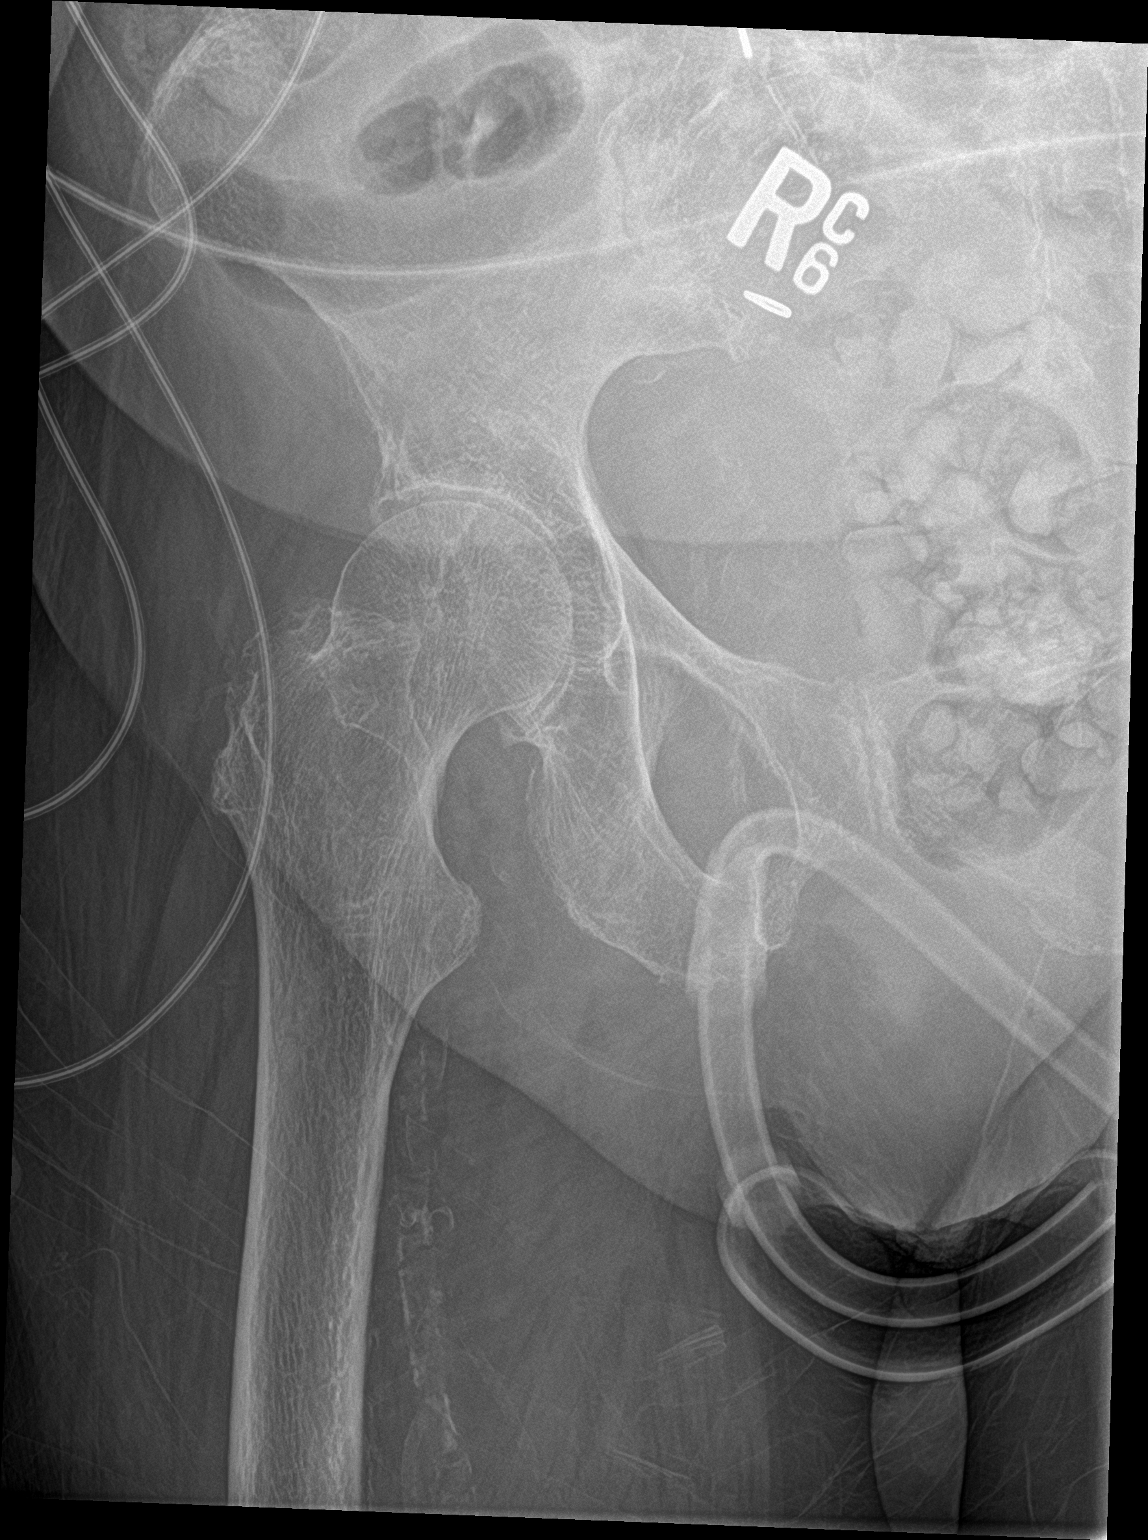

[3 of 3 positions shown; findings below may reference images not displayed]

FINDINGS: Right hip joint shows mild joint space narrowing. Negative for
fracture or AVN. No pelvic fracture

Chronic fracture left femur with surgical fixation

External urinary catheter.
IMPRESSION: Negative for acute fracture.

## 2018-01-17 MED ORDER — FOLIC ACID 1 MG PO TABS
1.0000 mg | ORAL_TABLET | Freq: Every day | ORAL | Status: DC
  Administered 2018-01-18 – 2018-01-19 (×2): 1 mg via ORAL
  Filled 2018-01-17 (×2): qty 1

## 2018-01-17 MED ORDER — DOCUSATE SODIUM 100 MG PO CAPS
100.0000 mg | ORAL_CAPSULE | Freq: Two times a day (BID) | ORAL | Status: DC
  Administered 2018-01-17 – 2018-01-19 (×4): 100 mg via ORAL
  Filled 2018-01-17 (×4): qty 1

## 2018-01-17 MED ORDER — MORPHINE SULFATE (PF) 4 MG/ML IV SOLN
4.0000 mg | INTRAVENOUS | Status: DC | PRN
  Administered 2018-01-17: 4 mg via INTRAVENOUS
  Filled 2018-01-17: qty 1

## 2018-01-17 MED ORDER — APIXABAN 2.5 MG PO TABS
2.5000 mg | ORAL_TABLET | Freq: Two times a day (BID) | ORAL | Status: DC
  Administered 2018-01-17 – 2018-01-18 (×3): 2.5 mg via ORAL
  Filled 2018-01-17 (×3): qty 1

## 2018-01-17 MED ORDER — SODIUM CHLORIDE 0.9 % IV SOLN
1.0000 g | Freq: Three times a day (TID) | INTRAVENOUS | Status: DC
  Administered 2018-01-17 – 2018-01-19 (×6): 1 g via INTRAVENOUS
  Filled 2018-01-17 (×7): qty 1

## 2018-01-17 MED ORDER — TRAZODONE HCL 50 MG PO TABS: 25.0000 mg | ORAL_TABLET | Freq: Every evening | ORAL | Status: DC | PRN

## 2018-01-17 MED ORDER — ACETAMINOPHEN 325 MG PO TABS: 650.0000 mg | ORAL_TABLET | Freq: Four times a day (QID) | ORAL | Status: DC | PRN

## 2018-01-17 MED ORDER — PRAVASTATIN SODIUM 40 MG PO TABS
40.0000 mg | ORAL_TABLET | Freq: Every day | ORAL | Status: DC
  Administered 2018-01-18 – 2018-01-19 (×2): 40 mg via ORAL
  Filled 2018-01-17 (×2): qty 1

## 2018-01-17 MED ORDER — SODIUM CHLORIDE 0.9 % IV BOLUS
1000.0000 mL | Freq: Once | INTRAVENOUS | Status: AC
  Administered 2018-01-17: 1000 mL via INTRAVENOUS

## 2018-01-17 MED ORDER — METHOTREXATE 2.5 MG PO TABS
20.0000 mg | ORAL_TABLET | ORAL | Status: DC
  Administered 2018-01-19: 20 mg via ORAL
  Filled 2018-01-17: qty 8

## 2018-01-17 MED ORDER — ACETAMINOPHEN 650 MG RE SUPP: 650.0000 mg | Freq: Four times a day (QID) | RECTAL | Status: DC | PRN

## 2018-01-17 MED ORDER — APIXABAN 5 MG PO TABS: 5.0000 mg | ORAL_TABLET | Freq: Two times a day (BID) | ORAL | Status: DC

## 2018-01-17 MED ORDER — LEVOTHYROXINE SODIUM 50 MCG PO TABS
50.0000 ug | ORAL_TABLET | Freq: Every day | ORAL | Status: DC
  Administered 2018-01-18 – 2018-01-19 (×2): 50 ug via ORAL
  Filled 2018-01-17 (×2): qty 1

## 2018-01-17 MED ORDER — CLONAZEPAM 0.5 MG PO TABS
0.5000 mg | ORAL_TABLET | Freq: Every day | ORAL | Status: DC
Start: 2018-01-17 — End: 2018-01-19
  Administered 2018-01-17 – 2018-01-18 (×2): 0.5 mg via ORAL
  Filled 2018-01-17 (×2): qty 1

## 2018-01-17 MED ORDER — LACTATED RINGERS IV SOLN
INTRAVENOUS | Status: DC
  Administered 2018-01-17: 18:00:00 via INTRAVENOUS

## 2018-01-17 MED ORDER — FERROUS BISGLYCINATE CHELATE 15 MG PO TABS: 1.0000 | ORAL_TABLET | Freq: Two times a day (BID) | ORAL | Status: DC

## 2018-01-17 MED ORDER — FE FUMARATE-B12-VIT C-FA-IFC PO CAPS
1.0000 | ORAL_CAPSULE | Freq: Two times a day (BID) | ORAL | Status: DC
  Administered 2018-01-18 – 2018-01-19 (×3): 1 via ORAL
  Filled 2018-01-17 (×5): qty 1

## 2018-01-17 MED ORDER — TRAMADOL HCL 50 MG PO TABS
50.0000 mg | ORAL_TABLET | Freq: Two times a day (BID) | ORAL | Status: DC
  Administered 2018-01-17 – 2018-01-19 (×3): 50 mg via ORAL
  Filled 2018-01-17 (×4): qty 1

## 2018-01-17 MED ORDER — METOPROLOL SUCCINATE ER 50 MG PO TB24
50.0000 mg | ORAL_TABLET | Freq: Every day | ORAL | Status: DC
  Administered 2018-01-18 – 2018-01-19 (×2): 50 mg via ORAL
  Filled 2018-01-17 (×2): qty 1

## 2018-01-17 MED ORDER — ONDANSETRON HCL 4 MG/2ML IJ SOLN: 4.0000 mg | Freq: Four times a day (QID) | INTRAMUSCULAR | Status: DC | PRN

## 2018-01-17 MED ORDER — DILTIAZEM HCL ER COATED BEADS 180 MG PO CP24
180.0000 mg | ORAL_CAPSULE | Freq: Every day | ORAL | Status: DC
  Administered 2018-01-17 – 2018-01-18 (×2): 180 mg via ORAL
  Filled 2018-01-17 (×2): qty 1

## 2018-01-17 MED ORDER — ONDANSETRON HCL 4 MG PO TABS: 4.0000 mg | ORAL_TABLET | Freq: Four times a day (QID) | ORAL | Status: DC | PRN

## 2018-01-17 MED ORDER — INSULIN ASPART 100 UNIT/ML ~~LOC~~ SOLN
0.0000 [IU] | Freq: Three times a day (TID) | SUBCUTANEOUS | Status: DC
  Administered 2018-01-18: 2 [IU] via SUBCUTANEOUS
  Administered 2018-01-18: 1 [IU] via SUBCUTANEOUS
  Administered 2018-01-19: 2 [IU] via SUBCUTANEOUS

## 2018-01-17 MED ORDER — VITAMIN B-12 1000 MCG PO TABS
1000.0000 ug | ORAL_TABLET | Freq: Every day | ORAL | Status: DC
  Administered 2018-01-18 – 2018-01-19 (×2): 1000 ug via ORAL
  Filled 2018-01-17 (×2): qty 1

## 2018-01-17 NOTE — H&P (Signed)
History and Physical    [name] ATF:[id number] DOB: 09/11/1929 DOA: 01/17/2018  PCP: Rodolph Bong, MD Consultants:  [facility] - rheumatology; [facility] - nephrology; has initial appointment with urology; [facility] - podiatry; [name] - cardiology Patient coming from: [facility]; NOK: Daughter, [phone]  Chief Complaint: Fall  HPI: [name] is a 84 y.o. female with medical history significant of hypothyroidism; PVD; mesenteric ischemia; HTN; HLD; DM; CHF; CAD; and afib presenting with fall. She fell last night, has been disoriented and confused.  She saw PCP Monday and was started on an antibiotic but her daughter is concerned that she was already advanced before it was started.  Sunday, she had urinary frequency and was in bed all day.  She also had a bit of dysuria.  She was weak and a little confused but a little better yesterday, with mild dizziness.  Uncertain about why she fell.  She was last seen at 9PM and was fine last night.  She was found this AM about 9 and she was in the floor.  She states that she feels fine now, not dizzy or lightheaded.  Her daughter reports mild confusion but overall doing some better.  ED Course:  Fall, laid in the floor all night.  She was started on Bactrim for UTI Monday.  Generally weak, trying to hydrate.  Knee pain, xrays negative.  Head CT ok.  Mild AKI, creatinine 1.9.  IV Aztreonam for +UCx from Monday positive for gram negative bacillus - but sensitive to Bactrim.  Given IVF.  Likely ok for Obs, Med Surg.  Review of Systems: As per HPI; otherwise review of systems reviewed and negative.   Ambulatory Status:  Ambulates with a walker  Past Medical History:  Diagnosis Date  . Acute pancreatitis   . Anemia   . Arthritis   . Atrial fibrillation (HCC)   . CAD (coronary artery disease)   . Carotid artery occlusion   . CHF (congestive heart failure) (HCC)   . Diabetes mellitus age 78  . DJD (degenerative joint disease)    . DVT (deep venous thrombosis) (HCC)   . GERD (gastroesophageal reflux disease)   . GI bleed   . Hyperlipidemia   . Hypertension   . Joint pain   . Mesenteric ischemia   . Peripheral vascular disease (HCC)   . Thyroid disease     Past Surgical History:  Procedure Laterality Date  . APPENDECTOMY    . CELIAC ARTERY STENT    . CORONARY ARTERY BYPASS GRAFT    . EMBOLECTOMY  2009   right leg  . FASCIOTOMY     right leg  . HIP FRACTURE SURGERY Left   . JOINT REPLACEMENT    . TOTAL KNEE ARTHROPLASTY     bilateral  . TUBAL LIGATION    . VISCERAL ANGIOGRAM N/A 05/31/2013   Procedure: MESSENTERIC [name];  Surgeon: [name], MD;  Location: [facility];  Service: Cardiovascular;  Laterality: N/A;    Social History   Socioeconomic History  . Marital status: Widowed    Spouse name: Not on file  . Number of children: Not on file  . Years of education: Not on file  . Highest education level: Not on file  Occupational History  . Not on file  Social Needs  . Financial resource strain: Not on file  . Food insecurity:    Worry: Not on file    Inability: Not on file  . Transportation needs:  Medical: Not on file    Non-medical: Not on file  Tobacco Use  . Smoking status: Never Smoker  . Smokeless tobacco: Never Used  Substance and Sexual Activity  . Alcohol use: No  . Drug use: No  . Sexual activity: Not on file  Lifestyle  . Physical activity:    Days per week: Not on file    Minutes per session: Not on file  . Stress: Not on file  Relationships  . Social connections:    Talks on phone: Not on file    Gets together: Not on file    Attends religious service: Not on file    Active member of club or organization: Not on file    Attends meetings of clubs or organizations: Not on file    Relationship status: Not on file  . Intimate partner violence:    Fear of current or ex partner: Not on file    Emotionally abused: Not on file    Physically abused: Not on  file    Forced sexual activity: Not on file  Other Topics Concern  . Not on file  Social History Narrative  . Not on file    Allergies  Allergen Reactions  . Cefuroxime Anaphylaxis and Other (See Comments)    Throat swelling  . Fish Allergy Shortness Of Breath    Pt said she has throat swelling   . Ciprofloxacin Rash  . Codeine Other (See Comments)    Jittery Jittery  . Sertraline Hcl Rash  . Cephalexin Rash    Family History  Problem Relation Age of Onset  . Cancer Mother        ovarian  . Other Father        suicide  . Coronary artery disease Sister   . Other Sister        alzheimers  . Heart disease Sister        Before age 39  . Diabetes Sister   . Cancer Daughter        spinal tumor  . Diabetes Daughter   . Hypertension Daughter   . Diabetes Son   . Hyperlipidemia Son   . Hypertension Son     Prior to Admission medications   Medication Sig Start Date End Date Taking? Authorizing Provider  apixaban (ELIQUIS) 5 MG TABS tablet Take 1 tablet (5 mg total) by mouth 2 (two) times daily. 11/21/17  Yes Lewayne Bunting, MD  Calcium Carb-Cholecalciferol 600-800 MG-UNIT TABS TAKE ONE TABLET BY MOUTH 2 TIMES A DAY Patient taking differently: Take 1 tablet by mouth 2 (two) times daily.  09/11/15  Yes Rodolph Bong, MD  clonazePAM (KLONOPIN) 0.5 MG tablet TAKE 1 TABLET (0.5 MG TOTAL) BY MOUTH AT BEDTIME. 12/08/17  Yes Rodolph Bong, MD  Cranberry 500 MG CAPS Take 500 mg by mouth every morning.   Yes [provider]  diltiazem (CARDIZEM CD) 180 MG 24 hr capsule Take 1 capsule (180 mg total) by mouth at bedtime. 05/24/17  Yes Rodolph Bong, MD  Ergocalciferol (VITAMIN D2) 2000 units TABS Take 2,000 Units by mouth daily.    Yes [provider]  Ferrous Bisglycinate Chelate 15 MG TABS Take 1 tablet by mouth 2 (two) times daily. 10/20/17  Yes Rodolph Bong, MD  folic acid (FOLVITE) 1 MG tablet Take 1 tablet (1 mg total) by mouth daily. 05/24/17  Yes Rodolph Bong,  MD  levothyroxine (SYNTHROID, LEVOTHROID) 50 MCG tablet Take 1 tablet (50  mcg total) by mouth daily. 05/24/17  Yes Rodolph Bong, MD  methotrexate (RHEUMATREX) 2.5 MG tablet Take 20 mg by mouth every Friday. Caution:Chemotherapy. Protect from light.   Yes [provider]  metoprolol succinate (TOPROL-XL) 50 MG 24 hr tablet Take 1 tablet (50 mg total) by mouth daily. 05/24/17  Yes Rodolph Bong, MD  pravastatin (PRAVACHOL) 40 MG tablet TAKE 1 TABLET (40 MG TOTAL) BY MOUTH DAILY. 01/03/18  Yes Rodolph Bong, MD  PREMARIN vaginal cream PLACE 1 APPLICATORFUL VAGINALLY ONCE A DAY Patient taking differently: Place 1 Applicatorful vaginally daily.  12/08/17  Yes Rodolph Bong, MD  STOOL SOFTENER 100 MG capsule TAKE 1 CAPSULE BY MOUTH 2 TIMES A DAY (AM & PM) Patient taking differently: Take 100 mg by mouth 2 (two) times daily.  09/11/15  Yes Rodolph Bong, MD  sulfamethoxazole-trimethoprim (BACTRIM DS,SEPTRA DS) 800-160 MG tablet Take 1 tablet by mouth 2 (two) times daily. 01/15/18  Yes Rodolph Bong, MD  torsemide (DEMADEX) 100 MG tablet Take 0.5 tablets (50 mg total) by mouth 2 (two) times daily. 05/24/17  Yes Rodolph Bong, MD  traMADol (ULTRAM) 50 MG tablet TAKE 1 TABLET BY MOUTH TWO TIMES A DAY AS NEEDED (BOTTLES) Patient taking differently: Take 50 mg by mouth 2 (two) times daily.  12/08/17  Yes Rodolph Bong, MD  traZODone (DESYREL) 50 MG tablet Take 0.5-1 tablets (25-50 mg total) by mouth at bedtime as needed for sleep. 12/01/16  Yes Rodolph Bong, MD  vitamin B-12 (CYANOCOBALAMIN) 1000 MCG tablet Take 1,000 mcg by mouth daily.   Yes [provider]  AMBULATORY NON FORMULARY MEDICATION Please check INR weekly for 2 months. Send reports to Dr Denyse Amass fax [phone] 12/19/16   Rodolph Bong, MD  glucose blood test strip Once daily E11.29 03/03/15   Rodolph Bong, MD  leflunomide (ARAVA) 10 MG tablet Take 1 tablet (10 mg total) by mouth at bedtime. Patient not taking: Reported on 01/17/2018  05/24/17   Rodolph Bong, MD  PARoxetine (PAXIL) 10 MG tablet Take 1 tablet (10 mg total) by mouth daily. 01/16/18   Rodolph Bong, MD    Physical Exam: Vitals:   01/17/18 1215 01/17/18 1230 01/17/18 1245 01/17/18 1315  BP: (!) 153/64 (!) 151/69 (!) 155/71 133/77  Pulse: 76 79 70 62  Resp: 11 14 13 12   SpO2: 98% 99% 99% 98%     General:  Appears calm and comfortable and is NAD Eyes:  PERRL, EOMI, normal lids, iris ENT:  grossly normal hearing, lips & tongue, mmm; artificial dentition Neck:  no LAD, masses or thyromegaly Cardiovascular:   Irregularly irregular, rate controlled, no m/r/g.  Respiratory:   CTA bilaterally with no wheezes/rales/rhonchi.  Normal respiratory effort. Abdomen:  soft, NT, ND, NABS Skin: Mild diffuse but patchy erythema with edema of the RLE from mid-calf distally and the foot Musculoskeletal:  grossly normal tone BUE/BLE, good ROM, no bony abnormality Lower extremity:    Limited foot exam with no ulcerations.  2+ distal pulses. Psychiatric:  blunted mood and affect, speech sparse Neurologic:  CN 2-12 grossly intact, moves all extremities in coordinated fashion, sensation intact    Radiological Exams on Admission: Dg Chest 1 View  Result Date: 01/17/2018 CLINICAL DATA:  Fall.  Pain EXAM: CHEST  1 VIEW COMPARISON:  02/14/2017 FINDINGS: Postop CABG. Mild cardiac enlargement without heart failure. Hypoventilation. Decreased lung volume with mild atelectasis in the bases. Negative for pneumonia or effusion  IMPRESSION: Mild bibasilar atelectasis.  Negative for pneumonia Electronically Signed   By: Marlan Palau M.D.   On: 01/17/2018 11:57   Ct Head Wo Contrast  Result Date: 01/17/2018 CLINICAL DATA:  Altered level of consciousness. Found on floor. Recent fall EXAM: CT HEAD WITHOUT CONTRAST TECHNIQUE: Contiguous axial images were obtained from the base of the skull through the vertex without intravenous contrast. COMPARISON:  None. FINDINGS: Brain: No evidence of  acute infarction, hemorrhage, hydrocephalus, extra-axial collection or mass lesion/mass effect. Chronic small vessel ischemia with extensive gliosis in the cerebral white matter and heterogeneous deep gray nuclei. Prominent sulcus in the parasagittal left occipital lobe, question remote infarct in this location. Vascular: Atherosclerotic calcification Skull: Negative for fracture. Partially covered advanced degenerative disease at the craniocervical junction. Sinuses/Orbits: No evidence of injury.  Left cataract resection IMPRESSION: No acute finding. Advanced chronic small vessel ischemia. Electronically Signed   By: Marnee Spring M.D.   On: 01/17/2018 12:23   Dg Knee Complete 4 Views Right  Result Date: 01/17/2018 CLINICAL DATA:  Fall.  Pain EXAM: RIGHT KNEE - COMPLETE 4+ VIEW COMPARISON:  None. FINDINGS: Total knee replacement in satisfactory position. No fracture identified. Mild arterial calcification. IMPRESSION: Negative for fracture Electronically Signed   By: Marlan Palau M.D.   On: 01/17/2018 11:58   Dg Hip Unilat With Pelvis 2-3 Views Right  Result Date: 01/17/2018 CLINICAL DATA:  Fall, right hip pain EXAM: DG HIP (WITH OR WITHOUT PELVIS) 2-3V RIGHT COMPARISON:  None. FINDINGS: Right hip joint shows mild joint space narrowing. Negative for fracture or AVN. No pelvic fracture Chronic fracture left femur with surgical fixation External urinary catheter. IMPRESSION: Negative for acute fracture. Electronically Signed   By: Marlan Palau M.D.   On: 01/17/2018 11:55    EKG: Independently reviewed.  Afib with rate 95; LVH; nonspecific ST changes with no evidence of acute ischemia; NSCSLT   Labs on Admission: I have personally reviewed the available labs and imaging studies at the time of the admission.  Pertinent labs:   Na++ 132 Glucose 172 BUN 30/Creatinine 1.95/GFR 22; prior 20/1.49/32 on 6/13 Hgb 10.9 - stable INR 2.28 Urine culture from 10/7: Klebsiella oxytoca, resistant to Amp  (but not Augmentin), Cefazolin - otherwise pan-sensitive  Assessment/Plan Principal Problem:   AKI (acute kidney injury) (HCC) Active Problems:   Hypothyroidism   ATRIAL FIBRILLATION   Chronic diastolic heart failure (HCC)   Rheumatoid arthritis (HCC)   Diabetes mellitus with renal complications (HCC)   Frequent UTI   CKD (chronic kidney disease) stage 3, GFR 30-59 ml/min (HCC)   Hyperlipidemia   Hypertension   AKI -Patient with apparent fall sometime overnight, found in the floor this AM -Found to have AKI on stage 3 CKD -Will observe overnight with rehydration and recheck BMP in AM -Weakness and confusion on presentation are thought to be related to this and so would be expected to improve with rehydration; if not, she needs further evaluation -PT evaluation today -Anticipate d/c to home tomorrow if no new issues develop  Frequent UTI -Patient was seen Monday and had urine culture that is + for Klebsiella -She was started on Bactrim at that time -She has cephalosporin and Cipro allergies/sensitivities -Per discussion with pharmacy, she was started on Aztreonam in the ER -Will continue Aztreonam for now with probable transition to PO antibiotics tomorrow - possibly not Bactrim given her fall  Afib -Rate controlled on Cardizem, Toprol XL -Continue Eliquis for [facility]  Chronic heart failure -Not clearly  documented - the only echo in Epic including Care Everywhere was on 11/18/16 and showed a preserved EF and was unable to evaluate diastolic function due to afib -She appears to be compensated, regardless -Hold diuretics given AKI and need for IVF  DM -Recent A1c was 6.6 -She does not appear to be taking medication for this issue at this time -Cover with sensitive-scale SSI  HTN -Continue Cardizem, Toprol XL  HLD -Continue Pravachol  Hypothyroidism -Check TSH -Continue Synthroid at current dose for now  RA -Continue Methotrexate  DVT prophylaxis:  Eliquis Code  Status:  DNR - confirmed with patient/family Family Communication: Daughter present throughout evaluation  Disposition Plan:  Home once clinically improved Consults called: PT  Admission status: It is my clinical opinion that referral for OBSERVATION is reasonable and necessary in this patient based on the above information provided. The aforementioned taken together are felt to place the patient at high risk for further clinical deterioration. However it is anticipated that the patient may be medically stable for discharge from the hospital within 24 to 48 hours.    Jonah Blue MD [facility]  If note is complete, please contact covering daytime or nighttime physician. [email or web address] Password [email or web address]  01/17/2018, 1:41 PM

## 2018-01-17 NOTE — ED Notes (Signed)
Pt taken to vascular study.

## 2018-01-17 NOTE — ED Notes (Signed)
Pt placed on bed pan, had about 200 ml of output.

## 2018-01-17 NOTE — ED Notes (Signed)
PT/OT at the bedside.

## 2018-01-17 NOTE — ED Notes (Signed)
ADmitting at bedside 

## 2018-01-17 NOTE — ED Notes (Signed)
Patient transported to X-ray 

## 2018-01-17 NOTE — ED Notes (Signed)
CBG - 173 

## 2018-01-17 NOTE — Progress Notes (Signed)
*  Preliminary Results* Right lower extremity venous duplex completed. Right lower extremity is negative for deep vein thrombosis. There is no evidence of right Baker's cyst.  01/17/2018 4:04 PM  [name]

## 2018-01-17 NOTE — ED Triage Notes (Signed)
Per EMS: Pt lives in an independent living facility. Was found on the floor this morning in front of her chair. Pt had wet her pants. Pt unsure how long she was on the floor. Pt complaining of right hip, leg, knee pain. Pt recently treated for a UTI, foul smelling urine is present. Pt is on blood thinner, eliquis. PT is a poor historian.  Pt told this RN that she slept on the floor last night, doesn't remember eating dinner last night.

## 2018-01-17 NOTE — ED Notes (Signed)
Daughter states that the pt gets her night meds around  9 PM.

## 2018-01-17 NOTE — Evaluation (Signed)
Physical Therapy Evaluation Patient Details Name: [name] MRN: [id number] DOB: 15-Dec-1929 Today's Date: 01/17/2018   History of Present Illness  Pt is a 84 y.o. F with significant PMH of hypothyroidism, PVD, mesenteric ischemia, HTN, HLD, DM, CHF, CAD, and afib who presents after a fall. CT head negative acute abnormality. Imaging negative for acute fracture.   Clinical Impression  Pt admitted with above diagnosis. Pt currently with functional limitations due to the deficits listed below (see PT Problem List). Prior to admission, patient living at ILF with HHPT. Uses a Rollator for mobility and independent with ADL's. Pt daughter reports patient is not at cognitive baseline. Currently, patient presenting with decreased functional mobility secondary to decreased gait speed, cognitive deficits, balance deficits, and decreased endurance. Performing transfers with min assist and ambulating in room with Rollator and min guard assist. Recommend trialing RW for safety during next session. Extensive discussion with pt daughter on PT recommendation for SNF due to patient's high risk of falling; pt daughter would like pt to be reassessed in morning for potential to return to ILF with HHPT. Pt will benefit from skilled PT to increase their independence and safety with mobility to allow discharge to the venue listed below.       Follow Up Recommendations Supervision/Assistance - 24 hour;SNF    Equipment Recommendations  None recommended by PT    Recommendations for Other Services       Precautions / Restrictions Precautions Precautions: Fall Restrictions Weight Bearing Restrictions: No      Mobility  Bed Mobility Overal bed mobility: Needs Assistance Bed Mobility: Supine to Sit     Supine to sit: Mod assist     General bed mobility comments: Mod assist to elevate trunk to sitting  Transfers Overall transfer level: Needs assistance Equipment used: 4-wheeled walker Transfers: Sit  to/from Stand Sit to Stand: Min assist         General transfer comment: Min assist to initiate standing and cues for hand placement  Ambulation/Gait Ambulation/Gait assistance: Min guard Gait Distance (Feet): 20 Feet Assistive device: 4-wheeled walker Gait Pattern/deviations: Step-through pattern;Decreased stride length;Trunk flexed;Decreased dorsiflexion - right;Decreased dorsiflexion - left Gait velocity: decr Gait velocity interpretation: <1.31 ft/sec, indicative of household ambulator General Gait Details: Increased lateral lean which pt daughter states is baseline due to chronic shoulder pain. Cues for safety with maintaining proximity and positioning with walker in addition to max directional cueing.   Stairs            Wheelchair Mobility    Modified Rankin (Stroke Patients Only)       Balance Overall balance assessment: Needs assistance Sitting-balance support: No upper extremity supported;Feet supported Sitting balance-Leahy Scale: Fair       Standing balance-Leahy Scale: Poor                               Pertinent Vitals/Pain Pain Assessment: No/denies pain    Home Living Family/patient expects to be discharged to:: Private residence Living Arrangements: Alone Available Help at Discharge: Available PRN/intermittently;Family Type of Home: Independent living facility Home Access: Level entry     Home Layout: One level Home Equipment: Walker - 4 wheels;Walker - 2 wheels      Prior Function Level of Independence: Independent with assistive device(s)         Comments: uses Rollator for mobility     Hand Dominance        Extremity/Trunk Assessment  Upper Extremity Assessment Upper Extremity Assessment: Generalized weakness;RUE deficits/detail RUE Deficits / Details: Impaired grip strength due to RA and difficulty holding onto walker    Lower Extremity Assessment Lower Extremity Assessment: Generalized weakness     Cervical / Trunk Assessment Cervical / Trunk Assessment: Kyphotic  Communication   Communication: No difficulties  Cognition Arousal/Alertness: Awake/alert Behavior During Therapy: WFL for tasks assessed/performed Overall Cognitive Status: Impaired/Different from baseline Area of Impairment: Following commands;Awareness;Problem solving;Orientation;Attention                 Orientation Level: Disoriented to;Place;Time Current Attention Level: Sustained   Following Commands: Follows one step commands inconsistently;Follows one step commands with increased time   Awareness: Emergent Problem Solving: Slow processing;Decreased initiation;Difficulty sequencing;Requires verbal cues;Requires tactile cues General Comments: Slow processing      General Comments General comments (skin integrity, edema, etc.): VSS    Exercises     Assessment/Plan    PT Assessment Patient needs continued PT services  PT Problem List Decreased strength;Decreased range of motion;Decreased activity tolerance;Decreased balance;Decreased mobility;Decreased cognition;Decreased safety awareness       PT Treatment Interventions DME instruction;Gait training;Functional mobility training;Therapeutic activities;Therapeutic exercise;Balance training;Patient/family education;Cognitive remediation    PT Goals (Current goals can be found in the Care Plan section)  Acute Rehab PT Goals Patient Stated Goal: none stated by patient; pt daughter would like pt to go to ILF with HHPT PT Goal Formulation: With patient/family Time For Goal Achievement: 01/31/18 Potential to Achieve Goals: Good    Frequency Min 3X/week   Barriers to discharge        Co-evaluation               AM-PAC PT "6 Clicks" Daily Activity  Outcome Measure Difficulty turning over in bed (including adjusting bedclothes, sheets and blankets)?: A Little Difficulty moving from lying on back to sitting on the side of the bed? :  Unable Difficulty sitting down on and standing up from a chair with arms (e.g., wheelchair, bedside commode, etc,.)?: Unable Help needed moving to and from a bed to chair (including a wheelchair)?: A Little Help needed walking in hospital room?: A Little Help needed climbing 3-5 steps with a railing? : A Lot 6 Click Score: 13    End of Session Equipment Utilized During Treatment: Gait belt Activity Tolerance: Patient tolerated treatment well Patient left: in bed;with call bell/phone within reach;with family/visitor present Nurse Communication: Mobility status PT Visit Diagnosis: Unsteadiness on feet (R26.81);Other abnormalities of gait and mobility (R26.89);Muscle weakness (generalized) (M62.81);History of falling (Z91.81);Difficulty in walking, not elsewhere classified (R26.2)    Time: 8563-1497 PT Time Calculation (min) (ACUTE ONLY): 41 min   Charges:   PT Evaluation $PT Eval Moderate Complexity: 1 Mod PT Treatments $Gait Training: 8-22 mins $Therapeutic Activity: 8-22 mins       Laurina Bustle, PT, DPT Acute Rehabilitation Services Pager [phone] Office [phone]   Vanetta Mulders 01/17/2018, 4:27 PM

## 2018-01-17 NOTE — Progress Notes (Signed)
New Admission Note:   Arrival Method: Stretcher from ED Mental Orientation: Alert and oriented x4  Telemetry: NA Assessment: Completed Skin: Intact IV: RAC Pain: Tubes: None Safety Measures: Safety Fall Prevention Plan has been discussed  Admission:  5 [place] Orientation: Patient has been orientated to the room, unit and staff.   Family: Daughter at bedside  Orders to be reviewed and implemented. Will continue to monitor the patient. Call light has been placed within reach and bed alarm has been activated.   Gladstone Pih, RN

## 2018-01-18 DIAGNOSIS — N179 Acute kidney failure, unspecified: Secondary | ICD-10-CM | POA: Diagnosis not present

## 2018-01-18 DIAGNOSIS — W19XXXA Unspecified fall, initial encounter: Secondary | ICD-10-CM

## 2018-01-18 DIAGNOSIS — R531 Weakness: Secondary | ICD-10-CM | POA: Diagnosis not present

## 2018-01-18 DIAGNOSIS — I480 Paroxysmal atrial fibrillation: Secondary | ICD-10-CM

## 2018-01-18 LAB — GLUCOSE, CAPILLARY
GLUCOSE-CAPILLARY: 111 mg/dL — AB (ref 70–99)
Glucose-Capillary: 165 mg/dL — ABNORMAL HIGH (ref 70–99)
Glucose-Capillary: 150 mg/dL — ABNORMAL HIGH (ref 70–99)
Glucose-Capillary: 154 mg/dL — ABNORMAL HIGH (ref 70–99)

## 2018-01-18 LAB — BASIC METABOLIC PANEL
Anion gap: 9 (ref 5–15)
BUN: 25 mg/dL — ABNORMAL HIGH (ref 8–23)
CALCIUM: 8.9 mg/dL (ref 8.9–10.3)
CHLORIDE: 99 mmol/L (ref 98–111)
CO2: 26 mmol/L (ref 22–32)
Creatinine, Ser: 1.58 mg/dL — ABNORMAL HIGH (ref 0.44–1.00)
GFR calc non Af Amer: 29 mL/min — ABNORMAL LOW (ref >60)
GFR, EST AFRICAN AMERICAN: 33 mL/min — AB (ref >60)
GLUCOSE: 123 mg/dL — AB (ref 70–99)
POTASSIUM: 3.8 mmol/L (ref 3.5–5.1)
Sodium: 134 mmol/L — ABNORMAL LOW (ref 135–145)

## 2018-01-18 LAB — CBC
HCT: 31.6 % — ABNORMAL LOW (ref 36.0–46.0)
HEMOGLOBIN: 10.3 g/dL — AB (ref 12.0–15.0)
MCH: 32.2 pg (ref 26.0–34.0)
MCHC: 32.6 g/dL (ref 30.0–36.0)
MCV: 98.8 fL (ref 80.0–100.0)
NRBC: 0 % (ref 0.0–0.2)
PLATELETS: 335 10*3/uL (ref 150–400)
RBC: 3.2 MIL/uL — AB (ref 3.87–5.11)
RDW: 17.3 % — ABNORMAL HIGH (ref 11.5–15.5)
WBC: 3.9 10*3/uL — ABNORMAL LOW (ref 4.0–10.5)

## 2018-01-18 NOTE — Care Management Obs Status (Signed)
MEDICARE OBSERVATION STATUS NOTIFICATION   Patient Details  Name: [name] MRN: [id number] Date of Birth: February 04, 1930   Medicare Observation Status Notification Given:  Yes    Lawerance Sabal, RN 01/18/2018, 11:09 AM

## 2018-01-18 NOTE — Clinical Social Work Note (Signed)
Patient assessed and facility search initiated. Patient will discharge to [facility] on 01/19/18.  Genelle Bal, MSW, LCSW Licensed Clinical Social Worker Clinical Social Work Department [employer] [phone]

## 2018-01-18 NOTE — Care Management Note (Signed)
Case Management Note  Patient Details  Name: [name] MRN: [id number] Date of Birth: [birth year]  Subjective/Objective:                 Patient from ILF in [place]. UTI, fall.    Action/Plan:  CSW following for SNF workup.   Expected Discharge Date:                  Expected Discharge Plan:  Skilled Nursing Facility  In-House Referral:  Clinical Social Work  Discharge planning Services  CM Consult  Post Acute Care Choice:    Choice offered to:     DME Arranged:    DME Agency:     HH Arranged:    HH Agency:     Status of Service:  In process, will continue to follow  If discussed at Long Length of Stay Meetings, dates discussed:    Additional Comments:  Lawerance Sabal, RN 01/18/2018, 10:26 AM

## 2018-01-18 NOTE — NC FL2 (Signed)
[place] MEDICAID FL2 LEVEL OF CARE SCREENING TOOL     IDENTIFICATION  Patient Name: [name] Birthdate: [birth year] Sex: female Admission Date (Current Location): 01/17/2018  [facility] and [place] Number:  Producer, television/film/video and Address:  [facility], [address] [place], [place] [address]      Provider Number: [id number]  Attending Physician Name and Address:  Jerald Kief, MD  Relative Name and Phone Number:  Maureen Ralphs - daughter, [phone] - mobile    Current Level of Care: Hospital Recommended Level of Care: Skilled Nursing Facility Prior Approval Number:    Date Approved/Denied:   PASRR Number: [id number]  Discharge Plan: SNF    Current Diagnoses: Patient Active Problem List   Diagnosis Date Noted  . AKI (acute kidney injury) (HCC) 01/17/2018  . MDD (major depressive disorder) 01/15/2018  . Macrocytic anemia 09/22/2017  . Proteinuria 04/06/2017  . Insomnia 12/01/2016  . CAD (coronary artery disease) 11/17/2016  . Hyperlipidemia 11/17/2016  . Hypertension 11/17/2016  . Aortic atherosclerosis (HCC) 11/03/2016  . CKD (chronic kidney disease) stage 3, GFR 30-59 ml/min (HCC) 04/14/2015  . Colonic constipation 04/14/2015  . Frequent UTI 04/01/2015  . Diabetes mellitus with renal complications (HCC) 03/03/2015  . Atrophic vaginitis 02/24/2015  . Rheumatoid arthritis (HCC) 02/18/2015  . DNR (do not resuscitate) 02/13/2015  . Occlusion and stenosis of carotid artery without mention of cerebral infarction 05/02/2013  . 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 FEMORAL BRUIT 11/24/2009  . Hypothyroidism 06/26/2009  . CEREBROVASCULAR DISEASE 06/25/2009  . ATRIAL FIBRILLATION 05/19/2008  . Chronic diastolic heart failure (HCC) 05/19/2008  . RENAL ARTERY STENOSIS 05/19/2008  . PERIPHERAL VASCULAR DISEASE 05/19/2008    Orientation RESPIRATION BLADDER Height & Weight     Self, Time, Place  Normal Continent,  External catheter(Placed 10/9) Weight:   Height:     BEHAVIORAL SYMPTOMS/MOOD NEUROLOGICAL BOWEL NUTRITION STATUS      Continent Diet(Heart healthy, carb modified)  AMBULATORY STATUS COMMUNICATION OF NEEDS Skin   Limited Assist(Min guard ) Verbally Normal                       Personal Care Assistance Level of Assistance  Bathing, Feeding, Dressing Bathing Assistance: Limited assistance Feeding assistance: Independent Dressing Assistance: Limited assistance     Functional Limitations Info  Sight, Hearing, Speech Sight Info: Impaired Hearing Info: Adequate Speech Info: Adequate    SPECIAL CARE FACTORS FREQUENCY  PT (By licensed PT)     PT Frequency: Evaluated 10/9              Contractures Contractures Info: Not present    Additional Factors Info  Code Status, Allergies, Insulin Sliding Scale Code Status Info: DNR Allergies Info: Defueoxime, Fish allergy, Ciprofloxacin, Codeine   Insulin Sliding Scale Info: 0-9 Units 3X per day with meals       Current Medications (01/18/2018):  This is the current hospital active medication list Current Facility-Administered Medications  Medication Dose Route Frequency Provider Last Rate Last Dose  . acetaminophen (TYLENOL) tablet 650 mg  650 mg Oral Q6H PRN Jonah Blue, MD       Or  . acetaminophen (TYLENOL) suppository 650 mg  650 mg Rectal Q6H PRN Jonah Blue, MD      . apixaban Everlene Balls) tablet 2.5 mg  2.5 mg Oral BID Earnie Larsson, RPH   2.5 mg at 01/18/18 2751  . aztreonam (AZACTAM) 1 g in sodium chloride 0.9 %  100 mL IVPB  1 g Intravenous Bobette Mo, MD 200 mL/hr at 01/18/18 1921 1 g at 01/18/18 1921  . clonazePAM (KLONOPIN) tablet 0.5 mg  0.5 mg Oral QHS Jonah Blue, MD   0.5 mg at 01/17/18 2058  . diltiazem (CARDIZEM CD) 24 hr capsule 180 mg  180 mg Oral QHS Jonah Blue, MD   180 mg at 01/17/18 2057  . docusate sodium (COLACE) capsule 100 mg  100 mg Oral BID Jonah Blue, MD   100 mg at  01/18/18 7711  . ferrous fumarate-b12-vitamic C-folic acid (TRINSICON / FOLTRIN) capsule 1 capsule  1 capsule Oral BID PC Scarlett Presto, RPH   1 capsule at 01/18/18 1729  . folic acid (FOLVITE) tablet 1 mg  1 mg Oral Daily Jonah Blue, MD   1 mg at 01/18/18 0926  . insulin aspart (novoLOG) injection 0-9 Units  0-9 Units Subcutaneous TID WC Jonah Blue, MD   1 Units at 01/18/18 1730  . lactated ringers infusion   Intravenous Continuous Jonah Blue, MD 75 mL/hr at 01/17/18 1823    . levothyroxine (SYNTHROID, LEVOTHROID) tablet 50 mcg  50 mcg Oral QAC breakfast Jonah Blue, MD   50 mcg at 01/18/18 6579  . [START ON 01/19/2018] methotrexate (RHEUMATREX) tablet 20 mg  20 mg Oral Q Catarina Hartshorn, MD      . metoprolol succinate (TOPROL-XL) 24 hr tablet 50 mg  50 mg Oral Daily Jonah Blue, MD   50 mg at 01/18/18 0926  . ondansetron (ZOFRAN) tablet 4 mg  4 mg Oral Q6H PRN Jonah Blue, MD       Or  . ondansetron [facility]) injection 4 mg  4 mg Intravenous Q6H PRN Jonah Blue, MD      . pravastatin (PRAVACHOL) tablet 40 mg  40 mg Oral Daily Jonah Blue, MD   40 mg at 01/18/18 0926  . traMADol (ULTRAM) tablet 50 mg  50 mg Oral BID Jonah Blue, MD   50 mg at 01/18/18 0383  . traZODone (DESYREL) tablet 25-50 mg  25-50 mg Oral QHS PRN Jonah Blue, MD      . vitamin B-12 (CYANOCOBALAMIN) tablet 1,000 mcg  1,000 mcg Oral Daily Jonah Blue, MD   1,000 mcg at 01/18/18 3383     Discharge Medications: Please see discharge summary for a list of discharge medications.  Relevant Imaging Results:  Relevant Lab Results:   Additional Information SS# - [id number]  Cristobal Goldmann, LCSW

## 2018-01-18 NOTE — Progress Notes (Signed)
Physical Therapy Treatment Patient Details Name: [name] MRN: [id number] DOB: 09-28-1929 Today's Date: 01/18/2018    History of Present Illness Pt is a 84 y.o. F with significant PMH of hypothyroidism, PVD, mesenteric ischemia, HTN, HLD, DM, CHF, CAD, and afib who presents after a fall. CT head negative acute abnormality. Imaging negative for acute fracture.     PT Comments    Pt asleep on entry but willing to work with therapy when awakened. Pt found to be wet in the bed and agreeable to standing at [facility] for pericare. Pt requires modA for bed mobility out of bed and for sit>stand to RW. Once in upright pt became incontinent of large amount of urine. Pt requires maxA to clean up afterwards and maxA to return to bed due to increased fatigue from standing. PT continues to recommend SNF level rehab.   Follow Up Recommendations  Supervision/Assistance - 24 hour;SNF     Equipment Recommendations  None recommended by PT       Precautions / Restrictions Precautions Precautions: Fall Restrictions Weight Bearing Restrictions: No    Mobility  Bed Mobility Overal bed mobility: Needs Assistance Bed Mobility: Supine to Sit;Sit to Supine     Supine to sit: Mod assist Sit to supine: Max assist   General bed mobility comments: Mod assist to elevate trunk to sitting, maxA for management of LE into bed and torso to bed surface  Transfers Overall transfer level: Needs assistance Equipment used: Rolling walker (2 wheeled) Transfers: Sit to/from Stand Sit to Stand: Mod assist         General transfer comment: mod A for standing to initate pericare, once in fully upright pt incontinent of large amount of urine, needs mod A for steadying during pericare  Ambulation/Gait             General Gait Details: unable to attempt ambulation secondary to fatigue with cleaning up after incontinence         Balance Overall balance assessment: Needs assistance Sitting-balance  support: No upper extremity supported;Feet supported Sitting balance-Leahy Scale: Fair       Standing balance-Leahy Scale: Poor                              Cognition Arousal/Alertness: Awake/alert Behavior During Therapy: WFL for tasks assessed/performed Overall Cognitive Status: Impaired/Different from baseline Area of Impairment: Following commands;Awareness;Problem solving;Orientation;Attention                 Orientation Level: Disoriented to;Place;Time Current Attention Level: Sustained   Following Commands: Follows one step commands inconsistently;Follows one step commands with increased time   Awareness: Emergent Problem Solving: Slow processing;Decreased initiation;Difficulty sequencing;Requires verbal cues;Requires tactile cues General Comments: Slow processing         General Comments General comments (skin integrity, edema, etc.): VSS, pt daughter [name] contacted and informed of pt's increased need for assist today and continued recommendation for SNF      Pertinent Vitals/Pain Pain Assessment: No/denies pain           PT Goals (current goals can now be found in the care plan section) Acute Rehab PT Goals Patient Stated Goal: none stated by patient; pt daughter would like pt to go to ILF with HHPT PT Goal Formulation: With patient/family Time For Goal Achievement: 01/31/18 Potential to Achieve Goals: Good Progress towards PT goals: Not progressing toward goals - comment(pt very fatigued today and unable to attempt ambulation )  Frequency    Min 2X/week      PT Plan Current plan remains appropriate;Frequency needs to be updated       AM-PAC PT "6 Clicks" Daily Activity  Outcome Measure  Difficulty turning over in bed (including adjusting bedclothes, sheets and blankets)?: Unable Difficulty moving from lying on back to sitting on the side of the bed? : Unable Difficulty sitting down on and standing up from a chair with arms  (e.g., wheelchair, bedside commode, etc,.)?: Unable Help needed moving to and from a bed to chair (including a wheelchair)?: Total Help needed walking in hospital room?: Total Help needed climbing 3-5 steps with a railing? : Total 6 Click Score: 6    End of Session Equipment Utilized During Treatment: Gait belt Activity Tolerance: Patient tolerated treatment well Patient left: in bed;with call bell/phone within reach;with family/visitor present Nurse Communication: Mobility status PT Visit Diagnosis: Unsteadiness on feet (R26.81);Other abnormalities of gait and mobility (R26.89);Muscle weakness (generalized) (M62.81);History of falling (Z91.81);Difficulty in walking, not elsewhere classified (R26.2)     Time: 0626-9485 PT Time Calculation (min) (ACUTE ONLY): 31 min  Charges:  $Therapeutic Activity: 23-37 mins                     Miriam Liles B. Beverely Risen PT, DPT Acute Rehabilitation Services Pager [phone] Office [phone]    Elon Alas Fleet 01/18/2018, 11:49 AM

## 2018-01-18 NOTE — Plan of Care (Signed)
  Problem: Activity: Goal: Risk for activity intolerance will decrease Outcome: Progressing   

## 2018-01-18 NOTE — Plan of Care (Signed)
  Problem: Pain Managment: Goal: General experience of comfort will improve Outcome: Progressing   

## 2018-01-18 NOTE — Progress Notes (Signed)
PROGRESS NOTE    [name]  SLH:[id number] DOB: 11-19-1929 DOA: 01/17/2018 PCP: Rodolph Bong, MD    Brief Narrative:  84 y.o. female with medical history significant of hypothyroidism; PVD; mesenteric ischemia; HTN; HLD; DM; CHF; CAD; and afib presenting with fall. She fell last night, has been disoriented and confused.  She saw PCP Monday and was started on an antibiotic but her daughter is concerned that she was already advanced before it was started.  Sunday, she had urinary frequency and was in bed all day.  She also had a bit of dysuria.  She was weak and a little confused but a little better yesterday, with mild dizziness.  Uncertain about why she fell.  She was last seen at 9PM and was fine last night.  She was found this AM about 9 and she was in the floor.  She states that she feels fine now, not dizzy or lightheaded.  Her daughter reports mild confusion but overall doing some better.  ED Course:  Fall, laid in the floor all night.  She was started on Bactrim for UTI Monday.  Generally weak, trying to hydrate.  Knee pain, xrays negative.  Head CT ok.  Mild AKI, creatinine 1.9.  IV Aztreonam for +UCx from Monday positive for gram negative bacillus - but sensitive to Bactrim.  Given IVF.  Likely ok for Obs, Med Surg.  Assessment & Plan:   Principal Problem:   AKI (acute kidney injury) (HCC) Active Problems:   Hypothyroidism   ATRIAL FIBRILLATION   Chronic diastolic heart failure (HCC)   Rheumatoid arthritis (HCC)   Diabetes mellitus with renal complications (HCC)   Frequent UTI   CKD (chronic kidney disease) stage 3, GFR 30-59 ml/min (HCC)   Hyperlipidemia   Hypertension  AKI -Pt presented with findings worrisome for AKI on stage 3 CKD -Improvement in Cr with hydration overnight -Seen by PT with recommendations for SNF. SW consulted. Placement pending  Frequent UTI -Patient was seen Monday and had urine culture that is + for Klebsiella -She was started on Bactrim at  that time -She has cephalosporin and Cipro allergies/sensitivities -Pt is continued on and is tolerating Aztreonam  Afib -Rate controlled on Cardizem, Toprol XL -Continue Eliquis for [facility] -Stable at present  Chronic heart failure -Not clearly documented - the only echo in Epic including Care Everywhere was on 11/18/16 and showed a preserved EF and was unable to evaluate diastolic function due to afib -diuretics remain on hold given concerns of AKI -Repeat renal panel in AM  DM -Recent A1c was 6.6 -Continue with SSI as needed  HTN -Continue Cardizem, Toprol XL  HLD -Continue Pravachol as tolerated  Hypothyroidism -TSH normal, reviewed -Continue with synthroid as tolerated  RA -Continue Methotrexate -Stable at present  DVT prophylaxis: eliquis Code Status: DNR Family Communication: Pt in room, family not at bedside Disposition Plan: SNF, timing uncertain  Consultants:     Procedures:     Antimicrobials: Anti-infectives (From admission, onward)   Start     Dose/Rate Route Frequency Ordered Stop   01/17/18 1130  aztreonam (AZACTAM) 1 g in sodium chloride 0.9 % 100 mL IVPB     1 g 200 mL/hr over 30 Minutes Intravenous Every 8 hours 01/17/18 1120         Subjective: Feels somewhat better today  Objective: Vitals:   01/17/18 1818 01/17/18 2045 01/18/18 0508 01/18/18 0756  BP: (!) 147/76 (!) 120/51 (!) 118/58 133/61  Pulse: 84 74 68 81  Resp: 18 16 16 18   Temp: 98.2 F (36.8 C) 98.3 F (36.8 C) 98.4 F (36.9 C) 98.2 F (36.8 C)  TempSrc: Oral Oral  Oral  SpO2: 95% 93% 98% 96%    Intake/Output Summary (Last 24 hours) at 01/18/2018 1258 Last data filed at 01/18/2018 1100 Gross per 24 hour  Intake 1300 ml  Output 975 ml  Net 325 ml   There were no vitals filed for this visit.  Examination:  General exam: Appears calm and comfortable  Respiratory system: Clear to auscultation. Respiratory effort normal. Cardiovascular system: S1 & S2  heard, RRR Gastrointestinal system: Abdomen is nondistended, soft and nontender. No organomegaly or masses felt. Normal bowel sounds heard. Central nervous system: Alert and oriented. No focal neurological deficits. Extremities: Symmetric 5 x 5 power. Skin: No rashes, lesions  Psychiatry: Judgement and insight appear normal. Mood & affect appropriate.   Data Reviewed: I have personally reviewed following labs and imaging studies  CBC: Recent Labs  Lab 01/17/18 1100 01/18/18 0535  WBC 4.0 3.9*  NEUTROABS 2.7  --   HGB 10.9* 10.3*  HCT 33.9* 31.6*  MCV 97.4 98.8  PLT 357 335   Basic Metabolic Panel: Recent Labs  Lab 01/17/18 1100 01/18/18 0535  NA 132* 134*  K 3.8 3.8  CL 92* 99  CO2 26 26  GLUCOSE 172* 123*  BUN 30* 25*  CREATININE 1.95* 1.58*  CALCIUM 9.8 8.9   GFR: Estimated Creatinine Clearance: 23.3 mL/min (A) (by C-G formula based on SCr of 1.58 mg/dL (H)). Liver Function Tests: No results for input(s): AST, ALT, ALKPHOS, BILITOT, PROT, ALBUMIN in the last 168 hours. No results for input(s): LIPASE, AMYLASE in the last 168 hours. No results for input(s): AMMONIA in the last 168 hours. Coagulation Profile: Recent Labs  Lab 01/17/18 1100  INR 2.28   Cardiac Enzymes: Recent Labs  Lab 01/17/18 1618  CKTOTAL 569*   BNP (last 3 results) No results for input(s): PROBNP in the last 8760 hours. HbA1C: No results for input(s): HGBA1C in the last 72 hours. CBG: Recent Labs  Lab 01/17/18 1037 01/17/18 1751 01/18/18 0755 01/18/18 1128  GLUCAP 173* 116* 111* 165*   Lipid Profile: No results for input(s): CHOL, HDL, LDLCALC, TRIG, CHOLHDL, LDLDIRECT in the last 72 hours. Thyroid Function Tests: Recent Labs    01/17/18 1618  TSH 3.237   Anemia Panel: No results for input(s): VITAMINB12, FOLATE, FERRITIN, TIBC, IRON, RETICCTPCT in the last 72 hours. Sepsis Labs: No results for input(s): PROCALCITON, LATICACIDVEN in the last 168 hours.  Recent Results  (from the past 240 hour(s))  Urine Culture     Status: Abnormal   Collection Time: 01/15/18  2:43 PM  Result Value Ref Range Status   MICRO NUMBER: [id number]  Final   SPECIMEN QUALITY: ADEQUATE  Final   Sample Source NOT GIVEN  Final   STATUS: FINAL  Final   ISOLATE 1: Klebsiella oxytoca (A)  Final    Comment: Greater than 100,000 CFU/mL of Klebsiella oxytoca      Susceptibility   Klebsiella oxytoca - URINE CULTURE, REFLEX    AMOX/CLAVULANIC <=2 Sensitive     AMPICILLIN >=32 Resistant     AMPICILLIN/SULBACTAM 8 Sensitive     CEFAZOLIN* >=64 Resistant      * For uncomplicated UTI caused by E. coli,K. pneumoniae or P. mirabilis: Cefazolin issusceptible if MIC <32 mcg/mL and predictssusceptible to the oral agents cefaclor, cefdinir,cefpodoxime, cefprozil, cefuroxime, cephalexinand loracarbef.    CEFEPIME <=1 Sensitive  CEFTRIAXONE <=1 Sensitive     CIPROFLOXACIN <=0.25 Sensitive     LEVOFLOXACIN <=0.12 Sensitive     ERTAPENEM <=0.5 Sensitive     GENTAMICIN <=1 Sensitive     IMIPENEM <=0.25 Sensitive     NITROFURANTOIN <=16 Sensitive     PIP/TAZO 8 Sensitive     TOBRAMYCIN <=1 Sensitive     TRIMETH/SULFA* <=20 Sensitive      * For uncomplicated UTI caused by E. coli,K. pneumoniae or P. mirabilis: Cefazolin issusceptible if MIC <32 mcg/mL and predictssusceptible to the oral agents cefaclor, cefdinir,cefpodoxime, cefprozil, cefuroxime, cephalexinand loracarbef.Legend:S = Susceptible  I = IntermediateR = Resistant  NS = Not susceptible* = Not tested  NR = Not reported**NN = See antimicrobic comments  MRSA PCR Screening     Status: None   Collection Time: 01/17/18  6:44 PM  Result Value Ref Range Status   MRSA by PCR NEGATIVE NEGATIVE Final    Comment:        The GeneXpert MRSA Assay (FDA approved for NASAL specimens only), is one component of a comprehensive MRSA colonization surveillance program. It is not intended to diagnose MRSA infection nor to guide or monitor treatment  for MRSA infections. Performed at [facility], [address], [place], [place] [address]      Radiology Studies: Dg Chest 1 View  Result Date: 01/17/2018 CLINICAL DATA:  Fall.  Pain EXAM: CHEST  1 VIEW COMPARISON:  02/14/2017 FINDINGS: Postop CABG. Mild cardiac enlargement without heart failure. Hypoventilation. Decreased lung volume with mild atelectasis in the bases. Negative for pneumonia or effusion IMPRESSION: Mild bibasilar atelectasis.  Negative for pneumonia Electronically Signed   By: Marlan Palau M.D.   On: 01/17/2018 11:57   Ct Head Wo Contrast  Result Date: 01/17/2018 CLINICAL DATA:  Altered level of consciousness. Found on floor. Recent fall EXAM: CT HEAD WITHOUT CONTRAST TECHNIQUE: Contiguous axial images were obtained from the base of the skull through the vertex without intravenous contrast. COMPARISON:  None. FINDINGS: Brain: No evidence of acute infarction, hemorrhage, hydrocephalus, extra-axial collection or mass lesion/mass effect. Chronic small vessel ischemia with extensive gliosis in the cerebral white matter and heterogeneous deep gray nuclei. Prominent sulcus in the parasagittal left occipital lobe, question remote infarct in this location. Vascular: Atherosclerotic calcification Skull: Negative for fracture. Partially covered advanced degenerative disease at the craniocervical junction. Sinuses/Orbits: No evidence of injury.  Left cataract resection IMPRESSION: No acute finding. Advanced chronic small vessel ischemia. Electronically Signed   By: Marnee Spring M.D.   On: 01/17/2018 12:23   Dg Knee Complete 4 Views Right  Result Date: 01/17/2018 CLINICAL DATA:  Fall.  Pain EXAM: RIGHT KNEE - COMPLETE 4+ VIEW COMPARISON:  None. FINDINGS: Total knee replacement in satisfactory position. No fracture identified. Mild arterial calcification. IMPRESSION: Negative for fracture Electronically Signed   By: Marlan Palau M.D.   On: 01/17/2018 11:58   Dg Hip Unilat With  Pelvis 2-3 Views Right  Result Date: 01/17/2018 CLINICAL DATA:  Fall, right hip pain EXAM: DG HIP (WITH OR WITHOUT PELVIS) 2-3V RIGHT COMPARISON:  None. FINDINGS: Right hip joint shows mild joint space narrowing. Negative for fracture or AVN. No pelvic fracture Chronic fracture left femur with surgical fixation External urinary catheter. IMPRESSION: Negative for acute fracture. Electronically Signed   By: Marlan Palau M.D.   On: 01/17/2018 11:55    Scheduled Meds: . apixaban  2.5 mg Oral BID  . clonazePAM  0.5 mg Oral QHS  . diltiazem  180 mg Oral QHS  . docusate sodium  100 mg Oral BID  . ferrous fumarate-b12-vitamic C-folic acid  1 capsule Oral BID PC  . folic acid  1 mg Oral Daily  . insulin aspart  0-9 Units Subcutaneous TID WC  . levothyroxine  50 mcg Oral QAC breakfast  . [START ON 01/19/2018] methotrexate  20 mg Oral Q Fri  . metoprolol succinate  50 mg Oral Daily  . pravastatin  40 mg Oral Daily  . traMADol  50 mg Oral BID  . vitamin B-12  1,000 mcg Oral Daily   Continuous Infusions: . aztreonam 1 g (01/18/18 1027)  . lactated ringers 75 mL/hr at 01/17/18 1823     LOS: 0 days   Rickey Eryanna, MD [facility] Pager On Amion  If 7PM-7AM, please contact night-coverage 01/18/2018, 12:58 PM

## 2018-01-19 DIAGNOSIS — R531 Weakness: Secondary | ICD-10-CM | POA: Diagnosis not present

## 2018-01-19 DIAGNOSIS — M868X7 Other osteomyelitis, ankle and foot: Secondary | ICD-10-CM | POA: Diagnosis not present

## 2018-01-19 DIAGNOSIS — I129 Hypertensive chronic kidney disease with stage 1 through stage 4 chronic kidney disease, or unspecified chronic kidney disease: Secondary | ICD-10-CM | POA: Diagnosis not present

## 2018-01-19 DIAGNOSIS — Z743 Need for continuous supervision: Secondary | ICD-10-CM | POA: Diagnosis not present

## 2018-01-19 DIAGNOSIS — W19XXXA Unspecified fall, initial encounter: Secondary | ICD-10-CM | POA: Diagnosis not present

## 2018-01-19 DIAGNOSIS — N390 Urinary tract infection, site not specified: Secondary | ICD-10-CM | POA: Diagnosis not present

## 2018-01-19 DIAGNOSIS — N183 Chronic kidney disease, stage 3 (moderate): Secondary | ICD-10-CM | POA: Diagnosis not present

## 2018-01-19 DIAGNOSIS — M069 Rheumatoid arthritis, unspecified: Secondary | ICD-10-CM | POA: Diagnosis not present

## 2018-01-19 DIAGNOSIS — I5032 Chronic diastolic (congestive) heart failure: Secondary | ICD-10-CM | POA: Diagnosis not present

## 2018-01-19 DIAGNOSIS — I130 Hypertensive heart and chronic kidney disease with heart failure and stage 1 through stage 4 chronic kidney disease, or unspecified chronic kidney disease: Secondary | ICD-10-CM | POA: Diagnosis not present

## 2018-01-19 DIAGNOSIS — L03115 Cellulitis of right lower limb: Secondary | ICD-10-CM | POA: Diagnosis not present

## 2018-01-19 DIAGNOSIS — I10 Essential (primary) hypertension: Secondary | ICD-10-CM | POA: Diagnosis not present

## 2018-01-19 DIAGNOSIS — E11628 Type 2 diabetes mellitus with other skin complications: Secondary | ICD-10-CM | POA: Diagnosis not present

## 2018-01-19 DIAGNOSIS — Z951 Presence of aortocoronary bypass graft: Secondary | ICD-10-CM | POA: Diagnosis not present

## 2018-01-19 DIAGNOSIS — R279 Unspecified lack of coordination: Secondary | ICD-10-CM | POA: Diagnosis not present

## 2018-01-19 DIAGNOSIS — Z7901 Long term (current) use of anticoagulants: Secondary | ICD-10-CM | POA: Diagnosis not present

## 2018-01-19 DIAGNOSIS — E1122 Type 2 diabetes mellitus with diabetic chronic kidney disease: Secondary | ICD-10-CM | POA: Diagnosis not present

## 2018-01-19 DIAGNOSIS — Z66 Do not resuscitate: Secondary | ICD-10-CM | POA: Diagnosis not present

## 2018-01-19 DIAGNOSIS — R2689 Other abnormalities of gait and mobility: Secondary | ICD-10-CM | POA: Diagnosis not present

## 2018-01-19 DIAGNOSIS — N184 Chronic kidney disease, stage 4 (severe): Secondary | ICD-10-CM | POA: Diagnosis not present

## 2018-01-19 DIAGNOSIS — E1169 Type 2 diabetes mellitus with other specified complication: Secondary | ICD-10-CM | POA: Diagnosis not present

## 2018-01-19 DIAGNOSIS — I482 Chronic atrial fibrillation, unspecified: Secondary | ICD-10-CM | POA: Diagnosis not present

## 2018-01-19 DIAGNOSIS — N179 Acute kidney failure, unspecified: Secondary | ICD-10-CM | POA: Diagnosis not present

## 2018-01-19 DIAGNOSIS — K219 Gastro-esophageal reflux disease without esophagitis: Secondary | ICD-10-CM | POA: Diagnosis not present

## 2018-01-19 DIAGNOSIS — M79671 Pain in right foot: Secondary | ICD-10-CM | POA: Diagnosis not present

## 2018-01-19 DIAGNOSIS — I4891 Unspecified atrial fibrillation: Secondary | ICD-10-CM | POA: Diagnosis not present

## 2018-01-19 DIAGNOSIS — N178 Other acute kidney failure: Secondary | ICD-10-CM | POA: Diagnosis not present

## 2018-01-19 DIAGNOSIS — L02611 Cutaneous abscess of right foot: Secondary | ICD-10-CM | POA: Diagnosis not present

## 2018-01-19 DIAGNOSIS — D631 Anemia in chronic kidney disease: Secondary | ICD-10-CM | POA: Diagnosis not present

## 2018-01-19 DIAGNOSIS — L089 Local infection of the skin and subcutaneous tissue, unspecified: Secondary | ICD-10-CM | POA: Diagnosis not present

## 2018-01-19 DIAGNOSIS — I480 Paroxysmal atrial fibrillation: Secondary | ICD-10-CM | POA: Diagnosis not present

## 2018-01-19 DIAGNOSIS — B961 Klebsiella pneumoniae [K. pneumoniae] as the cause of diseases classified elsewhere: Secondary | ICD-10-CM | POA: Diagnosis not present

## 2018-01-19 DIAGNOSIS — R278 Other lack of coordination: Secondary | ICD-10-CM | POA: Diagnosis not present

## 2018-01-19 LAB — BASIC METABOLIC PANEL
Anion gap: 9 (ref 5–15)
BUN: 23 mg/dL (ref 8–23)
CHLORIDE: 104 mmol/L (ref 98–111)
CO2: 25 mmol/L (ref 22–32)
CREATININE: 1.25 mg/dL — AB (ref 0.44–1.00)
Calcium: 8.7 mg/dL — ABNORMAL LOW (ref 8.9–10.3)
GFR calc non Af Amer: 38 mL/min — ABNORMAL LOW (ref >60)
GFR, EST AFRICAN AMERICAN: 44 mL/min — AB (ref >60)
Glucose, Bld: 110 mg/dL — ABNORMAL HIGH (ref 70–99)
Potassium: 3.8 mmol/L (ref 3.5–5.1)
SODIUM: 138 mmol/L (ref 135–145)

## 2018-01-19 LAB — GLUCOSE, CAPILLARY
GLUCOSE-CAPILLARY: 108 mg/dL — AB (ref 70–99)
GLUCOSE-CAPILLARY: 183 mg/dL — AB (ref 70–99)

## 2018-01-19 MED ORDER — SULFAMETHOXAZOLE-TRIMETHOPRIM 400-80 MG PO TABS: 1.0000 | ORAL_TABLET | Freq: Two times a day (BID) | ORAL | 0 refills | Status: AC

## 2018-01-19 MED ORDER — CLONAZEPAM 0.5 MG PO TABS: 0.5000 mg | ORAL_TABLET | Freq: Every day | ORAL | 5 refills | Status: DC

## 2018-01-19 MED ORDER — SULFAMETHOXAZOLE-TRIMETHOPRIM 400-80 MG PO TABS
1.0000 | ORAL_TABLET | Freq: Two times a day (BID) | ORAL | Status: DC
  Administered 2018-01-19: 1 via ORAL
  Filled 2018-01-19: qty 1

## 2018-01-19 MED ORDER — APIXABAN 5 MG PO TABS
5.0000 mg | ORAL_TABLET | Freq: Two times a day (BID) | ORAL | Status: DC
  Administered 2018-01-19: 5 mg via ORAL
  Filled 2018-01-19: qty 1

## 2018-01-19 MED ORDER — METHOTREXATE 2.5 MG PO TABS: 10.0000 mg | ORAL_TABLET | ORAL | Status: DC

## 2018-01-19 MED ORDER — TRAMADOL HCL 50 MG PO TABS: 50.0000 mg | ORAL_TABLET | Freq: Two times a day (BID) | ORAL | Status: DC

## 2018-01-19 NOTE — Clinical Social Work Note (Signed)
Clinical Social Work Assessment  Patient Details  Name: [name] MRN: [id number] Date of Birth: 1930/02/12  Date of referral:  01/17/18               Reason for consult:  Facility Placement, Discharge Planning                Permission sought to share information with:  Family Supports Permission granted to share information::  Yes, Verbal Permission Granted  Name::     [name]  Agency::     Relationship::  Daughter  Contact Information:  [phone] - mobile  Housing/Transportation Living arrangements for the past 2 months:  Independent Living Facility([facility]) Source of Information:  Patient, Adult Children(Daughter [name]) Patient Interpreter Needed:  None Criminal Activity/Legal Involvement Pertinent to Current Situation/Hospitalization:  No - Comment as needed Significant Relationships:  Adult Children Lives with:  Other (Comment)(Independent Living - [facility]) Do you feel safe going back to the place where you live?  No(Patient and daughter in agreement with ST rehab before returning home) Need for family participation in patient care:  Yes (Comment)  Care giving concerns: Patient and daughter in agreement with ST rehab as patient has an apartment in [place]  Social Worker assessment / plan:  On 10/10, CSW talked with patient at the bedside regarding discharge disposition and recommendation of ST rehab. [name] was napping, but awakened easily when her name was called. Patient reported that she has lived at [facility] for 2 years and had been to a facility for ST rehab before, but could not remember the name. When asked about family, [name] reported that her daughter [name] is the youngest of 7 children and she has 12 grandchildren and 9 great-grandchildren. CSW was given permission to contact her daughter [name] regarding her discharge.   CSW talked with daughter by phone later and she was in full agreement with ST rehab and  requested [facility], as her mom has been there before and they liked it. Daughter was provided with facility search process and advised that [facility] admissions director would be contacted regarding her mom.  Employment status:  Retired Health and safety inspector:  Managed Medicare([employer] PPO) PT Recommendations:  Skilled Nursing Facility Information / Referral to community resources:  Other (Comment Required)(None needed or requested as daughter provided CSW with facility preference)  Patient/Family's Response to care:  Patient and daughter did not express any concerns regarding her care during hospitalization.  Patient/Family's Understanding of and Emotional Response to Diagnosis, Current Treatment, and Prognosis:  Patient and daughter expressed understanding of the need for rehab and are in agreement.  Emotional Assessment Appearance:  Appears stated age Attitude/Demeanor/Rapport:  Engaged Affect (typically observed):  Pleasant, Appropriate Orientation:  Oriented to Self, Oriented to Place, Oriented to  Time, Oriented to Situation Alcohol / Substance use:  Tobacco Use, Alcohol Use, Illicit Drugs(Patient reported per H&P that she has never smoked and does not drink or use illicit drugs) Psych involvement (Current and /or in the community):  No (Comment)  Discharge Needs  Concerns to be addressed:  Discharge Planning Concerns Readmission within the last 30 days:  No Current discharge risk:  None Barriers to Discharge:  Insurance Authorization   Cristobal Goldmann, LCSW 01/19/2018, 8:41 AM

## 2018-01-19 NOTE — Clinical Social Work Placement (Signed)
   CLINICAL SOCIAL WORK PLACEMENT  NOTE 01/19/18 - DISCHARGED TO [facility] VIA AMBULANCE  Date:  01/19/2018  Patient Details  Name: [name] MRN: [id number] Date of Birth: 1929/07/30  Clinical Social Work is seeking post-discharge placement for this patient at the Skilled  Nursing Facility level of care (*CSW will initial, date and re-position this form in  chart as items are completed):  No   Patient/family provided with [facility] Clinical Social Work Department's list of facilities offering this level of care within the geographic area requested by the patient (or if unable, by the patient's family).  Yes   Patient/family informed of their freedom to choose among providers that offer the needed level of care, that participate in Medicare, Medicaid or managed care program needed by the patient, have an available bed and are willing to accept the patient.  No   Patient/family informed of [facility]'s ownership interest in [facility] and [facility], as well as of the fact that they are under no obligation to receive care at these facilities.  PASRR submitted to EDS on       PASRR number received on       Existing PASRR number confirmed on 01/18/18     FL2 transmitted to all facilities in geographic area requested by pt/family on 01/18/18     FL2 transmitted to all facilities within larger geographic area on       Patient informed that his/her managed care company has contracts with or will negotiate with certain facilities, including the following:        Yes   Patient/family informed of bed offers received.  Patient chooses bed at [facility]     Physician recommends and patient chooses bed at      Patient to be transferred to [facility] on 01/19/18.  Patient to be transferred to facility by Ambulance     Patient family notified on 01/19/18 of transfer.  Name of family member notified:  Maureen Ralphs -  daughter by phone     PHYSICIAN       Additional Comment:    _______________________________________________ Cristobal Goldmann, LCSW 01/19/2018, 1:25 PM

## 2018-01-19 NOTE — Progress Notes (Signed)
Physical Therapy Treatment Patient Details Name: [name] MRN: [id number] DOB: 1929-10-01 Today's Date: 01/19/2018    History of Present Illness Pt is a 84 y.o. F with significant PMH of hypothyroidism, PVD, mesenteric ischemia, HTN, HLD, DM, CHF, CAD, and afib who presents after a fall. CT head negative acute abnormality. Imaging negative for acute fracture.     PT Comments    Patient progressing well towards PT goals. Tolerated gait training with Min guard assist for balance/safety. Pt requires Mod A to stand from chair with cues for technique. Pt with slow processing and requires repetition of cues to perform tasks. Continues to demonstrate weakness throughout BLEs requiring frequent seated rest breaks. Plans to d/c to SNF today. Will follow.     Follow Up Recommendations  Supervision/Assistance - 24 hour;SNF     Equipment Recommendations  None recommended by PT    Recommendations for Other Services       Precautions / Restrictions Precautions Precautions: Fall Restrictions Weight Bearing Restrictions: No    Mobility  Bed Mobility Overal bed mobility: Needs Assistance Bed Mobility: Supine to Sit     Supine to sit: Mod assist     General bed mobility comments: Up in chair upon PT arrival.   Transfers Overall transfer level: Needs assistance Equipment used: Rolling walker (2 wheeled) Transfers: Sit to/from Stand Sit to Stand: Mod assist;Min assist         General transfer comment: mod A initially progressing to Min to power up and steady. Stood from chair x2  Ambulation/Gait Ambulation/Gait assistance: Land (Feet): 20 Feet(+ 40' + 50') Assistive device: Rolling walker (2 wheeled) Gait Pattern/deviations: Step-through pattern;Decreased stride length;Trunk flexed;Decreased dorsiflexion - right;Decreased dorsiflexion - left Gait velocity: decreased   General Gait Details: Slow, unsteady gait with 2 seated rest breaks and leaning on  right forearm due to pain in wrist. Cues for RW management.    Stairs             Wheelchair Mobility    Modified Rankin (Stroke Patients Only)       Balance Overall balance assessment: Needs assistance Sitting-balance support: No upper extremity supported;Feet supported Sitting balance-Leahy Scale: Fair     Standing balance support: During functional activity Standing balance-Leahy Scale: Poor Standing balance comment: Requires BUE support.                            Cognition Arousal/Alertness: Awake/alert Behavior During Therapy: WFL for tasks assessed/performed Overall Cognitive Status: Impaired/Different from baseline Area of Impairment: Following commands;Awareness;Problem solving;Attention                   Current Attention Level: Sustained   Following Commands: Follows one step commands with increased time   Awareness: Emergent Problem Solving: Slow processing;Decreased initiation;Difficulty sequencing;Requires verbal cues;Requires tactile cues General Comments: Slow processing      Exercises      General Comments General comments (skin integrity, edema, etc.): Son showed up towards of session.      Pertinent Vitals/Pain Pain Assessment: Faces Faces Pain Scale: Hurts little more Pain Location: right wrist Pain Descriptors / Indicators: Sore;Aching;Guarding Pain Intervention(s): Monitored during session;Repositioned    Home Living Family/patient expects to be discharged to:: Other (Comment) Living Arrangements: Alone Available Help at Discharge: Available PRN/intermittently;Family Type of Home: Independent living facility Home Access: Level entry   Home Layout: One level Home Equipment: Walker - 4 wheels;Walker - 2 wheels  Prior Function Level of Independence: Independent with assistive device(s)      Comments: uses Rollator for mobility   PT Goals (current goals can now be found in the care plan section) Acute  Rehab PT Goals Patient Stated Goal: ST rehab at SNF then back to ILF Progress towards PT goals: Progressing toward goals    Frequency    Min 2X/week      PT Plan Current plan remains appropriate    Co-evaluation              AM-PAC PT "6 Clicks" Daily Activity  Outcome Measure  Difficulty turning over in bed (including adjusting bedclothes, sheets and blankets)?: Unable Difficulty moving from lying on back to sitting on the side of the bed? : Unable Difficulty sitting down on and standing up from a chair with arms (e.g., wheelchair, bedside commode, etc,.)?: Unable Help needed moving to and from a bed to chair (including a wheelchair)?: A Little Help needed walking in hospital room?: A Little Help needed climbing 3-5 steps with a railing? : A Lot 6 Click Score: 11    End of Session Equipment Utilized During Treatment: Gait belt Activity Tolerance: Patient tolerated treatment well Patient left: in chair;with call bell/phone within reach;with family/visitor present Nurse Communication: Mobility status PT Visit Diagnosis: Unsteadiness on feet (R26.81);Other abnormalities of gait and mobility (R26.89);Muscle weakness (generalized) (M62.81);History of falling (Z91.81);Difficulty in walking, not elsewhere classified (R26.2);Pain Pain - Right/Left: Right Pain - part of body: Hand     Time: 1349-1410 PT Time Calculation (min) (ACUTE ONLY): 21 min  Charges:  $Gait Training: 8-22 mins                     Mylo Red, PT, DPT Acute Rehabilitation Services Pager [phone] Office [phone]       Blake Divine A Lanier Ensign 01/19/2018, 2:29 PM

## 2018-01-19 NOTE — Discharge Summary (Signed)
Physician Discharge Summary  [name] WUJ:[id number] DOB: June 06, 1929 DOA: 01/17/2018  PCP: Rodolph Bong, MD  Admit date: 01/17/2018 Discharge date: 01/19/2018  Admitted From: Home Disposition:  SNF  Recommendations for Outpatient Follow-up:  1. Follow up with PCP in 1-2 weeks 2. Recommend repeat BMET in 1 week 3. Would resume torsemide on 10/12  Discharge Condition:Improved CODE STATUS:DNR Diet recommendation: Diabetic   Brief/Interim Summary: 84 y.o.femalewith medical history significant ofhypothyroidism; PVD; mesenteric ischemia; HTN; HLD; DM; CHF; CAD; and afib presenting with fall.She fell last night, has been disoriented and confused. She saw PCP Monday and was started on an antibiotic but her daughter is concerned that she was already advanced before it was started. Sunday, she had urinary frequency and was in bed all day. She also had a bit of dysuria. She was weak and a little confused but a little better yesterday, with mild dizziness. Uncertain about why she fell. She was last seen at 9PM and was fine last night. She was found this AM about 9 and she was in the floor. She states that she feels fine now, not dizzy or lightheaded. Her daughter reports mild confusion but overall doing some better.  ED Course:Fall, laid in the floor all night. She was started on Bactrim for UTI Monday. Generally weak, trying to hydrate. Knee pain, xrays negative. Head CT ok. Mild AKI, creatinine 1.9. IV Aztreonam for +UCx from Monday positive for gram negative bacillus - but sensitive to Bactrim. Given IVF. Likely ok for Obs, Med Surg  AKI -Pt presented with findings worrisome for AKI on stage 3 CKD -Improvement in Cr with hydration. Cr down to 1.2, near baseline -Would resume home diuretic on 10/12, repeat bmet in 1 week to be followed up by PCP -Seen by PT with recommendations for SNF. SW consulted. Placement pending  Frequent UTI -Patient was seen Monday and had  urine culture that is + for Klebsiella -She was started on Bactrim at that time -She has cephalosporin and Cipro allergies/sensitivities -Pt was continued on and is tolerating Aztreonam, plan to discharge on bactrim to complete course  Afib -Rate controlled on Cardizem, Toprol XL -Continue Eliquis for [facility] -Stable at present  Chronic diastolic heart failure -Not clearly documented - the only echo in Epic including Care Everywhere was on 11/18/16 and showed a preserved EF and was unable to evaluate diastolic function due to afib -diuretics were initially held given concerns of presenting AKI, would resume on 10/12 as renal function has improved  DM -Recent A1c was 6.6  HTN -Continue Cardizem, Toprol XL  HLD -Continue Pravachol as tolerated  Hypothyroidism -TSH normal, reviewed -Continue with synthroid as tolerated  RA -Continue Methotrexate -Stable at present  Discharge Diagnoses:  Principal Problem:   AKI (acute kidney injury) (HCC) Active Problems:   Hypothyroidism   ATRIAL FIBRILLATION   Chronic diastolic heart failure (HCC)   Rheumatoid arthritis (HCC)   Diabetes mellitus with renal complications (HCC)   Frequent UTI   CKD (chronic kidney disease) stage 3, GFR 30-59 ml/min (HCC)   Hyperlipidemia   Hypertension    Discharge Instructions   Allergies as of 01/19/2018      Reactions   Cefuroxime Anaphylaxis, Other (See Comments)   Throat swelling   Fish Allergy Shortness Of Breath   Pt said she has throat swelling    Ciprofloxacin Rash   Codeine Other (See Comments)   Jittery Jittery   Sertraline Hcl Rash   Cephalexin Rash  Medication List    STOP taking these medications   sulfamethoxazole-trimethoprim 800-160 MG tablet Commonly known as:  BACTRIM DS,SEPTRA DS Replaced by:  sulfamethoxazole-trimethoprim 400-80 MG tablet     TAKE these medications   AMBULATORY NON FORMULARY MEDICATION Please check INR weekly for 2 months. Send reports  to Dr Denyse Amass fax [phone]   apixaban 5 MG Tabs tablet Commonly known as:  ELIQUIS Take 1 tablet (5 mg total) by mouth 2 (two) times daily.   Calcium Carb-Cholecalciferol 600-800 MG-UNIT Tabs TAKE ONE TABLET BY MOUTH 2 TIMES A DAY What changed:  See the new instructions.   clonazePAM 0.5 MG tablet Commonly known as:  KLONOPIN Take 1 tablet (0.5 mg total) by mouth at bedtime.   Cranberry 500 MG Caps Take 500 mg by mouth every morning.   diltiazem 180 MG 24 hr capsule Commonly known as:  CARDIZEM CD Take 1 capsule (180 mg total) by mouth at bedtime.   Ferrous Bisglycinate Chelate 15 MG Tabs Take 1 tablet by mouth 2 (two) times daily.   folic acid 1 MG tablet Commonly known as:  FOLVITE Take 1 tablet (1 mg total) by mouth daily.   glucose blood test strip Once daily E11.29   levothyroxine 50 MCG tablet Commonly known as:  SYNTHROID, LEVOTHROID Take 1 tablet (50 mcg total) by mouth daily.   methotrexate 2.5 MG tablet Commonly known as:  RHEUMATREX Take 20 mg by mouth every Friday. Caution:Chemotherapy. Protect from light.   metoprolol succinate 50 MG 24 hr tablet Commonly known as:  TOPROL-XL Take 1 tablet (50 mg total) by mouth daily.   PARoxetine 10 MG tablet Commonly known as:  PAXIL Take 1 tablet (10 mg total) by mouth daily.   pravastatin 40 MG tablet Commonly known as:  PRAVACHOL TAKE 1 TABLET (40 MG TOTAL) BY MOUTH DAILY.   PREMARIN vaginal cream Generic drug:  conjugated estrogens PLACE 1 APPLICATORFUL VAGINALLY ONCE A DAY What changed:  See the new instructions.   STOOL SOFTENER 100 MG capsule Generic drug:  docusate sodium TAKE 1 CAPSULE BY MOUTH 2 TIMES A DAY (AM & PM) What changed:  See the new instructions.   sulfamethoxazole-trimethoprim 400-80 MG tablet Commonly known as:  BACTRIM,SEPTRA Take 1 tablet by mouth every 12 (twelve) hours for 6 doses. Replaces:  sulfamethoxazole-trimethoprim 800-160 MG tablet   torsemide 100 MG  tablet Commonly known as:  DEMADEX Take 0.5 tablets (50 mg total) by mouth 2 (two) times daily.   traMADol 50 MG tablet Commonly known as:  ULTRAM Take 1 tablet (50 mg total) by mouth 2 (two) times daily. What changed:  See the new instructions.   traZODone 50 MG tablet Commonly known as:  DESYREL Take 0.5-1 tablets (25-50 mg total) by mouth at bedtime as needed for sleep.   vitamin B-12 1000 MCG tablet Commonly known as:  CYANOCOBALAMIN Take 1,000 mcg by mouth daily.   Vitamin D2 2000 units Tabs Take 2,000 Units by mouth daily.       Contact information for follow-up providers    Rodolph Bong, MD.   Specialties:  Family Medicine, Emergency Medicine Contact information: [phone] [place] [address] [phone]            Contact information for after-discharge care    Destination    [facility] Preferred SNF .   Service:  Skilled Paramedic information: [address] [place] [address] [phone]  Allergies  Allergen Reactions  . Cefuroxime Anaphylaxis and Other (See Comments)    Throat swelling  . Fish Allergy Shortness Of Breath    Pt said she has throat swelling   . Ciprofloxacin Rash  . Codeine Other (See Comments)    Jittery Jittery  . Sertraline Hcl Rash  . Cephalexin Rash    Procedures/Studies: Dg Chest 1 View  Result Date: 01/17/2018 CLINICAL DATA:  Fall.  Pain EXAM: CHEST  1 VIEW COMPARISON:  02/14/2017 FINDINGS: Postop CABG. Mild cardiac enlargement without heart failure. Hypoventilation. Decreased lung volume with mild atelectasis in the bases. Negative for pneumonia or effusion IMPRESSION: Mild bibasilar atelectasis.  Negative for pneumonia Electronically Signed   By: Marlan Palau M.D.   On: 01/17/2018 11:57   Ct Head Wo Contrast  Result Date: 01/17/2018 CLINICAL DATA:  Altered level of consciousness. Found on floor. Recent fall EXAM: CT HEAD  WITHOUT CONTRAST TECHNIQUE: Contiguous axial images were obtained from the base of the skull through the vertex without intravenous contrast. COMPARISON:  None. FINDINGS: Brain: No evidence of acute infarction, hemorrhage, hydrocephalus, extra-axial collection or mass lesion/mass effect. Chronic small vessel ischemia with extensive gliosis in the cerebral white matter and heterogeneous deep gray nuclei. Prominent sulcus in the parasagittal left occipital lobe, question remote infarct in this location. Vascular: Atherosclerotic calcification Skull: Negative for fracture. Partially covered advanced degenerative disease at the craniocervical junction. Sinuses/Orbits: No evidence of injury.  Left cataract resection IMPRESSION: No acute finding. Advanced chronic small vessel ischemia. Electronically Signed   By: Marnee Spring M.D.   On: 01/17/2018 12:23   Dg Knee Complete 4 Views Right  Result Date: 01/17/2018 CLINICAL DATA:  Fall.  Pain EXAM: RIGHT KNEE - COMPLETE 4+ VIEW COMPARISON:  None. FINDINGS: Total knee replacement in satisfactory position. No fracture identified. Mild arterial calcification. IMPRESSION: Negative for fracture Electronically Signed   By: Marlan Palau M.D.   On: 01/17/2018 11:58   Dg Hip Unilat With Pelvis 2-3 Views Right  Result Date: 01/17/2018 CLINICAL DATA:  Fall, right hip pain EXAM: DG HIP (WITH OR WITHOUT PELVIS) 2-3V RIGHT COMPARISON:  None. FINDINGS: Right hip joint shows mild joint space narrowing. Negative for fracture or AVN. No pelvic fracture Chronic fracture left femur with surgical fixation External urinary catheter. IMPRESSION: Negative for acute fracture. Electronically Signed   By: Marlan Palau M.D.   On: 01/17/2018 11:55     Subjective: Without complaints  Discharge Exam: Vitals:   01/19/18 0442 01/19/18 0727  BP: (!) 168/78 (!) 156/82  Pulse: 81 76  Resp: 18 18  Temp: 98.3 F (36.8 C) 98 F (36.7 C)  SpO2: 94% 92%   Vitals:   01/18/18 1635  01/18/18 2101 01/19/18 0442 01/19/18 0727  BP: (!) 146/75 (!) 145/64 (!) 168/78 (!) 156/82  Pulse: 61 61 81 76  Resp: 18 18 18 18   Temp: 98 F (36.7 C) 98.7 F (37.1 C) 98.3 F (36.8 C) 98 F (36.7 C)  TempSrc: Oral Oral Oral Oral  SpO2: 93% 92% 94% 92%  Weight:  66.7 kg      General: Pt is alert, awake, not in acute distress Cardiovascular: RRR, S1/S2 +, no rubs, no gallops Respiratory: CTA bilaterally, no wheezing, no rhonchi Abdominal: Soft, NT, ND, bowel sounds + Extremities: no edema, no cyanosis   The results of significant diagnostics from this hospitalization (including imaging, microbiology, ancillary and laboratory) are listed below for reference.     Microbiology: Recent Results (from the past 240  hour(s))  Urine Culture     Status: Abnormal   Collection Time: 01/15/18  2:43 PM  Result Value Ref Range Status   MICRO NUMBER: [id number]  Final   SPECIMEN QUALITY: ADEQUATE  Final   Sample Source NOT GIVEN  Final   STATUS: FINAL  Final   ISOLATE 1: Klebsiella oxytoca (A)  Final    Comment: Greater than 100,000 CFU/mL of Klebsiella oxytoca      Susceptibility   Klebsiella oxytoca - URINE CULTURE, REFLEX    AMOX/CLAVULANIC <=2 Sensitive     AMPICILLIN >=32 Resistant     AMPICILLIN/SULBACTAM 8 Sensitive     CEFAZOLIN* >=64 Resistant      * For uncomplicated UTI caused by E. coli,K. pneumoniae or P. mirabilis: Cefazolin issusceptible if MIC <32 mcg/mL and predictssusceptible to the oral agents cefaclor, cefdinir,cefpodoxime, cefprozil, cefuroxime, cephalexinand loracarbef.    CEFEPIME <=1 Sensitive     CEFTRIAXONE <=1 Sensitive     CIPROFLOXACIN <=0.25 Sensitive     LEVOFLOXACIN <=0.12 Sensitive     ERTAPENEM <=0.5 Sensitive     GENTAMICIN <=1 Sensitive     IMIPENEM <=0.25 Sensitive     NITROFURANTOIN <=16 Sensitive     PIP/TAZO 8 Sensitive     TOBRAMYCIN <=1 Sensitive     TRIMETH/SULFA* <=20 Sensitive      * For uncomplicated UTI caused by E. coli,K. pneumoniae  or P. mirabilis: Cefazolin issusceptible if MIC <32 mcg/mL and predictssusceptible to the oral agents cefaclor, cefdinir,cefpodoxime, cefprozil, cefuroxime, cephalexinand loracarbef.Legend:S = Susceptible  I = IntermediateR = Resistant  NS = Not susceptible* = Not tested  NR = Not reported**NN = See antimicrobic comments  MRSA PCR Screening     Status: None   Collection Time: 01/17/18  6:44 PM  Result Value Ref Range Status   MRSA by PCR NEGATIVE NEGATIVE Final    Comment:        The GeneXpert MRSA Assay (FDA approved for NASAL specimens only), is one component of a comprehensive MRSA colonization surveillance program. It is not intended to diagnose MRSA infection nor to guide or monitor treatment for MRSA infections. Performed at [facility], [address], [place], [place] [address]      Labs: BNP (last 3 results) No results for input(s): BNP in the last 8760 hours. Basic Metabolic Panel: Recent Labs  Lab 01/17/18 1100 01/18/18 0535 01/19/18 0426  NA 132* 134* 138  K 3.8 3.8 3.8  CL 92* 99 104  CO2 26 26 25   GLUCOSE 172* 123* 110*  BUN 30* 25* 23  CREATININE 1.95* 1.58* 1.25*  CALCIUM 9.8 8.9 8.7*   Liver Function Tests: No results for input(s): AST, ALT, ALKPHOS, BILITOT, PROT, ALBUMIN in the last 168 hours. No results for input(s): LIPASE, AMYLASE in the last 168 hours. No results for input(s): AMMONIA in the last 168 hours. CBC: Recent Labs  Lab 01/17/18 1100 01/18/18 0535  WBC 4.0 3.9*  NEUTROABS 2.7  --   HGB 10.9* 10.3*  HCT 33.9* 31.6*  MCV 97.4 98.8  PLT 357 335   Cardiac Enzymes: Recent Labs  Lab 01/17/18 1618  CKTOTAL 569*   BNP: Invalid input(s): POCBNP CBG: Recent Labs  Lab 01/18/18 0755 01/18/18 1128 01/18/18 1632 01/18/18 2108 01/19/18 0726  GLUCAP 111* 165* 150* 154* 108*   D-Dimer No results for input(s): DDIMER in the last 72 hours. Hgb A1c No results for input(s): HGBA1C in the last 72 hours. Lipid Profile No  results for input(s):  CHOL, HDL, LDLCALC, TRIG, CHOLHDL, LDLDIRECT in the last 72 hours. Thyroid function studies Recent Labs    01/17/18 1618  TSH 3.237   Anemia work up No results for input(s): VITAMINB12, FOLATE, FERRITIN, TIBC, IRON, RETICCTPCT in the last 72 hours. Urinalysis    Component Value Date/Time   COLORURINE STRAW (A) 11/20/2016 1125   APPEARANCEUR CLEAR 11/20/2016 1125   LABSPEC 1.005 11/20/2016 1125   PHURINE 6.0 11/20/2016 1125   GLUCOSEU NEGATIVE 11/20/2016 1125   GLUCOSEU NEG mg/dL 29/79/8921 1941   HGBUR NEGATIVE 11/20/2016 1125   BILIRUBINUR NEGATIUVE 01/15/2018 1428   KETONESUR negative 09/10/2017 1710   KETONESUR NEGATIVE 11/20/2016 1125   PROTEINUR Positive (A) 01/15/2018 1428   PROTEINUR NEGATIVE 11/20/2016 1125   UROBILINOGEN 1.0 01/15/2018 1428   UROBILINOGEN 0.2 03/07/2010 1517   NITRITE NEGATIVE' 01/15/2018 1428   NITRITE NEGATIVE 11/20/2016 1125   LEUKOCYTESUR Large (3+) (A) 01/15/2018 1428   Sepsis Labs Invalid input(s): PROCALCITONIN,  WBC,  LACTICIDVEN Microbiology Recent Results (from the past 240 hour(s))  Urine Culture     Status: Abnormal   Collection Time: 01/15/18  2:43 PM  Result Value Ref Range Status   MICRO NUMBER: [id number]  Final   SPECIMEN QUALITY: ADEQUATE  Final   Sample Source NOT GIVEN  Final   STATUS: FINAL  Final   ISOLATE 1: Klebsiella oxytoca (A)  Final    Comment: Greater than 100,000 CFU/mL of Klebsiella oxytoca      Susceptibility   Klebsiella oxytoca - URINE CULTURE, REFLEX    AMOX/CLAVULANIC <=2 Sensitive     AMPICILLIN >=32 Resistant     AMPICILLIN/SULBACTAM 8 Sensitive     CEFAZOLIN* >=64 Resistant      * For uncomplicated UTI caused by E. coli,K. pneumoniae or P. mirabilis: Cefazolin issusceptible if MIC <32 mcg/mL and predictssusceptible to the oral agents cefaclor, cefdinir,cefpodoxime, cefprozil, cefuroxime, cephalexinand loracarbef.    CEFEPIME <=1 Sensitive     CEFTRIAXONE <=1 Sensitive      CIPROFLOXACIN <=0.25 Sensitive     LEVOFLOXACIN <=0.12 Sensitive     ERTAPENEM <=0.5 Sensitive     GENTAMICIN <=1 Sensitive     IMIPENEM <=0.25 Sensitive     NITROFURANTOIN <=16 Sensitive     PIP/TAZO 8 Sensitive     TOBRAMYCIN <=1 Sensitive     TRIMETH/SULFA* <=20 Sensitive      * For uncomplicated UTI caused by E. coli,K. pneumoniae or P. mirabilis: Cefazolin issusceptible if MIC <32 mcg/mL and predictssusceptible to the oral agents cefaclor, cefdinir,cefpodoxime, cefprozil, cefuroxime, cephalexinand loracarbef.Legend:S = Susceptible  I = IntermediateR = Resistant  NS = Not susceptible* = Not tested  NR = Not reported**NN = See antimicrobic comments  MRSA PCR Screening     Status: None   Collection Time: 01/17/18  6:44 PM  Result Value Ref Range Status   MRSA by PCR NEGATIVE NEGATIVE Final    Comment:        The GeneXpert MRSA Assay (FDA approved for NASAL specimens only), is one component of a comprehensive MRSA colonization surveillance program. It is not intended to diagnose MRSA infection nor to guide or monitor treatment for MRSA infections. Performed at [facility], [address], [place], [place] [address]    Time spent: 30 min  SIGNED:   Rickey Medina, MD  [facility] 01/19/2018, 9:47 AM  If 7PM-7AM, please contact night-coverage

## 2018-01-19 NOTE — Consult Note (Signed)
            [facility] CM Primary Care Navigator  01/19/2018  [name] 1929/07/08 [id number]   Went to see patientat the bedsideto identify possible discharge needs butshewasalreadydischarged perstaff.  Patientwas discharged to skilled nursing facility per therapy recommendation (SNF- [facility]).  Per MD note,patient presented with fall episode, confusion, urinary frequency and dysuria. (acute kidney injury, frequent UTI)  Patient has discharge instruction to follow-up with primary care provider in 1-2 weeks. Noted appointment schedule with primary care provider in 6 days.   For additional questions please contact:  Karin Golden A. Shterna Laramee, BSN, RN-BC [facility] PRIMARY CARE Navigator Cell: [phone]

## 2018-01-19 NOTE — Evaluation (Signed)
Occupational Therapy Evaluation Patient Details Name: [name] MRN: [id number] DOB: November 18, 1929 Today's Date: 01/19/2018    History of Present Illness Pt is a 84 y.o. F with significant PMH of hypothyroidism, PVD, mesenteric ischemia, HTN, HLD, DM, CHF, CAD, and afib who presents after a fall. CT head negative acute abnormality. Imaging negative for acute fracture.    Clinical Impression   Pt admitted with the above diagnoses and presents with below problem list. Pt will benefit from continued acute OT to address the below listed deficits and maximize independence with basic ADLs prior to d/c to venue below. PTA pt was mod I with ADLs. Pt is currently mod A with UB/LB bathing/dressing and functional transfers.      Follow Up Recommendations  SNF    Equipment Recommendations  Other (comment)(defer to next venue)    Recommendations for Other Services       Precautions / Restrictions Precautions Precautions: Fall Restrictions Weight Bearing Restrictions: No      Mobility Bed Mobility Overal bed mobility: Needs Assistance Bed Mobility: Supine to Sit     Supine to sit: Mod assist     General bed mobility comments: Mod assist to elevate trunk to sitting  Transfers Overall transfer level: Needs assistance Equipment used: Rolling walker (2 wheeled) Transfers: Sit to/from Stand Sit to Stand: Mod assist         General transfer comment: mod A to power up and steady.     Balance Overall balance assessment: Needs assistance Sitting-balance support: No upper extremity supported;Feet supported Sitting balance-Leahy Scale: Fair       Standing balance-Leahy Scale: Poor                             ADL either performed or assessed with clinical judgement   ADL Overall ADL's : Needs assistance/impaired Eating/Feeding: Set up;Sitting   Grooming: Set up;Sitting;Minimal assistance   Upper Body Bathing: Moderate assistance;Sitting   Lower Body  Bathing: Moderate assistance;Sit to/from stand   Upper Body Dressing : Moderate assistance;Sitting   Lower Body Dressing: Moderate assistance;Sit to/from stand   Toilet Transfer: Minimal assistance;Ambulation;RW   Toileting- Clothing Manipulation and Hygiene: Maximal assistance;Sit to/from stand Toileting - Clothing Manipulation Details (indicate cue type and reason): pt stood with rw and therapist completed pericare Tub/ Shower Transfer: Minimal assistance   Functional mobility during ADLs: Minimal assistance;Rolling walker General ADL Comments: Bed mobility, donned socks. short distance amb to walk to recliner as detailed above     Vision         Perception     Praxis      Pertinent Vitals/Pain Pain Assessment: No/denies pain     Hand Dominance     Extremity/Trunk Assessment Upper Extremity Assessment Upper Extremity Assessment: Generalized weakness;RUE deficits/detail;LUE deficits/detail RUE Deficits / Details: Impaired grip strength due to RA and difficulty holding onto walker LUE Deficits / Details: impaired grip strength due to RA   Lower Extremity Assessment Lower Extremity Assessment: Defer to PT evaluation   Cervical / Trunk Assessment Cervical / Trunk Assessment: Kyphotic   Communication Communication Communication: No difficulties   Cognition Arousal/Alertness: Awake/alert Behavior During Therapy: WFL for tasks assessed/performed Overall Cognitive Status: Impaired/Different from baseline Area of Impairment: Following commands;Awareness;Problem solving;Orientation;Attention                   Current Attention Level: Sustained   Following Commands: Follows one step commands with increased time   Awareness: Emergent  Problem Solving: Slow processing;Decreased initiation;Difficulty sequencing;Requires verbal cues;Requires tactile cues General Comments: Slow processing   General Comments       Exercises     Shoulder Instructions      Home  Living Family/patient expects to be discharged to:: Other (Comment) Living Arrangements: Alone Available Help at Discharge: Available PRN/intermittently;Family Type of Home: Independent living facility Home Access: Level entry     Home Layout: One level               Home Equipment: Walker - 4 wheels;Walker - 2 wheels          Prior Functioning/Environment Level of Independence: Independent with assistive device(s)        Comments: uses Rollator for mobility        OT Problem List: Decreased activity tolerance;Impaired balance (sitting and/or standing);Decreased knowledge of use of DME or AE;Decreased knowledge of precautions;Decreased cognition      OT Treatment/Interventions: Self-care/ADL training;Energy conservation;DME and/or AE instruction;Therapeutic activities;Patient/family education;Balance training    OT Goals(Current goals can be found in the care plan section) Acute Rehab OT Goals Patient Stated Goal: ST rehab at SNF then back to ILF OT Goal Formulation: With patient Time For Goal Achievement: 01/26/18 Potential to Achieve Goals: Good ADL Goals Pt Will Perform Upper Body Bathing: with set-up;sitting Pt Will Perform Lower Body Bathing: sit to/from stand;with min guard assist Pt Will Perform Upper Body Dressing: with set-up;sitting Pt Will Perform Lower Body Dressing: with min guard assist;sit to/from stand Pt Will Transfer to Toilet: with min guard assist;ambulating Pt Will Perform Toileting - Clothing Manipulation and hygiene: with min assist;sit to/from stand Additional ADL Goal #1: Pt will complete bed mobility at min guard level to prepare for OOB ADLs.  OT Frequency: Min 2X/week   Barriers to D/C:            Co-evaluation              AM-PAC PT "6 Clicks" Daily Activity     Outcome Measure Help from another person eating meals?: None Help from another person taking care of personal grooming?: A Little Help from another person  toileting, which includes using toliet, bedpan, or urinal?: A Lot Help from another person bathing (including washing, rinsing, drying)?: A Lot Help from another person to put on and taking off regular upper body clothing?: A Little Help from another person to put on and taking off regular lower body clothing?: A Lot 6 Click Score: 16   End of Session Equipment Utilized During Treatment: Gait belt;Rolling walker Nurse Communication: Mobility status  Activity Tolerance: Patient limited by fatigue;Patient tolerated treatment well Patient left: in chair;with call bell/phone within reach;with chair alarm set  OT Visit Diagnosis: Unsteadiness on feet (R26.81);Other abnormalities of gait and mobility (R26.89);Muscle weakness (generalized) (M62.81);Other symptoms and signs involving cognitive function                Time: 1243-1305 OT Time Calculation (min): 22 min Charges:  OT General Charges $OT Visit: 1 Visit OT Evaluation $OT Eval Low Complexity: 1 Low  Raynald Kemp, OT Acute Rehabilitation Services Pager: [phone] Office: [phone]   Pilar Grammes 01/19/2018, 1:23 PM

## 2018-01-19 NOTE — Progress Notes (Signed)
Report given to RN, Burnett Harry at [facility]. Patient's AVS has been printed along with her prescriptions. Family has been called about patient's tranSfer method. Patient belongings with patient's family. IV taken out.   Lillia Pauls RN

## 2018-01-21 NOTE — ED Provider Notes (Signed)
[facility] Provider Note   CSN: [id number] Arrival date & time: 01/17/18  1003     History   Chief Complaint Chief Complaint  Patient presents with  . Fall    HPI [name] is a 84 y.o. female.  HPI Patient is an 84 year old female who is brought to the emergency department after being found on the floor this morning at her independent living facility.  Is unsure how long she had been there.  She complains of right leg and right hip and right knee pain.  Recently on antibiotics for UTI over the past several days.  She does take Eliquis.  No significant altered mental status per the family.  Based on mental status.  No new medications.  Symptoms are mild to moderate in severity.  Majority of pain is located in the right knee and the right lateral thigh   Past Medical History:  Diagnosis Date  . Acute pancreatitis   . Anemia   . Arthritis   . Atrial fibrillation (HCC)   . CAD (coronary artery disease)   . Carotid artery occlusion   . CHF (congestive heart failure) (HCC)   . Diabetes mellitus age 64  . DJD (degenerative joint disease)   . DVT (deep venous thrombosis) (HCC)   . GERD (gastroesophageal reflux disease)   . GI bleed   . Hyperlipidemia   . Hypertension   . Joint pain   . Mesenteric ischemia   . Peripheral vascular disease (HCC)   . Thyroid disease     Patient Active Problem List   Diagnosis Date Noted  . AKI (acute kidney injury) (HCC) 01/17/2018  . MDD (major depressive disorder) 01/15/2018  . Macrocytic anemia 09/22/2017  . Proteinuria 04/06/2017  . Insomnia 12/01/2016  . CAD (coronary artery disease) 11/17/2016  . Hyperlipidemia 11/17/2016  . Hypertension 11/17/2016  . Aortic atherosclerosis (HCC) 11/03/2016  . CKD (chronic kidney disease) stage 3, GFR 30-59 ml/min (HCC) 04/14/2015  . Colonic constipation 04/14/2015  . Frequent UTI 04/01/2015  . Diabetes mellitus with renal complications (HCC) 03/03/2015  . Atrophic  vaginitis 02/24/2015  . Rheumatoid arthritis (HCC) 02/18/2015  . DNR (do not resuscitate) 02/13/2015  . Occlusion and stenosis of carotid artery without mention of cerebral infarction 05/02/2013  . 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 FEMORAL BRUIT 11/24/2009  . Hypothyroidism 06/26/2009  . CEREBROVASCULAR DISEASE 06/25/2009  . ATRIAL FIBRILLATION 05/19/2008  . Chronic diastolic heart failure (HCC) 05/19/2008  . RENAL ARTERY STENOSIS 05/19/2008  . PERIPHERAL VASCULAR DISEASE 05/19/2008    Past Surgical History:  Procedure Laterality Date  . APPENDECTOMY    . CELIAC ARTERY STENT    . CORONARY ARTERY BYPASS GRAFT    . EMBOLECTOMY  2009   right leg  . FASCIOTOMY     right leg  . HIP FRACTURE SURGERY Left   . JOINT REPLACEMENT    . TOTAL KNEE ARTHROPLASTY     bilateral  . TUBAL LIGATION    . VISCERAL ANGIOGRAM N/A 05/31/2013   Procedure: MESSENTERIC [name];  Surgeon: [name], MD;  Location: [facility] CATH LAB;  Service: Cardiovascular;  Laterality: N/A;     OB History   None      Home Medications    Prior to Admission medications   Medication Sig Start Date End Date Taking? Authorizing Provider  apixaban (ELIQUIS) 5 MG TABS tablet Take 1 tablet (5 mg total) by mouth 2 (two) times daily. 11/21/17  Yes [name]  S, MD  Calcium Carb-Cholecalciferol 600-800 MG-UNIT TABS TAKE ONE TABLET BY MOUTH 2 TIMES A DAY Patient taking differently: Take 1 tablet by mouth 2 (two) times daily.  09/11/15  Yes Rodolph Bong, MD  Cranberry 500 MG CAPS Take 500 mg by mouth every morning.   Yes [provider]  diltiazem (CARDIZEM CD) 180 MG 24 hr capsule Take 1 capsule (180 mg total) by mouth at bedtime. 05/24/17  Yes Rodolph Bong, MD  Ergocalciferol (VITAMIN D2) 2000 units TABS Take 2,000 Units by mouth daily.    Yes [provider]  Ferrous Bisglycinate Chelate 15 MG TABS Take 1 tablet by mouth 2 (two) times daily. 10/20/17  Yes Rodolph Bong, MD  folic  acid (FOLVITE) 1 MG tablet Take 1 tablet (1 mg total) by mouth daily. 05/24/17  Yes Rodolph Bong, MD  levothyroxine (SYNTHROID, LEVOTHROID) 50 MCG tablet Take 1 tablet (50 mcg total) by mouth daily. 05/24/17  Yes Rodolph Bong, MD  methotrexate (RHEUMATREX) 2.5 MG tablet Take 20 mg by mouth every Friday. Caution:Chemotherapy. Protect from light.   Yes [provider]  metoprolol succinate (TOPROL-XL) 50 MG 24 hr tablet Take 1 tablet (50 mg total) by mouth daily. 05/24/17  Yes Rodolph Bong, MD  pravastatin (PRAVACHOL) 40 MG tablet TAKE 1 TABLET (40 MG TOTAL) BY MOUTH DAILY. 01/03/18  Yes Rodolph Bong, MD  PREMARIN vaginal cream PLACE 1 APPLICATORFUL VAGINALLY ONCE A DAY Patient taking differently: Place 1 Applicatorful vaginally daily.  12/08/17  Yes Rodolph Bong, MD  STOOL SOFTENER 100 MG capsule TAKE 1 CAPSULE BY MOUTH 2 TIMES A DAY (AM & PM) Patient taking differently: Take 100 mg by mouth 2 (two) times daily.  09/11/15  Yes Rodolph Bong, MD  torsemide (DEMADEX) 100 MG tablet Take 0.5 tablets (50 mg total) by mouth 2 (two) times daily. 05/24/17  Yes Rodolph Bong, MD  traZODone (DESYREL) 50 MG tablet Take 0.5-1 tablets (25-50 mg total) by mouth at bedtime as needed for sleep. 12/01/16  Yes Rodolph Bong, MD  vitamin B-12 (CYANOCOBALAMIN) 1000 MCG tablet Take 1,000 mcg by mouth daily.   Yes [provider]  AMBULATORY NON FORMULARY MEDICATION Please check INR weekly for 2 months. Send reports to Dr Denyse Amass fax [phone] 12/19/16   Rodolph Bong, MD  clonazePAM (KLONOPIN) 0.5 MG tablet Take 1 tablet (0.5 mg total) by mouth at bedtime. 01/19/18   Jerald Kief, MD  glucose blood test strip Once daily E11.29 03/03/15   Rodolph Bong, MD  PARoxetine (PAXIL) 10 MG tablet Take 1 tablet (10 mg total) by mouth daily. 01/16/18   Rodolph Bong, MD  sulfamethoxazole-trimethoprim (BACTRIM,SEPTRA) 400-80 MG tablet Take 1 tablet by mouth every 12 (twelve) hours for 6 doses. 01/19/18 01/22/18   Jerald Kief, MD  traMADol (ULTRAM) 50 MG tablet Take 1 tablet (50 mg total) by mouth 2 (two) times daily. 01/19/18   Jerald Kief, MD    Family History Family History  Problem Relation Age of Onset  . Cancer Mother        ovarian  . Other Father        suicide  . Coronary artery disease Sister   . Other Sister        alzheimers  . Heart disease Sister        Before age 51  . Diabetes Sister   . Cancer Daughter  spinal tumor  . Diabetes Daughter   . Hypertension Daughter   . Diabetes Son   . Hyperlipidemia Son   . Hypertension Son     Social History Social History   Tobacco Use  . Smoking status: Never Smoker  . Smokeless tobacco: Never Used  Substance Use Topics  . Alcohol use: No  . Drug use: No     Allergies   Cefuroxime; Fish allergy; Ciprofloxacin; Codeine; Sertraline hcl; and Cephalexin   Review of Systems Review of Systems  All other systems reviewed and are negative.    Physical Exam Updated Vital Signs BP (!) 156/82 (BP Location: Left Arm)   Pulse 76   Temp 98 F (36.7 C) (Oral)   Resp 18   Wt 66.7 kg   SpO2 92%   BMI 26.90 kg/m   Physical Exam  Constitutional: She is oriented to person, place, and time. She appears well-developed and well-nourished. No distress.  HENT:  Head: Normocephalic and atraumatic.  Eyes: EOM are normal.  Neck: Normal range of motion.  Cardiovascular: Normal rate, regular rhythm and normal heart sounds.  Pulmonary/Chest: Effort normal and breath sounds normal.  Abdominal: Soft. She exhibits no distension. There is no tenderness.  Musculoskeletal: Normal range of motion.  Mild pain with range of motion of the right hip and right knee without obvious deformity.  No external rotation or shortening of the right lower extremity as compared to left.  Normal PT and DP pulses bilaterally.  Neurological: She is alert and oriented to person, place, and time.  Skin: Skin is warm and dry.  Psychiatric: She has  a normal mood and affect. Judgment normal.  Nursing note and vitals reviewed.    ED Treatments / Results  Labs (all labs ordered are listed, but only abnormal results are displayed) Labs Reviewed  BASIC METABOLIC PANEL - Abnormal; Notable for the following components:      Result Value   Sodium 132 (*)    Chloride 92 (*)    Glucose, Bld 172 (*)    BUN 30 (*)    Creatinine, Ser 1.95 (*)    GFR calc non Af Amer 22 (*)    GFR calc Af Amer 26 (*)    All other components within normal limits  CBC WITH DIFFERENTIAL/PLATELET - Abnormal; Notable for the following components:   RBC 3.48 (*)    Hemoglobin 10.9 (*)    HCT 33.9 (*)    RDW 16.8 (*)    All other components within normal limits  PROTIME-INR - Abnormal; Notable for the following components:   Prothrombin Time 24.9 (*)    All other components within normal limits  CK - Abnormal; Notable for the following components:   Total CK 569 (*)    All other components within normal limits  BASIC METABOLIC PANEL - Abnormal; Notable for the following components:   Sodium 134 (*)    Glucose, Bld 123 (*)    BUN 25 (*)    Creatinine, Ser 1.58 (*)    GFR calc non Af Amer 29 (*)    GFR calc Af Amer 33 (*)    All other components within normal limits  CBC - Abnormal; Notable for the following components:   WBC 3.9 (*)    RBC 3.20 (*)    Hemoglobin 10.3 (*)    HCT 31.6 (*)    RDW 17.3 (*)    All other components within normal limits  GLUCOSE, CAPILLARY - Abnormal; Notable for the  following components:   Glucose-Capillary 111 (*)    All other components within normal limits  GLUCOSE, CAPILLARY - Abnormal; Notable for the following components:   Glucose-Capillary 165 (*)    All other components within normal limits  GLUCOSE, CAPILLARY - Abnormal; Notable for the following components:   Glucose-Capillary 150 (*)    All other components within normal limits  BASIC METABOLIC PANEL - Abnormal; Notable for the following components:    Glucose, Bld 110 (*)    Creatinine, Ser 1.25 (*)    Calcium 8.7 (*)    GFR calc non Af Amer 38 (*)    GFR calc Af Amer 44 (*)    All other components within normal limits  GLUCOSE, CAPILLARY - Abnormal; Notable for the following components:   Glucose-Capillary 154 (*)    All other components within normal limits  GLUCOSE, CAPILLARY - Abnormal; Notable for the following components:   Glucose-Capillary 108 (*)    All other components within normal limits  GLUCOSE, CAPILLARY - Abnormal; Notable for the following components:   Glucose-Capillary 183 (*)    All other components within normal limits  CBG MONITORING, ED - Abnormal; Notable for the following components:   Glucose-Capillary 173 (*)    All other components within normal limits  CBG MONITORING, ED - Abnormal; Notable for the following components:   Glucose-Capillary 116 (*)    All other components within normal limits  MRSA PCR SCREENING  TSH  TYPE AND SCREEN    EKG EKG Interpretation  Date/Time:  Wednesday January 17 2018 10:18:24 EDT Ventricular Rate:  95 PR Interval:    QRS Duration: 120 QT Interval:  369 QTC Calculation: 464 R Axis:   63 Text Interpretation:  Atrial fibrillation Left ventricular hypertrophy ST depr, consider ischemia, anterolateral lds No significant change was found Confirmed by Azalia Bilis ([id number]) on 01/17/2018 11:10:30 AM   Radiology No results found.  Procedures Procedures (including critical care time)  Medications Ordered in ED Medications  sodium chloride 0.9 % bolus 1,000 mL (0 mLs Intravenous Stopped 01/17/18 1356)     Initial Impression / Assessment and Plan / ED Course  I have reviewed the triage vital signs and the nursing notes.  Pertinent labs & imaging results that were available during my care of the patient were reviewed by me and considered in my medical decision making (see chart for details).     Given persistence of UTI and altered mental status patient was  admitted to hospital for further work-up and evaluation.  Final Clinical Impressions(s) / ED Diagnoses   Final diagnoses:  Generalized weakness  Fall, initial encounter  Acute kidney injury (HCC)  Acute cystitis without hematuria       Azalia Bilis, MD 01/21/18 2234

## 2018-01-22 ENCOUNTER — Ambulatory Visit: Payer: Medicare PPO | Admitting: Family Medicine

## 2018-01-22 DIAGNOSIS — M6281 Muscle weakness (generalized): Secondary | ICD-10-CM | POA: Diagnosis not present

## 2018-01-22 DIAGNOSIS — E1169 Type 2 diabetes mellitus with other specified complication: Secondary | ICD-10-CM | POA: Diagnosis not present

## 2018-01-22 DIAGNOSIS — Z7901 Long term (current) use of anticoagulants: Secondary | ICD-10-CM | POA: Diagnosis not present

## 2018-01-22 DIAGNOSIS — M79671 Pain in right foot: Secondary | ICD-10-CM | POA: Diagnosis not present

## 2018-01-22 DIAGNOSIS — N390 Urinary tract infection, site not specified: Secondary | ICD-10-CM | POA: Diagnosis not present

## 2018-01-22 DIAGNOSIS — M609 Myositis, unspecified: Secondary | ICD-10-CM | POA: Diagnosis not present

## 2018-01-22 DIAGNOSIS — D631 Anemia in chronic kidney disease: Secondary | ICD-10-CM | POA: Diagnosis not present

## 2018-01-22 DIAGNOSIS — M868X7 Other osteomyelitis, ankle and foot: Secondary | ICD-10-CM | POA: Diagnosis not present

## 2018-01-22 DIAGNOSIS — L089 Local infection of the skin and subcutaneous tissue, unspecified: Secondary | ICD-10-CM | POA: Diagnosis not present

## 2018-01-22 DIAGNOSIS — I129 Hypertensive chronic kidney disease with stage 1 through stage 4 chronic kidney disease, or unspecified chronic kidney disease: Secondary | ICD-10-CM | POA: Diagnosis not present

## 2018-01-22 DIAGNOSIS — M86171 Other acute osteomyelitis, right ankle and foot: Secondary | ICD-10-CM | POA: Diagnosis not present

## 2018-01-22 DIAGNOSIS — I10 Essential (primary) hypertension: Secondary | ICD-10-CM | POA: Diagnosis not present

## 2018-01-22 DIAGNOSIS — K9420 Gastrostomy complication, unspecified: Secondary | ICD-10-CM | POA: Diagnosis not present

## 2018-01-22 DIAGNOSIS — I4891 Unspecified atrial fibrillation: Secondary | ICD-10-CM | POA: Diagnosis not present

## 2018-01-22 DIAGNOSIS — L03115 Cellulitis of right lower limb: Secondary | ICD-10-CM | POA: Diagnosis not present

## 2018-01-22 DIAGNOSIS — Z951 Presence of aortocoronary bypass graft: Secondary | ICD-10-CM | POA: Diagnosis not present

## 2018-01-22 DIAGNOSIS — E11621 Type 2 diabetes mellitus with foot ulcer: Secondary | ICD-10-CM | POA: Diagnosis not present

## 2018-01-22 DIAGNOSIS — M069 Rheumatoid arthritis, unspecified: Secondary | ICD-10-CM | POA: Diagnosis not present

## 2018-01-22 DIAGNOSIS — K219 Gastro-esophageal reflux disease without esophagitis: Secondary | ICD-10-CM | POA: Diagnosis not present

## 2018-01-22 DIAGNOSIS — I482 Chronic atrial fibrillation, unspecified: Secondary | ICD-10-CM | POA: Diagnosis not present

## 2018-01-22 DIAGNOSIS — L97519 Non-pressure chronic ulcer of other part of right foot with unspecified severity: Secondary | ICD-10-CM | POA: Diagnosis not present

## 2018-01-22 DIAGNOSIS — R600 Localized edema: Secondary | ICD-10-CM | POA: Diagnosis not present

## 2018-01-22 DIAGNOSIS — L97509 Non-pressure chronic ulcer of other part of unspecified foot with unspecified severity: Secondary | ICD-10-CM | POA: Diagnosis not present

## 2018-01-22 DIAGNOSIS — R41841 Cognitive communication deficit: Secondary | ICD-10-CM | POA: Diagnosis not present

## 2018-01-22 DIAGNOSIS — L02611 Cutaneous abscess of right foot: Secondary | ICD-10-CM | POA: Diagnosis not present

## 2018-01-22 DIAGNOSIS — E11628 Type 2 diabetes mellitus with other skin complications: Secondary | ICD-10-CM | POA: Diagnosis not present

## 2018-01-22 DIAGNOSIS — R278 Other lack of coordination: Secondary | ICD-10-CM | POA: Diagnosis not present

## 2018-01-22 DIAGNOSIS — R2681 Unsteadiness on feet: Secondary | ICD-10-CM | POA: Diagnosis not present

## 2018-01-22 DIAGNOSIS — N184 Chronic kidney disease, stage 4 (severe): Secondary | ICD-10-CM | POA: Diagnosis not present

## 2018-01-22 DIAGNOSIS — E1122 Type 2 diabetes mellitus with diabetic chronic kidney disease: Secondary | ICD-10-CM | POA: Diagnosis not present

## 2018-01-22 DIAGNOSIS — I739 Peripheral vascular disease, unspecified: Secondary | ICD-10-CM | POA: Diagnosis not present

## 2018-01-22 DIAGNOSIS — M869 Osteomyelitis, unspecified: Secondary | ICD-10-CM | POA: Diagnosis not present

## 2018-01-22 DIAGNOSIS — I5032 Chronic diastolic (congestive) heart failure: Secondary | ICD-10-CM | POA: Diagnosis not present

## 2018-01-22 DIAGNOSIS — N178 Other acute kidney failure: Secondary | ICD-10-CM | POA: Diagnosis not present

## 2018-01-25 ENCOUNTER — Ambulatory Visit: Payer: Medicare PPO | Admitting: Family Medicine

## 2018-01-26 ENCOUNTER — Telehealth: Payer: Self-pay

## 2018-01-26 DIAGNOSIS — N3000 Acute cystitis without hematuria: Secondary | ICD-10-CM | POA: Diagnosis not present

## 2018-01-26 DIAGNOSIS — M869 Osteomyelitis, unspecified: Secondary | ICD-10-CM | POA: Diagnosis not present

## 2018-01-26 DIAGNOSIS — I2510 Atherosclerotic heart disease of native coronary artery without angina pectoris: Secondary | ICD-10-CM | POA: Diagnosis not present

## 2018-01-26 DIAGNOSIS — I959 Hypotension, unspecified: Secondary | ICD-10-CM | POA: Diagnosis not present

## 2018-01-26 DIAGNOSIS — M069 Rheumatoid arthritis, unspecified: Secondary | ICD-10-CM | POA: Diagnosis not present

## 2018-01-26 DIAGNOSIS — R410 Disorientation, unspecified: Secondary | ICD-10-CM | POA: Diagnosis not present

## 2018-01-26 DIAGNOSIS — M86171 Other acute osteomyelitis, right ankle and foot: Secondary | ICD-10-CM | POA: Diagnosis not present

## 2018-01-26 DIAGNOSIS — M6281 Muscle weakness (generalized): Secondary | ICD-10-CM | POA: Diagnosis not present

## 2018-01-26 DIAGNOSIS — K9420 Gastrostomy complication, unspecified: Secondary | ICD-10-CM | POA: Diagnosis not present

## 2018-01-26 DIAGNOSIS — R41841 Cognitive communication deficit: Secondary | ICD-10-CM | POA: Diagnosis not present

## 2018-01-26 DIAGNOSIS — R2681 Unsteadiness on feet: Secondary | ICD-10-CM | POA: Diagnosis not present

## 2018-01-26 DIAGNOSIS — E119 Type 2 diabetes mellitus without complications: Secondary | ICD-10-CM | POA: Diagnosis not present

## 2018-01-26 DIAGNOSIS — E1122 Type 2 diabetes mellitus with diabetic chronic kidney disease: Secondary | ICD-10-CM | POA: Diagnosis not present

## 2018-01-26 DIAGNOSIS — I509 Heart failure, unspecified: Secondary | ICD-10-CM | POA: Diagnosis not present

## 2018-01-26 DIAGNOSIS — Z7401 Bed confinement status: Secondary | ICD-10-CM | POA: Diagnosis not present

## 2018-01-26 DIAGNOSIS — I4891 Unspecified atrial fibrillation: Secondary | ICD-10-CM | POA: Diagnosis not present

## 2018-01-26 DIAGNOSIS — N184 Chronic kidney disease, stage 4 (severe): Secondary | ICD-10-CM | POA: Diagnosis not present

## 2018-01-26 DIAGNOSIS — I482 Chronic atrial fibrillation, unspecified: Secondary | ICD-10-CM | POA: Diagnosis not present

## 2018-01-26 DIAGNOSIS — R278 Other lack of coordination: Secondary | ICD-10-CM | POA: Diagnosis not present

## 2018-01-26 DIAGNOSIS — I110 Hypertensive heart disease with heart failure: Secondary | ICD-10-CM | POA: Diagnosis not present

## 2018-01-26 DIAGNOSIS — E11621 Type 2 diabetes mellitus with foot ulcer: Secondary | ICD-10-CM | POA: Diagnosis not present

## 2018-01-26 DIAGNOSIS — R404 Transient alteration of awareness: Secondary | ICD-10-CM | POA: Diagnosis not present

## 2018-01-26 DIAGNOSIS — N390 Urinary tract infection, site not specified: Secondary | ICD-10-CM | POA: Diagnosis not present

## 2018-01-26 DIAGNOSIS — F0390 Unspecified dementia without behavioral disturbance: Secondary | ICD-10-CM | POA: Diagnosis not present

## 2018-01-26 DIAGNOSIS — E1143 Type 2 diabetes mellitus with diabetic autonomic (poly)neuropathy: Secondary | ICD-10-CM | POA: Diagnosis not present

## 2018-01-26 DIAGNOSIS — I5032 Chronic diastolic (congestive) heart failure: Secondary | ICD-10-CM | POA: Diagnosis not present

## 2018-01-26 DIAGNOSIS — L089 Local infection of the skin and subcutaneous tissue, unspecified: Secondary | ICD-10-CM | POA: Diagnosis not present

## 2018-01-26 DIAGNOSIS — E785 Hyperlipidemia, unspecified: Secondary | ICD-10-CM | POA: Diagnosis not present

## 2018-01-26 DIAGNOSIS — D631 Anemia in chronic kidney disease: Secondary | ICD-10-CM | POA: Diagnosis not present

## 2018-01-26 DIAGNOSIS — R531 Weakness: Secondary | ICD-10-CM | POA: Diagnosis not present

## 2018-01-26 DIAGNOSIS — R509 Fever, unspecified: Secondary | ICD-10-CM | POA: Diagnosis present

## 2018-01-26 DIAGNOSIS — Z7901 Long term (current) use of anticoagulants: Secondary | ICD-10-CM | POA: Diagnosis not present

## 2018-01-26 DIAGNOSIS — Z79899 Other long term (current) drug therapy: Secondary | ICD-10-CM | POA: Diagnosis not present

## 2018-01-26 DIAGNOSIS — E11628 Type 2 diabetes mellitus with other skin complications: Secondary | ICD-10-CM | POA: Diagnosis not present

## 2018-01-26 DIAGNOSIS — I10 Essential (primary) hypertension: Secondary | ICD-10-CM | POA: Diagnosis not present

## 2018-01-26 DIAGNOSIS — M2550 Pain in unspecified joint: Secondary | ICD-10-CM | POA: Diagnosis not present

## 2018-01-26 DIAGNOSIS — L02611 Cutaneous abscess of right foot: Secondary | ICD-10-CM | POA: Diagnosis not present

## 2018-01-26 DIAGNOSIS — R0902 Hypoxemia: Secondary | ICD-10-CM | POA: Diagnosis not present

## 2018-01-26 NOTE — Telephone Encounter (Signed)
Patient daughter called stated that she canceled her moms appointment for recheck of UTI because a test was done while she was in the hospital they tested her and she was clear of UTI. She also requested that Paxil be put on the patients allergy list because while she was taking it the nurses in the hospital said she was having hallucinations. Once they stopped it she hadn't had anymore. Please advise. Rhonda Cunningham,CMA

## 2018-01-29 ENCOUNTER — Ambulatory Visit: Payer: Medicare PPO | Admitting: Family Medicine

## 2018-01-29 DIAGNOSIS — I5032 Chronic diastolic (congestive) heart failure: Secondary | ICD-10-CM | POA: Diagnosis not present

## 2018-01-29 DIAGNOSIS — E119 Type 2 diabetes mellitus without complications: Secondary | ICD-10-CM | POA: Diagnosis not present

## 2018-01-29 DIAGNOSIS — M86171 Other acute osteomyelitis, right ankle and foot: Secondary | ICD-10-CM | POA: Diagnosis not present

## 2018-01-29 DIAGNOSIS — I10 Essential (primary) hypertension: Secondary | ICD-10-CM | POA: Diagnosis not present

## 2018-01-29 NOTE — Telephone Encounter (Signed)
Medication added to allergy list 

## 2018-01-31 DIAGNOSIS — I10 Essential (primary) hypertension: Secondary | ICD-10-CM | POA: Diagnosis not present

## 2018-01-31 DIAGNOSIS — N184 Chronic kidney disease, stage 4 (severe): Secondary | ICD-10-CM | POA: Diagnosis not present

## 2018-01-31 DIAGNOSIS — L02611 Cutaneous abscess of right foot: Secondary | ICD-10-CM | POA: Diagnosis not present

## 2018-01-31 DIAGNOSIS — E119 Type 2 diabetes mellitus without complications: Secondary | ICD-10-CM | POA: Diagnosis not present

## 2018-01-31 DIAGNOSIS — M069 Rheumatoid arthritis, unspecified: Secondary | ICD-10-CM | POA: Diagnosis not present

## 2018-01-31 DIAGNOSIS — I4891 Unspecified atrial fibrillation: Secondary | ICD-10-CM | POA: Diagnosis not present

## 2018-01-31 DIAGNOSIS — M869 Osteomyelitis, unspecified: Secondary | ICD-10-CM | POA: Diagnosis not present

## 2018-02-06 DIAGNOSIS — I10 Essential (primary) hypertension: Secondary | ICD-10-CM | POA: Diagnosis not present

## 2018-02-06 DIAGNOSIS — M069 Rheumatoid arthritis, unspecified: Secondary | ICD-10-CM | POA: Diagnosis not present

## 2018-02-06 DIAGNOSIS — I5032 Chronic diastolic (congestive) heart failure: Secondary | ICD-10-CM | POA: Diagnosis not present

## 2018-02-06 DIAGNOSIS — M86171 Other acute osteomyelitis, right ankle and foot: Secondary | ICD-10-CM | POA: Diagnosis not present

## 2018-02-11 ENCOUNTER — Emergency Department (HOSPITAL_COMMUNITY)
Admission: EM | Admit: 2018-02-11 | Discharge: 2018-02-12 | Disposition: A | Discharge status: Home / Self Care | Payer: Medicare PPO | Attending: Emergency Medicine | Admitting: Emergency Medicine

## 2018-02-11 ENCOUNTER — Encounter (HOSPITAL_COMMUNITY): Payer: Self-pay | Admitting: Emergency Medicine

## 2018-02-11 DIAGNOSIS — I2510 Atherosclerotic heart disease of native coronary artery without angina pectoris: Secondary | ICD-10-CM | POA: Diagnosis not present

## 2018-02-11 DIAGNOSIS — R509 Fever, unspecified: Secondary | ICD-10-CM | POA: Diagnosis not present

## 2018-02-11 DIAGNOSIS — E785 Hyperlipidemia, unspecified: Secondary | ICD-10-CM | POA: Diagnosis not present

## 2018-02-11 DIAGNOSIS — F0390 Unspecified dementia without behavioral disturbance: Secondary | ICD-10-CM | POA: Insufficient documentation

## 2018-02-11 DIAGNOSIS — Z79899 Other long term (current) drug therapy: Secondary | ICD-10-CM | POA: Insufficient documentation

## 2018-02-11 DIAGNOSIS — N3000 Acute cystitis without hematuria: Secondary | ICD-10-CM | POA: Diagnosis not present

## 2018-02-11 DIAGNOSIS — E119 Type 2 diabetes mellitus without complications: Secondary | ICD-10-CM | POA: Insufficient documentation

## 2018-02-11 DIAGNOSIS — I509 Heart failure, unspecified: Secondary | ICD-10-CM | POA: Diagnosis not present

## 2018-02-11 DIAGNOSIS — I4891 Unspecified atrial fibrillation: Secondary | ICD-10-CM | POA: Diagnosis not present

## 2018-02-11 DIAGNOSIS — Z7901 Long term (current) use of anticoagulants: Secondary | ICD-10-CM | POA: Insufficient documentation

## 2018-02-11 DIAGNOSIS — I110 Hypertensive heart disease with heart failure: Secondary | ICD-10-CM | POA: Insufficient documentation

## 2018-02-11 LAB — I-STAT CG4 LACTIC ACID, ED: Lactic Acid, Venous: 1.22 mmol/L (ref 0.5–1.9)

## 2018-02-11 NOTE — ED Provider Notes (Signed)
TIME SEEN: 11:30 PM  CHIEF COMPLAINT: Fever, cough, possible UTI  HPI: Patient is an 84 year old female with history of dementia, hypertension, hyperlipidemia, diabetes, atrial fibrillation on Eliquis, rheumatoid arthritis on methotrexate who presents to the emergency department with family from [facility] with concerns for possible UTI and pneumonia.  She is currently in a rehab facility as she recently had a right third toe amputation by her podiatrist.  She just finished a course of doxycycline for her foot on November 1 which family reports was to prevent infection.  Today she had a fever of 100.7 and oxygen saturations of 86%.  She has had a foul odor to her urine.  Family reports history of recurrent UTIs.  They states that that she has had weeks of confusion that is unchanged.  She was admitted to the hospital here on October 9 and had negative CT of her head at that time.  No change in mental status since that time.  No new head injury.  Does not wear oxygen chronically.  Was placed on oxygen at her rehab facility.  No vomiting or diarrhea.  Patient complains of pain in bilateral wrists which family states is from her rheumatoid arthritis.  ROS: Level 5 caveat secondary to dementia  PAST MEDICAL HISTORY/PAST SURGICAL HISTORY:  Past Medical History:  Diagnosis Date  . Acute pancreatitis   . Anemia   . Arthritis   . Atrial fibrillation (HCC)   . CAD (coronary artery disease)   . Carotid artery occlusion   . CHF (congestive heart failure) (HCC)   . Diabetes mellitus age 64  . DJD (degenerative joint disease)   . DVT (deep venous thrombosis) (HCC)   . GERD (gastroesophageal reflux disease)   . GI bleed   . Hyperlipidemia   . Hypertension   . Joint pain   . Mesenteric ischemia   . Peripheral vascular disease (HCC)   . Thyroid disease     MEDICATIONS:  Prior to Admission medications   Medication Sig Start Date End Date Taking? Authorizing Provider  AMBULATORY NON  FORMULARY MEDICATION Please check INR weekly for 2 months. Send reports to Dr Denyse Amass fax [phone] 12/19/16   Rodolph Bong, MD  apixaban (ELIQUIS) 5 MG TABS tablet Take 1 tablet (5 mg total) by mouth 2 (two) times daily. 11/21/17   Lewayne Bunting, MD  Calcium Carb-Cholecalciferol 600-800 MG-UNIT TABS TAKE ONE TABLET BY MOUTH 2 TIMES A DAY Patient taking differently: Take 1 tablet by mouth 2 (two) times daily.  09/11/15   Rodolph Bong, MD  clonazePAM (KLONOPIN) 0.5 MG tablet Take 1 tablet (0.5 mg total) by mouth at bedtime. 01/19/18   Jerald Kief, MD  Cranberry 500 MG CAPS Take 500 mg by mouth every morning.    [provider]  diltiazem (CARDIZEM CD) 180 MG 24 hr capsule Take 1 capsule (180 mg total) by mouth at bedtime. 05/24/17   Rodolph Bong, MD  Ergocalciferol (VITAMIN D2) 2000 units TABS Take 2,000 Units by mouth daily.     [provider]  Ferrous Bisglycinate Chelate 15 MG TABS Take 1 tablet by mouth 2 (two) times daily. 10/20/17   Rodolph Bong, MD  folic acid (FOLVITE) 1 MG tablet Take 1 tablet (1 mg total) by mouth daily. 05/24/17   Rodolph Bong, MD  glucose blood test strip Once daily E11.29 03/03/15   Rodolph Bong, MD  levothyroxine (SYNTHROID, LEVOTHROID) 50 MCG tablet Take 1 tablet (50 mcg  total) by mouth daily. 05/24/17   Rodolph Bong, MD  methotrexate (RHEUMATREX) 2.5 MG tablet Take 20 mg by mouth every Friday. Caution:Chemotherapy. Protect from light.    [provider]  metoprolol succinate (TOPROL-XL) 50 MG 24 hr tablet Take 1 tablet (50 mg total) by mouth daily. 05/24/17   Rodolph Bong, MD  PARoxetine (PAXIL) 10 MG tablet Take 1 tablet (10 mg total) by mouth daily. 01/16/18   Rodolph Bong, MD  pravastatin (PRAVACHOL) 40 MG tablet TAKE 1 TABLET (40 MG TOTAL) BY MOUTH DAILY. 01/03/18   Rodolph Bong, MD  PREMARIN vaginal cream PLACE 1 APPLICATORFUL VAGINALLY ONCE A DAY Patient taking differently: Place 1 Applicatorful vaginally daily.  12/08/17    Rodolph Bong, MD  STOOL SOFTENER 100 MG capsule TAKE 1 CAPSULE BY MOUTH 2 TIMES A DAY (AM & PM) Patient taking differently: Take 100 mg by mouth 2 (two) times daily.  09/11/15   Rodolph Bong, MD  torsemide (DEMADEX) 100 MG tablet Take 0.5 tablets (50 mg total) by mouth 2 (two) times daily. 05/24/17   Rodolph Bong, MD  traMADol (ULTRAM) 50 MG tablet Take 1 tablet (50 mg total) by mouth 2 (two) times daily. 01/19/18   Jerald Kief, MD  traZODone (DESYREL) 50 MG tablet Take 0.5-1 tablets (25-50 mg total) by mouth at bedtime as needed for sleep. 12/01/16   Rodolph Bong, MD  vitamin B-12 (CYANOCOBALAMIN) 1000 MCG tablet Take 1,000 mcg by mouth daily.    [provider]    ALLERGIES:  Allergies  Allergen Reactions  . Cefuroxime Anaphylaxis and Other (See Comments)    Throat swelling  . Fish Allergy Shortness Of Breath    Pt said she has throat swelling   . Ciprofloxacin Rash  . Codeine Other (See Comments)    Jittery Jittery  . Sertraline Hcl Rash  . Cephalexin Rash  . Paxil [Paroxetine Hcl] Other (See Comments)    Delirium while in hospital reports of hallucinations.    SOCIAL HISTORY:  Social History   Tobacco Use  . Smoking status: Never Smoker  . Smokeless tobacco: Never Used  Substance Use Topics  . Alcohol use: No    FAMILY HISTORY: Family History  Problem Relation Age of Onset  . Cancer Mother        ovarian  . Other Father        suicide  . Coronary artery disease Sister   . Other Sister        alzheimers  . Heart disease Sister        Before age 58  . Diabetes Sister   . Cancer Daughter        spinal tumor  . Diabetes Daughter   . Hypertension Daughter   . Diabetes Son   . Hyperlipidemia Son   . Hypertension Son     EXAM: BP (!) 121/57 (BP Location: Left Arm)   Pulse 97   Temp 98.5 F (36.9 C) (Oral)   Resp 16   Ht 5\' 2"  (1.575 m)   Wt 66.7 kg   SpO2 100%   BMI 26.89 kg/m  CONSTITUTIONAL: Alert and oriented to person and place but  not year.  She is elderly, chronically ill-appearing.  Afebrile here.  Nontoxic-appearing. HEAD: Normocephalic EYES: Conjunctivae clear, pupils appear equal, EOMI ENT: normal nose; moist mucous membranes NECK: Supple, no meningismus, no nuchal rigidity, no LAD  CARD: Irregularly irregular; S1 and S2 appreciated; no murmurs, no  clicks, no rubs, no gallops RESP: Hypoxic on room air.  Doing well on 2 L nasal cannula.  No tachypnea.  No rhonchi or rales.  Does have scattered wheezes heard intermittently.  No respiratory distress. ABD/GI: Normal bowel sounds; non-distended; soft, non-tender, no rebound, no guarding, no peritoneal signs, no hepatosplenomegaly BACK:  The back appears normal and is non-tender to palpation, there is no CVA tenderness EXT: Patient has had right third toe amputation.  Sutures are intact.  There is no redness, warmth, swelling, drainage, bleeding.  2+ right DP pulse on exam.  Has joint changes consistent with rheumatoid arthritis noted in her hands and wrists with some swelling in both wrists but no redness or warmth.  2+ radial pulses bilaterally.  No edema; normal capillary refill; no cyanosis, no calf tenderness or swelling    SKIN: Normal color for age and race; warm; no rash NEURO: Moves all extremities equally, no pronator drift, no facial asymmetry, normal speech PSYCH: The patient's mood and manner are appropriate. Grooming and personal hygiene are appropriate.  MEDICAL DECISION MAKING: Patient here with fever, hypoxia.  Family is concerned for pneumonia and UTI.  Hemodynamically stable here.  Will obtain labs, chest x-ray, urine.  She has been confused for several weeks.  No acute change.  Recently underwent right third toe amputation.  Site is clean, dry and does not appear infected.    ED PROGRESS: Labs show leukocytosis of 12,000 with left shift.  Mildly elevated creatinine which appears to be her baseline.  Chest x-ray is clear.  Nursing staff attempted to  catheterize patient with no urine output.  Daughter reports diaper was saturated at nursing home prior to arrival.  Will give a liter of IV fluids.  Purewick in place.  We obtained urine after IV fluids.  She does appear to have a urinary tract infection.  We will send urine culture.  She is allergic to cephalosporins and fluoroquinolones.  She frequently grows Klebsiella, Proteus and enterococcus.  Discussed this with pharmacy and they recommend giving a one-time dose of fosfomycin given we are not concerned for urosepsis at this time.  I feel she can be discharged back to her rehab facility.  Family comfortable with this plan.  They have follow-up with urology this week.  She has had some heart rates in the mid 40s to 50s.  She has a documented history of the same.  She is in atrial fibrillation which is chronic.   At this time, I do not feel there is any life-threatening condition present. I have reviewed and discussed all results (EKG, imaging, lab, urine as appropriate) and exam findings with patient/family. I have reviewed nursing notes and appropriate previous records.  I feel the patient is safe to be discharged home without further emergent workup and can continue workup as an outpatient as needed. Discussed usual and customary return precautions. Patient/family verbalize understanding and are comfortable with this plan.  Outpatient follow-up has been provided if needed. All questions have been answered.    EKG Interpretation  Date/Time:  Monday February 12 2018 00:41:48 EST Ventricular Rate:  60 PR Interval:    QRS Duration: 133 QT Interval:  414 QTC Calculation: 414 R Axis:   44 Text Interpretation:  Atrial fibrillation Left bundle branch block No significant change since last tracing Confirmed by Ward, Baxter Hire [phone]) on 02/12/2018 12:55:32 AM         Ward, Layla Maw, DO 02/12/18 [id number]

## 2018-02-11 NOTE — ED Triage Notes (Signed)
Pt transported from [facility] after reports from family of patient not "acting right".  Pt does have hx of recurrent UTI's, recent toe amputation.

## 2018-02-12 ENCOUNTER — Emergency Department (HOSPITAL_COMMUNITY): Payer: Medicare PPO

## 2018-02-12 DIAGNOSIS — N3000 Acute cystitis without hematuria: Secondary | ICD-10-CM | POA: Diagnosis not present

## 2018-02-12 LAB — COMPREHENSIVE METABOLIC PANEL
ALT: 15 U/L (ref 0–44)
ANION GAP: 10 (ref 5–15)
AST: 24 U/L (ref 15–41)
Albumin: 2.7 g/dL — ABNORMAL LOW (ref 3.5–5.0)
Alkaline Phosphatase: 110 U/L (ref 38–126)
BILIRUBIN TOTAL: 1.1 mg/dL (ref 0.3–1.2)
BUN: 35 mg/dL — AB (ref 8–23)
CO2: 28 mmol/L (ref 22–32)
Calcium: 9.8 mg/dL (ref 8.9–10.3)
Chloride: 101 mmol/L (ref 98–111)
Creatinine, Ser: 1.41 mg/dL — ABNORMAL HIGH (ref 0.44–1.00)
GFR calc Af Amer: 38 mL/min — ABNORMAL LOW (ref >60)
GFR, EST NON AFRICAN AMERICAN: 33 mL/min — AB (ref >60)
Glucose, Bld: 167 mg/dL — ABNORMAL HIGH (ref 70–99)
POTASSIUM: 3.5 mmol/L (ref 3.5–5.1)
Sodium: 139 mmol/L (ref 135–145)
TOTAL PROTEIN: 6.8 g/dL (ref 6.5–8.1)

## 2018-02-12 LAB — CBC WITH DIFFERENTIAL/PLATELET
ABS IMMATURE GRANULOCYTES: 0.05 10*3/uL (ref 0.00–0.07)
Basophils Absolute: 0.1 10*3/uL (ref 0.0–0.1)
Basophils Relative: 0 %
Eosinophils Absolute: 0 10*3/uL (ref 0.0–0.5)
Eosinophils Relative: 0 %
HCT: 34.5 % — ABNORMAL LOW (ref 36.0–46.0)
Hemoglobin: 10.9 g/dL — ABNORMAL LOW (ref 12.0–15.0)
Immature Granulocytes: 0 %
Lymphocytes Relative: 23 %
Lymphs Abs: 2.8 10*3/uL (ref 0.7–4.0)
MCH: 31.8 pg (ref 26.0–34.0)
MCHC: 31.6 g/dL (ref 30.0–36.0)
MCV: 100.6 fL — ABNORMAL HIGH (ref 80.0–100.0)
MONO ABS: 0.4 10*3/uL (ref 0.1–1.0)
Monocytes Relative: 3 %
NEUTROS ABS: 8.9 10*3/uL — AB (ref 1.7–7.7)
NEUTROS PCT: 74 %
Platelets: 292 10*3/uL (ref 150–400)
RBC: 3.43 MIL/uL — ABNORMAL LOW (ref 3.87–5.11)
RDW: 17.8 % — AB (ref 11.5–15.5)
WBC: 12.2 10*3/uL — AB (ref 4.0–10.5)
nRBC: 0.2 % (ref 0.0–0.2)

## 2018-02-12 LAB — URINALYSIS, ROUTINE W REFLEX MICROSCOPIC
BILIRUBIN URINE: NEGATIVE
Glucose, UA: NEGATIVE mg/dL
Ketones, ur: NEGATIVE mg/dL
Nitrite: NEGATIVE
PH: 5 (ref 5.0–8.0)
Protein, ur: 100 mg/dL — AB
SPECIFIC GRAVITY, URINE: 1.011 (ref 1.005–1.030)
WBC, UA: >50 WBC/hpf — ABNORMAL HIGH (ref 0–5)

## 2018-02-12 IMAGING — DX DG CHEST 2V
2 series · 2 of 2 positions shown · non-contrast
Comparison: [DATE]

CLINICAL DATA: Fever tonight. History of recurrent urinary tract
infections.

EXAM:
CHEST - 2 VIEW

[chest lat]
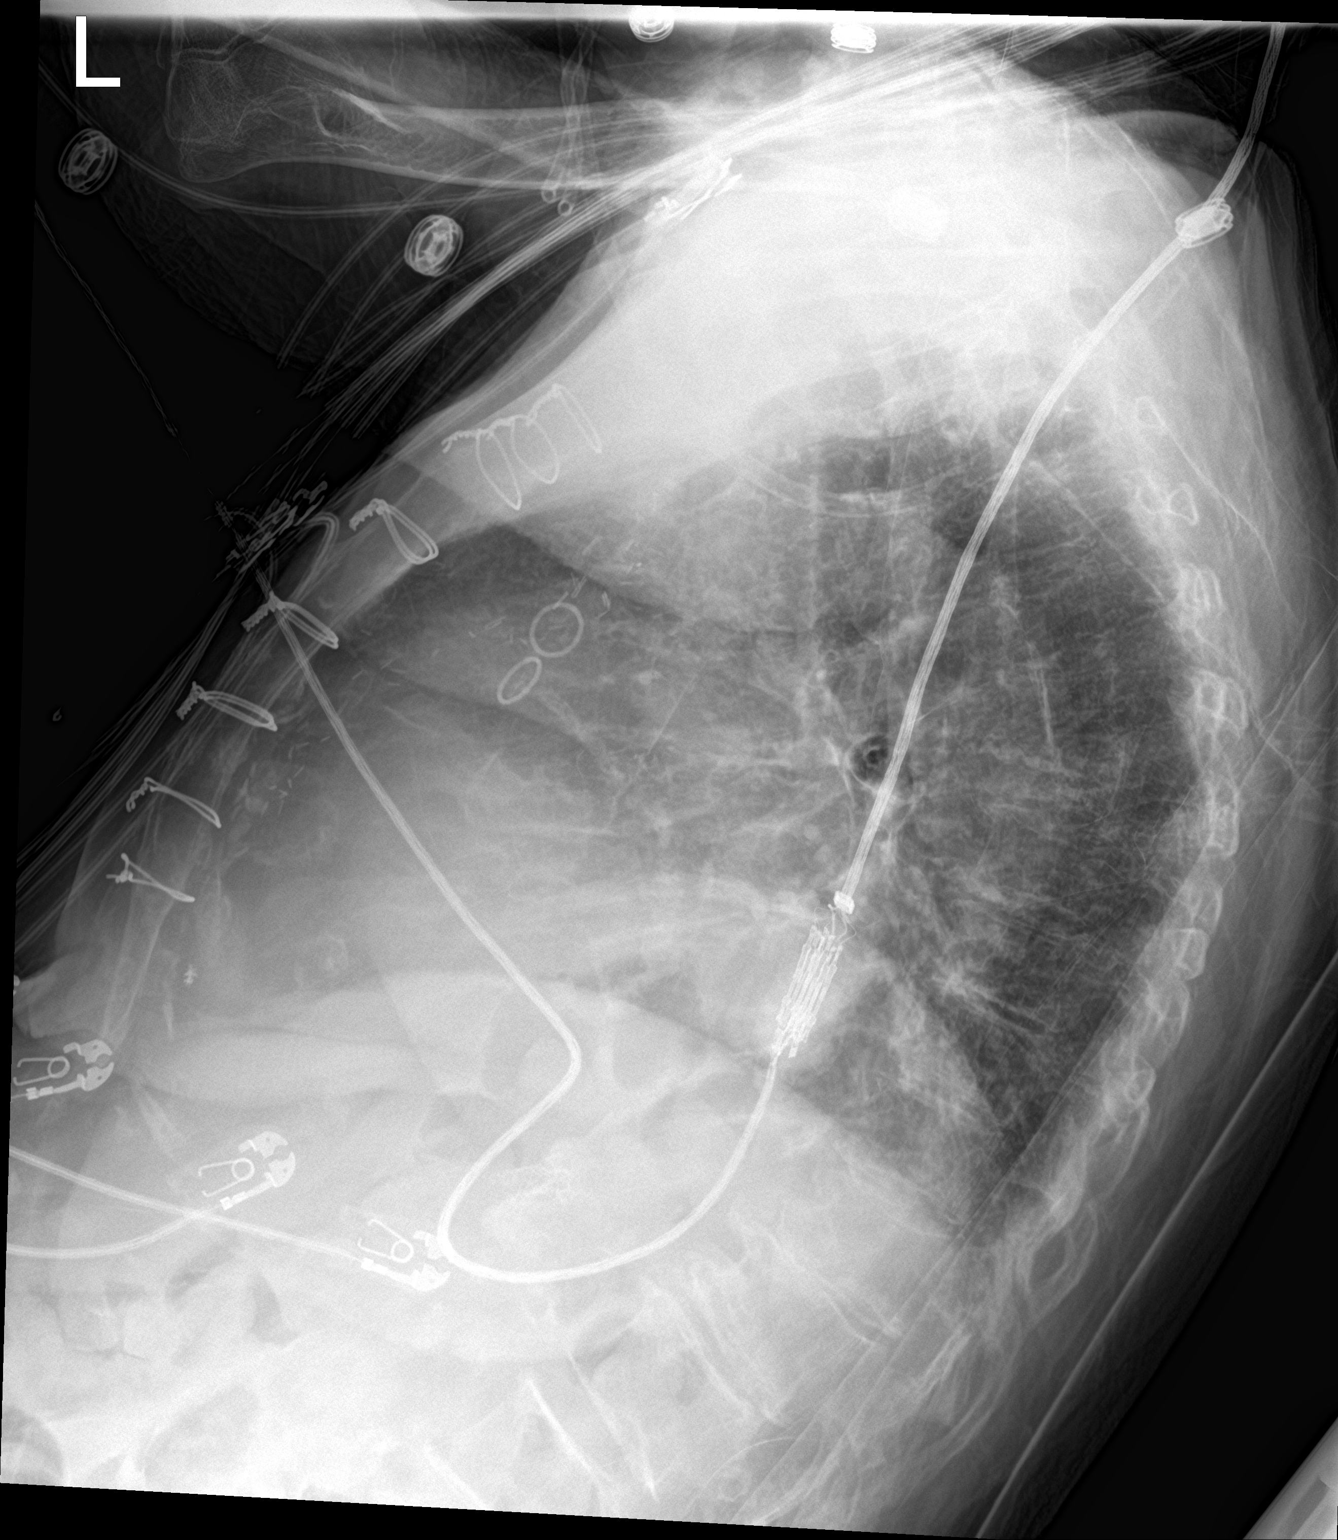

[chest ap]
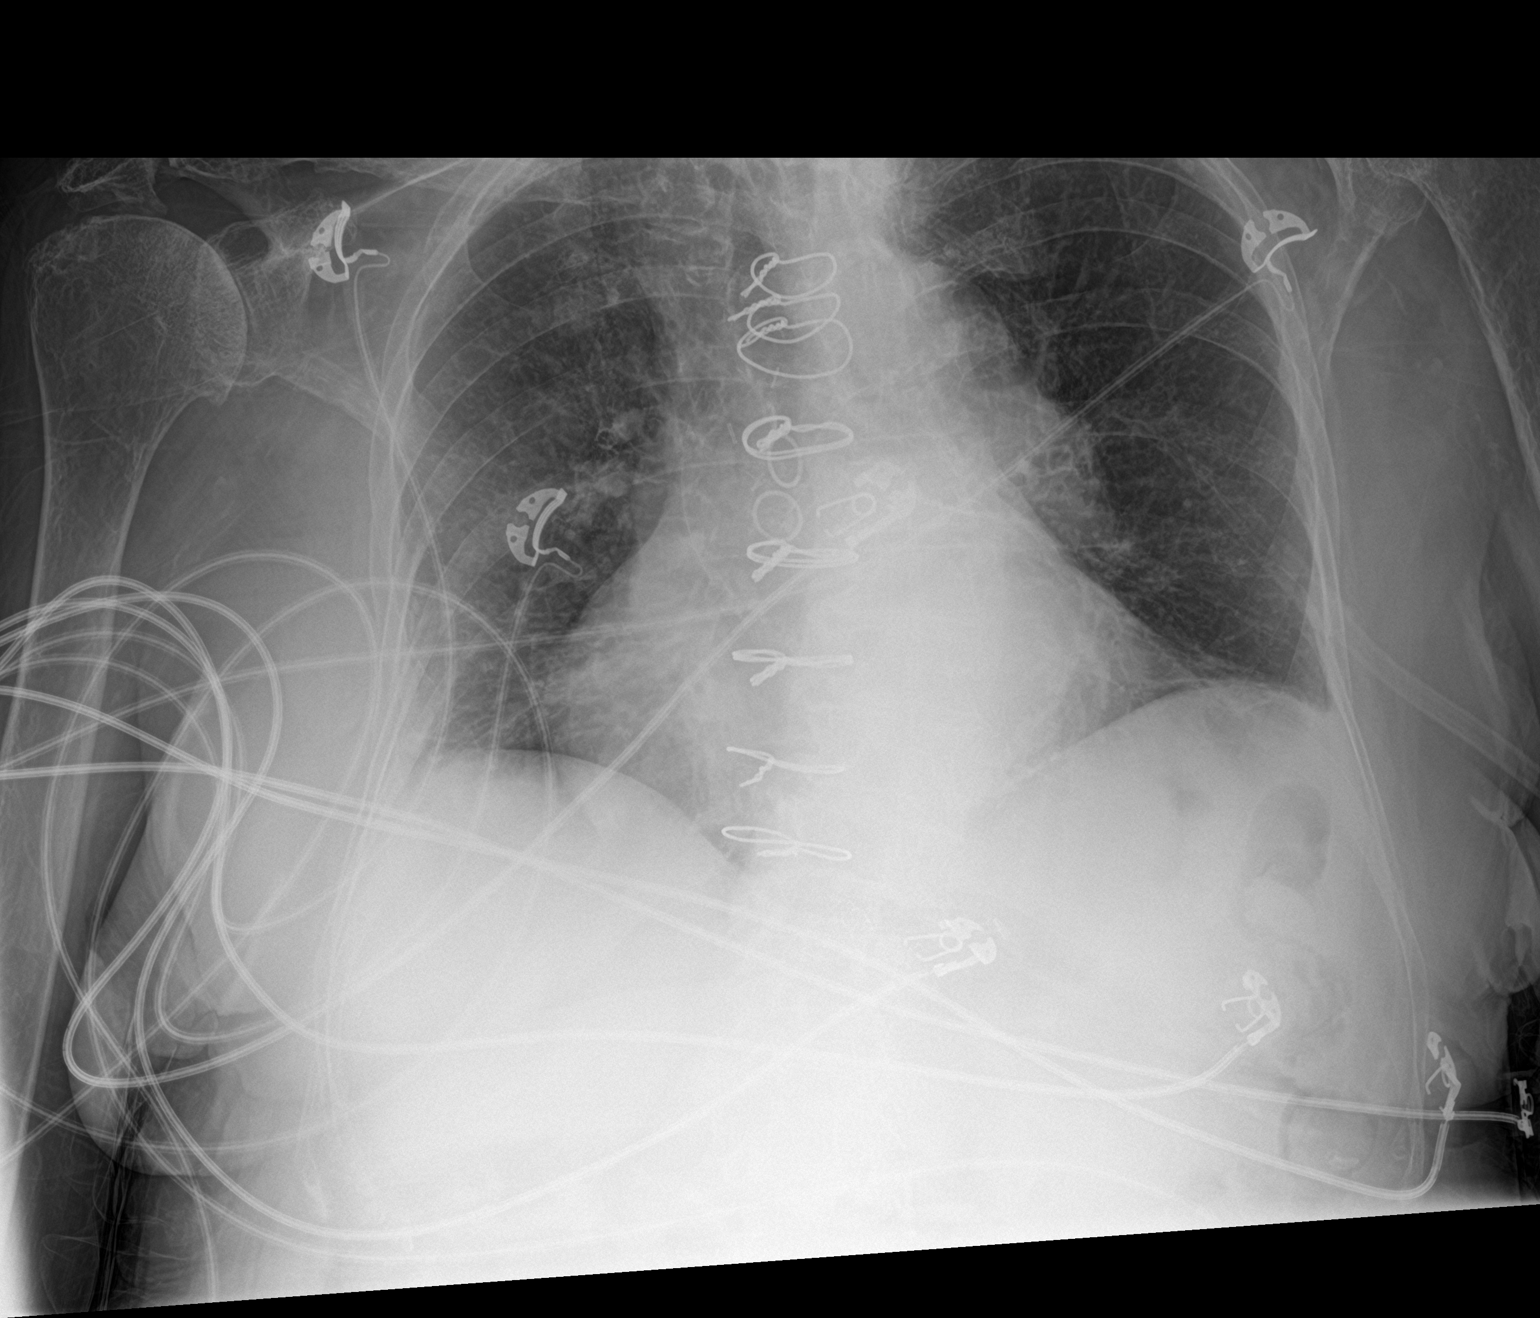

[2 of 2 positions shown; findings below may reference images not displayed]

FINDINGS: Postoperative changes in the mediastinum. Shallow inspiration.
Cardiac enlargement. No vascular congestion, edema, or
consolidation. No blunting of costophrenic angles. No pneumothorax.
Calcified and tortuous aorta. Degenerative changes in the spine and
shoulders.
IMPRESSION: Cardiac enlargement. No evidence of active pulmonary disease.

## 2018-02-12 MED ORDER — ONDANSETRON HCL 4 MG/2ML IJ SOLN
4.0000 mg | Freq: Once | INTRAMUSCULAR | Status: AC
  Administered 2018-02-12: 4 mg via INTRAVENOUS
  Filled 2018-02-12: qty 2

## 2018-02-12 MED ORDER — FOSFOMYCIN TROMETHAMINE 3 G PO PACK
3.0000 g | PACK | Freq: Once | ORAL | Status: AC
  Administered 2018-02-12: 3 g via ORAL
  Filled 2018-02-12: qty 3

## 2018-02-12 MED ORDER — SODIUM CHLORIDE 0.9 % IV BOLUS (SEPSIS)
1000.0000 mL | Freq: Once | INTRAVENOUS | Status: AC
  Administered 2018-02-12: 1000 mL via INTRAVENOUS

## 2018-02-12 NOTE — ED Notes (Addendum)
In/out cath done, no urine return noted. Pure wik placed, family at bedside.

## 2018-02-12 NOTE — ED Notes (Signed)
Pt in radiology at this time. 

## 2018-02-12 NOTE — ED Notes (Signed)
[facility] called for transport back to [facility]

## 2018-02-12 NOTE — ED Notes (Signed)
[facility] bedside 

## 2018-02-12 NOTE — ED Notes (Signed)
Additional attempts made to call report to [facility], no answer.

## 2018-02-12 NOTE — ED Notes (Signed)
Pt returned from xray via stretcher. Family at bedside

## 2018-02-12 NOTE — ED Notes (Signed)
NS bolus completed, pt has no urine in suction container and has a dry depends under her.  Bladder scan 469m, another attempt made with small in/out kit unsuccessful.  51F in/out cath kit used, 400+ml urine obtained, sample and culture sent to lab.  Family at bedside

## 2018-02-12 NOTE — ED Notes (Signed)
Multiple attempts made to call report to [facility], no answer

## 2018-02-13 DIAGNOSIS — I5032 Chronic diastolic (congestive) heart failure: Secondary | ICD-10-CM | POA: Diagnosis not present

## 2018-02-13 DIAGNOSIS — M86171 Other acute osteomyelitis, right ankle and foot: Secondary | ICD-10-CM | POA: Diagnosis not present

## 2018-02-13 DIAGNOSIS — M069 Rheumatoid arthritis, unspecified: Secondary | ICD-10-CM | POA: Diagnosis not present

## 2018-02-13 DIAGNOSIS — N3000 Acute cystitis without hematuria: Secondary | ICD-10-CM | POA: Diagnosis not present

## 2018-02-14 DIAGNOSIS — E1143 Type 2 diabetes mellitus with diabetic autonomic (poly)neuropathy: Secondary | ICD-10-CM | POA: Diagnosis not present

## 2018-02-14 DIAGNOSIS — N390 Urinary tract infection, site not specified: Secondary | ICD-10-CM | POA: Diagnosis not present

## 2018-02-14 LAB — URINE CULTURE

## 2018-02-15 ENCOUNTER — Telehealth: Payer: Self-pay | Admitting: *Deleted

## 2018-02-15 NOTE — Telephone Encounter (Signed)
Post ED Visit - Positive Culture Follow-up  Culture report reviewed by antimicrobial stewardship pharmacist:  []  , Pharm.D. []  Enzo Bi, Pharm.D., BCPS AQ-ID []  , Pharm.D., BCPS []  Celedonio Miyamoto, .D., BCPS []  [place], .D., BCPS, AAHIVP []  Georgina Pillion, Pharm.D., BCPS, AAHIVP []  [address], PharmD, BCPS []  , PharmD, BCPS []  [place], PharmD, BCPS []  [address], PharmD J. Tessin, PharmD  Positive urine culture Treated with Fosfomycin in the ED, organism sensitive to the same and no further patient follow-up is required at this time.  [facility] 02/15/2018, 9:19 AM

## 2018-02-19 DIAGNOSIS — I5032 Chronic diastolic (congestive) heart failure: Secondary | ICD-10-CM | POA: Diagnosis not present

## 2018-02-19 DIAGNOSIS — N3000 Acute cystitis without hematuria: Secondary | ICD-10-CM | POA: Diagnosis not present

## 2018-02-19 DIAGNOSIS — I10 Essential (primary) hypertension: Secondary | ICD-10-CM | POA: Diagnosis not present

## 2018-02-19 DIAGNOSIS — M86171 Other acute osteomyelitis, right ankle and foot: Secondary | ICD-10-CM | POA: Diagnosis not present

## 2018-02-20 DIAGNOSIS — M6281 Muscle weakness (generalized): Secondary | ICD-10-CM | POA: Diagnosis not present

## 2018-02-20 DIAGNOSIS — E11621 Type 2 diabetes mellitus with foot ulcer: Secondary | ICD-10-CM | POA: Diagnosis not present

## 2018-02-20 DIAGNOSIS — E1122 Type 2 diabetes mellitus with diabetic chronic kidney disease: Secondary | ICD-10-CM | POA: Diagnosis not present

## 2018-02-20 DIAGNOSIS — I4891 Unspecified atrial fibrillation: Secondary | ICD-10-CM | POA: Diagnosis not present

## 2018-02-20 DIAGNOSIS — I10 Essential (primary) hypertension: Secondary | ICD-10-CM | POA: Diagnosis not present

## 2018-02-20 DIAGNOSIS — R41841 Cognitive communication deficit: Secondary | ICD-10-CM | POA: Diagnosis not present

## 2018-02-20 DIAGNOSIS — R278 Other lack of coordination: Secondary | ICD-10-CM | POA: Diagnosis not present

## 2018-02-20 DIAGNOSIS — D631 Anemia in chronic kidney disease: Secondary | ICD-10-CM | POA: Diagnosis not present

## 2018-02-20 DIAGNOSIS — R2681 Unsteadiness on feet: Secondary | ICD-10-CM | POA: Diagnosis not present

## 2018-02-21 DIAGNOSIS — R278 Other lack of coordination: Secondary | ICD-10-CM | POA: Diagnosis not present

## 2018-02-21 DIAGNOSIS — L8990 Pressure ulcer of unspecified site, unspecified stage: Secondary | ICD-10-CM | POA: Diagnosis not present

## 2018-02-21 DIAGNOSIS — R41841 Cognitive communication deficit: Secondary | ICD-10-CM | POA: Diagnosis not present

## 2018-02-21 DIAGNOSIS — I4891 Unspecified atrial fibrillation: Secondary | ICD-10-CM | POA: Diagnosis not present

## 2018-02-21 DIAGNOSIS — E11621 Type 2 diabetes mellitus with foot ulcer: Secondary | ICD-10-CM | POA: Diagnosis not present

## 2018-02-21 DIAGNOSIS — D631 Anemia in chronic kidney disease: Secondary | ICD-10-CM | POA: Diagnosis not present

## 2018-02-21 DIAGNOSIS — I10 Essential (primary) hypertension: Secondary | ICD-10-CM | POA: Diagnosis not present

## 2018-02-21 DIAGNOSIS — R2681 Unsteadiness on feet: Secondary | ICD-10-CM | POA: Diagnosis not present

## 2018-02-21 DIAGNOSIS — E1122 Type 2 diabetes mellitus with diabetic chronic kidney disease: Secondary | ICD-10-CM | POA: Diagnosis not present

## 2018-02-21 DIAGNOSIS — M6281 Muscle weakness (generalized): Secondary | ICD-10-CM | POA: Diagnosis not present

## 2018-02-23 DIAGNOSIS — N390 Urinary tract infection, site not specified: Secondary | ICD-10-CM | POA: Diagnosis not present

## 2018-02-23 DIAGNOSIS — R2681 Unsteadiness on feet: Secondary | ICD-10-CM | POA: Diagnosis not present

## 2018-02-23 DIAGNOSIS — R278 Other lack of coordination: Secondary | ICD-10-CM | POA: Diagnosis not present

## 2018-02-23 DIAGNOSIS — I4891 Unspecified atrial fibrillation: Secondary | ICD-10-CM | POA: Diagnosis not present

## 2018-02-23 DIAGNOSIS — R41841 Cognitive communication deficit: Secondary | ICD-10-CM | POA: Diagnosis not present

## 2018-02-23 DIAGNOSIS — I10 Essential (primary) hypertension: Secondary | ICD-10-CM | POA: Diagnosis not present

## 2018-02-23 DIAGNOSIS — E1122 Type 2 diabetes mellitus with diabetic chronic kidney disease: Secondary | ICD-10-CM | POA: Diagnosis not present

## 2018-02-23 DIAGNOSIS — E11621 Type 2 diabetes mellitus with foot ulcer: Secondary | ICD-10-CM | POA: Diagnosis not present

## 2018-02-23 DIAGNOSIS — D631 Anemia in chronic kidney disease: Secondary | ICD-10-CM | POA: Diagnosis not present

## 2018-02-23 DIAGNOSIS — M6281 Muscle weakness (generalized): Secondary | ICD-10-CM | POA: Diagnosis not present

## 2018-02-24 DIAGNOSIS — D631 Anemia in chronic kidney disease: Secondary | ICD-10-CM | POA: Diagnosis not present

## 2018-02-24 DIAGNOSIS — E11621 Type 2 diabetes mellitus with foot ulcer: Secondary | ICD-10-CM | POA: Diagnosis not present

## 2018-02-24 DIAGNOSIS — R2681 Unsteadiness on feet: Secondary | ICD-10-CM | POA: Diagnosis not present

## 2018-02-24 DIAGNOSIS — R278 Other lack of coordination: Secondary | ICD-10-CM | POA: Diagnosis not present

## 2018-02-24 DIAGNOSIS — M6281 Muscle weakness (generalized): Secondary | ICD-10-CM | POA: Diagnosis not present

## 2018-02-24 DIAGNOSIS — I4891 Unspecified atrial fibrillation: Secondary | ICD-10-CM | POA: Diagnosis not present

## 2018-02-24 DIAGNOSIS — R41841 Cognitive communication deficit: Secondary | ICD-10-CM | POA: Diagnosis not present

## 2018-02-24 DIAGNOSIS — I10 Essential (primary) hypertension: Secondary | ICD-10-CM | POA: Diagnosis not present

## 2018-02-24 DIAGNOSIS — E1122 Type 2 diabetes mellitus with diabetic chronic kidney disease: Secondary | ICD-10-CM | POA: Diagnosis not present

## 2018-02-25 DIAGNOSIS — R2681 Unsteadiness on feet: Secondary | ICD-10-CM | POA: Diagnosis not present

## 2018-02-25 DIAGNOSIS — I4891 Unspecified atrial fibrillation: Secondary | ICD-10-CM | POA: Diagnosis not present

## 2018-02-25 DIAGNOSIS — E1122 Type 2 diabetes mellitus with diabetic chronic kidney disease: Secondary | ICD-10-CM | POA: Diagnosis not present

## 2018-02-25 DIAGNOSIS — D631 Anemia in chronic kidney disease: Secondary | ICD-10-CM | POA: Diagnosis not present

## 2018-02-25 DIAGNOSIS — I10 Essential (primary) hypertension: Secondary | ICD-10-CM | POA: Diagnosis not present

## 2018-02-25 DIAGNOSIS — M6281 Muscle weakness (generalized): Secondary | ICD-10-CM | POA: Diagnosis not present

## 2018-02-25 DIAGNOSIS — E11621 Type 2 diabetes mellitus with foot ulcer: Secondary | ICD-10-CM | POA: Diagnosis not present

## 2018-02-25 DIAGNOSIS — R41841 Cognitive communication deficit: Secondary | ICD-10-CM | POA: Diagnosis not present

## 2018-02-25 DIAGNOSIS — R278 Other lack of coordination: Secondary | ICD-10-CM | POA: Diagnosis not present

## 2018-02-26 DIAGNOSIS — I4891 Unspecified atrial fibrillation: Secondary | ICD-10-CM | POA: Diagnosis not present

## 2018-02-26 DIAGNOSIS — D631 Anemia in chronic kidney disease: Secondary | ICD-10-CM | POA: Diagnosis not present

## 2018-02-26 DIAGNOSIS — R278 Other lack of coordination: Secondary | ICD-10-CM | POA: Diagnosis not present

## 2018-02-26 DIAGNOSIS — I10 Essential (primary) hypertension: Secondary | ICD-10-CM | POA: Diagnosis not present

## 2018-02-26 DIAGNOSIS — M6281 Muscle weakness (generalized): Secondary | ICD-10-CM | POA: Diagnosis not present

## 2018-02-26 DIAGNOSIS — R2681 Unsteadiness on feet: Secondary | ICD-10-CM | POA: Diagnosis not present

## 2018-02-26 DIAGNOSIS — R41841 Cognitive communication deficit: Secondary | ICD-10-CM | POA: Diagnosis not present

## 2018-02-26 DIAGNOSIS — E11621 Type 2 diabetes mellitus with foot ulcer: Secondary | ICD-10-CM | POA: Diagnosis not present

## 2018-02-26 DIAGNOSIS — E1122 Type 2 diabetes mellitus with diabetic chronic kidney disease: Secondary | ICD-10-CM | POA: Diagnosis not present

## 2018-02-27 DIAGNOSIS — D631 Anemia in chronic kidney disease: Secondary | ICD-10-CM | POA: Diagnosis not present

## 2018-02-27 DIAGNOSIS — I4891 Unspecified atrial fibrillation: Secondary | ICD-10-CM | POA: Diagnosis not present

## 2018-02-27 DIAGNOSIS — R41841 Cognitive communication deficit: Secondary | ICD-10-CM | POA: Diagnosis not present

## 2018-02-27 DIAGNOSIS — M6281 Muscle weakness (generalized): Secondary | ICD-10-CM | POA: Diagnosis not present

## 2018-02-27 DIAGNOSIS — R2681 Unsteadiness on feet: Secondary | ICD-10-CM | POA: Diagnosis not present

## 2018-02-27 DIAGNOSIS — R278 Other lack of coordination: Secondary | ICD-10-CM | POA: Diagnosis not present

## 2018-02-27 DIAGNOSIS — E11621 Type 2 diabetes mellitus with foot ulcer: Secondary | ICD-10-CM | POA: Diagnosis not present

## 2018-02-27 DIAGNOSIS — E1122 Type 2 diabetes mellitus with diabetic chronic kidney disease: Secondary | ICD-10-CM | POA: Diagnosis not present

## 2018-02-27 DIAGNOSIS — I10 Essential (primary) hypertension: Secondary | ICD-10-CM | POA: Diagnosis not present

## 2018-02-28 DIAGNOSIS — E1122 Type 2 diabetes mellitus with diabetic chronic kidney disease: Secondary | ICD-10-CM | POA: Diagnosis not present

## 2018-02-28 DIAGNOSIS — I4891 Unspecified atrial fibrillation: Secondary | ICD-10-CM | POA: Diagnosis not present

## 2018-02-28 DIAGNOSIS — R41841 Cognitive communication deficit: Secondary | ICD-10-CM | POA: Diagnosis not present

## 2018-02-28 DIAGNOSIS — E11621 Type 2 diabetes mellitus with foot ulcer: Secondary | ICD-10-CM | POA: Diagnosis not present

## 2018-02-28 DIAGNOSIS — I10 Essential (primary) hypertension: Secondary | ICD-10-CM | POA: Diagnosis not present

## 2018-02-28 DIAGNOSIS — R278 Other lack of coordination: Secondary | ICD-10-CM | POA: Diagnosis not present

## 2018-02-28 DIAGNOSIS — R2681 Unsteadiness on feet: Secondary | ICD-10-CM | POA: Diagnosis not present

## 2018-02-28 DIAGNOSIS — L8990 Pressure ulcer of unspecified site, unspecified stage: Secondary | ICD-10-CM | POA: Diagnosis not present

## 2018-02-28 DIAGNOSIS — I5032 Chronic diastolic (congestive) heart failure: Secondary | ICD-10-CM | POA: Diagnosis not present

## 2018-02-28 DIAGNOSIS — D631 Anemia in chronic kidney disease: Secondary | ICD-10-CM | POA: Diagnosis not present

## 2018-02-28 DIAGNOSIS — M86171 Other acute osteomyelitis, right ankle and foot: Secondary | ICD-10-CM | POA: Diagnosis not present

## 2018-02-28 DIAGNOSIS — N3000 Acute cystitis without hematuria: Secondary | ICD-10-CM | POA: Diagnosis not present

## 2018-02-28 DIAGNOSIS — E119 Type 2 diabetes mellitus without complications: Secondary | ICD-10-CM | POA: Diagnosis not present

## 2018-02-28 DIAGNOSIS — M6281 Muscle weakness (generalized): Secondary | ICD-10-CM | POA: Diagnosis not present

## 2018-03-01 DIAGNOSIS — I4891 Unspecified atrial fibrillation: Secondary | ICD-10-CM | POA: Diagnosis not present

## 2018-03-01 DIAGNOSIS — M6281 Muscle weakness (generalized): Secondary | ICD-10-CM | POA: Diagnosis not present

## 2018-03-01 DIAGNOSIS — D631 Anemia in chronic kidney disease: Secondary | ICD-10-CM | POA: Diagnosis not present

## 2018-03-01 DIAGNOSIS — R278 Other lack of coordination: Secondary | ICD-10-CM | POA: Diagnosis not present

## 2018-03-01 DIAGNOSIS — E1122 Type 2 diabetes mellitus with diabetic chronic kidney disease: Secondary | ICD-10-CM | POA: Diagnosis not present

## 2018-03-01 DIAGNOSIS — R2681 Unsteadiness on feet: Secondary | ICD-10-CM | POA: Diagnosis not present

## 2018-03-01 DIAGNOSIS — R41841 Cognitive communication deficit: Secondary | ICD-10-CM | POA: Diagnosis not present

## 2018-03-01 DIAGNOSIS — E11621 Type 2 diabetes mellitus with foot ulcer: Secondary | ICD-10-CM | POA: Diagnosis not present

## 2018-03-01 DIAGNOSIS — I10 Essential (primary) hypertension: Secondary | ICD-10-CM | POA: Diagnosis not present

## 2018-03-02 DIAGNOSIS — R2681 Unsteadiness on feet: Secondary | ICD-10-CM | POA: Diagnosis not present

## 2018-03-02 DIAGNOSIS — R41841 Cognitive communication deficit: Secondary | ICD-10-CM | POA: Diagnosis not present

## 2018-03-02 DIAGNOSIS — R278 Other lack of coordination: Secondary | ICD-10-CM | POA: Diagnosis not present

## 2018-03-02 DIAGNOSIS — D631 Anemia in chronic kidney disease: Secondary | ICD-10-CM | POA: Diagnosis not present

## 2018-03-02 DIAGNOSIS — I4891 Unspecified atrial fibrillation: Secondary | ICD-10-CM | POA: Diagnosis not present

## 2018-03-02 DIAGNOSIS — M6281 Muscle weakness (generalized): Secondary | ICD-10-CM | POA: Diagnosis not present

## 2018-03-02 DIAGNOSIS — I10 Essential (primary) hypertension: Secondary | ICD-10-CM | POA: Diagnosis not present

## 2018-03-02 DIAGNOSIS — E11621 Type 2 diabetes mellitus with foot ulcer: Secondary | ICD-10-CM | POA: Diagnosis not present

## 2018-03-02 DIAGNOSIS — E1122 Type 2 diabetes mellitus with diabetic chronic kidney disease: Secondary | ICD-10-CM | POA: Diagnosis not present

## 2018-03-05 DIAGNOSIS — R278 Other lack of coordination: Secondary | ICD-10-CM | POA: Diagnosis not present

## 2018-03-05 DIAGNOSIS — D631 Anemia in chronic kidney disease: Secondary | ICD-10-CM | POA: Diagnosis not present

## 2018-03-05 DIAGNOSIS — E1122 Type 2 diabetes mellitus with diabetic chronic kidney disease: Secondary | ICD-10-CM | POA: Diagnosis not present

## 2018-03-05 DIAGNOSIS — R41841 Cognitive communication deficit: Secondary | ICD-10-CM | POA: Diagnosis not present

## 2018-03-05 DIAGNOSIS — M6281 Muscle weakness (generalized): Secondary | ICD-10-CM | POA: Diagnosis not present

## 2018-03-05 DIAGNOSIS — I4891 Unspecified atrial fibrillation: Secondary | ICD-10-CM | POA: Diagnosis not present

## 2018-03-05 DIAGNOSIS — R2681 Unsteadiness on feet: Secondary | ICD-10-CM | POA: Diagnosis not present

## 2018-03-05 DIAGNOSIS — I10 Essential (primary) hypertension: Secondary | ICD-10-CM | POA: Diagnosis not present

## 2018-03-05 DIAGNOSIS — E11621 Type 2 diabetes mellitus with foot ulcer: Secondary | ICD-10-CM | POA: Diagnosis not present

## 2018-03-06 DIAGNOSIS — I4891 Unspecified atrial fibrillation: Secondary | ICD-10-CM | POA: Diagnosis not present

## 2018-03-06 DIAGNOSIS — E11621 Type 2 diabetes mellitus with foot ulcer: Secondary | ICD-10-CM | POA: Diagnosis not present

## 2018-03-06 DIAGNOSIS — R41841 Cognitive communication deficit: Secondary | ICD-10-CM | POA: Diagnosis not present

## 2018-03-06 DIAGNOSIS — E1122 Type 2 diabetes mellitus with diabetic chronic kidney disease: Secondary | ICD-10-CM | POA: Diagnosis not present

## 2018-03-06 DIAGNOSIS — R2681 Unsteadiness on feet: Secondary | ICD-10-CM | POA: Diagnosis not present

## 2018-03-06 DIAGNOSIS — M6281 Muscle weakness (generalized): Secondary | ICD-10-CM | POA: Diagnosis not present

## 2018-03-06 DIAGNOSIS — R278 Other lack of coordination: Secondary | ICD-10-CM | POA: Diagnosis not present

## 2018-03-06 DIAGNOSIS — I10 Essential (primary) hypertension: Secondary | ICD-10-CM | POA: Diagnosis not present

## 2018-03-06 DIAGNOSIS — D631 Anemia in chronic kidney disease: Secondary | ICD-10-CM | POA: Diagnosis not present

## 2018-03-07 DIAGNOSIS — I482 Chronic atrial fibrillation, unspecified: Secondary | ICD-10-CM | POA: Diagnosis not present

## 2018-03-07 DIAGNOSIS — I5032 Chronic diastolic (congestive) heart failure: Secondary | ICD-10-CM | POA: Diagnosis not present

## 2018-03-07 DIAGNOSIS — I10 Essential (primary) hypertension: Secondary | ICD-10-CM | POA: Diagnosis not present

## 2018-03-07 DIAGNOSIS — D631 Anemia in chronic kidney disease: Secondary | ICD-10-CM | POA: Diagnosis not present

## 2018-03-07 DIAGNOSIS — E1122 Type 2 diabetes mellitus with diabetic chronic kidney disease: Secondary | ICD-10-CM | POA: Diagnosis not present

## 2018-03-07 DIAGNOSIS — I4891 Unspecified atrial fibrillation: Secondary | ICD-10-CM | POA: Diagnosis not present

## 2018-03-07 DIAGNOSIS — L8990 Pressure ulcer of unspecified site, unspecified stage: Secondary | ICD-10-CM | POA: Diagnosis not present

## 2018-03-07 DIAGNOSIS — N3020 Other chronic cystitis without hematuria: Secondary | ICD-10-CM | POA: Diagnosis not present

## 2018-03-07 DIAGNOSIS — M86171 Other acute osteomyelitis, right ankle and foot: Secondary | ICD-10-CM | POA: Diagnosis not present

## 2018-03-07 DIAGNOSIS — M6281 Muscle weakness (generalized): Secondary | ICD-10-CM | POA: Diagnosis not present

## 2018-03-07 DIAGNOSIS — R2681 Unsteadiness on feet: Secondary | ICD-10-CM | POA: Diagnosis not present

## 2018-03-07 DIAGNOSIS — R278 Other lack of coordination: Secondary | ICD-10-CM | POA: Diagnosis not present

## 2018-03-07 DIAGNOSIS — E11621 Type 2 diabetes mellitus with foot ulcer: Secondary | ICD-10-CM | POA: Diagnosis not present

## 2018-03-07 DIAGNOSIS — R41841 Cognitive communication deficit: Secondary | ICD-10-CM | POA: Diagnosis not present

## 2018-03-08 DIAGNOSIS — R278 Other lack of coordination: Secondary | ICD-10-CM | POA: Diagnosis not present

## 2018-03-08 DIAGNOSIS — R2681 Unsteadiness on feet: Secondary | ICD-10-CM | POA: Diagnosis not present

## 2018-03-08 DIAGNOSIS — I10 Essential (primary) hypertension: Secondary | ICD-10-CM | POA: Diagnosis not present

## 2018-03-08 DIAGNOSIS — M6281 Muscle weakness (generalized): Secondary | ICD-10-CM | POA: Diagnosis not present

## 2018-03-08 DIAGNOSIS — I4891 Unspecified atrial fibrillation: Secondary | ICD-10-CM | POA: Diagnosis not present

## 2018-03-08 DIAGNOSIS — D631 Anemia in chronic kidney disease: Secondary | ICD-10-CM | POA: Diagnosis not present

## 2018-03-08 DIAGNOSIS — E11621 Type 2 diabetes mellitus with foot ulcer: Secondary | ICD-10-CM | POA: Diagnosis not present

## 2018-03-08 DIAGNOSIS — R41841 Cognitive communication deficit: Secondary | ICD-10-CM | POA: Diagnosis not present

## 2018-03-08 DIAGNOSIS — E1122 Type 2 diabetes mellitus with diabetic chronic kidney disease: Secondary | ICD-10-CM | POA: Diagnosis not present

## 2018-03-11 DIAGNOSIS — E1122 Type 2 diabetes mellitus with diabetic chronic kidney disease: Secondary | ICD-10-CM | POA: Diagnosis not present

## 2018-03-11 DIAGNOSIS — I4891 Unspecified atrial fibrillation: Secondary | ICD-10-CM | POA: Diagnosis not present

## 2018-03-11 DIAGNOSIS — I10 Essential (primary) hypertension: Secondary | ICD-10-CM | POA: Diagnosis not present

## 2018-03-11 DIAGNOSIS — R278 Other lack of coordination: Secondary | ICD-10-CM | POA: Diagnosis not present

## 2018-03-11 DIAGNOSIS — R2681 Unsteadiness on feet: Secondary | ICD-10-CM | POA: Diagnosis not present

## 2018-03-11 DIAGNOSIS — E11621 Type 2 diabetes mellitus with foot ulcer: Secondary | ICD-10-CM | POA: Diagnosis not present

## 2018-03-11 DIAGNOSIS — D631 Anemia in chronic kidney disease: Secondary | ICD-10-CM | POA: Diagnosis not present

## 2018-03-11 DIAGNOSIS — R41841 Cognitive communication deficit: Secondary | ICD-10-CM | POA: Diagnosis not present

## 2018-03-11 DIAGNOSIS — M6281 Muscle weakness (generalized): Secondary | ICD-10-CM | POA: Diagnosis not present

## 2018-03-12 DIAGNOSIS — I10 Essential (primary) hypertension: Secondary | ICD-10-CM | POA: Diagnosis not present

## 2018-03-12 DIAGNOSIS — E11621 Type 2 diabetes mellitus with foot ulcer: Secondary | ICD-10-CM | POA: Diagnosis not present

## 2018-03-12 DIAGNOSIS — M6281 Muscle weakness (generalized): Secondary | ICD-10-CM | POA: Diagnosis not present

## 2018-03-12 DIAGNOSIS — D631 Anemia in chronic kidney disease: Secondary | ICD-10-CM | POA: Diagnosis not present

## 2018-03-12 DIAGNOSIS — R2681 Unsteadiness on feet: Secondary | ICD-10-CM | POA: Diagnosis not present

## 2018-03-12 DIAGNOSIS — I4891 Unspecified atrial fibrillation: Secondary | ICD-10-CM | POA: Diagnosis not present

## 2018-03-12 DIAGNOSIS — R278 Other lack of coordination: Secondary | ICD-10-CM | POA: Diagnosis not present

## 2018-03-12 DIAGNOSIS — E1122 Type 2 diabetes mellitus with diabetic chronic kidney disease: Secondary | ICD-10-CM | POA: Diagnosis not present

## 2018-03-12 DIAGNOSIS — R41841 Cognitive communication deficit: Secondary | ICD-10-CM | POA: Diagnosis not present

## 2018-03-13 DIAGNOSIS — D631 Anemia in chronic kidney disease: Secondary | ICD-10-CM | POA: Diagnosis not present

## 2018-03-13 DIAGNOSIS — E1122 Type 2 diabetes mellitus with diabetic chronic kidney disease: Secondary | ICD-10-CM | POA: Diagnosis not present

## 2018-03-13 DIAGNOSIS — R41841 Cognitive communication deficit: Secondary | ICD-10-CM | POA: Diagnosis not present

## 2018-03-13 DIAGNOSIS — M6281 Muscle weakness (generalized): Secondary | ICD-10-CM | POA: Diagnosis not present

## 2018-03-13 DIAGNOSIS — I4891 Unspecified atrial fibrillation: Secondary | ICD-10-CM | POA: Diagnosis not present

## 2018-03-13 DIAGNOSIS — I10 Essential (primary) hypertension: Secondary | ICD-10-CM | POA: Diagnosis not present

## 2018-03-13 DIAGNOSIS — E11621 Type 2 diabetes mellitus with foot ulcer: Secondary | ICD-10-CM | POA: Diagnosis not present

## 2018-03-13 DIAGNOSIS — R2681 Unsteadiness on feet: Secondary | ICD-10-CM | POA: Diagnosis not present

## 2018-03-13 DIAGNOSIS — R278 Other lack of coordination: Secondary | ICD-10-CM | POA: Diagnosis not present

## 2018-03-14 DIAGNOSIS — I4891 Unspecified atrial fibrillation: Secondary | ICD-10-CM | POA: Diagnosis not present

## 2018-03-14 DIAGNOSIS — D631 Anemia in chronic kidney disease: Secondary | ICD-10-CM | POA: Diagnosis not present

## 2018-03-14 DIAGNOSIS — E1122 Type 2 diabetes mellitus with diabetic chronic kidney disease: Secondary | ICD-10-CM | POA: Diagnosis not present

## 2018-03-14 DIAGNOSIS — N3020 Other chronic cystitis without hematuria: Secondary | ICD-10-CM | POA: Diagnosis not present

## 2018-03-14 DIAGNOSIS — M6281 Muscle weakness (generalized): Secondary | ICD-10-CM | POA: Diagnosis not present

## 2018-03-14 DIAGNOSIS — I10 Essential (primary) hypertension: Secondary | ICD-10-CM | POA: Diagnosis not present

## 2018-03-14 DIAGNOSIS — L8992 Pressure ulcer of unspecified site, stage 2: Secondary | ICD-10-CM | POA: Diagnosis not present

## 2018-03-14 DIAGNOSIS — R278 Other lack of coordination: Secondary | ICD-10-CM | POA: Diagnosis not present

## 2018-03-14 DIAGNOSIS — R2681 Unsteadiness on feet: Secondary | ICD-10-CM | POA: Diagnosis not present

## 2018-03-14 DIAGNOSIS — E119 Type 2 diabetes mellitus without complications: Secondary | ICD-10-CM | POA: Diagnosis not present

## 2018-03-14 DIAGNOSIS — M86171 Other acute osteomyelitis, right ankle and foot: Secondary | ICD-10-CM | POA: Diagnosis not present

## 2018-03-14 DIAGNOSIS — R41841 Cognitive communication deficit: Secondary | ICD-10-CM | POA: Diagnosis not present

## 2018-03-14 DIAGNOSIS — R634 Abnormal weight loss: Secondary | ICD-10-CM | POA: Diagnosis not present

## 2018-03-14 DIAGNOSIS — E11621 Type 2 diabetes mellitus with foot ulcer: Secondary | ICD-10-CM | POA: Diagnosis not present

## 2018-03-15 DIAGNOSIS — D631 Anemia in chronic kidney disease: Secondary | ICD-10-CM | POA: Diagnosis not present

## 2018-03-15 DIAGNOSIS — I10 Essential (primary) hypertension: Secondary | ICD-10-CM | POA: Diagnosis not present

## 2018-03-15 DIAGNOSIS — R41841 Cognitive communication deficit: Secondary | ICD-10-CM | POA: Diagnosis not present

## 2018-03-15 DIAGNOSIS — I4891 Unspecified atrial fibrillation: Secondary | ICD-10-CM | POA: Diagnosis not present

## 2018-03-15 DIAGNOSIS — R278 Other lack of coordination: Secondary | ICD-10-CM | POA: Diagnosis not present

## 2018-03-15 DIAGNOSIS — R2681 Unsteadiness on feet: Secondary | ICD-10-CM | POA: Diagnosis not present

## 2018-03-15 DIAGNOSIS — M6281 Muscle weakness (generalized): Secondary | ICD-10-CM | POA: Diagnosis not present

## 2018-03-15 DIAGNOSIS — E1122 Type 2 diabetes mellitus with diabetic chronic kidney disease: Secondary | ICD-10-CM | POA: Diagnosis not present

## 2018-03-15 DIAGNOSIS — E11621 Type 2 diabetes mellitus with foot ulcer: Secondary | ICD-10-CM | POA: Diagnosis not present

## 2018-03-16 DIAGNOSIS — N390 Urinary tract infection, site not specified: Secondary | ICD-10-CM | POA: Diagnosis not present

## 2018-03-16 DIAGNOSIS — D631 Anemia in chronic kidney disease: Secondary | ICD-10-CM | POA: Diagnosis not present

## 2018-03-16 DIAGNOSIS — R41841 Cognitive communication deficit: Secondary | ICD-10-CM | POA: Diagnosis not present

## 2018-03-16 DIAGNOSIS — R2681 Unsteadiness on feet: Secondary | ICD-10-CM | POA: Diagnosis not present

## 2018-03-16 DIAGNOSIS — M6281 Muscle weakness (generalized): Secondary | ICD-10-CM | POA: Diagnosis not present

## 2018-03-16 DIAGNOSIS — I4891 Unspecified atrial fibrillation: Secondary | ICD-10-CM | POA: Diagnosis not present

## 2018-03-16 DIAGNOSIS — I10 Essential (primary) hypertension: Secondary | ICD-10-CM | POA: Diagnosis not present

## 2018-03-16 DIAGNOSIS — E11621 Type 2 diabetes mellitus with foot ulcer: Secondary | ICD-10-CM | POA: Diagnosis not present

## 2018-03-16 DIAGNOSIS — R278 Other lack of coordination: Secondary | ICD-10-CM | POA: Diagnosis not present

## 2018-03-16 DIAGNOSIS — E1122 Type 2 diabetes mellitus with diabetic chronic kidney disease: Secondary | ICD-10-CM | POA: Diagnosis not present

## 2018-03-19 DIAGNOSIS — I10 Essential (primary) hypertension: Secondary | ICD-10-CM | POA: Diagnosis not present

## 2018-03-19 DIAGNOSIS — D631 Anemia in chronic kidney disease: Secondary | ICD-10-CM | POA: Diagnosis not present

## 2018-03-19 DIAGNOSIS — R41841 Cognitive communication deficit: Secondary | ICD-10-CM | POA: Diagnosis not present

## 2018-03-19 DIAGNOSIS — R2681 Unsteadiness on feet: Secondary | ICD-10-CM | POA: Diagnosis not present

## 2018-03-19 DIAGNOSIS — E1122 Type 2 diabetes mellitus with diabetic chronic kidney disease: Secondary | ICD-10-CM | POA: Diagnosis not present

## 2018-03-19 DIAGNOSIS — I4891 Unspecified atrial fibrillation: Secondary | ICD-10-CM | POA: Diagnosis not present

## 2018-03-19 DIAGNOSIS — M6281 Muscle weakness (generalized): Secondary | ICD-10-CM | POA: Diagnosis not present

## 2018-03-19 DIAGNOSIS — E11621 Type 2 diabetes mellitus with foot ulcer: Secondary | ICD-10-CM | POA: Diagnosis not present

## 2018-03-19 DIAGNOSIS — R278 Other lack of coordination: Secondary | ICD-10-CM | POA: Diagnosis not present

## 2018-03-20 DIAGNOSIS — E11621 Type 2 diabetes mellitus with foot ulcer: Secondary | ICD-10-CM | POA: Diagnosis not present

## 2018-03-20 DIAGNOSIS — I10 Essential (primary) hypertension: Secondary | ICD-10-CM | POA: Diagnosis not present

## 2018-03-20 DIAGNOSIS — I4891 Unspecified atrial fibrillation: Secondary | ICD-10-CM | POA: Diagnosis not present

## 2018-03-20 DIAGNOSIS — R278 Other lack of coordination: Secondary | ICD-10-CM | POA: Diagnosis not present

## 2018-03-20 DIAGNOSIS — D631 Anemia in chronic kidney disease: Secondary | ICD-10-CM | POA: Diagnosis not present

## 2018-03-20 DIAGNOSIS — E1122 Type 2 diabetes mellitus with diabetic chronic kidney disease: Secondary | ICD-10-CM | POA: Diagnosis not present

## 2018-03-20 DIAGNOSIS — M6281 Muscle weakness (generalized): Secondary | ICD-10-CM | POA: Diagnosis not present

## 2018-03-20 DIAGNOSIS — R41841 Cognitive communication deficit: Secondary | ICD-10-CM | POA: Diagnosis not present

## 2018-03-20 DIAGNOSIS — R2681 Unsteadiness on feet: Secondary | ICD-10-CM | POA: Diagnosis not present

## 2018-03-21 DIAGNOSIS — E1122 Type 2 diabetes mellitus with diabetic chronic kidney disease: Secondary | ICD-10-CM | POA: Diagnosis not present

## 2018-03-21 DIAGNOSIS — D631 Anemia in chronic kidney disease: Secondary | ICD-10-CM | POA: Diagnosis not present

## 2018-03-21 DIAGNOSIS — L8992 Pressure ulcer of unspecified site, stage 2: Secondary | ICD-10-CM | POA: Diagnosis not present

## 2018-03-21 DIAGNOSIS — R2681 Unsteadiness on feet: Secondary | ICD-10-CM | POA: Diagnosis not present

## 2018-03-21 DIAGNOSIS — M6281 Muscle weakness (generalized): Secondary | ICD-10-CM | POA: Diagnosis not present

## 2018-03-21 DIAGNOSIS — I4891 Unspecified atrial fibrillation: Secondary | ICD-10-CM | POA: Diagnosis not present

## 2018-03-21 DIAGNOSIS — E11621 Type 2 diabetes mellitus with foot ulcer: Secondary | ICD-10-CM | POA: Diagnosis not present

## 2018-03-21 DIAGNOSIS — R41841 Cognitive communication deficit: Secondary | ICD-10-CM | POA: Diagnosis not present

## 2018-03-21 DIAGNOSIS — R278 Other lack of coordination: Secondary | ICD-10-CM | POA: Diagnosis not present

## 2018-03-21 DIAGNOSIS — I10 Essential (primary) hypertension: Secondary | ICD-10-CM | POA: Diagnosis not present

## 2018-03-22 DIAGNOSIS — R278 Other lack of coordination: Secondary | ICD-10-CM | POA: Diagnosis not present

## 2018-03-22 DIAGNOSIS — D631 Anemia in chronic kidney disease: Secondary | ICD-10-CM | POA: Diagnosis not present

## 2018-03-22 DIAGNOSIS — N3020 Other chronic cystitis without hematuria: Secondary | ICD-10-CM | POA: Diagnosis not present

## 2018-03-22 DIAGNOSIS — M86171 Other acute osteomyelitis, right ankle and foot: Secondary | ICD-10-CM | POA: Diagnosis not present

## 2018-03-22 DIAGNOSIS — E11621 Type 2 diabetes mellitus with foot ulcer: Secondary | ICD-10-CM | POA: Diagnosis not present

## 2018-03-22 DIAGNOSIS — M6281 Muscle weakness (generalized): Secondary | ICD-10-CM | POA: Diagnosis not present

## 2018-03-22 DIAGNOSIS — I5032 Chronic diastolic (congestive) heart failure: Secondary | ICD-10-CM | POA: Diagnosis not present

## 2018-03-22 DIAGNOSIS — I482 Chronic atrial fibrillation, unspecified: Secondary | ICD-10-CM | POA: Diagnosis not present

## 2018-03-22 DIAGNOSIS — I10 Essential (primary) hypertension: Secondary | ICD-10-CM | POA: Diagnosis not present

## 2018-03-22 DIAGNOSIS — I4891 Unspecified atrial fibrillation: Secondary | ICD-10-CM | POA: Diagnosis not present

## 2018-03-22 DIAGNOSIS — E1122 Type 2 diabetes mellitus with diabetic chronic kidney disease: Secondary | ICD-10-CM | POA: Diagnosis not present

## 2018-03-22 DIAGNOSIS — R2681 Unsteadiness on feet: Secondary | ICD-10-CM | POA: Diagnosis not present

## 2018-03-22 DIAGNOSIS — R41841 Cognitive communication deficit: Secondary | ICD-10-CM | POA: Diagnosis not present

## 2018-03-23 DIAGNOSIS — E1122 Type 2 diabetes mellitus with diabetic chronic kidney disease: Secondary | ICD-10-CM | POA: Diagnosis not present

## 2018-03-23 DIAGNOSIS — R2681 Unsteadiness on feet: Secondary | ICD-10-CM | POA: Diagnosis not present

## 2018-03-23 DIAGNOSIS — R41841 Cognitive communication deficit: Secondary | ICD-10-CM | POA: Diagnosis not present

## 2018-03-23 DIAGNOSIS — D631 Anemia in chronic kidney disease: Secondary | ICD-10-CM | POA: Diagnosis not present

## 2018-03-23 DIAGNOSIS — E11621 Type 2 diabetes mellitus with foot ulcer: Secondary | ICD-10-CM | POA: Diagnosis not present

## 2018-03-23 DIAGNOSIS — M6281 Muscle weakness (generalized): Secondary | ICD-10-CM | POA: Diagnosis not present

## 2018-03-23 DIAGNOSIS — I10 Essential (primary) hypertension: Secondary | ICD-10-CM | POA: Diagnosis not present

## 2018-03-23 DIAGNOSIS — R278 Other lack of coordination: Secondary | ICD-10-CM | POA: Diagnosis not present

## 2018-03-23 DIAGNOSIS — I4891 Unspecified atrial fibrillation: Secondary | ICD-10-CM | POA: Diagnosis not present

## 2018-03-26 DIAGNOSIS — D631 Anemia in chronic kidney disease: Secondary | ICD-10-CM | POA: Diagnosis not present

## 2018-03-26 DIAGNOSIS — R278 Other lack of coordination: Secondary | ICD-10-CM | POA: Diagnosis not present

## 2018-03-26 DIAGNOSIS — I10 Essential (primary) hypertension: Secondary | ICD-10-CM | POA: Diagnosis not present

## 2018-03-26 DIAGNOSIS — E11621 Type 2 diabetes mellitus with foot ulcer: Secondary | ICD-10-CM | POA: Diagnosis not present

## 2018-03-26 DIAGNOSIS — R2681 Unsteadiness on feet: Secondary | ICD-10-CM | POA: Diagnosis not present

## 2018-03-26 DIAGNOSIS — R41841 Cognitive communication deficit: Secondary | ICD-10-CM | POA: Diagnosis not present

## 2018-03-26 DIAGNOSIS — M6281 Muscle weakness (generalized): Secondary | ICD-10-CM | POA: Diagnosis not present

## 2018-03-26 DIAGNOSIS — E1122 Type 2 diabetes mellitus with diabetic chronic kidney disease: Secondary | ICD-10-CM | POA: Diagnosis not present

## 2018-03-26 DIAGNOSIS — I4891 Unspecified atrial fibrillation: Secondary | ICD-10-CM | POA: Diagnosis not present

## 2018-03-27 DIAGNOSIS — D631 Anemia in chronic kidney disease: Secondary | ICD-10-CM | POA: Diagnosis not present

## 2018-03-27 DIAGNOSIS — R278 Other lack of coordination: Secondary | ICD-10-CM | POA: Diagnosis not present

## 2018-03-27 DIAGNOSIS — I10 Essential (primary) hypertension: Secondary | ICD-10-CM | POA: Diagnosis not present

## 2018-03-27 DIAGNOSIS — R2681 Unsteadiness on feet: Secondary | ICD-10-CM | POA: Diagnosis not present

## 2018-03-27 DIAGNOSIS — E11621 Type 2 diabetes mellitus with foot ulcer: Secondary | ICD-10-CM | POA: Diagnosis not present

## 2018-03-27 DIAGNOSIS — M6281 Muscle weakness (generalized): Secondary | ICD-10-CM | POA: Diagnosis not present

## 2018-03-27 DIAGNOSIS — R41841 Cognitive communication deficit: Secondary | ICD-10-CM | POA: Diagnosis not present

## 2018-03-27 DIAGNOSIS — I4891 Unspecified atrial fibrillation: Secondary | ICD-10-CM | POA: Diagnosis not present

## 2018-03-27 DIAGNOSIS — E1122 Type 2 diabetes mellitus with diabetic chronic kidney disease: Secondary | ICD-10-CM | POA: Diagnosis not present

## 2018-03-28 DIAGNOSIS — I4891 Unspecified atrial fibrillation: Secondary | ICD-10-CM | POA: Diagnosis not present

## 2018-03-28 DIAGNOSIS — I10 Essential (primary) hypertension: Secondary | ICD-10-CM | POA: Diagnosis not present

## 2018-03-28 DIAGNOSIS — F411 Generalized anxiety disorder: Secondary | ICD-10-CM | POA: Diagnosis not present

## 2018-03-28 DIAGNOSIS — M069 Rheumatoid arthritis, unspecified: Secondary | ICD-10-CM | POA: Diagnosis not present

## 2018-03-28 DIAGNOSIS — S51012A Laceration without foreign body of left elbow, initial encounter: Secondary | ICD-10-CM | POA: Diagnosis not present

## 2018-03-28 DIAGNOSIS — E1122 Type 2 diabetes mellitus with diabetic chronic kidney disease: Secondary | ICD-10-CM | POA: Diagnosis not present

## 2018-03-28 DIAGNOSIS — R2681 Unsteadiness on feet: Secondary | ICD-10-CM | POA: Diagnosis not present

## 2018-03-28 DIAGNOSIS — E11621 Type 2 diabetes mellitus with foot ulcer: Secondary | ICD-10-CM | POA: Diagnosis not present

## 2018-03-28 DIAGNOSIS — M6281 Muscle weakness (generalized): Secondary | ICD-10-CM | POA: Diagnosis not present

## 2018-03-28 DIAGNOSIS — D631 Anemia in chronic kidney disease: Secondary | ICD-10-CM | POA: Diagnosis not present

## 2018-03-28 DIAGNOSIS — R278 Other lack of coordination: Secondary | ICD-10-CM | POA: Diagnosis not present

## 2018-03-28 DIAGNOSIS — R41841 Cognitive communication deficit: Secondary | ICD-10-CM | POA: Diagnosis not present

## 2018-03-28 DIAGNOSIS — L8992 Pressure ulcer of unspecified site, stage 2: Secondary | ICD-10-CM | POA: Diagnosis not present

## 2018-03-28 DIAGNOSIS — N183 Chronic kidney disease, stage 3 (moderate): Secondary | ICD-10-CM | POA: Diagnosis not present

## 2018-03-29 DIAGNOSIS — I4891 Unspecified atrial fibrillation: Secondary | ICD-10-CM | POA: Diagnosis not present

## 2018-03-29 DIAGNOSIS — R278 Other lack of coordination: Secondary | ICD-10-CM | POA: Diagnosis not present

## 2018-03-29 DIAGNOSIS — I5032 Chronic diastolic (congestive) heart failure: Secondary | ICD-10-CM | POA: Diagnosis not present

## 2018-03-29 DIAGNOSIS — M86171 Other acute osteomyelitis, right ankle and foot: Secondary | ICD-10-CM | POA: Diagnosis not present

## 2018-03-29 DIAGNOSIS — D631 Anemia in chronic kidney disease: Secondary | ICD-10-CM | POA: Diagnosis not present

## 2018-03-29 DIAGNOSIS — M069 Rheumatoid arthritis, unspecified: Secondary | ICD-10-CM | POA: Diagnosis not present

## 2018-03-29 DIAGNOSIS — I482 Chronic atrial fibrillation, unspecified: Secondary | ICD-10-CM | POA: Diagnosis not present

## 2018-03-29 DIAGNOSIS — I10 Essential (primary) hypertension: Secondary | ICD-10-CM | POA: Diagnosis not present

## 2018-03-29 DIAGNOSIS — E11621 Type 2 diabetes mellitus with foot ulcer: Secondary | ICD-10-CM | POA: Diagnosis not present

## 2018-03-29 DIAGNOSIS — M6281 Muscle weakness (generalized): Secondary | ICD-10-CM | POA: Diagnosis not present

## 2018-03-29 DIAGNOSIS — E1122 Type 2 diabetes mellitus with diabetic chronic kidney disease: Secondary | ICD-10-CM | POA: Diagnosis not present

## 2018-03-29 DIAGNOSIS — R41841 Cognitive communication deficit: Secondary | ICD-10-CM | POA: Diagnosis not present

## 2018-03-29 DIAGNOSIS — R2681 Unsteadiness on feet: Secondary | ICD-10-CM | POA: Diagnosis not present

## 2018-03-30 DIAGNOSIS — E11621 Type 2 diabetes mellitus with foot ulcer: Secondary | ICD-10-CM | POA: Diagnosis not present

## 2018-03-30 DIAGNOSIS — I4891 Unspecified atrial fibrillation: Secondary | ICD-10-CM | POA: Diagnosis not present

## 2018-03-30 DIAGNOSIS — R278 Other lack of coordination: Secondary | ICD-10-CM | POA: Diagnosis not present

## 2018-03-30 DIAGNOSIS — M6281 Muscle weakness (generalized): Secondary | ICD-10-CM | POA: Diagnosis not present

## 2018-03-30 DIAGNOSIS — E1122 Type 2 diabetes mellitus with diabetic chronic kidney disease: Secondary | ICD-10-CM | POA: Diagnosis not present

## 2018-03-30 DIAGNOSIS — D631 Anemia in chronic kidney disease: Secondary | ICD-10-CM | POA: Diagnosis not present

## 2018-03-30 DIAGNOSIS — R2681 Unsteadiness on feet: Secondary | ICD-10-CM | POA: Diagnosis not present

## 2018-03-30 DIAGNOSIS — R41841 Cognitive communication deficit: Secondary | ICD-10-CM | POA: Diagnosis not present

## 2018-03-30 DIAGNOSIS — I10 Essential (primary) hypertension: Secondary | ICD-10-CM | POA: Diagnosis not present

## 2018-04-02 DIAGNOSIS — M6281 Muscle weakness (generalized): Secondary | ICD-10-CM | POA: Diagnosis not present

## 2018-04-02 DIAGNOSIS — R278 Other lack of coordination: Secondary | ICD-10-CM | POA: Diagnosis not present

## 2018-04-02 DIAGNOSIS — R41841 Cognitive communication deficit: Secondary | ICD-10-CM | POA: Diagnosis not present

## 2018-04-02 DIAGNOSIS — R2681 Unsteadiness on feet: Secondary | ICD-10-CM | POA: Diagnosis not present

## 2018-04-02 DIAGNOSIS — E1122 Type 2 diabetes mellitus with diabetic chronic kidney disease: Secondary | ICD-10-CM | POA: Diagnosis not present

## 2018-04-02 DIAGNOSIS — E11621 Type 2 diabetes mellitus with foot ulcer: Secondary | ICD-10-CM | POA: Diagnosis not present

## 2018-04-02 DIAGNOSIS — I10 Essential (primary) hypertension: Secondary | ICD-10-CM | POA: Diagnosis not present

## 2018-04-02 DIAGNOSIS — I4891 Unspecified atrial fibrillation: Secondary | ICD-10-CM | POA: Diagnosis not present

## 2018-04-02 DIAGNOSIS — D631 Anemia in chronic kidney disease: Secondary | ICD-10-CM | POA: Diagnosis not present

## 2018-04-05 DIAGNOSIS — L8992 Pressure ulcer of unspecified site, stage 2: Secondary | ICD-10-CM | POA: Diagnosis not present

## 2018-04-12 DIAGNOSIS — I482 Chronic atrial fibrillation, unspecified: Secondary | ICD-10-CM | POA: Diagnosis not present

## 2018-04-12 DIAGNOSIS — N3020 Other chronic cystitis without hematuria: Secondary | ICD-10-CM | POA: Diagnosis not present

## 2018-04-12 DIAGNOSIS — M86171 Other acute osteomyelitis, right ankle and foot: Secondary | ICD-10-CM | POA: Diagnosis not present

## 2018-04-12 DIAGNOSIS — I5032 Chronic diastolic (congestive) heart failure: Secondary | ICD-10-CM | POA: Diagnosis not present

## 2018-04-12 DIAGNOSIS — L8992 Pressure ulcer of unspecified site, stage 2: Secondary | ICD-10-CM | POA: Diagnosis not present

## 2018-04-18 DIAGNOSIS — L8992 Pressure ulcer of unspecified site, stage 2: Secondary | ICD-10-CM | POA: Diagnosis not present

## 2018-04-25 DIAGNOSIS — L8992 Pressure ulcer of unspecified site, stage 2: Secondary | ICD-10-CM | POA: Diagnosis not present

## 2018-04-26 DIAGNOSIS — Z79899 Other long term (current) drug therapy: Secondary | ICD-10-CM | POA: Diagnosis not present

## 2018-04-30 DIAGNOSIS — E119 Type 2 diabetes mellitus without complications: Secondary | ICD-10-CM | POA: Diagnosis not present

## 2018-04-30 DIAGNOSIS — I5032 Chronic diastolic (congestive) heart failure: Secondary | ICD-10-CM | POA: Diagnosis not present

## 2018-04-30 DIAGNOSIS — I10 Essential (primary) hypertension: Secondary | ICD-10-CM | POA: Diagnosis not present

## 2018-04-30 DIAGNOSIS — M069 Rheumatoid arthritis, unspecified: Secondary | ICD-10-CM | POA: Diagnosis not present

## 2018-05-02 DIAGNOSIS — L89892 Pressure ulcer of other site, stage 2: Secondary | ICD-10-CM | POA: Diagnosis not present

## 2018-05-03 DIAGNOSIS — E1142 Type 2 diabetes mellitus with diabetic polyneuropathy: Secondary | ICD-10-CM | POA: Diagnosis not present

## 2018-05-03 DIAGNOSIS — Z89411 Acquired absence of right great toe: Secondary | ICD-10-CM | POA: Diagnosis not present

## 2018-05-03 DIAGNOSIS — L859 Epidermal thickening, unspecified: Secondary | ICD-10-CM | POA: Diagnosis not present

## 2018-05-03 DIAGNOSIS — L602 Onychogryphosis: Secondary | ICD-10-CM | POA: Diagnosis not present

## 2018-05-08 DIAGNOSIS — D631 Anemia in chronic kidney disease: Secondary | ICD-10-CM | POA: Diagnosis not present

## 2018-05-08 DIAGNOSIS — E1122 Type 2 diabetes mellitus with diabetic chronic kidney disease: Secondary | ICD-10-CM | POA: Diagnosis not present

## 2018-05-08 DIAGNOSIS — E11621 Type 2 diabetes mellitus with foot ulcer: Secondary | ICD-10-CM | POA: Diagnosis not present

## 2018-05-08 DIAGNOSIS — R2681 Unsteadiness on feet: Secondary | ICD-10-CM | POA: Diagnosis not present

## 2018-05-08 DIAGNOSIS — M6281 Muscle weakness (generalized): Secondary | ICD-10-CM | POA: Diagnosis not present

## 2018-05-08 DIAGNOSIS — I10 Essential (primary) hypertension: Secondary | ICD-10-CM | POA: Diagnosis not present

## 2018-05-08 DIAGNOSIS — R41841 Cognitive communication deficit: Secondary | ICD-10-CM | POA: Diagnosis not present

## 2018-05-08 DIAGNOSIS — R278 Other lack of coordination: Secondary | ICD-10-CM | POA: Diagnosis not present

## 2018-05-08 DIAGNOSIS — I4891 Unspecified atrial fibrillation: Secondary | ICD-10-CM | POA: Diagnosis not present

## 2018-05-09 DIAGNOSIS — I4891 Unspecified atrial fibrillation: Secondary | ICD-10-CM | POA: Diagnosis not present

## 2018-05-09 DIAGNOSIS — F0280 Dementia in other diseases classified elsewhere without behavioral disturbance: Secondary | ICD-10-CM | POA: Diagnosis not present

## 2018-05-09 DIAGNOSIS — G301 Alzheimer's disease with late onset: Secondary | ICD-10-CM | POA: Diagnosis not present

## 2018-05-09 DIAGNOSIS — I10 Essential (primary) hypertension: Secondary | ICD-10-CM | POA: Diagnosis not present

## 2018-05-09 DIAGNOSIS — M6281 Muscle weakness (generalized): Secondary | ICD-10-CM | POA: Diagnosis not present

## 2018-05-09 DIAGNOSIS — R2681 Unsteadiness on feet: Secondary | ICD-10-CM | POA: Diagnosis not present

## 2018-05-09 DIAGNOSIS — D631 Anemia in chronic kidney disease: Secondary | ICD-10-CM | POA: Diagnosis not present

## 2018-05-09 DIAGNOSIS — E1122 Type 2 diabetes mellitus with diabetic chronic kidney disease: Secondary | ICD-10-CM | POA: Diagnosis not present

## 2018-05-09 DIAGNOSIS — L89892 Pressure ulcer of other site, stage 2: Secondary | ICD-10-CM | POA: Diagnosis not present

## 2018-05-09 DIAGNOSIS — R278 Other lack of coordination: Secondary | ICD-10-CM | POA: Diagnosis not present

## 2018-05-09 DIAGNOSIS — R41841 Cognitive communication deficit: Secondary | ICD-10-CM | POA: Diagnosis not present

## 2018-05-09 DIAGNOSIS — F411 Generalized anxiety disorder: Secondary | ICD-10-CM | POA: Diagnosis not present

## 2018-05-09 DIAGNOSIS — E11621 Type 2 diabetes mellitus with foot ulcer: Secondary | ICD-10-CM | POA: Diagnosis not present

## 2018-05-10 DIAGNOSIS — R2681 Unsteadiness on feet: Secondary | ICD-10-CM | POA: Diagnosis not present

## 2018-05-10 DIAGNOSIS — R41841 Cognitive communication deficit: Secondary | ICD-10-CM | POA: Diagnosis not present

## 2018-05-10 DIAGNOSIS — M6281 Muscle weakness (generalized): Secondary | ICD-10-CM | POA: Diagnosis not present

## 2018-05-10 DIAGNOSIS — D631 Anemia in chronic kidney disease: Secondary | ICD-10-CM | POA: Diagnosis not present

## 2018-05-10 DIAGNOSIS — I4891 Unspecified atrial fibrillation: Secondary | ICD-10-CM | POA: Diagnosis not present

## 2018-05-10 DIAGNOSIS — E11621 Type 2 diabetes mellitus with foot ulcer: Secondary | ICD-10-CM | POA: Diagnosis not present

## 2018-05-10 DIAGNOSIS — R278 Other lack of coordination: Secondary | ICD-10-CM | POA: Diagnosis not present

## 2018-05-10 DIAGNOSIS — I10 Essential (primary) hypertension: Secondary | ICD-10-CM | POA: Diagnosis not present

## 2018-05-10 DIAGNOSIS — E1122 Type 2 diabetes mellitus with diabetic chronic kidney disease: Secondary | ICD-10-CM | POA: Diagnosis not present

## 2018-05-11 DIAGNOSIS — R41841 Cognitive communication deficit: Secondary | ICD-10-CM | POA: Diagnosis not present

## 2018-05-11 DIAGNOSIS — R2681 Unsteadiness on feet: Secondary | ICD-10-CM | POA: Diagnosis not present

## 2018-05-11 DIAGNOSIS — E1122 Type 2 diabetes mellitus with diabetic chronic kidney disease: Secondary | ICD-10-CM | POA: Diagnosis not present

## 2018-05-11 DIAGNOSIS — R278 Other lack of coordination: Secondary | ICD-10-CM | POA: Diagnosis not present

## 2018-05-11 DIAGNOSIS — I10 Essential (primary) hypertension: Secondary | ICD-10-CM | POA: Diagnosis not present

## 2018-05-11 DIAGNOSIS — I4891 Unspecified atrial fibrillation: Secondary | ICD-10-CM | POA: Diagnosis not present

## 2018-05-11 DIAGNOSIS — M6281 Muscle weakness (generalized): Secondary | ICD-10-CM | POA: Diagnosis not present

## 2018-05-11 DIAGNOSIS — E11621 Type 2 diabetes mellitus with foot ulcer: Secondary | ICD-10-CM | POA: Diagnosis not present

## 2018-05-11 DIAGNOSIS — D631 Anemia in chronic kidney disease: Secondary | ICD-10-CM | POA: Diagnosis not present

## 2018-05-13 DIAGNOSIS — I4821 Permanent atrial fibrillation: Secondary | ICD-10-CM | POA: Diagnosis not present

## 2018-05-13 DIAGNOSIS — I5032 Chronic diastolic (congestive) heart failure: Secondary | ICD-10-CM | POA: Diagnosis not present

## 2018-05-13 DIAGNOSIS — I10 Essential (primary) hypertension: Secondary | ICD-10-CM | POA: Diagnosis not present

## 2018-05-14 DIAGNOSIS — I4891 Unspecified atrial fibrillation: Secondary | ICD-10-CM | POA: Diagnosis not present

## 2018-05-14 DIAGNOSIS — D631 Anemia in chronic kidney disease: Secondary | ICD-10-CM | POA: Diagnosis not present

## 2018-05-14 DIAGNOSIS — E1122 Type 2 diabetes mellitus with diabetic chronic kidney disease: Secondary | ICD-10-CM | POA: Diagnosis not present

## 2018-05-14 DIAGNOSIS — E11621 Type 2 diabetes mellitus with foot ulcer: Secondary | ICD-10-CM | POA: Diagnosis not present

## 2018-05-14 DIAGNOSIS — M069 Rheumatoid arthritis, unspecified: Secondary | ICD-10-CM | POA: Diagnosis not present

## 2018-05-14 DIAGNOSIS — I10 Essential (primary) hypertension: Secondary | ICD-10-CM | POA: Diagnosis not present

## 2018-05-14 DIAGNOSIS — R278 Other lack of coordination: Secondary | ICD-10-CM | POA: Diagnosis not present

## 2018-05-14 DIAGNOSIS — R2681 Unsteadiness on feet: Secondary | ICD-10-CM | POA: Diagnosis not present

## 2018-05-14 DIAGNOSIS — E119 Type 2 diabetes mellitus without complications: Secondary | ICD-10-CM | POA: Diagnosis not present

## 2018-05-14 DIAGNOSIS — R41841 Cognitive communication deficit: Secondary | ICD-10-CM | POA: Diagnosis not present

## 2018-05-14 DIAGNOSIS — W19XXXD Unspecified fall, subsequent encounter: Secondary | ICD-10-CM | POA: Diagnosis not present

## 2018-05-14 DIAGNOSIS — I959 Hypotension, unspecified: Secondary | ICD-10-CM | POA: Diagnosis not present

## 2018-05-14 DIAGNOSIS — M6281 Muscle weakness (generalized): Secondary | ICD-10-CM | POA: Diagnosis not present

## 2018-05-15 DIAGNOSIS — E1122 Type 2 diabetes mellitus with diabetic chronic kidney disease: Secondary | ICD-10-CM | POA: Diagnosis not present

## 2018-05-15 DIAGNOSIS — R41841 Cognitive communication deficit: Secondary | ICD-10-CM | POA: Diagnosis not present

## 2018-05-15 DIAGNOSIS — R2681 Unsteadiness on feet: Secondary | ICD-10-CM | POA: Diagnosis not present

## 2018-05-15 DIAGNOSIS — M6281 Muscle weakness (generalized): Secondary | ICD-10-CM | POA: Diagnosis not present

## 2018-05-15 DIAGNOSIS — D631 Anemia in chronic kidney disease: Secondary | ICD-10-CM | POA: Diagnosis not present

## 2018-05-15 DIAGNOSIS — I4891 Unspecified atrial fibrillation: Secondary | ICD-10-CM | POA: Diagnosis not present

## 2018-05-15 DIAGNOSIS — I10 Essential (primary) hypertension: Secondary | ICD-10-CM | POA: Diagnosis not present

## 2018-05-15 DIAGNOSIS — R278 Other lack of coordination: Secondary | ICD-10-CM | POA: Diagnosis not present

## 2018-05-15 DIAGNOSIS — E11621 Type 2 diabetes mellitus with foot ulcer: Secondary | ICD-10-CM | POA: Diagnosis not present

## 2018-05-16 DIAGNOSIS — I10 Essential (primary) hypertension: Secondary | ICD-10-CM | POA: Diagnosis not present

## 2018-05-16 DIAGNOSIS — I4891 Unspecified atrial fibrillation: Secondary | ICD-10-CM | POA: Diagnosis not present

## 2018-05-16 DIAGNOSIS — E11621 Type 2 diabetes mellitus with foot ulcer: Secondary | ICD-10-CM | POA: Diagnosis not present

## 2018-05-16 DIAGNOSIS — R41841 Cognitive communication deficit: Secondary | ICD-10-CM | POA: Diagnosis not present

## 2018-05-16 DIAGNOSIS — R2681 Unsteadiness on feet: Secondary | ICD-10-CM | POA: Diagnosis not present

## 2018-05-16 DIAGNOSIS — F0280 Dementia in other diseases classified elsewhere without behavioral disturbance: Secondary | ICD-10-CM | POA: Diagnosis not present

## 2018-05-16 DIAGNOSIS — G301 Alzheimer's disease with late onset: Secondary | ICD-10-CM | POA: Diagnosis not present

## 2018-05-16 DIAGNOSIS — M6281 Muscle weakness (generalized): Secondary | ICD-10-CM | POA: Diagnosis not present

## 2018-05-16 DIAGNOSIS — R278 Other lack of coordination: Secondary | ICD-10-CM | POA: Diagnosis not present

## 2018-05-16 DIAGNOSIS — N390 Urinary tract infection, site not specified: Secondary | ICD-10-CM | POA: Diagnosis not present

## 2018-05-16 DIAGNOSIS — F331 Major depressive disorder, recurrent, moderate: Secondary | ICD-10-CM | POA: Diagnosis not present

## 2018-05-16 DIAGNOSIS — D631 Anemia in chronic kidney disease: Secondary | ICD-10-CM | POA: Diagnosis not present

## 2018-05-16 DIAGNOSIS — E1122 Type 2 diabetes mellitus with diabetic chronic kidney disease: Secondary | ICD-10-CM | POA: Diagnosis not present

## 2018-05-17 DIAGNOSIS — I4891 Unspecified atrial fibrillation: Secondary | ICD-10-CM | POA: Diagnosis not present

## 2018-05-17 DIAGNOSIS — R278 Other lack of coordination: Secondary | ICD-10-CM | POA: Diagnosis not present

## 2018-05-17 DIAGNOSIS — E1122 Type 2 diabetes mellitus with diabetic chronic kidney disease: Secondary | ICD-10-CM | POA: Diagnosis not present

## 2018-05-17 DIAGNOSIS — I10 Essential (primary) hypertension: Secondary | ICD-10-CM | POA: Diagnosis not present

## 2018-05-17 DIAGNOSIS — R41841 Cognitive communication deficit: Secondary | ICD-10-CM | POA: Diagnosis not present

## 2018-05-17 DIAGNOSIS — R2681 Unsteadiness on feet: Secondary | ICD-10-CM | POA: Diagnosis not present

## 2018-05-17 DIAGNOSIS — E11621 Type 2 diabetes mellitus with foot ulcer: Secondary | ICD-10-CM | POA: Diagnosis not present

## 2018-05-17 DIAGNOSIS — M6281 Muscle weakness (generalized): Secondary | ICD-10-CM | POA: Diagnosis not present

## 2018-05-17 DIAGNOSIS — D631 Anemia in chronic kidney disease: Secondary | ICD-10-CM | POA: Diagnosis not present

## 2018-05-18 DIAGNOSIS — R2681 Unsteadiness on feet: Secondary | ICD-10-CM | POA: Diagnosis not present

## 2018-05-18 DIAGNOSIS — R41841 Cognitive communication deficit: Secondary | ICD-10-CM | POA: Diagnosis not present

## 2018-05-18 DIAGNOSIS — I10 Essential (primary) hypertension: Secondary | ICD-10-CM | POA: Diagnosis not present

## 2018-05-18 DIAGNOSIS — E11621 Type 2 diabetes mellitus with foot ulcer: Secondary | ICD-10-CM | POA: Diagnosis not present

## 2018-05-18 DIAGNOSIS — R278 Other lack of coordination: Secondary | ICD-10-CM | POA: Diagnosis not present

## 2018-05-18 DIAGNOSIS — I4891 Unspecified atrial fibrillation: Secondary | ICD-10-CM | POA: Diagnosis not present

## 2018-05-18 DIAGNOSIS — E1122 Type 2 diabetes mellitus with diabetic chronic kidney disease: Secondary | ICD-10-CM | POA: Diagnosis not present

## 2018-05-18 DIAGNOSIS — M6281 Muscle weakness (generalized): Secondary | ICD-10-CM | POA: Diagnosis not present

## 2018-05-18 DIAGNOSIS — D631 Anemia in chronic kidney disease: Secondary | ICD-10-CM | POA: Diagnosis not present

## 2018-05-21 DIAGNOSIS — I4891 Unspecified atrial fibrillation: Secondary | ICD-10-CM | POA: Diagnosis not present

## 2018-05-21 DIAGNOSIS — E1122 Type 2 diabetes mellitus with diabetic chronic kidney disease: Secondary | ICD-10-CM | POA: Diagnosis not present

## 2018-05-21 DIAGNOSIS — R41841 Cognitive communication deficit: Secondary | ICD-10-CM | POA: Diagnosis not present

## 2018-05-21 DIAGNOSIS — M6281 Muscle weakness (generalized): Secondary | ICD-10-CM | POA: Diagnosis not present

## 2018-05-21 DIAGNOSIS — D631 Anemia in chronic kidney disease: Secondary | ICD-10-CM | POA: Diagnosis not present

## 2018-05-21 DIAGNOSIS — E11621 Type 2 diabetes mellitus with foot ulcer: Secondary | ICD-10-CM | POA: Diagnosis not present

## 2018-05-21 DIAGNOSIS — I10 Essential (primary) hypertension: Secondary | ICD-10-CM | POA: Diagnosis not present

## 2018-05-21 DIAGNOSIS — R278 Other lack of coordination: Secondary | ICD-10-CM | POA: Diagnosis not present

## 2018-05-21 DIAGNOSIS — R2681 Unsteadiness on feet: Secondary | ICD-10-CM | POA: Diagnosis not present

## 2018-05-22 DIAGNOSIS — I4891 Unspecified atrial fibrillation: Secondary | ICD-10-CM | POA: Diagnosis not present

## 2018-05-22 DIAGNOSIS — I10 Essential (primary) hypertension: Secondary | ICD-10-CM | POA: Diagnosis not present

## 2018-05-22 DIAGNOSIS — R278 Other lack of coordination: Secondary | ICD-10-CM | POA: Diagnosis not present

## 2018-05-22 DIAGNOSIS — M6281 Muscle weakness (generalized): Secondary | ICD-10-CM | POA: Diagnosis not present

## 2018-05-22 DIAGNOSIS — D631 Anemia in chronic kidney disease: Secondary | ICD-10-CM | POA: Diagnosis not present

## 2018-05-22 DIAGNOSIS — R41841 Cognitive communication deficit: Secondary | ICD-10-CM | POA: Diagnosis not present

## 2018-05-22 DIAGNOSIS — R2681 Unsteadiness on feet: Secondary | ICD-10-CM | POA: Diagnosis not present

## 2018-05-22 DIAGNOSIS — E11621 Type 2 diabetes mellitus with foot ulcer: Secondary | ICD-10-CM | POA: Diagnosis not present

## 2018-05-22 DIAGNOSIS — E1122 Type 2 diabetes mellitus with diabetic chronic kidney disease: Secondary | ICD-10-CM | POA: Diagnosis not present

## 2018-05-23 DIAGNOSIS — R41841 Cognitive communication deficit: Secondary | ICD-10-CM | POA: Diagnosis not present

## 2018-05-23 DIAGNOSIS — R278 Other lack of coordination: Secondary | ICD-10-CM | POA: Diagnosis not present

## 2018-05-23 DIAGNOSIS — I10 Essential (primary) hypertension: Secondary | ICD-10-CM | POA: Diagnosis not present

## 2018-05-23 DIAGNOSIS — D631 Anemia in chronic kidney disease: Secondary | ICD-10-CM | POA: Diagnosis not present

## 2018-05-23 DIAGNOSIS — R2681 Unsteadiness on feet: Secondary | ICD-10-CM | POA: Diagnosis not present

## 2018-05-23 DIAGNOSIS — M6281 Muscle weakness (generalized): Secondary | ICD-10-CM | POA: Diagnosis not present

## 2018-05-23 DIAGNOSIS — E11621 Type 2 diabetes mellitus with foot ulcer: Secondary | ICD-10-CM | POA: Diagnosis not present

## 2018-05-23 DIAGNOSIS — G301 Alzheimer's disease with late onset: Secondary | ICD-10-CM | POA: Diagnosis not present

## 2018-05-23 DIAGNOSIS — F411 Generalized anxiety disorder: Secondary | ICD-10-CM | POA: Diagnosis not present

## 2018-05-23 DIAGNOSIS — M069 Rheumatoid arthritis, unspecified: Secondary | ICD-10-CM | POA: Diagnosis not present

## 2018-05-23 DIAGNOSIS — I4891 Unspecified atrial fibrillation: Secondary | ICD-10-CM | POA: Diagnosis not present

## 2018-05-23 DIAGNOSIS — F331 Major depressive disorder, recurrent, moderate: Secondary | ICD-10-CM | POA: Diagnosis not present

## 2018-05-23 DIAGNOSIS — E1122 Type 2 diabetes mellitus with diabetic chronic kidney disease: Secondary | ICD-10-CM | POA: Diagnosis not present

## 2018-05-24 DIAGNOSIS — R2681 Unsteadiness on feet: Secondary | ICD-10-CM | POA: Diagnosis not present

## 2018-05-24 DIAGNOSIS — M6281 Muscle weakness (generalized): Secondary | ICD-10-CM | POA: Diagnosis not present

## 2018-05-24 DIAGNOSIS — E11621 Type 2 diabetes mellitus with foot ulcer: Secondary | ICD-10-CM | POA: Diagnosis not present

## 2018-05-24 DIAGNOSIS — I10 Essential (primary) hypertension: Secondary | ICD-10-CM | POA: Diagnosis not present

## 2018-05-24 DIAGNOSIS — D631 Anemia in chronic kidney disease: Secondary | ICD-10-CM | POA: Diagnosis not present

## 2018-05-24 DIAGNOSIS — R278 Other lack of coordination: Secondary | ICD-10-CM | POA: Diagnosis not present

## 2018-05-24 DIAGNOSIS — E1122 Type 2 diabetes mellitus with diabetic chronic kidney disease: Secondary | ICD-10-CM | POA: Diagnosis not present

## 2018-05-24 DIAGNOSIS — R41841 Cognitive communication deficit: Secondary | ICD-10-CM | POA: Diagnosis not present

## 2018-05-24 DIAGNOSIS — I4891 Unspecified atrial fibrillation: Secondary | ICD-10-CM | POA: Diagnosis not present

## 2018-05-25 DIAGNOSIS — R278 Other lack of coordination: Secondary | ICD-10-CM | POA: Diagnosis not present

## 2018-05-25 DIAGNOSIS — I10 Essential (primary) hypertension: Secondary | ICD-10-CM | POA: Diagnosis not present

## 2018-05-25 DIAGNOSIS — D631 Anemia in chronic kidney disease: Secondary | ICD-10-CM | POA: Diagnosis not present

## 2018-05-25 DIAGNOSIS — E1122 Type 2 diabetes mellitus with diabetic chronic kidney disease: Secondary | ICD-10-CM | POA: Diagnosis not present

## 2018-05-25 DIAGNOSIS — R2681 Unsteadiness on feet: Secondary | ICD-10-CM | POA: Diagnosis not present

## 2018-05-25 DIAGNOSIS — R41841 Cognitive communication deficit: Secondary | ICD-10-CM | POA: Diagnosis not present

## 2018-05-25 DIAGNOSIS — I4891 Unspecified atrial fibrillation: Secondary | ICD-10-CM | POA: Diagnosis not present

## 2018-05-25 DIAGNOSIS — E11621 Type 2 diabetes mellitus with foot ulcer: Secondary | ICD-10-CM | POA: Diagnosis not present

## 2018-05-25 DIAGNOSIS — M6281 Muscle weakness (generalized): Secondary | ICD-10-CM | POA: Diagnosis not present

## 2018-05-26 ENCOUNTER — Emergency Department (HOSPITAL_COMMUNITY)
Admission: EM | Admit: 2018-05-26 | Discharge: 2018-05-26 | Disposition: A | Discharge status: Home / Self Care | Payer: Medicare PPO | Attending: Emergency Medicine | Admitting: Emergency Medicine

## 2018-05-26 ENCOUNTER — Other Ambulatory Visit: Payer: Self-pay

## 2018-05-26 ENCOUNTER — Encounter (HOSPITAL_COMMUNITY): Payer: Self-pay | Admitting: Emergency Medicine

## 2018-05-26 ENCOUNTER — Emergency Department (HOSPITAL_COMMUNITY): Payer: Medicare PPO

## 2018-05-26 DIAGNOSIS — Z23 Encounter for immunization: Secondary | ICD-10-CM | POA: Diagnosis not present

## 2018-05-26 DIAGNOSIS — R58 Hemorrhage, not elsewhere classified: Secondary | ICD-10-CM | POA: Diagnosis not present

## 2018-05-26 DIAGNOSIS — I4891 Unspecified atrial fibrillation: Secondary | ICD-10-CM | POA: Insufficient documentation

## 2018-05-26 DIAGNOSIS — Z7901 Long term (current) use of anticoagulants: Secondary | ICD-10-CM | POA: Insufficient documentation

## 2018-05-26 DIAGNOSIS — S61215A Laceration without foreign body of left ring finger without damage to nail, initial encounter: Secondary | ICD-10-CM

## 2018-05-26 DIAGNOSIS — E119 Type 2 diabetes mellitus without complications: Secondary | ICD-10-CM | POA: Diagnosis not present

## 2018-05-26 DIAGNOSIS — R5381 Other malaise: Secondary | ICD-10-CM | POA: Diagnosis not present

## 2018-05-26 DIAGNOSIS — S6992XA Unspecified injury of left wrist, hand and finger(s), initial encounter: Secondary | ICD-10-CM | POA: Diagnosis not present

## 2018-05-26 DIAGNOSIS — R279 Unspecified lack of coordination: Secondary | ICD-10-CM | POA: Diagnosis not present

## 2018-05-26 DIAGNOSIS — Z86718 Personal history of other venous thrombosis and embolism: Secondary | ICD-10-CM | POA: Diagnosis not present

## 2018-05-26 DIAGNOSIS — W050XXA Fall from non-moving wheelchair, initial encounter: Secondary | ICD-10-CM | POA: Diagnosis not present

## 2018-05-26 DIAGNOSIS — I509 Heart failure, unspecified: Secondary | ICD-10-CM | POA: Diagnosis not present

## 2018-05-26 DIAGNOSIS — Y999 Unspecified external cause status: Secondary | ICD-10-CM | POA: Diagnosis not present

## 2018-05-26 DIAGNOSIS — Z79899 Other long term (current) drug therapy: Secondary | ICD-10-CM | POA: Diagnosis not present

## 2018-05-26 DIAGNOSIS — I10 Essential (primary) hypertension: Secondary | ICD-10-CM | POA: Diagnosis not present

## 2018-05-26 DIAGNOSIS — Y92129 Unspecified place in nursing home as the place of occurrence of the external cause: Secondary | ICD-10-CM | POA: Diagnosis not present

## 2018-05-26 DIAGNOSIS — Y939 Activity, unspecified: Secondary | ICD-10-CM | POA: Diagnosis not present

## 2018-05-26 DIAGNOSIS — Z743 Need for continuous supervision: Secondary | ICD-10-CM | POA: Diagnosis not present

## 2018-05-26 DIAGNOSIS — W19XXXA Unspecified fall, initial encounter: Secondary | ICD-10-CM | POA: Diagnosis not present

## 2018-05-26 IMAGING — CR DG HAND COMPLETE 3+V*L*
3 series · 3 of 3 positions shown · non-contrast
Comparison: None.

CLINICAL DATA: Trauma to the fourth digit.

EXAM:
LEFT HAND - COMPLETE 3+ VIEW

[hand pa]
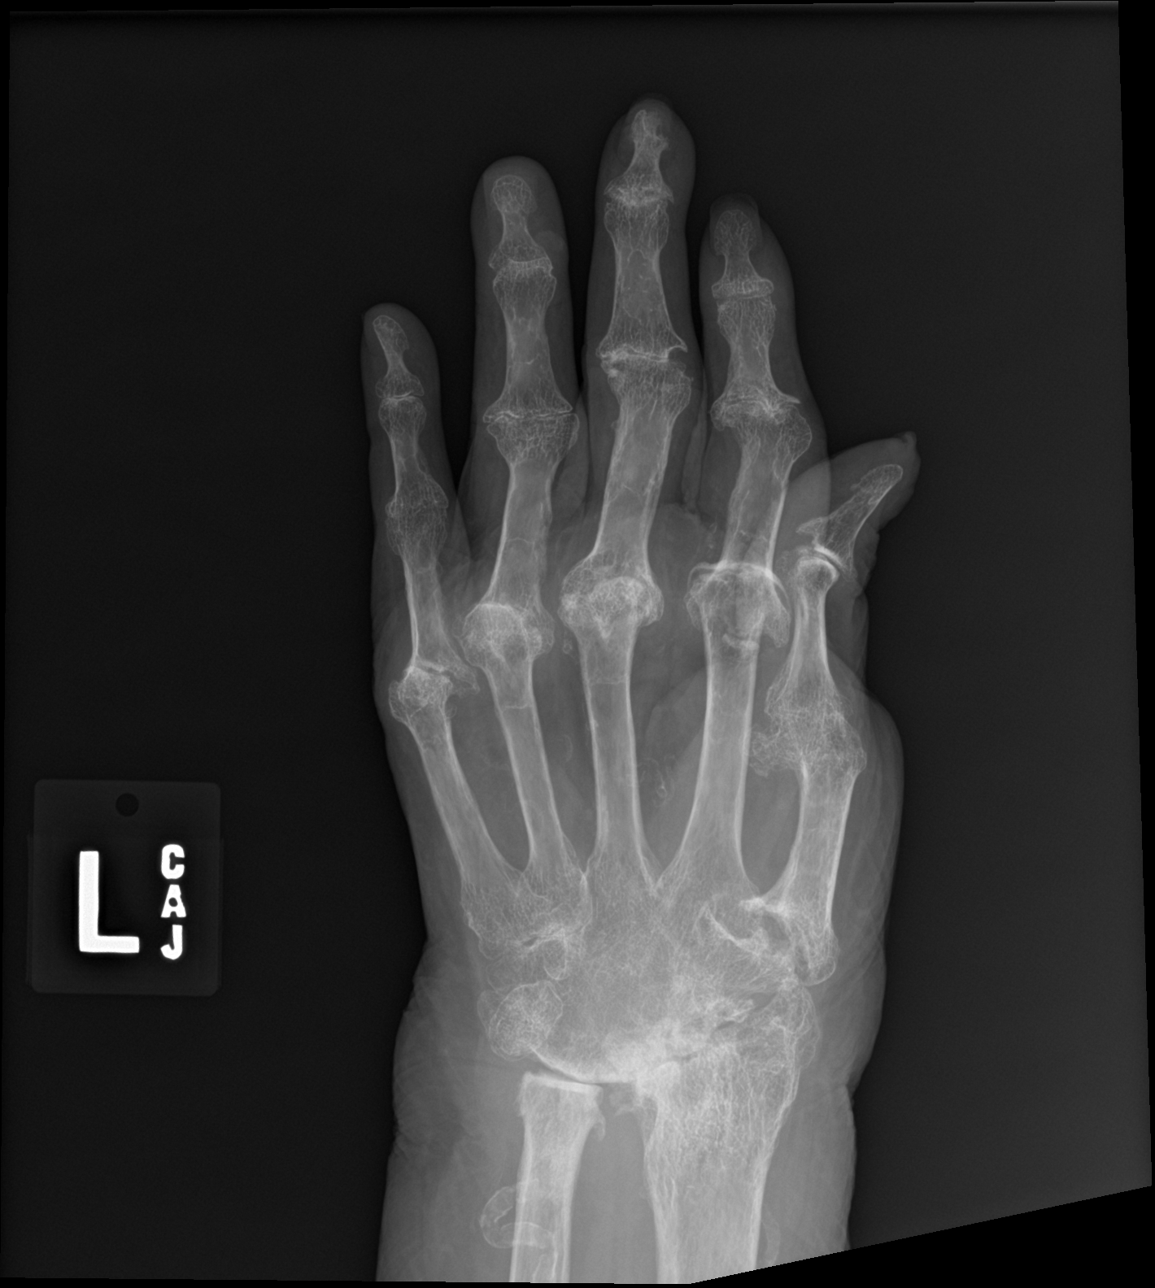

[hand obl]
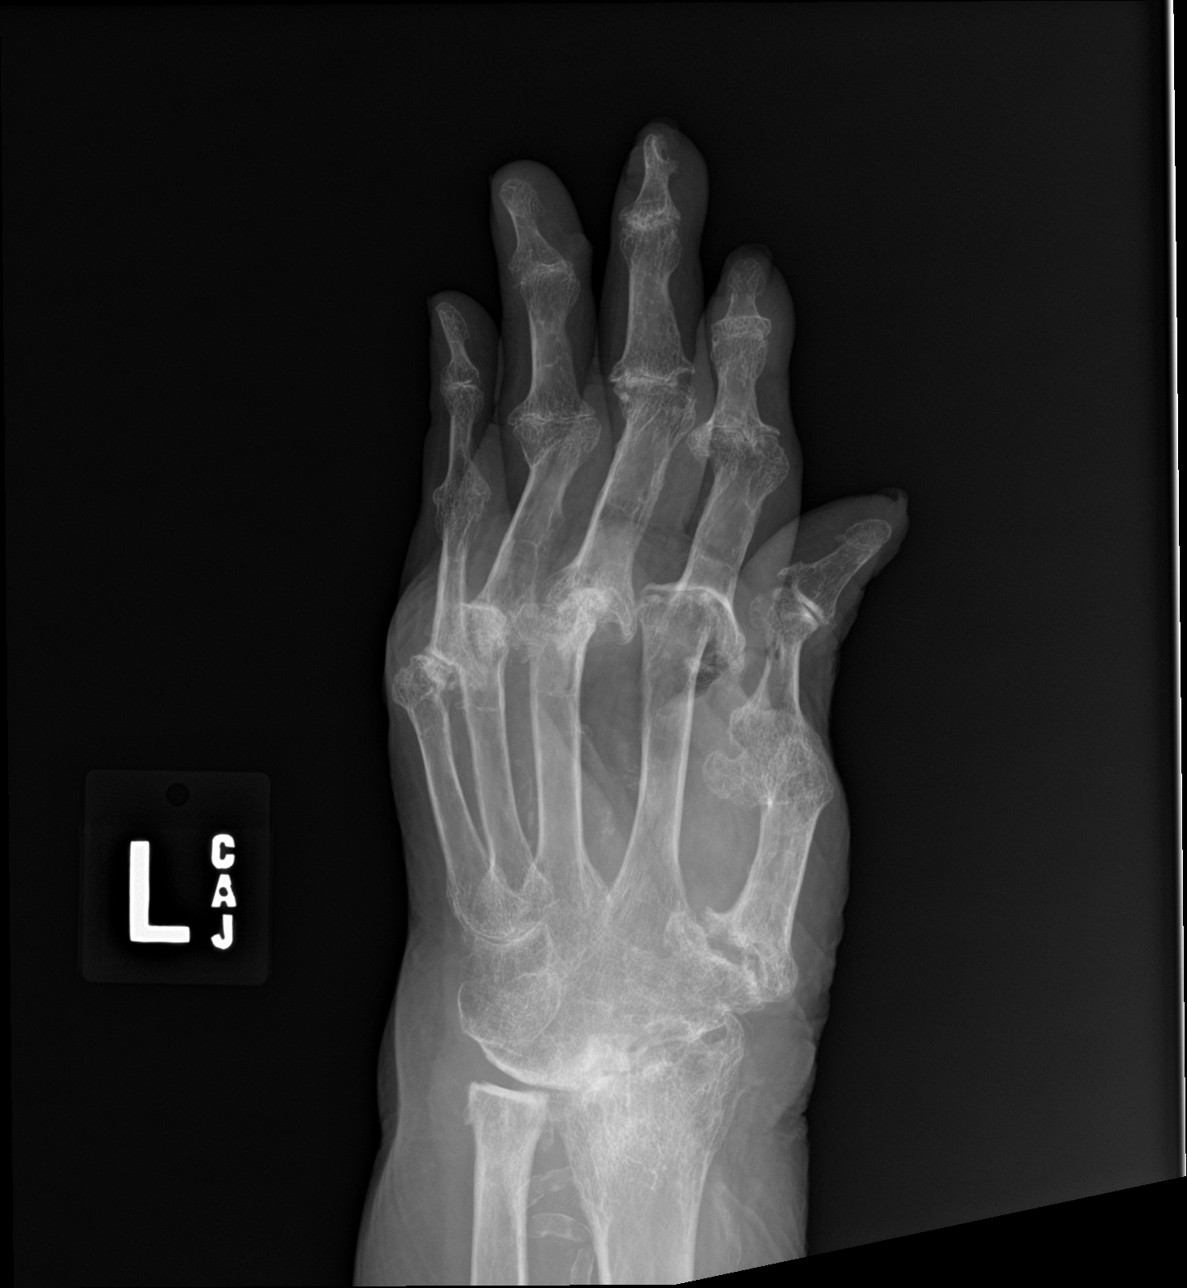

[hand lat]
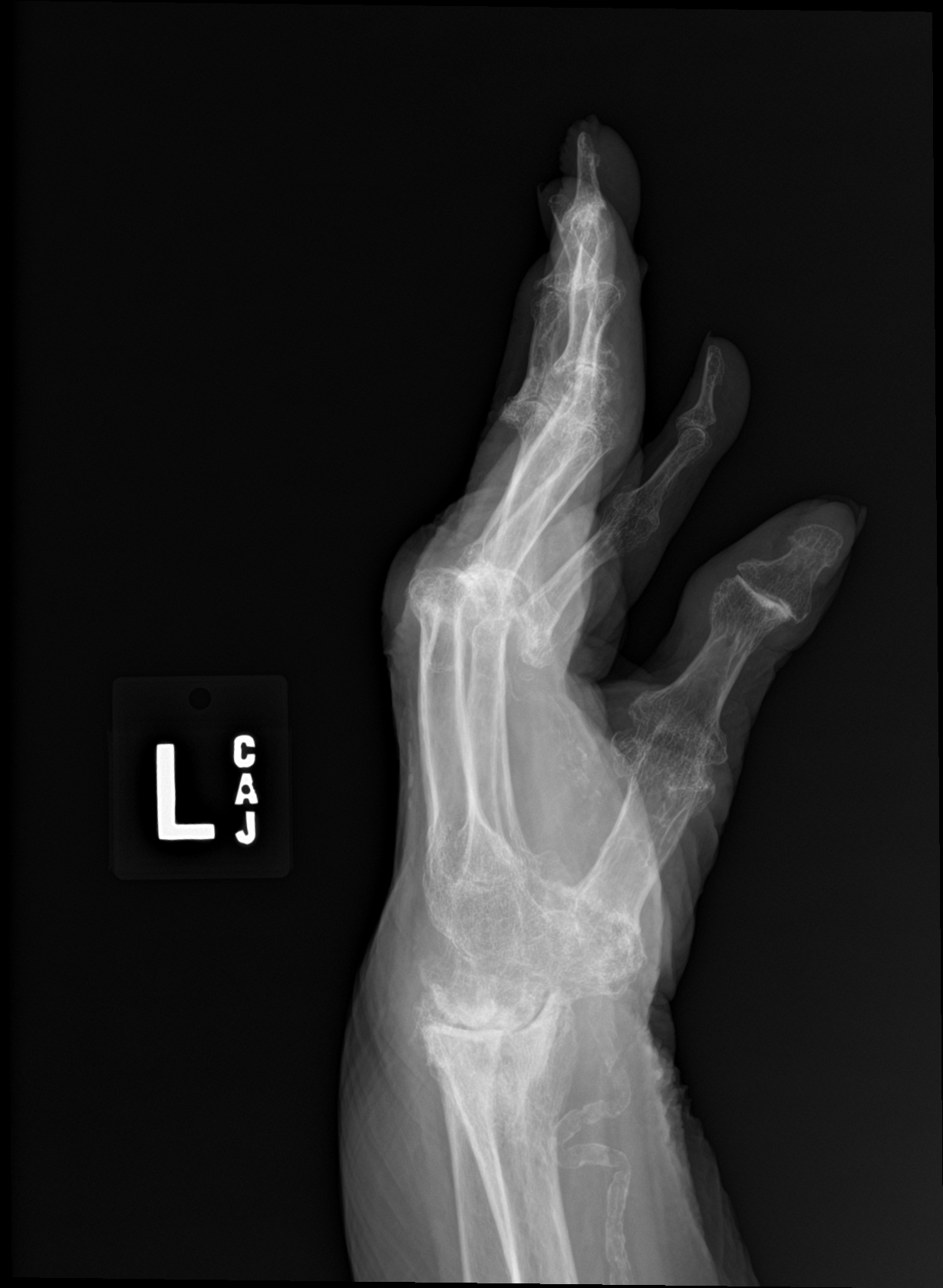

[3 of 3 positions shown; findings below may reference images not displayed]

FINDINGS: There is no evidence of fracture or dislocation. Chronic subluxation
of all metacarpophalangeal joints are identified. Extensive chronic
changes are identified throughout the left hand and digits.
IMPRESSION: No acute fracture or dislocation.

## 2018-05-26 MED ORDER — ACETAMINOPHEN 325 MG PO TABS: 650.0000 mg | ORAL_TABLET | Freq: Once | ORAL | Status: DC

## 2018-05-26 MED ORDER — TETANUS-DIPHTH-ACELL PERTUSSIS 5-2.5-18.5 LF-MCG/0.5 IM SUSP
0.5000 mL | Freq: Once | INTRAMUSCULAR | Status: AC
  Administered 2018-05-26: 0.5 mL via INTRAMUSCULAR
  Filled 2018-05-26: qty 0.5

## 2018-05-26 MED ORDER — LIDOCAINE HCL (PF) 1 % IJ SOLN
30.0000 mL | Freq: Once | INTRAMUSCULAR | Status: AC
  Administered 2018-05-26: 30 mL
  Filled 2018-05-26: qty 30

## 2018-05-26 NOTE — ED Notes (Signed)
Patient transported to X-ray 

## 2018-05-26 NOTE — Discharge Instructions (Addendum)
She should have the sutures taken out in 10 to 14 days She can be given Tylenol 500mg  every 6 hours for pain

## 2018-05-26 NOTE — ED Notes (Signed)
Called [facility] 

## 2018-05-26 NOTE — ED Provider Notes (Signed)
[facility]   CSN: [id number] Arrival date & time: 05/26/18  0140     History   Chief Complaint Chief Complaint  Patient presents with  . Fall    HPI [name] is a 85 y.o. female.  The history is provided by the patient.  Fall  This is a new problem. The problem occurs constantly. The problem has not changed since onset.Pertinent negatives include no chest pain and no headaches. Nothing aggravates the symptoms. Nothing relieves the symptoms.   Patient presents from nursing facility after injuring her left ring finger.  She was trying to have a wheelchair when she slipped and bent her finger.  She did not fall or hit her head.  She has very minimal pain in the left hand.  She is on Eliquis daily. Past Medical History:  Diagnosis Date  . Acute pancreatitis   . Anemia   . Arthritis   . Atrial fibrillation (HCC)   . CAD (coronary artery disease)   . Carotid artery occlusion   . CHF (congestive heart failure) (HCC)   . Diabetes mellitus age 85  . DJD (degenerative joint disease)   . DVT (deep venous thrombosis) (HCC)   . GERD (gastroesophageal reflux disease)   . GI bleed   . Hyperlipidemia   . Hypertension   . Joint pain   . Mesenteric ischemia   . Peripheral vascular disease (HCC)   . Thyroid disease     Patient Active Problem List   Diagnosis Date Noted  . AKI (acute kidney injury) (HCC) 01/17/2018  . MDD (major depressive disorder) 01/15/2018  . Macrocytic anemia 09/22/2017  . Proteinuria 04/06/2017  . Insomnia 12/01/2016  . CAD (coronary artery disease) 11/17/2016  . Hyperlipidemia 11/17/2016  . Hypertension 11/17/2016  . Aortic atherosclerosis (HCC) 11/03/2016  . CKD (chronic kidney disease) stage 3, GFR 30-59 ml/min (HCC) 04/14/2015  . Colonic constipation 04/14/2015  . Frequent UTI 04/01/2015  . Diabetes mellitus with renal complications (HCC) 03/03/2015  . Atrophic vaginitis 02/24/2015  .  Rheumatoid arthritis (HCC) 02/18/2015  . DNR (do not resuscitate) 02/13/2015  . Occlusion and stenosis of carotid artery without mention of cerebral infarction 05/02/2013  . 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 FEMORAL BRUIT 11/24/2009  . Hypothyroidism 06/26/2009  . CEREBROVASCULAR DISEASE 06/25/2009  . ATRIAL FIBRILLATION 05/19/2008  . Chronic diastolic heart failure (HCC) 05/19/2008  . RENAL ARTERY STENOSIS 05/19/2008  . PERIPHERAL VASCULAR DISEASE 05/19/2008    Past Surgical History:  Procedure Laterality Date  . APPENDECTOMY    . CELIAC ARTERY STENT    . CORONARY ARTERY BYPASS GRAFT    . EMBOLECTOMY  2009   right leg  . FASCIOTOMY     right leg  . HIP FRACTURE SURGERY Left   . JOINT REPLACEMENT    . TOTAL KNEE ARTHROPLASTY     bilateral  . TUBAL LIGATION    . VISCERAL ANGIOGRAM N/A 05/31/2013   Procedure: MESSENTERIC Rosalin HawkingANGIOGRAM;  Surgeon: Sherren Kernsharles E Fields, MD;  [facility];  Service: Cardiovascular;  Laterality: N/A;     OB History   No obstetric history on file.      Home Medications    Prior to Admission medications   Medication Sig Start Date End Date Taking? Authorizing Provider  AMBULATORY NON FORMULARY MEDICATION Please check INR weekly for 2 months. Send reports to Dr Denyse Amassorey fax [phone] 12/19/16   Rodolph Bongorey, Evan S, MD  apixaban Everlene Balls(ELIQUIS) 5 MG  TABS tablet Take 1 tablet (5 mg total) by mouth 2 (two) times daily. 11/21/17   Lewayne Bunting, MD  Calcium Carb-Cholecalciferol 600-800 MG-UNIT TABS TAKE ONE TABLET BY MOUTH 2 TIMES A DAY Patient taking differently: Take 1 tablet by mouth 2 (two) times daily.  09/11/15   Rodolph Bong, MD  clonazePAM (KLONOPIN) 0.5 MG tablet Take 1 tablet (0.5 mg total) by mouth at bedtime. 01/19/18   Jerald Kief, MD  Cranberry 500 MG CAPS Take 500 mg by mouth daily.     [provider]  diltiazem (CARDIZEM CD) 180 MG 24 hr capsule Take 1 capsule (180 mg total) by mouth at bedtime. 05/24/17   Rodolph Bong, MD  Ergocalciferol (VITAMIN D2) 2000 units TABS Take 2,000 Units by mouth daily.     [provider]  Ferrous Bisglycinate Chelate 15 MG TABS Take 1 tablet by mouth 2 (two) times daily. Patient not taking: Reported on 02/12/2018 10/20/17   Rodolph Bong, MD  ferrous sulfate 325 (65 FE) MG tablet Take 325 mg by mouth daily with breakfast.    [provider]  folic acid (FOLVITE) 1 MG tablet Take 1 tablet (1 mg total) by mouth daily. 05/24/17   Rodolph Bong, MD  glucose blood test strip Once daily E11.29 03/03/15   Rodolph Bong, MD  levothyroxine (SYNTHROID, LEVOTHROID) 50 MCG tablet Take 1 tablet (50 mcg total) by mouth daily. Patient not taking: Reported on 02/12/2018 05/24/17   Rodolph Bong, MD  methotrexate (RHEUMATREX) 10 MG tablet Take 20 mg by mouth once a week. On Friday  Caution: Chemotherapy. Protect from light.    [provider]  metoprolol succinate (TOPROL-XL) 50 MG 24 hr tablet Take 1 tablet (50 mg total) by mouth daily. 05/24/17   Rodolph Bong, MD  PARoxetine (PAXIL) 10 MG tablet Take 1 tablet (10 mg total) by mouth daily. Patient not taking: Reported on 02/12/2018 01/16/18   Rodolph Bong, MD  pravastatin (PRAVACHOL) 40 MG tablet TAKE 1 TABLET (40 MG TOTAL) BY MOUTH DAILY. 01/03/18   Rodolph Bong, MD  PREMARIN vaginal cream PLACE 1 APPLICATORFUL VAGINALLY ONCE A DAY Patient taking differently: Place 1 Applicatorful vaginally daily.  12/08/17   Rodolph Bong, MD  STOOL SOFTENER 100 MG capsule TAKE 1 CAPSULE BY MOUTH 2 TIMES A DAY (AM & PM) Patient taking differently: Take 100 mg by mouth 2 (two) times daily.  09/11/15   Rodolph Bong, MD  torsemide (DEMADEX) 100 MG tablet Take 0.5 tablets (50 mg total) by mouth 2 (two) times daily. 05/24/17   Rodolph Bong, MD  traMADol (ULTRAM) 50 MG tablet Take 1 tablet (50 mg total) by mouth 2 (two) times daily. Patient taking differently: Take 100 mg by mouth once.  01/19/18   Jerald Kief, MD  traMADol (ULTRAM)  50 MG tablet Take 50 mg by mouth every 12 (twelve) hours as needed for moderate pain.    [provider]  traZODone (DESYREL) 50 MG tablet Take 0.5-1 tablets (25-50 mg total) by mouth at bedtime as needed for sleep. Patient not taking: Reported on 02/12/2018 12/01/16   Rodolph Bong, MD  vitamin B-12 (CYANOCOBALAMIN) 1000 MCG tablet Take 1,000 mcg by mouth daily.    [provider]    Family History Family History  Problem Relation Age of Onset  . Cancer Mother        ovarian  . Other Father  suicide  . Coronary artery disease Sister   . Other Sister        alzheimers  . Heart disease Sister        Before age 4  . Diabetes Sister   . Cancer Daughter        spinal tumor  . Diabetes Daughter   . Hypertension Daughter   . Diabetes Son   . Hyperlipidemia Son   . Hypertension Son     Social History Social History   Tobacco Use  . Smoking status: Never Smoker  . Smokeless tobacco: Never Used  Substance Use Topics  . Alcohol use: No  . Drug use: No     Allergies   Cefuroxime; Fish allergy; Ciprofloxacin; Codeine; Sertraline hcl; Cephalexin; and Paxil [paroxetine hcl]   Review of Systems Review of Systems  Cardiovascular: Negative for chest pain.  Skin: Positive for wound.  Neurological: Negative for headaches.     Physical Exam Updated Vital Signs BP (!) 154/78   Pulse 60   Temp 98.3 F (36.8 C) (Oral)   Resp 16   SpO2 98%   Physical Exam  CONSTITUTIONAL: Elderly, no acute distress HEAD: Normocephalic/atraumatic, no visible trauma EYES: EOMI/PERRL ENMT: Mucous membranes moist NECK: supple no meningeal signs SPINE/BACK:entire spine nontender CV: S1/S2 noted, loud murmur noted LUNGS: Lungs are clear to auscultation bilaterally, no apparent distress ABDOMEN: soft NEURO: Pt is awake/alert/appropriate, moves all extremitiesx4.  No facial droop.   EXTREMITIES: pulses normal/equal, full ROM, see photo Pelvis stable Deformities noted  to left middle and ring finger. Rheumatoid deformities noted to right hand SKIN: warm, color normal PSYCH: no abnormalities of mood noted, alert and oriented to situation    Patient gave verbal permission to utilize photo for medical documentation only The image was not stored on any personal device  ED Treatments / Results  Labs (all labs ordered are listed, but only abnormal results are displayed) Labs Reviewed - No data to display  EKG None  Radiology Dg Hand Complete Left  Result Date: 05/26/2018 CLINICAL DATA:  Trauma to the fourth digit. EXAM: LEFT HAND - COMPLETE 3+ VIEW COMPARISON:  None. FINDINGS: There is no evidence of fracture or dislocation. Chronic subluxation of all metacarpophalangeal joints are identified. Extensive chronic changes are identified throughout the left hand and digits. IMPRESSION: No acute fracture or dislocation. Electronically Signed   By: Sherian Rein M.D.   On: 05/26/2018 02:48    Procedures .Marland KitchenLaceration Repair Date/Time: 05/26/2018 5:21 AM Performed by: Zadie Rhine, MD Authorized by: Zadie Rhine, MD   Consent:    Consent obtained:  Verbal   Consent given by:  Patient Anesthesia (see MAR for exact dosages):    Anesthesia method:  Nerve block Laceration details:    Location:  Finger   Finger location:  L ring finger   Length (cm):  2 Repair type:    Repair type:  Simple Pre-procedure details:    Preparation:  Patient was prepped and draped in usual sterile fashion Exploration:    Wound exploration: wound explored through full range of motion and entire depth of wound probed and visualized     Contaminated: no   Treatment:    Area cleansed with:  Betadine   Amount of cleaning:  Standard Skin repair:    Repair method:  Sutures   Suture size:  5-0   Suture material:  Prolene   Suture technique:  Simple interrupted   Number of sutures:  3 Approximation:    Approximation:  Loose Post-procedure details:    Patient  tolerance of procedure:  Tolerated well, no immediate complications  .Nerve Block Date/Time: 05/26/2018 6:38 AM Performed by: Zadie RhineWickline, Ilka Lovick, MD Authorized by: Zadie RhineWickline, Vicie Cech, MD   Consent:    Consent obtained:  Verbal   Consent given by:  Patient Indications:    Indications:  Procedural anesthesia Location:    Body area:  Upper extremity   Upper extremity nerve blocked: Left ring finger digital block.   Laterality:  Left Procedure details (see MAR for exact dosages):    Block needle gauge:  25 G   Anesthetic injected:  Lidocaine 1% w/o epi   Paresthesia:  None Post-procedure details:    Patient tolerance of procedure:  Tolerated well, no immediate complications  SPLINT APPLICATION Date/Time: 5:23 AM Authorized by: Joya Gaskinsonald W Nolita Kutter Consent: Verbal consent obtained. Risks and benefits: risks, benefits and alternatives were discussed Consent given by: patient Splint applied ZO:XWRUEby:nurse   Location details: left ring finger Splint type: finger splint Supplies used: finger splint Post-procedure: The splinted body part was neurovascularly unchanged following the procedure. Patient tolerance: Patient tolerated the procedure well with no immediate complications.     Medications Ordered in ED Medications  acetaminophen (TYLENOL) tablet 650 mg (650 mg Oral Refused 05/26/18 0633)  Tdap (BOOSTRIX) injection 0.5 mL (0.5 mLs Intramuscular Given 05/26/18 0230)  lidocaine (PF) (XYLOCAINE) 1 % injection 30 mL (30 mLs Infiltration Given by Other 05/26/18 0536)     Initial Impression / Assessment and Plan / ED Course  I have reviewed the triage vital signs and the nursing notes.  Pertinent imaging results that were available during my care of the patient were reviewed by me and considered in my medical decision making (see chart for details).     Patient presented for isolated injury to her left ring finger.  Laceration was repaired without incident. She appeared to have  deformities, but x-rays reveal that is likely chronic.  However she did have significant pain over the PIP of the left ring finger.  She felt that it appeared different.  After nerve block, I was able to manipulate the finger and she felt improved.  When compared to her right hand she has significant chronic deformities that appear similar in both hands.  No bony fracture. Splint applied by nursing. Patient to be discharged back to nursing facility  Final Clinical Impressions(s) / ED Diagnoses   Final diagnoses:  Laceration of left ring finger without foreign body without damage to nail, initial encounter    ED Discharge Orders    None       Zadie RhineWickline, Tashanti Dalporto, MD 05/26/18 [phone]

## 2018-05-26 NOTE — ED Notes (Signed)
ED Provider at bedside. 

## 2018-05-26 NOTE — ED Notes (Signed)
Attempted report to [facility] with no answer multiple times.

## 2018-05-26 NOTE — ED Triage Notes (Signed)
Pt arrives to ED from [facility] with complaints of witnessed fall since 0100. EMS reports pt was getting out of her wheelchair when she slipped up and hurt her left ring finger. Finger looks bent backwards and has a laceration to it. Bleeding is controlled. Pt did not hit head. Pt does not complain of pain.  Pt placed in position of comfort with bed locked and lowered, call bell in reach.

## 2018-05-28 DIAGNOSIS — S61215D Laceration without foreign body of left ring finger without damage to nail, subsequent encounter: Secondary | ICD-10-CM | POA: Diagnosis not present

## 2018-05-28 DIAGNOSIS — R278 Other lack of coordination: Secondary | ICD-10-CM | POA: Diagnosis not present

## 2018-05-28 DIAGNOSIS — D631 Anemia in chronic kidney disease: Secondary | ICD-10-CM | POA: Diagnosis not present

## 2018-05-28 DIAGNOSIS — I5032 Chronic diastolic (congestive) heart failure: Secondary | ICD-10-CM | POA: Diagnosis not present

## 2018-05-28 DIAGNOSIS — R41841 Cognitive communication deficit: Secondary | ICD-10-CM | POA: Diagnosis not present

## 2018-05-28 DIAGNOSIS — I10 Essential (primary) hypertension: Secondary | ICD-10-CM | POA: Diagnosis not present

## 2018-05-28 DIAGNOSIS — E1122 Type 2 diabetes mellitus with diabetic chronic kidney disease: Secondary | ICD-10-CM | POA: Diagnosis not present

## 2018-05-28 DIAGNOSIS — M6281 Muscle weakness (generalized): Secondary | ICD-10-CM | POA: Diagnosis not present

## 2018-05-28 DIAGNOSIS — E11621 Type 2 diabetes mellitus with foot ulcer: Secondary | ICD-10-CM | POA: Diagnosis not present

## 2018-05-28 DIAGNOSIS — R2681 Unsteadiness on feet: Secondary | ICD-10-CM | POA: Diagnosis not present

## 2018-05-28 DIAGNOSIS — I4891 Unspecified atrial fibrillation: Secondary | ICD-10-CM | POA: Diagnosis not present

## 2018-05-28 DIAGNOSIS — W19XXXD Unspecified fall, subsequent encounter: Secondary | ICD-10-CM | POA: Diagnosis not present

## 2018-05-29 DIAGNOSIS — D631 Anemia in chronic kidney disease: Secondary | ICD-10-CM | POA: Diagnosis not present

## 2018-05-29 DIAGNOSIS — M6281 Muscle weakness (generalized): Secondary | ICD-10-CM | POA: Diagnosis not present

## 2018-05-29 DIAGNOSIS — E11621 Type 2 diabetes mellitus with foot ulcer: Secondary | ICD-10-CM | POA: Diagnosis not present

## 2018-05-29 DIAGNOSIS — I4891 Unspecified atrial fibrillation: Secondary | ICD-10-CM | POA: Diagnosis not present

## 2018-05-29 DIAGNOSIS — R278 Other lack of coordination: Secondary | ICD-10-CM | POA: Diagnosis not present

## 2018-05-29 DIAGNOSIS — R2681 Unsteadiness on feet: Secondary | ICD-10-CM | POA: Diagnosis not present

## 2018-05-29 DIAGNOSIS — E1122 Type 2 diabetes mellitus with diabetic chronic kidney disease: Secondary | ICD-10-CM | POA: Diagnosis not present

## 2018-05-29 DIAGNOSIS — I10 Essential (primary) hypertension: Secondary | ICD-10-CM | POA: Diagnosis not present

## 2018-05-29 DIAGNOSIS — R41841 Cognitive communication deficit: Secondary | ICD-10-CM | POA: Diagnosis not present

## 2018-05-30 DIAGNOSIS — E11621 Type 2 diabetes mellitus with foot ulcer: Secondary | ICD-10-CM | POA: Diagnosis not present

## 2018-05-30 DIAGNOSIS — D631 Anemia in chronic kidney disease: Secondary | ICD-10-CM | POA: Diagnosis not present

## 2018-05-30 DIAGNOSIS — E1122 Type 2 diabetes mellitus with diabetic chronic kidney disease: Secondary | ICD-10-CM | POA: Diagnosis not present

## 2018-05-30 DIAGNOSIS — I4891 Unspecified atrial fibrillation: Secondary | ICD-10-CM | POA: Diagnosis not present

## 2018-05-30 DIAGNOSIS — F331 Major depressive disorder, recurrent, moderate: Secondary | ICD-10-CM | POA: Diagnosis not present

## 2018-05-30 DIAGNOSIS — F0280 Dementia in other diseases classified elsewhere without behavioral disturbance: Secondary | ICD-10-CM | POA: Diagnosis not present

## 2018-05-30 DIAGNOSIS — M6281 Muscle weakness (generalized): Secondary | ICD-10-CM | POA: Diagnosis not present

## 2018-05-30 DIAGNOSIS — R2681 Unsteadiness on feet: Secondary | ICD-10-CM | POA: Diagnosis not present

## 2018-05-30 DIAGNOSIS — G301 Alzheimer's disease with late onset: Secondary | ICD-10-CM | POA: Diagnosis not present

## 2018-05-30 DIAGNOSIS — I10 Essential (primary) hypertension: Secondary | ICD-10-CM | POA: Diagnosis not present

## 2018-05-30 DIAGNOSIS — R278 Other lack of coordination: Secondary | ICD-10-CM | POA: Diagnosis not present

## 2018-05-30 DIAGNOSIS — R41841 Cognitive communication deficit: Secondary | ICD-10-CM | POA: Diagnosis not present

## 2018-05-31 DIAGNOSIS — E1122 Type 2 diabetes mellitus with diabetic chronic kidney disease: Secondary | ICD-10-CM | POA: Diagnosis not present

## 2018-05-31 DIAGNOSIS — E11621 Type 2 diabetes mellitus with foot ulcer: Secondary | ICD-10-CM | POA: Diagnosis not present

## 2018-05-31 DIAGNOSIS — R278 Other lack of coordination: Secondary | ICD-10-CM | POA: Diagnosis not present

## 2018-05-31 DIAGNOSIS — R2681 Unsteadiness on feet: Secondary | ICD-10-CM | POA: Diagnosis not present

## 2018-05-31 DIAGNOSIS — D631 Anemia in chronic kidney disease: Secondary | ICD-10-CM | POA: Diagnosis not present

## 2018-05-31 DIAGNOSIS — M6281 Muscle weakness (generalized): Secondary | ICD-10-CM | POA: Diagnosis not present

## 2018-05-31 DIAGNOSIS — I10 Essential (primary) hypertension: Secondary | ICD-10-CM | POA: Diagnosis not present

## 2018-05-31 DIAGNOSIS — R41841 Cognitive communication deficit: Secondary | ICD-10-CM | POA: Diagnosis not present

## 2018-05-31 DIAGNOSIS — I4891 Unspecified atrial fibrillation: Secondary | ICD-10-CM | POA: Diagnosis not present

## 2018-06-01 DIAGNOSIS — R41841 Cognitive communication deficit: Secondary | ICD-10-CM | POA: Diagnosis not present

## 2018-06-01 DIAGNOSIS — E11621 Type 2 diabetes mellitus with foot ulcer: Secondary | ICD-10-CM | POA: Diagnosis not present

## 2018-06-01 DIAGNOSIS — R278 Other lack of coordination: Secondary | ICD-10-CM | POA: Diagnosis not present

## 2018-06-01 DIAGNOSIS — M6281 Muscle weakness (generalized): Secondary | ICD-10-CM | POA: Diagnosis not present

## 2018-06-01 DIAGNOSIS — I10 Essential (primary) hypertension: Secondary | ICD-10-CM | POA: Diagnosis not present

## 2018-06-01 DIAGNOSIS — R2681 Unsteadiness on feet: Secondary | ICD-10-CM | POA: Diagnosis not present

## 2018-06-01 DIAGNOSIS — D631 Anemia in chronic kidney disease: Secondary | ICD-10-CM | POA: Diagnosis not present

## 2018-06-01 DIAGNOSIS — E1122 Type 2 diabetes mellitus with diabetic chronic kidney disease: Secondary | ICD-10-CM | POA: Diagnosis not present

## 2018-06-01 DIAGNOSIS — I4891 Unspecified atrial fibrillation: Secondary | ICD-10-CM | POA: Diagnosis not present

## 2018-06-05 DIAGNOSIS — E1142 Type 2 diabetes mellitus with diabetic polyneuropathy: Secondary | ICD-10-CM | POA: Diagnosis not present

## 2018-06-05 DIAGNOSIS — M2042 Other hammer toe(s) (acquired), left foot: Secondary | ICD-10-CM | POA: Diagnosis not present

## 2018-06-05 DIAGNOSIS — L84 Corns and callosities: Secondary | ICD-10-CM | POA: Diagnosis not present

## 2018-06-05 DIAGNOSIS — E11621 Type 2 diabetes mellitus with foot ulcer: Secondary | ICD-10-CM | POA: Diagnosis not present

## 2018-06-05 DIAGNOSIS — L97412 Non-pressure chronic ulcer of right heel and midfoot with fat layer exposed: Secondary | ICD-10-CM | POA: Diagnosis not present

## 2018-06-05 DIAGNOSIS — S98131A Complete traumatic amputation of one right lesser toe, initial encounter: Secondary | ICD-10-CM | POA: Diagnosis not present

## 2018-06-05 DIAGNOSIS — M2041 Other hammer toe(s) (acquired), right foot: Secondary | ICD-10-CM | POA: Diagnosis not present

## 2018-06-10 DIAGNOSIS — E119 Type 2 diabetes mellitus without complications: Secondary | ICD-10-CM | POA: Diagnosis not present

## 2018-06-10 DIAGNOSIS — I5032 Chronic diastolic (congestive) heart failure: Secondary | ICD-10-CM | POA: Diagnosis not present

## 2018-06-10 DIAGNOSIS — N183 Chronic kidney disease, stage 3 (moderate): Secondary | ICD-10-CM | POA: Diagnosis not present

## 2018-06-10 DIAGNOSIS — M069 Rheumatoid arthritis, unspecified: Secondary | ICD-10-CM | POA: Diagnosis not present

## 2018-06-13 DIAGNOSIS — G301 Alzheimer's disease with late onset: Secondary | ICD-10-CM | POA: Diagnosis not present

## 2018-06-13 DIAGNOSIS — F331 Major depressive disorder, recurrent, moderate: Secondary | ICD-10-CM | POA: Diagnosis not present

## 2018-06-13 DIAGNOSIS — F0280 Dementia in other diseases classified elsewhere without behavioral disturbance: Secondary | ICD-10-CM | POA: Diagnosis not present

## 2018-06-27 DIAGNOSIS — I10 Essential (primary) hypertension: Secondary | ICD-10-CM | POA: Diagnosis not present

## 2018-06-27 DIAGNOSIS — I4891 Unspecified atrial fibrillation: Secondary | ICD-10-CM | POA: Diagnosis not present

## 2018-06-27 DIAGNOSIS — D631 Anemia in chronic kidney disease: Secondary | ICD-10-CM | POA: Diagnosis not present

## 2018-06-27 DIAGNOSIS — E11621 Type 2 diabetes mellitus with foot ulcer: Secondary | ICD-10-CM | POA: Diagnosis not present

## 2018-06-27 DIAGNOSIS — E1122 Type 2 diabetes mellitus with diabetic chronic kidney disease: Secondary | ICD-10-CM | POA: Diagnosis not present

## 2018-06-27 DIAGNOSIS — M6281 Muscle weakness (generalized): Secondary | ICD-10-CM | POA: Diagnosis not present

## 2018-06-27 DIAGNOSIS — R278 Other lack of coordination: Secondary | ICD-10-CM | POA: Diagnosis not present

## 2018-06-27 DIAGNOSIS — F331 Major depressive disorder, recurrent, moderate: Secondary | ICD-10-CM | POA: Diagnosis not present

## 2018-06-27 DIAGNOSIS — R2681 Unsteadiness on feet: Secondary | ICD-10-CM | POA: Diagnosis not present

## 2018-06-27 DIAGNOSIS — F0280 Dementia in other diseases classified elsewhere without behavioral disturbance: Secondary | ICD-10-CM | POA: Diagnosis not present

## 2018-06-27 DIAGNOSIS — R41841 Cognitive communication deficit: Secondary | ICD-10-CM | POA: Diagnosis not present

## 2018-06-27 DIAGNOSIS — G301 Alzheimer's disease with late onset: Secondary | ICD-10-CM | POA: Diagnosis not present

## 2018-06-28 DIAGNOSIS — R278 Other lack of coordination: Secondary | ICD-10-CM | POA: Diagnosis not present

## 2018-06-28 DIAGNOSIS — R41841 Cognitive communication deficit: Secondary | ICD-10-CM | POA: Diagnosis not present

## 2018-06-28 DIAGNOSIS — E1122 Type 2 diabetes mellitus with diabetic chronic kidney disease: Secondary | ICD-10-CM | POA: Diagnosis not present

## 2018-06-28 DIAGNOSIS — I10 Essential (primary) hypertension: Secondary | ICD-10-CM | POA: Diagnosis not present

## 2018-06-28 DIAGNOSIS — E11621 Type 2 diabetes mellitus with foot ulcer: Secondary | ICD-10-CM | POA: Diagnosis not present

## 2018-06-28 DIAGNOSIS — I4891 Unspecified atrial fibrillation: Secondary | ICD-10-CM | POA: Diagnosis not present

## 2018-06-28 DIAGNOSIS — R2681 Unsteadiness on feet: Secondary | ICD-10-CM | POA: Diagnosis not present

## 2018-06-28 DIAGNOSIS — D631 Anemia in chronic kidney disease: Secondary | ICD-10-CM | POA: Diagnosis not present

## 2018-06-28 DIAGNOSIS — M6281 Muscle weakness (generalized): Secondary | ICD-10-CM | POA: Diagnosis not present

## 2018-06-29 DIAGNOSIS — M069 Rheumatoid arthritis, unspecified: Secondary | ICD-10-CM | POA: Diagnosis not present

## 2018-06-29 DIAGNOSIS — R451 Restlessness and agitation: Secondary | ICD-10-CM | POA: Diagnosis not present

## 2018-06-29 DIAGNOSIS — F419 Anxiety disorder, unspecified: Secondary | ICD-10-CM | POA: Diagnosis not present

## 2018-06-29 DIAGNOSIS — I4891 Unspecified atrial fibrillation: Secondary | ICD-10-CM | POA: Diagnosis not present

## 2018-06-29 DIAGNOSIS — R278 Other lack of coordination: Secondary | ICD-10-CM | POA: Diagnosis not present

## 2018-06-29 DIAGNOSIS — I10 Essential (primary) hypertension: Secondary | ICD-10-CM | POA: Diagnosis not present

## 2018-06-29 DIAGNOSIS — E1122 Type 2 diabetes mellitus with diabetic chronic kidney disease: Secondary | ICD-10-CM | POA: Diagnosis not present

## 2018-06-29 DIAGNOSIS — R2681 Unsteadiness on feet: Secondary | ICD-10-CM | POA: Diagnosis not present

## 2018-06-29 DIAGNOSIS — M6281 Muscle weakness (generalized): Secondary | ICD-10-CM | POA: Diagnosis not present

## 2018-06-29 DIAGNOSIS — D631 Anemia in chronic kidney disease: Secondary | ICD-10-CM | POA: Diagnosis not present

## 2018-06-29 DIAGNOSIS — E11621 Type 2 diabetes mellitus with foot ulcer: Secondary | ICD-10-CM | POA: Diagnosis not present

## 2018-06-29 DIAGNOSIS — F329 Major depressive disorder, single episode, unspecified: Secondary | ICD-10-CM | POA: Diagnosis not present

## 2018-06-29 DIAGNOSIS — R41841 Cognitive communication deficit: Secondary | ICD-10-CM | POA: Diagnosis not present

## 2018-07-02 DIAGNOSIS — E1122 Type 2 diabetes mellitus with diabetic chronic kidney disease: Secondary | ICD-10-CM | POA: Diagnosis not present

## 2018-07-02 DIAGNOSIS — R2681 Unsteadiness on feet: Secondary | ICD-10-CM | POA: Diagnosis not present

## 2018-07-02 DIAGNOSIS — D631 Anemia in chronic kidney disease: Secondary | ICD-10-CM | POA: Diagnosis not present

## 2018-07-02 DIAGNOSIS — M6281 Muscle weakness (generalized): Secondary | ICD-10-CM | POA: Diagnosis not present

## 2018-07-02 DIAGNOSIS — I4891 Unspecified atrial fibrillation: Secondary | ICD-10-CM | POA: Diagnosis not present

## 2018-07-02 DIAGNOSIS — I10 Essential (primary) hypertension: Secondary | ICD-10-CM | POA: Diagnosis not present

## 2018-07-02 DIAGNOSIS — R41841 Cognitive communication deficit: Secondary | ICD-10-CM | POA: Diagnosis not present

## 2018-07-02 DIAGNOSIS — E11621 Type 2 diabetes mellitus with foot ulcer: Secondary | ICD-10-CM | POA: Diagnosis not present

## 2018-07-02 DIAGNOSIS — R278 Other lack of coordination: Secondary | ICD-10-CM | POA: Diagnosis not present

## 2018-07-03 DIAGNOSIS — D631 Anemia in chronic kidney disease: Secondary | ICD-10-CM | POA: Diagnosis not present

## 2018-07-03 DIAGNOSIS — E1122 Type 2 diabetes mellitus with diabetic chronic kidney disease: Secondary | ICD-10-CM | POA: Diagnosis not present

## 2018-07-03 DIAGNOSIS — R41841 Cognitive communication deficit: Secondary | ICD-10-CM | POA: Diagnosis not present

## 2018-07-03 DIAGNOSIS — I4891 Unspecified atrial fibrillation: Secondary | ICD-10-CM | POA: Diagnosis not present

## 2018-07-03 DIAGNOSIS — E11621 Type 2 diabetes mellitus with foot ulcer: Secondary | ICD-10-CM | POA: Diagnosis not present

## 2018-07-03 DIAGNOSIS — R278 Other lack of coordination: Secondary | ICD-10-CM | POA: Diagnosis not present

## 2018-07-03 DIAGNOSIS — R2681 Unsteadiness on feet: Secondary | ICD-10-CM | POA: Diagnosis not present

## 2018-07-03 DIAGNOSIS — I10 Essential (primary) hypertension: Secondary | ICD-10-CM | POA: Diagnosis not present

## 2018-07-03 DIAGNOSIS — M6281 Muscle weakness (generalized): Secondary | ICD-10-CM | POA: Diagnosis not present

## 2018-07-04 DIAGNOSIS — G301 Alzheimer's disease with late onset: Secondary | ICD-10-CM | POA: Diagnosis not present

## 2018-07-04 DIAGNOSIS — M6281 Muscle weakness (generalized): Secondary | ICD-10-CM | POA: Diagnosis not present

## 2018-07-04 DIAGNOSIS — R41841 Cognitive communication deficit: Secondary | ICD-10-CM | POA: Diagnosis not present

## 2018-07-04 DIAGNOSIS — F331 Major depressive disorder, recurrent, moderate: Secondary | ICD-10-CM | POA: Diagnosis not present

## 2018-07-04 DIAGNOSIS — E11621 Type 2 diabetes mellitus with foot ulcer: Secondary | ICD-10-CM | POA: Diagnosis not present

## 2018-07-04 DIAGNOSIS — I10 Essential (primary) hypertension: Secondary | ICD-10-CM | POA: Diagnosis not present

## 2018-07-04 DIAGNOSIS — R2681 Unsteadiness on feet: Secondary | ICD-10-CM | POA: Diagnosis not present

## 2018-07-04 DIAGNOSIS — D631 Anemia in chronic kidney disease: Secondary | ICD-10-CM | POA: Diagnosis not present

## 2018-07-04 DIAGNOSIS — I4891 Unspecified atrial fibrillation: Secondary | ICD-10-CM | POA: Diagnosis not present

## 2018-07-04 DIAGNOSIS — F0280 Dementia in other diseases classified elsewhere without behavioral disturbance: Secondary | ICD-10-CM | POA: Diagnosis not present

## 2018-07-04 DIAGNOSIS — E1122 Type 2 diabetes mellitus with diabetic chronic kidney disease: Secondary | ICD-10-CM | POA: Diagnosis not present

## 2018-07-04 DIAGNOSIS — R278 Other lack of coordination: Secondary | ICD-10-CM | POA: Diagnosis not present

## 2018-07-05 DIAGNOSIS — E11621 Type 2 diabetes mellitus with foot ulcer: Secondary | ICD-10-CM | POA: Diagnosis not present

## 2018-07-05 DIAGNOSIS — I4891 Unspecified atrial fibrillation: Secondary | ICD-10-CM | POA: Diagnosis not present

## 2018-07-05 DIAGNOSIS — R278 Other lack of coordination: Secondary | ICD-10-CM | POA: Diagnosis not present

## 2018-07-05 DIAGNOSIS — R41841 Cognitive communication deficit: Secondary | ICD-10-CM | POA: Diagnosis not present

## 2018-07-05 DIAGNOSIS — I10 Essential (primary) hypertension: Secondary | ICD-10-CM | POA: Diagnosis not present

## 2018-07-05 DIAGNOSIS — R2681 Unsteadiness on feet: Secondary | ICD-10-CM | POA: Diagnosis not present

## 2018-07-05 DIAGNOSIS — E1122 Type 2 diabetes mellitus with diabetic chronic kidney disease: Secondary | ICD-10-CM | POA: Diagnosis not present

## 2018-07-05 DIAGNOSIS — M6281 Muscle weakness (generalized): Secondary | ICD-10-CM | POA: Diagnosis not present

## 2018-07-05 DIAGNOSIS — D631 Anemia in chronic kidney disease: Secondary | ICD-10-CM | POA: Diagnosis not present

## 2018-07-06 DIAGNOSIS — I4891 Unspecified atrial fibrillation: Secondary | ICD-10-CM | POA: Diagnosis not present

## 2018-07-06 DIAGNOSIS — E11621 Type 2 diabetes mellitus with foot ulcer: Secondary | ICD-10-CM | POA: Diagnosis not present

## 2018-07-06 DIAGNOSIS — R41841 Cognitive communication deficit: Secondary | ICD-10-CM | POA: Diagnosis not present

## 2018-07-06 DIAGNOSIS — M6281 Muscle weakness (generalized): Secondary | ICD-10-CM | POA: Diagnosis not present

## 2018-07-06 DIAGNOSIS — R278 Other lack of coordination: Secondary | ICD-10-CM | POA: Diagnosis not present

## 2018-07-06 DIAGNOSIS — I10 Essential (primary) hypertension: Secondary | ICD-10-CM | POA: Diagnosis not present

## 2018-07-06 DIAGNOSIS — R2681 Unsteadiness on feet: Secondary | ICD-10-CM | POA: Diagnosis not present

## 2018-07-06 DIAGNOSIS — D631 Anemia in chronic kidney disease: Secondary | ICD-10-CM | POA: Diagnosis not present

## 2018-07-06 DIAGNOSIS — E1122 Type 2 diabetes mellitus with diabetic chronic kidney disease: Secondary | ICD-10-CM | POA: Diagnosis not present

## 2018-07-09 DIAGNOSIS — I4891 Unspecified atrial fibrillation: Secondary | ICD-10-CM | POA: Diagnosis not present

## 2018-07-09 DIAGNOSIS — I10 Essential (primary) hypertension: Secondary | ICD-10-CM | POA: Diagnosis not present

## 2018-07-09 DIAGNOSIS — R278 Other lack of coordination: Secondary | ICD-10-CM | POA: Diagnosis not present

## 2018-07-09 DIAGNOSIS — E11621 Type 2 diabetes mellitus with foot ulcer: Secondary | ICD-10-CM | POA: Diagnosis not present

## 2018-07-09 DIAGNOSIS — E1122 Type 2 diabetes mellitus with diabetic chronic kidney disease: Secondary | ICD-10-CM | POA: Diagnosis not present

## 2018-07-09 DIAGNOSIS — R41841 Cognitive communication deficit: Secondary | ICD-10-CM | POA: Diagnosis not present

## 2018-07-09 DIAGNOSIS — R2681 Unsteadiness on feet: Secondary | ICD-10-CM | POA: Diagnosis not present

## 2018-07-09 DIAGNOSIS — M6281 Muscle weakness (generalized): Secondary | ICD-10-CM | POA: Diagnosis not present

## 2018-07-09 DIAGNOSIS — D631 Anemia in chronic kidney disease: Secondary | ICD-10-CM | POA: Diagnosis not present

## 2018-07-10 DIAGNOSIS — E1122 Type 2 diabetes mellitus with diabetic chronic kidney disease: Secondary | ICD-10-CM | POA: Diagnosis not present

## 2018-07-10 DIAGNOSIS — I10 Essential (primary) hypertension: Secondary | ICD-10-CM | POA: Diagnosis not present

## 2018-07-10 DIAGNOSIS — M6281 Muscle weakness (generalized): Secondary | ICD-10-CM | POA: Diagnosis not present

## 2018-07-10 DIAGNOSIS — D631 Anemia in chronic kidney disease: Secondary | ICD-10-CM | POA: Diagnosis not present

## 2018-07-10 DIAGNOSIS — I4891 Unspecified atrial fibrillation: Secondary | ICD-10-CM | POA: Diagnosis not present

## 2018-07-10 DIAGNOSIS — E11621 Type 2 diabetes mellitus with foot ulcer: Secondary | ICD-10-CM | POA: Diagnosis not present

## 2018-07-10 DIAGNOSIS — R2681 Unsteadiness on feet: Secondary | ICD-10-CM | POA: Diagnosis not present

## 2018-07-10 DIAGNOSIS — R278 Other lack of coordination: Secondary | ICD-10-CM | POA: Diagnosis not present

## 2018-07-10 DIAGNOSIS — R41841 Cognitive communication deficit: Secondary | ICD-10-CM | POA: Diagnosis not present

## 2018-07-11 DIAGNOSIS — M6281 Muscle weakness (generalized): Secondary | ICD-10-CM | POA: Diagnosis not present

## 2018-07-11 DIAGNOSIS — R2681 Unsteadiness on feet: Secondary | ICD-10-CM | POA: Diagnosis not present

## 2018-07-11 DIAGNOSIS — R41841 Cognitive communication deficit: Secondary | ICD-10-CM | POA: Diagnosis not present

## 2018-07-11 DIAGNOSIS — D631 Anemia in chronic kidney disease: Secondary | ICD-10-CM | POA: Diagnosis not present

## 2018-07-11 DIAGNOSIS — R278 Other lack of coordination: Secondary | ICD-10-CM | POA: Diagnosis not present

## 2018-07-11 DIAGNOSIS — I4891 Unspecified atrial fibrillation: Secondary | ICD-10-CM | POA: Diagnosis not present

## 2018-07-11 DIAGNOSIS — I10 Essential (primary) hypertension: Secondary | ICD-10-CM | POA: Diagnosis not present

## 2018-07-11 DIAGNOSIS — E11621 Type 2 diabetes mellitus with foot ulcer: Secondary | ICD-10-CM | POA: Diagnosis not present

## 2018-07-11 DIAGNOSIS — E1122 Type 2 diabetes mellitus with diabetic chronic kidney disease: Secondary | ICD-10-CM | POA: Diagnosis not present

## 2018-07-12 DIAGNOSIS — R2681 Unsteadiness on feet: Secondary | ICD-10-CM | POA: Diagnosis not present

## 2018-07-12 DIAGNOSIS — E11621 Type 2 diabetes mellitus with foot ulcer: Secondary | ICD-10-CM | POA: Diagnosis not present

## 2018-07-12 DIAGNOSIS — I4891 Unspecified atrial fibrillation: Secondary | ICD-10-CM | POA: Diagnosis not present

## 2018-07-12 DIAGNOSIS — M6281 Muscle weakness (generalized): Secondary | ICD-10-CM | POA: Diagnosis not present

## 2018-07-12 DIAGNOSIS — D631 Anemia in chronic kidney disease: Secondary | ICD-10-CM | POA: Diagnosis not present

## 2018-07-12 DIAGNOSIS — R278 Other lack of coordination: Secondary | ICD-10-CM | POA: Diagnosis not present

## 2018-07-12 DIAGNOSIS — I10 Essential (primary) hypertension: Secondary | ICD-10-CM | POA: Diagnosis not present

## 2018-07-12 DIAGNOSIS — E1122 Type 2 diabetes mellitus with diabetic chronic kidney disease: Secondary | ICD-10-CM | POA: Diagnosis not present

## 2018-07-12 DIAGNOSIS — R41841 Cognitive communication deficit: Secondary | ICD-10-CM | POA: Diagnosis not present

## 2018-07-13 DIAGNOSIS — M6281 Muscle weakness (generalized): Secondary | ICD-10-CM | POA: Diagnosis not present

## 2018-07-13 DIAGNOSIS — R2681 Unsteadiness on feet: Secondary | ICD-10-CM | POA: Diagnosis not present

## 2018-07-13 DIAGNOSIS — E11621 Type 2 diabetes mellitus with foot ulcer: Secondary | ICD-10-CM | POA: Diagnosis not present

## 2018-07-13 DIAGNOSIS — I4891 Unspecified atrial fibrillation: Secondary | ICD-10-CM | POA: Diagnosis not present

## 2018-07-13 DIAGNOSIS — I10 Essential (primary) hypertension: Secondary | ICD-10-CM | POA: Diagnosis not present

## 2018-07-13 DIAGNOSIS — E1122 Type 2 diabetes mellitus with diabetic chronic kidney disease: Secondary | ICD-10-CM | POA: Diagnosis not present

## 2018-07-13 DIAGNOSIS — R278 Other lack of coordination: Secondary | ICD-10-CM | POA: Diagnosis not present

## 2018-07-13 DIAGNOSIS — D631 Anemia in chronic kidney disease: Secondary | ICD-10-CM | POA: Diagnosis not present

## 2018-07-13 DIAGNOSIS — R41841 Cognitive communication deficit: Secondary | ICD-10-CM | POA: Diagnosis not present

## 2018-07-16 DIAGNOSIS — I4891 Unspecified atrial fibrillation: Secondary | ICD-10-CM | POA: Diagnosis not present

## 2018-07-16 DIAGNOSIS — D631 Anemia in chronic kidney disease: Secondary | ICD-10-CM | POA: Diagnosis not present

## 2018-07-16 DIAGNOSIS — R278 Other lack of coordination: Secondary | ICD-10-CM | POA: Diagnosis not present

## 2018-07-16 DIAGNOSIS — E11621 Type 2 diabetes mellitus with foot ulcer: Secondary | ICD-10-CM | POA: Diagnosis not present

## 2018-07-16 DIAGNOSIS — E1122 Type 2 diabetes mellitus with diabetic chronic kidney disease: Secondary | ICD-10-CM | POA: Diagnosis not present

## 2018-07-16 DIAGNOSIS — I10 Essential (primary) hypertension: Secondary | ICD-10-CM | POA: Diagnosis not present

## 2018-07-16 DIAGNOSIS — M6281 Muscle weakness (generalized): Secondary | ICD-10-CM | POA: Diagnosis not present

## 2018-07-16 DIAGNOSIS — R2681 Unsteadiness on feet: Secondary | ICD-10-CM | POA: Diagnosis not present

## 2018-07-16 DIAGNOSIS — R41841 Cognitive communication deficit: Secondary | ICD-10-CM | POA: Diagnosis not present

## 2018-07-17 DIAGNOSIS — R2681 Unsteadiness on feet: Secondary | ICD-10-CM | POA: Diagnosis not present

## 2018-07-17 DIAGNOSIS — I10 Essential (primary) hypertension: Secondary | ICD-10-CM | POA: Diagnosis not present

## 2018-07-17 DIAGNOSIS — E1122 Type 2 diabetes mellitus with diabetic chronic kidney disease: Secondary | ICD-10-CM | POA: Diagnosis not present

## 2018-07-17 DIAGNOSIS — D631 Anemia in chronic kidney disease: Secondary | ICD-10-CM | POA: Diagnosis not present

## 2018-07-17 DIAGNOSIS — I4891 Unspecified atrial fibrillation: Secondary | ICD-10-CM | POA: Diagnosis not present

## 2018-07-17 DIAGNOSIS — M6281 Muscle weakness (generalized): Secondary | ICD-10-CM | POA: Diagnosis not present

## 2018-07-17 DIAGNOSIS — E11621 Type 2 diabetes mellitus with foot ulcer: Secondary | ICD-10-CM | POA: Diagnosis not present

## 2018-07-17 DIAGNOSIS — R41841 Cognitive communication deficit: Secondary | ICD-10-CM | POA: Diagnosis not present

## 2018-07-17 DIAGNOSIS — R278 Other lack of coordination: Secondary | ICD-10-CM | POA: Diagnosis not present

## 2018-07-18 DIAGNOSIS — D631 Anemia in chronic kidney disease: Secondary | ICD-10-CM | POA: Diagnosis not present

## 2018-07-18 DIAGNOSIS — R278 Other lack of coordination: Secondary | ICD-10-CM | POA: Diagnosis not present

## 2018-07-18 DIAGNOSIS — R2681 Unsteadiness on feet: Secondary | ICD-10-CM | POA: Diagnosis not present

## 2018-07-18 DIAGNOSIS — E1122 Type 2 diabetes mellitus with diabetic chronic kidney disease: Secondary | ICD-10-CM | POA: Diagnosis not present

## 2018-07-18 DIAGNOSIS — R41841 Cognitive communication deficit: Secondary | ICD-10-CM | POA: Diagnosis not present

## 2018-07-18 DIAGNOSIS — E11621 Type 2 diabetes mellitus with foot ulcer: Secondary | ICD-10-CM | POA: Diagnosis not present

## 2018-07-18 DIAGNOSIS — M6281 Muscle weakness (generalized): Secondary | ICD-10-CM | POA: Diagnosis not present

## 2018-07-18 DIAGNOSIS — I10 Essential (primary) hypertension: Secondary | ICD-10-CM | POA: Diagnosis not present

## 2018-07-18 DIAGNOSIS — I4891 Unspecified atrial fibrillation: Secondary | ICD-10-CM | POA: Diagnosis not present

## 2018-07-19 DIAGNOSIS — I4891 Unspecified atrial fibrillation: Secondary | ICD-10-CM | POA: Diagnosis not present

## 2018-07-19 DIAGNOSIS — R278 Other lack of coordination: Secondary | ICD-10-CM | POA: Diagnosis not present

## 2018-07-19 DIAGNOSIS — R4182 Altered mental status, unspecified: Secondary | ICD-10-CM | POA: Diagnosis not present

## 2018-07-19 DIAGNOSIS — E1122 Type 2 diabetes mellitus with diabetic chronic kidney disease: Secondary | ICD-10-CM | POA: Diagnosis not present

## 2018-07-19 DIAGNOSIS — N183 Chronic kidney disease, stage 3 (moderate): Secondary | ICD-10-CM | POA: Diagnosis not present

## 2018-07-19 DIAGNOSIS — I10 Essential (primary) hypertension: Secondary | ICD-10-CM | POA: Diagnosis not present

## 2018-07-19 DIAGNOSIS — N178 Other acute kidney failure: Secondary | ICD-10-CM | POA: Diagnosis not present

## 2018-07-19 DIAGNOSIS — E11621 Type 2 diabetes mellitus with foot ulcer: Secondary | ICD-10-CM | POA: Diagnosis not present

## 2018-07-19 DIAGNOSIS — R41841 Cognitive communication deficit: Secondary | ICD-10-CM | POA: Diagnosis not present

## 2018-07-19 DIAGNOSIS — M6281 Muscle weakness (generalized): Secondary | ICD-10-CM | POA: Diagnosis not present

## 2018-07-19 DIAGNOSIS — I5032 Chronic diastolic (congestive) heart failure: Secondary | ICD-10-CM | POA: Diagnosis not present

## 2018-07-19 DIAGNOSIS — R2681 Unsteadiness on feet: Secondary | ICD-10-CM | POA: Diagnosis not present

## 2018-07-19 DIAGNOSIS — D631 Anemia in chronic kidney disease: Secondary | ICD-10-CM | POA: Diagnosis not present

## 2018-07-20 DIAGNOSIS — R41841 Cognitive communication deficit: Secondary | ICD-10-CM | POA: Diagnosis not present

## 2018-07-20 DIAGNOSIS — N390 Urinary tract infection, site not specified: Secondary | ICD-10-CM | POA: Diagnosis not present

## 2018-07-20 DIAGNOSIS — R2681 Unsteadiness on feet: Secondary | ICD-10-CM | POA: Diagnosis not present

## 2018-07-20 DIAGNOSIS — R278 Other lack of coordination: Secondary | ICD-10-CM | POA: Diagnosis not present

## 2018-07-20 DIAGNOSIS — I4891 Unspecified atrial fibrillation: Secondary | ICD-10-CM | POA: Diagnosis not present

## 2018-07-20 DIAGNOSIS — M6281 Muscle weakness (generalized): Secondary | ICD-10-CM | POA: Diagnosis not present

## 2018-07-20 DIAGNOSIS — I10 Essential (primary) hypertension: Secondary | ICD-10-CM | POA: Diagnosis not present

## 2018-07-20 DIAGNOSIS — D631 Anemia in chronic kidney disease: Secondary | ICD-10-CM | POA: Diagnosis not present

## 2018-07-20 DIAGNOSIS — E1122 Type 2 diabetes mellitus with diabetic chronic kidney disease: Secondary | ICD-10-CM | POA: Diagnosis not present

## 2018-07-20 DIAGNOSIS — R319 Hematuria, unspecified: Secondary | ICD-10-CM | POA: Diagnosis not present

## 2018-07-20 DIAGNOSIS — E11621 Type 2 diabetes mellitus with foot ulcer: Secondary | ICD-10-CM | POA: Diagnosis not present

## 2018-08-30 DIAGNOSIS — R278 Other lack of coordination: Secondary | ICD-10-CM | POA: Diagnosis not present

## 2018-08-30 DIAGNOSIS — M6281 Muscle weakness (generalized): Secondary | ICD-10-CM | POA: Diagnosis not present

## 2018-08-30 DIAGNOSIS — R41841 Cognitive communication deficit: Secondary | ICD-10-CM | POA: Diagnosis not present

## 2018-08-30 DIAGNOSIS — D631 Anemia in chronic kidney disease: Secondary | ICD-10-CM | POA: Diagnosis not present

## 2018-08-30 DIAGNOSIS — E11621 Type 2 diabetes mellitus with foot ulcer: Secondary | ICD-10-CM | POA: Diagnosis not present

## 2018-08-30 DIAGNOSIS — E1122 Type 2 diabetes mellitus with diabetic chronic kidney disease: Secondary | ICD-10-CM | POA: Diagnosis not present

## 2018-08-30 DIAGNOSIS — R2681 Unsteadiness on feet: Secondary | ICD-10-CM | POA: Diagnosis not present

## 2018-08-30 DIAGNOSIS — I4891 Unspecified atrial fibrillation: Secondary | ICD-10-CM | POA: Diagnosis not present

## 2018-08-30 DIAGNOSIS — I10 Essential (primary) hypertension: Secondary | ICD-10-CM | POA: Diagnosis not present

## 2018-08-31 DIAGNOSIS — E11621 Type 2 diabetes mellitus with foot ulcer: Secondary | ICD-10-CM | POA: Diagnosis not present

## 2018-08-31 DIAGNOSIS — R41841 Cognitive communication deficit: Secondary | ICD-10-CM | POA: Diagnosis not present

## 2018-08-31 DIAGNOSIS — E1122 Type 2 diabetes mellitus with diabetic chronic kidney disease: Secondary | ICD-10-CM | POA: Diagnosis not present

## 2018-08-31 DIAGNOSIS — I10 Essential (primary) hypertension: Secondary | ICD-10-CM | POA: Diagnosis not present

## 2018-08-31 DIAGNOSIS — R278 Other lack of coordination: Secondary | ICD-10-CM | POA: Diagnosis not present

## 2018-08-31 DIAGNOSIS — D631 Anemia in chronic kidney disease: Secondary | ICD-10-CM | POA: Diagnosis not present

## 2018-08-31 DIAGNOSIS — I4891 Unspecified atrial fibrillation: Secondary | ICD-10-CM | POA: Diagnosis not present

## 2018-08-31 DIAGNOSIS — R2681 Unsteadiness on feet: Secondary | ICD-10-CM | POA: Diagnosis not present

## 2018-08-31 DIAGNOSIS — M6281 Muscle weakness (generalized): Secondary | ICD-10-CM | POA: Diagnosis not present

## 2018-09-03 DIAGNOSIS — M6281 Muscle weakness (generalized): Secondary | ICD-10-CM | POA: Diagnosis not present

## 2018-09-03 DIAGNOSIS — D631 Anemia in chronic kidney disease: Secondary | ICD-10-CM | POA: Diagnosis not present

## 2018-09-03 DIAGNOSIS — R41841 Cognitive communication deficit: Secondary | ICD-10-CM | POA: Diagnosis not present

## 2018-09-03 DIAGNOSIS — I10 Essential (primary) hypertension: Secondary | ICD-10-CM | POA: Diagnosis not present

## 2018-09-03 DIAGNOSIS — I4891 Unspecified atrial fibrillation: Secondary | ICD-10-CM | POA: Diagnosis not present

## 2018-09-03 DIAGNOSIS — E1122 Type 2 diabetes mellitus with diabetic chronic kidney disease: Secondary | ICD-10-CM | POA: Diagnosis not present

## 2018-09-03 DIAGNOSIS — R2681 Unsteadiness on feet: Secondary | ICD-10-CM | POA: Diagnosis not present

## 2018-09-03 DIAGNOSIS — E11621 Type 2 diabetes mellitus with foot ulcer: Secondary | ICD-10-CM | POA: Diagnosis not present

## 2018-09-03 DIAGNOSIS — R278 Other lack of coordination: Secondary | ICD-10-CM | POA: Diagnosis not present

## 2018-09-04 DIAGNOSIS — R2681 Unsteadiness on feet: Secondary | ICD-10-CM | POA: Diagnosis not present

## 2018-09-04 DIAGNOSIS — E1122 Type 2 diabetes mellitus with diabetic chronic kidney disease: Secondary | ICD-10-CM | POA: Diagnosis not present

## 2018-09-04 DIAGNOSIS — R41841 Cognitive communication deficit: Secondary | ICD-10-CM | POA: Diagnosis not present

## 2018-09-04 DIAGNOSIS — R278 Other lack of coordination: Secondary | ICD-10-CM | POA: Diagnosis not present

## 2018-09-04 DIAGNOSIS — D631 Anemia in chronic kidney disease: Secondary | ICD-10-CM | POA: Diagnosis not present

## 2018-09-04 DIAGNOSIS — I10 Essential (primary) hypertension: Secondary | ICD-10-CM | POA: Diagnosis not present

## 2018-09-04 DIAGNOSIS — M6281 Muscle weakness (generalized): Secondary | ICD-10-CM | POA: Diagnosis not present

## 2018-09-04 DIAGNOSIS — I4891 Unspecified atrial fibrillation: Secondary | ICD-10-CM | POA: Diagnosis not present

## 2018-09-04 DIAGNOSIS — E11621 Type 2 diabetes mellitus with foot ulcer: Secondary | ICD-10-CM | POA: Diagnosis not present

## 2018-09-05 DIAGNOSIS — E1122 Type 2 diabetes mellitus with diabetic chronic kidney disease: Secondary | ICD-10-CM | POA: Diagnosis not present

## 2018-09-05 DIAGNOSIS — E11621 Type 2 diabetes mellitus with foot ulcer: Secondary | ICD-10-CM | POA: Diagnosis not present

## 2018-09-05 DIAGNOSIS — I4891 Unspecified atrial fibrillation: Secondary | ICD-10-CM | POA: Diagnosis not present

## 2018-09-05 DIAGNOSIS — R41841 Cognitive communication deficit: Secondary | ICD-10-CM | POA: Diagnosis not present

## 2018-09-05 DIAGNOSIS — R278 Other lack of coordination: Secondary | ICD-10-CM | POA: Diagnosis not present

## 2018-09-05 DIAGNOSIS — D631 Anemia in chronic kidney disease: Secondary | ICD-10-CM | POA: Diagnosis not present

## 2018-09-05 DIAGNOSIS — I10 Essential (primary) hypertension: Secondary | ICD-10-CM | POA: Diagnosis not present

## 2018-09-05 DIAGNOSIS — R2681 Unsteadiness on feet: Secondary | ICD-10-CM | POA: Diagnosis not present

## 2018-09-05 DIAGNOSIS — M6281 Muscle weakness (generalized): Secondary | ICD-10-CM | POA: Diagnosis not present

## 2018-09-06 DIAGNOSIS — I10 Essential (primary) hypertension: Secondary | ICD-10-CM | POA: Diagnosis not present

## 2018-09-06 DIAGNOSIS — M6281 Muscle weakness (generalized): Secondary | ICD-10-CM | POA: Diagnosis not present

## 2018-09-06 DIAGNOSIS — R278 Other lack of coordination: Secondary | ICD-10-CM | POA: Diagnosis not present

## 2018-09-06 DIAGNOSIS — D631 Anemia in chronic kidney disease: Secondary | ICD-10-CM | POA: Diagnosis not present

## 2018-09-06 DIAGNOSIS — E1122 Type 2 diabetes mellitus with diabetic chronic kidney disease: Secondary | ICD-10-CM | POA: Diagnosis not present

## 2018-09-06 DIAGNOSIS — R2681 Unsteadiness on feet: Secondary | ICD-10-CM | POA: Diagnosis not present

## 2018-09-06 DIAGNOSIS — R41841 Cognitive communication deficit: Secondary | ICD-10-CM | POA: Diagnosis not present

## 2018-09-06 DIAGNOSIS — E11621 Type 2 diabetes mellitus with foot ulcer: Secondary | ICD-10-CM | POA: Diagnosis not present

## 2018-09-06 DIAGNOSIS — I4891 Unspecified atrial fibrillation: Secondary | ICD-10-CM | POA: Diagnosis not present

## 2018-09-07 DIAGNOSIS — R2681 Unsteadiness on feet: Secondary | ICD-10-CM | POA: Diagnosis not present

## 2018-09-07 DIAGNOSIS — D631 Anemia in chronic kidney disease: Secondary | ICD-10-CM | POA: Diagnosis not present

## 2018-09-07 DIAGNOSIS — M6281 Muscle weakness (generalized): Secondary | ICD-10-CM | POA: Diagnosis not present

## 2018-09-07 DIAGNOSIS — R41841 Cognitive communication deficit: Secondary | ICD-10-CM | POA: Diagnosis not present

## 2018-09-07 DIAGNOSIS — I4891 Unspecified atrial fibrillation: Secondary | ICD-10-CM | POA: Diagnosis not present

## 2018-09-07 DIAGNOSIS — E11621 Type 2 diabetes mellitus with foot ulcer: Secondary | ICD-10-CM | POA: Diagnosis not present

## 2018-09-07 DIAGNOSIS — R278 Other lack of coordination: Secondary | ICD-10-CM | POA: Diagnosis not present

## 2018-09-07 DIAGNOSIS — I10 Essential (primary) hypertension: Secondary | ICD-10-CM | POA: Diagnosis not present

## 2018-09-07 DIAGNOSIS — E1122 Type 2 diabetes mellitus with diabetic chronic kidney disease: Secondary | ICD-10-CM | POA: Diagnosis not present

## 2018-09-10 DIAGNOSIS — I10 Essential (primary) hypertension: Secondary | ICD-10-CM | POA: Diagnosis not present

## 2018-09-10 DIAGNOSIS — E11621 Type 2 diabetes mellitus with foot ulcer: Secondary | ICD-10-CM | POA: Diagnosis not present

## 2018-09-10 DIAGNOSIS — R41841 Cognitive communication deficit: Secondary | ICD-10-CM | POA: Diagnosis not present

## 2018-09-10 DIAGNOSIS — R278 Other lack of coordination: Secondary | ICD-10-CM | POA: Diagnosis not present

## 2018-09-10 DIAGNOSIS — R2681 Unsteadiness on feet: Secondary | ICD-10-CM | POA: Diagnosis not present

## 2018-09-10 DIAGNOSIS — E1122 Type 2 diabetes mellitus with diabetic chronic kidney disease: Secondary | ICD-10-CM | POA: Diagnosis not present

## 2018-09-10 DIAGNOSIS — M6281 Muscle weakness (generalized): Secondary | ICD-10-CM | POA: Diagnosis not present

## 2018-09-10 DIAGNOSIS — D631 Anemia in chronic kidney disease: Secondary | ICD-10-CM | POA: Diagnosis not present

## 2018-09-10 DIAGNOSIS — I4891 Unspecified atrial fibrillation: Secondary | ICD-10-CM | POA: Diagnosis not present

## 2018-09-11 DIAGNOSIS — M6281 Muscle weakness (generalized): Secondary | ICD-10-CM | POA: Diagnosis not present

## 2018-09-11 DIAGNOSIS — R2681 Unsteadiness on feet: Secondary | ICD-10-CM | POA: Diagnosis not present

## 2018-09-11 DIAGNOSIS — I10 Essential (primary) hypertension: Secondary | ICD-10-CM | POA: Diagnosis not present

## 2018-09-11 DIAGNOSIS — E1122 Type 2 diabetes mellitus with diabetic chronic kidney disease: Secondary | ICD-10-CM | POA: Diagnosis not present

## 2018-09-11 DIAGNOSIS — R41841 Cognitive communication deficit: Secondary | ICD-10-CM | POA: Diagnosis not present

## 2018-09-11 DIAGNOSIS — R278 Other lack of coordination: Secondary | ICD-10-CM | POA: Diagnosis not present

## 2018-09-11 DIAGNOSIS — D631 Anemia in chronic kidney disease: Secondary | ICD-10-CM | POA: Diagnosis not present

## 2018-09-11 DIAGNOSIS — E11621 Type 2 diabetes mellitus with foot ulcer: Secondary | ICD-10-CM | POA: Diagnosis not present

## 2018-09-11 DIAGNOSIS — I4891 Unspecified atrial fibrillation: Secondary | ICD-10-CM | POA: Diagnosis not present

## 2018-09-12 DIAGNOSIS — M6281 Muscle weakness (generalized): Secondary | ICD-10-CM | POA: Diagnosis not present

## 2018-09-12 DIAGNOSIS — I4891 Unspecified atrial fibrillation: Secondary | ICD-10-CM | POA: Diagnosis not present

## 2018-09-12 DIAGNOSIS — R41841 Cognitive communication deficit: Secondary | ICD-10-CM | POA: Diagnosis not present

## 2018-09-12 DIAGNOSIS — R2681 Unsteadiness on feet: Secondary | ICD-10-CM | POA: Diagnosis not present

## 2018-09-12 DIAGNOSIS — D631 Anemia in chronic kidney disease: Secondary | ICD-10-CM | POA: Diagnosis not present

## 2018-09-12 DIAGNOSIS — I10 Essential (primary) hypertension: Secondary | ICD-10-CM | POA: Diagnosis not present

## 2018-09-12 DIAGNOSIS — E1122 Type 2 diabetes mellitus with diabetic chronic kidney disease: Secondary | ICD-10-CM | POA: Diagnosis not present

## 2018-09-12 DIAGNOSIS — R278 Other lack of coordination: Secondary | ICD-10-CM | POA: Diagnosis not present

## 2018-09-12 DIAGNOSIS — E11621 Type 2 diabetes mellitus with foot ulcer: Secondary | ICD-10-CM | POA: Diagnosis not present

## 2018-09-13 DIAGNOSIS — D631 Anemia in chronic kidney disease: Secondary | ICD-10-CM | POA: Diagnosis not present

## 2018-09-13 DIAGNOSIS — W19XXXD Unspecified fall, subsequent encounter: Secondary | ICD-10-CM | POA: Diagnosis not present

## 2018-09-13 DIAGNOSIS — M6281 Muscle weakness (generalized): Secondary | ICD-10-CM | POA: Diagnosis not present

## 2018-09-13 DIAGNOSIS — I5032 Chronic diastolic (congestive) heart failure: Secondary | ICD-10-CM | POA: Diagnosis not present

## 2018-09-13 DIAGNOSIS — I4821 Permanent atrial fibrillation: Secondary | ICD-10-CM | POA: Diagnosis not present

## 2018-09-13 DIAGNOSIS — M069 Rheumatoid arthritis, unspecified: Secondary | ICD-10-CM | POA: Diagnosis not present

## 2018-09-13 DIAGNOSIS — I10 Essential (primary) hypertension: Secondary | ICD-10-CM | POA: Diagnosis not present

## 2018-09-13 DIAGNOSIS — R41841 Cognitive communication deficit: Secondary | ICD-10-CM | POA: Diagnosis not present

## 2018-09-13 DIAGNOSIS — R278 Other lack of coordination: Secondary | ICD-10-CM | POA: Diagnosis not present

## 2018-09-13 DIAGNOSIS — E1122 Type 2 diabetes mellitus with diabetic chronic kidney disease: Secondary | ICD-10-CM | POA: Diagnosis not present

## 2018-09-13 DIAGNOSIS — R2681 Unsteadiness on feet: Secondary | ICD-10-CM | POA: Diagnosis not present

## 2018-09-13 DIAGNOSIS — I4891 Unspecified atrial fibrillation: Secondary | ICD-10-CM | POA: Diagnosis not present

## 2018-09-13 DIAGNOSIS — E11621 Type 2 diabetes mellitus with foot ulcer: Secondary | ICD-10-CM | POA: Diagnosis not present

## 2018-09-14 DIAGNOSIS — M6281 Muscle weakness (generalized): Secondary | ICD-10-CM | POA: Diagnosis not present

## 2018-09-14 DIAGNOSIS — M25561 Pain in right knee: Secondary | ICD-10-CM | POA: Diagnosis not present

## 2018-09-14 DIAGNOSIS — E1122 Type 2 diabetes mellitus with diabetic chronic kidney disease: Secondary | ICD-10-CM | POA: Diagnosis not present

## 2018-09-14 DIAGNOSIS — D631 Anemia in chronic kidney disease: Secondary | ICD-10-CM | POA: Diagnosis not present

## 2018-09-14 DIAGNOSIS — E11621 Type 2 diabetes mellitus with foot ulcer: Secondary | ICD-10-CM | POA: Diagnosis not present

## 2018-09-14 DIAGNOSIS — I4891 Unspecified atrial fibrillation: Secondary | ICD-10-CM | POA: Diagnosis not present

## 2018-09-14 DIAGNOSIS — R52 Pain, unspecified: Secondary | ICD-10-CM | POA: Diagnosis not present

## 2018-09-14 DIAGNOSIS — R41841 Cognitive communication deficit: Secondary | ICD-10-CM | POA: Diagnosis not present

## 2018-09-14 DIAGNOSIS — R278 Other lack of coordination: Secondary | ICD-10-CM | POA: Diagnosis not present

## 2018-09-14 DIAGNOSIS — R2681 Unsteadiness on feet: Secondary | ICD-10-CM | POA: Diagnosis not present

## 2018-09-14 DIAGNOSIS — I10 Essential (primary) hypertension: Secondary | ICD-10-CM | POA: Diagnosis not present

## 2018-09-15 DIAGNOSIS — M069 Rheumatoid arthritis, unspecified: Secondary | ICD-10-CM | POA: Diagnosis not present

## 2018-09-15 DIAGNOSIS — N3020 Other chronic cystitis without hematuria: Secondary | ICD-10-CM | POA: Diagnosis not present

## 2018-09-15 DIAGNOSIS — I4821 Permanent atrial fibrillation: Secondary | ICD-10-CM | POA: Diagnosis not present

## 2018-09-15 DIAGNOSIS — I10 Essential (primary) hypertension: Secondary | ICD-10-CM | POA: Diagnosis not present

## 2018-09-15 DIAGNOSIS — I5032 Chronic diastolic (congestive) heart failure: Secondary | ICD-10-CM | POA: Diagnosis not present

## 2018-09-15 DIAGNOSIS — F419 Anxiety disorder, unspecified: Secondary | ICD-10-CM | POA: Diagnosis not present

## 2018-09-17 DIAGNOSIS — M6281 Muscle weakness (generalized): Secondary | ICD-10-CM | POA: Diagnosis not present

## 2018-09-17 DIAGNOSIS — R2681 Unsteadiness on feet: Secondary | ICD-10-CM | POA: Diagnosis not present

## 2018-09-17 DIAGNOSIS — E1122 Type 2 diabetes mellitus with diabetic chronic kidney disease: Secondary | ICD-10-CM | POA: Diagnosis not present

## 2018-09-17 DIAGNOSIS — D631 Anemia in chronic kidney disease: Secondary | ICD-10-CM | POA: Diagnosis not present

## 2018-09-17 DIAGNOSIS — I4891 Unspecified atrial fibrillation: Secondary | ICD-10-CM | POA: Diagnosis not present

## 2018-09-17 DIAGNOSIS — I10 Essential (primary) hypertension: Secondary | ICD-10-CM | POA: Diagnosis not present

## 2018-09-17 DIAGNOSIS — R278 Other lack of coordination: Secondary | ICD-10-CM | POA: Diagnosis not present

## 2018-09-17 DIAGNOSIS — E11621 Type 2 diabetes mellitus with foot ulcer: Secondary | ICD-10-CM | POA: Diagnosis not present

## 2018-09-17 DIAGNOSIS — R41841 Cognitive communication deficit: Secondary | ICD-10-CM | POA: Diagnosis not present

## 2018-09-18 DIAGNOSIS — E1122 Type 2 diabetes mellitus with diabetic chronic kidney disease: Secondary | ICD-10-CM | POA: Diagnosis not present

## 2018-09-18 DIAGNOSIS — I10 Essential (primary) hypertension: Secondary | ICD-10-CM | POA: Diagnosis not present

## 2018-09-18 DIAGNOSIS — I4891 Unspecified atrial fibrillation: Secondary | ICD-10-CM | POA: Diagnosis not present

## 2018-09-18 DIAGNOSIS — R2681 Unsteadiness on feet: Secondary | ICD-10-CM | POA: Diagnosis not present

## 2018-09-18 DIAGNOSIS — R41841 Cognitive communication deficit: Secondary | ICD-10-CM | POA: Diagnosis not present

## 2018-09-18 DIAGNOSIS — D631 Anemia in chronic kidney disease: Secondary | ICD-10-CM | POA: Diagnosis not present

## 2018-09-18 DIAGNOSIS — R278 Other lack of coordination: Secondary | ICD-10-CM | POA: Diagnosis not present

## 2018-09-18 DIAGNOSIS — E11621 Type 2 diabetes mellitus with foot ulcer: Secondary | ICD-10-CM | POA: Diagnosis not present

## 2018-09-18 DIAGNOSIS — M6281 Muscle weakness (generalized): Secondary | ICD-10-CM | POA: Diagnosis not present

## 2018-09-19 DIAGNOSIS — I4891 Unspecified atrial fibrillation: Secondary | ICD-10-CM | POA: Diagnosis not present

## 2018-09-19 DIAGNOSIS — E1122 Type 2 diabetes mellitus with diabetic chronic kidney disease: Secondary | ICD-10-CM | POA: Diagnosis not present

## 2018-09-19 DIAGNOSIS — M6281 Muscle weakness (generalized): Secondary | ICD-10-CM | POA: Diagnosis not present

## 2018-09-19 DIAGNOSIS — D631 Anemia in chronic kidney disease: Secondary | ICD-10-CM | POA: Diagnosis not present

## 2018-09-19 DIAGNOSIS — I10 Essential (primary) hypertension: Secondary | ICD-10-CM | POA: Diagnosis not present

## 2018-09-19 DIAGNOSIS — R41841 Cognitive communication deficit: Secondary | ICD-10-CM | POA: Diagnosis not present

## 2018-09-19 DIAGNOSIS — E11621 Type 2 diabetes mellitus with foot ulcer: Secondary | ICD-10-CM | POA: Diagnosis not present

## 2018-09-19 DIAGNOSIS — R278 Other lack of coordination: Secondary | ICD-10-CM | POA: Diagnosis not present

## 2018-09-19 DIAGNOSIS — R2681 Unsteadiness on feet: Secondary | ICD-10-CM | POA: Diagnosis not present

## 2018-09-20 DIAGNOSIS — R2681 Unsteadiness on feet: Secondary | ICD-10-CM | POA: Diagnosis not present

## 2018-09-20 DIAGNOSIS — R278 Other lack of coordination: Secondary | ICD-10-CM | POA: Diagnosis not present

## 2018-09-20 DIAGNOSIS — E11621 Type 2 diabetes mellitus with foot ulcer: Secondary | ICD-10-CM | POA: Diagnosis not present

## 2018-09-20 DIAGNOSIS — E1122 Type 2 diabetes mellitus with diabetic chronic kidney disease: Secondary | ICD-10-CM | POA: Diagnosis not present

## 2018-09-20 DIAGNOSIS — I10 Essential (primary) hypertension: Secondary | ICD-10-CM | POA: Diagnosis not present

## 2018-09-20 DIAGNOSIS — D631 Anemia in chronic kidney disease: Secondary | ICD-10-CM | POA: Diagnosis not present

## 2018-09-20 DIAGNOSIS — I4891 Unspecified atrial fibrillation: Secondary | ICD-10-CM | POA: Diagnosis not present

## 2018-09-20 DIAGNOSIS — R41841 Cognitive communication deficit: Secondary | ICD-10-CM | POA: Diagnosis not present

## 2018-09-20 DIAGNOSIS — M6281 Muscle weakness (generalized): Secondary | ICD-10-CM | POA: Diagnosis not present

## 2018-09-21 DIAGNOSIS — M6281 Muscle weakness (generalized): Secondary | ICD-10-CM | POA: Diagnosis not present

## 2018-09-21 DIAGNOSIS — D631 Anemia in chronic kidney disease: Secondary | ICD-10-CM | POA: Diagnosis not present

## 2018-09-21 DIAGNOSIS — I10 Essential (primary) hypertension: Secondary | ICD-10-CM | POA: Diagnosis not present

## 2018-09-21 DIAGNOSIS — E1122 Type 2 diabetes mellitus with diabetic chronic kidney disease: Secondary | ICD-10-CM | POA: Diagnosis not present

## 2018-09-21 DIAGNOSIS — E11621 Type 2 diabetes mellitus with foot ulcer: Secondary | ICD-10-CM | POA: Diagnosis not present

## 2018-09-21 DIAGNOSIS — I4891 Unspecified atrial fibrillation: Secondary | ICD-10-CM | POA: Diagnosis not present

## 2018-09-21 DIAGNOSIS — R41841 Cognitive communication deficit: Secondary | ICD-10-CM | POA: Diagnosis not present

## 2018-09-21 DIAGNOSIS — R278 Other lack of coordination: Secondary | ICD-10-CM | POA: Diagnosis not present

## 2018-09-21 DIAGNOSIS — R2681 Unsteadiness on feet: Secondary | ICD-10-CM | POA: Diagnosis not present

## 2018-09-24 DIAGNOSIS — M6281 Muscle weakness (generalized): Secondary | ICD-10-CM | POA: Diagnosis not present

## 2018-09-24 DIAGNOSIS — R41841 Cognitive communication deficit: Secondary | ICD-10-CM | POA: Diagnosis not present

## 2018-09-24 DIAGNOSIS — D631 Anemia in chronic kidney disease: Secondary | ICD-10-CM | POA: Diagnosis not present

## 2018-09-24 DIAGNOSIS — I10 Essential (primary) hypertension: Secondary | ICD-10-CM | POA: Diagnosis not present

## 2018-09-24 DIAGNOSIS — E11621 Type 2 diabetes mellitus with foot ulcer: Secondary | ICD-10-CM | POA: Diagnosis not present

## 2018-09-24 DIAGNOSIS — I4891 Unspecified atrial fibrillation: Secondary | ICD-10-CM | POA: Diagnosis not present

## 2018-09-24 DIAGNOSIS — R2681 Unsteadiness on feet: Secondary | ICD-10-CM | POA: Diagnosis not present

## 2018-09-24 DIAGNOSIS — E1122 Type 2 diabetes mellitus with diabetic chronic kidney disease: Secondary | ICD-10-CM | POA: Diagnosis not present

## 2018-09-24 DIAGNOSIS — R278 Other lack of coordination: Secondary | ICD-10-CM | POA: Diagnosis not present

## 2018-09-25 DIAGNOSIS — D631 Anemia in chronic kidney disease: Secondary | ICD-10-CM | POA: Diagnosis not present

## 2018-09-25 DIAGNOSIS — R2681 Unsteadiness on feet: Secondary | ICD-10-CM | POA: Diagnosis not present

## 2018-09-25 DIAGNOSIS — R278 Other lack of coordination: Secondary | ICD-10-CM | POA: Diagnosis not present

## 2018-09-25 DIAGNOSIS — M6281 Muscle weakness (generalized): Secondary | ICD-10-CM | POA: Diagnosis not present

## 2018-09-25 DIAGNOSIS — I10 Essential (primary) hypertension: Secondary | ICD-10-CM | POA: Diagnosis not present

## 2018-09-25 DIAGNOSIS — I4891 Unspecified atrial fibrillation: Secondary | ICD-10-CM | POA: Diagnosis not present

## 2018-09-25 DIAGNOSIS — R41841 Cognitive communication deficit: Secondary | ICD-10-CM | POA: Diagnosis not present

## 2018-09-25 DIAGNOSIS — E11621 Type 2 diabetes mellitus with foot ulcer: Secondary | ICD-10-CM | POA: Diagnosis not present

## 2018-09-25 DIAGNOSIS — E1122 Type 2 diabetes mellitus with diabetic chronic kidney disease: Secondary | ICD-10-CM | POA: Diagnosis not present

## 2018-09-26 DIAGNOSIS — R2681 Unsteadiness on feet: Secondary | ICD-10-CM | POA: Diagnosis not present

## 2018-09-26 DIAGNOSIS — R278 Other lack of coordination: Secondary | ICD-10-CM | POA: Diagnosis not present

## 2018-09-26 DIAGNOSIS — I10 Essential (primary) hypertension: Secondary | ICD-10-CM | POA: Diagnosis not present

## 2018-09-26 DIAGNOSIS — R41841 Cognitive communication deficit: Secondary | ICD-10-CM | POA: Diagnosis not present

## 2018-09-26 DIAGNOSIS — D631 Anemia in chronic kidney disease: Secondary | ICD-10-CM | POA: Diagnosis not present

## 2018-09-26 DIAGNOSIS — I4891 Unspecified atrial fibrillation: Secondary | ICD-10-CM | POA: Diagnosis not present

## 2018-09-26 DIAGNOSIS — M6281 Muscle weakness (generalized): Secondary | ICD-10-CM | POA: Diagnosis not present

## 2018-09-26 DIAGNOSIS — E11621 Type 2 diabetes mellitus with foot ulcer: Secondary | ICD-10-CM | POA: Diagnosis not present

## 2018-09-26 DIAGNOSIS — E1122 Type 2 diabetes mellitus with diabetic chronic kidney disease: Secondary | ICD-10-CM | POA: Diagnosis not present

## 2018-09-27 DIAGNOSIS — E11621 Type 2 diabetes mellitus with foot ulcer: Secondary | ICD-10-CM | POA: Diagnosis not present

## 2018-09-27 DIAGNOSIS — D631 Anemia in chronic kidney disease: Secondary | ICD-10-CM | POA: Diagnosis not present

## 2018-09-27 DIAGNOSIS — E1122 Type 2 diabetes mellitus with diabetic chronic kidney disease: Secondary | ICD-10-CM | POA: Diagnosis not present

## 2018-09-27 DIAGNOSIS — M6281 Muscle weakness (generalized): Secondary | ICD-10-CM | POA: Diagnosis not present

## 2018-09-27 DIAGNOSIS — R41841 Cognitive communication deficit: Secondary | ICD-10-CM | POA: Diagnosis not present

## 2018-09-27 DIAGNOSIS — R278 Other lack of coordination: Secondary | ICD-10-CM | POA: Diagnosis not present

## 2018-09-27 DIAGNOSIS — I10 Essential (primary) hypertension: Secondary | ICD-10-CM | POA: Diagnosis not present

## 2018-09-27 DIAGNOSIS — R2681 Unsteadiness on feet: Secondary | ICD-10-CM | POA: Diagnosis not present

## 2018-09-27 DIAGNOSIS — I4891 Unspecified atrial fibrillation: Secondary | ICD-10-CM | POA: Diagnosis not present

## 2018-09-28 DIAGNOSIS — R278 Other lack of coordination: Secondary | ICD-10-CM | POA: Diagnosis not present

## 2018-09-28 DIAGNOSIS — M6281 Muscle weakness (generalized): Secondary | ICD-10-CM | POA: Diagnosis not present

## 2018-09-28 DIAGNOSIS — R2681 Unsteadiness on feet: Secondary | ICD-10-CM | POA: Diagnosis not present

## 2018-09-28 DIAGNOSIS — I10 Essential (primary) hypertension: Secondary | ICD-10-CM | POA: Diagnosis not present

## 2018-09-28 DIAGNOSIS — I4891 Unspecified atrial fibrillation: Secondary | ICD-10-CM | POA: Diagnosis not present

## 2018-09-28 DIAGNOSIS — D631 Anemia in chronic kidney disease: Secondary | ICD-10-CM | POA: Diagnosis not present

## 2018-09-28 DIAGNOSIS — E1122 Type 2 diabetes mellitus with diabetic chronic kidney disease: Secondary | ICD-10-CM | POA: Diagnosis not present

## 2018-09-28 DIAGNOSIS — E11621 Type 2 diabetes mellitus with foot ulcer: Secondary | ICD-10-CM | POA: Diagnosis not present

## 2018-09-28 DIAGNOSIS — R41841 Cognitive communication deficit: Secondary | ICD-10-CM | POA: Diagnosis not present

## 2018-10-01 DIAGNOSIS — R278 Other lack of coordination: Secondary | ICD-10-CM | POA: Diagnosis not present

## 2018-10-01 DIAGNOSIS — E11621 Type 2 diabetes mellitus with foot ulcer: Secondary | ICD-10-CM | POA: Diagnosis not present

## 2018-10-01 DIAGNOSIS — D631 Anemia in chronic kidney disease: Secondary | ICD-10-CM | POA: Diagnosis not present

## 2018-10-01 DIAGNOSIS — R41841 Cognitive communication deficit: Secondary | ICD-10-CM | POA: Diagnosis not present

## 2018-10-01 DIAGNOSIS — I10 Essential (primary) hypertension: Secondary | ICD-10-CM | POA: Diagnosis not present

## 2018-10-01 DIAGNOSIS — E1122 Type 2 diabetes mellitus with diabetic chronic kidney disease: Secondary | ICD-10-CM | POA: Diagnosis not present

## 2018-10-01 DIAGNOSIS — I4891 Unspecified atrial fibrillation: Secondary | ICD-10-CM | POA: Diagnosis not present

## 2018-10-01 DIAGNOSIS — R2681 Unsteadiness on feet: Secondary | ICD-10-CM | POA: Diagnosis not present

## 2018-10-01 DIAGNOSIS — M6281 Muscle weakness (generalized): Secondary | ICD-10-CM | POA: Diagnosis not present

## 2018-10-02 DIAGNOSIS — E11621 Type 2 diabetes mellitus with foot ulcer: Secondary | ICD-10-CM | POA: Diagnosis not present

## 2018-10-02 DIAGNOSIS — G301 Alzheimer's disease with late onset: Secondary | ICD-10-CM | POA: Diagnosis not present

## 2018-10-02 DIAGNOSIS — F331 Major depressive disorder, recurrent, moderate: Secondary | ICD-10-CM | POA: Diagnosis not present

## 2018-10-02 DIAGNOSIS — R41841 Cognitive communication deficit: Secondary | ICD-10-CM | POA: Diagnosis not present

## 2018-10-02 DIAGNOSIS — I4891 Unspecified atrial fibrillation: Secondary | ICD-10-CM | POA: Diagnosis not present

## 2018-10-02 DIAGNOSIS — R278 Other lack of coordination: Secondary | ICD-10-CM | POA: Diagnosis not present

## 2018-10-02 DIAGNOSIS — E1122 Type 2 diabetes mellitus with diabetic chronic kidney disease: Secondary | ICD-10-CM | POA: Diagnosis not present

## 2018-10-02 DIAGNOSIS — R2681 Unsteadiness on feet: Secondary | ICD-10-CM | POA: Diagnosis not present

## 2018-10-02 DIAGNOSIS — M6281 Muscle weakness (generalized): Secondary | ICD-10-CM | POA: Diagnosis not present

## 2018-10-02 DIAGNOSIS — I10 Essential (primary) hypertension: Secondary | ICD-10-CM | POA: Diagnosis not present

## 2018-10-02 DIAGNOSIS — F0280 Dementia in other diseases classified elsewhere without behavioral disturbance: Secondary | ICD-10-CM | POA: Diagnosis not present

## 2018-10-02 DIAGNOSIS — D631 Anemia in chronic kidney disease: Secondary | ICD-10-CM | POA: Diagnosis not present

## 2018-10-03 DIAGNOSIS — E11621 Type 2 diabetes mellitus with foot ulcer: Secondary | ICD-10-CM | POA: Diagnosis not present

## 2018-10-03 DIAGNOSIS — M6281 Muscle weakness (generalized): Secondary | ICD-10-CM | POA: Diagnosis not present

## 2018-10-03 DIAGNOSIS — R41841 Cognitive communication deficit: Secondary | ICD-10-CM | POA: Diagnosis not present

## 2018-10-03 DIAGNOSIS — I10 Essential (primary) hypertension: Secondary | ICD-10-CM | POA: Diagnosis not present

## 2018-10-03 DIAGNOSIS — D631 Anemia in chronic kidney disease: Secondary | ICD-10-CM | POA: Diagnosis not present

## 2018-10-03 DIAGNOSIS — F0280 Dementia in other diseases classified elsewhere without behavioral disturbance: Secondary | ICD-10-CM | POA: Diagnosis not present

## 2018-10-03 DIAGNOSIS — I4891 Unspecified atrial fibrillation: Secondary | ICD-10-CM | POA: Diagnosis not present

## 2018-10-03 DIAGNOSIS — R2681 Unsteadiness on feet: Secondary | ICD-10-CM | POA: Diagnosis not present

## 2018-10-03 DIAGNOSIS — G301 Alzheimer's disease with late onset: Secondary | ICD-10-CM | POA: Diagnosis not present

## 2018-10-03 DIAGNOSIS — R278 Other lack of coordination: Secondary | ICD-10-CM | POA: Diagnosis not present

## 2018-10-03 DIAGNOSIS — F331 Major depressive disorder, recurrent, moderate: Secondary | ICD-10-CM | POA: Diagnosis not present

## 2018-10-03 DIAGNOSIS — E1122 Type 2 diabetes mellitus with diabetic chronic kidney disease: Secondary | ICD-10-CM | POA: Diagnosis not present

## 2018-10-04 DIAGNOSIS — E11621 Type 2 diabetes mellitus with foot ulcer: Secondary | ICD-10-CM | POA: Diagnosis not present

## 2018-10-04 DIAGNOSIS — R41841 Cognitive communication deficit: Secondary | ICD-10-CM | POA: Diagnosis not present

## 2018-10-04 DIAGNOSIS — M6281 Muscle weakness (generalized): Secondary | ICD-10-CM | POA: Diagnosis not present

## 2018-10-04 DIAGNOSIS — I4891 Unspecified atrial fibrillation: Secondary | ICD-10-CM | POA: Diagnosis not present

## 2018-10-04 DIAGNOSIS — R2681 Unsteadiness on feet: Secondary | ICD-10-CM | POA: Diagnosis not present

## 2018-10-04 DIAGNOSIS — I10 Essential (primary) hypertension: Secondary | ICD-10-CM | POA: Diagnosis not present

## 2018-10-04 DIAGNOSIS — D631 Anemia in chronic kidney disease: Secondary | ICD-10-CM | POA: Diagnosis not present

## 2018-10-04 DIAGNOSIS — R278 Other lack of coordination: Secondary | ICD-10-CM | POA: Diagnosis not present

## 2018-10-04 DIAGNOSIS — E1122 Type 2 diabetes mellitus with diabetic chronic kidney disease: Secondary | ICD-10-CM | POA: Diagnosis not present

## 2018-10-05 DIAGNOSIS — R2681 Unsteadiness on feet: Secondary | ICD-10-CM | POA: Diagnosis not present

## 2018-10-05 DIAGNOSIS — E1122 Type 2 diabetes mellitus with diabetic chronic kidney disease: Secondary | ICD-10-CM | POA: Diagnosis not present

## 2018-10-05 DIAGNOSIS — M6281 Muscle weakness (generalized): Secondary | ICD-10-CM | POA: Diagnosis not present

## 2018-10-05 DIAGNOSIS — E11621 Type 2 diabetes mellitus with foot ulcer: Secondary | ICD-10-CM | POA: Diagnosis not present

## 2018-10-05 DIAGNOSIS — D631 Anemia in chronic kidney disease: Secondary | ICD-10-CM | POA: Diagnosis not present

## 2018-10-05 DIAGNOSIS — R278 Other lack of coordination: Secondary | ICD-10-CM | POA: Diagnosis not present

## 2018-10-05 DIAGNOSIS — I10 Essential (primary) hypertension: Secondary | ICD-10-CM | POA: Diagnosis not present

## 2018-10-05 DIAGNOSIS — R41841 Cognitive communication deficit: Secondary | ICD-10-CM | POA: Diagnosis not present

## 2018-10-05 DIAGNOSIS — I4891 Unspecified atrial fibrillation: Secondary | ICD-10-CM | POA: Diagnosis not present

## 2018-10-08 DIAGNOSIS — M6281 Muscle weakness (generalized): Secondary | ICD-10-CM | POA: Diagnosis not present

## 2018-10-08 DIAGNOSIS — D631 Anemia in chronic kidney disease: Secondary | ICD-10-CM | POA: Diagnosis not present

## 2018-10-08 DIAGNOSIS — I4891 Unspecified atrial fibrillation: Secondary | ICD-10-CM | POA: Diagnosis not present

## 2018-10-08 DIAGNOSIS — E1122 Type 2 diabetes mellitus with diabetic chronic kidney disease: Secondary | ICD-10-CM | POA: Diagnosis not present

## 2018-10-08 DIAGNOSIS — R41841 Cognitive communication deficit: Secondary | ICD-10-CM | POA: Diagnosis not present

## 2018-10-08 DIAGNOSIS — I10 Essential (primary) hypertension: Secondary | ICD-10-CM | POA: Diagnosis not present

## 2018-10-08 DIAGNOSIS — R278 Other lack of coordination: Secondary | ICD-10-CM | POA: Diagnosis not present

## 2018-10-08 DIAGNOSIS — R2681 Unsteadiness on feet: Secondary | ICD-10-CM | POA: Diagnosis not present

## 2018-10-08 DIAGNOSIS — E11621 Type 2 diabetes mellitus with foot ulcer: Secondary | ICD-10-CM | POA: Diagnosis not present

## 2018-10-09 DIAGNOSIS — E11621 Type 2 diabetes mellitus with foot ulcer: Secondary | ICD-10-CM | POA: Diagnosis not present

## 2018-10-09 DIAGNOSIS — F411 Generalized anxiety disorder: Secondary | ICD-10-CM | POA: Diagnosis not present

## 2018-10-09 DIAGNOSIS — I4891 Unspecified atrial fibrillation: Secondary | ICD-10-CM | POA: Diagnosis not present

## 2018-10-09 DIAGNOSIS — R41841 Cognitive communication deficit: Secondary | ICD-10-CM | POA: Diagnosis not present

## 2018-10-09 DIAGNOSIS — I10 Essential (primary) hypertension: Secondary | ICD-10-CM | POA: Diagnosis not present

## 2018-10-09 DIAGNOSIS — F0280 Dementia in other diseases classified elsewhere without behavioral disturbance: Secondary | ICD-10-CM | POA: Diagnosis not present

## 2018-10-09 DIAGNOSIS — R278 Other lack of coordination: Secondary | ICD-10-CM | POA: Diagnosis not present

## 2018-10-09 DIAGNOSIS — R2681 Unsteadiness on feet: Secondary | ICD-10-CM | POA: Diagnosis not present

## 2018-10-09 DIAGNOSIS — M6281 Muscle weakness (generalized): Secondary | ICD-10-CM | POA: Diagnosis not present

## 2018-10-09 DIAGNOSIS — D631 Anemia in chronic kidney disease: Secondary | ICD-10-CM | POA: Diagnosis not present

## 2018-10-09 DIAGNOSIS — E1122 Type 2 diabetes mellitus with diabetic chronic kidney disease: Secondary | ICD-10-CM | POA: Diagnosis not present

## 2018-10-10 DIAGNOSIS — Z20828 Contact with and (suspected) exposure to other viral communicable diseases: Secondary | ICD-10-CM | POA: Diagnosis not present

## 2018-10-10 DIAGNOSIS — I4891 Unspecified atrial fibrillation: Secondary | ICD-10-CM | POA: Diagnosis not present

## 2018-10-10 DIAGNOSIS — E11621 Type 2 diabetes mellitus with foot ulcer: Secondary | ICD-10-CM | POA: Diagnosis not present

## 2018-10-10 DIAGNOSIS — R2681 Unsteadiness on feet: Secondary | ICD-10-CM | POA: Diagnosis not present

## 2018-10-10 DIAGNOSIS — D631 Anemia in chronic kidney disease: Secondary | ICD-10-CM | POA: Diagnosis not present

## 2018-10-10 DIAGNOSIS — M6281 Muscle weakness (generalized): Secondary | ICD-10-CM | POA: Diagnosis not present

## 2018-10-10 DIAGNOSIS — E1122 Type 2 diabetes mellitus with diabetic chronic kidney disease: Secondary | ICD-10-CM | POA: Diagnosis not present

## 2018-10-10 DIAGNOSIS — R41841 Cognitive communication deficit: Secondary | ICD-10-CM | POA: Diagnosis not present

## 2018-10-10 DIAGNOSIS — I10 Essential (primary) hypertension: Secondary | ICD-10-CM | POA: Diagnosis not present

## 2018-10-10 DIAGNOSIS — R278 Other lack of coordination: Secondary | ICD-10-CM | POA: Diagnosis not present

## 2018-10-11 DIAGNOSIS — R278 Other lack of coordination: Secondary | ICD-10-CM | POA: Diagnosis not present

## 2018-10-11 DIAGNOSIS — I10 Essential (primary) hypertension: Secondary | ICD-10-CM | POA: Diagnosis not present

## 2018-10-11 DIAGNOSIS — R2681 Unsteadiness on feet: Secondary | ICD-10-CM | POA: Diagnosis not present

## 2018-10-11 DIAGNOSIS — D631 Anemia in chronic kidney disease: Secondary | ICD-10-CM | POA: Diagnosis not present

## 2018-10-11 DIAGNOSIS — I4891 Unspecified atrial fibrillation: Secondary | ICD-10-CM | POA: Diagnosis not present

## 2018-10-11 DIAGNOSIS — E11621 Type 2 diabetes mellitus with foot ulcer: Secondary | ICD-10-CM | POA: Diagnosis not present

## 2018-10-11 DIAGNOSIS — E1122 Type 2 diabetes mellitus with diabetic chronic kidney disease: Secondary | ICD-10-CM | POA: Diagnosis not present

## 2018-10-11 DIAGNOSIS — M6281 Muscle weakness (generalized): Secondary | ICD-10-CM | POA: Diagnosis not present

## 2018-10-11 DIAGNOSIS — R41841 Cognitive communication deficit: Secondary | ICD-10-CM | POA: Diagnosis not present

## 2018-10-12 DIAGNOSIS — E1122 Type 2 diabetes mellitus with diabetic chronic kidney disease: Secondary | ICD-10-CM | POA: Diagnosis not present

## 2018-10-12 DIAGNOSIS — E11621 Type 2 diabetes mellitus with foot ulcer: Secondary | ICD-10-CM | POA: Diagnosis not present

## 2018-10-12 DIAGNOSIS — M6281 Muscle weakness (generalized): Secondary | ICD-10-CM | POA: Diagnosis not present

## 2018-10-12 DIAGNOSIS — R2681 Unsteadiness on feet: Secondary | ICD-10-CM | POA: Diagnosis not present

## 2018-10-12 DIAGNOSIS — I4891 Unspecified atrial fibrillation: Secondary | ICD-10-CM | POA: Diagnosis not present

## 2018-10-12 DIAGNOSIS — R41841 Cognitive communication deficit: Secondary | ICD-10-CM | POA: Diagnosis not present

## 2018-10-12 DIAGNOSIS — D631 Anemia in chronic kidney disease: Secondary | ICD-10-CM | POA: Diagnosis not present

## 2018-10-12 DIAGNOSIS — I10 Essential (primary) hypertension: Secondary | ICD-10-CM | POA: Diagnosis not present

## 2018-10-12 DIAGNOSIS — R278 Other lack of coordination: Secondary | ICD-10-CM | POA: Diagnosis not present

## 2018-10-15 DIAGNOSIS — I4891 Unspecified atrial fibrillation: Secondary | ICD-10-CM | POA: Diagnosis not present

## 2018-10-15 DIAGNOSIS — D631 Anemia in chronic kidney disease: Secondary | ICD-10-CM | POA: Diagnosis not present

## 2018-10-15 DIAGNOSIS — I10 Essential (primary) hypertension: Secondary | ICD-10-CM | POA: Diagnosis not present

## 2018-10-15 DIAGNOSIS — R41841 Cognitive communication deficit: Secondary | ICD-10-CM | POA: Diagnosis not present

## 2018-10-15 DIAGNOSIS — E1122 Type 2 diabetes mellitus with diabetic chronic kidney disease: Secondary | ICD-10-CM | POA: Diagnosis not present

## 2018-10-15 DIAGNOSIS — E11621 Type 2 diabetes mellitus with foot ulcer: Secondary | ICD-10-CM | POA: Diagnosis not present

## 2018-10-15 DIAGNOSIS — R2681 Unsteadiness on feet: Secondary | ICD-10-CM | POA: Diagnosis not present

## 2018-10-15 DIAGNOSIS — M6281 Muscle weakness (generalized): Secondary | ICD-10-CM | POA: Diagnosis not present

## 2018-10-15 DIAGNOSIS — R278 Other lack of coordination: Secondary | ICD-10-CM | POA: Diagnosis not present

## 2018-10-16 DIAGNOSIS — E1122 Type 2 diabetes mellitus with diabetic chronic kidney disease: Secondary | ICD-10-CM | POA: Diagnosis not present

## 2018-10-16 DIAGNOSIS — I10 Essential (primary) hypertension: Secondary | ICD-10-CM | POA: Diagnosis not present

## 2018-10-16 DIAGNOSIS — R41841 Cognitive communication deficit: Secondary | ICD-10-CM | POA: Diagnosis not present

## 2018-10-16 DIAGNOSIS — M6281 Muscle weakness (generalized): Secondary | ICD-10-CM | POA: Diagnosis not present

## 2018-10-16 DIAGNOSIS — D631 Anemia in chronic kidney disease: Secondary | ICD-10-CM | POA: Diagnosis not present

## 2018-10-16 DIAGNOSIS — R2681 Unsteadiness on feet: Secondary | ICD-10-CM | POA: Diagnosis not present

## 2018-10-16 DIAGNOSIS — I4891 Unspecified atrial fibrillation: Secondary | ICD-10-CM | POA: Diagnosis not present

## 2018-10-16 DIAGNOSIS — E11621 Type 2 diabetes mellitus with foot ulcer: Secondary | ICD-10-CM | POA: Diagnosis not present

## 2018-10-16 DIAGNOSIS — R278 Other lack of coordination: Secondary | ICD-10-CM | POA: Diagnosis not present

## 2018-10-17 DIAGNOSIS — Z20828 Contact with and (suspected) exposure to other viral communicable diseases: Secondary | ICD-10-CM | POA: Diagnosis not present

## 2018-10-17 DIAGNOSIS — L602 Onychogryphosis: Secondary | ICD-10-CM | POA: Diagnosis not present

## 2018-10-17 DIAGNOSIS — E1122 Type 2 diabetes mellitus with diabetic chronic kidney disease: Secondary | ICD-10-CM | POA: Diagnosis not present

## 2018-10-17 DIAGNOSIS — R41841 Cognitive communication deficit: Secondary | ICD-10-CM | POA: Diagnosis not present

## 2018-10-17 DIAGNOSIS — I10 Essential (primary) hypertension: Secondary | ICD-10-CM | POA: Diagnosis not present

## 2018-10-17 DIAGNOSIS — R2681 Unsteadiness on feet: Secondary | ICD-10-CM | POA: Diagnosis not present

## 2018-10-17 DIAGNOSIS — E1142 Type 2 diabetes mellitus with diabetic polyneuropathy: Secondary | ICD-10-CM | POA: Diagnosis not present

## 2018-10-17 DIAGNOSIS — I4891 Unspecified atrial fibrillation: Secondary | ICD-10-CM | POA: Diagnosis not present

## 2018-10-17 DIAGNOSIS — R278 Other lack of coordination: Secondary | ICD-10-CM | POA: Diagnosis not present

## 2018-10-17 DIAGNOSIS — M6281 Muscle weakness (generalized): Secondary | ICD-10-CM | POA: Diagnosis not present

## 2018-10-17 DIAGNOSIS — L859 Epidermal thickening, unspecified: Secondary | ICD-10-CM | POA: Diagnosis not present

## 2018-10-17 DIAGNOSIS — E11621 Type 2 diabetes mellitus with foot ulcer: Secondary | ICD-10-CM | POA: Diagnosis not present

## 2018-10-17 DIAGNOSIS — D631 Anemia in chronic kidney disease: Secondary | ICD-10-CM | POA: Diagnosis not present

## 2018-10-18 DIAGNOSIS — E1122 Type 2 diabetes mellitus with diabetic chronic kidney disease: Secondary | ICD-10-CM | POA: Diagnosis not present

## 2018-10-18 DIAGNOSIS — R41841 Cognitive communication deficit: Secondary | ICD-10-CM | POA: Diagnosis not present

## 2018-10-18 DIAGNOSIS — I4891 Unspecified atrial fibrillation: Secondary | ICD-10-CM | POA: Diagnosis not present

## 2018-10-18 DIAGNOSIS — D631 Anemia in chronic kidney disease: Secondary | ICD-10-CM | POA: Diagnosis not present

## 2018-10-18 DIAGNOSIS — M6281 Muscle weakness (generalized): Secondary | ICD-10-CM | POA: Diagnosis not present

## 2018-10-18 DIAGNOSIS — I10 Essential (primary) hypertension: Secondary | ICD-10-CM | POA: Diagnosis not present

## 2018-10-18 DIAGNOSIS — E11621 Type 2 diabetes mellitus with foot ulcer: Secondary | ICD-10-CM | POA: Diagnosis not present

## 2018-10-18 DIAGNOSIS — R278 Other lack of coordination: Secondary | ICD-10-CM | POA: Diagnosis not present

## 2018-10-18 DIAGNOSIS — R2681 Unsteadiness on feet: Secondary | ICD-10-CM | POA: Diagnosis not present

## 2018-10-19 DIAGNOSIS — E11621 Type 2 diabetes mellitus with foot ulcer: Secondary | ICD-10-CM | POA: Diagnosis not present

## 2018-10-19 DIAGNOSIS — R278 Other lack of coordination: Secondary | ICD-10-CM | POA: Diagnosis not present

## 2018-10-19 DIAGNOSIS — D631 Anemia in chronic kidney disease: Secondary | ICD-10-CM | POA: Diagnosis not present

## 2018-10-19 DIAGNOSIS — R41841 Cognitive communication deficit: Secondary | ICD-10-CM | POA: Diagnosis not present

## 2018-10-19 DIAGNOSIS — M6281 Muscle weakness (generalized): Secondary | ICD-10-CM | POA: Diagnosis not present

## 2018-10-19 DIAGNOSIS — I4891 Unspecified atrial fibrillation: Secondary | ICD-10-CM | POA: Diagnosis not present

## 2018-10-19 DIAGNOSIS — I10 Essential (primary) hypertension: Secondary | ICD-10-CM | POA: Diagnosis not present

## 2018-10-19 DIAGNOSIS — E1122 Type 2 diabetes mellitus with diabetic chronic kidney disease: Secondary | ICD-10-CM | POA: Diagnosis not present

## 2018-10-19 DIAGNOSIS — R2681 Unsteadiness on feet: Secondary | ICD-10-CM | POA: Diagnosis not present

## 2018-10-22 DIAGNOSIS — E1122 Type 2 diabetes mellitus with diabetic chronic kidney disease: Secondary | ICD-10-CM | POA: Diagnosis not present

## 2018-10-22 DIAGNOSIS — I4891 Unspecified atrial fibrillation: Secondary | ICD-10-CM | POA: Diagnosis not present

## 2018-10-22 DIAGNOSIS — R278 Other lack of coordination: Secondary | ICD-10-CM | POA: Diagnosis not present

## 2018-10-22 DIAGNOSIS — D631 Anemia in chronic kidney disease: Secondary | ICD-10-CM | POA: Diagnosis not present

## 2018-10-22 DIAGNOSIS — I4821 Permanent atrial fibrillation: Secondary | ICD-10-CM | POA: Diagnosis not present

## 2018-10-22 DIAGNOSIS — E11621 Type 2 diabetes mellitus with foot ulcer: Secondary | ICD-10-CM | POA: Diagnosis not present

## 2018-10-22 DIAGNOSIS — R41841 Cognitive communication deficit: Secondary | ICD-10-CM | POA: Diagnosis not present

## 2018-10-22 DIAGNOSIS — E119 Type 2 diabetes mellitus without complications: Secondary | ICD-10-CM | POA: Diagnosis not present

## 2018-10-22 DIAGNOSIS — I10 Essential (primary) hypertension: Secondary | ICD-10-CM | POA: Diagnosis not present

## 2018-10-22 DIAGNOSIS — M6281 Muscle weakness (generalized): Secondary | ICD-10-CM | POA: Diagnosis not present

## 2018-10-22 DIAGNOSIS — N3020 Other chronic cystitis without hematuria: Secondary | ICD-10-CM | POA: Diagnosis not present

## 2018-10-22 DIAGNOSIS — M069 Rheumatoid arthritis, unspecified: Secondary | ICD-10-CM | POA: Diagnosis not present

## 2018-10-22 DIAGNOSIS — R2681 Unsteadiness on feet: Secondary | ICD-10-CM | POA: Diagnosis not present

## 2018-10-23 DIAGNOSIS — R41841 Cognitive communication deficit: Secondary | ICD-10-CM | POA: Diagnosis not present

## 2018-10-23 DIAGNOSIS — I10 Essential (primary) hypertension: Secondary | ICD-10-CM | POA: Diagnosis not present

## 2018-10-23 DIAGNOSIS — E11621 Type 2 diabetes mellitus with foot ulcer: Secondary | ICD-10-CM | POA: Diagnosis not present

## 2018-10-23 DIAGNOSIS — D631 Anemia in chronic kidney disease: Secondary | ICD-10-CM | POA: Diagnosis not present

## 2018-10-23 DIAGNOSIS — R278 Other lack of coordination: Secondary | ICD-10-CM | POA: Diagnosis not present

## 2018-10-23 DIAGNOSIS — I4891 Unspecified atrial fibrillation: Secondary | ICD-10-CM | POA: Diagnosis not present

## 2018-10-23 DIAGNOSIS — M6281 Muscle weakness (generalized): Secondary | ICD-10-CM | POA: Diagnosis not present

## 2018-10-23 DIAGNOSIS — R2681 Unsteadiness on feet: Secondary | ICD-10-CM | POA: Diagnosis not present

## 2018-10-23 DIAGNOSIS — E1122 Type 2 diabetes mellitus with diabetic chronic kidney disease: Secondary | ICD-10-CM | POA: Diagnosis not present

## 2018-10-24 DIAGNOSIS — Z20828 Contact with and (suspected) exposure to other viral communicable diseases: Secondary | ICD-10-CM | POA: Diagnosis not present

## 2018-10-31 DIAGNOSIS — Z20828 Contact with and (suspected) exposure to other viral communicable diseases: Secondary | ICD-10-CM | POA: Diagnosis not present

## 2018-11-06 DIAGNOSIS — G301 Alzheimer's disease with late onset: Secondary | ICD-10-CM | POA: Diagnosis not present

## 2018-11-06 DIAGNOSIS — F331 Major depressive disorder, recurrent, moderate: Secondary | ICD-10-CM | POA: Diagnosis not present

## 2018-11-07 DIAGNOSIS — F411 Generalized anxiety disorder: Secondary | ICD-10-CM | POA: Diagnosis not present

## 2018-11-07 DIAGNOSIS — R4701 Aphasia: Secondary | ICD-10-CM | POA: Diagnosis not present

## 2018-11-07 DIAGNOSIS — N183 Chronic kidney disease, stage 3 (moderate): Secondary | ICD-10-CM | POA: Diagnosis not present

## 2018-11-07 DIAGNOSIS — I10 Essential (primary) hypertension: Secondary | ICD-10-CM | POA: Diagnosis not present

## 2018-11-07 DIAGNOSIS — I4891 Unspecified atrial fibrillation: Secondary | ICD-10-CM | POA: Diagnosis not present

## 2018-11-07 DIAGNOSIS — M069 Rheumatoid arthritis, unspecified: Secondary | ICD-10-CM | POA: Diagnosis not present

## 2018-11-07 DIAGNOSIS — R0989 Other specified symptoms and signs involving the circulatory and respiratory systems: Secondary | ICD-10-CM | POA: Diagnosis not present

## 2018-11-08 DIAGNOSIS — E785 Hyperlipidemia, unspecified: Secondary | ICD-10-CM | POA: Diagnosis not present

## 2018-11-08 DIAGNOSIS — E119 Type 2 diabetes mellitus without complications: Secondary | ICD-10-CM | POA: Diagnosis not present

## 2018-11-09 DIAGNOSIS — E119 Type 2 diabetes mellitus without complications: Secondary | ICD-10-CM | POA: Diagnosis not present

## 2018-11-09 DIAGNOSIS — I5032 Chronic diastolic (congestive) heart failure: Secondary | ICD-10-CM | POA: Diagnosis not present

## 2018-11-09 DIAGNOSIS — M069 Rheumatoid arthritis, unspecified: Secondary | ICD-10-CM | POA: Diagnosis not present

## 2018-11-09 DIAGNOSIS — I4821 Permanent atrial fibrillation: Secondary | ICD-10-CM | POA: Diagnosis not present

## 2018-11-28 DIAGNOSIS — S98131A Complete traumatic amputation of one right lesser toe, initial encounter: Secondary | ICD-10-CM | POA: Diagnosis not present

## 2018-11-28 DIAGNOSIS — M2042 Other hammer toe(s) (acquired), left foot: Secondary | ICD-10-CM | POA: Diagnosis not present

## 2018-11-28 DIAGNOSIS — M2041 Other hammer toe(s) (acquired), right foot: Secondary | ICD-10-CM | POA: Diagnosis not present

## 2018-11-28 DIAGNOSIS — E1142 Type 2 diabetes mellitus with diabetic polyneuropathy: Secondary | ICD-10-CM | POA: Diagnosis not present

## 2018-11-30 DIAGNOSIS — I482 Chronic atrial fibrillation, unspecified: Secondary | ICD-10-CM | POA: Diagnosis not present

## 2018-11-30 DIAGNOSIS — N183 Chronic kidney disease, stage 3 (moderate): Secondary | ICD-10-CM | POA: Diagnosis not present

## 2018-11-30 DIAGNOSIS — E039 Hypothyroidism, unspecified: Secondary | ICD-10-CM | POA: Diagnosis not present

## 2018-11-30 DIAGNOSIS — M069 Rheumatoid arthritis, unspecified: Secondary | ICD-10-CM | POA: Diagnosis not present

## 2018-12-04 DIAGNOSIS — G301 Alzheimer's disease with late onset: Secondary | ICD-10-CM | POA: Diagnosis not present

## 2018-12-04 DIAGNOSIS — F0281 Dementia in other diseases classified elsewhere with behavioral disturbance: Secondary | ICD-10-CM | POA: Diagnosis not present

## 2018-12-04 DIAGNOSIS — I10 Essential (primary) hypertension: Secondary | ICD-10-CM | POA: Diagnosis not present

## 2018-12-04 DIAGNOSIS — I5032 Chronic diastolic (congestive) heart failure: Secondary | ICD-10-CM | POA: Diagnosis not present

## 2018-12-04 DIAGNOSIS — F331 Major depressive disorder, recurrent, moderate: Secondary | ICD-10-CM | POA: Diagnosis not present

## 2018-12-04 DIAGNOSIS — M069 Rheumatoid arthritis, unspecified: Secondary | ICD-10-CM | POA: Diagnosis not present

## 2018-12-04 DIAGNOSIS — I482 Chronic atrial fibrillation, unspecified: Secondary | ICD-10-CM | POA: Diagnosis not present

## 2018-12-05 DIAGNOSIS — E11621 Type 2 diabetes mellitus with foot ulcer: Secondary | ICD-10-CM | POA: Diagnosis not present

## 2018-12-05 DIAGNOSIS — L97412 Non-pressure chronic ulcer of right heel and midfoot with fat layer exposed: Secondary | ICD-10-CM | POA: Diagnosis not present

## 2018-12-05 DIAGNOSIS — L602 Onychogryphosis: Secondary | ICD-10-CM | POA: Diagnosis not present

## 2018-12-05 DIAGNOSIS — M2042 Other hammer toe(s) (acquired), left foot: Secondary | ICD-10-CM | POA: Diagnosis not present

## 2018-12-05 DIAGNOSIS — E08621 Diabetes mellitus due to underlying condition with foot ulcer: Secondary | ICD-10-CM | POA: Diagnosis not present

## 2018-12-05 DIAGNOSIS — L97522 Non-pressure chronic ulcer of other part of left foot with fat layer exposed: Secondary | ICD-10-CM | POA: Diagnosis not present

## 2018-12-05 DIAGNOSIS — E119 Type 2 diabetes mellitus without complications: Secondary | ICD-10-CM | POA: Diagnosis not present

## 2018-12-05 DIAGNOSIS — E1142 Type 2 diabetes mellitus with diabetic polyneuropathy: Secondary | ICD-10-CM | POA: Diagnosis not present

## 2018-12-05 DIAGNOSIS — M2041 Other hammer toe(s) (acquired), right foot: Secondary | ICD-10-CM | POA: Diagnosis not present

## 2018-12-05 DIAGNOSIS — L859 Epidermal thickening, unspecified: Secondary | ICD-10-CM | POA: Diagnosis not present

## 2018-12-11 DIAGNOSIS — M069 Rheumatoid arthritis, unspecified: Secondary | ICD-10-CM | POA: Diagnosis not present

## 2018-12-11 DIAGNOSIS — I5032 Chronic diastolic (congestive) heart failure: Secondary | ICD-10-CM | POA: Diagnosis not present

## 2018-12-11 DIAGNOSIS — F0390 Unspecified dementia without behavioral disturbance: Secondary | ICD-10-CM | POA: Diagnosis not present

## 2018-12-11 DIAGNOSIS — S81812D Laceration without foreign body, left lower leg, subsequent encounter: Secondary | ICD-10-CM | POA: Diagnosis not present

## 2018-12-15 DIAGNOSIS — I482 Chronic atrial fibrillation, unspecified: Secondary | ICD-10-CM | POA: Diagnosis not present

## 2018-12-15 DIAGNOSIS — D6489 Other specified anemias: Secondary | ICD-10-CM | POA: Diagnosis not present

## 2018-12-15 DIAGNOSIS — F329 Major depressive disorder, single episode, unspecified: Secondary | ICD-10-CM | POA: Diagnosis not present

## 2018-12-15 DIAGNOSIS — E119 Type 2 diabetes mellitus without complications: Secondary | ICD-10-CM | POA: Diagnosis not present

## 2018-12-15 DIAGNOSIS — F419 Anxiety disorder, unspecified: Secondary | ICD-10-CM | POA: Diagnosis not present

## 2018-12-15 DIAGNOSIS — M069 Rheumatoid arthritis, unspecified: Secondary | ICD-10-CM | POA: Diagnosis not present

## 2018-12-15 DIAGNOSIS — F0390 Unspecified dementia without behavioral disturbance: Secondary | ICD-10-CM | POA: Diagnosis not present

## 2018-12-15 DIAGNOSIS — I5032 Chronic diastolic (congestive) heart failure: Secondary | ICD-10-CM | POA: Diagnosis not present

## 2018-12-15 DIAGNOSIS — E039 Hypothyroidism, unspecified: Secondary | ICD-10-CM | POA: Diagnosis not present

## 2018-12-26 DIAGNOSIS — L97412 Non-pressure chronic ulcer of right heel and midfoot with fat layer exposed: Secondary | ICD-10-CM | POA: Diagnosis not present

## 2018-12-26 DIAGNOSIS — E11621 Type 2 diabetes mellitus with foot ulcer: Secondary | ICD-10-CM | POA: Diagnosis not present

## 2018-12-26 DIAGNOSIS — E1142 Type 2 diabetes mellitus with diabetic polyneuropathy: Secondary | ICD-10-CM | POA: Diagnosis not present

## 2018-12-26 DIAGNOSIS — L97522 Non-pressure chronic ulcer of other part of left foot with fat layer exposed: Secondary | ICD-10-CM | POA: Diagnosis not present

## 2018-12-26 DIAGNOSIS — E08621 Diabetes mellitus due to underlying condition with foot ulcer: Secondary | ICD-10-CM | POA: Diagnosis not present

## 2019-01-02 DIAGNOSIS — I4891 Unspecified atrial fibrillation: Secondary | ICD-10-CM | POA: Diagnosis not present

## 2019-01-02 DIAGNOSIS — N183 Chronic kidney disease, stage 3 (moderate): Secondary | ICD-10-CM | POA: Diagnosis not present

## 2019-01-02 DIAGNOSIS — M069 Rheumatoid arthritis, unspecified: Secondary | ICD-10-CM | POA: Diagnosis not present

## 2019-01-02 DIAGNOSIS — R0989 Other specified symptoms and signs involving the circulatory and respiratory systems: Secondary | ICD-10-CM | POA: Diagnosis not present

## 2019-01-02 DIAGNOSIS — F411 Generalized anxiety disorder: Secondary | ICD-10-CM | POA: Diagnosis not present

## 2019-01-02 DIAGNOSIS — I10 Essential (primary) hypertension: Secondary | ICD-10-CM | POA: Diagnosis not present

## 2019-01-02 DIAGNOSIS — R4701 Aphasia: Secondary | ICD-10-CM | POA: Diagnosis not present

## 2019-01-06 DIAGNOSIS — I4821 Permanent atrial fibrillation: Secondary | ICD-10-CM | POA: Diagnosis not present

## 2019-01-06 DIAGNOSIS — D6489 Other specified anemias: Secondary | ICD-10-CM | POA: Diagnosis not present

## 2019-01-06 DIAGNOSIS — M069 Rheumatoid arthritis, unspecified: Secondary | ICD-10-CM | POA: Diagnosis not present

## 2019-01-06 DIAGNOSIS — N3020 Other chronic cystitis without hematuria: Secondary | ICD-10-CM | POA: Diagnosis not present

## 2019-01-06 DIAGNOSIS — F419 Anxiety disorder, unspecified: Secondary | ICD-10-CM | POA: Diagnosis not present

## 2019-01-06 DIAGNOSIS — F329 Major depressive disorder, single episode, unspecified: Secondary | ICD-10-CM | POA: Diagnosis not present

## 2019-01-06 DIAGNOSIS — I10 Essential (primary) hypertension: Secondary | ICD-10-CM | POA: Diagnosis not present

## 2019-01-06 DIAGNOSIS — I5032 Chronic diastolic (congestive) heart failure: Secondary | ICD-10-CM | POA: Diagnosis not present

## 2019-01-10 DIAGNOSIS — Z20828 Contact with and (suspected) exposure to other viral communicable diseases: Secondary | ICD-10-CM | POA: Diagnosis not present

## 2019-01-15 DIAGNOSIS — F331 Major depressive disorder, recurrent, moderate: Secondary | ICD-10-CM | POA: Diagnosis not present

## 2019-01-15 DIAGNOSIS — F0281 Dementia in other diseases classified elsewhere with behavioral disturbance: Secondary | ICD-10-CM | POA: Diagnosis not present

## 2019-01-15 DIAGNOSIS — G301 Alzheimer's disease with late onset: Secondary | ICD-10-CM | POA: Diagnosis not present

## 2019-01-16 DIAGNOSIS — Z89421 Acquired absence of other right toe(s): Secondary | ICD-10-CM | POA: Diagnosis not present

## 2019-01-16 DIAGNOSIS — L97522 Non-pressure chronic ulcer of other part of left foot with fat layer exposed: Secondary | ICD-10-CM | POA: Diagnosis not present

## 2019-01-16 DIAGNOSIS — Z20828 Contact with and (suspected) exposure to other viral communicable diseases: Secondary | ICD-10-CM | POA: Diagnosis not present

## 2019-01-16 DIAGNOSIS — E11621 Type 2 diabetes mellitus with foot ulcer: Secondary | ICD-10-CM | POA: Diagnosis not present

## 2019-01-16 DIAGNOSIS — E1142 Type 2 diabetes mellitus with diabetic polyneuropathy: Secondary | ICD-10-CM | POA: Diagnosis not present

## 2019-01-16 DIAGNOSIS — E08621 Diabetes mellitus due to underlying condition with foot ulcer: Secondary | ICD-10-CM | POA: Diagnosis not present

## 2019-01-16 DIAGNOSIS — L853 Xerosis cutis: Secondary | ICD-10-CM | POA: Diagnosis not present

## 2019-01-16 DIAGNOSIS — L97512 Non-pressure chronic ulcer of other part of right foot with fat layer exposed: Secondary | ICD-10-CM | POA: Diagnosis not present

## 2019-01-17 DIAGNOSIS — R2689 Other abnormalities of gait and mobility: Secondary | ICD-10-CM | POA: Diagnosis not present

## 2019-01-17 DIAGNOSIS — E039 Hypothyroidism, unspecified: Secondary | ICD-10-CM | POA: Diagnosis not present

## 2019-01-17 DIAGNOSIS — D631 Anemia in chronic kidney disease: Secondary | ICD-10-CM | POA: Diagnosis not present

## 2019-01-17 DIAGNOSIS — M6281 Muscle weakness (generalized): Secondary | ICD-10-CM | POA: Diagnosis not present

## 2019-01-17 DIAGNOSIS — I4891 Unspecified atrial fibrillation: Secondary | ICD-10-CM | POA: Diagnosis not present

## 2019-01-17 DIAGNOSIS — K219 Gastro-esophageal reflux disease without esophagitis: Secondary | ICD-10-CM | POA: Diagnosis not present

## 2019-01-17 DIAGNOSIS — M069 Rheumatoid arthritis, unspecified: Secondary | ICD-10-CM | POA: Diagnosis not present

## 2019-01-17 DIAGNOSIS — R2681 Unsteadiness on feet: Secondary | ICD-10-CM | POA: Diagnosis not present

## 2019-01-17 DIAGNOSIS — I10 Essential (primary) hypertension: Secondary | ICD-10-CM | POA: Diagnosis not present

## 2019-01-18 DIAGNOSIS — K219 Gastro-esophageal reflux disease without esophagitis: Secondary | ICD-10-CM | POA: Diagnosis not present

## 2019-01-18 DIAGNOSIS — I4891 Unspecified atrial fibrillation: Secondary | ICD-10-CM | POA: Diagnosis not present

## 2019-01-18 DIAGNOSIS — M069 Rheumatoid arthritis, unspecified: Secondary | ICD-10-CM | POA: Diagnosis not present

## 2019-01-18 DIAGNOSIS — R2689 Other abnormalities of gait and mobility: Secondary | ICD-10-CM | POA: Diagnosis not present

## 2019-01-18 DIAGNOSIS — F0390 Unspecified dementia without behavioral disturbance: Secondary | ICD-10-CM | POA: Diagnosis not present

## 2019-01-18 DIAGNOSIS — I10 Essential (primary) hypertension: Secondary | ICD-10-CM | POA: Diagnosis not present

## 2019-01-18 DIAGNOSIS — D631 Anemia in chronic kidney disease: Secondary | ICD-10-CM | POA: Diagnosis not present

## 2019-01-18 DIAGNOSIS — E039 Hypothyroidism, unspecified: Secondary | ICD-10-CM | POA: Diagnosis not present

## 2019-01-18 DIAGNOSIS — I482 Chronic atrial fibrillation, unspecified: Secondary | ICD-10-CM | POA: Diagnosis not present

## 2019-01-18 DIAGNOSIS — R2681 Unsteadiness on feet: Secondary | ICD-10-CM | POA: Diagnosis not present

## 2019-01-18 DIAGNOSIS — M6281 Muscle weakness (generalized): Secondary | ICD-10-CM | POA: Diagnosis not present

## 2019-01-18 DIAGNOSIS — I5032 Chronic diastolic (congestive) heart failure: Secondary | ICD-10-CM | POA: Diagnosis not present

## 2019-01-19 DIAGNOSIS — M069 Rheumatoid arthritis, unspecified: Secondary | ICD-10-CM | POA: Diagnosis not present

## 2019-01-19 DIAGNOSIS — K219 Gastro-esophageal reflux disease without esophagitis: Secondary | ICD-10-CM | POA: Diagnosis not present

## 2019-01-19 DIAGNOSIS — R2689 Other abnormalities of gait and mobility: Secondary | ICD-10-CM | POA: Diagnosis not present

## 2019-01-19 DIAGNOSIS — I10 Essential (primary) hypertension: Secondary | ICD-10-CM | POA: Diagnosis not present

## 2019-01-19 DIAGNOSIS — M6281 Muscle weakness (generalized): Secondary | ICD-10-CM | POA: Diagnosis not present

## 2019-01-19 DIAGNOSIS — R2681 Unsteadiness on feet: Secondary | ICD-10-CM | POA: Diagnosis not present

## 2019-01-19 DIAGNOSIS — D631 Anemia in chronic kidney disease: Secondary | ICD-10-CM | POA: Diagnosis not present

## 2019-01-19 DIAGNOSIS — E039 Hypothyroidism, unspecified: Secondary | ICD-10-CM | POA: Diagnosis not present

## 2019-01-19 DIAGNOSIS — I4891 Unspecified atrial fibrillation: Secondary | ICD-10-CM | POA: Diagnosis not present

## 2019-01-20 DIAGNOSIS — I4891 Unspecified atrial fibrillation: Secondary | ICD-10-CM | POA: Diagnosis not present

## 2019-01-20 DIAGNOSIS — D631 Anemia in chronic kidney disease: Secondary | ICD-10-CM | POA: Diagnosis not present

## 2019-01-20 DIAGNOSIS — I10 Essential (primary) hypertension: Secondary | ICD-10-CM | POA: Diagnosis not present

## 2019-01-20 DIAGNOSIS — M6281 Muscle weakness (generalized): Secondary | ICD-10-CM | POA: Diagnosis not present

## 2019-01-20 DIAGNOSIS — R2681 Unsteadiness on feet: Secondary | ICD-10-CM | POA: Diagnosis not present

## 2019-01-20 DIAGNOSIS — K219 Gastro-esophageal reflux disease without esophagitis: Secondary | ICD-10-CM | POA: Diagnosis not present

## 2019-01-20 DIAGNOSIS — M069 Rheumatoid arthritis, unspecified: Secondary | ICD-10-CM | POA: Diagnosis not present

## 2019-01-20 DIAGNOSIS — E039 Hypothyroidism, unspecified: Secondary | ICD-10-CM | POA: Diagnosis not present

## 2019-01-20 DIAGNOSIS — R2689 Other abnormalities of gait and mobility: Secondary | ICD-10-CM | POA: Diagnosis not present

## 2019-01-21 DIAGNOSIS — K219 Gastro-esophageal reflux disease without esophagitis: Secondary | ICD-10-CM | POA: Diagnosis not present

## 2019-01-21 DIAGNOSIS — R2689 Other abnormalities of gait and mobility: Secondary | ICD-10-CM | POA: Diagnosis not present

## 2019-01-21 DIAGNOSIS — M069 Rheumatoid arthritis, unspecified: Secondary | ICD-10-CM | POA: Diagnosis not present

## 2019-01-21 DIAGNOSIS — R2681 Unsteadiness on feet: Secondary | ICD-10-CM | POA: Diagnosis not present

## 2019-01-21 DIAGNOSIS — D631 Anemia in chronic kidney disease: Secondary | ICD-10-CM | POA: Diagnosis not present

## 2019-01-21 DIAGNOSIS — M6281 Muscle weakness (generalized): Secondary | ICD-10-CM | POA: Diagnosis not present

## 2019-01-21 DIAGNOSIS — I4891 Unspecified atrial fibrillation: Secondary | ICD-10-CM | POA: Diagnosis not present

## 2019-01-21 DIAGNOSIS — E039 Hypothyroidism, unspecified: Secondary | ICD-10-CM | POA: Diagnosis not present

## 2019-01-21 DIAGNOSIS — I10 Essential (primary) hypertension: Secondary | ICD-10-CM | POA: Diagnosis not present

## 2019-01-23 DIAGNOSIS — K219 Gastro-esophageal reflux disease without esophagitis: Secondary | ICD-10-CM | POA: Diagnosis not present

## 2019-01-23 DIAGNOSIS — E039 Hypothyroidism, unspecified: Secondary | ICD-10-CM | POA: Diagnosis not present

## 2019-01-23 DIAGNOSIS — M6281 Muscle weakness (generalized): Secondary | ICD-10-CM | POA: Diagnosis not present

## 2019-01-23 DIAGNOSIS — E785 Hyperlipidemia, unspecified: Secondary | ICD-10-CM | POA: Diagnosis not present

## 2019-01-23 DIAGNOSIS — E559 Vitamin D deficiency, unspecified: Secondary | ICD-10-CM | POA: Diagnosis not present

## 2019-01-23 DIAGNOSIS — D649 Anemia, unspecified: Secondary | ICD-10-CM | POA: Diagnosis not present

## 2019-01-23 DIAGNOSIS — E119 Type 2 diabetes mellitus without complications: Secondary | ICD-10-CM | POA: Diagnosis not present

## 2019-01-23 DIAGNOSIS — I4891 Unspecified atrial fibrillation: Secondary | ICD-10-CM | POA: Diagnosis not present

## 2019-01-23 DIAGNOSIS — M069 Rheumatoid arthritis, unspecified: Secondary | ICD-10-CM | POA: Diagnosis not present

## 2019-01-23 DIAGNOSIS — R2689 Other abnormalities of gait and mobility: Secondary | ICD-10-CM | POA: Diagnosis not present

## 2019-01-23 DIAGNOSIS — S60221D Contusion of right hand, subsequent encounter: Secondary | ICD-10-CM | POA: Diagnosis not present

## 2019-01-23 DIAGNOSIS — I10 Essential (primary) hypertension: Secondary | ICD-10-CM | POA: Diagnosis not present

## 2019-01-23 DIAGNOSIS — I482 Chronic atrial fibrillation, unspecified: Secondary | ICD-10-CM | POA: Diagnosis not present

## 2019-01-23 DIAGNOSIS — R2681 Unsteadiness on feet: Secondary | ICD-10-CM | POA: Diagnosis not present

## 2019-01-23 DIAGNOSIS — D631 Anemia in chronic kidney disease: Secondary | ICD-10-CM | POA: Diagnosis not present

## 2019-01-23 DIAGNOSIS — I5032 Chronic diastolic (congestive) heart failure: Secondary | ICD-10-CM | POA: Diagnosis not present

## 2019-01-23 DIAGNOSIS — Z79899 Other long term (current) drug therapy: Secondary | ICD-10-CM | POA: Diagnosis not present

## 2019-01-24 DIAGNOSIS — R2681 Unsteadiness on feet: Secondary | ICD-10-CM | POA: Diagnosis not present

## 2019-01-24 DIAGNOSIS — R2689 Other abnormalities of gait and mobility: Secondary | ICD-10-CM | POA: Diagnosis not present

## 2019-01-24 DIAGNOSIS — D631 Anemia in chronic kidney disease: Secondary | ICD-10-CM | POA: Diagnosis not present

## 2019-01-24 DIAGNOSIS — M069 Rheumatoid arthritis, unspecified: Secondary | ICD-10-CM | POA: Diagnosis not present

## 2019-01-24 DIAGNOSIS — E039 Hypothyroidism, unspecified: Secondary | ICD-10-CM | POA: Diagnosis not present

## 2019-01-24 DIAGNOSIS — M6281 Muscle weakness (generalized): Secondary | ICD-10-CM | POA: Diagnosis not present

## 2019-01-24 DIAGNOSIS — I4891 Unspecified atrial fibrillation: Secondary | ICD-10-CM | POA: Diagnosis not present

## 2019-01-24 DIAGNOSIS — K219 Gastro-esophageal reflux disease without esophagitis: Secondary | ICD-10-CM | POA: Diagnosis not present

## 2019-01-24 DIAGNOSIS — I10 Essential (primary) hypertension: Secondary | ICD-10-CM | POA: Diagnosis not present

## 2019-01-25 DIAGNOSIS — K219 Gastro-esophageal reflux disease without esophagitis: Secondary | ICD-10-CM | POA: Diagnosis not present

## 2019-01-25 DIAGNOSIS — I10 Essential (primary) hypertension: Secondary | ICD-10-CM | POA: Diagnosis not present

## 2019-01-25 DIAGNOSIS — E039 Hypothyroidism, unspecified: Secondary | ICD-10-CM | POA: Diagnosis not present

## 2019-01-25 DIAGNOSIS — D631 Anemia in chronic kidney disease: Secondary | ICD-10-CM | POA: Diagnosis not present

## 2019-01-25 DIAGNOSIS — R2689 Other abnormalities of gait and mobility: Secondary | ICD-10-CM | POA: Diagnosis not present

## 2019-01-25 DIAGNOSIS — I4891 Unspecified atrial fibrillation: Secondary | ICD-10-CM | POA: Diagnosis not present

## 2019-01-25 DIAGNOSIS — M069 Rheumatoid arthritis, unspecified: Secondary | ICD-10-CM | POA: Diagnosis not present

## 2019-01-25 DIAGNOSIS — R2681 Unsteadiness on feet: Secondary | ICD-10-CM | POA: Diagnosis not present

## 2019-01-25 DIAGNOSIS — M6281 Muscle weakness (generalized): Secondary | ICD-10-CM | POA: Diagnosis not present

## 2019-01-28 DIAGNOSIS — I10 Essential (primary) hypertension: Secondary | ICD-10-CM | POA: Diagnosis not present

## 2019-01-28 DIAGNOSIS — I4891 Unspecified atrial fibrillation: Secondary | ICD-10-CM | POA: Diagnosis not present

## 2019-01-28 DIAGNOSIS — R2681 Unsteadiness on feet: Secondary | ICD-10-CM | POA: Diagnosis not present

## 2019-01-28 DIAGNOSIS — K219 Gastro-esophageal reflux disease without esophagitis: Secondary | ICD-10-CM | POA: Diagnosis not present

## 2019-01-28 DIAGNOSIS — E039 Hypothyroidism, unspecified: Secondary | ICD-10-CM | POA: Diagnosis not present

## 2019-01-28 DIAGNOSIS — M6281 Muscle weakness (generalized): Secondary | ICD-10-CM | POA: Diagnosis not present

## 2019-01-28 DIAGNOSIS — R2689 Other abnormalities of gait and mobility: Secondary | ICD-10-CM | POA: Diagnosis not present

## 2019-01-28 DIAGNOSIS — M069 Rheumatoid arthritis, unspecified: Secondary | ICD-10-CM | POA: Diagnosis not present

## 2019-01-28 DIAGNOSIS — D631 Anemia in chronic kidney disease: Secondary | ICD-10-CM | POA: Diagnosis not present

## 2019-01-29 DIAGNOSIS — Z20828 Contact with and (suspected) exposure to other viral communicable diseases: Secondary | ICD-10-CM | POA: Diagnosis not present

## 2019-01-29 DIAGNOSIS — K219 Gastro-esophageal reflux disease without esophagitis: Secondary | ICD-10-CM | POA: Diagnosis not present

## 2019-01-29 DIAGNOSIS — I10 Essential (primary) hypertension: Secondary | ICD-10-CM | POA: Diagnosis not present

## 2019-01-29 DIAGNOSIS — R2681 Unsteadiness on feet: Secondary | ICD-10-CM | POA: Diagnosis not present

## 2019-01-29 DIAGNOSIS — M069 Rheumatoid arthritis, unspecified: Secondary | ICD-10-CM | POA: Diagnosis not present

## 2019-01-29 DIAGNOSIS — R2689 Other abnormalities of gait and mobility: Secondary | ICD-10-CM | POA: Diagnosis not present

## 2019-01-29 DIAGNOSIS — E039 Hypothyroidism, unspecified: Secondary | ICD-10-CM | POA: Diagnosis not present

## 2019-01-29 DIAGNOSIS — D631 Anemia in chronic kidney disease: Secondary | ICD-10-CM | POA: Diagnosis not present

## 2019-01-29 DIAGNOSIS — M6281 Muscle weakness (generalized): Secondary | ICD-10-CM | POA: Diagnosis not present

## 2019-01-29 DIAGNOSIS — I4891 Unspecified atrial fibrillation: Secondary | ICD-10-CM | POA: Diagnosis not present

## 2019-01-30 DIAGNOSIS — D631 Anemia in chronic kidney disease: Secondary | ICD-10-CM | POA: Diagnosis not present

## 2019-01-30 DIAGNOSIS — I10 Essential (primary) hypertension: Secondary | ICD-10-CM | POA: Diagnosis not present

## 2019-01-30 DIAGNOSIS — K219 Gastro-esophageal reflux disease without esophagitis: Secondary | ICD-10-CM | POA: Diagnosis not present

## 2019-01-30 DIAGNOSIS — R2689 Other abnormalities of gait and mobility: Secondary | ICD-10-CM | POA: Diagnosis not present

## 2019-01-30 DIAGNOSIS — M6281 Muscle weakness (generalized): Secondary | ICD-10-CM | POA: Diagnosis not present

## 2019-01-30 DIAGNOSIS — I4891 Unspecified atrial fibrillation: Secondary | ICD-10-CM | POA: Diagnosis not present

## 2019-01-30 DIAGNOSIS — M069 Rheumatoid arthritis, unspecified: Secondary | ICD-10-CM | POA: Diagnosis not present

## 2019-01-30 DIAGNOSIS — E039 Hypothyroidism, unspecified: Secondary | ICD-10-CM | POA: Diagnosis not present

## 2019-01-30 DIAGNOSIS — R2681 Unsteadiness on feet: Secondary | ICD-10-CM | POA: Diagnosis not present

## 2019-01-31 DIAGNOSIS — I10 Essential (primary) hypertension: Secondary | ICD-10-CM | POA: Diagnosis not present

## 2019-01-31 DIAGNOSIS — D631 Anemia in chronic kidney disease: Secondary | ICD-10-CM | POA: Diagnosis not present

## 2019-01-31 DIAGNOSIS — M069 Rheumatoid arthritis, unspecified: Secondary | ICD-10-CM | POA: Diagnosis not present

## 2019-01-31 DIAGNOSIS — R2681 Unsteadiness on feet: Secondary | ICD-10-CM | POA: Diagnosis not present

## 2019-01-31 DIAGNOSIS — K219 Gastro-esophageal reflux disease without esophagitis: Secondary | ICD-10-CM | POA: Diagnosis not present

## 2019-01-31 DIAGNOSIS — R2689 Other abnormalities of gait and mobility: Secondary | ICD-10-CM | POA: Diagnosis not present

## 2019-01-31 DIAGNOSIS — E039 Hypothyroidism, unspecified: Secondary | ICD-10-CM | POA: Diagnosis not present

## 2019-01-31 DIAGNOSIS — I4891 Unspecified atrial fibrillation: Secondary | ICD-10-CM | POA: Diagnosis not present

## 2019-01-31 DIAGNOSIS — M6281 Muscle weakness (generalized): Secondary | ICD-10-CM | POA: Diagnosis not present

## 2019-02-01 DIAGNOSIS — K219 Gastro-esophageal reflux disease without esophagitis: Secondary | ICD-10-CM | POA: Diagnosis not present

## 2019-02-01 DIAGNOSIS — R2689 Other abnormalities of gait and mobility: Secondary | ICD-10-CM | POA: Diagnosis not present

## 2019-02-01 DIAGNOSIS — R2681 Unsteadiness on feet: Secondary | ICD-10-CM | POA: Diagnosis not present

## 2019-02-01 DIAGNOSIS — M069 Rheumatoid arthritis, unspecified: Secondary | ICD-10-CM | POA: Diagnosis not present

## 2019-02-01 DIAGNOSIS — D631 Anemia in chronic kidney disease: Secondary | ICD-10-CM | POA: Diagnosis not present

## 2019-02-01 DIAGNOSIS — I10 Essential (primary) hypertension: Secondary | ICD-10-CM | POA: Diagnosis not present

## 2019-02-01 DIAGNOSIS — M6281 Muscle weakness (generalized): Secondary | ICD-10-CM | POA: Diagnosis not present

## 2019-02-01 DIAGNOSIS — I4891 Unspecified atrial fibrillation: Secondary | ICD-10-CM | POA: Diagnosis not present

## 2019-02-01 DIAGNOSIS — E039 Hypothyroidism, unspecified: Secondary | ICD-10-CM | POA: Diagnosis not present

## 2019-02-04 DIAGNOSIS — I4891 Unspecified atrial fibrillation: Secondary | ICD-10-CM | POA: Diagnosis not present

## 2019-02-04 DIAGNOSIS — R2689 Other abnormalities of gait and mobility: Secondary | ICD-10-CM | POA: Diagnosis not present

## 2019-02-04 DIAGNOSIS — D631 Anemia in chronic kidney disease: Secondary | ICD-10-CM | POA: Diagnosis not present

## 2019-02-04 DIAGNOSIS — E039 Hypothyroidism, unspecified: Secondary | ICD-10-CM | POA: Diagnosis not present

## 2019-02-04 DIAGNOSIS — K219 Gastro-esophageal reflux disease without esophagitis: Secondary | ICD-10-CM | POA: Diagnosis not present

## 2019-02-04 DIAGNOSIS — M069 Rheumatoid arthritis, unspecified: Secondary | ICD-10-CM | POA: Diagnosis not present

## 2019-02-04 DIAGNOSIS — I10 Essential (primary) hypertension: Secondary | ICD-10-CM | POA: Diagnosis not present

## 2019-02-04 DIAGNOSIS — M6281 Muscle weakness (generalized): Secondary | ICD-10-CM | POA: Diagnosis not present

## 2019-02-04 DIAGNOSIS — R2681 Unsteadiness on feet: Secondary | ICD-10-CM | POA: Diagnosis not present

## 2019-02-05 DIAGNOSIS — R2689 Other abnormalities of gait and mobility: Secondary | ICD-10-CM | POA: Diagnosis not present

## 2019-02-05 DIAGNOSIS — I4891 Unspecified atrial fibrillation: Secondary | ICD-10-CM | POA: Diagnosis not present

## 2019-02-05 DIAGNOSIS — R2681 Unsteadiness on feet: Secondary | ICD-10-CM | POA: Diagnosis not present

## 2019-02-05 DIAGNOSIS — K219 Gastro-esophageal reflux disease without esophagitis: Secondary | ICD-10-CM | POA: Diagnosis not present

## 2019-02-05 DIAGNOSIS — I10 Essential (primary) hypertension: Secondary | ICD-10-CM | POA: Diagnosis not present

## 2019-02-05 DIAGNOSIS — D631 Anemia in chronic kidney disease: Secondary | ICD-10-CM | POA: Diagnosis not present

## 2019-02-05 DIAGNOSIS — M069 Rheumatoid arthritis, unspecified: Secondary | ICD-10-CM | POA: Diagnosis not present

## 2019-02-05 DIAGNOSIS — E039 Hypothyroidism, unspecified: Secondary | ICD-10-CM | POA: Diagnosis not present

## 2019-02-05 DIAGNOSIS — Z20828 Contact with and (suspected) exposure to other viral communicable diseases: Secondary | ICD-10-CM | POA: Diagnosis not present

## 2019-02-05 DIAGNOSIS — M6281 Muscle weakness (generalized): Secondary | ICD-10-CM | POA: Diagnosis not present

## 2019-02-06 DIAGNOSIS — R2681 Unsteadiness on feet: Secondary | ICD-10-CM | POA: Diagnosis not present

## 2019-02-06 DIAGNOSIS — I10 Essential (primary) hypertension: Secondary | ICD-10-CM | POA: Diagnosis not present

## 2019-02-06 DIAGNOSIS — I4891 Unspecified atrial fibrillation: Secondary | ICD-10-CM | POA: Diagnosis not present

## 2019-02-06 DIAGNOSIS — E039 Hypothyroidism, unspecified: Secondary | ICD-10-CM | POA: Diagnosis not present

## 2019-02-06 DIAGNOSIS — M069 Rheumatoid arthritis, unspecified: Secondary | ICD-10-CM | POA: Diagnosis not present

## 2019-02-06 DIAGNOSIS — M6281 Muscle weakness (generalized): Secondary | ICD-10-CM | POA: Diagnosis not present

## 2019-02-06 DIAGNOSIS — D631 Anemia in chronic kidney disease: Secondary | ICD-10-CM | POA: Diagnosis not present

## 2019-02-06 DIAGNOSIS — K219 Gastro-esophageal reflux disease without esophagitis: Secondary | ICD-10-CM | POA: Diagnosis not present

## 2019-02-06 DIAGNOSIS — R2689 Other abnormalities of gait and mobility: Secondary | ICD-10-CM | POA: Diagnosis not present

## 2019-02-07 DIAGNOSIS — K219 Gastro-esophageal reflux disease without esophagitis: Secondary | ICD-10-CM | POA: Diagnosis not present

## 2019-02-07 DIAGNOSIS — R2689 Other abnormalities of gait and mobility: Secondary | ICD-10-CM | POA: Diagnosis not present

## 2019-02-07 DIAGNOSIS — R2681 Unsteadiness on feet: Secondary | ICD-10-CM | POA: Diagnosis not present

## 2019-02-07 DIAGNOSIS — I10 Essential (primary) hypertension: Secondary | ICD-10-CM | POA: Diagnosis not present

## 2019-02-07 DIAGNOSIS — E039 Hypothyroidism, unspecified: Secondary | ICD-10-CM | POA: Diagnosis not present

## 2019-02-07 DIAGNOSIS — I4891 Unspecified atrial fibrillation: Secondary | ICD-10-CM | POA: Diagnosis not present

## 2019-02-07 DIAGNOSIS — M069 Rheumatoid arthritis, unspecified: Secondary | ICD-10-CM | POA: Diagnosis not present

## 2019-02-07 DIAGNOSIS — M6281 Muscle weakness (generalized): Secondary | ICD-10-CM | POA: Diagnosis not present

## 2019-02-07 DIAGNOSIS — D631 Anemia in chronic kidney disease: Secondary | ICD-10-CM | POA: Diagnosis not present

## 2019-02-08 DIAGNOSIS — K219 Gastro-esophageal reflux disease without esophagitis: Secondary | ICD-10-CM | POA: Diagnosis not present

## 2019-02-08 DIAGNOSIS — D631 Anemia in chronic kidney disease: Secondary | ICD-10-CM | POA: Diagnosis not present

## 2019-02-08 DIAGNOSIS — E039 Hypothyroidism, unspecified: Secondary | ICD-10-CM | POA: Diagnosis not present

## 2019-02-08 DIAGNOSIS — R2689 Other abnormalities of gait and mobility: Secondary | ICD-10-CM | POA: Diagnosis not present

## 2019-02-08 DIAGNOSIS — I4891 Unspecified atrial fibrillation: Secondary | ICD-10-CM | POA: Diagnosis not present

## 2019-02-08 DIAGNOSIS — M6281 Muscle weakness (generalized): Secondary | ICD-10-CM | POA: Diagnosis not present

## 2019-02-08 DIAGNOSIS — I10 Essential (primary) hypertension: Secondary | ICD-10-CM | POA: Diagnosis not present

## 2019-02-08 DIAGNOSIS — R2681 Unsteadiness on feet: Secondary | ICD-10-CM | POA: Diagnosis not present

## 2019-02-08 DIAGNOSIS — M069 Rheumatoid arthritis, unspecified: Secondary | ICD-10-CM | POA: Diagnosis not present

## 2019-02-11 DIAGNOSIS — M069 Rheumatoid arthritis, unspecified: Secondary | ICD-10-CM | POA: Diagnosis not present

## 2019-02-11 DIAGNOSIS — I4891 Unspecified atrial fibrillation: Secondary | ICD-10-CM | POA: Diagnosis not present

## 2019-02-11 DIAGNOSIS — I10 Essential (primary) hypertension: Secondary | ICD-10-CM | POA: Diagnosis not present

## 2019-02-11 DIAGNOSIS — D631 Anemia in chronic kidney disease: Secondary | ICD-10-CM | POA: Diagnosis not present

## 2019-02-11 DIAGNOSIS — R2689 Other abnormalities of gait and mobility: Secondary | ICD-10-CM | POA: Diagnosis not present

## 2019-02-11 DIAGNOSIS — K219 Gastro-esophageal reflux disease without esophagitis: Secondary | ICD-10-CM | POA: Diagnosis not present

## 2019-02-11 DIAGNOSIS — M6281 Muscle weakness (generalized): Secondary | ICD-10-CM | POA: Diagnosis not present

## 2019-02-11 DIAGNOSIS — E039 Hypothyroidism, unspecified: Secondary | ICD-10-CM | POA: Diagnosis not present

## 2019-02-11 DIAGNOSIS — R2681 Unsteadiness on feet: Secondary | ICD-10-CM | POA: Diagnosis not present

## 2019-02-12 DIAGNOSIS — Z20828 Contact with and (suspected) exposure to other viral communicable diseases: Secondary | ICD-10-CM | POA: Diagnosis not present

## 2019-02-12 DIAGNOSIS — M6281 Muscle weakness (generalized): Secondary | ICD-10-CM | POA: Diagnosis not present

## 2019-02-12 DIAGNOSIS — D631 Anemia in chronic kidney disease: Secondary | ICD-10-CM | POA: Diagnosis not present

## 2019-02-12 DIAGNOSIS — F331 Major depressive disorder, recurrent, moderate: Secondary | ICD-10-CM | POA: Diagnosis not present

## 2019-02-12 DIAGNOSIS — R2689 Other abnormalities of gait and mobility: Secondary | ICD-10-CM | POA: Diagnosis not present

## 2019-02-12 DIAGNOSIS — I10 Essential (primary) hypertension: Secondary | ICD-10-CM | POA: Diagnosis not present

## 2019-02-12 DIAGNOSIS — G301 Alzheimer's disease with late onset: Secondary | ICD-10-CM | POA: Diagnosis not present

## 2019-02-12 DIAGNOSIS — M069 Rheumatoid arthritis, unspecified: Secondary | ICD-10-CM | POA: Diagnosis not present

## 2019-02-12 DIAGNOSIS — R2681 Unsteadiness on feet: Secondary | ICD-10-CM | POA: Diagnosis not present

## 2019-02-12 DIAGNOSIS — K219 Gastro-esophageal reflux disease without esophagitis: Secondary | ICD-10-CM | POA: Diagnosis not present

## 2019-02-12 DIAGNOSIS — F0281 Dementia in other diseases classified elsewhere with behavioral disturbance: Secondary | ICD-10-CM | POA: Diagnosis not present

## 2019-02-12 DIAGNOSIS — I4891 Unspecified atrial fibrillation: Secondary | ICD-10-CM | POA: Diagnosis not present

## 2019-02-12 DIAGNOSIS — E039 Hypothyroidism, unspecified: Secondary | ICD-10-CM | POA: Diagnosis not present

## 2019-02-13 DIAGNOSIS — E039 Hypothyroidism, unspecified: Secondary | ICD-10-CM | POA: Diagnosis not present

## 2019-02-13 DIAGNOSIS — D631 Anemia in chronic kidney disease: Secondary | ICD-10-CM | POA: Diagnosis not present

## 2019-02-13 DIAGNOSIS — I10 Essential (primary) hypertension: Secondary | ICD-10-CM | POA: Diagnosis not present

## 2019-02-13 DIAGNOSIS — K219 Gastro-esophageal reflux disease without esophagitis: Secondary | ICD-10-CM | POA: Diagnosis not present

## 2019-02-13 DIAGNOSIS — I4891 Unspecified atrial fibrillation: Secondary | ICD-10-CM | POA: Diagnosis not present

## 2019-02-13 DIAGNOSIS — R2681 Unsteadiness on feet: Secondary | ICD-10-CM | POA: Diagnosis not present

## 2019-02-13 DIAGNOSIS — M069 Rheumatoid arthritis, unspecified: Secondary | ICD-10-CM | POA: Diagnosis not present

## 2019-02-13 DIAGNOSIS — R2689 Other abnormalities of gait and mobility: Secondary | ICD-10-CM | POA: Diagnosis not present

## 2019-02-13 DIAGNOSIS — M6281 Muscle weakness (generalized): Secondary | ICD-10-CM | POA: Diagnosis not present

## 2019-02-14 DIAGNOSIS — M069 Rheumatoid arthritis, unspecified: Secondary | ICD-10-CM | POA: Diagnosis not present

## 2019-02-14 DIAGNOSIS — K219 Gastro-esophageal reflux disease without esophagitis: Secondary | ICD-10-CM | POA: Diagnosis not present

## 2019-02-14 DIAGNOSIS — R2689 Other abnormalities of gait and mobility: Secondary | ICD-10-CM | POA: Diagnosis not present

## 2019-02-14 DIAGNOSIS — M6281 Muscle weakness (generalized): Secondary | ICD-10-CM | POA: Diagnosis not present

## 2019-02-14 DIAGNOSIS — R2681 Unsteadiness on feet: Secondary | ICD-10-CM | POA: Diagnosis not present

## 2019-02-14 DIAGNOSIS — E039 Hypothyroidism, unspecified: Secondary | ICD-10-CM | POA: Diagnosis not present

## 2019-02-14 DIAGNOSIS — I10 Essential (primary) hypertension: Secondary | ICD-10-CM | POA: Diagnosis not present

## 2019-02-14 DIAGNOSIS — I4891 Unspecified atrial fibrillation: Secondary | ICD-10-CM | POA: Diagnosis not present

## 2019-02-14 DIAGNOSIS — D631 Anemia in chronic kidney disease: Secondary | ICD-10-CM | POA: Diagnosis not present

## 2019-02-15 DIAGNOSIS — D631 Anemia in chronic kidney disease: Secondary | ICD-10-CM | POA: Diagnosis not present

## 2019-02-15 DIAGNOSIS — M6281 Muscle weakness (generalized): Secondary | ICD-10-CM | POA: Diagnosis not present

## 2019-02-15 DIAGNOSIS — E039 Hypothyroidism, unspecified: Secondary | ICD-10-CM | POA: Diagnosis not present

## 2019-02-15 DIAGNOSIS — R2681 Unsteadiness on feet: Secondary | ICD-10-CM | POA: Diagnosis not present

## 2019-02-15 DIAGNOSIS — I10 Essential (primary) hypertension: Secondary | ICD-10-CM | POA: Diagnosis not present

## 2019-02-15 DIAGNOSIS — M069 Rheumatoid arthritis, unspecified: Secondary | ICD-10-CM | POA: Diagnosis not present

## 2019-02-15 DIAGNOSIS — R2689 Other abnormalities of gait and mobility: Secondary | ICD-10-CM | POA: Diagnosis not present

## 2019-02-15 DIAGNOSIS — K219 Gastro-esophageal reflux disease without esophagitis: Secondary | ICD-10-CM | POA: Diagnosis not present

## 2019-02-15 DIAGNOSIS — I4891 Unspecified atrial fibrillation: Secondary | ICD-10-CM | POA: Diagnosis not present

## 2019-02-18 DIAGNOSIS — M6281 Muscle weakness (generalized): Secondary | ICD-10-CM | POA: Diagnosis not present

## 2019-02-18 DIAGNOSIS — R2681 Unsteadiness on feet: Secondary | ICD-10-CM | POA: Diagnosis not present

## 2019-02-18 DIAGNOSIS — D631 Anemia in chronic kidney disease: Secondary | ICD-10-CM | POA: Diagnosis not present

## 2019-02-18 DIAGNOSIS — R2689 Other abnormalities of gait and mobility: Secondary | ICD-10-CM | POA: Diagnosis not present

## 2019-02-18 DIAGNOSIS — E039 Hypothyroidism, unspecified: Secondary | ICD-10-CM | POA: Diagnosis not present

## 2019-02-18 DIAGNOSIS — I10 Essential (primary) hypertension: Secondary | ICD-10-CM | POA: Diagnosis not present

## 2019-02-18 DIAGNOSIS — I4891 Unspecified atrial fibrillation: Secondary | ICD-10-CM | POA: Diagnosis not present

## 2019-02-18 DIAGNOSIS — M069 Rheumatoid arthritis, unspecified: Secondary | ICD-10-CM | POA: Diagnosis not present

## 2019-02-18 DIAGNOSIS — K219 Gastro-esophageal reflux disease without esophagitis: Secondary | ICD-10-CM | POA: Diagnosis not present

## 2019-02-19 DIAGNOSIS — K219 Gastro-esophageal reflux disease without esophagitis: Secondary | ICD-10-CM | POA: Diagnosis not present

## 2019-02-19 DIAGNOSIS — E039 Hypothyroidism, unspecified: Secondary | ICD-10-CM | POA: Diagnosis not present

## 2019-02-19 DIAGNOSIS — M069 Rheumatoid arthritis, unspecified: Secondary | ICD-10-CM | POA: Diagnosis not present

## 2019-02-19 DIAGNOSIS — I4891 Unspecified atrial fibrillation: Secondary | ICD-10-CM | POA: Diagnosis not present

## 2019-02-19 DIAGNOSIS — R2689 Other abnormalities of gait and mobility: Secondary | ICD-10-CM | POA: Diagnosis not present

## 2019-02-19 DIAGNOSIS — D631 Anemia in chronic kidney disease: Secondary | ICD-10-CM | POA: Diagnosis not present

## 2019-02-19 DIAGNOSIS — Z20828 Contact with and (suspected) exposure to other viral communicable diseases: Secondary | ICD-10-CM | POA: Diagnosis not present

## 2019-02-19 DIAGNOSIS — M6281 Muscle weakness (generalized): Secondary | ICD-10-CM | POA: Diagnosis not present

## 2019-02-19 DIAGNOSIS — R2681 Unsteadiness on feet: Secondary | ICD-10-CM | POA: Diagnosis not present

## 2019-02-19 DIAGNOSIS — I10 Essential (primary) hypertension: Secondary | ICD-10-CM | POA: Diagnosis not present

## 2019-02-20 DIAGNOSIS — I4891 Unspecified atrial fibrillation: Secondary | ICD-10-CM | POA: Diagnosis not present

## 2019-02-20 DIAGNOSIS — R2689 Other abnormalities of gait and mobility: Secondary | ICD-10-CM | POA: Diagnosis not present

## 2019-02-20 DIAGNOSIS — M6281 Muscle weakness (generalized): Secondary | ICD-10-CM | POA: Diagnosis not present

## 2019-02-20 DIAGNOSIS — D631 Anemia in chronic kidney disease: Secondary | ICD-10-CM | POA: Diagnosis not present

## 2019-02-20 DIAGNOSIS — R2681 Unsteadiness on feet: Secondary | ICD-10-CM | POA: Diagnosis not present

## 2019-02-20 DIAGNOSIS — I10 Essential (primary) hypertension: Secondary | ICD-10-CM | POA: Diagnosis not present

## 2019-02-20 DIAGNOSIS — M069 Rheumatoid arthritis, unspecified: Secondary | ICD-10-CM | POA: Diagnosis not present

## 2019-02-20 DIAGNOSIS — K219 Gastro-esophageal reflux disease without esophagitis: Secondary | ICD-10-CM | POA: Diagnosis not present

## 2019-02-20 DIAGNOSIS — E039 Hypothyroidism, unspecified: Secondary | ICD-10-CM | POA: Diagnosis not present

## 2019-02-21 DIAGNOSIS — R2681 Unsteadiness on feet: Secondary | ICD-10-CM | POA: Diagnosis not present

## 2019-02-21 DIAGNOSIS — M6281 Muscle weakness (generalized): Secondary | ICD-10-CM | POA: Diagnosis not present

## 2019-02-21 DIAGNOSIS — K219 Gastro-esophageal reflux disease without esophagitis: Secondary | ICD-10-CM | POA: Diagnosis not present

## 2019-02-21 DIAGNOSIS — I4891 Unspecified atrial fibrillation: Secondary | ICD-10-CM | POA: Diagnosis not present

## 2019-02-21 DIAGNOSIS — D631 Anemia in chronic kidney disease: Secondary | ICD-10-CM | POA: Diagnosis not present

## 2019-02-21 DIAGNOSIS — M069 Rheumatoid arthritis, unspecified: Secondary | ICD-10-CM | POA: Diagnosis not present

## 2019-02-21 DIAGNOSIS — R2689 Other abnormalities of gait and mobility: Secondary | ICD-10-CM | POA: Diagnosis not present

## 2019-02-21 DIAGNOSIS — E039 Hypothyroidism, unspecified: Secondary | ICD-10-CM | POA: Diagnosis not present

## 2019-02-21 DIAGNOSIS — I10 Essential (primary) hypertension: Secondary | ICD-10-CM | POA: Diagnosis not present

## 2019-02-22 DIAGNOSIS — M6281 Muscle weakness (generalized): Secondary | ICD-10-CM | POA: Diagnosis not present

## 2019-02-22 DIAGNOSIS — E039 Hypothyroidism, unspecified: Secondary | ICD-10-CM | POA: Diagnosis not present

## 2019-02-22 DIAGNOSIS — D631 Anemia in chronic kidney disease: Secondary | ICD-10-CM | POA: Diagnosis not present

## 2019-02-22 DIAGNOSIS — I10 Essential (primary) hypertension: Secondary | ICD-10-CM | POA: Diagnosis not present

## 2019-02-22 DIAGNOSIS — I482 Chronic atrial fibrillation, unspecified: Secondary | ICD-10-CM | POA: Diagnosis not present

## 2019-02-22 DIAGNOSIS — I5032 Chronic diastolic (congestive) heart failure: Secondary | ICD-10-CM | POA: Diagnosis not present

## 2019-02-22 DIAGNOSIS — R2689 Other abnormalities of gait and mobility: Secondary | ICD-10-CM | POA: Diagnosis not present

## 2019-02-22 DIAGNOSIS — I4891 Unspecified atrial fibrillation: Secondary | ICD-10-CM | POA: Diagnosis not present

## 2019-02-22 DIAGNOSIS — K219 Gastro-esophageal reflux disease without esophagitis: Secondary | ICD-10-CM | POA: Diagnosis not present

## 2019-02-22 DIAGNOSIS — I959 Hypotension, unspecified: Secondary | ICD-10-CM | POA: Diagnosis not present

## 2019-02-22 DIAGNOSIS — M069 Rheumatoid arthritis, unspecified: Secondary | ICD-10-CM | POA: Diagnosis not present

## 2019-02-22 DIAGNOSIS — R2681 Unsteadiness on feet: Secondary | ICD-10-CM | POA: Diagnosis not present

## 2019-02-25 DIAGNOSIS — M6281 Muscle weakness (generalized): Secondary | ICD-10-CM | POA: Diagnosis not present

## 2019-02-25 DIAGNOSIS — D631 Anemia in chronic kidney disease: Secondary | ICD-10-CM | POA: Diagnosis not present

## 2019-02-25 DIAGNOSIS — M069 Rheumatoid arthritis, unspecified: Secondary | ICD-10-CM | POA: Diagnosis not present

## 2019-02-25 DIAGNOSIS — R2689 Other abnormalities of gait and mobility: Secondary | ICD-10-CM | POA: Diagnosis not present

## 2019-02-25 DIAGNOSIS — I10 Essential (primary) hypertension: Secondary | ICD-10-CM | POA: Diagnosis not present

## 2019-02-25 DIAGNOSIS — K219 Gastro-esophageal reflux disease without esophagitis: Secondary | ICD-10-CM | POA: Diagnosis not present

## 2019-02-25 DIAGNOSIS — R2681 Unsteadiness on feet: Secondary | ICD-10-CM | POA: Diagnosis not present

## 2019-02-25 DIAGNOSIS — E039 Hypothyroidism, unspecified: Secondary | ICD-10-CM | POA: Diagnosis not present

## 2019-02-25 DIAGNOSIS — I4891 Unspecified atrial fibrillation: Secondary | ICD-10-CM | POA: Diagnosis not present

## 2019-02-26 DIAGNOSIS — M069 Rheumatoid arthritis, unspecified: Secondary | ICD-10-CM | POA: Diagnosis not present

## 2019-02-26 DIAGNOSIS — R2681 Unsteadiness on feet: Secondary | ICD-10-CM | POA: Diagnosis not present

## 2019-02-26 DIAGNOSIS — E039 Hypothyroidism, unspecified: Secondary | ICD-10-CM | POA: Diagnosis not present

## 2019-02-26 DIAGNOSIS — K219 Gastro-esophageal reflux disease without esophagitis: Secondary | ICD-10-CM | POA: Diagnosis not present

## 2019-02-26 DIAGNOSIS — Z20828 Contact with and (suspected) exposure to other viral communicable diseases: Secondary | ICD-10-CM | POA: Diagnosis not present

## 2019-02-26 DIAGNOSIS — I4891 Unspecified atrial fibrillation: Secondary | ICD-10-CM | POA: Diagnosis not present

## 2019-02-26 DIAGNOSIS — D631 Anemia in chronic kidney disease: Secondary | ICD-10-CM | POA: Diagnosis not present

## 2019-02-26 DIAGNOSIS — R2689 Other abnormalities of gait and mobility: Secondary | ICD-10-CM | POA: Diagnosis not present

## 2019-02-26 DIAGNOSIS — I10 Essential (primary) hypertension: Secondary | ICD-10-CM | POA: Diagnosis not present

## 2019-02-26 DIAGNOSIS — M6281 Muscle weakness (generalized): Secondary | ICD-10-CM | POA: Diagnosis not present

## 2019-02-27 DIAGNOSIS — I10 Essential (primary) hypertension: Secondary | ICD-10-CM | POA: Diagnosis not present

## 2019-02-27 DIAGNOSIS — E039 Hypothyroidism, unspecified: Secondary | ICD-10-CM | POA: Diagnosis not present

## 2019-02-27 DIAGNOSIS — M069 Rheumatoid arthritis, unspecified: Secondary | ICD-10-CM | POA: Diagnosis not present

## 2019-02-27 DIAGNOSIS — K219 Gastro-esophageal reflux disease without esophagitis: Secondary | ICD-10-CM | POA: Diagnosis not present

## 2019-02-27 DIAGNOSIS — M6281 Muscle weakness (generalized): Secondary | ICD-10-CM | POA: Diagnosis not present

## 2019-02-27 DIAGNOSIS — N189 Chronic kidney disease, unspecified: Secondary | ICD-10-CM | POA: Diagnosis not present

## 2019-02-27 DIAGNOSIS — R2689 Other abnormalities of gait and mobility: Secondary | ICD-10-CM | POA: Diagnosis not present

## 2019-02-27 DIAGNOSIS — D631 Anemia in chronic kidney disease: Secondary | ICD-10-CM | POA: Diagnosis not present

## 2019-02-27 DIAGNOSIS — R2681 Unsteadiness on feet: Secondary | ICD-10-CM | POA: Diagnosis not present

## 2019-02-27 DIAGNOSIS — I4891 Unspecified atrial fibrillation: Secondary | ICD-10-CM | POA: Diagnosis not present

## 2019-02-28 DIAGNOSIS — D631 Anemia in chronic kidney disease: Secondary | ICD-10-CM | POA: Diagnosis not present

## 2019-02-28 DIAGNOSIS — E039 Hypothyroidism, unspecified: Secondary | ICD-10-CM | POA: Diagnosis not present

## 2019-02-28 DIAGNOSIS — K219 Gastro-esophageal reflux disease without esophagitis: Secondary | ICD-10-CM | POA: Diagnosis not present

## 2019-02-28 DIAGNOSIS — M6281 Muscle weakness (generalized): Secondary | ICD-10-CM | POA: Diagnosis not present

## 2019-02-28 DIAGNOSIS — M069 Rheumatoid arthritis, unspecified: Secondary | ICD-10-CM | POA: Diagnosis not present

## 2019-02-28 DIAGNOSIS — R2681 Unsteadiness on feet: Secondary | ICD-10-CM | POA: Diagnosis not present

## 2019-02-28 DIAGNOSIS — I4891 Unspecified atrial fibrillation: Secondary | ICD-10-CM | POA: Diagnosis not present

## 2019-02-28 DIAGNOSIS — I10 Essential (primary) hypertension: Secondary | ICD-10-CM | POA: Diagnosis not present

## 2019-02-28 DIAGNOSIS — R2689 Other abnormalities of gait and mobility: Secondary | ICD-10-CM | POA: Diagnosis not present

## 2019-03-01 DIAGNOSIS — K219 Gastro-esophageal reflux disease without esophagitis: Secondary | ICD-10-CM | POA: Diagnosis not present

## 2019-03-01 DIAGNOSIS — I10 Essential (primary) hypertension: Secondary | ICD-10-CM | POA: Diagnosis not present

## 2019-03-01 DIAGNOSIS — E039 Hypothyroidism, unspecified: Secondary | ICD-10-CM | POA: Diagnosis not present

## 2019-03-01 DIAGNOSIS — D631 Anemia in chronic kidney disease: Secondary | ICD-10-CM | POA: Diagnosis not present

## 2019-03-01 DIAGNOSIS — I4891 Unspecified atrial fibrillation: Secondary | ICD-10-CM | POA: Diagnosis not present

## 2019-03-01 DIAGNOSIS — M6281 Muscle weakness (generalized): Secondary | ICD-10-CM | POA: Diagnosis not present

## 2019-03-01 DIAGNOSIS — R2681 Unsteadiness on feet: Secondary | ICD-10-CM | POA: Diagnosis not present

## 2019-03-01 DIAGNOSIS — R2689 Other abnormalities of gait and mobility: Secondary | ICD-10-CM | POA: Diagnosis not present

## 2019-03-01 DIAGNOSIS — M069 Rheumatoid arthritis, unspecified: Secondary | ICD-10-CM | POA: Diagnosis not present

## 2019-03-04 DIAGNOSIS — I10 Essential (primary) hypertension: Secondary | ICD-10-CM | POA: Diagnosis not present

## 2019-03-04 DIAGNOSIS — R2689 Other abnormalities of gait and mobility: Secondary | ICD-10-CM | POA: Diagnosis not present

## 2019-03-04 DIAGNOSIS — M6281 Muscle weakness (generalized): Secondary | ICD-10-CM | POA: Diagnosis not present

## 2019-03-04 DIAGNOSIS — K219 Gastro-esophageal reflux disease without esophagitis: Secondary | ICD-10-CM | POA: Diagnosis not present

## 2019-03-04 DIAGNOSIS — M069 Rheumatoid arthritis, unspecified: Secondary | ICD-10-CM | POA: Diagnosis not present

## 2019-03-04 DIAGNOSIS — R2681 Unsteadiness on feet: Secondary | ICD-10-CM | POA: Diagnosis not present

## 2019-03-04 DIAGNOSIS — I4891 Unspecified atrial fibrillation: Secondary | ICD-10-CM | POA: Diagnosis not present

## 2019-03-04 DIAGNOSIS — E039 Hypothyroidism, unspecified: Secondary | ICD-10-CM | POA: Diagnosis not present

## 2019-03-04 DIAGNOSIS — D631 Anemia in chronic kidney disease: Secondary | ICD-10-CM | POA: Diagnosis not present

## 2019-03-05 DIAGNOSIS — L859 Epidermal thickening, unspecified: Secondary | ICD-10-CM | POA: Diagnosis not present

## 2019-03-05 DIAGNOSIS — L602 Onychogryphosis: Secondary | ICD-10-CM | POA: Diagnosis not present

## 2019-03-05 DIAGNOSIS — R2689 Other abnormalities of gait and mobility: Secondary | ICD-10-CM | POA: Diagnosis not present

## 2019-03-05 DIAGNOSIS — R2681 Unsteadiness on feet: Secondary | ICD-10-CM | POA: Diagnosis not present

## 2019-03-05 DIAGNOSIS — Z20828 Contact with and (suspected) exposure to other viral communicable diseases: Secondary | ICD-10-CM | POA: Diagnosis not present

## 2019-03-05 DIAGNOSIS — M6281 Muscle weakness (generalized): Secondary | ICD-10-CM | POA: Diagnosis not present

## 2019-03-05 DIAGNOSIS — M069 Rheumatoid arthritis, unspecified: Secondary | ICD-10-CM | POA: Diagnosis not present

## 2019-03-05 DIAGNOSIS — K219 Gastro-esophageal reflux disease without esophagitis: Secondary | ICD-10-CM | POA: Diagnosis not present

## 2019-03-05 DIAGNOSIS — E039 Hypothyroidism, unspecified: Secondary | ICD-10-CM | POA: Diagnosis not present

## 2019-03-05 DIAGNOSIS — I4891 Unspecified atrial fibrillation: Secondary | ICD-10-CM | POA: Diagnosis not present

## 2019-03-05 DIAGNOSIS — I10 Essential (primary) hypertension: Secondary | ICD-10-CM | POA: Diagnosis not present

## 2019-03-05 DIAGNOSIS — E1142 Type 2 diabetes mellitus with diabetic polyneuropathy: Secondary | ICD-10-CM | POA: Diagnosis not present

## 2019-03-05 DIAGNOSIS — D631 Anemia in chronic kidney disease: Secondary | ICD-10-CM | POA: Diagnosis not present

## 2019-03-06 DIAGNOSIS — M6281 Muscle weakness (generalized): Secondary | ICD-10-CM | POA: Diagnosis not present

## 2019-03-06 DIAGNOSIS — K219 Gastro-esophageal reflux disease without esophagitis: Secondary | ICD-10-CM | POA: Diagnosis not present

## 2019-03-06 DIAGNOSIS — R2681 Unsteadiness on feet: Secondary | ICD-10-CM | POA: Diagnosis not present

## 2019-03-06 DIAGNOSIS — D631 Anemia in chronic kidney disease: Secondary | ICD-10-CM | POA: Diagnosis not present

## 2019-03-06 DIAGNOSIS — E039 Hypothyroidism, unspecified: Secondary | ICD-10-CM | POA: Diagnosis not present

## 2019-03-06 DIAGNOSIS — R2689 Other abnormalities of gait and mobility: Secondary | ICD-10-CM | POA: Diagnosis not present

## 2019-03-06 DIAGNOSIS — M069 Rheumatoid arthritis, unspecified: Secondary | ICD-10-CM | POA: Diagnosis not present

## 2019-03-06 DIAGNOSIS — I4891 Unspecified atrial fibrillation: Secondary | ICD-10-CM | POA: Diagnosis not present

## 2019-03-06 DIAGNOSIS — I10 Essential (primary) hypertension: Secondary | ICD-10-CM | POA: Diagnosis not present

## 2019-03-12 DIAGNOSIS — Z20828 Contact with and (suspected) exposure to other viral communicable diseases: Secondary | ICD-10-CM | POA: Diagnosis not present

## 2019-03-15 DIAGNOSIS — I482 Chronic atrial fibrillation, unspecified: Secondary | ICD-10-CM | POA: Diagnosis not present

## 2019-03-15 DIAGNOSIS — I10 Essential (primary) hypertension: Secondary | ICD-10-CM | POA: Diagnosis not present

## 2019-03-15 DIAGNOSIS — E119 Type 2 diabetes mellitus without complications: Secondary | ICD-10-CM | POA: Diagnosis not present

## 2019-03-15 DIAGNOSIS — M069 Rheumatoid arthritis, unspecified: Secondary | ICD-10-CM | POA: Diagnosis not present

## 2019-03-19 DIAGNOSIS — G301 Alzheimer's disease with late onset: Secondary | ICD-10-CM | POA: Diagnosis not present

## 2019-03-19 DIAGNOSIS — F0281 Dementia in other diseases classified elsewhere with behavioral disturbance: Secondary | ICD-10-CM | POA: Diagnosis not present

## 2019-03-19 DIAGNOSIS — F331 Major depressive disorder, recurrent, moderate: Secondary | ICD-10-CM | POA: Diagnosis not present

## 2019-03-30 DIAGNOSIS — I5032 Chronic diastolic (congestive) heart failure: Secondary | ICD-10-CM | POA: Diagnosis not present

## 2019-03-30 DIAGNOSIS — I959 Hypotension, unspecified: Secondary | ICD-10-CM | POA: Diagnosis not present

## 2019-03-30 DIAGNOSIS — F329 Major depressive disorder, single episode, unspecified: Secondary | ICD-10-CM | POA: Diagnosis not present

## 2019-03-30 DIAGNOSIS — M069 Rheumatoid arthritis, unspecified: Secondary | ICD-10-CM | POA: Diagnosis not present

## 2019-03-30 DIAGNOSIS — I4821 Permanent atrial fibrillation: Secondary | ICD-10-CM | POA: Diagnosis not present

## 2019-03-30 DIAGNOSIS — I10 Essential (primary) hypertension: Secondary | ICD-10-CM | POA: Diagnosis not present

## 2019-03-30 DIAGNOSIS — F0390 Unspecified dementia without behavioral disturbance: Secondary | ICD-10-CM | POA: Diagnosis not present

## 2019-04-01 ENCOUNTER — Telehealth: Payer: Self-pay | Admitting: Physical Therapy

## 2019-04-01 NOTE — Telephone Encounter (Signed)
Copied from [place] [id number]. Topic: General - Other >> Apr 01, 2019  3:23 PM Yvette Rack wrote: Reason for CRM: Pt daughter Michel Bickers requests to speak with Dr. Georgina Snell. Lavella Lemons stated that she needs to discuss Covid vaccine with Dr. Georgina Snell because the family would like to know how he feels about pt getting the Covid vaccine. [name] requests call back. Cb# [phone]

## 2019-04-01 NOTE — Telephone Encounter (Signed)
I called [name] back. I do recommend getting the vaccine. Discussed risk and benefits. Her risk of COVID-19 is high enough that the vaccine makes sense.

## 2019-04-03 DIAGNOSIS — Z20828 Contact with and (suspected) exposure to other viral communicable diseases: Secondary | ICD-10-CM | POA: Diagnosis not present

## 2019-04-04 DIAGNOSIS — G301 Alzheimer's disease with late onset: Secondary | ICD-10-CM | POA: Diagnosis not present

## 2019-04-04 DIAGNOSIS — F0632 Mood disorder due to known physiological condition with major depressive-like episode: Secondary | ICD-10-CM | POA: Diagnosis not present

## 2019-04-04 DIAGNOSIS — F0281 Dementia in other diseases classified elsewhere with behavioral disturbance: Secondary | ICD-10-CM | POA: Diagnosis not present

## 2019-04-08 DIAGNOSIS — Z20828 Contact with and (suspected) exposure to other viral communicable diseases: Secondary | ICD-10-CM | POA: Diagnosis not present

## 2019-04-15 DIAGNOSIS — Z20828 Contact with and (suspected) exposure to other viral communicable diseases: Secondary | ICD-10-CM | POA: Diagnosis not present

## 2019-04-16 DIAGNOSIS — R2689 Other abnormalities of gait and mobility: Secondary | ICD-10-CM | POA: Diagnosis not present

## 2019-04-16 DIAGNOSIS — I10 Essential (primary) hypertension: Secondary | ICD-10-CM | POA: Diagnosis not present

## 2019-04-16 DIAGNOSIS — M069 Rheumatoid arthritis, unspecified: Secondary | ICD-10-CM | POA: Diagnosis not present

## 2019-04-16 DIAGNOSIS — E039 Hypothyroidism, unspecified: Secondary | ICD-10-CM | POA: Diagnosis not present

## 2019-04-16 DIAGNOSIS — U071 COVID-19: Secondary | ICD-10-CM | POA: Diagnosis not present

## 2019-04-16 DIAGNOSIS — E1122 Type 2 diabetes mellitus with diabetic chronic kidney disease: Secondary | ICD-10-CM | POA: Diagnosis not present

## 2019-04-16 DIAGNOSIS — R2681 Unsteadiness on feet: Secondary | ICD-10-CM | POA: Diagnosis not present

## 2019-04-16 DIAGNOSIS — K219 Gastro-esophageal reflux disease without esophagitis: Secondary | ICD-10-CM | POA: Diagnosis not present

## 2019-04-16 DIAGNOSIS — I4891 Unspecified atrial fibrillation: Secondary | ICD-10-CM | POA: Diagnosis not present

## 2019-04-17 DIAGNOSIS — R2689 Other abnormalities of gait and mobility: Secondary | ICD-10-CM | POA: Diagnosis not present

## 2019-04-17 DIAGNOSIS — M069 Rheumatoid arthritis, unspecified: Secondary | ICD-10-CM | POA: Diagnosis not present

## 2019-04-17 DIAGNOSIS — U071 COVID-19: Secondary | ICD-10-CM | POA: Diagnosis not present

## 2019-04-17 DIAGNOSIS — K219 Gastro-esophageal reflux disease without esophagitis: Secondary | ICD-10-CM | POA: Diagnosis not present

## 2019-04-17 DIAGNOSIS — R2681 Unsteadiness on feet: Secondary | ICD-10-CM | POA: Diagnosis not present

## 2019-04-17 DIAGNOSIS — I10 Essential (primary) hypertension: Secondary | ICD-10-CM | POA: Diagnosis not present

## 2019-04-17 DIAGNOSIS — E1122 Type 2 diabetes mellitus with diabetic chronic kidney disease: Secondary | ICD-10-CM | POA: Diagnosis not present

## 2019-04-17 DIAGNOSIS — E039 Hypothyroidism, unspecified: Secondary | ICD-10-CM | POA: Diagnosis not present

## 2019-04-17 DIAGNOSIS — I4891 Unspecified atrial fibrillation: Secondary | ICD-10-CM | POA: Diagnosis not present

## 2019-04-18 DIAGNOSIS — M069 Rheumatoid arthritis, unspecified: Secondary | ICD-10-CM | POA: Diagnosis not present

## 2019-04-18 DIAGNOSIS — K219 Gastro-esophageal reflux disease without esophagitis: Secondary | ICD-10-CM | POA: Diagnosis not present

## 2019-04-18 DIAGNOSIS — U071 COVID-19: Secondary | ICD-10-CM | POA: Diagnosis not present

## 2019-04-18 DIAGNOSIS — R2681 Unsteadiness on feet: Secondary | ICD-10-CM | POA: Diagnosis not present

## 2019-04-18 DIAGNOSIS — I4891 Unspecified atrial fibrillation: Secondary | ICD-10-CM | POA: Diagnosis not present

## 2019-04-18 DIAGNOSIS — I10 Essential (primary) hypertension: Secondary | ICD-10-CM | POA: Diagnosis not present

## 2019-04-18 DIAGNOSIS — E039 Hypothyroidism, unspecified: Secondary | ICD-10-CM | POA: Diagnosis not present

## 2019-04-18 DIAGNOSIS — R2689 Other abnormalities of gait and mobility: Secondary | ICD-10-CM | POA: Diagnosis not present

## 2019-04-18 DIAGNOSIS — E1122 Type 2 diabetes mellitus with diabetic chronic kidney disease: Secondary | ICD-10-CM | POA: Diagnosis not present

## 2019-04-19 DIAGNOSIS — I482 Chronic atrial fibrillation, unspecified: Secondary | ICD-10-CM | POA: Diagnosis not present

## 2019-04-19 DIAGNOSIS — K219 Gastro-esophageal reflux disease without esophagitis: Secondary | ICD-10-CM | POA: Diagnosis not present

## 2019-04-19 DIAGNOSIS — U071 COVID-19: Secondary | ICD-10-CM | POA: Diagnosis not present

## 2019-04-19 DIAGNOSIS — I10 Essential (primary) hypertension: Secondary | ICD-10-CM | POA: Diagnosis not present

## 2019-04-19 DIAGNOSIS — R2681 Unsteadiness on feet: Secondary | ICD-10-CM | POA: Diagnosis not present

## 2019-04-19 DIAGNOSIS — R2689 Other abnormalities of gait and mobility: Secondary | ICD-10-CM | POA: Diagnosis not present

## 2019-04-19 DIAGNOSIS — E1122 Type 2 diabetes mellitus with diabetic chronic kidney disease: Secondary | ICD-10-CM | POA: Diagnosis not present

## 2019-04-19 DIAGNOSIS — I4891 Unspecified atrial fibrillation: Secondary | ICD-10-CM | POA: Diagnosis not present

## 2019-04-19 DIAGNOSIS — M069 Rheumatoid arthritis, unspecified: Secondary | ICD-10-CM | POA: Diagnosis not present

## 2019-04-19 DIAGNOSIS — I5032 Chronic diastolic (congestive) heart failure: Secondary | ICD-10-CM | POA: Diagnosis not present

## 2019-04-19 DIAGNOSIS — E039 Hypothyroidism, unspecified: Secondary | ICD-10-CM | POA: Diagnosis not present

## 2019-04-19 DIAGNOSIS — S81812D Laceration without foreign body, left lower leg, subsequent encounter: Secondary | ICD-10-CM | POA: Diagnosis not present

## 2019-04-22 DIAGNOSIS — R2689 Other abnormalities of gait and mobility: Secondary | ICD-10-CM | POA: Diagnosis not present

## 2019-04-22 DIAGNOSIS — E1122 Type 2 diabetes mellitus with diabetic chronic kidney disease: Secondary | ICD-10-CM | POA: Diagnosis not present

## 2019-04-22 DIAGNOSIS — E039 Hypothyroidism, unspecified: Secondary | ICD-10-CM | POA: Diagnosis not present

## 2019-04-22 DIAGNOSIS — M069 Rheumatoid arthritis, unspecified: Secondary | ICD-10-CM | POA: Diagnosis not present

## 2019-04-22 DIAGNOSIS — U071 COVID-19: Secondary | ICD-10-CM | POA: Diagnosis not present

## 2019-04-22 DIAGNOSIS — I4891 Unspecified atrial fibrillation: Secondary | ICD-10-CM | POA: Diagnosis not present

## 2019-04-22 DIAGNOSIS — R2681 Unsteadiness on feet: Secondary | ICD-10-CM | POA: Diagnosis not present

## 2019-04-22 DIAGNOSIS — K219 Gastro-esophageal reflux disease without esophagitis: Secondary | ICD-10-CM | POA: Diagnosis not present

## 2019-04-22 DIAGNOSIS — Z20828 Contact with and (suspected) exposure to other viral communicable diseases: Secondary | ICD-10-CM | POA: Diagnosis not present

## 2019-04-22 DIAGNOSIS — I10 Essential (primary) hypertension: Secondary | ICD-10-CM | POA: Diagnosis not present

## 2019-04-23 DIAGNOSIS — U071 COVID-19: Secondary | ICD-10-CM | POA: Diagnosis not present

## 2019-04-23 DIAGNOSIS — E1122 Type 2 diabetes mellitus with diabetic chronic kidney disease: Secondary | ICD-10-CM | POA: Diagnosis not present

## 2019-04-23 DIAGNOSIS — R2681 Unsteadiness on feet: Secondary | ICD-10-CM | POA: Diagnosis not present

## 2019-04-23 DIAGNOSIS — I4891 Unspecified atrial fibrillation: Secondary | ICD-10-CM | POA: Diagnosis not present

## 2019-04-23 DIAGNOSIS — M069 Rheumatoid arthritis, unspecified: Secondary | ICD-10-CM | POA: Diagnosis not present

## 2019-04-23 DIAGNOSIS — K219 Gastro-esophageal reflux disease without esophagitis: Secondary | ICD-10-CM | POA: Diagnosis not present

## 2019-04-23 DIAGNOSIS — E039 Hypothyroidism, unspecified: Secondary | ICD-10-CM | POA: Diagnosis not present

## 2019-04-23 DIAGNOSIS — I10 Essential (primary) hypertension: Secondary | ICD-10-CM | POA: Diagnosis not present

## 2019-04-23 DIAGNOSIS — R2689 Other abnormalities of gait and mobility: Secondary | ICD-10-CM | POA: Diagnosis not present

## 2019-04-24 DIAGNOSIS — E039 Hypothyroidism, unspecified: Secondary | ICD-10-CM | POA: Diagnosis not present

## 2019-04-24 DIAGNOSIS — E1122 Type 2 diabetes mellitus with diabetic chronic kidney disease: Secondary | ICD-10-CM | POA: Diagnosis not present

## 2019-04-24 DIAGNOSIS — M069 Rheumatoid arthritis, unspecified: Secondary | ICD-10-CM | POA: Diagnosis not present

## 2019-04-24 DIAGNOSIS — R2689 Other abnormalities of gait and mobility: Secondary | ICD-10-CM | POA: Diagnosis not present

## 2019-04-24 DIAGNOSIS — U071 COVID-19: Secondary | ICD-10-CM | POA: Diagnosis not present

## 2019-04-24 DIAGNOSIS — I10 Essential (primary) hypertension: Secondary | ICD-10-CM | POA: Diagnosis not present

## 2019-04-24 DIAGNOSIS — I4891 Unspecified atrial fibrillation: Secondary | ICD-10-CM | POA: Diagnosis not present

## 2019-04-24 DIAGNOSIS — R2681 Unsteadiness on feet: Secondary | ICD-10-CM | POA: Diagnosis not present

## 2019-04-24 DIAGNOSIS — K219 Gastro-esophageal reflux disease without esophagitis: Secondary | ICD-10-CM | POA: Diagnosis not present

## 2019-04-25 DIAGNOSIS — I4891 Unspecified atrial fibrillation: Secondary | ICD-10-CM | POA: Diagnosis not present

## 2019-04-25 DIAGNOSIS — R2689 Other abnormalities of gait and mobility: Secondary | ICD-10-CM | POA: Diagnosis not present

## 2019-04-25 DIAGNOSIS — I482 Chronic atrial fibrillation, unspecified: Secondary | ICD-10-CM | POA: Diagnosis not present

## 2019-04-25 DIAGNOSIS — I5032 Chronic diastolic (congestive) heart failure: Secondary | ICD-10-CM | POA: Diagnosis not present

## 2019-04-25 DIAGNOSIS — U071 COVID-19: Secondary | ICD-10-CM | POA: Diagnosis not present

## 2019-04-25 DIAGNOSIS — E1122 Type 2 diabetes mellitus with diabetic chronic kidney disease: Secondary | ICD-10-CM | POA: Diagnosis not present

## 2019-04-25 DIAGNOSIS — I10 Essential (primary) hypertension: Secondary | ICD-10-CM | POA: Diagnosis not present

## 2019-04-25 DIAGNOSIS — K219 Gastro-esophageal reflux disease without esophagitis: Secondary | ICD-10-CM | POA: Diagnosis not present

## 2019-04-25 DIAGNOSIS — M069 Rheumatoid arthritis, unspecified: Secondary | ICD-10-CM | POA: Diagnosis not present

## 2019-04-25 DIAGNOSIS — R2681 Unsteadiness on feet: Secondary | ICD-10-CM | POA: Diagnosis not present

## 2019-04-25 DIAGNOSIS — E039 Hypothyroidism, unspecified: Secondary | ICD-10-CM | POA: Diagnosis not present

## 2019-04-26 DIAGNOSIS — M069 Rheumatoid arthritis, unspecified: Secondary | ICD-10-CM | POA: Diagnosis not present

## 2019-04-26 DIAGNOSIS — E1122 Type 2 diabetes mellitus with diabetic chronic kidney disease: Secondary | ICD-10-CM | POA: Diagnosis not present

## 2019-04-26 DIAGNOSIS — R2681 Unsteadiness on feet: Secondary | ICD-10-CM | POA: Diagnosis not present

## 2019-04-26 DIAGNOSIS — I10 Essential (primary) hypertension: Secondary | ICD-10-CM | POA: Diagnosis not present

## 2019-04-26 DIAGNOSIS — R2689 Other abnormalities of gait and mobility: Secondary | ICD-10-CM | POA: Diagnosis not present

## 2019-04-26 DIAGNOSIS — I4891 Unspecified atrial fibrillation: Secondary | ICD-10-CM | POA: Diagnosis not present

## 2019-04-26 DIAGNOSIS — K219 Gastro-esophageal reflux disease without esophagitis: Secondary | ICD-10-CM | POA: Diagnosis not present

## 2019-04-26 DIAGNOSIS — U071 COVID-19: Secondary | ICD-10-CM | POA: Diagnosis not present

## 2019-04-26 DIAGNOSIS — E039 Hypothyroidism, unspecified: Secondary | ICD-10-CM | POA: Diagnosis not present

## 2019-04-29 DIAGNOSIS — E039 Hypothyroidism, unspecified: Secondary | ICD-10-CM | POA: Diagnosis not present

## 2019-04-29 DIAGNOSIS — K219 Gastro-esophageal reflux disease without esophagitis: Secondary | ICD-10-CM | POA: Diagnosis not present

## 2019-04-29 DIAGNOSIS — M069 Rheumatoid arthritis, unspecified: Secondary | ICD-10-CM | POA: Diagnosis not present

## 2019-04-29 DIAGNOSIS — I4891 Unspecified atrial fibrillation: Secondary | ICD-10-CM | POA: Diagnosis not present

## 2019-04-29 DIAGNOSIS — I10 Essential (primary) hypertension: Secondary | ICD-10-CM | POA: Diagnosis not present

## 2019-04-29 DIAGNOSIS — R2689 Other abnormalities of gait and mobility: Secondary | ICD-10-CM | POA: Diagnosis not present

## 2019-04-29 DIAGNOSIS — E1122 Type 2 diabetes mellitus with diabetic chronic kidney disease: Secondary | ICD-10-CM | POA: Diagnosis not present

## 2019-04-29 DIAGNOSIS — U071 COVID-19: Secondary | ICD-10-CM | POA: Diagnosis not present

## 2019-04-29 DIAGNOSIS — R2681 Unsteadiness on feet: Secondary | ICD-10-CM | POA: Diagnosis not present

## 2019-04-30 DIAGNOSIS — I10 Essential (primary) hypertension: Secondary | ICD-10-CM | POA: Diagnosis not present

## 2019-04-30 DIAGNOSIS — R2681 Unsteadiness on feet: Secondary | ICD-10-CM | POA: Diagnosis not present

## 2019-04-30 DIAGNOSIS — K219 Gastro-esophageal reflux disease without esophagitis: Secondary | ICD-10-CM | POA: Diagnosis not present

## 2019-04-30 DIAGNOSIS — M069 Rheumatoid arthritis, unspecified: Secondary | ICD-10-CM | POA: Diagnosis not present

## 2019-04-30 DIAGNOSIS — I4891 Unspecified atrial fibrillation: Secondary | ICD-10-CM | POA: Diagnosis not present

## 2019-04-30 DIAGNOSIS — R2689 Other abnormalities of gait and mobility: Secondary | ICD-10-CM | POA: Diagnosis not present

## 2019-04-30 DIAGNOSIS — E1122 Type 2 diabetes mellitus with diabetic chronic kidney disease: Secondary | ICD-10-CM | POA: Diagnosis not present

## 2019-04-30 DIAGNOSIS — E039 Hypothyroidism, unspecified: Secondary | ICD-10-CM | POA: Diagnosis not present

## 2019-04-30 DIAGNOSIS — U071 COVID-19: Secondary | ICD-10-CM | POA: Diagnosis not present

## 2019-05-01 DIAGNOSIS — M069 Rheumatoid arthritis, unspecified: Secondary | ICD-10-CM | POA: Diagnosis not present

## 2019-05-01 DIAGNOSIS — K219 Gastro-esophageal reflux disease without esophagitis: Secondary | ICD-10-CM | POA: Diagnosis not present

## 2019-05-01 DIAGNOSIS — E039 Hypothyroidism, unspecified: Secondary | ICD-10-CM | POA: Diagnosis not present

## 2019-05-01 DIAGNOSIS — I4891 Unspecified atrial fibrillation: Secondary | ICD-10-CM | POA: Diagnosis not present

## 2019-05-01 DIAGNOSIS — N183 Chronic kidney disease, stage 3 unspecified: Secondary | ICD-10-CM | POA: Diagnosis not present

## 2019-05-01 DIAGNOSIS — I10 Essential (primary) hypertension: Secondary | ICD-10-CM | POA: Diagnosis not present

## 2019-05-01 DIAGNOSIS — U071 COVID-19: Secondary | ICD-10-CM | POA: Diagnosis not present

## 2019-05-01 DIAGNOSIS — E1122 Type 2 diabetes mellitus with diabetic chronic kidney disease: Secondary | ICD-10-CM | POA: Diagnosis not present

## 2019-05-01 DIAGNOSIS — R2689 Other abnormalities of gait and mobility: Secondary | ICD-10-CM | POA: Diagnosis not present

## 2019-05-01 DIAGNOSIS — R2681 Unsteadiness on feet: Secondary | ICD-10-CM | POA: Diagnosis not present

## 2019-05-02 DIAGNOSIS — E1122 Type 2 diabetes mellitus with diabetic chronic kidney disease: Secondary | ICD-10-CM | POA: Diagnosis not present

## 2019-05-02 DIAGNOSIS — I10 Essential (primary) hypertension: Secondary | ICD-10-CM | POA: Diagnosis not present

## 2019-05-02 DIAGNOSIS — M069 Rheumatoid arthritis, unspecified: Secondary | ICD-10-CM | POA: Diagnosis not present

## 2019-05-02 DIAGNOSIS — R2689 Other abnormalities of gait and mobility: Secondary | ICD-10-CM | POA: Diagnosis not present

## 2019-05-02 DIAGNOSIS — R2681 Unsteadiness on feet: Secondary | ICD-10-CM | POA: Diagnosis not present

## 2019-05-02 DIAGNOSIS — U071 COVID-19: Secondary | ICD-10-CM | POA: Diagnosis not present

## 2019-05-02 DIAGNOSIS — K219 Gastro-esophageal reflux disease without esophagitis: Secondary | ICD-10-CM | POA: Diagnosis not present

## 2019-05-02 DIAGNOSIS — I4891 Unspecified atrial fibrillation: Secondary | ICD-10-CM | POA: Diagnosis not present

## 2019-05-02 DIAGNOSIS — E039 Hypothyroidism, unspecified: Secondary | ICD-10-CM | POA: Diagnosis not present

## 2019-05-03 DIAGNOSIS — I10 Essential (primary) hypertension: Secondary | ICD-10-CM | POA: Diagnosis not present

## 2019-05-03 DIAGNOSIS — M069 Rheumatoid arthritis, unspecified: Secondary | ICD-10-CM | POA: Diagnosis not present

## 2019-05-03 DIAGNOSIS — E1122 Type 2 diabetes mellitus with diabetic chronic kidney disease: Secondary | ICD-10-CM | POA: Diagnosis not present

## 2019-05-03 DIAGNOSIS — U071 COVID-19: Secondary | ICD-10-CM | POA: Diagnosis not present

## 2019-05-03 DIAGNOSIS — K219 Gastro-esophageal reflux disease without esophagitis: Secondary | ICD-10-CM | POA: Diagnosis not present

## 2019-05-03 DIAGNOSIS — R2681 Unsteadiness on feet: Secondary | ICD-10-CM | POA: Diagnosis not present

## 2019-05-03 DIAGNOSIS — I4891 Unspecified atrial fibrillation: Secondary | ICD-10-CM | POA: Diagnosis not present

## 2019-05-03 DIAGNOSIS — R2689 Other abnormalities of gait and mobility: Secondary | ICD-10-CM | POA: Diagnosis not present

## 2019-05-03 DIAGNOSIS — E039 Hypothyroidism, unspecified: Secondary | ICD-10-CM | POA: Diagnosis not present

## 2019-05-06 DIAGNOSIS — E039 Hypothyroidism, unspecified: Secondary | ICD-10-CM | POA: Diagnosis not present

## 2019-05-06 DIAGNOSIS — K219 Gastro-esophageal reflux disease without esophagitis: Secondary | ICD-10-CM | POA: Diagnosis not present

## 2019-05-06 DIAGNOSIS — I10 Essential (primary) hypertension: Secondary | ICD-10-CM | POA: Diagnosis not present

## 2019-05-06 DIAGNOSIS — U071 COVID-19: Secondary | ICD-10-CM | POA: Diagnosis not present

## 2019-05-06 DIAGNOSIS — I482 Chronic atrial fibrillation, unspecified: Secondary | ICD-10-CM | POA: Diagnosis not present

## 2019-05-06 DIAGNOSIS — E1122 Type 2 diabetes mellitus with diabetic chronic kidney disease: Secondary | ICD-10-CM | POA: Diagnosis not present

## 2019-05-06 DIAGNOSIS — I5032 Chronic diastolic (congestive) heart failure: Secondary | ICD-10-CM | POA: Diagnosis not present

## 2019-05-06 DIAGNOSIS — R2681 Unsteadiness on feet: Secondary | ICD-10-CM | POA: Diagnosis not present

## 2019-05-06 DIAGNOSIS — M069 Rheumatoid arthritis, unspecified: Secondary | ICD-10-CM | POA: Diagnosis not present

## 2019-05-06 DIAGNOSIS — I4891 Unspecified atrial fibrillation: Secondary | ICD-10-CM | POA: Diagnosis not present

## 2019-05-06 DIAGNOSIS — R2689 Other abnormalities of gait and mobility: Secondary | ICD-10-CM | POA: Diagnosis not present

## 2019-05-08 DIAGNOSIS — R2681 Unsteadiness on feet: Secondary | ICD-10-CM | POA: Diagnosis not present

## 2019-05-08 DIAGNOSIS — U071 COVID-19: Secondary | ICD-10-CM | POA: Diagnosis not present

## 2019-05-08 DIAGNOSIS — K219 Gastro-esophageal reflux disease without esophagitis: Secondary | ICD-10-CM | POA: Diagnosis not present

## 2019-05-08 DIAGNOSIS — R2689 Other abnormalities of gait and mobility: Secondary | ICD-10-CM | POA: Diagnosis not present

## 2019-05-08 DIAGNOSIS — I10 Essential (primary) hypertension: Secondary | ICD-10-CM | POA: Diagnosis not present

## 2019-05-08 DIAGNOSIS — E1122 Type 2 diabetes mellitus with diabetic chronic kidney disease: Secondary | ICD-10-CM | POA: Diagnosis not present

## 2019-05-08 DIAGNOSIS — M069 Rheumatoid arthritis, unspecified: Secondary | ICD-10-CM | POA: Diagnosis not present

## 2019-05-08 DIAGNOSIS — I4891 Unspecified atrial fibrillation: Secondary | ICD-10-CM | POA: Diagnosis not present

## 2019-05-08 DIAGNOSIS — E039 Hypothyroidism, unspecified: Secondary | ICD-10-CM | POA: Diagnosis not present

## 2019-05-09 DIAGNOSIS — U071 COVID-19: Secondary | ICD-10-CM | POA: Diagnosis not present

## 2019-05-09 DIAGNOSIS — I10 Essential (primary) hypertension: Secondary | ICD-10-CM | POA: Diagnosis not present

## 2019-05-09 DIAGNOSIS — K219 Gastro-esophageal reflux disease without esophagitis: Secondary | ICD-10-CM | POA: Diagnosis not present

## 2019-05-09 DIAGNOSIS — M069 Rheumatoid arthritis, unspecified: Secondary | ICD-10-CM | POA: Diagnosis not present

## 2019-05-09 DIAGNOSIS — R2681 Unsteadiness on feet: Secondary | ICD-10-CM | POA: Diagnosis not present

## 2019-05-09 DIAGNOSIS — E1122 Type 2 diabetes mellitus with diabetic chronic kidney disease: Secondary | ICD-10-CM | POA: Diagnosis not present

## 2019-05-09 DIAGNOSIS — I4891 Unspecified atrial fibrillation: Secondary | ICD-10-CM | POA: Diagnosis not present

## 2019-05-09 DIAGNOSIS — R2689 Other abnormalities of gait and mobility: Secondary | ICD-10-CM | POA: Diagnosis not present

## 2019-05-09 DIAGNOSIS — E039 Hypothyroidism, unspecified: Secondary | ICD-10-CM | POA: Diagnosis not present

## 2019-05-10 DIAGNOSIS — R2681 Unsteadiness on feet: Secondary | ICD-10-CM | POA: Diagnosis not present

## 2019-05-10 DIAGNOSIS — M069 Rheumatoid arthritis, unspecified: Secondary | ICD-10-CM | POA: Diagnosis not present

## 2019-05-10 DIAGNOSIS — I10 Essential (primary) hypertension: Secondary | ICD-10-CM | POA: Diagnosis not present

## 2019-05-10 DIAGNOSIS — I4891 Unspecified atrial fibrillation: Secondary | ICD-10-CM | POA: Diagnosis not present

## 2019-05-10 DIAGNOSIS — R2689 Other abnormalities of gait and mobility: Secondary | ICD-10-CM | POA: Diagnosis not present

## 2019-05-10 DIAGNOSIS — E039 Hypothyroidism, unspecified: Secondary | ICD-10-CM | POA: Diagnosis not present

## 2019-05-10 DIAGNOSIS — K219 Gastro-esophageal reflux disease without esophagitis: Secondary | ICD-10-CM | POA: Diagnosis not present

## 2019-05-10 DIAGNOSIS — U071 COVID-19: Secondary | ICD-10-CM | POA: Diagnosis not present

## 2019-05-10 DIAGNOSIS — E1122 Type 2 diabetes mellitus with diabetic chronic kidney disease: Secondary | ICD-10-CM | POA: Diagnosis not present

## 2019-05-13 DIAGNOSIS — I10 Essential (primary) hypertension: Secondary | ICD-10-CM | POA: Diagnosis not present

## 2019-05-13 DIAGNOSIS — E039 Hypothyroidism, unspecified: Secondary | ICD-10-CM | POA: Diagnosis not present

## 2019-05-13 DIAGNOSIS — R2681 Unsteadiness on feet: Secondary | ICD-10-CM | POA: Diagnosis not present

## 2019-05-13 DIAGNOSIS — R2689 Other abnormalities of gait and mobility: Secondary | ICD-10-CM | POA: Diagnosis not present

## 2019-05-13 DIAGNOSIS — I4891 Unspecified atrial fibrillation: Secondary | ICD-10-CM | POA: Diagnosis not present

## 2019-05-13 DIAGNOSIS — M069 Rheumatoid arthritis, unspecified: Secondary | ICD-10-CM | POA: Diagnosis not present

## 2019-05-13 DIAGNOSIS — U071 COVID-19: Secondary | ICD-10-CM | POA: Diagnosis not present

## 2019-05-13 DIAGNOSIS — K219 Gastro-esophageal reflux disease without esophagitis: Secondary | ICD-10-CM | POA: Diagnosis not present

## 2019-05-13 DIAGNOSIS — E1122 Type 2 diabetes mellitus with diabetic chronic kidney disease: Secondary | ICD-10-CM | POA: Diagnosis not present

## 2019-05-14 DIAGNOSIS — K219 Gastro-esophageal reflux disease without esophagitis: Secondary | ICD-10-CM | POA: Diagnosis not present

## 2019-05-14 DIAGNOSIS — I4891 Unspecified atrial fibrillation: Secondary | ICD-10-CM | POA: Diagnosis not present

## 2019-05-14 DIAGNOSIS — E1122 Type 2 diabetes mellitus with diabetic chronic kidney disease: Secondary | ICD-10-CM | POA: Diagnosis not present

## 2019-05-14 DIAGNOSIS — M069 Rheumatoid arthritis, unspecified: Secondary | ICD-10-CM | POA: Diagnosis not present

## 2019-05-14 DIAGNOSIS — I482 Chronic atrial fibrillation, unspecified: Secondary | ICD-10-CM | POA: Diagnosis not present

## 2019-05-14 DIAGNOSIS — I10 Essential (primary) hypertension: Secondary | ICD-10-CM | POA: Diagnosis not present

## 2019-05-14 DIAGNOSIS — R2681 Unsteadiness on feet: Secondary | ICD-10-CM | POA: Diagnosis not present

## 2019-05-14 DIAGNOSIS — E039 Hypothyroidism, unspecified: Secondary | ICD-10-CM | POA: Diagnosis not present

## 2019-05-14 DIAGNOSIS — I5032 Chronic diastolic (congestive) heart failure: Secondary | ICD-10-CM | POA: Diagnosis not present

## 2019-05-14 DIAGNOSIS — R2689 Other abnormalities of gait and mobility: Secondary | ICD-10-CM | POA: Diagnosis not present

## 2019-05-14 DIAGNOSIS — U071 COVID-19: Secondary | ICD-10-CM | POA: Diagnosis not present

## 2019-05-15 DIAGNOSIS — R2689 Other abnormalities of gait and mobility: Secondary | ICD-10-CM | POA: Diagnosis not present

## 2019-05-15 DIAGNOSIS — I10 Essential (primary) hypertension: Secondary | ICD-10-CM | POA: Diagnosis not present

## 2019-05-15 DIAGNOSIS — E039 Hypothyroidism, unspecified: Secondary | ICD-10-CM | POA: Diagnosis not present

## 2019-05-15 DIAGNOSIS — I4891 Unspecified atrial fibrillation: Secondary | ICD-10-CM | POA: Diagnosis not present

## 2019-05-15 DIAGNOSIS — R2681 Unsteadiness on feet: Secondary | ICD-10-CM | POA: Diagnosis not present

## 2019-05-15 DIAGNOSIS — M069 Rheumatoid arthritis, unspecified: Secondary | ICD-10-CM | POA: Diagnosis not present

## 2019-05-15 DIAGNOSIS — U071 COVID-19: Secondary | ICD-10-CM | POA: Diagnosis not present

## 2019-05-15 DIAGNOSIS — K219 Gastro-esophageal reflux disease without esophagitis: Secondary | ICD-10-CM | POA: Diagnosis not present

## 2019-05-15 DIAGNOSIS — E1122 Type 2 diabetes mellitus with diabetic chronic kidney disease: Secondary | ICD-10-CM | POA: Diagnosis not present

## 2019-05-16 DIAGNOSIS — I4891 Unspecified atrial fibrillation: Secondary | ICD-10-CM | POA: Diagnosis not present

## 2019-05-16 DIAGNOSIS — K219 Gastro-esophageal reflux disease without esophagitis: Secondary | ICD-10-CM | POA: Diagnosis not present

## 2019-05-16 DIAGNOSIS — U071 COVID-19: Secondary | ICD-10-CM | POA: Diagnosis not present

## 2019-05-16 DIAGNOSIS — I10 Essential (primary) hypertension: Secondary | ICD-10-CM | POA: Diagnosis not present

## 2019-05-16 DIAGNOSIS — E039 Hypothyroidism, unspecified: Secondary | ICD-10-CM | POA: Diagnosis not present

## 2019-05-16 DIAGNOSIS — R2689 Other abnormalities of gait and mobility: Secondary | ICD-10-CM | POA: Diagnosis not present

## 2019-05-16 DIAGNOSIS — E1122 Type 2 diabetes mellitus with diabetic chronic kidney disease: Secondary | ICD-10-CM | POA: Diagnosis not present

## 2019-05-16 DIAGNOSIS — M069 Rheumatoid arthritis, unspecified: Secondary | ICD-10-CM | POA: Diagnosis not present

## 2019-05-16 DIAGNOSIS — R2681 Unsteadiness on feet: Secondary | ICD-10-CM | POA: Diagnosis not present

## 2019-05-17 DIAGNOSIS — U071 COVID-19: Secondary | ICD-10-CM | POA: Diagnosis not present

## 2019-05-17 DIAGNOSIS — R2681 Unsteadiness on feet: Secondary | ICD-10-CM | POA: Diagnosis not present

## 2019-05-17 DIAGNOSIS — M069 Rheumatoid arthritis, unspecified: Secondary | ICD-10-CM | POA: Diagnosis not present

## 2019-05-17 DIAGNOSIS — E039 Hypothyroidism, unspecified: Secondary | ICD-10-CM | POA: Diagnosis not present

## 2019-05-17 DIAGNOSIS — E1122 Type 2 diabetes mellitus with diabetic chronic kidney disease: Secondary | ICD-10-CM | POA: Diagnosis not present

## 2019-05-17 DIAGNOSIS — R2689 Other abnormalities of gait and mobility: Secondary | ICD-10-CM | POA: Diagnosis not present

## 2019-05-17 DIAGNOSIS — I10 Essential (primary) hypertension: Secondary | ICD-10-CM | POA: Diagnosis not present

## 2019-05-17 DIAGNOSIS — K219 Gastro-esophageal reflux disease without esophagitis: Secondary | ICD-10-CM | POA: Diagnosis not present

## 2019-05-17 DIAGNOSIS — I4891 Unspecified atrial fibrillation: Secondary | ICD-10-CM | POA: Diagnosis not present

## 2019-05-19 ENCOUNTER — Emergency Department (HOSPITAL_COMMUNITY): Payer: Medicare PPO

## 2019-05-19 ENCOUNTER — Emergency Department (HOSPITAL_COMMUNITY)
Admission: EM | Admit: 2019-05-19 | Discharge: 2019-05-19 | Disposition: A | Discharge status: Home / Self Care | Payer: Medicare PPO | Attending: Emergency Medicine | Admitting: Emergency Medicine

## 2019-05-19 DIAGNOSIS — I5032 Chronic diastolic (congestive) heart failure: Secondary | ICD-10-CM | POA: Diagnosis not present

## 2019-05-19 DIAGNOSIS — R402411 Glasgow coma scale score 13-15, in the field [EMT or ambulance]: Secondary | ICD-10-CM | POA: Diagnosis not present

## 2019-05-19 DIAGNOSIS — R279 Unspecified lack of coordination: Secondary | ICD-10-CM | POA: Diagnosis not present

## 2019-05-19 DIAGNOSIS — I10 Essential (primary) hypertension: Secondary | ICD-10-CM | POA: Diagnosis not present

## 2019-05-19 DIAGNOSIS — I2510 Atherosclerotic heart disease of native coronary artery without angina pectoris: Secondary | ICD-10-CM | POA: Diagnosis not present

## 2019-05-19 DIAGNOSIS — Z96651 Presence of right artificial knee joint: Secondary | ICD-10-CM | POA: Diagnosis not present

## 2019-05-19 DIAGNOSIS — N183 Chronic kidney disease, stage 3 unspecified: Secondary | ICD-10-CM | POA: Insufficient documentation

## 2019-05-19 DIAGNOSIS — R0902 Hypoxemia: Secondary | ICD-10-CM | POA: Diagnosis not present

## 2019-05-19 DIAGNOSIS — I130 Hypertensive heart and chronic kidney disease with heart failure and stage 1 through stage 4 chronic kidney disease, or unspecified chronic kidney disease: Secondary | ICD-10-CM | POA: Diagnosis not present

## 2019-05-19 DIAGNOSIS — S8991XA Unspecified injury of right lower leg, initial encounter: Secondary | ICD-10-CM | POA: Diagnosis not present

## 2019-05-19 DIAGNOSIS — Y929 Unspecified place or not applicable: Secondary | ICD-10-CM | POA: Diagnosis not present

## 2019-05-19 DIAGNOSIS — R410 Disorientation, unspecified: Secondary | ICD-10-CM | POA: Diagnosis not present

## 2019-05-19 DIAGNOSIS — I509 Heart failure, unspecified: Secondary | ICD-10-CM | POA: Diagnosis not present

## 2019-05-19 DIAGNOSIS — S0990XA Unspecified injury of head, initial encounter: Secondary | ICD-10-CM | POA: Diagnosis not present

## 2019-05-19 DIAGNOSIS — F0390 Unspecified dementia without behavioral disturbance: Secondary | ICD-10-CM | POA: Diagnosis not present

## 2019-05-19 DIAGNOSIS — W108XXA Fall (on) (from) other stairs and steps, initial encounter: Secondary | ICD-10-CM | POA: Insufficient documentation

## 2019-05-19 DIAGNOSIS — E1122 Type 2 diabetes mellitus with diabetic chronic kidney disease: Secondary | ICD-10-CM | POA: Diagnosis not present

## 2019-05-19 DIAGNOSIS — I4891 Unspecified atrial fibrillation: Secondary | ICD-10-CM | POA: Diagnosis not present

## 2019-05-19 DIAGNOSIS — U071 COVID-19: Secondary | ICD-10-CM | POA: Diagnosis not present

## 2019-05-19 DIAGNOSIS — Z7901 Long term (current) use of anticoagulants: Secondary | ICD-10-CM | POA: Diagnosis not present

## 2019-05-19 DIAGNOSIS — N3000 Acute cystitis without hematuria: Secondary | ICD-10-CM

## 2019-05-19 DIAGNOSIS — S199XXA Unspecified injury of neck, initial encounter: Secondary | ICD-10-CM | POA: Diagnosis not present

## 2019-05-19 DIAGNOSIS — E1151 Type 2 diabetes mellitus with diabetic peripheral angiopathy without gangrene: Secondary | ICD-10-CM | POA: Insufficient documentation

## 2019-05-19 DIAGNOSIS — W19XXXA Unspecified fall, initial encounter: Secondary | ICD-10-CM

## 2019-05-19 DIAGNOSIS — Y999 Unspecified external cause status: Secondary | ICD-10-CM | POA: Diagnosis not present

## 2019-05-19 DIAGNOSIS — Y939 Activity, unspecified: Secondary | ICD-10-CM | POA: Diagnosis not present

## 2019-05-19 DIAGNOSIS — S3993XA Unspecified injury of pelvis, initial encounter: Secondary | ICD-10-CM | POA: Diagnosis not present

## 2019-05-19 DIAGNOSIS — F419 Anxiety disorder, unspecified: Secondary | ICD-10-CM | POA: Diagnosis not present

## 2019-05-19 DIAGNOSIS — Z79899 Other long term (current) drug therapy: Secondary | ICD-10-CM | POA: Insufficient documentation

## 2019-05-19 DIAGNOSIS — F329 Major depressive disorder, single episode, unspecified: Secondary | ICD-10-CM | POA: Diagnosis not present

## 2019-05-19 DIAGNOSIS — Z743 Need for continuous supervision: Secondary | ICD-10-CM | POA: Diagnosis not present

## 2019-05-19 DIAGNOSIS — S299XXA Unspecified injury of thorax, initial encounter: Secondary | ICD-10-CM | POA: Diagnosis not present

## 2019-05-19 DIAGNOSIS — I4821 Permanent atrial fibrillation: Secondary | ICD-10-CM | POA: Diagnosis not present

## 2019-05-19 LAB — CBC WITH DIFFERENTIAL/PLATELET
Abs Immature Granulocytes: 0.02 10*3/uL (ref 0.00–0.07)
Basophils Absolute: 0 10*3/uL (ref 0.0–0.1)
Basophils Relative: 0 %
Eosinophils Absolute: 0 10*3/uL (ref 0.0–0.5)
Eosinophils Relative: 1 %
HCT: 36.8 % (ref 36.0–46.0)
Hemoglobin: 12 g/dL (ref 12.0–15.0)
Immature Granulocytes: 1 %
Lymphocytes Relative: 23 %
Lymphs Abs: 1 10*3/uL (ref 0.7–4.0)
MCH: 31.7 pg (ref 26.0–34.0)
MCHC: 32.6 g/dL (ref 30.0–36.0)
MCV: 97.1 fL (ref 80.0–100.0)
Monocytes Absolute: 0.5 10*3/uL (ref 0.1–1.0)
Monocytes Relative: 11 %
Neutro Abs: 2.7 10*3/uL (ref 1.7–7.7)
Neutrophils Relative %: 64 %
Platelets: 373 10*3/uL (ref 150–400)
RBC: 3.79 MIL/uL — ABNORMAL LOW (ref 3.87–5.11)
RDW: 14.6 % (ref 11.5–15.5)
WBC: 4.2 10*3/uL (ref 4.0–10.5)
nRBC: 0 % (ref 0.0–0.2)

## 2019-05-19 LAB — COMPREHENSIVE METABOLIC PANEL
ALT: 27 U/L (ref 0–44)
AST: 38 U/L (ref 15–41)
Albumin: 2.7 g/dL — ABNORMAL LOW (ref 3.5–5.0)
Alkaline Phosphatase: 92 U/L (ref 38–126)
Anion gap: 10 (ref 5–15)
BUN: 26 mg/dL — ABNORMAL HIGH (ref 8–23)
CO2: 24 mmol/L (ref 22–32)
Calcium: 9.7 mg/dL (ref 8.9–10.3)
Chloride: 102 mmol/L (ref 98–111)
Creatinine, Ser: 1.02 mg/dL — ABNORMAL HIGH (ref 0.44–1.00)
GFR calc Af Amer: 57 mL/min — ABNORMAL LOW (ref >60)
GFR calc non Af Amer: 49 mL/min — ABNORMAL LOW (ref >60)
Glucose, Bld: 176 mg/dL — ABNORMAL HIGH (ref 70–99)
Potassium: 4 mmol/L (ref 3.5–5.1)
Sodium: 136 mmol/L (ref 135–145)
Total Bilirubin: 0.5 mg/dL (ref 0.3–1.2)
Total Protein: 7.1 g/dL (ref 6.5–8.1)

## 2019-05-19 LAB — URINALYSIS, ROUTINE W REFLEX MICROSCOPIC
Bilirubin Urine: NEGATIVE
Glucose, UA: NEGATIVE mg/dL
Ketones, ur: NEGATIVE mg/dL
Nitrite: NEGATIVE
Protein, ur: 100 mg/dL — AB
Specific Gravity, Urine: 1.013 (ref 1.005–1.030)
WBC, UA: >50 WBC/hpf — ABNORMAL HIGH (ref 0–5)
pH: 5 (ref 5.0–8.0)

## 2019-05-19 LAB — PROTIME-INR
INR: 1.4 — ABNORMAL HIGH (ref 0.8–1.2)
Prothrombin Time: 17.2 seconds — ABNORMAL HIGH (ref 11.4–15.2)

## 2019-05-19 IMAGING — CT CT HEAD W/O CM
5 of 8 series · 17 of 47 positions shown, 19 images · non-contrast
Comparison: [DATE]

CLINICAL DATA: Unwitnessed fall

EXAM:
CT HEAD WITHOUT CONTRAST
TECHNIQUE: Contiguous axial images were obtained from the base of the skull
through the vertex without intravenous contrast.

[Series 3: head wo · axial · 0.46mm/px · z∈[-24,+36]mm · 2 of 36 slices shown]
[im 12/36  brain]
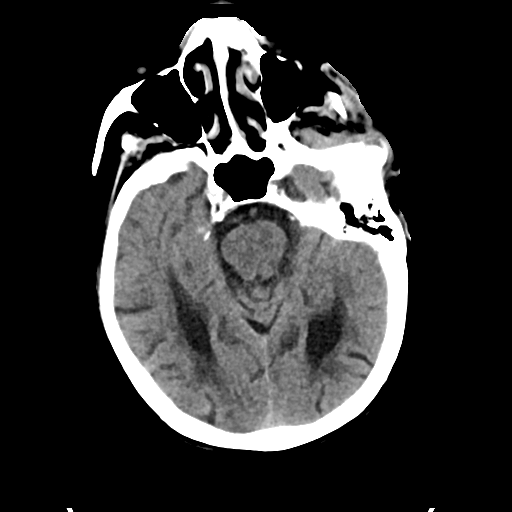
[im 24/36  brain]
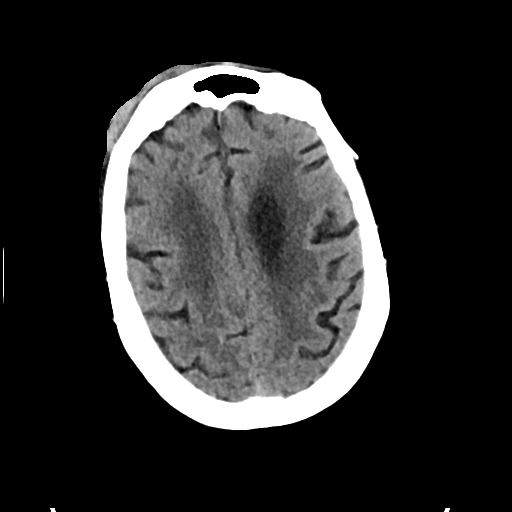

[Series 4: head bone · axial · 0.46mm/px · z∈[-60,+28]mm · 5 of 88 slices shown]
[im 11/88  bone]
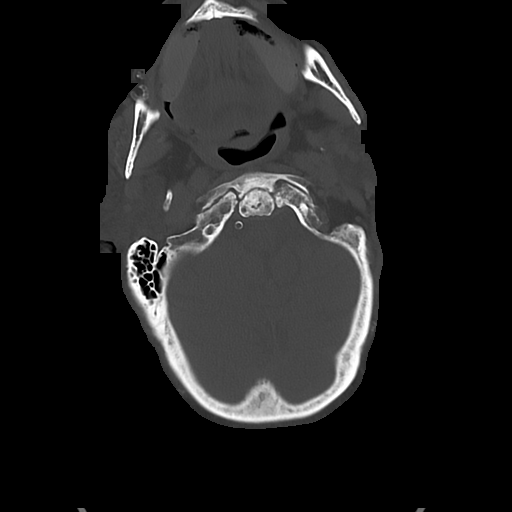
[im 22/88  bone]
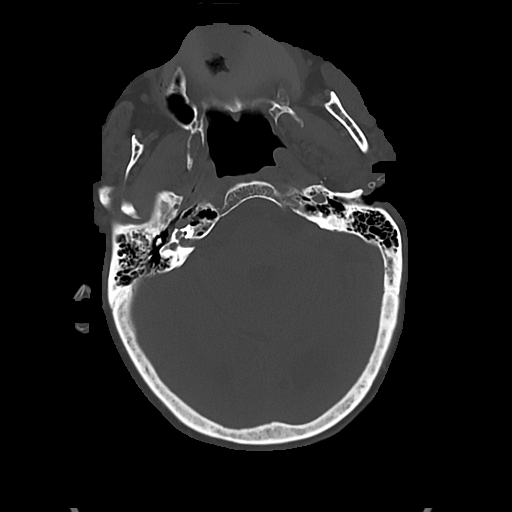
[im 33/88  bone]
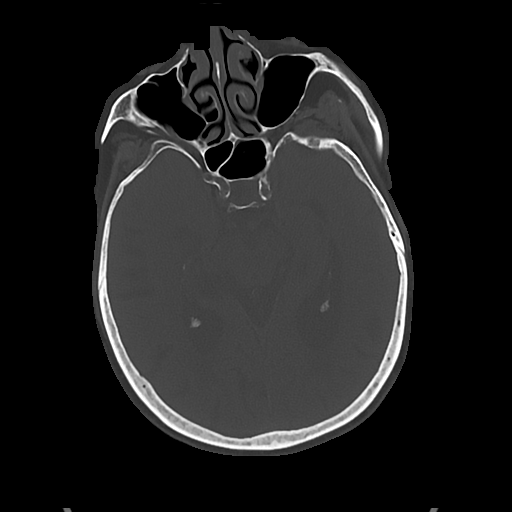
[im 44/88  bone]
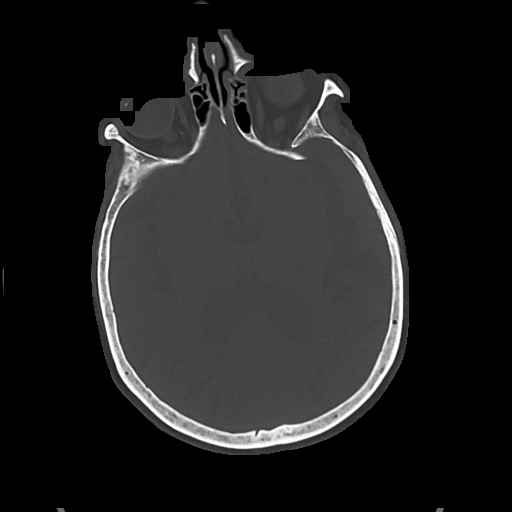
[im 55/88  bone]
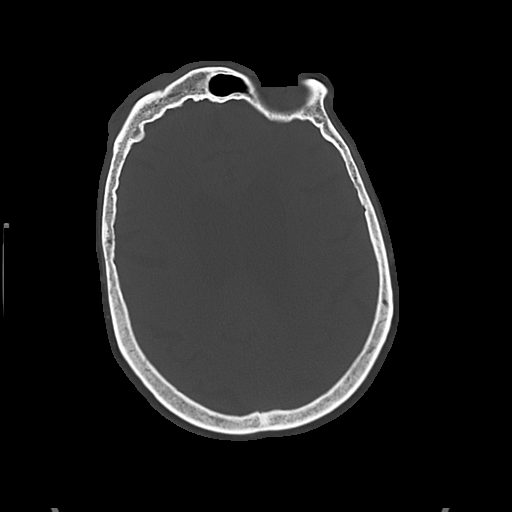

[Series 5: cor soft · coronal · 0.35mm/px · 3 of 72 slices shown]
[im 17/72  brain]
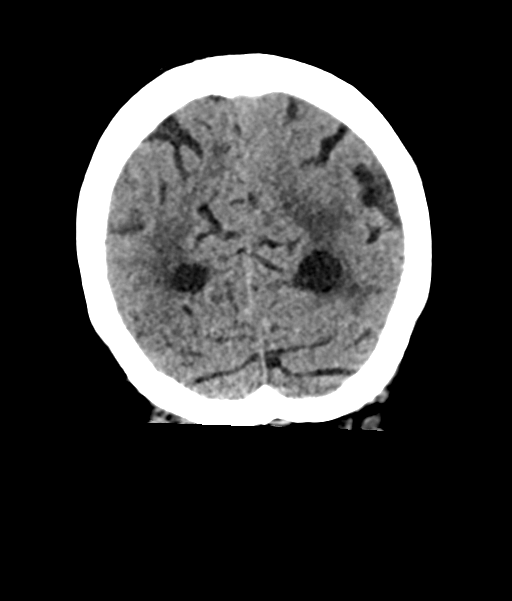
[im 22/72  brain]
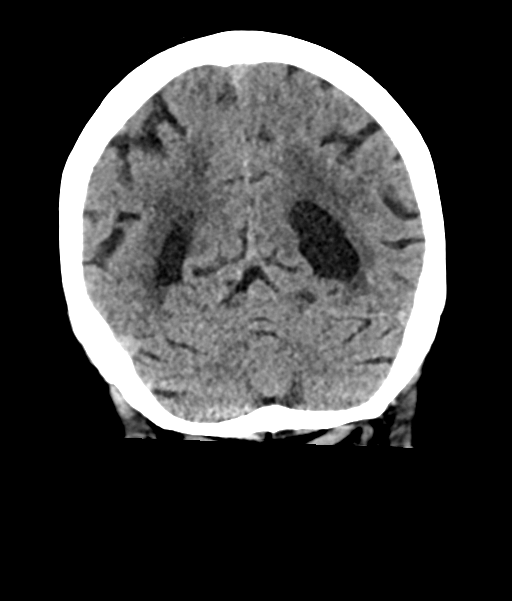
[im 28/72  brain]
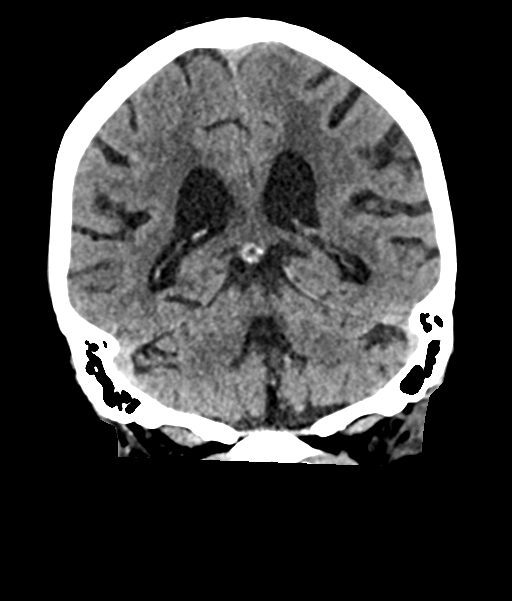

[Series 6: sag soft · sagittal · 0.41mm/px · 1 of 59 slices shown]
[im 30/59  brain]
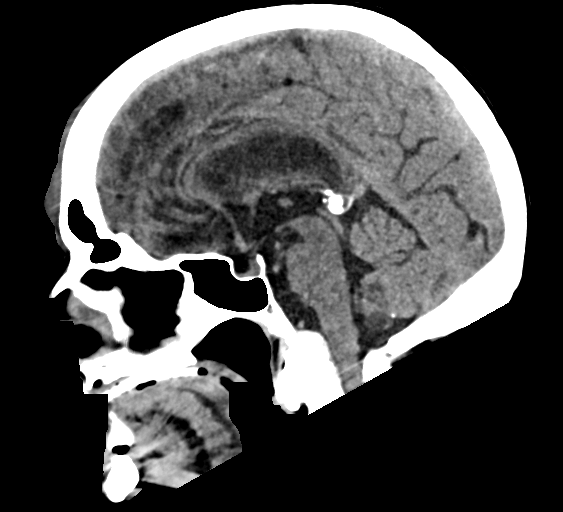

[Series 14: orthogonal axials · axial · 0.21mm/px · z∈[-181,-66]mm · 6 of 78 slices shown, 8 images]
[im 12/78  brain]
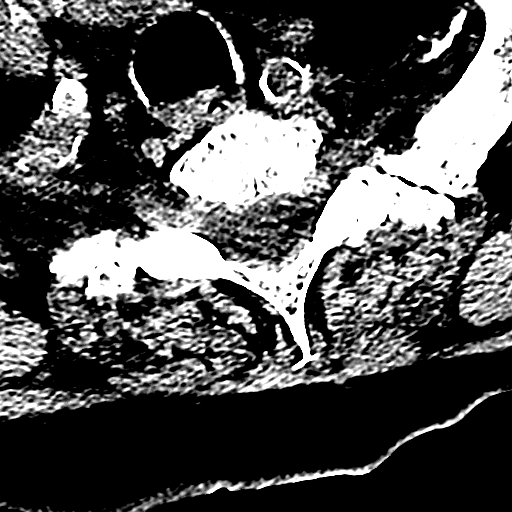
[im 12/78  bone]
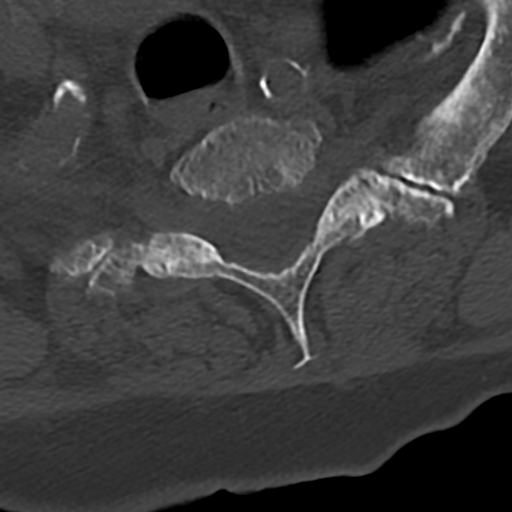
[im 23/78  brain]
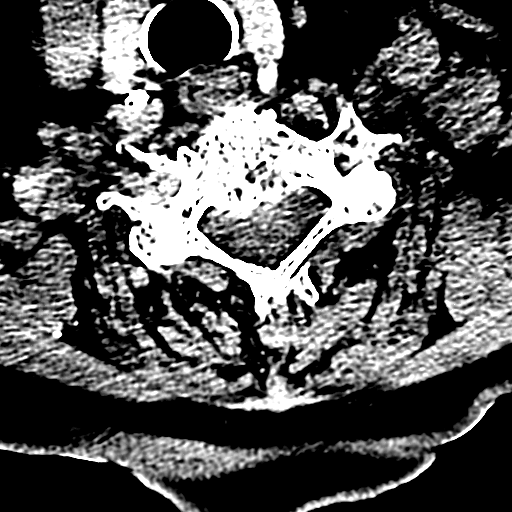
[im 34/78  brain]
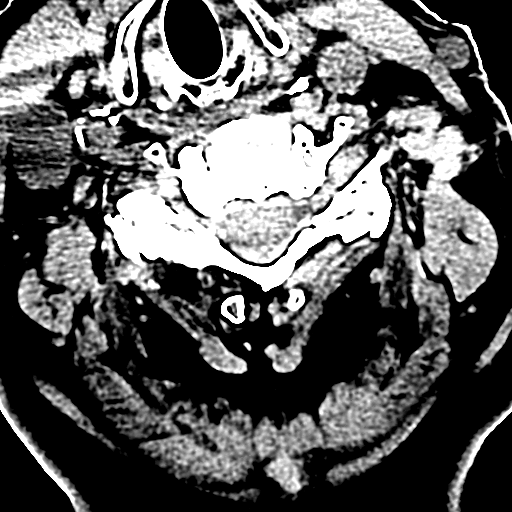
[im 45/78  brain]
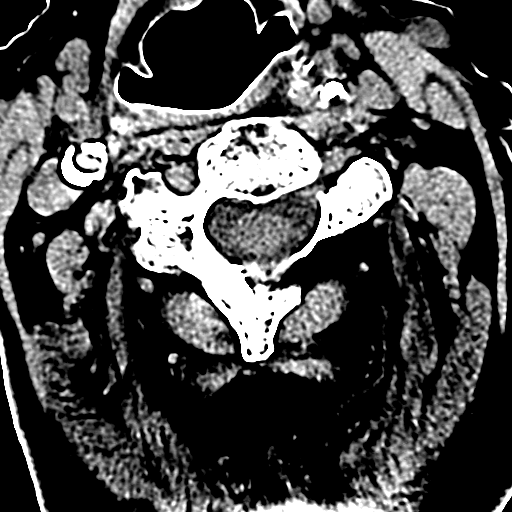
[im 56/78  brain]
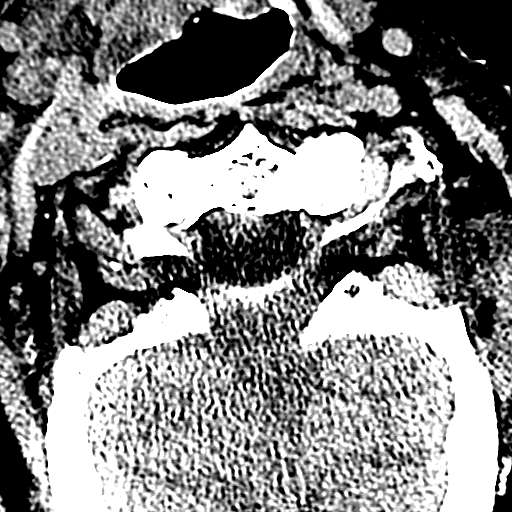
[im 56/78  bone]
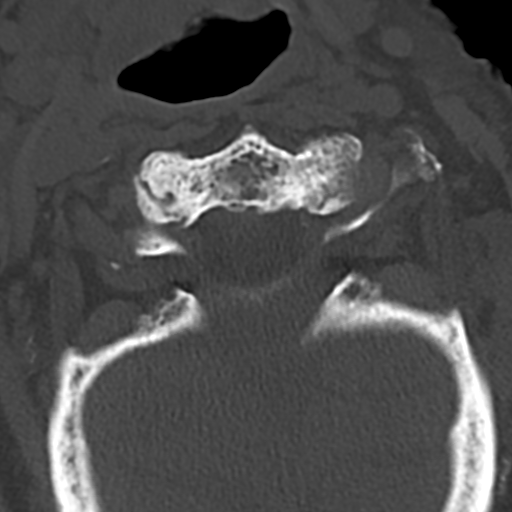
[im 67/78  brain]
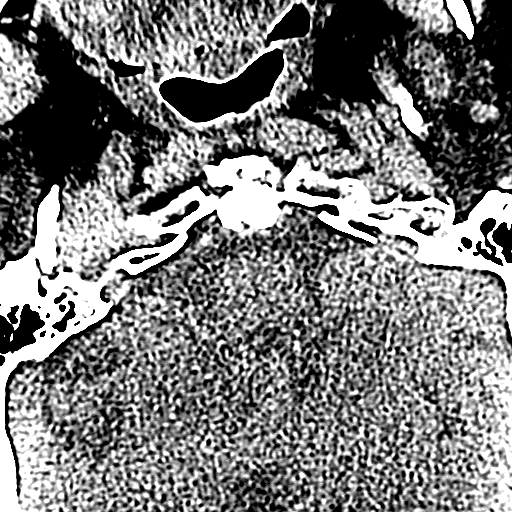

[17 of 47 positions shown; findings below may reference images not displayed]

FINDINGS: Brain: There is atrophy and chronic small vessel disease changes. No
acute intracranial abnormality. Specifically, no hemorrhage,
hydrocephalus, mass lesion, acute infarction, or significant
intracranial injury.

Vascular: No hyperdense vessel or unexpected calcification.

Skull: No acute calvarial abnormality.

Sinuses/Orbits: Visualized paranasal sinuses and mastoids clear.
Orbital soft tissues unremarkable.

Other: Large scalp hematoma in the right forehead region.
IMPRESSION: Atrophy, chronic microvascular disease.

No acute intracranial abnormality.

## 2019-05-19 IMAGING — CR DG KNEE 1-2V*R*
2 series · 2 of 2 positions shown · non-contrast
Comparison: [DATE]

CLINICAL DATA: Status post fall.

EXAM:
RIGHT KNEE - 1-2 VIEW

[knee ap]
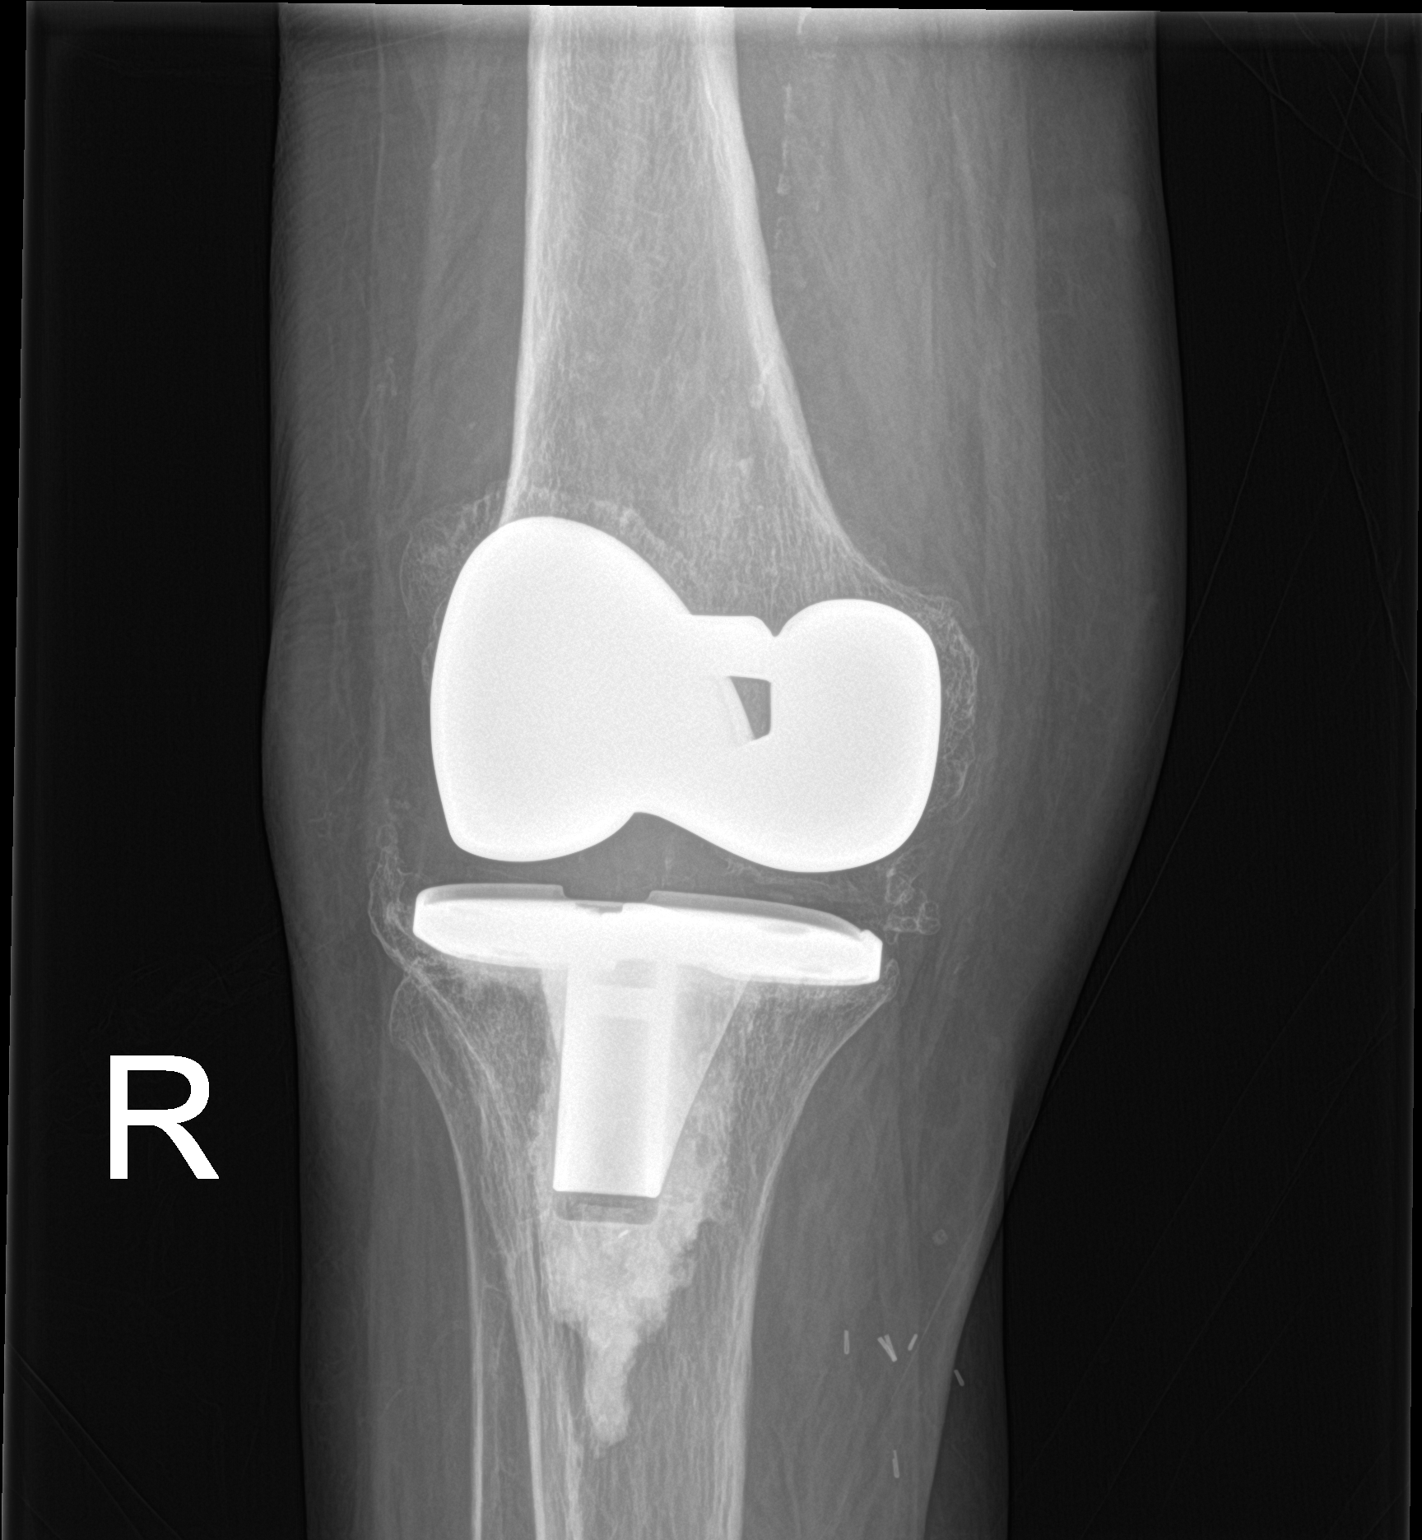

[knee lat]
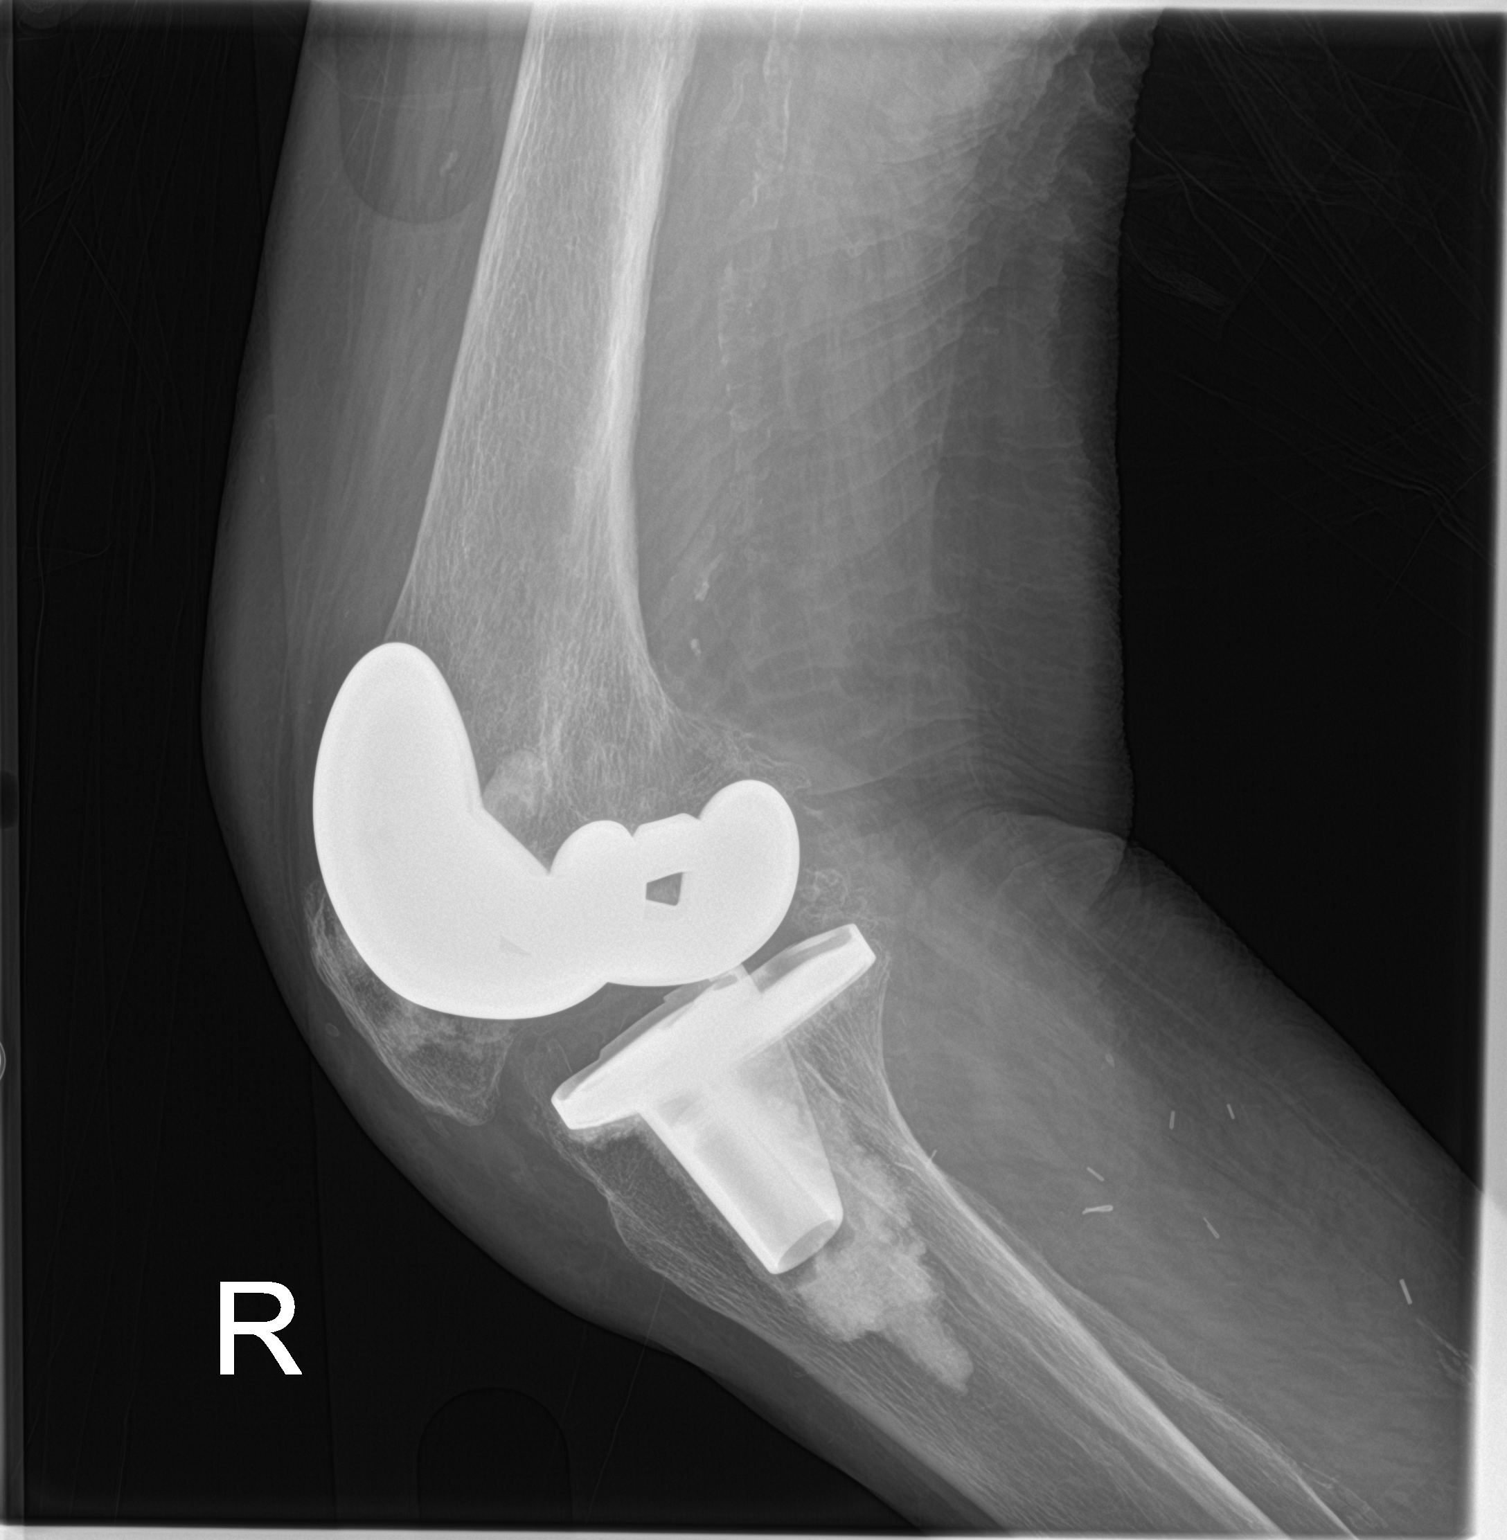

[2 of 2 positions shown; findings below may reference images not displayed]

FINDINGS: No evidence of acute fracture or dislocation. A right knee
replacement is seen without evidence of surrounding lucency to
suggest the presence of hardware loosening or infection. A small
joint effusion is seen. Mild medial soft tissue swelling is also
noted. There is mild vascular calcification.
IMPRESSION: Intact right knee replacement without evidence of an acute fracture
or dislocation.

## 2019-05-19 IMAGING — CR DG PELVIS 1-2V
1 series · 1 of 1 positions shown · non-contrast
Comparison: None.

CLINICAL DATA: Status post fall.

EXAM:
PELVIS - 1-2 VIEW

[pelvis ap]
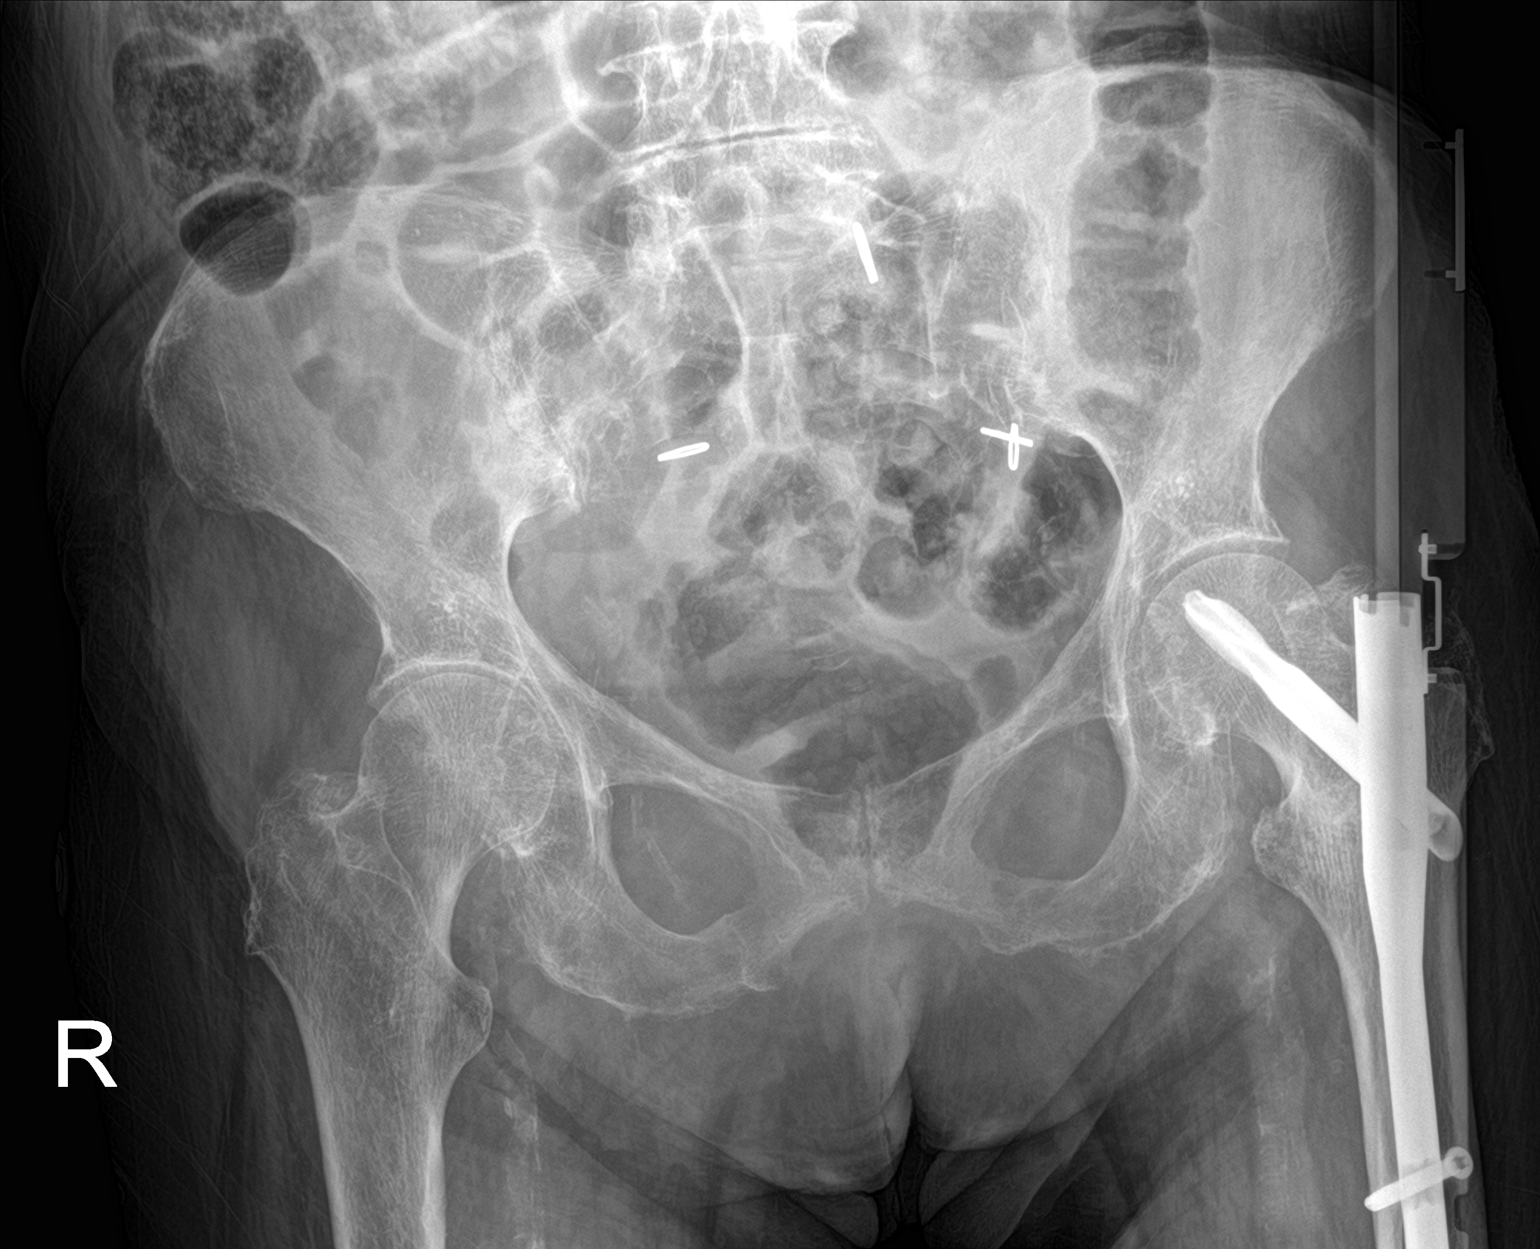

[1 of 1 positions shown; findings below may reference images not displayed]

FINDINGS: There is no evidence of an acute pelvic fracture or diastasis.
Chronic appearing deformities are seen involving the bilateral
inferior pubic rami. A radiopaque intramedullary rod and compression
screw device are seen within the proximal left femur. Moderate
severity degenerative changes seen involving both hips and the
visualized portion of the lower lumbar spine. Radiopaque surgical
clips are seen overlying the pelvis.
IMPRESSION: No evidence of an acute pelvic fracture or diastasis.

## 2019-05-19 IMAGING — CR DG CHEST 1V
1 series · 1 of 1 positions shown · non-contrast
Comparison: [DATE]

CLINICAL DATA: Status post fall.

EXAM:
CHEST  1 VIEW

[chest ap]
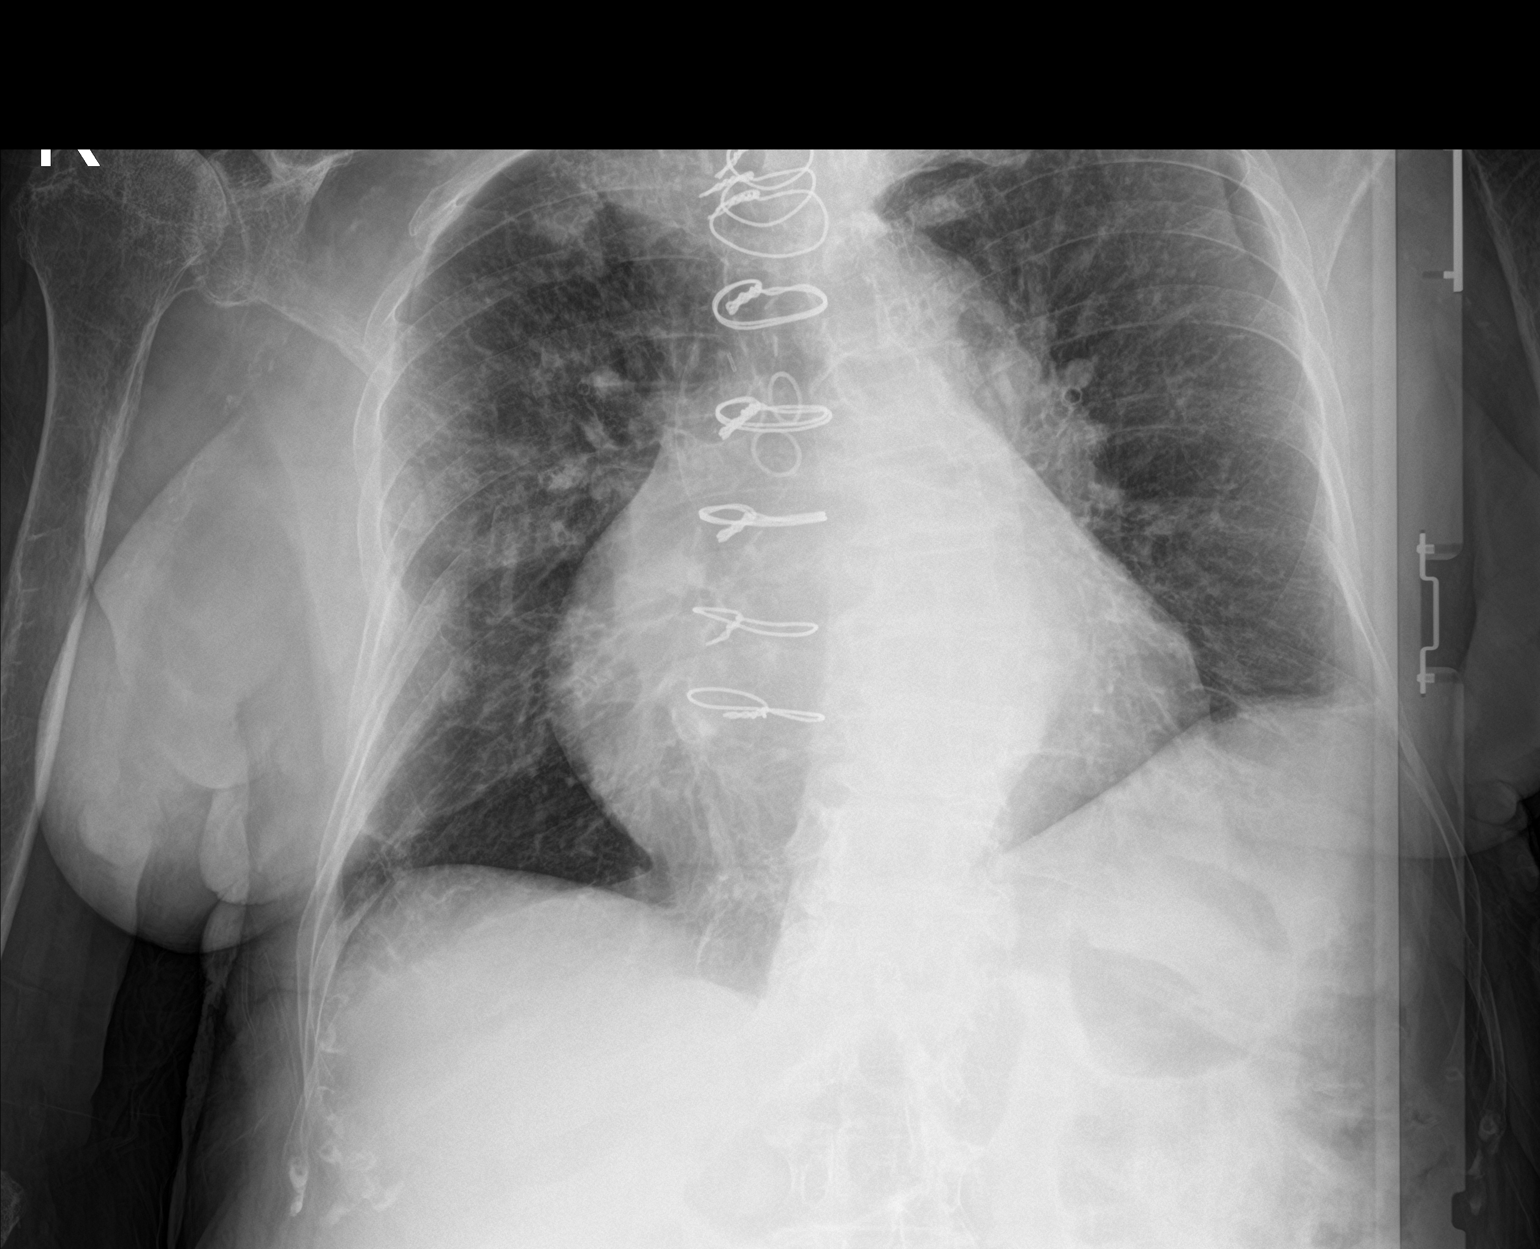

[1 of 1 positions shown; findings below may reference images not displayed]

FINDINGS: Stable moderate severity chronic appearing increased interstitial
lung markings are seen. There is no evidence of acute infiltrate,
pleural effusion or pneumothorax. There is moderate to marked
severity enlargement of the cardiac silhouette. Moderate to marked
severity levoscoliosis of the thoracic spine is seen with multilevel
degenerative changes.
IMPRESSION: Stable exam without evidence of acute or active cardiopulmonary
disease.

## 2019-05-19 IMAGING — CT CT CERVICAL SPINE W/O CM
4 series · 15 of 33 positions shown, 18 images · non-contrast
Comparison: None.

CLINICAL DATA: Unwitnessed fall

EXAM:
CT CERVICAL SPINE WITHOUT CONTRAST
TECHNIQUE: Multidetector CT imaging of the cervical spine was performed without
intravenous contrast. Multiplanar CT image reconstructions were also
generated.

[Series 8: c spine soft · axial · 0.36mm/px · z∈[-163,-133]mm · 2 of 88 slices shown]
[im 15/88  soft-tissue]
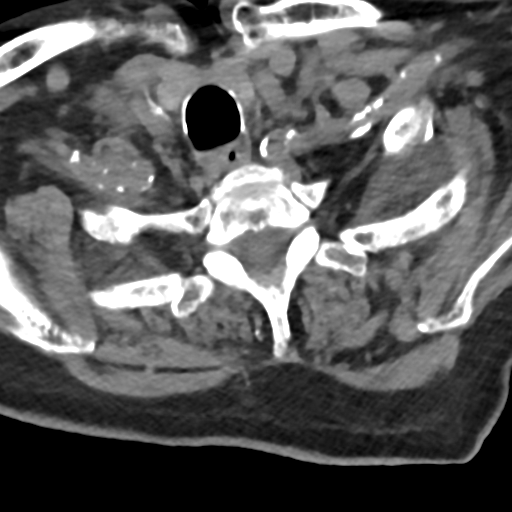
[im 30/88  soft-tissue]
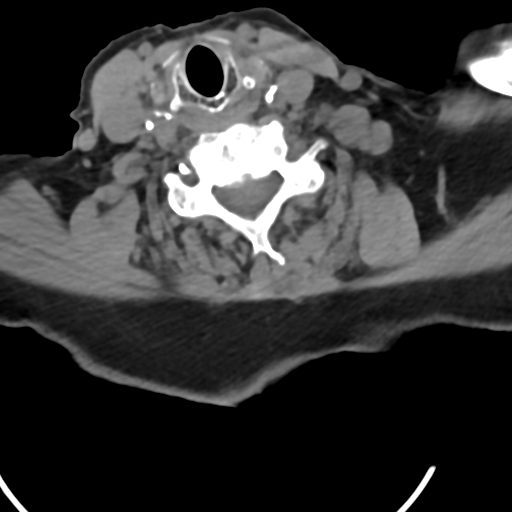

[Series 11: sag bone · sagittal · 0.31mm/px · 5 of 88 slices shown, 6 images]
[im 30/88  bone]
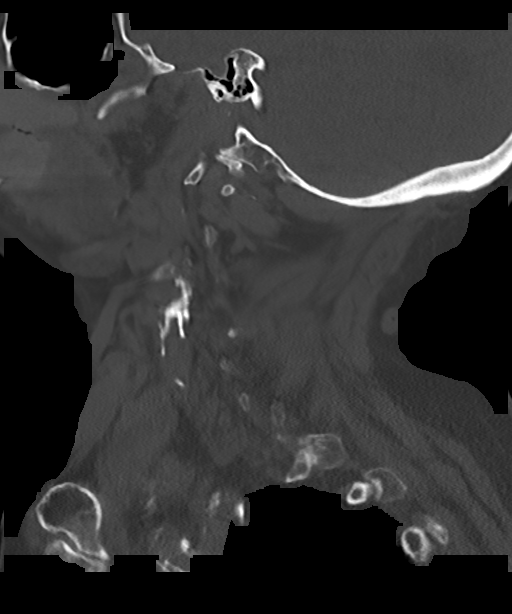
[im 37/88  bone]
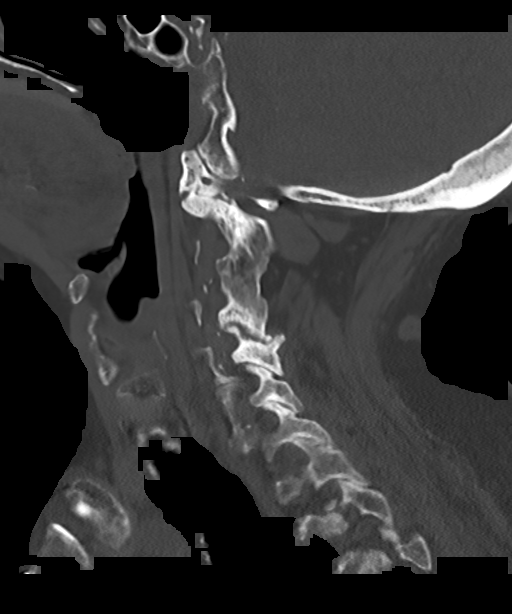
[im 44/88  soft-tissue]
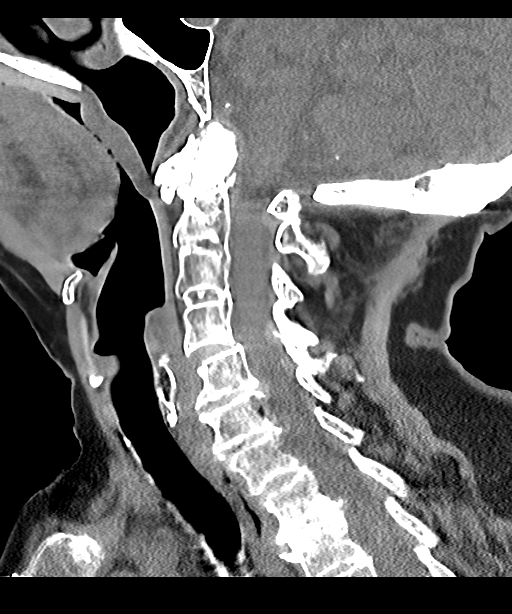
[im 44/88  bone]
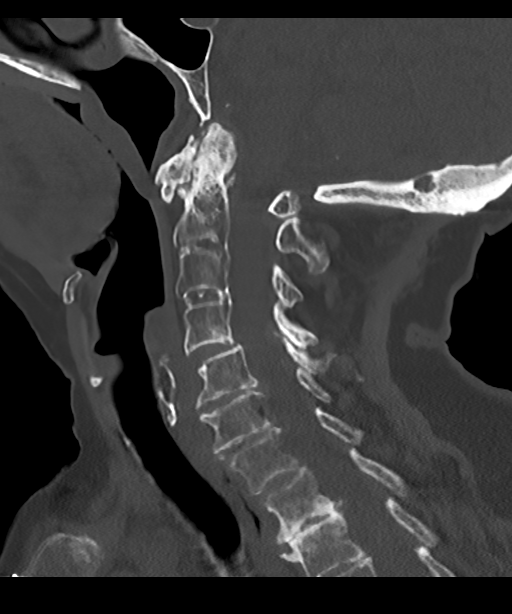
[im 51/88  bone]
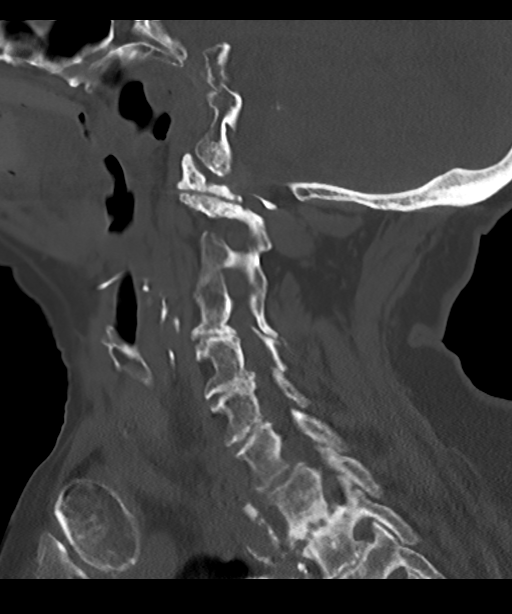
[im 59/88  bone]
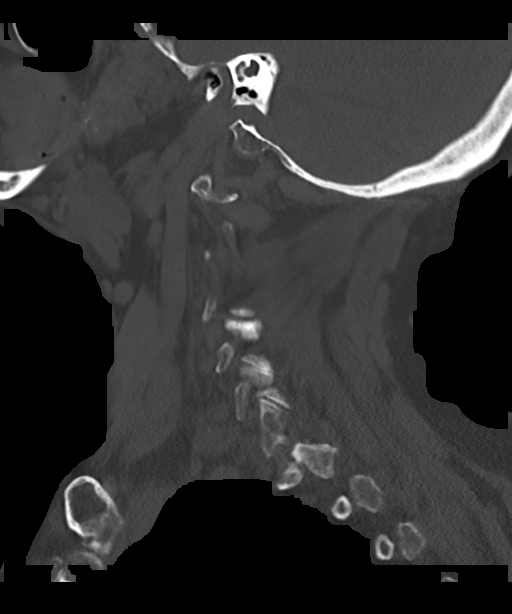

[Series 12: cor bone · coronal · 0.39mm/px · 3 of 67 slices shown]
[im 14/67  bone]
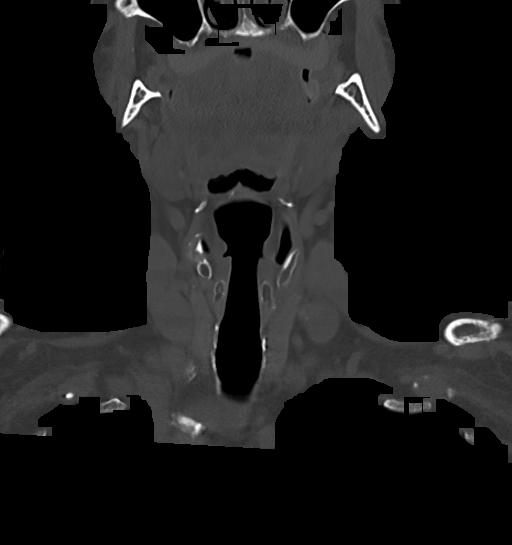
[im 27/67  bone]
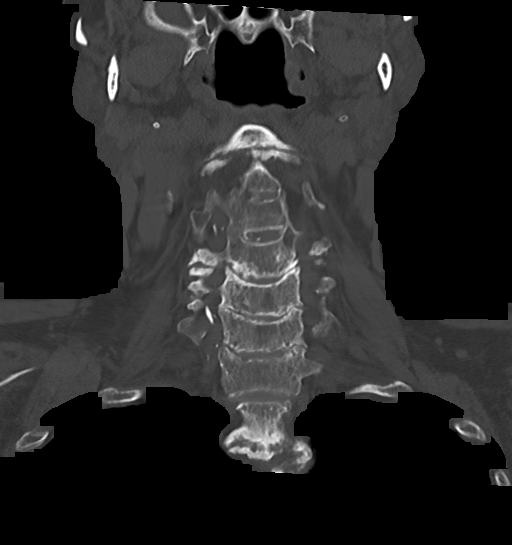
[im 40/67  bone]
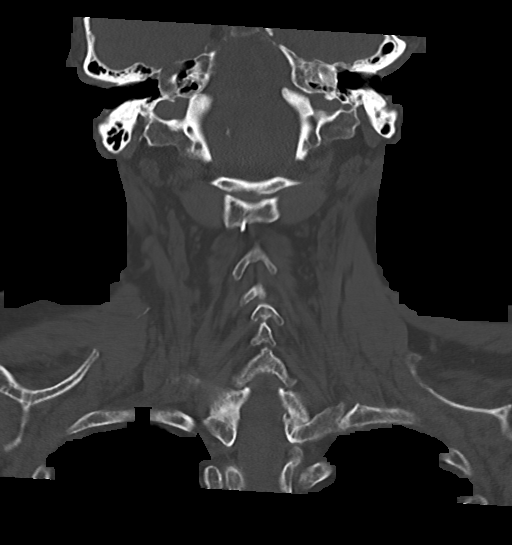

[Series 14: orthogonal axials · axial · 0.21mm/px · z∈[-179,-70]mm · 5 of 78 slices shown, 7 images]
[im 13/78  soft-tissue]
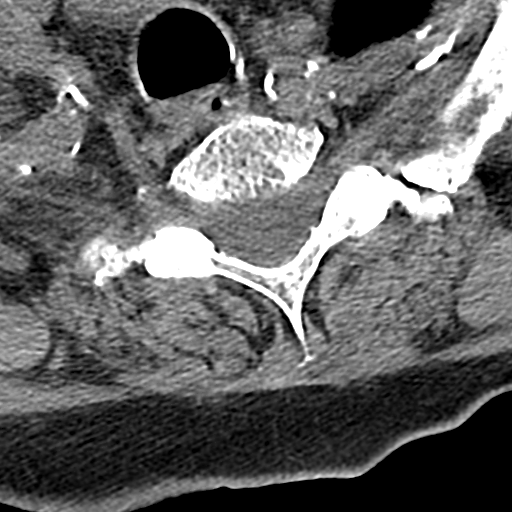
[im 13/78  bone]
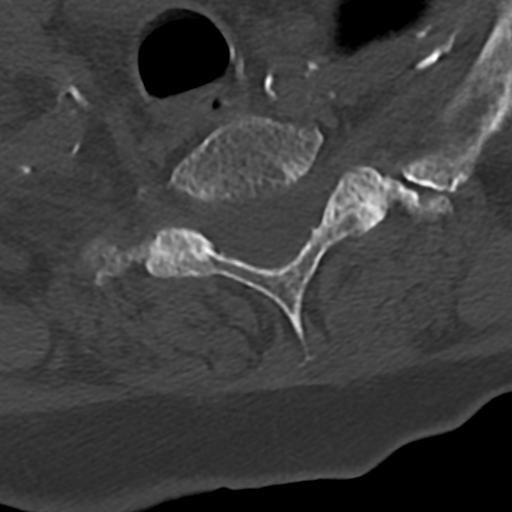
[im 26/78  bone]
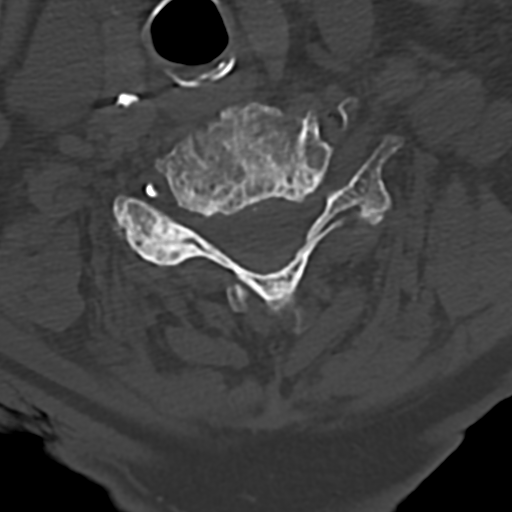
[im 39/78  bone]
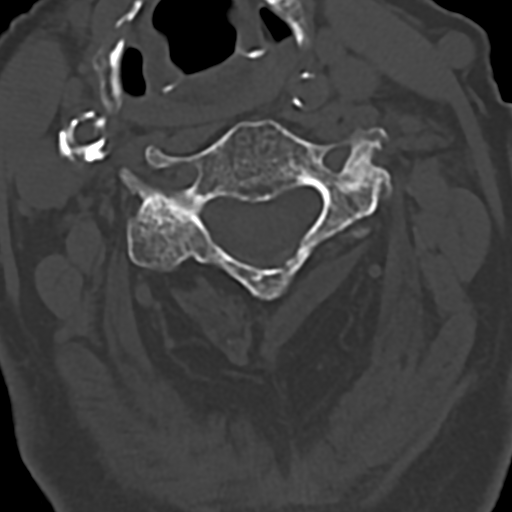
[im 52/78  bone]
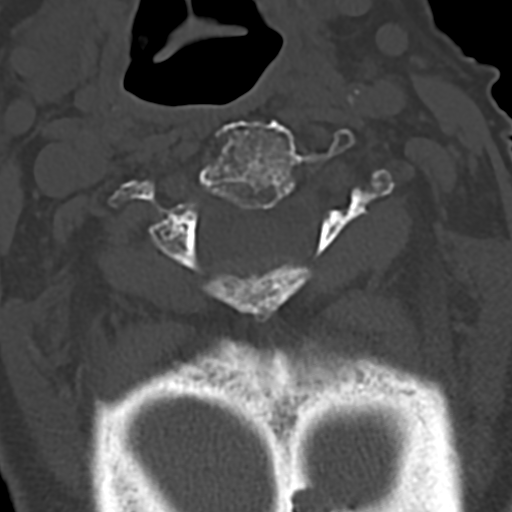
[im 65/78  soft-tissue]
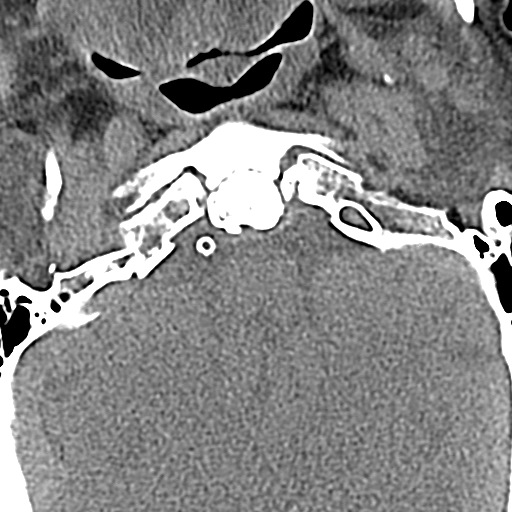
[im 65/78  bone]
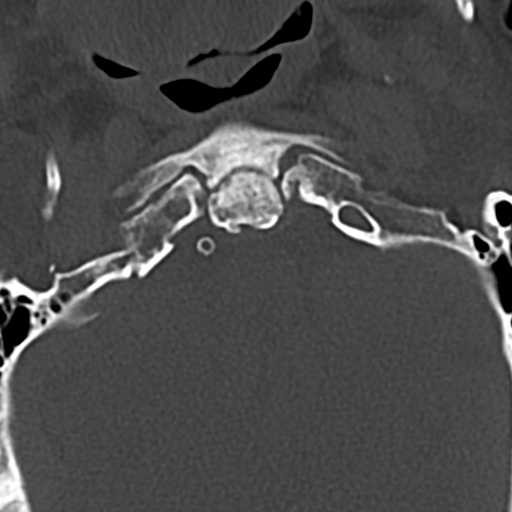

[15 of 33 positions shown; findings below may reference images not displayed]

FINDINGS: Alignment: Slight anterolisthesis of C4 on C5 and C7 on T1 related
to facet disease.

Skull base and vertebrae: No acute fracture. No primary bone lesion
or focal pathologic process.

Soft tissues and spinal canal: No prevertebral fluid or swelling. No
visible canal hematoma.

Disc levels:  Advanced diffuse degenerative facet and disc disease.

Upper chest: No acute findings

Other: Carotid artery calcifications.
IMPRESSION: Advanced degenerative disc and facet disease.

No acute bony abnormality.

## 2019-05-19 MED ORDER — FOSFOMYCIN TROMETHAMINE 3 G PO PACK
3.0000 g | PACK | Freq: Once | ORAL | Status: AC
  Administered 2019-05-19: 20:00:00 3 g via ORAL
  Filled 2019-05-19: qty 3

## 2019-05-19 MED ORDER — FOSFOMYCIN TROMETHAMINE 3 G PO PACK: 3.0000 g | PACK | Freq: Once | ORAL | 0 refills | Status: AC

## 2019-05-19 MED ORDER — OXYCODONE-ACETAMINOPHEN 5-325 MG PO TABS
1.0000 | ORAL_TABLET | Freq: Once | ORAL | Status: AC
  Administered 2019-05-19: 17:00:00 1 via ORAL
  Filled 2019-05-19: qty 1

## 2019-05-19 NOTE — ED Triage Notes (Signed)
Pt arrives by EMS with c/o of a unwitnessed fall. Pt was trying to get up to use restroom. Reached for walker and slipped. Denies LOC, and dizziness. Hematoma present on right forehead. Pt on eliquis.   Endorses right knee bruising  and pain. Pupils uneven at baseline from catartac surgery.   Dx with Covid X3 weeks ago  175/81 HR 110 95% RA 97.6   CBG: 222

## 2019-05-19 NOTE — ED Notes (Signed)
Pt was discharged from the ED. Pt read and understood discharge paperwork. Pt had vital signs completed. Pt conscious, breathing, and A&Ox4. No distress noted. Pt speaking in complete sentences. [facility] assumed care. All paperwork given to EMS and pt.

## 2019-05-19 NOTE — Discharge Instructions (Addendum)
Patient was seen in the emergency department for a fall.  Her CT scans were negative for any injuries, x-rays were negative as well.  Patient does have a urinary tract infection.  She was given an antibiotic in the ED.  Ice and compressive bandage to the hematoma on her head, and continue to monitor her.  Return to the emergency department for any worsening mental status, headaches, or weakness or sensation changes in her extremities.  Patient can have Tylenol and Motrin for pain.  Patient was also sent home with 1 dose of fosfomycin, 3 g pack.  Please take as prescribed, 1 dose.  Take this dose on February 8.

## 2019-05-19 NOTE — ED Notes (Signed)
[facility] called for transport. Report given to [facility].  Instructions, Rx info, follow up care given to facility & daughter who is at bedside.

## 2019-05-19 NOTE — ED Provider Notes (Signed)
[facility] EMERGENCY DEPARTMENT Provider Note   CSN: [id number] Arrival date & time: 05/19/19  1625     History No chief complaint on file.   [name] is a 86 y.o. female.  HPI 86 year old female with history of CAD, CHF, diabetes, A. fib, currently on Eliquis, presenting to the ED for acute mechanical fall down a few stairs earlier today.  No loss of consciousness, patient did hit her head, sustained a large right-sided frontal hematoma.  Patient denies any chest pain or shortness of breath, no abdominal pain, on arrival patient was alert and oriented.  Per family however, they state that she has had increasing confusion over the course of yesterday and today, she does have a strong history of UTIs in family was concerned she was starting to have another UTI that is why she was confused and fell.  No chest pain, shortness of breath or palpitations.  She also reports some right knee pain, but otherwise denies any other injuries to her shoulders, neck, no visual symptoms, denies any syncope or loss of consciousness, patient states that she tripped and fell.    Past Medical History:  Diagnosis Date  . Acute pancreatitis   . Anemia   . Arthritis   . Atrial fibrillation (HCC)   . CAD (coronary artery disease)   . Carotid artery occlusion   . CHF (congestive heart failure) (HCC)   . Diabetes mellitus age 81  . DJD (degenerative joint disease)   . DVT (deep venous thrombosis) (HCC)   . GERD (gastroesophageal reflux disease)   . GI bleed   . Hyperlipidemia   . Hypertension   . Joint pain   . Mesenteric ischemia   . Peripheral vascular disease (HCC)   . Thyroid disease     Patient Active Problem List   Diagnosis Date Noted  . AKI (acute kidney injury) (HCC) 01/17/2018  . MDD (major depressive disorder) 01/15/2018  . Macrocytic anemia 09/22/2017  . Proteinuria 04/06/2017  . Insomnia 12/01/2016  . CAD (coronary artery disease) 11/17/2016  .  Hyperlipidemia 11/17/2016  . Hypertension 11/17/2016  . Aortic atherosclerosis (HCC) 11/03/2016  . CKD (chronic kidney disease) stage 3, GFR 30-59 ml/min (HCC) 04/14/2015  . Colonic constipation 04/14/2015  . Frequent UTI 04/01/2015  . Diabetes mellitus with renal complications (HCC) 03/03/2015  . Atrophic vaginitis 02/24/2015  . Rheumatoid arthritis (HCC) 02/18/2015  . DNR (do not resuscitate) 02/13/2015  . Occlusion and stenosis of carotid artery without mention of cerebral infarction 05/02/2013  . 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 FEMORAL BRUIT 11/24/2009  . Hypothyroidism 06/26/2009  . CEREBROVASCULAR DISEASE 06/25/2009  . ATRIAL FIBRILLATION 05/19/2008  . Chronic diastolic heart failure (HCC) 05/19/2008  . RENAL ARTERY STENOSIS 05/19/2008  . PERIPHERAL VASCULAR DISEASE 05/19/2008    Past Surgical History:  Procedure Laterality Date  . APPENDECTOMY    . CELIAC ARTERY STENT    . CORONARY ARTERY BYPASS GRAFT    . EMBOLECTOMY  2009   right leg  . FASCIOTOMY     right leg  . HIP FRACTURE SURGERY Left   . JOINT REPLACEMENT    . TOTAL KNEE ARTHROPLASTY     bilateral  . TUBAL LIGATION    . VISCERAL ANGIOGRAM N/A 05/31/2013   Procedure: MESSENTERIC [name];  Surgeon: [name], MD;  Location: [facility] CATH LAB;  Service: Cardiovascular;  Laterality: N/A;     OB History   No obstetric history on file.  Family History  Problem Relation Age of Onset  . Cancer Mother        ovarian  . Other Father        suicide  . Coronary artery disease Sister   . Other Sister        alzheimers  . Heart disease Sister        Before age 51  . Diabetes Sister   . Cancer Daughter        spinal tumor  . Diabetes Daughter   . Hypertension Daughter   . Diabetes Son   . Hyperlipidemia Son   . Hypertension Son     Social History   Tobacco Use  . Smoking status: Never Smoker  . Smokeless tobacco: Never Used  Substance Use Topics  . Alcohol use: No  .  Drug use: No    Home Medications Prior to Admission medications   Medication Sig Start Date End Date Taking? Authorizing Provider  AMBULATORY NON FORMULARY MEDICATION Please check INR weekly for 2 months. Send reports to Dr Denyse Amass fax [phone] 12/19/16  Yes Rodolph Bong, MD  apixaban (ELIQUIS) 5 MG TABS tablet Take 1 tablet (5 mg total) by mouth 2 (two) times daily. 11/21/17  Yes Lewayne Bunting, MD  Calcium Carb-Cholecalciferol 600-800 MG-UNIT TABS TAKE ONE TABLET BY MOUTH 2 TIMES A DAY Patient taking differently: Take 1 tablet by mouth 2 (two) times daily.  09/11/15  Yes Rodolph Bong, MD  clonazePAM (KLONOPIN) 0.5 MG tablet Take 1 tablet (0.5 mg total) by mouth at bedtime. 01/19/18  Yes Jerald Kief, MD  Cranberry 500 MG CAPS Take 500 mg by mouth daily.    Yes [provider]  D-MANNOSE PO Take 1 tablet by mouth 2 (two) times daily.   Yes [provider]  diltiazem (CARDIZEM CD) 180 MG 24 hr capsule Take 1 capsule (180 mg total) by mouth at bedtime. 05/24/17  Yes Rodolph Bong, MD  Ergocalciferol (VITAMIN D2) 2000 units TABS Take 2,000 Units by mouth daily.    Yes [provider]  ferrous sulfate 325 (65 FE) MG tablet Take 325 mg by mouth 2 (two) times daily.    Yes [provider]  FLUoxetine (PROZAC) 20 MG capsule Take 20 mg by mouth daily.   Yes [provider]  folic acid (FOLVITE) 1 MG tablet Take 1 tablet (1 mg total) by mouth daily. 05/24/17  Yes Rodolph Bong, MD  glucose blood test strip Once daily E11.29 03/03/15  Yes Rodolph Bong, MD  Infant Care Products [facility] EX) Apply 1 application topically 2 (two) times daily. Apply to buttocks   Yes [provider]  levothyroxine (SYNTHROID) 75 MCG tablet Take 75 mcg by mouth daily before breakfast.   Yes [provider]  loperamide (IMODIUM A-D) 2 MG tablet Take 4 mg by mouth See admin instructions. Take 4mg  after first loose stool and 2mg  after subsequent stool; do  not exceed 16mg  in 24hr   Yes [provider]  memantine (NAMENDA) 10 MG tablet Take 10 mg by mouth at bedtime.   Yes [provider]  methotrexate (RHEUMATREX) 10 MG tablet Take 20 mg by mouth once a week. On Friday  Caution: Chemotherapy. Protect from light.   Yes [provider]  metoprolol succinate (TOPROL-XL) 25 MG 24 hr tablet Take 25 mg by mouth daily.   Yes [provider]  polyethylene glycol (MIRALAX / GLYCOLAX) 17 g packet Take 17 g by  mouth daily.   Yes [provider]  pravastatin (PRAVACHOL) 40 MG tablet TAKE 1 TABLET (40 MG TOTAL) BY MOUTH DAILY. Patient taking differently: Take 40 mg by mouth at bedtime.  01/03/18  Yes Rodolph Bong, MD  PREMARIN vaginal cream PLACE 1 APPLICATORFUL VAGINALLY ONCE A DAY Patient taking differently: Place 1 Applicatorful vaginally daily.  12/08/17  Yes Rodolph Bong, MD  STOOL SOFTENER 100 MG capsule TAKE 1 CAPSULE BY MOUTH 2 TIMES A DAY (AM & PM) Patient taking differently: Take 100 mg by mouth 2 (two) times daily.  09/11/15  Yes Rodolph Bong, MD  torsemide (DEMADEX) 100 MG tablet Take 0.5 tablets (50 mg total) by mouth 2 (two) times daily. 05/24/17  Yes Rodolph Bong, MD  traMADol (ULTRAM) 50 MG tablet Take 1 tablet (50 mg total) by mouth 2 (two) times daily. 01/19/18  Yes Jerald Kief, MD  vitamin B-12 (CYANOCOBALAMIN) 1000 MCG tablet Take 1,000 mcg by mouth daily.   Yes [provider]  Ferrous Bisglycinate Chelate 15 MG TABS Take 1 tablet by mouth 2 (two) times daily. Patient not taking: Reported on 02/12/2018 10/20/17   Rodolph Bong, MD  fosfomycin (MONUROL) 3 g PACK Take 3 g by mouth once for 1 dose. 05/19/19 05/19/19  Jamey Reas, MD  levothyroxine (SYNTHROID, LEVOTHROID) 50 MCG tablet Take 1 tablet (50 mcg total) by mouth daily. Patient not taking: Reported on 05/19/2019 05/24/17   Rodolph Bong, MD  metoprolol succinate (TOPROL-XL) 50 MG 24 hr tablet Take 1 tablet (50 mg total) by mouth  daily. Patient not taking: Reported on 05/19/2019 05/24/17   Rodolph Bong, MD  PARoxetine (PAXIL) 10 MG tablet Take 1 tablet (10 mg total) by mouth daily. Patient not taking: Reported on 02/12/2018 01/16/18   Rodolph Bong, MD  traZODone (DESYREL) 50 MG tablet Take 0.5-1 tablets (25-50 mg total) by mouth at bedtime as needed for sleep. Patient not taking: Reported on 02/12/2018 12/01/16   Rodolph Bong, MD    Allergies    Cefuroxime, Fish allergy, Ciprofloxacin, Codeine, Sertraline hcl, Cephalexin, and Paxil [paroxetine hcl]  Review of Systems   Review of Systems  Constitutional: Negative for chills and fever.  HENT: Negative for ear pain and sore throat.        Frontal hematoma  Eyes: Negative for pain and visual disturbance.  Respiratory: Negative for cough and shortness of breath.   Cardiovascular: Negative for chest pain and palpitations.  Gastrointestinal: Negative for abdominal pain and vomiting.  Genitourinary: Negative for dysuria and hematuria.  Musculoskeletal: Positive for arthralgias. Negative for back pain.  Skin: Negative for color change and rash.  Neurological: Negative for seizures and syncope.  All other systems reviewed and are negative.   Physical Exam Updated Vital Signs BP (!) 196/81 (BP Location: Right Arm)   Pulse (!) 108   Temp 98.4 F (36.9 C) (Oral)   Resp 20   SpO2 97%   Physical Exam Vitals and nursing note reviewed.  Constitutional:      General: She is not in acute distress.    Appearance: She is well-developed.     Comments: Elderly, frail  HENT:     Head: Normocephalic.     Comments: Right frontal hematoma present No underlying crepitus or open wound    Right Ear: External ear normal.     Left Ear: External ear normal.     Mouth/Throat:     Mouth: Mucous membranes are moist.  Pharynx: Oropharynx is clear.  Eyes:     Conjunctiva/sclera: Conjunctivae normal.  Neck:     Vascular: No carotid bruit.  Cardiovascular:     Rate and Rhythm:  Normal rate and regular rhythm.     Heart sounds: No murmur.  Pulmonary:     Effort: Pulmonary effort is normal. No respiratory distress.     Breath sounds: Normal breath sounds.  Abdominal:     Palpations: Abdomen is soft.     Tenderness: There is no abdominal tenderness.  Musculoskeletal:        General: Tenderness (mild TTP to right knee, mild bruising noted) present. No swelling. Normal range of motion.     Cervical back: Normal range of motion and neck supple. No rigidity or tenderness.  Lymphadenopathy:     Cervical: No cervical adenopathy.  Skin:    General: Skin is warm and dry.     Capillary Refill: Capillary refill takes less than 2 seconds.  Neurological:     General: No focal deficit present.     Mental Status: She is alert and oriented to person, place, and time.     Cranial Nerves: No cranial nerve deficit.     Sensory: No sensory deficit.     Motor: No weakness.     Coordination: Coordination normal.     Gait: Gait normal.  Psychiatric:        Mood and Affect: Mood normal.        Behavior: Behavior normal.     ED Results / Procedures / Treatments   Labs (all labs ordered are listed, but only abnormal results are displayed) Labs Reviewed  PROTIME-INR - Abnormal; Notable for the following components:      Result Value   Prothrombin Time 17.2 (*)    INR 1.4 (*)    All other components within normal limits  CBC WITH DIFFERENTIAL/PLATELET - Abnormal; Notable for the following components:   RBC 3.79 (*)    All other components within normal limits  COMPREHENSIVE METABOLIC PANEL - Abnormal; Notable for the following components:   Glucose, Bld 176 (*)    BUN 26 (*)    Creatinine, Ser 1.02 (*)    Albumin 2.7 (*)    GFR calc non Af Amer 49 (*)    GFR calc Af Amer 57 (*)    All other components within normal limits  URINALYSIS, ROUTINE W REFLEX MICROSCOPIC - Abnormal; Notable for the following components:   APPearance CLOUDY (*)    Hgb urine dipstick SMALL (*)     Protein, ur 100 (*)    Leukocytes,Ua MODERATE (*)    WBC, UA >50 (*)    Bacteria, UA MANY (*)    All other components within normal limits  URINE CULTURE    EKG EKG Interpretation  Date/Time:  Sunday May 19 2019 17:09:46 EST Ventricular Rate:  102 PR Interval:    QRS Duration: 98 QT Interval:  326 QTC Calculation: 424 R Axis:   55 Text Interpretation: Atrial fibrillation with rapid ventricular response with premature ventricular or aberrantly conducted complexes Minimal voltage criteria for LVH, may be normal variant ( Cornell product ) Nonspecific ST and T wave abnormality Abnormal ECG No significant change since last tracing Confirmed by Richardean Canal [phone]) on 05/19/2019 5:41:38 PM   Radiology DG Chest 1 View  Result Date: 05/19/2019 CLINICAL DATA:  Status post fall. EXAM: CHEST  1 VIEW COMPARISON:  February 12, 2018 FINDINGS: Stable moderate severity chronic appearing increased interstitial  lung markings are seen. There is no evidence of acute infiltrate, pleural effusion or pneumothorax. There is moderate to marked severity enlargement of the cardiac silhouette. Moderate to marked severity levoscoliosis of the thoracic spine is seen with multilevel degenerative changes. IMPRESSION: Stable exam without evidence of acute or active cardiopulmonary disease. Electronically Signed   By: Aram Candela M.D.   On: 05/19/2019 18:33   DG Pelvis 1-2 Views  Result Date: 05/19/2019 CLINICAL DATA:  Status post fall. EXAM: PELVIS - 1-2 VIEW COMPARISON:  None. FINDINGS: There is no evidence of an acute pelvic fracture or diastasis. Chronic appearing deformities are seen involving the bilateral inferior pubic rami. A radiopaque intramedullary rod and compression screw device are seen within the proximal left femur. Moderate severity degenerative changes seen involving both hips and the visualized portion of the lower lumbar spine. Radiopaque surgical clips are seen overlying the pelvis.  IMPRESSION: No evidence of an acute pelvic fracture or diastasis. Electronically Signed   By: Aram Candela M.D.   On: 05/19/2019 18:24   DG Knee 2 Views Right  Result Date: 05/19/2019 CLINICAL DATA:  Status post fall. EXAM: RIGHT KNEE - 1-2 VIEW COMPARISON:  January 17, 2018 FINDINGS: No evidence of acute fracture or dislocation. A right knee replacement is seen without evidence of surrounding lucency to suggest the presence of hardware loosening or infection. A small joint effusion is seen. Mild medial soft tissue swelling is also noted. There is mild vascular calcification. IMPRESSION: Intact right knee replacement without evidence of an acute fracture or dislocation. Electronically Signed   By: Aram Candela M.D.   On: 05/19/2019 18:39   CT Head Wo Contrast  Result Date: 05/19/2019 CLINICAL DATA:  Unwitnessed fall EXAM: CT HEAD WITHOUT CONTRAST TECHNIQUE: Contiguous axial images were obtained from the base of the skull through the vertex without intravenous contrast. COMPARISON:  10 19 FINDINGS: Brain: There is atrophy and chronic small vessel disease changes. No acute intracranial abnormality. Specifically, no hemorrhage, hydrocephalus, mass lesion, acute infarction, or significant intracranial injury. Vascular: No hyperdense vessel or unexpected calcification. Skull: No acute calvarial abnormality. Sinuses/Orbits: Visualized paranasal sinuses and mastoids clear. Orbital soft tissues unremarkable. Other: Large scalp hematoma in the right forehead region. IMPRESSION: Atrophy, chronic microvascular disease. No acute intracranial abnormality. Electronically Signed   By: Charlett Nose M.D.   On: 05/19/2019 18:10   CT Cervical Spine Wo Contrast  Result Date: 05/19/2019 CLINICAL DATA:  Unwitnessed fall EXAM: CT CERVICAL SPINE WITHOUT CONTRAST TECHNIQUE: Multidetector CT imaging of the cervical spine was performed without intravenous contrast. Multiplanar CT image reconstructions were also generated.  COMPARISON:  None. FINDINGS: Alignment: Slight anterolisthesis of C4 on C5 and C7 on T1 related to facet disease. Skull base and vertebrae: No acute fracture. No primary bone lesion or focal pathologic process. Soft tissues and spinal canal: No prevertebral fluid or swelling. No visible canal hematoma. Disc levels:  Advanced diffuse degenerative facet and disc disease. Upper chest: No acute findings Other: Carotid artery calcifications. IMPRESSION: Advanced degenerative disc and facet disease. No acute bony abnormality. Electronically Signed   By: Charlett Nose M.D.   On: 05/19/2019 18:12    Procedures Procedures (including critical care time)  Medications Ordered in ED Medications  fosfomycin (MONUROL) packet 3 g (has no administration in time range)  oxyCODONE-acetaminophen (PERCOCET/ROXICET) 5-325 MG per tablet 1 tablet (1 tablet Oral Given 05/19/19 1659)    ED Course  I have reviewed the triage vital signs and the nursing notes.  Pertinent  labs & imaging results that were available during my care of the patient were reviewed by me and considered in my medical decision making (see chart for details).    MDM Rules/Calculators/A&P                      86 year old female presenting with fall.  On arrival hemodynamically stable, afebrile and well-appearing.  Patient has a hematoma to the right forehead, see physical exam.  CT head and C-spine were both negative for any acute abnormalities on my review, EKG with A. fib, rates in the low 100s.  Patient with a subtherapeutic INR at 1.4, otherwise labs reassuring.  Urine studies significant for likely UTI, patient given fosfomycin in the ED.  Patient has no injuries currently, knee x-ray was negative as well, patient ambulating at her baseline, okay for discharge home.  Sent home with fosfomycin as well.  Return precautions given, daughter agreed understood plan, discharged in good condition.  The attending physician was present and available for all  medical decision making and procedures related to this patient's care.  Final Clinical Impression(s) / ED Diagnoses Final diagnoses:  Fall, initial encounter  Acute cystitis without hematuria    Rx / DC Orders ED Discharge Orders         Ordered    fosfomycin (MONUROL) 3 g PACK   Once     05/19/19 1923           Kizzie Fantasia, MD 05/19/19 Doran Heater    Drenda Freeze, MD 05/22/19 [phone]

## 2019-05-20 DIAGNOSIS — M069 Rheumatoid arthritis, unspecified: Secondary | ICD-10-CM | POA: Diagnosis not present

## 2019-05-20 DIAGNOSIS — N390 Urinary tract infection, site not specified: Secondary | ICD-10-CM | POA: Diagnosis not present

## 2019-05-20 DIAGNOSIS — E039 Hypothyroidism, unspecified: Secondary | ICD-10-CM | POA: Diagnosis not present

## 2019-05-20 DIAGNOSIS — I4891 Unspecified atrial fibrillation: Secondary | ICD-10-CM | POA: Diagnosis not present

## 2019-05-20 DIAGNOSIS — E1122 Type 2 diabetes mellitus with diabetic chronic kidney disease: Secondary | ICD-10-CM | POA: Diagnosis not present

## 2019-05-20 DIAGNOSIS — R2681 Unsteadiness on feet: Secondary | ICD-10-CM | POA: Diagnosis not present

## 2019-05-20 DIAGNOSIS — I10 Essential (primary) hypertension: Secondary | ICD-10-CM | POA: Diagnosis not present

## 2019-05-20 DIAGNOSIS — K219 Gastro-esophageal reflux disease without esophagitis: Secondary | ICD-10-CM | POA: Diagnosis not present

## 2019-05-20 DIAGNOSIS — S0083XD Contusion of other part of head, subsequent encounter: Secondary | ICD-10-CM | POA: Diagnosis not present

## 2019-05-20 DIAGNOSIS — U071 COVID-19: Secondary | ICD-10-CM | POA: Diagnosis not present

## 2019-05-20 DIAGNOSIS — R2689 Other abnormalities of gait and mobility: Secondary | ICD-10-CM | POA: Diagnosis not present

## 2019-05-20 DIAGNOSIS — W19XXXD Unspecified fall, subsequent encounter: Secondary | ICD-10-CM | POA: Diagnosis not present

## 2019-05-21 DIAGNOSIS — U071 COVID-19: Secondary | ICD-10-CM | POA: Diagnosis not present

## 2019-05-21 DIAGNOSIS — E039 Hypothyroidism, unspecified: Secondary | ICD-10-CM | POA: Diagnosis not present

## 2019-05-21 DIAGNOSIS — R2689 Other abnormalities of gait and mobility: Secondary | ICD-10-CM | POA: Diagnosis not present

## 2019-05-21 DIAGNOSIS — R2681 Unsteadiness on feet: Secondary | ICD-10-CM | POA: Diagnosis not present

## 2019-05-21 DIAGNOSIS — I4891 Unspecified atrial fibrillation: Secondary | ICD-10-CM | POA: Diagnosis not present

## 2019-05-21 DIAGNOSIS — K219 Gastro-esophageal reflux disease without esophagitis: Secondary | ICD-10-CM | POA: Diagnosis not present

## 2019-05-21 DIAGNOSIS — I10 Essential (primary) hypertension: Secondary | ICD-10-CM | POA: Diagnosis not present

## 2019-05-21 DIAGNOSIS — M069 Rheumatoid arthritis, unspecified: Secondary | ICD-10-CM | POA: Diagnosis not present

## 2019-05-21 DIAGNOSIS — E1122 Type 2 diabetes mellitus with diabetic chronic kidney disease: Secondary | ICD-10-CM | POA: Diagnosis not present

## 2019-05-21 LAB — URINE CULTURE

## 2019-05-22 DIAGNOSIS — I10 Essential (primary) hypertension: Secondary | ICD-10-CM | POA: Diagnosis not present

## 2019-05-22 DIAGNOSIS — I4891 Unspecified atrial fibrillation: Secondary | ICD-10-CM | POA: Diagnosis not present

## 2019-05-22 DIAGNOSIS — R2689 Other abnormalities of gait and mobility: Secondary | ICD-10-CM | POA: Diagnosis not present

## 2019-05-22 DIAGNOSIS — E039 Hypothyroidism, unspecified: Secondary | ICD-10-CM | POA: Diagnosis not present

## 2019-05-22 DIAGNOSIS — M069 Rheumatoid arthritis, unspecified: Secondary | ICD-10-CM | POA: Diagnosis not present

## 2019-05-22 DIAGNOSIS — F0151 Vascular dementia with behavioral disturbance: Secondary | ICD-10-CM | POA: Diagnosis not present

## 2019-05-22 DIAGNOSIS — E1122 Type 2 diabetes mellitus with diabetic chronic kidney disease: Secondary | ICD-10-CM | POA: Diagnosis not present

## 2019-05-22 DIAGNOSIS — R2681 Unsteadiness on feet: Secondary | ICD-10-CM | POA: Diagnosis not present

## 2019-05-22 DIAGNOSIS — F0632 Mood disorder due to known physiological condition with major depressive-like episode: Secondary | ICD-10-CM | POA: Diagnosis not present

## 2019-05-22 DIAGNOSIS — U071 COVID-19: Secondary | ICD-10-CM | POA: Diagnosis not present

## 2019-05-22 DIAGNOSIS — K219 Gastro-esophageal reflux disease without esophagitis: Secondary | ICD-10-CM | POA: Diagnosis not present

## 2019-05-23 DIAGNOSIS — E039 Hypothyroidism, unspecified: Secondary | ICD-10-CM | POA: Diagnosis not present

## 2019-05-23 DIAGNOSIS — I4891 Unspecified atrial fibrillation: Secondary | ICD-10-CM | POA: Diagnosis not present

## 2019-05-23 DIAGNOSIS — R2689 Other abnormalities of gait and mobility: Secondary | ICD-10-CM | POA: Diagnosis not present

## 2019-05-23 DIAGNOSIS — M069 Rheumatoid arthritis, unspecified: Secondary | ICD-10-CM | POA: Diagnosis not present

## 2019-05-23 DIAGNOSIS — K219 Gastro-esophageal reflux disease without esophagitis: Secondary | ICD-10-CM | POA: Diagnosis not present

## 2019-05-23 DIAGNOSIS — R2681 Unsteadiness on feet: Secondary | ICD-10-CM | POA: Diagnosis not present

## 2019-05-23 DIAGNOSIS — U071 COVID-19: Secondary | ICD-10-CM | POA: Diagnosis not present

## 2019-05-23 DIAGNOSIS — I10 Essential (primary) hypertension: Secondary | ICD-10-CM | POA: Diagnosis not present

## 2019-05-23 DIAGNOSIS — E1122 Type 2 diabetes mellitus with diabetic chronic kidney disease: Secondary | ICD-10-CM | POA: Diagnosis not present

## 2019-05-24 DIAGNOSIS — I10 Essential (primary) hypertension: Secondary | ICD-10-CM | POA: Diagnosis not present

## 2019-05-24 DIAGNOSIS — K219 Gastro-esophageal reflux disease without esophagitis: Secondary | ICD-10-CM | POA: Diagnosis not present

## 2019-05-24 DIAGNOSIS — E1122 Type 2 diabetes mellitus with diabetic chronic kidney disease: Secondary | ICD-10-CM | POA: Diagnosis not present

## 2019-05-24 DIAGNOSIS — R2681 Unsteadiness on feet: Secondary | ICD-10-CM | POA: Diagnosis not present

## 2019-05-24 DIAGNOSIS — M069 Rheumatoid arthritis, unspecified: Secondary | ICD-10-CM | POA: Diagnosis not present

## 2019-05-24 DIAGNOSIS — E039 Hypothyroidism, unspecified: Secondary | ICD-10-CM | POA: Diagnosis not present

## 2019-05-24 DIAGNOSIS — I4891 Unspecified atrial fibrillation: Secondary | ICD-10-CM | POA: Diagnosis not present

## 2019-05-24 DIAGNOSIS — U071 COVID-19: Secondary | ICD-10-CM | POA: Diagnosis not present

## 2019-05-24 DIAGNOSIS — R2689 Other abnormalities of gait and mobility: Secondary | ICD-10-CM | POA: Diagnosis not present

## 2019-05-26 DIAGNOSIS — F419 Anxiety disorder, unspecified: Secondary | ICD-10-CM | POA: Diagnosis not present

## 2019-05-26 DIAGNOSIS — U071 COVID-19: Secondary | ICD-10-CM | POA: Diagnosis not present

## 2019-05-26 DIAGNOSIS — S0083XD Contusion of other part of head, subsequent encounter: Secondary | ICD-10-CM | POA: Diagnosis not present

## 2019-05-26 DIAGNOSIS — N390 Urinary tract infection, site not specified: Secondary | ICD-10-CM | POA: Diagnosis not present

## 2019-05-26 DIAGNOSIS — I4821 Permanent atrial fibrillation: Secondary | ICD-10-CM | POA: Diagnosis not present

## 2019-05-26 DIAGNOSIS — W19XXXD Unspecified fall, subsequent encounter: Secondary | ICD-10-CM | POA: Diagnosis not present

## 2019-05-26 DIAGNOSIS — I5032 Chronic diastolic (congestive) heart failure: Secondary | ICD-10-CM | POA: Diagnosis not present

## 2019-05-28 DIAGNOSIS — U071 COVID-19: Secondary | ICD-10-CM | POA: Diagnosis not present

## 2019-05-28 DIAGNOSIS — E039 Hypothyroidism, unspecified: Secondary | ICD-10-CM | POA: Diagnosis not present

## 2019-05-28 DIAGNOSIS — R2689 Other abnormalities of gait and mobility: Secondary | ICD-10-CM | POA: Diagnosis not present

## 2019-05-28 DIAGNOSIS — R2681 Unsteadiness on feet: Secondary | ICD-10-CM | POA: Diagnosis not present

## 2019-05-28 DIAGNOSIS — I4891 Unspecified atrial fibrillation: Secondary | ICD-10-CM | POA: Diagnosis not present

## 2019-05-28 DIAGNOSIS — E1122 Type 2 diabetes mellitus with diabetic chronic kidney disease: Secondary | ICD-10-CM | POA: Diagnosis not present

## 2019-05-28 DIAGNOSIS — I10 Essential (primary) hypertension: Secondary | ICD-10-CM | POA: Diagnosis not present

## 2019-05-28 DIAGNOSIS — M069 Rheumatoid arthritis, unspecified: Secondary | ICD-10-CM | POA: Diagnosis not present

## 2019-05-28 DIAGNOSIS — K219 Gastro-esophageal reflux disease without esophagitis: Secondary | ICD-10-CM | POA: Diagnosis not present

## 2019-05-29 DIAGNOSIS — E1122 Type 2 diabetes mellitus with diabetic chronic kidney disease: Secondary | ICD-10-CM | POA: Diagnosis not present

## 2019-05-29 DIAGNOSIS — I4891 Unspecified atrial fibrillation: Secondary | ICD-10-CM | POA: Diagnosis not present

## 2019-05-29 DIAGNOSIS — I10 Essential (primary) hypertension: Secondary | ICD-10-CM | POA: Diagnosis not present

## 2019-05-29 DIAGNOSIS — K219 Gastro-esophageal reflux disease without esophagitis: Secondary | ICD-10-CM | POA: Diagnosis not present

## 2019-05-29 DIAGNOSIS — U071 COVID-19: Secondary | ICD-10-CM | POA: Diagnosis not present

## 2019-05-29 DIAGNOSIS — E039 Hypothyroidism, unspecified: Secondary | ICD-10-CM | POA: Diagnosis not present

## 2019-05-29 DIAGNOSIS — R2689 Other abnormalities of gait and mobility: Secondary | ICD-10-CM | POA: Diagnosis not present

## 2019-05-29 DIAGNOSIS — M069 Rheumatoid arthritis, unspecified: Secondary | ICD-10-CM | POA: Diagnosis not present

## 2019-05-29 DIAGNOSIS — R2681 Unsteadiness on feet: Secondary | ICD-10-CM | POA: Diagnosis not present

## 2019-05-30 DIAGNOSIS — E039 Hypothyroidism, unspecified: Secondary | ICD-10-CM | POA: Diagnosis not present

## 2019-05-30 DIAGNOSIS — M069 Rheumatoid arthritis, unspecified: Secondary | ICD-10-CM | POA: Diagnosis not present

## 2019-05-30 DIAGNOSIS — U071 COVID-19: Secondary | ICD-10-CM | POA: Diagnosis not present

## 2019-05-30 DIAGNOSIS — K219 Gastro-esophageal reflux disease without esophagitis: Secondary | ICD-10-CM | POA: Diagnosis not present

## 2019-05-30 DIAGNOSIS — E1122 Type 2 diabetes mellitus with diabetic chronic kidney disease: Secondary | ICD-10-CM | POA: Diagnosis not present

## 2019-05-30 DIAGNOSIS — R2681 Unsteadiness on feet: Secondary | ICD-10-CM | POA: Diagnosis not present

## 2019-05-30 DIAGNOSIS — R2689 Other abnormalities of gait and mobility: Secondary | ICD-10-CM | POA: Diagnosis not present

## 2019-05-30 DIAGNOSIS — I4891 Unspecified atrial fibrillation: Secondary | ICD-10-CM | POA: Diagnosis not present

## 2019-05-30 DIAGNOSIS — I10 Essential (primary) hypertension: Secondary | ICD-10-CM | POA: Diagnosis not present

## 2019-05-31 DIAGNOSIS — R2681 Unsteadiness on feet: Secondary | ICD-10-CM | POA: Diagnosis not present

## 2019-05-31 DIAGNOSIS — E039 Hypothyroidism, unspecified: Secondary | ICD-10-CM | POA: Diagnosis not present

## 2019-05-31 DIAGNOSIS — R2689 Other abnormalities of gait and mobility: Secondary | ICD-10-CM | POA: Diagnosis not present

## 2019-05-31 DIAGNOSIS — E1122 Type 2 diabetes mellitus with diabetic chronic kidney disease: Secondary | ICD-10-CM | POA: Diagnosis not present

## 2019-05-31 DIAGNOSIS — K219 Gastro-esophageal reflux disease without esophagitis: Secondary | ICD-10-CM | POA: Diagnosis not present

## 2019-05-31 DIAGNOSIS — I10 Essential (primary) hypertension: Secondary | ICD-10-CM | POA: Diagnosis not present

## 2019-05-31 DIAGNOSIS — I4891 Unspecified atrial fibrillation: Secondary | ICD-10-CM | POA: Diagnosis not present

## 2019-05-31 DIAGNOSIS — U071 COVID-19: Secondary | ICD-10-CM | POA: Diagnosis not present

## 2019-05-31 DIAGNOSIS — M069 Rheumatoid arthritis, unspecified: Secondary | ICD-10-CM | POA: Diagnosis not present

## 2019-06-01 DIAGNOSIS — I5032 Chronic diastolic (congestive) heart failure: Secondary | ICD-10-CM | POA: Diagnosis not present

## 2019-06-01 DIAGNOSIS — I10 Essential (primary) hypertension: Secondary | ICD-10-CM | POA: Diagnosis not present

## 2019-06-01 DIAGNOSIS — I4821 Permanent atrial fibrillation: Secondary | ICD-10-CM | POA: Diagnosis not present

## 2019-06-01 DIAGNOSIS — F0390 Unspecified dementia without behavioral disturbance: Secondary | ICD-10-CM | POA: Diagnosis not present

## 2019-06-01 DIAGNOSIS — S0083XD Contusion of other part of head, subsequent encounter: Secondary | ICD-10-CM | POA: Diagnosis not present

## 2019-06-03 DIAGNOSIS — K219 Gastro-esophageal reflux disease without esophagitis: Secondary | ICD-10-CM | POA: Diagnosis not present

## 2019-06-03 DIAGNOSIS — M069 Rheumatoid arthritis, unspecified: Secondary | ICD-10-CM | POA: Diagnosis not present

## 2019-06-03 DIAGNOSIS — I10 Essential (primary) hypertension: Secondary | ICD-10-CM | POA: Diagnosis not present

## 2019-06-03 DIAGNOSIS — E039 Hypothyroidism, unspecified: Secondary | ICD-10-CM | POA: Diagnosis not present

## 2019-06-03 DIAGNOSIS — R2681 Unsteadiness on feet: Secondary | ICD-10-CM | POA: Diagnosis not present

## 2019-06-03 DIAGNOSIS — U071 COVID-19: Secondary | ICD-10-CM | POA: Diagnosis not present

## 2019-06-03 DIAGNOSIS — E1122 Type 2 diabetes mellitus with diabetic chronic kidney disease: Secondary | ICD-10-CM | POA: Diagnosis not present

## 2019-06-03 DIAGNOSIS — I4891 Unspecified atrial fibrillation: Secondary | ICD-10-CM | POA: Diagnosis not present

## 2019-06-03 DIAGNOSIS — R2689 Other abnormalities of gait and mobility: Secondary | ICD-10-CM | POA: Diagnosis not present

## 2019-06-04 DIAGNOSIS — I4891 Unspecified atrial fibrillation: Secondary | ICD-10-CM | POA: Diagnosis not present

## 2019-06-04 DIAGNOSIS — E039 Hypothyroidism, unspecified: Secondary | ICD-10-CM | POA: Diagnosis not present

## 2019-06-04 DIAGNOSIS — R2681 Unsteadiness on feet: Secondary | ICD-10-CM | POA: Diagnosis not present

## 2019-06-04 DIAGNOSIS — U071 COVID-19: Secondary | ICD-10-CM | POA: Diagnosis not present

## 2019-06-04 DIAGNOSIS — M069 Rheumatoid arthritis, unspecified: Secondary | ICD-10-CM | POA: Diagnosis not present

## 2019-06-04 DIAGNOSIS — E1122 Type 2 diabetes mellitus with diabetic chronic kidney disease: Secondary | ICD-10-CM | POA: Diagnosis not present

## 2019-06-04 DIAGNOSIS — R2689 Other abnormalities of gait and mobility: Secondary | ICD-10-CM | POA: Diagnosis not present

## 2019-06-04 DIAGNOSIS — I10 Essential (primary) hypertension: Secondary | ICD-10-CM | POA: Diagnosis not present

## 2019-06-04 DIAGNOSIS — K219 Gastro-esophageal reflux disease without esophagitis: Secondary | ICD-10-CM | POA: Diagnosis not present

## 2019-06-05 DIAGNOSIS — K219 Gastro-esophageal reflux disease without esophagitis: Secondary | ICD-10-CM | POA: Diagnosis not present

## 2019-06-05 DIAGNOSIS — E119 Type 2 diabetes mellitus without complications: Secondary | ICD-10-CM | POA: Diagnosis not present

## 2019-06-05 DIAGNOSIS — M069 Rheumatoid arthritis, unspecified: Secondary | ICD-10-CM | POA: Diagnosis not present

## 2019-06-05 DIAGNOSIS — R2689 Other abnormalities of gait and mobility: Secondary | ICD-10-CM | POA: Diagnosis not present

## 2019-06-05 DIAGNOSIS — R2681 Unsteadiness on feet: Secondary | ICD-10-CM | POA: Diagnosis not present

## 2019-06-05 DIAGNOSIS — U071 COVID-19: Secondary | ICD-10-CM | POA: Diagnosis not present

## 2019-06-05 DIAGNOSIS — I4891 Unspecified atrial fibrillation: Secondary | ICD-10-CM | POA: Diagnosis not present

## 2019-06-05 DIAGNOSIS — I10 Essential (primary) hypertension: Secondary | ICD-10-CM | POA: Diagnosis not present

## 2019-06-05 DIAGNOSIS — E1122 Type 2 diabetes mellitus with diabetic chronic kidney disease: Secondary | ICD-10-CM | POA: Diagnosis not present

## 2019-06-05 DIAGNOSIS — E039 Hypothyroidism, unspecified: Secondary | ICD-10-CM | POA: Diagnosis not present

## 2019-06-18 DIAGNOSIS — F0632 Mood disorder due to known physiological condition with major depressive-like episode: Secondary | ICD-10-CM | POA: Diagnosis not present

## 2019-06-18 DIAGNOSIS — F331 Major depressive disorder, recurrent, moderate: Secondary | ICD-10-CM | POA: Diagnosis not present

## 2019-06-18 DIAGNOSIS — F0151 Vascular dementia with behavioral disturbance: Secondary | ICD-10-CM | POA: Diagnosis not present

## 2019-06-26 DIAGNOSIS — I10 Essential (primary) hypertension: Secondary | ICD-10-CM | POA: Diagnosis not present

## 2019-06-26 DIAGNOSIS — M069 Rheumatoid arthritis, unspecified: Secondary | ICD-10-CM | POA: Diagnosis not present

## 2019-06-26 DIAGNOSIS — I4891 Unspecified atrial fibrillation: Secondary | ICD-10-CM | POA: Diagnosis not present

## 2019-06-27 DIAGNOSIS — I5032 Chronic diastolic (congestive) heart failure: Secondary | ICD-10-CM | POA: Diagnosis not present

## 2019-06-27 DIAGNOSIS — F0390 Unspecified dementia without behavioral disturbance: Secondary | ICD-10-CM | POA: Diagnosis not present

## 2019-06-27 DIAGNOSIS — I482 Chronic atrial fibrillation, unspecified: Secondary | ICD-10-CM | POA: Diagnosis not present

## 2019-06-27 DIAGNOSIS — F419 Anxiety disorder, unspecified: Secondary | ICD-10-CM | POA: Diagnosis not present

## 2019-06-27 DIAGNOSIS — M069 Rheumatoid arthritis, unspecified: Secondary | ICD-10-CM | POA: Diagnosis not present

## 2019-06-27 DIAGNOSIS — W19XXXD Unspecified fall, subsequent encounter: Secondary | ICD-10-CM | POA: Diagnosis not present

## 2019-06-27 DIAGNOSIS — F329 Major depressive disorder, single episode, unspecified: Secondary | ICD-10-CM | POA: Diagnosis not present

## 2019-07-02 DIAGNOSIS — M25512 Pain in left shoulder: Secondary | ICD-10-CM | POA: Diagnosis not present

## 2019-07-04 DIAGNOSIS — E039 Hypothyroidism, unspecified: Secondary | ICD-10-CM | POA: Diagnosis not present

## 2019-07-04 DIAGNOSIS — R2681 Unsteadiness on feet: Secondary | ICD-10-CM | POA: Diagnosis not present

## 2019-07-04 DIAGNOSIS — K219 Gastro-esophageal reflux disease without esophagitis: Secondary | ICD-10-CM | POA: Diagnosis not present

## 2019-07-04 DIAGNOSIS — R2689 Other abnormalities of gait and mobility: Secondary | ICD-10-CM | POA: Diagnosis not present

## 2019-07-04 DIAGNOSIS — I10 Essential (primary) hypertension: Secondary | ICD-10-CM | POA: Diagnosis not present

## 2019-07-04 DIAGNOSIS — M069 Rheumatoid arthritis, unspecified: Secondary | ICD-10-CM | POA: Diagnosis not present

## 2019-07-04 DIAGNOSIS — E1122 Type 2 diabetes mellitus with diabetic chronic kidney disease: Secondary | ICD-10-CM | POA: Diagnosis not present

## 2019-07-04 DIAGNOSIS — D631 Anemia in chronic kidney disease: Secondary | ICD-10-CM | POA: Diagnosis not present

## 2019-07-04 DIAGNOSIS — I4891 Unspecified atrial fibrillation: Secondary | ICD-10-CM | POA: Diagnosis not present

## 2019-07-11 DIAGNOSIS — M069 Rheumatoid arthritis, unspecified: Secondary | ICD-10-CM | POA: Diagnosis not present

## 2019-07-11 DIAGNOSIS — K219 Gastro-esophageal reflux disease without esophagitis: Secondary | ICD-10-CM | POA: Diagnosis not present

## 2019-07-11 DIAGNOSIS — E1122 Type 2 diabetes mellitus with diabetic chronic kidney disease: Secondary | ICD-10-CM | POA: Diagnosis not present

## 2019-07-11 DIAGNOSIS — R2681 Unsteadiness on feet: Secondary | ICD-10-CM | POA: Diagnosis not present

## 2019-07-11 DIAGNOSIS — D631 Anemia in chronic kidney disease: Secondary | ICD-10-CM | POA: Diagnosis not present

## 2019-07-11 DIAGNOSIS — I4891 Unspecified atrial fibrillation: Secondary | ICD-10-CM | POA: Diagnosis not present

## 2019-07-11 DIAGNOSIS — E039 Hypothyroidism, unspecified: Secondary | ICD-10-CM | POA: Diagnosis not present

## 2019-07-11 DIAGNOSIS — R2689 Other abnormalities of gait and mobility: Secondary | ICD-10-CM | POA: Diagnosis not present

## 2019-07-11 DIAGNOSIS — I10 Essential (primary) hypertension: Secondary | ICD-10-CM | POA: Diagnosis not present

## 2019-07-12 DIAGNOSIS — D631 Anemia in chronic kidney disease: Secondary | ICD-10-CM | POA: Diagnosis not present

## 2019-07-12 DIAGNOSIS — R2681 Unsteadiness on feet: Secondary | ICD-10-CM | POA: Diagnosis not present

## 2019-07-12 DIAGNOSIS — I10 Essential (primary) hypertension: Secondary | ICD-10-CM | POA: Diagnosis not present

## 2019-07-12 DIAGNOSIS — I4891 Unspecified atrial fibrillation: Secondary | ICD-10-CM | POA: Diagnosis not present

## 2019-07-12 DIAGNOSIS — E039 Hypothyroidism, unspecified: Secondary | ICD-10-CM | POA: Diagnosis not present

## 2019-07-12 DIAGNOSIS — M069 Rheumatoid arthritis, unspecified: Secondary | ICD-10-CM | POA: Diagnosis not present

## 2019-07-12 DIAGNOSIS — K219 Gastro-esophageal reflux disease without esophagitis: Secondary | ICD-10-CM | POA: Diagnosis not present

## 2019-07-12 DIAGNOSIS — E1122 Type 2 diabetes mellitus with diabetic chronic kidney disease: Secondary | ICD-10-CM | POA: Diagnosis not present

## 2019-07-12 DIAGNOSIS — R2689 Other abnormalities of gait and mobility: Secondary | ICD-10-CM | POA: Diagnosis not present

## 2019-07-13 DIAGNOSIS — R319 Hematuria, unspecified: Secondary | ICD-10-CM | POA: Diagnosis not present

## 2019-07-13 DIAGNOSIS — N390 Urinary tract infection, site not specified: Secondary | ICD-10-CM | POA: Diagnosis not present

## 2019-07-15 DIAGNOSIS — E1122 Type 2 diabetes mellitus with diabetic chronic kidney disease: Secondary | ICD-10-CM | POA: Diagnosis not present

## 2019-07-15 DIAGNOSIS — M069 Rheumatoid arthritis, unspecified: Secondary | ICD-10-CM | POA: Diagnosis not present

## 2019-07-15 DIAGNOSIS — R2689 Other abnormalities of gait and mobility: Secondary | ICD-10-CM | POA: Diagnosis not present

## 2019-07-15 DIAGNOSIS — I10 Essential (primary) hypertension: Secondary | ICD-10-CM | POA: Diagnosis not present

## 2019-07-15 DIAGNOSIS — K219 Gastro-esophageal reflux disease without esophagitis: Secondary | ICD-10-CM | POA: Diagnosis not present

## 2019-07-15 DIAGNOSIS — I4891 Unspecified atrial fibrillation: Secondary | ICD-10-CM | POA: Diagnosis not present

## 2019-07-15 DIAGNOSIS — E039 Hypothyroidism, unspecified: Secondary | ICD-10-CM | POA: Diagnosis not present

## 2019-07-15 DIAGNOSIS — D631 Anemia in chronic kidney disease: Secondary | ICD-10-CM | POA: Diagnosis not present

## 2019-07-15 DIAGNOSIS — R2681 Unsteadiness on feet: Secondary | ICD-10-CM | POA: Diagnosis not present

## 2019-07-16 DIAGNOSIS — I4891 Unspecified atrial fibrillation: Secondary | ICD-10-CM | POA: Diagnosis not present

## 2019-07-16 DIAGNOSIS — M069 Rheumatoid arthritis, unspecified: Secondary | ICD-10-CM | POA: Diagnosis not present

## 2019-07-16 DIAGNOSIS — E039 Hypothyroidism, unspecified: Secondary | ICD-10-CM | POA: Diagnosis not present

## 2019-07-16 DIAGNOSIS — I10 Essential (primary) hypertension: Secondary | ICD-10-CM | POA: Diagnosis not present

## 2019-07-16 DIAGNOSIS — R2689 Other abnormalities of gait and mobility: Secondary | ICD-10-CM | POA: Diagnosis not present

## 2019-07-16 DIAGNOSIS — R2681 Unsteadiness on feet: Secondary | ICD-10-CM | POA: Diagnosis not present

## 2019-07-16 DIAGNOSIS — E1122 Type 2 diabetes mellitus with diabetic chronic kidney disease: Secondary | ICD-10-CM | POA: Diagnosis not present

## 2019-07-16 DIAGNOSIS — K219 Gastro-esophageal reflux disease without esophagitis: Secondary | ICD-10-CM | POA: Diagnosis not present

## 2019-07-16 DIAGNOSIS — D631 Anemia in chronic kidney disease: Secondary | ICD-10-CM | POA: Diagnosis not present

## 2019-07-17 DIAGNOSIS — D631 Anemia in chronic kidney disease: Secondary | ICD-10-CM | POA: Diagnosis not present

## 2019-07-17 DIAGNOSIS — E1122 Type 2 diabetes mellitus with diabetic chronic kidney disease: Secondary | ICD-10-CM | POA: Diagnosis not present

## 2019-07-17 DIAGNOSIS — I10 Essential (primary) hypertension: Secondary | ICD-10-CM | POA: Diagnosis not present

## 2019-07-17 DIAGNOSIS — I4891 Unspecified atrial fibrillation: Secondary | ICD-10-CM | POA: Diagnosis not present

## 2019-07-17 DIAGNOSIS — K219 Gastro-esophageal reflux disease without esophagitis: Secondary | ICD-10-CM | POA: Diagnosis not present

## 2019-07-17 DIAGNOSIS — R2681 Unsteadiness on feet: Secondary | ICD-10-CM | POA: Diagnosis not present

## 2019-07-17 DIAGNOSIS — M069 Rheumatoid arthritis, unspecified: Secondary | ICD-10-CM | POA: Diagnosis not present

## 2019-07-17 DIAGNOSIS — E039 Hypothyroidism, unspecified: Secondary | ICD-10-CM | POA: Diagnosis not present

## 2019-07-17 DIAGNOSIS — R2689 Other abnormalities of gait and mobility: Secondary | ICD-10-CM | POA: Diagnosis not present

## 2019-07-18 DIAGNOSIS — R2689 Other abnormalities of gait and mobility: Secondary | ICD-10-CM | POA: Diagnosis not present

## 2019-07-18 DIAGNOSIS — M069 Rheumatoid arthritis, unspecified: Secondary | ICD-10-CM | POA: Diagnosis not present

## 2019-07-18 DIAGNOSIS — K219 Gastro-esophageal reflux disease without esophagitis: Secondary | ICD-10-CM | POA: Diagnosis not present

## 2019-07-18 DIAGNOSIS — R2681 Unsteadiness on feet: Secondary | ICD-10-CM | POA: Diagnosis not present

## 2019-07-18 DIAGNOSIS — E039 Hypothyroidism, unspecified: Secondary | ICD-10-CM | POA: Diagnosis not present

## 2019-07-18 DIAGNOSIS — I10 Essential (primary) hypertension: Secondary | ICD-10-CM | POA: Diagnosis not present

## 2019-07-18 DIAGNOSIS — D631 Anemia in chronic kidney disease: Secondary | ICD-10-CM | POA: Diagnosis not present

## 2019-07-18 DIAGNOSIS — E1122 Type 2 diabetes mellitus with diabetic chronic kidney disease: Secondary | ICD-10-CM | POA: Diagnosis not present

## 2019-07-18 DIAGNOSIS — I4891 Unspecified atrial fibrillation: Secondary | ICD-10-CM | POA: Diagnosis not present

## 2019-07-19 DIAGNOSIS — R2689 Other abnormalities of gait and mobility: Secondary | ICD-10-CM | POA: Diagnosis not present

## 2019-07-19 DIAGNOSIS — D631 Anemia in chronic kidney disease: Secondary | ICD-10-CM | POA: Diagnosis not present

## 2019-07-19 DIAGNOSIS — M069 Rheumatoid arthritis, unspecified: Secondary | ICD-10-CM | POA: Diagnosis not present

## 2019-07-19 DIAGNOSIS — I4891 Unspecified atrial fibrillation: Secondary | ICD-10-CM | POA: Diagnosis not present

## 2019-07-19 DIAGNOSIS — K219 Gastro-esophageal reflux disease without esophagitis: Secondary | ICD-10-CM | POA: Diagnosis not present

## 2019-07-19 DIAGNOSIS — R2681 Unsteadiness on feet: Secondary | ICD-10-CM | POA: Diagnosis not present

## 2019-07-19 DIAGNOSIS — I10 Essential (primary) hypertension: Secondary | ICD-10-CM | POA: Diagnosis not present

## 2019-07-19 DIAGNOSIS — E1122 Type 2 diabetes mellitus with diabetic chronic kidney disease: Secondary | ICD-10-CM | POA: Diagnosis not present

## 2019-07-19 DIAGNOSIS — E039 Hypothyroidism, unspecified: Secondary | ICD-10-CM | POA: Diagnosis not present

## 2019-07-20 DIAGNOSIS — R2689 Other abnormalities of gait and mobility: Secondary | ICD-10-CM | POA: Diagnosis not present

## 2019-07-20 DIAGNOSIS — E039 Hypothyroidism, unspecified: Secondary | ICD-10-CM | POA: Diagnosis not present

## 2019-07-20 DIAGNOSIS — E1122 Type 2 diabetes mellitus with diabetic chronic kidney disease: Secondary | ICD-10-CM | POA: Diagnosis not present

## 2019-07-20 DIAGNOSIS — K219 Gastro-esophageal reflux disease without esophagitis: Secondary | ICD-10-CM | POA: Diagnosis not present

## 2019-07-20 DIAGNOSIS — R2681 Unsteadiness on feet: Secondary | ICD-10-CM | POA: Diagnosis not present

## 2019-07-20 DIAGNOSIS — D631 Anemia in chronic kidney disease: Secondary | ICD-10-CM | POA: Diagnosis not present

## 2019-07-20 DIAGNOSIS — M069 Rheumatoid arthritis, unspecified: Secondary | ICD-10-CM | POA: Diagnosis not present

## 2019-07-20 DIAGNOSIS — I10 Essential (primary) hypertension: Secondary | ICD-10-CM | POA: Diagnosis not present

## 2019-07-20 DIAGNOSIS — I4891 Unspecified atrial fibrillation: Secondary | ICD-10-CM | POA: Diagnosis not present

## 2019-07-21 DIAGNOSIS — R2689 Other abnormalities of gait and mobility: Secondary | ICD-10-CM | POA: Diagnosis not present

## 2019-07-21 DIAGNOSIS — D631 Anemia in chronic kidney disease: Secondary | ICD-10-CM | POA: Diagnosis not present

## 2019-07-21 DIAGNOSIS — F0390 Unspecified dementia without behavioral disturbance: Secondary | ICD-10-CM | POA: Diagnosis not present

## 2019-07-21 DIAGNOSIS — I4891 Unspecified atrial fibrillation: Secondary | ICD-10-CM | POA: Diagnosis not present

## 2019-07-21 DIAGNOSIS — E1122 Type 2 diabetes mellitus with diabetic chronic kidney disease: Secondary | ICD-10-CM | POA: Diagnosis not present

## 2019-07-21 DIAGNOSIS — I10 Essential (primary) hypertension: Secondary | ICD-10-CM | POA: Diagnosis not present

## 2019-07-21 DIAGNOSIS — F329 Major depressive disorder, single episode, unspecified: Secondary | ICD-10-CM | POA: Diagnosis not present

## 2019-07-21 DIAGNOSIS — I5032 Chronic diastolic (congestive) heart failure: Secondary | ICD-10-CM | POA: Diagnosis not present

## 2019-07-21 DIAGNOSIS — M069 Rheumatoid arthritis, unspecified: Secondary | ICD-10-CM | POA: Diagnosis not present

## 2019-07-21 DIAGNOSIS — K219 Gastro-esophageal reflux disease without esophagitis: Secondary | ICD-10-CM | POA: Diagnosis not present

## 2019-07-21 DIAGNOSIS — F419 Anxiety disorder, unspecified: Secondary | ICD-10-CM | POA: Diagnosis not present

## 2019-07-21 DIAGNOSIS — I4821 Permanent atrial fibrillation: Secondary | ICD-10-CM | POA: Diagnosis not present

## 2019-07-21 DIAGNOSIS — R2681 Unsteadiness on feet: Secondary | ICD-10-CM | POA: Diagnosis not present

## 2019-07-21 DIAGNOSIS — E039 Hypothyroidism, unspecified: Secondary | ICD-10-CM | POA: Diagnosis not present

## 2019-07-22 DIAGNOSIS — I4891 Unspecified atrial fibrillation: Secondary | ICD-10-CM | POA: Diagnosis not present

## 2019-07-22 DIAGNOSIS — D631 Anemia in chronic kidney disease: Secondary | ICD-10-CM | POA: Diagnosis not present

## 2019-07-22 DIAGNOSIS — R2681 Unsteadiness on feet: Secondary | ICD-10-CM | POA: Diagnosis not present

## 2019-07-22 DIAGNOSIS — I10 Essential (primary) hypertension: Secondary | ICD-10-CM | POA: Diagnosis not present

## 2019-07-22 DIAGNOSIS — R2689 Other abnormalities of gait and mobility: Secondary | ICD-10-CM | POA: Diagnosis not present

## 2019-07-22 DIAGNOSIS — E039 Hypothyroidism, unspecified: Secondary | ICD-10-CM | POA: Diagnosis not present

## 2019-07-22 DIAGNOSIS — E1122 Type 2 diabetes mellitus with diabetic chronic kidney disease: Secondary | ICD-10-CM | POA: Diagnosis not present

## 2019-07-22 DIAGNOSIS — M069 Rheumatoid arthritis, unspecified: Secondary | ICD-10-CM | POA: Diagnosis not present

## 2019-07-22 DIAGNOSIS — K219 Gastro-esophageal reflux disease without esophagitis: Secondary | ICD-10-CM | POA: Diagnosis not present

## 2019-07-23 DIAGNOSIS — G4700 Insomnia, unspecified: Secondary | ICD-10-CM | POA: Diagnosis not present

## 2019-07-23 DIAGNOSIS — F0632 Mood disorder due to known physiological condition with major depressive-like episode: Secondary | ICD-10-CM | POA: Diagnosis not present

## 2019-07-23 DIAGNOSIS — F331 Major depressive disorder, recurrent, moderate: Secondary | ICD-10-CM | POA: Diagnosis not present

## 2019-07-23 DIAGNOSIS — F0151 Vascular dementia with behavioral disturbance: Secondary | ICD-10-CM | POA: Diagnosis not present

## 2019-07-25 DIAGNOSIS — D631 Anemia in chronic kidney disease: Secondary | ICD-10-CM | POA: Diagnosis not present

## 2019-07-25 DIAGNOSIS — I10 Essential (primary) hypertension: Secondary | ICD-10-CM | POA: Diagnosis not present

## 2019-07-25 DIAGNOSIS — R2681 Unsteadiness on feet: Secondary | ICD-10-CM | POA: Diagnosis not present

## 2019-07-25 DIAGNOSIS — L602 Onychogryphosis: Secondary | ICD-10-CM | POA: Diagnosis not present

## 2019-07-25 DIAGNOSIS — M2042 Other hammer toe(s) (acquired), left foot: Secondary | ICD-10-CM | POA: Diagnosis not present

## 2019-07-25 DIAGNOSIS — M2041 Other hammer toe(s) (acquired), right foot: Secondary | ICD-10-CM | POA: Diagnosis not present

## 2019-07-25 DIAGNOSIS — R2689 Other abnormalities of gait and mobility: Secondary | ICD-10-CM | POA: Diagnosis not present

## 2019-07-25 DIAGNOSIS — K219 Gastro-esophageal reflux disease without esophagitis: Secondary | ICD-10-CM | POA: Diagnosis not present

## 2019-07-25 DIAGNOSIS — E039 Hypothyroidism, unspecified: Secondary | ICD-10-CM | POA: Diagnosis not present

## 2019-07-25 DIAGNOSIS — E1142 Type 2 diabetes mellitus with diabetic polyneuropathy: Secondary | ICD-10-CM | POA: Diagnosis not present

## 2019-07-25 DIAGNOSIS — S98131A Complete traumatic amputation of one right lesser toe, initial encounter: Secondary | ICD-10-CM | POA: Diagnosis not present

## 2019-07-25 DIAGNOSIS — E1122 Type 2 diabetes mellitus with diabetic chronic kidney disease: Secondary | ICD-10-CM | POA: Diagnosis not present

## 2019-07-25 DIAGNOSIS — M069 Rheumatoid arthritis, unspecified: Secondary | ICD-10-CM | POA: Diagnosis not present

## 2019-07-25 DIAGNOSIS — I4891 Unspecified atrial fibrillation: Secondary | ICD-10-CM | POA: Diagnosis not present

## 2019-07-25 DIAGNOSIS — L859 Epidermal thickening, unspecified: Secondary | ICD-10-CM | POA: Diagnosis not present

## 2019-07-26 DIAGNOSIS — Z20828 Contact with and (suspected) exposure to other viral communicable diseases: Secondary | ICD-10-CM | POA: Diagnosis not present

## 2019-07-27 DIAGNOSIS — K219 Gastro-esophageal reflux disease without esophagitis: Secondary | ICD-10-CM | POA: Diagnosis not present

## 2019-07-27 DIAGNOSIS — M069 Rheumatoid arthritis, unspecified: Secondary | ICD-10-CM | POA: Diagnosis not present

## 2019-07-27 DIAGNOSIS — E039 Hypothyroidism, unspecified: Secondary | ICD-10-CM | POA: Diagnosis not present

## 2019-07-27 DIAGNOSIS — I10 Essential (primary) hypertension: Secondary | ICD-10-CM | POA: Diagnosis not present

## 2019-07-27 DIAGNOSIS — D631 Anemia in chronic kidney disease: Secondary | ICD-10-CM | POA: Diagnosis not present

## 2019-07-27 DIAGNOSIS — E1122 Type 2 diabetes mellitus with diabetic chronic kidney disease: Secondary | ICD-10-CM | POA: Diagnosis not present

## 2019-07-27 DIAGNOSIS — R2689 Other abnormalities of gait and mobility: Secondary | ICD-10-CM | POA: Diagnosis not present

## 2019-07-27 DIAGNOSIS — R2681 Unsteadiness on feet: Secondary | ICD-10-CM | POA: Diagnosis not present

## 2019-07-27 DIAGNOSIS — I4891 Unspecified atrial fibrillation: Secondary | ICD-10-CM | POA: Diagnosis not present

## 2019-07-30 DIAGNOSIS — R2681 Unsteadiness on feet: Secondary | ICD-10-CM | POA: Diagnosis not present

## 2019-07-30 DIAGNOSIS — I4891 Unspecified atrial fibrillation: Secondary | ICD-10-CM | POA: Diagnosis not present

## 2019-07-30 DIAGNOSIS — R2689 Other abnormalities of gait and mobility: Secondary | ICD-10-CM | POA: Diagnosis not present

## 2019-07-30 DIAGNOSIS — E1122 Type 2 diabetes mellitus with diabetic chronic kidney disease: Secondary | ICD-10-CM | POA: Diagnosis not present

## 2019-07-30 DIAGNOSIS — E039 Hypothyroidism, unspecified: Secondary | ICD-10-CM | POA: Diagnosis not present

## 2019-07-30 DIAGNOSIS — D631 Anemia in chronic kidney disease: Secondary | ICD-10-CM | POA: Diagnosis not present

## 2019-07-30 DIAGNOSIS — M069 Rheumatoid arthritis, unspecified: Secondary | ICD-10-CM | POA: Diagnosis not present

## 2019-07-30 DIAGNOSIS — K219 Gastro-esophageal reflux disease without esophagitis: Secondary | ICD-10-CM | POA: Diagnosis not present

## 2019-07-30 DIAGNOSIS — I10 Essential (primary) hypertension: Secondary | ICD-10-CM | POA: Diagnosis not present

## 2019-07-31 DIAGNOSIS — I10 Essential (primary) hypertension: Secondary | ICD-10-CM | POA: Diagnosis not present

## 2019-07-31 DIAGNOSIS — Z20828 Contact with and (suspected) exposure to other viral communicable diseases: Secondary | ICD-10-CM | POA: Diagnosis not present

## 2019-07-31 DIAGNOSIS — G301 Alzheimer's disease with late onset: Secondary | ICD-10-CM | POA: Diagnosis not present

## 2019-07-31 DIAGNOSIS — E785 Hyperlipidemia, unspecified: Secondary | ICD-10-CM | POA: Diagnosis not present

## 2019-07-31 DIAGNOSIS — M069 Rheumatoid arthritis, unspecified: Secondary | ICD-10-CM | POA: Diagnosis not present

## 2019-07-31 DIAGNOSIS — D631 Anemia in chronic kidney disease: Secondary | ICD-10-CM | POA: Diagnosis not present

## 2019-07-31 DIAGNOSIS — E039 Hypothyroidism, unspecified: Secondary | ICD-10-CM | POA: Diagnosis not present

## 2019-07-31 DIAGNOSIS — R2681 Unsteadiness on feet: Secondary | ICD-10-CM | POA: Diagnosis not present

## 2019-07-31 DIAGNOSIS — R2689 Other abnormalities of gait and mobility: Secondary | ICD-10-CM | POA: Diagnosis not present

## 2019-07-31 DIAGNOSIS — D649 Anemia, unspecified: Secondary | ICD-10-CM | POA: Diagnosis not present

## 2019-07-31 DIAGNOSIS — K219 Gastro-esophageal reflux disease without esophagitis: Secondary | ICD-10-CM | POA: Diagnosis not present

## 2019-07-31 DIAGNOSIS — I4891 Unspecified atrial fibrillation: Secondary | ICD-10-CM | POA: Diagnosis not present

## 2019-07-31 DIAGNOSIS — G4700 Insomnia, unspecified: Secondary | ICD-10-CM | POA: Diagnosis not present

## 2019-07-31 DIAGNOSIS — E119 Type 2 diabetes mellitus without complications: Secondary | ICD-10-CM | POA: Diagnosis not present

## 2019-07-31 DIAGNOSIS — F0632 Mood disorder due to known physiological condition with major depressive-like episode: Secondary | ICD-10-CM | POA: Diagnosis not present

## 2019-07-31 DIAGNOSIS — F0151 Vascular dementia with behavioral disturbance: Secondary | ICD-10-CM | POA: Diagnosis not present

## 2019-07-31 DIAGNOSIS — R41841 Cognitive communication deficit: Secondary | ICD-10-CM | POA: Diagnosis not present

## 2019-07-31 DIAGNOSIS — E1122 Type 2 diabetes mellitus with diabetic chronic kidney disease: Secondary | ICD-10-CM | POA: Diagnosis not present

## 2019-08-01 DIAGNOSIS — I4891 Unspecified atrial fibrillation: Secondary | ICD-10-CM | POA: Diagnosis not present

## 2019-08-01 DIAGNOSIS — M069 Rheumatoid arthritis, unspecified: Secondary | ICD-10-CM | POA: Diagnosis not present

## 2019-08-01 DIAGNOSIS — R2681 Unsteadiness on feet: Secondary | ICD-10-CM | POA: Diagnosis not present

## 2019-08-01 DIAGNOSIS — E1122 Type 2 diabetes mellitus with diabetic chronic kidney disease: Secondary | ICD-10-CM | POA: Diagnosis not present

## 2019-08-01 DIAGNOSIS — I10 Essential (primary) hypertension: Secondary | ICD-10-CM | POA: Diagnosis not present

## 2019-08-01 DIAGNOSIS — D631 Anemia in chronic kidney disease: Secondary | ICD-10-CM | POA: Diagnosis not present

## 2019-08-01 DIAGNOSIS — R2689 Other abnormalities of gait and mobility: Secondary | ICD-10-CM | POA: Diagnosis not present

## 2019-08-01 DIAGNOSIS — K219 Gastro-esophageal reflux disease without esophagitis: Secondary | ICD-10-CM | POA: Diagnosis not present

## 2019-08-01 DIAGNOSIS — E039 Hypothyroidism, unspecified: Secondary | ICD-10-CM | POA: Diagnosis not present

## 2019-08-03 DIAGNOSIS — E039 Hypothyroidism, unspecified: Secondary | ICD-10-CM | POA: Diagnosis not present

## 2019-08-03 DIAGNOSIS — I10 Essential (primary) hypertension: Secondary | ICD-10-CM | POA: Diagnosis not present

## 2019-08-03 DIAGNOSIS — E1122 Type 2 diabetes mellitus with diabetic chronic kidney disease: Secondary | ICD-10-CM | POA: Diagnosis not present

## 2019-08-03 DIAGNOSIS — R2689 Other abnormalities of gait and mobility: Secondary | ICD-10-CM | POA: Diagnosis not present

## 2019-08-03 DIAGNOSIS — I4891 Unspecified atrial fibrillation: Secondary | ICD-10-CM | POA: Diagnosis not present

## 2019-08-03 DIAGNOSIS — M069 Rheumatoid arthritis, unspecified: Secondary | ICD-10-CM | POA: Diagnosis not present

## 2019-08-03 DIAGNOSIS — D631 Anemia in chronic kidney disease: Secondary | ICD-10-CM | POA: Diagnosis not present

## 2019-08-03 DIAGNOSIS — R2681 Unsteadiness on feet: Secondary | ICD-10-CM | POA: Diagnosis not present

## 2019-08-03 DIAGNOSIS — K219 Gastro-esophageal reflux disease without esophagitis: Secondary | ICD-10-CM | POA: Diagnosis not present

## 2019-08-04 DIAGNOSIS — E039 Hypothyroidism, unspecified: Secondary | ICD-10-CM | POA: Diagnosis not present

## 2019-08-04 DIAGNOSIS — K219 Gastro-esophageal reflux disease without esophagitis: Secondary | ICD-10-CM | POA: Diagnosis not present

## 2019-08-04 DIAGNOSIS — I5032 Chronic diastolic (congestive) heart failure: Secondary | ICD-10-CM | POA: Diagnosis not present

## 2019-08-04 DIAGNOSIS — I4821 Permanent atrial fibrillation: Secondary | ICD-10-CM | POA: Diagnosis not present

## 2019-08-04 DIAGNOSIS — F329 Major depressive disorder, single episode, unspecified: Secondary | ICD-10-CM | POA: Diagnosis not present

## 2019-08-04 DIAGNOSIS — D631 Anemia in chronic kidney disease: Secondary | ICD-10-CM | POA: Diagnosis not present

## 2019-08-04 DIAGNOSIS — E1122 Type 2 diabetes mellitus with diabetic chronic kidney disease: Secondary | ICD-10-CM | POA: Diagnosis not present

## 2019-08-04 DIAGNOSIS — F419 Anxiety disorder, unspecified: Secondary | ICD-10-CM | POA: Diagnosis not present

## 2019-08-04 DIAGNOSIS — M069 Rheumatoid arthritis, unspecified: Secondary | ICD-10-CM | POA: Diagnosis not present

## 2019-08-04 DIAGNOSIS — I10 Essential (primary) hypertension: Secondary | ICD-10-CM | POA: Diagnosis not present

## 2019-08-04 DIAGNOSIS — I4891 Unspecified atrial fibrillation: Secondary | ICD-10-CM | POA: Diagnosis not present

## 2019-08-04 DIAGNOSIS — R2689 Other abnormalities of gait and mobility: Secondary | ICD-10-CM | POA: Diagnosis not present

## 2019-08-04 DIAGNOSIS — F0390 Unspecified dementia without behavioral disturbance: Secondary | ICD-10-CM | POA: Diagnosis not present

## 2019-08-04 DIAGNOSIS — R2681 Unsteadiness on feet: Secondary | ICD-10-CM | POA: Diagnosis not present

## 2019-08-05 DIAGNOSIS — F419 Anxiety disorder, unspecified: Secondary | ICD-10-CM | POA: Diagnosis not present

## 2019-08-05 DIAGNOSIS — F0390 Unspecified dementia without behavioral disturbance: Secondary | ICD-10-CM | POA: Diagnosis not present

## 2019-08-05 DIAGNOSIS — W19XXXD Unspecified fall, subsequent encounter: Secondary | ICD-10-CM | POA: Diagnosis not present

## 2019-08-05 DIAGNOSIS — I4821 Permanent atrial fibrillation: Secondary | ICD-10-CM | POA: Diagnosis not present

## 2019-08-06 DIAGNOSIS — E1122 Type 2 diabetes mellitus with diabetic chronic kidney disease: Secondary | ICD-10-CM | POA: Diagnosis not present

## 2019-08-06 DIAGNOSIS — M069 Rheumatoid arthritis, unspecified: Secondary | ICD-10-CM | POA: Diagnosis not present

## 2019-08-06 DIAGNOSIS — K219 Gastro-esophageal reflux disease without esophagitis: Secondary | ICD-10-CM | POA: Diagnosis not present

## 2019-08-06 DIAGNOSIS — R2689 Other abnormalities of gait and mobility: Secondary | ICD-10-CM | POA: Diagnosis not present

## 2019-08-06 DIAGNOSIS — D631 Anemia in chronic kidney disease: Secondary | ICD-10-CM | POA: Diagnosis not present

## 2019-08-06 DIAGNOSIS — Z20828 Contact with and (suspected) exposure to other viral communicable diseases: Secondary | ICD-10-CM | POA: Diagnosis not present

## 2019-08-06 DIAGNOSIS — E039 Hypothyroidism, unspecified: Secondary | ICD-10-CM | POA: Diagnosis not present

## 2019-08-06 DIAGNOSIS — R2681 Unsteadiness on feet: Secondary | ICD-10-CM | POA: Diagnosis not present

## 2019-08-06 DIAGNOSIS — I4891 Unspecified atrial fibrillation: Secondary | ICD-10-CM | POA: Diagnosis not present

## 2019-08-06 DIAGNOSIS — I10 Essential (primary) hypertension: Secondary | ICD-10-CM | POA: Diagnosis not present

## 2019-08-07 DIAGNOSIS — M069 Rheumatoid arthritis, unspecified: Secondary | ICD-10-CM | POA: Diagnosis not present

## 2019-08-07 DIAGNOSIS — I4891 Unspecified atrial fibrillation: Secondary | ICD-10-CM | POA: Diagnosis not present

## 2019-08-07 DIAGNOSIS — D631 Anemia in chronic kidney disease: Secondary | ICD-10-CM | POA: Diagnosis not present

## 2019-08-07 DIAGNOSIS — E1122 Type 2 diabetes mellitus with diabetic chronic kidney disease: Secondary | ICD-10-CM | POA: Diagnosis not present

## 2019-08-07 DIAGNOSIS — R2689 Other abnormalities of gait and mobility: Secondary | ICD-10-CM | POA: Diagnosis not present

## 2019-08-07 DIAGNOSIS — E039 Hypothyroidism, unspecified: Secondary | ICD-10-CM | POA: Diagnosis not present

## 2019-08-07 DIAGNOSIS — R2681 Unsteadiness on feet: Secondary | ICD-10-CM | POA: Diagnosis not present

## 2019-08-07 DIAGNOSIS — K219 Gastro-esophageal reflux disease without esophagitis: Secondary | ICD-10-CM | POA: Diagnosis not present

## 2019-08-07 DIAGNOSIS — I10 Essential (primary) hypertension: Secondary | ICD-10-CM | POA: Diagnosis not present

## 2019-08-08 DIAGNOSIS — M069 Rheumatoid arthritis, unspecified: Secondary | ICD-10-CM | POA: Diagnosis not present

## 2019-08-08 DIAGNOSIS — D631 Anemia in chronic kidney disease: Secondary | ICD-10-CM | POA: Diagnosis not present

## 2019-08-08 DIAGNOSIS — I10 Essential (primary) hypertension: Secondary | ICD-10-CM | POA: Diagnosis not present

## 2019-08-08 DIAGNOSIS — I4891 Unspecified atrial fibrillation: Secondary | ICD-10-CM | POA: Diagnosis not present

## 2019-08-08 DIAGNOSIS — E1122 Type 2 diabetes mellitus with diabetic chronic kidney disease: Secondary | ICD-10-CM | POA: Diagnosis not present

## 2019-08-08 DIAGNOSIS — Z20828 Contact with and (suspected) exposure to other viral communicable diseases: Secondary | ICD-10-CM | POA: Diagnosis not present

## 2019-08-08 DIAGNOSIS — K219 Gastro-esophageal reflux disease without esophagitis: Secondary | ICD-10-CM | POA: Diagnosis not present

## 2019-08-08 DIAGNOSIS — R2689 Other abnormalities of gait and mobility: Secondary | ICD-10-CM | POA: Diagnosis not present

## 2019-08-08 DIAGNOSIS — E039 Hypothyroidism, unspecified: Secondary | ICD-10-CM | POA: Diagnosis not present

## 2019-08-08 DIAGNOSIS — R2681 Unsteadiness on feet: Secondary | ICD-10-CM | POA: Diagnosis not present

## 2019-08-09 DIAGNOSIS — R2681 Unsteadiness on feet: Secondary | ICD-10-CM | POA: Diagnosis not present

## 2019-08-09 DIAGNOSIS — D631 Anemia in chronic kidney disease: Secondary | ICD-10-CM | POA: Diagnosis not present

## 2019-08-09 DIAGNOSIS — M069 Rheumatoid arthritis, unspecified: Secondary | ICD-10-CM | POA: Diagnosis not present

## 2019-08-09 DIAGNOSIS — I10 Essential (primary) hypertension: Secondary | ICD-10-CM | POA: Diagnosis not present

## 2019-08-09 DIAGNOSIS — R2689 Other abnormalities of gait and mobility: Secondary | ICD-10-CM | POA: Diagnosis not present

## 2019-08-09 DIAGNOSIS — E039 Hypothyroidism, unspecified: Secondary | ICD-10-CM | POA: Diagnosis not present

## 2019-08-09 DIAGNOSIS — I4891 Unspecified atrial fibrillation: Secondary | ICD-10-CM | POA: Diagnosis not present

## 2019-08-09 DIAGNOSIS — K219 Gastro-esophageal reflux disease without esophagitis: Secondary | ICD-10-CM | POA: Diagnosis not present

## 2019-08-09 DIAGNOSIS — E1122 Type 2 diabetes mellitus with diabetic chronic kidney disease: Secondary | ICD-10-CM | POA: Diagnosis not present

## 2019-08-11 DIAGNOSIS — F0390 Unspecified dementia without behavioral disturbance: Secondary | ICD-10-CM | POA: Diagnosis not present

## 2019-08-11 DIAGNOSIS — I10 Essential (primary) hypertension: Secondary | ICD-10-CM | POA: Diagnosis not present

## 2019-08-11 DIAGNOSIS — I4821 Permanent atrial fibrillation: Secondary | ICD-10-CM | POA: Diagnosis not present

## 2019-08-11 DIAGNOSIS — F329 Major depressive disorder, single episode, unspecified: Secondary | ICD-10-CM | POA: Diagnosis not present

## 2019-08-11 DIAGNOSIS — F419 Anxiety disorder, unspecified: Secondary | ICD-10-CM | POA: Diagnosis not present

## 2019-08-12 DIAGNOSIS — Z20828 Contact with and (suspected) exposure to other viral communicable diseases: Secondary | ICD-10-CM | POA: Diagnosis not present

## 2019-08-12 DIAGNOSIS — M069 Rheumatoid arthritis, unspecified: Secondary | ICD-10-CM | POA: Diagnosis not present

## 2019-08-12 DIAGNOSIS — R2689 Other abnormalities of gait and mobility: Secondary | ICD-10-CM | POA: Diagnosis not present

## 2019-08-12 DIAGNOSIS — K219 Gastro-esophageal reflux disease without esophagitis: Secondary | ICD-10-CM | POA: Diagnosis not present

## 2019-08-12 DIAGNOSIS — E1122 Type 2 diabetes mellitus with diabetic chronic kidney disease: Secondary | ICD-10-CM | POA: Diagnosis not present

## 2019-08-12 DIAGNOSIS — I4891 Unspecified atrial fibrillation: Secondary | ICD-10-CM | POA: Diagnosis not present

## 2019-08-12 DIAGNOSIS — E039 Hypothyroidism, unspecified: Secondary | ICD-10-CM | POA: Diagnosis not present

## 2019-08-12 DIAGNOSIS — I10 Essential (primary) hypertension: Secondary | ICD-10-CM | POA: Diagnosis not present

## 2019-08-12 DIAGNOSIS — R2681 Unsteadiness on feet: Secondary | ICD-10-CM | POA: Diagnosis not present

## 2019-08-12 DIAGNOSIS — D631 Anemia in chronic kidney disease: Secondary | ICD-10-CM | POA: Diagnosis not present

## 2019-08-13 DIAGNOSIS — M069 Rheumatoid arthritis, unspecified: Secondary | ICD-10-CM | POA: Diagnosis not present

## 2019-08-13 DIAGNOSIS — I4891 Unspecified atrial fibrillation: Secondary | ICD-10-CM | POA: Diagnosis not present

## 2019-08-13 DIAGNOSIS — E119 Type 2 diabetes mellitus without complications: Secondary | ICD-10-CM | POA: Diagnosis not present

## 2019-08-13 DIAGNOSIS — R451 Restlessness and agitation: Secondary | ICD-10-CM | POA: Diagnosis not present

## 2019-08-13 DIAGNOSIS — F0390 Unspecified dementia without behavioral disturbance: Secondary | ICD-10-CM | POA: Diagnosis not present

## 2019-08-13 DIAGNOSIS — F329 Major depressive disorder, single episode, unspecified: Secondary | ICD-10-CM | POA: Diagnosis not present

## 2019-08-13 DIAGNOSIS — R2689 Other abnormalities of gait and mobility: Secondary | ICD-10-CM | POA: Diagnosis not present

## 2019-08-13 DIAGNOSIS — D631 Anemia in chronic kidney disease: Secondary | ICD-10-CM | POA: Diagnosis not present

## 2019-08-13 DIAGNOSIS — F419 Anxiety disorder, unspecified: Secondary | ICD-10-CM | POA: Diagnosis not present

## 2019-08-13 DIAGNOSIS — E1122 Type 2 diabetes mellitus with diabetic chronic kidney disease: Secondary | ICD-10-CM | POA: Diagnosis not present

## 2019-08-13 DIAGNOSIS — E039 Hypothyroidism, unspecified: Secondary | ICD-10-CM | POA: Diagnosis not present

## 2019-08-13 DIAGNOSIS — D6489 Other specified anemias: Secondary | ICD-10-CM | POA: Diagnosis not present

## 2019-08-13 DIAGNOSIS — E7849 Other hyperlipidemia: Secondary | ICD-10-CM | POA: Diagnosis not present

## 2019-08-13 DIAGNOSIS — K219 Gastro-esophageal reflux disease without esophagitis: Secondary | ICD-10-CM | POA: Diagnosis not present

## 2019-08-13 DIAGNOSIS — I10 Essential (primary) hypertension: Secondary | ICD-10-CM | POA: Diagnosis not present

## 2019-08-13 DIAGNOSIS — R2681 Unsteadiness on feet: Secondary | ICD-10-CM | POA: Diagnosis not present

## 2019-08-14 DIAGNOSIS — E039 Hypothyroidism, unspecified: Secondary | ICD-10-CM | POA: Diagnosis not present

## 2019-08-14 DIAGNOSIS — K219 Gastro-esophageal reflux disease without esophagitis: Secondary | ICD-10-CM | POA: Diagnosis not present

## 2019-08-14 DIAGNOSIS — M069 Rheumatoid arthritis, unspecified: Secondary | ICD-10-CM | POA: Diagnosis not present

## 2019-08-14 DIAGNOSIS — I4891 Unspecified atrial fibrillation: Secondary | ICD-10-CM | POA: Diagnosis not present

## 2019-08-14 DIAGNOSIS — I10 Essential (primary) hypertension: Secondary | ICD-10-CM | POA: Diagnosis not present

## 2019-08-14 DIAGNOSIS — D631 Anemia in chronic kidney disease: Secondary | ICD-10-CM | POA: Diagnosis not present

## 2019-08-14 DIAGNOSIS — R2689 Other abnormalities of gait and mobility: Secondary | ICD-10-CM | POA: Diagnosis not present

## 2019-08-14 DIAGNOSIS — R2681 Unsteadiness on feet: Secondary | ICD-10-CM | POA: Diagnosis not present

## 2019-08-14 DIAGNOSIS — E1122 Type 2 diabetes mellitus with diabetic chronic kidney disease: Secondary | ICD-10-CM | POA: Diagnosis not present

## 2019-08-15 DIAGNOSIS — D631 Anemia in chronic kidney disease: Secondary | ICD-10-CM | POA: Diagnosis not present

## 2019-08-15 DIAGNOSIS — E1122 Type 2 diabetes mellitus with diabetic chronic kidney disease: Secondary | ICD-10-CM | POA: Diagnosis not present

## 2019-08-15 DIAGNOSIS — E039 Hypothyroidism, unspecified: Secondary | ICD-10-CM | POA: Diagnosis not present

## 2019-08-15 DIAGNOSIS — M069 Rheumatoid arthritis, unspecified: Secondary | ICD-10-CM | POA: Diagnosis not present

## 2019-08-15 DIAGNOSIS — K219 Gastro-esophageal reflux disease without esophagitis: Secondary | ICD-10-CM | POA: Diagnosis not present

## 2019-08-15 DIAGNOSIS — R2681 Unsteadiness on feet: Secondary | ICD-10-CM | POA: Diagnosis not present

## 2019-08-15 DIAGNOSIS — I10 Essential (primary) hypertension: Secondary | ICD-10-CM | POA: Diagnosis not present

## 2019-08-15 DIAGNOSIS — I4891 Unspecified atrial fibrillation: Secondary | ICD-10-CM | POA: Diagnosis not present

## 2019-08-15 DIAGNOSIS — R2689 Other abnormalities of gait and mobility: Secondary | ICD-10-CM | POA: Diagnosis not present

## 2019-08-16 DIAGNOSIS — E039 Hypothyroidism, unspecified: Secondary | ICD-10-CM | POA: Diagnosis not present

## 2019-08-16 DIAGNOSIS — D631 Anemia in chronic kidney disease: Secondary | ICD-10-CM | POA: Diagnosis not present

## 2019-08-16 DIAGNOSIS — I4891 Unspecified atrial fibrillation: Secondary | ICD-10-CM | POA: Diagnosis not present

## 2019-08-16 DIAGNOSIS — K219 Gastro-esophageal reflux disease without esophagitis: Secondary | ICD-10-CM | POA: Diagnosis not present

## 2019-08-16 DIAGNOSIS — R2689 Other abnormalities of gait and mobility: Secondary | ICD-10-CM | POA: Diagnosis not present

## 2019-08-16 DIAGNOSIS — E1122 Type 2 diabetes mellitus with diabetic chronic kidney disease: Secondary | ICD-10-CM | POA: Diagnosis not present

## 2019-08-16 DIAGNOSIS — M069 Rheumatoid arthritis, unspecified: Secondary | ICD-10-CM | POA: Diagnosis not present

## 2019-08-16 DIAGNOSIS — R2681 Unsteadiness on feet: Secondary | ICD-10-CM | POA: Diagnosis not present

## 2019-08-16 DIAGNOSIS — I10 Essential (primary) hypertension: Secondary | ICD-10-CM | POA: Diagnosis not present

## 2019-08-19 DIAGNOSIS — K219 Gastro-esophageal reflux disease without esophagitis: Secondary | ICD-10-CM | POA: Diagnosis not present

## 2019-08-19 DIAGNOSIS — M069 Rheumatoid arthritis, unspecified: Secondary | ICD-10-CM | POA: Diagnosis not present

## 2019-08-19 DIAGNOSIS — D631 Anemia in chronic kidney disease: Secondary | ICD-10-CM | POA: Diagnosis not present

## 2019-08-19 DIAGNOSIS — R2689 Other abnormalities of gait and mobility: Secondary | ICD-10-CM | POA: Diagnosis not present

## 2019-08-19 DIAGNOSIS — I10 Essential (primary) hypertension: Secondary | ICD-10-CM | POA: Diagnosis not present

## 2019-08-19 DIAGNOSIS — E1122 Type 2 diabetes mellitus with diabetic chronic kidney disease: Secondary | ICD-10-CM | POA: Diagnosis not present

## 2019-08-19 DIAGNOSIS — I4891 Unspecified atrial fibrillation: Secondary | ICD-10-CM | POA: Diagnosis not present

## 2019-08-19 DIAGNOSIS — R2681 Unsteadiness on feet: Secondary | ICD-10-CM | POA: Diagnosis not present

## 2019-08-19 DIAGNOSIS — E039 Hypothyroidism, unspecified: Secondary | ICD-10-CM | POA: Diagnosis not present

## 2019-08-20 DIAGNOSIS — Z20828 Contact with and (suspected) exposure to other viral communicable diseases: Secondary | ICD-10-CM | POA: Diagnosis not present

## 2019-08-20 DIAGNOSIS — I4891 Unspecified atrial fibrillation: Secondary | ICD-10-CM | POA: Diagnosis not present

## 2019-08-20 DIAGNOSIS — R2689 Other abnormalities of gait and mobility: Secondary | ICD-10-CM | POA: Diagnosis not present

## 2019-08-20 DIAGNOSIS — I10 Essential (primary) hypertension: Secondary | ICD-10-CM | POA: Diagnosis not present

## 2019-08-20 DIAGNOSIS — F0632 Mood disorder due to known physiological condition with major depressive-like episode: Secondary | ICD-10-CM | POA: Diagnosis not present

## 2019-08-20 DIAGNOSIS — M069 Rheumatoid arthritis, unspecified: Secondary | ICD-10-CM | POA: Diagnosis not present

## 2019-08-20 DIAGNOSIS — R2681 Unsteadiness on feet: Secondary | ICD-10-CM | POA: Diagnosis not present

## 2019-08-20 DIAGNOSIS — D631 Anemia in chronic kidney disease: Secondary | ICD-10-CM | POA: Diagnosis not present

## 2019-08-20 DIAGNOSIS — E039 Hypothyroidism, unspecified: Secondary | ICD-10-CM | POA: Diagnosis not present

## 2019-08-20 DIAGNOSIS — G301 Alzheimer's disease with late onset: Secondary | ICD-10-CM | POA: Diagnosis not present

## 2019-08-20 DIAGNOSIS — K219 Gastro-esophageal reflux disease without esophagitis: Secondary | ICD-10-CM | POA: Diagnosis not present

## 2019-08-20 DIAGNOSIS — F331 Major depressive disorder, recurrent, moderate: Secondary | ICD-10-CM | POA: Diagnosis not present

## 2019-08-20 DIAGNOSIS — G4700 Insomnia, unspecified: Secondary | ICD-10-CM | POA: Diagnosis not present

## 2019-08-20 DIAGNOSIS — F0151 Vascular dementia with behavioral disturbance: Secondary | ICD-10-CM | POA: Diagnosis not present

## 2019-08-20 DIAGNOSIS — E1122 Type 2 diabetes mellitus with diabetic chronic kidney disease: Secondary | ICD-10-CM | POA: Diagnosis not present

## 2019-08-21 DIAGNOSIS — I4891 Unspecified atrial fibrillation: Secondary | ICD-10-CM | POA: Diagnosis not present

## 2019-08-21 DIAGNOSIS — E1122 Type 2 diabetes mellitus with diabetic chronic kidney disease: Secondary | ICD-10-CM | POA: Diagnosis not present

## 2019-08-21 DIAGNOSIS — I10 Essential (primary) hypertension: Secondary | ICD-10-CM | POA: Diagnosis not present

## 2019-08-21 DIAGNOSIS — R2681 Unsteadiness on feet: Secondary | ICD-10-CM | POA: Diagnosis not present

## 2019-08-21 DIAGNOSIS — R2689 Other abnormalities of gait and mobility: Secondary | ICD-10-CM | POA: Diagnosis not present

## 2019-08-21 DIAGNOSIS — M069 Rheumatoid arthritis, unspecified: Secondary | ICD-10-CM | POA: Diagnosis not present

## 2019-08-21 DIAGNOSIS — K219 Gastro-esophageal reflux disease without esophagitis: Secondary | ICD-10-CM | POA: Diagnosis not present

## 2019-08-21 DIAGNOSIS — D631 Anemia in chronic kidney disease: Secondary | ICD-10-CM | POA: Diagnosis not present

## 2019-08-21 DIAGNOSIS — E039 Hypothyroidism, unspecified: Secondary | ICD-10-CM | POA: Diagnosis not present

## 2019-08-22 DIAGNOSIS — I4891 Unspecified atrial fibrillation: Secondary | ICD-10-CM | POA: Diagnosis not present

## 2019-08-22 DIAGNOSIS — E039 Hypothyroidism, unspecified: Secondary | ICD-10-CM | POA: Diagnosis not present

## 2019-08-22 DIAGNOSIS — I10 Essential (primary) hypertension: Secondary | ICD-10-CM | POA: Diagnosis not present

## 2019-08-22 DIAGNOSIS — M069 Rheumatoid arthritis, unspecified: Secondary | ICD-10-CM | POA: Diagnosis not present

## 2019-08-22 DIAGNOSIS — D631 Anemia in chronic kidney disease: Secondary | ICD-10-CM | POA: Diagnosis not present

## 2019-08-22 DIAGNOSIS — R2689 Other abnormalities of gait and mobility: Secondary | ICD-10-CM | POA: Diagnosis not present

## 2019-08-22 DIAGNOSIS — E1122 Type 2 diabetes mellitus with diabetic chronic kidney disease: Secondary | ICD-10-CM | POA: Diagnosis not present

## 2019-08-22 DIAGNOSIS — K219 Gastro-esophageal reflux disease without esophagitis: Secondary | ICD-10-CM | POA: Diagnosis not present

## 2019-08-22 DIAGNOSIS — R2681 Unsteadiness on feet: Secondary | ICD-10-CM | POA: Diagnosis not present

## 2019-08-23 DIAGNOSIS — M069 Rheumatoid arthritis, unspecified: Secondary | ICD-10-CM | POA: Diagnosis not present

## 2019-08-23 DIAGNOSIS — R2681 Unsteadiness on feet: Secondary | ICD-10-CM | POA: Diagnosis not present

## 2019-08-23 DIAGNOSIS — I10 Essential (primary) hypertension: Secondary | ICD-10-CM | POA: Diagnosis not present

## 2019-08-23 DIAGNOSIS — E039 Hypothyroidism, unspecified: Secondary | ICD-10-CM | POA: Diagnosis not present

## 2019-08-23 DIAGNOSIS — E1122 Type 2 diabetes mellitus with diabetic chronic kidney disease: Secondary | ICD-10-CM | POA: Diagnosis not present

## 2019-08-23 DIAGNOSIS — I4891 Unspecified atrial fibrillation: Secondary | ICD-10-CM | POA: Diagnosis not present

## 2019-08-23 DIAGNOSIS — R2689 Other abnormalities of gait and mobility: Secondary | ICD-10-CM | POA: Diagnosis not present

## 2019-08-23 DIAGNOSIS — D631 Anemia in chronic kidney disease: Secondary | ICD-10-CM | POA: Diagnosis not present

## 2019-08-23 DIAGNOSIS — K219 Gastro-esophageal reflux disease without esophagitis: Secondary | ICD-10-CM | POA: Diagnosis not present

## 2019-08-26 DIAGNOSIS — D631 Anemia in chronic kidney disease: Secondary | ICD-10-CM | POA: Diagnosis not present

## 2019-08-26 DIAGNOSIS — I4891 Unspecified atrial fibrillation: Secondary | ICD-10-CM | POA: Diagnosis not present

## 2019-08-26 DIAGNOSIS — K219 Gastro-esophageal reflux disease without esophagitis: Secondary | ICD-10-CM | POA: Diagnosis not present

## 2019-08-26 DIAGNOSIS — I10 Essential (primary) hypertension: Secondary | ICD-10-CM | POA: Diagnosis not present

## 2019-08-26 DIAGNOSIS — E1122 Type 2 diabetes mellitus with diabetic chronic kidney disease: Secondary | ICD-10-CM | POA: Diagnosis not present

## 2019-08-26 DIAGNOSIS — R2689 Other abnormalities of gait and mobility: Secondary | ICD-10-CM | POA: Diagnosis not present

## 2019-08-26 DIAGNOSIS — R2681 Unsteadiness on feet: Secondary | ICD-10-CM | POA: Diagnosis not present

## 2019-08-26 DIAGNOSIS — M069 Rheumatoid arthritis, unspecified: Secondary | ICD-10-CM | POA: Diagnosis not present

## 2019-08-26 DIAGNOSIS — E039 Hypothyroidism, unspecified: Secondary | ICD-10-CM | POA: Diagnosis not present

## 2019-08-27 DIAGNOSIS — D631 Anemia in chronic kidney disease: Secondary | ICD-10-CM | POA: Diagnosis not present

## 2019-08-27 DIAGNOSIS — M069 Rheumatoid arthritis, unspecified: Secondary | ICD-10-CM | POA: Diagnosis not present

## 2019-08-27 DIAGNOSIS — E039 Hypothyroidism, unspecified: Secondary | ICD-10-CM | POA: Diagnosis not present

## 2019-08-27 DIAGNOSIS — R2681 Unsteadiness on feet: Secondary | ICD-10-CM | POA: Diagnosis not present

## 2019-08-27 DIAGNOSIS — I10 Essential (primary) hypertension: Secondary | ICD-10-CM | POA: Diagnosis not present

## 2019-08-27 DIAGNOSIS — E1122 Type 2 diabetes mellitus with diabetic chronic kidney disease: Secondary | ICD-10-CM | POA: Diagnosis not present

## 2019-08-27 DIAGNOSIS — R2689 Other abnormalities of gait and mobility: Secondary | ICD-10-CM | POA: Diagnosis not present

## 2019-08-27 DIAGNOSIS — K219 Gastro-esophageal reflux disease without esophagitis: Secondary | ICD-10-CM | POA: Diagnosis not present

## 2019-08-27 DIAGNOSIS — I4891 Unspecified atrial fibrillation: Secondary | ICD-10-CM | POA: Diagnosis not present

## 2019-08-28 DIAGNOSIS — D631 Anemia in chronic kidney disease: Secondary | ICD-10-CM | POA: Diagnosis not present

## 2019-08-28 DIAGNOSIS — R2689 Other abnormalities of gait and mobility: Secondary | ICD-10-CM | POA: Diagnosis not present

## 2019-08-28 DIAGNOSIS — I10 Essential (primary) hypertension: Secondary | ICD-10-CM | POA: Diagnosis not present

## 2019-08-28 DIAGNOSIS — E1122 Type 2 diabetes mellitus with diabetic chronic kidney disease: Secondary | ICD-10-CM | POA: Diagnosis not present

## 2019-08-28 DIAGNOSIS — I4891 Unspecified atrial fibrillation: Secondary | ICD-10-CM | POA: Diagnosis not present

## 2019-08-28 DIAGNOSIS — E039 Hypothyroidism, unspecified: Secondary | ICD-10-CM | POA: Diagnosis not present

## 2019-08-28 DIAGNOSIS — M069 Rheumatoid arthritis, unspecified: Secondary | ICD-10-CM | POA: Diagnosis not present

## 2019-08-28 DIAGNOSIS — N183 Chronic kidney disease, stage 3 unspecified: Secondary | ICD-10-CM | POA: Diagnosis not present

## 2019-08-28 DIAGNOSIS — R2681 Unsteadiness on feet: Secondary | ICD-10-CM | POA: Diagnosis not present

## 2019-08-28 DIAGNOSIS — K219 Gastro-esophageal reflux disease without esophagitis: Secondary | ICD-10-CM | POA: Diagnosis not present

## 2019-08-29 DIAGNOSIS — K219 Gastro-esophageal reflux disease without esophagitis: Secondary | ICD-10-CM | POA: Diagnosis not present

## 2019-08-29 DIAGNOSIS — R2681 Unsteadiness on feet: Secondary | ICD-10-CM | POA: Diagnosis not present

## 2019-08-29 DIAGNOSIS — M069 Rheumatoid arthritis, unspecified: Secondary | ICD-10-CM | POA: Diagnosis not present

## 2019-08-29 DIAGNOSIS — I4891 Unspecified atrial fibrillation: Secondary | ICD-10-CM | POA: Diagnosis not present

## 2019-08-29 DIAGNOSIS — R2689 Other abnormalities of gait and mobility: Secondary | ICD-10-CM | POA: Diagnosis not present

## 2019-08-29 DIAGNOSIS — I10 Essential (primary) hypertension: Secondary | ICD-10-CM | POA: Diagnosis not present

## 2019-08-29 DIAGNOSIS — D631 Anemia in chronic kidney disease: Secondary | ICD-10-CM | POA: Diagnosis not present

## 2019-08-29 DIAGNOSIS — E1122 Type 2 diabetes mellitus with diabetic chronic kidney disease: Secondary | ICD-10-CM | POA: Diagnosis not present

## 2019-08-29 DIAGNOSIS — E039 Hypothyroidism, unspecified: Secondary | ICD-10-CM | POA: Diagnosis not present

## 2019-08-30 DIAGNOSIS — E1122 Type 2 diabetes mellitus with diabetic chronic kidney disease: Secondary | ICD-10-CM | POA: Diagnosis not present

## 2019-08-30 DIAGNOSIS — I4891 Unspecified atrial fibrillation: Secondary | ICD-10-CM | POA: Diagnosis not present

## 2019-08-30 DIAGNOSIS — E039 Hypothyroidism, unspecified: Secondary | ICD-10-CM | POA: Diagnosis not present

## 2019-08-30 DIAGNOSIS — R2681 Unsteadiness on feet: Secondary | ICD-10-CM | POA: Diagnosis not present

## 2019-08-30 DIAGNOSIS — R2689 Other abnormalities of gait and mobility: Secondary | ICD-10-CM | POA: Diagnosis not present

## 2019-08-30 DIAGNOSIS — M069 Rheumatoid arthritis, unspecified: Secondary | ICD-10-CM | POA: Diagnosis not present

## 2019-08-30 DIAGNOSIS — I10 Essential (primary) hypertension: Secondary | ICD-10-CM | POA: Diagnosis not present

## 2019-08-30 DIAGNOSIS — D631 Anemia in chronic kidney disease: Secondary | ICD-10-CM | POA: Diagnosis not present

## 2019-08-30 DIAGNOSIS — K219 Gastro-esophageal reflux disease without esophagitis: Secondary | ICD-10-CM | POA: Diagnosis not present

## 2019-09-02 DIAGNOSIS — M069 Rheumatoid arthritis, unspecified: Secondary | ICD-10-CM | POA: Diagnosis not present

## 2019-09-02 DIAGNOSIS — I10 Essential (primary) hypertension: Secondary | ICD-10-CM | POA: Diagnosis not present

## 2019-09-02 DIAGNOSIS — F419 Anxiety disorder, unspecified: Secondary | ICD-10-CM | POA: Diagnosis not present

## 2019-09-02 DIAGNOSIS — F329 Major depressive disorder, single episode, unspecified: Secondary | ICD-10-CM | POA: Diagnosis not present

## 2019-09-02 DIAGNOSIS — E039 Hypothyroidism, unspecified: Secondary | ICD-10-CM | POA: Diagnosis not present

## 2019-09-02 DIAGNOSIS — D6489 Other specified anemias: Secondary | ICD-10-CM | POA: Diagnosis not present

## 2019-09-02 DIAGNOSIS — K219 Gastro-esophageal reflux disease without esophagitis: Secondary | ICD-10-CM | POA: Diagnosis not present

## 2019-09-02 DIAGNOSIS — E7849 Other hyperlipidemia: Secondary | ICD-10-CM | POA: Diagnosis not present

## 2019-09-02 DIAGNOSIS — B001 Herpesviral vesicular dermatitis: Secondary | ICD-10-CM | POA: Diagnosis not present

## 2019-09-02 DIAGNOSIS — E119 Type 2 diabetes mellitus without complications: Secondary | ICD-10-CM | POA: Diagnosis not present

## 2019-09-02 DIAGNOSIS — R2681 Unsteadiness on feet: Secondary | ICD-10-CM | POA: Diagnosis not present

## 2019-09-02 DIAGNOSIS — D631 Anemia in chronic kidney disease: Secondary | ICD-10-CM | POA: Diagnosis not present

## 2019-09-02 DIAGNOSIS — R2689 Other abnormalities of gait and mobility: Secondary | ICD-10-CM | POA: Diagnosis not present

## 2019-09-02 DIAGNOSIS — E1122 Type 2 diabetes mellitus with diabetic chronic kidney disease: Secondary | ICD-10-CM | POA: Diagnosis not present

## 2019-09-02 DIAGNOSIS — I4891 Unspecified atrial fibrillation: Secondary | ICD-10-CM | POA: Diagnosis not present

## 2019-09-03 DIAGNOSIS — K219 Gastro-esophageal reflux disease without esophagitis: Secondary | ICD-10-CM | POA: Diagnosis not present

## 2019-09-03 DIAGNOSIS — E1122 Type 2 diabetes mellitus with diabetic chronic kidney disease: Secondary | ICD-10-CM | POA: Diagnosis not present

## 2019-09-03 DIAGNOSIS — I10 Essential (primary) hypertension: Secondary | ICD-10-CM | POA: Diagnosis not present

## 2019-09-03 DIAGNOSIS — I4891 Unspecified atrial fibrillation: Secondary | ICD-10-CM | POA: Diagnosis not present

## 2019-09-03 DIAGNOSIS — D631 Anemia in chronic kidney disease: Secondary | ICD-10-CM | POA: Diagnosis not present

## 2019-09-03 DIAGNOSIS — R2681 Unsteadiness on feet: Secondary | ICD-10-CM | POA: Diagnosis not present

## 2019-09-03 DIAGNOSIS — E039 Hypothyroidism, unspecified: Secondary | ICD-10-CM | POA: Diagnosis not present

## 2019-09-03 DIAGNOSIS — R2689 Other abnormalities of gait and mobility: Secondary | ICD-10-CM | POA: Diagnosis not present

## 2019-09-03 DIAGNOSIS — M069 Rheumatoid arthritis, unspecified: Secondary | ICD-10-CM | POA: Diagnosis not present

## 2019-09-04 DIAGNOSIS — E1122 Type 2 diabetes mellitus with diabetic chronic kidney disease: Secondary | ICD-10-CM | POA: Diagnosis not present

## 2019-09-04 DIAGNOSIS — K219 Gastro-esophageal reflux disease without esophagitis: Secondary | ICD-10-CM | POA: Diagnosis not present

## 2019-09-04 DIAGNOSIS — I4891 Unspecified atrial fibrillation: Secondary | ICD-10-CM | POA: Diagnosis not present

## 2019-09-04 DIAGNOSIS — E039 Hypothyroidism, unspecified: Secondary | ICD-10-CM | POA: Diagnosis not present

## 2019-09-04 DIAGNOSIS — I10 Essential (primary) hypertension: Secondary | ICD-10-CM | POA: Diagnosis not present

## 2019-09-04 DIAGNOSIS — R2689 Other abnormalities of gait and mobility: Secondary | ICD-10-CM | POA: Diagnosis not present

## 2019-09-04 DIAGNOSIS — D631 Anemia in chronic kidney disease: Secondary | ICD-10-CM | POA: Diagnosis not present

## 2019-09-04 DIAGNOSIS — R2681 Unsteadiness on feet: Secondary | ICD-10-CM | POA: Diagnosis not present

## 2019-09-04 DIAGNOSIS — M069 Rheumatoid arthritis, unspecified: Secondary | ICD-10-CM | POA: Diagnosis not present

## 2019-09-05 DIAGNOSIS — D631 Anemia in chronic kidney disease: Secondary | ICD-10-CM | POA: Diagnosis not present

## 2019-09-05 DIAGNOSIS — F329 Major depressive disorder, single episode, unspecified: Secondary | ICD-10-CM | POA: Diagnosis not present

## 2019-09-05 DIAGNOSIS — I10 Essential (primary) hypertension: Secondary | ICD-10-CM | POA: Diagnosis not present

## 2019-09-05 DIAGNOSIS — I4891 Unspecified atrial fibrillation: Secondary | ICD-10-CM | POA: Diagnosis not present

## 2019-09-05 DIAGNOSIS — R2681 Unsteadiness on feet: Secondary | ICD-10-CM | POA: Diagnosis not present

## 2019-09-05 DIAGNOSIS — E119 Type 2 diabetes mellitus without complications: Secondary | ICD-10-CM | POA: Diagnosis not present

## 2019-09-05 DIAGNOSIS — F0390 Unspecified dementia without behavioral disturbance: Secondary | ICD-10-CM | POA: Diagnosis not present

## 2019-09-05 DIAGNOSIS — M069 Rheumatoid arthritis, unspecified: Secondary | ICD-10-CM | POA: Diagnosis not present

## 2019-09-05 DIAGNOSIS — K219 Gastro-esophageal reflux disease without esophagitis: Secondary | ICD-10-CM | POA: Diagnosis not present

## 2019-09-05 DIAGNOSIS — B001 Herpesviral vesicular dermatitis: Secondary | ICD-10-CM | POA: Diagnosis not present

## 2019-09-05 DIAGNOSIS — R2689 Other abnormalities of gait and mobility: Secondary | ICD-10-CM | POA: Diagnosis not present

## 2019-09-05 DIAGNOSIS — E039 Hypothyroidism, unspecified: Secondary | ICD-10-CM | POA: Diagnosis not present

## 2019-09-05 DIAGNOSIS — E1122 Type 2 diabetes mellitus with diabetic chronic kidney disease: Secondary | ICD-10-CM | POA: Diagnosis not present

## 2019-09-06 DIAGNOSIS — R2689 Other abnormalities of gait and mobility: Secondary | ICD-10-CM | POA: Diagnosis not present

## 2019-09-06 DIAGNOSIS — E039 Hypothyroidism, unspecified: Secondary | ICD-10-CM | POA: Diagnosis not present

## 2019-09-06 DIAGNOSIS — I10 Essential (primary) hypertension: Secondary | ICD-10-CM | POA: Diagnosis not present

## 2019-09-06 DIAGNOSIS — D631 Anemia in chronic kidney disease: Secondary | ICD-10-CM | POA: Diagnosis not present

## 2019-09-06 DIAGNOSIS — E1122 Type 2 diabetes mellitus with diabetic chronic kidney disease: Secondary | ICD-10-CM | POA: Diagnosis not present

## 2019-09-06 DIAGNOSIS — R2681 Unsteadiness on feet: Secondary | ICD-10-CM | POA: Diagnosis not present

## 2019-09-06 DIAGNOSIS — M069 Rheumatoid arthritis, unspecified: Secondary | ICD-10-CM | POA: Diagnosis not present

## 2019-09-06 DIAGNOSIS — I4891 Unspecified atrial fibrillation: Secondary | ICD-10-CM | POA: Diagnosis not present

## 2019-09-06 DIAGNOSIS — K219 Gastro-esophageal reflux disease without esophagitis: Secondary | ICD-10-CM | POA: Diagnosis not present

## 2019-09-07 DIAGNOSIS — F419 Anxiety disorder, unspecified: Secondary | ICD-10-CM | POA: Diagnosis not present

## 2019-09-07 DIAGNOSIS — I4821 Permanent atrial fibrillation: Secondary | ICD-10-CM | POA: Diagnosis not present

## 2019-09-07 DIAGNOSIS — M069 Rheumatoid arthritis, unspecified: Secondary | ICD-10-CM | POA: Diagnosis not present

## 2019-09-07 DIAGNOSIS — F0390 Unspecified dementia without behavioral disturbance: Secondary | ICD-10-CM | POA: Diagnosis not present

## 2019-09-07 DIAGNOSIS — F329 Major depressive disorder, single episode, unspecified: Secondary | ICD-10-CM | POA: Diagnosis not present

## 2019-09-07 DIAGNOSIS — I5032 Chronic diastolic (congestive) heart failure: Secondary | ICD-10-CM | POA: Diagnosis not present

## 2019-09-09 DIAGNOSIS — M069 Rheumatoid arthritis, unspecified: Secondary | ICD-10-CM | POA: Diagnosis not present

## 2019-09-09 DIAGNOSIS — I4891 Unspecified atrial fibrillation: Secondary | ICD-10-CM | POA: Diagnosis not present

## 2019-09-09 DIAGNOSIS — E1122 Type 2 diabetes mellitus with diabetic chronic kidney disease: Secondary | ICD-10-CM | POA: Diagnosis not present

## 2019-09-09 DIAGNOSIS — R2681 Unsteadiness on feet: Secondary | ICD-10-CM | POA: Diagnosis not present

## 2019-09-09 DIAGNOSIS — K219 Gastro-esophageal reflux disease without esophagitis: Secondary | ICD-10-CM | POA: Diagnosis not present

## 2019-09-09 DIAGNOSIS — I10 Essential (primary) hypertension: Secondary | ICD-10-CM | POA: Diagnosis not present

## 2019-09-09 DIAGNOSIS — R2689 Other abnormalities of gait and mobility: Secondary | ICD-10-CM | POA: Diagnosis not present

## 2019-09-09 DIAGNOSIS — E039 Hypothyroidism, unspecified: Secondary | ICD-10-CM | POA: Diagnosis not present

## 2019-09-09 DIAGNOSIS — D631 Anemia in chronic kidney disease: Secondary | ICD-10-CM | POA: Diagnosis not present

## 2019-09-10 DIAGNOSIS — E1122 Type 2 diabetes mellitus with diabetic chronic kidney disease: Secondary | ICD-10-CM | POA: Diagnosis not present

## 2019-09-10 DIAGNOSIS — K219 Gastro-esophageal reflux disease without esophagitis: Secondary | ICD-10-CM | POA: Diagnosis not present

## 2019-09-10 DIAGNOSIS — M069 Rheumatoid arthritis, unspecified: Secondary | ICD-10-CM | POA: Diagnosis not present

## 2019-09-10 DIAGNOSIS — E039 Hypothyroidism, unspecified: Secondary | ICD-10-CM | POA: Diagnosis not present

## 2019-09-10 DIAGNOSIS — I4891 Unspecified atrial fibrillation: Secondary | ICD-10-CM | POA: Diagnosis not present

## 2019-09-10 DIAGNOSIS — R2689 Other abnormalities of gait and mobility: Secondary | ICD-10-CM | POA: Diagnosis not present

## 2019-09-10 DIAGNOSIS — I10 Essential (primary) hypertension: Secondary | ICD-10-CM | POA: Diagnosis not present

## 2019-09-10 DIAGNOSIS — R2681 Unsteadiness on feet: Secondary | ICD-10-CM | POA: Diagnosis not present

## 2019-09-10 DIAGNOSIS — D631 Anemia in chronic kidney disease: Secondary | ICD-10-CM | POA: Diagnosis not present

## 2019-09-12 DIAGNOSIS — R319 Hematuria, unspecified: Secondary | ICD-10-CM | POA: Diagnosis not present

## 2019-09-12 DIAGNOSIS — E039 Hypothyroidism, unspecified: Secondary | ICD-10-CM | POA: Diagnosis not present

## 2019-09-12 DIAGNOSIS — R2681 Unsteadiness on feet: Secondary | ICD-10-CM | POA: Diagnosis not present

## 2019-09-12 DIAGNOSIS — D631 Anemia in chronic kidney disease: Secondary | ICD-10-CM | POA: Diagnosis not present

## 2019-09-12 DIAGNOSIS — Z79899 Other long term (current) drug therapy: Secondary | ICD-10-CM | POA: Diagnosis not present

## 2019-09-12 DIAGNOSIS — K219 Gastro-esophageal reflux disease without esophagitis: Secondary | ICD-10-CM | POA: Diagnosis not present

## 2019-09-12 DIAGNOSIS — M069 Rheumatoid arthritis, unspecified: Secondary | ICD-10-CM | POA: Diagnosis not present

## 2019-09-12 DIAGNOSIS — I10 Essential (primary) hypertension: Secondary | ICD-10-CM | POA: Diagnosis not present

## 2019-09-12 DIAGNOSIS — N390 Urinary tract infection, site not specified: Secondary | ICD-10-CM | POA: Diagnosis not present

## 2019-09-12 DIAGNOSIS — E1122 Type 2 diabetes mellitus with diabetic chronic kidney disease: Secondary | ICD-10-CM | POA: Diagnosis not present

## 2019-09-12 DIAGNOSIS — R2689 Other abnormalities of gait and mobility: Secondary | ICD-10-CM | POA: Diagnosis not present

## 2019-09-12 DIAGNOSIS — I4891 Unspecified atrial fibrillation: Secondary | ICD-10-CM | POA: Diagnosis not present

## 2019-09-13 DIAGNOSIS — R2689 Other abnormalities of gait and mobility: Secondary | ICD-10-CM | POA: Diagnosis not present

## 2019-09-13 DIAGNOSIS — D631 Anemia in chronic kidney disease: Secondary | ICD-10-CM | POA: Diagnosis not present

## 2019-09-13 DIAGNOSIS — K219 Gastro-esophageal reflux disease without esophagitis: Secondary | ICD-10-CM | POA: Diagnosis not present

## 2019-09-13 DIAGNOSIS — I4891 Unspecified atrial fibrillation: Secondary | ICD-10-CM | POA: Diagnosis not present

## 2019-09-13 DIAGNOSIS — R2681 Unsteadiness on feet: Secondary | ICD-10-CM | POA: Diagnosis not present

## 2019-09-13 DIAGNOSIS — I10 Essential (primary) hypertension: Secondary | ICD-10-CM | POA: Diagnosis not present

## 2019-09-13 DIAGNOSIS — M069 Rheumatoid arthritis, unspecified: Secondary | ICD-10-CM | POA: Diagnosis not present

## 2019-09-13 DIAGNOSIS — E1122 Type 2 diabetes mellitus with diabetic chronic kidney disease: Secondary | ICD-10-CM | POA: Diagnosis not present

## 2019-09-13 DIAGNOSIS — E039 Hypothyroidism, unspecified: Secondary | ICD-10-CM | POA: Diagnosis not present

## 2019-09-14 DIAGNOSIS — Z03818 Encounter for observation for suspected exposure to other biological agents ruled out: Secondary | ICD-10-CM | POA: Diagnosis not present

## 2019-09-15 DIAGNOSIS — I5032 Chronic diastolic (congestive) heart failure: Secondary | ICD-10-CM | POA: Diagnosis not present

## 2019-09-15 DIAGNOSIS — I4821 Permanent atrial fibrillation: Secondary | ICD-10-CM | POA: Diagnosis not present

## 2019-09-15 DIAGNOSIS — F329 Major depressive disorder, single episode, unspecified: Secondary | ICD-10-CM | POA: Diagnosis not present

## 2019-09-15 DIAGNOSIS — I10 Essential (primary) hypertension: Secondary | ICD-10-CM | POA: Diagnosis not present

## 2019-09-15 DIAGNOSIS — F0390 Unspecified dementia without behavioral disturbance: Secondary | ICD-10-CM | POA: Diagnosis not present

## 2019-09-15 DIAGNOSIS — F419 Anxiety disorder, unspecified: Secondary | ICD-10-CM | POA: Diagnosis not present

## 2019-09-15 DIAGNOSIS — N390 Urinary tract infection, site not specified: Secondary | ICD-10-CM | POA: Diagnosis not present

## 2019-09-16 DIAGNOSIS — K219 Gastro-esophageal reflux disease without esophagitis: Secondary | ICD-10-CM | POA: Diagnosis not present

## 2019-09-16 DIAGNOSIS — E039 Hypothyroidism, unspecified: Secondary | ICD-10-CM | POA: Diagnosis not present

## 2019-09-16 DIAGNOSIS — M069 Rheumatoid arthritis, unspecified: Secondary | ICD-10-CM | POA: Diagnosis not present

## 2019-09-16 DIAGNOSIS — I10 Essential (primary) hypertension: Secondary | ICD-10-CM | POA: Diagnosis not present

## 2019-09-16 DIAGNOSIS — R2681 Unsteadiness on feet: Secondary | ICD-10-CM | POA: Diagnosis not present

## 2019-09-16 DIAGNOSIS — I4891 Unspecified atrial fibrillation: Secondary | ICD-10-CM | POA: Diagnosis not present

## 2019-09-16 DIAGNOSIS — D631 Anemia in chronic kidney disease: Secondary | ICD-10-CM | POA: Diagnosis not present

## 2019-09-16 DIAGNOSIS — E1122 Type 2 diabetes mellitus with diabetic chronic kidney disease: Secondary | ICD-10-CM | POA: Diagnosis not present

## 2019-09-16 DIAGNOSIS — R2689 Other abnormalities of gait and mobility: Secondary | ICD-10-CM | POA: Diagnosis not present

## 2019-09-17 DIAGNOSIS — R2681 Unsteadiness on feet: Secondary | ICD-10-CM | POA: Diagnosis not present

## 2019-09-17 DIAGNOSIS — K219 Gastro-esophageal reflux disease without esophagitis: Secondary | ICD-10-CM | POA: Diagnosis not present

## 2019-09-17 DIAGNOSIS — I4891 Unspecified atrial fibrillation: Secondary | ICD-10-CM | POA: Diagnosis not present

## 2019-09-17 DIAGNOSIS — R2689 Other abnormalities of gait and mobility: Secondary | ICD-10-CM | POA: Diagnosis not present

## 2019-09-17 DIAGNOSIS — E1122 Type 2 diabetes mellitus with diabetic chronic kidney disease: Secondary | ICD-10-CM | POA: Diagnosis not present

## 2019-09-17 DIAGNOSIS — D631 Anemia in chronic kidney disease: Secondary | ICD-10-CM | POA: Diagnosis not present

## 2019-09-17 DIAGNOSIS — M069 Rheumatoid arthritis, unspecified: Secondary | ICD-10-CM | POA: Diagnosis not present

## 2019-09-17 DIAGNOSIS — I10 Essential (primary) hypertension: Secondary | ICD-10-CM | POA: Diagnosis not present

## 2019-09-17 DIAGNOSIS — E039 Hypothyroidism, unspecified: Secondary | ICD-10-CM | POA: Diagnosis not present

## 2019-09-18 DIAGNOSIS — E1122 Type 2 diabetes mellitus with diabetic chronic kidney disease: Secondary | ICD-10-CM | POA: Diagnosis not present

## 2019-09-18 DIAGNOSIS — R2689 Other abnormalities of gait and mobility: Secondary | ICD-10-CM | POA: Diagnosis not present

## 2019-09-18 DIAGNOSIS — R2681 Unsteadiness on feet: Secondary | ICD-10-CM | POA: Diagnosis not present

## 2019-09-18 DIAGNOSIS — Z20828 Contact with and (suspected) exposure to other viral communicable diseases: Secondary | ICD-10-CM | POA: Diagnosis not present

## 2019-09-18 DIAGNOSIS — I10 Essential (primary) hypertension: Secondary | ICD-10-CM | POA: Diagnosis not present

## 2019-09-18 DIAGNOSIS — D631 Anemia in chronic kidney disease: Secondary | ICD-10-CM | POA: Diagnosis not present

## 2019-09-18 DIAGNOSIS — I4891 Unspecified atrial fibrillation: Secondary | ICD-10-CM | POA: Diagnosis not present

## 2019-09-18 DIAGNOSIS — M069 Rheumatoid arthritis, unspecified: Secondary | ICD-10-CM | POA: Diagnosis not present

## 2019-09-18 DIAGNOSIS — K219 Gastro-esophageal reflux disease without esophagitis: Secondary | ICD-10-CM | POA: Diagnosis not present

## 2019-09-18 DIAGNOSIS — E039 Hypothyroidism, unspecified: Secondary | ICD-10-CM | POA: Diagnosis not present

## 2019-09-19 DIAGNOSIS — E1122 Type 2 diabetes mellitus with diabetic chronic kidney disease: Secondary | ICD-10-CM | POA: Diagnosis not present

## 2019-09-19 DIAGNOSIS — K219 Gastro-esophageal reflux disease without esophagitis: Secondary | ICD-10-CM | POA: Diagnosis not present

## 2019-09-19 DIAGNOSIS — E039 Hypothyroidism, unspecified: Secondary | ICD-10-CM | POA: Diagnosis not present

## 2019-09-19 DIAGNOSIS — F419 Anxiety disorder, unspecified: Secondary | ICD-10-CM | POA: Diagnosis not present

## 2019-09-19 DIAGNOSIS — F0390 Unspecified dementia without behavioral disturbance: Secondary | ICD-10-CM | POA: Diagnosis not present

## 2019-09-19 DIAGNOSIS — F329 Major depressive disorder, single episode, unspecified: Secondary | ICD-10-CM | POA: Diagnosis not present

## 2019-09-19 DIAGNOSIS — I4891 Unspecified atrial fibrillation: Secondary | ICD-10-CM | POA: Diagnosis not present

## 2019-09-19 DIAGNOSIS — I4821 Permanent atrial fibrillation: Secondary | ICD-10-CM | POA: Diagnosis not present

## 2019-09-19 DIAGNOSIS — I10 Essential (primary) hypertension: Secondary | ICD-10-CM | POA: Diagnosis not present

## 2019-09-19 DIAGNOSIS — R2681 Unsteadiness on feet: Secondary | ICD-10-CM | POA: Diagnosis not present

## 2019-09-19 DIAGNOSIS — M069 Rheumatoid arthritis, unspecified: Secondary | ICD-10-CM | POA: Diagnosis not present

## 2019-09-19 DIAGNOSIS — N390 Urinary tract infection, site not specified: Secondary | ICD-10-CM | POA: Diagnosis not present

## 2019-09-19 DIAGNOSIS — I5032 Chronic diastolic (congestive) heart failure: Secondary | ICD-10-CM | POA: Diagnosis not present

## 2019-09-19 DIAGNOSIS — D631 Anemia in chronic kidney disease: Secondary | ICD-10-CM | POA: Diagnosis not present

## 2019-09-19 DIAGNOSIS — R2689 Other abnormalities of gait and mobility: Secondary | ICD-10-CM | POA: Diagnosis not present

## 2019-09-20 DIAGNOSIS — I10 Essential (primary) hypertension: Secondary | ICD-10-CM | POA: Diagnosis not present

## 2019-09-20 DIAGNOSIS — M069 Rheumatoid arthritis, unspecified: Secondary | ICD-10-CM | POA: Diagnosis not present

## 2019-09-20 DIAGNOSIS — E1122 Type 2 diabetes mellitus with diabetic chronic kidney disease: Secondary | ICD-10-CM | POA: Diagnosis not present

## 2019-09-20 DIAGNOSIS — R2689 Other abnormalities of gait and mobility: Secondary | ICD-10-CM | POA: Diagnosis not present

## 2019-09-20 DIAGNOSIS — E039 Hypothyroidism, unspecified: Secondary | ICD-10-CM | POA: Diagnosis not present

## 2019-09-20 DIAGNOSIS — I4891 Unspecified atrial fibrillation: Secondary | ICD-10-CM | POA: Diagnosis not present

## 2019-09-20 DIAGNOSIS — R2681 Unsteadiness on feet: Secondary | ICD-10-CM | POA: Diagnosis not present

## 2019-09-20 DIAGNOSIS — K219 Gastro-esophageal reflux disease without esophagitis: Secondary | ICD-10-CM | POA: Diagnosis not present

## 2019-09-20 DIAGNOSIS — D631 Anemia in chronic kidney disease: Secondary | ICD-10-CM | POA: Diagnosis not present

## 2019-09-23 DIAGNOSIS — E039 Hypothyroidism, unspecified: Secondary | ICD-10-CM | POA: Diagnosis not present

## 2019-09-23 DIAGNOSIS — I10 Essential (primary) hypertension: Secondary | ICD-10-CM | POA: Diagnosis not present

## 2019-09-23 DIAGNOSIS — K219 Gastro-esophageal reflux disease without esophagitis: Secondary | ICD-10-CM | POA: Diagnosis not present

## 2019-09-23 DIAGNOSIS — D631 Anemia in chronic kidney disease: Secondary | ICD-10-CM | POA: Diagnosis not present

## 2019-09-23 DIAGNOSIS — I4891 Unspecified atrial fibrillation: Secondary | ICD-10-CM | POA: Diagnosis not present

## 2019-09-23 DIAGNOSIS — R2689 Other abnormalities of gait and mobility: Secondary | ICD-10-CM | POA: Diagnosis not present

## 2019-09-23 DIAGNOSIS — E1122 Type 2 diabetes mellitus with diabetic chronic kidney disease: Secondary | ICD-10-CM | POA: Diagnosis not present

## 2019-09-23 DIAGNOSIS — M069 Rheumatoid arthritis, unspecified: Secondary | ICD-10-CM | POA: Diagnosis not present

## 2019-09-23 DIAGNOSIS — Z03818 Encounter for observation for suspected exposure to other biological agents ruled out: Secondary | ICD-10-CM | POA: Diagnosis not present

## 2019-09-23 DIAGNOSIS — R2681 Unsteadiness on feet: Secondary | ICD-10-CM | POA: Diagnosis not present

## 2019-09-24 DIAGNOSIS — G4700 Insomnia, unspecified: Secondary | ICD-10-CM | POA: Diagnosis not present

## 2019-09-24 DIAGNOSIS — R2681 Unsteadiness on feet: Secondary | ICD-10-CM | POA: Diagnosis not present

## 2019-09-24 DIAGNOSIS — F0151 Vascular dementia with behavioral disturbance: Secondary | ICD-10-CM | POA: Diagnosis not present

## 2019-09-24 DIAGNOSIS — E1122 Type 2 diabetes mellitus with diabetic chronic kidney disease: Secondary | ICD-10-CM | POA: Diagnosis not present

## 2019-09-24 DIAGNOSIS — I10 Essential (primary) hypertension: Secondary | ICD-10-CM | POA: Diagnosis not present

## 2019-09-24 DIAGNOSIS — G301 Alzheimer's disease with late onset: Secondary | ICD-10-CM | POA: Diagnosis not present

## 2019-09-24 DIAGNOSIS — R2689 Other abnormalities of gait and mobility: Secondary | ICD-10-CM | POA: Diagnosis not present

## 2019-09-24 DIAGNOSIS — E039 Hypothyroidism, unspecified: Secondary | ICD-10-CM | POA: Diagnosis not present

## 2019-09-24 DIAGNOSIS — F0632 Mood disorder due to known physiological condition with major depressive-like episode: Secondary | ICD-10-CM | POA: Diagnosis not present

## 2019-09-24 DIAGNOSIS — I4891 Unspecified atrial fibrillation: Secondary | ICD-10-CM | POA: Diagnosis not present

## 2019-09-24 DIAGNOSIS — M069 Rheumatoid arthritis, unspecified: Secondary | ICD-10-CM | POA: Diagnosis not present

## 2019-09-24 DIAGNOSIS — F331 Major depressive disorder, recurrent, moderate: Secondary | ICD-10-CM | POA: Diagnosis not present

## 2019-09-24 DIAGNOSIS — K219 Gastro-esophageal reflux disease without esophagitis: Secondary | ICD-10-CM | POA: Diagnosis not present

## 2019-09-24 DIAGNOSIS — D631 Anemia in chronic kidney disease: Secondary | ICD-10-CM | POA: Diagnosis not present

## 2019-09-25 DIAGNOSIS — I10 Essential (primary) hypertension: Secondary | ICD-10-CM | POA: Diagnosis not present

## 2019-09-25 DIAGNOSIS — M069 Rheumatoid arthritis, unspecified: Secondary | ICD-10-CM | POA: Diagnosis not present

## 2019-09-25 DIAGNOSIS — D631 Anemia in chronic kidney disease: Secondary | ICD-10-CM | POA: Diagnosis not present

## 2019-09-25 DIAGNOSIS — R2689 Other abnormalities of gait and mobility: Secondary | ICD-10-CM | POA: Diagnosis not present

## 2019-09-25 DIAGNOSIS — E039 Hypothyroidism, unspecified: Secondary | ICD-10-CM | POA: Diagnosis not present

## 2019-09-25 DIAGNOSIS — R2681 Unsteadiness on feet: Secondary | ICD-10-CM | POA: Diagnosis not present

## 2019-09-25 DIAGNOSIS — I4891 Unspecified atrial fibrillation: Secondary | ICD-10-CM | POA: Diagnosis not present

## 2019-09-25 DIAGNOSIS — E1122 Type 2 diabetes mellitus with diabetic chronic kidney disease: Secondary | ICD-10-CM | POA: Diagnosis not present

## 2019-09-25 DIAGNOSIS — K219 Gastro-esophageal reflux disease without esophagitis: Secondary | ICD-10-CM | POA: Diagnosis not present

## 2019-09-26 DIAGNOSIS — M2041 Other hammer toe(s) (acquired), right foot: Secondary | ICD-10-CM | POA: Diagnosis not present

## 2019-09-26 DIAGNOSIS — M2042 Other hammer toe(s) (acquired), left foot: Secondary | ICD-10-CM | POA: Diagnosis not present

## 2019-09-26 DIAGNOSIS — I4891 Unspecified atrial fibrillation: Secondary | ICD-10-CM | POA: Diagnosis not present

## 2019-09-26 DIAGNOSIS — S98131A Complete traumatic amputation of one right lesser toe, initial encounter: Secondary | ICD-10-CM | POA: Diagnosis not present

## 2019-09-26 DIAGNOSIS — K219 Gastro-esophageal reflux disease without esophagitis: Secondary | ICD-10-CM | POA: Diagnosis not present

## 2019-09-26 DIAGNOSIS — D631 Anemia in chronic kidney disease: Secondary | ICD-10-CM | POA: Diagnosis not present

## 2019-09-26 DIAGNOSIS — M069 Rheumatoid arthritis, unspecified: Secondary | ICD-10-CM | POA: Diagnosis not present

## 2019-09-26 DIAGNOSIS — E1142 Type 2 diabetes mellitus with diabetic polyneuropathy: Secondary | ICD-10-CM | POA: Diagnosis not present

## 2019-09-26 DIAGNOSIS — I10 Essential (primary) hypertension: Secondary | ICD-10-CM | POA: Diagnosis not present

## 2019-09-26 DIAGNOSIS — E039 Hypothyroidism, unspecified: Secondary | ICD-10-CM | POA: Diagnosis not present

## 2019-09-26 DIAGNOSIS — L602 Onychogryphosis: Secondary | ICD-10-CM | POA: Diagnosis not present

## 2019-09-26 DIAGNOSIS — E1122 Type 2 diabetes mellitus with diabetic chronic kidney disease: Secondary | ICD-10-CM | POA: Diagnosis not present

## 2019-09-26 DIAGNOSIS — R2681 Unsteadiness on feet: Secondary | ICD-10-CM | POA: Diagnosis not present

## 2019-09-26 DIAGNOSIS — R2689 Other abnormalities of gait and mobility: Secondary | ICD-10-CM | POA: Diagnosis not present

## 2019-09-26 DIAGNOSIS — L859 Epidermal thickening, unspecified: Secondary | ICD-10-CM | POA: Diagnosis not present

## 2019-09-27 DIAGNOSIS — D631 Anemia in chronic kidney disease: Secondary | ICD-10-CM | POA: Diagnosis not present

## 2019-09-27 DIAGNOSIS — K219 Gastro-esophageal reflux disease without esophagitis: Secondary | ICD-10-CM | POA: Diagnosis not present

## 2019-09-27 DIAGNOSIS — I10 Essential (primary) hypertension: Secondary | ICD-10-CM | POA: Diagnosis not present

## 2019-09-27 DIAGNOSIS — E039 Hypothyroidism, unspecified: Secondary | ICD-10-CM | POA: Diagnosis not present

## 2019-09-27 DIAGNOSIS — E119 Type 2 diabetes mellitus without complications: Secondary | ICD-10-CM | POA: Diagnosis not present

## 2019-09-27 DIAGNOSIS — E1122 Type 2 diabetes mellitus with diabetic chronic kidney disease: Secondary | ICD-10-CM | POA: Diagnosis not present

## 2019-09-27 DIAGNOSIS — F419 Anxiety disorder, unspecified: Secondary | ICD-10-CM | POA: Diagnosis not present

## 2019-09-27 DIAGNOSIS — F329 Major depressive disorder, single episode, unspecified: Secondary | ICD-10-CM | POA: Diagnosis not present

## 2019-09-27 DIAGNOSIS — M069 Rheumatoid arthritis, unspecified: Secondary | ICD-10-CM | POA: Diagnosis not present

## 2019-09-27 DIAGNOSIS — F0390 Unspecified dementia without behavioral disturbance: Secondary | ICD-10-CM | POA: Diagnosis not present

## 2019-09-27 DIAGNOSIS — R2689 Other abnormalities of gait and mobility: Secondary | ICD-10-CM | POA: Diagnosis not present

## 2019-09-27 DIAGNOSIS — I4891 Unspecified atrial fibrillation: Secondary | ICD-10-CM | POA: Diagnosis not present

## 2019-09-27 DIAGNOSIS — D6489 Other specified anemias: Secondary | ICD-10-CM | POA: Diagnosis not present

## 2019-09-27 DIAGNOSIS — B001 Herpesviral vesicular dermatitis: Secondary | ICD-10-CM | POA: Diagnosis not present

## 2019-09-27 DIAGNOSIS — R2681 Unsteadiness on feet: Secondary | ICD-10-CM | POA: Diagnosis not present

## 2019-09-30 DIAGNOSIS — I4891 Unspecified atrial fibrillation: Secondary | ICD-10-CM | POA: Diagnosis not present

## 2019-09-30 DIAGNOSIS — M069 Rheumatoid arthritis, unspecified: Secondary | ICD-10-CM | POA: Diagnosis not present

## 2019-09-30 DIAGNOSIS — E1122 Type 2 diabetes mellitus with diabetic chronic kidney disease: Secondary | ICD-10-CM | POA: Diagnosis not present

## 2019-09-30 DIAGNOSIS — I10 Essential (primary) hypertension: Secondary | ICD-10-CM | POA: Diagnosis not present

## 2019-09-30 DIAGNOSIS — R2689 Other abnormalities of gait and mobility: Secondary | ICD-10-CM | POA: Diagnosis not present

## 2019-09-30 DIAGNOSIS — R2681 Unsteadiness on feet: Secondary | ICD-10-CM | POA: Diagnosis not present

## 2019-09-30 DIAGNOSIS — E039 Hypothyroidism, unspecified: Secondary | ICD-10-CM | POA: Diagnosis not present

## 2019-09-30 DIAGNOSIS — D631 Anemia in chronic kidney disease: Secondary | ICD-10-CM | POA: Diagnosis not present

## 2019-09-30 DIAGNOSIS — K219 Gastro-esophageal reflux disease without esophagitis: Secondary | ICD-10-CM | POA: Diagnosis not present

## 2019-10-01 DIAGNOSIS — D631 Anemia in chronic kidney disease: Secondary | ICD-10-CM | POA: Diagnosis not present

## 2019-10-01 DIAGNOSIS — E039 Hypothyroidism, unspecified: Secondary | ICD-10-CM | POA: Diagnosis not present

## 2019-10-01 DIAGNOSIS — M069 Rheumatoid arthritis, unspecified: Secondary | ICD-10-CM | POA: Diagnosis not present

## 2019-10-01 DIAGNOSIS — R2681 Unsteadiness on feet: Secondary | ICD-10-CM | POA: Diagnosis not present

## 2019-10-01 DIAGNOSIS — R2689 Other abnormalities of gait and mobility: Secondary | ICD-10-CM | POA: Diagnosis not present

## 2019-10-01 DIAGNOSIS — K219 Gastro-esophageal reflux disease without esophagitis: Secondary | ICD-10-CM | POA: Diagnosis not present

## 2019-10-01 DIAGNOSIS — I10 Essential (primary) hypertension: Secondary | ICD-10-CM | POA: Diagnosis not present

## 2019-10-01 DIAGNOSIS — I4891 Unspecified atrial fibrillation: Secondary | ICD-10-CM | POA: Diagnosis not present

## 2019-10-01 DIAGNOSIS — E1122 Type 2 diabetes mellitus with diabetic chronic kidney disease: Secondary | ICD-10-CM | POA: Diagnosis not present

## 2019-10-02 DIAGNOSIS — R2689 Other abnormalities of gait and mobility: Secondary | ICD-10-CM | POA: Diagnosis not present

## 2019-10-02 DIAGNOSIS — E1122 Type 2 diabetes mellitus with diabetic chronic kidney disease: Secondary | ICD-10-CM | POA: Diagnosis not present

## 2019-10-02 DIAGNOSIS — D631 Anemia in chronic kidney disease: Secondary | ICD-10-CM | POA: Diagnosis not present

## 2019-10-02 DIAGNOSIS — I4891 Unspecified atrial fibrillation: Secondary | ICD-10-CM | POA: Diagnosis not present

## 2019-10-02 DIAGNOSIS — M069 Rheumatoid arthritis, unspecified: Secondary | ICD-10-CM | POA: Diagnosis not present

## 2019-10-02 DIAGNOSIS — K219 Gastro-esophageal reflux disease without esophagitis: Secondary | ICD-10-CM | POA: Diagnosis not present

## 2019-10-02 DIAGNOSIS — I10 Essential (primary) hypertension: Secondary | ICD-10-CM | POA: Diagnosis not present

## 2019-10-02 DIAGNOSIS — E039 Hypothyroidism, unspecified: Secondary | ICD-10-CM | POA: Diagnosis not present

## 2019-10-02 DIAGNOSIS — R2681 Unsteadiness on feet: Secondary | ICD-10-CM | POA: Diagnosis not present

## 2019-10-03 DIAGNOSIS — F419 Anxiety disorder, unspecified: Secondary | ICD-10-CM | POA: Diagnosis not present

## 2019-10-03 DIAGNOSIS — R2689 Other abnormalities of gait and mobility: Secondary | ICD-10-CM | POA: Diagnosis not present

## 2019-10-03 DIAGNOSIS — I10 Essential (primary) hypertension: Secondary | ICD-10-CM | POA: Diagnosis not present

## 2019-10-03 DIAGNOSIS — K219 Gastro-esophageal reflux disease without esophagitis: Secondary | ICD-10-CM | POA: Diagnosis not present

## 2019-10-03 DIAGNOSIS — R2681 Unsteadiness on feet: Secondary | ICD-10-CM | POA: Diagnosis not present

## 2019-10-03 DIAGNOSIS — E039 Hypothyroidism, unspecified: Secondary | ICD-10-CM | POA: Diagnosis not present

## 2019-10-03 DIAGNOSIS — E1122 Type 2 diabetes mellitus with diabetic chronic kidney disease: Secondary | ICD-10-CM | POA: Diagnosis not present

## 2019-10-03 DIAGNOSIS — D631 Anemia in chronic kidney disease: Secondary | ICD-10-CM | POA: Diagnosis not present

## 2019-10-03 DIAGNOSIS — E119 Type 2 diabetes mellitus without complications: Secondary | ICD-10-CM | POA: Diagnosis not present

## 2019-10-03 DIAGNOSIS — F329 Major depressive disorder, single episode, unspecified: Secondary | ICD-10-CM | POA: Diagnosis not present

## 2019-10-03 DIAGNOSIS — I4891 Unspecified atrial fibrillation: Secondary | ICD-10-CM | POA: Diagnosis not present

## 2019-10-03 DIAGNOSIS — D6489 Other specified anemias: Secondary | ICD-10-CM | POA: Diagnosis not present

## 2019-10-03 DIAGNOSIS — F0390 Unspecified dementia without behavioral disturbance: Secondary | ICD-10-CM | POA: Diagnosis not present

## 2019-10-03 DIAGNOSIS — M069 Rheumatoid arthritis, unspecified: Secondary | ICD-10-CM | POA: Diagnosis not present

## 2019-10-05 DIAGNOSIS — F419 Anxiety disorder, unspecified: Secondary | ICD-10-CM | POA: Diagnosis not present

## 2019-10-05 DIAGNOSIS — F0390 Unspecified dementia without behavioral disturbance: Secondary | ICD-10-CM | POA: Diagnosis not present

## 2019-10-05 DIAGNOSIS — F329 Major depressive disorder, single episode, unspecified: Secondary | ICD-10-CM | POA: Diagnosis not present

## 2019-10-05 DIAGNOSIS — I4821 Permanent atrial fibrillation: Secondary | ICD-10-CM | POA: Diagnosis not present

## 2019-10-05 DIAGNOSIS — I5032 Chronic diastolic (congestive) heart failure: Secondary | ICD-10-CM | POA: Diagnosis not present

## 2019-10-06 DIAGNOSIS — R2681 Unsteadiness on feet: Secondary | ICD-10-CM | POA: Diagnosis not present

## 2019-10-06 DIAGNOSIS — I4891 Unspecified atrial fibrillation: Secondary | ICD-10-CM | POA: Diagnosis not present

## 2019-10-06 DIAGNOSIS — R2689 Other abnormalities of gait and mobility: Secondary | ICD-10-CM | POA: Diagnosis not present

## 2019-10-06 DIAGNOSIS — K219 Gastro-esophageal reflux disease without esophagitis: Secondary | ICD-10-CM | POA: Diagnosis not present

## 2019-10-06 DIAGNOSIS — E1122 Type 2 diabetes mellitus with diabetic chronic kidney disease: Secondary | ICD-10-CM | POA: Diagnosis not present

## 2019-10-06 DIAGNOSIS — I10 Essential (primary) hypertension: Secondary | ICD-10-CM | POA: Diagnosis not present

## 2019-10-06 DIAGNOSIS — M069 Rheumatoid arthritis, unspecified: Secondary | ICD-10-CM | POA: Diagnosis not present

## 2019-10-06 DIAGNOSIS — E039 Hypothyroidism, unspecified: Secondary | ICD-10-CM | POA: Diagnosis not present

## 2019-10-06 DIAGNOSIS — D631 Anemia in chronic kidney disease: Secondary | ICD-10-CM | POA: Diagnosis not present

## 2019-10-07 DIAGNOSIS — F329 Major depressive disorder, single episode, unspecified: Secondary | ICD-10-CM | POA: Diagnosis not present

## 2019-10-07 DIAGNOSIS — E7849 Other hyperlipidemia: Secondary | ICD-10-CM | POA: Diagnosis not present

## 2019-10-07 DIAGNOSIS — E119 Type 2 diabetes mellitus without complications: Secondary | ICD-10-CM | POA: Diagnosis not present

## 2019-10-07 DIAGNOSIS — F0390 Unspecified dementia without behavioral disturbance: Secondary | ICD-10-CM | POA: Diagnosis not present

## 2019-10-07 DIAGNOSIS — D6489 Other specified anemias: Secondary | ICD-10-CM | POA: Diagnosis not present

## 2019-10-07 DIAGNOSIS — M069 Rheumatoid arthritis, unspecified: Secondary | ICD-10-CM | POA: Diagnosis not present

## 2019-10-07 DIAGNOSIS — I4891 Unspecified atrial fibrillation: Secondary | ICD-10-CM | POA: Diagnosis not present

## 2019-10-07 DIAGNOSIS — E1122 Type 2 diabetes mellitus with diabetic chronic kidney disease: Secondary | ICD-10-CM | POA: Diagnosis not present

## 2019-10-07 DIAGNOSIS — F419 Anxiety disorder, unspecified: Secondary | ICD-10-CM | POA: Diagnosis not present

## 2019-10-07 DIAGNOSIS — E039 Hypothyroidism, unspecified: Secondary | ICD-10-CM | POA: Diagnosis not present

## 2019-10-07 DIAGNOSIS — R2689 Other abnormalities of gait and mobility: Secondary | ICD-10-CM | POA: Diagnosis not present

## 2019-10-07 DIAGNOSIS — R2681 Unsteadiness on feet: Secondary | ICD-10-CM | POA: Diagnosis not present

## 2019-10-07 DIAGNOSIS — D631 Anemia in chronic kidney disease: Secondary | ICD-10-CM | POA: Diagnosis not present

## 2019-10-07 DIAGNOSIS — K219 Gastro-esophageal reflux disease without esophagitis: Secondary | ICD-10-CM | POA: Diagnosis not present

## 2019-10-07 DIAGNOSIS — I10 Essential (primary) hypertension: Secondary | ICD-10-CM | POA: Diagnosis not present

## 2019-10-08 DIAGNOSIS — R2681 Unsteadiness on feet: Secondary | ICD-10-CM | POA: Diagnosis not present

## 2019-10-08 DIAGNOSIS — I4891 Unspecified atrial fibrillation: Secondary | ICD-10-CM | POA: Diagnosis not present

## 2019-10-08 DIAGNOSIS — I10 Essential (primary) hypertension: Secondary | ICD-10-CM | POA: Diagnosis not present

## 2019-10-08 DIAGNOSIS — M069 Rheumatoid arthritis, unspecified: Secondary | ICD-10-CM | POA: Diagnosis not present

## 2019-10-08 DIAGNOSIS — R2689 Other abnormalities of gait and mobility: Secondary | ICD-10-CM | POA: Diagnosis not present

## 2019-10-08 DIAGNOSIS — E039 Hypothyroidism, unspecified: Secondary | ICD-10-CM | POA: Diagnosis not present

## 2019-10-08 DIAGNOSIS — E1122 Type 2 diabetes mellitus with diabetic chronic kidney disease: Secondary | ICD-10-CM | POA: Diagnosis not present

## 2019-10-08 DIAGNOSIS — D631 Anemia in chronic kidney disease: Secondary | ICD-10-CM | POA: Diagnosis not present

## 2019-10-08 DIAGNOSIS — K219 Gastro-esophageal reflux disease without esophagitis: Secondary | ICD-10-CM | POA: Diagnosis not present

## 2019-10-09 DIAGNOSIS — D631 Anemia in chronic kidney disease: Secondary | ICD-10-CM | POA: Diagnosis not present

## 2019-10-09 DIAGNOSIS — K219 Gastro-esophageal reflux disease without esophagitis: Secondary | ICD-10-CM | POA: Diagnosis not present

## 2019-10-09 DIAGNOSIS — I4891 Unspecified atrial fibrillation: Secondary | ICD-10-CM | POA: Diagnosis not present

## 2019-10-09 DIAGNOSIS — R2689 Other abnormalities of gait and mobility: Secondary | ICD-10-CM | POA: Diagnosis not present

## 2019-10-09 DIAGNOSIS — E039 Hypothyroidism, unspecified: Secondary | ICD-10-CM | POA: Diagnosis not present

## 2019-10-09 DIAGNOSIS — R2681 Unsteadiness on feet: Secondary | ICD-10-CM | POA: Diagnosis not present

## 2019-10-09 DIAGNOSIS — E1122 Type 2 diabetes mellitus with diabetic chronic kidney disease: Secondary | ICD-10-CM | POA: Diagnosis not present

## 2019-10-09 DIAGNOSIS — I10 Essential (primary) hypertension: Secondary | ICD-10-CM | POA: Diagnosis not present

## 2019-10-09 DIAGNOSIS — M069 Rheumatoid arthritis, unspecified: Secondary | ICD-10-CM | POA: Diagnosis not present

## 2019-10-10 DIAGNOSIS — I10 Essential (primary) hypertension: Secondary | ICD-10-CM | POA: Diagnosis not present

## 2019-10-10 DIAGNOSIS — I4891 Unspecified atrial fibrillation: Secondary | ICD-10-CM | POA: Diagnosis not present

## 2019-10-10 DIAGNOSIS — M069 Rheumatoid arthritis, unspecified: Secondary | ICD-10-CM | POA: Diagnosis not present

## 2019-10-10 DIAGNOSIS — E1122 Type 2 diabetes mellitus with diabetic chronic kidney disease: Secondary | ICD-10-CM | POA: Diagnosis not present

## 2019-10-10 DIAGNOSIS — R2681 Unsteadiness on feet: Secondary | ICD-10-CM | POA: Diagnosis not present

## 2019-10-10 DIAGNOSIS — E039 Hypothyroidism, unspecified: Secondary | ICD-10-CM | POA: Diagnosis not present

## 2019-10-10 DIAGNOSIS — K219 Gastro-esophageal reflux disease without esophagitis: Secondary | ICD-10-CM | POA: Diagnosis not present

## 2019-10-10 DIAGNOSIS — D631 Anemia in chronic kidney disease: Secondary | ICD-10-CM | POA: Diagnosis not present

## 2019-10-10 DIAGNOSIS — R2689 Other abnormalities of gait and mobility: Secondary | ICD-10-CM | POA: Diagnosis not present

## 2019-10-11 DIAGNOSIS — R2681 Unsteadiness on feet: Secondary | ICD-10-CM | POA: Diagnosis not present

## 2019-10-11 DIAGNOSIS — E1122 Type 2 diabetes mellitus with diabetic chronic kidney disease: Secondary | ICD-10-CM | POA: Diagnosis not present

## 2019-10-11 DIAGNOSIS — K219 Gastro-esophageal reflux disease without esophagitis: Secondary | ICD-10-CM | POA: Diagnosis not present

## 2019-10-11 DIAGNOSIS — E039 Hypothyroidism, unspecified: Secondary | ICD-10-CM | POA: Diagnosis not present

## 2019-10-11 DIAGNOSIS — R2689 Other abnormalities of gait and mobility: Secondary | ICD-10-CM | POA: Diagnosis not present

## 2019-10-11 DIAGNOSIS — M069 Rheumatoid arthritis, unspecified: Secondary | ICD-10-CM | POA: Diagnosis not present

## 2019-10-11 DIAGNOSIS — D631 Anemia in chronic kidney disease: Secondary | ICD-10-CM | POA: Diagnosis not present

## 2019-10-11 DIAGNOSIS — I10 Essential (primary) hypertension: Secondary | ICD-10-CM | POA: Diagnosis not present

## 2019-10-11 DIAGNOSIS — I4891 Unspecified atrial fibrillation: Secondary | ICD-10-CM | POA: Diagnosis not present

## 2019-10-15 DIAGNOSIS — E1122 Type 2 diabetes mellitus with diabetic chronic kidney disease: Secondary | ICD-10-CM | POA: Diagnosis not present

## 2019-10-15 DIAGNOSIS — K219 Gastro-esophageal reflux disease without esophagitis: Secondary | ICD-10-CM | POA: Diagnosis not present

## 2019-10-15 DIAGNOSIS — R2689 Other abnormalities of gait and mobility: Secondary | ICD-10-CM | POA: Diagnosis not present

## 2019-10-15 DIAGNOSIS — I10 Essential (primary) hypertension: Secondary | ICD-10-CM | POA: Diagnosis not present

## 2019-10-15 DIAGNOSIS — I4891 Unspecified atrial fibrillation: Secondary | ICD-10-CM | POA: Diagnosis not present

## 2019-10-15 DIAGNOSIS — R2681 Unsteadiness on feet: Secondary | ICD-10-CM | POA: Diagnosis not present

## 2019-10-15 DIAGNOSIS — D631 Anemia in chronic kidney disease: Secondary | ICD-10-CM | POA: Diagnosis not present

## 2019-10-15 DIAGNOSIS — M069 Rheumatoid arthritis, unspecified: Secondary | ICD-10-CM | POA: Diagnosis not present

## 2019-10-15 DIAGNOSIS — E039 Hypothyroidism, unspecified: Secondary | ICD-10-CM | POA: Diagnosis not present

## 2019-10-16 DIAGNOSIS — M069 Rheumatoid arthritis, unspecified: Secondary | ICD-10-CM | POA: Diagnosis not present

## 2019-10-16 DIAGNOSIS — R2681 Unsteadiness on feet: Secondary | ICD-10-CM | POA: Diagnosis not present

## 2019-10-16 DIAGNOSIS — I10 Essential (primary) hypertension: Secondary | ICD-10-CM | POA: Diagnosis not present

## 2019-10-16 DIAGNOSIS — E119 Type 2 diabetes mellitus without complications: Secondary | ICD-10-CM | POA: Diagnosis not present

## 2019-10-16 DIAGNOSIS — R2689 Other abnormalities of gait and mobility: Secondary | ICD-10-CM | POA: Diagnosis not present

## 2019-10-16 DIAGNOSIS — I4891 Unspecified atrial fibrillation: Secondary | ICD-10-CM | POA: Diagnosis not present

## 2019-10-16 DIAGNOSIS — K219 Gastro-esophageal reflux disease without esophagitis: Secondary | ICD-10-CM | POA: Diagnosis not present

## 2019-10-16 DIAGNOSIS — F419 Anxiety disorder, unspecified: Secondary | ICD-10-CM | POA: Diagnosis not present

## 2019-10-16 DIAGNOSIS — D631 Anemia in chronic kidney disease: Secondary | ICD-10-CM | POA: Diagnosis not present

## 2019-10-16 DIAGNOSIS — E039 Hypothyroidism, unspecified: Secondary | ICD-10-CM | POA: Diagnosis not present

## 2019-10-16 DIAGNOSIS — E1122 Type 2 diabetes mellitus with diabetic chronic kidney disease: Secondary | ICD-10-CM | POA: Diagnosis not present

## 2019-10-16 DIAGNOSIS — F0390 Unspecified dementia without behavioral disturbance: Secondary | ICD-10-CM | POA: Diagnosis not present

## 2019-10-16 DIAGNOSIS — F329 Major depressive disorder, single episode, unspecified: Secondary | ICD-10-CM | POA: Diagnosis not present

## 2019-10-17 DIAGNOSIS — R2689 Other abnormalities of gait and mobility: Secondary | ICD-10-CM | POA: Diagnosis not present

## 2019-10-17 DIAGNOSIS — R2681 Unsteadiness on feet: Secondary | ICD-10-CM | POA: Diagnosis not present

## 2019-10-17 DIAGNOSIS — I10 Essential (primary) hypertension: Secondary | ICD-10-CM | POA: Diagnosis not present

## 2019-10-17 DIAGNOSIS — D631 Anemia in chronic kidney disease: Secondary | ICD-10-CM | POA: Diagnosis not present

## 2019-10-17 DIAGNOSIS — E039 Hypothyroidism, unspecified: Secondary | ICD-10-CM | POA: Diagnosis not present

## 2019-10-17 DIAGNOSIS — K219 Gastro-esophageal reflux disease without esophagitis: Secondary | ICD-10-CM | POA: Diagnosis not present

## 2019-10-17 DIAGNOSIS — I4891 Unspecified atrial fibrillation: Secondary | ICD-10-CM | POA: Diagnosis not present

## 2019-10-17 DIAGNOSIS — E1122 Type 2 diabetes mellitus with diabetic chronic kidney disease: Secondary | ICD-10-CM | POA: Diagnosis not present

## 2019-10-17 DIAGNOSIS — M069 Rheumatoid arthritis, unspecified: Secondary | ICD-10-CM | POA: Diagnosis not present

## 2019-10-18 DIAGNOSIS — R2681 Unsteadiness on feet: Secondary | ICD-10-CM | POA: Diagnosis not present

## 2019-10-18 DIAGNOSIS — K219 Gastro-esophageal reflux disease without esophagitis: Secondary | ICD-10-CM | POA: Diagnosis not present

## 2019-10-18 DIAGNOSIS — M069 Rheumatoid arthritis, unspecified: Secondary | ICD-10-CM | POA: Diagnosis not present

## 2019-10-18 DIAGNOSIS — D631 Anemia in chronic kidney disease: Secondary | ICD-10-CM | POA: Diagnosis not present

## 2019-10-18 DIAGNOSIS — I10 Essential (primary) hypertension: Secondary | ICD-10-CM | POA: Diagnosis not present

## 2019-10-18 DIAGNOSIS — I4891 Unspecified atrial fibrillation: Secondary | ICD-10-CM | POA: Diagnosis not present

## 2019-10-18 DIAGNOSIS — E039 Hypothyroidism, unspecified: Secondary | ICD-10-CM | POA: Diagnosis not present

## 2019-10-18 DIAGNOSIS — R2689 Other abnormalities of gait and mobility: Secondary | ICD-10-CM | POA: Diagnosis not present

## 2019-10-18 DIAGNOSIS — E1122 Type 2 diabetes mellitus with diabetic chronic kidney disease: Secondary | ICD-10-CM | POA: Diagnosis not present

## 2019-10-19 DIAGNOSIS — I4891 Unspecified atrial fibrillation: Secondary | ICD-10-CM | POA: Diagnosis not present

## 2019-10-19 DIAGNOSIS — K219 Gastro-esophageal reflux disease without esophagitis: Secondary | ICD-10-CM | POA: Diagnosis not present

## 2019-10-19 DIAGNOSIS — R2689 Other abnormalities of gait and mobility: Secondary | ICD-10-CM | POA: Diagnosis not present

## 2019-10-19 DIAGNOSIS — E039 Hypothyroidism, unspecified: Secondary | ICD-10-CM | POA: Diagnosis not present

## 2019-10-19 DIAGNOSIS — M069 Rheumatoid arthritis, unspecified: Secondary | ICD-10-CM | POA: Diagnosis not present

## 2019-10-19 DIAGNOSIS — I10 Essential (primary) hypertension: Secondary | ICD-10-CM | POA: Diagnosis not present

## 2019-10-19 DIAGNOSIS — E1122 Type 2 diabetes mellitus with diabetic chronic kidney disease: Secondary | ICD-10-CM | POA: Diagnosis not present

## 2019-10-19 DIAGNOSIS — D631 Anemia in chronic kidney disease: Secondary | ICD-10-CM | POA: Diagnosis not present

## 2019-10-19 DIAGNOSIS — R2681 Unsteadiness on feet: Secondary | ICD-10-CM | POA: Diagnosis not present

## 2019-10-21 DIAGNOSIS — D631 Anemia in chronic kidney disease: Secondary | ICD-10-CM | POA: Diagnosis not present

## 2019-10-21 DIAGNOSIS — R2681 Unsteadiness on feet: Secondary | ICD-10-CM | POA: Diagnosis not present

## 2019-10-21 DIAGNOSIS — E039 Hypothyroidism, unspecified: Secondary | ICD-10-CM | POA: Diagnosis not present

## 2019-10-21 DIAGNOSIS — M069 Rheumatoid arthritis, unspecified: Secondary | ICD-10-CM | POA: Diagnosis not present

## 2019-10-21 DIAGNOSIS — K219 Gastro-esophageal reflux disease without esophagitis: Secondary | ICD-10-CM | POA: Diagnosis not present

## 2019-10-21 DIAGNOSIS — I10 Essential (primary) hypertension: Secondary | ICD-10-CM | POA: Diagnosis not present

## 2019-10-21 DIAGNOSIS — E1122 Type 2 diabetes mellitus with diabetic chronic kidney disease: Secondary | ICD-10-CM | POA: Diagnosis not present

## 2019-10-21 DIAGNOSIS — I4891 Unspecified atrial fibrillation: Secondary | ICD-10-CM | POA: Diagnosis not present

## 2019-10-21 DIAGNOSIS — R2689 Other abnormalities of gait and mobility: Secondary | ICD-10-CM | POA: Diagnosis not present

## 2019-10-23 DIAGNOSIS — I10 Essential (primary) hypertension: Secondary | ICD-10-CM | POA: Diagnosis not present

## 2019-10-23 DIAGNOSIS — M069 Rheumatoid arthritis, unspecified: Secondary | ICD-10-CM | POA: Diagnosis not present

## 2019-10-23 DIAGNOSIS — I4891 Unspecified atrial fibrillation: Secondary | ICD-10-CM | POA: Diagnosis not present

## 2019-10-23 DIAGNOSIS — N189 Chronic kidney disease, unspecified: Secondary | ICD-10-CM | POA: Diagnosis not present

## 2019-10-24 DIAGNOSIS — E119 Type 2 diabetes mellitus without complications: Secondary | ICD-10-CM | POA: Diagnosis not present

## 2019-10-24 DIAGNOSIS — L89892 Pressure ulcer of other site, stage 2: Secondary | ICD-10-CM | POA: Diagnosis not present

## 2019-10-24 DIAGNOSIS — F329 Major depressive disorder, single episode, unspecified: Secondary | ICD-10-CM | POA: Diagnosis not present

## 2019-10-24 DIAGNOSIS — I10 Essential (primary) hypertension: Secondary | ICD-10-CM | POA: Diagnosis not present

## 2019-10-24 DIAGNOSIS — D6489 Other specified anemias: Secondary | ICD-10-CM | POA: Diagnosis not present

## 2019-10-24 DIAGNOSIS — F419 Anxiety disorder, unspecified: Secondary | ICD-10-CM | POA: Diagnosis not present

## 2019-10-24 DIAGNOSIS — E7849 Other hyperlipidemia: Secondary | ICD-10-CM | POA: Diagnosis not present

## 2019-10-24 DIAGNOSIS — F0390 Unspecified dementia without behavioral disturbance: Secondary | ICD-10-CM | POA: Diagnosis not present

## 2019-10-27 DIAGNOSIS — I482 Chronic atrial fibrillation, unspecified: Secondary | ICD-10-CM | POA: Diagnosis not present

## 2019-10-27 DIAGNOSIS — F0390 Unspecified dementia without behavioral disturbance: Secondary | ICD-10-CM | POA: Diagnosis not present

## 2019-10-27 DIAGNOSIS — F419 Anxiety disorder, unspecified: Secondary | ICD-10-CM | POA: Diagnosis not present

## 2019-10-27 DIAGNOSIS — L89892 Pressure ulcer of other site, stage 2: Secondary | ICD-10-CM | POA: Diagnosis not present

## 2019-10-27 DIAGNOSIS — I5032 Chronic diastolic (congestive) heart failure: Secondary | ICD-10-CM | POA: Diagnosis not present

## 2019-10-27 DIAGNOSIS — F329 Major depressive disorder, single episode, unspecified: Secondary | ICD-10-CM | POA: Diagnosis not present

## 2019-10-28 DIAGNOSIS — I10 Essential (primary) hypertension: Secondary | ICD-10-CM | POA: Diagnosis not present

## 2019-10-28 DIAGNOSIS — I5032 Chronic diastolic (congestive) heart failure: Secondary | ICD-10-CM | POA: Diagnosis not present

## 2019-10-28 DIAGNOSIS — F419 Anxiety disorder, unspecified: Secondary | ICD-10-CM | POA: Diagnosis not present

## 2019-10-28 DIAGNOSIS — F0390 Unspecified dementia without behavioral disturbance: Secondary | ICD-10-CM | POA: Diagnosis not present

## 2019-10-28 DIAGNOSIS — E119 Type 2 diabetes mellitus without complications: Secondary | ICD-10-CM | POA: Diagnosis not present

## 2019-10-28 DIAGNOSIS — D6489 Other specified anemias: Secondary | ICD-10-CM | POA: Diagnosis not present

## 2019-10-28 DIAGNOSIS — F329 Major depressive disorder, single episode, unspecified: Secondary | ICD-10-CM | POA: Diagnosis not present

## 2019-10-29 DIAGNOSIS — F331 Major depressive disorder, recurrent, moderate: Secondary | ICD-10-CM | POA: Diagnosis not present

## 2019-10-29 DIAGNOSIS — L97522 Non-pressure chronic ulcer of other part of left foot with fat layer exposed: Secondary | ICD-10-CM | POA: Diagnosis not present

## 2019-10-29 DIAGNOSIS — F419 Anxiety disorder, unspecified: Secondary | ICD-10-CM | POA: Diagnosis not present

## 2019-10-29 DIAGNOSIS — S8990XA Unspecified injury of unspecified lower leg, initial encounter: Secondary | ICD-10-CM | POA: Diagnosis not present

## 2019-10-29 DIAGNOSIS — G4700 Insomnia, unspecified: Secondary | ICD-10-CM | POA: Diagnosis not present

## 2019-10-29 DIAGNOSIS — F0390 Unspecified dementia without behavioral disturbance: Secondary | ICD-10-CM | POA: Diagnosis not present

## 2019-10-29 DIAGNOSIS — L89892 Pressure ulcer of other site, stage 2: Secondary | ICD-10-CM | POA: Diagnosis not present

## 2019-10-29 DIAGNOSIS — F329 Major depressive disorder, single episode, unspecified: Secondary | ICD-10-CM | POA: Diagnosis not present

## 2019-10-29 DIAGNOSIS — F0281 Dementia in other diseases classified elsewhere with behavioral disturbance: Secondary | ICD-10-CM | POA: Diagnosis not present

## 2019-10-29 DIAGNOSIS — E119 Type 2 diabetes mellitus without complications: Secondary | ICD-10-CM | POA: Diagnosis not present

## 2019-10-29 DIAGNOSIS — I10 Essential (primary) hypertension: Secondary | ICD-10-CM | POA: Diagnosis not present

## 2019-11-06 DIAGNOSIS — Z03818 Encounter for observation for suspected exposure to other biological agents ruled out: Secondary | ICD-10-CM | POA: Diagnosis not present

## 2019-11-06 DIAGNOSIS — S8990XA Unspecified injury of unspecified lower leg, initial encounter: Secondary | ICD-10-CM | POA: Diagnosis not present

## 2019-11-11 DIAGNOSIS — F0390 Unspecified dementia without behavioral disturbance: Secondary | ICD-10-CM | POA: Diagnosis not present

## 2019-11-11 DIAGNOSIS — F419 Anxiety disorder, unspecified: Secondary | ICD-10-CM | POA: Diagnosis not present

## 2019-11-11 DIAGNOSIS — E119 Type 2 diabetes mellitus without complications: Secondary | ICD-10-CM | POA: Diagnosis not present

## 2019-11-11 DIAGNOSIS — L89892 Pressure ulcer of other site, stage 2: Secondary | ICD-10-CM | POA: Diagnosis not present

## 2019-11-11 DIAGNOSIS — Z03818 Encounter for observation for suspected exposure to other biological agents ruled out: Secondary | ICD-10-CM | POA: Diagnosis not present

## 2019-11-14 DIAGNOSIS — Z20822 Contact with and (suspected) exposure to covid-19: Secondary | ICD-10-CM | POA: Diagnosis not present

## 2019-11-15 DIAGNOSIS — I482 Chronic atrial fibrillation, unspecified: Secondary | ICD-10-CM | POA: Diagnosis not present

## 2019-11-15 DIAGNOSIS — L89892 Pressure ulcer of other site, stage 2: Secondary | ICD-10-CM | POA: Diagnosis not present

## 2019-11-15 DIAGNOSIS — F329 Major depressive disorder, single episode, unspecified: Secondary | ICD-10-CM | POA: Diagnosis not present

## 2019-11-15 DIAGNOSIS — E7849 Other hyperlipidemia: Secondary | ICD-10-CM | POA: Diagnosis not present

## 2019-11-15 DIAGNOSIS — I10 Essential (primary) hypertension: Secondary | ICD-10-CM | POA: Diagnosis not present

## 2019-11-15 DIAGNOSIS — F419 Anxiety disorder, unspecified: Secondary | ICD-10-CM | POA: Diagnosis not present

## 2019-11-15 DIAGNOSIS — E119 Type 2 diabetes mellitus without complications: Secondary | ICD-10-CM | POA: Diagnosis not present

## 2019-11-15 DIAGNOSIS — F0390 Unspecified dementia without behavioral disturbance: Secondary | ICD-10-CM | POA: Diagnosis not present

## 2019-11-18 DIAGNOSIS — Z20822 Contact with and (suspected) exposure to covid-19: Secondary | ICD-10-CM | POA: Diagnosis not present

## 2019-11-21 DIAGNOSIS — Z20822 Contact with and (suspected) exposure to covid-19: Secondary | ICD-10-CM | POA: Diagnosis not present

## 2019-11-25 DIAGNOSIS — Z20822 Contact with and (suspected) exposure to covid-19: Secondary | ICD-10-CM | POA: Diagnosis not present

## 2019-11-26 DIAGNOSIS — D631 Anemia in chronic kidney disease: Secondary | ICD-10-CM | POA: Diagnosis not present

## 2019-11-26 DIAGNOSIS — R2689 Other abnormalities of gait and mobility: Secondary | ICD-10-CM | POA: Diagnosis not present

## 2019-11-26 DIAGNOSIS — I10 Essential (primary) hypertension: Secondary | ICD-10-CM | POA: Diagnosis not present

## 2019-11-26 DIAGNOSIS — I4891 Unspecified atrial fibrillation: Secondary | ICD-10-CM | POA: Diagnosis not present

## 2019-11-26 DIAGNOSIS — E1122 Type 2 diabetes mellitus with diabetic chronic kidney disease: Secondary | ICD-10-CM | POA: Diagnosis not present

## 2019-11-26 DIAGNOSIS — E039 Hypothyroidism, unspecified: Secondary | ICD-10-CM | POA: Diagnosis not present

## 2019-11-26 DIAGNOSIS — R2681 Unsteadiness on feet: Secondary | ICD-10-CM | POA: Diagnosis not present

## 2019-11-26 DIAGNOSIS — M069 Rheumatoid arthritis, unspecified: Secondary | ICD-10-CM | POA: Diagnosis not present

## 2019-11-26 DIAGNOSIS — K219 Gastro-esophageal reflux disease without esophagitis: Secondary | ICD-10-CM | POA: Diagnosis not present

## 2019-11-29 DIAGNOSIS — E039 Hypothyroidism, unspecified: Secondary | ICD-10-CM | POA: Diagnosis not present

## 2019-11-29 DIAGNOSIS — E119 Type 2 diabetes mellitus without complications: Secondary | ICD-10-CM | POA: Diagnosis not present

## 2019-11-29 DIAGNOSIS — L859 Epidermal thickening, unspecified: Secondary | ICD-10-CM | POA: Diagnosis not present

## 2019-11-29 DIAGNOSIS — E7849 Other hyperlipidemia: Secondary | ICD-10-CM | POA: Diagnosis not present

## 2019-11-29 DIAGNOSIS — E1142 Type 2 diabetes mellitus with diabetic polyneuropathy: Secondary | ICD-10-CM | POA: Diagnosis not present

## 2019-11-29 DIAGNOSIS — F329 Major depressive disorder, single episode, unspecified: Secondary | ICD-10-CM | POA: Diagnosis not present

## 2019-11-29 DIAGNOSIS — L602 Onychogryphosis: Secondary | ICD-10-CM | POA: Diagnosis not present

## 2019-11-29 DIAGNOSIS — F419 Anxiety disorder, unspecified: Secondary | ICD-10-CM | POA: Diagnosis not present

## 2019-11-29 DIAGNOSIS — I5032 Chronic diastolic (congestive) heart failure: Secondary | ICD-10-CM | POA: Diagnosis not present

## 2019-11-29 DIAGNOSIS — F0390 Unspecified dementia without behavioral disturbance: Secondary | ICD-10-CM | POA: Diagnosis not present

## 2019-11-29 DIAGNOSIS — L89892 Pressure ulcer of other site, stage 2: Secondary | ICD-10-CM | POA: Diagnosis not present

## 2019-11-29 DIAGNOSIS — I10 Essential (primary) hypertension: Secondary | ICD-10-CM | POA: Diagnosis not present

## 2019-12-02 DIAGNOSIS — Z20822 Contact with and (suspected) exposure to covid-19: Secondary | ICD-10-CM | POA: Diagnosis not present

## 2019-12-03 DIAGNOSIS — G4700 Insomnia, unspecified: Secondary | ICD-10-CM | POA: Diagnosis not present

## 2019-12-03 DIAGNOSIS — F331 Major depressive disorder, recurrent, moderate: Secondary | ICD-10-CM | POA: Diagnosis not present

## 2019-12-03 DIAGNOSIS — F419 Anxiety disorder, unspecified: Secondary | ICD-10-CM | POA: Diagnosis not present

## 2019-12-03 DIAGNOSIS — F0281 Dementia in other diseases classified elsewhere with behavioral disturbance: Secondary | ICD-10-CM | POA: Diagnosis not present

## 2019-12-04 DIAGNOSIS — F329 Major depressive disorder, single episode, unspecified: Secondary | ICD-10-CM | POA: Diagnosis not present

## 2019-12-04 DIAGNOSIS — D6489 Other specified anemias: Secondary | ICD-10-CM | POA: Diagnosis not present

## 2019-12-04 DIAGNOSIS — E119 Type 2 diabetes mellitus without complications: Secondary | ICD-10-CM | POA: Diagnosis not present

## 2019-12-04 DIAGNOSIS — L89892 Pressure ulcer of other site, stage 2: Secondary | ICD-10-CM | POA: Diagnosis not present

## 2019-12-04 DIAGNOSIS — F419 Anxiety disorder, unspecified: Secondary | ICD-10-CM | POA: Diagnosis not present

## 2019-12-04 DIAGNOSIS — I10 Essential (primary) hypertension: Secondary | ICD-10-CM | POA: Diagnosis not present

## 2019-12-04 DIAGNOSIS — E7849 Other hyperlipidemia: Secondary | ICD-10-CM | POA: Diagnosis not present

## 2019-12-04 DIAGNOSIS — F0390 Unspecified dementia without behavioral disturbance: Secondary | ICD-10-CM | POA: Diagnosis not present

## 2019-12-09 DIAGNOSIS — Z20828 Contact with and (suspected) exposure to other viral communicable diseases: Secondary | ICD-10-CM | POA: Diagnosis not present

## 2019-12-11 DIAGNOSIS — N184 Chronic kidney disease, stage 4 (severe): Secondary | ICD-10-CM | POA: Diagnosis not present

## 2019-12-11 DIAGNOSIS — K219 Gastro-esophageal reflux disease without esophagitis: Secondary | ICD-10-CM | POA: Diagnosis not present

## 2019-12-11 DIAGNOSIS — E1122 Type 2 diabetes mellitus with diabetic chronic kidney disease: Secondary | ICD-10-CM | POA: Diagnosis not present

## 2019-12-11 DIAGNOSIS — E039 Hypothyroidism, unspecified: Secondary | ICD-10-CM | POA: Diagnosis not present

## 2019-12-11 DIAGNOSIS — I4891 Unspecified atrial fibrillation: Secondary | ICD-10-CM | POA: Diagnosis not present

## 2019-12-11 DIAGNOSIS — R2689 Other abnormalities of gait and mobility: Secondary | ICD-10-CM | POA: Diagnosis not present

## 2019-12-11 DIAGNOSIS — D631 Anemia in chronic kidney disease: Secondary | ICD-10-CM | POA: Diagnosis not present

## 2019-12-11 DIAGNOSIS — I10 Essential (primary) hypertension: Secondary | ICD-10-CM | POA: Diagnosis not present

## 2019-12-11 DIAGNOSIS — M069 Rheumatoid arthritis, unspecified: Secondary | ICD-10-CM | POA: Diagnosis not present

## 2019-12-12 DIAGNOSIS — K219 Gastro-esophageal reflux disease without esophagitis: Secondary | ICD-10-CM | POA: Diagnosis not present

## 2019-12-12 DIAGNOSIS — I10 Essential (primary) hypertension: Secondary | ICD-10-CM | POA: Diagnosis not present

## 2019-12-12 DIAGNOSIS — M069 Rheumatoid arthritis, unspecified: Secondary | ICD-10-CM | POA: Diagnosis not present

## 2019-12-12 DIAGNOSIS — I4891 Unspecified atrial fibrillation: Secondary | ICD-10-CM | POA: Diagnosis not present

## 2019-12-12 DIAGNOSIS — R2689 Other abnormalities of gait and mobility: Secondary | ICD-10-CM | POA: Diagnosis not present

## 2019-12-12 DIAGNOSIS — N184 Chronic kidney disease, stage 4 (severe): Secondary | ICD-10-CM | POA: Diagnosis not present

## 2019-12-12 DIAGNOSIS — D631 Anemia in chronic kidney disease: Secondary | ICD-10-CM | POA: Diagnosis not present

## 2019-12-12 DIAGNOSIS — E1122 Type 2 diabetes mellitus with diabetic chronic kidney disease: Secondary | ICD-10-CM | POA: Diagnosis not present

## 2019-12-12 DIAGNOSIS — E039 Hypothyroidism, unspecified: Secondary | ICD-10-CM | POA: Diagnosis not present

## 2019-12-13 DIAGNOSIS — D631 Anemia in chronic kidney disease: Secondary | ICD-10-CM | POA: Diagnosis not present

## 2019-12-13 DIAGNOSIS — I4891 Unspecified atrial fibrillation: Secondary | ICD-10-CM | POA: Diagnosis not present

## 2019-12-13 DIAGNOSIS — R2689 Other abnormalities of gait and mobility: Secondary | ICD-10-CM | POA: Diagnosis not present

## 2019-12-13 DIAGNOSIS — E1122 Type 2 diabetes mellitus with diabetic chronic kidney disease: Secondary | ICD-10-CM | POA: Diagnosis not present

## 2019-12-13 DIAGNOSIS — E039 Hypothyroidism, unspecified: Secondary | ICD-10-CM | POA: Diagnosis not present

## 2019-12-13 DIAGNOSIS — I10 Essential (primary) hypertension: Secondary | ICD-10-CM | POA: Diagnosis not present

## 2019-12-13 DIAGNOSIS — M069 Rheumatoid arthritis, unspecified: Secondary | ICD-10-CM | POA: Diagnosis not present

## 2019-12-13 DIAGNOSIS — K219 Gastro-esophageal reflux disease without esophagitis: Secondary | ICD-10-CM | POA: Diagnosis not present

## 2019-12-13 DIAGNOSIS — N184 Chronic kidney disease, stage 4 (severe): Secondary | ICD-10-CM | POA: Diagnosis not present

## 2019-12-14 DIAGNOSIS — F0390 Unspecified dementia without behavioral disturbance: Secondary | ICD-10-CM | POA: Diagnosis not present

## 2019-12-14 DIAGNOSIS — F419 Anxiety disorder, unspecified: Secondary | ICD-10-CM | POA: Diagnosis not present

## 2019-12-14 DIAGNOSIS — I482 Chronic atrial fibrillation, unspecified: Secondary | ICD-10-CM | POA: Diagnosis not present

## 2019-12-14 DIAGNOSIS — F329 Major depressive disorder, single episode, unspecified: Secondary | ICD-10-CM | POA: Diagnosis not present

## 2019-12-14 DIAGNOSIS — I5032 Chronic diastolic (congestive) heart failure: Secondary | ICD-10-CM | POA: Diagnosis not present

## 2019-12-16 DIAGNOSIS — N184 Chronic kidney disease, stage 4 (severe): Secondary | ICD-10-CM | POA: Diagnosis not present

## 2019-12-16 DIAGNOSIS — R2689 Other abnormalities of gait and mobility: Secondary | ICD-10-CM | POA: Diagnosis not present

## 2019-12-16 DIAGNOSIS — I10 Essential (primary) hypertension: Secondary | ICD-10-CM | POA: Diagnosis not present

## 2019-12-16 DIAGNOSIS — K219 Gastro-esophageal reflux disease without esophagitis: Secondary | ICD-10-CM | POA: Diagnosis not present

## 2019-12-16 DIAGNOSIS — E039 Hypothyroidism, unspecified: Secondary | ICD-10-CM | POA: Diagnosis not present

## 2019-12-16 DIAGNOSIS — E1122 Type 2 diabetes mellitus with diabetic chronic kidney disease: Secondary | ICD-10-CM | POA: Diagnosis not present

## 2019-12-16 DIAGNOSIS — I4891 Unspecified atrial fibrillation: Secondary | ICD-10-CM | POA: Diagnosis not present

## 2019-12-16 DIAGNOSIS — M069 Rheumatoid arthritis, unspecified: Secondary | ICD-10-CM | POA: Diagnosis not present

## 2019-12-16 DIAGNOSIS — D631 Anemia in chronic kidney disease: Secondary | ICD-10-CM | POA: Diagnosis not present

## 2019-12-17 DIAGNOSIS — E039 Hypothyroidism, unspecified: Secondary | ICD-10-CM | POA: Diagnosis not present

## 2019-12-17 DIAGNOSIS — M069 Rheumatoid arthritis, unspecified: Secondary | ICD-10-CM | POA: Diagnosis not present

## 2019-12-17 DIAGNOSIS — F0390 Unspecified dementia without behavioral disturbance: Secondary | ICD-10-CM | POA: Diagnosis not present

## 2019-12-17 DIAGNOSIS — I4891 Unspecified atrial fibrillation: Secondary | ICD-10-CM | POA: Diagnosis not present

## 2019-12-17 DIAGNOSIS — D631 Anemia in chronic kidney disease: Secondary | ICD-10-CM | POA: Diagnosis not present

## 2019-12-17 DIAGNOSIS — R300 Dysuria: Secondary | ICD-10-CM | POA: Diagnosis not present

## 2019-12-17 DIAGNOSIS — E7849 Other hyperlipidemia: Secondary | ICD-10-CM | POA: Diagnosis not present

## 2019-12-17 DIAGNOSIS — R2689 Other abnormalities of gait and mobility: Secondary | ICD-10-CM | POA: Diagnosis not present

## 2019-12-17 DIAGNOSIS — F419 Anxiety disorder, unspecified: Secondary | ICD-10-CM | POA: Diagnosis not present

## 2019-12-17 DIAGNOSIS — Z20828 Contact with and (suspected) exposure to other viral communicable diseases: Secondary | ICD-10-CM | POA: Diagnosis not present

## 2019-12-17 DIAGNOSIS — E119 Type 2 diabetes mellitus without complications: Secondary | ICD-10-CM | POA: Diagnosis not present

## 2019-12-17 DIAGNOSIS — E1122 Type 2 diabetes mellitus with diabetic chronic kidney disease: Secondary | ICD-10-CM | POA: Diagnosis not present

## 2019-12-17 DIAGNOSIS — N184 Chronic kidney disease, stage 4 (severe): Secondary | ICD-10-CM | POA: Diagnosis not present

## 2019-12-17 DIAGNOSIS — K219 Gastro-esophageal reflux disease without esophagitis: Secondary | ICD-10-CM | POA: Diagnosis not present

## 2019-12-17 DIAGNOSIS — F329 Major depressive disorder, single episode, unspecified: Secondary | ICD-10-CM | POA: Diagnosis not present

## 2019-12-17 DIAGNOSIS — D6489 Other specified anemias: Secondary | ICD-10-CM | POA: Diagnosis not present

## 2019-12-17 DIAGNOSIS — I10 Essential (primary) hypertension: Secondary | ICD-10-CM | POA: Diagnosis not present

## 2019-12-18 DIAGNOSIS — I10 Essential (primary) hypertension: Secondary | ICD-10-CM | POA: Diagnosis not present

## 2019-12-18 DIAGNOSIS — K219 Gastro-esophageal reflux disease without esophagitis: Secondary | ICD-10-CM | POA: Diagnosis not present

## 2019-12-18 DIAGNOSIS — D631 Anemia in chronic kidney disease: Secondary | ICD-10-CM | POA: Diagnosis not present

## 2019-12-18 DIAGNOSIS — I4891 Unspecified atrial fibrillation: Secondary | ICD-10-CM | POA: Diagnosis not present

## 2019-12-18 DIAGNOSIS — M069 Rheumatoid arthritis, unspecified: Secondary | ICD-10-CM | POA: Diagnosis not present

## 2019-12-18 DIAGNOSIS — E1122 Type 2 diabetes mellitus with diabetic chronic kidney disease: Secondary | ICD-10-CM | POA: Diagnosis not present

## 2019-12-18 DIAGNOSIS — N184 Chronic kidney disease, stage 4 (severe): Secondary | ICD-10-CM | POA: Diagnosis not present

## 2019-12-18 DIAGNOSIS — R2689 Other abnormalities of gait and mobility: Secondary | ICD-10-CM | POA: Diagnosis not present

## 2019-12-18 DIAGNOSIS — E039 Hypothyroidism, unspecified: Secondary | ICD-10-CM | POA: Diagnosis not present

## 2019-12-19 DIAGNOSIS — I4891 Unspecified atrial fibrillation: Secondary | ICD-10-CM | POA: Diagnosis not present

## 2019-12-19 DIAGNOSIS — F0390 Unspecified dementia without behavioral disturbance: Secondary | ICD-10-CM | POA: Diagnosis not present

## 2019-12-19 DIAGNOSIS — K219 Gastro-esophageal reflux disease without esophagitis: Secondary | ICD-10-CM | POA: Diagnosis not present

## 2019-12-19 DIAGNOSIS — E1122 Type 2 diabetes mellitus with diabetic chronic kidney disease: Secondary | ICD-10-CM | POA: Diagnosis not present

## 2019-12-19 DIAGNOSIS — I10 Essential (primary) hypertension: Secondary | ICD-10-CM | POA: Diagnosis not present

## 2019-12-19 DIAGNOSIS — R300 Dysuria: Secondary | ICD-10-CM | POA: Diagnosis not present

## 2019-12-19 DIAGNOSIS — R2689 Other abnormalities of gait and mobility: Secondary | ICD-10-CM | POA: Diagnosis not present

## 2019-12-19 DIAGNOSIS — D6489 Other specified anemias: Secondary | ICD-10-CM | POA: Diagnosis not present

## 2019-12-19 DIAGNOSIS — D631 Anemia in chronic kidney disease: Secondary | ICD-10-CM | POA: Diagnosis not present

## 2019-12-19 DIAGNOSIS — N390 Urinary tract infection, site not specified: Secondary | ICD-10-CM | POA: Diagnosis not present

## 2019-12-19 DIAGNOSIS — M069 Rheumatoid arthritis, unspecified: Secondary | ICD-10-CM | POA: Diagnosis not present

## 2019-12-19 DIAGNOSIS — F419 Anxiety disorder, unspecified: Secondary | ICD-10-CM | POA: Diagnosis not present

## 2019-12-19 DIAGNOSIS — E039 Hypothyroidism, unspecified: Secondary | ICD-10-CM | POA: Diagnosis not present

## 2019-12-19 DIAGNOSIS — F329 Major depressive disorder, single episode, unspecified: Secondary | ICD-10-CM | POA: Diagnosis not present

## 2019-12-19 DIAGNOSIS — N184 Chronic kidney disease, stage 4 (severe): Secondary | ICD-10-CM | POA: Diagnosis not present

## 2019-12-20 DIAGNOSIS — I4891 Unspecified atrial fibrillation: Secondary | ICD-10-CM | POA: Diagnosis not present

## 2019-12-20 DIAGNOSIS — M069 Rheumatoid arthritis, unspecified: Secondary | ICD-10-CM | POA: Diagnosis not present

## 2019-12-20 DIAGNOSIS — E039 Hypothyroidism, unspecified: Secondary | ICD-10-CM | POA: Diagnosis not present

## 2019-12-20 DIAGNOSIS — R2689 Other abnormalities of gait and mobility: Secondary | ICD-10-CM | POA: Diagnosis not present

## 2019-12-20 DIAGNOSIS — E1122 Type 2 diabetes mellitus with diabetic chronic kidney disease: Secondary | ICD-10-CM | POA: Diagnosis not present

## 2019-12-20 DIAGNOSIS — N184 Chronic kidney disease, stage 4 (severe): Secondary | ICD-10-CM | POA: Diagnosis not present

## 2019-12-20 DIAGNOSIS — I10 Essential (primary) hypertension: Secondary | ICD-10-CM | POA: Diagnosis not present

## 2019-12-20 DIAGNOSIS — K219 Gastro-esophageal reflux disease without esophagitis: Secondary | ICD-10-CM | POA: Diagnosis not present

## 2019-12-20 DIAGNOSIS — D631 Anemia in chronic kidney disease: Secondary | ICD-10-CM | POA: Diagnosis not present

## 2019-12-21 DIAGNOSIS — D649 Anemia, unspecified: Secondary | ICD-10-CM | POA: Diagnosis not present

## 2019-12-23 DIAGNOSIS — E1122 Type 2 diabetes mellitus with diabetic chronic kidney disease: Secondary | ICD-10-CM | POA: Diagnosis not present

## 2019-12-23 DIAGNOSIS — M069 Rheumatoid arthritis, unspecified: Secondary | ICD-10-CM | POA: Diagnosis not present

## 2019-12-23 DIAGNOSIS — R2689 Other abnormalities of gait and mobility: Secondary | ICD-10-CM | POA: Diagnosis not present

## 2019-12-23 DIAGNOSIS — I10 Essential (primary) hypertension: Secondary | ICD-10-CM | POA: Diagnosis not present

## 2019-12-23 DIAGNOSIS — D631 Anemia in chronic kidney disease: Secondary | ICD-10-CM | POA: Diagnosis not present

## 2019-12-23 DIAGNOSIS — K219 Gastro-esophageal reflux disease without esophagitis: Secondary | ICD-10-CM | POA: Diagnosis not present

## 2019-12-23 DIAGNOSIS — E039 Hypothyroidism, unspecified: Secondary | ICD-10-CM | POA: Diagnosis not present

## 2019-12-23 DIAGNOSIS — N184 Chronic kidney disease, stage 4 (severe): Secondary | ICD-10-CM | POA: Diagnosis not present

## 2019-12-23 DIAGNOSIS — I4891 Unspecified atrial fibrillation: Secondary | ICD-10-CM | POA: Diagnosis not present

## 2019-12-24 DIAGNOSIS — I10 Essential (primary) hypertension: Secondary | ICD-10-CM | POA: Diagnosis not present

## 2019-12-24 DIAGNOSIS — K219 Gastro-esophageal reflux disease without esophagitis: Secondary | ICD-10-CM | POA: Diagnosis not present

## 2019-12-24 DIAGNOSIS — N184 Chronic kidney disease, stage 4 (severe): Secondary | ICD-10-CM | POA: Diagnosis not present

## 2019-12-24 DIAGNOSIS — D631 Anemia in chronic kidney disease: Secondary | ICD-10-CM | POA: Diagnosis not present

## 2019-12-24 DIAGNOSIS — E039 Hypothyroidism, unspecified: Secondary | ICD-10-CM | POA: Diagnosis not present

## 2019-12-24 DIAGNOSIS — I4891 Unspecified atrial fibrillation: Secondary | ICD-10-CM | POA: Diagnosis not present

## 2019-12-24 DIAGNOSIS — M069 Rheumatoid arthritis, unspecified: Secondary | ICD-10-CM | POA: Diagnosis not present

## 2019-12-24 DIAGNOSIS — R2689 Other abnormalities of gait and mobility: Secondary | ICD-10-CM | POA: Diagnosis not present

## 2019-12-24 DIAGNOSIS — E1122 Type 2 diabetes mellitus with diabetic chronic kidney disease: Secondary | ICD-10-CM | POA: Diagnosis not present

## 2019-12-25 DIAGNOSIS — N189 Chronic kidney disease, unspecified: Secondary | ICD-10-CM | POA: Diagnosis not present

## 2019-12-25 DIAGNOSIS — F0390 Unspecified dementia without behavioral disturbance: Secondary | ICD-10-CM | POA: Diagnosis not present

## 2019-12-25 DIAGNOSIS — I10 Essential (primary) hypertension: Secondary | ICD-10-CM | POA: Diagnosis not present

## 2019-12-25 DIAGNOSIS — F419 Anxiety disorder, unspecified: Secondary | ICD-10-CM | POA: Diagnosis not present

## 2019-12-25 DIAGNOSIS — I4891 Unspecified atrial fibrillation: Secondary | ICD-10-CM | POA: Diagnosis not present

## 2019-12-25 DIAGNOSIS — N184 Chronic kidney disease, stage 4 (severe): Secondary | ICD-10-CM | POA: Diagnosis not present

## 2019-12-25 DIAGNOSIS — E039 Hypothyroidism, unspecified: Secondary | ICD-10-CM | POA: Diagnosis not present

## 2019-12-25 DIAGNOSIS — R2689 Other abnormalities of gait and mobility: Secondary | ICD-10-CM | POA: Diagnosis not present

## 2019-12-25 DIAGNOSIS — M069 Rheumatoid arthritis, unspecified: Secondary | ICD-10-CM | POA: Diagnosis not present

## 2019-12-25 DIAGNOSIS — D631 Anemia in chronic kidney disease: Secondary | ICD-10-CM | POA: Diagnosis not present

## 2019-12-25 DIAGNOSIS — E7849 Other hyperlipidemia: Secondary | ICD-10-CM | POA: Diagnosis not present

## 2019-12-25 DIAGNOSIS — F329 Major depressive disorder, single episode, unspecified: Secondary | ICD-10-CM | POA: Diagnosis not present

## 2019-12-25 DIAGNOSIS — K219 Gastro-esophageal reflux disease without esophagitis: Secondary | ICD-10-CM | POA: Diagnosis not present

## 2019-12-25 DIAGNOSIS — E1122 Type 2 diabetes mellitus with diabetic chronic kidney disease: Secondary | ICD-10-CM | POA: Diagnosis not present

## 2019-12-25 DIAGNOSIS — E119 Type 2 diabetes mellitus without complications: Secondary | ICD-10-CM | POA: Diagnosis not present

## 2019-12-26 DIAGNOSIS — E039 Hypothyroidism, unspecified: Secondary | ICD-10-CM | POA: Diagnosis not present

## 2019-12-26 DIAGNOSIS — D631 Anemia in chronic kidney disease: Secondary | ICD-10-CM | POA: Diagnosis not present

## 2019-12-26 DIAGNOSIS — I10 Essential (primary) hypertension: Secondary | ICD-10-CM | POA: Diagnosis not present

## 2019-12-26 DIAGNOSIS — K219 Gastro-esophageal reflux disease without esophagitis: Secondary | ICD-10-CM | POA: Diagnosis not present

## 2019-12-26 DIAGNOSIS — E1122 Type 2 diabetes mellitus with diabetic chronic kidney disease: Secondary | ICD-10-CM | POA: Diagnosis not present

## 2019-12-26 DIAGNOSIS — R2689 Other abnormalities of gait and mobility: Secondary | ICD-10-CM | POA: Diagnosis not present

## 2019-12-26 DIAGNOSIS — N184 Chronic kidney disease, stage 4 (severe): Secondary | ICD-10-CM | POA: Diagnosis not present

## 2019-12-26 DIAGNOSIS — Z20828 Contact with and (suspected) exposure to other viral communicable diseases: Secondary | ICD-10-CM | POA: Diagnosis not present

## 2019-12-26 DIAGNOSIS — M069 Rheumatoid arthritis, unspecified: Secondary | ICD-10-CM | POA: Diagnosis not present

## 2019-12-26 DIAGNOSIS — I4891 Unspecified atrial fibrillation: Secondary | ICD-10-CM | POA: Diagnosis not present

## 2019-12-27 DIAGNOSIS — E1122 Type 2 diabetes mellitus with diabetic chronic kidney disease: Secondary | ICD-10-CM | POA: Diagnosis not present

## 2019-12-27 DIAGNOSIS — R2689 Other abnormalities of gait and mobility: Secondary | ICD-10-CM | POA: Diagnosis not present

## 2019-12-27 DIAGNOSIS — N184 Chronic kidney disease, stage 4 (severe): Secondary | ICD-10-CM | POA: Diagnosis not present

## 2019-12-27 DIAGNOSIS — K219 Gastro-esophageal reflux disease without esophagitis: Secondary | ICD-10-CM | POA: Diagnosis not present

## 2019-12-27 DIAGNOSIS — D631 Anemia in chronic kidney disease: Secondary | ICD-10-CM | POA: Diagnosis not present

## 2019-12-27 DIAGNOSIS — M069 Rheumatoid arthritis, unspecified: Secondary | ICD-10-CM | POA: Diagnosis not present

## 2019-12-27 DIAGNOSIS — I4891 Unspecified atrial fibrillation: Secondary | ICD-10-CM | POA: Diagnosis not present

## 2019-12-27 DIAGNOSIS — E039 Hypothyroidism, unspecified: Secondary | ICD-10-CM | POA: Diagnosis not present

## 2019-12-27 DIAGNOSIS — I10 Essential (primary) hypertension: Secondary | ICD-10-CM | POA: Diagnosis not present

## 2019-12-31 DIAGNOSIS — F419 Anxiety disorder, unspecified: Secondary | ICD-10-CM | POA: Diagnosis not present

## 2019-12-31 DIAGNOSIS — N184 Chronic kidney disease, stage 4 (severe): Secondary | ICD-10-CM | POA: Diagnosis not present

## 2019-12-31 DIAGNOSIS — D631 Anemia in chronic kidney disease: Secondary | ICD-10-CM | POA: Diagnosis not present

## 2019-12-31 DIAGNOSIS — G4700 Insomnia, unspecified: Secondary | ICD-10-CM | POA: Diagnosis not present

## 2019-12-31 DIAGNOSIS — I4891 Unspecified atrial fibrillation: Secondary | ICD-10-CM | POA: Diagnosis not present

## 2019-12-31 DIAGNOSIS — E1122 Type 2 diabetes mellitus with diabetic chronic kidney disease: Secondary | ICD-10-CM | POA: Diagnosis not present

## 2019-12-31 DIAGNOSIS — M069 Rheumatoid arthritis, unspecified: Secondary | ICD-10-CM | POA: Diagnosis not present

## 2019-12-31 DIAGNOSIS — K219 Gastro-esophageal reflux disease without esophagitis: Secondary | ICD-10-CM | POA: Diagnosis not present

## 2019-12-31 DIAGNOSIS — E039 Hypothyroidism, unspecified: Secondary | ICD-10-CM | POA: Diagnosis not present

## 2019-12-31 DIAGNOSIS — F331 Major depressive disorder, recurrent, moderate: Secondary | ICD-10-CM | POA: Diagnosis not present

## 2019-12-31 DIAGNOSIS — F0281 Dementia in other diseases classified elsewhere with behavioral disturbance: Secondary | ICD-10-CM | POA: Diagnosis not present

## 2019-12-31 DIAGNOSIS — R2689 Other abnormalities of gait and mobility: Secondary | ICD-10-CM | POA: Diagnosis not present

## 2019-12-31 DIAGNOSIS — I10 Essential (primary) hypertension: Secondary | ICD-10-CM | POA: Diagnosis not present

## 2020-01-01 DIAGNOSIS — E7849 Other hyperlipidemia: Secondary | ICD-10-CM | POA: Diagnosis not present

## 2020-01-01 DIAGNOSIS — E119 Type 2 diabetes mellitus without complications: Secondary | ICD-10-CM | POA: Diagnosis not present

## 2020-01-01 DIAGNOSIS — I4891 Unspecified atrial fibrillation: Secondary | ICD-10-CM | POA: Diagnosis not present

## 2020-01-01 DIAGNOSIS — K219 Gastro-esophageal reflux disease without esophagitis: Secondary | ICD-10-CM | POA: Diagnosis not present

## 2020-01-01 DIAGNOSIS — F329 Major depressive disorder, single episode, unspecified: Secondary | ICD-10-CM | POA: Diagnosis not present

## 2020-01-01 DIAGNOSIS — M069 Rheumatoid arthritis, unspecified: Secondary | ICD-10-CM | POA: Diagnosis not present

## 2020-01-01 DIAGNOSIS — E039 Hypothyroidism, unspecified: Secondary | ICD-10-CM | POA: Diagnosis not present

## 2020-01-01 DIAGNOSIS — F0390 Unspecified dementia without behavioral disturbance: Secondary | ICD-10-CM | POA: Diagnosis not present

## 2020-01-01 DIAGNOSIS — D6489 Other specified anemias: Secondary | ICD-10-CM | POA: Diagnosis not present

## 2020-01-01 DIAGNOSIS — E1122 Type 2 diabetes mellitus with diabetic chronic kidney disease: Secondary | ICD-10-CM | POA: Diagnosis not present

## 2020-01-01 DIAGNOSIS — F419 Anxiety disorder, unspecified: Secondary | ICD-10-CM | POA: Diagnosis not present

## 2020-01-01 DIAGNOSIS — I10 Essential (primary) hypertension: Secondary | ICD-10-CM | POA: Diagnosis not present

## 2020-01-01 DIAGNOSIS — R2689 Other abnormalities of gait and mobility: Secondary | ICD-10-CM | POA: Diagnosis not present

## 2020-01-01 DIAGNOSIS — N184 Chronic kidney disease, stage 4 (severe): Secondary | ICD-10-CM | POA: Diagnosis not present

## 2020-01-01 DIAGNOSIS — D631 Anemia in chronic kidney disease: Secondary | ICD-10-CM | POA: Diagnosis not present

## 2020-01-02 DIAGNOSIS — K219 Gastro-esophageal reflux disease without esophagitis: Secondary | ICD-10-CM | POA: Diagnosis not present

## 2020-01-02 DIAGNOSIS — I4891 Unspecified atrial fibrillation: Secondary | ICD-10-CM | POA: Diagnosis not present

## 2020-01-02 DIAGNOSIS — D631 Anemia in chronic kidney disease: Secondary | ICD-10-CM | POA: Diagnosis not present

## 2020-01-02 DIAGNOSIS — R2689 Other abnormalities of gait and mobility: Secondary | ICD-10-CM | POA: Diagnosis not present

## 2020-01-02 DIAGNOSIS — E1122 Type 2 diabetes mellitus with diabetic chronic kidney disease: Secondary | ICD-10-CM | POA: Diagnosis not present

## 2020-01-02 DIAGNOSIS — M069 Rheumatoid arthritis, unspecified: Secondary | ICD-10-CM | POA: Diagnosis not present

## 2020-01-02 DIAGNOSIS — E039 Hypothyroidism, unspecified: Secondary | ICD-10-CM | POA: Diagnosis not present

## 2020-01-02 DIAGNOSIS — I10 Essential (primary) hypertension: Secondary | ICD-10-CM | POA: Diagnosis not present

## 2020-01-02 DIAGNOSIS — N184 Chronic kidney disease, stage 4 (severe): Secondary | ICD-10-CM | POA: Diagnosis not present

## 2020-01-03 DIAGNOSIS — N184 Chronic kidney disease, stage 4 (severe): Secondary | ICD-10-CM | POA: Diagnosis not present

## 2020-01-03 DIAGNOSIS — E039 Hypothyroidism, unspecified: Secondary | ICD-10-CM | POA: Diagnosis not present

## 2020-01-03 DIAGNOSIS — I10 Essential (primary) hypertension: Secondary | ICD-10-CM | POA: Diagnosis not present

## 2020-01-03 DIAGNOSIS — D631 Anemia in chronic kidney disease: Secondary | ICD-10-CM | POA: Diagnosis not present

## 2020-01-03 DIAGNOSIS — M069 Rheumatoid arthritis, unspecified: Secondary | ICD-10-CM | POA: Diagnosis not present

## 2020-01-03 DIAGNOSIS — I4891 Unspecified atrial fibrillation: Secondary | ICD-10-CM | POA: Diagnosis not present

## 2020-01-03 DIAGNOSIS — R2689 Other abnormalities of gait and mobility: Secondary | ICD-10-CM | POA: Diagnosis not present

## 2020-01-03 DIAGNOSIS — E1122 Type 2 diabetes mellitus with diabetic chronic kidney disease: Secondary | ICD-10-CM | POA: Diagnosis not present

## 2020-01-03 DIAGNOSIS — K219 Gastro-esophageal reflux disease without esophagitis: Secondary | ICD-10-CM | POA: Diagnosis not present

## 2020-01-04 DIAGNOSIS — F419 Anxiety disorder, unspecified: Secondary | ICD-10-CM | POA: Diagnosis not present

## 2020-01-04 DIAGNOSIS — I5032 Chronic diastolic (congestive) heart failure: Secondary | ICD-10-CM | POA: Diagnosis not present

## 2020-01-04 DIAGNOSIS — I482 Chronic atrial fibrillation, unspecified: Secondary | ICD-10-CM | POA: Diagnosis not present

## 2020-01-04 DIAGNOSIS — F329 Major depressive disorder, single episode, unspecified: Secondary | ICD-10-CM | POA: Diagnosis not present

## 2020-01-04 DIAGNOSIS — F0390 Unspecified dementia without behavioral disturbance: Secondary | ICD-10-CM | POA: Diagnosis not present

## 2020-01-06 DIAGNOSIS — R2689 Other abnormalities of gait and mobility: Secondary | ICD-10-CM | POA: Diagnosis not present

## 2020-01-06 DIAGNOSIS — E1122 Type 2 diabetes mellitus with diabetic chronic kidney disease: Secondary | ICD-10-CM | POA: Diagnosis not present

## 2020-01-06 DIAGNOSIS — M069 Rheumatoid arthritis, unspecified: Secondary | ICD-10-CM | POA: Diagnosis not present

## 2020-01-06 DIAGNOSIS — I10 Essential (primary) hypertension: Secondary | ICD-10-CM | POA: Diagnosis not present

## 2020-01-06 DIAGNOSIS — N184 Chronic kidney disease, stage 4 (severe): Secondary | ICD-10-CM | POA: Diagnosis not present

## 2020-01-06 DIAGNOSIS — E039 Hypothyroidism, unspecified: Secondary | ICD-10-CM | POA: Diagnosis not present

## 2020-01-06 DIAGNOSIS — I4891 Unspecified atrial fibrillation: Secondary | ICD-10-CM | POA: Diagnosis not present

## 2020-01-06 DIAGNOSIS — D631 Anemia in chronic kidney disease: Secondary | ICD-10-CM | POA: Diagnosis not present

## 2020-01-06 DIAGNOSIS — K219 Gastro-esophageal reflux disease without esophagitis: Secondary | ICD-10-CM | POA: Diagnosis not present

## 2020-01-07 DIAGNOSIS — E1122 Type 2 diabetes mellitus with diabetic chronic kidney disease: Secondary | ICD-10-CM | POA: Diagnosis not present

## 2020-01-07 DIAGNOSIS — K219 Gastro-esophageal reflux disease without esophagitis: Secondary | ICD-10-CM | POA: Diagnosis not present

## 2020-01-07 DIAGNOSIS — M069 Rheumatoid arthritis, unspecified: Secondary | ICD-10-CM | POA: Diagnosis not present

## 2020-01-07 DIAGNOSIS — D631 Anemia in chronic kidney disease: Secondary | ICD-10-CM | POA: Diagnosis not present

## 2020-01-07 DIAGNOSIS — I4891 Unspecified atrial fibrillation: Secondary | ICD-10-CM | POA: Diagnosis not present

## 2020-01-07 DIAGNOSIS — E039 Hypothyroidism, unspecified: Secondary | ICD-10-CM | POA: Diagnosis not present

## 2020-01-07 DIAGNOSIS — N184 Chronic kidney disease, stage 4 (severe): Secondary | ICD-10-CM | POA: Diagnosis not present

## 2020-01-07 DIAGNOSIS — I10 Essential (primary) hypertension: Secondary | ICD-10-CM | POA: Diagnosis not present

## 2020-01-07 DIAGNOSIS — R2689 Other abnormalities of gait and mobility: Secondary | ICD-10-CM | POA: Diagnosis not present

## 2020-01-08 DIAGNOSIS — F419 Anxiety disorder, unspecified: Secondary | ICD-10-CM | POA: Diagnosis not present

## 2020-01-08 DIAGNOSIS — I482 Chronic atrial fibrillation, unspecified: Secondary | ICD-10-CM | POA: Diagnosis not present

## 2020-01-08 DIAGNOSIS — N184 Chronic kidney disease, stage 4 (severe): Secondary | ICD-10-CM | POA: Diagnosis not present

## 2020-01-08 DIAGNOSIS — I10 Essential (primary) hypertension: Secondary | ICD-10-CM | POA: Diagnosis not present

## 2020-01-08 DIAGNOSIS — R2689 Other abnormalities of gait and mobility: Secondary | ICD-10-CM | POA: Diagnosis not present

## 2020-01-08 DIAGNOSIS — E1122 Type 2 diabetes mellitus with diabetic chronic kidney disease: Secondary | ICD-10-CM | POA: Diagnosis not present

## 2020-01-08 DIAGNOSIS — M069 Rheumatoid arthritis, unspecified: Secondary | ICD-10-CM | POA: Diagnosis not present

## 2020-01-08 DIAGNOSIS — D631 Anemia in chronic kidney disease: Secondary | ICD-10-CM | POA: Diagnosis not present

## 2020-01-08 DIAGNOSIS — I4891 Unspecified atrial fibrillation: Secondary | ICD-10-CM | POA: Diagnosis not present

## 2020-01-08 DIAGNOSIS — F0390 Unspecified dementia without behavioral disturbance: Secondary | ICD-10-CM | POA: Diagnosis not present

## 2020-01-08 DIAGNOSIS — K219 Gastro-esophageal reflux disease without esophagitis: Secondary | ICD-10-CM | POA: Diagnosis not present

## 2020-01-08 DIAGNOSIS — E119 Type 2 diabetes mellitus without complications: Secondary | ICD-10-CM | POA: Diagnosis not present

## 2020-01-08 DIAGNOSIS — F329 Major depressive disorder, single episode, unspecified: Secondary | ICD-10-CM | POA: Diagnosis not present

## 2020-01-08 DIAGNOSIS — E039 Hypothyroidism, unspecified: Secondary | ICD-10-CM | POA: Diagnosis not present

## 2020-01-08 DIAGNOSIS — I5032 Chronic diastolic (congestive) heart failure: Secondary | ICD-10-CM | POA: Diagnosis not present

## 2020-01-09 DIAGNOSIS — E1122 Type 2 diabetes mellitus with diabetic chronic kidney disease: Secondary | ICD-10-CM | POA: Diagnosis not present

## 2020-01-09 DIAGNOSIS — R2689 Other abnormalities of gait and mobility: Secondary | ICD-10-CM | POA: Diagnosis not present

## 2020-01-09 DIAGNOSIS — I4891 Unspecified atrial fibrillation: Secondary | ICD-10-CM | POA: Diagnosis not present

## 2020-01-09 DIAGNOSIS — K219 Gastro-esophageal reflux disease without esophagitis: Secondary | ICD-10-CM | POA: Diagnosis not present

## 2020-01-09 DIAGNOSIS — I10 Essential (primary) hypertension: Secondary | ICD-10-CM | POA: Diagnosis not present

## 2020-01-09 DIAGNOSIS — E039 Hypothyroidism, unspecified: Secondary | ICD-10-CM | POA: Diagnosis not present

## 2020-01-09 DIAGNOSIS — M069 Rheumatoid arthritis, unspecified: Secondary | ICD-10-CM | POA: Diagnosis not present

## 2020-01-09 DIAGNOSIS — D631 Anemia in chronic kidney disease: Secondary | ICD-10-CM | POA: Diagnosis not present

## 2020-01-09 DIAGNOSIS — N184 Chronic kidney disease, stage 4 (severe): Secondary | ICD-10-CM | POA: Diagnosis not present

## 2020-01-10 DIAGNOSIS — M069 Rheumatoid arthritis, unspecified: Secondary | ICD-10-CM | POA: Diagnosis not present

## 2020-01-10 DIAGNOSIS — E1122 Type 2 diabetes mellitus with diabetic chronic kidney disease: Secondary | ICD-10-CM | POA: Diagnosis not present

## 2020-01-10 DIAGNOSIS — R2689 Other abnormalities of gait and mobility: Secondary | ICD-10-CM | POA: Diagnosis not present

## 2020-01-10 DIAGNOSIS — I4891 Unspecified atrial fibrillation: Secondary | ICD-10-CM | POA: Diagnosis not present

## 2020-01-10 DIAGNOSIS — M6281 Muscle weakness (generalized): Secondary | ICD-10-CM | POA: Diagnosis not present

## 2020-01-10 DIAGNOSIS — I10 Essential (primary) hypertension: Secondary | ICD-10-CM | POA: Diagnosis not present

## 2020-01-10 DIAGNOSIS — N184 Chronic kidney disease, stage 4 (severe): Secondary | ICD-10-CM | POA: Diagnosis not present

## 2020-01-10 DIAGNOSIS — F0150 Vascular dementia without behavioral disturbance: Secondary | ICD-10-CM | POA: Diagnosis not present

## 2020-01-10 DIAGNOSIS — K219 Gastro-esophageal reflux disease without esophagitis: Secondary | ICD-10-CM | POA: Diagnosis not present

## 2020-01-13 DIAGNOSIS — M6281 Muscle weakness (generalized): Secondary | ICD-10-CM | POA: Diagnosis not present

## 2020-01-13 DIAGNOSIS — F0150 Vascular dementia without behavioral disturbance: Secondary | ICD-10-CM | POA: Diagnosis not present

## 2020-01-13 DIAGNOSIS — E1122 Type 2 diabetes mellitus with diabetic chronic kidney disease: Secondary | ICD-10-CM | POA: Diagnosis not present

## 2020-01-13 DIAGNOSIS — I4891 Unspecified atrial fibrillation: Secondary | ICD-10-CM | POA: Diagnosis not present

## 2020-01-13 DIAGNOSIS — R2689 Other abnormalities of gait and mobility: Secondary | ICD-10-CM | POA: Diagnosis not present

## 2020-01-13 DIAGNOSIS — M069 Rheumatoid arthritis, unspecified: Secondary | ICD-10-CM | POA: Diagnosis not present

## 2020-01-13 DIAGNOSIS — I10 Essential (primary) hypertension: Secondary | ICD-10-CM | POA: Diagnosis not present

## 2020-01-13 DIAGNOSIS — K219 Gastro-esophageal reflux disease without esophagitis: Secondary | ICD-10-CM | POA: Diagnosis not present

## 2020-01-13 DIAGNOSIS — N184 Chronic kidney disease, stage 4 (severe): Secondary | ICD-10-CM | POA: Diagnosis not present

## 2020-01-14 DIAGNOSIS — I10 Essential (primary) hypertension: Secondary | ICD-10-CM | POA: Diagnosis not present

## 2020-01-14 DIAGNOSIS — I4891 Unspecified atrial fibrillation: Secondary | ICD-10-CM | POA: Diagnosis not present

## 2020-01-14 DIAGNOSIS — M069 Rheumatoid arthritis, unspecified: Secondary | ICD-10-CM | POA: Diagnosis not present

## 2020-01-14 DIAGNOSIS — F0150 Vascular dementia without behavioral disturbance: Secondary | ICD-10-CM | POA: Diagnosis not present

## 2020-01-14 DIAGNOSIS — M6281 Muscle weakness (generalized): Secondary | ICD-10-CM | POA: Diagnosis not present

## 2020-01-14 DIAGNOSIS — R2689 Other abnormalities of gait and mobility: Secondary | ICD-10-CM | POA: Diagnosis not present

## 2020-01-14 DIAGNOSIS — E1122 Type 2 diabetes mellitus with diabetic chronic kidney disease: Secondary | ICD-10-CM | POA: Diagnosis not present

## 2020-01-14 DIAGNOSIS — N184 Chronic kidney disease, stage 4 (severe): Secondary | ICD-10-CM | POA: Diagnosis not present

## 2020-01-14 DIAGNOSIS — K219 Gastro-esophageal reflux disease without esophagitis: Secondary | ICD-10-CM | POA: Diagnosis not present

## 2020-01-15 DIAGNOSIS — E1122 Type 2 diabetes mellitus with diabetic chronic kidney disease: Secondary | ICD-10-CM | POA: Diagnosis not present

## 2020-01-15 DIAGNOSIS — K219 Gastro-esophageal reflux disease without esophagitis: Secondary | ICD-10-CM | POA: Diagnosis not present

## 2020-01-15 DIAGNOSIS — R2689 Other abnormalities of gait and mobility: Secondary | ICD-10-CM | POA: Diagnosis not present

## 2020-01-15 DIAGNOSIS — I10 Essential (primary) hypertension: Secondary | ICD-10-CM | POA: Diagnosis not present

## 2020-01-15 DIAGNOSIS — I4891 Unspecified atrial fibrillation: Secondary | ICD-10-CM | POA: Diagnosis not present

## 2020-01-15 DIAGNOSIS — M6281 Muscle weakness (generalized): Secondary | ICD-10-CM | POA: Diagnosis not present

## 2020-01-15 DIAGNOSIS — F0150 Vascular dementia without behavioral disturbance: Secondary | ICD-10-CM | POA: Diagnosis not present

## 2020-01-15 DIAGNOSIS — N184 Chronic kidney disease, stage 4 (severe): Secondary | ICD-10-CM | POA: Diagnosis not present

## 2020-01-15 DIAGNOSIS — M069 Rheumatoid arthritis, unspecified: Secondary | ICD-10-CM | POA: Diagnosis not present

## 2020-01-16 DIAGNOSIS — K219 Gastro-esophageal reflux disease without esophagitis: Secondary | ICD-10-CM | POA: Diagnosis not present

## 2020-01-16 DIAGNOSIS — I4891 Unspecified atrial fibrillation: Secondary | ICD-10-CM | POA: Diagnosis not present

## 2020-01-16 DIAGNOSIS — R2689 Other abnormalities of gait and mobility: Secondary | ICD-10-CM | POA: Diagnosis not present

## 2020-01-16 DIAGNOSIS — M069 Rheumatoid arthritis, unspecified: Secondary | ICD-10-CM | POA: Diagnosis not present

## 2020-01-16 DIAGNOSIS — I10 Essential (primary) hypertension: Secondary | ICD-10-CM | POA: Diagnosis not present

## 2020-01-16 DIAGNOSIS — M6281 Muscle weakness (generalized): Secondary | ICD-10-CM | POA: Diagnosis not present

## 2020-01-16 DIAGNOSIS — N184 Chronic kidney disease, stage 4 (severe): Secondary | ICD-10-CM | POA: Diagnosis not present

## 2020-01-16 DIAGNOSIS — F0150 Vascular dementia without behavioral disturbance: Secondary | ICD-10-CM | POA: Diagnosis not present

## 2020-01-16 DIAGNOSIS — E1122 Type 2 diabetes mellitus with diabetic chronic kidney disease: Secondary | ICD-10-CM | POA: Diagnosis not present

## 2020-01-17 DIAGNOSIS — E1122 Type 2 diabetes mellitus with diabetic chronic kidney disease: Secondary | ICD-10-CM | POA: Diagnosis not present

## 2020-01-17 DIAGNOSIS — M069 Rheumatoid arthritis, unspecified: Secondary | ICD-10-CM | POA: Diagnosis not present

## 2020-01-17 DIAGNOSIS — I10 Essential (primary) hypertension: Secondary | ICD-10-CM | POA: Diagnosis not present

## 2020-01-17 DIAGNOSIS — N184 Chronic kidney disease, stage 4 (severe): Secondary | ICD-10-CM | POA: Diagnosis not present

## 2020-01-17 DIAGNOSIS — F0150 Vascular dementia without behavioral disturbance: Secondary | ICD-10-CM | POA: Diagnosis not present

## 2020-01-17 DIAGNOSIS — I4891 Unspecified atrial fibrillation: Secondary | ICD-10-CM | POA: Diagnosis not present

## 2020-01-17 DIAGNOSIS — K219 Gastro-esophageal reflux disease without esophagitis: Secondary | ICD-10-CM | POA: Diagnosis not present

## 2020-01-17 DIAGNOSIS — R2689 Other abnormalities of gait and mobility: Secondary | ICD-10-CM | POA: Diagnosis not present

## 2020-01-17 DIAGNOSIS — M6281 Muscle weakness (generalized): Secondary | ICD-10-CM | POA: Diagnosis not present

## 2020-01-20 DIAGNOSIS — N184 Chronic kidney disease, stage 4 (severe): Secondary | ICD-10-CM | POA: Diagnosis not present

## 2020-01-20 DIAGNOSIS — I4891 Unspecified atrial fibrillation: Secondary | ICD-10-CM | POA: Diagnosis not present

## 2020-01-20 DIAGNOSIS — F0150 Vascular dementia without behavioral disturbance: Secondary | ICD-10-CM | POA: Diagnosis not present

## 2020-01-20 DIAGNOSIS — E1122 Type 2 diabetes mellitus with diabetic chronic kidney disease: Secondary | ICD-10-CM | POA: Diagnosis not present

## 2020-01-20 DIAGNOSIS — M6281 Muscle weakness (generalized): Secondary | ICD-10-CM | POA: Diagnosis not present

## 2020-01-20 DIAGNOSIS — R2689 Other abnormalities of gait and mobility: Secondary | ICD-10-CM | POA: Diagnosis not present

## 2020-01-20 DIAGNOSIS — K219 Gastro-esophageal reflux disease without esophagitis: Secondary | ICD-10-CM | POA: Diagnosis not present

## 2020-01-20 DIAGNOSIS — M069 Rheumatoid arthritis, unspecified: Secondary | ICD-10-CM | POA: Diagnosis not present

## 2020-01-20 DIAGNOSIS — I10 Essential (primary) hypertension: Secondary | ICD-10-CM | POA: Diagnosis not present

## 2020-01-21 DIAGNOSIS — I4891 Unspecified atrial fibrillation: Secondary | ICD-10-CM | POA: Diagnosis not present

## 2020-01-21 DIAGNOSIS — E119 Type 2 diabetes mellitus without complications: Secondary | ICD-10-CM | POA: Diagnosis not present

## 2020-01-21 DIAGNOSIS — I482 Chronic atrial fibrillation, unspecified: Secondary | ICD-10-CM | POA: Diagnosis not present

## 2020-01-21 DIAGNOSIS — E7849 Other hyperlipidemia: Secondary | ICD-10-CM | POA: Diagnosis not present

## 2020-01-21 DIAGNOSIS — E1122 Type 2 diabetes mellitus with diabetic chronic kidney disease: Secondary | ICD-10-CM | POA: Diagnosis not present

## 2020-01-21 DIAGNOSIS — R2689 Other abnormalities of gait and mobility: Secondary | ICD-10-CM | POA: Diagnosis not present

## 2020-01-21 DIAGNOSIS — F329 Major depressive disorder, single episode, unspecified: Secondary | ICD-10-CM | POA: Diagnosis not present

## 2020-01-21 DIAGNOSIS — F0150 Vascular dementia without behavioral disturbance: Secondary | ICD-10-CM | POA: Diagnosis not present

## 2020-01-21 DIAGNOSIS — K219 Gastro-esophageal reflux disease without esophagitis: Secondary | ICD-10-CM | POA: Diagnosis not present

## 2020-01-21 DIAGNOSIS — I5032 Chronic diastolic (congestive) heart failure: Secondary | ICD-10-CM | POA: Diagnosis not present

## 2020-01-21 DIAGNOSIS — F0390 Unspecified dementia without behavioral disturbance: Secondary | ICD-10-CM | POA: Diagnosis not present

## 2020-01-21 DIAGNOSIS — I10 Essential (primary) hypertension: Secondary | ICD-10-CM | POA: Diagnosis not present

## 2020-01-21 DIAGNOSIS — F419 Anxiety disorder, unspecified: Secondary | ICD-10-CM | POA: Diagnosis not present

## 2020-01-21 DIAGNOSIS — N184 Chronic kidney disease, stage 4 (severe): Secondary | ICD-10-CM | POA: Diagnosis not present

## 2020-01-21 DIAGNOSIS — D6489 Other specified anemias: Secondary | ICD-10-CM | POA: Diagnosis not present

## 2020-01-21 DIAGNOSIS — M069 Rheumatoid arthritis, unspecified: Secondary | ICD-10-CM | POA: Diagnosis not present

## 2020-01-21 DIAGNOSIS — M6281 Muscle weakness (generalized): Secondary | ICD-10-CM | POA: Diagnosis not present

## 2020-01-22 DIAGNOSIS — N184 Chronic kidney disease, stage 4 (severe): Secondary | ICD-10-CM | POA: Diagnosis not present

## 2020-01-22 DIAGNOSIS — K219 Gastro-esophageal reflux disease without esophagitis: Secondary | ICD-10-CM | POA: Diagnosis not present

## 2020-01-22 DIAGNOSIS — E1122 Type 2 diabetes mellitus with diabetic chronic kidney disease: Secondary | ICD-10-CM | POA: Diagnosis not present

## 2020-01-22 DIAGNOSIS — R2689 Other abnormalities of gait and mobility: Secondary | ICD-10-CM | POA: Diagnosis not present

## 2020-01-22 DIAGNOSIS — I10 Essential (primary) hypertension: Secondary | ICD-10-CM | POA: Diagnosis not present

## 2020-01-22 DIAGNOSIS — M6281 Muscle weakness (generalized): Secondary | ICD-10-CM | POA: Diagnosis not present

## 2020-01-22 DIAGNOSIS — F0150 Vascular dementia without behavioral disturbance: Secondary | ICD-10-CM | POA: Diagnosis not present

## 2020-01-22 DIAGNOSIS — M069 Rheumatoid arthritis, unspecified: Secondary | ICD-10-CM | POA: Diagnosis not present

## 2020-01-22 DIAGNOSIS — I4891 Unspecified atrial fibrillation: Secondary | ICD-10-CM | POA: Diagnosis not present

## 2020-01-26 DIAGNOSIS — N184 Chronic kidney disease, stage 4 (severe): Secondary | ICD-10-CM | POA: Diagnosis not present

## 2020-01-26 DIAGNOSIS — I4891 Unspecified atrial fibrillation: Secondary | ICD-10-CM | POA: Diagnosis not present

## 2020-01-26 DIAGNOSIS — M6281 Muscle weakness (generalized): Secondary | ICD-10-CM | POA: Diagnosis not present

## 2020-01-26 DIAGNOSIS — I10 Essential (primary) hypertension: Secondary | ICD-10-CM | POA: Diagnosis not present

## 2020-01-26 DIAGNOSIS — R2689 Other abnormalities of gait and mobility: Secondary | ICD-10-CM | POA: Diagnosis not present

## 2020-01-26 DIAGNOSIS — E1122 Type 2 diabetes mellitus with diabetic chronic kidney disease: Secondary | ICD-10-CM | POA: Diagnosis not present

## 2020-01-26 DIAGNOSIS — F0150 Vascular dementia without behavioral disturbance: Secondary | ICD-10-CM | POA: Diagnosis not present

## 2020-01-26 DIAGNOSIS — M069 Rheumatoid arthritis, unspecified: Secondary | ICD-10-CM | POA: Diagnosis not present

## 2020-01-26 DIAGNOSIS — K219 Gastro-esophageal reflux disease without esophagitis: Secondary | ICD-10-CM | POA: Diagnosis not present

## 2020-01-27 DIAGNOSIS — Z20828 Contact with and (suspected) exposure to other viral communicable diseases: Secondary | ICD-10-CM | POA: Diagnosis not present

## 2020-01-27 DIAGNOSIS — K219 Gastro-esophageal reflux disease without esophagitis: Secondary | ICD-10-CM | POA: Diagnosis not present

## 2020-01-27 DIAGNOSIS — F0150 Vascular dementia without behavioral disturbance: Secondary | ICD-10-CM | POA: Diagnosis not present

## 2020-01-27 DIAGNOSIS — I10 Essential (primary) hypertension: Secondary | ICD-10-CM | POA: Diagnosis not present

## 2020-01-27 DIAGNOSIS — E1122 Type 2 diabetes mellitus with diabetic chronic kidney disease: Secondary | ICD-10-CM | POA: Diagnosis not present

## 2020-01-27 DIAGNOSIS — R2689 Other abnormalities of gait and mobility: Secondary | ICD-10-CM | POA: Diagnosis not present

## 2020-01-27 DIAGNOSIS — I4891 Unspecified atrial fibrillation: Secondary | ICD-10-CM | POA: Diagnosis not present

## 2020-01-27 DIAGNOSIS — N184 Chronic kidney disease, stage 4 (severe): Secondary | ICD-10-CM | POA: Diagnosis not present

## 2020-01-27 DIAGNOSIS — M6281 Muscle weakness (generalized): Secondary | ICD-10-CM | POA: Diagnosis not present

## 2020-01-27 DIAGNOSIS — M069 Rheumatoid arthritis, unspecified: Secondary | ICD-10-CM | POA: Diagnosis not present

## 2020-01-28 DIAGNOSIS — K219 Gastro-esophageal reflux disease without esophagitis: Secondary | ICD-10-CM | POA: Diagnosis not present

## 2020-01-28 DIAGNOSIS — R2689 Other abnormalities of gait and mobility: Secondary | ICD-10-CM | POA: Diagnosis not present

## 2020-01-28 DIAGNOSIS — N184 Chronic kidney disease, stage 4 (severe): Secondary | ICD-10-CM | POA: Diagnosis not present

## 2020-01-28 DIAGNOSIS — I10 Essential (primary) hypertension: Secondary | ICD-10-CM | POA: Diagnosis not present

## 2020-01-28 DIAGNOSIS — F0150 Vascular dementia without behavioral disturbance: Secondary | ICD-10-CM | POA: Diagnosis not present

## 2020-01-28 DIAGNOSIS — F0281 Dementia in other diseases classified elsewhere with behavioral disturbance: Secondary | ICD-10-CM | POA: Diagnosis not present

## 2020-01-28 DIAGNOSIS — E1122 Type 2 diabetes mellitus with diabetic chronic kidney disease: Secondary | ICD-10-CM | POA: Diagnosis not present

## 2020-01-28 DIAGNOSIS — M6281 Muscle weakness (generalized): Secondary | ICD-10-CM | POA: Diagnosis not present

## 2020-01-28 DIAGNOSIS — F419 Anxiety disorder, unspecified: Secondary | ICD-10-CM | POA: Diagnosis not present

## 2020-01-28 DIAGNOSIS — G4700 Insomnia, unspecified: Secondary | ICD-10-CM | POA: Diagnosis not present

## 2020-01-28 DIAGNOSIS — M069 Rheumatoid arthritis, unspecified: Secondary | ICD-10-CM | POA: Diagnosis not present

## 2020-01-28 DIAGNOSIS — F331 Major depressive disorder, recurrent, moderate: Secondary | ICD-10-CM | POA: Diagnosis not present

## 2020-01-28 DIAGNOSIS — I4891 Unspecified atrial fibrillation: Secondary | ICD-10-CM | POA: Diagnosis not present

## 2020-01-29 DIAGNOSIS — M6281 Muscle weakness (generalized): Secondary | ICD-10-CM | POA: Diagnosis not present

## 2020-01-29 DIAGNOSIS — R2689 Other abnormalities of gait and mobility: Secondary | ICD-10-CM | POA: Diagnosis not present

## 2020-01-29 DIAGNOSIS — M069 Rheumatoid arthritis, unspecified: Secondary | ICD-10-CM | POA: Diagnosis not present

## 2020-01-29 DIAGNOSIS — K219 Gastro-esophageal reflux disease without esophagitis: Secondary | ICD-10-CM | POA: Diagnosis not present

## 2020-01-29 DIAGNOSIS — F0150 Vascular dementia without behavioral disturbance: Secondary | ICD-10-CM | POA: Diagnosis not present

## 2020-01-29 DIAGNOSIS — I4891 Unspecified atrial fibrillation: Secondary | ICD-10-CM | POA: Diagnosis not present

## 2020-01-29 DIAGNOSIS — E1122 Type 2 diabetes mellitus with diabetic chronic kidney disease: Secondary | ICD-10-CM | POA: Diagnosis not present

## 2020-01-29 DIAGNOSIS — N184 Chronic kidney disease, stage 4 (severe): Secondary | ICD-10-CM | POA: Diagnosis not present

## 2020-01-29 DIAGNOSIS — I10 Essential (primary) hypertension: Secondary | ICD-10-CM | POA: Diagnosis not present

## 2020-01-30 DIAGNOSIS — E1122 Type 2 diabetes mellitus with diabetic chronic kidney disease: Secondary | ICD-10-CM | POA: Diagnosis not present

## 2020-01-30 DIAGNOSIS — K219 Gastro-esophageal reflux disease without esophagitis: Secondary | ICD-10-CM | POA: Diagnosis not present

## 2020-01-30 DIAGNOSIS — R2689 Other abnormalities of gait and mobility: Secondary | ICD-10-CM | POA: Diagnosis not present

## 2020-01-30 DIAGNOSIS — I10 Essential (primary) hypertension: Secondary | ICD-10-CM | POA: Diagnosis not present

## 2020-01-30 DIAGNOSIS — M6281 Muscle weakness (generalized): Secondary | ICD-10-CM | POA: Diagnosis not present

## 2020-01-30 DIAGNOSIS — I4891 Unspecified atrial fibrillation: Secondary | ICD-10-CM | POA: Diagnosis not present

## 2020-01-30 DIAGNOSIS — Z20828 Contact with and (suspected) exposure to other viral communicable diseases: Secondary | ICD-10-CM | POA: Diagnosis not present

## 2020-01-30 DIAGNOSIS — F0150 Vascular dementia without behavioral disturbance: Secondary | ICD-10-CM | POA: Diagnosis not present

## 2020-01-30 DIAGNOSIS — N184 Chronic kidney disease, stage 4 (severe): Secondary | ICD-10-CM | POA: Diagnosis not present

## 2020-01-30 DIAGNOSIS — M069 Rheumatoid arthritis, unspecified: Secondary | ICD-10-CM | POA: Diagnosis not present

## 2020-01-31 DIAGNOSIS — K219 Gastro-esophageal reflux disease without esophagitis: Secondary | ICD-10-CM | POA: Diagnosis not present

## 2020-01-31 DIAGNOSIS — I10 Essential (primary) hypertension: Secondary | ICD-10-CM | POA: Diagnosis not present

## 2020-01-31 DIAGNOSIS — R2689 Other abnormalities of gait and mobility: Secondary | ICD-10-CM | POA: Diagnosis not present

## 2020-01-31 DIAGNOSIS — I4891 Unspecified atrial fibrillation: Secondary | ICD-10-CM | POA: Diagnosis not present

## 2020-01-31 DIAGNOSIS — M6281 Muscle weakness (generalized): Secondary | ICD-10-CM | POA: Diagnosis not present

## 2020-01-31 DIAGNOSIS — N184 Chronic kidney disease, stage 4 (severe): Secondary | ICD-10-CM | POA: Diagnosis not present

## 2020-01-31 DIAGNOSIS — F0150 Vascular dementia without behavioral disturbance: Secondary | ICD-10-CM | POA: Diagnosis not present

## 2020-01-31 DIAGNOSIS — M069 Rheumatoid arthritis, unspecified: Secondary | ICD-10-CM | POA: Diagnosis not present

## 2020-01-31 DIAGNOSIS — E1122 Type 2 diabetes mellitus with diabetic chronic kidney disease: Secondary | ICD-10-CM | POA: Diagnosis not present

## 2020-02-03 DIAGNOSIS — E1122 Type 2 diabetes mellitus with diabetic chronic kidney disease: Secondary | ICD-10-CM | POA: Diagnosis not present

## 2020-02-03 DIAGNOSIS — I4891 Unspecified atrial fibrillation: Secondary | ICD-10-CM | POA: Diagnosis not present

## 2020-02-03 DIAGNOSIS — N184 Chronic kidney disease, stage 4 (severe): Secondary | ICD-10-CM | POA: Diagnosis not present

## 2020-02-03 DIAGNOSIS — R2689 Other abnormalities of gait and mobility: Secondary | ICD-10-CM | POA: Diagnosis not present

## 2020-02-03 DIAGNOSIS — Z20828 Contact with and (suspected) exposure to other viral communicable diseases: Secondary | ICD-10-CM | POA: Diagnosis not present

## 2020-02-03 DIAGNOSIS — I10 Essential (primary) hypertension: Secondary | ICD-10-CM | POA: Diagnosis not present

## 2020-02-03 DIAGNOSIS — K219 Gastro-esophageal reflux disease without esophagitis: Secondary | ICD-10-CM | POA: Diagnosis not present

## 2020-02-03 DIAGNOSIS — M6281 Muscle weakness (generalized): Secondary | ICD-10-CM | POA: Diagnosis not present

## 2020-02-03 DIAGNOSIS — M069 Rheumatoid arthritis, unspecified: Secondary | ICD-10-CM | POA: Diagnosis not present

## 2020-02-03 DIAGNOSIS — F0150 Vascular dementia without behavioral disturbance: Secondary | ICD-10-CM | POA: Diagnosis not present

## 2020-02-04 DIAGNOSIS — R2689 Other abnormalities of gait and mobility: Secondary | ICD-10-CM | POA: Diagnosis not present

## 2020-02-04 DIAGNOSIS — N184 Chronic kidney disease, stage 4 (severe): Secondary | ICD-10-CM | POA: Diagnosis not present

## 2020-02-04 DIAGNOSIS — M6281 Muscle weakness (generalized): Secondary | ICD-10-CM | POA: Diagnosis not present

## 2020-02-04 DIAGNOSIS — I4891 Unspecified atrial fibrillation: Secondary | ICD-10-CM | POA: Diagnosis not present

## 2020-02-04 DIAGNOSIS — K219 Gastro-esophageal reflux disease without esophagitis: Secondary | ICD-10-CM | POA: Diagnosis not present

## 2020-02-04 DIAGNOSIS — E1122 Type 2 diabetes mellitus with diabetic chronic kidney disease: Secondary | ICD-10-CM | POA: Diagnosis not present

## 2020-02-04 DIAGNOSIS — I10 Essential (primary) hypertension: Secondary | ICD-10-CM | POA: Diagnosis not present

## 2020-02-04 DIAGNOSIS — F0150 Vascular dementia without behavioral disturbance: Secondary | ICD-10-CM | POA: Diagnosis not present

## 2020-02-04 DIAGNOSIS — M069 Rheumatoid arthritis, unspecified: Secondary | ICD-10-CM | POA: Diagnosis not present

## 2020-02-05 DIAGNOSIS — I10 Essential (primary) hypertension: Secondary | ICD-10-CM | POA: Diagnosis not present

## 2020-02-05 DIAGNOSIS — I4891 Unspecified atrial fibrillation: Secondary | ICD-10-CM | POA: Diagnosis not present

## 2020-02-05 DIAGNOSIS — M069 Rheumatoid arthritis, unspecified: Secondary | ICD-10-CM | POA: Diagnosis not present

## 2020-02-05 DIAGNOSIS — M6281 Muscle weakness (generalized): Secondary | ICD-10-CM | POA: Diagnosis not present

## 2020-02-05 DIAGNOSIS — R2689 Other abnormalities of gait and mobility: Secondary | ICD-10-CM | POA: Diagnosis not present

## 2020-02-05 DIAGNOSIS — N184 Chronic kidney disease, stage 4 (severe): Secondary | ICD-10-CM | POA: Diagnosis not present

## 2020-02-05 DIAGNOSIS — F0150 Vascular dementia without behavioral disturbance: Secondary | ICD-10-CM | POA: Diagnosis not present

## 2020-02-05 DIAGNOSIS — K219 Gastro-esophageal reflux disease without esophagitis: Secondary | ICD-10-CM | POA: Diagnosis not present

## 2020-02-05 DIAGNOSIS — E1122 Type 2 diabetes mellitus with diabetic chronic kidney disease: Secondary | ICD-10-CM | POA: Diagnosis not present

## 2020-02-06 DIAGNOSIS — E1122 Type 2 diabetes mellitus with diabetic chronic kidney disease: Secondary | ICD-10-CM | POA: Diagnosis not present

## 2020-02-06 DIAGNOSIS — I4891 Unspecified atrial fibrillation: Secondary | ICD-10-CM | POA: Diagnosis not present

## 2020-02-06 DIAGNOSIS — I10 Essential (primary) hypertension: Secondary | ICD-10-CM | POA: Diagnosis not present

## 2020-02-06 DIAGNOSIS — F0150 Vascular dementia without behavioral disturbance: Secondary | ICD-10-CM | POA: Diagnosis not present

## 2020-02-06 DIAGNOSIS — M069 Rheumatoid arthritis, unspecified: Secondary | ICD-10-CM | POA: Diagnosis not present

## 2020-02-06 DIAGNOSIS — N184 Chronic kidney disease, stage 4 (severe): Secondary | ICD-10-CM | POA: Diagnosis not present

## 2020-02-06 DIAGNOSIS — R2689 Other abnormalities of gait and mobility: Secondary | ICD-10-CM | POA: Diagnosis not present

## 2020-02-06 DIAGNOSIS — M6281 Muscle weakness (generalized): Secondary | ICD-10-CM | POA: Diagnosis not present

## 2020-02-06 DIAGNOSIS — Z20828 Contact with and (suspected) exposure to other viral communicable diseases: Secondary | ICD-10-CM | POA: Diagnosis not present

## 2020-02-06 DIAGNOSIS — K219 Gastro-esophageal reflux disease without esophagitis: Secondary | ICD-10-CM | POA: Diagnosis not present

## 2020-02-07 DIAGNOSIS — N184 Chronic kidney disease, stage 4 (severe): Secondary | ICD-10-CM | POA: Diagnosis not present

## 2020-02-07 DIAGNOSIS — M069 Rheumatoid arthritis, unspecified: Secondary | ICD-10-CM | POA: Diagnosis not present

## 2020-02-07 DIAGNOSIS — R2689 Other abnormalities of gait and mobility: Secondary | ICD-10-CM | POA: Diagnosis not present

## 2020-02-07 DIAGNOSIS — K219 Gastro-esophageal reflux disease without esophagitis: Secondary | ICD-10-CM | POA: Diagnosis not present

## 2020-02-07 DIAGNOSIS — I4891 Unspecified atrial fibrillation: Secondary | ICD-10-CM | POA: Diagnosis not present

## 2020-02-07 DIAGNOSIS — M6281 Muscle weakness (generalized): Secondary | ICD-10-CM | POA: Diagnosis not present

## 2020-02-07 DIAGNOSIS — I10 Essential (primary) hypertension: Secondary | ICD-10-CM | POA: Diagnosis not present

## 2020-02-07 DIAGNOSIS — F0150 Vascular dementia without behavioral disturbance: Secondary | ICD-10-CM | POA: Diagnosis not present

## 2020-02-07 DIAGNOSIS — E1122 Type 2 diabetes mellitus with diabetic chronic kidney disease: Secondary | ICD-10-CM | POA: Diagnosis not present

## 2020-02-09 DIAGNOSIS — Z20828 Contact with and (suspected) exposure to other viral communicable diseases: Secondary | ICD-10-CM | POA: Diagnosis not present

## 2020-02-10 DIAGNOSIS — M6281 Muscle weakness (generalized): Secondary | ICD-10-CM | POA: Diagnosis not present

## 2020-02-10 DIAGNOSIS — E1122 Type 2 diabetes mellitus with diabetic chronic kidney disease: Secondary | ICD-10-CM | POA: Diagnosis not present

## 2020-02-10 DIAGNOSIS — K219 Gastro-esophageal reflux disease without esophagitis: Secondary | ICD-10-CM | POA: Diagnosis not present

## 2020-02-10 DIAGNOSIS — I10 Essential (primary) hypertension: Secondary | ICD-10-CM | POA: Diagnosis not present

## 2020-02-10 DIAGNOSIS — F0150 Vascular dementia without behavioral disturbance: Secondary | ICD-10-CM | POA: Diagnosis not present

## 2020-02-10 DIAGNOSIS — I4891 Unspecified atrial fibrillation: Secondary | ICD-10-CM | POA: Diagnosis not present

## 2020-02-10 DIAGNOSIS — M069 Rheumatoid arthritis, unspecified: Secondary | ICD-10-CM | POA: Diagnosis not present

## 2020-02-10 DIAGNOSIS — R2689 Other abnormalities of gait and mobility: Secondary | ICD-10-CM | POA: Diagnosis not present

## 2020-02-10 DIAGNOSIS — N184 Chronic kidney disease, stage 4 (severe): Secondary | ICD-10-CM | POA: Diagnosis not present

## 2020-02-11 DIAGNOSIS — M069 Rheumatoid arthritis, unspecified: Secondary | ICD-10-CM | POA: Diagnosis not present

## 2020-02-11 DIAGNOSIS — K219 Gastro-esophageal reflux disease without esophagitis: Secondary | ICD-10-CM | POA: Diagnosis not present

## 2020-02-11 DIAGNOSIS — E1122 Type 2 diabetes mellitus with diabetic chronic kidney disease: Secondary | ICD-10-CM | POA: Diagnosis not present

## 2020-02-11 DIAGNOSIS — I10 Essential (primary) hypertension: Secondary | ICD-10-CM | POA: Diagnosis not present

## 2020-02-11 DIAGNOSIS — R2689 Other abnormalities of gait and mobility: Secondary | ICD-10-CM | POA: Diagnosis not present

## 2020-02-11 DIAGNOSIS — F0150 Vascular dementia without behavioral disturbance: Secondary | ICD-10-CM | POA: Diagnosis not present

## 2020-02-11 DIAGNOSIS — M6281 Muscle weakness (generalized): Secondary | ICD-10-CM | POA: Diagnosis not present

## 2020-02-11 DIAGNOSIS — I4891 Unspecified atrial fibrillation: Secondary | ICD-10-CM | POA: Diagnosis not present

## 2020-02-11 DIAGNOSIS — N184 Chronic kidney disease, stage 4 (severe): Secondary | ICD-10-CM | POA: Diagnosis not present

## 2020-02-12 DIAGNOSIS — R2689 Other abnormalities of gait and mobility: Secondary | ICD-10-CM | POA: Diagnosis not present

## 2020-02-12 DIAGNOSIS — E1122 Type 2 diabetes mellitus with diabetic chronic kidney disease: Secondary | ICD-10-CM | POA: Diagnosis not present

## 2020-02-12 DIAGNOSIS — K219 Gastro-esophageal reflux disease without esophagitis: Secondary | ICD-10-CM | POA: Diagnosis not present

## 2020-02-12 DIAGNOSIS — F0150 Vascular dementia without behavioral disturbance: Secondary | ICD-10-CM | POA: Diagnosis not present

## 2020-02-12 DIAGNOSIS — N184 Chronic kidney disease, stage 4 (severe): Secondary | ICD-10-CM | POA: Diagnosis not present

## 2020-02-12 DIAGNOSIS — I10 Essential (primary) hypertension: Secondary | ICD-10-CM | POA: Diagnosis not present

## 2020-02-12 DIAGNOSIS — I4891 Unspecified atrial fibrillation: Secondary | ICD-10-CM | POA: Diagnosis not present

## 2020-02-12 DIAGNOSIS — M069 Rheumatoid arthritis, unspecified: Secondary | ICD-10-CM | POA: Diagnosis not present

## 2020-02-12 DIAGNOSIS — M6281 Muscle weakness (generalized): Secondary | ICD-10-CM | POA: Diagnosis not present

## 2020-02-13 DIAGNOSIS — I4891 Unspecified atrial fibrillation: Secondary | ICD-10-CM | POA: Diagnosis not present

## 2020-02-13 DIAGNOSIS — M069 Rheumatoid arthritis, unspecified: Secondary | ICD-10-CM | POA: Diagnosis not present

## 2020-02-13 DIAGNOSIS — E1122 Type 2 diabetes mellitus with diabetic chronic kidney disease: Secondary | ICD-10-CM | POA: Diagnosis not present

## 2020-02-13 DIAGNOSIS — F0150 Vascular dementia without behavioral disturbance: Secondary | ICD-10-CM | POA: Diagnosis not present

## 2020-02-13 DIAGNOSIS — N184 Chronic kidney disease, stage 4 (severe): Secondary | ICD-10-CM | POA: Diagnosis not present

## 2020-02-13 DIAGNOSIS — I10 Essential (primary) hypertension: Secondary | ICD-10-CM | POA: Diagnosis not present

## 2020-02-13 DIAGNOSIS — K219 Gastro-esophageal reflux disease without esophagitis: Secondary | ICD-10-CM | POA: Diagnosis not present

## 2020-02-13 DIAGNOSIS — R2689 Other abnormalities of gait and mobility: Secondary | ICD-10-CM | POA: Diagnosis not present

## 2020-02-13 DIAGNOSIS — M6281 Muscle weakness (generalized): Secondary | ICD-10-CM | POA: Diagnosis not present

## 2020-02-15 DIAGNOSIS — I482 Chronic atrial fibrillation, unspecified: Secondary | ICD-10-CM | POA: Diagnosis not present

## 2020-02-15 DIAGNOSIS — F419 Anxiety disorder, unspecified: Secondary | ICD-10-CM | POA: Diagnosis not present

## 2020-02-15 DIAGNOSIS — L89892 Pressure ulcer of other site, stage 2: Secondary | ICD-10-CM | POA: Diagnosis not present

## 2020-02-15 DIAGNOSIS — I5032 Chronic diastolic (congestive) heart failure: Secondary | ICD-10-CM | POA: Diagnosis not present

## 2020-02-15 DIAGNOSIS — F329 Major depressive disorder, single episode, unspecified: Secondary | ICD-10-CM | POA: Diagnosis not present

## 2020-02-15 DIAGNOSIS — F0390 Unspecified dementia without behavioral disturbance: Secondary | ICD-10-CM | POA: Diagnosis not present

## 2020-02-26 DIAGNOSIS — F419 Anxiety disorder, unspecified: Secondary | ICD-10-CM | POA: Diagnosis not present

## 2020-02-26 DIAGNOSIS — F0281 Dementia in other diseases classified elsewhere with behavioral disturbance: Secondary | ICD-10-CM | POA: Diagnosis not present

## 2020-02-26 DIAGNOSIS — F331 Major depressive disorder, recurrent, moderate: Secondary | ICD-10-CM | POA: Diagnosis not present

## 2020-02-26 DIAGNOSIS — G4700 Insomnia, unspecified: Secondary | ICD-10-CM | POA: Diagnosis not present

## 2020-03-03 DIAGNOSIS — E1142 Type 2 diabetes mellitus with diabetic polyneuropathy: Secondary | ICD-10-CM | POA: Diagnosis not present

## 2020-03-03 DIAGNOSIS — L602 Onychogryphosis: Secondary | ICD-10-CM | POA: Diagnosis not present

## 2020-03-03 DIAGNOSIS — E11621 Type 2 diabetes mellitus with foot ulcer: Secondary | ICD-10-CM | POA: Diagnosis not present

## 2020-03-03 DIAGNOSIS — L97412 Non-pressure chronic ulcer of right heel and midfoot with fat layer exposed: Secondary | ICD-10-CM | POA: Diagnosis not present

## 2020-03-04 DIAGNOSIS — N183 Chronic kidney disease, stage 3 unspecified: Secondary | ICD-10-CM | POA: Diagnosis not present

## 2020-03-04 DIAGNOSIS — I4891 Unspecified atrial fibrillation: Secondary | ICD-10-CM | POA: Diagnosis not present

## 2020-03-04 DIAGNOSIS — M069 Rheumatoid arthritis, unspecified: Secondary | ICD-10-CM | POA: Diagnosis not present

## 2020-03-04 DIAGNOSIS — R4701 Aphasia: Secondary | ICD-10-CM | POA: Diagnosis not present

## 2020-03-04 DIAGNOSIS — F411 Generalized anxiety disorder: Secondary | ICD-10-CM | POA: Diagnosis not present

## 2020-03-04 DIAGNOSIS — I10 Essential (primary) hypertension: Secondary | ICD-10-CM | POA: Diagnosis not present

## 2020-03-07 DIAGNOSIS — I5032 Chronic diastolic (congestive) heart failure: Secondary | ICD-10-CM | POA: Diagnosis not present

## 2020-03-07 DIAGNOSIS — F329 Major depressive disorder, single episode, unspecified: Secondary | ICD-10-CM | POA: Diagnosis not present

## 2020-03-07 DIAGNOSIS — F419 Anxiety disorder, unspecified: Secondary | ICD-10-CM | POA: Diagnosis not present

## 2020-03-07 DIAGNOSIS — F0390 Unspecified dementia without behavioral disturbance: Secondary | ICD-10-CM | POA: Diagnosis not present

## 2020-03-07 DIAGNOSIS — I482 Chronic atrial fibrillation, unspecified: Secondary | ICD-10-CM | POA: Diagnosis not present

## 2020-03-12 DIAGNOSIS — L97412 Non-pressure chronic ulcer of right heel and midfoot with fat layer exposed: Secondary | ICD-10-CM | POA: Diagnosis not present

## 2020-03-12 DIAGNOSIS — E11621 Type 2 diabetes mellitus with foot ulcer: Secondary | ICD-10-CM | POA: Diagnosis not present

## 2020-03-12 DIAGNOSIS — E1142 Type 2 diabetes mellitus with diabetic polyneuropathy: Secondary | ICD-10-CM | POA: Diagnosis not present

## 2020-03-14 DIAGNOSIS — I5032 Chronic diastolic (congestive) heart failure: Secondary | ICD-10-CM | POA: Diagnosis not present

## 2020-03-14 DIAGNOSIS — F419 Anxiety disorder, unspecified: Secondary | ICD-10-CM | POA: Diagnosis not present

## 2020-03-14 DIAGNOSIS — F329 Major depressive disorder, single episode, unspecified: Secondary | ICD-10-CM | POA: Diagnosis not present

## 2020-03-14 DIAGNOSIS — F0390 Unspecified dementia without behavioral disturbance: Secondary | ICD-10-CM | POA: Diagnosis not present

## 2020-03-14 DIAGNOSIS — I482 Chronic atrial fibrillation, unspecified: Secondary | ICD-10-CM | POA: Diagnosis not present

## 2020-03-16 DIAGNOSIS — Z20822 Contact with and (suspected) exposure to covid-19: Secondary | ICD-10-CM | POA: Diagnosis not present

## 2020-03-19 DIAGNOSIS — Z20822 Contact with and (suspected) exposure to covid-19: Secondary | ICD-10-CM | POA: Diagnosis not present

## 2020-03-24 DIAGNOSIS — F0281 Dementia in other diseases classified elsewhere with behavioral disturbance: Secondary | ICD-10-CM | POA: Diagnosis not present

## 2020-03-24 DIAGNOSIS — G4700 Insomnia, unspecified: Secondary | ICD-10-CM | POA: Diagnosis not present

## 2020-03-24 DIAGNOSIS — F419 Anxiety disorder, unspecified: Secondary | ICD-10-CM | POA: Diagnosis not present

## 2020-03-24 DIAGNOSIS — F331 Major depressive disorder, recurrent, moderate: Secondary | ICD-10-CM | POA: Diagnosis not present

## 2020-04-07 DIAGNOSIS — E11621 Type 2 diabetes mellitus with foot ulcer: Secondary | ICD-10-CM | POA: Diagnosis not present

## 2020-04-07 DIAGNOSIS — L97522 Non-pressure chronic ulcer of other part of left foot with fat layer exposed: Secondary | ICD-10-CM | POA: Diagnosis not present

## 2020-04-07 DIAGNOSIS — L02611 Cutaneous abscess of right foot: Secondary | ICD-10-CM | POA: Diagnosis not present

## 2020-04-07 DIAGNOSIS — E1142 Type 2 diabetes mellitus with diabetic polyneuropathy: Secondary | ICD-10-CM | POA: Diagnosis not present

## 2020-04-09 DIAGNOSIS — F329 Major depressive disorder, single episode, unspecified: Secondary | ICD-10-CM | POA: Diagnosis not present

## 2020-04-09 DIAGNOSIS — F419 Anxiety disorder, unspecified: Secondary | ICD-10-CM | POA: Diagnosis not present

## 2020-04-09 DIAGNOSIS — I482 Chronic atrial fibrillation, unspecified: Secondary | ICD-10-CM | POA: Diagnosis not present

## 2020-04-09 DIAGNOSIS — F0390 Unspecified dementia without behavioral disturbance: Secondary | ICD-10-CM | POA: Diagnosis not present

## 2020-04-09 DIAGNOSIS — I5032 Chronic diastolic (congestive) heart failure: Secondary | ICD-10-CM | POA: Diagnosis not present

## 2020-04-21 DIAGNOSIS — F0281 Dementia in other diseases classified elsewhere with behavioral disturbance: Secondary | ICD-10-CM | POA: Diagnosis not present

## 2020-04-21 DIAGNOSIS — F331 Major depressive disorder, recurrent, moderate: Secondary | ICD-10-CM | POA: Diagnosis not present

## 2020-04-21 DIAGNOSIS — G4700 Insomnia, unspecified: Secondary | ICD-10-CM | POA: Diagnosis not present

## 2020-04-21 DIAGNOSIS — F419 Anxiety disorder, unspecified: Secondary | ICD-10-CM | POA: Diagnosis not present

## 2020-04-22 DIAGNOSIS — R4701 Aphasia: Secondary | ICD-10-CM | POA: Diagnosis not present

## 2020-04-22 DIAGNOSIS — I4891 Unspecified atrial fibrillation: Secondary | ICD-10-CM | POA: Diagnosis not present

## 2020-04-22 DIAGNOSIS — I10 Essential (primary) hypertension: Secondary | ICD-10-CM | POA: Diagnosis not present

## 2020-04-22 DIAGNOSIS — N183 Chronic kidney disease, stage 3 unspecified: Secondary | ICD-10-CM | POA: Diagnosis not present

## 2020-04-22 DIAGNOSIS — F411 Generalized anxiety disorder: Secondary | ICD-10-CM | POA: Diagnosis not present

## 2020-04-22 DIAGNOSIS — M069 Rheumatoid arthritis, unspecified: Secondary | ICD-10-CM | POA: Diagnosis not present

## 2020-04-23 DIAGNOSIS — N390 Urinary tract infection, site not specified: Secondary | ICD-10-CM | POA: Diagnosis not present

## 2020-04-23 DIAGNOSIS — E08621 Diabetes mellitus due to underlying condition with foot ulcer: Secondary | ICD-10-CM | POA: Diagnosis not present

## 2020-04-23 DIAGNOSIS — L97422 Non-pressure chronic ulcer of left heel and midfoot with fat layer exposed: Secondary | ICD-10-CM | POA: Diagnosis not present

## 2020-04-23 DIAGNOSIS — L97522 Non-pressure chronic ulcer of other part of left foot with fat layer exposed: Secondary | ICD-10-CM | POA: Diagnosis not present

## 2020-04-23 DIAGNOSIS — E11621 Type 2 diabetes mellitus with foot ulcer: Secondary | ICD-10-CM | POA: Diagnosis not present

## 2020-04-30 DIAGNOSIS — E11621 Type 2 diabetes mellitus with foot ulcer: Secondary | ICD-10-CM | POA: Diagnosis not present

## 2020-04-30 DIAGNOSIS — L97529 Non-pressure chronic ulcer of other part of left foot with unspecified severity: Secondary | ICD-10-CM | POA: Diagnosis not present

## 2020-04-30 DIAGNOSIS — L89892 Pressure ulcer of other site, stage 2: Secondary | ICD-10-CM | POA: Diagnosis not present

## 2020-05-05 DIAGNOSIS — E785 Hyperlipidemia, unspecified: Secondary | ICD-10-CM | POA: Diagnosis not present

## 2020-05-05 DIAGNOSIS — E119 Type 2 diabetes mellitus without complications: Secondary | ICD-10-CM | POA: Diagnosis not present

## 2020-05-08 DIAGNOSIS — M069 Rheumatoid arthritis, unspecified: Secondary | ICD-10-CM | POA: Diagnosis not present

## 2020-05-08 DIAGNOSIS — F0150 Vascular dementia without behavioral disturbance: Secondary | ICD-10-CM | POA: Diagnosis not present

## 2020-05-08 DIAGNOSIS — I10 Essential (primary) hypertension: Secondary | ICD-10-CM | POA: Diagnosis not present

## 2020-05-08 DIAGNOSIS — E1122 Type 2 diabetes mellitus with diabetic chronic kidney disease: Secondary | ICD-10-CM | POA: Diagnosis not present

## 2020-05-08 DIAGNOSIS — N184 Chronic kidney disease, stage 4 (severe): Secondary | ICD-10-CM | POA: Diagnosis not present

## 2020-05-08 DIAGNOSIS — I4891 Unspecified atrial fibrillation: Secondary | ICD-10-CM | POA: Diagnosis not present

## 2020-05-08 DIAGNOSIS — K219 Gastro-esophageal reflux disease without esophagitis: Secondary | ICD-10-CM | POA: Diagnosis not present

## 2020-05-08 DIAGNOSIS — R2689 Other abnormalities of gait and mobility: Secondary | ICD-10-CM | POA: Diagnosis not present

## 2020-05-08 DIAGNOSIS — M6281 Muscle weakness (generalized): Secondary | ICD-10-CM | POA: Diagnosis not present

## 2020-05-11 DIAGNOSIS — F0390 Unspecified dementia without behavioral disturbance: Secondary | ICD-10-CM | POA: Diagnosis not present

## 2020-05-11 DIAGNOSIS — R2689 Other abnormalities of gait and mobility: Secondary | ICD-10-CM | POA: Diagnosis not present

## 2020-05-11 DIAGNOSIS — N3020 Other chronic cystitis without hematuria: Secondary | ICD-10-CM | POA: Diagnosis not present

## 2020-05-11 DIAGNOSIS — F0150 Vascular dementia without behavioral disturbance: Secondary | ICD-10-CM | POA: Diagnosis not present

## 2020-05-11 DIAGNOSIS — F419 Anxiety disorder, unspecified: Secondary | ICD-10-CM | POA: Diagnosis not present

## 2020-05-11 DIAGNOSIS — N390 Urinary tract infection, site not specified: Secondary | ICD-10-CM | POA: Diagnosis not present

## 2020-05-11 DIAGNOSIS — K219 Gastro-esophageal reflux disease without esophagitis: Secondary | ICD-10-CM | POA: Diagnosis not present

## 2020-05-11 DIAGNOSIS — M6281 Muscle weakness (generalized): Secondary | ICD-10-CM | POA: Diagnosis not present

## 2020-05-11 DIAGNOSIS — M069 Rheumatoid arthritis, unspecified: Secondary | ICD-10-CM | POA: Diagnosis not present

## 2020-05-11 DIAGNOSIS — E1122 Type 2 diabetes mellitus with diabetic chronic kidney disease: Secondary | ICD-10-CM | POA: Diagnosis not present

## 2020-05-11 DIAGNOSIS — I5032 Chronic diastolic (congestive) heart failure: Secondary | ICD-10-CM | POA: Diagnosis not present

## 2020-05-11 DIAGNOSIS — L89892 Pressure ulcer of other site, stage 2: Secondary | ICD-10-CM | POA: Diagnosis not present

## 2020-05-11 DIAGNOSIS — N184 Chronic kidney disease, stage 4 (severe): Secondary | ICD-10-CM | POA: Diagnosis not present

## 2020-05-11 DIAGNOSIS — F329 Major depressive disorder, single episode, unspecified: Secondary | ICD-10-CM | POA: Diagnosis not present

## 2020-05-11 DIAGNOSIS — I10 Essential (primary) hypertension: Secondary | ICD-10-CM | POA: Diagnosis not present

## 2020-05-11 DIAGNOSIS — I4891 Unspecified atrial fibrillation: Secondary | ICD-10-CM | POA: Diagnosis not present

## 2020-05-12 DIAGNOSIS — M069 Rheumatoid arthritis, unspecified: Secondary | ICD-10-CM | POA: Diagnosis not present

## 2020-05-12 DIAGNOSIS — I10 Essential (primary) hypertension: Secondary | ICD-10-CM | POA: Diagnosis not present

## 2020-05-12 DIAGNOSIS — R2689 Other abnormalities of gait and mobility: Secondary | ICD-10-CM | POA: Diagnosis not present

## 2020-05-12 DIAGNOSIS — K219 Gastro-esophageal reflux disease without esophagitis: Secondary | ICD-10-CM | POA: Diagnosis not present

## 2020-05-12 DIAGNOSIS — M6281 Muscle weakness (generalized): Secondary | ICD-10-CM | POA: Diagnosis not present

## 2020-05-12 DIAGNOSIS — F0150 Vascular dementia without behavioral disturbance: Secondary | ICD-10-CM | POA: Diagnosis not present

## 2020-05-12 DIAGNOSIS — N184 Chronic kidney disease, stage 4 (severe): Secondary | ICD-10-CM | POA: Diagnosis not present

## 2020-05-12 DIAGNOSIS — E1122 Type 2 diabetes mellitus with diabetic chronic kidney disease: Secondary | ICD-10-CM | POA: Diagnosis not present

## 2020-05-12 DIAGNOSIS — I4891 Unspecified atrial fibrillation: Secondary | ICD-10-CM | POA: Diagnosis not present

## 2020-05-12 DIAGNOSIS — R0989 Other specified symptoms and signs involving the circulatory and respiratory systems: Secondary | ICD-10-CM | POA: Diagnosis not present

## 2020-05-13 ENCOUNTER — Other Ambulatory Visit: Payer: Self-pay

## 2020-05-13 ENCOUNTER — Observation Stay (HOSPITAL_COMMUNITY): Payer: Medicare PPO

## 2020-05-13 ENCOUNTER — Observation Stay (HOSPITAL_COMMUNITY)
Admission: EM | Admit: 2020-05-13 | Discharge: 2020-05-15 | Disposition: A | Discharge status: Home / Self Care | Payer: Medicare PPO | Attending: Internal Medicine | Admitting: Internal Medicine

## 2020-05-13 ENCOUNTER — Emergency Department (HOSPITAL_COMMUNITY): Payer: Medicare PPO

## 2020-05-13 ENCOUNTER — Observation Stay (HOSPITAL_BASED_OUTPATIENT_CLINIC_OR_DEPARTMENT_OTHER): Payer: Medicare PPO

## 2020-05-13 ENCOUNTER — Encounter (HOSPITAL_COMMUNITY): Payer: Self-pay | Admitting: *Deleted

## 2020-05-13 DIAGNOSIS — I5032 Chronic diastolic (congestive) heart failure: Secondary | ICD-10-CM | POA: Diagnosis not present

## 2020-05-13 DIAGNOSIS — M069 Rheumatoid arthritis, unspecified: Secondary | ICD-10-CM | POA: Diagnosis present

## 2020-05-13 DIAGNOSIS — R9082 White matter disease, unspecified: Secondary | ICD-10-CM | POA: Insufficient documentation

## 2020-05-13 DIAGNOSIS — I720 Aneurysm of carotid artery: Secondary | ICD-10-CM | POA: Diagnosis not present

## 2020-05-13 DIAGNOSIS — G459 Transient cerebral ischemic attack, unspecified: Principal | ICD-10-CM | POA: Insufficient documentation

## 2020-05-13 DIAGNOSIS — Z96653 Presence of artificial knee joint, bilateral: Secondary | ICD-10-CM | POA: Insufficient documentation

## 2020-05-13 DIAGNOSIS — E119 Type 2 diabetes mellitus without complications: Secondary | ICD-10-CM | POA: Insufficient documentation

## 2020-05-13 DIAGNOSIS — E1165 Type 2 diabetes mellitus with hyperglycemia: Secondary | ICD-10-CM | POA: Diagnosis not present

## 2020-05-13 DIAGNOSIS — I083 Combined rheumatic disorders of mitral, aortic and tricuspid valves: Secondary | ICD-10-CM | POA: Insufficient documentation

## 2020-05-13 DIAGNOSIS — Z79899 Other long term (current) drug therapy: Secondary | ICD-10-CM | POA: Insufficient documentation

## 2020-05-13 DIAGNOSIS — I671 Cerebral aneurysm, nonruptured: Secondary | ICD-10-CM | POA: Diagnosis not present

## 2020-05-13 DIAGNOSIS — I10 Essential (primary) hypertension: Secondary | ICD-10-CM | POA: Diagnosis present

## 2020-05-13 DIAGNOSIS — Z7901 Long term (current) use of anticoagulants: Secondary | ICD-10-CM | POA: Insufficient documentation

## 2020-05-13 DIAGNOSIS — I2510 Atherosclerotic heart disease of native coronary artery without angina pectoris: Secondary | ICD-10-CM | POA: Insufficient documentation

## 2020-05-13 DIAGNOSIS — E039 Hypothyroidism, unspecified: Secondary | ICD-10-CM | POA: Diagnosis not present

## 2020-05-13 DIAGNOSIS — R4182 Altered mental status, unspecified: Secondary | ICD-10-CM | POA: Diagnosis not present

## 2020-05-13 DIAGNOSIS — R410 Disorientation, unspecified: Secondary | ICD-10-CM | POA: Diagnosis not present

## 2020-05-13 DIAGNOSIS — I130 Hypertensive heart and chronic kidney disease with heart failure and stage 1 through stage 4 chronic kidney disease, or unspecified chronic kidney disease: Secondary | ICD-10-CM | POA: Insufficient documentation

## 2020-05-13 DIAGNOSIS — N183 Chronic kidney disease, stage 3 unspecified: Secondary | ICD-10-CM | POA: Diagnosis not present

## 2020-05-13 DIAGNOSIS — I491 Atrial premature depolarization: Secondary | ICD-10-CM | POA: Diagnosis not present

## 2020-05-13 DIAGNOSIS — E785 Hyperlipidemia, unspecified: Secondary | ICD-10-CM | POA: Insufficient documentation

## 2020-05-13 DIAGNOSIS — Z20822 Contact with and (suspected) exposure to covid-19: Secondary | ICD-10-CM | POA: Diagnosis not present

## 2020-05-13 DIAGNOSIS — E1129 Type 2 diabetes mellitus with other diabetic kidney complication: Secondary | ICD-10-CM | POA: Diagnosis present

## 2020-05-13 DIAGNOSIS — I4891 Unspecified atrial fibrillation: Secondary | ICD-10-CM | POA: Insufficient documentation

## 2020-05-13 DIAGNOSIS — Z66 Do not resuscitate: Secondary | ICD-10-CM | POA: Diagnosis present

## 2020-05-13 DIAGNOSIS — R29810 Facial weakness: Secondary | ICD-10-CM | POA: Diagnosis not present

## 2020-05-13 DIAGNOSIS — I517 Cardiomegaly: Secondary | ICD-10-CM | POA: Diagnosis not present

## 2020-05-13 DIAGNOSIS — R404 Transient alteration of awareness: Secondary | ICD-10-CM | POA: Diagnosis not present

## 2020-05-13 DIAGNOSIS — F0150 Vascular dementia without behavioral disturbance: Secondary | ICD-10-CM | POA: Diagnosis present

## 2020-05-13 DIAGNOSIS — I6782 Cerebral ischemia: Secondary | ICD-10-CM | POA: Diagnosis not present

## 2020-05-13 DIAGNOSIS — E1122 Type 2 diabetes mellitus with diabetic chronic kidney disease: Secondary | ICD-10-CM | POA: Insufficient documentation

## 2020-05-13 HISTORY — DX: Vascular dementia, unspecified severity, without behavioral disturbance, psychotic disturbance, mood disturbance, and anxiety: F01.50

## 2020-05-13 HISTORY — DX: Disorder of kidney and ureter, unspecified: N28.9

## 2020-05-13 HISTORY — DX: Muscle weakness (generalized): M62.81

## 2020-05-13 LAB — COMPREHENSIVE METABOLIC PANEL
ALT: 11 U/L (ref 0–44)
AST: 22 U/L (ref 15–41)
Albumin: 2.4 g/dL — ABNORMAL LOW (ref 3.5–5.0)
Alkaline Phosphatase: 71 U/L (ref 38–126)
Anion gap: 11 (ref 5–15)
BUN: 44 mg/dL — ABNORMAL HIGH (ref 8–23)
CO2: 27 mmol/L (ref 22–32)
Calcium: 9.3 mg/dL (ref 8.9–10.3)
Chloride: 99 mmol/L (ref 98–111)
Creatinine, Ser: 1.59 mg/dL — ABNORMAL HIGH (ref 0.44–1.00)
GFR, Estimated: 31 mL/min — ABNORMAL LOW (ref >60)
Glucose, Bld: 227 mg/dL — ABNORMAL HIGH (ref 70–99)
Potassium: 4.1 mmol/L (ref 3.5–5.1)
Sodium: 137 mmol/L (ref 135–145)
Total Bilirubin: 0.5 mg/dL (ref 0.3–1.2)
Total Protein: 7.7 g/dL (ref 6.5–8.1)

## 2020-05-13 LAB — CBC WITH DIFFERENTIAL/PLATELET
Abs Immature Granulocytes: 0.1 10*3/uL — ABNORMAL HIGH (ref 0.00–0.07)
Basophils Absolute: 0.1 10*3/uL (ref 0.0–0.1)
Basophils Relative: 1 %
Eosinophils Absolute: 0.2 10*3/uL (ref 0.0–0.5)
Eosinophils Relative: 2 %
HCT: 36.7 % (ref 36.0–46.0)
Hemoglobin: 11.4 g/dL — ABNORMAL LOW (ref 12.0–15.0)
Immature Granulocytes: 1 %
Lymphocytes Relative: 25 %
Lymphs Abs: 2.2 10*3/uL (ref 0.7–4.0)
MCH: 30.6 pg (ref 26.0–34.0)
MCHC: 31.1 g/dL (ref 30.0–36.0)
MCV: 98.4 fL (ref 80.0–100.0)
Monocytes Absolute: 1.1 10*3/uL — ABNORMAL HIGH (ref 0.1–1.0)
Monocytes Relative: 13 %
Neutro Abs: 5.1 10*3/uL (ref 1.7–7.7)
Neutrophils Relative %: 58 %
Platelets: 328 10*3/uL (ref 150–400)
RBC: 3.73 MIL/uL — ABNORMAL LOW (ref 3.87–5.11)
RDW: 16.2 % — ABNORMAL HIGH (ref 11.5–15.5)
WBC: 8.8 10*3/uL (ref 4.0–10.5)
nRBC: 0 % (ref 0.0–0.2)

## 2020-05-13 LAB — GLUCOSE, CAPILLARY
Glucose-Capillary: 128 mg/dL — ABNORMAL HIGH (ref 70–99)
Glucose-Capillary: 184 mg/dL — ABNORMAL HIGH (ref 70–99)

## 2020-05-13 LAB — URINALYSIS, ROUTINE W REFLEX MICROSCOPIC
Bilirubin Urine: NEGATIVE
Glucose, UA: NEGATIVE mg/dL
Ketones, ur: NEGATIVE mg/dL
Nitrite: NEGATIVE
Protein, ur: 100 mg/dL — AB
Specific Gravity, Urine: 1.011 (ref 1.005–1.030)
WBC, UA: >50 WBC/hpf — ABNORMAL HIGH (ref 0–5)
pH: 7 (ref 5.0–8.0)

## 2020-05-13 LAB — TSH: TSH: 1.702 u[IU]/mL (ref 0.350–4.500)

## 2020-05-13 LAB — SARS CORONAVIRUS 2 (TAT 6-24 HRS): SARS Coronavirus 2: NEGATIVE

## 2020-05-13 LAB — CBG MONITORING, ED: Glucose-Capillary: 206 mg/dL — ABNORMAL HIGH (ref 70–99)

## 2020-05-13 IMAGING — MR MR MRA HEAD W/O CM
2 series · 17 of 48 positions shown · non-contrast
Comparison: Prior head CT examinations [DATE] and earlier.

CLINICAL DATA: Transient ischemic attack (TIA).

EXAM:
MRI HEAD WITHOUT CONTRAST
MRA HEAD WITHOUT CONTRAST
TECHNIQUE: Multiplanar, multiecho pulse sequences of the brain and surrounding
structures were obtained without intravenous contrast. Angiographic
images of the head were obtained using MRA technique without
contrast.

[Series 2: ax (id) · axial · 1.0mm · 0.43mm/px · z∈[-84,-3]mm · 12 of 176 slices shown]
[im 1/176]
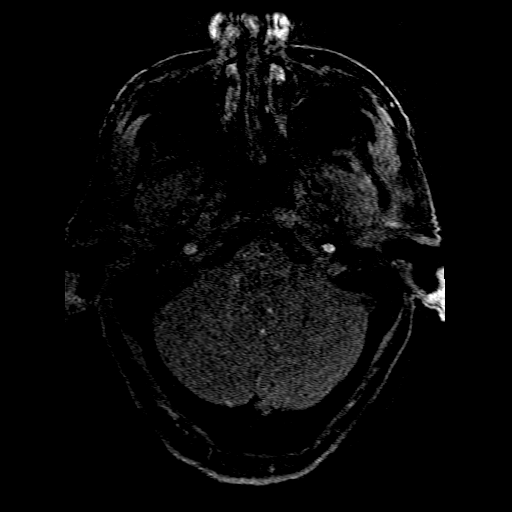
[im 9/176]
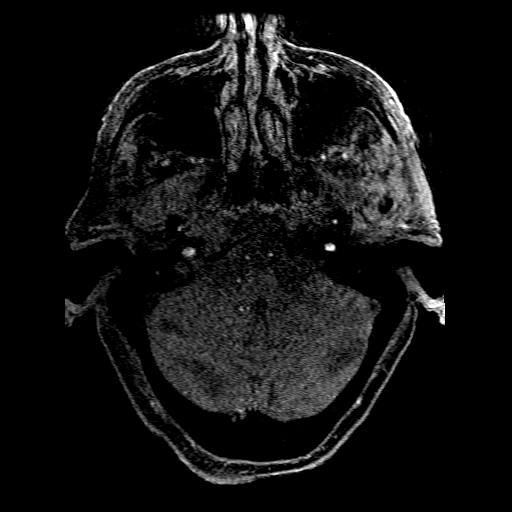
[im 30/176]
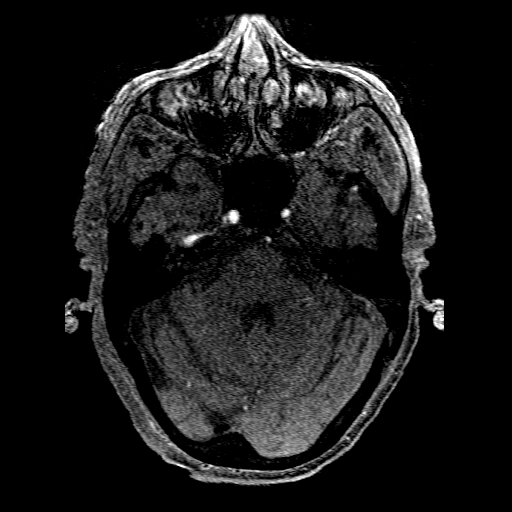
[im 34/176]
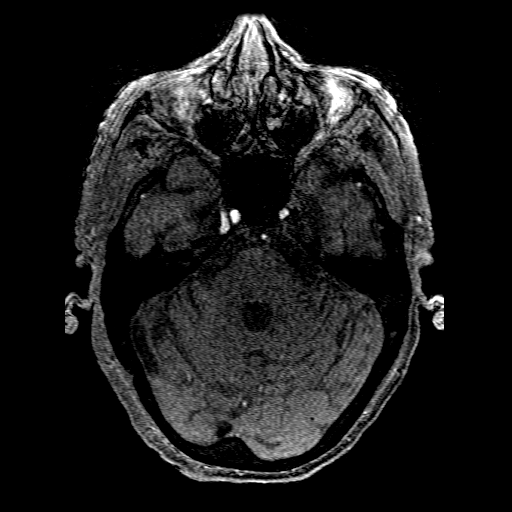
[im 55/176]
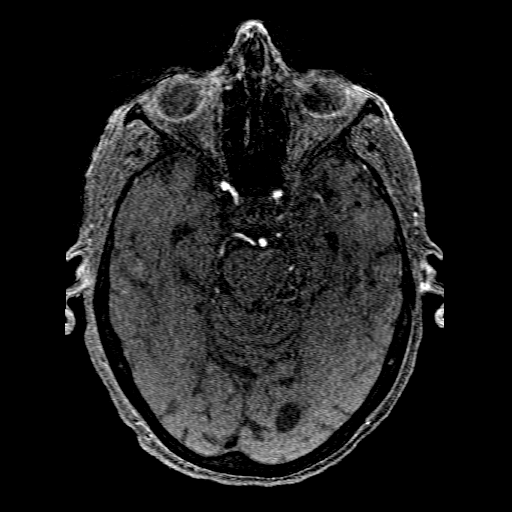
[im 76/176]
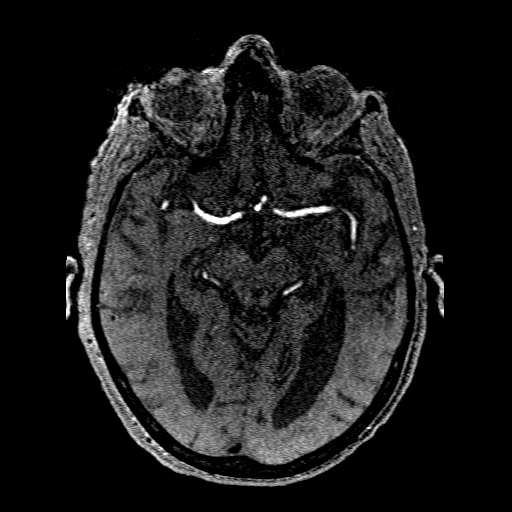
[im 88/176]
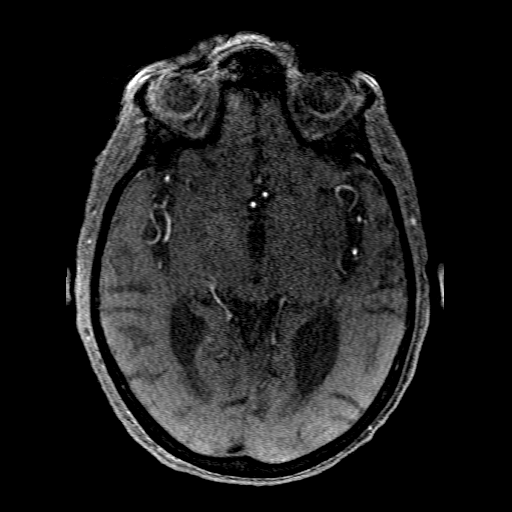
[im 101/176]
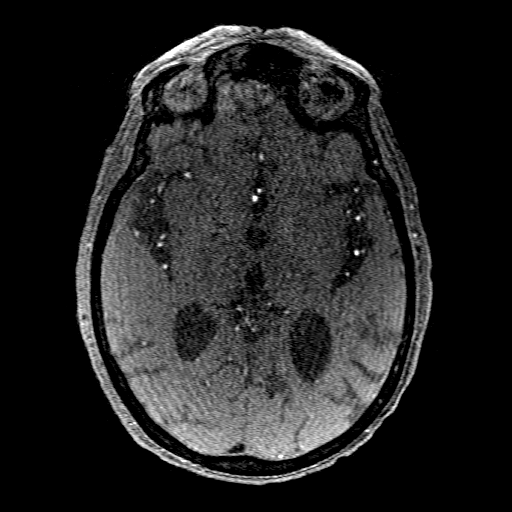
[im 121/176]
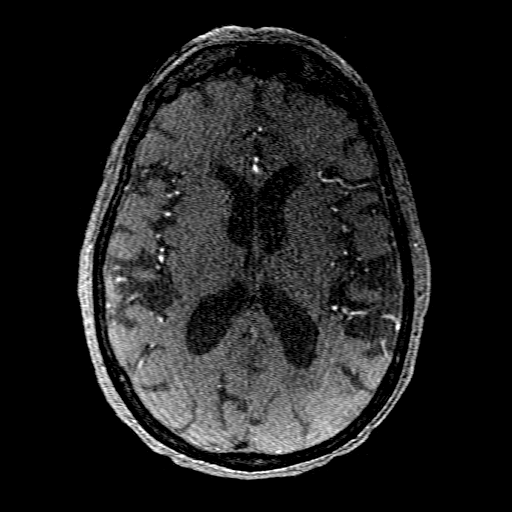
[im 142/176]
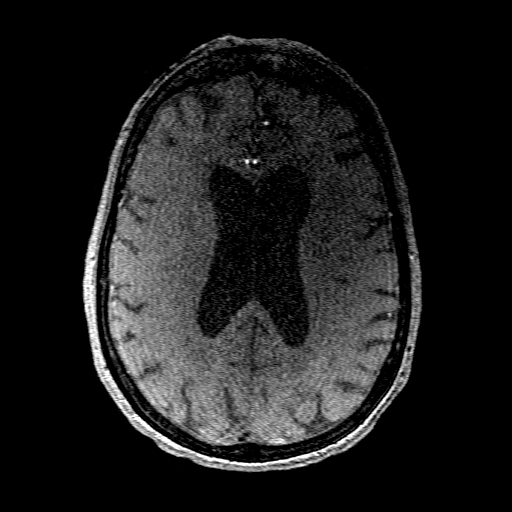
[im 146/176]
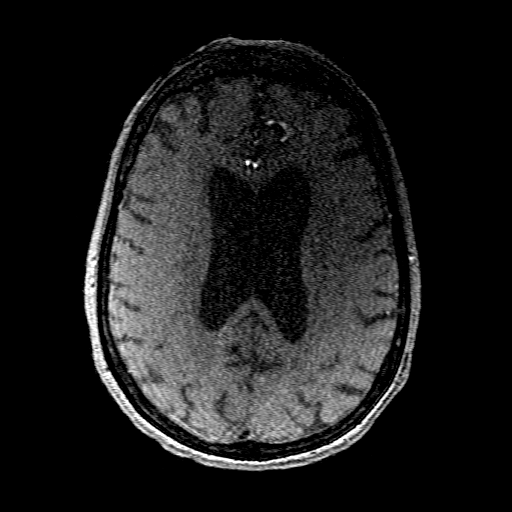
[im 167/176]
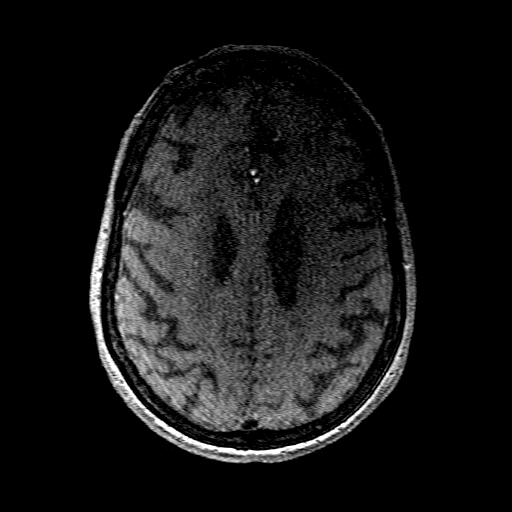

[Series 201: pjn:ax (id) · sagittal · 1.0mm · 0.43mm/px · 5 of 19 slices shown]
[im 1/19]
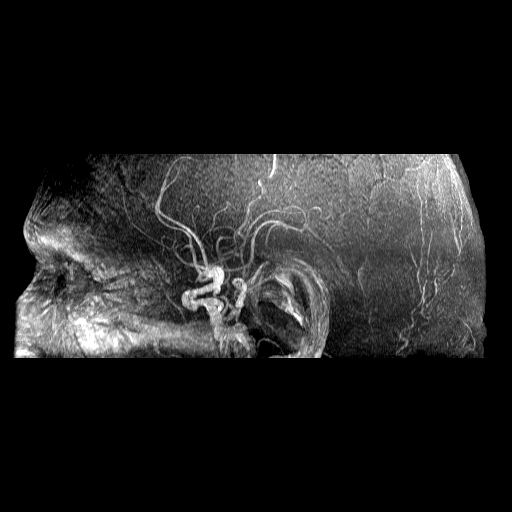
[im 5/19]
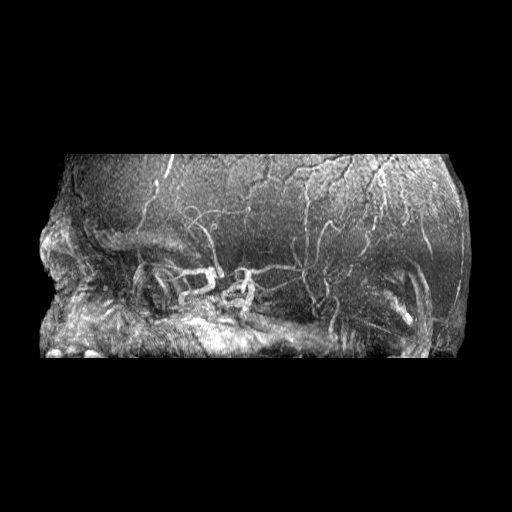
[im 10/19]
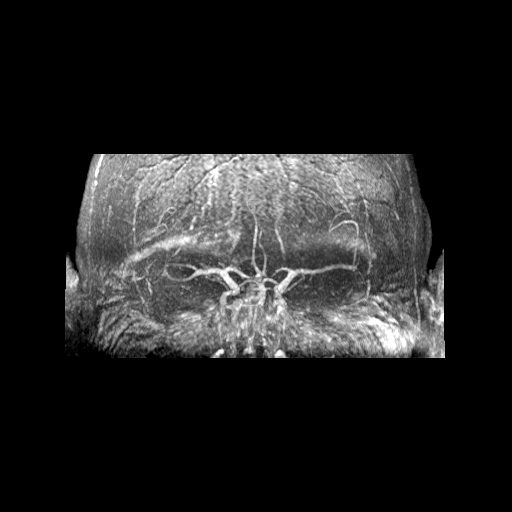
[im 14/19]
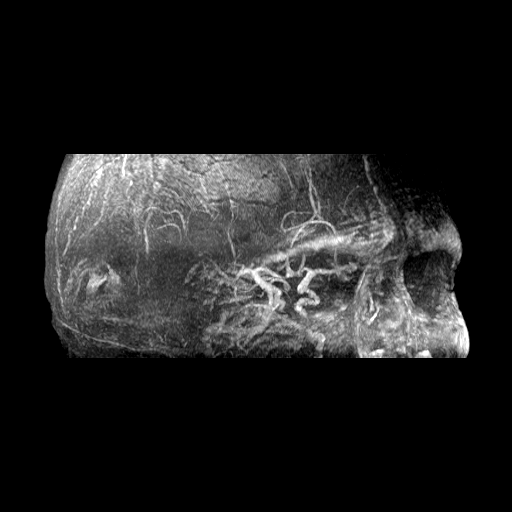
[im 19/19]
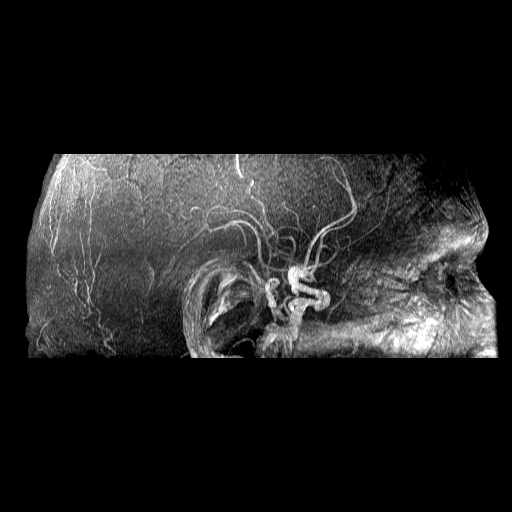

[17 of 48 positions shown; findings below may reference images not displayed]

FINDINGS: MRI HEAD FINDINGS

Brain:

The examination is intermittently motion degraded. Most notably,
there is moderate motion degradation of the sagittal T1 weighted
sequence and moderate/severe motion degradation of the axial
T2/FLAIR sequence.

Mild-to-moderate cerebral and cerebellar atrophy.

There are 4 subcentimeter acute infarcts within the cortex and
subcortical white matter of the posterior left frontal lobe.
Notably, two of these infarcts involve the left motor strip. These
infarcts are located in the left MCA territory and left MCA/ACA
watershed territory.

Chronic lacunar infarcts within the bilateral corona radiata/basal
ganglia and right thalamus.

Background moderately advanced multifocal T2/FLAIR hyperintensity
within the cerebral white matter which is nonspecific, but
compatible with chronic small vessel ischemic disease. To a lesser
degree, chronic small vessel ischemic changes are also present
within the pons.

Scattered supratentorial and infratentorial chronic
microhemorrhages.

No evidence of intracranial mass.

No extra-axial fluid collection.

No midline shift.

Vascular: Reported below.

Skull and upper cervical spine: Within the limitations of motion
degradation, no focal marrow lesion is identified.

Sinuses/Orbits: Visualized orbits show no acute finding. Trace
ethmoid sinus mucosal thickening.

MRA HEAD FINDINGS

The intracranial internal carotid arteries are patent. Persistent
primitive trigeminal artery on the right. The M1 middle cerebral
arteries are patent. No M2 proximal branch occlusion or high-grade
proximal stenosis is identified. The anterior cerebral arteries are
patent.

3 mm aneurysm arising from the proximal aspect of the persistent
primitive trigeminal artery (for instance as seen on series 2, image
47) (series 252, image 5). Suspected 1-2 mm aneurysm arising from
the cavernous right ICA (series 2, image 45). 2 mm laterally
projecting aneurysm arising from the cavernous left ICA (series 2,
image 46). 2 mm medially projecting vascular protrusion arising from
the distal cavernous left ICA, likely aneurysm (series 2, image 52).
2-3 mm inferiorly projecting vascular protrusion arising from the
supraclinoid left ICA, likely aneurysm (series 2, image 57).

Developmentally diminutive vertebral and proximal basilar arteries.
The right vertebral artery appears to terminate predominantly as the
right PICA. The basilar artery is patent. The posterior cerebral
arteries are patent without significant proximal stenosis. Posterior
communicating arteries are hypoplastic or absent bilaterally.
IMPRESSION: MRI brain:

1. Motion degraded examination as described.
2. Four subcentimeter acute infarcts within the cortex and
subcortical white matter of the posterior left frontal lobe (MCA
territory and MCA/ACA watershed territory). Notably, two of these
infarcts involve the left motor strip.
3. Chronic lacunar infarcts within the bilateral corona
radiata/basal ganglia and right thalamus.
4. Background moderately advanced chronic small vessel ischemic
disease.
5. Scattered supratentorial greater than infratentorial chronic
microhemorrhages, which may reflect sequela of hypertensive
microangiopathy and/or cerebral amyloid angiopathy.
6. Mild-to-moderate atrophy of the brain.

MRA head:

1. No intracranial large vessel occlusion or high-grade proximal
arterial stenosis.
2. Persistent primitive trigeminal artery on the right.
Developmentally diminutive vertebral and proximal basilar arteries.
3. Multiple intracranial aneurysms as follows. 3 mm aneurysm arising
from the proximal aspect of the persistent primitive trigeminal
artery. Suspected 1-2 mm aneurysm arising from the cavernous right
ICA. 2 mm aneurysm arising from the cavernous left ICA. 2 mm
aneurysm arising from the cavernous left ICA more distally. 2-3 mm
aneurysm arising from the supraclinoid left ICA. Nonemergent
neurointerventional consultation should be considered.

## 2020-05-13 IMAGING — MR MR HEAD W/O CM
6 of 11 series · 24 of 48 positions shown · non-contrast
Comparison: Prior head CT examinations [DATE] and earlier.

CLINICAL DATA: Transient ischemic attack (TIA).

EXAM:
MRI HEAD WITHOUT CONTRAST
MRA HEAD WITHOUT CONTRAST
TECHNIQUE: Multiplanar, multiecho pulse sequences of the brain and surrounding
structures were obtained without intravenous contrast. Angiographic
images of the head were obtained using MRA technique without
contrast.

[Series 2: DWI · axial · 3.0mm · 0.94mm/px · z∈[-113,+36]mm · 7 of 104 slices shown (1 of 2)]
[im 1/104]
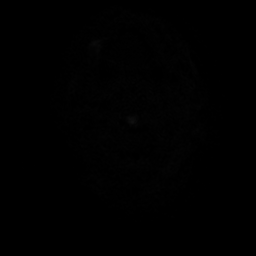
[im 18/104]
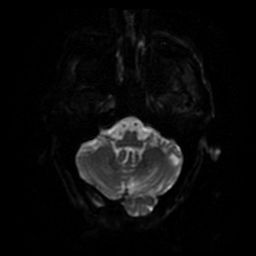
[im 35/104]
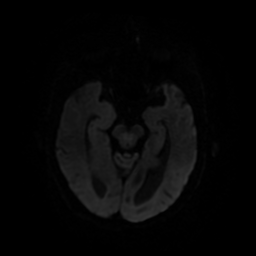
[im 52/104]
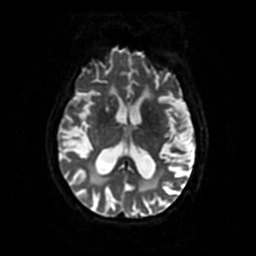
[im 69/104]
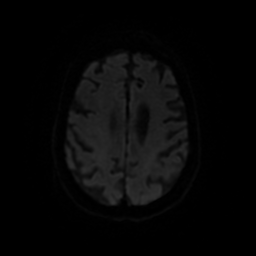
[im 86/104]
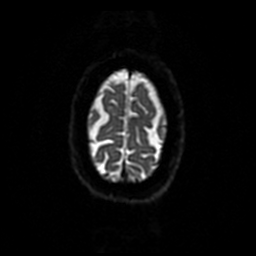
[im 104/104]
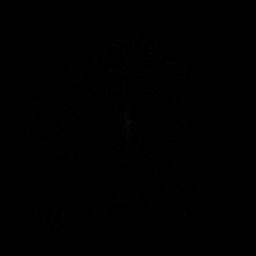

[Series 3: DWI · coronal · 4.0mm · 0.94mm/px · 6 of 74 slices shown (2 of 2)]
[im 1/74]
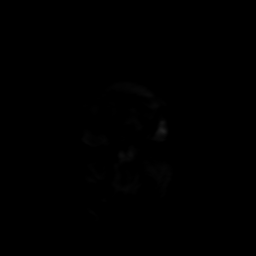
[im 15/74]
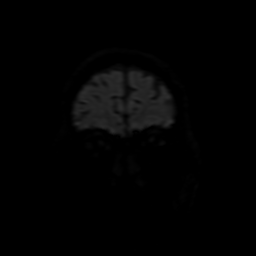
[im 30/74]
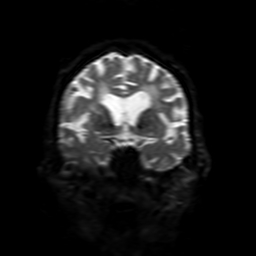
[im 44/74]
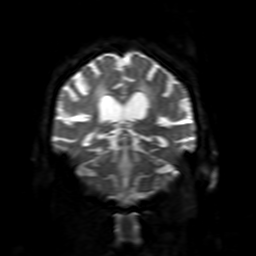
[im 59/74]
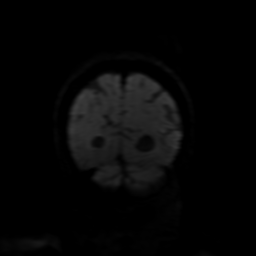
[im 74/74]
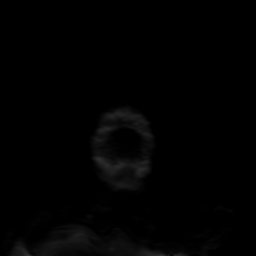

[Series 4: FLAIR · sagittal · 5.0mm · 0.23mm/px · 2 of 26 slices shown (1 of 2)]
[im 1/26]
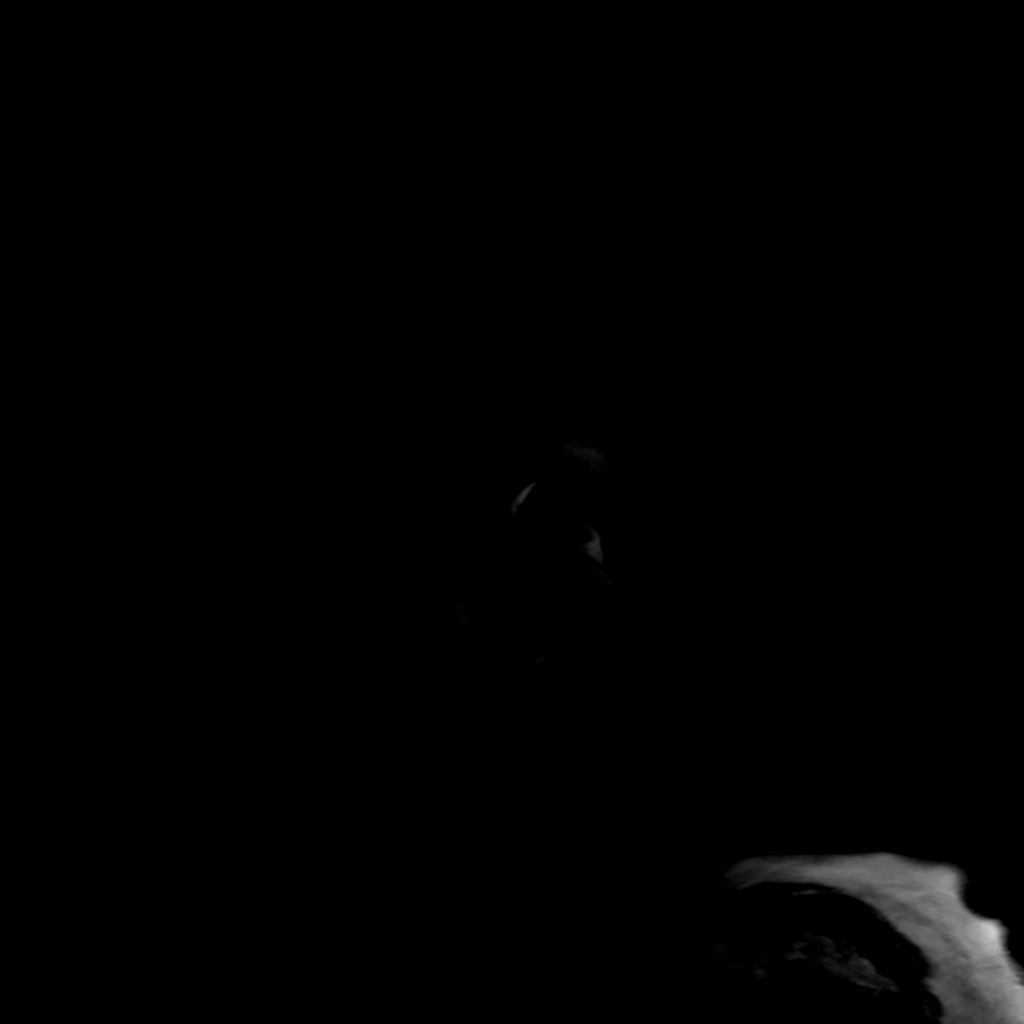
[im 26/26]
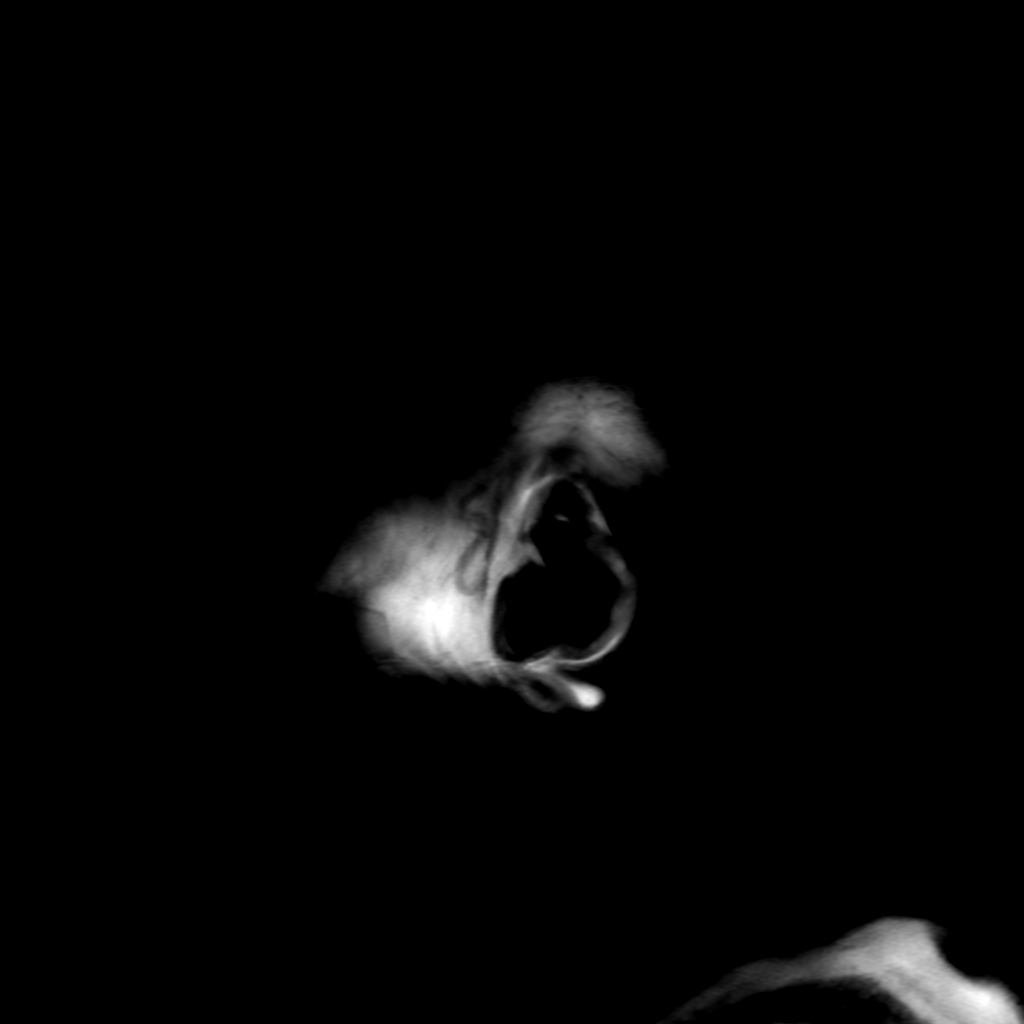

[Series 7: FLAIR · axial · 3.0mm · 0.45mm/px · z∈[-111,+35]mm · 2 of 26 slices shown (2 of 2)]
[im 1/26]
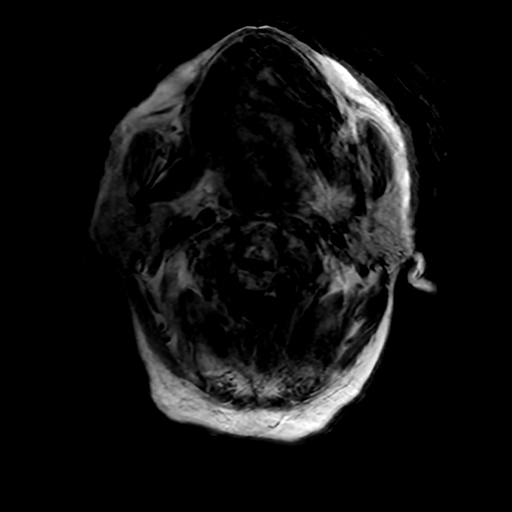
[im 26/26]
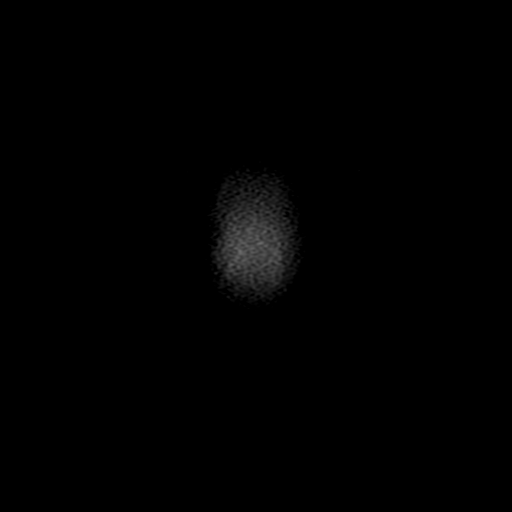

[Series 250: ADC · axial · 3.0mm · 0.94mm/px · z∈[-113,+36]mm · 4 of 52 slices shown (1 of 2)]
[im 1/52]
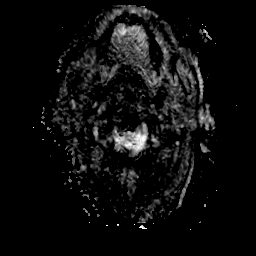
[im 18/52]
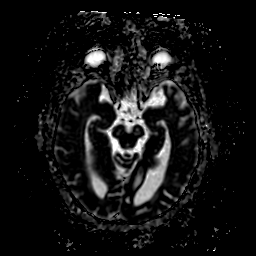
[im 35/52]
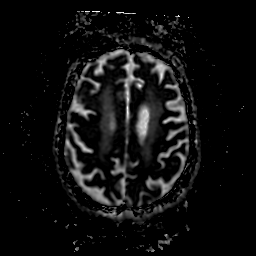
[im 52/52]
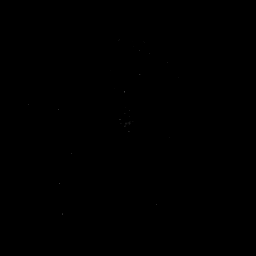

[Series 350: ADC · coronal · 4.0mm · 0.94mm/px · 3 of 36 slices shown (2 of 2)]
[im 1/36]
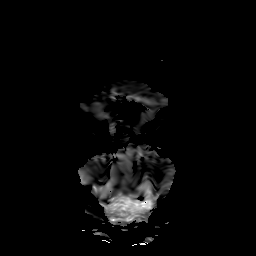
[im 18/36]
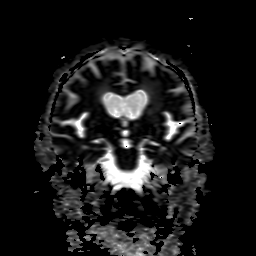
[im 36/36]
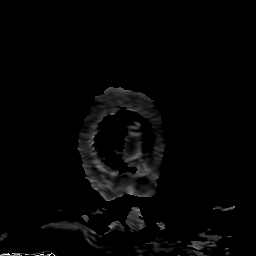

[24 of 48 positions shown; findings below may reference images not displayed]

FINDINGS: MRI HEAD FINDINGS

Brain:

The examination is intermittently motion degraded. Most notably,
there is moderate motion degradation of the sagittal T1 weighted
sequence and moderate/severe motion degradation of the axial
T2/FLAIR sequence.

Mild-to-moderate cerebral and cerebellar atrophy.

There are 4 subcentimeter acute infarcts within the cortex and
subcortical white matter of the posterior left frontal lobe.
Notably, two of these infarcts involve the left motor strip. These
infarcts are located in the left MCA territory and left MCA/ACA
watershed territory.

Chronic lacunar infarcts within the bilateral corona radiata/basal
ganglia and right thalamus.

Background moderately advanced multifocal T2/FLAIR hyperintensity
within the cerebral white matter which is nonspecific, but
compatible with chronic small vessel ischemic disease. To a lesser
degree, chronic small vessel ischemic changes are also present
within the pons.

Scattered supratentorial and infratentorial chronic
microhemorrhages.

No evidence of intracranial mass.

No extra-axial fluid collection.

No midline shift.

Vascular: Reported below.

Skull and upper cervical spine: Within the limitations of motion
degradation, no focal marrow lesion is identified.

Sinuses/Orbits: Visualized orbits show no acute finding. Trace
ethmoid sinus mucosal thickening.

MRA HEAD FINDINGS

The intracranial internal carotid arteries are patent. Persistent
primitive trigeminal artery on the right. The M1 middle cerebral
arteries are patent. No M2 proximal branch occlusion or high-grade
proximal stenosis is identified. The anterior cerebral arteries are
patent.

3 mm aneurysm arising from the proximal aspect of the persistent
primitive trigeminal artery (for instance as seen on series 2, image
47) (series 252, image 5). Suspected 1-2 mm aneurysm arising from
the cavernous right ICA (series 2, image 45). 2 mm laterally
projecting aneurysm arising from the cavernous left ICA (series 2,
image 46). 2 mm medially projecting vascular protrusion arising from
the distal cavernous left ICA, likely aneurysm (series 2, image 52).
2-3 mm inferiorly projecting vascular protrusion arising from the
supraclinoid left ICA, likely aneurysm (series 2, image 57).

Developmentally diminutive vertebral and proximal basilar arteries.
The right vertebral artery appears to terminate predominantly as the
right PICA. The basilar artery is patent. The posterior cerebral
arteries are patent without significant proximal stenosis. Posterior
communicating arteries are hypoplastic or absent bilaterally.
IMPRESSION: MRI brain:

1. Motion degraded examination as described.
2. Four subcentimeter acute infarcts within the cortex and
subcortical white matter of the posterior left frontal lobe (MCA
territory and MCA/ACA watershed territory). Notably, two of these
infarcts involve the left motor strip.
3. Chronic lacunar infarcts within the bilateral corona
radiata/basal ganglia and right thalamus.
4. Background moderately advanced chronic small vessel ischemic
disease.
5. Scattered supratentorial greater than infratentorial chronic
microhemorrhages, which may reflect sequela of hypertensive
microangiopathy and/or cerebral amyloid angiopathy.
6. Mild-to-moderate atrophy of the brain.

MRA head:

1. No intracranial large vessel occlusion or high-grade proximal
arterial stenosis.
2. Persistent primitive trigeminal artery on the right.
Developmentally diminutive vertebral and proximal basilar arteries.
3. Multiple intracranial aneurysms as follows. 3 mm aneurysm arising
from the proximal aspect of the persistent primitive trigeminal
artery. Suspected 1-2 mm aneurysm arising from the cavernous right
ICA. 2 mm aneurysm arising from the cavernous left ICA. 2 mm
aneurysm arising from the cavernous left ICA more distally. 2-3 mm
aneurysm arising from the supraclinoid left ICA. Nonemergent
neurointerventional consultation should be considered.

## 2020-05-13 IMAGING — CT CT HEAD W/O CM
4 series · 17 of 47 positions shown, 19 images · non-contrast
Comparison: [DATE].

CLINICAL DATA: Altered mental status.

EXAM:
CT HEAD WITHOUT CONTRAST
TECHNIQUE: Contiguous axial images were obtained from the base of the skull
through the vertex without intravenous contrast.

[Series 3: head without · axial · non-contrast · 0.46mm/px · z∈[-148,-23]mm · 7 of 35 slices shown, 9 images]
[im 5/35  brain]
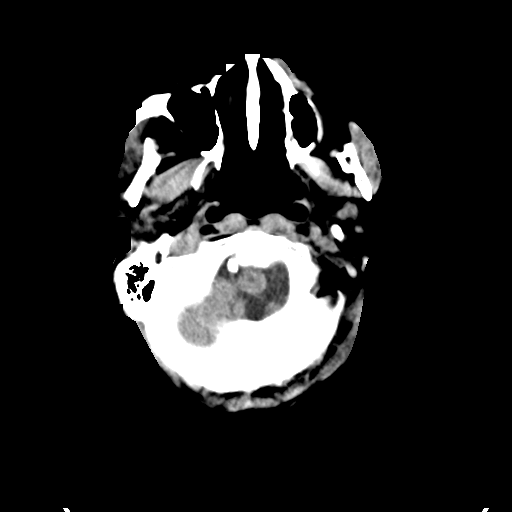
[im 5/35  bone]
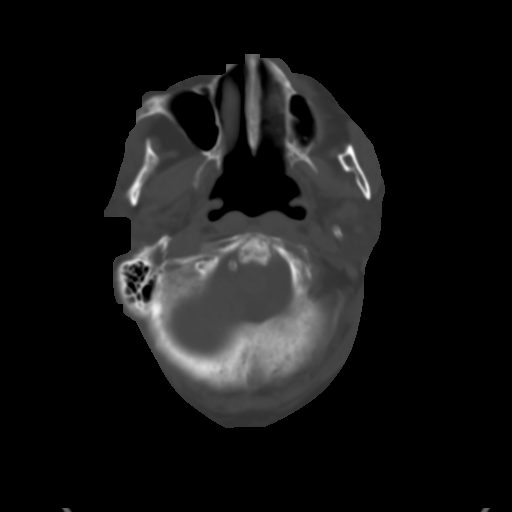
[im 9/35  brain]
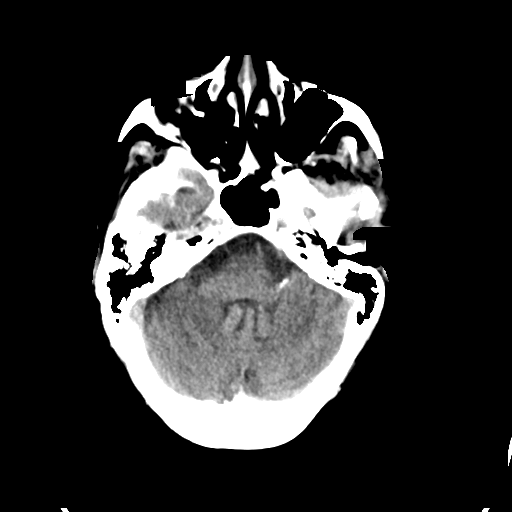
[im 13/35  brain]
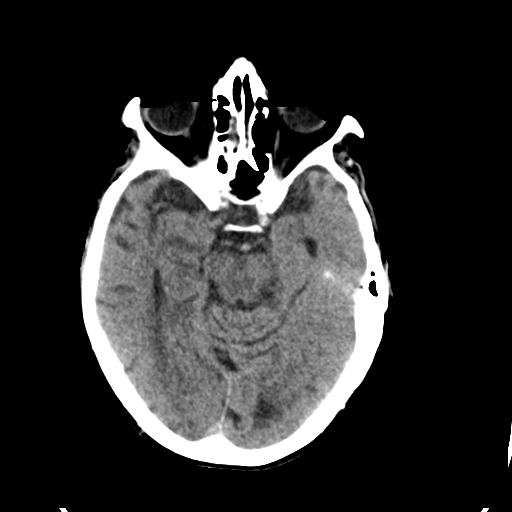
[im 18/35  brain]
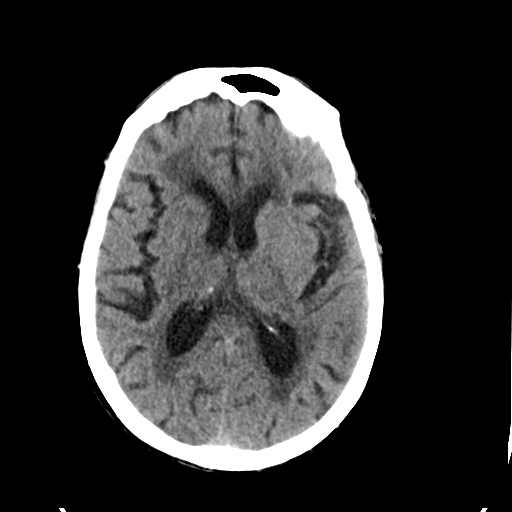
[im 22/35  brain]
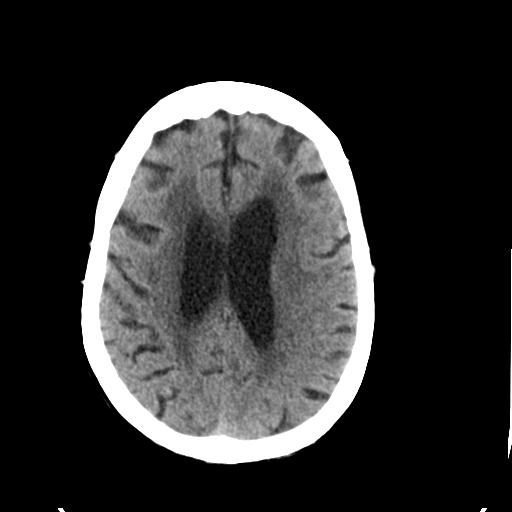
[im 22/35  bone]
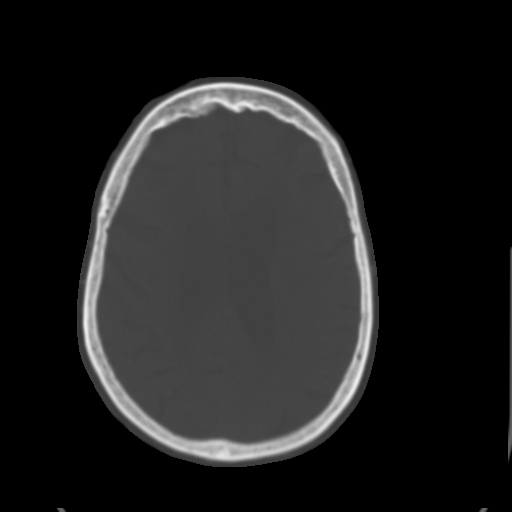
[im 26/35  brain]
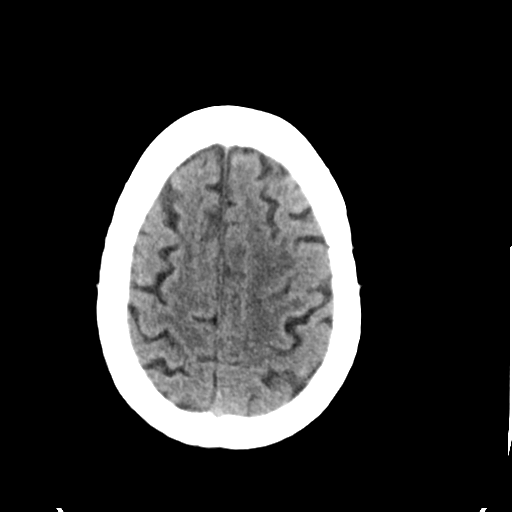
[im 30/35  brain]
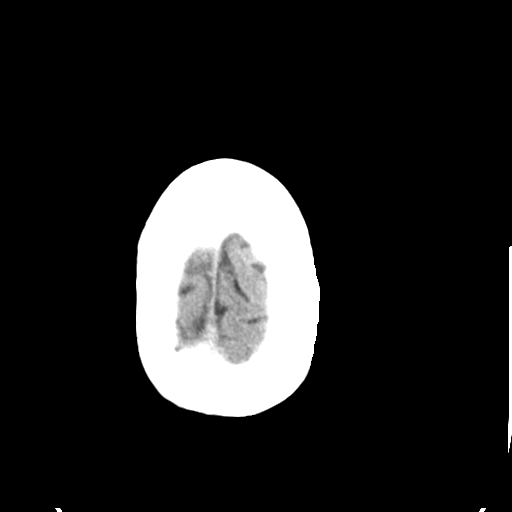

[Series 4: head bone · axial · 0.46mm/px · z∈[-152,-92]mm · 4 of 86 slices shown]
[im 9/86  bone]
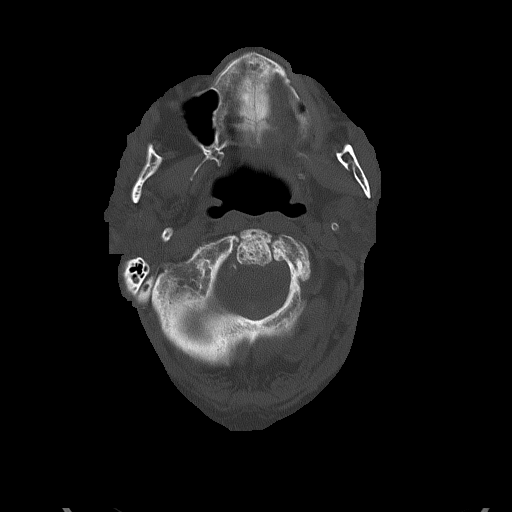
[im 18/86  bone]
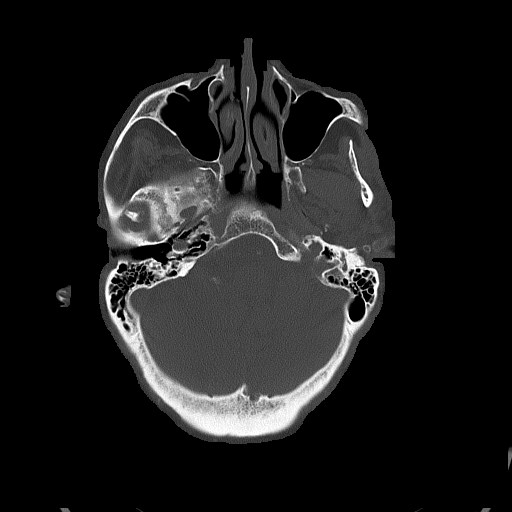
[im 26/86  bone]
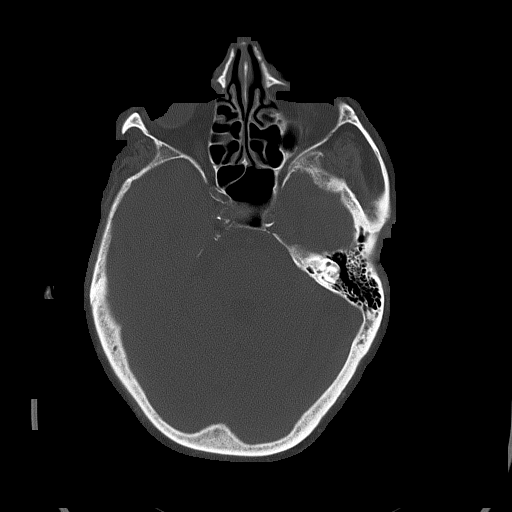
[im 39/86  bone]
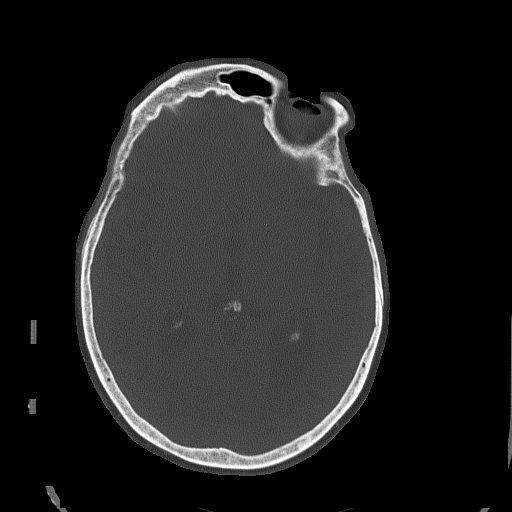

[Series 5: head without cor · coronal · non-contrast · 0.33mm/px · 3 of 66 slices shown]
[im 22/66  brain]
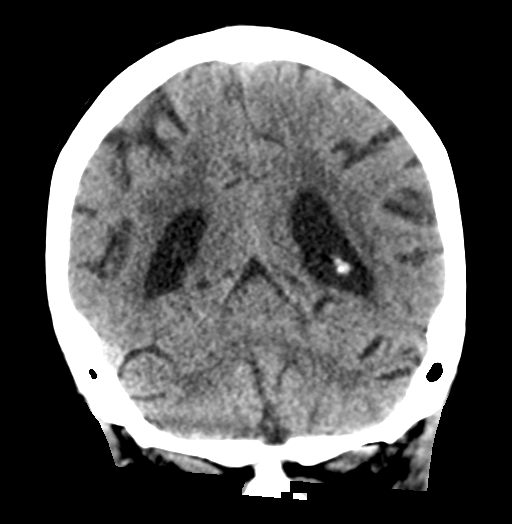
[im 29/66  brain]
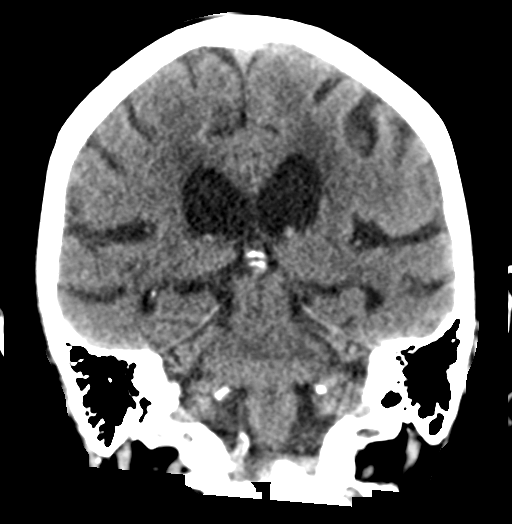
[im 37/66  brain]
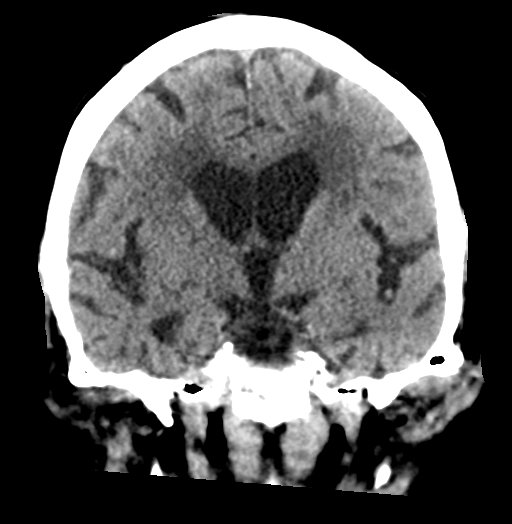

[Series 6: head without sag · sagittal · non-contrast · 0.33mm/px · 3 of 56 slices shown]
[im 19/56  brain]
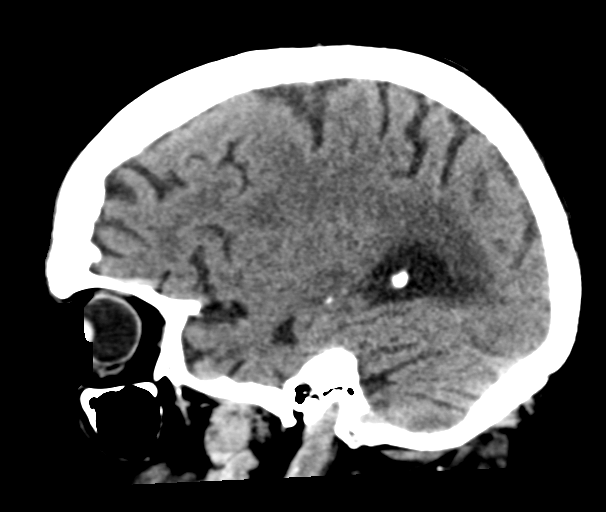
[im 28/56  brain]
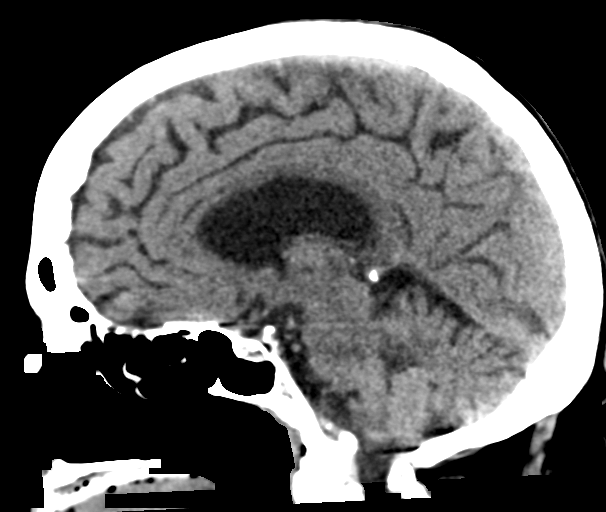
[im 37/56  brain]
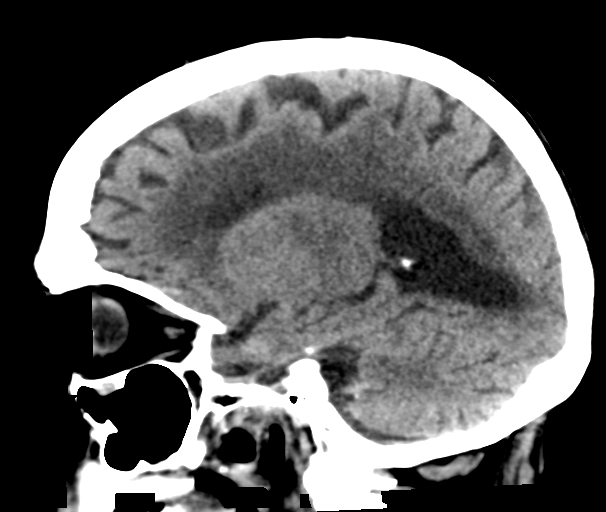

[17 of 47 positions shown; findings below may reference images not displayed]

FINDINGS: Brain: Mild chronic ischemic white matter disease is noted. No mass
effect or midline shift is noted. Ventricular size is within normal
limits. There is no evidence of mass lesion, hemorrhage or acute
infarction.

Vascular: No hyperdense vessel or unexpected calcification.

Skull: Normal. Negative for fracture or focal lesion.

Sinuses/Orbits: No acute finding.

Other: None.
IMPRESSION: Mild chronic ischemic white matter disease. No acute intracranial
abnormality seen.

## 2020-05-13 IMAGING — CR DG CHEST 2V
2 series · 2 of 2 positions shown · non-contrast
Comparison: [DATE]

CLINICAL DATA: Confusion.

EXAM:
CHEST - 2 VIEW

[chest lat]
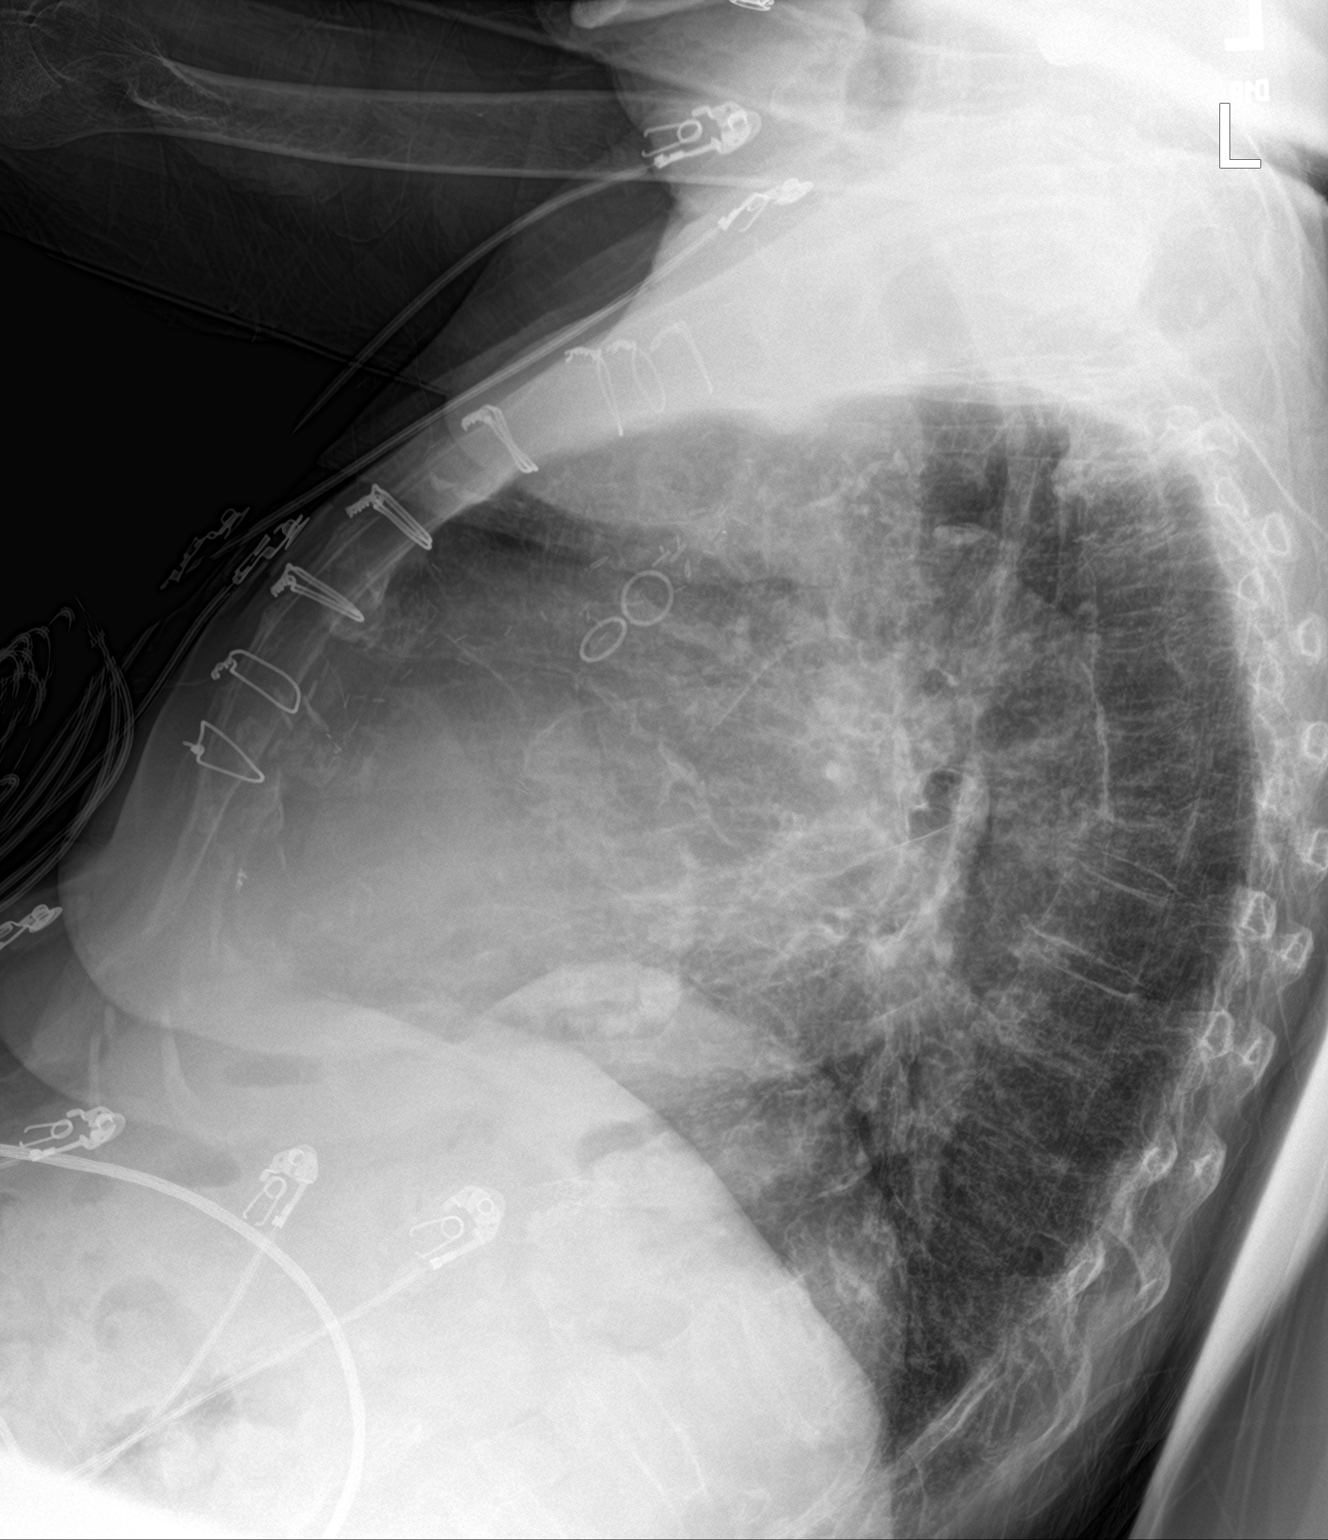

[chest ap]
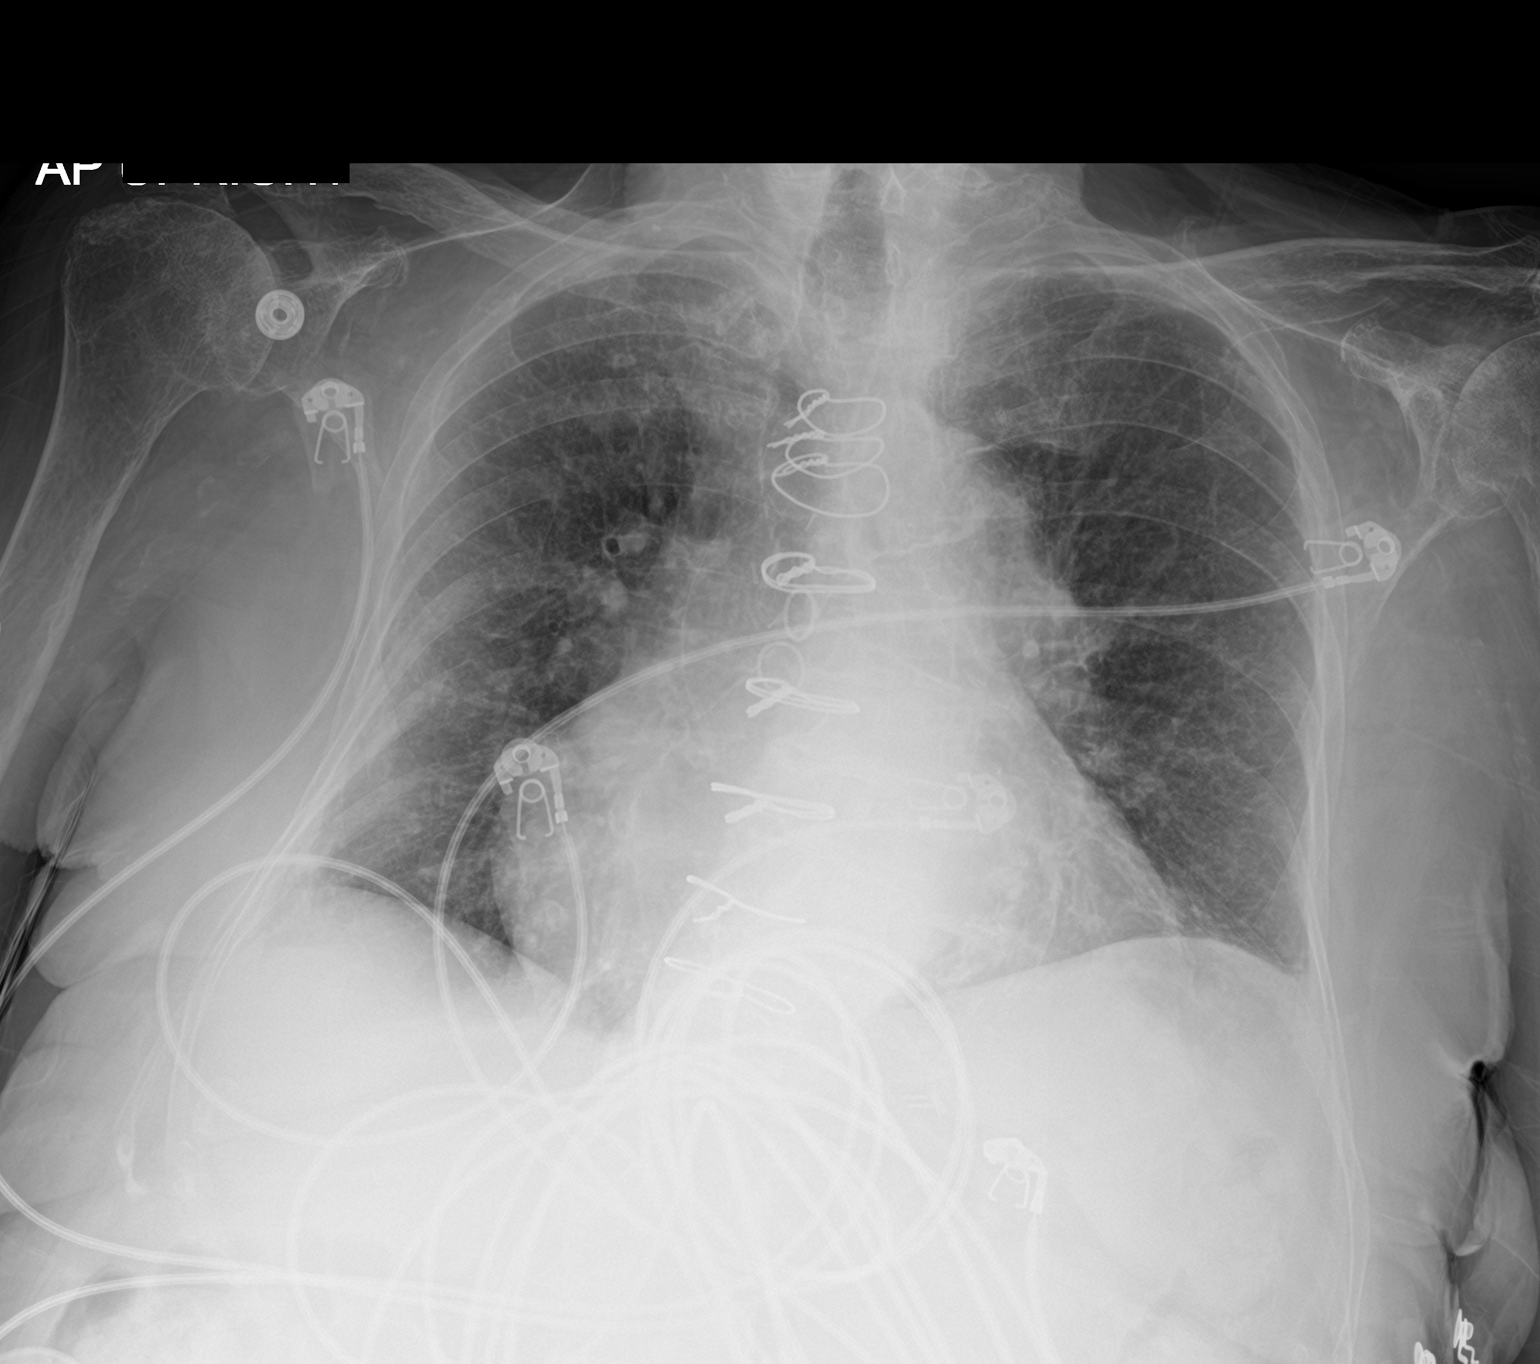

[2 of 2 positions shown; findings below may reference images not displayed]

FINDINGS: Osteopenia.  Thoracic vertebral body height loss at multiple levels.

Midline trachea. Moderate cardiomegaly. Tortuous thoracic aorta.
Atherosclerosis in the transverse aorta. Prior median sternotomy. No
pleural effusion or pneumothorax. Chronic mild interstitial
thickening, without overt congestive failure. No lobar
consolidation.
IMPRESSION: No acute cardiopulmonary disease.

Cardiomegaly without congestive failure.

Aortic Atherosclerosis ([ZM]-[ZM]).

## 2020-05-13 MED ORDER — MIRTAZAPINE 15 MG PO TABS
7.5000 mg | ORAL_TABLET | Freq: Every day | ORAL | Status: DC
  Administered 2020-05-13 – 2020-05-14 (×2): 7.5 mg via ORAL
  Filled 2020-05-13 (×2): qty 1

## 2020-05-13 MED ORDER — SODIUM CHLORIDE 0.9 % IV SOLN: INTRAVENOUS | Status: DC

## 2020-05-13 MED ORDER — POLYVINYL ALCOHOL 1.4 % OP SOLN
1.0000 [drp] | Freq: Four times a day (QID) | OPHTHALMIC | Status: DC
  Administered 2020-05-13 – 2020-05-15 (×6): 1 [drp] via OPHTHALMIC
  Filled 2020-05-13: qty 15

## 2020-05-13 MED ORDER — INSULIN ASPART 100 UNIT/ML ~~LOC~~ SOLN
0.0000 [IU] | Freq: Three times a day (TID) | SUBCUTANEOUS | Status: DC
  Administered 2020-05-13 – 2020-05-14 (×3): 1 [IU] via SUBCUTANEOUS
  Administered 2020-05-15: 2 [IU] via SUBCUTANEOUS

## 2020-05-13 MED ORDER — ASPIRIN 300 MG RE SUPP: 300.0000 mg | Freq: Every day | RECTAL | Status: DC

## 2020-05-13 MED ORDER — TRAMADOL HCL 50 MG PO TABS
50.0000 mg | ORAL_TABLET | Freq: Two times a day (BID) | ORAL | Status: DC
  Administered 2020-05-13 – 2020-05-15 (×4): 50 mg via ORAL
  Filled 2020-05-13 (×4): qty 1

## 2020-05-13 MED ORDER — ACETAMINOPHEN 650 MG RE SUPP: 650.0000 mg | RECTAL | Status: DC | PRN

## 2020-05-13 MED ORDER — DOCUSATE SODIUM 100 MG PO CAPS
100.0000 mg | ORAL_CAPSULE | Freq: Two times a day (BID) | ORAL | Status: DC
  Administered 2020-05-13 – 2020-05-15 (×4): 100 mg via ORAL
  Filled 2020-05-13 (×4): qty 1

## 2020-05-13 MED ORDER — ASPIRIN 325 MG PO TABS
325.0000 mg | ORAL_TABLET | Freq: Every day | ORAL | Status: DC
  Administered 2020-05-13 – 2020-05-14 (×2): 325 mg via ORAL
  Filled 2020-05-13 (×2): qty 1

## 2020-05-13 MED ORDER — ACETAMINOPHEN 325 MG PO TABS
650.0000 mg | ORAL_TABLET | ORAL | Status: DC | PRN
  Administered 2020-05-14: 650 mg via ORAL
  Filled 2020-05-13: qty 2

## 2020-05-13 MED ORDER — SENNOSIDES-DOCUSATE SODIUM 8.6-50 MG PO TABS: 1.0000 | ORAL_TABLET | Freq: Every evening | ORAL | Status: DC | PRN

## 2020-05-13 MED ORDER — ENOXAPARIN SODIUM 30 MG/0.3ML ~~LOC~~ SOLN: 30.0000 mg | SUBCUTANEOUS | Status: DC

## 2020-05-13 MED ORDER — MEMANTINE HCL 10 MG PO TABS
10.0000 mg | ORAL_TABLET | Freq: Every day | ORAL | Status: DC
  Administered 2020-05-13 – 2020-05-14 (×2): 10 mg via ORAL
  Filled 2020-05-13 (×2): qty 1

## 2020-05-13 MED ORDER — STROKE: EARLY STAGES OF RECOVERY BOOK: Freq: Once | Status: AC

## 2020-05-13 MED ORDER — POLYETHYLENE GLYCOL 3350 17 G PO PACK
17.0000 g | PACK | Freq: Every day | ORAL | Status: DC
  Administered 2020-05-14 – 2020-05-15 (×2): 17 g via ORAL
  Filled 2020-05-13 (×2): qty 1

## 2020-05-13 MED ORDER — APIXABAN 2.5 MG PO TABS
2.5000 mg | ORAL_TABLET | Freq: Two times a day (BID) | ORAL | Status: DC
  Administered 2020-05-13 – 2020-05-14 (×2): 2.5 mg via ORAL
  Filled 2020-05-13 (×2): qty 1

## 2020-05-13 MED ORDER — ATORVASTATIN CALCIUM 40 MG PO TABS
40.0000 mg | ORAL_TABLET | Freq: Every day | ORAL | Status: DC
  Administered 2020-05-13 – 2020-05-15 (×3): 40 mg via ORAL
  Filled 2020-05-13 (×3): qty 1

## 2020-05-13 MED ORDER — LEVOTHYROXINE SODIUM 75 MCG PO TABS
75.0000 ug | ORAL_TABLET | Freq: Every day | ORAL | Status: DC
  Administered 2020-05-14 – 2020-05-15 (×2): 75 ug via ORAL
  Filled 2020-05-13 (×2): qty 1

## 2020-05-13 MED ORDER — FLUOXETINE HCL 20 MG PO CAPS
20.0000 mg | ORAL_CAPSULE | Freq: Every day | ORAL | Status: DC
  Administered 2020-05-14 – 2020-05-15 (×2): 20 mg via ORAL
  Filled 2020-05-13 (×2): qty 1

## 2020-05-13 MED ORDER — ACETAMINOPHEN 160 MG/5ML PO SOLN: 650.0000 mg | ORAL | Status: DC | PRN

## 2020-05-13 MED ORDER — STROKE: EARLY STAGES OF RECOVERY BOOK
Status: AC
  Filled 2020-05-13: qty 1

## 2020-05-13 MED ORDER — INSULIN ASPART 100 UNIT/ML ~~LOC~~ SOLN
0.0000 [IU] | Freq: Every day | SUBCUTANEOUS | Status: DC
  Administered 2020-05-14: 3 [IU] via SUBCUTANEOUS

## 2020-05-13 NOTE — ED Notes (Signed)
Neuro at bedside.

## 2020-05-13 NOTE — ED Provider Notes (Signed)
[facility] EMERGENCY DEPARTMENT Provider Note   CSN: [id number] Arrival date & time: 05/13/20  1138     History Chief Complaint  Patient presents with  . Altered Mental Status    [name] is a 87 y.o. female presenting for evaluation of altered mental status.  Level 5 caveat due to dementia and AMS.  Patient lives at a facility.  Per EMS, staff last saw patient normal at 730 this morning.  Around 11:00 this morning, staff noticed that patient was sitting in a wheelchair, eyes were open but she was not responding.  At that time, she had right-sided deficits.  By the time EMS arrived, patient was responsive and alert.  She had no deficits at that time.  Per staff, she is normally A&O x4, despite her history of dementia.  No known recent fevers, chills, cough, nausea, vomiting or abdominal pain, urinary symptoms, abnormal bowel movements. Patient states she is feeling well.  She has no pain.  She is no complaints at this time.  She does not remember what happened when staff called EMS.  She does remember breakfast this morning.   Additional history obtained from chart review.  Patient with a history of A. fib on anticoagulation, diabetes, RA, hypothyroidism, previous CVA, renal artery stenosis, PAD, ICA occlusion, CKD, CAD, hypertension, hyperlipidemia, depression.  HPI     Past Medical History:  Diagnosis Date  . Acute pancreatitis   . Anemia   . Arthritis    RA  . Atrial fibrillation (HCC)   . CAD (coronary artery disease)   . Carotid artery occlusion   . CHF (congestive heart failure) (HCC)   . Diabetes mellitus age 57  . DJD (degenerative joint disease)   . DVT (deep venous thrombosis) (HCC)   . GERD (gastroesophageal reflux disease)   . GI bleed   . Hyperlipidemia   . Hypertension   . Joint pain   . Mesenteric ischemia   . Muscle weakness (generalized)   . Peripheral vascular disease (HCC)   . Renal disorder    Stage IV  . Thyroid disease    . Vascular dementia [facility])     Patient Active Problem List   Diagnosis Date Noted  . AKI (acute kidney injury) (HCC) 01/17/2018  . MDD (major depressive disorder) 01/15/2018  . Macrocytic anemia 09/22/2017  . Proteinuria 04/06/2017  . Insomnia 12/01/2016  . CAD (coronary artery disease) 11/17/2016  . Hyperlipidemia 11/17/2016  . Hypertension 11/17/2016  . Aortic atherosclerosis (HCC) 11/03/2016  . CKD (chronic kidney disease) stage 3, GFR 30-59 ml/min (HCC) 04/14/2015  . Colonic constipation 04/14/2015  . Frequent UTI 04/01/2015  . Diabetes mellitus with renal complications (HCC) 03/03/2015  . Atrophic vaginitis 02/24/2015  . Rheumatoid arthritis (HCC) 02/18/2015  . DNR (do not resuscitate) 02/13/2015  . Occlusion and stenosis of carotid artery without mention of cerebral infarction 05/02/2013  . 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 FEMORAL BRUIT 11/24/2009  . Hypothyroidism 06/26/2009  . CEREBROVASCULAR DISEASE 06/25/2009  . ATRIAL FIBRILLATION 05/19/2008  . Chronic diastolic heart failure (HCC) 05/19/2008  . RENAL ARTERY STENOSIS 05/19/2008  . PERIPHERAL VASCULAR DISEASE 05/19/2008    Past Surgical History:  Procedure Laterality Date  . APPENDECTOMY    . CELIAC ARTERY STENT    . CORONARY ARTERY BYPASS GRAFT    . EMBOLECTOMY  2009   right leg  . FASCIOTOMY     right leg  . HIP FRACTURE SURGERY Left   . JOINT REPLACEMENT    .  TOTAL KNEE ARTHROPLASTY     bilateral  . TUBAL LIGATION    . VISCERAL ANGIOGRAM N/A 05/31/2013   Procedure: MESSENTERIC [name];  Surgeon: [name], MD;  Location: [facility] CATH LAB;  Service: Cardiovascular;  Laterality: N/A;     OB History   No obstetric history on file.     Family History  Problem Relation Age of Onset  . Cancer Mother        ovarian  . Other Father        suicide  . Coronary artery disease Sister   . Other Sister        alzheimers  . Heart disease Sister        Before age 65  . Diabetes  Sister   . Cancer Daughter        spinal tumor  . Diabetes Daughter   . Hypertension Daughter   . Diabetes Son   . Hyperlipidemia Son   . Hypertension Son     Social History   Tobacco Use  . Smoking status: Never Smoker  . Smokeless tobacco: Never Used  Vaping Use  . Vaping Use: Never used  Substance Use Topics  . Alcohol use: No  . Drug use: No    Home Medications Prior to Admission medications   Medication Sig Start Date End Date Taking? Authorizing Provider  AMBULATORY NON FORMULARY MEDICATION Please check INR weekly for 2 months. Send reports to Dr Denyse Amass fax [phone] 12/19/16   Rodolph Bong, MD  apixaban (ELIQUIS) 5 MG TABS tablet Take 1 tablet (5 mg total) by mouth 2 (two) times daily. 11/21/17   Lewayne Bunting, MD  Calcium Carb-Cholecalciferol 600-800 MG-UNIT TABS TAKE ONE TABLET BY MOUTH 2 TIMES A DAY Patient taking differently: Take 1 tablet by mouth 2 (two) times daily.  09/11/15   Rodolph Bong, MD  clonazePAM (KLONOPIN) 0.5 MG tablet Take 1 tablet (0.5 mg total) by mouth at bedtime. 01/19/18   Jerald Kief, MD  Cranberry 500 MG CAPS Take 500 mg by mouth daily.     [provider]  D-MANNOSE PO Take 1 tablet by mouth 2 (two) times daily.    [provider]  diltiazem (CARDIZEM CD) 180 MG 24 hr capsule Take 1 capsule (180 mg total) by mouth at bedtime. 05/24/17   Rodolph Bong, MD  Ergocalciferol (VITAMIN D2) 2000 units TABS Take 2,000 Units by mouth daily.     [provider]  Ferrous Bisglycinate Chelate 15 MG TABS Take 1 tablet by mouth 2 (two) times daily. Patient not taking: Reported on 02/12/2018 10/20/17   Rodolph Bong, MD  ferrous sulfate 325 (65 FE) MG tablet Take 325 mg by mouth 2 (two) times daily.     [provider]  FLUoxetine (PROZAC) 20 MG capsule Take 20 mg by mouth daily.    [provider]  folic acid (FOLVITE) 1 MG tablet Take 1 tablet (1 mg total) by mouth daily. 05/24/17   Rodolph Bong, MD  glucose  blood test strip Once daily E11.29 03/03/15   Rodolph Bong, MD  [employer] [facility] EX) Apply 1 application topically 2 (two) times daily. Apply to buttocks    [provider]  levothyroxine (SYNTHROID) 75 MCG tablet Take 75 mcg by mouth daily before breakfast.    [provider]  levothyroxine (SYNTHROID, LEVOTHROID) 50 MCG tablet Take 1 tablet (50 mcg total) by mouth daily. Patient not taking: Reported on 05/19/2019  05/24/17   Rodolph Bong, MD  loperamide (IMODIUM A-D) 2 MG tablet Take 4 mg by mouth See admin instructions. Take  after first loose stool and  after subsequent stool; do not exceed  in 24hr    [provider]  memantine (NAMENDA) 10 MG tablet Take 10 mg by mouth at bedtime.    [provider]  methotrexate (RHEUMATREX) 10 MG tablet Take 20 mg by mouth once a week. On Friday  Caution: Chemotherapy. Protect from light.    [provider]  metoprolol succinate (TOPROL-XL) 25 MG 24 hr tablet Take 25 mg by mouth daily.    [provider]  metoprolol succinate (TOPROL-XL) 50 MG 24 hr tablet Take 1 tablet (50 mg total) by mouth daily. Patient not taking: Reported on 05/19/2019 05/24/17   Rodolph Bong, MD  PARoxetine (PAXIL) 10 MG tablet Take 1 tablet (10 mg total) by mouth daily. Patient not taking: Reported on 02/12/2018 01/16/18   Rodolph Bong, MD  polyethylene glycol (MIRALAX / GLYCOLAX) 17 g packet Take 17 g by mouth daily.    [provider]  pravastatin (PRAVACHOL) 40 MG tablet TAKE 1 TABLET (40 MG TOTAL) BY MOUTH DAILY. Patient taking differently: Take 40 mg by mouth at bedtime.  01/03/18   Rodolph Bong, MD  PREMARIN vaginal cream PLACE 1 APPLICATORFUL VAGINALLY ONCE A DAY Patient taking differently: Place 1 Applicatorful vaginally daily.  12/08/17   Rodolph Bong, MD  STOOL SOFTENER 100 MG capsule TAKE 1 CAPSULE BY MOUTH 2 TIMES A DAY (AM & PM) Patient taking differently: Take 100 mg by mouth 2 (two)  times daily.  09/11/15   Rodolph Bong, MD  torsemide (DEMADEX) 100 MG tablet Take 0.5 tablets (50 mg total) by mouth 2 (two) times daily. 05/24/17   Rodolph Bong, MD  traMADol (ULTRAM) 50 MG tablet Take 1 tablet (50 mg total) by mouth 2 (two) times daily. 01/19/18   Jerald Kief, MD  traZODone (DESYREL) 50 MG tablet Take 0.5-1 tablets (25-50 mg total) by mouth at bedtime as needed for sleep. Patient not taking: Reported on 02/12/2018 12/01/16   Rodolph Bong, MD  vitamin B-12 (CYANOCOBALAMIN) 1000 MCG tablet Take 1,000 mcg by mouth daily.    [provider]    Allergies    Cefuroxime, Fish allergy, Ciprofloxacin, Codeine, Sertraline hcl, Cephalexin, and Paxil [paroxetine hcl]  Review of Systems   Review of Systems  Unable to perform ROS: Mental status change  Neurological: Positive for weakness.  Hematological: Bruises/bleeds easily.  Psychiatric/Behavioral: Positive for confusion.    Physical Exam Updated Vital Signs BP (!) 142/91   Pulse 85   Temp 98.7 F (37.1 C) (Oral)   Resp 18   Ht  (1.575 m)   Wt 66.7 kg   SpO2 93%   BMI 26.90 kg/m   Physical Exam Vitals and nursing note reviewed.  Constitutional:      General: She is not in acute distress.    Appearance: She is well-developed and well-nourished.     Comments: Appears nontoxic  HENT:     Head: Normocephalic and atraumatic.  Eyes:     Extraocular Movements: Extraocular movements intact and EOM normal.     Conjunctiva/sclera: Conjunctivae normal.     Comments: R pupil 1-2 mm larger than L. EOMI  Cardiovascular:     Rate and Rhythm: Normal rate. Rhythm irregular.     Pulses: Normal pulses and intact distal pulses.  Comments: In A. fib, rate controlled Pulmonary:     Effort: Pulmonary effort is normal. No respiratory distress.     Breath sounds: Normal breath sounds. No wheezing.  Abdominal:     General: There is no distension.     Palpations: Abdomen is soft. There is no mass.     Tenderness:  There is no abdominal tenderness. There is no guarding or rebound.  Musculoskeletal:        General: Normal range of motion.     Cervical back: Normal range of motion and neck supple.  Skin:    General: Skin is warm and dry.  Neurological:     Mental Status: She is alert.     GCS: GCS eye subscore is 4. GCS verbal subscore is 5. GCS motor subscore is 6.     Cranial Nerves: Cranial nerves are intact.     Sensory: Sensation is intact.     Motor: Motor function is intact.     Comments: Alert to person and place, not time or event.  No unilateral weakness or decrease in station.  CN intact.  Nose to finger intact.  Psychiatric:        Mood and Affect: Mood and affect normal.     ED Results / Procedures / Treatments   Labs (all labs ordered are listed, but only abnormal results are displayed) Labs Reviewed  CBC WITH DIFFERENTIAL/PLATELET - Abnormal; Notable for the following components:      Result Value   RBC 3.73 (*)    Hemoglobin 11.4 (*)    RDW 16.2 (*)    Monocytes Absolute 1.1 (*)    Abs Immature Granulocytes 0.10 (*)    All other components within normal limits  COMPREHENSIVE METABOLIC PANEL - Abnormal; Notable for the following components:   Glucose, Bld 227 (*)    BUN 44 (*)    Creatinine, Ser 1.59 (*)    Albumin 2.4 (*)    GFR, Estimated 31 (*)    All other components within normal limits  CBG MONITORING, ED - Abnormal; Notable for the following components:   Glucose-Capillary 206 (*)    All other components within normal limits  URINE CULTURE  SARS CORONAVIRUS 2 (TAT 6-24 HRS)  URINALYSIS, ROUTINE W REFLEX MICROSCOPIC    EKG EKG Interpretation  Date/Time:  Wednesday May 13 2020 11:43:24 EST Ventricular Rate:  88 PR Interval:    QRS Duration: 118 QT Interval:  400 QTC Calculation: 484 R Axis:   52 Text Interpretation: Atrial fibrillation LVH with secondary repolarization abnormality No significant change since last tracing Confirmed by Alona Bene  [phone]) on 05/13/2020 11:59:41 AM   Radiology DG Chest 2 View  Result Date: 05/13/2020 CLINICAL DATA:  Confusion. EXAM: CHEST - 2 VIEW COMPARISON:  05/19/2019 FINDINGS: Osteopenia.  Thoracic vertebral body height loss at multiple levels. Midline trachea. Moderate cardiomegaly. Tortuous thoracic aorta. Atherosclerosis in the transverse aorta. Prior median sternotomy. No pleural effusion or pneumothorax. Chronic mild interstitial thickening, without overt congestive failure. No lobar consolidation. IMPRESSION: No acute cardiopulmonary disease. Cardiomegaly without congestive failure. Aortic Atherosclerosis (ICD10-I70.0). Electronically Signed   By: Jeronimo Greaves M.D.   On: 05/13/2020 13:38   CT Head Wo Contrast  Result Date: 05/13/2020 CLINICAL DATA:  Altered mental status. EXAM: CT HEAD WITHOUT CONTRAST TECHNIQUE: Contiguous axial images were obtained from the base of the skull through the vertex without intravenous contrast. COMPARISON:  May 19, 2019. FINDINGS: Brain: Mild chronic ischemic white matter disease is noted. No  mass effect or midline shift is noted. Ventricular size is within normal limits. There is no evidence of mass lesion, hemorrhage or acute infarction. Vascular: No hyperdense vessel or unexpected calcification. Skull: Normal. Negative for fracture or focal lesion. Sinuses/Orbits: No acute finding. Other: None. IMPRESSION: Mild chronic ischemic white matter disease. No acute intracranial abnormality seen. Electronically Signed   By: Lupita Raider M.D.   On: 05/13/2020 13:02    Procedures Procedures   Medications Ordered in ED Medications - No data to display  ED Course  I have reviewed the triage vital signs and the nursing notes.  Pertinent labs & imaging results that were available during my care of the patient were reviewed by me and considered in my medical decision making (see chart for details).    MDM Rules/Calculators/A&P                          Patient  presenting for evaluation of altered mental status and right-sided weakness.  On evaluation, mental status has improved and weakness has resolved.  However, patient remains slightly confused, not at her baseline orientation.  Otherwise, no obvious neuro deficits.  Right pupil slightly bigger than left, no known trauma.  However, will obtain CT imaging as patient is on a blood thinner.  Will obtain labs to look for metabolic abnormality.  Will check chest x-ray and urine to ensure no infection.  If negative, symptoms are likely due to a TIA.  CT head negative for acute findings.  Labs interpreted by me, overall reassuring.  Chest x-ray without obvious signs of infection including pneumonia.  Urine is pending.  Discussed with Dr. Thomasena Edis from neurology, who will evaluate the patient.  As patient likely had a TIA, and ABCD score is 4, he recommended admission for TIA work-up.  Discussed with Dr. Ophelia Charter from triad hospitalist service, patient to be admitted.   Final Clinical Impression(s) / ED Diagnoses Final diagnoses:  TIA (transient ischemic attack)    Rx / DC Orders ED Discharge Orders    None       Alveria Apley, PA-C 05/13/20 1446    Long, Arlyss Repress, MD 05/14/20 1008

## 2020-05-13 NOTE — ED Notes (Signed)
Pt continues to be at MRI.

## 2020-05-13 NOTE — Progress Notes (Signed)
Carotid duplex has been completed.   Preliminary results in CV Proc.   Blanch Media 05/13/2020 3:44 PM

## 2020-05-13 NOTE — ED Notes (Signed)
Patient transported to MRI 

## 2020-05-13 NOTE — ED Notes (Signed)
Pt to mri 

## 2020-05-13 NOTE — H&P (Signed)
- History and Physical    [name] ZDG:[id number] DOB: 1930/01/25 DOA: 05/13/2020  PCP: [facility] Consultants:  Threasa Beards - podiatry; Ileene Hutchinson - urology Patient coming from: [facility]; NOK: Son, [phone] or [phone]  Chief Complaint: AMS  HPI: [name] is a 87 y.o. female with medical history significant of vascular dementia; hypothyroidism; stage 4 CKD; HTN; HLD; DM; afib on Eliquis; RA; CAD; and chronic diastolic CHF presenting with AMS.  [facility] called her son - found her unresponsive this AM.  Her R eye was fixated,  R mouth drooping.  Symptoms were resolved by the time he arrived.  BP was high, about 155/115, which was unusual for her.  Waxing and waning and mental status at baseline.    ED Course:  Neurology recommends TIA work-up, ABCD2 score 4.  Neurology is consulted.  MRI is pending.  AMS with R-sided weakness.    Review of Systems: As per HPI; otherwise review of systems reviewed and negative.   Ambulatory Status:  Ambulates with a walker, but usually uses a wheelchair  COVID Vaccine Status:  Complete  Past Medical History:  Diagnosis Date  . Acute pancreatitis   . Anemia   . Arthritis    RA  . Atrial fibrillation (HCC)   . CAD (coronary artery disease)   . Carotid artery occlusion   . CHF (congestive heart failure) (HCC)   . Diabetes mellitus age 58  . DJD (degenerative joint disease)   . DVT (deep venous thrombosis) (HCC)   . GERD (gastroesophageal reflux disease)   . GI bleed   . Hyperlipidemia   . Hypertension   . Joint pain   . Mesenteric ischemia   . Muscle weakness (generalized)   . Peripheral vascular disease (HCC)   . Renal disorder    Stage IV  . Thyroid disease   . Vascular dementia [facility])     Past Surgical History:  Procedure Laterality Date  . APPENDECTOMY    . CELIAC ARTERY STENT    . CORONARY ARTERY BYPASS GRAFT    . EMBOLECTOMY  2009   right leg  . FASCIOTOMY     right leg  . HIP FRACTURE SURGERY Left    . JOINT REPLACEMENT    . TOTAL KNEE ARTHROPLASTY     bilateral  . TUBAL LIGATION    . VISCERAL ANGIOGRAM N/A 05/31/2013   Procedure: MESSENTERIC [name];  Surgeon: [name], MD;  Location: [facility] CATH LAB;  Service: Cardiovascular;  Laterality: N/A;    Social History   Socioeconomic History  . Marital status: Widowed    Spouse name: Not on file  . Number of children: Not on file  . Years of education: Not on file  . Highest education level: Not on file  Occupational History  . Not on file  Tobacco Use  . Smoking status: Never Smoker  . Smokeless tobacco: Never Used  Vaping Use  . Vaping Use: Never used  Substance and Sexual Activity  . Alcohol use: No  . Drug use: No  . Sexual activity: Not on file  Other Topics Concern  . Not on file  Social History Narrative  . Not on file   Social Determinants of Health   Financial Resource Strain: Not on file  Food Insecurity: Not on file  Transportation Needs: Not on file  Physical Activity: Not on file  Stress: Not on file  Social Connections: Not on file  Intimate Partner Violence: Not on file    Allergies  Allergen Reactions  . Cefuroxime Anaphylaxis and Other (See Comments)    Throat swelling  . Fish Allergy Shortness Of Breath    Pt said she has throat swelling   . Ciprofloxacin Rash  . Codeine Other (See Comments)    Jittery  . Sertraline Hcl Rash  . Cephalexin Rash  . Paxil [Paroxetine Hcl] Other (See Comments)    Delirium while in hospital reports of hallucinations.    Family History  Problem Relation Age of Onset  . Cancer Mother        ovarian  . Other Father        suicide  . Coronary artery disease Sister   . Other Sister        alzheimers  . Heart disease Sister        Before age 53  . Diabetes Sister   . Cancer Daughter        spinal tumor  . Diabetes Daughter   . Hypertension Daughter   . Diabetes Son   . Hyperlipidemia Son   . Hypertension Son     Prior to Admission medications    Medication Sig Start Date End Date Taking? Authorizing Provider  AMBULATORY NON FORMULARY MEDICATION Please check INR weekly for 2 months. Send reports to Dr Denyse Amass fax [phone] 12/19/16   Rodolph Bong, MD  apixaban (ELIQUIS) 5 MG TABS tablet Take 1 tablet (5 mg total) by mouth 2 (two) times daily. 11/21/17   Lewayne Bunting, MD  Calcium Carb-Cholecalciferol 600-800 MG-UNIT TABS TAKE ONE TABLET BY MOUTH 2 TIMES A DAY Patient taking differently: Take 1 tablet by mouth 2 (two) times daily.  09/11/15   Rodolph Bong, MD  clonazePAM (KLONOPIN) 0.5 MG tablet Take 1 tablet (0.5 mg total) by mouth at bedtime. 01/19/18   Jerald Kief, MD  Cranberry 500 MG CAPS Take 500 mg by mouth daily.     [provider]  D-MANNOSE PO Take 1 tablet by mouth 2 (two) times daily.    [provider]  diltiazem (CARDIZEM CD) 180 MG 24 hr capsule Take 1 capsule (180 mg total) by mouth at bedtime. 05/24/17   Rodolph Bong, MD  Ergocalciferol (VITAMIN D2) 2000 units TABS Take 2,000 Units by mouth daily.     [provider]  Ferrous Bisglycinate Chelate 15 MG TABS Take 1 tablet by mouth 2 (two) times daily. Patient not taking: Reported on 02/12/2018 10/20/17   Rodolph Bong, MD  ferrous sulfate 325 (65 FE) MG tablet Take 325 mg by mouth 2 (two) times daily.     [provider]  FLUoxetine (PROZAC) 20 MG capsule Take 20 mg by mouth daily.    [provider]  folic acid (FOLVITE) 1 MG tablet Take 1 tablet (1 mg total) by mouth daily. 05/24/17   Rodolph Bong, MD  glucose blood test strip Once daily E11.29 03/03/15   Rodolph Bong, MD  Infant Care Products [facility] EX) Apply 1 application topically 2 (two) times daily. Apply to buttocks    [provider]  levothyroxine (SYNTHROID) 75 MCG tablet Take 75 mcg by mouth daily before breakfast.    [provider]  levothyroxine (SYNTHROID, LEVOTHROID) 50 MCG tablet Take 1 tablet (50 mcg total) by mouth  daily. Patient not taking: Reported on 05/19/2019 05/24/17   Rodolph Bong, MD  loperamide (IMODIUM A-D) 2 MG tablet Take 4 mg by mouth See admin instructions. Take  after first loose stool and  2mg  after subsequent stool; do not exceed 16mg  in 24hr    [provider]  memantine (NAMENDA) 10 MG tablet Take 10 mg by mouth at bedtime.    [provider]  methotrexate (RHEUMATREX) 10 MG tablet Take 20 mg by mouth once a week. On Friday  Caution: Chemotherapy. Protect from light.    [provider]  metoprolol succinate (TOPROL-XL) 25 MG 24 hr tablet Take 25 mg by mouth daily.    [provider]  metoprolol succinate (TOPROL-XL) 50 MG 24 hr tablet Take 1 tablet (50 mg total) by mouth daily. Patient not taking: Reported on 05/19/2019 05/24/17   07/17/2019, MD  PARoxetine (PAXIL) 10 MG tablet Take 1 tablet (10 mg total) by mouth daily. Patient not taking: Reported on 02/12/2018 01/16/18   13/07/2017, MD  polyethylene glycol (MIRALAX / GLYCOLAX) 17 g packet Take 17 g by mouth daily.    [provider]  pravastatin (PRAVACHOL) 40 MG tablet TAKE 1 TABLET (40 MG TOTAL) BY MOUTH DAILY. Patient taking differently: Take 40 mg by mouth at bedtime.  01/03/18   Rodolph Bong, MD  PREMARIN vaginal cream PLACE 1 APPLICATORFUL VAGINALLY ONCE A DAY Patient taking differently: Place 1 Applicatorful vaginally daily.  12/08/17   Rodolph Bong, MD  STOOL SOFTENER 100 MG capsule TAKE 1 CAPSULE BY MOUTH 2 TIMES A DAY (AM & PM) Patient taking differently: Take 100 mg by mouth 2 (two) times daily.  09/11/15   Rodolph Bong, MD  torsemide (DEMADEX) 100 MG tablet Take 0.5 tablets (50 mg total) by mouth 2 (two) times daily. 05/24/17   Rodolph Bong, MD  traMADol (ULTRAM) 50 MG tablet Take 1 tablet (50 mg total) by mouth 2 (two) times daily. 01/19/18   Rodolph Bong, MD  traZODone (DESYREL) 50 MG tablet Take 0.5-1 tablets (25-50 mg total) by mouth at bedtime as needed for  sleep. Patient not taking: Reported on 02/12/2018 12/01/16   13/07/2017, MD  vitamin B-12 (CYANOCOBALAMIN) 1000 MCG tablet Take 1,000 mcg by mouth daily.    [provider]    Physical Exam: Vitals:   05/13/20 1415 05/13/20 1430 05/13/20 1445 05/13/20 1500  BP: (!) 143/75 (!) 142/91 135/69 (!) 148/81  Pulse: 87 85 90 93  Resp: (!) 21 18 17  (!) 22  Temp:      TempSrc:      SpO2: 92% 93% 94% 93%  Weight:      Height:         . General:  Appears calm and comfortable and is in NAD, not particularly engaged . Eyes:  PERRL but with R anisocoria, EOMI, normal lids, iris . ENT:  grossly normal hearing, lips & tongue, mmm; artificial dentition . Neck:  no LAD, masses or thyromegaly; no carotid bruits . Cardiovascular:  Irregularly irregular without tachycardia, no m/r/g. No LE edema.  07/11/20 Respiratory:   CTA bilaterally with no wheezes/rales/rhonchi.  Normal respiratory effort. . Abdomen:  soft, NT, ND, NABS . Skin:  no rash or induration seen on limited exam . Musculoskeletal:  Mildly decreased strength LUE, good ROM, chronic hand deformities B from RA . Psychiatric:  Detached and blunted mood and affect, speech sparse but appropriate, AOx1 . Neurologic:  CN 2-12 grossly intact, moves all extremities in coordinated fashion, sensation intact    Radiological Exams on Admission: Independently reviewed - see discussion in A/P where applicable  DG Chest 2 View  Result Date: 05/13/2020  CLINICAL DATA:  Confusion. EXAM: CHEST - 2 VIEW COMPARISON:  05/19/2019 FINDINGS: Osteopenia.  Thoracic vertebral body height loss at multiple levels. Midline trachea. Moderate cardiomegaly. Tortuous thoracic aorta. Atherosclerosis in the transverse aorta. Prior median sternotomy. No pleural effusion or pneumothorax. Chronic mild interstitial thickening, without overt congestive failure. No lobar consolidation. IMPRESSION: No acute cardiopulmonary disease. Cardiomegaly without congestive failure. Aortic  Atherosclerosis (ICD10-I70.0). Electronically Signed   By: Jeronimo Greaves M.D.   On: 05/13/2020 13:38   CT Head Wo Contrast  Result Date: 05/13/2020 CLINICAL DATA:  Altered mental status. EXAM: CT HEAD WITHOUT CONTRAST TECHNIQUE: Contiguous axial images were obtained from the base of the skull through the vertex without intravenous contrast. COMPARISON:  May 19, 2019. FINDINGS: Brain: Mild chronic ischemic white matter disease is noted. No mass effect or midline shift is noted. Ventricular size is within normal limits. There is no evidence of mass lesion, hemorrhage or acute infarction. Vascular: No hyperdense vessel or unexpected calcification. Skull: Normal. Negative for fracture or focal lesion. Sinuses/Orbits: No acute finding. Other: None. IMPRESSION: Mild chronic ischemic white matter disease. No acute intracranial abnormality seen. Electronically Signed   By: Lupita Raider M.D.   On: 05/13/2020 13:02    EKG: Independently reviewed.  Afib with rate 88; LVH; NSCSLT  Labs on Admission: I have personally reviewed the available labs and imaging studies at the time of the admission.  Pertinent labs:   Glucose 227 BUN 44/Creatinine 1.59/GFR 31; 26/1.02/49 on 05/19/19 Albumin 2.4 WBC 8.8 Hgb 11.4; 12.0 on 05/19/19 COVID pending   Assessment/Plan Principal Problem:   TIA (transient ischemic attack) Active Problems:   Hypothyroidism   ATRIAL FIBRILLATION   Chronic diastolic heart failure (HCC)   DNR (do not resuscitate)   Rheumatoid arthritis (HCC)   Diabetes mellitus with renal complications (HCC)   CKD (chronic kidney disease) stage 3, GFR 30-59 ml/min (HCC)   Hyperlipidemia   Hypertension   Vascular dementia, uncomplicated (HCC)   TIA -Patient with acute onset of AMS (different from baseline) with apparent R anisocoria (still present - reactive but larger than L); R facial droop (resolved); now with ?LUE weakness, subtle -Concerning for TIA -ABCD2 score is 4 -Aspirin has been  given to reduce stroke mortality and decrease morbidity -Will place in observation status for CVA/TIA evaluation -Telemetry monitoring -MRI/MRA -Carotid dopplers; if ipsilateral carotid stenosis is detected then prompt vascular surgery consultation is needed for consideration of CEA. -Echo -Risk stratification with FLP, A1c; will also check TSH -Patient will need DAPT for 21 days when ABCD2 score is at least 4 and NIH score is 3 or less, and then can transition to monotherapy with a single antiplatelet agent.  Will defer to neurology for now. -Neurology consult -PT/OT/ST/Nutrition Consults  HTN -Allow permissive HTN for now -Treat BP only if >220/120, and then with goal of 15% reduction -Hold Toprol XL and Cardizem and plan to restart in 48-72 hours   HLD -Check FLP -Resume statin but will change Pravachol to Lipitor 40 mg daily   DM -Check A1c -Appears to be diet controlled -Will order sensitive-scale SSI  Advanced CKD -Documentation in the chart indicates stage 4 CKD -Currently 3b -Will trend -Attempt to avoid nephrotoxic medications where possible  Afib -Rate controlled with Cardizem, Toprol XL - holding for now for permissive HTN -Continue Eliquis  Dementia -Continue Prozac, Namenda, Remeron  Hypothyroidism -Check TSH -Continue Synthroid at current dose for now  RA -Due for Methotrexate again on Friday - hold for now  DNR -I have discussed code status with the patient and her son and  they are in agreement that the patient would not desire resuscitation and would prefer to die a natural death should that situation arise. -She will need a gold out of facility DNR form at the time of discharge      Note: This patient has been tested and is pending for the novel coronavirus COVID-19. She has been fully vaccinated against COVID-19.    DVT prophylaxis:  Eliquis Code Status: DNR - confirmed with patient/family Family Communication: Son present throughout  evaluation Disposition Plan:  The patient is from: home  Anticipated d/c is to: home without [facility] services  Anticipated d/c date will depend on clinical response to treatment, but possibly as early as tomorrow if she has excellent response to treatment  Patient is currently: acutely ill Consults called: Neurology; PT/OT/ST/Nutrition; [facility] team Admission status: It is my clinical opinion that referral for OBSERVATION is reasonable and necessary in this patient based on the above information provided. The aforementioned taken together are felt to place the patient at high risk for further clinical deterioration. However it is anticipated that the patient may be medically stable for discharge from the hospital within 24 to 48 hours.     Jonah Blue MD [facility]   How to contact the [facility] Attending or Consulting provider 7A - 7P or covering provider during after hours 7P -7A, for this patient?  1. Check the care team in [facility] and look for a) attending/consulting [facility] provider listed and b) the [facility] team listed 2. Log into [email or web address] and use [facility] universal password to access. If you do not have the password, please contact the hospital operator. 3. Locate the [facility] provider you are looking for under [facility] and page to a number that you can be directly reached. 4. If you still have difficulty reaching the provider, please page the [facility] (Director on Call) for the Hospitalists listed on amion for assistance.   05/13/2020, 3:23 PM

## 2020-05-13 NOTE — ED Triage Notes (Signed)
Pt here via [facility] from [facility].  They stated she was LSN at 0730.  Approx 11 staff noted she was sitting in her wheelchair, with eyes open, not responding.  They called [facility] approx 30 min later.  PT was responsive and alert.  Hx of dementia so could not state year, though could follow commands.  Staff has stated pt had R sided deficits, however none noted presently.  cbg 326 bp 140 sys Hr 84 afib (hx of ) rr 24 sats 94% RA

## 2020-05-13 NOTE — ED Notes (Signed)
Attempted to give report, asked to call back in 5 min

## 2020-05-13 NOTE — Consult Note (Signed)
Neurology Consultation  Reason for Consult: Transient episode of unresponsivness with right mouth droop at SNF Referring Physician: Dr. Ophelia Charter  CC: Transient episode of unresponsivness with right mouth droop at SNF  History is obtained from: chart review, EDP, son at bedside  HPI: [name] is a 87 y.o. female with a history significant for vascular dementia, hypothyroidism, stage IV chronic kidney disease, hypertension, hyperlipidemia, atrial fibrillation on Eliquis, coronary artery disease, and chronic diastolic heart failure who presented to [facility] today 05/13/20 by EMS for evaluation after an episode this morning of unresponsiveness with eyes open and right eye fixation and right-sided facial droop.   LKW: 0730 tpa given?: no, symptom resolution on arrival to hospital  ROS: A 14 point ROS was performed and is negative except as noted in the HPI.   Past Medical History:  Diagnosis Date  . Acute pancreatitis   . Anemia   . Arthritis    RA  . Atrial fibrillation (HCC)   . CAD (coronary artery disease)   . Carotid artery occlusion   . CHF (congestive heart failure) (HCC)   . Diabetes mellitus age 7  . DJD (degenerative joint disease)   . DVT (deep venous thrombosis) (HCC)   . GERD (gastroesophageal reflux disease)   . GI bleed   . Hyperlipidemia   . Hypertension   . Joint pain   . Mesenteric ischemia   . Muscle weakness (generalized)   . Peripheral vascular disease (HCC)   . Renal disorder    Stage IV  . Thyroid disease   . Vascular dementia (HCC)    Family History  Problem Relation Age of Onset  . Cancer Mother        ovarian  . Other Father        suicide  . Coronary artery disease Sister   . Other Sister        alzheimers  . Heart disease Sister        Before age 53  . Diabetes Sister   . Cancer Daughter        spinal tumor  . Diabetes Daughter   . Hypertension Daughter   . Diabetes Son   . Hyperlipidemia Son   . Hypertension Son    Social History:    reports that she has never smoked. She has never used smokeless tobacco. She reports that she does not drink alcohol and does not use drugs.   Past Surgical History:  Procedure Laterality Date  . APPENDECTOMY    . CELIAC ARTERY STENT    . CORONARY ARTERY BYPASS GRAFT    . EMBOLECTOMY  2009   right leg  . FASCIOTOMY     right leg  . HIP FRACTURE SURGERY Left   . JOINT REPLACEMENT    . TOTAL KNEE ARTHROPLASTY     bilateral  . TUBAL LIGATION    . VISCERAL ANGIOGRAM N/A 05/31/2013   Procedure: MESSENTERIC [name];  Surgeon: [name], MD;  Location: [facility] CATH LAB;  Service: Cardiovascular;  Laterality: N/A;   Medications Current Outpatient Medications  Medication Instructions  . AMBULATORY NON FORMULARY MEDICATION Please check INR weekly for 2 months. Send reports to Dr Denyse Amass fax [phone]  . apixaban (ELIQUIS) 5 mg, Oral, 2 times daily  . Calcium Carb-Cholecalciferol 600-800 MG-UNIT TABS TAKE ONE TABLET BY MOUTH 2 TIMES A DAY  . clonazePAM (KLONOPIN) 0.5 mg, Oral, Daily at bedtime  . Cranberry 500 mg, Oral, Daily  . D-MANNOSE PO 1 tablet, Oral, 2  times daily  . diltiazem (CARDIZEM CD) 180 mg, Oral, Daily at bedtime  . Ferrous Bisglycinate Chelate 15 MG TABS 1 tablet, Oral, 2 times daily  . ferrous sulfate 325 mg, Oral, 2 times daily  . FLUoxetine (PROZAC) 20 mg, Oral, Daily  . folic acid (FOLVITE) 1 mg, Oral, Daily  . glucose blood test strip Once daily<BR>E11.29  . Infant Care Products [facility] EX) 1 application, Apply externally, 2 times daily, Apply to buttocks  . levothyroxine (SYNTHROID) 50 mcg, Oral, Daily  . levothyroxine (SYNTHROID) 75 mcg, Oral, Daily before breakfast  . loperamide (IMODIUM A-D) 4 mg, Oral, See admin instructions, Take  after first loose stool and  after subsequent stool; do not exceed  in 24hr  . memantine (NAMENDA) 10 mg, Oral, Daily at bedtime  . methotrexate (RHEUMATREX) 20 mg, Oral, Weekly, On Friday <BR>Caution:  Chemotherapy. Protect from light.  . metoprolol succinate (TOPROL-XL) 50 mg, Oral, Daily  . metoprolol succinate (TOPROL-XL) 25 mg, Oral, Daily  . PARoxetine (PAXIL) 10 mg, Oral, Daily  . polyethylene glycol (MIRALAX / GLYCOLAX) 17 g, Oral, Daily  . pravastatin (PRAVACHOL) 40 mg, Oral, Daily  . PREMARIN vaginal cream PLACE 1 APPLICATORFUL VAGINALLY ONCE A DAY  . STOOL SOFTENER 100 MG capsule TAKE 1 CAPSULE BY MOUTH 2 TIMES A DAY (AM & PM)  . torsemide (DEMADEX) 50 mg, Oral, 2 times daily  . traMADol (ULTRAM) 50 mg, Oral, 2 times daily  . traZODone (DESYREL) 25-50 mg, Oral, At bedtime PRN  . vitamin B-12 (CYANOCOBALAMIN) 1,000 mcg, Oral, Daily  . Vitamin D2 2,000 Units, Oral, Daily   Exam: Current vital signs: BP (!) 148/81   Pulse 93   Temp 98.7 F (37.1 C) (Oral)   Resp (!) 22   Ht  (1.575 m)   Wt 66.7 kg   SpO2 93%   BMI 26.90 kg/m  Vital signs in last 24 hours: Temp:  [98.7 F (37.1 C)] 98.7 F (37.1 C) (02/02 1146) Pulse Rate:  [41-93] 93 (02/02 1500) Resp:  [15-29] 22 (02/02 1500) BP: (120-148)/(57-91) 148/81 (02/02 1500) SpO2:  [92 %-94 %] 93 % (02/02 1500) Weight:  [66.7 kg] 66.7 kg (02/02 1146)  GENERAL: awake, alert, laying in bed, no acute distress HEENT: - Normocephalic and atraumatic, dry mm LUNGS - Normal respiratory effort, non-labored breathing CV - atrial fibrillation on cardiac monitor  ABDOMEN - Soft, non-tender, non-distended Ext: warm, well perfused  NEURO:  Mental Status: alert and oriented to person and place but is amnestic to event today. Patient is not oriented to time and is unable to accurately state year or age. Speech/Language: speech is without aphasia or dysarthria.  Naming and fluency intact. Comprehension not intact per baseline history of vascular dementia.   Cranial Nerves:  II: PERRL 3 mm/brisk. Visual fields full.  III, IV, VI: EOMI. Lid elevation symmetric and full without ptosis.   V: Sensation is intact and symmetrical to  face.  VII: Face symmetric resting and smiling. Able to puff cheeks and raise eyebrows.  VIII: Hearing intact to voice IX, X: Phonation normal.  XI: Normal sternocleidomastoid and trapezius muscle strength XII: Tongue protrudes midline without fasciculations.    Motor: 4/5 strength is all muscle groups without pronator drift of extremities. Tone is normal. Bulk is normal.  Sensation- Intact to light touch bilaterally in all four extremities. Extinction intact. Coordination: FTN intact bilaterally. Gait- deferred  1a Level of Conscious.: 0 1b LOC Questions: 1 1c LOC Commands: 0 2 Best Gaze:  0 3 Visual: 0 4 Facial Palsy: 0 5a Motor Arm - left: 0 5b Motor Arm - Right: 0 6a Motor Leg - Left: 0 6b Motor Leg - Right: 0 7 Limb Ataxia: 0 8 Sensory: 0 9 Best Language: 0 10 Dysarthria: 0 11 Extinct. and Inatten.: 0 TOTAL: 1  Labs I have reviewed labs in epic and the results pertinent to this consultation are: CBC    Component Value Date/Time   WBC 8.8 05/13/2020 1229   RBC 3.73 (L) 05/13/2020 1229   HGB 11.4 (L) 05/13/2020 1229   HCT 36.7 05/13/2020 1229   PLT 328 05/13/2020 1229   MCV 98.4 05/13/2020 1229   MCV 79 05/19/2014 0000   MCH 30.6 05/13/2020 1229   MCHC 31.1 05/13/2020 1229   RDW 16.2 (H) 05/13/2020 1229   LYMPHSABS 2.2 05/13/2020 1229   MONOABS 1.1 (H) 05/13/2020 1229   EOSABS 0.2 05/13/2020 1229   BASOSABS 0.1 05/13/2020 1229  CMP     Component Value Date/Time   NA 137 05/13/2020 1229   NA 138 12/12/2017 0000   K 4.1 05/13/2020 1229   CL 99 05/13/2020 1229   CO2 27 05/13/2020 1229   GLUCOSE 227 (H) 05/13/2020 1229   BUN 44 (H) 05/13/2020 1229   BUN 21 12/12/2017 0000   CREATININE 1.59 (H) 05/13/2020 1229   CREATININE 1.49 (H) 09/21/2017 1549   CALCIUM 9.3 05/13/2020 1229   PROT 7.7 05/13/2020 1229   ALBUMIN 2.4 (L) 05/13/2020 1229   ALBUMIN 3.8 02/13/2015 0000   AST 22 05/13/2020 1229   ALT 11 05/13/2020 1229   ALKPHOS 71 05/13/2020 1229   BILITOT  0.5 05/13/2020 1229   GFRNONAA 31 (L) 05/13/2020 1229   GFRNONAA 32 (L) 09/21/2017 1549   GFRAA 57 (L) 05/19/2019 1734   GFRAA 37 (L) 09/21/2017 1549  Lipid Panel     Component Value Date/Time   CHOL 142 11/19/2016 0258   TRIG 242 (H) 11/19/2016 0258   HDL 25 (L) 11/19/2016 0258   CHOLHDL 5.7 11/19/2016 0258   VLDL 48 (H) 11/19/2016 0258   LDLCALC 69 11/19/2016 0258   Imaging I have reviewed the images obtained: CT-scan of the brain IMPRESSION: Mild chronic ischemic white matter disease. No acute intracranial abnormality seen.  MRI examination of the brain pending.  Impression: 87 year old female with multiple risk factors for stroke as above who presented to the ED today with concern for unresponsiveness, right facial droop, and fixed right eye this morning at skilled nursing facility with resolution prior to hospital arrival. Ms. Pippins does not recall the event and has no complaints on neurology assessment in the ED and per her son at bedside, she is currently at her baseline mental status. CT without evidence of acute intracranial abnormality.   The patient has significant vascular risk factors (s/p CABG, CAD, hypertension, hyperlipidemia) and has atrial fibrillation; therefore, will need to rule out TIA/stroke. Also in the differential diagnosis is possible seizure and EEG ordered.  Recommendations: - EEG to evaluate for epileptiform discharges. - MRI brain, MR angio head pending - Stroke labs: Lipid panel, Hemoglobin A1c pending - HgbA1c, fasting lipid panel - Frequent neuro checks  - Echocardiogram complete 05/13/20 - Prophylactic therapy; continue home AC  - Risk factor modification - Telemetry monitoring - PT consult, OT consult, Speech consult - Stroke team to follow   Marisue Humble, MD Page: [phone]

## 2020-05-13 NOTE — ED Notes (Signed)
At change of shift, pt provided bed update, vss on monitor, no further questions asked at this time. Son at bedside, pt watching tv. NADN, call bell in reach, side rails up.

## 2020-05-13 NOTE — Progress Notes (Signed)
Pt is >87 yo and scr>1.5 right now due to AKI. Ok to reduce apixaban to 2.5mg  BID for her afib per Dr. Ophelia Charter.  Ulyses Southward, PharmD, BCIDP, AAHIVP, CPP Infectious Disease Pharmacist 05/13/2020 6:36 PM

## 2020-05-14 ENCOUNTER — Observation Stay (HOSPITAL_COMMUNITY): Payer: Medicare PPO

## 2020-05-14 ENCOUNTER — Ambulatory Visit (HOSPITAL_COMMUNITY): Payer: Medicare PPO

## 2020-05-14 ENCOUNTER — Observation Stay (HOSPITAL_BASED_OUTPATIENT_CLINIC_OR_DEPARTMENT_OTHER): Payer: Medicare PPO

## 2020-05-14 DIAGNOSIS — G459 Transient cerebral ischemic attack, unspecified: Secondary | ICD-10-CM

## 2020-05-14 DIAGNOSIS — I639 Cerebral infarction, unspecified: Secondary | ICD-10-CM | POA: Diagnosis not present

## 2020-05-14 LAB — LIPID PANEL
Cholesterol: 99 mg/dL (ref 0–200)
HDL: 27 mg/dL — ABNORMAL LOW (ref >40)
LDL Cholesterol: 57 mg/dL (ref 0–99)
Total CHOL/HDL Ratio: 3.7 RATIO
Triglycerides: 74 mg/dL (ref <150)
VLDL: 15 mg/dL (ref 0–40)

## 2020-05-14 LAB — GLUCOSE, CAPILLARY
Glucose-Capillary: 115 mg/dL — ABNORMAL HIGH (ref 70–99)
Glucose-Capillary: 149 mg/dL — ABNORMAL HIGH (ref 70–99)
Glucose-Capillary: 143 mg/dL — ABNORMAL HIGH (ref 70–99)
Glucose-Capillary: 296 mg/dL — ABNORMAL HIGH (ref 70–99)

## 2020-05-14 LAB — ECHOCARDIOGRAM COMPLETE
AR max vel: 0.79 cm2
AV Area VTI: 0.64 cm2
AV Area mean vel: 0.68 cm2
AV Mean grad: 33.8 mmHg
AV Peak grad: 53.9 mmHg
Ao pk vel: 3.67 m/s
Area-P 1/2: 5.86 cm2
Height: 62 in
MV M vel: 5.66 m/s
MV Peak grad: 128.1 mmHg
P 1/2 time: 228 msec
S' Lateral: 3.5 cm
Weight: 2352.75 oz

## 2020-05-14 LAB — HEMOGLOBIN A1C
Hgb A1c MFr Bld: 7.6 % — ABNORMAL HIGH (ref 4.8–5.6)
Mean Plasma Glucose: 171.42 mg/dL

## 2020-05-14 MED ORDER — TRAMADOL HCL 50 MG PO TABS: 50.0000 mg | ORAL_TABLET | Freq: Two times a day (BID) | ORAL | 0 refills | Status: AC

## 2020-05-14 MED ORDER — DABIGATRAN ETEXILATE MESYLATE 75 MG PO CAPS
75.0000 mg | ORAL_CAPSULE | Freq: Two times a day (BID) | ORAL | Status: DC
  Administered 2020-05-14 – 2020-05-15 (×2): 75 mg via ORAL
  Filled 2020-05-14 (×3): qty 1

## 2020-05-14 MED ORDER — ENSURE ENLIVE PO LIQD
237.0000 mL | Freq: Two times a day (BID) | ORAL | Status: DC
  Administered 2020-05-14 – 2020-05-15 (×2): 237 mL via ORAL

## 2020-05-14 MED ORDER — METHENAMINE MANDELATE 1 G PO TABS
500.0000 mg | ORAL_TABLET | Freq: Two times a day (BID) | ORAL | Status: DC
Start: 2020-05-14 — End: 2020-05-15
  Administered 2020-05-14 – 2020-05-15 (×3): 500 mg via ORAL
  Filled 2020-05-14 (×4): qty 1

## 2020-05-14 NOTE — Progress Notes (Signed)
PROGRESS NOTE    [name]  HQI:[id number] DOB: 1929-11-18 DOA: 05/13/2020 PCP: [name], MD    Brief Narrative:  87 year old female with history of vascular dementia, hypothyroidism, stage IV chronic kidney disease, hypertension, hyperlipidemia, type 2 diabetes on insulin, chronic A. fib on Eliquis, rheumatoid arthritis on methotrexate presented from long-term nursing home with altered mental status, right-sided weakness.  She is a long-term care resident at [facility].  She was found unresponsive and her right eye was fixated.  She also suffers from recurrent UTI.  Waxing and waning mental status at baseline. By the time she arrived to emergency room most of her symptoms have improved.  Admitted to the hospital with acute a stroke.   Assessment & Plan:   Principal Problem:   TIA (transient ischemic attack) Active Problems:   Hypothyroidism   ATRIAL FIBRILLATION   Chronic diastolic heart failure (HCC)   DNR (do not resuscitate)   Rheumatoid arthritis (HCC)   Diabetes mellitus with renal complications (HCC)   CKD (chronic kidney disease) stage 3, GFR 30-59 ml/min (HCC)   Hyperlipidemia   Hypertension   Vascular dementia, uncomplicated (HCC)  Acute left MCA territory ischemic stroke: Clinical findings, transient unresponsiveness, right hemiparesis that is quickly resolved. CT head findings, initial CT scan negative. MRI of the brain, left MCA territory ischemic stroke, ACA territory stroke Carotid Doppler, 40-59 percent stenosis bilateral 2D echocardiogram, normal ejection fraction.  No source of embolism. Antiplatelet therapy, on Eliquis at home. Exploring possibility to changing to Pradaxa. LDL 57.  At goal.  Appropriately on statin. Hemoglobin A1c 7.6.  Goal is less than 7.  Continue current doses of insulin. DVT prophylaxis, on Lovenox. Therapy recommendations, skilled nursing facility. Continue to work with PT OT.  Hypertension: Blood pressures fairly stable.   No large vessel occlusion.  Will resume all her antihypertensive medications.  Type 2 diabetes: Well controlled.  A1c 7.6.  Apparently not on treatment, will prescribe Metformin on discharge.  Rheumatoid arthritis with deformities: On maintenance methotrexate.  Chronic A. fib: Rate controlled.  Therapeutic on Eliquis.  Changing to Pradaxa if logistically possible.  Hypothyroidism: Euthyroid.  Continue similar dose of thyroxine.  Recurrent UTI: Currently asymptomatic.  She had dark and foul-smelling urine few days ago.  She is on chronic maintenance therapy with methenamine from her urologist.  We will continue. Urine culture growing more than 100,000 colonies of gram-negative rods, will wait for final culture sensitivity.  May treat with 5 days of therapeutic doses.  CKD stage IV: Probably at baseline.  Will check her renal functions in the morning to ensure stabilization.     DVT prophylaxis: apixaban (ELIQUIS) tablet 2.5 mg Start: 05/13/20 2200 apixaban (ELIQUIS) tablet 2.5 mg   Code Status: DNR Family Communication: Patient's daughter at the bedside Disposition Plan: Status is: Observation  The patient will require care spanning > 2 midnights and should be moved to inpatient because: Inpatient level of care appropriate due to severity of illness  Dispo: The patient is from: SNF              Anticipated d/c is to: SNF              Anticipated d/c date is: 2 days              Patient currently is not medically stable to d/c.   Difficult to place patient No         Consultants:   Neurology  Procedures:   None  Antimicrobials:  None   Subjective: Patient seen and examined.  Her daughter was at the bedside.  In early morning rounds, she was eating her breakfast.  Patient herself denies any complaints.  She denies any nausea vomiting.  Denies any weakness.  She has weakness on both arms because of rheumatoid arthritis. Daughter was wondering whether she can go to  another SNF.  Objective: Vitals:   05/13/20 2335 05/14/20 0317 05/14/20 0737 05/14/20 1153  BP: (!) 155/71 135/82 (!) 148/77 123/73  Pulse: 87 61 92 73  Resp: Temp: 97.8 F (36.6 C) (!) 97.5 F (36.4 C) 97.9 F (36.6 C) 97.9 F (36.6 C)  TempSrc: Oral Oral Oral Oral  SpO2: 95% 95% 94% 96%  Weight:      Height:        Intake/Output Summary (Last 24 hours) at 05/14/2020 1306 Last data filed at 05/14/2020 0418 Gross per 24 hour  Intake 420 ml  Output 500 ml  Net -80 ml   Filed Weights   05/13/20 1146  Weight: 66.7 kg    Examination:  General exam: Appears calm and comfortable  Frail looking older lady, appropriate for age.  Not in any distress. Patient has rheumatoid deformities of her hand. Respiratory system: Clear to auscultation. Respiratory effort normal. Cardiovascular system: S1 & S2 heard, RRR.  No edema.   Gastrointestinal system: Abdomen is nondistended, soft and nontender. No organomegaly or masses felt. Normal bowel sounds heard. Central nervous system: Alert and oriented.  She has some cognitive deficits, however she is able to have normal conversation.  No localized weakness.  Symmetrical all 4 extremities.    Data Reviewed: I have personally reviewed following labs and imaging studies  CBC: Recent Labs  Lab 05/13/20 1229  WBC 8.8  NEUTROABS 5.1  HGB 11.4*  HCT 36.7  MCV 98.4  PLT 328   Basic Metabolic Panel: Recent Labs  Lab 05/13/20 1229  NA 137  K 4.1  CL 99  CO2 27  GLUCOSE 227*  BUN 44*  CREATININE 1.59*  CALCIUM 9.3   GFR: Estimated Creatinine Clearance: 21.9 mL/min (A) (by C-G formula based on SCr of 1.59 mg/dL (H)). Liver Function Tests: Recent Labs  Lab 05/13/20 1229  AST 22  ALT 11  ALKPHOS 71  BILITOT 0.5  PROT 7.7  ALBUMIN 2.4*   No results for input(s): LIPASE, AMYLASE in the last 168 hours. No results for input(s): AMMONIA in the last 168 hours. Coagulation Profile: No results for input(s): INR,  PROTIME in the last 168 hours. Cardiac Enzymes: No results for input(s): CKTOTAL, CKMB, CKMBINDEX, TROPONINI in the last 168 hours. BNP (last 3 results) No results for input(s): PROBNP in the last 8760 hours. HbA1C: Recent Labs    05/14/20 0337  HGBA1C 7.6*   CBG: Recent Labs  Lab 05/13/20 1222 05/13/20 1900 05/13/20 2110 05/14/20 0911 05/14/20 1230  GLUCAP 206* 128* 184* 115* 149*   Lipid Profile: Recent Labs    05/14/20 0337  CHOL 99  HDL 27*  LDLCALC 57  TRIG 74  CHOLHDL 3.7   Thyroid Function Tests: Recent Labs    05/13/20 1846  TSH 1.702   Anemia Panel: No results for input(s): VITAMINB12, FOLATE, FERRITIN, TIBC, IRON, RETICCTPCT in the last 72 hours. Sepsis Labs: No results for input(s): PROCALCITON, LATICACIDVEN in the last 168 hours.  Recent Results (from the past 240 hour(s))  SARS CORONAVIRUS 2 (TAT 6-24 HRS) Nasopharyngeal Nasopharyngeal Swab     Status: None  Collection Time: 05/13/20  1:19 PM   Specimen: Nasopharyngeal Swab  Result Value Ref Range Status   SARS Coronavirus 2 NEGATIVE NEGATIVE Final    Comment: (NOTE) SARS-CoV-2 target nucleic acids are NOT DETECTED.  The SARS-CoV-2 RNA is generally detectable in upper and lower respiratory specimens during the acute phase of infection. Negative results do not preclude SARS-CoV-2 infection, do not rule out co-infections with other pathogens, and should not be used as the sole basis for treatment or other patient management decisions. Negative results must be combined with clinical observations, patient history, and epidemiological information. The expected result is Negative.  Fact Sheet for Patients: HairSlick.no  Fact Sheet for Healthcare Providers: quierodirigir.com  This test is not yet approved or cleared by the [place] FDA and  has been authorized for detection and/or diagnosis of SARS-CoV-2 by FDA under an Emergency Use  Authorization (EUA). This EUA will remain  in effect (meaning this test can be used) for the duration of the COVID-19 declaration under Se ction 564(b)(1) of the Act, 21 U.S.C. section 360bbb-3(b)(1), unless the authorization is terminated or revoked sooner.  Performed at [facility], [address], [place], [place] [address]   Urine culture     Status: Abnormal (Preliminary result)   Collection Time: 05/13/20  2:42 PM   Specimen: Urine, Random  Result Value Ref Range Status   Specimen Description URINE, RANDOM  Final   Special Requests NONE  Final   Culture (A)  Final    >=100,000 COLONIES/mL GRAM NEGATIVE RODS CULTURE REINCUBATED FOR BETTER GROWTH Performed at [facility], [address] [place], [place] [address]    Report Status PENDING  Incomplete         Radiology Studies: DG Chest 2 View  Result Date: 05/13/2020 CLINICAL DATA:  Confusion. EXAM: CHEST - 2 VIEW COMPARISON:  05/19/2019 FINDINGS: Osteopenia.  Thoracic vertebral body height loss at multiple levels. Midline trachea. Moderate cardiomegaly. Tortuous thoracic aorta. Atherosclerosis in the transverse aorta. Prior median sternotomy. No pleural effusion or pneumothorax. Chronic mild interstitial thickening, without overt congestive failure. No lobar consolidation. IMPRESSION: No acute cardiopulmonary disease. Cardiomegaly without congestive failure. Aortic Atherosclerosis (ICD10-I70.0). Electronically Signed   By: Jeronimo Greaves M.D.   On: 05/13/2020 13:38   CT Head Wo Contrast  Result Date: 05/13/2020 CLINICAL DATA:  Altered mental status. EXAM: CT HEAD WITHOUT CONTRAST TECHNIQUE: Contiguous axial images were obtained from the base of the skull through the vertex without intravenous contrast. COMPARISON:  May 19, 2019. FINDINGS: Brain: Mild chronic ischemic white matter disease is noted. No mass effect or midline shift is noted. Ventricular size is within normal limits. There is no evidence of mass  lesion, hemorrhage or acute infarction. Vascular: No hyperdense vessel or unexpected calcification. Skull: Normal. Negative for fracture or focal lesion. Sinuses/Orbits: No acute finding. Other: None. IMPRESSION: Mild chronic ischemic white matter disease. No acute intracranial abnormality seen. Electronically Signed   By: Lupita Raider M.D.   On: 05/13/2020 13:02   MR ANGIO HEAD WO CONTRAST  Result Date: 05/13/2020 CLINICAL DATA:  Transient ischemic attack (TIA). EXAM: MRI HEAD WITHOUT CONTRAST MRA HEAD WITHOUT CONTRAST TECHNIQUE: Multiplanar, multiecho pulse sequences of the brain and surrounding structures were obtained without intravenous contrast. Angiographic images of the head were obtained using MRA technique without contrast. COMPARISON:  Prior head CT examinations 05/13/2020 and earlier. FINDINGS: MRI HEAD FINDINGS Brain: The examination is intermittently motion degraded. Most notably, there is moderate motion degradation of  the sagittal T1 weighted sequence and moderate/severe motion degradation of the axial T2/FLAIR sequence. Mild-to-moderate cerebral and cerebellar atrophy. There are 4 subcentimeter acute infarcts within the cortex and subcortical white matter of the posterior left frontal lobe. Notably, two of these infarcts involve the left motor strip. These infarcts are located in the left MCA territory and left MCA/ACA watershed territory. Chronic lacunar infarcts within the bilateral corona radiata/basal ganglia and right thalamus. Background moderately advanced multifocal T2/FLAIR hyperintensity within the cerebral white matter which is nonspecific, but compatible with chronic small vessel ischemic disease. To a lesser degree, chronic small vessel ischemic changes are also present within the pons. Scattered supratentorial and infratentorial chronic microhemorrhages. No evidence of intracranial mass. No extra-axial fluid collection. No midline shift. Vascular: Reported below. Skull and upper  cervical spine: Within the limitations of motion degradation, no focal marrow lesion is identified. Sinuses/Orbits: Visualized orbits show no acute finding. Trace ethmoid sinus mucosal thickening. MRA HEAD FINDINGS The intracranial internal carotid arteries are patent. 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 The M1 middle cerebral arteries are patent. No M2 proximal branch occlusion or high-grade proximal stenosis is identified. The anterior cerebral arteries are patent. 3 mm aneurysm arising from the proximal aspect of the persistent primitive trigeminal artery (for instance as seen on series 2, image 47) (series 252, image 5). Suspected 1-2 mm aneurysm arising from the cavernous right ICA (series 2, image 45). 2 mm laterally projecting aneurysm arising from the cavernous left ICA (series 2, image 46). 2 mm medially projecting vascular protrusion arising from the distal cavernous left ICA, likely aneurysm (series 2, image 52). 2-3 mm inferiorly projecting vascular protrusion arising from the supraclinoid left ICA, likely aneurysm (series 2, image 57). Developmentally diminutive vertebral and proximal basilar arteries. The right vertebral artery appears to terminate predominantly as the right PICA. The basilar artery is patent. The posterior cerebral arteries are patent without significant proximal stenosis. Posterior communicating arteries are hypoplastic or absent bilaterally. IMPRESSION: MRI brain: 1. Motion degraded examination as described. 2. Four subcentimeter acute infarcts within the cortex and subcortical white matter of the posterior left frontal lobe (MCA territory and MCA/ACA watershed territory). Notably, two of these infarcts involve the left motor strip. 3. Chronic lacunar infarcts within the bilateral corona radiata/basal ganglia and right thalamus. 4. Background moderately advanced chronic small vessel ischemic disease. 5. Scattered supratentorial greater than infratentorial chronic  microhemorrhages, which may reflect sequela of hypertensive microangiopathy and/or cerebral amyloid angiopathy. 6. Mild-to-moderate atrophy of the brain. MRA head: 1. No intracranial large vessel occlusion or high-grade proximal arterial stenosis. 2. 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 Developmentally diminutive vertebral and proximal basilar arteries. 3. Multiple intracranial aneurysms as follows. 3 mm aneurysm arising from the proximal aspect of the persistent primitive trigeminal artery. Suspected 1-2 mm aneurysm arising from the cavernous right ICA. 2 mm aneurysm arising from the cavernous left ICA. 2 mm aneurysm arising from the cavernous left ICA more distally. 2-3 mm aneurysm arising from the supraclinoid left ICA. Nonemergent neurointerventional consultation should be considered. Electronically Signed   By: Jackey Loge DO   On: 05/13/2020 17:42   MR BRAIN WO CONTRAST  Result Date: 05/13/2020 CLINICAL DATA:  Transient ischemic attack (TIA). EXAM: MRI HEAD WITHOUT CONTRAST MRA HEAD WITHOUT CONTRAST TECHNIQUE: Multiplanar, multiecho pulse sequences of the brain and surrounding structures were obtained without intravenous contrast. Angiographic images of the head were obtained using MRA technique without contrast. COMPARISON:  Prior head CT examinations 05/13/2020 and earlier. FINDINGS:  MRI HEAD FINDINGS Brain: The examination is intermittently motion degraded. Most notably, there is moderate motion degradation of the sagittal T1 weighted sequence and moderate/severe motion degradation of the axial T2/FLAIR sequence. Mild-to-moderate cerebral and cerebellar atrophy. There are 4 subcentimeter acute infarcts within the cortex and subcortical white matter of the posterior left frontal lobe. Notably, two of these infarcts involve the left motor strip. These infarcts are located in the left MCA territory and left MCA/ACA watershed territory. Chronic lacunar infarcts within the bilateral corona  radiata/basal ganglia and right thalamus. Background moderately advanced multifocal T2/FLAIR hyperintensity within the cerebral white matter which is nonspecific, but compatible with chronic small vessel ischemic disease. To a lesser degree, chronic small vessel ischemic changes are also present within the pons. Scattered supratentorial and infratentorial chronic microhemorrhages. No evidence of intracranial mass. No extra-axial fluid collection. No midline shift. Vascular: Reported below. Skull and upper cervical spine: Within the limitations of motion degradation, no focal marrow lesion is identified. Sinuses/Orbits: Visualized orbits show no acute finding. Trace ethmoid sinus mucosal thickening. MRA HEAD FINDINGS The intracranial internal carotid arteries are patent. 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 The M1 middle cerebral arteries are patent. No M2 proximal branch occlusion or high-grade proximal stenosis is identified. The anterior cerebral arteries are patent. 3 mm aneurysm arising from the proximal aspect of the persistent primitive trigeminal artery (for instance as seen on series 2, image 47) (series 252, image 5). Suspected 1-2 mm aneurysm arising from the cavernous right ICA (series 2, image 45). 2 mm laterally projecting aneurysm arising from the cavernous left ICA (series 2, image 46). 2 mm medially projecting vascular protrusion arising from the distal cavernous left ICA, likely aneurysm (series 2, image 52). 2-3 mm inferiorly projecting vascular protrusion arising from the supraclinoid left ICA, likely aneurysm (series 2, image 57). Developmentally diminutive vertebral and proximal basilar arteries. The right vertebral artery appears to terminate predominantly as the right PICA. The basilar artery is patent. The posterior cerebral arteries are patent without significant proximal stenosis. Posterior communicating arteries are hypoplastic or absent bilaterally. IMPRESSION: MRI brain:  1. Motion degraded examination as described. 2. Four subcentimeter acute infarcts within the cortex and subcortical white matter of the posterior left frontal lobe (MCA territory and MCA/ACA watershed territory). Notably, two of these infarcts involve the left motor strip. 3. Chronic lacunar infarcts within the bilateral corona radiata/basal ganglia and right thalamus. 4. Background moderately advanced chronic small vessel ischemic disease. 5. Scattered supratentorial greater than infratentorial chronic microhemorrhages, which may reflect sequela of hypertensive microangiopathy and/or cerebral amyloid angiopathy. 6. Mild-to-moderate atrophy of the brain. MRA head: 1. No intracranial large vessel occlusion or high-grade proximal arterial stenosis. 2. 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 Developmentally diminutive vertebral and proximal basilar arteries. 3. Multiple intracranial aneurysms as follows. 3 mm aneurysm arising from the proximal aspect of the persistent primitive trigeminal artery. Suspected 1-2 mm aneurysm arising from the cavernous right ICA. 2 mm aneurysm arising from the cavernous left ICA. 2 mm aneurysm arising from the cavernous left ICA more distally. 2-3 mm aneurysm arising from the supraclinoid left ICA. Nonemergent neurointerventional consultation should be considered. Electronically Signed   By: Jackey Loge DO   On: 05/13/2020 17:42   ECHOCARDIOGRAM COMPLETE  Result Date: 05/14/2020    ECHOCARDIOGRAM REPORT   Patient Name:   TAREA SKILLMAN Date of Exam: 05/14/2020 Medical Rec #:  [id number]       Height:       62.0 in  Accession #:    [id number]      Weight:       147.0 lb Date of Birth:  [birth year]      BSA:          1.677 m Patient Age:    89 years        BP:           148/77 mmHg Patient Gender: F               HR:           92 bpm. Exam Location:  Inpatient Procedure: 2D Echo Indications:    TIA 435.9 / G45.9  History:        Patient has prior history of Echocardiogram  examinations, most                 recent 11/18/2016. CHF, CAD, Carotid Disease and PAD; Risk                 Factors:Hypertension and Dyslipidemia. GERD. Thyroid disease.  Sonographer:    Leta Jungling RDCS Referring Phys: [id number] JENNIFER YATES IMPRESSIONS  1. Left ventricular ejection fraction, by estimation, is 55 to 60%. The left ventricle has normal function. The left ventricle has no regional wall motion abnormalities. There is moderate to severe left ventricular hypertrophy. Left ventricular diastolic parameters are indeterminate.  2. Right ventricular systolic function is normal. The right ventricular size is normal. There is mildly elevated pulmonary artery systolic pressure.  3. Left atrial size was severely dilated.  4. Right atrial size was mildly dilated.  5. The mitral valve is abnormal. Moderate mitral valve regurgitation. Moderate to severe mitral annular calcification.  6. Tricuspid valve regurgitation is mild to moderate.  7. AV is thickened, calcified with restricted motion Peak and mean gradients through the valve are 57 and 38 mm Hg respectively THis is consistent with moderate/severe to severe AS Dimensionless index is 0.15 consistent with critical AS COmpared to echo  images from 2018, AV is thicker, more calcified and the gradients are increased. . Aortic valve regurgitation is mild. FINDINGS  Left Ventricle: Left ventricular ejection fraction, by estimation, is 55 to 60%. The left ventricle has normal function. The left ventricle has no regional wall motion abnormalities. The left ventricular internal cavity size was normal in size. There is  moderate to severe left ventricular hypertrophy. Left ventricular diastolic parameters are indeterminate. Right Ventricle: The right ventricular size is normal. Right vetricular wall thickness was not assessed. Right ventricular systolic function is normal. There is mildly elevated pulmonary artery systolic pressure. The tricuspid regurgitant velocity is  3.07 m/s, and with an assumed right atrial pressure of 3 mmHg, the estimated right ventricular systolic pressure is 40.7 mmHg. Left Atrium: Left atrial size was severely dilated. Right Atrium: Right atrial size was mildly dilated. Pericardium: There is no evidence of pericardial effusion. Mitral Valve: The mitral valve is abnormal. There is mild thickening of the mitral valve leaflet(s). Moderate to severe mitral annular calcification. Moderate mitral valve regurgitation. Tricuspid Valve: The tricuspid valve is normal in structure. Tricuspid valve regurgitation is mild to moderate. Aortic Valve: AV is thickened, calcified with restricted motion Peak and mean gradients through the valve are 57 and 38 mm Hg respectively THis is consistent with moderate/severe to severe AS Dimensionless index is 0.15 consistent with critical AS COmpareed to echo images from 2018, AV is thicker, more calcified and the gradients are increased. The aortic valve is abnormal. Aortic valve regurgitation is  mild. Aortic regurgitation PHT measures 228 msec. Aortic valve mean gradient measures 33.8 mmHg. Aortic valve peak gradient measures 53.9 mmHg. Aortic valve area, by VTI measures 0.64 cm. Pulmonic Valve: The pulmonic valve was not well visualized. Pulmonic valve regurgitation is not visualized. Aorta: The aortic root is normal in size and structure. IAS/Shunts: No atrial level shunt detected by color flow Doppler.  LEFT VENTRICLE PLAX 2D LVIDd:         3.80 cm LVIDs:         3.50 cm LV PW:         1.65 cm LV IVS:        1.30 cm LVOT diam:     2.30 cm LV SV:         45 LV SV Index:   27 LVOT Area:     4.15 cm  LEFT ATRIUM              Index       RIGHT ATRIUM           Index LA diam:        3.90 cm  2.32 cm/m  RA Area:     21.90 cm LA Vol (A2C):   144.0 ml 85.84 ml/m RA Volume:   65.90 ml  39.29 ml/m LA Vol (A4C):   112.0 ml 66.77 ml/m LA Biplane Vol: 128.0 ml 76.31 ml/m  AORTIC VALVE AV Area (Vmax):    0.79 cm AV Area (Vmean):    0.68 cm AV Area (VTI):     0.64 cm AV Vmax:           367.25 cm/s AV Vmean:          273.250 cm/s AV VTI:            0.705 m AV Peak Grad:      53.9 mmHg AV Mean Grad:      33.8 mmHg LVOT Vmax:         69.78 cm/s LVOT Vmean:        44.700 cm/s LVOT VTI:          0.109 m LVOT/AV VTI ratio: 0.15 AI PHT:            228 msec MITRAL VALVE               TRICUSPID VALVE MV Area (PHT): 5.86 cm    TR Peak grad:   37.7 mmHg MV Decel Time: 130 msec    TR Vmax:        307.00 cm/s MR Peak grad: 128.1 mmHg MR Mean grad: 86.0 mmHg    SHUNTS MR Vmax:      566.00 cm/s  Systemic VTI:  0.11 m MR Vmean:     446.0 cm/s   Systemic Diam: 2.30 cm MV E velocity: 98.55 cm/s Dietrich Pates MD Electronically signed by Dietrich Pates MD Signature Date/Time: 05/14/2020/11:46:22 AM    Final    VAS US CAROTID (at [facility] and [facility] only)  Result Date: 05/13/2020 Carotid Arterial Duplex Study Indications:      TIA. Risk Factors:     Hypertension, hyperlipidemia, Diabetes, coronary artery                   disease. Comparison Study: 02/21/17 previous Performing Technologist: Blanch Media RVS  Examination Guidelines: A complete evaluation includes B-mode imaging, spectral Doppler, color Doppler, and power Doppler as needed of all accessible portions of each vessel. Bilateral testing is considered an integral part of  a complete examination. Limited examinations for reoccurring indications may be performed as noted.  Right Carotid Findings: +----------+--------+--------+--------+-------------------+--------------------+           PSV cm/sEDV cm/sStenosisPlaque Description Comments             +----------+--------+--------+--------+-------------------+--------------------+ CCA Prox  86      21              heterogenous                            +----------+--------+--------+--------+-------------------+--------------------+ CCA Distal95      14              heterogenous                             +----------+--------+--------+--------+-------------------+--------------------+ ICA Prox  124     27      1-39%   irregular, calcificVelocities may                                         and heterogenous   underestimate degree                                                      of stenosis due to                                                        more proximal                                                             obstruction.         +----------+--------+--------+--------+-------------------+--------------------+ ICA Distal92      38                                                      +----------+--------+--------+--------+-------------------+--------------------+ ECA       263                                                             +----------+--------+--------+--------+-------------------+--------------------+ +----------+--------+-------+--------+-------------------+           PSV cm/sEDV cmsDescribeArm Pressure (mmHG) +----------+--------+-------+--------+-------------------+ Subclavian122                                        +----------+--------+-------+--------+-------------------+ +---------+--------+--------+--------------+ VertebralPSV cm/sEDV cm/sNot identified +---------+--------+--------+--------------+  Left Carotid Findings: +----------+-------+-------+--------+---------------------------------+--------+  PSV    EDV    StenosisPlaque Description               Comments           cm/s   cm/s                                                     +----------+-------+-------+--------+---------------------------------+--------+ CCA Prox  118    16             heterogenous                              +----------+-------+-------+--------+---------------------------------+--------+ CCA Distal79     11             heterogenous                               +----------+-------+-------+--------+---------------------------------+--------+ ICA Prox  221    49     40-59%  irregular, calcific and                                                   heterogenous                              +----------+-------+-------+--------+---------------------------------+--------+ ICA Mid   132    30                                                       +----------+-------+-------+--------+---------------------------------+--------+ ICA Distal88     21                                                       +----------+-------+-------+--------+---------------------------------+--------+ ECA       140                                                             +----------+-------+-------+--------+---------------------------------+--------+ +----------+--------+--------+--------+-------------------+           PSV cm/sEDV cm/sDescribeArm Pressure (mmHG) +----------+--------+--------+--------+-------------------+ Subclavian70                                          +----------+--------+--------+--------+-------------------+ +---------+--------+--+--------+-+---------+ VertebralPSV cm/s46EDV cm/s9Antegrade +---------+--------+--+--------+-+---------+   Summary: Right Carotid: Velocities in the right ICA are consistent with a 1-39% stenosis. Left Carotid: Velocities in the left ICA are consistent with a 40-59% stenosis. Vertebrals: Left vertebral artery demonstrates antegrade flow. Right vertebral  artery was not visualized. *See table(s) above for measurements and observations.  Electronically signed by Coral Else MD on 05/13/2020 at 7:12:03 PM.    Final         Scheduled Meds: . apixaban  2.5 mg Oral BID  . atorvastatin  40 mg Oral Daily  . docusate sodium  100 mg Oral BID  . FLUoxetine  20 mg Oral Daily  . insulin aspart  0-5 Units Subcutaneous QHS  . insulin aspart  0-9 Units Subcutaneous TID WC  . levothyroxine   75 mcg Oral QAC breakfast  . memantine  10 mg Oral QHS  . methenamine  500 mg Oral BID  . mirtazapine  7.5 mg Oral QHS  . polyethylene glycol  17 g Oral Daily  . polyvinyl alcohol  1 drop Both Eyes QID  . traMADol  50 mg Oral BID   Continuous Infusions: . sodium chloride 50 mL/hr at 05/13/20 2037     LOS: 0 days    Time spent: 25    Dorcas Carrow, MD [facility] Pager [phone]

## 2020-05-14 NOTE — Progress Notes (Signed)
SLP Cancellation Note  Patient Details Name: [name] MRN: [id number] DOB: 01/06/1930   Cancelled treatment:       Reason Eval/Treat Not Completed: SLP screened, no needs identified, will sign off; family stated pt was a resident at [place] prior to hospitalization.  Family kindly deferred evaluation as pt has returned to baseline functioning.     Tressie Stalker, M.S., CCC-SLP 05/14/2020, 12:37 PM

## 2020-05-14 NOTE — Procedures (Signed)
Patient Name: [name]  MRN: [id number]  Epilepsy Attending: Charlsie Quest  Referring Physician/Provider: Lanae Boast, NP Date: 05/14/2020 Duration: 28.08 mins  Patient history:  87yo F presented with transient altered mental status and facial droop. EEG to evaluate for seizure  Level of alertness: Awake  AEDs during EEG study: None  Technical aspects: This EEG study was done with scalp electrodes positioned according to the 10-20 International system of electrode placement. Electrical activity was acquired at a sampling rate of 500Hz  and reviewed with a high frequency filter of 70Hz  and a low frequency filter of 1Hz . EEG data were recorded continuously and digitally stored.   Description: The posterior dominant rhythm consists of 8Hz  activity of moderate voltage (25-35 uV) seen predominantly in posterior head regions, symmetric and reactive to eye opening and eye closing. Hyperventilation and photic stimulation were not performed.     IMPRESSION: This study is within normal limits. No seizures or epileptiform discharges were seen throughout the recording.  Leeann Bady 

## 2020-05-14 NOTE — TOC Benefit Eligibility Note (Signed)
Transition of Care [facility]) Benefit Eligibility Note    Patient Details  Name: [name] MRN: [id number] Date of Birth: February 14, 1930   Medication/Dose: Pradaxa 150 mg.bid for 30 day supply  Covered?: Yes  Tier:  (4)  Prescription Coverage [facility] [facility]  Spoke with Person/Company/Phone Number:: Anissa C. W[employer] PH# [phone]  Co-Pay: Zero  Prior Approval: No  Deductible:  (no deductible)       [name] Phone Number: 05/14/2020, 12:18 PM

## 2020-05-14 NOTE — Progress Notes (Signed)
STROKE TEAM PROGRESS NOTE   INTERVAL HISTORY Patient evaluated at the bedside.  Daughter was at the bedside.  Vital stable.  Afebrile.  Patient initially admitted with episode of unresponsiveness with eyes open and right eye fixation and right-sided facial droop. Her symptoms have resolved on arrival to the hospital.  Patient is on Eliquis for atrial fibrillation.  CT negative for acute intracranial abnormality.  MRI brain shows acute infarct in cortex and subcortical white matter of the posterior left frontal lobe chronic lacunar infarcts within the bilateral coronary radiata/basal ganglia and right thalamus.  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  Also shows multiple aneurysms.  ECHO shows LVEF of 55 to 60% severely dilated left atrium and mildly dilated right atrium, and calcified and thicker AV.  Patient is alert, awake, oriented to self, is, situation, does not know date and year. No focal deficits present.  Daughter confirmed that she is at the baseline.  Denies any complaints.  Patient is on aspirin 325 mg and Eliquis 2.5 mg.  Discussed with daughter about the option of changing Eliquis to Pradaxa.  Daughter will discuss cost with Child psychotherapist. Pt recommended SNF. We'll stop Aspirin.  Vitals:   05/13/20 2335 05/14/20 0317 05/14/20 0737 05/14/20 1153  BP: (!) 155/71 135/82 (!) 148/77 123/73  Pulse: 87 61 92 73  Resp: 19 16 20 18   Temp: 97.8 F (36.6 C) (!) 97.5 F (36.4 C) 97.9 F (36.6 C) 97.9 F (36.6 C)  TempSrc: Oral Oral Oral Oral  SpO2: 95% 95% 94% 96%  Weight:      Height:       CBC:  Recent Labs  Lab 05/13/20 1229  WBC 8.8  NEUTROABS 5.1  HGB 11.4*  HCT 36.7  MCV 98.4  PLT 328   Basic Metabolic Panel:  Recent Labs  Lab 05/13/20 1229  NA 137  K 4.1  CL 99  CO2 27  GLUCOSE 227*  BUN 44*  CREATININE 1.59*  CALCIUM 9.3   Lipid Panel:  Recent Labs  Lab 05/14/20 0337  CHOL 99  TRIG 74  HDL 27*   CHOLHDL 3.7  VLDL 15  LDLCALC 57   HgbA1c:  Recent Labs  Lab 05/14/20 0337  HGBA1C 7.6*   Urine Drug Screen: No results for input(s): LABOPIA, COCAINSCRNUR, LABBENZ, AMPHETMU, THCU, LABBARB in the last 168 hours.  Alcohol Level No results for input(s): ETH in the last 168 hours.  IMAGING past 24 hours DG Chest 2 View  Result Date: 05/13/2020 CLINICAL DATA:  Confusion. EXAM: CHEST - 2 VIEW COMPARISON:  05/19/2019 FINDINGS: Osteopenia.  Thoracic vertebral body height loss at multiple levels. Midline trachea. Moderate cardiomegaly. Tortuous thoracic aorta. Atherosclerosis in the transverse aorta. Prior median sternotomy. No pleural effusion or pneumothorax. Chronic mild interstitial thickening, without overt congestive failure. No lobar consolidation. IMPRESSION: No acute cardiopulmonary disease. Cardiomegaly without congestive failure. Aortic Atherosclerosis (ICD10-I70.0). Electronically Signed   By: Jeronimo Greaves M.D.   On: 05/13/2020 13:38   MR ANGIO HEAD WO CONTRAST  Result Date: 05/13/2020 CLINICAL DATA:  Transient ischemic attack (TIA). EXAM: MRI HEAD WITHOUT CONTRAST MRA HEAD WITHOUT CONTRAST TECHNIQUE: Multiplanar, multiecho pulse sequences of the brain and surrounding structures were obtained without intravenous contrast. Angiographic images of the head were obtained using MRA technique without contrast. COMPARISON:  Prior head CT examinations 05/13/2020 and earlier. FINDINGS: MRI HEAD FINDINGS Brain: The examination is intermittently motion degraded. Most notably, there is moderate motion degradation of the  sagittal T1 weighted sequence and moderate/severe motion degradation of the axial T2/FLAIR sequence. Mild-to-moderate cerebral and cerebellar atrophy. There are 4 subcentimeter acute infarcts within the cortex and subcortical white matter of the posterior left frontal lobe. Notably, two of these infarcts involve the left motor strip. These infarcts are located in the left MCA territory  and left MCA/ACA watershed territory. Chronic lacunar infarcts within the bilateral corona radiata/basal ganglia and right thalamus. Background moderately advanced multifocal T2/FLAIR hyperintensity within the cerebral white matter which is nonspecific, but compatible with chronic small vessel ischemic disease. To a lesser degree, chronic small vessel ischemic changes are also present within the pons. Scattered supratentorial and infratentorial chronic microhemorrhages. No evidence of intracranial mass. No extra-axial fluid collection. No midline shift. Vascular: Reported below. Skull and upper cervical spine: Within the limitations of motion degradation, no focal marrow lesion is identified. Sinuses/Orbits: Visualized orbits show no acute finding. Trace ethmoid sinus mucosal thickening. MRA HEAD FINDINGS The intracranial internal carotid arteries are patent. 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 The M1 middle cerebral arteries are patent. No M2 proximal branch occlusion or high-grade proximal stenosis is identified. The anterior cerebral arteries are patent. 3 mm aneurysm arising from the proximal aspect of the persistent primitive trigeminal artery (for instance as seen on series 2, image 47) (series 252, image 5). Suspected 1-2 mm aneurysm arising from the cavernous right ICA (series 2, image 45). 2 mm laterally projecting aneurysm arising from the cavernous left ICA (series 2, image 46). 2 mm medially projecting vascular protrusion arising from the distal cavernous left ICA, likely aneurysm (series 2, image 52). 2-3 mm inferiorly projecting vascular protrusion arising from the supraclinoid left ICA, likely aneurysm (series 2, image 57). Developmentally diminutive vertebral and proximal basilar arteries. The right vertebral artery appears to terminate predominantly as the right PICA. The basilar artery is patent. The posterior cerebral arteries are patent without significant proximal stenosis.  Posterior communicating arteries are hypoplastic or absent bilaterally. IMPRESSION: MRI brain: 1. Motion degraded examination as described. 2. Four subcentimeter acute infarcts within the cortex and subcortical white matter of the posterior left frontal lobe (MCA territory and MCA/ACA watershed territory). Notably, two of these infarcts involve the left motor strip. 3. Chronic lacunar infarcts within the bilateral corona radiata/basal ganglia and right thalamus. 4. Background moderately advanced chronic small vessel ischemic disease. 5. Scattered supratentorial greater than infratentorial chronic microhemorrhages, which may reflect sequela of hypertensive microangiopathy and/or cerebral amyloid angiopathy. 6. Mild-to-moderate atrophy of the brain. MRA head: 1. No intracranial large vessel occlusion or high-grade proximal arterial stenosis. 2. 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 Developmentally diminutive vertebral and proximal basilar arteries. 3. Multiple intracranial aneurysms as follows. 3 mm aneurysm arising from the proximal aspect of the persistent primitive trigeminal artery. Suspected 1-2 mm aneurysm arising from the cavernous right ICA. 2 mm aneurysm arising from the cavernous left ICA. 2 mm aneurysm arising from the cavernous left ICA more distally. 2-3 mm aneurysm arising from the supraclinoid left ICA. Nonemergent neurointerventional consultation should be considered. Electronically Signed   By: Jackey Loge DO   On: 05/13/2020 17:42   MR BRAIN WO CONTRAST  Result Date: 05/13/2020 CLINICAL DATA:  Transient ischemic attack (TIA). EXAM: MRI HEAD WITHOUT CONTRAST MRA HEAD WITHOUT CONTRAST TECHNIQUE: Multiplanar, multiecho pulse sequences of the brain and surrounding structures were obtained without intravenous contrast. Angiographic images of the head were obtained using MRA technique without contrast. COMPARISON:  Prior head CT examinations 05/13/2020 and earlier. FINDINGS: MRI  HEAD  FINDINGS Brain: The examination is intermittently motion degraded. Most notably, there is moderate motion degradation of the sagittal T1 weighted sequence and moderate/severe motion degradation of the axial T2/FLAIR sequence. Mild-to-moderate cerebral and cerebellar atrophy. There are 4 subcentimeter acute infarcts within the cortex and subcortical white matter of the posterior left frontal lobe. Notably, two of these infarcts involve the left motor strip. These infarcts are located in the left MCA territory and left MCA/ACA watershed territory. Chronic lacunar infarcts within the bilateral corona radiata/basal ganglia and right thalamus. Background moderately advanced multifocal T2/FLAIR hyperintensity within the cerebral white matter which is nonspecific, but compatible with chronic small vessel ischemic disease. To a lesser degree, chronic small vessel ischemic changes are also present within the pons. Scattered supratentorial and infratentorial chronic microhemorrhages. No evidence of intracranial mass. No extra-axial fluid collection. No midline shift. Vascular: Reported below. Skull and upper cervical spine: Within the limitations of motion degradation, no focal marrow lesion is identified. Sinuses/Orbits: Visualized orbits show no acute finding. Trace ethmoid sinus mucosal thickening. MRA HEAD FINDINGS The intracranial internal carotid arteries are patent. 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 The M1 middle cerebral arteries are patent. No M2 proximal branch occlusion or high-grade proximal stenosis is identified. The anterior cerebral arteries are patent. 3 mm aneurysm arising from the proximal aspect of the persistent primitive trigeminal artery (for instance as seen on series 2, image 47) (series 252, image 5). Suspected 1-2 mm aneurysm arising from the cavernous right ICA (series 2, image 45). 2 mm laterally projecting aneurysm arising from the cavernous left ICA (series 2, image 46). 2 mm  medially projecting vascular protrusion arising from the distal cavernous left ICA, likely aneurysm (series 2, image 52). 2-3 mm inferiorly projecting vascular protrusion arising from the supraclinoid left ICA, likely aneurysm (series 2, image 57). Developmentally diminutive vertebral and proximal basilar arteries. The right vertebral artery appears to terminate predominantly as the right PICA. The basilar artery is patent. The posterior cerebral arteries are patent without significant proximal stenosis. Posterior communicating arteries are hypoplastic or absent bilaterally. IMPRESSION: MRI brain: 1. Motion degraded examination as described. 2. Four subcentimeter acute infarcts within the cortex and subcortical white matter of the posterior left frontal lobe (MCA territory and MCA/ACA watershed territory). Notably, two of these infarcts involve the left motor strip. 3. Chronic lacunar infarcts within the bilateral corona radiata/basal ganglia and right thalamus. 4. Background moderately advanced chronic small vessel ischemic disease. 5. Scattered supratentorial greater than infratentorial chronic microhemorrhages, which may reflect sequela of hypertensive microangiopathy and/or cerebral amyloid angiopathy. 6. Mild-to-moderate atrophy of the brain. MRA head: 1. No intracranial large vessel occlusion or high-grade proximal arterial stenosis. 2. 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 Developmentally diminutive vertebral and proximal basilar arteries. 3. Multiple intracranial aneurysms as follows. 3 mm aneurysm arising from the proximal aspect of the persistent primitive trigeminal artery. Suspected 1-2 mm aneurysm arising from the cavernous right ICA. 2 mm aneurysm arising from the cavernous left ICA. 2 mm aneurysm arising from the cavernous left ICA more distally. 2-3 mm aneurysm arising from the supraclinoid left ICA. Nonemergent neurointerventional consultation should be considered. Electronically  Signed   By: Jackey Loge DO   On: 05/13/2020 17:42   ECHOCARDIOGRAM COMPLETE  Result Date: 05/14/2020    ECHOCARDIOGRAM REPORT   Patient Name:   KHANIYA TENAGLIA Date of Exam: 05/14/2020 Medical Rec #:  [id number]       Height:       62.0 in  Accession #:    [id number]      Weight:       147.0 lb Date of Birth:  05-12-1929      BSA:          1.677 m Patient Age:    87 years        BP:           148/77 mmHg Patient Gender: F               HR:           92 bpm. Exam Location:  Inpatient Procedure: 2D Echo Indications:    TIA 435.9 / G45.9  History:        Patient has prior history of Echocardiogram examinations, most                 recent 11/18/2016. CHF, CAD, Carotid Disease and PAD; Risk                 Factors:Hypertension and Dyslipidemia. GERD. Thyroid disease.  Sonographer:    Leta Jungling RDCS Referring Phys: [id number] JENNIFER YATES IMPRESSIONS  1. Left ventricular ejection fraction, by estimation, is 55 to 60%. The left ventricle has normal function. The left ventricle has no regional wall motion abnormalities. There is moderate to severe left ventricular hypertrophy. Left ventricular diastolic parameters are indeterminate.  2. Right ventricular systolic function is normal. The right ventricular size is normal. There is mildly elevated pulmonary artery systolic pressure.  3. Left atrial size was severely dilated.  4. Right atrial size was mildly dilated.  5. The mitral valve is abnormal. Moderate mitral valve regurgitation. Moderate to severe mitral annular calcification.  6. Tricuspid valve regurgitation is mild to moderate.  7. AV is thickened, calcified with restricted motion Peak and mean gradients through the valve are 57 and 38 mm Hg respectively THis is consistent with moderate/severe to severe AS Dimensionless index is 0.15 consistent with critical AS COmpared to echo  images from 2018, AV is thicker, more calcified and the gradients are increased. . Aortic valve regurgitation is mild. FINDINGS  Left  Ventricle: Left ventricular ejection fraction, by estimation, is 55 to 60%. The left ventricle has normal function. The left ventricle has no regional wall motion abnormalities. The left ventricular internal cavity size was normal in size. There is  moderate to severe left ventricular hypertrophy. Left ventricular diastolic parameters are indeterminate. Right Ventricle: The right ventricular size is normal. Right vetricular wall thickness was not assessed. Right ventricular systolic function is normal. There is mildly elevated pulmonary artery systolic pressure. The tricuspid regurgitant velocity is 3.07 m/s, and with an assumed right atrial pressure of 3 mmHg, the estimated right ventricular systolic pressure is 40.7 mmHg. Left Atrium: Left atrial size was severely dilated. Right Atrium: Right atrial size was mildly dilated. Pericardium: There is no evidence of pericardial effusion. Mitral Valve: The mitral valve is abnormal. There is mild thickening of the mitral valve leaflet(s). Moderate to severe mitral annular calcification. Moderate mitral valve regurgitation. Tricuspid Valve: The tricuspid valve is normal in structure. Tricuspid valve regurgitation is mild to moderate. Aortic Valve: AV is thickened, calcified with restricted motion Peak and mean gradients through the valve are 57 and 38 mm Hg respectively THis is consistent with moderate/severe to severe AS Dimensionless index is 0.15 consistent with critical AS COmpareed to echo images from 2018, AV is thicker, more calcified and the gradients are increased. The aortic valve is abnormal. Aortic valve regurgitation is mild.  Aortic regurgitation PHT measures 228 msec. Aortic valve mean gradient measures 33.8 mmHg. Aortic valve peak gradient measures 53.9 mmHg. Aortic valve area, by VTI measures 0.64 cm. Pulmonic Valve: The pulmonic valve was not well visualized. Pulmonic valve regurgitation is not visualized. Aorta: The aortic root is normal in size and  structure. IAS/Shunts: No atrial level shunt detected by color flow Doppler.  LEFT VENTRICLE PLAX 2D LVIDd:         3.80 cm LVIDs:         3.50 cm LV PW:         1.65 cm LV IVS:        1.30 cm LVOT diam:     2.30 cm LV SV:         45 LV SV Index:   27 LVOT Area:     4.15 cm  LEFT ATRIUM              Index       RIGHT ATRIUM           Index LA diam:        3.90 cm  2.32 cm/m  RA Area:     21.90 cm LA Vol (A2C):   144.0 ml 85.84 ml/m RA Volume:   65.90 ml  39.29 ml/m LA Vol (A4C):   112.0 ml 66.77 ml/m LA Biplane Vol: 128.0 ml 76.31 ml/m  AORTIC VALVE AV Area (Vmax):    0.79 cm AV Area (Vmean):   0.68 cm AV Area (VTI):     0.64 cm AV Vmax:           367.25 cm/s AV Vmean:          273.250 cm/s AV VTI:            0.705 m AV Peak Grad:      53.9 mmHg AV Mean Grad:      33.8 mmHg LVOT Vmax:         69.78 cm/s LVOT Vmean:        44.700 cm/s LVOT VTI:          0.109 m LVOT/AV VTI ratio: 0.15 AI PHT:            228 msec MITRAL VALVE               TRICUSPID VALVE MV Area (PHT): 5.86 cm    TR Peak grad:   37.7 mmHg MV Decel Time: 130 msec    TR Vmax:        307.00 cm/s MR Peak grad: 128.1 mmHg MR Mean grad: 86.0 mmHg    SHUNTS MR Vmax:      566.00 cm/s  Systemic VTI:  0.11 m MR Vmean:     446.0 cm/s   Systemic Diam: 2.30 cm MV E velocity: 98.55 cm/s Dietrich Pates MD Electronically signed by Dietrich Pates MD Signature Date/Time: 05/14/2020/11:46:22 AM    Final    VAS US CAROTID (at [facility] and [facility] only)  Result Date: 05/13/2020 Carotid Arterial Duplex Study Indications:      TIA. Risk Factors:     Hypertension, hyperlipidemia, Diabetes, coronary artery                   disease. Comparison Study: 02/21/17 previous Performing Technologist: Blanch Media RVS  Examination Guidelines: A complete evaluation includes B-mode imaging, spectral Doppler, color Doppler, and power Doppler as needed of all accessible portions of each vessel. Bilateral testing is considered an integral part of a  complete examination. Limited examinations for  reoccurring indications may be performed as noted.  Right Carotid Findings: +----------+--------+--------+--------+-------------------+--------------------+           PSV cm/sEDV cm/sStenosisPlaque Description Comments             +----------+--------+--------+--------+-------------------+--------------------+ CCA Prox  86      21              heterogenous                            +----------+--------+--------+--------+-------------------+--------------------+ CCA Distal95      14              heterogenous                            +----------+--------+--------+--------+-------------------+--------------------+ ICA Prox  124     27      1-39%   irregular, calcificVelocities may                                         and heterogenous   underestimate degree                                                      of stenosis due to                                                        more proximal                                                             obstruction.         +----------+--------+--------+--------+-------------------+--------------------+ ICA Distal92      38                                                      +----------+--------+--------+--------+-------------------+--------------------+ ECA       263                                                             +----------+--------+--------+--------+-------------------+--------------------+ +----------+--------+-------+--------+-------------------+           PSV cm/sEDV cmsDescribeArm Pressure (mmHG) +----------+--------+-------+--------+-------------------+ Subclavian122                                        +----------+--------+-------+--------+-------------------+ +---------+--------+--------+--------------+ VertebralPSV cm/sEDV cm/sNot identified +---------+--------+--------+--------------+  Left Carotid Findings:  +----------+-------+-------+--------+---------------------------------+--------+  PSV    EDV    StenosisPlaque Description               Comments           cm/s   cm/s                                                     +----------+-------+-------+--------+---------------------------------+--------+ CCA Prox  118    16             heterogenous                              +----------+-------+-------+--------+---------------------------------+--------+ CCA Distal79     11             heterogenous                              +----------+-------+-------+--------+---------------------------------+--------+ ICA Prox  221    49     40-59%  irregular, calcific and                                                   heterogenous                              +----------+-------+-------+--------+---------------------------------+--------+ ICA Mid   132    30                                                       +----------+-------+-------+--------+---------------------------------+--------+ ICA Distal88     21                                                       +----------+-------+-------+--------+---------------------------------+--------+ ECA       140                                                             +----------+-------+-------+--------+---------------------------------+--------+ +----------+--------+--------+--------+-------------------+           PSV cm/sEDV cm/sDescribeArm Pressure (mmHG) +----------+--------+--------+--------+-------------------+ Subclavian70                                          +----------+--------+--------+--------+-------------------+ +---------+--------+--+--------+-+---------+ VertebralPSV cm/s46EDV cm/s9Antegrade +---------+--------+--+--------+-+---------+   Summary: Right Carotid: Velocities in the right ICA are consistent with a 1-39% stenosis. Left Carotid: Velocities in the left ICA are  consistent with a 40-59% stenosis. Vertebrals: Left vertebral artery demonstrates antegrade flow. Right vertebral  artery was not visualized. *See table(s) above for measurements and observations.  Electronically signed by Coral Else MD on 05/13/2020 at 7:12:03 PM.    Final     PHYSICAL EXAM GENERAL: Pleasant elderly Caucasian lady not in distress. HEENT: - Normocephalic and atraumatic. LUNGS - Symmetrical chest rise, No labored breathing noted CV - no JVD, No Peripheral Edema ABDOMEN - Soft,  nondistended  Ext: warm, well perfused, intact peripheral pulses, no Peripheral edema.  NEURO Exam:   Mental Status: AA& Oriented to self, age, place, situation . Not oriented to time. Doesn't know date, month.  Diminished attention and Concentration Language: speech is mildly dysarthric (at baseline per daughter).  Pt is able to name simple objects, fluency, and comprehension intact.  No aphasia. Cranial Nerves:   CN II Pupils equal and reactive to light, no VF deficits.   CN III,IV,VI EOM intact, no gaze preference or deviation, no nystagmus.   CN V normal sensation in V1, V2, and V3 segments bilaterally.   CN VII no asymmetry, no nasolabial fold flattening.   CN VIII normal hearing to speech.   CN IX & X normal palatal elevation, no uvular deviation.   CN XI 5/5 head turn and 5/5 shoulder shrug bilaterally.   CN XII midline tongue protrusion   Motor: No Drift in Upper Extremities, No Drift in LE's. Strength 4/5 in Rt UE, 4/5 in Lt UE, limited movements at shoulders and hands due to Rheumatoid arthritis. Strength in Rt LE  4/5, Lt LE 4/5 Tone: is normal and bulk is normal.  Sensation- Intact to light touch bilaterally. Coordination: FTN intact bilaterally, no ataxia. Gait- deferred.  ASSESSMENT/PLAN Ms. [name] is a 87 y.o. female with history significant for vascular dementia, hypothyroidism, stage IV chronic kidney disease, hypertension, hyperlipidemia, atrial  fibrillation on Eliquis, coronary artery disease, and chronic diastolic heart failure who presented to [facility] on 05/13[facility]20 by EMS for evaluation after an episode this morning of unresponsiveness with eyes open and right eye fixation and right-sided facial droop.  Patient initially admitted with episode of unresponsiveness with eyes open and right eye fixation and right-sided facial droop.  Her symptoms have resolved on arrival to the hospital. CT negative for acute intracranial abnormality.  MRI brain shows acute infarct in cortex and subcortical white matter of the posterior left frontal lobe chronic lacunar infarcts within the bilateral coronary radiata/basal ganglia and right thalamus and Scattered supratentorial> infratentorial chronic microhemorrhages.  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  Also shows multiple aneurysms.  ECHO shows LVEF of 55 to 60% severely dilated left atrium and mildly dilated right atrium, and calcified and thicker AV.    Stroke (acute infarct in cortex and subcortical white matter of the posterior left frontal lobe of embolic etiology from atrial fibrillation despite being on anticoagulation.     Code Stroke CT Head- Mild chronic ischemic white matter disease. No acute intracranial abnormality seen.  CTA head & neck Not ordered.  CTA head- not ordered.  CTA neck -Not ordered.  CT perfusion- Not ordered.  MRI -  Four subcentimeter acute infarcts within the cortex and subcortical white matter of the posterior left frontal lobe (MCA territory and MCA/ACA watershed territory). Chronic lacunar infarcts within the bilateral corona radiata/basal ganglia and right thalamus. Scattered supratentorial greater than infratentorial chronic microhemorrhages, which may reflect sequela of hypertensive microangiopathy and/or cerebral amyloid angiopathy.  MRA - No intracranial large vessel occlusion. 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 Developmentally diminutive vertebral and proximal basilar arteries. Multiple intracranial aneurysms.   Carotid Doppler - left ICA are consistent with a 40-59% stenosis. right ICA are consistent with a 1-39%  stenosis.   2D Echo - LVEF 55-60%. Left atrium severely dilated.  Right atrium mildly dilated.  Moderate MV regurgitation.  Thicker and calcified AV with restricted motion.  LDL 57  HgbA1c 7.6  VTE prophylaxis -None. on Eliquis for A fib.     Diet   Diet heart healthy/carb modified Room service appropriate? Yes; Fluid consistency: Thin    Eliquis (apixaban) daily prior to admission, now on aspirin 325 mg daily and Eliquis (apixaban) daily.  Therapy recommendations:  SNF  Disposition:  TBD  Hypertension  Home meds:  Toprol XL 25 mg Daily.   Stable . Long-term BP goal normotensive  Hyperlipidemia  Home meds:  None, Lipitor 40 in the hospital.   LDL 57, goal < 70  Continue statin at discharge  Diabetes type II Controlled  Home meds:  None  HgbA1c 7.6, goal < 7.0  CBGs Recent Labs    05/13/20 2110 05/14/20 0911 05/14/20 1230  GLUCAP 184* 115* 149*      SSI with HS coverage in the Hospital.   Atrial Fibrillation On Eliquis- Pt currently on 2.5 mg BID Eliquis.   Congestive heart failure- Vitals stable. Pt on Diltezem 180 mg Daily.  Other Stroke Risk Factors  Advanced Age > 68.   Coronary artery disease. Stable.   Other Active Problems  RA  Stage 4 CKD- Creatinine 1.59, GFR low at 31.    Hospital day # 0  I have personally obtained history,examined this patient, reviewed notes, independently viewed imaging studies, participated in medical decision making and plan of care.ROS completed by me personally and pertinent positives fully documented  I have made any additions or clarifications directly to the above note. Agree with note above.  She has presented with transient altered mental status and facial droop to be embolic  left MCA cortical and subcortical infarcts from atrial fibrillation despite being on anticoagulation with Eliquis and aspirin.  I had a long discussion with patient and daughter and answered questions.  She requested a return to her baseline.  I discussed treatment options for anticoagulation including Eliquis or switching to alternative medication like Pradaxa.  I do not believe additional aspirin is necessary as it may increase the bleeding risk without necessarily having proven efficacy.  We will ask social worker to look at her co-pay for Pradaxa and if it is not significantly higher than Eliquis will consider switching otherwise she will stay on Eliquis for treatment full dose 5 mg twice daily rather than 2.5 twice daily which he is taking.  Patient will be transferred back to skilled nursing facility later today.  Long discussion with patient and with Dr. Dorcas Carrow and answered questions.  Greater than 50% time during this 35-minute visit was spent in counseling coordination of care about embolic strokes discussion about anticoagulation options risk infarct  Delia Heady, MD Medical Director [facility] Pager: [phone] 05/14/2020 1:47 PM   To contact Stroke Continuity provider, please refer to [email or web address]. After hours, contact General Neurology

## 2020-05-14 NOTE — Progress Notes (Signed)
  Echocardiogram 2D Echocardiogram has been performed.  Leta Jungling M 05/14/2020, 8:47 AM

## 2020-05-14 NOTE — Evaluation (Signed)
Physical Therapy Evaluation Patient Details Name: [name] MRN: [id number] DOB: May 20, 1929 Today's Date: 05/14/2020   History of Present Illness  87 y.o. SNF LTC resident presenting with AMS. Patient found to have TIA and UTI. PMHx significant for vascular dementia, hypothyroidism, stage 4 CKD, HTN, HLD, DM, A-fib on Eliquis, RA, CAD, and CHF.  Clinical Impression  Pt presents to PT with deficits in functional mobility, gait, balance, endurance, ROM, cognition, strength, power. Pt is not far from baseline per daughter but this is difficult to fully assess without upright walker. Pt demonstrates a strong posterior lean with transfer attempts as she is unable to grip RW due to RA. Pt requires assistance to mobilize RW and to prevent LOB when stepping. Pt will benefit from assessment of gait with EVA or bilateral platform walker next session, as these are more similar to her normal assistive device. PT recommends SNF placement at this time due to increased falls risk.    Follow Up Recommendations SNF    Equipment Recommendations  None recommended by PT    Recommendations for Other Services       Precautions / Restrictions Precautions Precautions: Fall Precaution Comments: High fall risk, incontinent x2, wears brief at baseline Restrictions Weight Bearing Restrictions: No      Mobility  Bed Mobility Overal bed mobility: Needs Assistance Bed Mobility: Supine to Sit     Supine to sit: Mod assist     General bed mobility comments: Patient seated in recliner upon entry.    Transfers Overall transfer level: Needs assistance Equipment used: 1 person hand held assist Transfers: Sit to/from [employer] Sit to Stand: Mod assist Stand pivot transfers: Min assist;Mod assist       General transfer comment: Mod A for sit to stand from low recliner and Mod A for stand-pivot to [facility]. Patient requires increased time and cues for foot/hand  placement.  Ambulation/Gait                Stairs            Wheelchair Mobility    Modified Rankin (Stroke Patients Only) Modified Rankin (Stroke Patients Only) Pre-Morbid Rankin Score: Moderately severe disability Modified Rankin: Moderately severe disability     Balance Overall balance assessment: Needs assistance Sitting-balance support: Single extremity supported;Feet supported Sitting balance-Leahy Scale: Fair Sitting balance - Comments: Able to maintain unsupported static sitting balance without external assist. Postural control: Posterior lean Standing balance support: Bilateral upper extremity supported;During functional activity Standing balance-Leahy Scale: Poor Standing balance comment: Reliant on BUE support.                             Pertinent Vitals/Pain Pain Assessment: No/denies pain Faces Pain Scale: No hurt    Home Living Family/patient expects to be discharged to:: Skilled nursing facility                 Additional Comments: [facility]. Family wants to find a different facility at the time of discharge    Prior Function Level of Independence: Needs assistance   Gait / Transfers Assistance Needed: Ambulates with use of standing walker and some external assist from staff/family.  ADL's / Homemaking Assistance Needed: Assist with all self-care tasks with the exception of self-feeding.        Hand Dominance   Dominant Hand: Right    Extremity/Trunk Assessment   Upper Extremity Assessment Upper Extremity Assessment: Generalized weakness (Boutonniere deformity of most  digits bilaterally. Difficulty holding onto standard RW.) RUE Deficits / Details: shoulder flexion ROM limited to ~45 degrees AROM, significant RA present affecting fingers and wrists in UEs LUE Deficits / Details: shoulder flexion ROM limited to ~45 degrees AROM, significant RA present affecting fingers and wrists in UEs    Lower Extremity  Assessment Lower Extremity Assessment: Defer to PT evaluation    Cervical / Trunk Assessment Cervical / Trunk Assessment: Kyphotic  Communication   Communication: No difficulties  Cognition Arousal/Alertness: Awake/alert Behavior During Therapy: WFL for tasks assessed/performed Overall Cognitive Status: History of cognitive impairments - at baseline                                 General Comments: Daughter notes that patient can typically recall name and sometimes DOB. Usually not oriented to current date. Patient altert and oriented to name only this date.      General Comments General comments (skin integrity, edema, etc.): Daughter present at bedside.    Exercises     Assessment/Plan    PT Assessment Patient needs continued PT services  PT Problem List Decreased strength;Decreased range of motion;Decreased activity tolerance;Decreased balance;Decreased mobility;Decreased cognition;Decreased knowledge of use of DME;Decreased safety awareness;Decreased knowledge of precautions       PT Treatment Interventions DME instruction;Gait training;Functional mobility training;Therapeutic activities;Therapeutic exercise;Balance training;Neuromuscular re-education;Cognitive remediation;Patient/family education    PT Goals (Current goals can be found in the Care Plan section)  Acute Rehab PT Goals Patient Stated Goal: Per daughter, return to SNF PT Goal Formulation: With patient/family Time For Goal Achievement: 05/28/20 Potential to Achieve Goals: Good    Frequency Min 3X/week   Barriers to discharge        Co-evaluation               AM-PAC PT "6 Clicks" Mobility  Outcome Measure Help needed turning from your back to your side while in a flat bed without using bedrails?: A Lot Help needed moving from lying on your back to sitting on the side of a flat bed without using bedrails?: A Lot Help needed moving to and from a bed to a chair (including a  wheelchair)?: A Lot Help needed standing up from a chair using your arms (e.g., wheelchair or bedside chair)?: A Lot Help needed to walk in hospital room?: A Lot Help needed climbing 3-5 steps with a railing? : Total 6 Click Score: 11    End of Session Equipment Utilized During Treatment: Gait belt Activity Tolerance: Patient tolerated treatment well Patient left: in chair;with call bell/phone within reach;with chair alarm set;with family/visitor present Nurse Communication: Mobility status PT Visit Diagnosis: Unsteadiness on feet (R26.81);Other abnormalities of gait and mobility (R26.89);Muscle weakness (generalized) (M62.81)    Time: 7654-6503 PT Time Calculation (min) (ACUTE ONLY): 26 min   Charges:   PT Evaluation $PT Eval Low Complexity: 1 Low          Arlyss Gandy, PT, DPT Acute Rehabilitation Pager: [phone]   Arlyss Gandy 05/14/2020, 11:04 AM

## 2020-05-14 NOTE — [place] FL2 (Signed)
[place] MEDICAID FL2 LEVEL OF CARE SCREENING TOOL     IDENTIFICATION  Patient Name: [name] Birthdate: April 06, 1930 Sex: female Admission Date (Current Location): 05/13/2020  [facility] and [place] Number:  Producer, television/film/video and Address:  [facility], [address] [place], [place] [address]      Provider Number: [id number]  Attending Physician Name and Address:  Dorcas Carrow, MD  Relative Name and Phone Number:       Current Level of Care: Hospital Recommended Level of Care: Skilled Nursing Facility Prior Approval Number:    Date Approved/Denied:   PASRR Number: [id number]  Discharge Plan: SNF    Current Diagnoses: Patient Active Problem List   Diagnosis Date Noted  . TIA (transient ischemic attack) 05/13/2020  . Vascular dementia, uncomplicated (HCC) 05/13/2020  . AKI (acute kidney injury) (HCC) 01/17/2018  . MDD (major depressive disorder) 01/15/2018  . Macrocytic anemia 09/22/2017  . Proteinuria 04/06/2017  . Insomnia 12/01/2016  . CAD (coronary artery disease) 11/17/2016  . Hyperlipidemia 11/17/2016  . Hypertension 11/17/2016  . Aortic atherosclerosis (HCC) 11/03/2016  . CKD (chronic kidney disease) stage 3, GFR 30-59 ml/min (HCC) 04/14/2015  . Colonic constipation 04/14/2015  . Frequent UTI 04/01/2015  . Diabetes mellitus with renal complications (HCC) 03/03/2015  . Atrophic vaginitis 02/24/2015  . Rheumatoid arthritis (HCC) 02/18/2015  . DNR (do not resuscitate) 02/13/2015  . Occlusion and stenosis of carotid artery without mention of cerebral infarction 05/02/2013  . 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 FEMORAL BRUIT 11/24/2009  . Hypothyroidism 06/26/2009  . CEREBROVASCULAR DISEASE 06/25/2009  . ATRIAL FIBRILLATION 05/19/2008  . Chronic diastolic heart failure (HCC) 05/19/2008  . RENAL ARTERY STENOSIS 05/19/2008  . PERIPHERAL VASCULAR DISEASE 05/19/2008    Orientation RESPIRATION BLADDER Height  & Weight     Self,Time,Place  Normal Incontinent Weight: 66.7 kg Height:  5\' 2"  (157.5 cm)  BEHAVIORAL SYMPTOMS/MOOD NEUROLOGICAL BOWEL NUTRITION STATUS      Continent Diet (heart healthy/ carb modified with thin liquids)  AMBULATORY STATUS COMMUNICATION OF NEEDS Skin   Extensive Assist Verbally Normal                       Personal Care Assistance Level of Assistance  Bathing,Feeding,Dressing Bathing Assistance: Maximum assistance Feeding assistance: Limited assistance Dressing Assistance: Maximum assistance     Functional Limitations Info  Sight,Hearing,Speech Sight Info: Impaired Hearing Info: Impaired Speech Info: Adequate    SPECIAL CARE FACTORS FREQUENCY  PT (By licensed PT),OT (By licensed OT),Speech therapy     PT Frequency: 5x/wk OT Frequency: 5x/wk     Speech Therapy Frequency: 5x/wk      Contractures Contractures Info: Not present    Additional Factors Info  Code Status,Allergies,Psychotropic,Insulin Sliding Scale Code Status Info: DNR Allergies Info: Cefuroxime/ Fish/ Ciprofloxacin/ codeine/ sertraline/ cephalexin/ paxil Psychotropic Info: prozac 20 mg daily/ Namenda 10 mg at bedtime/ Remeron 7.5 mg at bedtime/ Ultram 50 mg BID Insulin Sliding Scale Info: Novolog 0-5 units SQ at bedtime/ Novolog 0-9 units SQ three times a day       Current Medications (05/14/2020):  This is the current hospital active medication list Current Facility-Administered Medications  Medication Dose Route Frequency Provider Last Rate Last Admin  . 0.9 %  sodium chloride infusion   Intravenous Continuous 07/12/2020, MD 50 mL/hr at 05/13/20 2037 New Bag at 05/13/20 2037  . acetaminophen (TYLENOL) tablet 650 mg  650 mg Oral Q4H PRN 2038, MD  650 mg at 05/14/20 1011   Or  . acetaminophen (TYLENOL) 160 MG/5ML solution 650 mg  650 mg Per Tube Q4H PRN Jonah Blue, MD       Or  . acetaminophen (TYLENOL) suppository 650 mg  650 mg Rectal Q4H PRN Jonah Blue, MD      . apixaban Everlene Balls) tablet 2.5 mg  2.5 mg Oral BID Jonah Blue, MD   2.5 mg at 05/14/20 0959  . aspirin suppository 300 mg  300 mg Rectal Daily Jonah Blue, MD       Or  . aspirin tablet 325 mg  325 mg Oral Daily Jonah Blue, MD   325 mg at 05/14/20 0959  . atorvastatin (LIPITOR) tablet 40 mg  40 mg Oral Daily Jonah Blue, MD   40 mg at 05/14/20 0959  . docusate sodium (COLACE) capsule 100 mg  100 mg Oral BID Jonah Blue, MD   100 mg at 05/14/20 0959  . FLUoxetine (PROZAC) capsule 20 mg  20 mg Oral Daily Jonah Blue, MD   20 mg at 05/14/20 0959  . insulin aspart (novoLOG) injection 0-5 Units  0-5 Units Subcutaneous QHS Jonah Blue, MD      . insulin aspart (novoLOG) injection 0-9 Units  0-9 Units Subcutaneous TID WC Jonah Blue, MD   1 Units at 05/13/20 1939  . levothyroxine (SYNTHROID) tablet 75 mcg  75 mcg Oral QAC breakfast Jonah Blue, MD   75 mcg at 05/14/20 0550  . memantine (NAMENDA) tablet 10 mg  10 mg Oral Noemi Chapel, MD   10 mg at 05/13/20 2131  . methenamine (MANDELAMINE) tablet 500 mg  500 mg Oral BID Dorcas Carrow, MD      . mirtazapine (REMERON) tablet 7.5 mg  7.5 mg Oral QHS Jonah Blue, MD   7.5 mg at 05/13/20 2130  . polyethylene glycol (MIRALAX / GLYCOLAX) packet 17 g  17 g Oral Daily Jonah Blue, MD   17 g at 05/14/20 0959  . polyvinyl alcohol (LIQUIFILM TEARS) 1.4 % ophthalmic solution 1 drop  1 drop Both Eyes QID Jonah Blue, MD   1 drop at 05/14/20 1000  . senna-docusate (Senokot-S) tablet 1 tablet  1 tablet Oral QHS PRN Jonah Blue, MD      . traMADol Janean Sark) tablet 50 mg  50 mg Oral BID Jonah Blue, MD   50 mg at 05/14/20 6384     Discharge Medications: Please see discharge summary for a list of discharge medications.  Relevant Imaging Results:  Relevant Lab Results:   Additional Information SS# - [id number]  Kermit Balo, RN

## 2020-05-14 NOTE — Progress Notes (Signed)
ANTICOAGULATION CONSULT NOTE - Follow Up Consult  Pharmacy Consult for Eliquis > Pradaxa Indication: atrial fibrillation  Allergies  Allergen Reactions  . Cefuroxime Anaphylaxis and Other (See Comments)    Throat swelling - MAR  . Fish Allergy Shortness Of Breath    Pt said she has throat swelling   . Ciprofloxacin Rash    Per MAR  . Codeine Other (See Comments)    Jittery - Per MAR  . Sertraline Hcl Rash    Per MAR  . Cephalexin Rash    Per MAR  . Paxil [Paroxetine Hcl] Other (See Comments)    Delirium while in hospital reports of hallucinations.    Patient Measurements: Height: 5\' 2"  (157.5 cm) Weight: 66.7 kg (147 lb 0.8 oz) IBW/kg (Calculated) : 50.1  Vital Signs: Temp: 97.9 F (36.6 C) (02/03 1153) Temp Source: Oral (02/03 1153) BP: 123/73 (02/03 1153) Pulse Rate: 73 (02/03 1153)  Labs: Recent Labs    05/13/20 1229  HGB 11.4*  HCT 36.7  PLT 328  CREATININE 1.59*    Estimated Creatinine Clearance: 21.9 mL/min (A) (by C-G formula based on SCr of 1.59 mg/dL (H)).  Assessment:  87 yr old female on Eliquis 5 mg BID for atrial fibrillation. Admitted 2/2 with TIA.  To transition to Pradaxa.    CKD IV. Scr 1.59 on admit 2/2.  Unsure of baseline.   Eliquis was decreased to 2.5 mg BID on 2/2 with Scr >1.5 and age >55.   Last Eliquis dose at 10am today. ASA 325 mg stopped.  Goal of Therapy:  appropriate Pradaxa dose for indication Monitor platelets by anticoagulation protocol: Yes   Plan:  Discontinue Eliquis. Begin Pradaxa 75 mg BID at 10pm tonight. For crcl 15-30 mg/ml.  Will follow renal function for any need to adjust regimen.  >83, RPh 05/14/2020,2:31 PM

## 2020-05-14 NOTE — Progress Notes (Signed)
Initial Nutrition Assessment  DOCUMENTATION CODES:   Not applicable  INTERVENTION:   -Ensure Enlive BID, each supplement provides 350 kcal, 20 g protein  -Encourage PO intake  NUTRITION DIAGNOSIS:   Increased nutrient needs related to chronic illness (diastolic CHF) as evidenced by estimated needs.  GOAL:   Patient will meet greater than or equal to 90% of their needs  MONITOR:   PO intake,Supplement acceptance,Skin,Labs,Weight trends,I & O's  REASON FOR ASSESSMENT:   Consult Assessment of nutrition requirement/status  ASSESSMENT:   52 YOF presented with AMS and admitted for TIA. PMH of DM, hypothyroidism, HTN, A. Fib, diastolic CHF, GERD, CAD, HLD, generalized muscle weakness, CKD 4, vascular dementia.   Pt told intern that she had good appetite PTA and typically ate 3 meals a day. She also said that she has had a good appetite since being in the hospital. She had eaten approximately 90% her breakfast this morning which consisted of a yogurt, juice, toast, eggs, sausage and coffee. Pt mentioned to intern that she is able to feed herself with no problems.  Pt told intern that she had not previously consumed ONS, but was agreeable to them. Intern discussed with pt the benefits of ONS such as increased protein which will aid in the healing process and speed up recovery.   Pt denied any stomach pain, nausea, vomiting, diarrhea nor any chewing or swallowing difficulties.   Pt's weight was taken on 05/13/20 and pt weighed 147#. However, pt did not have any weight history since 02/11/18 when she weighed 147#. Pt told intern her UBW was 150#.   Meds Reviewed: Colace (100 mg, BID), Miralaz (17 g, daily).   Labs Reviewed: glucose (115 mg/dL), HDL (27 mg/dL), V9D (6.3%)  NUTRITION - FOCUSED PHYSICAL EXAM:  Flowsheet Row Most Recent Value  Orbital Region No depletion  Upper Arm Region No depletion  Thoracic and Lumbar Region No depletion  Buccal Region Mild depletion  Temple  Region No depletion  Clavicle Bone Region No depletion  Clavicle and Acromion Bone Region Mild depletion  Scapular Bone Region No depletion  Dorsal Hand No depletion  Patellar Region No depletion  Anterior Thigh Region No depletion  Posterior Calf Region No depletion  Edema (RD Assessment) Mild  [lower right leg]  Hair Reviewed  Eyes Reviewed  Mouth Reviewed  Skin Reviewed  Nails Reviewed       Diet Order:   Diet Order            Diet heart healthy/carb modified Room service appropriate? Yes; Fluid consistency: Thin  Diet effective ____                 EDUCATION NEEDS:   No education needs have been identified at this time  Skin:  Skin Assessment: Reviewed RN Assessment  Last BM:  unknown.  Height:   Ht Readings from Last 1 Encounters:  05/13/20 5\' 2"  (1.575 m)    Weight:   Wt Readings from Last 1 Encounters:  05/13/20 66.7 kg    Ideal Body Weight:  50 kg  BMI:  Body mass index is 26.9 kg/m.  Estimated Nutritional Needs:   Kcal:  1650-1850   Protein:  75-90 g   Fluid:  >/= 1.65 L    07/11/20, Dietetic Intern 05/14/2020 2:43 PM

## 2020-05-14 NOTE — Plan of Care (Signed)

## 2020-05-14 NOTE — TOC Initial Note (Addendum)
Transition of Care [facility]) - Initial/Assessment Note    Patient Details  Name: [name] MRN: [id number] Date of Birth: Oct 05, 1929  Transition of Care [facility]) CM/SW Contact:    Pollie Friar, RN Phone Number: 05/14/2020, 11:34 AM  Clinical Narrative:                 3382: Caryn Section again at [facility] and still not available.   1254: Voicemail left for [facility] admissions.   11:34:  CM met with the patient and her daughter in the patients room. Pt is a LT resident of [facility]. Daughter is interested in getting her into another facility. CM has updated her that if she returns as LT she will need to d/c back to [facility] unless she has a facility already willing to accept her. If she needs therapies at d/c we can fax her to her preferred facilities. Daughter is asking for [facility]. CM has completed FL2 and sent it to [facility]. CM will f/u with the facility.  TOC following.  Expected Discharge Plan: Skilled Nursing Facility Barriers to Discharge: Continued Medical Work up   Patient Goals and CMS Choice   CMS Medicare.gov Compare Post Acute Care list provided to:: Patient Represenative (must comment) Choice offered to / list presented to : Adult Children,Patient  Expected Discharge Plan and Services Expected Discharge Plan: [place] In-house Referral: Clinical Social Work Discharge Planning Services: CM Consult Post Acute Care Choice: [place] Living arrangements for the past 2 months: [place]                                      Prior Living Arrangements/Services Living arrangements for the past 2 months: [place] Lives with:: Facility Resident Patient language and need for interpreter reviewed:: Yes Do you feel safe going back to the place where you live?: Yes      Need for Family Participation in Patient Care: Yes (Comment) Care giver support system in place?: Yes  (comment)   Criminal Activity/Legal Involvement Pertinent to Current Situation/Hospitalization: No - Comment as needed  Activities of Daily Living      Permission Sought/Granted                  Emotional Assessment Appearance:: Appears stated age     Orientation: : Oriented to Self,Oriented to Place,Oriented to  Time   Psych Involvement: No (comment)  Admission diagnosis:  TIA (transient ischemic attack) [G45.9] Patient Active Problem List   Diagnosis Date Noted  . TIA (transient ischemic attack) 05/13/2020  . Vascular dementia, uncomplicated ([place]) 50/53/9767  . AKI (acute kidney injury) ([place]) 01/17/2018  . MDD (major depressive disorder) 01/15/2018  . Macrocytic anemia 09/22/2017  . Proteinuria 04/06/2017  . Insomnia 12/01/2016  . CAD (coronary artery disease) 11/17/2016  . Hyperlipidemia 11/17/2016  . Hypertension 11/17/2016  . Aortic atherosclerosis ([place]) 11/03/2016  . CKD (chronic kidney disease) stage 3, GFR 30-59 ml/min (HCC) 04/14/2015  . Colonic constipation 04/14/2015  . Frequent UTI 04/01/2015  . Diabetes mellitus with renal complications ([place]) 34/19/3790  . Atrophic vaginitis 02/24/2015  . Rheumatoid arthritis ([place]) 02/18/2015  . DNR (do not resuscitate) 02/13/2015  . Occlusion and stenosis of carotid artery without mention of cerebral infarction 05/02/2013  . Mesenteric artery insufficiency ([place]) 02/14/2013  . FEMORAL BRUIT 11/24/2009  . Hypothyroidism 06/26/2009  . CEREBROVASCULAR DISEASE 06/25/2009  .  ATRIAL FIBRILLATION 05/19/2008  . Chronic diastolic heart failure ([place]) 05/19/2008  . RENAL ARTERY STENOSIS 05/19/2008  . PERIPHERAL VASCULAR DISEASE 05/19/2008   PCP:  Seward Carol, MD Pharmacy:  No Pharmacies Listed    Social Determinants of Health (SDOH) Interventions    Readmission Risk Interventions No flowsheet data found.

## 2020-05-14 NOTE — Progress Notes (Signed)
Routine EEG completed, Result pending.

## 2020-05-14 NOTE — Evaluation (Signed)
Occupational Therapy Evaluation Patient Details Name: [name] MRN: [id number] DOB: [birth year] Today's Date: 05/14/2020    History of Present Illness 87 y.o. SNF LTC resident presenting with AMS. Patient found to have TIA and UTI. PMHx significant for vascular dementia, hypothyroidism, stage 4 CKD, HTN, HLD, DM, A-fib on Eliquis, RA, CAD, and CHF.   Clinical Impression   PTA patient was a LTC resident at a SNF where she was requiring assistance with self-care tasks with the exception of self-feeding at baseline. Patient with cognitive deficits at baseline 2/2 vascular dementia on top of new onset UTI and is unable to provide PLOF information. Daughter present at bedside reports that patient was ambulatory with use of standing walker and some external assist for sit to stand transfers. Patient currently functioning close to baseline requiring Mod A to Max A grossly for observed ADLs/ADL transfers. Patient with Hx of RA at baseline with Boutonnieres deformity bilaterally making it difficult to maintain position of hands on RW. Patient would benefit from continued acute OT services prior to anticipated d/c to SNF to decrease caregiver burden and maximize safety/independence.      Follow Up Recommendations  SNF (w/ OT services)    Equipment Recommendations  None recommended by OT    Recommendations for Other Services       Precautions / Restrictions Precautions Precautions: Fall Precaution Comments: High fall risk, incontinent x2, wears brief at baseline Restrictions Weight Bearing Restrictions: No      Mobility Bed Mobility Overal bed mobility: Needs Assistance Bed Mobility: Supine to Sit     Supine to sit: Mod assist     General bed mobility comments: Patient seated in recliner upon entry.    Transfers Overall transfer level: Needs assistance Equipment used: 1 person hand held assist Transfers: Sit to/from [employer] Sit to Stand: Mod assist Stand  pivot transfers: Min assist;Mod assist       General transfer comment: Mod A for sit to stand from low recliner and Mod A for stand-pivot to [facility] Patient requires increased time and cues for foot/hand placement.    Balance Overall balance assessment: Needs assistance Sitting-balance support: Single extremity supported;Feet supported Sitting balance-Leahy Scale: Fair Sitting balance - Comments: Able to maintain unsupported static sitting balance without external assist. Postural control: Posterior lean Standing balance support: Bilateral upper extremity supported;During functional activity Standing balance-Leahy Scale: Poor Standing balance comment: Reliant on BUE support.                           ADL either performed or assessed with clinical judgement   ADL Overall ADL's : At baseline                                       General ADL Comments: Patient grossly Mod A-Max A for observed ADLs this date. Patient observed during self-feeding with ability to complete meal with set-up assist. Daughter notes that patient is very close to her baseline.     Vision Baseline Vision/History: Wears glasses Wears Glasses: At all times Patient Visual Report: No change from baseline Vision Assessment?: No apparent visual deficits     Perception     Praxis      Pertinent Vitals/Pain Pain Assessment: No/denies pain Faces Pain Scale: No hurt     Hand Dominance Right   Extremity/Trunk Assessment Upper Extremity Assessment Upper Extremity Assessment:  Generalized weakness (Boutonniere deformity of most digits bilaterally. Difficulty holding onto standard RW.) RUE Deficits / Details: shoulder flexion ROM limited to ~45 degrees AROM, significant RA present affecting fingers and wrists in UEs LUE Deficits / Details: shoulder flexion ROM limited to ~45 degrees AROM, significant RA present affecting fingers and wrists in UEs   Lower Extremity Assessment Lower  Extremity Assessment: Defer to PT evaluation   Cervical / Trunk Assessment Cervical / Trunk Assessment: Kyphotic   Communication Communication Communication: No difficulties   Cognition Arousal/Alertness: Awake/alert Behavior During Therapy: WFL for tasks assessed/performed Overall Cognitive Status: History of cognitive impairments - at baseline                                 General Comments: Daughter notes that patient can typically recall name and sometimes DOB. Usually not oriented to current date. Patient altert and oriented to name only this date.   General Comments  Daughter present at bedside.    Exercises     Shoulder Instructions      Home Living Family/patient expects to be discharged to:: Skilled nursing facility                                 Additional Comments: [facility]. Family wants to find a different facility at the time of discharge      Prior Functioning/Environment Level of Independence: Needs assistance  Gait / Transfers Assistance Needed: Ambulates with use of standing walker and some external assist from staff/family. ADL's / Homemaking Assistance Needed: Assist with all self-care tasks with the exception of self-feeding.            OT Problem List: Decreased strength;Decreased activity tolerance;Impaired balance (sitting and/or standing);Impaired UE functional use      OT Treatment/Interventions: Self-care/ADL training;Therapeutic exercise;Energy conservation;DME and/or AE instruction;Therapeutic activities;Cognitive remediation/compensation;Patient/family education;Balance training    OT Goals(Current goals can be found in the care plan section) Acute Rehab OT Goals Patient Stated Goal: Per daughter, return to SNF OT Goal Formulation: With patient/family Time For Goal Achievement: 05/28/20 Potential to Achieve Goals: Fair ADL Goals Additional ADL Goal #1: Patient will tolerate 1 set x 10 reps each of BUE HEP  to improve strength in prep for ADLs. Additional ADL Goal #2: Patient will tolerate >10 min of therapeutic activity indicating increased activity tolerance.  OT Frequency: Min 2X/week   Barriers to D/C:            Co-evaluation              AM-PAC OT "6 Clicks" Daily Activity     Outcome Measure Help from another person eating meals?: A Little Help from another person taking care of personal grooming?: A Lot Help from another person toileting, which includes using toliet, bedpan, or urinal?: A Lot Help from another person bathing (including washing, rinsing, drying)?: A Lot Help from another person to put on and taking off regular upper body clothing?: A Lot Help from another person to put on and taking off regular lower body clothing?: A Lot 6 Click Score: 13   End of Session Equipment Utilized During Treatment: Gait belt;Rolling walker Nurse Communication: Mobility status  Activity Tolerance: Patient tolerated treatment well Patient left: with nursing/sitter in room;with family/visitor present (Seated on BSC. RN and daughter present)  OT Visit Diagnosis: Unsteadiness on feet (R26.81);Other abnormalities of gait and mobility (R26.89);Muscle  weakness (generalized) (M62.81)                Time: 1027-2536 OT Time Calculation (min): 22 min Charges:  OT General Charges $OT Visit: 1 Visit OT Evaluation $OT Eval Moderate Complexity: 1 Mod  Zyiah Withington H. OTR/L Supplemental OT, Department of rehab services [phone]  Rahmon Heigl R [place] 05/14/2020, 11:06 AM

## 2020-05-15 DIAGNOSIS — D649 Anemia, unspecified: Secondary | ICD-10-CM | POA: Diagnosis not present

## 2020-05-15 DIAGNOSIS — B001 Herpesviral vesicular dermatitis: Secondary | ICD-10-CM | POA: Diagnosis not present

## 2020-05-15 DIAGNOSIS — E1122 Type 2 diabetes mellitus with diabetic chronic kidney disease: Secondary | ICD-10-CM | POA: Diagnosis not present

## 2020-05-15 DIAGNOSIS — F0390 Unspecified dementia without behavioral disturbance: Secondary | ICD-10-CM | POA: Diagnosis not present

## 2020-05-15 DIAGNOSIS — I6782 Cerebral ischemia: Secondary | ICD-10-CM | POA: Diagnosis not present

## 2020-05-15 DIAGNOSIS — F0150 Vascular dementia without behavioral disturbance: Secondary | ICD-10-CM | POA: Diagnosis not present

## 2020-05-15 DIAGNOSIS — K219 Gastro-esophageal reflux disease without esophagitis: Secondary | ICD-10-CM | POA: Diagnosis not present

## 2020-05-15 DIAGNOSIS — I10 Essential (primary) hypertension: Secondary | ICD-10-CM | POA: Diagnosis not present

## 2020-05-15 DIAGNOSIS — F329 Major depressive disorder, single episode, unspecified: Secondary | ICD-10-CM | POA: Diagnosis not present

## 2020-05-15 DIAGNOSIS — E7849 Other hyperlipidemia: Secondary | ICD-10-CM | POA: Diagnosis not present

## 2020-05-15 DIAGNOSIS — I482 Chronic atrial fibrillation, unspecified: Secondary | ICD-10-CM | POA: Diagnosis not present

## 2020-05-15 DIAGNOSIS — E08621 Diabetes mellitus due to underlying condition with foot ulcer: Secondary | ICD-10-CM | POA: Diagnosis not present

## 2020-05-15 DIAGNOSIS — I639 Cerebral infarction, unspecified: Secondary | ICD-10-CM | POA: Diagnosis not present

## 2020-05-15 DIAGNOSIS — F419 Anxiety disorder, unspecified: Secondary | ICD-10-CM | POA: Diagnosis not present

## 2020-05-15 DIAGNOSIS — I2510 Atherosclerotic heart disease of native coronary artery without angina pectoris: Secondary | ICD-10-CM | POA: Diagnosis not present

## 2020-05-15 DIAGNOSIS — G4700 Insomnia, unspecified: Secondary | ICD-10-CM | POA: Diagnosis not present

## 2020-05-15 DIAGNOSIS — E11621 Type 2 diabetes mellitus with foot ulcer: Secondary | ICD-10-CM | POA: Diagnosis not present

## 2020-05-15 DIAGNOSIS — I4891 Unspecified atrial fibrillation: Secondary | ICD-10-CM | POA: Diagnosis not present

## 2020-05-15 DIAGNOSIS — R4701 Aphasia: Secondary | ICD-10-CM | POA: Diagnosis not present

## 2020-05-15 DIAGNOSIS — E119 Type 2 diabetes mellitus without complications: Secondary | ICD-10-CM | POA: Diagnosis not present

## 2020-05-15 DIAGNOSIS — L97422 Non-pressure chronic ulcer of left heel and midfoot with fat layer exposed: Secondary | ICD-10-CM | POA: Diagnosis not present

## 2020-05-15 DIAGNOSIS — Z20822 Contact with and (suspected) exposure to covid-19: Secondary | ICD-10-CM | POA: Diagnosis not present

## 2020-05-15 DIAGNOSIS — R2689 Other abnormalities of gait and mobility: Secondary | ICD-10-CM | POA: Diagnosis not present

## 2020-05-15 DIAGNOSIS — L97522 Non-pressure chronic ulcer of other part of left foot with fat layer exposed: Secondary | ICD-10-CM | POA: Diagnosis not present

## 2020-05-15 DIAGNOSIS — G459 Transient cerebral ischemic attack, unspecified: Secondary | ICD-10-CM | POA: Diagnosis not present

## 2020-05-15 DIAGNOSIS — F0281 Dementia in other diseases classified elsewhere with behavioral disturbance: Secondary | ICD-10-CM | POA: Diagnosis not present

## 2020-05-15 DIAGNOSIS — E039 Hypothyroidism, unspecified: Secondary | ICD-10-CM | POA: Diagnosis not present

## 2020-05-15 DIAGNOSIS — D6489 Other specified anemias: Secondary | ICD-10-CM | POA: Diagnosis not present

## 2020-05-15 DIAGNOSIS — I5032 Chronic diastolic (congestive) heart failure: Secondary | ICD-10-CM | POA: Diagnosis not present

## 2020-05-15 DIAGNOSIS — M6281 Muscle weakness (generalized): Secondary | ICD-10-CM | POA: Diagnosis not present

## 2020-05-15 DIAGNOSIS — N184 Chronic kidney disease, stage 4 (severe): Secondary | ICD-10-CM | POA: Diagnosis not present

## 2020-05-15 DIAGNOSIS — M069 Rheumatoid arthritis, unspecified: Secondary | ICD-10-CM | POA: Diagnosis not present

## 2020-05-15 DIAGNOSIS — F331 Major depressive disorder, recurrent, moderate: Secondary | ICD-10-CM | POA: Diagnosis not present

## 2020-05-15 DIAGNOSIS — I130 Hypertensive heart and chronic kidney disease with heart failure and stage 1 through stage 4 chronic kidney disease, or unspecified chronic kidney disease: Secondary | ICD-10-CM | POA: Diagnosis not present

## 2020-05-15 DIAGNOSIS — F411 Generalized anxiety disorder: Secondary | ICD-10-CM | POA: Diagnosis not present

## 2020-05-15 DIAGNOSIS — F0151 Vascular dementia with behavioral disturbance: Secondary | ICD-10-CM | POA: Diagnosis not present

## 2020-05-15 DIAGNOSIS — N183 Chronic kidney disease, stage 3 unspecified: Secondary | ICD-10-CM | POA: Diagnosis not present

## 2020-05-15 DIAGNOSIS — L97529 Non-pressure chronic ulcer of other part of left foot with unspecified severity: Secondary | ICD-10-CM | POA: Diagnosis not present

## 2020-05-15 LAB — BASIC METABOLIC PANEL
Anion gap: 8 (ref 5–15)
BUN: 36 mg/dL — ABNORMAL HIGH (ref 8–23)
CO2: 26 mmol/L (ref 22–32)
Calcium: 8.6 mg/dL — ABNORMAL LOW (ref 8.9–10.3)
Chloride: 106 mmol/L (ref 98–111)
Creatinine, Ser: 1.16 mg/dL — ABNORMAL HIGH (ref 0.44–1.00)
GFR, Estimated: 45 mL/min — ABNORMAL LOW (ref >60)
Glucose, Bld: 104 mg/dL — ABNORMAL HIGH (ref 70–99)
Potassium: 4 mmol/L (ref 3.5–5.1)
Sodium: 140 mmol/L (ref 135–145)

## 2020-05-15 LAB — CBC WITH DIFFERENTIAL/PLATELET
Abs Immature Granulocytes: 0.09 10*3/uL — ABNORMAL HIGH (ref 0.00–0.07)
Basophils Absolute: 0.1 10*3/uL (ref 0.0–0.1)
Basophils Relative: 1 %
Eosinophils Absolute: 0.3 10*3/uL (ref 0.0–0.5)
Eosinophils Relative: 3 %
HCT: 34.9 % — ABNORMAL LOW (ref 36.0–46.0)
Hemoglobin: 10.9 g/dL — ABNORMAL LOW (ref 12.0–15.0)
Immature Granulocytes: 1 %
Lymphocytes Relative: 27 %
Lymphs Abs: 2.4 10*3/uL (ref 0.7–4.0)
MCH: 30.5 pg (ref 26.0–34.0)
MCHC: 31.2 g/dL (ref 30.0–36.0)
MCV: 97.8 fL (ref 80.0–100.0)
Monocytes Absolute: 0.9 10*3/uL (ref 0.1–1.0)
Monocytes Relative: 10 %
Neutro Abs: 5.1 10*3/uL (ref 1.7–7.7)
Neutrophils Relative %: 58 %
Platelets: 287 10*3/uL (ref 150–400)
RBC: 3.57 MIL/uL — ABNORMAL LOW (ref 3.87–5.11)
RDW: 15.9 % — ABNORMAL HIGH (ref 11.5–15.5)
WBC: 8.9 10*3/uL (ref 4.0–10.5)
nRBC: 0 % (ref 0.0–0.2)

## 2020-05-15 LAB — GLUCOSE, CAPILLARY
Glucose-Capillary: 109 mg/dL — ABNORMAL HIGH (ref 70–99)
Glucose-Capillary: 175 mg/dL — ABNORMAL HIGH (ref 70–99)

## 2020-05-15 LAB — URINE CULTURE: Culture: >=100000 — AB

## 2020-05-15 LAB — PHOSPHORUS: Phosphorus: 2.9 mg/dL (ref 2.5–4.6)

## 2020-05-15 LAB — MAGNESIUM: Magnesium: 1.8 mg/dL (ref 1.7–2.4)

## 2020-05-15 MED ORDER — SULFAMETHOXAZOLE-TRIMETHOPRIM 800-160 MG PO TABS
1.0000 | ORAL_TABLET | Freq: Two times a day (BID) | ORAL | Status: DC
  Administered 2020-05-15: 1 via ORAL
  Filled 2020-05-15: qty 1

## 2020-05-15 MED ORDER — METFORMIN HCL 500 MG PO TABS: 500.0000 mg | ORAL_TABLET | Freq: Two times a day (BID) | ORAL | 11 refills | Status: DC

## 2020-05-15 MED ORDER — DABIGATRAN ETEXILATE MESYLATE 75 MG PO CAPS: 75.0000 mg | ORAL_CAPSULE | Freq: Two times a day (BID) | ORAL | Status: AC

## 2020-05-15 MED ORDER — METHENAMINE MANDELATE 0.5 G PO TABS: 500.0000 mg | ORAL_TABLET | Freq: Four times a day (QID) | ORAL | Status: DC

## 2020-05-15 MED ORDER — SULFAMETHOXAZOLE-TRIMETHOPRIM 800-160 MG PO TABS
1.0000 | ORAL_TABLET | Freq: Two times a day (BID) | ORAL | 0 refills | Status: AC
Start: 2020-05-15 — End: 2020-05-20

## 2020-05-15 NOTE — TOC Transition Note (Signed)
Transition of Care [facility]) - CM/SW Discharge Note   Patient Details  Name: [name] MRN: [id number] Date of Birth: 29-Jul-1929  Transition of Care [facility]) CM/SW Contact:  Kermit Balo, RN Phone Number: 05/15/2020, 1:55 PM   Clinical Narrative:    Pt unable to d/c to [facility] as they have no bed to offer the patient. CM has updated the patients daughter and she is in agreement with her returning to [facility]. CM has faxed into [employer] information needed for rehab approval.  [facility] has bed available and are aware pt is returning.  Son at the bedside will provide the transport back to the facility. Bedside RN updated and d/c packet at the desk.   Room: 212 Number for report: [phone]   Final next level of care: Skilled Nursing Facility Barriers to Discharge: No Barriers Identified   Patient Goals and CMS Choice   CMS Medicare.gov Compare Post Acute Care list provided to:: Patient Represenative (must comment) Choice offered to / list presented to : Patient,Adult Children  Discharge Placement              Patient chooses bed at: [facility] Patient to be transferred to facility by: son Name of family member notified: [name] Patient and family notified of of transfer: 05/15/20  Discharge Plan and Services In-house Referral: Clinical Social Work Discharge Planning Services: [employer] Consult Post Acute Care Choice: Skilled Nursing Facility                               Social Determinants of Health (SDOH) Interventions     Readmission Risk Interventions No flowsheet data found.

## 2020-05-15 NOTE — Plan of Care (Signed)
Problem: Education: Goal: Knowledge of disease or condition will improve 05/15/2020 1526 by Becky Augusta, RN Outcome: Adequate for Discharge 05/15/2020 1525 by Becky Augusta, RN Outcome: Adequate for Discharge 05/15/2020 1522 by Becky Augusta, RN Outcome: Adequate for Discharge Goal: Knowledge of secondary prevention will improve 05/15/2020 1526 by Becky Augusta, RN Outcome: Adequate for Discharge 05/15/2020 1525 by Becky Augusta, RN Outcome: Adequate for Discharge 05/15/2020 1522 by Becky Augusta, RN Outcome: Adequate for Discharge Goal: Knowledge of patient specific risk factors addressed and post discharge goals established will improve 05/15/2020 1526 by Becky Augusta, RN Outcome: Adequate for Discharge 05/15/2020 1525 by Becky Augusta, RN Outcome: Adequate for Discharge 05/15/2020 1522 by Becky Augusta, RN Outcome: Adequate for Discharge Goal: Individualized Educational Video(s) 05/15/2020 1526 by Becky Augusta, RN Outcome: Adequate for Discharge 05/15/2020 1525 by Becky Augusta, RN Outcome: Adequate for Discharge 05/15/2020 1522 by Becky Augusta, RN Outcome: Adequate for Discharge   Problem: Coping: Goal: Will verbalize positive feelings about self 05/15/2020 1526 by Becky Augusta, RN Outcome: Adequate for Discharge 05/15/2020 1525 by Becky Augusta, RN Outcome: Adequate for Discharge 05/15/2020 1522 by Becky Augusta, RN Outcome: Adequate for Discharge Goal: Will identify appropriate support needs 05/15/2020 1526 by Becky Augusta, RN Outcome: Adequate for Discharge 05/15/2020 1525 by Becky Augusta, RN Outcome: Adequate for Discharge 05/15/2020 1522 by Becky Augusta, RN Outcome: Adequate for Discharge   Problem: Health Behavior/Discharge Planning: Goal: Ability to manage health-related needs will improve 05/15/2020 1526 by Becky Augusta, RN Outcome: Adequate for Discharge 05/15/2020 1525 by Becky Augusta, RN Outcome: Adequate for Discharge 05/15/2020 1522 by Becky Augusta, RN Outcome: Adequate for  Discharge   Problem: Self-Care: Goal: Ability to participate in self-care as condition permits will improve 05/15/2020 1526 by Becky Augusta, RN Outcome: Adequate for Discharge 05/15/2020 1525 by Becky Augusta, RN Outcome: Adequate for Discharge 05/15/2020 1522 by Becky Augusta, RN Outcome: Adequate for Discharge Goal: Verbalization of feelings and concerns over difficulty with self-care will improve 05/15/2020 1526 by Becky Augusta, RN Outcome: Adequate for Discharge 05/15/2020 1525 by Becky Augusta, RN Outcome: Adequate for Discharge 05/15/2020 1522 by Becky Augusta, RN Outcome: Adequate for Discharge Goal: Ability to communicate needs accurately will improve 05/15/2020 1526 by Becky Augusta, RN Outcome: Adequate for Discharge 05/15/2020 1525 by Becky Augusta, RN Outcome: Adequate for Discharge 05/15/2020 1522 by Becky Augusta, RN Outcome: Adequate for Discharge   Problem: Nutrition: Goal: Risk of aspiration will decrease 05/15/2020 1526 by Becky Augusta, RN Outcome: Adequate for Discharge 05/15/2020 1525 by Becky Augusta, RN Outcome: Adequate for Discharge 05/15/2020 1522 by Becky Augusta, RN Outcome: Adequate for Discharge Goal: Dietary intake will improve 05/15/2020 1526 by Becky Augusta, RN Outcome: Adequate for Discharge 05/15/2020 1525 by Becky Augusta, RN Outcome: Adequate for Discharge 05/15/2020 1522 by Becky Augusta, RN Outcome: Adequate for Discharge   Problem: Intracerebral Hemorrhage Tissue Perfusion: Goal: Complications of Intracerebral Hemorrhage will be minimized 05/15/2020 1526 by Becky Augusta, RN Outcome: Adequate for Discharge 05/15/2020 1525 by Becky Augusta, RN Outcome: Adequate for Discharge 05/15/2020 1522 by Becky Augusta, RN Outcome: Adequate for Discharge   Problem: Ischemic Stroke/TIA Tissue Perfusion: Goal: Complications of ischemic stroke/TIA will be minimized 05/15/2020 1526 by Becky Augusta, RN Outcome: Adequate for Discharge 05/15/2020 1525 by Becky Augusta,  RN Outcome: Adequate for Discharge 05/15/2020 1522 by Becky Augusta, RN Outcome: Adequate for Discharge   Problem: Spontaneous Subarachnoid Hemorrhage Tissue Perfusion: Goal: Complications of Spontaneous Subarachnoid Hemorrhage will be minimized 05/15/2020 1526 by Becky Augusta, RN Outcome: Adequate for Discharge 05/15/2020 1525 by Dorna Bloom,  Peng-Sian, RN Outcome: Adequate for Discharge 05/15/2020 1522 by Becky Augusta, RN Outcome: Adequate for Discharge

## 2020-05-15 NOTE — Plan of Care (Signed)

## 2020-05-15 NOTE — Plan of Care (Signed)
  Problem: Education: Goal: Knowledge of disease or condition will improve 05/15/2020 1525 by Becky Augusta, RN Outcome: Adequate for Discharge 05/15/2020 1522 by Becky Augusta, RN Outcome: Adequate for Discharge Goal: Knowledge of secondary prevention will improve 05/15/2020 1525 by Becky Augusta, RN Outcome: Adequate for Discharge 05/15/2020 1522 by Becky Augusta, RN Outcome: Adequate for Discharge Goal: Knowledge of patient specific risk factors addressed and post discharge goals established will improve 05/15/2020 1525 by Becky Augusta, RN Outcome: Adequate for Discharge 05/15/2020 1522 by Becky Augusta, RN Outcome: Adequate for Discharge Goal: Individualized Educational Video(s) 05/15/2020 1525 by Becky Augusta, RN Outcome: Adequate for Discharge 05/15/2020 1522 by Becky Augusta, RN Outcome: Adequate for Discharge   Problem: Coping: Goal: Will verbalize positive feelings about self 05/15/2020 1525 by Becky Augusta, RN Outcome: Adequate for Discharge 05/15/2020 1522 by Becky Augusta, RN Outcome: Adequate for Discharge Goal: Will identify appropriate support needs 05/15/2020 1525 by Becky Augusta, RN Outcome: Adequate for Discharge 05/15/2020 1522 by Becky Augusta, RN Outcome: Adequate for Discharge   Problem: Health Behavior/Discharge Planning: Goal: Ability to manage health-related needs will improve 05/15/2020 1525 by Becky Augusta, RN Outcome: Adequate for Discharge 05/15/2020 1522 by Becky Augusta, RN Outcome: Adequate for Discharge   Problem: Self-Care: Goal: Ability to participate in self-care as condition permits will improve 05/15/2020 1525 by Becky Augusta, RN Outcome: Adequate for Discharge 05/15/2020 1522 by Becky Augusta, RN Outcome: Adequate for Discharge Goal: Verbalization of feelings and concerns over difficulty with self-care will improve 05/15/2020 1525 by Becky Augusta, RN Outcome: Adequate for Discharge 05/15/2020 1522 by Becky Augusta, RN Outcome: Adequate for Discharge Goal:  Ability to communicate needs accurately will improve 05/15/2020 1525 by Becky Augusta, RN Outcome: Adequate for Discharge 05/15/2020 1522 by Becky Augusta, RN Outcome: Adequate for Discharge   Problem: Nutrition: Goal: Risk of aspiration will decrease 05/15/2020 1525 by Becky Augusta, RN Outcome: Adequate for Discharge 05/15/2020 1522 by Becky Augusta, RN Outcome: Adequate for Discharge Goal: Dietary intake will improve 05/15/2020 1525 by Becky Augusta, RN Outcome: Adequate for Discharge 05/15/2020 1522 by Becky Augusta, RN Outcome: Adequate for Discharge   Problem: Intracerebral Hemorrhage Tissue Perfusion: Goal: Complications of Intracerebral Hemorrhage will be minimized 05/15/2020 1525 by Becky Augusta, RN Outcome: Adequate for Discharge 05/15/2020 1522 by Becky Augusta, RN Outcome: Adequate for Discharge   Problem: Ischemic Stroke/TIA Tissue Perfusion: Goal: Complications of ischemic stroke/TIA will be minimized 05/15/2020 1525 by Becky Augusta, RN Outcome: Adequate for Discharge 05/15/2020 1522 by Becky Augusta, RN Outcome: Adequate for Discharge   Problem: Spontaneous Subarachnoid Hemorrhage Tissue Perfusion: Goal: Complications of Spontaneous Subarachnoid Hemorrhage will be minimized 05/15/2020 1525 by Becky Augusta, RN Outcome: Adequate for Discharge 05/15/2020 1522 by Becky Augusta, RN Outcome: Adequate for Discharge

## 2020-05-15 NOTE — Plan of Care (Signed)
  Problem: Education: Goal: Knowledge of disease or condition will improve Outcome: Adequate for Discharge Goal: Knowledge of secondary prevention will improve Outcome: Adequate for Discharge Goal: Knowledge of patient specific risk factors addressed and post discharge goals established will improve Outcome: Adequate for Discharge Goal: Individualized Educational Video(s) Outcome: Adequate for Discharge   Problem: Coping: Goal: Will verbalize positive feelings about self Outcome: Adequate for Discharge Goal: Will identify appropriate support needs Outcome: Adequate for Discharge   Problem: Health Behavior/Discharge Planning: Goal: Ability to manage health-related needs will improve Outcome: Adequate for Discharge   Problem: Self-Care: Goal: Ability to participate in self-care as condition permits will improve Outcome: Adequate for Discharge Goal: Verbalization of feelings and concerns over difficulty with self-care will improve Outcome: Adequate for Discharge Goal: Ability to communicate needs accurately will improve Outcome: Adequate for Discharge   Problem: Nutrition: Goal: Risk of aspiration will decrease Outcome: Adequate for Discharge Goal: Dietary intake will improve Outcome: Adequate for Discharge   Problem: Intracerebral Hemorrhage Tissue Perfusion: Goal: Complications of Intracerebral Hemorrhage will be minimized Outcome: Adequate for Discharge   Problem: Ischemic Stroke/TIA Tissue Perfusion: Goal: Complications of ischemic stroke/TIA will be minimized Outcome: Adequate for Discharge   Problem: Spontaneous Subarachnoid Hemorrhage Tissue Perfusion: Goal: Complications of Spontaneous Subarachnoid Hemorrhage will be minimized Outcome: Adequate for Discharge   

## 2020-05-15 NOTE — Progress Notes (Signed)
Physical Therapy Treatment Patient Details Name: [name] MRN: [id number] DOB: 1929/10/12 Today's Date: 05/15/2020    History of Present Illness 87 y.o. SN[facility] LTC resident presenting with AMS. Patient found to have TIA and UTI. PMHx significant for vascular dementia, hypothyroidism, stage 4 CKD, HTN, HLD, DM, A-fib on Eliquis, RA, CAD, and CHF.    PT Comments    Pt tolerates treatment well with improved use of EVA walker for OOB mobility compared to RW. Pt is able to lean through elbows and forearms on walker rather than gripping rolling walker which she is unable to do 2/2 RA. Pt continues to require physical assistance for bed mobility and for all OOB mobility due to balance impairments. Pt will benefit from continued acute PT services to improve mobility quality and to reduce falls risk. PT continues to recommend SNF placement with rehab services.   Follow Up Recommendations  SNF     Equipment Recommendations  None recommended by PT    Recommendations for Other Services       Precautions / Restrictions Precautions Precautions: Fall Precaution Comments: High fall risk, incontinent x2, wears brief at baseline Restrictions Weight Bearing Restrictions: No    Mobility  Bed Mobility Overal bed mobility: Needs Assistance Bed Mobility: Supine to Sit     Supine to sit: Mod assist;HOB elevated     General bed mobility comments: pt requires assistance to bring trunk into sitting and to pivot hips toward edge of bed  Transfers Overall transfer level: Needs assistance Equipment used:  (EVA walker) Transfers: Sit to/from [employer] Sit to Stand: Min assist Stand pivot transfers: Mod assist       General transfer comment: pt requires modA for SPT to manage EVA walker when turning  Ambulation/Gait Ambulation/Gait assistance: Min assist Gait Distance (Feet): 10 Feet Assistive device:  (EVA walker) Gait Pattern/deviations: Step-to pattern;Trunk  flexed Gait velocity: reduced Gait velocity interpretation: <1.31 ft/sec, indicative of household ambulator General Gait Details: pt with short step-to gait, increased trunk flexion, intermittent mild posterior lean   Stairs             Wheelchair Mobility    Modified Rankin (Stroke Patients Only) Modified Rankin (Stroke Patients Only) Pre-Morbid Rankin Score: Moderately severe disability Modified Rankin: Moderately severe disability     Balance Overall balance assessment: Needs assistance Sitting-balance support: No upper extremity supported;Feet supported Sitting balance-Leahy Scale: Fair     Standing balance support: Bilateral upper extremity supported Standing balance-Leahy Scale: Poor Standing balance comment: reliant on UE support of EVA walker and minA                            Cognition Arousal/Alertness: Awake/alert Behavior During Therapy: WFL for tasks assessed/performed Overall Cognitive Status: History of cognitive impairments - at baseline                                 General Comments: pt with baseline dementia, does not recall session yesterday. Better oriented to situation and place at this time      Exercises      General Comments General comments (skin integrity, edema, etc.): son present, VSS      Pertinent Vitals/Pain Pain Assessment: Faces Faces Pain Scale: Hurts even more Pain Location: R shoulder Pain Descriptors / Indicators: Grimacing Pain Intervention(s): Monitored during session    Home Living  Prior Function            PT Goals (current goals can now be found in the care plan section) Acute Rehab PT Goals Patient Stated Goal: Per daughter, return to SNF Progress towards PT goals: Progressing toward goals    Frequency    Min 3X/week      PT Plan Current plan remains appropriate    Co-evaluation              AM-PAC PT "6 Clicks" Mobility   Outcome  Measure  Help needed turning from your back to your side while in a flat bed without using bedrails?: A Lot Help needed moving from lying on your back to sitting on the side of a flat bed without using bedrails?: A Lot Help needed moving to and from a bed to a chair (including a wheelchair)?: A Little Help needed standing up from a chair using your arms (e.g., wheelchair or bedside chair)?: A Little Help needed to walk in hospital room?: A Little Help needed climbing 3-5 steps with a railing? : Total 6 Click Score: 14    End of Session   Activity Tolerance: Patient tolerated treatment well Patient left: in chair;with call bell/phone within reach;with chair alarm set;with family/visitor present Nurse Communication: Mobility status PT Visit Diagnosis: Unsteadiness on feet (R26.81);Other abnormalities of gait and mobility (R26.89);Muscle weakness (generalized) (M62.81)     Time: 9381-8299 PT Time Calculation (min) (ACUTE ONLY): 17 min  Charges:  $Gait Training: 8-22 mins                     Arlyss Gandy, PT, DPT Acute Rehabilitation Pager: [phone]    Arlyss Gandy 05/15/2020, 10:10 AM

## 2020-05-15 NOTE — Progress Notes (Signed)
STROKE TEAM PROGRESS NOTE   INTERVAL HISTORY Patient evaluated at the bedside.  No family present at the bedside.  Vital stable.  Afebrile.    ECHO shows LVEF of 55 to 60% severely dilated left atrium and mildly dilated right atrium, and calcified and thicker AV. EEG negative for any seizure or epileptiform discharges. On examination today. Patient is at her baseline. She is alert, awake, oriented to self, situation, knows month but not year. No focal deficits present.  Report improvement. Denies any complaints.  Patient was on Eliquis and Aspirin at home. Aspirin was stopped. CSW found that Paradexa is covered by her insurance. Eliquis was switched to Pradaxa yesterday. Pt and OT recommended SNF.  Vitals:   05/15/20 0349 05/15/20 0352 05/15/20 0812 05/15/20 1125  BP: (!) 165/83  (!) 165/99 (!) 150/76  Pulse: (!) 124 90 (!) 45 70  Resp: 19  16 18   Temp: 97.7 F (36.5 C)  (!) 97.4 F (36.3 C) 98.1 F (36.7 C)  TempSrc: Oral  Oral Oral  SpO2: 96% 96% 90% 97%  Weight:      Height:       CBC:  Recent Labs  Lab 05/13/20 1229 05/15/20 0436  WBC 8.8 8.9  NEUTROABS 5.1 5.1  HGB 11.4* 10.9*  HCT 36.7 34.9*  MCV 98.4 97.8  PLT 328 287   Basic Metabolic Panel:  Recent Labs  Lab 05/13/20 1229 05/15/20 0436  NA 137 140  K 4.1 4.0  CL 99 106  CO2 27 26  GLUCOSE 227* 104*  BUN 44* 36*  CREATININE 1.59* 1.16*  CALCIUM 9.3 8.6*  MG  --  1.8  PHOS  --  2.9   Lipid Panel:  Recent Labs  Lab 05/14/20 0337  CHOL 99  TRIG 74  HDL 27*  CHOLHDL 3.7  VLDL 15  LDLCALC 57   HgbA1c:  Recent Labs  Lab 05/14/20 0337  HGBA1C 7.6*   Urine Drug Screen: No results for input(s): LABOPIA, COCAINSCRNUR, LABBENZ, AMPHETMU, THCU, LABBARB in the last 168 hours.  Alcohol Level No results for input(s): ETH in the last 168 hours.  IMAGING past 24 hours EEG adult  Result Date: 05/14/2020 07/12/2020, MD     05/14/2020  5:20 PM Patient Name: [name] MRN: [facility]  Attending: [id number] Referring Physician/Provider: Charlsie Quest, NP Date: 05/14/2020 Duration: 28.08 mins Patient history:  87yo F presented with transient altered mental status and facial droop. EEG to evaluate for seizure Level of alertness: Awake AEDs during EEG study: None Technical aspects: This EEG study was done with scalp electrodes positioned according to the 10-20 International system of electrode placement. Electrical activity was acquired at a sampling rate of 500Hz  and reviewed with a high frequency filter of 70Hz  and a low frequency filter of 1Hz . EEG data were recorded continuously and digitally stored. Description: The posterior dominant rhythm consists of 8Hz  activity of moderate voltage (25-35 uV) seen predominantly in posterior head regions, symmetric and reactive to eye opening and eye closing. Hyperventilation and photic stimulation were not performed.   IMPRESSION: This study is within normal limits. No seizures or epileptiform discharges were seen throughout the recording. [name] 87yo    PHYSICAL EXAM GENERAL: Pleasant elderly Caucasian lady not in distress. HEENT: - Normocephalic and atraumatic. LUNGS - Symmetrical chest rise, No labored breathing noted CV - no JVD, No Peripheral Edema ABDOMEN - Soft,  nondistended  Ext: warm, well perfused, intact peripheral pulses, no Peripheral edema.  NEURO Exam:   Mental Status: AA& Oriented to self, age, place, situation . Not oriented to time. Knows month but doesn't know date or year.  Diminished attention and Concentration. Language: speech is mildly dysarthric (at baseline per daughter).  fluency, and comprehension intact.  No aphasia. Cranial Nerves:   CN II Pupils equal and reactive to light, no VF deficits.   CN III,IV,VI EOM intact, Limited upwards gaze (normal due to aging), no nystagmus.   CN V normal sensation in V1, V2, and V3 segments bilaterally.   CN VII no asymmetry, no nasolabial fold flattening.   CN  VIII normal hearing to speech.   CN IX & X normal palatal elevation, no uvular deviation.   CN XI 5/5 head turn and 5/5 shoulder shrug bilaterally.   CN XII midline tongue protrusion   Motor: No Drift in Upper Extremities, No Drift in LE's. Strength 4/5 in Rt UE, 4/5 in Lt UE, limited movements at shoulders and hands due to Rheumatoid arthritis. Strength in Rt LE  4/5, Lt LE 4/5 Tone: is normal and bulk is normal.  Sensation- Intact to light touch bilaterally. Coordination: FTN intact bilaterally, no ataxia. Gait- deferred.  ASSESSMENT/PLAN Ms. [name] is a 87 y.o. female with history significant for vascular dementia, hypothyroidism, stage IV chronic kidney disease, hypertension, hyperlipidemia, atrial fibrillation on Eliquis, coronary artery disease, and chronic diastolic heart failure who presented to [facility] on 05/13/20 by EMS for evaluation after an episode this morning of unresponsiveness with eyes open and right eye fixation and right-sided facial droop.  Patient initially admitted with episode of unresponsiveness with eyes open and right eye fixation and right-sided facial droop.  Her symptoms have resolved on arrival to the hospital. CT negative for acute intracranial abnormality.  MRI brain shows acute infarct in cortex and subcortical white matter of the posterior left frontal lobe chronic lacunar infarcts within the bilateral coronary radiata/basal ganglia and right thalamus and Scattered supratentorial> infratentorial chronic microhemorrhages.  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  Also shows multiple aneurysms.  ECHO shows LVEF of 55 to 60% severely dilated left atrium and mildly dilated right atrium, and calcified and thicker AV. EEG normal.  Pt and OT recommended SNF. Aspirin was stopped. Eliquis switched to Pradaxa because of ineffectivity. No more neurological interventions or testing needed. Neurology will sign off.     Stroke (acute infarct in cortex and subcortical white matter of the posterior left frontal lobe of embolic etiology from atrial fibrillation despite being on anticoagulation ).  Code Stroke CT Head- Mild chronic ischemic white matter disease. No acute intracranial abnormality seen.  CTA head & neck Not ordered.  CTA head- not ordered.  CTA neck -Not ordered.  CT perfusion- Not ordered.  MRI -  Four subcentimeter acute infarcts within the cortex and subcortical white matter of the posterior left frontal lobe (MCA territory and MCA/ACA watershed territory). Chronic lacunar infarcts within the bilateral corona radiata/basal ganglia and right thalamus. Scattered supratentorial greater than infratentorial chronic microhemorrhages, which may reflect sequela of hypertensive microangiopathy and/or cerebral amyloid angiopathy.  MRA - No intracranial large vessel occlusion. 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 Developmentally diminutive vertebral and proximal basilar arteries. Multiple intracranial aneurysms.   Carotid Doppler - left ICA are consistent with a 40-59% stenosis. right ICA are consistent with a 1-39%  stenosis.   2D Echo - LVEF 55-60%. Left atrium severely dilated.  Right atrium mildly dilated.  Moderate MV  regurgitation.  Thicker and calcified AV with restricted motion.  LDL 57  HgbA1c 7.6  VTE prophylaxis -None. on Pradaxa for A fib.     Diet   Diet heart healthy/carb modified Room service appropriate? Yes; Fluid consistency: Thin    Eliquis (apixaban) daily prior to admission, now on Pradaxa (dabigatran) twice a day.  Therapy recommendations:  SNF  Disposition: Can be D/C from stroke service.   Hypertension  Home meds:  Toprol XL 25 mg Daily.   Stable . Long-term BP goal normotensive  Hyperlipidemia  Home meds:  None, Lipitor 40 in the hospital.   LDL 57, goal < 70  Continue statin at discharge  Diabetes type II Controlled  Home meds:   None  HgbA1c 7.6, goal < 7.0  CBGs Recent Labs    05/14/20 2108 05/15/20 0604 05/15/20 1124  GLUCAP 296* 109* 175*      SSI with HS coverage in the Hospital.   Atrial Fibrillation- Was on Eliquis - Now changed to Pradaxa.   Congestive heart failure- Vitals stable. Home meds include Diltezem 180 mg Daily.  Other Stroke Risk Factors  Advanced Age > 48.   Coronary artery disease. Stable.   Other Active Problems  RA  Stage 4 CKD- Creatinine 1.16, GFR low at 45.   UTI - >100,000 Colonies of Proteus Mirabilis and E Coli- On Bactrim, Managed by primary.  Hospital day # 0 I have personally obtained history,examined this patient, reviewed notes, independently viewed imaging studies, participated in medical decision making and plan of care.ROS completed by me personally and pertinent positives fully documented  I have made any additions or clarifications directly to the above note. Agree with note above.  Agree with changing Eliquis to Pradaxa 150 mg twice daily for stroke prevention and continue aggressive risk factor modification.  Treatment for UTI as per primary team.  Discussed with Dr. Lyndel Safe.  Greater than 50% time during this visit was spent on counseling and coordination of care about her stroke and answering questions.  Stroke team will sign off.  Kindly call for questions Delia Heady, MD Delia Heady, MD Medical Director [facility] Pager: [phone] 05/15/2020 2:22 PM    To contact Stroke Continuity provider, please refer to [email or web address]. After hours, contact General Neurology

## 2020-05-15 NOTE — Discharge Summary (Signed)
Physician Discharge Summary  [name] WUJ:[id number] DOB: 1929-10-30 DOA: 05/13/2020  PCP: [name], MD  Admit date: 05/13/2020 Discharge date: 05/15/2020  Admitted From: Skilled nursing facility Disposition: Skilled nursing facility  Recommendations for Outpatient Follow-up:  1. Follow up with PCP in 1-2 weeks 2. Please obtain BMP/CBC in one week 3. Please follow up on the following pending results: 4. Urine culture and sensitivity  Home Health: Not applicable Equipment/Devices: Not applicable  Discharge Condition: Stable CODE STATUS: DNR Diet recommendation: Low-salt, low-carb diet  Discharge summary: 87 year old female with history of vascular dementia, hypothyroidism, stage IIIb chronic kidney disease, hypertension, hyperlipidemia, type 2 diabetes on diet , chronic A. fib on Eliquis, rheumatoid arthritis on methotrexate presented from long-term nursing home with altered mental status, right-sided weakness. She is a long-term care resident at [facility].  She was found unresponsive and her right eye was fixated.  She also suffers from recurrent UTI.  Waxing and waning mental status at baseline. By the time she arrived to emergency room most of her symptoms have improved.  Admitted to the hospital with acute stroke.  Diagnosis, plan of care.  Acute left MCA territory ischemic stroke: Clinical findings, transient unresponsiveness, right hemiparesis that is quickly resolved. Initial CT head negative. MRI of the brain, left MCA territory ischemic stroke, ACA territory stroke Carotid Doppler, 40-59 percent stenosis bilateral 2D echocardiogram, normal ejection fraction.  No source of embolism. Antiplatelet therapy, on Eliquis at home. Changed to Pradaxa. LDL 57.  At goal.  Appropriately on statin. Hemoglobin A1c 7.6.  Goal is less than 7.    Start Metformin.  Diet modifications. Therapy recommendations, skilled nursing facility.  Hypertension: Blood pressures fairly  stable.  No large vessel occlusion.  Will resume all her antihypertensive medications.  Type 2 diabetes: Well controlled.  A1c 7.6.  Apparently not on treatment, will prescribe Metformin on discharge.  Rheumatoid arthritis with deformities: On maintenance methotrexate.  Patient is on tramadol that she will continue.  Chronic A. fib: Rate controlled.  Therapeutic on Eliquis.  Changing to Pradaxa.  Hypothyroidism: Euthyroid.  Continue similar dose of thyroxine.  Recurrent UTI: She had dark and foul-smelling urine few days ago.  She is on chronic maintenance therapy with methenamine from her urologist.  Urine culture growing 2 types of gram-negative bacteria, more than 100,000 colonies. Allergy to cephalosporin. Full dose Bactrim for 5 days followed by maintenance methenamine 500 mg twice a day for chronic suppressive therapy as done by her urologist.  Acute kidney injury CKD stage 3B: Probably at baseline.    Improved with IV fluids.   Patient is medically stabilized.  She will go back to skilled nursing facility and continue to work with PT OT.  Discharge Diagnoses:  Principal Problem:   TIA (transient ischemic attack) Active Problems:   Hypothyroidism   ATRIAL FIBRILLATION   Chronic diastolic heart failure (HCC)   DNR (do not resuscitate)   Rheumatoid arthritis (HCC)   Diabetes mellitus with renal complications (HCC)   CKD (chronic kidney disease) stage 3, GFR 30-59 ml/min (HCC)   Hyperlipidemia   Hypertension   Vascular dementia, uncomplicated (HCC)    Discharge Instructions  Discharge Instructions    Diet - low sodium heart healthy   Complete by: As directed    Diet Carb Modified   Complete by: As directed    Increase activity slowly   Complete by: As directed      Allergies as of 05/15/2020      Reactions   Cefuroxime  Anaphylaxis, Other (See Comments)   Throat swelling - MAR   Fish Allergy Shortness Of Breath   Pt said she has throat swelling     Ciprofloxacin Rash   Per MAR   Codeine Other (See Comments)   Jittery - Per MAR   Sertraline Hcl Rash   Per MAR   Cephalexin Rash   Per MAR   Paxil [paroxetine Hcl] Other (See Comments)   Delirium while in hospital reports of hallucinations.      Medication List    STOP taking these medications   apixaban 5 MG Tabs tablet Commonly known as: Eliquis     TAKE these medications   Calcium Carb-Cholecalciferol 600-800 MG-UNIT Tabs TAKE ONE TABLET BY MOUTH 2 TIMES A DAY What changed: See the new instructions.   Cranberry 500 MG Caps Take 500 mg by mouth daily.   dabigatran 75 MG Caps capsule Commonly known as: PRADAXA Take 1 capsule (75 mg total) by mouth every 12 (twelve) hours.   Dermacloud Oint Apply 1 application topically 2 (two) times daily.   diltiazem 180 MG 24 hr capsule Commonly known as: CARDIZEM CD Take 1 capsule (180 mg total) by mouth at bedtime.   docusate sodium 100 MG capsule Commonly known as: COLACE Take 100 mg by mouth 2 (two) times daily.   eucerin cream Apply 1 application topically as needed for dry skin.   ferrous sulfate 325 (65 FE) MG tablet Take 325 mg by mouth 2 (two) times daily.   FLUoxetine 20 MG capsule Commonly known as: PROZAC Take 20 mg by mouth daily.   folic acid 1 MG tablet Commonly known as: FOLVITE Take 1 tablet (1 mg total) by mouth daily.   glucose blood test strip Once daily E11.29   levothyroxine 75 MCG tablet Commonly known as: SYNTHROID Take 75 mcg by mouth daily before breakfast.   loperamide 2 MG tablet Commonly known as: IMODIUM A-D Take 4 mg by mouth See admin instructions. Take 2 capsules (  totally) by mouth after first loose stool and  after subsequent stool; do not exceed  in 24hr   memantine 10 MG tablet Commonly known as: NAMENDA Take 10 mg by mouth at bedtime.   metFORMIN 500 MG tablet Commonly known as: Glucophage Take 1 tablet (500 mg total) by mouth 2 (two) times daily with a  meal.   methenamine 0.5 GM tablet Commonly known as: MANDELAMINE Take 1 tablet (500 mg total) by mouth 4 (four) times daily. Start taking on: May 21, 2020   methotrexate 10 MG tablet Commonly known as: RHEUMATREX Take 20 mg by mouth every Friday.   metoprolol succinate 25 MG 24 hr tablet Commonly known as: TOPROL-XL Take 25 mg by mouth daily.   mirtazapine 15 MG tablet Commonly known as: REMERON Take 7.5 mg by mouth at bedtime.   polyethylene glycol 17 g packet Commonly known as: MIRALAX / GLYCOLAX Take 17 g by mouth daily.   pravastatin 40 MG tablet Commonly known as: PRAVACHOL TAKE 1 TABLET (40 MG TOTAL) BY MOUTH DAILY. What changed: when to take this   Premarin vaginal cream Generic drug: conjugated estrogens PLACE 1 APPLICATORFUL VAGINALLY ONCE A DAY What changed: See the new instructions.   Probiotic 250 MG Caps Take 250 mg by mouth 2 (two) times daily.   sulfamethoxazole-trimethoprim 800-160 MG tablet Commonly known as: BACTRIM DS Take 1 tablet by mouth every 12 (twelve) hours for 5 days.   Systane Complete 0.6 % Soln Generic drug: Propylene Glycol Apply  1 drop to eye 4 (four) times daily.   torsemide 100 MG tablet Commonly known as: DEMADEX Take 0.5 tablets (50 mg total) by mouth 2 (two) times daily.   traMADol 50 MG tablet Commonly known as: ULTRAM Take 1 tablet (50 mg total) by mouth 2 (two) times daily for 5 days.   vitamin B-12 1000 MCG tablet Commonly known as: CYANOCOBALAMIN Take 1,000 mcg by mouth daily.   Vitamin D2 50 MCG (2000 UT) Tabs Take 2,000 Units by mouth daily.       Follow-up Information    [name], MD Follow up in 3 week(s).   Specialty: Internal Medicine Contact information: [address] [place] [address] [phone]              Allergies  Allergen Reactions  . Cefuroxime Anaphylaxis and Other (See Comments)    Throat swelling - MAR  . Fish Allergy Shortness Of Breath    Pt  said she has throat swelling   . Ciprofloxacin Rash    Per MAR  . Codeine Other (See Comments)    Jittery - Per MAR  . Sertraline Hcl Rash    Per MAR  . Cephalexin Rash    Per MAR  . Paxil [Paroxetine Hcl] Other (See Comments)    Delirium while in hospital reports of hallucinations.    Consultations:  Neurology   Procedures/Studies: DG Chest 2 View  Result Date: 05/13/2020 CLINICAL DATA:  Confusion. EXAM: CHEST - 2 VIEW COMPARISON:  05/19/2019 FINDINGS: Osteopenia.  Thoracic vertebral body height loss at multiple levels. Midline trachea. Moderate cardiomegaly. Tortuous thoracic aorta. Atherosclerosis in the transverse aorta. Prior median sternotomy. No pleural effusion or pneumothorax. Chronic mild interstitial thickening, without overt congestive failure. No lobar consolidation. IMPRESSION: No acute cardiopulmonary disease. Cardiomegaly without congestive failure. Aortic Atherosclerosis (ICD10-I70.0). Electronically Signed   By: Jeronimo Greaves M.D.   On: 05/13/2020 13:38   CT Head Wo Contrast  Result Date: 05/13/2020 CLINICAL DATA:  Altered mental status. EXAM: CT HEAD WITHOUT CONTRAST TECHNIQUE: Contiguous axial images were obtained from the base of the skull through the vertex without intravenous contrast. COMPARISON:  May 19, 2019. FINDINGS: Brain: Mild chronic ischemic white matter disease is noted. No mass effect or midline shift is noted. Ventricular size is within normal limits. There is no evidence of mass lesion, hemorrhage or acute infarction. Vascular: No hyperdense vessel or unexpected calcification. Skull: Normal. Negative for fracture or focal lesion. Sinuses/Orbits: No acute finding. Other: None. IMPRESSION: Mild chronic ischemic white matter disease. No acute intracranial abnormality seen. Electronically Signed   By: Lupita Raider M.D.   On: 05/13/2020 13:02   MR ANGIO HEAD WO CONTRAST  Result Date: 05/13/2020 CLINICAL DATA:  Transient ischemic attack (TIA). EXAM: MRI  HEAD WITHOUT CONTRAST MRA HEAD WITHOUT CONTRAST TECHNIQUE: Multiplanar, multiecho pulse sequences of the brain and surrounding structures were obtained without intravenous contrast. Angiographic images of the head were obtained using MRA technique without contrast. COMPARISON:  Prior head CT examinations 05/13/2020 and earlier. FINDINGS: MRI HEAD FINDINGS Brain: The examination is intermittently motion degraded. Most notably, there is moderate motion degradation of the sagittal T1 weighted sequence and moderate/severe motion degradation of the axial T2/FLAIR sequence. Mild-to-moderate cerebral and cerebellar atrophy. There are 4 subcentimeter acute infarcts within the cortex and subcortical white matter of the posterior left frontal lobe. Notably, two of these infarcts involve the left motor strip. These infarcts are located in the left MCA territory and left MCA/ACA  watershed territory. Chronic lacunar infarcts within the bilateral corona radiata/basal ganglia and right thalamus. Background moderately advanced multifocal T2/FLAIR hyperintensity within the cerebral white matter which is nonspecific, but compatible with chronic small vessel ischemic disease. To a lesser degree, chronic small vessel ischemic changes are also present within the pons. Scattered supratentorial and infratentorial chronic microhemorrhages. No evidence of intracranial mass. No extra-axial fluid collection. No midline shift. Vascular: Reported below. Skull and upper cervical spine: Within the limitations of motion degradation, no focal marrow lesion is identified. Sinuses/Orbits: Visualized orbits show no acute finding. Trace ethmoid sinus mucosal thickening. MRA HEAD FINDINGS The intracranial internal carotid arteries are patent. 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 The M1 middle cerebral arteries are patent. No M2 proximal branch occlusion or high-grade proximal stenosis is identified. The anterior cerebral arteries are  patent. 3 mm aneurysm arising from the proximal aspect of the persistent primitive trigeminal artery (for instance as seen on series 2, image 47) (series 252, image 5). Suspected 1-2 mm aneurysm arising from the cavernous right ICA (series 2, image 45). 2 mm laterally projecting aneurysm arising from the cavernous left ICA (series 2, image 46). 2 mm medially projecting vascular protrusion arising from the distal cavernous left ICA, likely aneurysm (series 2, image 52). 2-3 mm inferiorly projecting vascular protrusion arising from the supraclinoid left ICA, likely aneurysm (series 2, image 57). Developmentally diminutive vertebral and proximal basilar arteries. The right vertebral artery appears to terminate predominantly as the right PICA. The basilar artery is patent. The posterior cerebral arteries are patent without significant proximal stenosis. Posterior communicating arteries are hypoplastic or absent bilaterally. IMPRESSION: MRI brain: 1. Motion degraded examination as described. 2. Four subcentimeter acute infarcts within the cortex and subcortical white matter of the posterior left frontal lobe (MCA territory and MCA/ACA watershed territory). Notably, two of these infarcts involve the left motor strip. 3. Chronic lacunar infarcts within the bilateral corona radiata/basal ganglia and right thalamus. 4. Background moderately advanced chronic small vessel ischemic disease. 5. Scattered supratentorial greater than infratentorial chronic microhemorrhages, which may reflect sequela of hypertensive microangiopathy and/or cerebral amyloid angiopathy. 6. Mild-to-moderate atrophy of the brain. MRA head: 1. No intracranial large vessel occlusion or high-grade proximal arterial stenosis. 2. 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 Developmentally diminutive vertebral and proximal basilar arteries. 3. Multiple intracranial aneurysms as follows. 3 mm aneurysm arising from the proximal aspect of the persistent  primitive trigeminal artery. Suspected 1-2 mm aneurysm arising from the cavernous right ICA. 2 mm aneurysm arising from the cavernous left ICA. 2 mm aneurysm arising from the cavernous left ICA more distally. 2-3 mm aneurysm arising from the supraclinoid left ICA. Nonemergent neurointerventional consultation should be considered. Electronically Signed   By: Jackey Loge DO   On: 05/13/2020 17:42   MR BRAIN WO CONTRAST  Result Date: 05/13/2020 CLINICAL DATA:  Transient ischemic attack (TIA). EXAM: MRI HEAD WITHOUT CONTRAST MRA HEAD WITHOUT CONTRAST TECHNIQUE: Multiplanar, multiecho pulse sequences of the brain and surrounding structures were obtained without intravenous contrast. Angiographic images of the head were obtained using MRA technique without contrast. COMPARISON:  Prior head CT examinations 05/13/2020 and earlier. FINDINGS: MRI HEAD FINDINGS Brain: The examination is intermittently motion degraded. Most notably, there is moderate motion degradation of the sagittal T1 weighted sequence and moderate/severe motion degradation of the axial T2/FLAIR sequence. Mild-to-moderate cerebral and cerebellar atrophy. There are 4 subcentimeter acute infarcts within the cortex and subcortical white matter of the posterior left frontal lobe. Notably, two of these  infarcts involve the left motor strip. These infarcts are located in the left MCA territory and left MCA/ACA watershed territory. Chronic lacunar infarcts within the bilateral corona radiata/basal ganglia and right thalamus. Background moderately advanced multifocal T2/FLAIR hyperintensity within the cerebral white matter which is nonspecific, but compatible with chronic small vessel ischemic disease. To a lesser degree, chronic small vessel ischemic changes are also present within the pons. Scattered supratentorial and infratentorial chronic microhemorrhages. No evidence of intracranial mass. No extra-axial fluid collection. No midline shift. Vascular:  Reported below. Skull and upper cervical spine: Within the limitations of motion degradation, no focal marrow lesion is identified. Sinuses/Orbits: Visualized orbits show no acute finding. Trace ethmoid sinus mucosal thickening. MRA HEAD FINDINGS The intracranial internal carotid arteries are patent. 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 The M1 middle cerebral arteries are patent. No M2 proximal branch occlusion or high-grade proximal stenosis is identified. The anterior cerebral arteries are patent. 3 mm aneurysm arising from the proximal aspect of the persistent primitive trigeminal artery (for instance as seen on series 2, image 47) (series 252, image 5). Suspected 1-2 mm aneurysm arising from the cavernous right ICA (series 2, image 45). 2 mm laterally projecting aneurysm arising from the cavernous left ICA (series 2, image 46). 2 mm medially projecting vascular protrusion arising from the distal cavernous left ICA, likely aneurysm (series 2, image 52). 2-3 mm inferiorly projecting vascular protrusion arising from the supraclinoid left ICA, likely aneurysm (series 2, image 57). Developmentally diminutive vertebral and proximal basilar arteries. The right vertebral artery appears to terminate predominantly as the right PICA. The basilar artery is patent. The posterior cerebral arteries are patent without significant proximal stenosis. Posterior communicating arteries are hypoplastic or absent bilaterally. IMPRESSION: MRI brain: 1. Motion degraded examination as described. 2. Four subcentimeter acute infarcts within the cortex and subcortical white matter of the posterior left frontal lobe (MCA territory and MCA/ACA watershed territory). Notably, two of these infarcts involve the left motor strip. 3. Chronic lacunar infarcts within the bilateral corona radiata/basal ganglia and right thalamus. 4. Background moderately advanced chronic small vessel ischemic disease. 5. Scattered supratentorial  greater than infratentorial chronic microhemorrhages, which may reflect sequela of hypertensive microangiopathy and/or cerebral amyloid angiopathy. 6. Mild-to-moderate atrophy of the brain. MRA head: 1. No intracranial large vessel occlusion or high-grade proximal arterial stenosis. 2. 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 Developmentally diminutive vertebral and proximal basilar arteries. 3. Multiple intracranial aneurysms as follows. 3 mm aneurysm arising from the proximal aspect of the persistent primitive trigeminal artery. Suspected 1-2 mm aneurysm arising from the cavernous right ICA. 2 mm aneurysm arising from the cavernous left ICA. 2 mm aneurysm arising from the cavernous left ICA more distally. 2-3 mm aneurysm arising from the supraclinoid left ICA. Nonemergent neurointerventional consultation should be considered. Electronically Signed   By: Jackey Loge DO   On: 05/13/2020 17:42   EEG adult  Result Date: 05/14/2020 Charlsie Quest, MD     05/14/2020  5:20 PM Patient Name: ALIZAH SILLS MRN: [id number] Epilepsy Attending: Charlsie Quest Referring Physician/Provider: Lanae Boast, NP Date: 05/14/2020 Duration: 28.08 mins Patient history:  87yo F presented with transient altered mental status and facial droop. EEG to evaluate for seizure Level of alertness: Awake AEDs during EEG study: None Technical aspects: This EEG study was done with scalp electrodes positioned according to the 10-20 International system of electrode placement. Electrical activity was acquired at a sampling rate of  and reviewed with a high frequency  filter of 70Hz  and a low frequency filter of 1Hz . EEG data were recorded continuously and digitally stored. Description: The posterior dominant rhythm consists of 8Hz  activity of moderate voltage (25-35 uV) seen predominantly in posterior head regions, symmetric and reactive to eye opening and eye closing. Hyperventilation and photic stimulation were not performed.    IMPRESSION: This study is within normal limits. No seizures or epileptiform discharges were seen throughout the recording.   ECHOCARDIOGRAM COMPLETE  Result Date: 05/14/2020    ECHOCARDIOGRAM REPORT   Patient Name:   NOELIA LENART Date of Exam: 05/14/2020 Medical Rec #:  07/12/2020       Height:       62.0 in Accession #:    Hassel Neth      Weight:       147.0 lb Date of Birth:  [birth year]      BSA:          1.677 m Patient Age:    89 years        BP:           148/77 mmHg Patient Gender: F               HR:           92 bpm. Exam Location:  Inpatient Procedure: 2D Echo Indications:    TIA 435.9 / G45.9  History:        Patient has prior history of Echocardiogram examinations, most                 recent 11/18/2016. CHF, CAD, Carotid Disease and PAD; Risk                 Factors:Hypertension and Dyslipidemia. GERD. Thyroid disease.  Sonographer:    [id number] RDCS Referring Phys: [id number] JENNIFER YATES IMPRESSIONS  1. Left ventricular ejection fraction, by estimation, is 55 to 60%. The left ventricle has normal function. The left ventricle has no regional wall motion abnormalities. There is moderate to severe left ventricular hypertrophy. Left ventricular diastolic parameters are indeterminate.  2. Right ventricular systolic function is normal. The right ventricular size is normal. There is mildly elevated pulmonary artery systolic pressure.  3. Left atrial size was severely dilated.  4. Right atrial size was mildly dilated.  5. The mitral valve is abnormal. Moderate mitral valve regurgitation. Moderate to severe mitral annular calcification.  6. Tricuspid valve regurgitation is mild to moderate.  7. AV is thickened, calcified with restricted motion Peak and mean gradients through the valve are 57 and 38 mm Hg respectively THis is consistent with moderate/severe to severe AS Dimensionless index is 0.15 consistent with critical AS COmpared to echo  images from 2018, AV is thicker, more calcified  and the gradients are increased. . Aortic valve regurgitation is mild. FINDINGS  Left Ventricle: Left ventricular ejection fraction, by estimation, is 55 to 60%. The left ventricle has normal function. The left ventricle has no regional wall motion abnormalities. The left ventricular internal cavity size was normal in size. There is  moderate to severe left ventricular hypertrophy. Left ventricular diastolic parameters are indeterminate. Right Ventricle: The right ventricular size is normal. Right vetricular wall thickness was not assessed. Right ventricular systolic function is normal. There is mildly elevated pulmonary artery systolic pressure. The tricuspid regurgitant velocity is 3.07 m/s, and with an assumed right atrial pressure of 3 mmHg, the estimated right ventricular systolic pressure is 40.7 mmHg. Left Atrium: Left atrial size was severely dilated.  Right Atrium: Right atrial size was mildly dilated. Pericardium: There is no evidence of pericardial effusion. Mitral Valve: The mitral valve is abnormal. There is mild thickening of the mitral valve leaflet(s). Moderate to severe mitral annular calcification. Moderate mitral valve regurgitation. Tricuspid Valve: The tricuspid valve is normal in structure. Tricuspid valve regurgitation is mild to moderate. Aortic Valve: AV is thickened, calcified with restricted motion Peak and mean gradients through the valve are 57 and 38 mm Hg respectively THis is consistent with moderate/severe to severe AS Dimensionless index is 0.15 consistent with critical AS COmpareed to echo images from 2018, AV is thicker, more calcified and the gradients are increased. The aortic valve is abnormal. Aortic valve regurgitation is mild. Aortic regurgitation PHT measures 228 msec. Aortic valve mean gradient measures 33.8 mmHg. Aortic valve peak gradient measures 53.9 mmHg. Aortic valve area, by VTI measures 0.64 cm. Pulmonic Valve: The pulmonic valve was not well visualized. Pulmonic  valve regurgitation is not visualized. Aorta: The aortic root is normal in size and structure. IAS/Shunts: No atrial level shunt detected by color flow Doppler.  LEFT VENTRICLE PLAX 2D LVIDd:         3.80 cm LVIDs:         3.50 cm LV PW:         1.65 cm LV IVS:        1.30 cm LVOT diam:     2.30 cm LV SV:         45 LV SV Index:   27 LVOT Area:     4.15 cm  LEFT ATRIUM              Index       RIGHT ATRIUM           Index LA diam:        3.90 cm  2.32 cm/m  RA Area:     21.90 cm LA Vol (A2C):   144.0 ml 85.84 ml/m RA Volume:   65.90 ml  39.29 ml/m LA Vol (A4C):   112.0 ml 66.77 ml/m LA Biplane Vol: 128.0 ml 76.31 ml/m  AORTIC VALVE AV Area (Vmax):    0.79 cm AV Area (Vmean):   0.68 cm AV Area (VTI):     0.64 cm AV Vmax:           367.25 cm/s AV Vmean:          273.250 cm/s AV VTI:            0.705 m AV Peak Grad:      53.9 mmHg AV Mean Grad:      33.8 mmHg LVOT Vmax:         69.78 cm/s LVOT Vmean:        44.700 cm/s LVOT VTI:          0.109 m LVOT/AV VTI ratio: 0.15 AI PHT:            228 msec MITRAL VALVE               TRICUSPID VALVE MV Area (PHT): 5.86 cm    TR Peak grad:   37.7 mmHg MV Decel Time: 130 msec    TR Vmax:        307.00 cm/s MR Peak grad: 128.1 mmHg MR Mean grad: 86.0 mmHg    SHUNTS MR Vmax:      566.00 cm/s  Systemic VTI:  0.11 m MR Vmean:     446.0 cm/s   Systemic Diam:  2.30 cm MV E velocity: 98.55 cm/s Dietrich Pates MD Electronically signed by Dietrich Pates MD Signature Date/Time: 05/14/2020/11:46:22 AM    Final    VAS US CAROTID (at [facility] and [facility] only)  Result Date: 05/13/2020 Carotid Arterial Duplex Study Indications:      TIA. Risk Factors:     Hypertension, hyperlipidemia, Diabetes, coronary artery                   disease. Comparison Study: 02/21/17 previous Performing Technologist: Blanch Media RVS  Examination Guidelines: A complete evaluation includes B-mode imaging, spectral Doppler, color Doppler, and power Doppler as needed of all accessible portions of each vessel. Bilateral testing  is considered an integral part of a complete examination. Limited examinations for reoccurring indications may be performed as noted.  Right Carotid Findings: +----------+--------+--------+--------+-------------------+--------------------+           PSV cm/sEDV cm/sStenosisPlaque Description Comments             +----------+--------+--------+--------+-------------------+--------------------+ CCA Prox  86      21              heterogenous                            +----------+--------+--------+--------+-------------------+--------------------+ CCA Distal95      14              heterogenous                            +----------+--------+--------+--------+-------------------+--------------------+ ICA Prox  124     27      1-39%   irregular, calcificVelocities may                                         and heterogenous   underestimate degree                                                      of stenosis due to                                                        more proximal                                                             obstruction.         +----------+--------+--------+--------+-------------------+--------------------+ ICA Distal92      38                                                      +----------+--------+--------+--------+-------------------+--------------------+ ECA       263                                                             +----------+--------+--------+--------+-------------------+--------------------+ +----------+--------+-------+--------+-------------------+  PSV cm/sEDV cmsDescribeArm Pressure (mmHG) +----------+--------+-------+--------+-------------------+ Subclavian122                                        +----------+--------+-------+--------+-------------------+ +---------+--------+--------+--------------+ VertebralPSV cm/sEDV cm/sNot identified  +---------+--------+--------+--------------+  Left Carotid Findings: +----------+-------+-------+--------+---------------------------------+--------+           PSV    EDV    StenosisPlaque Description               Comments           cm/s   cm/s                                                     +----------+-------+-------+--------+---------------------------------+--------+ CCA Prox  118    16             heterogenous                              +----------+-------+-------+--------+---------------------------------+--------+ CCA Distal79     11             heterogenous                              +----------+-------+-------+--------+---------------------------------+--------+ ICA Prox  221    49     40-59%  irregular, calcific and                                                   heterogenous                              +----------+-------+-------+--------+---------------------------------+--------+ ICA Mid   132    30                                                       +----------+-------+-------+--------+---------------------------------+--------+ ICA Distal88     21                                                       +----------+-------+-------+--------+---------------------------------+--------+ ECA       140                                                             +----------+-------+-------+--------+---------------------------------+--------+ +----------+--------+--------+--------+-------------------+           PSV cm/sEDV cm/sDescribeArm Pressure (mmHG) +----------+--------+--------+--------+-------------------+ Subclavian70                                          +----------+--------+--------+--------+-------------------+ +---------+--------+--+--------+-+---------+  VertebralPSV cm/s46EDV cm/s9Antegrade +---------+--------+--+--------+-+---------+   Summary: Right Carotid: Velocities in the right ICA are consistent  with a 1-39% stenosis. Left Carotid: Velocities in the left ICA are consistent with a 40-59% stenosis. Vertebrals: Left vertebral artery demonstrates antegrade flow. Right vertebral             artery was not visualized. *See table(s) above for measurements and observations.  Electronically signed by Coral Else MD on 05/13/2020 at 7:12:03 PM.    Final     (Echo, Carotid, EGD, Colonoscopy, ERCP)    Subjective: Patient seen and examined.  No overnight events.  She did not have any complaints.  Denies any weakness.  Remains afebrile.   Discharge Exam: Vitals:   05/15/20 0812 05/15/20 1125  BP: (!) 165/99 (!) 150/76  Pulse: (!) 45 70  Resp: 16 18  Temp: (!) 97.4 F (36.3 C) 98.1 F (36.7 C)  SpO2: 90% 97%   Vitals:   05/15/20 0349 05/15/20 0352 05/15/20 0812 05/15/20 1125  BP: (!) 165/83  (!) 165/99 (!) 150/76  Pulse: (!) 124 90 (!) 45 70  Resp: 19  16 18   Temp: 97.7 F (36.5 C)  (!) 97.4 F (36.3 C) 98.1 F (36.7 C)  TempSrc: Oral  Oral Oral  SpO2: 96% 96% 90% 97%  Weight:      Height:        General: Pt is alert, awake, not in acute distress Chronically sick looking.  Frail and debilitated appropriate for her age. Looks comfortable.  On room air. Patient is alert and oriented.  She has some cognitive decline and she is slow to respond. Cardiovascular: Regular, S1/S2 +, no rubs, no gallops Respiratory: CTA bilaterally, no wheezing, no rhonchi Abdominal: Soft, NT, ND, bowel sounds + Extremities: no edema, no cyanosis    The results of significant diagnostics from this hospitalization (including imaging, microbiology, ancillary and laboratory) are listed below for reference.     Microbiology: Recent Results (from the past 240 hour(s))  SARS CORONAVIRUS 2 (TAT 6-24 HRS) Nasopharyngeal Nasopharyngeal Swab     Status: None   Collection Time: 05/13/20  1:19 PM   Specimen: Nasopharyngeal Swab  Result Value Ref Range Status   SARS Coronavirus 2 NEGATIVE NEGATIVE Final     Comment: (NOTE) SARS-CoV-2 target nucleic acids are NOT DETECTED.  The SARS-CoV-2 RNA is generally detectable in upper and lower respiratory specimens during the acute phase of infection. Negative results do not preclude SARS-CoV-2 infection, do not rule out co-infections with other pathogens, and should not be used as the sole basis for treatment or other patient management decisions. Negative results must be combined with clinical observations, patient history, and epidemiological information. The expected result is Negative.  Fact Sheet for Patients: HairSlick.no  Fact Sheet for Healthcare Providers: quierodirigir.com  This test is not yet approved or cleared by the [place] FDA and  has been authorized for detection and/or diagnosis of SARS-CoV-2 by FDA under an Emergency Use Authorization (EUA). This EUA will remain  in effect (meaning this test can be used) for the duration of the COVID-19 declaration under Se ction 564(b)(1) of the Act, 21 U.S.C. section 360bbb-3(b)(1), unless the authorization is terminated or revoked sooner.  Performed at [facility], [address], [place], [place] [address]   Urine culture     Status: Abnormal (Preliminary result)   Collection Time: 05/13/20  2:42 PM   Specimen: Urine, Random  Result Value Ref Range Status   Specimen Description URINE,  RANDOM  Final   Special Requests NONE  Final   Culture (A)  Final    >=100,000 COLONIES/mL PROTEUS MIRABILIS >=100,000 COLONIES/mL ESCHERICHIA COLI SUSCEPTIBILITIES TO FOLLOW Performed at [facility], [address] [place], [place] [address]    Report Status PENDING  Incomplete     Labs: BNP (last 3 results) No results for input(s): BNP in the last 8760 hours. Basic Metabolic Panel: Recent Labs  Lab 05/13/20 1229 05/15/20 0436  NA 137 140  K 4.1 4.0  CL 99 106  CO2 27 26  GLUCOSE 227* 104*  BUN 44* 36*   CREATININE 1.59* 1.16*  CALCIUM 9.3 8.6*  MG  --  1.8  PHOS  --  2.9   Liver Function Tests: Recent Labs  Lab 05/13/20 1229  AST 22  ALT 11  ALKPHOS 71  BILITOT 0.5  PROT 7.7  ALBUMIN 2.4*   No results for input(s): LIPASE, AMYLASE in the last 168 hours. No results for input(s): AMMONIA in the last 168 hours. CBC: Recent Labs  Lab 05/13/20 1229 05/15/20 0436  WBC 8.8 8.9  NEUTROABS 5.1 5.1  HGB 11.4* 10.9*  HCT 36.7 34.9*  MCV 98.4 97.8  PLT 328 287   Cardiac Enzymes: No results for input(s): CKTOTAL, CKMB, CKMBINDEX, TROPONINI in the last 168 hours. BNP: Invalid input(s): POCBNP CBG: Recent Labs  Lab 05/14/20 1230 05/14/20 1626 05/14/20 2108 05/15/20 0604 05/15/20 1124  GLUCAP 149* 143* 296* 109* 175*   D-Dimer No results for input(s): DDIMER in the last 72 hours. Hgb A1c Recent Labs    05/14/20 0337  HGBA1C 7.6*   Lipid Profile Recent Labs    05/14/20 0337  CHOL 99  HDL 27*  LDLCALC 57  TRIG 74  CHOLHDL 3.7   Thyroid function studies Recent Labs    05/13/20 1846  TSH 1.702   Anemia work up No results for input(s): VITAMINB12, FOLATE, FERRITIN, TIBC, IRON, RETICCTPCT in the last 72 hours. Urinalysis    Component Value Date/Time   COLORURINE YELLOW 05/13/2020 1442   APPEARANCEUR TURBID (A) 05/13/2020 1442   LABSPEC 1.011 05/13/2020 1442   PHURINE 7.0 05/13/2020 1442   GLUCOSEU NEGATIVE 05/13/2020 1442   GLUCOSEU NEG mg/dL 43/32/9518 8416   HGBUR SMALL (A) 05/13/2020 1442   BILIRUBINUR NEGATIVE 05/13/2020 1442   BILIRUBINUR NEGATIUVE 01/15/2018 1428   KETONESUR NEGATIVE 05/13/2020 1442   PROTEINUR 100 (A) 05/13/2020 1442   UROBILINOGEN 1.0 01/15/2018 1428   UROBILINOGEN 0.2 03/07/2010 1517   NITRITE NEGATIVE 05/13/2020 1442   LEUKOCYTESUR MODERATE (A) 05/13/2020 1442   Sepsis Labs Invalid input(s): PROCALCITONIN,  WBC,  LACTICIDVEN Microbiology Recent Results (from the past 240 hour(s))  SARS CORONAVIRUS 2 (TAT 6-24 HRS)  Nasopharyngeal Nasopharyngeal Swab     Status: None   Collection Time: 05/13/20  1:19 PM   Specimen: Nasopharyngeal Swab  Result Value Ref Range Status   SARS Coronavirus 2 NEGATIVE NEGATIVE Final    Comment: (NOTE) SARS-CoV-2 target nucleic acids are NOT DETECTED.  The SARS-CoV-2 RNA is generally detectable in upper and lower respiratory specimens during the acute phase of infection. Negative results do not preclude SARS-CoV-2 infection, do not rule out co-infections with other pathogens, and should not be used as the sole basis for treatment or other patient management decisions. Negative results must be combined with clinical observations, patient history, and epidemiological information. The expected result is Negative.  Fact Sheet for Patients: HairSlick.no  Fact Sheet for Healthcare Providers: quierodirigir.com  This  test is not yet approved or cleared by the [place] and  has been authorized for detection and/or diagnosis of SARS-CoV-2 by FDA under an Emergency Use Authorization (EUA). This EUA will remain  in effect (meaning this test can be used) for the duration of the COVID-19 declaration under Se ction 564(b)(1) of the Act, 21 U.S.C. section 360bbb-3(b)(1), unless the authorization is terminated or revoked sooner.  Performed at [facility], [address] [place], [place] [address]   Urine culture     Status: Abnormal (Preliminary result)   Collection Time: 05/13/20  2:42 PM   Specimen: Urine, Random  Result Value Ref Range Status   Specimen Description URINE, RANDOM  Final   Special Requests NONE  Final   Culture (A)  Final    >=100,000 COLONIES/mL PROTEUS MIRABILIS >=100,000 COLONIES/mL ESCHERICHIA COLI SUSCEPTIBILITIES TO FOLLOW Performed at [facility], [address] [place], [place] [address]    Report Status PENDING  Incomplete     Time coordinating discharge:  35  minutes  SIGNED:   Dorcas Carrow, MD  [facility] 05/15/2020, 1:10 PM

## 2020-05-15 NOTE — Care Management Obs Status (Signed)
MEDICARE OBSERVATION STATUS NOTIFICATION   Patient Details  Name: [name] MRN: [id number] Date of Birth: 1929-05-29   Medicare Observation Status Notification Given:  Yes    Kermit Balo, RN 05/15/2020, 2:27 PM

## 2020-05-18 DIAGNOSIS — D6489 Other specified anemias: Secondary | ICD-10-CM | POA: Diagnosis not present

## 2020-05-18 DIAGNOSIS — E119 Type 2 diabetes mellitus without complications: Secondary | ICD-10-CM | POA: Diagnosis not present

## 2020-05-18 DIAGNOSIS — F329 Major depressive disorder, single episode, unspecified: Secondary | ICD-10-CM | POA: Diagnosis not present

## 2020-05-18 DIAGNOSIS — B001 Herpesviral vesicular dermatitis: Secondary | ICD-10-CM | POA: Diagnosis not present

## 2020-05-18 DIAGNOSIS — F419 Anxiety disorder, unspecified: Secondary | ICD-10-CM | POA: Diagnosis not present

## 2020-05-18 DIAGNOSIS — I482 Chronic atrial fibrillation, unspecified: Secondary | ICD-10-CM | POA: Diagnosis not present

## 2020-05-18 DIAGNOSIS — E7849 Other hyperlipidemia: Secondary | ICD-10-CM | POA: Diagnosis not present

## 2020-05-18 DIAGNOSIS — E039 Hypothyroidism, unspecified: Secondary | ICD-10-CM | POA: Diagnosis not present

## 2020-05-18 DIAGNOSIS — F0390 Unspecified dementia without behavioral disturbance: Secondary | ICD-10-CM | POA: Diagnosis not present

## 2020-05-20 DIAGNOSIS — M069 Rheumatoid arthritis, unspecified: Secondary | ICD-10-CM | POA: Diagnosis not present

## 2020-05-20 DIAGNOSIS — I10 Essential (primary) hypertension: Secondary | ICD-10-CM | POA: Diagnosis not present

## 2020-05-20 DIAGNOSIS — N183 Chronic kidney disease, stage 3 unspecified: Secondary | ICD-10-CM | POA: Diagnosis not present

## 2020-05-20 DIAGNOSIS — F411 Generalized anxiety disorder: Secondary | ICD-10-CM | POA: Diagnosis not present

## 2020-05-20 DIAGNOSIS — I4891 Unspecified atrial fibrillation: Secondary | ICD-10-CM | POA: Diagnosis not present

## 2020-05-20 DIAGNOSIS — R4701 Aphasia: Secondary | ICD-10-CM | POA: Diagnosis not present

## 2020-05-20 DIAGNOSIS — I639 Cerebral infarction, unspecified: Secondary | ICD-10-CM | POA: Diagnosis not present

## 2020-05-21 DIAGNOSIS — E11621 Type 2 diabetes mellitus with foot ulcer: Secondary | ICD-10-CM | POA: Diagnosis not present

## 2020-05-21 DIAGNOSIS — L97422 Non-pressure chronic ulcer of left heel and midfoot with fat layer exposed: Secondary | ICD-10-CM | POA: Diagnosis not present

## 2020-05-21 DIAGNOSIS — L97522 Non-pressure chronic ulcer of other part of left foot with fat layer exposed: Secondary | ICD-10-CM | POA: Diagnosis not present

## 2020-05-21 DIAGNOSIS — L97529 Non-pressure chronic ulcer of other part of left foot with unspecified severity: Secondary | ICD-10-CM | POA: Diagnosis not present

## 2020-05-21 DIAGNOSIS — E08621 Diabetes mellitus due to underlying condition with foot ulcer: Secondary | ICD-10-CM | POA: Diagnosis not present

## 2020-05-29 DIAGNOSIS — F331 Major depressive disorder, recurrent, moderate: Secondary | ICD-10-CM | POA: Diagnosis not present

## 2020-05-29 DIAGNOSIS — F0151 Vascular dementia with behavioral disturbance: Secondary | ICD-10-CM | POA: Diagnosis not present

## 2020-05-29 DIAGNOSIS — F419 Anxiety disorder, unspecified: Secondary | ICD-10-CM | POA: Diagnosis not present

## 2020-05-29 DIAGNOSIS — F0281 Dementia in other diseases classified elsewhere with behavioral disturbance: Secondary | ICD-10-CM | POA: Diagnosis not present

## 2020-05-29 DIAGNOSIS — G4700 Insomnia, unspecified: Secondary | ICD-10-CM | POA: Diagnosis not present

## 2020-05-30 DIAGNOSIS — F419 Anxiety disorder, unspecified: Secondary | ICD-10-CM | POA: Diagnosis not present

## 2020-05-30 DIAGNOSIS — I10 Essential (primary) hypertension: Secondary | ICD-10-CM | POA: Diagnosis not present

## 2020-05-30 DIAGNOSIS — F329 Major depressive disorder, single episode, unspecified: Secondary | ICD-10-CM | POA: Diagnosis not present

## 2020-05-30 DIAGNOSIS — I482 Chronic atrial fibrillation, unspecified: Secondary | ICD-10-CM | POA: Diagnosis not present

## 2020-05-30 DIAGNOSIS — F0390 Unspecified dementia without behavioral disturbance: Secondary | ICD-10-CM | POA: Diagnosis not present

## 2020-05-30 DIAGNOSIS — I5032 Chronic diastolic (congestive) heart failure: Secondary | ICD-10-CM | POA: Diagnosis not present

## 2020-05-30 DIAGNOSIS — G459 Transient cerebral ischemic attack, unspecified: Secondary | ICD-10-CM | POA: Diagnosis not present

## 2020-06-04 DIAGNOSIS — F0390 Unspecified dementia without behavioral disturbance: Secondary | ICD-10-CM | POA: Diagnosis not present

## 2020-06-04 DIAGNOSIS — I5032 Chronic diastolic (congestive) heart failure: Secondary | ICD-10-CM | POA: Diagnosis not present

## 2020-06-04 DIAGNOSIS — I482 Chronic atrial fibrillation, unspecified: Secondary | ICD-10-CM | POA: Diagnosis not present

## 2020-06-04 DIAGNOSIS — R110 Nausea: Secondary | ICD-10-CM | POA: Diagnosis not present

## 2020-06-04 DIAGNOSIS — M069 Rheumatoid arthritis, unspecified: Secondary | ICD-10-CM | POA: Diagnosis not present

## 2020-06-04 DIAGNOSIS — N3020 Other chronic cystitis without hematuria: Secondary | ICD-10-CM | POA: Diagnosis not present

## 2020-06-05 DIAGNOSIS — I482 Chronic atrial fibrillation, unspecified: Secondary | ICD-10-CM | POA: Diagnosis not present

## 2020-06-05 DIAGNOSIS — N1832 Chronic kidney disease, stage 3b: Secondary | ICD-10-CM | POA: Diagnosis not present

## 2020-06-05 DIAGNOSIS — I10 Essential (primary) hypertension: Secondary | ICD-10-CM | POA: Diagnosis not present

## 2020-06-05 DIAGNOSIS — R110 Nausea: Secondary | ICD-10-CM | POA: Diagnosis not present

## 2020-06-05 DIAGNOSIS — F419 Anxiety disorder, unspecified: Secondary | ICD-10-CM | POA: Diagnosis not present

## 2020-06-05 DIAGNOSIS — I5032 Chronic diastolic (congestive) heart failure: Secondary | ICD-10-CM | POA: Diagnosis not present

## 2020-06-05 DIAGNOSIS — M069 Rheumatoid arthritis, unspecified: Secondary | ICD-10-CM | POA: Diagnosis not present

## 2020-06-11 DIAGNOSIS — L97422 Non-pressure chronic ulcer of left heel and midfoot with fat layer exposed: Secondary | ICD-10-CM | POA: Diagnosis not present

## 2020-06-11 DIAGNOSIS — E11621 Type 2 diabetes mellitus with foot ulcer: Secondary | ICD-10-CM | POA: Diagnosis not present

## 2020-06-11 DIAGNOSIS — L97412 Non-pressure chronic ulcer of right heel and midfoot with fat layer exposed: Secondary | ICD-10-CM | POA: Diagnosis not present

## 2020-06-11 DIAGNOSIS — L97522 Non-pressure chronic ulcer of other part of left foot with fat layer exposed: Secondary | ICD-10-CM | POA: Diagnosis not present

## 2020-06-14 DIAGNOSIS — F419 Anxiety disorder, unspecified: Secondary | ICD-10-CM | POA: Diagnosis not present

## 2020-06-14 DIAGNOSIS — F329 Major depressive disorder, single episode, unspecified: Secondary | ICD-10-CM | POA: Diagnosis not present

## 2020-06-14 DIAGNOSIS — I482 Chronic atrial fibrillation, unspecified: Secondary | ICD-10-CM | POA: Diagnosis not present

## 2020-06-14 DIAGNOSIS — F0390 Unspecified dementia without behavioral disturbance: Secondary | ICD-10-CM | POA: Diagnosis not present

## 2020-06-14 DIAGNOSIS — E119 Type 2 diabetes mellitus without complications: Secondary | ICD-10-CM | POA: Diagnosis not present

## 2020-06-14 DIAGNOSIS — I5032 Chronic diastolic (congestive) heart failure: Secondary | ICD-10-CM | POA: Diagnosis not present

## 2020-06-15 DIAGNOSIS — N3020 Other chronic cystitis without hematuria: Secondary | ICD-10-CM | POA: Diagnosis not present

## 2020-06-15 DIAGNOSIS — I482 Chronic atrial fibrillation, unspecified: Secondary | ICD-10-CM | POA: Diagnosis not present

## 2020-06-15 DIAGNOSIS — I5032 Chronic diastolic (congestive) heart failure: Secondary | ICD-10-CM | POA: Diagnosis not present

## 2020-06-15 DIAGNOSIS — F0390 Unspecified dementia without behavioral disturbance: Secondary | ICD-10-CM | POA: Diagnosis not present

## 2020-06-15 DIAGNOSIS — N1832 Chronic kidney disease, stage 3b: Secondary | ICD-10-CM | POA: Diagnosis not present

## 2020-06-15 DIAGNOSIS — F419 Anxiety disorder, unspecified: Secondary | ICD-10-CM | POA: Diagnosis not present

## 2020-06-18 DIAGNOSIS — L97529 Non-pressure chronic ulcer of other part of left foot with unspecified severity: Secondary | ICD-10-CM | POA: Diagnosis not present

## 2020-06-19 DIAGNOSIS — I482 Chronic atrial fibrillation, unspecified: Secondary | ICD-10-CM | POA: Diagnosis not present

## 2020-06-19 DIAGNOSIS — F329 Major depressive disorder, single episode, unspecified: Secondary | ICD-10-CM | POA: Diagnosis not present

## 2020-06-19 DIAGNOSIS — N3020 Other chronic cystitis without hematuria: Secondary | ICD-10-CM | POA: Diagnosis not present

## 2020-06-19 DIAGNOSIS — M069 Rheumatoid arthritis, unspecified: Secondary | ICD-10-CM | POA: Diagnosis not present

## 2020-06-19 DIAGNOSIS — I5032 Chronic diastolic (congestive) heart failure: Secondary | ICD-10-CM | POA: Diagnosis not present

## 2020-06-19 DIAGNOSIS — F0390 Unspecified dementia without behavioral disturbance: Secondary | ICD-10-CM | POA: Diagnosis not present

## 2020-06-23 DIAGNOSIS — L97522 Non-pressure chronic ulcer of other part of left foot with fat layer exposed: Secondary | ICD-10-CM | POA: Diagnosis not present

## 2020-06-23 DIAGNOSIS — E08621 Diabetes mellitus due to underlying condition with foot ulcer: Secondary | ICD-10-CM | POA: Diagnosis not present

## 2020-06-23 DIAGNOSIS — L97412 Non-pressure chronic ulcer of right heel and midfoot with fat layer exposed: Secondary | ICD-10-CM | POA: Diagnosis not present

## 2020-06-23 DIAGNOSIS — L97422 Non-pressure chronic ulcer of left heel and midfoot with fat layer exposed: Secondary | ICD-10-CM | POA: Diagnosis not present

## 2020-06-23 DIAGNOSIS — E11621 Type 2 diabetes mellitus with foot ulcer: Secondary | ICD-10-CM | POA: Diagnosis not present

## 2020-06-25 DIAGNOSIS — G4700 Insomnia, unspecified: Secondary | ICD-10-CM | POA: Diagnosis not present

## 2020-06-25 DIAGNOSIS — F331 Major depressive disorder, recurrent, moderate: Secondary | ICD-10-CM | POA: Diagnosis not present

## 2020-06-25 DIAGNOSIS — G301 Alzheimer's disease with late onset: Secondary | ICD-10-CM | POA: Diagnosis not present

## 2020-06-25 DIAGNOSIS — F0151 Vascular dementia with behavioral disturbance: Secondary | ICD-10-CM | POA: Diagnosis not present

## 2020-06-26 DIAGNOSIS — I5032 Chronic diastolic (congestive) heart failure: Secondary | ICD-10-CM | POA: Diagnosis not present

## 2020-06-26 DIAGNOSIS — M069 Rheumatoid arthritis, unspecified: Secondary | ICD-10-CM | POA: Diagnosis not present

## 2020-06-26 DIAGNOSIS — I482 Chronic atrial fibrillation, unspecified: Secondary | ICD-10-CM | POA: Diagnosis not present

## 2020-06-26 DIAGNOSIS — L89892 Pressure ulcer of other site, stage 2: Secondary | ICD-10-CM | POA: Diagnosis not present

## 2020-06-26 DIAGNOSIS — N3020 Other chronic cystitis without hematuria: Secondary | ICD-10-CM | POA: Diagnosis not present

## 2020-06-30 DIAGNOSIS — F0390 Unspecified dementia without behavioral disturbance: Secondary | ICD-10-CM | POA: Diagnosis not present

## 2020-06-30 DIAGNOSIS — N3020 Other chronic cystitis without hematuria: Secondary | ICD-10-CM | POA: Diagnosis not present

## 2020-06-30 DIAGNOSIS — F419 Anxiety disorder, unspecified: Secondary | ICD-10-CM | POA: Diagnosis not present

## 2020-06-30 DIAGNOSIS — F329 Major depressive disorder, single episode, unspecified: Secondary | ICD-10-CM | POA: Diagnosis not present

## 2020-06-30 DIAGNOSIS — G459 Transient cerebral ischemic attack, unspecified: Secondary | ICD-10-CM | POA: Diagnosis not present

## 2020-06-30 DIAGNOSIS — M069 Rheumatoid arthritis, unspecified: Secondary | ICD-10-CM | POA: Diagnosis not present

## 2020-06-30 DIAGNOSIS — L89892 Pressure ulcer of other site, stage 2: Secondary | ICD-10-CM | POA: Diagnosis not present

## 2020-06-30 DIAGNOSIS — I5032 Chronic diastolic (congestive) heart failure: Secondary | ICD-10-CM | POA: Diagnosis not present

## 2020-06-30 DIAGNOSIS — I482 Chronic atrial fibrillation, unspecified: Secondary | ICD-10-CM | POA: Diagnosis not present

## 2020-07-02 DIAGNOSIS — E1121 Type 2 diabetes mellitus with diabetic nephropathy: Secondary | ICD-10-CM | POA: Diagnosis not present

## 2020-07-04 DIAGNOSIS — F0150 Vascular dementia without behavioral disturbance: Secondary | ICD-10-CM | POA: Diagnosis not present

## 2020-07-04 DIAGNOSIS — M069 Rheumatoid arthritis, unspecified: Secondary | ICD-10-CM | POA: Diagnosis not present

## 2020-07-04 DIAGNOSIS — M6281 Muscle weakness (generalized): Secondary | ICD-10-CM | POA: Diagnosis not present

## 2020-07-04 DIAGNOSIS — I4891 Unspecified atrial fibrillation: Secondary | ICD-10-CM | POA: Diagnosis not present

## 2020-07-04 DIAGNOSIS — E1122 Type 2 diabetes mellitus with diabetic chronic kidney disease: Secondary | ICD-10-CM | POA: Diagnosis not present

## 2020-07-04 DIAGNOSIS — R2689 Other abnormalities of gait and mobility: Secondary | ICD-10-CM | POA: Diagnosis not present

## 2020-07-04 DIAGNOSIS — I10 Essential (primary) hypertension: Secondary | ICD-10-CM | POA: Diagnosis not present

## 2020-07-04 DIAGNOSIS — K219 Gastro-esophageal reflux disease without esophagitis: Secondary | ICD-10-CM | POA: Diagnosis not present

## 2020-07-04 DIAGNOSIS — N184 Chronic kidney disease, stage 4 (severe): Secondary | ICD-10-CM | POA: Diagnosis not present

## 2020-07-07 DIAGNOSIS — M069 Rheumatoid arthritis, unspecified: Secondary | ICD-10-CM | POA: Diagnosis not present

## 2020-07-07 DIAGNOSIS — N184 Chronic kidney disease, stage 4 (severe): Secondary | ICD-10-CM | POA: Diagnosis not present

## 2020-07-07 DIAGNOSIS — E1122 Type 2 diabetes mellitus with diabetic chronic kidney disease: Secondary | ICD-10-CM | POA: Diagnosis not present

## 2020-07-07 DIAGNOSIS — R2689 Other abnormalities of gait and mobility: Secondary | ICD-10-CM | POA: Diagnosis not present

## 2020-07-07 DIAGNOSIS — K219 Gastro-esophageal reflux disease without esophagitis: Secondary | ICD-10-CM | POA: Diagnosis not present

## 2020-07-07 DIAGNOSIS — I10 Essential (primary) hypertension: Secondary | ICD-10-CM | POA: Diagnosis not present

## 2020-07-07 DIAGNOSIS — F329 Major depressive disorder, single episode, unspecified: Secondary | ICD-10-CM | POA: Diagnosis not present

## 2020-07-07 DIAGNOSIS — F0150 Vascular dementia without behavioral disturbance: Secondary | ICD-10-CM | POA: Diagnosis not present

## 2020-07-07 DIAGNOSIS — N3020 Other chronic cystitis without hematuria: Secondary | ICD-10-CM | POA: Diagnosis not present

## 2020-07-07 DIAGNOSIS — M6281 Muscle weakness (generalized): Secondary | ICD-10-CM | POA: Diagnosis not present

## 2020-07-07 DIAGNOSIS — I4891 Unspecified atrial fibrillation: Secondary | ICD-10-CM | POA: Diagnosis not present

## 2020-07-07 DIAGNOSIS — I482 Chronic atrial fibrillation, unspecified: Secondary | ICD-10-CM | POA: Diagnosis not present

## 2020-07-07 DIAGNOSIS — F0390 Unspecified dementia without behavioral disturbance: Secondary | ICD-10-CM | POA: Diagnosis not present

## 2020-07-08 DIAGNOSIS — N183 Chronic kidney disease, stage 3 unspecified: Secondary | ICD-10-CM | POA: Diagnosis not present

## 2020-07-08 DIAGNOSIS — I4891 Unspecified atrial fibrillation: Secondary | ICD-10-CM | POA: Diagnosis not present

## 2020-07-08 DIAGNOSIS — I639 Cerebral infarction, unspecified: Secondary | ICD-10-CM | POA: Diagnosis not present

## 2020-07-08 DIAGNOSIS — I10 Essential (primary) hypertension: Secondary | ICD-10-CM | POA: Diagnosis not present

## 2020-07-08 DIAGNOSIS — R4701 Aphasia: Secondary | ICD-10-CM | POA: Diagnosis not present

## 2020-07-08 DIAGNOSIS — F411 Generalized anxiety disorder: Secondary | ICD-10-CM | POA: Diagnosis not present

## 2020-07-08 DIAGNOSIS — M069 Rheumatoid arthritis, unspecified: Secondary | ICD-10-CM | POA: Diagnosis not present

## 2020-07-09 DIAGNOSIS — I4891 Unspecified atrial fibrillation: Secondary | ICD-10-CM | POA: Diagnosis not present

## 2020-07-09 DIAGNOSIS — I10 Essential (primary) hypertension: Secondary | ICD-10-CM | POA: Diagnosis not present

## 2020-07-09 DIAGNOSIS — R2689 Other abnormalities of gait and mobility: Secondary | ICD-10-CM | POA: Diagnosis not present

## 2020-07-09 DIAGNOSIS — N184 Chronic kidney disease, stage 4 (severe): Secondary | ICD-10-CM | POA: Diagnosis not present

## 2020-07-09 DIAGNOSIS — E1122 Type 2 diabetes mellitus with diabetic chronic kidney disease: Secondary | ICD-10-CM | POA: Diagnosis not present

## 2020-07-09 DIAGNOSIS — M069 Rheumatoid arthritis, unspecified: Secondary | ICD-10-CM | POA: Diagnosis not present

## 2020-07-09 DIAGNOSIS — M6281 Muscle weakness (generalized): Secondary | ICD-10-CM | POA: Diagnosis not present

## 2020-07-09 DIAGNOSIS — F0150 Vascular dementia without behavioral disturbance: Secondary | ICD-10-CM | POA: Diagnosis not present

## 2020-07-09 DIAGNOSIS — K219 Gastro-esophageal reflux disease without esophagitis: Secondary | ICD-10-CM | POA: Diagnosis not present

## 2020-07-10 DIAGNOSIS — K219 Gastro-esophageal reflux disease without esophagitis: Secondary | ICD-10-CM | POA: Diagnosis not present

## 2020-07-10 DIAGNOSIS — I10 Essential (primary) hypertension: Secondary | ICD-10-CM | POA: Diagnosis not present

## 2020-07-10 DIAGNOSIS — M069 Rheumatoid arthritis, unspecified: Secondary | ICD-10-CM | POA: Diagnosis not present

## 2020-07-10 DIAGNOSIS — M6281 Muscle weakness (generalized): Secondary | ICD-10-CM | POA: Diagnosis not present

## 2020-07-10 DIAGNOSIS — I4891 Unspecified atrial fibrillation: Secondary | ICD-10-CM | POA: Diagnosis not present

## 2020-07-10 DIAGNOSIS — R2689 Other abnormalities of gait and mobility: Secondary | ICD-10-CM | POA: Diagnosis not present

## 2020-07-10 DIAGNOSIS — F0150 Vascular dementia without behavioral disturbance: Secondary | ICD-10-CM | POA: Diagnosis not present

## 2020-07-10 DIAGNOSIS — E1122 Type 2 diabetes mellitus with diabetic chronic kidney disease: Secondary | ICD-10-CM | POA: Diagnosis not present

## 2020-07-10 DIAGNOSIS — N184 Chronic kidney disease, stage 4 (severe): Secondary | ICD-10-CM | POA: Diagnosis not present

## 2020-07-13 DIAGNOSIS — I4891 Unspecified atrial fibrillation: Secondary | ICD-10-CM | POA: Diagnosis not present

## 2020-07-13 DIAGNOSIS — E1122 Type 2 diabetes mellitus with diabetic chronic kidney disease: Secondary | ICD-10-CM | POA: Diagnosis not present

## 2020-07-13 DIAGNOSIS — R2689 Other abnormalities of gait and mobility: Secondary | ICD-10-CM | POA: Diagnosis not present

## 2020-07-13 DIAGNOSIS — N184 Chronic kidney disease, stage 4 (severe): Secondary | ICD-10-CM | POA: Diagnosis not present

## 2020-07-13 DIAGNOSIS — I10 Essential (primary) hypertension: Secondary | ICD-10-CM | POA: Diagnosis not present

## 2020-07-13 DIAGNOSIS — F0150 Vascular dementia without behavioral disturbance: Secondary | ICD-10-CM | POA: Diagnosis not present

## 2020-07-13 DIAGNOSIS — M069 Rheumatoid arthritis, unspecified: Secondary | ICD-10-CM | POA: Diagnosis not present

## 2020-07-13 DIAGNOSIS — M6281 Muscle weakness (generalized): Secondary | ICD-10-CM | POA: Diagnosis not present

## 2020-07-13 DIAGNOSIS — K219 Gastro-esophageal reflux disease without esophagitis: Secondary | ICD-10-CM | POA: Diagnosis not present

## 2020-07-14 DIAGNOSIS — M6281 Muscle weakness (generalized): Secondary | ICD-10-CM | POA: Diagnosis not present

## 2020-07-14 DIAGNOSIS — N184 Chronic kidney disease, stage 4 (severe): Secondary | ICD-10-CM | POA: Diagnosis not present

## 2020-07-14 DIAGNOSIS — I4891 Unspecified atrial fibrillation: Secondary | ICD-10-CM | POA: Diagnosis not present

## 2020-07-14 DIAGNOSIS — M069 Rheumatoid arthritis, unspecified: Secondary | ICD-10-CM | POA: Diagnosis not present

## 2020-07-14 DIAGNOSIS — F0150 Vascular dementia without behavioral disturbance: Secondary | ICD-10-CM | POA: Diagnosis not present

## 2020-07-14 DIAGNOSIS — R2689 Other abnormalities of gait and mobility: Secondary | ICD-10-CM | POA: Diagnosis not present

## 2020-07-14 DIAGNOSIS — I10 Essential (primary) hypertension: Secondary | ICD-10-CM | POA: Diagnosis not present

## 2020-07-14 DIAGNOSIS — E1122 Type 2 diabetes mellitus with diabetic chronic kidney disease: Secondary | ICD-10-CM | POA: Diagnosis not present

## 2020-07-14 DIAGNOSIS — K219 Gastro-esophageal reflux disease without esophagitis: Secondary | ICD-10-CM | POA: Diagnosis not present

## 2020-07-15 DIAGNOSIS — R059 Cough, unspecified: Secondary | ICD-10-CM | POA: Diagnosis not present

## 2020-07-15 DIAGNOSIS — N3020 Other chronic cystitis without hematuria: Secondary | ICD-10-CM | POA: Diagnosis not present

## 2020-07-15 DIAGNOSIS — I482 Chronic atrial fibrillation, unspecified: Secondary | ICD-10-CM | POA: Diagnosis not present

## 2020-07-15 DIAGNOSIS — I5032 Chronic diastolic (congestive) heart failure: Secondary | ICD-10-CM | POA: Diagnosis not present

## 2020-07-15 DIAGNOSIS — M069 Rheumatoid arthritis, unspecified: Secondary | ICD-10-CM | POA: Diagnosis not present

## 2020-07-15 DIAGNOSIS — L89892 Pressure ulcer of other site, stage 2: Secondary | ICD-10-CM | POA: Diagnosis not present

## 2020-07-16 DIAGNOSIS — I4891 Unspecified atrial fibrillation: Secondary | ICD-10-CM | POA: Diagnosis not present

## 2020-07-16 DIAGNOSIS — N184 Chronic kidney disease, stage 4 (severe): Secondary | ICD-10-CM | POA: Diagnosis not present

## 2020-07-16 DIAGNOSIS — F0150 Vascular dementia without behavioral disturbance: Secondary | ICD-10-CM | POA: Diagnosis not present

## 2020-07-16 DIAGNOSIS — L89892 Pressure ulcer of other site, stage 2: Secondary | ICD-10-CM | POA: Diagnosis not present

## 2020-07-16 DIAGNOSIS — R059 Cough, unspecified: Secondary | ICD-10-CM | POA: Diagnosis not present

## 2020-07-16 DIAGNOSIS — I5032 Chronic diastolic (congestive) heart failure: Secondary | ICD-10-CM | POA: Diagnosis not present

## 2020-07-16 DIAGNOSIS — R2689 Other abnormalities of gait and mobility: Secondary | ICD-10-CM | POA: Diagnosis not present

## 2020-07-16 DIAGNOSIS — R4182 Altered mental status, unspecified: Secondary | ICD-10-CM | POA: Diagnosis not present

## 2020-07-16 DIAGNOSIS — I482 Chronic atrial fibrillation, unspecified: Secondary | ICD-10-CM | POA: Diagnosis not present

## 2020-07-16 DIAGNOSIS — K219 Gastro-esophageal reflux disease without esophagitis: Secondary | ICD-10-CM | POA: Diagnosis not present

## 2020-07-16 DIAGNOSIS — M069 Rheumatoid arthritis, unspecified: Secondary | ICD-10-CM | POA: Diagnosis not present

## 2020-07-16 DIAGNOSIS — E1122 Type 2 diabetes mellitus with diabetic chronic kidney disease: Secondary | ICD-10-CM | POA: Diagnosis not present

## 2020-07-16 DIAGNOSIS — I10 Essential (primary) hypertension: Secondary | ICD-10-CM | POA: Diagnosis not present

## 2020-07-16 DIAGNOSIS — M6281 Muscle weakness (generalized): Secondary | ICD-10-CM | POA: Diagnosis not present

## 2020-07-17 DIAGNOSIS — R059 Cough, unspecified: Secondary | ICD-10-CM | POA: Diagnosis not present

## 2020-07-17 DIAGNOSIS — I5032 Chronic diastolic (congestive) heart failure: Secondary | ICD-10-CM | POA: Diagnosis not present

## 2020-07-17 DIAGNOSIS — E1122 Type 2 diabetes mellitus with diabetic chronic kidney disease: Secondary | ICD-10-CM | POA: Diagnosis not present

## 2020-07-17 DIAGNOSIS — M069 Rheumatoid arthritis, unspecified: Secondary | ICD-10-CM | POA: Diagnosis not present

## 2020-07-17 DIAGNOSIS — I482 Chronic atrial fibrillation, unspecified: Secondary | ICD-10-CM | POA: Diagnosis not present

## 2020-07-17 DIAGNOSIS — I10 Essential (primary) hypertension: Secondary | ICD-10-CM | POA: Diagnosis not present

## 2020-07-17 DIAGNOSIS — N184 Chronic kidney disease, stage 4 (severe): Secondary | ICD-10-CM | POA: Diagnosis not present

## 2020-07-18 DIAGNOSIS — F0390 Unspecified dementia without behavioral disturbance: Secondary | ICD-10-CM | POA: Diagnosis not present

## 2020-07-18 DIAGNOSIS — I5032 Chronic diastolic (congestive) heart failure: Secondary | ICD-10-CM | POA: Diagnosis not present

## 2020-07-18 DIAGNOSIS — F329 Major depressive disorder, single episode, unspecified: Secondary | ICD-10-CM | POA: Diagnosis not present

## 2020-07-18 DIAGNOSIS — R059 Cough, unspecified: Secondary | ICD-10-CM | POA: Diagnosis not present

## 2020-07-18 DIAGNOSIS — I482 Chronic atrial fibrillation, unspecified: Secondary | ICD-10-CM | POA: Diagnosis not present

## 2020-07-18 DIAGNOSIS — F419 Anxiety disorder, unspecified: Secondary | ICD-10-CM | POA: Diagnosis not present

## 2020-07-21 DIAGNOSIS — F331 Major depressive disorder, recurrent, moderate: Secondary | ICD-10-CM | POA: Diagnosis not present

## 2020-07-21 DIAGNOSIS — G301 Alzheimer's disease with late onset: Secondary | ICD-10-CM | POA: Diagnosis not present

## 2020-07-21 DIAGNOSIS — G4700 Insomnia, unspecified: Secondary | ICD-10-CM | POA: Diagnosis not present

## 2020-07-21 DIAGNOSIS — F0151 Vascular dementia with behavioral disturbance: Secondary | ICD-10-CM | POA: Diagnosis not present

## 2020-07-22 DIAGNOSIS — E1122 Type 2 diabetes mellitus with diabetic chronic kidney disease: Secondary | ICD-10-CM | POA: Diagnosis not present

## 2020-07-22 DIAGNOSIS — K219 Gastro-esophageal reflux disease without esophagitis: Secondary | ICD-10-CM | POA: Diagnosis not present

## 2020-07-22 DIAGNOSIS — F0150 Vascular dementia without behavioral disturbance: Secondary | ICD-10-CM | POA: Diagnosis not present

## 2020-07-22 DIAGNOSIS — R2689 Other abnormalities of gait and mobility: Secondary | ICD-10-CM | POA: Diagnosis not present

## 2020-07-22 DIAGNOSIS — I4891 Unspecified atrial fibrillation: Secondary | ICD-10-CM | POA: Diagnosis not present

## 2020-07-22 DIAGNOSIS — M6281 Muscle weakness (generalized): Secondary | ICD-10-CM | POA: Diagnosis not present

## 2020-07-22 DIAGNOSIS — M069 Rheumatoid arthritis, unspecified: Secondary | ICD-10-CM | POA: Diagnosis not present

## 2020-07-22 DIAGNOSIS — I10 Essential (primary) hypertension: Secondary | ICD-10-CM | POA: Diagnosis not present

## 2020-07-22 DIAGNOSIS — N184 Chronic kidney disease, stage 4 (severe): Secondary | ICD-10-CM | POA: Diagnosis not present

## 2020-07-23 DIAGNOSIS — I10 Essential (primary) hypertension: Secondary | ICD-10-CM | POA: Diagnosis not present

## 2020-07-23 DIAGNOSIS — M069 Rheumatoid arthritis, unspecified: Secondary | ICD-10-CM | POA: Diagnosis not present

## 2020-07-23 DIAGNOSIS — K219 Gastro-esophageal reflux disease without esophagitis: Secondary | ICD-10-CM | POA: Diagnosis not present

## 2020-07-23 DIAGNOSIS — F0150 Vascular dementia without behavioral disturbance: Secondary | ICD-10-CM | POA: Diagnosis not present

## 2020-07-23 DIAGNOSIS — E1122 Type 2 diabetes mellitus with diabetic chronic kidney disease: Secondary | ICD-10-CM | POA: Diagnosis not present

## 2020-07-23 DIAGNOSIS — M6281 Muscle weakness (generalized): Secondary | ICD-10-CM | POA: Diagnosis not present

## 2020-07-23 DIAGNOSIS — N184 Chronic kidney disease, stage 4 (severe): Secondary | ICD-10-CM | POA: Diagnosis not present

## 2020-07-23 DIAGNOSIS — R2689 Other abnormalities of gait and mobility: Secondary | ICD-10-CM | POA: Diagnosis not present

## 2020-07-23 DIAGNOSIS — I4891 Unspecified atrial fibrillation: Secondary | ICD-10-CM | POA: Diagnosis not present

## 2020-07-27 DIAGNOSIS — R2689 Other abnormalities of gait and mobility: Secondary | ICD-10-CM | POA: Diagnosis not present

## 2020-07-27 DIAGNOSIS — K219 Gastro-esophageal reflux disease without esophagitis: Secondary | ICD-10-CM | POA: Diagnosis not present

## 2020-07-27 DIAGNOSIS — M6281 Muscle weakness (generalized): Secondary | ICD-10-CM | POA: Diagnosis not present

## 2020-07-27 DIAGNOSIS — M069 Rheumatoid arthritis, unspecified: Secondary | ICD-10-CM | POA: Diagnosis not present

## 2020-07-27 DIAGNOSIS — I4891 Unspecified atrial fibrillation: Secondary | ICD-10-CM | POA: Diagnosis not present

## 2020-07-27 DIAGNOSIS — F0150 Vascular dementia without behavioral disturbance: Secondary | ICD-10-CM | POA: Diagnosis not present

## 2020-07-27 DIAGNOSIS — N184 Chronic kidney disease, stage 4 (severe): Secondary | ICD-10-CM | POA: Diagnosis not present

## 2020-07-27 DIAGNOSIS — E1122 Type 2 diabetes mellitus with diabetic chronic kidney disease: Secondary | ICD-10-CM | POA: Diagnosis not present

## 2020-07-27 DIAGNOSIS — I10 Essential (primary) hypertension: Secondary | ICD-10-CM | POA: Diagnosis not present

## 2020-07-29 ENCOUNTER — Observation Stay (HOSPITAL_COMMUNITY)
Admission: EM | Admit: 2020-07-29 | Discharge: 2020-07-31 | Disposition: A | Discharge status: Home / Self Care | Payer: Medicare PPO | Attending: Emergency Medicine | Admitting: Emergency Medicine

## 2020-07-29 ENCOUNTER — Emergency Department (HOSPITAL_COMMUNITY): Payer: Medicare PPO

## 2020-07-29 ENCOUNTER — Observation Stay (HOSPITAL_COMMUNITY): Payer: Medicare PPO

## 2020-07-29 ENCOUNTER — Other Ambulatory Visit: Payer: Self-pay

## 2020-07-29 DIAGNOSIS — Z96653 Presence of artificial knee joint, bilateral: Secondary | ICD-10-CM | POA: Diagnosis not present

## 2020-07-29 DIAGNOSIS — Z79899 Other long term (current) drug therapy: Secondary | ICD-10-CM | POA: Insufficient documentation

## 2020-07-29 DIAGNOSIS — I5032 Chronic diastolic (congestive) heart failure: Secondary | ICD-10-CM | POA: Diagnosis not present

## 2020-07-29 DIAGNOSIS — I10 Essential (primary) hypertension: Secondary | ICD-10-CM | POA: Diagnosis not present

## 2020-07-29 DIAGNOSIS — I4891 Unspecified atrial fibrillation: Secondary | ICD-10-CM | POA: Diagnosis not present

## 2020-07-29 DIAGNOSIS — I517 Cardiomegaly: Secondary | ICD-10-CM | POA: Diagnosis not present

## 2020-07-29 DIAGNOSIS — Z951 Presence of aortocoronary bypass graft: Secondary | ICD-10-CM | POA: Insufficient documentation

## 2020-07-29 DIAGNOSIS — E1129 Type 2 diabetes mellitus with other diabetic kidney complication: Secondary | ICD-10-CM | POA: Diagnosis present

## 2020-07-29 DIAGNOSIS — E1122 Type 2 diabetes mellitus with diabetic chronic kidney disease: Secondary | ICD-10-CM | POA: Insufficient documentation

## 2020-07-29 DIAGNOSIS — R4182 Altered mental status, unspecified: Principal | ICD-10-CM | POA: Diagnosis present

## 2020-07-29 DIAGNOSIS — N183 Chronic kidney disease, stage 3 unspecified: Secondary | ICD-10-CM

## 2020-07-29 DIAGNOSIS — Z20822 Contact with and (suspected) exposure to covid-19: Secondary | ICD-10-CM | POA: Diagnosis not present

## 2020-07-29 DIAGNOSIS — R404 Transient alteration of awareness: Secondary | ICD-10-CM | POA: Diagnosis not present

## 2020-07-29 DIAGNOSIS — G9340 Encephalopathy, unspecified: Secondary | ICD-10-CM | POA: Diagnosis not present

## 2020-07-29 DIAGNOSIS — N390 Urinary tract infection, site not specified: Secondary | ICD-10-CM

## 2020-07-29 DIAGNOSIS — I2510 Atherosclerotic heart disease of native coronary artery without angina pectoris: Secondary | ICD-10-CM | POA: Insufficient documentation

## 2020-07-29 DIAGNOSIS — R0902 Hypoxemia: Secondary | ICD-10-CM | POA: Diagnosis not present

## 2020-07-29 DIAGNOSIS — E039 Hypothyroidism, unspecified: Secondary | ICD-10-CM | POA: Diagnosis present

## 2020-07-29 DIAGNOSIS — I130 Hypertensive heart and chronic kidney disease with heart failure and stage 1 through stage 4 chronic kidney disease, or unspecified chronic kidney disease: Secondary | ICD-10-CM | POA: Diagnosis not present

## 2020-07-29 DIAGNOSIS — Z8673 Personal history of transient ischemic attack (TIA), and cerebral infarction without residual deficits: Secondary | ICD-10-CM | POA: Diagnosis not present

## 2020-07-29 DIAGNOSIS — R569 Unspecified convulsions: Secondary | ICD-10-CM

## 2020-07-29 DIAGNOSIS — R410 Disorientation, unspecified: Secondary | ICD-10-CM | POA: Diagnosis not present

## 2020-07-29 DIAGNOSIS — I482 Chronic atrial fibrillation, unspecified: Secondary | ICD-10-CM | POA: Diagnosis not present

## 2020-07-29 DIAGNOSIS — R001 Bradycardia, unspecified: Secondary | ICD-10-CM | POA: Diagnosis not present

## 2020-07-29 DIAGNOSIS — Z7984 Long term (current) use of oral hypoglycemic drugs: Secondary | ICD-10-CM | POA: Insufficient documentation

## 2020-07-29 DIAGNOSIS — N1832 Chronic kidney disease, stage 3b: Secondary | ICD-10-CM | POA: Diagnosis not present

## 2020-07-29 DIAGNOSIS — F0150 Vascular dementia without behavioral disturbance: Secondary | ICD-10-CM | POA: Diagnosis not present

## 2020-07-29 DIAGNOSIS — E785 Hyperlipidemia, unspecified: Secondary | ICD-10-CM | POA: Diagnosis present

## 2020-07-29 DIAGNOSIS — M069 Rheumatoid arthritis, unspecified: Secondary | ICD-10-CM | POA: Diagnosis present

## 2020-07-29 DIAGNOSIS — J329 Chronic sinusitis, unspecified: Secondary | ICD-10-CM

## 2020-07-29 LAB — CBG MONITORING, ED: Glucose-Capillary: 172 mg/dL — ABNORMAL HIGH (ref 70–99)

## 2020-07-29 LAB — COMPREHENSIVE METABOLIC PANEL
ALT: 13 U/L (ref 0–44)
AST: 18 U/L (ref 15–41)
Albumin: 2.7 g/dL — ABNORMAL LOW (ref 3.5–5.0)
Alkaline Phosphatase: 65 U/L (ref 38–126)
Anion gap: 6 (ref 5–15)
BUN: 38 mg/dL — ABNORMAL HIGH (ref 8–23)
CO2: 31 mmol/L (ref 22–32)
Calcium: 9.2 mg/dL (ref 8.9–10.3)
Chloride: 103 mmol/L (ref 98–111)
Creatinine, Ser: 1.38 mg/dL — ABNORMAL HIGH (ref 0.44–1.00)
GFR, Estimated: 37 mL/min — ABNORMAL LOW (ref >60)
Glucose, Bld: 173 mg/dL — ABNORMAL HIGH (ref 70–99)
Potassium: 4.1 mmol/L (ref 3.5–5.1)
Sodium: 140 mmol/L (ref 135–145)
Total Bilirubin: 0.4 mg/dL (ref 0.3–1.2)
Total Protein: 7.4 g/dL (ref 6.5–8.1)

## 2020-07-29 LAB — CBC WITH DIFFERENTIAL/PLATELET
Abs Immature Granulocytes: 0.01 10*3/uL (ref 0.00–0.07)
Basophils Absolute: 0.1 10*3/uL (ref 0.0–0.1)
Basophils Relative: 1 %
Eosinophils Absolute: 0.2 10*3/uL (ref 0.0–0.5)
Eosinophils Relative: 3 %
HCT: 33.7 % — ABNORMAL LOW (ref 36.0–46.0)
Hemoglobin: 10.7 g/dL — ABNORMAL LOW (ref 12.0–15.0)
Immature Granulocytes: 0 %
Lymphocytes Relative: 32 %
Lymphs Abs: 2.5 10*3/uL (ref 0.7–4.0)
MCH: 32.3 pg (ref 26.0–34.0)
MCHC: 31.8 g/dL (ref 30.0–36.0)
MCV: 101.8 fL — ABNORMAL HIGH (ref 80.0–100.0)
Monocytes Absolute: 0.8 10*3/uL (ref 0.1–1.0)
Monocytes Relative: 10 %
Neutro Abs: 4.2 10*3/uL (ref 1.7–7.7)
Neutrophils Relative %: 54 %
Platelets: 219 10*3/uL (ref 150–400)
RBC: 3.31 MIL/uL — ABNORMAL LOW (ref 3.87–5.11)
RDW: 16.5 % — ABNORMAL HIGH (ref 11.5–15.5)
WBC: 7.9 10*3/uL (ref 4.0–10.5)
nRBC: 0 % (ref 0.0–0.2)

## 2020-07-29 LAB — URINALYSIS, ROUTINE W REFLEX MICROSCOPIC
Bilirubin Urine: NEGATIVE
Glucose, UA: NEGATIVE mg/dL
Ketones, ur: NEGATIVE mg/dL
Nitrite: NEGATIVE
Protein, ur: 30 mg/dL — AB
Specific Gravity, Urine: 1.012 (ref 1.005–1.030)
WBC, UA: >50 WBC/hpf — ABNORMAL HIGH (ref 0–5)
pH: 6 (ref 5.0–8.0)

## 2020-07-29 LAB — BRAIN NATRIURETIC PEPTIDE: B Natriuretic Peptide: 454.7 pg/mL — ABNORMAL HIGH (ref 0.0–100.0)

## 2020-07-29 IMAGING — CT CT HEAD W/O CM
4 series · 16 of 47 positions shown, 18 images · non-contrast
Comparison: Head CT and MRI [DATE]

CLINICAL DATA: Altered mental status

EXAM:
CT HEAD WITHOUT CONTRAST
TECHNIQUE: Contiguous axial images were obtained from the base of the skull
through the vertex without intravenous contrast.

[Series 3: head without · axial · non-contrast · 0.45mm/px · z∈[-154,-29]mm · 7 of 35 slices shown, 9 images]
[im 5/35  brain]
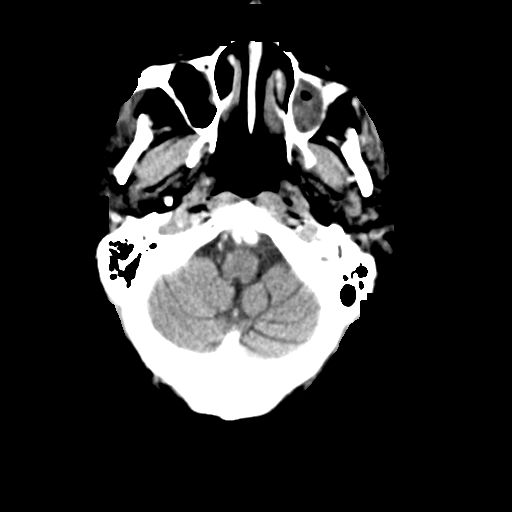
[im 5/35  bone]
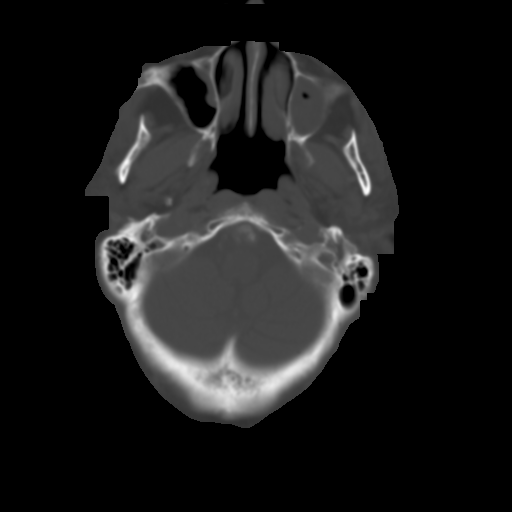
[im 9/35  brain]
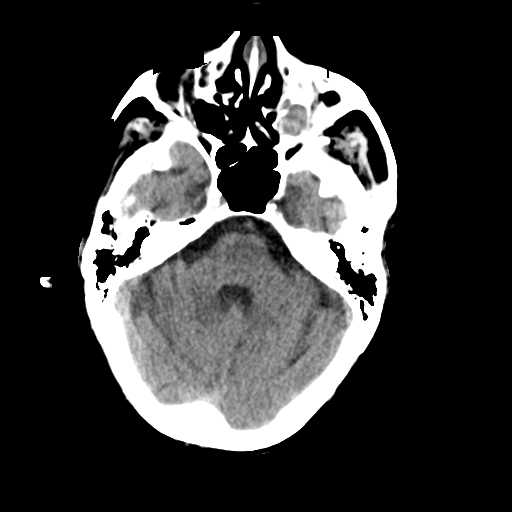
[im 13/35  brain]
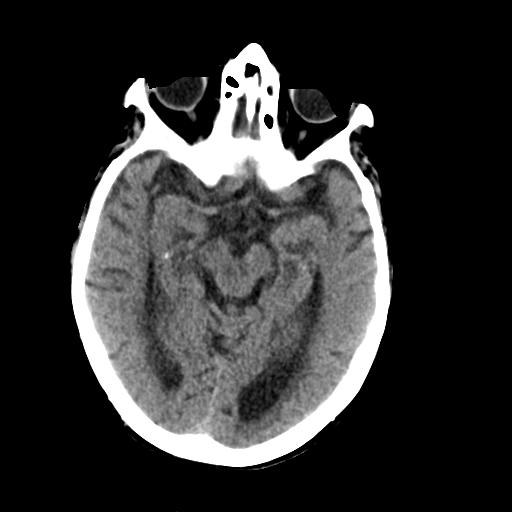
[im 18/35  brain]
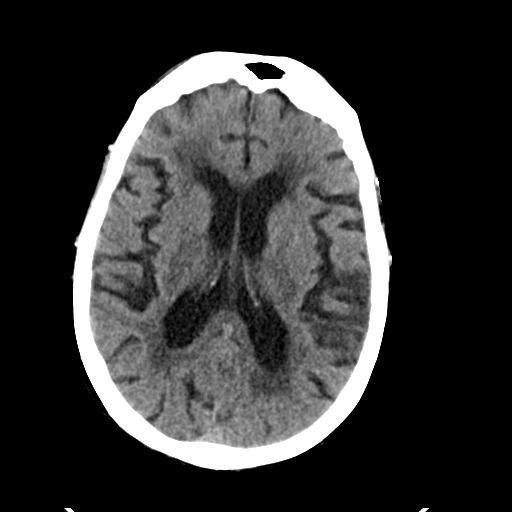
[im 22/35  brain]
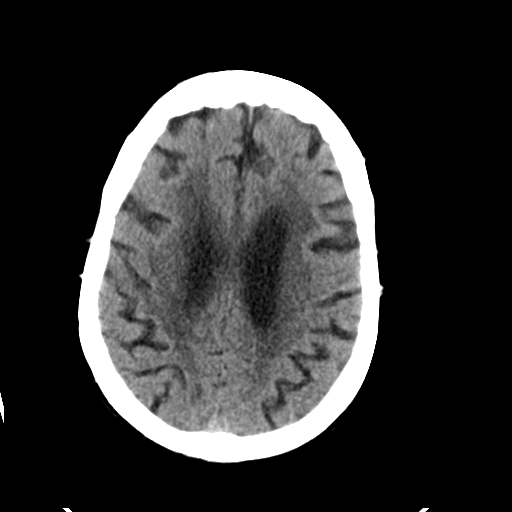
[im 22/35  bone]
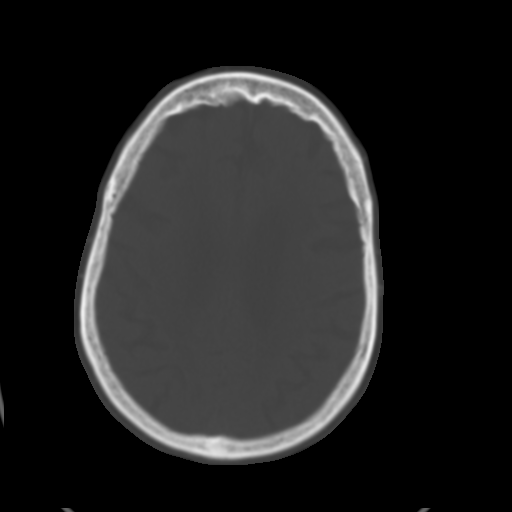
[im 26/35  brain]
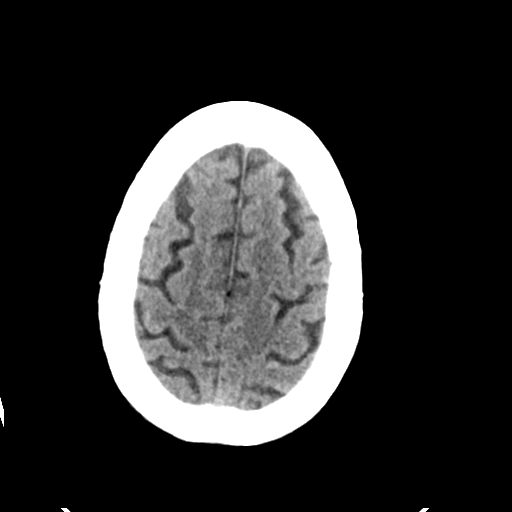
[im 30/35  brain]
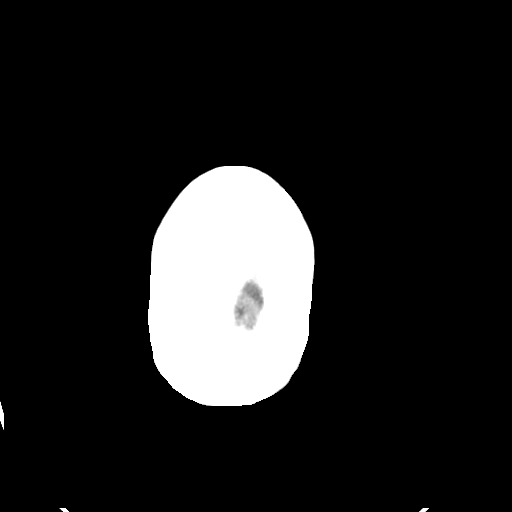

[Series 4: head bone · axial · 0.45mm/px · z∈[-158,-124]mm · 3 of 86 slices shown]
[im 9/86  bone]
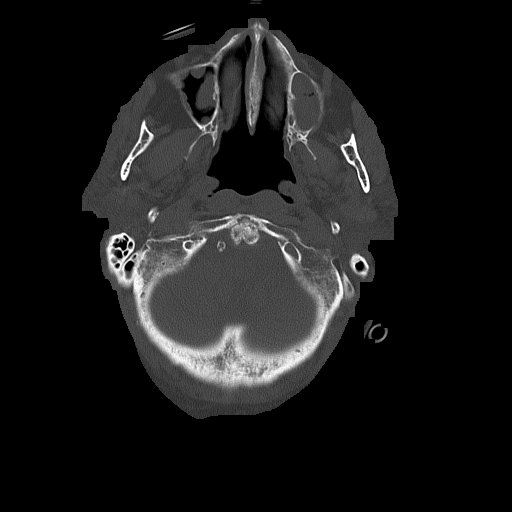
[im 18/86  bone]
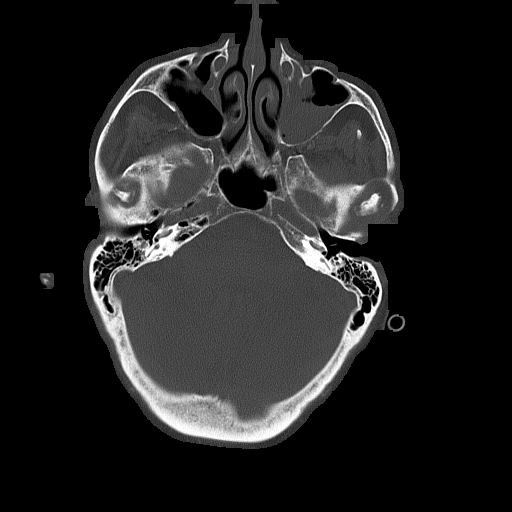
[im 26/86  bone]
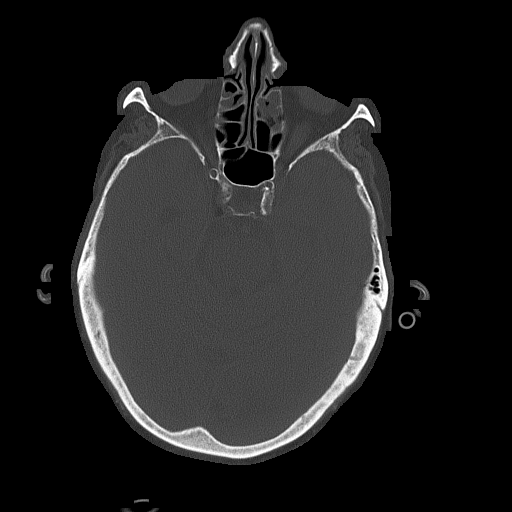

[Series 5: head without cor · coronal · non-contrast · 0.35mm/px · 3 of 69 slices shown]
[im 23/69  brain]
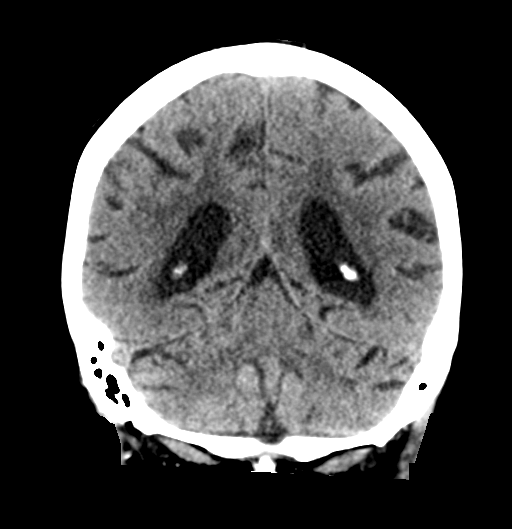
[im 31/69  brain]
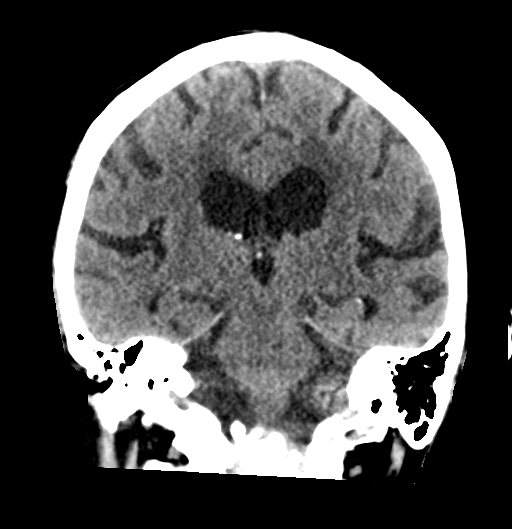
[im 38/69  brain]
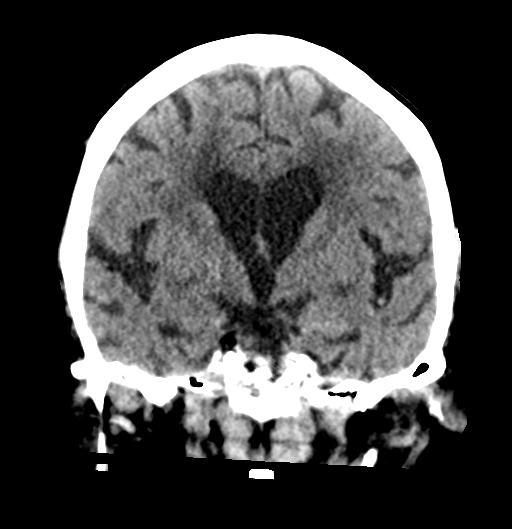

[Series 6: head without sag · sagittal · non-contrast · 0.34mm/px · 3 of 57 slices shown]
[im 19/57  brain]
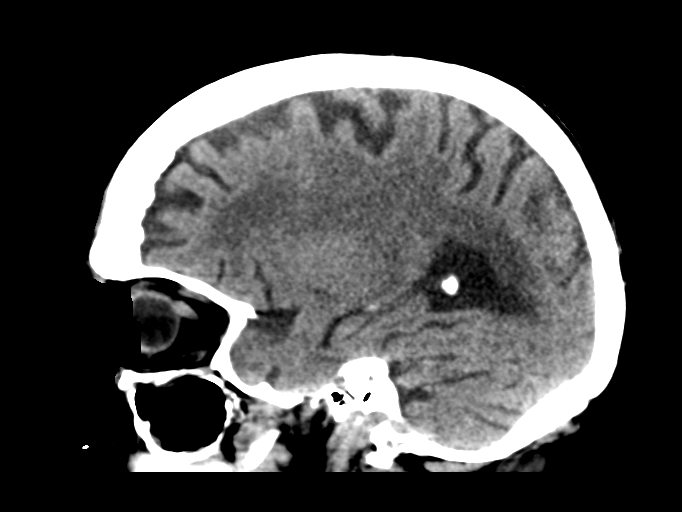
[im 29/57  brain]
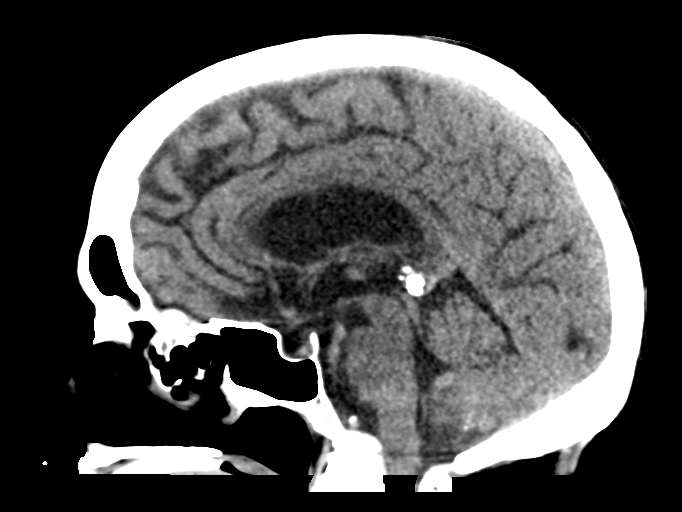
[im 38/57  brain]
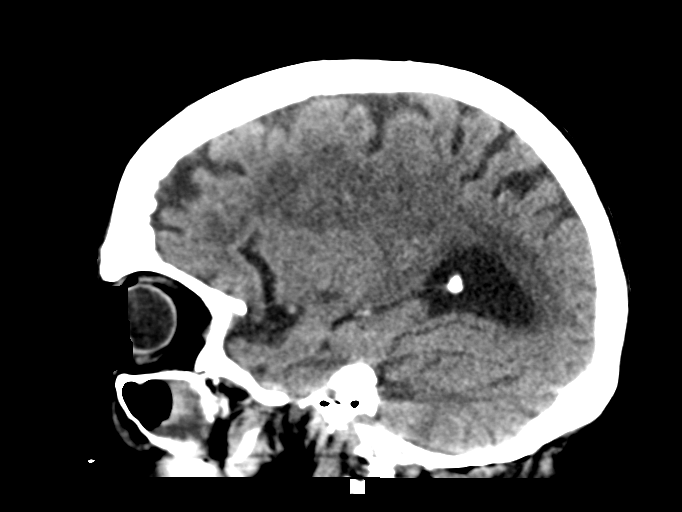

[16 of 47 positions shown; findings below may reference images not displayed]

FINDINGS: Brain: No evidence of acute large vascular territory infarction,
hemorrhage, hydrocephalus, extra-axial collection or mass
lesion/mass effect. Age related global parenchymal volume loss with
ex vacuo dilatation of the ventricular system. Similar moderate
burden of chronic ischemic small vessel white matter disease.
Lacunar type infarcts in the bilateral basal ganglia and right
thalamus.

Vascular: No hyperdense vessel. Atherosclerotic calcifications of
the intracranial portions of the internal carotid and vertebral
arteries.

Skull: Hyperostosis frontalis. Negative for fracture or focal
lesion.

Sinuses/Orbits: Mucosal thickening of the right maxillary sinus and
ethmoid air cells with near complete opacification of the left
maxillary sinus. Prior lens surgery.

Other: None
IMPRESSION: 1. No acute intracranial findings.
2. Age-related global parenchymal volume loss and chronic ischemic
small vessel white matter disease. Lacunar type infarcts in the
bilateral basal ganglia and right thalamus.
3. New paranasal sinus disease as can be seen with sinusitis.

## 2020-07-29 IMAGING — MR MR HEAD W/O CM
10 of 11 series · 42 of 48 positions shown · non-contrast
Comparison: None.

CLINICAL DATA: Encephalopathy

EXAM:
MRI HEAD WITHOUT CONTRAST
TECHNIQUE: Multiplanar, multiecho pulse sequences of the brain and surrounding
structures were obtained without intravenous contrast.

[Series 9: DWI · axial · 3.0mm · 0.88mm/px · z∈[-71,+68]mm · 9 of 96 slices shown (1 of 4)]
[im 1/96]
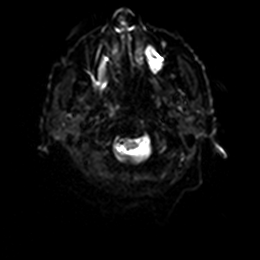
[im 12/96]
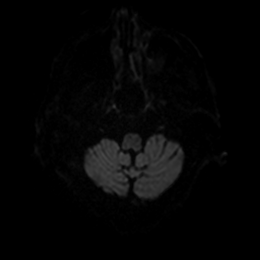
[im 24/96]
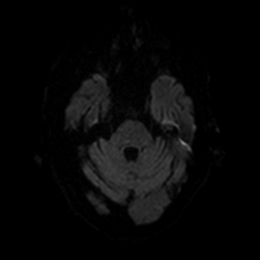
[im 36/96]
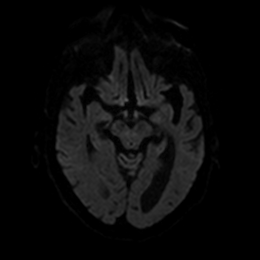
[im 48/96]
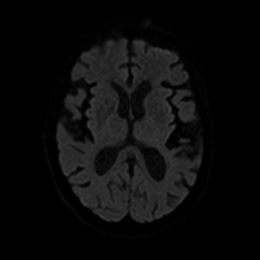
[im 60/96]
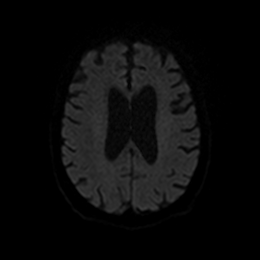
[im 72/96]
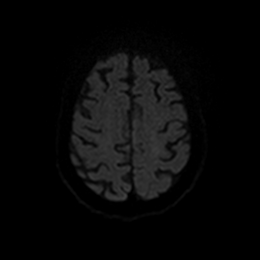
[im 84/96]
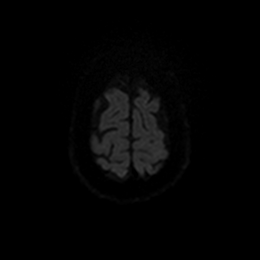
[im 96/96]
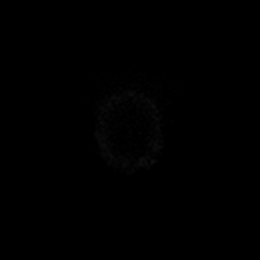

[Series 10: DWI · axial · 3.0mm · 0.88mm/px · z∈[-71,+68]mm · 4 of 48 slices shown (2 of 4)]
[im 1/48]
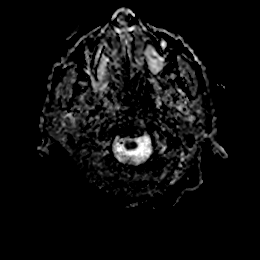
[im 16/48]
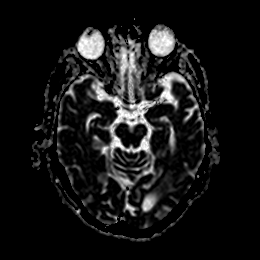
[im 32/48]
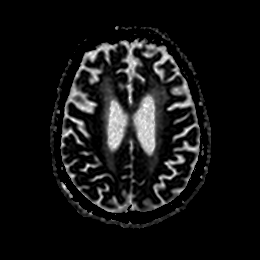
[im 48/48]
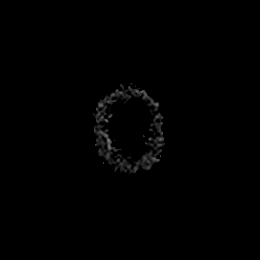

[Series 11: DWI · coronal · 4.0mm · 0.88mm/px · 6 of 66 slices shown (3 of 4)]
[im 1/66]
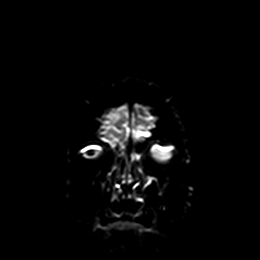
[im 14/66]
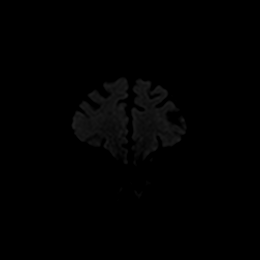
[im 27/66]
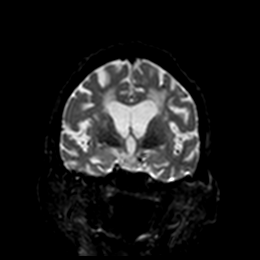
[im 40/66]
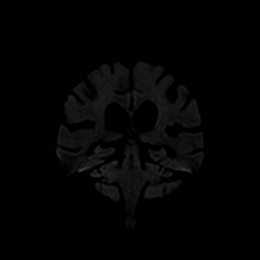
[im 53/66]
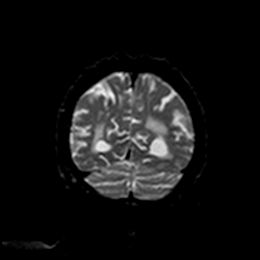
[im 66/66]
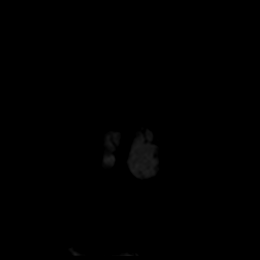

[Series 12: DWI · coronal · 4.0mm · 0.88mm/px · 3 of 33 slices shown (4 of 4)]
[im 1/33]
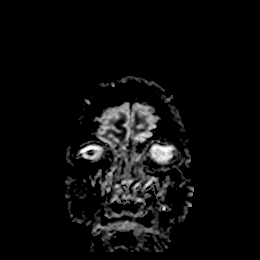
[im 17/33]
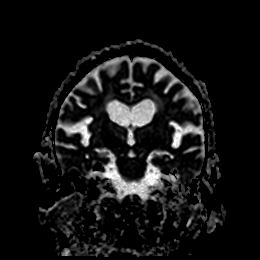
[im 33/33]
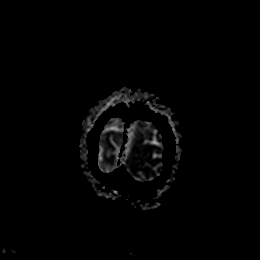

[Series 13: T1 · sagittal · 5.0mm · 0.75mm/px · 2 of 23 slices shown]
[im 1/23]
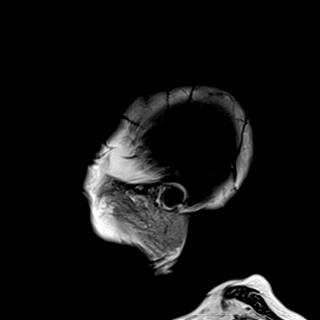
[im 23/23]
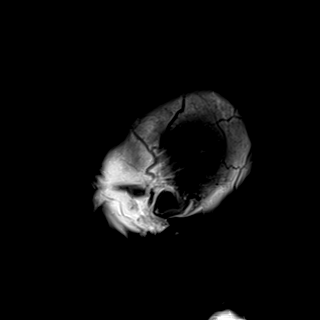

[Series 14: T2 · axial · 5.0mm · 0.72mm/px · z∈[-73,+70]mm · 2 of 25 slices shown (1 of 2)]
[im 1/25]
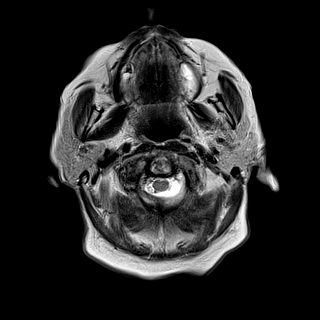
[im 25/25]
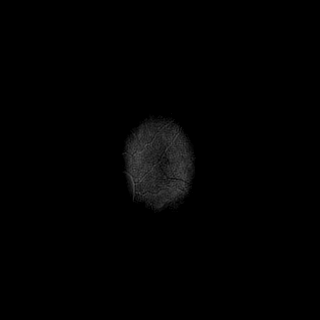

[Series 15: FLAIR · axial · 5.0mm · 0.45mm/px · z∈[-70,+73]mm · 2 of 25 slices shown]
[im 1/25]
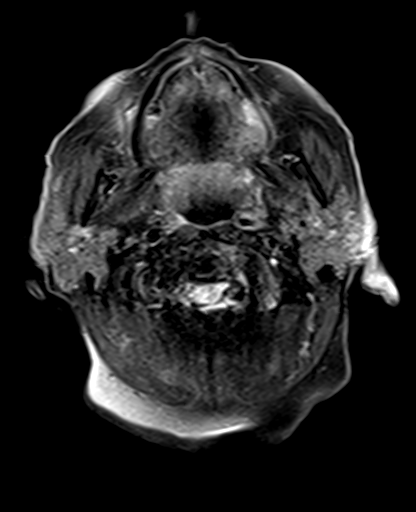
[im 25/25]
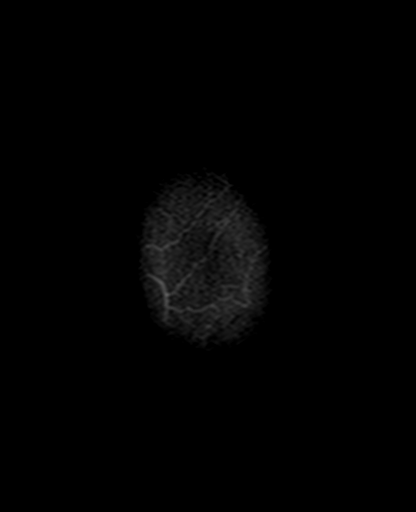

[Series 17: pha_images · axial · 3.0mm · 0.90mm/px · z∈[-87,+71]mm · 5 of 54 slices shown]
[im 1/54]
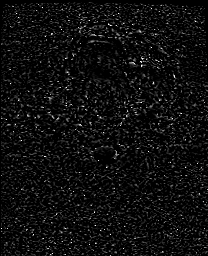
[im 14/54]
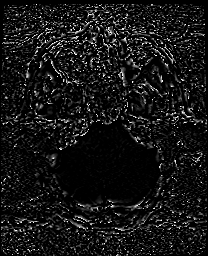
[im 27/54]
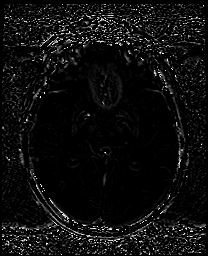
[im 40/54]
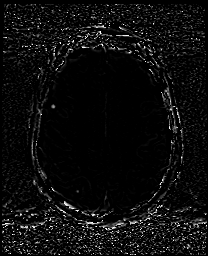
[im 54/54]
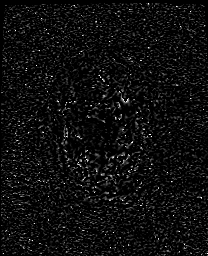

[Series 18: swi_images · axial · 3.0mm · 0.90mm/px · z∈[-87,+88]mm · 6 of 60 slices shown]
[im 1/60]
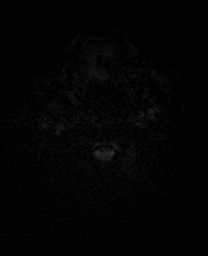
[im 12/60]
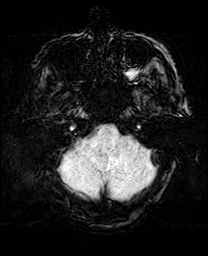
[im 24/60]
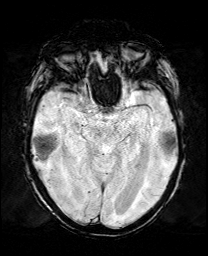
[im 36/60]
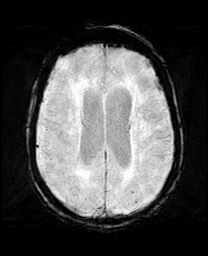
[im 48/60]
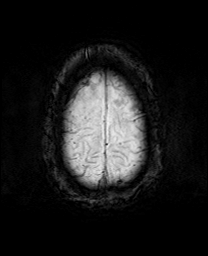
[im 60/60]
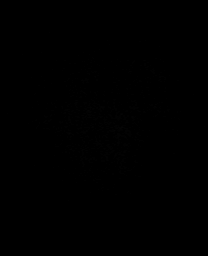

[Series 21: T2 · coronal · 5.0mm · 0.34mm/px · 3 of 29 slices shown (2 of 2)]
[im 1/29]
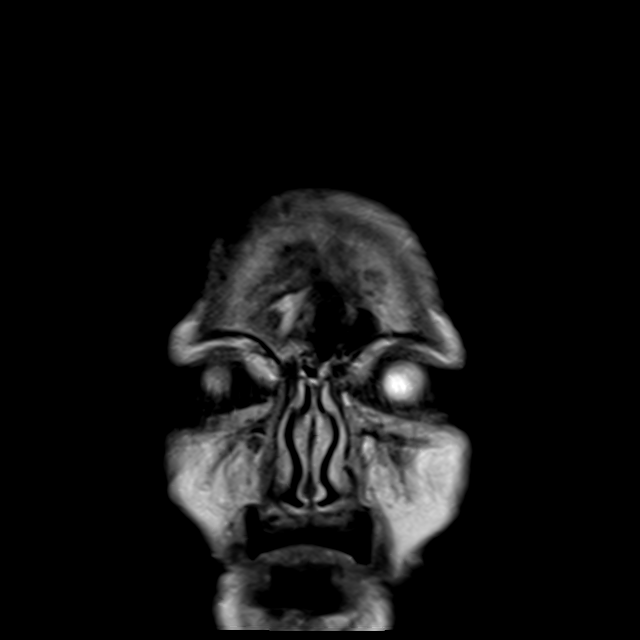
[im 15/29]
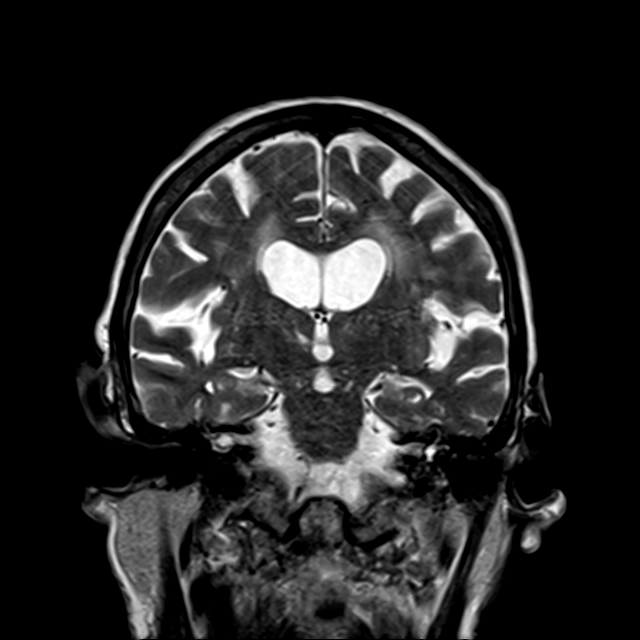
[im 29/29]
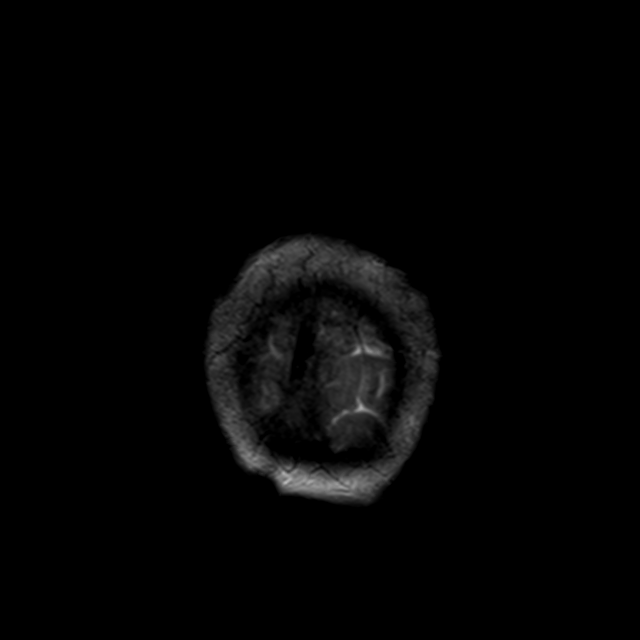

[42 of 48 positions shown; findings below may reference images not displayed]

FINDINGS: Brain: No acute infarct, mass effect or extra-axial collection.
Numerous chronic microhemorrhages in a predominantly peripheral
distribution. Hyperintense T2-weighted signal is moderately
widespread throughout the white matter. Generalized volume loss
without a clear lobar predilection. There are multiple old central
small vessel infarcts. The midline structures are normal.

Vascular: Major flow voids are preserved.

Skull and upper cervical spine: Normal calvarium and skull base.
Visualized upper cervical spine and soft tissues are normal.

Sinuses/Orbits:Bilateral maxillary mucosal thickening. Normal
orbits.
IMPRESSION: 1. No acute intracranial abnormality.
2. Numerous chronic microhemorrhages in a predominantly peripheral
distribution, most consistent with cerebral amyloid angiopathy.
3. Moderate chronic small vessel ischemic disease.

## 2020-07-29 IMAGING — DX DG CHEST 1V PORT
1 series · 1 of 1 positions shown · non-contrast
Comparison: [DATE].

CLINICAL DATA: Altered mental status.

EXAM:
PORTABLE CHEST 1 VIEW

[chest]
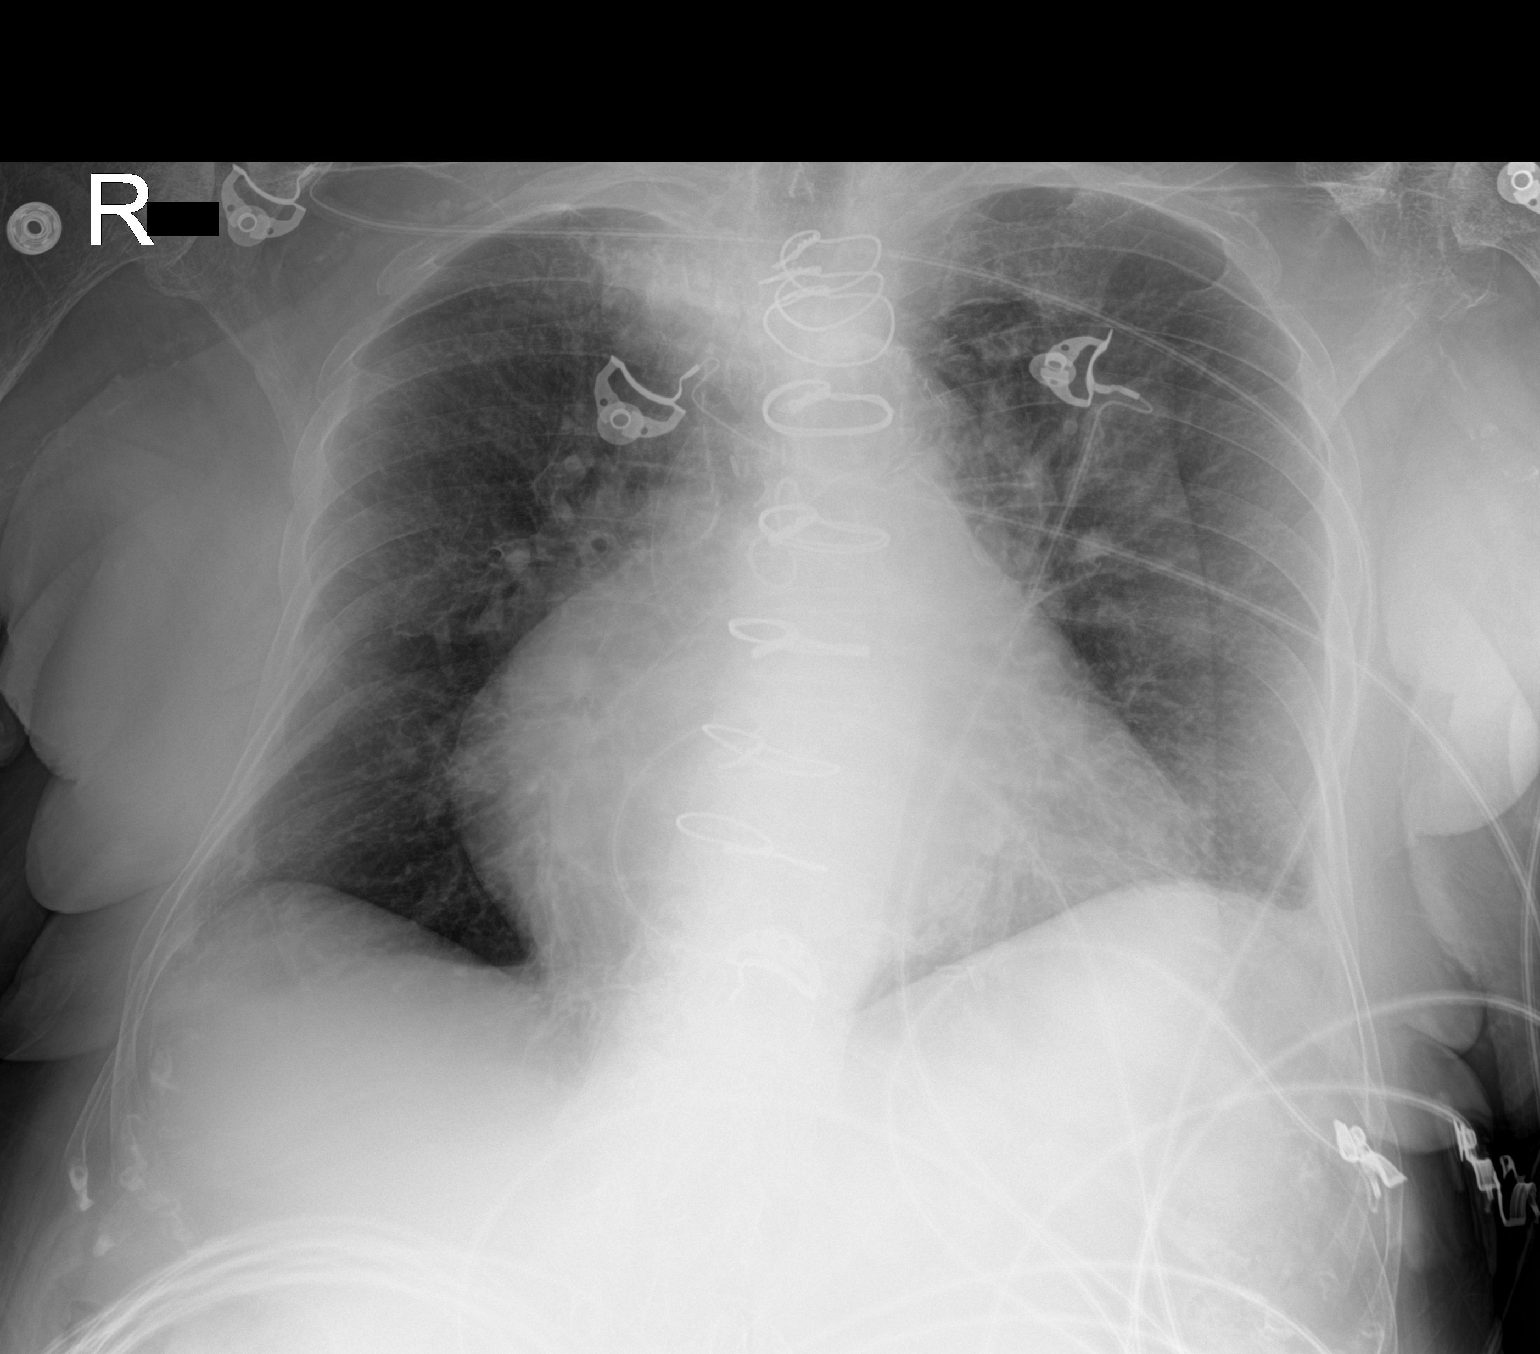

[1 of 1 positions shown; findings below may reference images not displayed]

FINDINGS: Stable cardiomegaly. Status post coronary bypass graft. No
pneumothorax or pleural effusion is noted. No acute pulmonary
disease is noted. Bony thorax is unremarkable.
IMPRESSION: No active disease.

## 2020-07-29 MED ORDER — MEMANTINE HCL 10 MG PO TABS
10.0000 mg | ORAL_TABLET | Freq: Every day | ORAL | Status: DC
  Administered 2020-07-30 (×2): 10 mg via ORAL
  Filled 2020-07-29 (×3): qty 1

## 2020-07-29 MED ORDER — SACCHAROMYCES BOULARDII 250 MG PO CAPS
250.0000 mg | ORAL_CAPSULE | Freq: Two times a day (BID) | ORAL | Status: DC
  Administered 2020-07-30 – 2020-07-31 (×4): 250 mg via ORAL
  Filled 2020-07-29 (×5): qty 1

## 2020-07-29 MED ORDER — INSULIN ASPART 100 UNIT/ML ~~LOC~~ SOLN: 0.0000 [IU] | Freq: Three times a day (TID) | SUBCUTANEOUS | Status: DC

## 2020-07-29 MED ORDER — MIRTAZAPINE 15 MG PO TABS
7.5000 mg | ORAL_TABLET | Freq: Every day | ORAL | Status: DC
  Administered 2020-07-30 (×2): 7.5 mg via ORAL
  Filled 2020-07-29 (×2): qty 1

## 2020-07-29 MED ORDER — VITAMIN D 25 MCG (1000 UNIT) PO TABS
2000.0000 [IU] | ORAL_TABLET | Freq: Every day | ORAL | Status: DC
  Administered 2020-07-30 – 2020-07-31 (×2): 2000 [IU] via ORAL
  Filled 2020-07-29 (×2): qty 2

## 2020-07-29 MED ORDER — VITAMIN B-12 1000 MCG PO TABS
1000.0000 ug | ORAL_TABLET | Freq: Every day | ORAL | Status: DC
  Administered 2020-07-30 – 2020-07-31 (×2): 1000 ug via ORAL
  Filled 2020-07-29 (×2): qty 1

## 2020-07-29 MED ORDER — CALCIUM CARBONATE-VITAMIN D 500-200 MG-UNIT PO TABS
1.0000 | ORAL_TABLET | Freq: Two times a day (BID) | ORAL | Status: DC
  Administered 2020-07-30 – 2020-07-31 (×4): 1 via ORAL
  Filled 2020-07-29 (×5): qty 1

## 2020-07-29 MED ORDER — SULFAMETHOXAZOLE-TRIMETHOPRIM 800-160 MG PO TABS
1.0000 | ORAL_TABLET | Freq: Two times a day (BID) | ORAL | Status: DC
  Administered 2020-07-30: 1 via ORAL
  Filled 2020-07-29: qty 1

## 2020-07-29 MED ORDER — SULFAMETHOXAZOLE-TRIMETHOPRIM 800-160 MG PO TABS
1.0000 | ORAL_TABLET | Freq: Once | ORAL | Status: AC
  Administered 2020-07-29: 1 via ORAL
  Filled 2020-07-29: qty 1

## 2020-07-29 MED ORDER — ACETAMINOPHEN 650 MG RE SUPP: 650.0000 mg | Freq: Four times a day (QID) | RECTAL | Status: DC | PRN

## 2020-07-29 MED ORDER — DOCUSATE SODIUM 100 MG PO CAPS
100.0000 mg | ORAL_CAPSULE | Freq: Two times a day (BID) | ORAL | Status: DC
  Administered 2020-07-30 – 2020-07-31 (×4): 100 mg via ORAL
  Filled 2020-07-29 (×4): qty 1

## 2020-07-29 MED ORDER — METOPROLOL SUCCINATE ER 25 MG PO TB24: 25.0000 mg | ORAL_TABLET | Freq: Every day | ORAL | Status: DC

## 2020-07-29 MED ORDER — DOXYCYCLINE HYCLATE 100 MG PO TABS
100.0000 mg | ORAL_TABLET | Freq: Two times a day (BID) | ORAL | Status: DC
  Administered 2020-07-29 – 2020-07-31 (×4): 100 mg via ORAL
  Filled 2020-07-29 (×4): qty 1

## 2020-07-29 MED ORDER — DILTIAZEM HCL ER COATED BEADS 180 MG PO CP24: 180.0000 mg | ORAL_CAPSULE | Freq: Every day | ORAL | Status: DC

## 2020-07-29 MED ORDER — POLYVINYL ALCOHOL 1.4 % OP SOLN
1.0000 [drp] | Freq: Four times a day (QID) | OPHTHALMIC | Status: DC
  Administered 2020-07-30 – 2020-07-31 (×5): 1 [drp] via OPHTHALMIC
  Filled 2020-07-29: qty 15

## 2020-07-29 MED ORDER — FLUOXETINE HCL 10 MG PO CAPS
10.0000 mg | ORAL_CAPSULE | Freq: Every day | ORAL | Status: DC
  Administered 2020-07-30 – 2020-07-31 (×3): 10 mg via ORAL
  Filled 2020-07-29 (×4): qty 1

## 2020-07-29 MED ORDER — METHOTREXATE 2.5 MG PO TABS
20.0000 mg | ORAL_TABLET | ORAL | Status: DC
  Administered 2020-07-31: 20 mg via ORAL
  Filled 2020-07-29: qty 8

## 2020-07-29 MED ORDER — FERROUS SULFATE 325 (65 FE) MG PO TABS
325.0000 mg | ORAL_TABLET | Freq: Two times a day (BID) | ORAL | Status: DC
  Administered 2020-07-30 – 2020-07-31 (×4): 325 mg via ORAL
  Filled 2020-07-29 (×4): qty 1

## 2020-07-29 MED ORDER — TORSEMIDE 20 MG PO TABS: 50.0000 mg | ORAL_TABLET | Freq: Two times a day (BID) | ORAL | Status: DC

## 2020-07-29 MED ORDER — FOLIC ACID 1 MG PO TABS
1.0000 mg | ORAL_TABLET | Freq: Every day | ORAL | Status: DC
  Administered 2020-07-30 – 2020-07-31 (×2): 1 mg via ORAL
  Filled 2020-07-29 (×2): qty 1

## 2020-07-29 MED ORDER — PRAVASTATIN SODIUM 40 MG PO TABS
40.0000 mg | ORAL_TABLET | Freq: Every day | ORAL | Status: DC
  Administered 2020-07-30 (×2): 40 mg via ORAL
  Filled 2020-07-29 (×2): qty 1

## 2020-07-29 MED ORDER — ACETAMINOPHEN 325 MG PO TABS: 650.0000 mg | ORAL_TABLET | Freq: Four times a day (QID) | ORAL | Status: DC | PRN

## 2020-07-29 MED ORDER — LEVOTHYROXINE SODIUM 75 MCG PO TABS
75.0000 ug | ORAL_TABLET | Freq: Every day | ORAL | Status: DC
  Administered 2020-07-30 – 2020-07-31 (×2): 75 ug via ORAL
  Filled 2020-07-29 (×2): qty 1

## 2020-07-29 MED ORDER — INSULIN ASPART 100 UNIT/ML ~~LOC~~ SOLN: 0.0000 [IU] | Freq: Every day | SUBCUTANEOUS | Status: DC

## 2020-07-29 MED ORDER — POLYETHYLENE GLYCOL 3350 17 G PO PACK
17.0000 g | PACK | Freq: Every day | ORAL | Status: DC
  Administered 2020-07-30 – 2020-07-31 (×2): 17 g via ORAL
  Filled 2020-07-29 (×2): qty 1

## 2020-07-29 NOTE — ED Notes (Signed)
MRI HAS CALLED

## 2020-07-29 NOTE — ED Provider Notes (Signed)
[facility] EMERGENCY DEPARTMENT Provider Note   CSN: [id number] Arrival date & time: 07/29/20  1122     History Chief Complaint  Patient presents with  . Altered Mental Status    [name] is a 87 y.o. female.  HPI 87 year old female with a history of acute pancreatitis, atrial fibrillation, CAD, CHF, DM type II presents to the ER with altered mental status.  Patient presented from [facility], reportedly at around 9 AM this morning she was at her baseline, alert and oriented.  At around 10 AM she had an episode where she drew both arms up to the chest, had very tense muscle tone, and this lasted for approximately 5 minutes without any tonic-clonic movement.  Patient was altered after the episode and continues to be altered here in the ER.  She does have a history of dementia and stroke with left-sided paralysis.  She is on Pradaxa for anticoagulation.  Level 5 caveat as the patient is alert and oriented to self only.    Past Medical History:  Diagnosis Date  . Acute pancreatitis   . Anemia   . Arthritis    RA  . Atrial fibrillation (HCC)   . CAD (coronary artery disease)   . Carotid artery occlusion   . CHF (congestive heart failure) (HCC)   . Diabetes mellitus age 8  . DJD (degenerative joint disease)   . DVT (deep venous thrombosis) (HCC)   . GERD (gastroesophageal reflux disease)   . GI bleed   . Hyperlipidemia   . Hypertension   . Joint pain   . Mesenteric ischemia   . Muscle weakness (generalized)   . Peripheral vascular disease (HCC)   . Renal disorder    Stage IV  . Thyroid disease   . Vascular dementia [facility])     Patient Active Problem List   Diagnosis Date Noted  . TIA (transient ischemic attack) 05/13/2020  . Vascular dementia, uncomplicated (HCC) 05/13/2020  . AKI (acute kidney injury) (HCC) 01/17/2018  . MDD (major depressive disorder) 01/15/2018  . Macrocytic anemia 09/22/2017  . Proteinuria 04/06/2017  . Insomnia  12/01/2016  . CAD (coronary artery disease) 11/17/2016  . Hyperlipidemia 11/17/2016  . Hypertension 11/17/2016  . Aortic atherosclerosis (HCC) 11/03/2016  . CKD (chronic kidney disease) stage 3, GFR 30-59 ml/min (HCC) 04/14/2015  . Colonic constipation 04/14/2015  . Frequent UTI 04/01/2015  . Diabetes mellitus with renal complications (HCC) 03/03/2015  . Atrophic vaginitis 02/24/2015  . Rheumatoid arthritis (HCC) 02/18/2015  . DNR (do not resuscitate) 02/13/2015  . Occlusion and stenosis of carotid artery without mention of cerebral infarction 05/02/2013  . 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 FEMORAL BRUIT 11/24/2009  . Hypothyroidism 06/26/2009  . CEREBROVASCULAR DISEASE 06/25/2009  . ATRIAL FIBRILLATION 05/19/2008  . Chronic diastolic heart failure (HCC) 05/19/2008  . RENAL ARTERY STENOSIS 05/19/2008  . PERIPHERAL VASCULAR DISEASE 05/19/2008    Past Surgical History:  Procedure Laterality Date  . APPENDECTOMY    . CELIAC ARTERY STENT    . CORONARY ARTERY BYPASS GRAFT    . EMBOLECTOMY  2009   right leg  . FASCIOTOMY     right leg  . HIP FRACTURE SURGERY Left   . JOINT REPLACEMENT    . TOTAL KNEE ARTHROPLASTY     bilateral  . TUBAL LIGATION    . VISCERAL ANGIOGRAM N/A 05/31/2013   Procedure: MESSENTERIC [name];  Surgeon: [name], MD;  Location: [facility] CATH LAB;  Service: Cardiovascular;  Laterality: N/A;     OB History   No obstetric history on file.     Family History  Problem Relation Age of Onset  . Cancer Mother        ovarian  . Other Father        suicide  . Coronary artery disease Sister   . Other Sister        alzheimers  . Heart disease Sister        Before age 3  . Diabetes Sister   . Cancer Daughter        spinal tumor  . Diabetes Daughter   . Hypertension Daughter   . Diabetes Son   . Hyperlipidemia Son   . Hypertension Son     Social History   Tobacco Use  . Smoking status: Never Smoker  . Smokeless tobacco:  Never Used  Vaping Use  . Vaping Use: Never used  Substance Use Topics  . Alcohol use: No  . Drug use: No    Home Medications Prior to Admission medications   Medication Sig Start Date End Date Taking? Authorizing Provider  Calcium Carb-Cholecalciferol 600-800 MG-UNIT TABS TAKE ONE TABLET BY MOUTH 2 TIMES A DAY Patient taking differently: Take 1 tablet by mouth 2 (two) times daily. 09/11/15  Yes Rodolph Bong, MD  Cranberry 500 MG CAPS Take 500 mg by mouth daily.    Yes [provider]  dabigatran (PRADAXA) 75 MG CAPS capsule Take 1 capsule (75 mg total) by mouth every 12 (twelve) hours. 05/15/20  Yes Dorcas Carrow, MD  diltiazem (CARDIZEM CD) 180 MG 24 hr capsule Take 1 capsule (180 mg total) by mouth at bedtime. 05/24/17  Yes Rodolph Bong, MD  docusate sodium (COLACE) 100 MG capsule Take 100 mg by mouth 2 (two) times daily.   Yes [provider]  Ergocalciferol (VITAMIN D2) 2000 units TABS Take 2,000 Units by mouth daily.    Yes [provider]  ferrous sulfate 325 (65 FE) MG tablet Take 325 mg by mouth 2 (two) times daily.    Yes [provider]  FLUoxetine (PROZAC) 10 MG capsule Take 10 mg by mouth daily.   Yes [provider]  folic acid (FOLVITE) 1 MG tablet Take 1 tablet (1 mg total) by mouth daily. 05/24/17  Yes Rodolph Bong, MD  guaiFENesin 200 MG tablet Take 200 mg by mouth every 4 (four) hours as needed.   Yes [provider]  Infant Care Products [facility]) OINT Apply 1 application topically 2 (two) times daily.   Yes [provider]  levothyroxine (SYNTHROID) 75 MCG tablet Take 75 mcg by mouth daily before breakfast.   Yes [provider]  loperamide (IMODIUM A-D) 2 MG tablet Take 4 mg by mouth See admin instructions. Take 2 capsules (  totally) by mouth after first loose stool and  after subsequent stool; do not exceed  in 24hr   Yes [provider]  memantine (NAMENDA) 10 MG tablet Take 10  mg by mouth at bedtime.   Yes [provider]  metFORMIN (GLUCOPHAGE) 500 MG tablet Take 1 tablet (500 mg total) by mouth 2 (two) times daily with a meal. 05/15/20 05/15/21 Yes Ghimire, Lyndel Safe, MD  methenamine (MANDELAMINE) 0.5 GM tablet Take 1 tablet (500 mg total) by mouth 4 (four) times daily. Patient taking differently: Take 500 mg by mouth 2 (two) times daily. 05/21/20  Yes Dorcas Carrow, MD  methotrexate (RHEUMATREX) 10 MG tablet Take 20 mg  by mouth every Friday.   Yes [provider]  metoprolol succinate (TOPROL-XL) 25 MG 24 hr tablet Take 25 mg by mouth daily.   Yes [provider]  mirtazapine (REMERON) 15 MG tablet Take 7.5 mg by mouth at bedtime.   Yes [provider]  polyethylene glycol (MIRALAX / GLYCOLAX) 17 g packet Take 17 g by mouth daily.   Yes [provider]  pravastatin (PRAVACHOL) 40 MG tablet TAKE 1 TABLET (40 MG TOTAL) BY MOUTH DAILY. Patient taking differently: Take 40 mg by mouth at bedtime. 01/03/18  Yes Rodolph Bong, MD  PREMARIN vaginal cream PLACE 1 APPLICATORFUL VAGINALLY ONCE A DAY Patient taking differently: Place 0.5 g vaginally daily. 12/08/17  Yes Rodolph Bong, MD  Propylene Glycol (SYSTANE COMPLETE) 0.6 % SOLN Apply 1 drop to eye 4 (four) times daily.   Yes [provider]  Saccharomyces boulardii (PROBIOTIC) 250 MG CAPS Take 250 mg by mouth 2 (two) times daily. 04/06/20  Yes [provider]  Skin Protectants, Misc. (EUCERIN) cream Apply 1 application topically as needed for dry skin.   Yes [provider]  torsemide (DEMADEX) 100 MG tablet Take 0.5 tablets (50 mg total) by mouth 2 (two) times daily. 05/24/17  Yes Rodolph Bong, MD  vitamin B-12 (CYANOCOBALAMIN) 1000 MCG tablet Take 1,000 mcg by mouth daily.   Yes [provider]  benzonatate (TESSALON) 100 MG capsule Take 100 mg by mouth 3 (three) times daily. Patient not taking: Reported on 07/29/2020    [provider]   glucose blood test strip Once daily E11.29 03/03/15   Rodolph Bong, MD  HEMORRHOIDAL 0.25-14-74.9 % rectal ointment Place 1 application rectally 2 (two) times daily as needed. Patient not taking: Reported on 07/29/2020 05/25/20   [provider]    Allergies    Cefuroxime, Fish allergy, Ciprofloxacin, Codeine, Sertraline hcl, Cephalexin, and Paxil [paroxetine hcl]  Review of Systems   Review of Systems  Unable to perform ROS: Mental status change    Physical Exam Updated Vital Signs BP (!) 161/100   Pulse 71   Temp 98.6 F (37 C) (Oral)   Resp (!) 25   SpO2 92%   Physical Exam Vitals and nursing note reviewed.  Constitutional:      General: She is not in acute distress.    Appearance: She is well-developed. She is not ill-appearing, toxic-appearing or diaphoretic.  HENT:     Head: Normocephalic and atraumatic.  Eyes:     Conjunctiva/sclera: Conjunctivae normal.     Pupils: Pupils are equal, round, and reactive to light.  Cardiovascular:     Rate and Rhythm: Normal rate and regular rhythm.     Pulses: Normal pulses.     Heart sounds: Normal heart sounds. No murmur heard.   Pulmonary:     Effort: Pulmonary effort is normal. No respiratory distress.     Breath sounds: Normal breath sounds.  Abdominal:     Palpations: Abdomen is soft.     Tenderness: There is no abdominal tenderness.  Musculoskeletal:     Cervical back: Neck supple.     Comments: Smile equal without evidence of facial droop, no word slurring, no facial droop.  Cranial nerves are intact.  She does have residual left-sided weakness in the left arm and left leg, however this is reportedly chronic.  Sensations are intact globally.  She is alert and oriented x1.  Skin:    General: Skin is warm and dry.  Neurological:     General: No focal deficit present.     Mental Status: She is alert.     Cranial Nerves: No cranial nerve deficit.     Sensory: No sensory deficit.     Motor: Weakness present.   Psychiatric:        Mood and Affect: Mood normal.        Behavior: Behavior normal.     ED Results / Procedures / Treatments   Labs (all labs ordered are listed, but only abnormal results are displayed) Labs Reviewed  URINALYSIS, ROUTINE W REFLEX MICROSCOPIC - Abnormal; Notable for the following components:      Result Value   APPearance HAZY (*)    Hgb urine dipstick MODERATE (*)    Protein, ur 30 (*)    Leukocytes,Ua LARGE (*)    WBC, UA >50 (*)    Bacteria, UA FEW (*)    All other components within normal limits  BRAIN NATRIURETIC PEPTIDE - Abnormal; Notable for the following components:   B Natriuretic Peptide 454.7 (*)    All other components within normal limits  COMPREHENSIVE METABOLIC PANEL - Abnormal; Notable for the following components:   Glucose, Bld 173 (*)    BUN 38 (*)    Creatinine, Ser 1.38 (*)    Albumin 2.7 (*)    GFR, Estimated 37 (*)    All other components within normal limits  CBG MONITORING, ED - Abnormal; Notable for the following components:   Glucose-Capillary 172 (*)    All other components within normal limits  URINE CULTURE  CBC WITH DIFFERENTIAL/PLATELET  CBC WITH DIFFERENTIAL/PLATELET    EKG EKG Interpretation  Date/Time:  Wednesday July 29 2020 11:30:51 EDT Ventricular Rate:  73 PR Interval:    QRS Duration: 108 QT Interval:  428 QTC Calculation: 472 R Axis:   65 Text Interpretation: Atrial fibrillation Probable left ventricular hypertrophy Anterior Q waves, possibly due to LVH similar to previous Confirmed by Frederick Peers [phone]) on 07/29/2020 11:38:51 AM   Radiology CT Head Wo Contrast  Result Date: 07/29/2020 CLINICAL DATA:  Altered mental status EXAM: CT HEAD WITHOUT CONTRAST TECHNIQUE: Contiguous axial images were obtained from the base of the skull through the vertex without intravenous contrast. COMPARISON:  Head CT and MRI May 13, 2020 FINDINGS: Brain: No evidence of acute large vascular territory infarction,  hemorrhage, hydrocephalus, extra-axial collection or mass lesion/mass effect. Age related global parenchymal volume loss with ex vacuo dilatation of the ventricular system. Similar moderate burden of chronic ischemic small vessel white matter disease. Lacunar type infarcts in the bilateral basal ganglia and right thalamus. Vascular: No hyperdense vessel. Atherosclerotic calcifications of the intracranial portions of the internal carotid and vertebral arteries. Skull: Hyperostosis frontalis. Negative for fracture or focal lesion. Sinuses/Orbits: Mucosal thickening of the right maxillary sinus and ethmoid air cells with near complete opacification of the left maxillary sinus. Prior lens surgery. Other: None IMPRESSION: 1. No acute intracranial findings. 2. Age-related global parenchymal volume loss and chronic ischemic small vessel white matter disease. Lacunar type infarcts in the bilateral basal ganglia and right thalamus. 3. New paranasal sinus disease as can be seen with sinusitis. Electronically Signed   By: Maudry Mayhew MD   On: 07/29/2020 13:19   DG Chest Portable 1 View  Result Date: 07/29/2020 CLINICAL DATA:  Altered mental status. EXAM: PORTABLE CHEST 1 VIEW COMPARISON:  May 13, 2020. FINDINGS: Stable cardiomegaly. Status post coronary bypass graft. No pneumothorax or pleural effusion is noted. No  acute pulmonary disease is noted. Bony thorax is unremarkable. IMPRESSION: No active disease. Electronically Signed   By: Lupita Raider M.D.   On: 07/29/2020 12:24    Procedures Procedures   Medications Ordered in ED Medications - No data to display  ED Course  I have reviewed the triage vital signs and the nursing notes.  Pertinent labs & imaging results that were available during my care of the patient were reviewed by me and considered in my medical decision making (see chart for details).    MDM Rules/Calculators/A&P                          87 year old female with altered mental  status, questionable seizure-like activity On arrival, she has no focal neurodeficits, she is alert and oriented x1 which is apparently not normal for her.  Her blood pressure was elevated a little bit at 176/60, other vitals reassuring.  She has some residual left-sided weakness from her prior stroke, but no other acute findings on physical exam.  Labs and imaging ordered, reviewed and interpreted by me.  CMP with a creatinine of 1.38, elevated from baseline.  No evidence of AKI.  Her BNP is elevated 454 does appear to be more or less at baseline.  CBG of 172.  UA with moderate hemoglobin, large leukocytes, more than 50 WBCs and few bacteria, though this does appear to be at baseline.  She does suffer from frequent UTIs.  Chest x-ray largely unremarkable, CT of the head with chronic ischemic changes but nothing acute.  Her EKG is consistent with atrial fibrillation, no evidence of RVR  Spoke with Dr. Iver Nestle with neurology.  She will evaluate the patient and give her recommendations for further management.  Signed out care to Ardelle Park will oversee the rest of her care.  Dispo according to neurology's recommendation.  This was a shared visit with my supervising physician Dr. Clarene Duke who independently saw and evaluated the patient & provided guidance in evaluation/management/disposition ,in agreement with care   Final Clinical Impression(s) / ED Diagnoses Final diagnoses:  None    Rx / DC Orders ED Discharge Orders    None       Leone Brand 07/29/20 1535    Little, Ambrose Finland, MD 07/31/20 1457

## 2020-07-29 NOTE — H&P (Signed)
History and Physical    [name]:[id number] DOB: 1930-04-07 DOA: 07/29/2020  PCP: [name], MD Patient coming from: [facility]  Chief Complaint: Altered mental status  HPI: [name] is a 87 y.o. female with medical history significant of vascular dementia, hypothyroidism, CKD stage IIIb, hypertension, hyperlipidemia, type 2 diabetes, chronic A. fib on anticoagulation, CAD status post CABG, CHF, history of DVT, GERD, PVD, rheumatoid arthritis on methotrexate, recurrent UTIs on methenamine, acute CVA in February 2022 presented to the ED via EMS from her nursing home for evaluation of altered mental status.  It is reported that she was in her normal state of health at 9 AM this morning and at 10 AM they noticed that her facility that she had drawn both of her arms up to her chest and had a tense muscle tone which lasted about 5 minutes without convulsive activity and she was altered after that episode.  On arrival to ED, patient did not have any focal neurodeficits and was AAO x1.  Blood pressure elevated with systolic in the 170s, remainder of vital signs stable.  Labs showing WBC 7.9, hemoglobin 10.7 (at baseline), platelet count 219K. Sodium 140, potassium 4.1, chloride 103, bicarb 31, BUN 38, creatinine 1.3 (no significant change from baseline), glucose 173.  BNP 454.  UA with negative nitrite, large amount of leukocytes, 11-20 RBCs, greater than 50 WBCs, and few bacteria.  Urine culture pending.  SARS-CoV-2 PCR test pending.  Head CT negative for acute intracranial abnormality.  Chest x-ray showing no active disease. Patient was given Bactrim. Neurology saw the patient and felt that her presentation could be due to potential stroke versus first-time seizure.  They recommended obtaining EEG as well as brain MRI.  Recommended holding antiepileptic medication until after EEG is completed.  Recommended holding Pradaxa until MRI is done.  Blood pressure goal over 170 for  now, if MRI is negative for stroke, then goal is normotensive.  Patient is currently resting comfortably.  History provided by her limited.  Reports frontal headaches and burning with urination.  No other complaints.  Daughter states patient had cold symptoms and sinus infection about a week or 2 ago for which she was given a cough medication but daughter does not believe that she was treated with antibiotics.  States today she was told by the patient's facility that she had become unresponsive and had drawn both of her arms up to her chest.  Review of Systems:  All systems reviewed and apart from history of presenting illness, are negative.  Past Medical History:  Diagnosis Date  . Acute pancreatitis   . Anemia   . Arthritis    RA  . Atrial fibrillation (HCC)   . CAD (coronary artery disease)   . Carotid artery occlusion   . CHF (congestive heart failure) (HCC)   . Diabetes mellitus age 87  . DJD (degenerative joint disease)   . DVT (deep venous thrombosis) (HCC)   . GERD (gastroesophageal reflux disease)   . GI bleed   . Hyperlipidemia   . Hypertension   . Joint pain   . Mesenteric ischemia   . Muscle weakness (generalized)   . Peripheral vascular disease (HCC)   . Renal disorder    Stage IV  . Thyroid disease   . Vascular dementia [facility])     Past Surgical History:  Procedure Laterality Date  . APPENDECTOMY    . CELIAC ARTERY STENT    . CORONARY ARTERY BYPASS GRAFT    .  EMBOLECTOMY  2009   right leg  . FASCIOTOMY     right leg  . HIP FRACTURE SURGERY Left   . JOINT REPLACEMENT    . TOTAL KNEE ARTHROPLASTY     bilateral  . TUBAL LIGATION    . VISCERAL ANGIOGRAM N/A 05/31/2013   Procedure: MESSENTERIC [name];  Surgeon: [name], MD;  Location: [facility] CATH LAB;  Service: Cardiovascular;  Laterality: N/A;     reports that she has never smoked. She has never used smokeless tobacco. She reports that she does not drink alcohol and does not use drugs.  Allergies   Allergen Reactions  . Cefuroxime Anaphylaxis and Other (See Comments)    Throat swelling - MAR  . Fish Allergy Shortness Of Breath    Pt said she has throat swelling   . Ciprofloxacin Rash    Per MAR  . Codeine Other (See Comments)    Jittery - Per MAR  . Sertraline Hcl Rash    Per MAR  . Cephalexin Rash    Per MAR  . Paxil [Paroxetine Hcl] Other (See Comments)    Delirium while in hospital reports of hallucinations.    Family History  Problem Relation Age of Onset  . Cancer Mother        ovarian  . Other Father        suicide  . Coronary artery disease Sister   . Other Sister        alzheimers  . Heart disease Sister        Before age 39  . Diabetes Sister   . Cancer Daughter        spinal tumor  . Diabetes Daughter   . Hypertension Daughter   . Diabetes Son   . Hyperlipidemia Son   . Hypertension Son     Prior to Admission medications   Medication Sig Start Date End Date Taking? Authorizing Provider  Calcium Carb-Cholecalciferol 600-800 MG-UNIT TABS TAKE ONE TABLET BY MOUTH 2 TIMES A DAY Patient taking differently: Take 1 tablet by mouth 2 (two) times daily. 09/11/15  Yes Rodolph Bong, MD  Cranberry 500 MG CAPS Take 500 mg by mouth daily.    Yes [provider]  dabigatran (PRADAXA) 75 MG CAPS capsule Take 1 capsule (75 mg total) by mouth every 12 (twelve) hours. 05/15/20  Yes Dorcas Carrow, MD  diltiazem (CARDIZEM CD) 180 MG 24 hr capsule Take 1 capsule (180 mg total) by mouth at bedtime. 05/24/17  Yes Rodolph Bong, MD  docusate sodium (COLACE) 100 MG capsule Take 100 mg by mouth 2 (two) times daily.   Yes [provider]  Ergocalciferol (VITAMIN D2) 2000 units TABS Take 2,000 Units by mouth daily.    Yes [provider]  ferrous sulfate 325 (65 FE) MG tablet Take 325 mg by mouth 2 (two) times daily.    Yes [provider]  FLUoxetine (PROZAC) 10 MG capsule Take 10 mg by mouth daily.   Yes [provider]  folic acid  (FOLVITE) 1 MG tablet Take 1 tablet (1 mg total) by mouth daily. 05/24/17  Yes Rodolph Bong, MD  guaiFENesin 200 MG tablet Take 200 mg by mouth every 4 (four) hours as needed.   Yes [provider]  [employer]) OINT Apply 1 application topically 2 (two) times daily.   Yes [provider]  levothyroxine (SYNTHROID) 75 MCG tablet Take 75 mcg by mouth daily before breakfast.   Yes [provider]  loperamide (IMODIUM A-D) 2 MG tablet Take 4 mg by mouth See admin instructions. Take 2 capsules (4mg  totally) by mouth after first loose stool and 2mg  after subsequent stool; do not exceed 16mg  in 24hr   Yes [provider]  memantine (NAMENDA) 10 MG tablet Take 10 mg by mouth at bedtime.   Yes [provider]  metFORMIN (GLUCOPHAGE) 500 MG tablet Take 1 tablet (500 mg total) by mouth 2 (two) times daily with a meal. 05/15/20 05/15/21 Yes Ghimire, , MD  methenamine (MANDELAMINE) 0.5 GM tablet Take 1 tablet (500 mg total) by mouth 4 (four) times daily. Patient taking differently: Take 500 mg by mouth 2 (two) times daily. 05/21/20  Yes 07/13/21, MD  methotrexate (RHEUMATREX) 10 MG tablet Take 20 mg by mouth every Friday.   Yes [provider]  metoprolol succinate (TOPROL-XL) 25 MG 24 hr tablet Take 25 mg by mouth daily.   Yes [provider]  mirtazapine (REMERON) 15 MG tablet Take 7.5 mg by mouth at bedtime.   Yes [provider]  polyethylene glycol (MIRALAX / GLYCOLAX) 17 g packet Take 17 g by mouth daily.   Yes [provider]  pravastatin (PRAVACHOL) 40 MG tablet TAKE 1 TABLET (40 MG TOTAL) BY MOUTH DAILY. Patient taking differently: Take 40 mg by mouth at bedtime. 01/03/18  Yes Dorcas Carrow, MD  PREMARIN vaginal cream PLACE 1 APPLICATORFUL VAGINALLY ONCE A DAY Patient taking differently: Place 0.5 g vaginally daily. 12/08/17  Yes 01/05/18, MD  Propylene Glycol (SYSTANE COMPLETE) 0.6 % SOLN  Apply 1 drop to eye 4 (four) times daily.   Yes [provider]  Saccharomyces boulardii (PROBIOTIC) 250 MG CAPS Take 250 mg by mouth 2 (two) times daily. 04/06/20  Yes [provider]  Skin Protectants, Misc. (EUCERIN) cream Apply 1 application topically as needed for dry skin.   Yes [provider]  torsemide (DEMADEX) 100 MG tablet Take 0.5 tablets (50 mg total) by mouth 2 (two) times daily. 05/24/17  Yes Rodolph Bong, MD  vitamin B-12 (CYANOCOBALAMIN) 1000 MCG tablet Take 1,000 mcg by mouth daily.   Yes [provider]  benzonatate (TESSALON) 100 MG capsule Take 100 mg by mouth 3 (three) times daily. Patient not taking: Reported on 07/29/2020    [provider]  glucose blood test strip Once daily E11.29 03/03/15   07/31/2020, MD  HEMORRHOIDAL 0.25-14-74.9 % rectal ointment Place 1 application rectally 2 (two) times daily as needed. Patient not taking: Reported on 07/29/2020 05/25/20   [provider]    Physical Exam: Vitals:   07/29/20 1415 07/29/20 1430 07/29/20 1445 07/29/20 1515  BP: (!) 172/68 (!) 177/73 (!) 159/75 (!) 161/100  Pulse: (!) 49 69 69 71  Resp: (!) 22 19 (!) 22 (!) 25  Temp:      TempSrc:      SpO2: 98% 97% 96% 92%    Physical Exam Constitutional:      General: She is not in acute distress. HENT:     Head: Normocephalic and atraumatic.  Eyes:     Extraocular Movements: Extraocular movements intact.     Conjunctiva/sclera: Conjunctivae normal.  Cardiovascular:     Rate and Rhythm: Normal rate and regular rhythm.     Pulses: Normal pulses.  Pulmonary:     Effort: Pulmonary effort is normal. No respiratory distress.     Breath sounds: Normal breath sounds. No wheezing or rales.  Abdominal:     General: Bowel sounds are normal. There is no distension.     Palpations: Abdomen is soft.     Tenderness: There is no abdominal tenderness.  Musculoskeletal:        General: No swelling or tenderness.      Cervical back: Normal range of motion and neck supple.  Skin:    General: Skin is warm and dry.  Neurological:     Mental Status: She is alert.     Cranial Nerves: No cranial nerve deficit.     Sensory: No sensory deficit.     Motor: No weakness.     Comments: Awake and alert Moving all extremities on command     Labs on Admission: I have personally reviewed following labs and imaging studies  CBC: Recent Labs  Lab 07/29/20 1431  WBC 7.9  NEUTROABS 4.2  HGB 10.7*  HCT 33.7*  MCV 101.8*  PLT 219   Basic Metabolic Panel: Recent Labs  Lab 07/29/20 1148  NA 140  K 4.1  CL 103  CO2 31  GLUCOSE 173*  BUN 38*  CREATININE 1.38*  CALCIUM 9.2   GFR: CrCl cannot be calculated (Unknown ideal weight.). Liver Function Tests: Recent Labs  Lab 07/29/20 1148  AST 18  ALT 13  ALKPHOS 65  BILITOT 0.4  PROT 7.4  ALBUMIN 2.7*   No results for input(s): LIPASE, AMYLASE in the last 168 hours. No results for input(s): AMMONIA in the last 168 hours. Coagulation Profile: No results for input(s): INR, PROTIME in the last 168 hours. Cardiac Enzymes: No results for input(s): CKTOTAL, CKMB, CKMBINDEX, TROPONINI in the last 168 hours. BNP (last 3 results) No results for input(s): PROBNP in the last 8760 hours. HbA1C: No results for input(s): HGBA1C in the last 72 hours. CBG: Recent Labs  Lab 07/29/20 1136  GLUCAP 172*   Lipid Profile: No results for input(s): CHOL, HDL, LDLCALC, TRIG, CHOLHDL, LDLDIRECT in the last 72 hours. Thyroid Function Tests: No results for input(s): TSH, T4TOTAL, FREET4, T3FREE, THYROIDAB in the last 72 hours. Anemia Panel: No results for input(s): VITAMINB12, FOLATE, FERRITIN, TIBC, IRON, RETICCTPCT in the last 72 hours. Urine analysis:    Component Value Date/Time   COLORURINE YELLOW 07/29/2020 1306   APPEARANCEUR HAZY (A) 07/29/2020 1306   LABSPEC 1.012 07/29/2020 1306   PHURINE 6.0 07/29/2020 1306   GLUCOSEU NEGATIVE 07/29/2020 1306    GLUCOSEU NEG mg/dL 16/01/9603 5409   HGBUR MODERATE (A) 07/29/2020 1306   BILIRUBINUR NEGATIVE 07/29/2020 1306   BILIRUBINUR NEGATIUVE 01/15/2018 1428   KETONESUR NEGATIVE 07/29/2020 1306   PROTEINUR 30 (A) 07/29/2020 1306   UROBILINOGEN 1.0 01/15/2018 1428   UROBILINOGEN 0.2 03/07/2010 1517   NITRITE NEGATIVE 07/29/2020 1306   LEUKOCYTESUR LARGE (A) 07/29/2020 1306    Radiological Exams on Admission: CT Head Wo Contrast  Result Date: 07/29/2020 CLINICAL DATA:  Altered mental status EXAM: CT HEAD WITHOUT CONTRAST TECHNIQUE: Contiguous axial images were obtained from the base of the skull through the vertex without intravenous contrast. COMPARISON:  Head CT and MRI May 13, 2020 FINDINGS: Brain: No evidence of acute large vascular territory infarction, hemorrhage, hydrocephalus, extra-axial collection or mass lesion/mass effect. Age related global parenchymal volume loss with ex vacuo dilatation of the ventricular system. Similar moderate burden of chronic ischemic small vessel white matter disease. Lacunar type infarcts in the bilateral basal ganglia and right thalamus. Vascular: No hyperdense vessel. Atherosclerotic calcifications of the intracranial portions of the internal carotid and vertebral  arteries. Skull: Hyperostosis frontalis. Negative for fracture or focal lesion. Sinuses/Orbits: Mucosal thickening of the right maxillary sinus and ethmoid air cells with near complete opacification of the left maxillary sinus. Prior lens surgery. Other: None IMPRESSION: 1. No acute intracranial findings. 2. Age-related global parenchymal volume loss and chronic ischemic small vessel white matter disease. Lacunar type infarcts in the bilateral basal ganglia and right thalamus. 3. New paranasal sinus disease as can be seen with sinusitis. Electronically Signed   By: Maudry Mayhew MD   On: 07/29/2020 13:19   DG Chest Portable 1 View  Result Date: 07/29/2020 CLINICAL DATA:  Altered mental status. EXAM:  PORTABLE CHEST 1 VIEW COMPARISON:  May 13, 2020. FINDINGS: Stable cardiomegaly. Status post coronary bypass graft. No pneumothorax or pleural effusion is noted. No acute pulmonary disease is noted. Bony thorax is unremarkable. IMPRESSION: No active disease. Electronically Signed   By: Lupita Raider M.D.   On: 07/29/2020 12:24    EKG: Independently reviewed.  A. fib, rate controlled.  Assessment/Plan Principal Problem:   AMS (altered mental status) Active Problems:   Hypothyroidism   ATRIAL FIBRILLATION   Rheumatoid arthritis (HCC)   Diabetes mellitus with renal complications (HCC)   Frequent UTI   Hyperlipidemia   Vascular dementia, uncomplicated (HCC)   Sinusitis   Altered mental status  It is reported that she was in her normal state of health at 9 AM this morning and at 10 AM they noticed that her facility that she had drawn both of her arms up to her chest and had a tense muscle tone which lasted about 5 minutes without convulsive activity and she was altered after that episode.  Head CT negative for acute intracranial abnormality.  Patient is currently awake and alert.  Neurology felt that her presentation could be due to potential stroke versus first-time seizure. -Brain MRI pending.  Neurology recommending holding off starting antiepileptic medication at this time, EEG was done and results are pending.  Also recommending holding Pradaxa until MRI is done.  Recommending blood pressure goal over 170 for now, if MRI is negative for stroke, then goal is normotensive.  Continue pravastatin.  Seizure precautions, fall precautions, frequent neurochecks.  Further work-up depending on results of brain MRI and EEG.  Possible UTI Patient with history of recurrent UTIs on methenamine.  She is endorsing dysuria. UA with negative nitrite, large amount of leukocytes, 11-20 RBCs, greater than 50 WBCs, and few bacteria.  No fever, leukocytosis, or signs of sepsis.  Prior urine culture from  February growing Proteus and E. coli sensitive to Bactrim.  She is allergic to cephalosporins and fluoroquinolones. -Continue Bactrim. Urine culture pending.   Sinusitis Patient is endorsing frontal headaches.  CT showing mucosal thickening of the right maxillary sinus and ethmoid air cells with near complete opacification of the left maxillary sinus. -Start doxycycline  Hypothyroidism -Continue home Synthroid  Dementia -Continue home meds  Hyperlipidemia -Continue pravastatin  Type 2 diabetes A1c 7.6 on 05/14/2020. -Hold home metformin.  Order sliding scale insulin sensitive ACHS.  Chronic A. Fib Currently rate controlled. -Neurology recommending holding Pradaxa until brain MRI scan.  Hold Cardizem and metoprolol at this time to allow permissive hypertension.  Rheumatoid arthritis -Continue methotrexate  CHF BNP elevated at 454 but chest x-ray clear.  She has no peripheral edema.  Echo done in February 2022 showing normal LVEF of 55 to 60% and diastolic parameters indeterminate. -Hold home torsemide at this time to allow permissive hypertension.  DVT prophylaxis:  Neurology recommending holding Pradaxa until brain MRI is done. Code Status: DNR.  Signed form at bedside and confirmed with the patient's daughter. Family Communication: Daughter at bedside. Disposition Plan: Status is: Observation  The patient remains OBS appropriate and will d/c before 2 midnights.  Dispo: The patient is from: SNF              Anticipated d/c is to: SNF              Patient currently is not medically stable to d/c.   Difficult to place patient No  Consults called: Neurology Level of care: Level of care: Telemetry Medical   The medical decision making on this patient was of high complexity and the patient is at high risk for clinical deterioration, therefore this is a level 3 visit.  John Giovanni MD [facility]  If 7PM-7AM, please contact  night-coverage [email or web address]  07/29/2020, 9:04 PM

## 2020-07-29 NOTE — ED Notes (Signed)
Pt eating a sandwich  With her daughter at the bedside no distress

## 2020-07-29 NOTE — Progress Notes (Signed)
EEG completed, results pending. 

## 2020-07-29 NOTE — ED Notes (Signed)
THE PT IS GETTING AN EEG

## 2020-07-29 NOTE — ED Notes (Signed)
Patient transported to MRI 

## 2020-07-29 NOTE — Consult Note (Signed)
Neurology Consultation  Reason for Consult: ? seizure Referring Physician: Gardner Candle.,  MD  CC: possible seizure activity and AMS  History is obtained from: daughter at bedside, chart  HPI: [name] is a 87 y.o. female with a PMHx of vascular dementia, UTIs, RA, remote CVA/TIA, hypothyroidism,CAD, GIB, PVD, CKD IV, HTN, HLD, DM II, and atrial fibrillation. Patient lives at [facility]. She was last seen normal at 0900 hrs today. Per daughter, staff found patient somewhat unresponsive (in and out) at 1000 hrs today. During assessment, patient tensed up and drew her UEs to her chest for approximately 5 minutes. There were no convulsive actions. Per daughter, the patient has been more sleepy even during the daytime for about 3-4 weeks. Daughter states she was planning on asking facility NP if some of the patient's medications could be discontinued. Per daughter, patient does not take any Benzos or Opioids.   Daughter states that patient makes sense at times but not at other times. She has very poor short term memory, but no behavioral disturbances. Meals are per facility. Patient no longer handles any financial matters. No personal of FMHx of seizures.  At baseline she does ambulate with a walker but requires help with all of her ADLs  In review of chart patient was noted to have a CVA from chart in February 2022. Her Eliquis 2.5 mg BID was changed to Pradaxa at that time, due to concern for inefficacy of the low dose Eliquis.  Acute infarct in the cortex and subcortical white matter of the posterior left frontal lobe (MCA territory and MCA/ACA watershed territory). Also, remote lacunar infarcts.   Workup in ED: creat 1.38 (was 1.16 in 2/22), BNP 454.7, glucose 173, UA with large leukocytes, negative nitrite. Urine culture pending. CTH showed no acute findings, but age related global parenchymal volume loss and chronic ischemic small vessel white matter disease and Lacunar infarcts in bilateral  basal ganglia and right thalamus.   Because of possible seizure activity, neurology is asked to consult.   LKW: 0900 hrs  ROS: Unable to obtain robust ROS due to dementia. Rest per HPI.   Past Medical History:  Diagnosis Date  . Acute pancreatitis   . Anemia   . Arthritis    RA  . Atrial fibrillation (HCC)   . CAD (coronary artery disease)   . Carotid artery occlusion   . CHF (congestive heart failure) (HCC)   . Diabetes mellitus age 45  . DJD (degenerative joint disease)   . DVT (deep venous thrombosis) (HCC)   . GERD (gastroesophageal reflux disease)   . GI bleed   . Hyperlipidemia   . Hypertension   . Joint pain   . Mesenteric ischemia   . Muscle weakness (generalized)   . Peripheral vascular disease (HCC)   . Renal disorder    Stage IV  . Thyroid disease   . Vascular dementia (HCC)     Family History  Problem Relation Age of Onset  . Cancer Mother        ovarian  . Other Father        suicide  . Coronary artery disease Sister   . Other Sister        alzheimers  . Heart disease Sister        Before age 35  . Diabetes Sister   . Cancer Daughter        spinal tumor  . Diabetes Daughter   . Hypertension Daughter   . Diabetes Son   .  Hyperlipidemia Son   . Hypertension Son     Social History:   reports that she has never smoked. She has never used smokeless tobacco. She reports that she does not drink alcohol and does not use drugs.  Medications No current facility-administered medications for this encounter.  Current Outpatient Medications:  .  Calcium Carb-Cholecalciferol 600-800 MG-UNIT TABS, TAKE ONE TABLET BY MOUTH 2 TIMES A DAY (Patient taking differently: Take 1 tablet by mouth 2 (two) times daily.), Disp: 60 tablet, Rfl: 6 .  Cranberry 500 MG CAPS, Take 500 mg by mouth daily. , Disp: , Rfl:  .  dabigatran (PRADAXA) 75 MG CAPS capsule, Take 1 capsule (75 mg total) by mouth every 12 (twelve) hours., Disp: 60 capsule, Rfl:  .  diltiazem (CARDIZEM  CD) 180 MG 24 hr capsule, Take 1 capsule (180 mg total) by mouth at bedtime., Disp: 90 capsule, Rfl: 3 .  docusate sodium (COLACE) 100 MG capsule, Take 100 mg by mouth 2 (two) times daily., Disp: , Rfl:  .  Ergocalciferol (VITAMIN D2) 2000 units TABS, Take 2,000 Units by mouth daily. , Disp: , Rfl:  .  ferrous sulfate 325 (65 FE) MG tablet, Take 325 mg by mouth 2 (two) times daily. , Disp: , Rfl:  .  FLUoxetine (PROZAC) 10 MG capsule, Take 10 mg by mouth daily., Disp: , Rfl:  .  folic acid (FOLVITE) 1 MG tablet, Take 1 tablet (1 mg total) by mouth daily., Disp: 90 tablet, Rfl: 3 .  guaiFENesin 200 MG tablet, Take 200 mg by mouth every 4 (four) hours as needed., Disp: , Rfl:  .  Infant Care Products (DERMACLOUD) OINT, Apply 1 application topically 2 (two) times daily., Disp: , Rfl:  .  levothyroxine (SYNTHROID) 75 MCG tablet, Take 75 mcg by mouth daily before breakfast., Disp: , Rfl:  .  loperamide (IMODIUM A-D) 2 MG tablet, Take 4 mg by mouth See admin instructions. Take 2 capsules (4mg  totally) by mouth after first loose stool and 2mg  after subsequent stool; do not exceed 16mg  in 24hr, Disp: , Rfl:  .  memantine (NAMENDA) 10 MG tablet, Take 10 mg by mouth at bedtime., Disp: , Rfl:  .  metFORMIN (GLUCOPHAGE) 500 MG tablet, Take 1 tablet (500 mg total) by mouth 2 (two) times daily with a meal., Disp: 60 tablet, Rfl: 11 .  methenamine (MANDELAMINE) 0.5 GM tablet, Take 1 tablet (500 mg total) by mouth 4 (four) times daily. (Patient taking differently: Take 500 mg by mouth 2 (two) times daily.), Disp: , Rfl:  .  methotrexate (RHEUMATREX) 10 MG tablet, Take 20 mg by mouth every Friday., Disp: , Rfl:  .  metoprolol succinate (TOPROL-XL) 25 MG 24 hr tablet, Take 25 mg by mouth daily., Disp: , Rfl:  .  mirtazapine (REMERON) 15 MG tablet, Take 7.5 mg by mouth at bedtime., Disp: , Rfl:  .  polyethylene glycol (MIRALAX / GLYCOLAX) 17 g packet, Take 17 g by mouth daily., Disp: , Rfl:  .  pravastatin (PRAVACHOL)  40 MG tablet, TAKE 1 TABLET (40 MG TOTAL) BY MOUTH DAILY. (Patient taking differently: Take 40 mg by mouth at bedtime.), Disp: 90 tablet, Rfl: 1 .  PREMARIN vaginal cream, PLACE 1 APPLICATORFUL VAGINALLY ONCE A DAY (Patient taking differently: Place 0.5 g vaginally daily.), Disp: 30 g, Rfl: 11 .  Propylene Glycol (SYSTANE COMPLETE) 0.6 % SOLN, Apply 1 drop to eye 4 (four) times daily., Disp: , Rfl:  .  Saccharomyces boulardii (PROBIOTIC) 250 MG  CAPS, Take 250 mg by mouth 2 (two) times daily., Disp: , Rfl:  .  Skin Protectants, Misc. (EUCERIN) cream, Apply 1 application topically as needed for dry skin., Disp: , Rfl:  .  torsemide (DEMADEX) 100 MG tablet, Take 0.5 tablets (50 mg total) by mouth 2 (two) times daily., Disp: 180 tablet, Rfl: 1 .  vitamin B-12 (CYANOCOBALAMIN) 1000 MCG tablet, Take 1,000 mcg by mouth daily., Disp: , Rfl:  .  benzonatate (TESSALON) 100 MG capsule, Take 100 mg by mouth 3 (three) times daily. (Patient not taking: Reported on 07/29/2020), Disp: , Rfl:  .  glucose blood test strip, Once daily E11.29, Disp: 100 each, Rfl: 12 .  HEMORRHOIDAL 0.25-14-74.9 % rectal ointment, Place 1 application rectally 2 (two) times daily as needed. (Patient not taking: Reported on 07/29/2020), Disp: , Rfl:   Exam: Current vital signs: BP (!) 161/100   Pulse 71   Temp 98.6 F (37 C) (Oral)   Resp (!) 25   SpO2 92%  Vital signs in last 24 hours: Temp:  [98.6 F (37 C)] 98.6 F (37 C) (04/20 1133) Pulse Rate:  [49-73] 71 (04/20 1515) Resp:  [16-25] 25 (04/20 1515) BP: (148-177)/(60-100) 161/100 (04/20 1515) SpO2:  [92 %-98 %] 92 % (04/20 1515)  GENERAL: Awake, alert in NAD HEENT: - Normocephalic and atraumatic, moist mucous membranes, chronic discoloration of her right forehead LUNGS - Normal respiratory effort. CV -AF on tele, but rate controlled ABDOMEN - Soft, nontender Ext: warm, well perfused Musculoskeletal: Rheumatoid arthritis changes most notable in the bilateral  hands Psych: Light affect, confused, cooperative and pleasant  NEURO:  Mental Status: alert and oriented to self and daughter, hospital but thinks it is in [place], [place]. Not oriented to day, date, month, or year (baseline).  Speech/Language: speech is without dysarthria or aphasia. Naming, fluency, and comprehension intact.   Cranial Nerves:  II: PERRL 57mm/brisk. visual fields full.  III, IV, VI: EOMI. Lid elevation symmetric and full.  V: sensation is intact and symmetrical to face.  VII: Smile is symmetrical. Able to raise eyebrows.  VIII:hearing intact to voice IX, X: palate elevation is symmetric. Phonation normal.  XI: normal sternocleidomastoid and trapezius muscle strength LSL:HTDSKA is symmetrical without fasciculations.   Motor: Her strength is 3+/5 bilateral grips. 4/5 bilateral triceps and biceps. Plantar and dorsiflexion is 5/5 bilaterally. She has difficulty lifting LEs off of bed, but makes effort against gravity  Tone is normal. Bulk is normal.  Sensation- Intact to light touch bilaterally in all four extremities. Extinction absent to light touch to DSS.  Coordination: FTN intact bilaterally. No pronator drift.  Plantars downgoing.  Gait- deferred  Labs I have reviewed labs in epic and the results pertinent to this consultation are: WBCC 7.9K    Creat 1.38 (baseline 1.16 2 months ago)    Glucose 173    proBNP 954 Labs from Feb 2022 stroke workup: LDL 57 on 05/14/20, TSH 1.702 2/22, ad A1c 7.6 on 2/22  CBC    Component Value Date/Time   WBC 8.9 05/15/2020 0436   RBC 3.57 (L) 05/15/2020 0436   HGB 10.9 (L) 05/15/2020 0436   HCT 34.9 (L) 05/15/2020 0436   PLT 287 05/15/2020 0436   MCV 97.8 05/15/2020 0436   MCV 79 05/19/2014 0000   MCH 30.5 05/15/2020 0436   MCHC 31.2 05/15/2020 0436   RDW 15.9 (H) 05/15/2020 0436   LYMPHSABS 2.4 05/15/2020 0436   MONOABS 0.9 05/15/2020 0436   EOSABS 0.3 05/15/2020  0436   BASOSABS 0.1 05/15/2020 0436    CMP     Component  Value Date/Time   NA 140 07/29/2020 1148   NA 138 12/12/2017 0000   K 4.1 07/29/2020 1148   CL 103 07/29/2020 1148   CO2 31 07/29/2020 1148   GLUCOSE 173 (H) 07/29/2020 1148   BUN 38 (H) 07/29/2020 1148   BUN 21 12/12/2017 0000   CREATININE 1.38 (H) 07/29/2020 1148   CREATININE 1.49 (H) 09/21/2017 1549   CALCIUM 9.2 07/29/2020 1148   PROT 7.4 07/29/2020 1148   ALBUMIN 2.7 (L) 07/29/2020 1148   ALBUMIN 3.8 02/13/2015 0000   AST 18 07/29/2020 1148   ALT 13 07/29/2020 1148   ALKPHOS 65 07/29/2020 1148   BILITOT 0.4 07/29/2020 1148   GFRNONAA 37 (L) 07/29/2020 1148   GFRNONAA 32 (L) 09/21/2017 1549   GFRAA 57 (L) 05/19/2019 1734   GFRAA 37 (L) 09/21/2017 1549   Lipid Panel     Component Value Date/Time   CHOL 99 05/14/2020 0337   TRIG 74 05/14/2020 0337   HDL 27 (L) 05/14/2020 0337   CHOLHDL 3.7 05/14/2020 0337   VLDL 15 05/14/2020 0337   LDLCALC 57 05/14/2020 0337   Imaging MD reviewed the images obtained  CT head 1. No acute intracranial findings. 2. Age-related global parenchymal volume loss and chronic ischemic small vessel white matter disease. Lacunar type infarcts in the bilateral basal ganglia and right thalamus.  MRI brain is pending EEG is pending  Assessment: 87 yo female who presented today after being found by ALF staff in and out of responsiveness with an episode of tensing of extremities with cortical posturing lasting 5 mins. No convulsive activity. She was here in Feb 2022 for a CVA left MCA territory and watershed [employer]. She has known lacunar infarcts which are chronic. No LVO on MRA in Feb 2022. She had a complete stroke workup at that time. Her risk factors for stroke are previous stroke, HTN, DM, and HLD. Her Eliquis was changed to Pradaxa in February due to concern for efficacy of low dose Eliquis. No personal or FMHx of seizures, but is possible given her history of stroke as well as dementia. There was no temporal lobe changes on her last MRI. Will  recheck MRI brain and get an EEG today.   Seizures can also cause hemodynamic instability and cerebrovascular stress that can contribute to strokes in older patients, so there is a possibility that prior events as well as her waxing and waning mental status recently may be related to intermittent seizures though the latter may just be progression of her dementia  Impression: Unresponsiveness/waxing and waning with 5 mins of limb tenseness and cortical posturing. Stroke vs seizure.   Plan: -medicine admit -No need to r/p TSH, lipid panel, or HbA1c since done in Feb 2022.  -MRI brain without contrast.  -Routine EEG, will hold antiepileptic medication until after EEG is completed to increase diagnostic yield -Hold Pradaxa pending MRI.  -continue Pravachol at 40mg  po qd.  -BP goal normotension -tight blood sugar control.  -neuro checks q4 hours.  -seizure precautions. -fall precautions.  -further workup depends on results of MRI brain and EEG.  -Standard seizure precautions reviewed with daughter who confirmed that the patient has full level of supervision already and does not drive  Pt seen by Jimmye Norman, NP/Neuro and later by MD. Note/plan to be edited by MD as needed.  Pager: [phone]  Attending Neurologist's note:  I personally saw  this patient, gathering history, performing a full neurologic examination, reviewing relevant labs, personally reviewing relevant imaging including Head CT, prior MRI brain, and formulated the assessment and plan, adding the note above for completeness and clarity to accurately reflect my thoughts   Brooke Dare MD-PhD [facility] [phone]

## 2020-07-29 NOTE — ED Triage Notes (Signed)
Pt via EMS from [facility] for eval of AMS. Was seen at 0900 this morning, totally normal (A/O x  Per facility) and then was observed at 1000 to draw both of her arms up to her chest, have tense muscle tone, which lasted for five mins without any convulsing, and then was altered after that episode. A/O x 1 only. Pt c/o feeling tired. Hx dementia, stroke-left sided paralysis from same.

## 2020-07-29 NOTE — ED Provider Notes (Signed)
Received signout from previous provider, please see her note for complete H&P.  In short this is an 87 year old female who presents today when she was found by assisted living facility staff that she had an episode of change in mental status as well as tensing of her extremity with cortical posturing that lasted for approximately 5 minutes.  She has known history of prior stroke.  She is currently on Pradaxa.  Neurology has seen and evaluated patient for potential stroke versus first-time seizure.  They have ordered EEG as well as brain MRI and recommend medicine for admission.  UA is questionable for UTI.  Urine culture sent.  I did discuss this finding with patient and daughter who is at bedside and patient does endorse some urinary discomfort therefore will give antibiotics to cover for UTI.  Patient is allergic to cephalosporins therefore I will give Bactrim as treatment.  Patient tolerates Bactrim well in the past.  7:20 PM Appreciate consultation from [facility] Dr. Loney Loh who agrees to see and will admit pt  BP (!) 161/100   Pulse 71   Temp 98.6 F (37 C) (Oral)   Resp (!) 25   SpO2 92%   Results for orders placed or performed during the hospital encounter of 07/29/20  Urinalysis, Routine w reflex microscopic  Result Value Ref Range   Color, Urine YELLOW YELLOW   APPearance HAZY (A) CLEAR   Specific Gravity, Urine 1.012 1.005 - 1.030   pH 6.0 5.0 - 8.0   Glucose, UA NEGATIVE NEGATIVE mg/dL   Hgb urine dipstick MODERATE (A) NEGATIVE   Bilirubin Urine NEGATIVE NEGATIVE   Ketones, ur NEGATIVE NEGATIVE mg/dL   Protein, ur 30 (A) NEGATIVE mg/dL   Nitrite NEGATIVE NEGATIVE   Leukocytes,Ua LARGE (A) NEGATIVE   RBC / HPF 11-20 0 - 5 RBC/hpf   WBC, UA >50 (H) 0 - 5 WBC/hpf   Bacteria, UA FEW (A) NONE SEEN   Squamous Epithelial / LPF 0-5 0 - 5  Brain natriuretic peptide  Result Value Ref Range   B Natriuretic Peptide 454.7 (H) 0.0 - 100.0 pg/mL  Comprehensive metabolic  panel  Result Value Ref Range   Sodium 140 135 - 145 mmol/L   Potassium 4.1 3.5 - 5.1 mmol/L   Chloride 103 98 - 111 mmol/L   CO2 31 22 - 32 mmol/L   Glucose, Bld 173 (H) 70 - 99 mg/dL   BUN 38 (H) 8 - 23 mg/dL   Creatinine, Ser 1.61 (H) 0.44 - 1.00 mg/dL   Calcium 9.2 8.9 - 09.6 mg/dL   Total Protein 7.4 6.5 - 8.1 g/dL   Albumin 2.7 (L) 3.5 - 5.0 g/dL   AST 18 15 - 41 U/L   ALT 13 0 - 44 U/L   Alkaline Phosphatase 65 38 - 126 U/L   Total Bilirubin 0.4 0.3 - 1.2 mg/dL   GFR, Estimated 37 (L) >60 mL/min   Anion gap 6 5 - 15  CBC with Differential  Result Value Ref Range   WBC 7.9 4.0 - 10.5 K/uL   RBC 3.31 (L) 3.87 - 5.11 MIL/uL   Hemoglobin 10.7 (L) 12.0 - 15.0 g/dL   HCT 04.5 (L) 40.9 - 81.1 %   MCV 101.8 (H) 80.0 - 100.0 fL   MCH 32.3 26.0 - 34.0 pg   MCHC 31.8 30.0 - 36.0 g/dL   RDW 91.4 (H) 78.2 - 95.6 %   Platelets 219 150 - 400 K/uL   nRBC 0.0 0.0 -  0.2 %   Neutrophils Relative % 54 %   Neutro Abs 4.2 1.7 - 7.7 K/uL   Lymphocytes Relative 32 %   Lymphs Abs 2.5 0.7 - 4.0 K/uL   Monocytes Relative 10 %   Monocytes Absolute 0.8 0.1 - 1.0 K/uL   Eosinophils Relative 3 %   Eosinophils Absolute 0.2 0.0 - 0.5 K/uL   Basophils Relative 1 %   Basophils Absolute 0.1 0.0 - 0.1 K/uL   Immature Granulocytes 0 %   Abs Immature Granulocytes 0.01 0.00 - 0.07 K/uL  CBG monitoring, ED  Result Value Ref Range   Glucose-Capillary 172 (H) 70 - 99 mg/dL   CT Head Wo Contrast  Result Date: 07/29/2020 CLINICAL DATA:  Altered mental status EXAM: CT HEAD WITHOUT CONTRAST TECHNIQUE: Contiguous axial images were obtained from the base of the skull through the vertex without intravenous contrast. COMPARISON:  Head CT and MRI May 13, 2020 FINDINGS: Brain: No evidence of acute large vascular territory infarction, hemorrhage, hydrocephalus, extra-axial collection or mass lesion/mass effect. Age related global parenchymal volume loss with ex vacuo dilatation of the ventricular system.  Similar moderate burden of chronic ischemic small vessel white matter disease. Lacunar type infarcts in the bilateral basal ganglia and right thalamus. Vascular: No hyperdense vessel. Atherosclerotic calcifications of the intracranial portions of the internal carotid and vertebral arteries. Skull: Hyperostosis frontalis. Negative for fracture or focal lesion. Sinuses/Orbits: Mucosal thickening of the right maxillary sinus and ethmoid air cells with near complete opacification of the left maxillary sinus. Prior lens surgery. Other: None IMPRESSION: 1. No acute intracranial findings. 2. Age-related global parenchymal volume loss and chronic ischemic small vessel white matter disease. Lacunar type infarcts in the bilateral basal ganglia and right thalamus. 3. New paranasal sinus disease as can be seen with sinusitis. Electronically Signed   By: Maudry Mayhew MD   On: 07/29/2020 13:19   DG Chest Portable 1 View  Result Date: 07/29/2020 CLINICAL DATA:  Altered mental status. EXAM: PORTABLE CHEST 1 VIEW COMPARISON:  May 13, 2020. FINDINGS: Stable cardiomegaly. Status post coronary bypass graft. No pneumothorax or pleural effusion is noted. No acute pulmonary disease is noted. Bony thorax is unremarkable. IMPRESSION: No active disease. Electronically Signed   By: Lupita Raider M.D.   On: 07/29/2020 12:24      Fayrene Helper, PA-C 07/29/20 1921    Terrilee Files, MD 07/30/20 [phone]

## 2020-07-29 NOTE — ED Notes (Signed)
Pt to mri 

## 2020-07-29 NOTE — ED Notes (Signed)
Patient transported to CT 

## 2020-07-30 DIAGNOSIS — N390 Urinary tract infection, site not specified: Secondary | ICD-10-CM | POA: Diagnosis not present

## 2020-07-30 DIAGNOSIS — E1122 Type 2 diabetes mellitus with diabetic chronic kidney disease: Secondary | ICD-10-CM | POA: Diagnosis not present

## 2020-07-30 DIAGNOSIS — N1832 Chronic kidney disease, stage 3b: Secondary | ICD-10-CM | POA: Diagnosis not present

## 2020-07-30 DIAGNOSIS — R4182 Altered mental status, unspecified: Secondary | ICD-10-CM

## 2020-07-30 DIAGNOSIS — R569 Unspecified convulsions: Secondary | ICD-10-CM | POA: Diagnosis not present

## 2020-07-30 LAB — BASIC METABOLIC PANEL
Anion gap: 7 (ref 5–15)
BUN: 33 mg/dL — ABNORMAL HIGH (ref 8–23)
CO2: 27 mmol/L (ref 22–32)
Calcium: 9.5 mg/dL (ref 8.9–10.3)
Chloride: 107 mmol/L (ref 98–111)
Creatinine, Ser: 1.23 mg/dL — ABNORMAL HIGH (ref 0.44–1.00)
GFR, Estimated: 42 mL/min — ABNORMAL LOW (ref >60)
Glucose, Bld: 184 mg/dL — ABNORMAL HIGH (ref 70–99)
Potassium: 3.8 mmol/L (ref 3.5–5.1)
Sodium: 141 mmol/L (ref 135–145)

## 2020-07-30 LAB — GLUCOSE, CAPILLARY
Glucose-Capillary: 144 mg/dL — ABNORMAL HIGH (ref 70–99)
Glucose-Capillary: 111 mg/dL — ABNORMAL HIGH (ref 70–99)
Glucose-Capillary: 180 mg/dL — ABNORMAL HIGH (ref 70–99)
Glucose-Capillary: 210 mg/dL — ABNORMAL HIGH (ref 70–99)

## 2020-07-30 LAB — CBG MONITORING, ED: Glucose-Capillary: 124 mg/dL — ABNORMAL HIGH (ref 70–99)

## 2020-07-30 LAB — SARS CORONAVIRUS 2 (TAT 6-24 HRS): SARS Coronavirus 2: NEGATIVE

## 2020-07-30 MED ORDER — ONDANSETRON HCL 4 MG/2ML IJ SOLN
4.0000 mg | Freq: Four times a day (QID) | INTRAMUSCULAR | Status: DC | PRN
  Administered 2020-07-30: 4 mg via INTRAVENOUS
  Filled 2020-07-30: qty 2

## 2020-07-30 MED ORDER — SULFAMETHOXAZOLE-TRIMETHOPRIM 400-80 MG PO TABS: 1.0000 | ORAL_TABLET | Freq: Two times a day (BID) | ORAL | Status: DC

## 2020-07-30 MED ORDER — DABIGATRAN ETEXILATE MESYLATE 75 MG PO CAPS
75.0000 mg | ORAL_CAPSULE | Freq: Two times a day (BID) | ORAL | Status: DC
  Administered 2020-07-30 – 2020-07-31 (×3): 75 mg via ORAL
  Filled 2020-07-30 (×4): qty 1

## 2020-07-30 MED ORDER — DILTIAZEM HCL ER COATED BEADS 180 MG PO CP24
180.0000 mg | ORAL_CAPSULE | Freq: Every day | ORAL | Status: DC
  Administered 2020-07-30: 180 mg via ORAL
  Filled 2020-07-30 (×2): qty 1

## 2020-07-30 MED ORDER — LEVETIRACETAM ER 500 MG PO TB24
500.0000 mg | ORAL_TABLET | Freq: Every day | ORAL | Status: DC
  Administered 2020-07-30: 500 mg via ORAL
  Filled 2020-07-30 (×2): qty 1

## 2020-07-30 MED ORDER — METOPROLOL SUCCINATE ER 25 MG PO TB24
25.0000 mg | ORAL_TABLET | Freq: Every day | ORAL | Status: DC
  Administered 2020-07-30 – 2020-07-31 (×2): 25 mg via ORAL
  Filled 2020-07-30 (×2): qty 1

## 2020-07-30 NOTE — Procedures (Signed)
Patient Name: [name]  MRN: [id number]  Epilepsy Attending: Charlsie Quest  Referring Physician/Provider: Jimmye Norman, NP Date: 07/29/2020 Duration: 22.31 mins  Patient history:  87 yo female who presented today after being found by ALF staff in and out of responsiveness with an episode of tensing of extremities with cortical posturing lasting 5 mins. EEG to evaluate for seizures.  Level of alertness: Awake  AEDs during EEG study: None  Technical aspects: This EEG study was done with scalp electrodes positioned according to the 10-20 International system of electrode placement. Electrical activity was acquired at a sampling rate of 500Hz  and reviewed with a high frequency filter of 70Hz  and a low frequency filter of 1Hz . EEG data were recorded continuously and digitally stored.   Description: The posterior dominant rhythm consists of 8 Hz activity of moderate voltage (25-35 uV) seen predominantly in posterior head regions, symmetric and reactive to eye opening and eye closing. EEG showed intermittent generalized 3 to 6 Hz theta-delta slowing. Hyperventilation and photic stimulation were not performed.     ABNORMALITY - Intermittent slow, generalized  IMPRESSION: This study is suggestive of mild diffuse encephalopathy, nonspecific etiology. No seizures or epileptiform discharges were seen throughout the recording.  Nickoli Bagheri 

## 2020-07-30 NOTE — Plan of Care (Signed)
  Problem: Clinical Measurements: Goal: Will remain free from infection Outcome: Progressing Goal: Cardiovascular complication will be avoided Outcome: Progressing   

## 2020-07-30 NOTE — Progress Notes (Signed)
Progress Note    [name]  HQI:[id number] DOB: [birth year]  DOA: 07/29/2020 PCP: [name], MD    Brief Narrative:     Medical records reviewed and are as summarized below:  [name] is an 87 y.o. female with medical history significant of vascular dementia, hypothyroidism, CKD stage IIIb, hypertension, hyperlipidemia, type 2 diabetes, chronic A. fib on anticoagulation, CAD status post CABG, CHF, history of DVT, GERD, PVD, rheumatoid arthritis on methotrexate, recurrent UTIs on methenamine, acute CVA in February 2022 presented to the ED via EMS from her nursing home for evaluation of altered mental status. Thought to be having a seizure.  From Long term care part of [facility].  Assessment/Plan:   Principal Problem:   AMS (altered mental status) Active Problems:   Hypothyroidism   ATRIAL FIBRILLATION   Rheumatoid arthritis (HCC)   Diabetes mellitus with renal complications (HCC)   Frequent UTI   Hyperlipidemia   Vascular dementia, uncomplicated (HCC)   Sinusitis   Suspected seizures -Keppra 500mg  XR po qhs.  Possible UTI Patient with history of recurrent UTIs on methenamine.  She is endorsing dysuria. UA with negative nitrite, large amount of leukocytes, 11-20 RBCs, greater than 50 WBCs, and few bacteria.  No fever, leukocytosis, or signs of sepsis.  Prior urine culture from February growing Proteus and E. coli sensitive to Bactrim.  She is allergic to cephalosporins and fluoroquinolones. -Continue Bactrim. Urine culture pending.  -monitor Cr  Sinusitis Patient is endorsing frontal headaches.  CT showing mucosal thickening of the right maxillary sinus and ethmoid air cells with near complete opacification of the left maxillary sinus. -doxycycline started in ER (patient with multiple allergies)  Hypothyroidism -Continue home Synthroid  Hyperlipidemia -Continue pravastatin  Type 2 diabetes A1c 7.6 on 05/14/2020. -Hold home metformin.  Order  sliding scale insulin sensitive ACHS.  Chronic A. Fib Currently rate controlled. -resume home meds  Rheumatoid arthritis -Continue methotrexate  CHF BNP elevated at 454 but chest x-ray clear.  She has no peripheral edema.  Echo done in February 2022 showing normal LVEF of 55 to 60% and diastolic parameters indeterminate. -may need PRN demadex  Family Communication/Anticipated D/C date and plan/Code Status   DVT prophylaxis: Lovenox ordered. Code Status: dnr  Family Communication: at bedside Disposition Plan: Status is: Observation  The patient remains OBS appropriate and will d/c before 2 midnights.  Dispo: The patient is from: ltc              Anticipated d/c is to: ltc              Patient currently is medically stable to d/c.   Difficult to place patient No         Medical Consultants:    neurology  Subjective:   C/o nausea  Objective:    Vitals:   07/30/20 0513 07/30/20 0530 07/30/20 0800 07/30/20 0941  BP: 139/83 130/67    Pulse: 87 99    Resp: 16 20    Temp: 98.5 F (36.9 C) 98.6 F (37 C)  97.8 F (36.6 C)  TempSrc: Oral Oral  Oral  SpO2: 98% 98%    Weight:   65.8 kg     Intake/Output Summary (Last 24 hours) at 07/30/2020 1300 Last data filed at 07/30/2020 1245 Gross per 24 hour  Intake 0 ml  Output 300 ml  Net -300 ml   Filed Weights   07/30/20 0800  Weight: 65.8 kg    Exam:  General: Appearance:  Overweight female in no acute distress     Lungs:      respirations unlabored  Heart:    Normal heart rate.   MS:   All extremities are intact.   Neurologic:   Awake, alert    Data Reviewed:   I have personally reviewed following labs and imaging studies:  Labs: Labs show the following:   Basic Metabolic Panel: Recent Labs  Lab 07/29/20 1148 07/30/20 0841  NA 140 141  K 4.1 3.8  CL 103 107  CO2 31 27  GLUCOSE 173* 184*  BUN 38* 33*  CREATININE 1.38* 1.23*  CALCIUM 9.2 9.5   GFR Estimated Creatinine Clearance:  28.1 mL/min (A) (by C-G formula based on SCr of 1.23 mg/dL (H)). Liver Function Tests: Recent Labs  Lab 07/29/20 1148  AST 18  ALT 13  ALKPHOS 65  BILITOT 0.4  PROT 7.4  ALBUMIN 2.7*   No results for input(s): LIPASE, AMYLASE in the last 168 hours. No results for input(s): AMMONIA in the last 168 hours. Coagulation profile No results for input(s): INR, PROTIME in the last 168 hours.  CBC: Recent Labs  Lab 07/29/20 1431  WBC 7.9  NEUTROABS 4.2  HGB 10.7*  HCT 33.7*  MCV 101.8*  PLT 219   Cardiac Enzymes: No results for input(s): CKTOTAL, CKMB, CKMBINDEX, TROPONINI in the last 168 hours. BNP (last 3 results) No results for input(s): PROBNP in the last 8760 hours. CBG: Recent Labs  Lab 07/29/20 1136 07/30/20 0100 07/30/20 0726 07/30/20 1222  GLUCAP 172* 124* 144* 111*   D-Dimer: No results for input(s): DDIMER in the last 72 hours. Hgb A1c: No results for input(s): HGBA1C in the last 72 hours. Lipid Profile: No results for input(s): CHOL, HDL, LDLCALC, TRIG, CHOLHDL, LDLDIRECT in the last 72 hours. Thyroid function studies: No results for input(s): TSH, T4TOTAL, T3FREE, THYROIDAB in the last 72 hours.  Invalid input(s): FREET3 Anemia work up: No results for input(s): VITAMINB12, FOLATE, FERRITIN, TIBC, IRON, RETICCTPCT in the last 72 hours. Sepsis Labs: Recent Labs  Lab 07/29/20 1431  WBC 7.9    Microbiology Recent Results (from the past 240 hour(s))  Urine culture     Status: None (Preliminary result)   Collection Time: 07/29/20  1:06 PM   Specimen: Urine, Random  Result Value Ref Range Status   Specimen Description URINE, RANDOM  Final   Special Requests NONE  Final   Culture   Final    CULTURE REINCUBATED FOR BETTER GROWTH Performed at [facility], [address] [place], [place] [address]    Report Status PENDING  Incomplete  SARS CORONAVIRUS 2 (TAT 6-24 HRS) Nasopharyngeal Nasopharyngeal Swab     Status: None   Collection Time:  07/30/20  2:59 AM   Specimen: Nasopharyngeal Swab  Result Value Ref Range Status   SARS Coronavirus 2 NEGATIVE NEGATIVE Final    Comment: (NOTE) SARS-CoV-2 target nucleic acids are NOT DETECTED.  The SARS-CoV-2 RNA is generally detectable in upper and lower respiratory specimens during the acute phase of infection. Negative results do not preclude SARS-CoV-2 infection, do not rule out co-infections with other pathogens, and should not be used as the sole basis for treatment or other patient management decisions. Negative results must be combined with clinical observations, patient history, and epidemiological information. The expected result is Negative.  Fact Sheet for Patients: HairSlick.no  Fact Sheet for Healthcare Providers: quierodirigir.com  This test is not yet approved or cleared by the [place]  FDA and  has been authorized for detection and/or diagnosis of SARS-CoV-2 by FDA under an Emergency Use Authorization (EUA). This EUA will remain  in effect (meaning this test can be used) for the duration of the COVID-19 declaration under Se ction 564(b)(1) of the Act, 21 U.S.C. section 360bbb-3(b)(1), unless the authorization is terminated or revoked sooner.  Performed at [facility], [address] [place], [place] [address]     Procedures and diagnostic studies:  CT Head Wo Contrast  Result Date: 07/29/2020 CLINICAL DATA:  Altered mental status EXAM: CT HEAD WITHOUT CONTRAST TECHNIQUE: Contiguous axial images were obtained from the base of the skull through the vertex without intravenous contrast. COMPARISON:  Head CT and MRI May 13, 2020 FINDINGS: Brain: No evidence of acute large vascular territory infarction, hemorrhage, hydrocephalus, extra-axial collection or mass lesion/mass effect. Age related global parenchymal volume loss with ex vacuo dilatation of the ventricular system. Similar moderate burden  of chronic ischemic small vessel white matter disease. Lacunar type infarcts in the bilateral basal ganglia and right thalamus. Vascular: No hyperdense vessel. Atherosclerotic calcifications of the intracranial portions of the internal carotid and vertebral arteries. Skull: Hyperostosis frontalis. Negative for fracture or focal lesion. Sinuses/Orbits: Mucosal thickening of the right maxillary sinus and ethmoid air cells with near complete opacification of the left maxillary sinus. Prior lens surgery. Other: None IMPRESSION: 1. No acute intracranial findings. 2. Age-related global parenchymal volume loss and chronic ischemic small vessel white matter disease. Lacunar type infarcts in the bilateral basal ganglia and right thalamus. 3. New paranasal sinus disease as can be seen with sinusitis. Electronically Signed   By: Maudry Mayhew MD   On: 07/29/2020 13:19   MR BRAIN WO CONTRAST  Result Date: 07/29/2020 CLINICAL DATA:  Encephalopathy EXAM: MRI HEAD WITHOUT CONTRAST TECHNIQUE: Multiplanar, multiecho pulse sequences of the brain and surrounding structures were obtained without intravenous contrast. COMPARISON:  None. FINDINGS: Brain: No acute infarct, mass effect or extra-axial collection. Numerous chronic microhemorrhages in a predominantly peripheral distribution. Hyperintense T2-weighted signal is moderately widespread throughout the white matter. Generalized volume loss without a clear lobar predilection. There are multiple old central small vessel infarcts. The midline structures are normal. Vascular: Major flow voids are preserved. Skull and upper cervical spine: Normal calvarium and skull base. Visualized upper cervical spine and soft tissues are normal. Sinuses/Orbits:Bilateral maxillary mucosal thickening. Normal orbits. IMPRESSION: 1. No acute intracranial abnormality. 2. Numerous chronic microhemorrhages in a predominantly peripheral distribution, most consistent with cerebral amyloid angiopathy. 3.  Moderate chronic small vessel ischemic disease. Electronically Signed   By: Deatra Robinson M.D.   On: 07/29/2020 23:02   DG Chest Portable 1 View  Result Date: 07/29/2020 CLINICAL DATA:  Altered mental status. EXAM: PORTABLE CHEST 1 VIEW COMPARISON:  May 13, 2020. FINDINGS: Stable cardiomegaly. Status post coronary bypass graft. No pneumothorax or pleural effusion is noted. No acute pulmonary disease is noted. Bony thorax is unremarkable. IMPRESSION: No active disease. Electronically Signed   By: Lupita Raider M.D.   On: 07/29/2020 12:24   EEG adult  Result Date: 07/30/2020 Charlsie Quest, MD     07/30/2020  8:43 AM Patient Name: [name] MRN: [id number] Epilepsy Attending: Charlsie Quest Referring Physician/Provider: Jimmye Norman, NP Date: 07/29/2020 Duration: 22.31 mins Patient history:  87 yo female who presented today after being found by ALF staff in and out of responsiveness with an episode of tensing of extremities with cortical posturing lasting 5 mins. EEG to  evaluate for seizures. Level of alertness: Awake AEDs during EEG study: None Technical aspects: This EEG study was done with scalp electrodes positioned according to the 10-20 International system of electrode placement. Electrical activity was acquired at a sampling rate of  and reviewed with a high frequency filter of  and a low frequency filter of . EEG data were recorded continuously and digitally stored. Description: The posterior dominant rhythm consists of 8 Hz activity of moderate voltage (25-35 uV) seen predominantly in posterior head regions, symmetric and reactive to eye opening and eye closing. EEG showed intermittent generalized 3 to 6 Hz theta-delta slowing. Hyperventilation and photic stimulation were not performed.   ABNORMALITY - Intermittent slow, generalized IMPRESSION: This study is suggestive of mild diffuse encephalopathy, nonspecific etiology. No seizures or epileptiform discharges were  seen throughout the recording. [name] Annabelle Harman    Medications:   . calcium-vitamin D  1 tablet Oral BID  . cholecalciferol  2,000 Units Oral Daily  . dabigatran  75 mg Oral Q12H  . docusate sodium  100 mg Oral BID  . doxycycline  100 mg Oral Q12H  . ferrous sulfate  325 mg Oral BID  . FLUoxetine  10 mg Oral Daily  . folic acid  1 mg Oral Daily  . insulin aspart  0-5 Units Subcutaneous QHS  . insulin aspart  0-9 Units Subcutaneous TID WC  . levETIRAcetam  500 mg Oral QHS  . levothyroxine  75 mcg Oral QAC breakfast  . memantine  10 mg Oral QHS  . [START ON 07/31/2020] methotrexate  20 mg Oral Q Fri  . mirtazapine  7.5 mg Oral QHS  . polyethylene glycol  17 g Oral Daily  . polyvinyl alcohol  1 drop Both Eyes QID  . pravastatin  40 mg Oral QHS  . saccharomyces boulardii  250 mg Oral BID  . [START ON 07/31/2020] sulfamethoxazole-trimethoprim  1 tablet Oral Q12H  . vitamin B-12  1,000 mcg Oral Daily   Continuous Infusions:   LOS: 0 days   Joseph Art  [facility]   How to contact the [facility] Attending or Consulting provider 7A - 7P or covering provider during after hours 7P -7A, for this patient?  1. Check the care team in [facility] and look for a) attending/consulting TRH provider listed and b) the [facility] team listed 2. Log into [email or web address] and use [place]'s universal password to access. If you do not have the password, please contact the hospital operator. 3. Locate the [facility] provider you are looking for under [facility] and page to a number that you can be directly reached. 4. If you still have difficulty reaching the provider, please page the [facility] (Director on Call) for the Hospitalists listed on amion for assistance.  07/30/2020, 1:00 PM

## 2020-07-30 NOTE — NC FL2 (Signed)
[place] MEDICAID FL2 LEVEL OF CARE SCREENING TOOL     IDENTIFICATION  Patient Name: [name] Birthdate: 07/18/1929 Sex: female Admission Date (Current Location): 07/29/2020  [place] and [place] Number:  [facility]nes Bast [id number] S Facility and Address:  [facility], [address], [place], [place] [address]      Provider Number: [id number]  Attending Physician Name and Address:  Joseph Art, DO  Relative Name and Phone Number:  Maureen Ralphs Daughter [phone]  [phone]    Current Level of Care: Hospital Recommended Level of Care: Skilled Nursing Facility Prior Approval Number:    Date Approved/Denied:   PASRR Number: [id number]  Discharge Plan: SNF    Current Diagnoses: Patient Active Problem List   Diagnosis Date Noted  . AMS (altered mental status) 07/29/2020  . Sinusitis 07/29/2020  . TIA (transient ischemic attack) 05/13/2020  . Vascular dementia, uncomplicated (HCC) 05/13/2020  . AKI (acute kidney injury) (HCC) 01/17/2018  . MDD (major depressive disorder) 01/15/2018  . Macrocytic anemia 09/22/2017  . Proteinuria 04/06/2017  . Insomnia 12/01/2016  . CAD (coronary artery disease) 11/17/2016  . Hyperlipidemia 11/17/2016  . Hypertension 11/17/2016  . Aortic atherosclerosis (HCC) 11/03/2016  . CKD (chronic kidney disease) stage 3, GFR 30-59 ml/min (HCC) 04/14/2015  . Colonic constipation 04/14/2015  . Frequent UTI 04/01/2015  . Diabetes mellitus with renal complications (HCC) 03/03/2015  . Atrophic vaginitis 02/24/2015  . Rheumatoid arthritis (HCC) 02/18/2015  . DNR (do not resuscitate) 02/13/2015  . Occlusion and stenosis of carotid artery without mention of cerebral infarction 05/02/2013  . 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 FEMORAL BRUIT 11/24/2009  . Hypothyroidism 06/26/2009  . CEREBROVASCULAR DISEASE 06/25/2009  . ATRIAL FIBRILLATION 05/19/2008  . Chronic diastolic heart failure (HCC) 05/19/2008   . RENAL ARTERY STENOSIS 05/19/2008  . PERIPHERAL VASCULAR DISEASE 05/19/2008    Orientation RESPIRATION BLADDER Height & Weight     Self,Place  Normal External catheter Weight: 145 lb (65.8 kg) Height:     BEHAVIORAL SYMPTOMS/MOOD NEUROLOGICAL BOWEL NUTRITION STATUS      Continent Diet (Heart healthy/carb modified.  See discharge summary.)  AMBULATORY STATUS COMMUNICATION OF NEEDS Skin   Limited Assist Verbally Other (Comment) (blister)                       Personal Care Assistance Level of Assistance  Bathing,Feeding,Dressing Bathing Assistance: Limited assistance Feeding assistance: Limited assistance Dressing Assistance: Limited assistance     Functional Limitations Info  Sight,Hearing,Speech Sight Info: Adequate Hearing Info: Adequate Speech Info: Adequate    SPECIAL CARE FACTORS FREQUENCY                       Contractures Contractures Info: Not present    Additional Factors Info  Code Status,Allergies,Insulin Sliding Scale Code Status Info: DNR Allergies Info: Cefuroxime, Fish Allergy, Ciprofloxacin, Codeine, Sertraline Hcl, Cephalexin, Paxil (Paroxetine Hcl)   Insulin Sliding Scale Info: see discharge summary       Current Medications (07/30/2020):  This is the current hospital active medication list Current Facility-Administered Medications  Medication Dose Route Frequency Provider Last Rate Last Admin  . acetaminophen (TYLENOL) tablet 650 mg  650 mg Oral Q6H PRN John Giovanni, MD       Or  . acetaminophen (TYLENOL) suppository 650 mg  650 mg Rectal Q6H PRN John Giovanni, MD      . calcium-vitamin D (OSCAL WITH D) 500-200 MG-UNIT per tablet 1 tablet  1 tablet  Oral BID John Giovanni, MD   1 tablet at 07/30/20 [phone]  . cholecalciferol (VITAMIN D3) tablet 2,000 Units  2,000 Units Oral Daily John Giovanni, MD   2,000 Units at 07/30/20 0830  . dabigatran (PRADAXA) capsule 75 mg  75 mg Oral Q12H Bhagat, Srishti L, MD   75 mg at  07/30/20 1012  . diltiazem (CARDIZEM CD) 24 hr capsule 180 mg  180 mg Oral QHS Vann, Jessica U, DO   180 mg at 07/30/20 1417  . docusate sodium (COLACE) capsule 100 mg  100 mg Oral BID John Giovanni, MD   100 mg at 07/30/20 0830  . doxycycline (VIBRA-TABS) tablet 100 mg  100 mg Oral Q12H John Giovanni, MD   100 mg at 07/30/20 0829  . ferrous sulfate tablet 325 mg  325 mg Oral BID John Giovanni, MD   325 mg at 07/30/20 0831  . FLUoxetine (PROZAC) capsule 10 mg  10 mg Oral Daily John Giovanni, MD   10 mg at 07/30/20 0830  . folic acid (FOLVITE) tablet 1 mg  1 mg Oral Daily John Giovanni, MD   1 mg at 07/30/20 0829  . insulin aspart (novoLOG) injection 0-5 Units  0-5 Units Subcutaneous QHS John Giovanni, MD      . insulin aspart (novoLOG) injection 0-9 Units  0-9 Units Subcutaneous TID WC John Giovanni, MD      . levETIRAcetam (KEPPRA XR) 24 hr tablet 500 mg  500 mg Oral QHS Kirby-Graham, Beather Arbour, NP      . levothyroxine (SYNTHROID) tablet 75 mcg  75 mcg Oral QAC breakfast John Giovanni, MD   75 mcg at 07/30/20 0644  . memantine (NAMENDA) tablet 10 mg  10 mg Oral QHS John Giovanni, MD   10 mg at 07/30/20 0030  . [START ON 07/31/2020] methotrexate (RHEUMATREX) tablet 20 mg  20 mg Oral Q Joesphine Bare, MD      . metoprolol succinate (TOPROL-XL) 24 hr tablet 25 mg  25 mg Oral Daily Vann, Jessica U, DO   25 mg at 07/30/20 1417  . mirtazapine (REMERON) tablet 7.5 mg  7.5 mg Oral QHS John Giovanni, MD   7.5 mg at 07/30/20 0034  . ondansetron (ZOFRAN) injection 4 mg  4 mg Intravenous Q6H PRN Marlin Canary U, DO   4 mg at 07/30/20 1042  . polyethylene glycol (MIRALAX / GLYCOLAX) packet 17 g  17 g Oral Daily John Giovanni, MD   17 g at 07/30/20 0827  . polyvinyl alcohol (LIQUIFILM TEARS) 1.4 % ophthalmic solution 1 drop  1 drop Both Eyes QID John Giovanni, MD   1 drop at 07/30/20 1417  . pravastatin (PRAVACHOL) tablet 40 mg  40 mg Oral QHS  John Giovanni, MD   40 mg at 07/30/20 0035  . saccharomyces boulardii (FLORASTOR) capsule 250 mg  250 mg Oral BID John Giovanni, MD   250 mg at 07/30/20 0830  . [START ON 07/31/2020] sulfamethoxazole-trimethoprim (BACTRIM) 400-80 MG per tablet 1 tablet  1 tablet Oral Q12H Leander Rams, [facility]      . vitamin B-12 (CYANOCOBALAMIN) tablet 1,000 mcg  1,000 mcg Oral Daily John Giovanni, MD   1,000 mcg at 07/30/20 0831     Discharge Medications: Please see discharge summary for a list of discharge medications.  Relevant Imaging Results:  Relevant Lab Results:   Additional Information SS# - [id number]  Lorri Frederick, LCSW

## 2020-07-30 NOTE — ED Notes (Signed)
This neuro  Check is not accurate. The pt can only state her name  When asked other questions she rambles she makes eye contact  She does not follow commands  But she moves her arms and legs randomly.  When she is asked a question she often will ask the same question back.

## 2020-07-30 NOTE — ED Notes (Signed)
Attempted to call report; RN unable to take report at this time.

## 2020-07-30 NOTE — Plan of Care (Signed)

## 2020-07-30 NOTE — Progress Notes (Signed)
pts daughter refused inulin this entire shift, pt daughter was educated however she still refused.   Pts daughter stated,  "I will let you know when she needs insulin, I will make that determination",   today's CBG" noted at   Breakfast 144 Mid day     111 Dinner   180

## 2020-07-30 NOTE — Progress Notes (Signed)
PHARMACY NOTE:  ANTIMICROBIAL RENAL DOSAGE ADJUSTMENT  Current antimicrobial regimen includes a mismatch between antimicrobial dosage and estimated renal function.  As per policy approved by the Pharmacy & Therapeutics and Medical Executive Committees, the antimicrobial dosage will be adjusted accordingly.  Current antimicrobial dosage:  Bactrim DS BID  Indication: UTI  Renal Function:  Estimated Creatinine Clearance: 25.1 mL/min (A) (by C-G formula based on SCr of 1.38 mg/dL (H)).  Antimicrobial dosage has been changed to:  Bactrim SS BID starting tomorrow since received DS today and last night      Thank you for allowing pharmacy to be a part of this patient's care.  Alphia Moh, PharmD, BCPS, BCCP Clinical Pharmacist  Please check AMION for all [facility] Pharmacy phone numbers After 10:00 PM, call Main Pharmacy [phone]

## 2020-07-30 NOTE — ED Notes (Signed)
The pt reports that she cannot sleep.  Lights lowered warm blankets placed on the pt

## 2020-07-30 NOTE — TOC Initial Note (Signed)
Transition of Care [facility] - Initial/Assessment Note    Patient Details  Name: [name] MRN: [id number] Date of Birth: 1930/02/08  [facility]) CM/SW Contact:    Lorri Frederick, LCSW Phone Number: 07/30/2020, 4:16 PM  Clinical Narrative:   CSW informed that pt daughter requesting pt be discharged to [facility] LTC rather than back to [facility].  CSW spoke with Elease Hashimoto at [facility], they do not have any available LTC beds.  CSW spoke with daughter Archie Patten by phone and informed her of this, she is in agreement that pt will return to [facility].  Later, CSW came by the room and daughter was present.  Pt unable to participate in assessment.  Daughter also requesting that pt get PT services at [facility].  Pt is vaccinated but not boosted--was scheduled to receive yesterday at [facility].  CSW spoke with Wille Celeste at [facility], confirmed pt will return tomorrow.  Wille Celeste can request PT services for pt while in LTC.  Covid negative today.               Expected Discharge Plan: Long Term Nursing Home Barriers to Discharge: No Barriers Identified   Patient Goals and CMS Choice Patient states their goals for this hospitalization and ongoing recovery are:: keep pt mobile CMS Medicare.gov Compare Post Acute Care list provided to::  (current resident at [facility].  NA)    Expected Discharge Plan and Services Expected Discharge Plan: Long Term Nursing Home In-house Referral: Clinical Social Work   Post Acute Care Choice: Resumption of Svcs/PTA Provider Living arrangements for the past 2 months: Skilled Nursing Facility                                      Prior Living Arrangements/Services Living arrangements for the past 2 months: Skilled Nursing Facility Lives with:: Facility Resident          Need for Family Participation in Patient Care: Yes (Comment) Care giver support system in place?: Yes (comment) Current home services: Other (comment)  (na) Criminal Activity/Legal Involvement Pertinent to Current Situation/Hospitalization: No - Comment as needed  Activities of Daily Living      Permission Sought/Granted                  Emotional Assessment Appearance:: Appears stated age Attitude/Demeanor/Rapport: Unable to Assess Affect (typically observed): Unable to Assess Orientation: : Oriented to Self,Oriented to Place Alcohol / Substance Use: Not Applicable Psych Involvement: No (comment)  Admission diagnosis:  Transient alteration of awareness [R40.4] Lower urinary tract infectious disease [N39.0] AMS (altered mental status) [R41.82] Patient Active Problem List   Diagnosis Date Noted  . AMS (altered mental status) 07/29/2020  . Sinusitis 07/29/2020  . TIA (transient ischemic attack) 05/13/2020  . Vascular dementia, uncomplicated (HCC) 05/13/2020  . AKI (acute kidney injury) (HCC) 01/17/2018  . MDD (major depressive disorder) 01/15/2018  . Macrocytic anemia 09/22/2017  . Proteinuria 04/06/2017  . Insomnia 12/01/2016  . CAD (coronary artery disease) 11/17/2016  . Hyperlipidemia 11/17/2016  . Hypertension 11/17/2016  . Aortic atherosclerosis (HCC) 11/03/2016  . CKD (chronic kidney disease) stage 3, GFR 30-59 ml/min (HCC) 04/14/2015  . Colonic constipation 04/14/2015  . Frequent UTI 04/01/2015  . Diabetes mellitus with renal complications (HCC) 03/03/2015  . Atrophic vaginitis 02/24/2015  . Rheumatoid arthritis (HCC) 02/18/2015  . DNR (do not resuscitate) 02/13/2015  . Occlusion and stenosis of carotid artery without  mention of cerebral infarction 05/02/2013  . 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 FEMORAL BRUIT 11/24/2009  . Hypothyroidism 06/26/2009  . CEREBROVASCULAR DISEASE 06/25/2009  . ATRIAL FIBRILLATION 05/19/2008  . Chronic diastolic heart failure (HCC) 05/19/2008  . RENAL ARTERY STENOSIS 05/19/2008  . PERIPHERAL VASCULAR DISEASE 05/19/2008   PCP:  [name], MD Pharmacy:   No Pharmacies Listed    Social Determinants of Health (SDOH) Interventions    Readmission Risk Interventions No flowsheet data found.

## 2020-07-30 NOTE — Progress Notes (Addendum)
Neurology Progress Note  S: No overnight events. No seizure activity noted by staff. Patient feels well today. No HA, vision issues, n/v/d, SOB, abdominal pain.   Daughter arrives during exam.   O: Current vital signs: BP 130/67 (BP Location: Right Arm)   Pulse 99   Temp 98.6 F (37 C) (Oral)   Resp 20   Wt 65.8 kg   SpO2 98%   BMI 26.52 kg/m  Vital signs in last 24 hours: Temp:  [98.5 F (36.9 C)-98.6 F (37 C)] 98.6 F (37 C) (04/21 0530) Pulse Rate:  [49-122] 99 (04/21 0530) Resp:  [16-31] 20 (04/21 0530) BP: (125-177)/(60-100) 130/67 (04/21 0530) SpO2:  [92 %-98 %] 98 % (04/21 0530) Weight:  [65.8 kg] 65.8 kg (04/21 0800)  GENERAL: Awake, alert in NAD HEENT: Normocephalic and atraumatic LUNGS: Normal respiratory effort.  CV: RRR on tele.  ABDOMEN: Soft, nontender Ext: warm   NEURO:  Mental Status: AA&Ox3  Speech/Language: speech is without aphasia or dysarthria.  Naming, repetition, fluency, and comprehension intact.  Cranial Nerves:  II: PERRL. Visual fields full.  III, IV, VI: EOMI. Eyelids elevate symmetrically.  V: Sensation is intact to light touch and symmetrical to face.  VII: Smile is symmetrical. Able to puff cheeks and raise eyebrows.  VIII: hearing intact to voice. IX, X: Palate elevates symmetrically. Phonation is normal.  DP:OEUMPNTI shrug 5/5. XII: tongue is midline without fasciculations. Motor: 5/5 strength to all muscle groups tested.  Tone: is normal and bulk is normal Sensation- Intact to light touch bilaterally. Extinction absent to light touch to DSS.    Coordination: FTN intact bilaterally, HKS: no ataxia in BLE. No drift.  DTRs: 2+ throughout Gait- deferred  Medications  Current Facility-Administered Medications:  .  acetaminophen (TYLENOL) tablet 650 mg, 650 mg, Oral, Q6H PRN **OR** acetaminophen (TYLENOL) suppository 650 mg, 650 mg, Rectal, Q6H PRN, John Giovanni, MD .  calcium-vitamin D (OSCAL WITH D) 500-200 MG-UNIT per  tablet 1 tablet, 1 tablet, Oral, BID, John Giovanni, MD, 1 tablet at 07/30/20 0829 .  cholecalciferol (VITAMIN D3) tablet 2,000 Units, 2,000 Units, Oral, Daily, John Giovanni, MD, 2,000 Units at 07/30/20 0830 .  dabigatran (PRADAXA) capsule 75 mg, 75 mg, Oral, Q12H, Kenidy Crossland L, MD .  docusate sodium (COLACE) capsule 100 mg, 100 mg, Oral, BID, John Giovanni, MD, 100 mg at 07/30/20 0830 .  doxycycline (VIBRA-TABS) tablet 100 mg, 100 mg, Oral, Q12H, John Giovanni, MD, 100 mg at 07/30/20 0829 .  ferrous sulfate tablet 325 mg, 325 mg, Oral, BID, John Giovanni, MD, 325 mg at 07/30/20 0831 .  FLUoxetine (PROZAC) capsule 10 mg, 10 mg, Oral, Daily, John Giovanni, MD, 10 mg at 07/30/20 0830 .  folic acid (FOLVITE) tablet 1 mg, 1 mg, Oral, Daily, John Giovanni, MD, 1 mg at 07/30/20 0829 .  insulin aspart (novoLOG) injection 0-5 Units, 0-5 Units, Subcutaneous, QHS, Rathore, Vasundhra, MD .  insulin aspart (novoLOG) injection 0-9 Units, 0-9 Units, Subcutaneous, TID WC, Rathore, Vasundhra, MD .  levETIRAcetam (KEPPRA XR) 24 hr tablet 500 mg, 500 mg, Oral, QHS, Kirby-Graham, Beather Arbour, NP .  levothyroxine (SYNTHROID) tablet 75 mcg, 75 mcg, Oral, QAC breakfast, John Giovanni, MD, 75 mcg at 07/30/20 0644 .  memantine (NAMENDA) tablet 10 mg, 10 mg, Oral, QHS, John Giovanni, MD, 10 mg at 07/30/20 0030 .  [START ON 07/31/2020] methotrexate (RHEUMATREX) tablet 20 mg, 20 mg, Oral, Q Fri, Rathore, Vasundhra, MD .  mirtazapine (REMERON) tablet 7.5 mg, 7.5 mg, Oral, QHS,  John Giovanni, MD, 7.5 mg at 07/30/20 0034 .  polyethylene glycol (MIRALAX / GLYCOLAX) packet 17 g, 17 g, Oral, Daily, John Giovanni, MD, 17 g at 07/30/20 0827 .  polyvinyl alcohol (LIQUIFILM TEARS) 1.4 % ophthalmic solution 1 drop, 1 drop, Both Eyes, QID, John Giovanni, MD, 1 drop at 07/30/20 0840 .  pravastatin (PRAVACHOL) tablet 40 mg, 40 mg, Oral, QHS, John Giovanni, MD, 40 mg at  07/30/20 0035 .  saccharomyces boulardii (FLORASTOR) capsule 250 mg, 250 mg, Oral, BID, John Giovanni, MD, 250 mg at 07/30/20 0830 .  [START ON 07/31/2020] sulfamethoxazole-trimethoprim (BACTRIM) 400-80 MG per tablet 1 tablet, 1 tablet, Oral, Q12H, Leander Rams, RPH .  vitamin B-12 (CYANOCOBALAMIN) tablet 1,000 mcg, 1,000 mcg, Oral, Daily, John Giovanni, MD, 1,000 mcg at 07/30/20 0831  Pertinent Labs Hemoglobin A1c                       LDL                                     TSH                                   Na                                     ammonia                           Hgb  Imaging/Testing  EEG This study is suggestive of mild diffuse encephalopathy, nonspecific etiology. No seizures or epileptiform discharges were seen throughout the recording.  CT Head  MRI Brain-personally reviewed by Neurology MD 1. No acute intracranial abnormality. 2. Numerous chronic microhemorrhages in a predominantly peripheral distribution, most consistent with cerebral amyloid angiopathy. 3. Moderate chronic small vessel ischemic disease.   Assessment: 87 yo female with a history of stroke in Feb 2022 presented with a seizure like episode at ALF. Staff reported waxing and waning mental status 07/29/20 am after being seen her normal self one hour prior. Staff witnessed patient tensing up with cortical posturing last 5 minutes. Patient has no history of seizures, but she is more prone to seizures due to her past strokes and dementia. We will start Keppra at lower dose, 500mg  XR po at hs to help avoid drowsiness during the day.   If Keppra is poorly tolerated, defer to outpatient neurologist potentially weaning this medication.  Additionally, while the MRI findings do potentially fit a pattern seen in cerebral amyloid angiopathy, the fact that the patient has tolerated therapeutic anticoagulation for years without a lobar hemorrhage suggestive of no true underlying angiopathy.  Suspect that  the microhemorrhages are secondary to anticoagulation/potentially hypertension  Plan: 1. Keppra 500mg  XR po qhs.  2. Have patient f/up in outpatient neurology clinic 4-6 weeks after discharge.  3. Unless there is medical reason to keep her, she may be discharged from neurology perspective.   Side effects of Keppra explained to daughter. Patient has no history of behavioral issues with her dementia. Patient lives in ALF where staff give her meds. She does not drive, or bathe in a tub.   Pt seen by Jimmye Norman, MSN, APN-BC/Nurse Practitioner/Neuro  Pager: [phone]   Attending Neurologist's note:  I personally saw this patient, gathering history, performing a full neurologic examination, reviewing relevant labs, personally reviewing relevant imaging including MRI brain, and formulated the assessment and plan, adding the note above for completeness and clarity to accurately reflect my thoughts

## 2020-07-30 NOTE — ED Notes (Signed)
Report given to [facility] 2W.

## 2020-07-30 NOTE — ED Notes (Signed)
The pt remains  Alert and knows her name but unable to answer any other  Questions that shes asked.  This is normal for her according to the daughter that was here earlier c0-operate.  She says that she does not like being alone in the room

## 2020-07-31 DIAGNOSIS — E1122 Type 2 diabetes mellitus with diabetic chronic kidney disease: Secondary | ICD-10-CM

## 2020-07-31 DIAGNOSIS — R5381 Other malaise: Secondary | ICD-10-CM | POA: Diagnosis not present

## 2020-07-31 DIAGNOSIS — N390 Urinary tract infection, site not specified: Secondary | ICD-10-CM | POA: Diagnosis not present

## 2020-07-31 DIAGNOSIS — R4182 Altered mental status, unspecified: Secondary | ICD-10-CM | POA: Diagnosis not present

## 2020-07-31 DIAGNOSIS — R279 Unspecified lack of coordination: Secondary | ICD-10-CM | POA: Diagnosis not present

## 2020-07-31 DIAGNOSIS — N1832 Chronic kidney disease, stage 3b: Secondary | ICD-10-CM

## 2020-07-31 DIAGNOSIS — I959 Hypotension, unspecified: Secondary | ICD-10-CM | POA: Diagnosis not present

## 2020-07-31 DIAGNOSIS — Z743 Need for continuous supervision: Secondary | ICD-10-CM | POA: Diagnosis not present

## 2020-07-31 DIAGNOSIS — I499 Cardiac arrhythmia, unspecified: Secondary | ICD-10-CM | POA: Diagnosis not present

## 2020-07-31 LAB — BASIC METABOLIC PANEL
Anion gap: 7 (ref 5–15)
BUN: 38 mg/dL — ABNORMAL HIGH (ref 8–23)
CO2: 29 mmol/L (ref 22–32)
Calcium: 9.7 mg/dL (ref 8.9–10.3)
Chloride: 101 mmol/L (ref 98–111)
Creatinine, Ser: 1.45 mg/dL — ABNORMAL HIGH (ref 0.44–1.00)
GFR, Estimated: 35 mL/min — ABNORMAL LOW (ref >60)
Glucose, Bld: 120 mg/dL — ABNORMAL HIGH (ref 70–99)
Potassium: 4.4 mmol/L (ref 3.5–5.1)
Sodium: 137 mmol/L (ref 135–145)

## 2020-07-31 LAB — CBC
HCT: 36.3 % (ref 36.0–46.0)
Hemoglobin: 11.4 g/dL — ABNORMAL LOW (ref 12.0–15.0)
MCH: 31.7 pg (ref 26.0–34.0)
MCHC: 31.4 g/dL (ref 30.0–36.0)
MCV: 100.8 fL — ABNORMAL HIGH (ref 80.0–100.0)
Platelets: 217 10*3/uL (ref 150–400)
RBC: 3.6 MIL/uL — ABNORMAL LOW (ref 3.87–5.11)
RDW: 16.4 % — ABNORMAL HIGH (ref 11.5–15.5)
WBC: 10.7 10*3/uL — ABNORMAL HIGH (ref 4.0–10.5)
nRBC: 0 % (ref 0.0–0.2)

## 2020-07-31 LAB — GLUCOSE, CAPILLARY
Glucose-Capillary: 134 mg/dL — ABNORMAL HIGH (ref 70–99)
Glucose-Capillary: 164 mg/dL — ABNORMAL HIGH (ref 70–99)

## 2020-07-31 MED ORDER — TORSEMIDE 100 MG PO TABS: 50.0000 mg | ORAL_TABLET | Freq: Every day | ORAL | 1 refills | Status: AC

## 2020-07-31 MED ORDER — METHENAMINE MANDELATE 0.5 G PO TABS: 500.0000 mg | ORAL_TABLET | Freq: Two times a day (BID) | ORAL | Status: AC

## 2020-07-31 MED ORDER — DOXYCYCLINE HYCLATE 100 MG PO TABS: 100.0000 mg | ORAL_TABLET | Freq: Two times a day (BID) | ORAL | 0 refills | Status: DC

## 2020-07-31 MED ORDER — FOSFOMYCIN TROMETHAMINE 3 G PO PACK
3.0000 g | PACK | Freq: Once | ORAL | Status: AC
  Administered 2020-07-31: 3 g via ORAL
  Filled 2020-07-31: qty 3

## 2020-07-31 MED ORDER — LEVETIRACETAM ER 500 MG PO TB24: 500.0000 mg | ORAL_TABLET | Freq: Every day | ORAL | Status: DC

## 2020-07-31 NOTE — Progress Notes (Signed)
Attempted to call report to [facility]. Call back number left with receptionist for nurse.

## 2020-07-31 NOTE — Discharge Summary (Addendum)
Physician Discharge Summary  [name] ION:[id number] DOB: January 02, 1930 DOA: 07/29/2020  PCP: [name], MD  Admit date: 07/29/2020 Discharge date: 07/31/2020  Admitted From: SNF Discharge disposition:sNF   Recommendations for Outpatient Follow-Up:   Doxy through 4/25 Reduced demadex-- monitor fluid status D/c'd metformin due to elevated renal function-- consider tradjenta if not able to be diet controlled Seizure precautions with neurology follow up Frequent toileting  BMP 1 week  Discharge Diagnosis:   Principal Problem:   AMS (altered mental status) Active Problems:   Hypothyroidism   ATRIAL FIBRILLATION   Rheumatoid arthritis (HCC)   Diabetes mellitus with renal complications (HCC)   Frequent UTI   Hyperlipidemia   Vascular dementia, uncomplicated (HCC)   Sinusitis    Discharge Condition: Improved.  Diet recommendation: Low sodium, heart healthy.  Carbohydrate-modified.  Wound care: None.  Code status: DNR   History of Present Illness:   [name] is a 87 y.o. female with medical history significant of vascular dementia, hypothyroidism, CKD stage IIIb, hypertension, hyperlipidemia, type 2 diabetes, chronic A. fib on anticoagulation, CAD status post CABG, CHF, history of DVT, GERD, PVD, rheumatoid arthritis on methotrexate, recurrent UTIs on methenamine, acute CVA in February 2022 presented to the ED via EMS from her nursing home for evaluation of altered mental status.  It is reported that she was in her normal state of health at 9 AM this morning and at 10 AM they noticed that her facility that she had drawn both of her arms up to her chest and had a tense muscle tone which lasted about 5 minutes without convulsive activity and she was altered after that episode.  On arrival to ED, patient did not have any focal neurodeficits and was AAO x1.  Blood pressure elevated with systolic in the 170s, remainder of vital signs stable.  Labs showing WBC  7.9, hemoglobin 10.7 (at baseline), platelet count 219K. Sodium 140, potassium 4.1, chloride 103, bicarb 31, BUN 38, creatinine 1.3 (no significant change from baseline), glucose 173.  BNP 454.  UA with negative nitrite, large amount of leukocytes, 11-20 RBCs, greater than 50 WBCs, and few bacteria.  Urine culture pending.  SARS-CoV-2 PCR test pending.  Head CT negative for acute intracranial abnormality.  Chest x-ray showing no active disease. Patient was given Bactrim. Neurology saw the patient and felt that her presentation could be due to potential stroke versus first-time seizure.  They recommended obtaining EEG as well as brain MRI.  Recommended holding antiepileptic medication until after EEG is completed.  Recommended holding Pradaxa until MRI is done.  Blood pressure goal over 170 for now, if MRI is negative for stroke, then goal is normotensive.   Hospital Course by Problem:   Suspected seizures -Keppra 500mg  XR po qhs. -seizure precautions -outpatient neurology follo wup  Possible UTI Patient with history of recurrent UTIs on methenamine. She is endorsing dysuria. UA with negative nitrite, large amount of leukocytes, 11-20 RBCs, greater than 50 WBCs, and few bacteria.No fever, leukocytosis, or signs of sepsis. Prior urine culture from February growing Proteus and E. coli sensitive to Bactrim. She is allergic to cephalosporins and fluoroquinolones. -bactrim seems to be increasing her CR so will treat with fosfomycin  -follow culture  Sinusitis Patient is endorsing frontal headaches. CT showing mucosal thickening of the right maxillary sinus and ethmoid air cells with near complete opacification of the left maxillary sinus. -doxycycline started in ER (patient with multiple allergies)- 5 days treatment  Hypothyroidism -Continue  home Synthroid  Hyperlipidemia -Continue pravastatin  Type 2 diabetes A1c 7.6 on 05/14/2020. -d/c metformin due to renal function -consider  tradjenta if not diet controlled  Chronic A. Fib Currently rate controlled. -resume home meds  Rheumatoid arthritis -Continue methotrexate  Reported chronic diastolic CHF BNP elevated at 454 but chest x-ray clear. She has no peripheral edema. Echo done in February 2022 showing normal LVEF of 55 to 60% and diastolic parameters indeterminate. -decrease demadex dose     Medical Consultants:      Discharge Exam:   Vitals:   07/30/20 2133 07/31/20 0852  BP: (!) 126/55 (!) 131/57  Pulse: 63   Resp: 16   Temp: 98.5 F (36.9 C)   SpO2: 98%    Vitals:   07/30/20 1343 07/30/20 1400 07/30/20 2133 07/31/20 0852  BP: (!) 198/82 (!) 162/83 (!) 126/55 (!) 131/57  Pulse: 92  63   Resp: 19  16   Temp: 97.8 F (36.6 C)  98.5 F (36.9 C)   TempSrc:   Oral   SpO2: 90%  98%   Weight:        General exam: Appears calm and comfortable. .    The results of significant diagnostics from this hospitalization (including imaging, microbiology, ancillary and laboratory) are listed below for reference.     Procedures and Diagnostic Studies:   CT Head Wo Contrast  Result Date: 07/29/2020 CLINICAL DATA:  Altered mental status EXAM: CT HEAD WITHOUT CONTRAST TECHNIQUE: Contiguous axial images were obtained from the base of the skull through the vertex without intravenous contrast. COMPARISON:  Head CT and MRI May 13, 2020 FINDINGS: Brain: No evidence of acute large vascular territory infarction, hemorrhage, hydrocephalus, extra-axial collection or mass lesion/mass effect. Age related global parenchymal volume loss with ex vacuo dilatation of the ventricular system. Similar moderate burden of chronic ischemic small vessel white matter disease. Lacunar type infarcts in the bilateral basal ganglia and right thalamus. Vascular: No hyperdense vessel. Atherosclerotic calcifications of the intracranial portions of the internal carotid and vertebral arteries. Skull: Hyperostosis frontalis.  Negative for fracture or focal lesion. Sinuses/Orbits: Mucosal thickening of the right maxillary sinus and ethmoid air cells with near complete opacification of the left maxillary sinus. Prior lens surgery. Other: None IMPRESSION: 1. No acute intracranial findings. 2. Age-related global parenchymal volume loss and chronic ischemic small vessel white matter disease. Lacunar type infarcts in the bilateral basal ganglia and right thalamus. 3. New paranasal sinus disease as can be seen with sinusitis. Electronically Signed   By: Maudry Mayhew MD   On: 07/29/2020 13:19   MR BRAIN WO CONTRAST  Result Date: 07/29/2020 CLINICAL DATA:  Encephalopathy EXAM: MRI HEAD WITHOUT CONTRAST TECHNIQUE: Multiplanar, multiecho pulse sequences of the brain and surrounding structures were obtained without intravenous contrast. COMPARISON:  None. FINDINGS: Brain: No acute infarct, mass effect or extra-axial collection. Numerous chronic microhemorrhages in a predominantly peripheral distribution. Hyperintense T2-weighted signal is moderately widespread throughout the white matter. Generalized volume loss without a clear lobar predilection. There are multiple old central small vessel infarcts. The midline structures are normal. Vascular: Major flow voids are preserved. Skull and upper cervical spine: Normal calvarium and skull base. Visualized upper cervical spine and soft tissues are normal. Sinuses/Orbits:Bilateral maxillary mucosal thickening. Normal orbits. IMPRESSION: 1. No acute intracranial abnormality. 2. Numerous chronic microhemorrhages in a predominantly peripheral distribution, most consistent with cerebral amyloid angiopathy. 3. Moderate chronic small vessel ischemic disease. Electronically Signed   By: Deatra Robinson M.D.   On:  07/29/2020 23:02   DG Chest Portable 1 View  Result Date: 07/29/2020 CLINICAL DATA:  Altered mental status. EXAM: PORTABLE CHEST 1 VIEW COMPARISON:  May 13, 2020. FINDINGS: Stable  cardiomegaly. Status post coronary bypass graft. No pneumothorax or pleural effusion is noted. No acute pulmonary disease is noted. Bony thorax is unremarkable. IMPRESSION: No active disease. Electronically Signed   By: Lupita Raider M.D.   On: 07/29/2020 12:24   EEG adult  Result Date: 07/30/2020 Charlsie Quest, MD     07/30/2020  8:43 AM Patient Name: [name] MRN: [id number] Epilepsy Attending: Charlsie Quest Referring Physician/Provider: Jimmye Norman, NP Date: 07/29/2020 Duration: 22.31 mins Patient history:  87 yo female who presented today after being found by ALF staff in and out of responsiveness with an episode of tensing of extremities with cortical posturing lasting 5 mins. EEG to evaluate for seizures. Level of alertness: Awake AEDs during EEG study: None Technical aspects: This EEG study was done with scalp electrodes positioned according to the 10-20 International system of electrode placement. Electrical activity was acquired at a sampling rate of  and reviewed with a high frequency filter of  and a low frequency filter of . EEG data were recorded continuously and digitally stored. Description: The posterior dominant rhythm consists of 8 Hz activity of moderate voltage (25-35 uV) seen predominantly in posterior head regions, symmetric and reactive to eye opening and eye closing. EEG showed intermittent generalized 3 to 6 Hz theta-delta slowing. Hyperventilation and photic stimulation were not performed.   ABNORMALITY - Intermittent slow, generalized IMPRESSION: This study is suggestive of mild diffuse encephalopathy, nonspecific etiology. No seizures or epileptiform discharges were seen throughout the recording. Charlsie Quest     Labs:   Basic Metabolic Panel: Recent Labs  Lab 07/29/20 1148 07/30/20 0841 07/31/20 0211  NA 140 141 137  K 4.1 3.8 4.4  CL 103 107 101  CO2 GLUCOSE 173* 184* 120*  BUN 38* 33* 38*  CREATININE 1.38* 1.23* 1.45*   CALCIUM 9.2 9.5 9.7   GFR Estimated Creatinine Clearance: 23.9 mL/min (A) (by C-G formula based on SCr of 1.45 mg/dL (H)). Liver Function Tests: Recent Labs  Lab 07/29/20 1148  AST 18  ALT 13  ALKPHOS 65  BILITOT 0.4  PROT 7.4  ALBUMIN 2.7*   No results for input(s): LIPASE, AMYLASE in the last 168 hours. No results for input(s): AMMONIA in the last 168 hours. Coagulation profile No results for input(s): INR, PROTIME in the last 168 hours.  CBC: Recent Labs  Lab 07/29/20 1431 07/31/20 0211  WBC 7.9 10.7*  NEUTROABS 4.2  --   HGB 10.7* 11.4*  HCT 33.7* 36.3  MCV 101.8* 100.8*  PLT 219 217   Cardiac Enzymes: No results for input(s): CKTOTAL, CKMB, CKMBINDEX, TROPONINI in the last 168 hours. BNP: Invalid input(s): POCBNP CBG: Recent Labs  Lab 07/30/20 0726 07/30/20 1222 07/30/20 1559 07/30/20 2025 07/31/20 0813  GLUCAP 144* 111* 180* 210* 134*   D-Dimer No results for input(s): DDIMER in the last 72 hours. Hgb A1c No results for input(s): HGBA1C in the last 72 hours. Lipid Profile No results for input(s): CHOL, HDL, LDLCALC, TRIG, CHOLHDL, LDLDIRECT in the last 72 hours. Thyroid function studies No results for input(s): TSH, T4TOTAL, T3FREE, THYROIDAB in the last 72 hours.  Invalid input(s): FREET3 Anemia work up No results for input(s): VITAMINB12, FOLATE, FERRITIN, TIBC, IRON, RETICCTPCT in the last 72 hours. Microbiology Recent Results (  from the past 240 hour(s))  Urine culture     Status: None (Preliminary result)   Collection Time: 07/29/20  1:06 PM   Specimen: Urine, Random  Result Value Ref Range Status   Specimen Description URINE, RANDOM  Final   Special Requests NONE  Final   Culture   Final    CULTURE REINCUBATED FOR BETTER GROWTH Performed at [facility], [address], [place], [place] [address]    Report Status PENDING  Incomplete  SARS CORONAVIRUS 2 (TAT 6-24 HRS) Nasopharyngeal Nasopharyngeal Swab     Status: None    Collection Time: 07/30/20  2:59 AM   Specimen: Nasopharyngeal Swab  Result Value Ref Range Status   SARS Coronavirus 2 NEGATIVE NEGATIVE Final    Comment: (NOTE) SARS-CoV-2 target nucleic acids are NOT DETECTED.  The SARS-CoV-2 RNA is generally detectable in upper and lower respiratory specimens during the acute phase of infection. Negative results do not preclude SARS-CoV-2 infection, do not rule out co-infections with other pathogens, and should not be used as the sole basis for treatment or other patient management decisions. Negative results must be combined with clinical observations, patient history, and epidemiological information. The expected result is Negative.  Fact Sheet for Patients: HairSlick.no  Fact Sheet for Healthcare Providers: quierodirigir.com  This test is not yet approved or cleared by the [place] FDA and  has been authorized for detection and/or diagnosis of SARS-CoV-2 by FDA under an Emergency Use Authorization (EUA). This EUA will remain  in effect (meaning this test can be used) for the duration of the COVID-19 declaration under Se ction 564(b)(1) of the Act, 21 U.S.C. section 360bbb-3(b)(1), unless the authorization is terminated or revoked sooner.  Performed at [facility], [address], [place], [place] [address]      Discharge Instructions:   Discharge Instructions    Ambulatory referral to Neurology   Complete by: As directed    An appointment is requested in approximately: 4-6 weeks, post hospital seizures   Diet - low sodium heart healthy   Complete by: As directed    Diet Carb Modified   Complete by: As directed    Increase activity slowly   Complete by: As directed      Allergies as of 07/31/2020      Reactions   Cefuroxime Anaphylaxis, Other (See Comments)   Throat swelling - MAR- Dtr cannot recall this reaction, just rash    Fish Allergy Shortness Of Breath    Pt said she has throat swelling    Ciprofloxacin Rash   Per MAR   Codeine Other (See Comments)   Jittery - Per MAR   Sertraline Hcl Rash   Per MAR   Cephalexin Rash   Per MAR   Paxil [paroxetine Hcl] Other (See Comments)   Delirium while in hospital reports of hallucinations.      Medication List    STOP taking these medications   metFORMIN 500 MG tablet Commonly known as: Glucophage     TAKE these medications   Calcium Carb-Cholecalciferol 600-800 MG-UNIT Tabs TAKE ONE TABLET BY MOUTH 2 TIMES A DAY   Cranberry 500 MG Caps Take 500 mg by mouth daily.   dabigatran 75 MG Caps capsule Commonly known as: PRADAXA Take 1 capsule (75 mg total) by mouth every 12 (twelve) hours.   Dermacloud Oint Apply 1 application topically 2 (two) times daily.   diltiazem 180 MG 24 hr capsule Commonly known as: CARDIZEM CD Take 1  capsule (180 mg total) by mouth at bedtime.   docusate sodium 100 MG capsule Commonly known as: COLACE Take 100 mg by mouth 2 (two) times daily.   doxycycline 100 MG tablet Commonly known as: VIBRA-TABS Take 1 tablet (100 mg total) by mouth every 12 (twelve) hours.   eucerin cream Apply 1 application topically as needed for dry skin.   ferrous sulfate 325 (65 FE) MG tablet Take 325 mg by mouth 2 (two) times daily.   FLUoxetine 10 MG capsule Commonly known as: PROZAC Take 10 mg by mouth daily.   folic acid 1 MG tablet Commonly known as: FOLVITE Take 1 tablet (1 mg total) by mouth daily.   glucose blood test strip Once daily E11.29   guaiFENesin 200 MG tablet Take 200 mg by mouth every 4 (four) hours as needed.   Hemorrhoidal 0.25-14-74.9 % rectal ointment Generic drug: phenylephrine-shark liver oil-mineral oil-petrolatum Place 1 application rectally 2 (two) times daily as needed.   levETIRAcetam 500 MG 24 hr tablet Commonly known as: KEPPRA XR Take 1 tablet (500 mg total) by mouth at bedtime.   levothyroxine 75 MCG tablet Commonly known  as: SYNTHROID Take 75 mcg by mouth daily before breakfast.   loperamide 2 MG tablet Commonly known as: IMODIUM A-D Take 4 mg by mouth See admin instructions. Take 2 capsules (  totally) by mouth after first loose stool and  after subsequent stool; do not exceed  in 24hr   memantine 10 MG tablet Commonly known as: NAMENDA Take 10 mg by mouth at bedtime.   methenamine 0.5 GM tablet Commonly known as: MANDELAMINE Take 1 tablet (500 mg total) by mouth 2 (two) times daily.   methotrexate 10 MG tablet Commonly known as: RHEUMATREX Take 20 mg by mouth every Friday.   metoprolol succinate 25 MG 24 hr tablet Commonly known as: TOPROL-XL Take 25 mg by mouth daily.   mirtazapine 15 MG tablet Commonly known as: REMERON Take 7.5 mg by mouth at bedtime.   polyethylene glycol 17 g packet Commonly known as: MIRALAX / GLYCOLAX Take 17 g by mouth daily.   pravastatin 40 MG tablet Commonly known as: PRAVACHOL TAKE 1 TABLET (40 MG TOTAL) BY MOUTH DAILY. What changed: when to take this   Premarin vaginal cream Generic drug: conjugated estrogens PLACE 1 APPLICATORFUL VAGINALLY ONCE A DAY What changed: See the new instructions.   Probiotic 250 MG Caps Take 250 mg by mouth 2 (two) times daily.   Systane Complete 0.6 % Soln Generic drug: Propylene Glycol Apply 1 drop to eye 4 (four) times daily.   torsemide 100 MG tablet Commonly known as: DEMADEX Take 0.5 tablets (50 mg total) by mouth daily. What changed: when to take this   vitamin B-12 1000 MCG tablet Commonly known as: CYANOCOBALAMIN Take 1,000 mcg by mouth daily.   Vitamin D2 50 MCG (2000 UT) Tabs Take 2,000 Units by mouth daily.         Time coordinating discharge: 35 min  Signed:  Joseph Art DO  [facility] 07/31/2020, 10:35 AM

## 2020-07-31 NOTE — TOC Transition Note (Signed)
Transition of Care [facility]) - CM/SW Discharge Note   Patient Details  Name: [name] MRN: [id number] Date of Birth: 28-Apr-1929  [facility] CM/SW Contact:  Lorri Frederick, LCSW Phone Number: 07/31/2020, 11:28 AM   Clinical Narrative:   Pt discharging to [facility] room 212.  RN call report to [phone].    Final next level of care: Long Term Nursing Home Barriers to Discharge: No Barriers Identified   Patient Goals and CMS Choice Patient states their goals for this hospitalization and ongoing recovery are:: keep pt mobile CMS Medicare.gov Compare Post Acute Care list provided to::  (current resident at [facility].  NA)    Discharge Placement              Patient chooses bed at:  ([facility]) Patient to be transferred to facility by: [facility] Name of family member notified: daughter [name] in room. Patient and family notified of of transfer: 07/31/20  Discharge Plan and Services In-house Referral: Clinical Social Work   Post Acute Care Choice: Resumption of Svcs/PTA Provider                               Social Determinants of Health (SDOH) Interventions     Readmission Risk Interventions No flowsheet data found.

## 2020-08-01 LAB — URINE CULTURE: Culture: 50000 — AB

## 2020-08-02 DIAGNOSIS — M069 Rheumatoid arthritis, unspecified: Secondary | ICD-10-CM | POA: Diagnosis not present

## 2020-08-02 DIAGNOSIS — N184 Chronic kidney disease, stage 4 (severe): Secondary | ICD-10-CM | POA: Diagnosis not present

## 2020-08-02 DIAGNOSIS — M6281 Muscle weakness (generalized): Secondary | ICD-10-CM | POA: Diagnosis not present

## 2020-08-02 DIAGNOSIS — I4891 Unspecified atrial fibrillation: Secondary | ICD-10-CM | POA: Diagnosis not present

## 2020-08-02 DIAGNOSIS — F0150 Vascular dementia without behavioral disturbance: Secondary | ICD-10-CM | POA: Diagnosis not present

## 2020-08-02 DIAGNOSIS — E1122 Type 2 diabetes mellitus with diabetic chronic kidney disease: Secondary | ICD-10-CM | POA: Diagnosis not present

## 2020-08-02 DIAGNOSIS — R2689 Other abnormalities of gait and mobility: Secondary | ICD-10-CM | POA: Diagnosis not present

## 2020-08-02 DIAGNOSIS — I10 Essential (primary) hypertension: Secondary | ICD-10-CM | POA: Diagnosis not present

## 2020-08-02 DIAGNOSIS — K219 Gastro-esophageal reflux disease without esophagitis: Secondary | ICD-10-CM | POA: Diagnosis not present

## 2020-08-03 DIAGNOSIS — E039 Hypothyroidism, unspecified: Secondary | ICD-10-CM | POA: Diagnosis not present

## 2020-08-03 DIAGNOSIS — B001 Herpesviral vesicular dermatitis: Secondary | ICD-10-CM | POA: Diagnosis not present

## 2020-08-03 DIAGNOSIS — I482 Chronic atrial fibrillation, unspecified: Secondary | ICD-10-CM | POA: Diagnosis not present

## 2020-08-03 DIAGNOSIS — E7849 Other hyperlipidemia: Secondary | ICD-10-CM | POA: Diagnosis not present

## 2020-08-03 DIAGNOSIS — D6489 Other specified anemias: Secondary | ICD-10-CM | POA: Diagnosis not present

## 2020-08-03 DIAGNOSIS — E119 Type 2 diabetes mellitus without complications: Secondary | ICD-10-CM | POA: Diagnosis not present

## 2020-08-03 DIAGNOSIS — I5032 Chronic diastolic (congestive) heart failure: Secondary | ICD-10-CM | POA: Diagnosis not present

## 2020-08-03 DIAGNOSIS — I10 Essential (primary) hypertension: Secondary | ICD-10-CM | POA: Diagnosis not present

## 2020-08-03 DIAGNOSIS — F419 Anxiety disorder, unspecified: Secondary | ICD-10-CM | POA: Diagnosis not present

## 2020-08-05 DIAGNOSIS — F411 Generalized anxiety disorder: Secondary | ICD-10-CM | POA: Diagnosis not present

## 2020-08-05 DIAGNOSIS — F0150 Vascular dementia without behavioral disturbance: Secondary | ICD-10-CM | POA: Diagnosis not present

## 2020-08-05 DIAGNOSIS — I5032 Chronic diastolic (congestive) heart failure: Secondary | ICD-10-CM | POA: Diagnosis not present

## 2020-08-05 DIAGNOSIS — N390 Urinary tract infection, site not specified: Secondary | ICD-10-CM | POA: Diagnosis not present

## 2020-08-05 DIAGNOSIS — N183 Chronic kidney disease, stage 3 unspecified: Secondary | ICD-10-CM | POA: Diagnosis not present

## 2020-08-05 DIAGNOSIS — R4701 Aphasia: Secondary | ICD-10-CM | POA: Diagnosis not present

## 2020-08-05 DIAGNOSIS — K219 Gastro-esophageal reflux disease without esophagitis: Secondary | ICD-10-CM | POA: Diagnosis not present

## 2020-08-05 DIAGNOSIS — I482 Chronic atrial fibrillation, unspecified: Secondary | ICD-10-CM | POA: Diagnosis not present

## 2020-08-05 DIAGNOSIS — N184 Chronic kidney disease, stage 4 (severe): Secondary | ICD-10-CM | POA: Diagnosis not present

## 2020-08-05 DIAGNOSIS — N3020 Other chronic cystitis without hematuria: Secondary | ICD-10-CM | POA: Diagnosis not present

## 2020-08-05 DIAGNOSIS — I4891 Unspecified atrial fibrillation: Secondary | ICD-10-CM | POA: Diagnosis not present

## 2020-08-05 DIAGNOSIS — F0390 Unspecified dementia without behavioral disturbance: Secondary | ICD-10-CM | POA: Diagnosis not present

## 2020-08-05 DIAGNOSIS — M6281 Muscle weakness (generalized): Secondary | ICD-10-CM | POA: Diagnosis not present

## 2020-08-05 DIAGNOSIS — I639 Cerebral infarction, unspecified: Secondary | ICD-10-CM | POA: Diagnosis not present

## 2020-08-05 DIAGNOSIS — M069 Rheumatoid arthritis, unspecified: Secondary | ICD-10-CM | POA: Diagnosis not present

## 2020-08-05 DIAGNOSIS — R2689 Other abnormalities of gait and mobility: Secondary | ICD-10-CM | POA: Diagnosis not present

## 2020-08-05 DIAGNOSIS — I10 Essential (primary) hypertension: Secondary | ICD-10-CM | POA: Diagnosis not present

## 2020-08-05 DIAGNOSIS — R569 Unspecified convulsions: Secondary | ICD-10-CM | POA: Diagnosis not present

## 2020-08-05 DIAGNOSIS — E1122 Type 2 diabetes mellitus with diabetic chronic kidney disease: Secondary | ICD-10-CM | POA: Diagnosis not present

## 2020-08-06 DIAGNOSIS — L602 Onychogryphosis: Secondary | ICD-10-CM | POA: Diagnosis not present

## 2020-08-06 DIAGNOSIS — E08621 Diabetes mellitus due to underlying condition with foot ulcer: Secondary | ICD-10-CM | POA: Diagnosis not present

## 2020-08-06 DIAGNOSIS — E1142 Type 2 diabetes mellitus with diabetic polyneuropathy: Secondary | ICD-10-CM | POA: Diagnosis not present

## 2020-08-06 DIAGNOSIS — L97421 Non-pressure chronic ulcer of left heel and midfoot limited to breakdown of skin: Secondary | ICD-10-CM | POA: Diagnosis not present

## 2020-08-07 DIAGNOSIS — D649 Anemia, unspecified: Secondary | ICD-10-CM | POA: Diagnosis not present

## 2020-08-07 DIAGNOSIS — E119 Type 2 diabetes mellitus without complications: Secondary | ICD-10-CM | POA: Diagnosis not present

## 2020-08-08 DIAGNOSIS — I10 Essential (primary) hypertension: Secondary | ICD-10-CM | POA: Diagnosis not present

## 2020-08-08 DIAGNOSIS — K219 Gastro-esophageal reflux disease without esophagitis: Secondary | ICD-10-CM | POA: Diagnosis not present

## 2020-08-08 DIAGNOSIS — F0150 Vascular dementia without behavioral disturbance: Secondary | ICD-10-CM | POA: Diagnosis not present

## 2020-08-08 DIAGNOSIS — I4891 Unspecified atrial fibrillation: Secondary | ICD-10-CM | POA: Diagnosis not present

## 2020-08-08 DIAGNOSIS — E1122 Type 2 diabetes mellitus with diabetic chronic kidney disease: Secondary | ICD-10-CM | POA: Diagnosis not present

## 2020-08-08 DIAGNOSIS — R2689 Other abnormalities of gait and mobility: Secondary | ICD-10-CM | POA: Diagnosis not present

## 2020-08-08 DIAGNOSIS — M069 Rheumatoid arthritis, unspecified: Secondary | ICD-10-CM | POA: Diagnosis not present

## 2020-08-08 DIAGNOSIS — M6281 Muscle weakness (generalized): Secondary | ICD-10-CM | POA: Diagnosis not present

## 2020-08-08 DIAGNOSIS — N184 Chronic kidney disease, stage 4 (severe): Secondary | ICD-10-CM | POA: Diagnosis not present

## 2020-08-10 DIAGNOSIS — I482 Chronic atrial fibrillation, unspecified: Secondary | ICD-10-CM | POA: Diagnosis not present

## 2020-08-10 DIAGNOSIS — I5032 Chronic diastolic (congestive) heart failure: Secondary | ICD-10-CM | POA: Diagnosis not present

## 2020-08-10 DIAGNOSIS — I10 Essential (primary) hypertension: Secondary | ICD-10-CM | POA: Diagnosis not present

## 2020-08-10 DIAGNOSIS — F329 Major depressive disorder, single episode, unspecified: Secondary | ICD-10-CM | POA: Diagnosis not present

## 2020-08-10 DIAGNOSIS — F0390 Unspecified dementia without behavioral disturbance: Secondary | ICD-10-CM | POA: Diagnosis not present

## 2020-08-11 DIAGNOSIS — I739 Peripheral vascular disease, unspecified: Secondary | ICD-10-CM | POA: Diagnosis not present

## 2020-08-11 DIAGNOSIS — K219 Gastro-esophageal reflux disease without esophagitis: Secondary | ICD-10-CM | POA: Diagnosis not present

## 2020-08-11 DIAGNOSIS — M069 Rheumatoid arthritis, unspecified: Secondary | ICD-10-CM | POA: Diagnosis not present

## 2020-08-11 DIAGNOSIS — F0150 Vascular dementia without behavioral disturbance: Secondary | ICD-10-CM | POA: Diagnosis not present

## 2020-08-11 DIAGNOSIS — E1151 Type 2 diabetes mellitus with diabetic peripheral angiopathy without gangrene: Secondary | ICD-10-CM | POA: Diagnosis not present

## 2020-08-11 DIAGNOSIS — I10 Essential (primary) hypertension: Secondary | ICD-10-CM | POA: Diagnosis not present

## 2020-08-11 DIAGNOSIS — E1122 Type 2 diabetes mellitus with diabetic chronic kidney disease: Secondary | ICD-10-CM | POA: Diagnosis not present

## 2020-08-11 DIAGNOSIS — I639 Cerebral infarction, unspecified: Secondary | ICD-10-CM | POA: Diagnosis not present

## 2020-08-11 DIAGNOSIS — I482 Chronic atrial fibrillation, unspecified: Secondary | ICD-10-CM | POA: Diagnosis not present

## 2020-08-12 DIAGNOSIS — I639 Cerebral infarction, unspecified: Secondary | ICD-10-CM | POA: Diagnosis not present

## 2020-08-12 DIAGNOSIS — I482 Chronic atrial fibrillation, unspecified: Secondary | ICD-10-CM | POA: Diagnosis not present

## 2020-08-12 DIAGNOSIS — K219 Gastro-esophageal reflux disease without esophagitis: Secondary | ICD-10-CM | POA: Diagnosis not present

## 2020-08-12 DIAGNOSIS — E1151 Type 2 diabetes mellitus with diabetic peripheral angiopathy without gangrene: Secondary | ICD-10-CM | POA: Diagnosis not present

## 2020-08-12 DIAGNOSIS — I739 Peripheral vascular disease, unspecified: Secondary | ICD-10-CM | POA: Diagnosis not present

## 2020-08-12 DIAGNOSIS — F0150 Vascular dementia without behavioral disturbance: Secondary | ICD-10-CM | POA: Diagnosis not present

## 2020-08-12 DIAGNOSIS — M069 Rheumatoid arthritis, unspecified: Secondary | ICD-10-CM | POA: Diagnosis not present

## 2020-08-12 DIAGNOSIS — I10 Essential (primary) hypertension: Secondary | ICD-10-CM | POA: Diagnosis not present

## 2020-08-12 DIAGNOSIS — E1122 Type 2 diabetes mellitus with diabetic chronic kidney disease: Secondary | ICD-10-CM | POA: Diagnosis not present

## 2020-08-13 DIAGNOSIS — I639 Cerebral infarction, unspecified: Secondary | ICD-10-CM | POA: Diagnosis not present

## 2020-08-13 DIAGNOSIS — M069 Rheumatoid arthritis, unspecified: Secondary | ICD-10-CM | POA: Diagnosis not present

## 2020-08-13 DIAGNOSIS — K219 Gastro-esophageal reflux disease without esophagitis: Secondary | ICD-10-CM | POA: Diagnosis not present

## 2020-08-13 DIAGNOSIS — I10 Essential (primary) hypertension: Secondary | ICD-10-CM | POA: Diagnosis not present

## 2020-08-13 DIAGNOSIS — I482 Chronic atrial fibrillation, unspecified: Secondary | ICD-10-CM | POA: Diagnosis not present

## 2020-08-13 DIAGNOSIS — F0150 Vascular dementia without behavioral disturbance: Secondary | ICD-10-CM | POA: Diagnosis not present

## 2020-08-13 DIAGNOSIS — I739 Peripheral vascular disease, unspecified: Secondary | ICD-10-CM | POA: Diagnosis not present

## 2020-08-13 DIAGNOSIS — E1122 Type 2 diabetes mellitus with diabetic chronic kidney disease: Secondary | ICD-10-CM | POA: Diagnosis not present

## 2020-08-13 DIAGNOSIS — E1151 Type 2 diabetes mellitus with diabetic peripheral angiopathy without gangrene: Secondary | ICD-10-CM | POA: Diagnosis not present

## 2020-08-14 DIAGNOSIS — I639 Cerebral infarction, unspecified: Secondary | ICD-10-CM | POA: Diagnosis not present

## 2020-08-14 DIAGNOSIS — E1122 Type 2 diabetes mellitus with diabetic chronic kidney disease: Secondary | ICD-10-CM | POA: Diagnosis not present

## 2020-08-14 DIAGNOSIS — K219 Gastro-esophageal reflux disease without esophagitis: Secondary | ICD-10-CM | POA: Diagnosis not present

## 2020-08-14 DIAGNOSIS — E1151 Type 2 diabetes mellitus with diabetic peripheral angiopathy without gangrene: Secondary | ICD-10-CM | POA: Diagnosis not present

## 2020-08-14 DIAGNOSIS — I482 Chronic atrial fibrillation, unspecified: Secondary | ICD-10-CM | POA: Diagnosis not present

## 2020-08-14 DIAGNOSIS — M069 Rheumatoid arthritis, unspecified: Secondary | ICD-10-CM | POA: Diagnosis not present

## 2020-08-14 DIAGNOSIS — I739 Peripheral vascular disease, unspecified: Secondary | ICD-10-CM | POA: Diagnosis not present

## 2020-08-14 DIAGNOSIS — F0150 Vascular dementia without behavioral disturbance: Secondary | ICD-10-CM | POA: Diagnosis not present

## 2020-08-14 DIAGNOSIS — I10 Essential (primary) hypertension: Secondary | ICD-10-CM | POA: Diagnosis not present

## 2020-08-15 DIAGNOSIS — N390 Urinary tract infection, site not specified: Secondary | ICD-10-CM | POA: Diagnosis not present

## 2020-08-17 ENCOUNTER — Encounter: Payer: Self-pay | Admitting: Diagnostic Neuroimaging

## 2020-08-17 ENCOUNTER — Ambulatory Visit (INDEPENDENT_AMBULATORY_CARE_PROVIDER_SITE_OTHER): Payer: Medicare PPO | Admitting: Diagnostic Neuroimaging

## 2020-08-17 ENCOUNTER — Other Ambulatory Visit: Payer: Self-pay

## 2020-08-17 VITALS — BP 117/68 | HR 54

## 2020-08-17 DIAGNOSIS — F0390 Unspecified dementia without behavioral disturbance: Secondary | ICD-10-CM | POA: Diagnosis not present

## 2020-08-17 DIAGNOSIS — I5032 Chronic diastolic (congestive) heart failure: Secondary | ICD-10-CM | POA: Diagnosis not present

## 2020-08-17 DIAGNOSIS — R569 Unspecified convulsions: Secondary | ICD-10-CM | POA: Diagnosis not present

## 2020-08-17 DIAGNOSIS — I482 Chronic atrial fibrillation, unspecified: Secondary | ICD-10-CM | POA: Diagnosis not present

## 2020-08-17 DIAGNOSIS — I69398 Other sequelae of cerebral infarction: Secondary | ICD-10-CM | POA: Diagnosis not present

## 2020-08-17 DIAGNOSIS — N3020 Other chronic cystitis without hematuria: Secondary | ICD-10-CM | POA: Diagnosis not present

## 2020-08-17 DIAGNOSIS — M069 Rheumatoid arthritis, unspecified: Secondary | ICD-10-CM | POA: Diagnosis not present

## 2020-08-17 NOTE — Patient Instructions (Signed)
POST-STROKE SEIZURE (07/29/20) - continue levetiracetam XR 500mg  at bedtime  STROKE PREVENTION - continue pradaxa, BP control, DM control, statin

## 2020-08-17 NOTE — Progress Notes (Signed)
[facility]  PATIENT: [name] DOB: 08/24/1929  REFERRING CLINICIAN: Leda [name], * HISTORY FROM: PATIENT  REASON FOR VISIT: NEW CONSULT    HISTORICAL  CHIEF COMPLAINT:  Chief Complaint  Patient presents with  . Seizures    Rm 7 New Pt, hospital FU for seizures, son [name]    HISTORY OF PRESENT ILLNESS:   87 year old female here for evaluation of seizure.  History of vascular dementia, hypertension, hyperlipidemia, diabetes, atrial fibrillation, coronary artery disease, rheumatoid arthritis, had stroke in February 2022.  In April 2022 patient had episode of seizure-like activity at nursing home.  She was in the hospital for evaluation.  No acute stroke was found on this evaluation.  She was treated empirically for seizures.  Since that time she is doing well.     REVIEW OF SYSTEMS: Full 14 system review of systems performed and negative with exception of: as per HPI.  ALLERGIES: Allergies  Allergen Reactions  . Cefuroxime Anaphylaxis and Other (See Comments)    Throat swelling - MAR- Dtr cannot recall this reaction, just rash   . Fish Allergy Shortness Of Breath    Pt said she has throat swelling   . Ciprofloxacin Rash    Per MAR  . Codeine Other (See Comments)    Jittery - Per MAR  . Sertraline Hcl Rash    Per MAR  . Cephalexin Rash    Per MAR  . Paxil [Paroxetine Hcl] Other (See Comments)    Delirium while in hospital reports of hallucinations.    HOME MEDICATIONS: Outpatient Medications Prior to Visit  Medication Sig Dispense Refill  . bacitracin 500 UNIT/GM ointment Apply topically.    . Calcium Carb-Cholecalciferol 600-800 MG-UNIT TABS TAKE ONE TABLET BY MOUTH 2 TIMES A DAY (Patient taking differently: Take 1 tablet by mouth 2 (two) times daily.) 60 tablet 6  . Cranberry 500 MG CAPS Take 500 mg by mouth daily.     . dabigatran (PRADAXA) 75 MG CAPS capsule Take 1 capsule (75 mg total) by mouth every 12 (twelve) hours. 60  capsule   . diltiazem (CARDIZEM CD) 180 MG 24 hr capsule Take 1 capsule (180 mg total) by mouth at bedtime. 90 capsule 3  . docusate sodium (COLACE) 100 MG capsule Take 100 mg by mouth 2 (two) times daily.    . Ergocalciferol (VITAMIN D2) 2000 units TABS Take 2,000 Units by mouth daily.     . ferrous sulfate 325 (65 FE) MG tablet Take 325 mg by mouth 2 (two) times daily.     Marland Kitchen FLUoxetine (PROZAC) 10 MG capsule Take 10 mg by mouth daily.    . folic acid (FOLVITE) 1 MG tablet Take 1 tablet (1 mg total) by mouth daily. 90 tablet 3  . glucose blood test strip Once daily E11.29 100 each 12  . guaiFENesin 200 MG tablet Take 200 mg by mouth every 4 (four) hours as needed.    Marland Kitchen HEMORRHOIDAL 0.25-14-74.9 % rectal ointment Place 1 application rectally 2 (two) times daily as needed.    . Infant Care Products (DERMACLOUD) OINT Apply 1 application topically 2 (two) times daily.    Marland Kitchen levETIRAcetam (KEPPRA XR) 500 MG 24 hr tablet Take 1 tablet (500 mg total) by mouth at bedtime.    Marland Kitchen levothyroxine (SYNTHROID) 75 MCG tablet Take 75 mcg by mouth daily before breakfast.    . loperamide (IMODIUM A-D) 2 MG tablet Take 4 mg by mouth See admin instructions. Take 2 capsules (  4mg  totally) by mouth after first loose stool and 2mg  after subsequent stool; do not exceed 16mg  in 24hr    . memantine (NAMENDA) 10 MG tablet Take 10 mg by mouth at bedtime.    . methenamine (MANDELAMINE) 0.5 GM tablet Take 1 tablet (500 mg total) by mouth 2 (two) times daily.    . metoprolol succinate (TOPROL-XL) 25 MG 24 hr tablet Take 25 mg by mouth daily.    . mirtazapine (REMERON) 15 MG tablet Take 7.5 mg by mouth at bedtime.    . polyethylene glycol (MIRALAX / GLYCOLAX) 17 g packet Take 17 g by mouth daily.    . pravastatin (PRAVACHOL) 40 MG tablet TAKE 1 TABLET (40 MG TOTAL) BY MOUTH DAILY. (Patient taking differently: Take 40 mg by mouth at bedtime.) 90 tablet 1  . PREMARIN vaginal cream PLACE 1 APPLICATORFUL VAGINALLY ONCE A DAY (Patient  taking differently: Place 0.5 g vaginally daily.) 30 g 11  . Propylene Glycol (SYSTANE COMPLETE) 0.6 % SOLN Apply 1 drop to eye 4 (four) times daily.    . Skin Protectants, Misc. (EUCERIN) cream Apply 1 application topically as needed for dry skin.    vitamin B-12 (CYANOCOBALAMIN) 1000 MCG tablet Take 1,000 mcg by mouth daily.    . vitamin C (ASCORBIC ACID) 500 MG tablet Take 500 mg by mouth daily.    doxycycline (VIBRA-TABS) 100 MG tablet Take 1 tablet (100 mg total) by mouth every 12 (twelve) hours. (Patient not taking: Reported on 08/17/2020) 8 tablet 0  . methotrexate (RHEUMATREX) 10 MG tablet Take 20 mg by mouth every Friday. (Patient not taking: Reported on 08/17/2020)    . Saccharomyces boulardii (PROBIOTIC) 250 MG CAPS Take 250 mg by mouth 2 (two) times daily.    10/17/2020 torsemide (DEMADEX) 100 MG tablet Take 0.5 tablets (50 mg total) by mouth daily. (Patient not taking: Reported on 08/17/2020) 180 tablet 1  . levETIRAcetam (KEPPRA) 500 MG tablet Take by mouth at bedtime.     No facility-administered medications prior to visit.    PAST MEDICAL HISTORY: Past Medical History:  Diagnosis Date  . Acute pancreatitis   . Anemia   . Anxiety and depression   . Arthritis    RA  . Atrial fibrillation (HCC)   . CAD (coronary artery disease)   . Carotid artery occlusion   . Cerebral infarction (HCC)   . CHF (congestive heart failure) (HCC)   . CKD (chronic kidney disease)   . Dementia (HCC)   . Diabetes mellitus age 67  . DJD (degenerative joint disease)   . DVT (deep venous thrombosis) (HCC)   . GERD (gastroesophageal reflux disease)   . GI bleed   . Hyperlipidemia   . Hypertension   . Hypothyroid   . Joint pain   . Mesenteric ischemia   . Muscle weakness (generalized)   . PVD (peripheral vascular disease) (HCC)   . Renal disorder    Stage IV  . Rheumatoid arthritis (HCC)   . Thyroid disease   . Vascular dementia (HCC)     PAST SURGICAL HISTORY: Past Surgical History:   Procedure Laterality Date  . APPENDECTOMY    . CELIAC ARTERY STENT    . CORONARY ARTERY BYPASS GRAFT    . EMBOLECTOMY  2009   right leg  . FASCIOTOMY     right leg  . HIP FRACTURE SURGERY Left   . JOINT REPLACEMENT    . TOTAL KNEE ARTHROPLASTY     bilateral  .  TUBAL LIGATION    . VISCERAL ANGIOGRAM N/A 05/31/2013   Procedure: MESSENTERIC [name];  Surgeon: [name], MD;  Location: [facility] CATH LAB;  Service: Cardiovascular;  Laterality: N/A;    FAMILY HISTORY: Family History  Problem Relation Age of Onset  . Cancer Mother        ovarian  . Other Father        suicide  . Coronary artery disease Sister   . Other Sister        alzheimers  . Heart disease Sister        Before age 19  . Diabetes Sister   . Cancer Daughter        spinal tumor  . Diabetes Daughter   . Hypertension Daughter   . Diabetes Son   . Hyperlipidemia Son   . Hypertension Son     SOCIAL HISTORY: Social History   Socioeconomic History  . Marital status: Widowed    Spouse name: Not on file  . Number of children: Not on file  . Years of education: Not on file  . Highest education level: Not on file  Occupational History  . Not on file  Tobacco Use  . Smoking status: Never Smoker  . Smokeless tobacco: Never Used  Vaping Use  . Vaping Use: Never used  Substance and Sexual Activity  . Alcohol use: No  . Drug use: No  . Sexual activity: Not on file  Other Topics Concern  . Not on file  Social History Narrative   08/17/20 at [facility]   Social Determinants of Health   Financial Resource Strain: Not on file  Food Insecurity: Not on file  Transportation Needs: Not on file  Physical Activity: Not on file  Stress: Not on file  Social Connections: Not on file  Intimate Partner Violence: Not on file     PHYSICAL EXAM  GENERAL EXAM/CONSTITUTIONAL: Vitals:  Vitals:   08/17/20 1039  BP: 117/68  Pulse: (!) 54     There is no height or weight on file to  calculate BMI. Wt Readings from Last 3 Encounters:  07/30/20 145 lb (65.8 kg)  05/13/20 147 lb 0.8 oz (66.7 kg)  02/11/18 147 lb (66.7 kg)     Patient is in no distress; well developed, nourished and groomed; neck is supple  CARDIOVASCULAR:  Examination of carotid arteries is normal; no carotid bruits  IRREGULAR RHYTHM --> BLOWING SYSTOLIC MURMUR  Examination of peripheral vascular system by observation and palpation is normal  EYES:  Ophthalmoscopic exam of optic discs and posterior segments is normal; no papilledema or hemorrhages  No exam data present  MUSCULOSKELETAL:  Gait, strength, tone, movements noted in Neurologic exam below  NEUROLOGIC: MENTAL STATUS:  No flowsheet data found.  awake, alert, oriented to person, place and time  recent and remote memory intact  normal attention and concentration  language fluent, comprehension intact, naming intact  fund of knowledge appropriate  CRANIAL NERVE:   2nd - no papilledema on fundoscopic exam  2nd, 3rd, 4th, 6th - pupils equal and reactive to light, visual fields full to confrontation, extraocular muscles intact, no nystagmus  5th - facial sensation symmetric  7th - facial strength symmetric  8th - hearing intact  9th - palate elevates symmetrically, uvula midline  11th - shoulder shrug symmetric  12th - tongue protrusion midline  MOTOR:   normal bulk and tone  LIMITED IN BUE DUE TO SHOULDER ROM LIMITATION (RHEUMATOID ARTHRITIS)  DIFFUSE 4/5  SENSORY:   normal and symmetric to light touch, temperature, vibration  COORDINATION:   finger-nose-finger, fine finger movements normal  REFLEXES:   deep tendon reflexes TRACE and symmetric  GAIT/STATION:   IN WHEELCHAIR     DIAGNOSTIC DATA (LABS, IMAGING, TESTING) - I reviewed patient records, labs, notes, testing and imaging myself where available.  Lab Results  Component Value Date   WBC 10.7 (H) 07/31/2020   HGB 11.4 (L)  07/31/2020   HCT 36.3 07/31/2020   MCV 100.8 (H) 07/31/2020   PLT 217 07/31/2020      Component Value Date/Time   NA 137 07/31/2020 0211   NA 138 12/12/2017 0000   K 4.4 07/31/2020 0211   CL 101 07/31/2020 0211   CO2 29 07/31/2020 0211   GLUCOSE 120 (H) 07/31/2020 0211   BUN 38 (H) 07/31/2020 0211   BUN 21 12/12/2017 0000   CREATININE 1.45 (H) 07/31/2020 0211   CREATININE 1.49 (H) 09/21/2017 1549   CALCIUM 9.7 07/31/2020 0211   PROT 7.4 07/29/2020 1148   ALBUMIN 2.7 (L) 07/29/2020 1148   ALBUMIN 3.8 02/13/2015 0000   AST 18 07/29/2020 1148   ALT 13 07/29/2020 1148   ALKPHOS 65 07/29/2020 1148   BILITOT 0.4 07/29/2020 1148   GFRNONAA 35 (L) 07/31/2020 0211   GFRNONAA 32 (L) 09/21/2017 1549   GFRAA 57 (L) 05/19/2019 1734   GFRAA 37 (L) 09/21/2017 1549   Lab Results  Component Value Date   CHOL 99 05/14/2020   HDL 27 (L) 05/14/2020   LDLCALC 57 05/14/2020   TRIG 74 05/14/2020   CHOLHDL 3.7 05/14/2020   Lab Results  Component Value Date   HGBA1C 7.6 (H) 05/14/2020   Lab Results  Component Value Date   VITAMINB12 1,684 (H) 09/21/2017   Lab Results  Component Value Date   TSH 1.702 05/13/2020    05/14/2020 EEG - This study is within normal limits. No seizures or epileptiform discharges were seen throughout the recording.   05/14/20 TTE 1. Left ventricular ejection fraction, by estimation, is 55 to 60%. The  left ventricle has normal function. The left ventricle has no regional  wall motion abnormalities. There is moderate to severe left ventricular  hypertrophy. Left ventricular  diastolic parameters are indeterminate.  2. Right ventricular systolic function is normal. The right ventricular  size is normal. There is mildly elevated pulmonary artery systolic  pressure.  3. Left atrial size was severely dilated.  4. Right atrial size was mildly dilated.  5. The mitral valve is abnormal. Moderate mitral valve regurgitation.  Moderate to severe mitral annular  calcification.  6. Tricuspid valve regurgitation is mild to moderate.  7. AV is thickened, calcified with restricted motion Peak and mean  gradients through the valve are 57 and 38 mm Hg respectively THis is  consistent with moderate/severe to severe AS Dimensionless index is 0.15  consistent with critical AS COmpared to echo  images from 2018, AV is thicker, more calcified and the gradients are  increased. . Aortic valve regurgitation is mild.   07/30/2020 EEG - This study is suggestive of mild diffuse encephalopathy, nonspecific etiology. No seizures or epileptiform discharges were seen throughout the recording  07/29/2020 MRI brain 1. No acute intracranial abnormality. 2. Numerous chronic microhemorrhages in a predominantly peripheral distribution, most consistent with cerebral amyloid angiopathy. 3. Moderate chronic small vessel ischemic disease.    ASSESSMENT AND PLAN  87 y.o. year old female here with:  Dx:  1. Seizure, late effect of  stroke (HCC)      PLAN:  POST-STROKE SEIZURE (07/29/20) - continue levetiracetam XR 500mg  at bedtime  STROKE PREVENTION - continue pradaxa, BP control, DM control, statin  Return for return to PCP, pending if symptoms worsen or fail to improve.    Suanne Marker, MD 08/17/2020, 11:08 AM Certified in Neurology, Neurophysiology and Neuroimaging  [facility] [address], [address] [place], [place] [address] [phone]

## 2020-08-18 DIAGNOSIS — G4700 Insomnia, unspecified: Secondary | ICD-10-CM | POA: Diagnosis not present

## 2020-08-18 DIAGNOSIS — F331 Major depressive disorder, recurrent, moderate: Secondary | ICD-10-CM | POA: Diagnosis not present

## 2020-08-18 DIAGNOSIS — K219 Gastro-esophageal reflux disease without esophagitis: Secondary | ICD-10-CM | POA: Diagnosis not present

## 2020-08-18 DIAGNOSIS — I10 Essential (primary) hypertension: Secondary | ICD-10-CM | POA: Diagnosis not present

## 2020-08-18 DIAGNOSIS — I639 Cerebral infarction, unspecified: Secondary | ICD-10-CM | POA: Diagnosis not present

## 2020-08-18 DIAGNOSIS — G301 Alzheimer's disease with late onset: Secondary | ICD-10-CM | POA: Diagnosis not present

## 2020-08-18 DIAGNOSIS — M069 Rheumatoid arthritis, unspecified: Secondary | ICD-10-CM | POA: Diagnosis not present

## 2020-08-18 DIAGNOSIS — E1151 Type 2 diabetes mellitus with diabetic peripheral angiopathy without gangrene: Secondary | ICD-10-CM | POA: Diagnosis not present

## 2020-08-18 DIAGNOSIS — I739 Peripheral vascular disease, unspecified: Secondary | ICD-10-CM | POA: Diagnosis not present

## 2020-08-18 DIAGNOSIS — E1122 Type 2 diabetes mellitus with diabetic chronic kidney disease: Secondary | ICD-10-CM | POA: Diagnosis not present

## 2020-08-18 DIAGNOSIS — I482 Chronic atrial fibrillation, unspecified: Secondary | ICD-10-CM | POA: Diagnosis not present

## 2020-08-18 DIAGNOSIS — F0150 Vascular dementia without behavioral disturbance: Secondary | ICD-10-CM | POA: Diagnosis not present

## 2020-08-18 DIAGNOSIS — F0151 Vascular dementia with behavioral disturbance: Secondary | ICD-10-CM | POA: Diagnosis not present

## 2020-08-19 DIAGNOSIS — I10 Essential (primary) hypertension: Secondary | ICD-10-CM | POA: Diagnosis not present

## 2020-08-19 DIAGNOSIS — I482 Chronic atrial fibrillation, unspecified: Secondary | ICD-10-CM | POA: Diagnosis not present

## 2020-08-19 DIAGNOSIS — E1122 Type 2 diabetes mellitus with diabetic chronic kidney disease: Secondary | ICD-10-CM | POA: Diagnosis not present

## 2020-08-19 DIAGNOSIS — I739 Peripheral vascular disease, unspecified: Secondary | ICD-10-CM | POA: Diagnosis not present

## 2020-08-19 DIAGNOSIS — I639 Cerebral infarction, unspecified: Secondary | ICD-10-CM | POA: Diagnosis not present

## 2020-08-19 DIAGNOSIS — E1151 Type 2 diabetes mellitus with diabetic peripheral angiopathy without gangrene: Secondary | ICD-10-CM | POA: Diagnosis not present

## 2020-08-19 DIAGNOSIS — M069 Rheumatoid arthritis, unspecified: Secondary | ICD-10-CM | POA: Diagnosis not present

## 2020-08-19 DIAGNOSIS — K219 Gastro-esophageal reflux disease without esophagitis: Secondary | ICD-10-CM | POA: Diagnosis not present

## 2020-08-19 DIAGNOSIS — F0150 Vascular dementia without behavioral disturbance: Secondary | ICD-10-CM | POA: Diagnosis not present

## 2020-08-20 DIAGNOSIS — I482 Chronic atrial fibrillation, unspecified: Secondary | ICD-10-CM | POA: Diagnosis not present

## 2020-08-20 DIAGNOSIS — I10 Essential (primary) hypertension: Secondary | ICD-10-CM | POA: Diagnosis not present

## 2020-08-20 DIAGNOSIS — I639 Cerebral infarction, unspecified: Secondary | ICD-10-CM | POA: Diagnosis not present

## 2020-08-20 DIAGNOSIS — K219 Gastro-esophageal reflux disease without esophagitis: Secondary | ICD-10-CM | POA: Diagnosis not present

## 2020-08-20 DIAGNOSIS — E1122 Type 2 diabetes mellitus with diabetic chronic kidney disease: Secondary | ICD-10-CM | POA: Diagnosis not present

## 2020-08-20 DIAGNOSIS — F0150 Vascular dementia without behavioral disturbance: Secondary | ICD-10-CM | POA: Diagnosis not present

## 2020-08-20 DIAGNOSIS — I739 Peripheral vascular disease, unspecified: Secondary | ICD-10-CM | POA: Diagnosis not present

## 2020-08-20 DIAGNOSIS — E1151 Type 2 diabetes mellitus with diabetic peripheral angiopathy without gangrene: Secondary | ICD-10-CM | POA: Diagnosis not present

## 2020-08-20 DIAGNOSIS — M069 Rheumatoid arthritis, unspecified: Secondary | ICD-10-CM | POA: Diagnosis not present

## 2020-08-21 DIAGNOSIS — I482 Chronic atrial fibrillation, unspecified: Secondary | ICD-10-CM | POA: Diagnosis not present

## 2020-08-21 DIAGNOSIS — N3020 Other chronic cystitis without hematuria: Secondary | ICD-10-CM | POA: Diagnosis not present

## 2020-08-21 DIAGNOSIS — E1151 Type 2 diabetes mellitus with diabetic peripheral angiopathy without gangrene: Secondary | ICD-10-CM | POA: Diagnosis not present

## 2020-08-21 DIAGNOSIS — I10 Essential (primary) hypertension: Secondary | ICD-10-CM | POA: Diagnosis not present

## 2020-08-21 DIAGNOSIS — K219 Gastro-esophageal reflux disease without esophagitis: Secondary | ICD-10-CM | POA: Diagnosis not present

## 2020-08-21 DIAGNOSIS — I639 Cerebral infarction, unspecified: Secondary | ICD-10-CM | POA: Diagnosis not present

## 2020-08-21 DIAGNOSIS — I739 Peripheral vascular disease, unspecified: Secondary | ICD-10-CM | POA: Diagnosis not present

## 2020-08-21 DIAGNOSIS — I5032 Chronic diastolic (congestive) heart failure: Secondary | ICD-10-CM | POA: Diagnosis not present

## 2020-08-21 DIAGNOSIS — F0150 Vascular dementia without behavioral disturbance: Secondary | ICD-10-CM | POA: Diagnosis not present

## 2020-08-21 DIAGNOSIS — F0390 Unspecified dementia without behavioral disturbance: Secondary | ICD-10-CM | POA: Diagnosis not present

## 2020-08-21 DIAGNOSIS — E1122 Type 2 diabetes mellitus with diabetic chronic kidney disease: Secondary | ICD-10-CM | POA: Diagnosis not present

## 2020-08-21 DIAGNOSIS — M069 Rheumatoid arthritis, unspecified: Secondary | ICD-10-CM | POA: Diagnosis not present

## 2020-08-24 ENCOUNTER — Telehealth: Payer: Self-pay | Admitting: Diagnostic Neuroimaging

## 2020-08-24 DIAGNOSIS — I482 Chronic atrial fibrillation, unspecified: Secondary | ICD-10-CM | POA: Diagnosis not present

## 2020-08-24 DIAGNOSIS — N3020 Other chronic cystitis without hematuria: Secondary | ICD-10-CM | POA: Diagnosis not present

## 2020-08-24 DIAGNOSIS — F0390 Unspecified dementia without behavioral disturbance: Secondary | ICD-10-CM | POA: Diagnosis not present

## 2020-08-24 DIAGNOSIS — I5032 Chronic diastolic (congestive) heart failure: Secondary | ICD-10-CM | POA: Diagnosis not present

## 2020-08-24 DIAGNOSIS — R569 Unspecified convulsions: Secondary | ICD-10-CM | POA: Diagnosis not present

## 2020-08-24 DIAGNOSIS — F329 Major depressive disorder, single episode, unspecified: Secondary | ICD-10-CM | POA: Diagnosis not present

## 2020-08-24 NOTE — Telephone Encounter (Signed)
Pt's daughter is asking for a call from RN to discuss pt's dose of Keppra.  Daughter feels pt is drowsy & sleepy. Appetite  ok but not what is once was, daughter also states pt seems sad and depressed.

## 2020-08-24 NOTE — Telephone Encounter (Signed)
Called daughter [name], LVM advising Dr Marjory Lies will change her RX to levetiracetam 250 mg twice daily if she agrees. Requested she call back tomorrow; if she agrees; new order will have to be faxed to [facility]. Left #.

## 2020-08-25 DIAGNOSIS — I10 Essential (primary) hypertension: Secondary | ICD-10-CM | POA: Diagnosis not present

## 2020-08-25 DIAGNOSIS — I739 Peripheral vascular disease, unspecified: Secondary | ICD-10-CM | POA: Diagnosis not present

## 2020-08-25 DIAGNOSIS — E1122 Type 2 diabetes mellitus with diabetic chronic kidney disease: Secondary | ICD-10-CM | POA: Diagnosis not present

## 2020-08-25 DIAGNOSIS — E1151 Type 2 diabetes mellitus with diabetic peripheral angiopathy without gangrene: Secondary | ICD-10-CM | POA: Diagnosis not present

## 2020-08-25 DIAGNOSIS — K219 Gastro-esophageal reflux disease without esophagitis: Secondary | ICD-10-CM | POA: Diagnosis not present

## 2020-08-25 DIAGNOSIS — F0150 Vascular dementia without behavioral disturbance: Secondary | ICD-10-CM | POA: Diagnosis not present

## 2020-08-25 DIAGNOSIS — I639 Cerebral infarction, unspecified: Secondary | ICD-10-CM | POA: Diagnosis not present

## 2020-08-25 DIAGNOSIS — M069 Rheumatoid arthritis, unspecified: Secondary | ICD-10-CM | POA: Diagnosis not present

## 2020-08-25 DIAGNOSIS — I482 Chronic atrial fibrillation, unspecified: Secondary | ICD-10-CM | POA: Diagnosis not present

## 2020-08-26 DIAGNOSIS — K219 Gastro-esophageal reflux disease without esophagitis: Secondary | ICD-10-CM | POA: Diagnosis not present

## 2020-08-26 DIAGNOSIS — I739 Peripheral vascular disease, unspecified: Secondary | ICD-10-CM | POA: Diagnosis not present

## 2020-08-26 DIAGNOSIS — M069 Rheumatoid arthritis, unspecified: Secondary | ICD-10-CM | POA: Diagnosis not present

## 2020-08-26 DIAGNOSIS — I5032 Chronic diastolic (congestive) heart failure: Secondary | ICD-10-CM | POA: Diagnosis not present

## 2020-08-26 DIAGNOSIS — R569 Unspecified convulsions: Secondary | ICD-10-CM | POA: Diagnosis not present

## 2020-08-26 DIAGNOSIS — F0150 Vascular dementia without behavioral disturbance: Secondary | ICD-10-CM | POA: Diagnosis not present

## 2020-08-26 DIAGNOSIS — I10 Essential (primary) hypertension: Secondary | ICD-10-CM | POA: Diagnosis not present

## 2020-08-26 DIAGNOSIS — E1122 Type 2 diabetes mellitus with diabetic chronic kidney disease: Secondary | ICD-10-CM | POA: Diagnosis not present

## 2020-08-26 DIAGNOSIS — F419 Anxiety disorder, unspecified: Secondary | ICD-10-CM | POA: Diagnosis not present

## 2020-08-26 DIAGNOSIS — I482 Chronic atrial fibrillation, unspecified: Secondary | ICD-10-CM | POA: Diagnosis not present

## 2020-08-26 DIAGNOSIS — F329 Major depressive disorder, single episode, unspecified: Secondary | ICD-10-CM | POA: Diagnosis not present

## 2020-08-26 DIAGNOSIS — F0390 Unspecified dementia without behavioral disturbance: Secondary | ICD-10-CM | POA: Diagnosis not present

## 2020-08-26 DIAGNOSIS — I639 Cerebral infarction, unspecified: Secondary | ICD-10-CM | POA: Diagnosis not present

## 2020-08-26 DIAGNOSIS — E1151 Type 2 diabetes mellitus with diabetic peripheral angiopathy without gangrene: Secondary | ICD-10-CM | POA: Diagnosis not present

## 2020-08-26 NOTE — Telephone Encounter (Signed)
Ok to to try levetiracetam 250mg  at bedtime. -VRP

## 2020-08-26 NOTE — Telephone Encounter (Signed)
Spoke with daughter and advised Dr Marjory Lies agrees to try 250 mg at bedtime. I advised she let us know if she has any concerns after dose is reduced.  She  verbalized understanding, appreciation. Called [facility], LVM for  charge nurse requesting call back.

## 2020-08-26 NOTE — Telephone Encounter (Signed)
Called [name] who is asking if MD still recommends 500 mg total dose a day or can it be cut to 250 mg daily? She never dozed like this before, she's agitated, loss of appetite, depressed. She wants to know if MD will agree to 250 mg at night only, unless he strongly wants dose to be 500 mg total daily. I advised will discuss with Dr Lewie Loron and let her know. New Rx would be faxed to [facility] Phone: [phone], Denita Lung, NP.

## 2020-08-26 NOTE — Telephone Encounter (Signed)
Pt's daughter called back to speak to RN regarding the Keppra the pt is on.

## 2020-08-27 DIAGNOSIS — F419 Anxiety disorder, unspecified: Secondary | ICD-10-CM | POA: Diagnosis not present

## 2020-08-27 DIAGNOSIS — I10 Essential (primary) hypertension: Secondary | ICD-10-CM | POA: Diagnosis not present

## 2020-08-27 DIAGNOSIS — R569 Unspecified convulsions: Secondary | ICD-10-CM | POA: Diagnosis not present

## 2020-08-27 DIAGNOSIS — I482 Chronic atrial fibrillation, unspecified: Secondary | ICD-10-CM | POA: Diagnosis not present

## 2020-08-27 DIAGNOSIS — I5032 Chronic diastolic (congestive) heart failure: Secondary | ICD-10-CM | POA: Diagnosis not present

## 2020-08-27 DIAGNOSIS — F329 Major depressive disorder, single episode, unspecified: Secondary | ICD-10-CM | POA: Diagnosis not present

## 2020-08-27 MED ORDER — LEVETIRACETAM 250 MG PO TABS: 250.0000 mg | ORAL_TABLET | Freq: Every day | ORAL | 5 refills | Status: DC

## 2020-08-27 NOTE — Telephone Encounter (Signed)
[name] from [facility] called you back. Please call back when available. Or she can try you again later.

## 2020-08-27 NOTE — Addendum Note (Signed)
Addended by: Colen Darling C on: 08/27/2020 11:43 AM   Modules accepted: Orders

## 2020-08-27 NOTE — Telephone Encounter (Signed)
Called [facility], spoke with [name] who took my information ,will pass on to the nurse.

## 2020-08-27 NOTE — Telephone Encounter (Signed)
Called [facility], spoke with Teddy Spike, nurse who stated the Rx can be faxed to [facility] pharmacy, [facility]. She will discontinue levetiracetam 500 mg ER.  Rx signed, faxed to [facility]. Received confirmation.

## 2020-08-27 NOTE — Addendum Note (Signed)
Addended by: Colen Darling C on: 08/27/2020 01:09 PM   Modules accepted: Orders

## 2020-08-28 DIAGNOSIS — F0150 Vascular dementia without behavioral disturbance: Secondary | ICD-10-CM | POA: Diagnosis not present

## 2020-08-28 DIAGNOSIS — M069 Rheumatoid arthritis, unspecified: Secondary | ICD-10-CM | POA: Diagnosis not present

## 2020-08-28 DIAGNOSIS — I482 Chronic atrial fibrillation, unspecified: Secondary | ICD-10-CM | POA: Diagnosis not present

## 2020-08-28 DIAGNOSIS — E1151 Type 2 diabetes mellitus with diabetic peripheral angiopathy without gangrene: Secondary | ICD-10-CM | POA: Diagnosis not present

## 2020-08-28 DIAGNOSIS — E1122 Type 2 diabetes mellitus with diabetic chronic kidney disease: Secondary | ICD-10-CM | POA: Diagnosis not present

## 2020-08-28 DIAGNOSIS — I10 Essential (primary) hypertension: Secondary | ICD-10-CM | POA: Diagnosis not present

## 2020-08-28 DIAGNOSIS — K219 Gastro-esophageal reflux disease without esophagitis: Secondary | ICD-10-CM | POA: Diagnosis not present

## 2020-08-28 DIAGNOSIS — I639 Cerebral infarction, unspecified: Secondary | ICD-10-CM | POA: Diagnosis not present

## 2020-08-28 DIAGNOSIS — I739 Peripheral vascular disease, unspecified: Secondary | ICD-10-CM | POA: Diagnosis not present

## 2020-08-31 DIAGNOSIS — R110 Nausea: Secondary | ICD-10-CM | POA: Diagnosis not present

## 2020-08-31 DIAGNOSIS — M069 Rheumatoid arthritis, unspecified: Secondary | ICD-10-CM | POA: Diagnosis not present

## 2020-08-31 DIAGNOSIS — I482 Chronic atrial fibrillation, unspecified: Secondary | ICD-10-CM | POA: Diagnosis not present

## 2020-08-31 DIAGNOSIS — N3020 Other chronic cystitis without hematuria: Secondary | ICD-10-CM | POA: Diagnosis not present

## 2020-08-31 DIAGNOSIS — I5032 Chronic diastolic (congestive) heart failure: Secondary | ICD-10-CM | POA: Diagnosis not present

## 2020-09-01 DIAGNOSIS — E1121 Type 2 diabetes mellitus with diabetic nephropathy: Secondary | ICD-10-CM | POA: Diagnosis not present

## 2020-09-01 DIAGNOSIS — E1122 Type 2 diabetes mellitus with diabetic chronic kidney disease: Secondary | ICD-10-CM | POA: Diagnosis not present

## 2020-09-01 DIAGNOSIS — D631 Anemia in chronic kidney disease: Secondary | ICD-10-CM | POA: Diagnosis not present

## 2020-09-01 DIAGNOSIS — E1151 Type 2 diabetes mellitus with diabetic peripheral angiopathy without gangrene: Secondary | ICD-10-CM | POA: Diagnosis not present

## 2020-09-01 DIAGNOSIS — K219 Gastro-esophageal reflux disease without esophagitis: Secondary | ICD-10-CM | POA: Diagnosis not present

## 2020-09-01 DIAGNOSIS — I739 Peripheral vascular disease, unspecified: Secondary | ICD-10-CM | POA: Diagnosis not present

## 2020-09-01 DIAGNOSIS — N184 Chronic kidney disease, stage 4 (severe): Secondary | ICD-10-CM | POA: Diagnosis not present

## 2020-09-01 DIAGNOSIS — I482 Chronic atrial fibrillation, unspecified: Secondary | ICD-10-CM | POA: Diagnosis not present

## 2020-09-01 DIAGNOSIS — M069 Rheumatoid arthritis, unspecified: Secondary | ICD-10-CM | POA: Diagnosis not present

## 2020-09-01 DIAGNOSIS — R946 Abnormal results of thyroid function studies: Secondary | ICD-10-CM | POA: Diagnosis not present

## 2020-09-01 DIAGNOSIS — I10 Essential (primary) hypertension: Secondary | ICD-10-CM | POA: Diagnosis not present

## 2020-09-01 DIAGNOSIS — F0150 Vascular dementia without behavioral disturbance: Secondary | ICD-10-CM | POA: Diagnosis not present

## 2020-09-01 DIAGNOSIS — D649 Anemia, unspecified: Secondary | ICD-10-CM | POA: Diagnosis not present

## 2020-09-01 DIAGNOSIS — I639 Cerebral infarction, unspecified: Secondary | ICD-10-CM | POA: Diagnosis not present

## 2020-09-02 DIAGNOSIS — E1122 Type 2 diabetes mellitus with diabetic chronic kidney disease: Secondary | ICD-10-CM | POA: Diagnosis not present

## 2020-09-02 DIAGNOSIS — I10 Essential (primary) hypertension: Secondary | ICD-10-CM | POA: Diagnosis not present

## 2020-09-02 DIAGNOSIS — I639 Cerebral infarction, unspecified: Secondary | ICD-10-CM | POA: Diagnosis not present

## 2020-09-02 DIAGNOSIS — I482 Chronic atrial fibrillation, unspecified: Secondary | ICD-10-CM | POA: Diagnosis not present

## 2020-09-02 DIAGNOSIS — M069 Rheumatoid arthritis, unspecified: Secondary | ICD-10-CM | POA: Diagnosis not present

## 2020-09-02 DIAGNOSIS — I739 Peripheral vascular disease, unspecified: Secondary | ICD-10-CM | POA: Diagnosis not present

## 2020-09-02 DIAGNOSIS — E1151 Type 2 diabetes mellitus with diabetic peripheral angiopathy without gangrene: Secondary | ICD-10-CM | POA: Diagnosis not present

## 2020-09-02 DIAGNOSIS — K219 Gastro-esophageal reflux disease without esophagitis: Secondary | ICD-10-CM | POA: Diagnosis not present

## 2020-09-02 DIAGNOSIS — F0150 Vascular dementia without behavioral disturbance: Secondary | ICD-10-CM | POA: Diagnosis not present

## 2020-09-05 DIAGNOSIS — F329 Major depressive disorder, single episode, unspecified: Secondary | ICD-10-CM | POA: Diagnosis not present

## 2020-09-05 DIAGNOSIS — F0390 Unspecified dementia without behavioral disturbance: Secondary | ICD-10-CM | POA: Diagnosis not present

## 2020-09-05 DIAGNOSIS — I5032 Chronic diastolic (congestive) heart failure: Secondary | ICD-10-CM | POA: Diagnosis not present

## 2020-09-05 DIAGNOSIS — F419 Anxiety disorder, unspecified: Secondary | ICD-10-CM | POA: Diagnosis not present

## 2020-09-05 DIAGNOSIS — K219 Gastro-esophageal reflux disease without esophagitis: Secondary | ICD-10-CM | POA: Diagnosis not present

## 2020-09-05 DIAGNOSIS — E1122 Type 2 diabetes mellitus with diabetic chronic kidney disease: Secondary | ICD-10-CM | POA: Diagnosis not present

## 2020-09-05 DIAGNOSIS — M069 Rheumatoid arthritis, unspecified: Secondary | ICD-10-CM | POA: Diagnosis not present

## 2020-09-05 DIAGNOSIS — I482 Chronic atrial fibrillation, unspecified: Secondary | ICD-10-CM | POA: Diagnosis not present

## 2020-09-05 DIAGNOSIS — I10 Essential (primary) hypertension: Secondary | ICD-10-CM | POA: Diagnosis not present

## 2020-09-05 DIAGNOSIS — I639 Cerebral infarction, unspecified: Secondary | ICD-10-CM | POA: Diagnosis not present

## 2020-09-05 DIAGNOSIS — F0150 Vascular dementia without behavioral disturbance: Secondary | ICD-10-CM | POA: Diagnosis not present

## 2020-09-05 DIAGNOSIS — I739 Peripheral vascular disease, unspecified: Secondary | ICD-10-CM | POA: Diagnosis not present

## 2020-09-05 DIAGNOSIS — E1151 Type 2 diabetes mellitus with diabetic peripheral angiopathy without gangrene: Secondary | ICD-10-CM | POA: Diagnosis not present

## 2020-09-10 DIAGNOSIS — I739 Peripheral vascular disease, unspecified: Secondary | ICD-10-CM | POA: Diagnosis not present

## 2020-09-10 DIAGNOSIS — F419 Anxiety disorder, unspecified: Secondary | ICD-10-CM | POA: Diagnosis not present

## 2020-09-10 DIAGNOSIS — I10 Essential (primary) hypertension: Secondary | ICD-10-CM | POA: Diagnosis not present

## 2020-09-10 DIAGNOSIS — I639 Cerebral infarction, unspecified: Secondary | ICD-10-CM | POA: Diagnosis not present

## 2020-09-10 DIAGNOSIS — I2510 Atherosclerotic heart disease of native coronary artery without angina pectoris: Secondary | ICD-10-CM | POA: Diagnosis not present

## 2020-09-10 DIAGNOSIS — K551 Chronic vascular disorders of intestine: Secondary | ICD-10-CM | POA: Diagnosis not present

## 2020-09-12 DIAGNOSIS — I739 Peripheral vascular disease, unspecified: Secondary | ICD-10-CM | POA: Diagnosis not present

## 2020-09-12 DIAGNOSIS — I639 Cerebral infarction, unspecified: Secondary | ICD-10-CM | POA: Diagnosis not present

## 2020-09-12 DIAGNOSIS — F419 Anxiety disorder, unspecified: Secondary | ICD-10-CM | POA: Diagnosis not present

## 2020-09-12 DIAGNOSIS — K551 Chronic vascular disorders of intestine: Secondary | ICD-10-CM | POA: Diagnosis not present

## 2020-09-12 DIAGNOSIS — I10 Essential (primary) hypertension: Secondary | ICD-10-CM | POA: Diagnosis not present

## 2020-09-12 DIAGNOSIS — I2510 Atherosclerotic heart disease of native coronary artery without angina pectoris: Secondary | ICD-10-CM | POA: Diagnosis not present

## 2020-09-15 DIAGNOSIS — D0461 Carcinoma in situ of skin of right upper limb, including shoulder: Secondary | ICD-10-CM | POA: Diagnosis not present

## 2020-09-15 DIAGNOSIS — F419 Anxiety disorder, unspecified: Secondary | ICD-10-CM | POA: Diagnosis not present

## 2020-09-15 DIAGNOSIS — I5032 Chronic diastolic (congestive) heart failure: Secondary | ICD-10-CM | POA: Diagnosis not present

## 2020-09-15 DIAGNOSIS — F0390 Unspecified dementia without behavioral disturbance: Secondary | ICD-10-CM | POA: Diagnosis not present

## 2020-09-15 DIAGNOSIS — I10 Essential (primary) hypertension: Secondary | ICD-10-CM | POA: Diagnosis not present

## 2020-09-15 DIAGNOSIS — N3020 Other chronic cystitis without hematuria: Secondary | ICD-10-CM | POA: Diagnosis not present

## 2020-09-15 DIAGNOSIS — I482 Chronic atrial fibrillation, unspecified: Secondary | ICD-10-CM | POA: Diagnosis not present

## 2020-09-15 DIAGNOSIS — R569 Unspecified convulsions: Secondary | ICD-10-CM | POA: Diagnosis not present

## 2020-09-16 DIAGNOSIS — F419 Anxiety disorder, unspecified: Secondary | ICD-10-CM | POA: Diagnosis not present

## 2020-09-16 DIAGNOSIS — I739 Peripheral vascular disease, unspecified: Secondary | ICD-10-CM | POA: Diagnosis not present

## 2020-09-16 DIAGNOSIS — I2510 Atherosclerotic heart disease of native coronary artery without angina pectoris: Secondary | ICD-10-CM | POA: Diagnosis not present

## 2020-09-16 DIAGNOSIS — K551 Chronic vascular disorders of intestine: Secondary | ICD-10-CM | POA: Diagnosis not present

## 2020-09-16 DIAGNOSIS — I10 Essential (primary) hypertension: Secondary | ICD-10-CM | POA: Diagnosis not present

## 2020-09-16 DIAGNOSIS — I639 Cerebral infarction, unspecified: Secondary | ICD-10-CM | POA: Diagnosis not present

## 2020-09-17 DIAGNOSIS — I739 Peripheral vascular disease, unspecified: Secondary | ICD-10-CM | POA: Diagnosis not present

## 2020-09-17 DIAGNOSIS — I10 Essential (primary) hypertension: Secondary | ICD-10-CM | POA: Diagnosis not present

## 2020-09-17 DIAGNOSIS — I639 Cerebral infarction, unspecified: Secondary | ICD-10-CM | POA: Diagnosis not present

## 2020-09-17 DIAGNOSIS — K551 Chronic vascular disorders of intestine: Secondary | ICD-10-CM | POA: Diagnosis not present

## 2020-09-17 DIAGNOSIS — F419 Anxiety disorder, unspecified: Secondary | ICD-10-CM | POA: Diagnosis not present

## 2020-09-17 DIAGNOSIS — I2510 Atherosclerotic heart disease of native coronary artery without angina pectoris: Secondary | ICD-10-CM | POA: Diagnosis not present

## 2020-09-20 DIAGNOSIS — F419 Anxiety disorder, unspecified: Secondary | ICD-10-CM | POA: Diagnosis not present

## 2020-09-20 DIAGNOSIS — I10 Essential (primary) hypertension: Secondary | ICD-10-CM | POA: Diagnosis not present

## 2020-09-20 DIAGNOSIS — I639 Cerebral infarction, unspecified: Secondary | ICD-10-CM | POA: Diagnosis not present

## 2020-09-20 DIAGNOSIS — I2510 Atherosclerotic heart disease of native coronary artery without angina pectoris: Secondary | ICD-10-CM | POA: Diagnosis not present

## 2020-09-20 DIAGNOSIS — K551 Chronic vascular disorders of intestine: Secondary | ICD-10-CM | POA: Diagnosis not present

## 2020-09-20 DIAGNOSIS — I739 Peripheral vascular disease, unspecified: Secondary | ICD-10-CM | POA: Diagnosis not present

## 2020-09-21 DIAGNOSIS — N3020 Other chronic cystitis without hematuria: Secondary | ICD-10-CM | POA: Diagnosis not present

## 2020-09-21 DIAGNOSIS — R059 Cough, unspecified: Secondary | ICD-10-CM | POA: Diagnosis not present

## 2020-09-21 DIAGNOSIS — I10 Essential (primary) hypertension: Secondary | ICD-10-CM | POA: Diagnosis not present

## 2020-09-21 DIAGNOSIS — E08621 Diabetes mellitus due to underlying condition with foot ulcer: Secondary | ICD-10-CM | POA: Diagnosis not present

## 2020-09-21 DIAGNOSIS — I482 Chronic atrial fibrillation, unspecified: Secondary | ICD-10-CM | POA: Diagnosis not present

## 2020-09-21 DIAGNOSIS — I5032 Chronic diastolic (congestive) heart failure: Secondary | ICD-10-CM | POA: Diagnosis not present

## 2020-09-21 DIAGNOSIS — L97412 Non-pressure chronic ulcer of right heel and midfoot with fat layer exposed: Secondary | ICD-10-CM | POA: Diagnosis not present

## 2020-09-22 DIAGNOSIS — G4700 Insomnia, unspecified: Secondary | ICD-10-CM | POA: Diagnosis not present

## 2020-09-22 DIAGNOSIS — F0281 Dementia in other diseases classified elsewhere with behavioral disturbance: Secondary | ICD-10-CM | POA: Diagnosis not present

## 2020-09-22 DIAGNOSIS — I2510 Atherosclerotic heart disease of native coronary artery without angina pectoris: Secondary | ICD-10-CM | POA: Diagnosis not present

## 2020-09-22 DIAGNOSIS — F0151 Vascular dementia with behavioral disturbance: Secondary | ICD-10-CM | POA: Diagnosis not present

## 2020-09-22 DIAGNOSIS — F419 Anxiety disorder, unspecified: Secondary | ICD-10-CM | POA: Diagnosis not present

## 2020-09-22 DIAGNOSIS — G301 Alzheimer's disease with late onset: Secondary | ICD-10-CM | POA: Diagnosis not present

## 2020-09-22 DIAGNOSIS — K551 Chronic vascular disorders of intestine: Secondary | ICD-10-CM | POA: Diagnosis not present

## 2020-09-22 DIAGNOSIS — I739 Peripheral vascular disease, unspecified: Secondary | ICD-10-CM | POA: Diagnosis not present

## 2020-09-22 DIAGNOSIS — I10 Essential (primary) hypertension: Secondary | ICD-10-CM | POA: Diagnosis not present

## 2020-09-22 DIAGNOSIS — F331 Major depressive disorder, recurrent, moderate: Secondary | ICD-10-CM | POA: Diagnosis not present

## 2020-09-22 DIAGNOSIS — I639 Cerebral infarction, unspecified: Secondary | ICD-10-CM | POA: Diagnosis not present

## 2020-09-23 DIAGNOSIS — R799 Abnormal finding of blood chemistry, unspecified: Secondary | ICD-10-CM | POA: Diagnosis not present

## 2020-09-24 DIAGNOSIS — I739 Peripheral vascular disease, unspecified: Secondary | ICD-10-CM | POA: Diagnosis not present

## 2020-09-24 DIAGNOSIS — I639 Cerebral infarction, unspecified: Secondary | ICD-10-CM | POA: Diagnosis not present

## 2020-09-24 DIAGNOSIS — I2510 Atherosclerotic heart disease of native coronary artery without angina pectoris: Secondary | ICD-10-CM | POA: Diagnosis not present

## 2020-09-24 DIAGNOSIS — F419 Anxiety disorder, unspecified: Secondary | ICD-10-CM | POA: Diagnosis not present

## 2020-09-24 DIAGNOSIS — I10 Essential (primary) hypertension: Secondary | ICD-10-CM | POA: Diagnosis not present

## 2020-09-24 DIAGNOSIS — K551 Chronic vascular disorders of intestine: Secondary | ICD-10-CM | POA: Diagnosis not present

## 2020-09-29 DIAGNOSIS — D649 Anemia, unspecified: Secondary | ICD-10-CM | POA: Diagnosis not present

## 2020-09-29 DIAGNOSIS — R0902 Hypoxemia: Secondary | ICD-10-CM | POA: Diagnosis not present

## 2020-09-30 DIAGNOSIS — M069 Rheumatoid arthritis, unspecified: Secondary | ICD-10-CM | POA: Diagnosis not present

## 2020-09-30 DIAGNOSIS — K551 Chronic vascular disorders of intestine: Secondary | ICD-10-CM | POA: Diagnosis not present

## 2020-09-30 DIAGNOSIS — I2510 Atherosclerotic heart disease of native coronary artery without angina pectoris: Secondary | ICD-10-CM | POA: Diagnosis not present

## 2020-09-30 DIAGNOSIS — F419 Anxiety disorder, unspecified: Secondary | ICD-10-CM | POA: Diagnosis not present

## 2020-09-30 DIAGNOSIS — I739 Peripheral vascular disease, unspecified: Secondary | ICD-10-CM | POA: Diagnosis not present

## 2020-09-30 DIAGNOSIS — I639 Cerebral infarction, unspecified: Secondary | ICD-10-CM | POA: Diagnosis not present

## 2020-09-30 DIAGNOSIS — I4891 Unspecified atrial fibrillation: Secondary | ICD-10-CM | POA: Diagnosis not present

## 2020-09-30 DIAGNOSIS — N183 Chronic kidney disease, stage 3 unspecified: Secondary | ICD-10-CM | POA: Diagnosis not present

## 2020-09-30 DIAGNOSIS — I10 Essential (primary) hypertension: Secondary | ICD-10-CM | POA: Diagnosis not present

## 2020-10-01 DIAGNOSIS — N3020 Other chronic cystitis without hematuria: Secondary | ICD-10-CM | POA: Diagnosis not present

## 2020-10-01 DIAGNOSIS — I2510 Atherosclerotic heart disease of native coronary artery without angina pectoris: Secondary | ICD-10-CM | POA: Diagnosis not present

## 2020-10-01 DIAGNOSIS — F0390 Unspecified dementia without behavioral disturbance: Secondary | ICD-10-CM | POA: Diagnosis not present

## 2020-10-01 DIAGNOSIS — J189 Pneumonia, unspecified organism: Secondary | ICD-10-CM | POA: Diagnosis not present

## 2020-10-01 DIAGNOSIS — I5032 Chronic diastolic (congestive) heart failure: Secondary | ICD-10-CM | POA: Diagnosis not present

## 2020-10-01 DIAGNOSIS — F419 Anxiety disorder, unspecified: Secondary | ICD-10-CM | POA: Diagnosis not present

## 2020-10-01 DIAGNOSIS — I10 Essential (primary) hypertension: Secondary | ICD-10-CM | POA: Diagnosis not present

## 2020-10-01 DIAGNOSIS — I739 Peripheral vascular disease, unspecified: Secondary | ICD-10-CM | POA: Diagnosis not present

## 2020-10-01 DIAGNOSIS — K551 Chronic vascular disorders of intestine: Secondary | ICD-10-CM | POA: Diagnosis not present

## 2020-10-01 DIAGNOSIS — I482 Chronic atrial fibrillation, unspecified: Secondary | ICD-10-CM | POA: Diagnosis not present

## 2020-10-01 DIAGNOSIS — I639 Cerebral infarction, unspecified: Secondary | ICD-10-CM | POA: Diagnosis not present

## 2020-10-02 DIAGNOSIS — I639 Cerebral infarction, unspecified: Secondary | ICD-10-CM | POA: Diagnosis not present

## 2020-10-02 DIAGNOSIS — K551 Chronic vascular disorders of intestine: Secondary | ICD-10-CM | POA: Diagnosis not present

## 2020-10-02 DIAGNOSIS — F419 Anxiety disorder, unspecified: Secondary | ICD-10-CM | POA: Diagnosis not present

## 2020-10-02 DIAGNOSIS — I739 Peripheral vascular disease, unspecified: Secondary | ICD-10-CM | POA: Diagnosis not present

## 2020-10-02 DIAGNOSIS — I10 Essential (primary) hypertension: Secondary | ICD-10-CM | POA: Diagnosis not present

## 2020-10-02 DIAGNOSIS — I2510 Atherosclerotic heart disease of native coronary artery without angina pectoris: Secondary | ICD-10-CM | POA: Diagnosis not present

## 2020-10-03 DIAGNOSIS — I10 Essential (primary) hypertension: Secondary | ICD-10-CM | POA: Diagnosis not present

## 2020-10-03 DIAGNOSIS — F419 Anxiety disorder, unspecified: Secondary | ICD-10-CM | POA: Diagnosis not present

## 2020-10-03 DIAGNOSIS — I5032 Chronic diastolic (congestive) heart failure: Secondary | ICD-10-CM | POA: Diagnosis not present

## 2020-10-03 DIAGNOSIS — I482 Chronic atrial fibrillation, unspecified: Secondary | ICD-10-CM | POA: Diagnosis not present

## 2020-10-03 DIAGNOSIS — F0390 Unspecified dementia without behavioral disturbance: Secondary | ICD-10-CM | POA: Diagnosis not present

## 2020-10-03 DIAGNOSIS — F329 Major depressive disorder, single episode, unspecified: Secondary | ICD-10-CM | POA: Diagnosis not present

## 2020-10-04 DIAGNOSIS — I739 Peripheral vascular disease, unspecified: Secondary | ICD-10-CM | POA: Diagnosis not present

## 2020-10-04 DIAGNOSIS — K551 Chronic vascular disorders of intestine: Secondary | ICD-10-CM | POA: Diagnosis not present

## 2020-10-04 DIAGNOSIS — I2510 Atherosclerotic heart disease of native coronary artery without angina pectoris: Secondary | ICD-10-CM | POA: Diagnosis not present

## 2020-10-04 DIAGNOSIS — I639 Cerebral infarction, unspecified: Secondary | ICD-10-CM | POA: Diagnosis not present

## 2020-10-04 DIAGNOSIS — F419 Anxiety disorder, unspecified: Secondary | ICD-10-CM | POA: Diagnosis not present

## 2020-10-04 DIAGNOSIS — I10 Essential (primary) hypertension: Secondary | ICD-10-CM | POA: Diagnosis not present

## 2020-10-06 DIAGNOSIS — I739 Peripheral vascular disease, unspecified: Secondary | ICD-10-CM | POA: Diagnosis not present

## 2020-10-06 DIAGNOSIS — I2510 Atherosclerotic heart disease of native coronary artery without angina pectoris: Secondary | ICD-10-CM | POA: Diagnosis not present

## 2020-10-06 DIAGNOSIS — K551 Chronic vascular disorders of intestine: Secondary | ICD-10-CM | POA: Diagnosis not present

## 2020-10-06 DIAGNOSIS — F419 Anxiety disorder, unspecified: Secondary | ICD-10-CM | POA: Diagnosis not present

## 2020-10-06 DIAGNOSIS — I639 Cerebral infarction, unspecified: Secondary | ICD-10-CM | POA: Diagnosis not present

## 2020-10-06 DIAGNOSIS — I10 Essential (primary) hypertension: Secondary | ICD-10-CM | POA: Diagnosis not present

## 2020-10-07 DIAGNOSIS — F419 Anxiety disorder, unspecified: Secondary | ICD-10-CM | POA: Diagnosis not present

## 2020-10-07 DIAGNOSIS — I639 Cerebral infarction, unspecified: Secondary | ICD-10-CM | POA: Diagnosis not present

## 2020-10-07 DIAGNOSIS — I2510 Atherosclerotic heart disease of native coronary artery without angina pectoris: Secondary | ICD-10-CM | POA: Diagnosis not present

## 2020-10-07 DIAGNOSIS — K551 Chronic vascular disorders of intestine: Secondary | ICD-10-CM | POA: Diagnosis not present

## 2020-10-07 DIAGNOSIS — I10 Essential (primary) hypertension: Secondary | ICD-10-CM | POA: Diagnosis not present

## 2020-10-07 DIAGNOSIS — I739 Peripheral vascular disease, unspecified: Secondary | ICD-10-CM | POA: Diagnosis not present

## 2020-10-08 DIAGNOSIS — I639 Cerebral infarction, unspecified: Secondary | ICD-10-CM | POA: Diagnosis not present

## 2020-10-08 DIAGNOSIS — I10 Essential (primary) hypertension: Secondary | ICD-10-CM | POA: Diagnosis not present

## 2020-10-08 DIAGNOSIS — I2510 Atherosclerotic heart disease of native coronary artery without angina pectoris: Secondary | ICD-10-CM | POA: Diagnosis not present

## 2020-10-08 DIAGNOSIS — I739 Peripheral vascular disease, unspecified: Secondary | ICD-10-CM | POA: Diagnosis not present

## 2020-10-08 DIAGNOSIS — K551 Chronic vascular disorders of intestine: Secondary | ICD-10-CM | POA: Diagnosis not present

## 2020-10-08 DIAGNOSIS — F419 Anxiety disorder, unspecified: Secondary | ICD-10-CM | POA: Diagnosis not present

## 2020-10-09 DIAGNOSIS — I5032 Chronic diastolic (congestive) heart failure: Secondary | ICD-10-CM | POA: Diagnosis not present

## 2020-10-09 DIAGNOSIS — F419 Anxiety disorder, unspecified: Secondary | ICD-10-CM | POA: Diagnosis not present

## 2020-10-09 DIAGNOSIS — F331 Major depressive disorder, recurrent, moderate: Secondary | ICD-10-CM | POA: Diagnosis not present

## 2020-10-09 DIAGNOSIS — N1832 Chronic kidney disease, stage 3b: Secondary | ICD-10-CM | POA: Diagnosis not present

## 2020-10-09 DIAGNOSIS — I10 Essential (primary) hypertension: Secondary | ICD-10-CM | POA: Diagnosis not present

## 2020-10-09 DIAGNOSIS — N3020 Other chronic cystitis without hematuria: Secondary | ICD-10-CM | POA: Diagnosis not present

## 2020-10-09 DIAGNOSIS — J189 Pneumonia, unspecified organism: Secondary | ICD-10-CM | POA: Diagnosis not present

## 2020-10-09 DIAGNOSIS — E039 Hypothyroidism, unspecified: Secondary | ICD-10-CM | POA: Diagnosis not present

## 2020-10-09 DIAGNOSIS — E1151 Type 2 diabetes mellitus with diabetic peripheral angiopathy without gangrene: Secondary | ICD-10-CM | POA: Diagnosis not present

## 2020-10-09 DIAGNOSIS — E1122 Type 2 diabetes mellitus with diabetic chronic kidney disease: Secondary | ICD-10-CM | POA: Diagnosis not present

## 2020-10-09 DIAGNOSIS — I482 Chronic atrial fibrillation, unspecified: Secondary | ICD-10-CM | POA: Diagnosis not present

## 2020-10-09 DIAGNOSIS — K219 Gastro-esophageal reflux disease without esophagitis: Secondary | ICD-10-CM | POA: Diagnosis not present

## 2020-10-12 DIAGNOSIS — E1122 Type 2 diabetes mellitus with diabetic chronic kidney disease: Secondary | ICD-10-CM | POA: Diagnosis not present

## 2020-10-12 DIAGNOSIS — F419 Anxiety disorder, unspecified: Secondary | ICD-10-CM | POA: Diagnosis not present

## 2020-10-12 DIAGNOSIS — I482 Chronic atrial fibrillation, unspecified: Secondary | ICD-10-CM | POA: Diagnosis not present

## 2020-10-12 DIAGNOSIS — I10 Essential (primary) hypertension: Secondary | ICD-10-CM | POA: Diagnosis not present

## 2020-10-12 DIAGNOSIS — N1832 Chronic kidney disease, stage 3b: Secondary | ICD-10-CM | POA: Diagnosis not present

## 2020-10-12 DIAGNOSIS — K219 Gastro-esophageal reflux disease without esophagitis: Secondary | ICD-10-CM | POA: Diagnosis not present

## 2020-10-12 DIAGNOSIS — E039 Hypothyroidism, unspecified: Secondary | ICD-10-CM | POA: Diagnosis not present

## 2020-10-12 DIAGNOSIS — F331 Major depressive disorder, recurrent, moderate: Secondary | ICD-10-CM | POA: Diagnosis not present

## 2020-10-12 DIAGNOSIS — E1151 Type 2 diabetes mellitus with diabetic peripheral angiopathy without gangrene: Secondary | ICD-10-CM | POA: Diagnosis not present

## 2020-10-13 DIAGNOSIS — K219 Gastro-esophageal reflux disease without esophagitis: Secondary | ICD-10-CM | POA: Diagnosis not present

## 2020-10-13 DIAGNOSIS — F331 Major depressive disorder, recurrent, moderate: Secondary | ICD-10-CM | POA: Diagnosis not present

## 2020-10-13 DIAGNOSIS — I482 Chronic atrial fibrillation, unspecified: Secondary | ICD-10-CM | POA: Diagnosis not present

## 2020-10-13 DIAGNOSIS — E039 Hypothyroidism, unspecified: Secondary | ICD-10-CM | POA: Diagnosis not present

## 2020-10-13 DIAGNOSIS — I10 Essential (primary) hypertension: Secondary | ICD-10-CM | POA: Diagnosis not present

## 2020-10-13 DIAGNOSIS — N1832 Chronic kidney disease, stage 3b: Secondary | ICD-10-CM | POA: Diagnosis not present

## 2020-10-13 DIAGNOSIS — E1122 Type 2 diabetes mellitus with diabetic chronic kidney disease: Secondary | ICD-10-CM | POA: Diagnosis not present

## 2020-10-13 DIAGNOSIS — F419 Anxiety disorder, unspecified: Secondary | ICD-10-CM | POA: Diagnosis not present

## 2020-10-13 DIAGNOSIS — E1151 Type 2 diabetes mellitus with diabetic peripheral angiopathy without gangrene: Secondary | ICD-10-CM | POA: Diagnosis not present

## 2020-10-14 DIAGNOSIS — F329 Major depressive disorder, single episode, unspecified: Secondary | ICD-10-CM | POA: Diagnosis not present

## 2020-10-14 DIAGNOSIS — I5032 Chronic diastolic (congestive) heart failure: Secondary | ICD-10-CM | POA: Diagnosis not present

## 2020-10-14 DIAGNOSIS — R569 Unspecified convulsions: Secondary | ICD-10-CM | POA: Diagnosis not present

## 2020-10-14 DIAGNOSIS — I482 Chronic atrial fibrillation, unspecified: Secondary | ICD-10-CM | POA: Diagnosis not present

## 2020-10-14 DIAGNOSIS — N3020 Other chronic cystitis without hematuria: Secondary | ICD-10-CM | POA: Diagnosis not present

## 2020-10-14 DIAGNOSIS — F0390 Unspecified dementia without behavioral disturbance: Secondary | ICD-10-CM | POA: Diagnosis not present

## 2020-10-16 DIAGNOSIS — I482 Chronic atrial fibrillation, unspecified: Secondary | ICD-10-CM | POA: Diagnosis not present

## 2020-10-16 DIAGNOSIS — M069 Rheumatoid arthritis, unspecified: Secondary | ICD-10-CM | POA: Diagnosis not present

## 2020-10-16 DIAGNOSIS — F0390 Unspecified dementia without behavioral disturbance: Secondary | ICD-10-CM | POA: Diagnosis not present

## 2020-10-16 DIAGNOSIS — I5032 Chronic diastolic (congestive) heart failure: Secondary | ICD-10-CM | POA: Diagnosis not present

## 2020-10-16 DIAGNOSIS — I10 Essential (primary) hypertension: Secondary | ICD-10-CM | POA: Diagnosis not present

## 2020-10-20 DIAGNOSIS — G4700 Insomnia, unspecified: Secondary | ICD-10-CM | POA: Diagnosis not present

## 2020-10-20 DIAGNOSIS — F331 Major depressive disorder, recurrent, moderate: Secondary | ICD-10-CM | POA: Diagnosis not present

## 2020-10-20 DIAGNOSIS — I482 Chronic atrial fibrillation, unspecified: Secondary | ICD-10-CM | POA: Diagnosis not present

## 2020-10-20 DIAGNOSIS — N3020 Other chronic cystitis without hematuria: Secondary | ICD-10-CM | POA: Diagnosis not present

## 2020-10-20 DIAGNOSIS — F0390 Unspecified dementia without behavioral disturbance: Secondary | ICD-10-CM | POA: Diagnosis not present

## 2020-10-20 DIAGNOSIS — I10 Essential (primary) hypertension: Secondary | ICD-10-CM | POA: Diagnosis not present

## 2020-10-20 DIAGNOSIS — I5032 Chronic diastolic (congestive) heart failure: Secondary | ICD-10-CM | POA: Diagnosis not present

## 2020-10-20 DIAGNOSIS — F0281 Dementia in other diseases classified elsewhere with behavioral disturbance: Secondary | ICD-10-CM | POA: Diagnosis not present

## 2020-10-20 DIAGNOSIS — G301 Alzheimer's disease with late onset: Secondary | ICD-10-CM | POA: Diagnosis not present

## 2020-10-20 DIAGNOSIS — F0151 Vascular dementia with behavioral disturbance: Secondary | ICD-10-CM | POA: Diagnosis not present

## 2020-10-23 DIAGNOSIS — D649 Anemia, unspecified: Secondary | ICD-10-CM | POA: Diagnosis not present

## 2020-10-23 DIAGNOSIS — I10 Essential (primary) hypertension: Secondary | ICD-10-CM | POA: Diagnosis not present

## 2020-10-24 DIAGNOSIS — F329 Major depressive disorder, single episode, unspecified: Secondary | ICD-10-CM | POA: Diagnosis not present

## 2020-10-24 DIAGNOSIS — N3020 Other chronic cystitis without hematuria: Secondary | ICD-10-CM | POA: Diagnosis not present

## 2020-10-24 DIAGNOSIS — I482 Chronic atrial fibrillation, unspecified: Secondary | ICD-10-CM | POA: Diagnosis not present

## 2020-10-24 DIAGNOSIS — I5032 Chronic diastolic (congestive) heart failure: Secondary | ICD-10-CM | POA: Diagnosis not present

## 2020-10-24 DIAGNOSIS — I10 Essential (primary) hypertension: Secondary | ICD-10-CM | POA: Diagnosis not present

## 2020-10-24 DIAGNOSIS — F0390 Unspecified dementia without behavioral disturbance: Secondary | ICD-10-CM | POA: Diagnosis not present

## 2020-10-25 DIAGNOSIS — F419 Anxiety disorder, unspecified: Secondary | ICD-10-CM | POA: Diagnosis not present

## 2020-10-25 DIAGNOSIS — I5032 Chronic diastolic (congestive) heart failure: Secondary | ICD-10-CM | POA: Diagnosis not present

## 2020-10-25 DIAGNOSIS — F329 Major depressive disorder, single episode, unspecified: Secondary | ICD-10-CM | POA: Diagnosis not present

## 2020-10-25 DIAGNOSIS — F0390 Unspecified dementia without behavioral disturbance: Secondary | ICD-10-CM | POA: Diagnosis not present

## 2020-10-25 DIAGNOSIS — I482 Chronic atrial fibrillation, unspecified: Secondary | ICD-10-CM | POA: Diagnosis not present

## 2020-10-27 DIAGNOSIS — L97412 Non-pressure chronic ulcer of right heel and midfoot with fat layer exposed: Secondary | ICD-10-CM | POA: Diagnosis not present

## 2020-10-27 DIAGNOSIS — E08621 Diabetes mellitus due to underlying condition with foot ulcer: Secondary | ICD-10-CM | POA: Diagnosis not present

## 2020-10-27 DIAGNOSIS — B353 Tinea pedis: Secondary | ICD-10-CM | POA: Diagnosis not present

## 2020-10-30 DIAGNOSIS — N3020 Other chronic cystitis without hematuria: Secondary | ICD-10-CM | POA: Diagnosis not present

## 2020-10-30 DIAGNOSIS — I482 Chronic atrial fibrillation, unspecified: Secondary | ICD-10-CM | POA: Diagnosis not present

## 2020-10-30 DIAGNOSIS — I10 Essential (primary) hypertension: Secondary | ICD-10-CM | POA: Diagnosis not present

## 2020-10-30 DIAGNOSIS — F0390 Unspecified dementia without behavioral disturbance: Secondary | ICD-10-CM | POA: Diagnosis not present

## 2020-10-30 DIAGNOSIS — I5032 Chronic diastolic (congestive) heart failure: Secondary | ICD-10-CM | POA: Diagnosis not present

## 2020-11-05 DIAGNOSIS — I5032 Chronic diastolic (congestive) heart failure: Secondary | ICD-10-CM | POA: Diagnosis not present

## 2020-11-05 DIAGNOSIS — F0390 Unspecified dementia without behavioral disturbance: Secondary | ICD-10-CM | POA: Diagnosis not present

## 2020-11-05 DIAGNOSIS — F329 Major depressive disorder, single episode, unspecified: Secondary | ICD-10-CM | POA: Diagnosis not present

## 2020-11-05 DIAGNOSIS — R059 Cough, unspecified: Secondary | ICD-10-CM | POA: Diagnosis not present

## 2020-11-05 DIAGNOSIS — I482 Chronic atrial fibrillation, unspecified: Secondary | ICD-10-CM | POA: Diagnosis not present

## 2020-11-05 DIAGNOSIS — I10 Essential (primary) hypertension: Secondary | ICD-10-CM | POA: Diagnosis not present

## 2020-11-05 DIAGNOSIS — F419 Anxiety disorder, unspecified: Secondary | ICD-10-CM | POA: Diagnosis not present

## 2020-11-05 DIAGNOSIS — N3020 Other chronic cystitis without hematuria: Secondary | ICD-10-CM | POA: Diagnosis not present

## 2020-11-06 DIAGNOSIS — N390 Urinary tract infection, site not specified: Secondary | ICD-10-CM | POA: Diagnosis not present

## 2020-11-06 DIAGNOSIS — R059 Cough, unspecified: Secondary | ICD-10-CM | POA: Diagnosis not present

## 2020-11-08 DIAGNOSIS — I482 Chronic atrial fibrillation, unspecified: Secondary | ICD-10-CM | POA: Diagnosis not present

## 2020-11-08 DIAGNOSIS — F329 Major depressive disorder, single episode, unspecified: Secondary | ICD-10-CM | POA: Diagnosis not present

## 2020-11-08 DIAGNOSIS — I10 Essential (primary) hypertension: Secondary | ICD-10-CM | POA: Diagnosis not present

## 2020-11-08 DIAGNOSIS — F0390 Unspecified dementia without behavioral disturbance: Secondary | ICD-10-CM | POA: Diagnosis not present

## 2020-11-08 DIAGNOSIS — F419 Anxiety disorder, unspecified: Secondary | ICD-10-CM | POA: Diagnosis not present

## 2020-11-09 ENCOUNTER — Non-Acute Institutional Stay: Payer: Medicaid Other

## 2020-11-09 ENCOUNTER — Other Ambulatory Visit: Payer: Self-pay

## 2020-11-09 DIAGNOSIS — I482 Chronic atrial fibrillation, unspecified: Secondary | ICD-10-CM | POA: Diagnosis not present

## 2020-11-09 DIAGNOSIS — F0390 Unspecified dementia without behavioral disturbance: Secondary | ICD-10-CM | POA: Diagnosis not present

## 2020-11-09 DIAGNOSIS — Z515 Encounter for palliative care: Secondary | ICD-10-CM

## 2020-11-09 DIAGNOSIS — I10 Essential (primary) hypertension: Secondary | ICD-10-CM | POA: Diagnosis not present

## 2020-11-09 DIAGNOSIS — N390 Urinary tract infection, site not specified: Secondary | ICD-10-CM | POA: Diagnosis not present

## 2020-11-09 DIAGNOSIS — I5032 Chronic diastolic (congestive) heart failure: Secondary | ICD-10-CM | POA: Diagnosis not present

## 2020-11-09 DIAGNOSIS — N3020 Other chronic cystitis without hematuria: Secondary | ICD-10-CM | POA: Diagnosis not present

## 2020-11-09 NOTE — Progress Notes (Signed)
COMMUNITY PALLIATIVE CARE SW NOTE  PATIENT NAME: [name] DOB: [birth year] MRN: [id number]  PRIMARY CARE PROVIDER: [name], MD  RESPONSIBLE PARTY:  Acct ID - Guarantor Home Phone Work Phone Relationship Acct Type  [id number] [name]* [phone]  Self P/F     [address] [place], [place] [address]     PLAN OF CARE and INTERVENTIONS:             GOALS OF CARE/ ADVANCE CARE PLANNING:  Goal is for patient's comfort to be maintained. Advanced Directives to be further assessed.  SOCIAL/EMOTIONAL/SPIRITUAL ASSESSMENT/ INTERVENTIONS: SW completed initial palliative care visit with patient today at [facility]. Patient was sitting up in her wheelchair sleeping. Patient was leaning to the right. SW observed that bother her hands were swollen and contracted. She did not arouse to verbal or tactile prompts. The facility CNA-[name] came in and tried to arouse patient, but she aroused for a moment, but went back to sleep. She did not appear to be in any pain or discomfort. The facility CNA advised that patient's appetite is good, however she does nap on and off throughout the day. Patient is dependent for all ADL's. Her daughter is her PCG. SW attempted to call patient's daughter-[name] [phone]) and left a message for her requesting a call back.  PATIENT/CAREGIVER EDUCATION/ COPING:  No coping issues expressed by staff-SW to assess further with patient's PCG. PERSONAL EMERGENCY PLAN:  Per facility protocol.  COMMUNITY RESOURCES COORDINATION/ HEALTH CARE NAVIGATION:  Patient is currently a resident at [name]. FINANCIAL/LEGAL CONCERNS/INTERVENTIONS:  None.     SOCIAL HX:  Social History   Tobacco Use   Smoking status: Never   Smokeless tobacco: Never  Substance Use Topics   Alcohol use: No    CODE STATUS: To be assessed. ADVANCED DIRECTIVES: To be assessed MOST FORM COMPLETE:  To be assessed HOSPICE EDUCATION PROVIDED: None  PPS: Patient is dependent for all  ADL's.  Duration of visit and documentation: 45 minutes Cheryl Llano, LCSW

## 2020-11-12 DIAGNOSIS — I5032 Chronic diastolic (congestive) heart failure: Secondary | ICD-10-CM | POA: Diagnosis not present

## 2020-11-12 DIAGNOSIS — I482 Chronic atrial fibrillation, unspecified: Secondary | ICD-10-CM | POA: Diagnosis not present

## 2020-11-12 DIAGNOSIS — F329 Major depressive disorder, single episode, unspecified: Secondary | ICD-10-CM | POA: Diagnosis not present

## 2020-11-12 DIAGNOSIS — N390 Urinary tract infection, site not specified: Secondary | ICD-10-CM | POA: Diagnosis not present

## 2020-11-12 DIAGNOSIS — N3020 Other chronic cystitis without hematuria: Secondary | ICD-10-CM | POA: Diagnosis not present

## 2020-11-12 DIAGNOSIS — F0390 Unspecified dementia without behavioral disturbance: Secondary | ICD-10-CM | POA: Diagnosis not present

## 2020-11-13 DIAGNOSIS — R059 Cough, unspecified: Secondary | ICD-10-CM | POA: Diagnosis not present

## 2020-11-13 DIAGNOSIS — I5032 Chronic diastolic (congestive) heart failure: Secondary | ICD-10-CM | POA: Diagnosis not present

## 2020-11-13 DIAGNOSIS — N3020 Other chronic cystitis without hematuria: Secondary | ICD-10-CM | POA: Diagnosis not present

## 2020-11-13 DIAGNOSIS — I482 Chronic atrial fibrillation, unspecified: Secondary | ICD-10-CM | POA: Diagnosis not present

## 2020-11-13 DIAGNOSIS — K219 Gastro-esophageal reflux disease without esophagitis: Secondary | ICD-10-CM | POA: Diagnosis not present

## 2020-11-13 DIAGNOSIS — F0390 Unspecified dementia without behavioral disturbance: Secondary | ICD-10-CM | POA: Diagnosis not present

## 2020-11-17 DIAGNOSIS — F419 Anxiety disorder, unspecified: Secondary | ICD-10-CM | POA: Diagnosis not present

## 2020-11-17 DIAGNOSIS — E1122 Type 2 diabetes mellitus with diabetic chronic kidney disease: Secondary | ICD-10-CM | POA: Diagnosis not present

## 2020-11-17 DIAGNOSIS — I10 Essential (primary) hypertension: Secondary | ICD-10-CM | POA: Diagnosis not present

## 2020-11-17 DIAGNOSIS — I639 Cerebral infarction, unspecified: Secondary | ICD-10-CM | POA: Diagnosis not present

## 2020-11-17 DIAGNOSIS — F0151 Vascular dementia with behavioral disturbance: Secondary | ICD-10-CM | POA: Diagnosis not present

## 2020-11-17 DIAGNOSIS — I482 Chronic atrial fibrillation, unspecified: Secondary | ICD-10-CM | POA: Diagnosis not present

## 2020-11-17 DIAGNOSIS — E039 Hypothyroidism, unspecified: Secondary | ICD-10-CM | POA: Diagnosis not present

## 2020-11-17 DIAGNOSIS — G4700 Insomnia, unspecified: Secondary | ICD-10-CM | POA: Diagnosis not present

## 2020-11-17 DIAGNOSIS — N1832 Chronic kidney disease, stage 3b: Secondary | ICD-10-CM | POA: Diagnosis not present

## 2020-11-17 DIAGNOSIS — F331 Major depressive disorder, recurrent, moderate: Secondary | ICD-10-CM | POA: Diagnosis not present

## 2020-11-17 DIAGNOSIS — G301 Alzheimer's disease with late onset: Secondary | ICD-10-CM | POA: Diagnosis not present

## 2020-11-17 DIAGNOSIS — K219 Gastro-esophageal reflux disease without esophagitis: Secondary | ICD-10-CM | POA: Diagnosis not present

## 2020-11-17 DIAGNOSIS — F0150 Vascular dementia without behavioral disturbance: Secondary | ICD-10-CM | POA: Diagnosis not present

## 2020-11-18 ENCOUNTER — Encounter (HOSPITAL_COMMUNITY): Payer: Self-pay | Admitting: Emergency Medicine

## 2020-11-18 ENCOUNTER — Other Ambulatory Visit: Payer: Self-pay

## 2020-11-18 ENCOUNTER — Emergency Department (HOSPITAL_COMMUNITY): Payer: Medicare PPO

## 2020-11-18 ENCOUNTER — Inpatient Hospital Stay (HOSPITAL_COMMUNITY)
Admission: EM | Admit: 2020-11-18 | Discharge: 2020-11-23 | DRG: 193 | Disposition: A | Discharge status: Skilled Nursing Facility | Payer: Medicare PPO | Source: Skilled Nursing Facility | Attending: Internal Medicine | Admitting: Internal Medicine

## 2020-11-18 DIAGNOSIS — M4854XA Collapsed vertebra, not elsewhere classified, thoracic region, initial encounter for fracture: Secondary | ICD-10-CM | POA: Diagnosis present

## 2020-11-18 DIAGNOSIS — J209 Acute bronchitis, unspecified: Secondary | ICD-10-CM | POA: Diagnosis present

## 2020-11-18 DIAGNOSIS — R0902 Hypoxemia: Secondary | ICD-10-CM | POA: Diagnosis not present

## 2020-11-18 DIAGNOSIS — E7800 Pure hypercholesterolemia, unspecified: Secondary | ICD-10-CM | POA: Diagnosis not present

## 2020-11-18 DIAGNOSIS — E1122 Type 2 diabetes mellitus with diabetic chronic kidney disease: Secondary | ICD-10-CM

## 2020-11-18 DIAGNOSIS — I2510 Atherosclerotic heart disease of native coronary artery without angina pectoris: Secondary | ICD-10-CM | POA: Diagnosis present

## 2020-11-18 DIAGNOSIS — E1151 Type 2 diabetes mellitus with diabetic peripheral angiopathy without gangrene: Secondary | ICD-10-CM | POA: Diagnosis present

## 2020-11-18 DIAGNOSIS — E039 Hypothyroidism, unspecified: Secondary | ICD-10-CM | POA: Diagnosis present

## 2020-11-18 DIAGNOSIS — I714 Abdominal aortic aneurysm, without rupture: Secondary | ICD-10-CM | POA: Diagnosis present

## 2020-11-18 DIAGNOSIS — I5033 Acute on chronic diastolic (congestive) heart failure: Secondary | ICD-10-CM | POA: Diagnosis present

## 2020-11-18 DIAGNOSIS — Z833 Family history of diabetes mellitus: Secondary | ICD-10-CM

## 2020-11-18 DIAGNOSIS — I4891 Unspecified atrial fibrillation: Secondary | ICD-10-CM | POA: Diagnosis not present

## 2020-11-18 DIAGNOSIS — I679 Cerebrovascular disease, unspecified: Secondary | ICD-10-CM | POA: Diagnosis present

## 2020-11-18 DIAGNOSIS — Z66 Do not resuscitate: Secondary | ICD-10-CM | POA: Diagnosis present

## 2020-11-18 DIAGNOSIS — I480 Paroxysmal atrial fibrillation: Secondary | ICD-10-CM | POA: Diagnosis present

## 2020-11-18 DIAGNOSIS — N1832 Chronic kidney disease, stage 3b: Secondary | ICD-10-CM | POA: Diagnosis present

## 2020-11-18 DIAGNOSIS — Z7401 Bed confinement status: Secondary | ICD-10-CM | POA: Diagnosis not present

## 2020-11-18 DIAGNOSIS — N3000 Acute cystitis without hematuria: Secondary | ICD-10-CM

## 2020-11-18 DIAGNOSIS — I69998 Other sequelae following unspecified cerebrovascular disease: Secondary | ICD-10-CM | POA: Diagnosis not present

## 2020-11-18 DIAGNOSIS — E785 Hyperlipidemia, unspecified: Secondary | ICD-10-CM | POA: Diagnosis present

## 2020-11-18 DIAGNOSIS — R4182 Altered mental status, unspecified: Secondary | ICD-10-CM

## 2020-11-18 DIAGNOSIS — Z79899 Other long term (current) drug therapy: Secondary | ICD-10-CM

## 2020-11-18 DIAGNOSIS — Z6826 Body mass index (BMI) 26.0-26.9, adult: Secondary | ICD-10-CM

## 2020-11-18 DIAGNOSIS — G40909 Epilepsy, unspecified, not intractable, without status epilepticus: Secondary | ICD-10-CM | POA: Diagnosis present

## 2020-11-18 DIAGNOSIS — M069 Rheumatoid arthritis, unspecified: Secondary | ICD-10-CM | POA: Diagnosis present

## 2020-11-18 DIAGNOSIS — R0602 Shortness of breath: Secondary | ICD-10-CM | POA: Diagnosis not present

## 2020-11-18 DIAGNOSIS — N390 Urinary tract infection, site not specified: Secondary | ICD-10-CM | POA: Diagnosis present

## 2020-11-18 DIAGNOSIS — Z7901 Long term (current) use of anticoagulants: Secondary | ICD-10-CM

## 2020-11-18 DIAGNOSIS — I739 Peripheral vascular disease, unspecified: Secondary | ICD-10-CM

## 2020-11-18 DIAGNOSIS — N183 Chronic kidney disease, stage 3 unspecified: Secondary | ICD-10-CM | POA: Diagnosis present

## 2020-11-18 DIAGNOSIS — Z87440 Personal history of urinary (tract) infections: Secondary | ICD-10-CM

## 2020-11-18 DIAGNOSIS — Z20822 Contact with and (suspected) exposure to covid-19: Secondary | ICD-10-CM | POA: Diagnosis present

## 2020-11-18 DIAGNOSIS — N3020 Other chronic cystitis without hematuria: Secondary | ICD-10-CM | POA: Diagnosis not present

## 2020-11-18 DIAGNOSIS — J9811 Atelectasis: Secondary | ICD-10-CM | POA: Diagnosis not present

## 2020-11-18 DIAGNOSIS — R109 Unspecified abdominal pain: Secondary | ICD-10-CM | POA: Diagnosis not present

## 2020-11-18 DIAGNOSIS — I482 Chronic atrial fibrillation, unspecified: Secondary | ICD-10-CM | POA: Diagnosis not present

## 2020-11-18 DIAGNOSIS — Z96653 Presence of artificial knee joint, bilateral: Secondary | ICD-10-CM | POA: Diagnosis present

## 2020-11-18 DIAGNOSIS — K219 Gastro-esophageal reflux disease without esophagitis: Secondary | ICD-10-CM | POA: Diagnosis present

## 2020-11-18 DIAGNOSIS — I719 Aortic aneurysm of unspecified site, without rupture: Secondary | ICD-10-CM | POA: Diagnosis not present

## 2020-11-18 DIAGNOSIS — J9601 Acute respiratory failure with hypoxia: Secondary | ICD-10-CM | POA: Diagnosis present

## 2020-11-18 DIAGNOSIS — Z95828 Presence of other vascular implants and grafts: Secondary | ICD-10-CM | POA: Diagnosis not present

## 2020-11-18 DIAGNOSIS — R0689 Other abnormalities of breathing: Secondary | ICD-10-CM | POA: Diagnosis not present

## 2020-11-18 DIAGNOSIS — Z951 Presence of aortocoronary bypass graft: Secondary | ICD-10-CM

## 2020-11-18 DIAGNOSIS — Z8249 Family history of ischemic heart disease and other diseases of the circulatory system: Secondary | ICD-10-CM

## 2020-11-18 DIAGNOSIS — I959 Hypotension, unspecified: Secondary | ICD-10-CM | POA: Diagnosis not present

## 2020-11-18 DIAGNOSIS — F0390 Unspecified dementia without behavioral disturbance: Secondary | ICD-10-CM | POA: Diagnosis not present

## 2020-11-18 DIAGNOSIS — E663 Overweight: Secondary | ICD-10-CM | POA: Diagnosis present

## 2020-11-18 DIAGNOSIS — E1129 Type 2 diabetes mellitus with other diabetic kidney complication: Secondary | ICD-10-CM | POA: Diagnosis present

## 2020-11-18 DIAGNOSIS — I10 Essential (primary) hypertension: Secondary | ICD-10-CM | POA: Diagnosis not present

## 2020-11-18 DIAGNOSIS — R059 Cough, unspecified: Secondary | ICD-10-CM | POA: Diagnosis not present

## 2020-11-18 DIAGNOSIS — I130 Hypertensive heart and chronic kidney disease with heart failure and stage 1 through stage 4 chronic kidney disease, or unspecified chronic kidney disease: Secondary | ICD-10-CM | POA: Diagnosis present

## 2020-11-18 DIAGNOSIS — Z820 Family history of epilepsy and other diseases of the nervous system: Secondary | ICD-10-CM

## 2020-11-18 DIAGNOSIS — G9341 Metabolic encephalopathy: Secondary | ICD-10-CM | POA: Diagnosis present

## 2020-11-18 DIAGNOSIS — F0150 Vascular dementia without behavioral disturbance: Secondary | ICD-10-CM | POA: Diagnosis present

## 2020-11-18 DIAGNOSIS — J189 Pneumonia, unspecified organism: Secondary | ICD-10-CM | POA: Diagnosis present

## 2020-11-18 DIAGNOSIS — I491 Atrial premature depolarization: Secondary | ICD-10-CM | POA: Diagnosis not present

## 2020-11-18 DIAGNOSIS — I517 Cardiomegaly: Secondary | ICD-10-CM | POA: Diagnosis not present

## 2020-11-18 DIAGNOSIS — I5032 Chronic diastolic (congestive) heart failure: Secondary | ICD-10-CM | POA: Diagnosis present

## 2020-11-18 DIAGNOSIS — Z86718 Personal history of other venous thrombosis and embolism: Secondary | ICD-10-CM

## 2020-11-18 LAB — CBC WITH DIFFERENTIAL/PLATELET
Abs Immature Granulocytes: 0.02 10*3/uL (ref 0.00–0.07)
Basophils Absolute: 0.1 10*3/uL (ref 0.0–0.1)
Basophils Relative: 1 %
Eosinophils Absolute: 0.4 10*3/uL (ref 0.0–0.5)
Eosinophils Relative: 5 %
HCT: 43.7 % (ref 36.0–46.0)
Hemoglobin: 13.6 g/dL (ref 12.0–15.0)
Immature Granulocytes: 0 %
Lymphocytes Relative: 30 %
Lymphs Abs: 2.8 10*3/uL (ref 0.7–4.0)
MCH: 31.2 pg (ref 26.0–34.0)
MCHC: 31.1 g/dL (ref 30.0–36.0)
MCV: 100.2 fL — ABNORMAL HIGH (ref 80.0–100.0)
Monocytes Absolute: 0.8 10*3/uL (ref 0.1–1.0)
Monocytes Relative: 9 %
Neutro Abs: 5.4 10*3/uL (ref 1.7–7.7)
Neutrophils Relative %: 55 %
Platelets: 165 10*3/uL (ref 150–400)
RBC: 4.36 MIL/uL (ref 3.87–5.11)
RDW: 14.6 % (ref 11.5–15.5)
WBC: 9.6 10*3/uL (ref 4.0–10.5)
nRBC: 0 % (ref 0.0–0.2)

## 2020-11-18 LAB — URINALYSIS, ROUTINE W REFLEX MICROSCOPIC
Bilirubin Urine: NEGATIVE
Glucose, UA: NEGATIVE mg/dL
Hgb urine dipstick: NEGATIVE
Ketones, ur: NEGATIVE mg/dL
Nitrite: NEGATIVE
Protein, ur: NEGATIVE mg/dL
Specific Gravity, Urine: 1.012 (ref 1.005–1.030)
WBC, UA: >50 WBC/hpf — ABNORMAL HIGH (ref 0–5)
pH: 5 (ref 5.0–8.0)

## 2020-11-18 LAB — COMPREHENSIVE METABOLIC PANEL
ALT: 11 U/L (ref 0–44)
AST: 17 U/L (ref 15–41)
Albumin: 3.2 g/dL — ABNORMAL LOW (ref 3.5–5.0)
Alkaline Phosphatase: 68 U/L (ref 38–126)
Anion gap: 10 (ref 5–15)
BUN: 25 mg/dL — ABNORMAL HIGH (ref 8–23)
CO2: 27 mmol/L (ref 22–32)
Calcium: 10.1 mg/dL (ref 8.9–10.3)
Chloride: 101 mmol/L (ref 98–111)
Creatinine, Ser: 1.27 mg/dL — ABNORMAL HIGH (ref 0.44–1.00)
GFR, Estimated: 41 mL/min — ABNORMAL LOW (ref >60)
Glucose, Bld: 132 mg/dL — ABNORMAL HIGH (ref 70–99)
Potassium: 4.3 mmol/L (ref 3.5–5.1)
Sodium: 138 mmol/L (ref 135–145)
Total Bilirubin: 0.8 mg/dL (ref 0.3–1.2)
Total Protein: 8.3 g/dL — ABNORMAL HIGH (ref 6.5–8.1)

## 2020-11-18 LAB — TROPONIN I (HIGH SENSITIVITY)
Troponin I (High Sensitivity): 23 ng/L — ABNORMAL HIGH (ref <18)
Troponin I (High Sensitivity): 18 ng/L — ABNORMAL HIGH (ref <18)

## 2020-11-18 LAB — CBG MONITORING, ED: Glucose-Capillary: 210 mg/dL — ABNORMAL HIGH (ref 70–99)

## 2020-11-18 LAB — RESP PANEL BY RT-PCR (FLU A&B, COVID) ARPGX2
Influenza A by PCR: NEGATIVE
Influenza B by PCR: NEGATIVE
SARS Coronavirus 2 by RT PCR: NEGATIVE

## 2020-11-18 LAB — BRAIN NATRIURETIC PEPTIDE: B Natriuretic Peptide: 400.7 pg/mL — ABNORMAL HIGH (ref 0.0–100.0)

## 2020-11-18 IMAGING — DX DG CHEST 1V PORT
1 series · 1 of 1 positions shown · non-contrast
Comparison: [DATE]

CLINICAL DATA: Cough and hypoxia.

EXAM:
PORTABLE CHEST 1 VIEW

[chest]
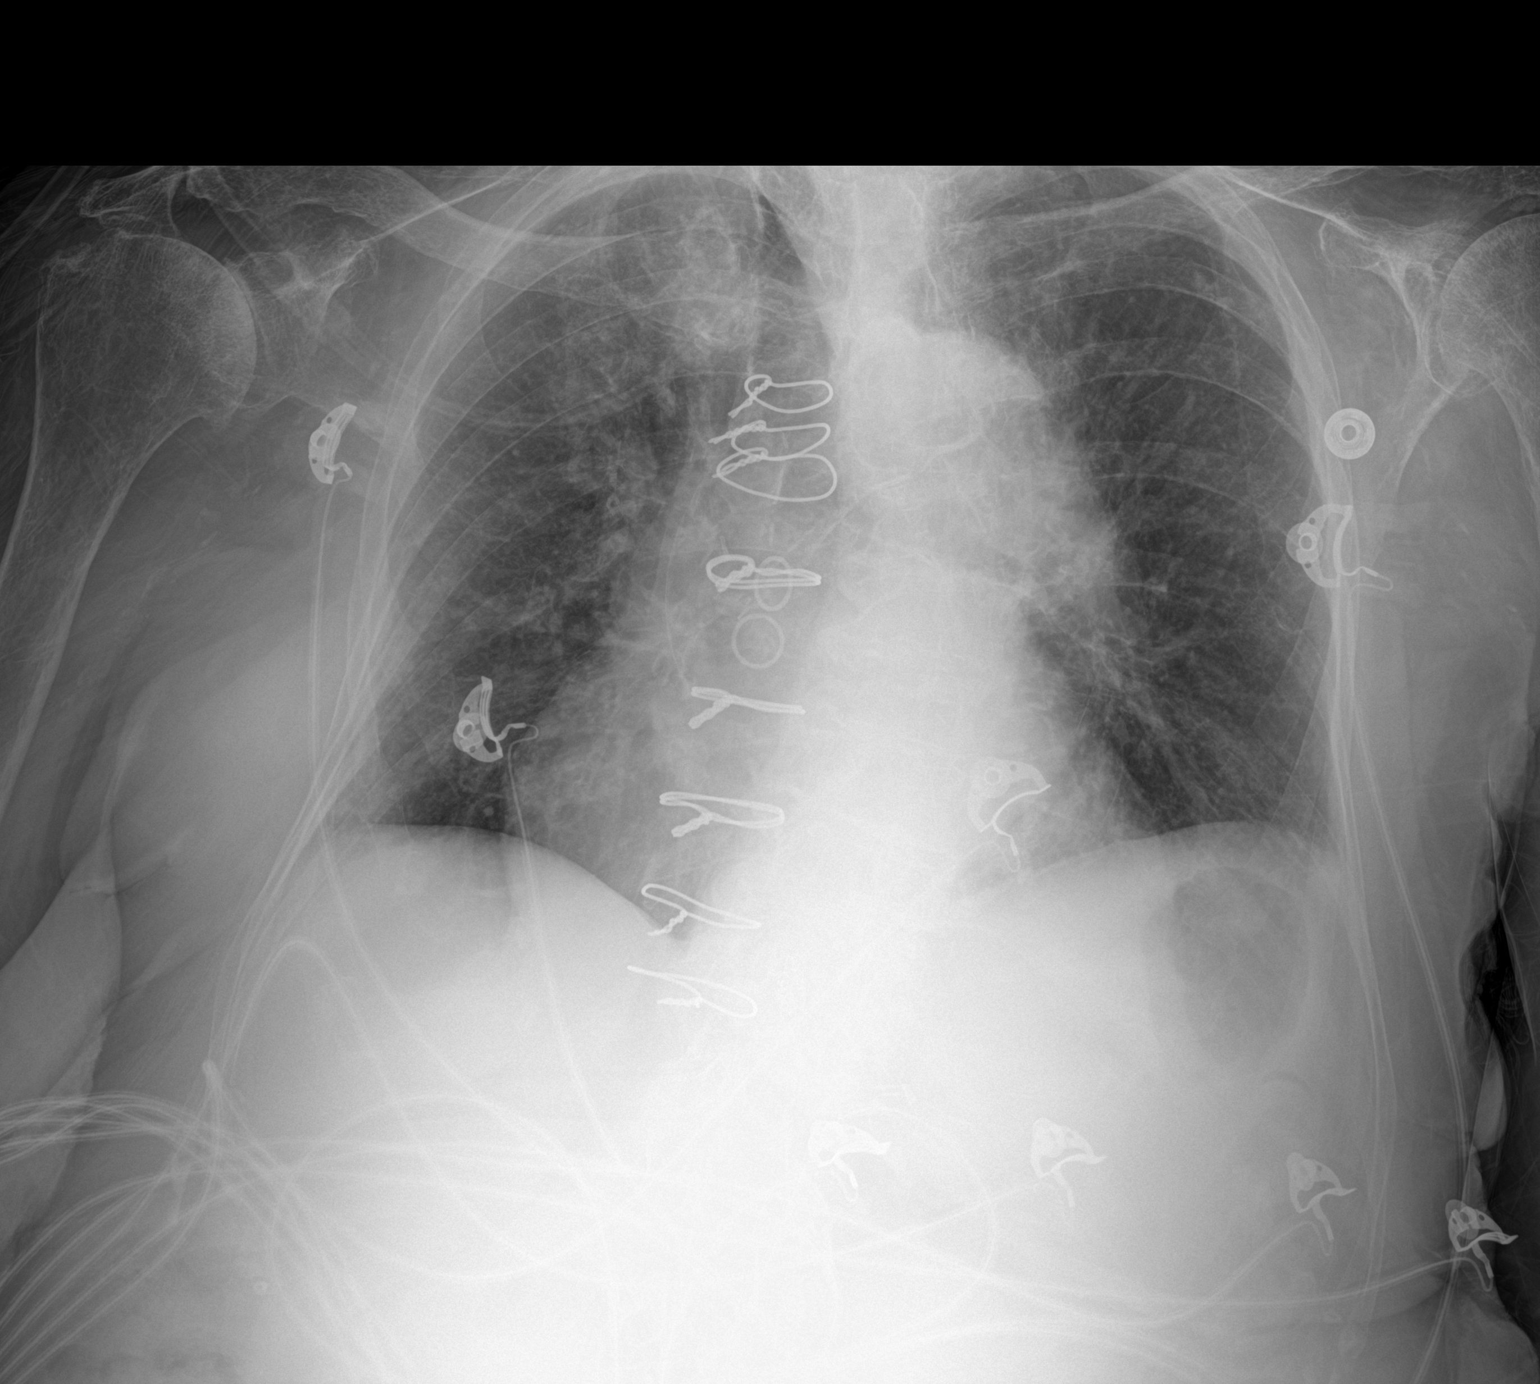

[1 of 1 positions shown; findings below may reference images not displayed]

FINDINGS: Previous median sternotomy and CABG procedure. Stable
cardiomediastinal contours. No signs of pleural effusion or edema.
No airspace consolidation. The visualized osseous structures are
unremarkable.
IMPRESSION: No acute cardiopulmonary abnormalities.

## 2020-11-18 IMAGING — CT CT ANGIO CHEST
2 of 6 series · 18 of 36 positions shown · IV contrast (omnipaque)
Comparison: [DATE]

CLINICAL DATA: 89-year-old female with cough, shortness of breath,
hypoxia, concern for pulmonary embolism.

EXAM:
CT ANGIOGRAPHY CHEST WITH CONTRAST
TECHNIQUE: Multidetector CT imaging of the chest was performed using the
standard protocol during bolus administration of intravenous
contrast. Multiplanar CT image reconstructions and MIPs were
obtained to evaluate the vascular anatomy.
CONTRAST:  Sixty mL Omnipaque 350, intravenous

[Series 7: pe thins · axial · 0.62mm/px · z∈[-215,+28]mm · 17 of 386 slices shown]
[im 20/386  lung]
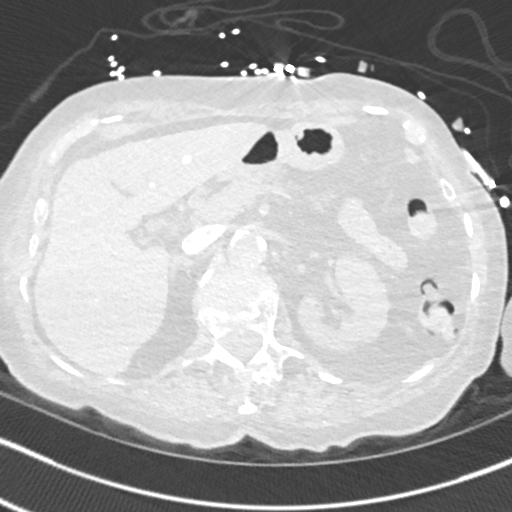
[im 39/386  mediastinal]
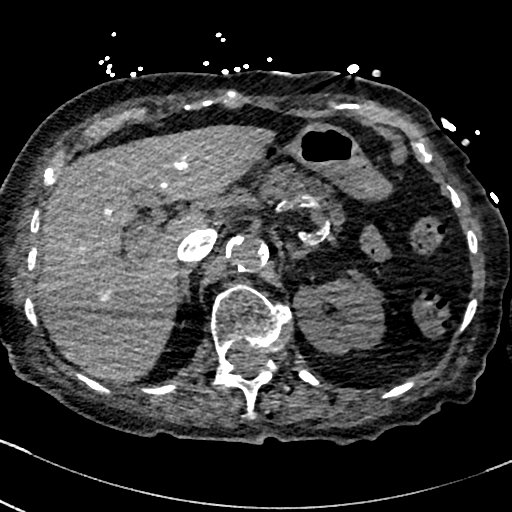
[im 58/386  lung]
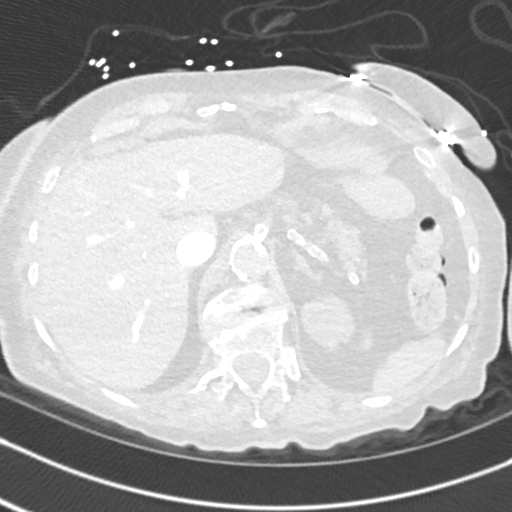
[im 78/386  mediastinal]
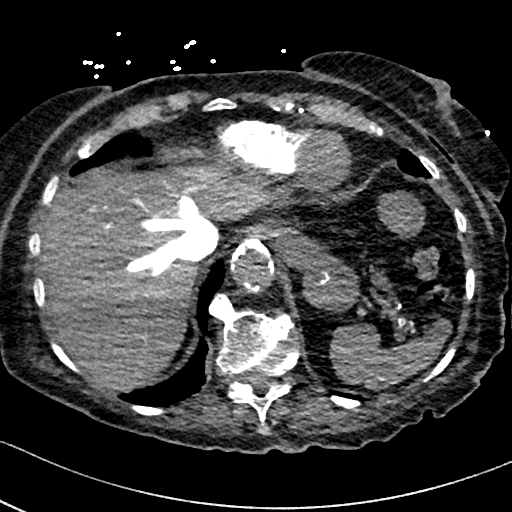
[im 116/386  lung]
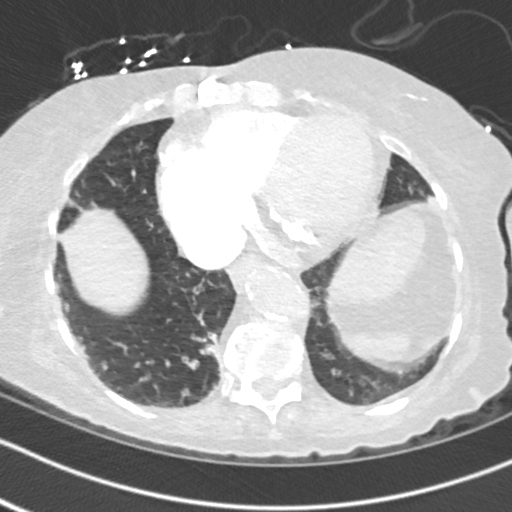
[im 135/386  mediastinal]
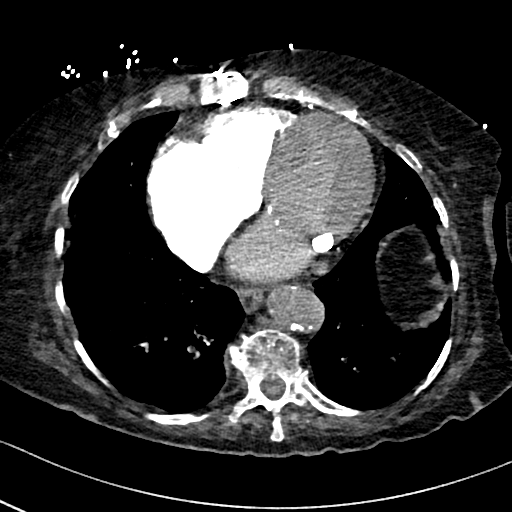
[im 155/386  lung]
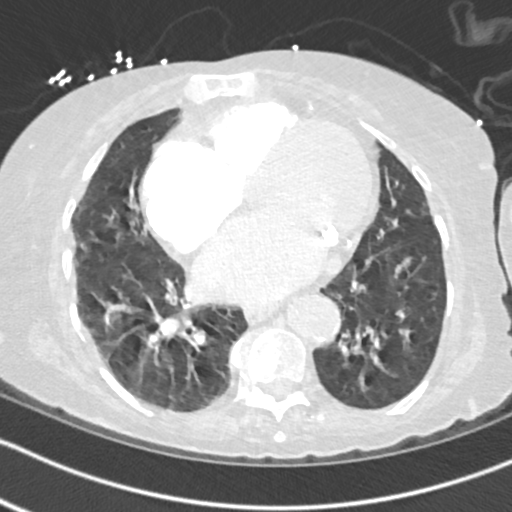
[im 174/386  mediastinal]
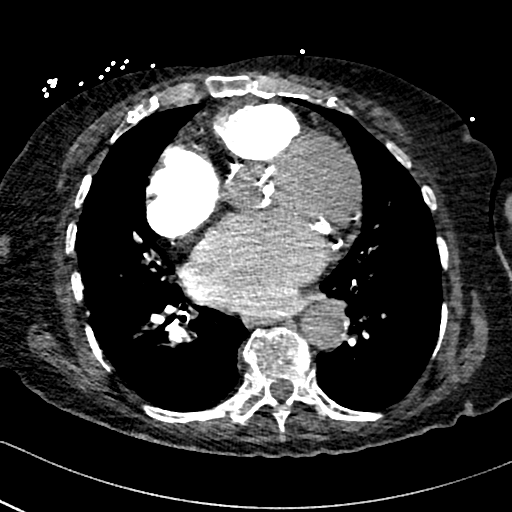
[im 193/386  lung]
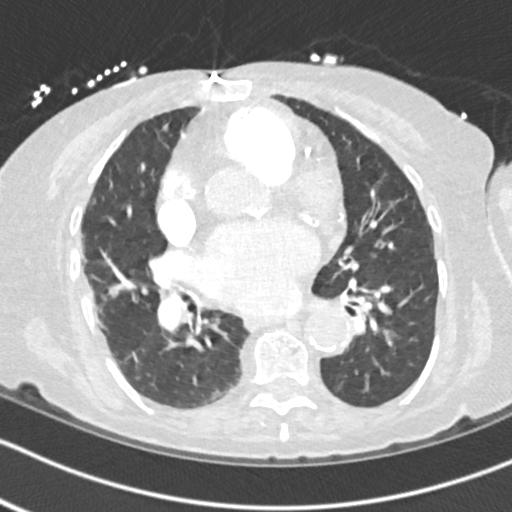
[im 212/386  mediastinal]
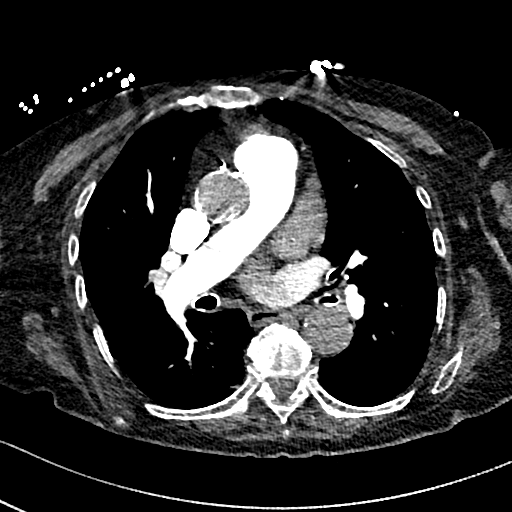
[im 232/386  lung]
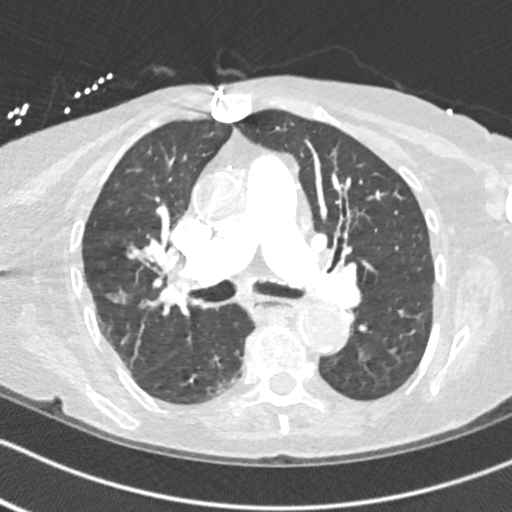
[im 251/386  mediastinal]
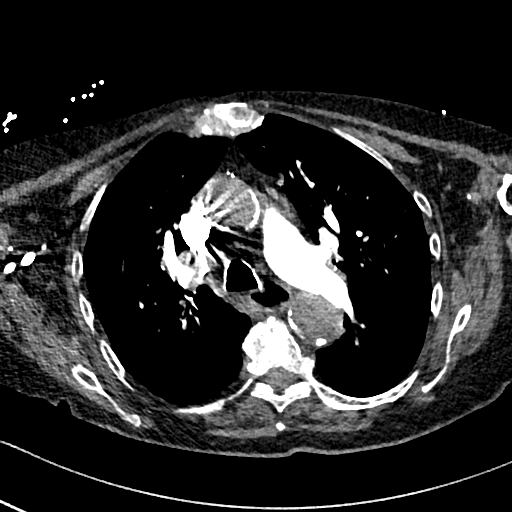
[im 270/386  lung]
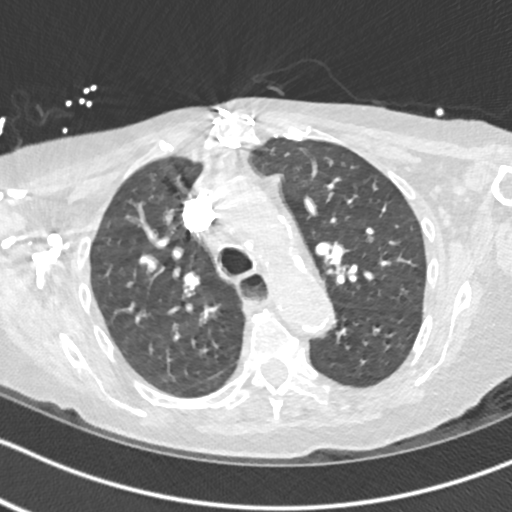
[im 309/386  mediastinal]
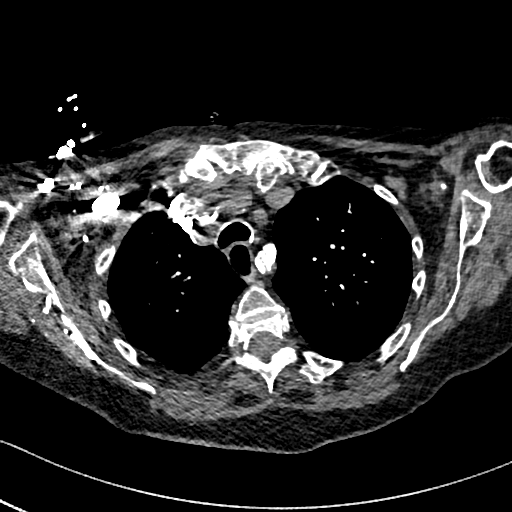
[im 328/386  lung]
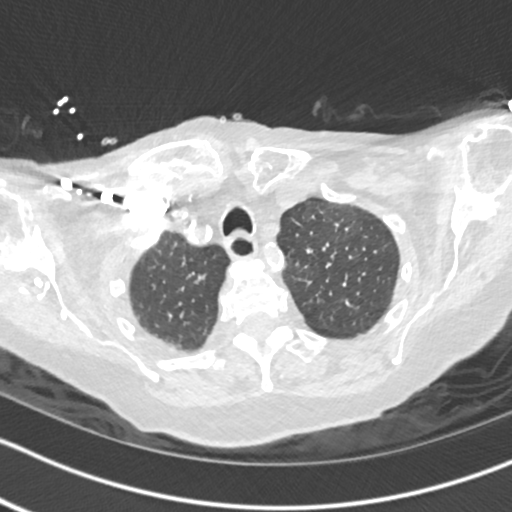
[im 347/386  mediastinal]
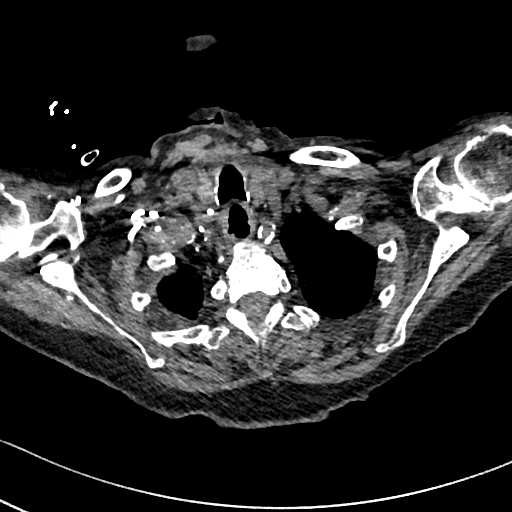
[im 366/386  lung]
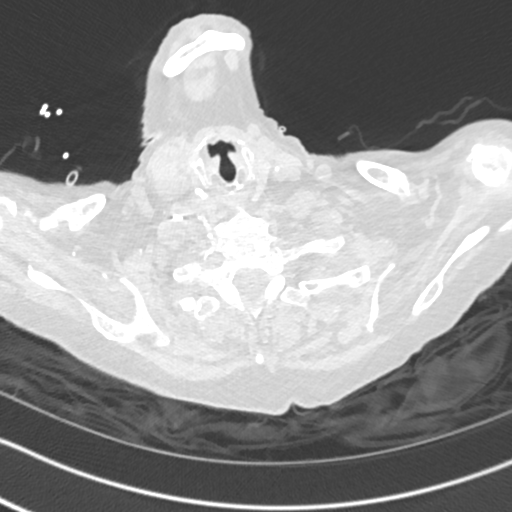

[Series 8: pe 2mm cor · coronal · 0.54mm/px · 1 of 123 slices shown]
[im 62/123  mediastinal]
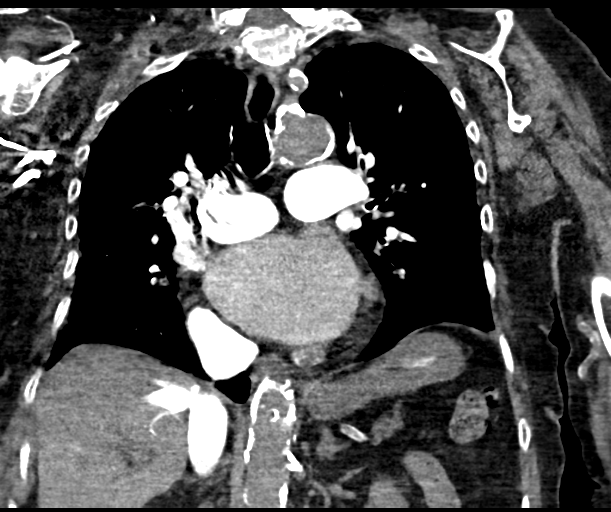

[18 of 36 positions shown; findings below may reference images not displayed]

FINDINGS: Cardiovascular: Satisfactory opacification of the pulmonary arteries
to the segmental level. No evidence of pulmonary embolism. Severe
biatrial cardiomegaly. Severe mitral annular calcifications. Aortic
valvular calcifications are present. Severe native coronary
atherosclerotic calcifications. Postsurgical changes after coronary
artery bypass graft. Scattered atherosclerotic calcifications of the
normal caliber thoracic aorta. No pericardial effusion.

Mediastinum/Nodes: No enlarged mediastinal, hilar, or axillary lymph
nodes. Thyroid gland, trachea, and esophagus demonstrate no
significant findings.

Lungs/Pleura: Minimal basal and dependent interlobular septal
thickening. No overt pulmonary edema. No pleural effusions or
pneumothorax. No focal consolidations. No suspicious pulmonary
nodules.

Upper Abdomen: The visualized upper abdomen is within normal limits.

Musculoskeletal: Sigmoid curvature of the thoracic spine.
Age-indeterminate anterior wedge compression deformity of the T6
vertebral body. No retropulsion. Status post median sternotomy.

Review of the MIP images confirms the above findings.
IMPRESSION: Vascular:

1. No evidence of pulmonary embolism.
2. Severe biatrial cardiomegaly.
3. Coronary and aortic atherosclerosis ([ZO]-[ZO]).

Non-Vascular:

1. Trace pulmonary edema, likely cardiogenic.
2. Age-indeterminate anterior wedge compression deformity of the T6
vertebral body, no retropulsion. If there is clinical concern for
acuity, recommend thoracic spine MRI further evaluation.

## 2020-11-18 MED ORDER — ACETAMINOPHEN 650 MG RE SUPP: 650.0000 mg | Freq: Four times a day (QID) | RECTAL | Status: DC | PRN

## 2020-11-18 MED ORDER — INSULIN ASPART 100 UNIT/ML IJ SOLN
0.0000 [IU] | Freq: Three times a day (TID) | INTRAMUSCULAR | Status: DC
  Administered 2020-11-19: 1 [IU] via SUBCUTANEOUS
  Administered 2020-11-19: 2 [IU] via SUBCUTANEOUS
  Administered 2020-11-20 (×3): 1 [IU] via SUBCUTANEOUS
  Administered 2020-11-21: 2 [IU] via SUBCUTANEOUS
  Administered 2020-11-21: 1 [IU] via SUBCUTANEOUS
  Administered 2020-11-21: 2 [IU] via SUBCUTANEOUS
  Administered 2020-11-22 (×2): 1 [IU] via SUBCUTANEOUS
  Administered 2020-11-23 (×2): 3 [IU] via SUBCUTANEOUS

## 2020-11-18 MED ORDER — METOPROLOL SUCCINATE ER 25 MG PO TB24
25.0000 mg | ORAL_TABLET | Freq: Every day | ORAL | Status: DC
  Administered 2020-11-19 – 2020-11-23 (×5): 25 mg via ORAL
  Filled 2020-11-18 (×5): qty 1

## 2020-11-18 MED ORDER — POLYVINYL ALCOHOL 1.4 % OP SOLN
1.0000 [drp] | Freq: Four times a day (QID) | OPHTHALMIC | Status: DC
  Administered 2020-11-18 – 2020-11-23 (×18): 1 [drp] via OPHTHALMIC
  Filled 2020-11-18: qty 15

## 2020-11-18 MED ORDER — VANCOMYCIN HCL 1500 MG/300ML IV SOLN
1500.0000 mg | Freq: Once | INTRAVENOUS | Status: AC
  Administered 2020-11-18: 1500 mg via INTRAVENOUS
  Filled 2020-11-18: qty 300

## 2020-11-18 MED ORDER — MIRTAZAPINE 15 MG PO TABS
7.5000 mg | ORAL_TABLET | Freq: Every day | ORAL | Status: DC
  Administered 2020-11-18 – 2020-11-22 (×5): 7.5 mg via ORAL
  Filled 2020-11-18 (×6): qty 1

## 2020-11-18 MED ORDER — LEVOTHYROXINE SODIUM 75 MCG PO TABS
75.0000 ug | ORAL_TABLET | Freq: Every day | ORAL | Status: DC
  Administered 2020-11-19 – 2020-11-23 (×5): 75 ug via ORAL
  Filled 2020-11-18 (×6): qty 1

## 2020-11-18 MED ORDER — LOPERAMIDE HCL 2 MG PO CAPS: 4.0000 mg | ORAL_CAPSULE | Freq: Every day | ORAL | Status: DC | PRN

## 2020-11-18 MED ORDER — SODIUM CHLORIDE 0.9% FLUSH
3.0000 mL | Freq: Two times a day (BID) | INTRAVENOUS | Status: DC
  Administered 2020-11-18 – 2020-11-23 (×10): 3 mL via INTRAVENOUS

## 2020-11-18 MED ORDER — PANTOPRAZOLE SODIUM 40 MG PO TBEC
40.0000 mg | DELAYED_RELEASE_TABLET | Freq: Every day | ORAL | Status: DC
  Administered 2020-11-19 – 2020-11-22 (×4): 40 mg via ORAL
  Filled 2020-11-18 (×4): qty 1

## 2020-11-18 MED ORDER — METHENAMINE MANDELATE 1 G PO TABS
500.0000 mg | ORAL_TABLET | Freq: Two times a day (BID) | ORAL | Status: DC
Start: 2020-11-19 — End: 2020-11-23
  Administered 2020-11-19 – 2020-11-23 (×9): 500 mg via ORAL
  Filled 2020-11-18 (×11): qty 1

## 2020-11-18 MED ORDER — PRAVASTATIN SODIUM 40 MG PO TABS
40.0000 mg | ORAL_TABLET | Freq: Every day | ORAL | Status: DC
  Administered 2020-11-19 – 2020-11-22 (×4): 40 mg via ORAL
  Filled 2020-11-18 (×4): qty 1

## 2020-11-18 MED ORDER — POLYETHYLENE GLYCOL 3350 17 G PO PACK
17.0000 g | PACK | Freq: Every day | ORAL | Status: DC | PRN
  Administered 2020-11-20 – 2020-11-22 (×2): 17 g via ORAL
  Filled 2020-11-18 (×2): qty 1

## 2020-11-18 MED ORDER — DOCUSATE SODIUM 100 MG PO CAPS
100.0000 mg | ORAL_CAPSULE | Freq: Two times a day (BID) | ORAL | Status: DC
  Administered 2020-11-18 – 2020-11-23 (×10): 100 mg via ORAL
  Filled 2020-11-18 (×10): qty 1

## 2020-11-18 MED ORDER — LEVETIRACETAM 250 MG PO TABS
250.0000 mg | ORAL_TABLET | Freq: Every day | ORAL | Status: DC
  Administered 2020-11-18 – 2020-11-22 (×5): 250 mg via ORAL
  Filled 2020-11-18 (×6): qty 1

## 2020-11-18 MED ORDER — DILTIAZEM HCL ER COATED BEADS 180 MG PO CP24
180.0000 mg | ORAL_CAPSULE | Freq: Every day | ORAL | Status: DC
  Administered 2020-11-18 – 2020-11-22 (×5): 180 mg via ORAL
  Filled 2020-11-18 (×6): qty 1

## 2020-11-18 MED ORDER — ACETAMINOPHEN 325 MG PO TABS: 650.0000 mg | ORAL_TABLET | Freq: Four times a day (QID) | ORAL | Status: DC | PRN

## 2020-11-18 MED ORDER — DEXTROSE 5 % IV SOLN
0.5000 g | Freq: Three times a day (TID) | INTRAVENOUS | Status: DC
  Administered 2020-11-18 – 2020-11-21 (×8): 0.5 g via INTRAVENOUS
  Filled 2020-11-18 (×14): qty 0.5

## 2020-11-18 MED ORDER — CALCIUM CARBONATE-VITAMIN D 500-200 MG-UNIT PO TABS
1.0000 | ORAL_TABLET | Freq: Two times a day (BID) | ORAL | Status: DC
  Administered 2020-11-19 – 2020-11-23 (×10): 1 via ORAL
  Filled 2020-11-18 (×12): qty 1

## 2020-11-18 MED ORDER — IOHEXOL 350 MG/ML SOLN
60.0000 mL | Freq: Once | INTRAVENOUS | Status: AC | PRN
  Administered 2020-11-18: 60 mL via INTRAVENOUS

## 2020-11-18 MED ORDER — ENOXAPARIN SODIUM 40 MG/0.4ML IJ SOSY: 40.0000 mg | PREFILLED_SYRINGE | INTRAMUSCULAR | Status: DC

## 2020-11-18 MED ORDER — VANCOMYCIN HCL 1250 MG/250ML IV SOLN: 1250.0000 mg | INTRAVENOUS | Status: DC

## 2020-11-18 MED ORDER — MEMANTINE HCL 10 MG PO TABS
10.0000 mg | ORAL_TABLET | Freq: Every day | ORAL | Status: DC
  Administered 2020-11-19 – 2020-11-23 (×5): 10 mg via ORAL
  Filled 2020-11-18 (×6): qty 1

## 2020-11-18 MED ORDER — FUROSEMIDE 10 MG/ML IJ SOLN
20.0000 mg | Freq: Once | INTRAMUSCULAR | Status: AC
  Administered 2020-11-18: 20 mg via INTRAVENOUS
  Filled 2020-11-18: qty 2

## 2020-11-18 MED ORDER — DABIGATRAN ETEXILATE MESYLATE 75 MG PO CAPS
75.0000 mg | ORAL_CAPSULE | Freq: Two times a day (BID) | ORAL | Status: DC
  Administered 2020-11-18 – 2020-11-23 (×10): 75 mg via ORAL
  Filled 2020-11-18 (×12): qty 1

## 2020-11-18 NOTE — Progress Notes (Signed)
Pharmacy Antibiotic Note  [name] is a 87 y.o. female admitted on 11/18/2020 with UTI.  Pharmacy has been consulted for vancomycin and aztreonam dosing. Scr is at [facility] now, CTM.  Plan: Aztreonam 500 mg IV q8h Vancomycin 1500 mg IV x1 Vancomycin 1250 mg IV Q 48 hrs. Goal AUC 400-550. Expected AUC: 538.5 SCr used: 1.27 Monitor renal function, clinical status, and abx plan.   Height: 5\' 2"  (157.5 cm) (self reported) Weight: 65.8 kg (145 lb) (self reported) IBW/kg (Calculated) : 50.1  Temp (24hrs), Avg:97.4 F (36.3 C), Min:97.4 F (36.3 C), Max:97.4 F (36.3 C)  Recent Labs  Lab 11/18/20 1250  WBC 9.6  CREATININE 1.27*    Estimated Creatinine Clearance: 27.3 mL/min (A) (by C-G formula based on SCr of 1.27 mg/dL (H)).    Allergies  Allergen Reactions   Cefuroxime Anaphylaxis and Other (See Comments)    Throat swelling - MAR- Dtr cannot recall this reaction, just rash    Fish Allergy Shortness Of Breath    Pt said she has throat swelling    Ciprofloxacin Rash    Per MAR   Codeine Other (See Comments)    Jittery - Per MAR   Sertraline Hcl Rash    Per MAR   Cephalexin Rash    Per MAR   Paxil [Paroxetine Hcl] Other (See Comments)    Delirium while in hospital reports of hallucinations.    Antimicrobials this admission: Vancomycin 8/10 >>  Aztreonam 8/10 >>   Dose adjustments this admission: None.  Microbiology results: 8/10 10/10    Thank you for allowing pharmacy to be a part of this patient's care.  RAQ:[id number], PharmD PGY1 Pharmacy Resident  Please check AMION for all [facility] pharmacy phone numbers After 10:00 PM call main pharmacy [phone]

## 2020-11-18 NOTE — H&P (Addendum)
History and Physical   [name] ZOX:[id number] DOB: 11/25/1929 DOA: 11/18/2020  PCP: [name], MD   Patient coming from: [facility]  Chief Complaint: Cough, hypoxia, altered mental status  HPI: [name] is a 87 y.o. female with medical history significant of dementia, hypothyroidism, CKD 3B, hypertension, hyperlipidemia, diabetes, A. fib, CAD status post CABG, CHF (diastolic), history of DVT, rheumatoid arthritis, recurrent UTI, CVA who presents with some increased confusion, hypoxia, cough. History obtained assistance of chart review and family due to patient's dementia.  As above patient presented from [facility] with increased confusion, hypoxia at facility and ongoing cough.  Also has history of recurrent UTIs and received a single dose of fosfomycin on 8/2.  At facility she reportedly had a saturation around 70%.  Was 85% for EMS and improved with 2 L in route.  ED Course: Vital signs in the ED significant for respirate in the 20s to 30s.  Saturating well on 0 to 2 L.  Lab work-up showed CMP with BUN of 25 and creatinine 1.27 which is stable for her, Glucose 132, Protein 8.3 and albumin 3.2.  CBC within normal limits.  Troponin flat with initial mild elevation at 23 and then 18 on repeat.  BNP mildly elevated to 400.  Respiratory no for flu and COVID-negative.  Urinalysis showing leukocytes, white cells, bacteria.  Chest x-ray with no acute abnormality.  CTA PE study showed no PE, cardiomegaly, trace pulmonary edema and age-indeterminate T6 compression fracture with recommendation of MRI for follow-up.  Antibiotics per pharmacy for her UTI ordered in ED due to various allergies and recent treatment.  Review of Systems: Unable to be reliably performed due to patient's dementia.  Past Medical History:  Diagnosis Date   Acute pancreatitis    Anemia    Anxiety and depression    Arthritis    RA   Atrial fibrillation (HCC)    CAD (coronary artery disease)    Carotid  artery occlusion    Cerebral infarction (HCC)    CHF (congestive heart failure) (HCC)    CKD (chronic kidney disease)    Dementia (HCC)    Diabetes mellitus age 87   DJD (degenerative joint disease)    DVT (deep venous thrombosis) (HCC)    GERD (gastroesophageal reflux disease)    GI bleed    Hyperlipidemia    Hypertension    Hypothyroid    Joint pain    Mesenteric ischemia    Muscle weakness (generalized)    PVD (peripheral vascular disease) (HCC)    Renal disorder    Stage IV   Rheumatoid arthritis (HCC)    Thyroid disease    Vascular dementia (HCC)     Past Surgical History:  Procedure Laterality Date   APPENDECTOMY     CELIAC ARTERY STENT     CORONARY ARTERY BYPASS GRAFT     EMBOLECTOMY  2009   right leg   FASCIOTOMY     right leg   HIP FRACTURE SURGERY Left    JOINT REPLACEMENT     TOTAL KNEE ARTHROPLASTY     bilateral   TUBAL LIGATION     VISCERAL ANGIOGRAM N/A 05/31/2013   Procedure: MESSENTERIC [name];  Surgeon: [name], MD;  Location: [facility] CATH LAB;  Service: Cardiovascular;  Laterality: N/A;    Social History  reports that she has never smoked. She has never used smokeless tobacco. She reports that she does not drink alcohol and does not use drugs.  Allergies  Allergen  Reactions   Cefuroxime Anaphylaxis and Other (See Comments)    Throat swelling - MAR- Dtr cannot recall this reaction, just rash    Fish Allergy Shortness Of Breath    Pt said she has throat swelling    Ciprofloxacin Rash    Per MAR   Codeine Other (See Comments)    Jittery - Per MAR   Sertraline Hcl Rash    Per MAR   Cephalexin Rash    Per MAR   Paxil [Paroxetine Hcl] Other (See Comments)    Delirium while in hospital reports of hallucinations.    Family History  Problem Relation Age of Onset   Cancer Mother        ovarian   Other Father        suicide   Coronary artery disease Sister    Other Sister        alzheimers   Heart disease Sister        Before age  4   Diabetes Sister    Cancer Daughter        spinal tumor   Diabetes Daughter    Hypertension Daughter    Diabetes Son    Hyperlipidemia Son    Hypertension Son   Reviewed on admission  Prior to Admission medications   Medication Sig Start Date End Date Taking? Authorizing Provider  bacitracin 500 UNIT/GM ointment Apply topically. 07/31/20   [provider]  Calcium Carb-Cholecalciferol 600-800 MG-UNIT TABS TAKE ONE TABLET BY MOUTH 2 TIMES A DAY Patient taking differently: Take 1 tablet by mouth 2 (two) times daily. 09/11/15   Rodolph Bong, MD  Cranberry 500 MG CAPS Take 500 mg by mouth daily.     [provider]  dabigatran (PRADAXA) 75 MG CAPS capsule Take 1 capsule (75 mg total) by mouth every 12 (twelve) hours. 05/15/20   Dorcas Carrow, MD  diltiazem (CARDIZEM CD) 180 MG 24 hr capsule Take 1 capsule (180 mg total) by mouth at bedtime. 05/24/17   Rodolph Bong, MD  docusate sodium (COLACE) 100 MG capsule Take 100 mg by mouth 2 (two) times daily.    [provider]  doxycycline (VIBRA-TABS) 100 MG tablet Take 1 tablet (100 mg total) by mouth every 12 (twelve) hours. Patient not taking: Reported on 08/17/2020 07/31/20   Joseph Art, DO  Ergocalciferol (VITAMIN D2) 2000 units TABS Take 2,000 Units by mouth daily.     [provider]  ferrous sulfate 325 (65 FE) MG tablet Take 325 mg by mouth 2 (two) times daily.     [provider]  FLUoxetine (PROZAC) 10 MG capsule Take 10 mg by mouth daily.    [provider]  folic acid (FOLVITE) 1 MG tablet Take 1 tablet (1 mg total) by mouth daily. 05/24/17   Rodolph Bong, MD  glucose blood test strip Once daily E11.29 03/03/15   Rodolph Bong, MD  guaiFENesin 200 MG tablet Take 200 mg by mouth every 4 (four) hours as needed.    [provider]  HEMORRHOIDAL 0.25-14-74.9 % rectal ointment Place 1 application rectally 2 (two) times daily as needed. 05/25/20   [provider]   Infant Care Products [facility]) OINT Apply 1 application topically 2 (two) times daily.    [provider]  levETIRAcetam (KEPPRA) 250 MG tablet Take 1 tablet (250 mg total) by mouth at bedtime. 08/27/20   Penumalli, Glenford Bayley, MD  levothyroxine (SYNTHROID) 75 MCG tablet Take 75 mcg by  mouth daily before breakfast.    [provider]  loperamide (IMODIUM A-D) 2 MG tablet Take 4 mg by mouth See admin instructions. Take 2 capsules (  totally) by mouth after first loose stool and  after subsequent stool; do not exceed  in 24hr    [provider]  memantine (NAMENDA) 10 MG tablet Take 10 mg by mouth at bedtime.    [provider]  methenamine (MANDELAMINE) 0.5 GM tablet Take 1 tablet (500 mg total) by mouth 2 (two) times daily. 07/31/20   Joseph Art, DO  methotrexate (RHEUMATREX) 10 MG tablet Take 20 mg by mouth every Friday. Patient not taking: Reported on 08/17/2020    [provider]  metoprolol succinate (TOPROL-XL) 25 MG 24 hr tablet Take 25 mg by mouth daily.    [provider]  mirtazapine (REMERON) 15 MG tablet Take 7.5 mg by mouth at bedtime.    [provider]  polyethylene glycol (MIRALAX / GLYCOLAX) 17 g packet Take 17 g by mouth daily.    [provider]  pravastatin (PRAVACHOL) 40 MG tablet TAKE 1 TABLET (40 MG TOTAL) BY MOUTH DAILY. Patient taking differently: Take 40 mg by mouth at bedtime. 01/03/18   Rodolph Bong, MD  PREMARIN vaginal cream PLACE 1 APPLICATORFUL VAGINALLY ONCE A DAY Patient taking differently: Place 0.5 g vaginally daily. 12/08/17   Rodolph Bong, MD  Propylene Glycol (SYSTANE COMPLETE) 0.6 % SOLN Apply 1 drop to eye 4 (four) times daily.    [provider]  Saccharomyces boulardii (PROBIOTIC) 250 MG CAPS Take 250 mg by mouth 2 (two) times daily. 04/06/20   [provider]  Skin Protectants, Misc. (EUCERIN) cream Apply 1 application topically as needed for dry skin.     [provider]  torsemide (DEMADEX) 100 MG tablet Take 0.5 tablets (50 mg total) by mouth daily. Patient not taking: Reported on 08/17/2020 07/31/20   Joseph Art, DO  vitamin B-12 (CYANOCOBALAMIN) 1000 MCG tablet Take 1,000 mcg by mouth daily.    [provider]  vitamin C (ASCORBIC ACID) 500 MG tablet Take 500 mg by mouth daily.    [provider]    Physical Exam: Vitals:   11/18/20 1745 11/18/20 1800 11/18/20 1830 11/18/20 2000  BP: 139/69 131/79 131/77 136/84  Pulse: 86 76 85 96  Resp: (!) 29 (!) 24 (!) 26 (!) 23  Temp:      TempSrc:      SpO2: 93% 93% 94% 96%  Weight:    65.8 kg  Height:     (1.575 m)   Physical Exam Constitutional:      General: She is not in acute distress.    Appearance: Normal appearance.     Comments: Tired appearing.  Oriented to person and hospital but not specific place.  HENT:     Head: Normocephalic and atraumatic.     Mouth/Throat:     Mouth: Mucous membranes are moist.     Pharynx: Oropharynx is clear.  Eyes:     Extraocular Movements: Extraocular movements intact.     Pupils: Pupils are equal, round, and reactive to light.  Cardiovascular:     Rate and Rhythm: Normal rate and regular rhythm.     Pulses: Normal pulses.     Heart sounds: Normal heart sounds.  Pulmonary:     Effort: Pulmonary effort is normal. No respiratory distress.     Breath sounds: Rales present.  Abdominal:  General: Bowel sounds are normal. There is no distension.     Palpations: Abdomen is soft.     Tenderness: There is no abdominal tenderness.  Musculoskeletal:        General: No swelling or deformity.  Skin:    General: Skin is warm and dry.  Neurological:     General: No focal deficit present.     Mental Status: Mental status is at baseline.     Comments: Tired appearing.  Oriented to person and hospital but not specific place.   Labs on Admission: I have personally reviewed following labs and imaging  studies  CBC: Recent Labs  Lab 11/18/20 1250  WBC 9.6  NEUTROABS 5.4  HGB 13.6  HCT 43.7  MCV 100.2*  PLT 165    Basic Metabolic Panel: Recent Labs  Lab 11/18/20 1250  NA 138  K 4.3  CL 101  CO2 27  GLUCOSE 132*  BUN 25*  CREATININE 1.27*  CALCIUM 10.1    GFR: Estimated Creatinine Clearance: 27.3 mL/min (A) (by C-G formula based on SCr of 1.27 mg/dL (H)).  Liver Function Tests: Recent Labs  Lab 11/18/20 1250  AST 17  ALT 11  ALKPHOS 68  BILITOT 0.8  PROT 8.3*  ALBUMIN 3.2*    Urine analysis:    Component Value Date/Time   COLORURINE STRAW (A) 11/18/2020 1701   APPEARANCEUR CLEAR 11/18/2020 1701   LABSPEC 1.012 11/18/2020 1701   PHURINE 5.0 11/18/2020 1701   GLUCOSEU NEGATIVE 11/18/2020 1701   GLUCOSEU NEG mg/dL 62/70/3500 9381   HGBUR NEGATIVE 11/18/2020 1701   BILIRUBINUR NEGATIVE 11/18/2020 1701   BILIRUBINUR NEGATIUVE 01/15/2018 1428   KETONESUR NEGATIVE 11/18/2020 1701   PROTEINUR NEGATIVE 11/18/2020 1701   UROBILINOGEN 1.0 01/15/2018 1428   UROBILINOGEN 0.2 03/07/2010 1517   NITRITE NEGATIVE 11/18/2020 1701   LEUKOCYTESUR LARGE (A) 11/18/2020 1701    Radiological Exams on Admission: CT Angio Chest PE W/Cm &/Or Wo Cm  Result Date: 11/18/2020 CLINICAL DATA:  87 year old female with cough, shortness of breath, hypoxia, concern for pulmonary embolism. EXAM: CT ANGIOGRAPHY CHEST WITH CONTRAST TECHNIQUE: Multidetector CT imaging of the chest was performed using the standard protocol during bolus administration of intravenous contrast. Multiplanar CT image reconstructions and MIPs were obtained to evaluate the vascular anatomy. CONTRAST:  Sixty mL Omnipaque 350, intravenous COMPARISON:  04/29/2008 FINDINGS: Cardiovascular: Satisfactory opacification of the pulmonary arteries to the segmental level. No evidence of pulmonary embolism. Severe biatrial cardiomegaly. Severe mitral annular calcifications. Aortic valvular calcifications are present. Severe  native coronary atherosclerotic calcifications. Postsurgical changes after coronary artery bypass graft. Scattered atherosclerotic calcifications of the normal caliber thoracic aorta. No pericardial effusion. Mediastinum/Nodes: No enlarged mediastinal, hilar, or axillary lymph nodes. Thyroid gland, trachea, and esophagus demonstrate no significant findings. Lungs/Pleura: Minimal basal and dependent interlobular septal thickening. No overt pulmonary edema. No pleural effusions or pneumothorax. No focal consolidations. No suspicious pulmonary nodules. Upper Abdomen: The visualized upper abdomen is within normal limits. Musculoskeletal: Sigmoid curvature of the thoracic spine. Age-indeterminate anterior wedge compression deformity of the T6 vertebral body. No retropulsion. Status post median sternotomy. Review of the MIP images confirms the above findings. IMPRESSION: Vascular: 1. No evidence of pulmonary embolism. 2. Severe biatrial cardiomegaly. 3. Coronary and aortic atherosclerosis (ICD10-I70.0). Non-Vascular: 1. Trace pulmonary edema, likely cardiogenic. 2. Age-indeterminate anterior wedge compression deformity of the T6 vertebral body, no retropulsion. If there is clinical concern for acuity, recommend thoracic spine MRI further evaluation. Marliss Coots, MD [facility]  Radiology Electronically Signed   By: Marliss Coots MD   On: 11/18/2020 16:26   DG Chest Port 1 View  Result Date: 11/18/2020 CLINICAL DATA:  Cough and hypoxia. EXAM: PORTABLE CHEST 1 VIEW COMPARISON:  05/19/2019 FINDINGS: Previous median sternotomy and CABG procedure. Stable cardiomediastinal contours. No signs of pleural effusion or edema. No airspace consolidation. The visualized osseous structures are unremarkable. IMPRESSION: No acute cardiopulmonary abnormalities. Electronically Signed   By: Signa Kell M.D.   On: 11/18/2020 14:42    EKG: Independently reviewed.  Atrial fibrillation at  104 bpm.  PVCs noted.  Evidence of LVH.  Similar to previous with PVCs being new and faster rate from previous.  Assessment/Plan Principal Problem:   Acute metabolic encephalopathy Active Problems:   Hypothyroidism   ATRIAL FIBRILLATION   Chronic diastolic heart failure (HCC)   CEREBROVASCULAR DISEASE   PERIPHERAL VASCULAR DISEASE   DNR (do not resuscitate)   Rheumatoid arthritis (HCC)   Diabetes mellitus with renal complications (HCC)   Frequent UTI   CKD (chronic kidney disease) stage 3, GFR 30-59 ml/min (HCC)   CAD (coronary artery disease)   Hyperlipidemia   Hypertension   Vascular dementia, uncomplicated (HCC)   AMS UTI Recurrent UTI > Metabolic encephalopathy suspected early due to UTI. > Has history of recurrent UTI had previously been on methenamine. > Recently treated for UTI about a week ago with fosfomycin.  Has presented similarly with UTI in the past.  Does have leukocytes, white cells and bacteria in urine. > Pharmacy consulted for antibiotics in the ED due to patient's recent antibiotics as well as allergies. > No fever or leukocytosis in the ED.  Urine studies ordered. -Continue with antibiotics per pharmacy -Trend fever curve and white count -Follow-up urine cultures  Hypoxia > Initially required 2 L in the ED.  However she has been weaned off this in the ED without significant intervention.  We will continue to monitor and consider diuresis if recurs as below.  CHF (diastolic) > History of diastolic heart failure.  Last Echo in our system on February of this year with EF 55-60%, hypertrophy with indeterminate diastolic parameters as well as normal RV function > Mildly elevated BNP to 400.  No significant edema. Hypoxia as above has resolved in the ED. rales noted on exam. - Low dose IV lasix x1 - Continue Torsemide  Dementia > Continue home Namenda  Imaging abnormality > CT scan showed signs of age-indeterminate compression fracture.  Recommending MRI  for follow-up.  Hypothyroidism - Continue home Synthroid  GERD -Continue PPI  CKD 3B > Creatinine stable in the ED. - Avoid nephrotoxic agents - Trend renal function and electrolytes  Hypertension > Blood pressure stable in the ED - Continue home diltiazem, metoprolol  Diabetes - SSI  A. Fib > Continue home diltiazem, metoprolol, Pradaxa  CAD status post CABG - Continue home metoprolol, statin  History of DVT > On chronic Pradaxa as above.  Rheumatoid arthritis  - Had previously been on methotrexate however is not currently taking this.  HLD PAD Hx of CVA  Seizure disorder as a result of stroke > History of SMA and other stenting - Continue home statin - Continue home Keppra   DVT prophylaxis: Pradaxa  Code Status:   DNR  Family Communication:  Daughter updated by phone Disposition Plan:   Patient is from:  [facility]  Anticipated DC to:  [facility]  Anticipated DC date:  1 to 3 days  Anticipated DC barriers: None  Consults  called:  None  Admission status:  Observation, telemetry   Severity of Illness: The appropriate patient status for this patient is OBSERVATION. Observation status is judged to be reasonable and necessary in order to provide the required intensity of service to ensure the patient's safety. The patient's presenting symptoms, physical exam findings, and initial radiographic and laboratory data in the context of their medical condition is felt to place them at decreased risk for further clinical deterioration. Furthermore, it is anticipated that the patient will be medically stable for discharge from the hospital within 2 midnights of admission. The following factors support the patient status of observation.   " The patient's presenting symptoms include confusion, cough, hypoxia. " The physical exam findings include confusion, rales. " The initial radiographic and laboratory data are Lab work-up showed CMP with BUN of 25 and creatinine  1.27 which is stable for her, Glucose 132, Protein 8.3 and albumin 3.2.  CBC within normal limits.  Troponin flat with initial mild elevation at 23 and then 18 on repeat.  BNP mildly elevated to 400.  Respiratory no for flu and COVID-negative.  Urinalysis showing leukocytes, white cells, bacteria.  Chest x-ray with no acute abnormality.  CTA PE study showed no PE, cardiomegaly, trace pulmonary edema and age-indeterminate T6 compression fracture with recommendation of MRI for follow-up.   Synetta Fail MD [facility]  How to contact the [facility] Attending or Consulting provider 7A - 7P or covering provider during after hours 7P -7A, for this patient?   Check the care team in [facility] and look for a) attending/consulting [facility] provider listed and b) the [facility] team listed Log into [email or web address] and use [place]'s universal password to access. If you do not have the password, please contact the hospital operator. Locate the [facility] provider you are looking for under [facility] and page to a number that you can be directly reached. If you still have difficulty reaching the provider, please page the [facility] (Director on Call) for the Hospitalists listed on amion for assistance.  11/18/2020, 10:00 PM

## 2020-11-18 NOTE — ED Triage Notes (Signed)
Pt BIB [facility] from [facility]. Called out for hypoxia, 85% on room air. Pt was 70% per facility. Pt A/O x2, alert to self and place, baseline is A/O x4. EKG shows afib with frequent PVCs. CBG 166. BP 155/86. Pt states she doesn't feel well. Pt tachypneic, rales auscultated upper and Lower on R side, lower on L side. Per facility, recent CXR unremarkable. Pt on abx for prophylaxis for UTI.

## 2020-11-18 NOTE — ED Provider Notes (Signed)
  Physical Exam  BP 136/84   Pulse 96   Temp (!) 97.4 F (36.3 C) (Oral)   Resp (!) 23   SpO2 96%   Physical Exam  ED Course/Procedures   Clinical Course as of 11/18/20 2022  Wed Nov 18, 2020  1443 CBC is normal.  [CS]  1454 CXR is clear, may need CTA pending Creatinine.  [CS]  1516 Care of the patient signed out to Dr. Rubin Payor at the change of shift.  [CS]    Clinical Course User Index [CS] Pollyann Savoy, MD    Procedures  MDM  Received patient in signout.  Sent in for mental status change and hypoxia.  Reportedly had sats in the 70s at the nursing home.  Improved somewhat here.  Now is actually off the oxygen.  CT scan of the chest shows potential volume overload/CHF.  BNP is elevated.  However does not have much peripheral edema.  Has had recent BNP of the same level when she admitted to the hospital.  However also has likely UTI.  Has recurrent UTIs.  Few days ago got fosfomycin for the same.  Patient's daughter states that it was resistant to Bactrim.  With worsening mental status and potential UTI think it is worth treating.  Consulted with pharmacy to help with antibiotic choice since she has had some resistant organisms and has allergies and recent antibiotic treatments.  Will admit to hospitalist.       Benjiman Core, MD 11/18/20 2022

## 2020-11-18 NOTE — ED Notes (Signed)
Pt SpO2 93% on room air. Placed on 2L via [place].

## 2020-11-18 NOTE — ED Provider Notes (Signed)
[facility] Provider Note  CSN: [id number] Arrival date & time: 11/18/20 1231    History Chief Complaint  Patient presents with   Altered Mental Status    [name] is a 87 y.o. female with history of multiple medical problems brought to the ED via EMS from [facility], daughter at bedside provides the history. She was recently treated for UTI with single dose fosfomycin (8/2). She has also been having a coarse productive cough the last several days, no documented fever but at the SNF today she was reported hypoxic with room air SpO2 70%. She does not normally wear any oxygen. EMS reports she was 85% on their arrival, started on supplemental oxygen with improvement. She has been more confused lately per EMS.    Past Medical History:  Diagnosis Date   Acute pancreatitis    Anemia    Anxiety and depression    Arthritis    RA   Atrial fibrillation (HCC)    CAD (coronary artery disease)    Carotid artery occlusion    Cerebral infarction (HCC)    CHF (congestive heart failure) (HCC)    CKD (chronic kidney disease)    Dementia (HCC)    Diabetes mellitus age 31   DJD (degenerative joint disease)    DVT (deep venous thrombosis) (HCC)    GERD (gastroesophageal reflux disease)    GI bleed    Hyperlipidemia    Hypertension    Hypothyroid    Joint pain    Mesenteric ischemia    Muscle weakness (generalized)    PVD (peripheral vascular disease) (HCC)    Renal disorder    Stage IV   Rheumatoid arthritis (HCC)    Thyroid disease    Vascular dementia (HCC)     Past Surgical History:  Procedure Laterality Date   APPENDECTOMY     CELIAC ARTERY STENT     CORONARY ARTERY BYPASS GRAFT     EMBOLECTOMY  2009   right leg   FASCIOTOMY     right leg   HIP FRACTURE SURGERY Left    JOINT REPLACEMENT     TOTAL KNEE ARTHROPLASTY     bilateral   TUBAL LIGATION     VISCERAL ANGIOGRAM N/A 05/31/2013   Procedure: MESSENTERIC [name];  Surgeon: [name], MD;  Location: [facility] CATH LAB;  Service: Cardiovascular;  Laterality: N/A;    Family History  Problem Relation Age of Onset   Cancer Mother        ovarian   Other Father        suicide   Coronary artery disease Sister    Other Sister        alzheimers   Heart disease Sister        Before age 8   Diabetes Sister    Cancer Daughter        spinal tumor   Diabetes Daughter    Hypertension Daughter    Diabetes Son    Hyperlipidemia Son    Hypertension Son     Social History   Tobacco Use   Smoking status: Never   Smokeless tobacco: Never  Vaping Use   Vaping Use: Never used  Substance Use Topics   Alcohol use: No   Drug use: No     Home Medications Prior to Admission medications   Medication Sig Start Date End Date Taking? Authorizing Provider  bacitracin 500 UNIT/GM ointment Apply topically. 07/31/20   [provider]  Calcium Carb-Cholecalciferol  600-800 MG-UNIT TABS TAKE ONE TABLET BY MOUTH 2 TIMES A DAY Patient taking differently: Take 1 tablet by mouth 2 (two) times daily. 09/11/15   Rodolph Bong, MD  Cranberry 500 MG CAPS Take 500 mg by mouth daily.     [provider]  dabigatran (PRADAXA) 75 MG CAPS capsule Take 1 capsule (75 mg total) by mouth every 12 (twelve) hours. 05/15/20   Dorcas Carrow, MD  diltiazem (CARDIZEM CD) 180 MG 24 hr capsule Take 1 capsule (180 mg total) by mouth at bedtime. 05/24/17   Rodolph Bong, MD  docusate sodium (COLACE) 100 MG capsule Take 100 mg by mouth 2 (two) times daily.    [provider]  doxycycline (VIBRA-TABS) 100 MG tablet Take 1 tablet (100 mg total) by mouth every 12 (twelve) hours. Patient not taking: Reported on 08/17/2020 07/31/20   Joseph Art, DO  Ergocalciferol (VITAMIN D2) 2000 units TABS Take 2,000 Units by mouth daily.     [provider]  ferrous sulfate 325 (65 FE) MG tablet Take 325 mg by mouth 2 (two) times daily.     [provider]  FLUoxetine (PROZAC) 10 MG  capsule Take 10 mg by mouth daily.    [provider]  folic acid (FOLVITE) 1 MG tablet Take 1 tablet (1 mg total) by mouth daily. 05/24/17   Rodolph Bong, MD  glucose blood test strip Once daily E11.29 03/03/15   Rodolph Bong, MD  guaiFENesin 200 MG tablet Take 200 mg by mouth every 4 (four) hours as needed.    [provider]  HEMORRHOIDAL 0.25-14-74.9 % rectal ointment Place 1 application rectally 2 (two) times daily as needed. 05/25/20   [provider]  [employer] [facility]) OINT Apply 1 application topically 2 (two) times daily.    [provider]  levETIRAcetam (KEPPRA) 250 MG tablet Take 1 tablet (250 mg total) by mouth at bedtime. 08/27/20   Penumalli, Glenford Bayley, MD  levothyroxine (SYNTHROID) 75 MCG tablet Take 75 mcg by mouth daily before breakfast.    [provider]  loperamide (IMODIUM A-D) 2 MG tablet Take 4 mg by mouth See admin instructions. Take 2 capsules (4mg  totally) by mouth after first loose stool and 2mg  after subsequent stool; do not exceed 16mg  in 24hr    [provider]  memantine (NAMENDA) 10 MG tablet Take 10 mg by mouth at bedtime.    [provider]  methenamine (MANDELAMINE) 0.5 GM tablet Take 1 tablet (500 mg total) by mouth 2 (two) times daily. 07/31/20   Joseph Art, DO  methotrexate (RHEUMATREX) 10 MG tablet Take 20 mg by mouth every Friday. Patient not taking: Reported on 08/17/2020    [provider]  metoprolol succinate (TOPROL-XL) 25 MG 24 hr tablet Take 25 mg by mouth daily.    [provider]  mirtazapine (REMERON) 15 MG tablet Take 7.5 mg by mouth at bedtime.    [provider]  polyethylene glycol (MIRALAX / GLYCOLAX) 17 g packet Take 17 g by mouth daily.    [provider]  pravastatin (PRAVACHOL) 40 MG tablet TAKE 1 TABLET (40 MG TOTAL) BY MOUTH DAILY. Patient taking differently: Take 40 mg by mouth at bedtime. 01/03/18   Rodolph Bong, MD   PREMARIN vaginal cream PLACE 1 APPLICATORFUL VAGINALLY ONCE A DAY Patient taking differently: Place 0.5 g vaginally daily. 12/08/17   Rodolph Bong, MD  Propylene Glycol (SYSTANE COMPLETE) 0.6 %  SOLN Apply 1 drop to eye 4 (four) times daily.    [provider]  Saccharomyces boulardii (PROBIOTIC) 250 MG CAPS Take 250 mg by mouth 2 (two) times daily. 04/06/20   [provider]  Skin Protectants, Misc. (EUCERIN) cream Apply 1 application topically as needed for dry skin.    [provider]  torsemide (DEMADEX) 100 MG tablet Take 0.5 tablets (50 mg total) by mouth daily. Patient not taking: Reported on 08/17/2020 07/31/20   Joseph Art, DO  vitamin B-12 (CYANOCOBALAMIN) 1000 MCG tablet Take 1,000 mcg by mouth daily.    [provider]  vitamin C (ASCORBIC ACID) 500 MG tablet Take 500 mg by mouth daily.    [provider]     Allergies    Cefuroxime, Fish allergy, Ciprofloxacin, Codeine, Sertraline hcl, Cephalexin, and Paxil [paroxetine hcl]   Review of Systems   Review of Systems Unable to assess due to mental status.    Physical Exam BP (!) 124/108   Pulse 68   Temp (!) 97.4 F (36.3 C) (Oral)   Resp (!) 28   SpO2 100%   Physical Exam Vitals and nursing note reviewed.  Constitutional:      Appearance: Normal appearance.  HENT:     Head: Normocephalic and atraumatic.     Nose: Nose normal.     Mouth/Throat:     Mouth: Mucous membranes are moist.  Eyes:     Extraocular Movements: Extraocular movements intact.     Conjunctiva/sclera: Conjunctivae normal.  Cardiovascular:     Rate and Rhythm: Tachycardia present. Rhythm irregular.  Pulmonary:     Effort: Pulmonary effort is normal.     Breath sounds: Rhonchi and rales present.  Abdominal:     General: Abdomen is flat.     Palpations: Abdomen is soft.     Tenderness: There is no abdominal tenderness.  Musculoskeletal:        General: No swelling. Normal range of motion.      Cervical back: Neck supple.  Skin:    General: Skin is warm and dry.  Neurological:     General: No focal deficit present.     Mental Status: She is alert. She is disoriented.  Psychiatric:        Mood and Affect: Mood normal.     ED Results / Procedures / Treatments   Labs (all labs ordered are listed, but only abnormal results are displayed) Labs Reviewed  RESP PANEL BY RT-PCR (FLU A&B, COVID) [id number]  COMPREHENSIVE METABOLIC PANEL  CBC WITH DIFFERENTIAL/PLATELET  URINALYSIS, ROUTINE W REFLEX MICROSCOPIC  BRAIN NATRIURETIC PEPTIDE  TROPONIN I (HIGH SENSITIVITY)    EKG None  Radiology No results found.  Procedures Procedures  Medications Ordered in the ED Medications - No data to display   MDM Rules/Calculators/A&P MDM Patient with SOB, hypoxia and confusion. SpO2 99% on 1L [place] at the time of my evaluation, will continue to wean down. Check CXR, labs, Covid. EKG show afib.   ED Course  I have reviewed the triage vital signs and the nursing notes.  Pertinent labs & imaging results that were available during my care of the patient were reviewed by me and considered in my medical decision making (see chart for details).  Clinical Course as of 11/19/20 0959  Wed Nov 18, 2020  1443 CBC is normal.  [CS]  1454 CXR is clear, may need CTA pending Creatinine.  [CS]  1516 Care of the patient signed out to Dr.  Pickering at the change of shift.  [CS]    Clinical Course User Index [CS] Pollyann Savoy, MD    Final Clinical Impression(s) / ED Diagnoses Final diagnoses:  None    Rx / DC Orders ED Discharge Orders     None        Pollyann Savoy, MD 11/19/20 [phone]

## 2020-11-19 ENCOUNTER — Encounter (HOSPITAL_COMMUNITY): Payer: Self-pay | Admitting: Internal Medicine

## 2020-11-19 DIAGNOSIS — E785 Hyperlipidemia, unspecified: Secondary | ICD-10-CM | POA: Diagnosis present

## 2020-11-19 DIAGNOSIS — I130 Hypertensive heart and chronic kidney disease with heart failure and stage 1 through stage 4 chronic kidney disease, or unspecified chronic kidney disease: Secondary | ICD-10-CM | POA: Diagnosis present

## 2020-11-19 DIAGNOSIS — I679 Cerebrovascular disease, unspecified: Secondary | ICD-10-CM | POA: Diagnosis not present

## 2020-11-19 DIAGNOSIS — F0390 Unspecified dementia without behavioral disturbance: Secondary | ICD-10-CM | POA: Diagnosis not present

## 2020-11-19 DIAGNOSIS — Z20822 Contact with and (suspected) exposure to covid-19: Secondary | ICD-10-CM | POA: Diagnosis present

## 2020-11-19 DIAGNOSIS — N390 Urinary tract infection, site not specified: Secondary | ICD-10-CM | POA: Diagnosis present

## 2020-11-19 DIAGNOSIS — I714 Abdominal aortic aneurysm, without rupture: Secondary | ICD-10-CM | POA: Diagnosis present

## 2020-11-19 DIAGNOSIS — M4854XA Collapsed vertebra, not elsewhere classified, thoracic region, initial encounter for fracture: Secondary | ICD-10-CM | POA: Diagnosis present

## 2020-11-19 DIAGNOSIS — J9601 Acute respiratory failure with hypoxia: Secondary | ICD-10-CM | POA: Diagnosis present

## 2020-11-19 DIAGNOSIS — I719 Aortic aneurysm of unspecified site, without rupture: Secondary | ICD-10-CM | POA: Diagnosis not present

## 2020-11-19 DIAGNOSIS — G40909 Epilepsy, unspecified, not intractable, without status epilepticus: Secondary | ICD-10-CM | POA: Diagnosis present

## 2020-11-19 DIAGNOSIS — N1832 Chronic kidney disease, stage 3b: Secondary | ICD-10-CM | POA: Diagnosis present

## 2020-11-19 DIAGNOSIS — I2510 Atherosclerotic heart disease of native coronary artery without angina pectoris: Secondary | ICD-10-CM | POA: Diagnosis present

## 2020-11-19 DIAGNOSIS — N3000 Acute cystitis without hematuria: Secondary | ICD-10-CM | POA: Diagnosis present

## 2020-11-19 DIAGNOSIS — Z6826 Body mass index (BMI) 26.0-26.9, adult: Secondary | ICD-10-CM | POA: Diagnosis not present

## 2020-11-19 DIAGNOSIS — E039 Hypothyroidism, unspecified: Secondary | ICD-10-CM | POA: Diagnosis present

## 2020-11-19 DIAGNOSIS — R109 Unspecified abdominal pain: Secondary | ICD-10-CM | POA: Diagnosis not present

## 2020-11-19 DIAGNOSIS — E1151 Type 2 diabetes mellitus with diabetic peripheral angiopathy without gangrene: Secondary | ICD-10-CM | POA: Diagnosis present

## 2020-11-19 DIAGNOSIS — G9341 Metabolic encephalopathy: Secondary | ICD-10-CM | POA: Diagnosis present

## 2020-11-19 DIAGNOSIS — J189 Pneumonia, unspecified organism: Secondary | ICD-10-CM | POA: Diagnosis present

## 2020-11-19 DIAGNOSIS — Z66 Do not resuscitate: Secondary | ICD-10-CM | POA: Diagnosis present

## 2020-11-19 DIAGNOSIS — I69998 Other sequelae following unspecified cerebrovascular disease: Secondary | ICD-10-CM | POA: Diagnosis not present

## 2020-11-19 DIAGNOSIS — E663 Overweight: Secondary | ICD-10-CM | POA: Diagnosis present

## 2020-11-19 DIAGNOSIS — J209 Acute bronchitis, unspecified: Secondary | ICD-10-CM | POA: Diagnosis present

## 2020-11-19 DIAGNOSIS — Z95828 Presence of other vascular implants and grafts: Secondary | ICD-10-CM | POA: Diagnosis not present

## 2020-11-19 DIAGNOSIS — I480 Paroxysmal atrial fibrillation: Secondary | ICD-10-CM | POA: Diagnosis present

## 2020-11-19 DIAGNOSIS — M069 Rheumatoid arthritis, unspecified: Secondary | ICD-10-CM | POA: Diagnosis present

## 2020-11-19 DIAGNOSIS — I5033 Acute on chronic diastolic (congestive) heart failure: Secondary | ICD-10-CM | POA: Diagnosis present

## 2020-11-19 DIAGNOSIS — E1122 Type 2 diabetes mellitus with diabetic chronic kidney disease: Secondary | ICD-10-CM | POA: Diagnosis present

## 2020-11-19 DIAGNOSIS — F0150 Vascular dementia without behavioral disturbance: Secondary | ICD-10-CM | POA: Diagnosis present

## 2020-11-19 DIAGNOSIS — I10 Essential (primary) hypertension: Secondary | ICD-10-CM

## 2020-11-19 LAB — CBC
HCT: 42.1 % (ref 36.0–46.0)
Hemoglobin: 13.5 g/dL (ref 12.0–15.0)
MCH: 31.8 pg (ref 26.0–34.0)
MCHC: 32.1 g/dL (ref 30.0–36.0)
MCV: 99.1 fL (ref 80.0–100.0)
Platelets: 146 10*3/uL — ABNORMAL LOW (ref 150–400)
RBC: 4.25 MIL/uL (ref 3.87–5.11)
RDW: 14.6 % (ref 11.5–15.5)
WBC: 8.8 10*3/uL (ref 4.0–10.5)
nRBC: 0 % (ref 0.0–0.2)

## 2020-11-19 LAB — COMPREHENSIVE METABOLIC PANEL
ALT: 10 U/L (ref 0–44)
AST: 16 U/L (ref 15–41)
Albumin: 2.9 g/dL — ABNORMAL LOW (ref 3.5–5.0)
Alkaline Phosphatase: 61 U/L (ref 38–126)
Anion gap: 10 (ref 5–15)
BUN: 27 mg/dL — ABNORMAL HIGH (ref 8–23)
CO2: 28 mmol/L (ref 22–32)
Calcium: 9.6 mg/dL (ref 8.9–10.3)
Chloride: 102 mmol/L (ref 98–111)
Creatinine, Ser: 1.3 mg/dL — ABNORMAL HIGH (ref 0.44–1.00)
GFR, Estimated: 40 mL/min — ABNORMAL LOW (ref >60)
Glucose, Bld: 123 mg/dL — ABNORMAL HIGH (ref 70–99)
Potassium: 4 mmol/L (ref 3.5–5.1)
Sodium: 140 mmol/L (ref 135–145)
Total Bilirubin: 1 mg/dL (ref 0.3–1.2)
Total Protein: 7.8 g/dL (ref 6.5–8.1)

## 2020-11-19 LAB — GLUCOSE, CAPILLARY
Glucose-Capillary: 110 mg/dL — ABNORMAL HIGH (ref 70–99)
Glucose-Capillary: 110 mg/dL — ABNORMAL HIGH (ref 70–99)

## 2020-11-19 LAB — CBG MONITORING, ED
Glucose-Capillary: 128 mg/dL — ABNORMAL HIGH (ref 70–99)
Glucose-Capillary: 182 mg/dL — ABNORMAL HIGH (ref 70–99)

## 2020-11-19 LAB — MAGNESIUM: Magnesium: 1.9 mg/dL (ref 1.7–2.4)

## 2020-11-19 MED ORDER — TORSEMIDE 20 MG PO TABS
50.0000 mg | ORAL_TABLET | Freq: Every day | ORAL | Status: DC
  Administered 2020-11-19 – 2020-11-21 (×3): 50 mg via ORAL
  Filled 2020-11-19 (×3): qty 3

## 2020-11-19 MED ORDER — BARRIER CREAM NON-SPECIFIED
1.0000 "application " | TOPICAL_CREAM | Freq: Two times a day (BID) | TOPICAL | Status: DC | PRN
  Filled 2020-11-19: qty 1

## 2020-11-19 MED ORDER — VANCOMYCIN HCL IN DEXTROSE 1-5 GM/200ML-% IV SOLN
1000.0000 mg | INTRAVENOUS | Status: DC
  Filled 2020-11-19: qty 200

## 2020-11-19 NOTE — ED Notes (Signed)
Report given to April, RN

## 2020-11-19 NOTE — ED Notes (Signed)
Pt continues to sleep. NAD noted. 

## 2020-11-19 NOTE — ED Notes (Signed)
Pt awake and alert.  Daughter at bedside.

## 2020-11-19 NOTE — ED Notes (Signed)
I went in pt's room to introduce myself, but she appears to be asleep. NAD noted.

## 2020-11-19 NOTE — Progress Notes (Signed)
PROGRESS NOTE    [name]  ZOX:[id number] DOB: [birth year] DOA: 11/18/2020 PCP: [name], MD   Brief Narrative:  The patient is an 86 year old overweight Caucasian female with a past medical history significant for but not limited to dementia, hypothyroidism, CKD stage IIIb, hypertension, hyperlipidemia, diabetes mellitus type 2, atrial fibrillation, history of chronic diastolic CHF, CAD status post CABG, history of DVT, rheumatoid arthritis, history of recurrent UTIs, history of CVA who presented with some increased confusion, hypoxia and cough.  Patient is a poor historian so most of the history is obtained from the chart review and family and she is admitted from [facility] with increased confusion, hypoxia at the facility and ongoing cough.  She has a history of recurrent UTIs and has had a single dose of fosfomycin on 11/10/2020.  At her facility she was reportedly have a saturation around 70% and was 85 in the EMS and improved with 2 L in route.  In the ED her vital signs were significant for elevated respiratory rate.  Lab work-up showed a BUN/creatinine of 25/1.27.  Troponins were flat but she did have elevated BNP that was elevated to 400.  Urinalysis showed leukocytes and white cells and bacteria and chest x-ray with no acute abnormality.  CTA PE done showed no PE but there was cardiomegaly and trace pulmonary edema and age-indeterminate T6 compression fracture with recommendation for MRI and follow-up.  Antibiotics per pharmacy for UTI were ordered in the ED due to her advanced allergies and recent treatment.   Assessment & Plan:   Principal Problem:   Acute metabolic encephalopathy Active Problems:   Hypothyroidism   ATRIAL FIBRILLATION   Chronic diastolic heart failure (HCC)   CEREBROVASCULAR DISEASE   PERIPHERAL VASCULAR DISEASE   DNR (do not resuscitate)   Rheumatoid arthritis (HCC)   Diabetes mellitus with renal complications (HCC)   Frequent UTI   CKD (chronic  kidney disease) stage 3, GFR 30-59 ml/min (HCC)   CAD (coronary artery disease)   Hyperlipidemia   Hypertension   Vascular dementia, uncomplicated (HCC)    Acute Encephalopathy in the setting of UTI and Hypoxia  UTI Recurrent UTI -Metabolic encephalopathy suspected early due to UTI. -Has history of recurrent UTI had previously been on methenamine. > Recently treated for UTI about a week ago with fosfomycin.  Has presented similarly with UTI in the past.  Does have leukocytes, white cells and bacteria in urine. -Pharmacy consulted for antibiotics in the ED due to patient's recent antibiotics as well as allergies. - No fever or leukocytosis in the ED.  Urine studies ordered unfortunately urine culture was not sent until after she had received antibiotics -Urinalysis showed straw-colored urine with negative glucose, negative hemoglobin, negative ketones, large leukocytes, rare bacteria, 0-5 squamous epithelial cells and greater than 50 WBCs -Continue with antibiotics per pharmacy -Trend fever curve and white count -Follow-up urine cultures but doubt that there will be any use now as she has been on antibiotics with IV aztreonam and IV vancomycin -Continue with PT OT -May discussed with urology about her recurrent UTIs   Acute Respiratory Failure with Hypoxia volume overload likely from acute on chronic diastolic CHF >Initially required 2 L in the ED.   -However she has been weaned off this in the ED without significant intervention.   -SpO2: 95 % O2 Flow Rate (L/min): 2 L/min -Continuous pulse oximetry maintain O2 saturations greater than 90% -Continue supplemental oxygen via nasal cannula and wean to room air -Continue monitor respiratory status  carefully -We will continue to monitor and consider diuresis if recurs as below. -We will need an ambulatory home O2 screen prior to discharge and repeat chest x-ray in a.m.   Acute on chronic diastolic CHF  > History of diastolic heart  failure.   -Last Echo in our system on February of this year with EF 55-60%, hypertrophy with indeterminate diastolic parameters as well as normal RV function > Mildly elevated BNP to 400.  No significant edema CT scan showed some volume overload.  -Hypoxia as above has resolved in the ED. rales noted on exam. - Low dose IV lasix and given 20 mg x 1 -Strict I's and O's and daily weights; she is -1500 mL - Continue home Torsemide -Continue to monitor for signs and symptoms of volume overload -Pete chest x-ray in the a.m. and will need ambulatory home O2 screen prior to discharge   Dementia -Continue home Namenda  ?Abdominal Pain -Patient has Dementia and on exam she appeared to be nontender did not grimace but could not tell me if she was having abdominal pain or not -We discussed with vascular about obtaining vascular imaging given her mesenteric artery stenosis and stenting   Imaging abnormality > CT scan showed signs of age-indeterminate compression fracture.   -Recommending MRI for follow-up. -Obtain PT and OT   Hypothyroidism -Check TSH -Continue home Synthroid  GERD -Continue PPI with pantoprazole   CKD 3B > Creatinine stable in the ED. BUNs/creatinine is now 27/1.30 - Avoid nephrotoxic agents, contrast dyes, hypotension renally dose medications - Trend renal function and electrolytes   Hypertension > Blood pressure stable in the ED - Continue home diltiazem, metoprolol -Continue monitor blood pressures per protocol   Diabetes - SSI -CBGs ranging from 110-210   A. Fib -Continue home Diltiazem, metoprolol, Pradaxa -Continue to Monitor on Telemetry   CAD status post CABG - Continue home metoprolol, statin   History of DVT -On chronic Pradaxa as above.   Rheumatoid Arthritis  -Had previously been on methotrexate however is not currently taking this.   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  -Continue home Statin  -We will discuss with vascular about utility of inpatient mesenteric duplex to follow her SMA stenting  Seizure disorder as a result of stroke -Continue home Keppra  DVT prophylaxis: Anticoagulated with Pradaxa Code Status: FULL CODE  Family Communication: Discussed with the daughter present at bedside Disposition Plan: Will need PT OT further evaluation recommendations  Status is: Inpatient  Remains inpatient appropriate because:Unsafe d/c plan, IV treatments appropriate due to intensity of illness or inability to take PO, and Inpatient level of care appropriate due to severity of illness  Dispo: The patient is from: Home              Anticipated d/c is to:  TBD              Patient currently is not medically stable to d/c.   Difficult to place patient No   Consultants:  None  Procedures:   Antimicrobials:  Anti-infectives (From admission, onward)    Start     Dose/Rate Route Frequency Ordered Stop   11/20/20 2200  vancomycin (VANCOREADY) IVPB 1250 mg/250 mL  Status:  Discontinued        1,250 mg 166.7 mL/hr over 90 Minutes Intravenous Every 48 hours 11/18/20 2129 11/19/20 0841   11/20/20 2200  vancomycin (VANCOCIN) IVPB 1000 mg/200 mL premix  1,000 mg 200 mL/hr over 60 Minutes Intravenous Every 48 hours 11/19/20 0841     11/19/20 1000  methenamine (MANDELAMINE) tablet 500 mg        500 mg Oral 2 times daily 11/18/20 2155     11/18/20 2200  aztreonam (AZACTAM) 0.5 g in dextrose 5 % 50 mL IVPB        0.5 g 100 mL/hr over 30 Minutes Intravenous Every 8 hours 11/18/20 2104     11/18/20 2115  vancomycin (VANCOREADY) IVPB 1500 mg/300 mL        1,500 mg 150 mL/hr over 120 Minutes Intravenous  Once 11/18/20 2104 11/18/20 2359        Subjective: Seen and examined at bedside and she is pleasantly confused due to her dementia. She is slower to respond given the dementia and an unreliable historian. Still has a cough buit not as bad. ?  Abdominal Pain as patient not complaining of Abdominal pain.   Objective: Vitals:   11/19/20 1215 11/19/20 1415 11/19/20 1500 11/19/20 1638  BP: (!) 157/88 (!) 118/49 106/75 134/74  Pulse: 74 (!) 39 (!) 35 (!) 59  Resp: (!) 21 17 (!) 24 18  Temp:    98.3 F (36.8 C)  TempSrc:    Oral  SpO2: 93% 93% 93% 95%  Weight:      Height:        Intake/Output Summary (Last 24 hours) at 11/19/2020 1904 Last data filed at 11/19/2020 1529 Gross per 24 hour  Intake 350 ml  Output 1850 ml  Net -1500 ml   Filed Weights   11/18/20 2000  Weight: 65.8 kg   Examination: Physical Exam:  Constitutional: WN/WD overweight pleasantly demented elderly Caucasian female currently in NAD and appears calm and comfortable Eyes: Lids and conjunctivae normal, sclerae anicteric  ENMT: External Ears, Nose appear normal. Grossly normal hearing. Neck: Appears normal, supple, no cervical masses, normal ROM, no appreciable thyromegaly; no JVD Respiratory: Diminished to auscultation bilaterally, no wheezing, rales, rhonchi or crackles. Normal respiratory effort and patient is not tachypenic. No accessory muscle use.  Unlabored breathing Cardiovascular: RRR, no murmurs / rubs / gallops. S1 and S2 auscultated.  Trace edema Abdomen: Soft, does not appear to be tender on palpation, distended secondary body habitus. Bowel sounds positive.  GU: Deferred. Musculoskeletal: No clubbing / cyanosis of digits/nails. No joint deformity upper and lower extremities.  Skin: No rashes, lesions, ulcers on limited skin evaluation. No induration; Warm and dry.  Neurologic: CN 2-12 grossly intact with no focal deficits.  Psychiatric: Impaired judgment and insight. Awake but not fully Alert and oriented x 3. Pleasantly demented mood and appropriate affect.   Data Reviewed: I have personally reviewed following labs and imaging studies  CBC: Recent Labs  Lab 11/18/20 1250 11/19/20 0304  WBC 9.6 8.8  NEUTROABS 5.4  --   HGB 13.6  13.5  HCT 43.7 42.1  MCV 100.2* 99.1  PLT 165 146*   Basic Metabolic Panel: Recent Labs  Lab 11/18/20 1250 11/19/20 0304  NA 138 140  K 4.3 4.0  CL 101 102  CO2 27 28  GLUCOSE 132* 123*  BUN 25* 27*  CREATININE 1.27* 1.30*  CALCIUM 10.1 9.6  MG  --  1.9   GFR: Estimated Creatinine Clearance: 26.6 mL/min (A) (by C-G formula based on SCr of 1.3 mg/dL (H)). Liver Function Tests: Recent Labs  Lab 11/18/20 1250 11/19/20 0304  AST 17 16  ALT 11 10  ALKPHOS 68 61  BILITOT 0.8 1.0  PROT 8.3* 7.8  ALBUMIN 3.2* 2.9*   No results for input(s): LIPASE, AMYLASE in the last 168 hours. No results for input(s): AMMONIA in the last 168 hours. Coagulation Profile: No results for input(s): INR, PROTIME in the last 168 hours. Cardiac Enzymes: No results for input(s): CKTOTAL, CKMB, CKMBINDEX, TROPONINI in the last 168 hours. BNP (last 3 results) No results for input(s): PROBNP in the last 8760 hours. HbA1C: No results for input(s): HGBA1C in the last 72 hours. CBG: Recent Labs  Lab 11/18/20 2225 11/19/20 0810 11/19/20 1159 11/19/20 1639  GLUCAP 210* 128* 182* 110*   Lipid Profile: No results for input(s): CHOL, HDL, LDLCALC, TRIG, CHOLHDL, LDLDIRECT in the last 72 hours. Thyroid Function Tests: No results for input(s): TSH, T4TOTAL, FREET4, T3FREE, THYROIDAB in the last 72 hours. Anemia Panel: No results for input(s): VITAMINB12, FOLATE, FERRITIN, TIBC, IRON, RETICCTPCT in the last 72 hours. Sepsis Labs: No results for input(s): PROCALCITON, LATICACIDVEN in the last 168 hours.  Recent Results (from the past 240 hour(s))  Resp Panel by RT-PCR (Flu A&B, Covid) Nasopharyngeal Swab     Status: None   Collection Time: 11/18/20  1:48 PM   Specimen: Nasopharyngeal Swab; Nasopharyngeal(NP) swabs in vial transport medium  Result Value Ref Range Status   SARS Coronavirus 2 by RT PCR NEGATIVE NEGATIVE Final    Comment: (NOTE) SARS-CoV-2 target nucleic acids are NOT  DETECTED.  The SARS-CoV-2 RNA is generally detectable in upper respiratory specimens during the acute phase of infection. The lowest concentration of SARS-CoV-2 viral copies this assay can detect is 138 copies/mL. A negative result does not preclude SARS-Cov-2 infection and should not be used as the sole basis for treatment or other patient management decisions. A negative result may occur with  improper specimen collection/handling, submission of specimen other than nasopharyngeal swab, presence of viral mutation(s) within the areas targeted by this assay, and inadequate number of viral copies(<138 copies/mL). A negative result must be combined with clinical observations, patient history, and epidemiological information. The expected result is Negative.  Fact Sheet for Patients:  BloggerCourse.com  Fact Sheet for Healthcare Providers:  SeriousBroker.it  This test is no t yet approved or cleared by the [place] FDA and  has been authorized for detection and/or diagnosis of SARS-CoV-2 by FDA under an Emergency Use Authorization (EUA). This EUA will remain  in effect (meaning this test can be used) for the duration of the COVID-19 declaration under Section 564(b)(1) of the Act, 21 U.S.C.section 360bbb-3(b)(1), unless the authorization is terminated  or revoked sooner.       Influenza A by PCR NEGATIVE NEGATIVE Final   Influenza B by PCR NEGATIVE NEGATIVE Final    Comment: (NOTE) The Xpert Xpress SARS-CoV-2/FLU/RSV plus assay is intended as an aid in the diagnosis of influenza from Nasopharyngeal swab specimens and should not be used as a sole basis for treatment. Nasal washings and aspirates are unacceptable for Xpert Xpress SARS-CoV-2/FLU/RSV testing.  Fact Sheet for Patients: BloggerCourse.com  Fact Sheet for Healthcare Providers: SeriousBroker.it  This test is not yet  approved or cleared by the [place] FDA and has been authorized for detection and/or diagnosis of SARS-CoV-2 by FDA under an Emergency Use Authorization (EUA). This EUA will remain in effect (meaning this test can be used) for the duration of the COVID-19 declaration under Section 564(b)(1) of the Act, 21 U.S.C. section 360bbb-3(b)(1), unless the authorization is terminated or revoked.  Performed at [facility], [address] [place], [place] [address]     RN Pressure Injury Documentation:     Estimated body mass index is 26.52 kg/m as calculated from the following:   Height as of this encounter: 5\' 2"  (1.575 m).   Weight as of this encounter: 65.8 kg.  Malnutrition Type:  Malnutrition Characteristics:   Nutrition Interventions:    Radiology Studies: CT Angio Chest PE W/Cm &/Or Wo Cm  Result Date: 11/18/2020 CLINICAL DATA:  87 year old female with cough, shortness of breath, hypoxia, concern for pulmonary embolism. EXAM: CT ANGIOGRAPHY CHEST WITH CONTRAST TECHNIQUE: Multidetector CT imaging of the chest was performed using the standard protocol during bolus administration of intravenous contrast. Multiplanar CT image reconstructions and MIPs were obtained to evaluate the vascular anatomy. CONTRAST:  Sixty mL Omnipaque 350, intravenous COMPARISON:  04/29/2008 FINDINGS: Cardiovascular: Satisfactory opacification of the pulmonary arteries to the segmental level. No evidence of pulmonary embolism. Severe biatrial cardiomegaly. Severe mitral annular calcifications. Aortic valvular calcifications are present. Severe native coronary atherosclerotic calcifications. Postsurgical changes after coronary artery bypass graft. Scattered atherosclerotic calcifications of the normal caliber thoracic aorta. No pericardial effusion. Mediastinum/Nodes: No enlarged mediastinal, hilar, or axillary lymph nodes. Thyroid gland, trachea, and esophagus demonstrate no significant findings.  Lungs/Pleura: Minimal basal and dependent interlobular septal thickening. No overt pulmonary edema. No pleural effusions or pneumothorax. No focal consolidations. No suspicious pulmonary nodules. Upper Abdomen: The visualized upper abdomen is within normal limits. Musculoskeletal: Sigmoid curvature of the thoracic spine. Age-indeterminate anterior wedge compression deformity of the T6 vertebral body. No retropulsion. Status post median sternotomy. Review of the MIP images confirms the above findings. IMPRESSION: Vascular: 1. No evidence of pulmonary embolism. 2. Severe biatrial cardiomegaly. 3. Coronary and aortic atherosclerosis (ICD10-I70.0). Non-Vascular: 1. Trace pulmonary edema, likely cardiogenic. 2. Age-indeterminate anterior wedge compression deformity of the T6 vertebral body, no retropulsion. If there is clinical concern for acuity, recommend thoracic spine MRI further evaluation. 05/01/2008, MD [facility] Radiology Electronically Signed   By: [facility] MD   On: 11/18/2020 16:26   DG Chest Port 1 View  Result Date: 11/18/2020 CLINICAL DATA:  Cough and hypoxia. EXAM: PORTABLE CHEST 1 VIEW COMPARISON:  05/19/2019 FINDINGS: Previous median sternotomy and CABG procedure. Stable cardiomediastinal contours. No signs of pleural effusion or edema. No airspace consolidation. The visualized osseous structures are unremarkable. IMPRESSION: No acute cardiopulmonary abnormalities. Electronically Signed   By: 07/17/2019 M.D.   On: 11/18/2020 14:42    Scheduled Meds:  calcium-vitamin D  1 tablet Oral BID WC   dabigatran  75 mg Oral Q12H   diltiazem  180 mg Oral QHS   docusate sodium  100 mg Oral BID   insulin aspart  0-9 Units Subcutaneous TID WC   levETIRAcetam  250 mg Oral QHS   levothyroxine  75 mcg Oral QAC breakfast   memantine  10 mg Oral Daily   methenamine  500 mg Oral BID   metoprolol succinate  25 mg Oral Daily   mirtazapine  7.5 mg  Oral QHS   pantoprazole  40 mg Oral Daily   polyvinyl alcohol  1 drop Both Eyes QID   pravastatin  40 mg Oral QHS   sodium chloride flush  3 mL Intravenous Q12H   Continuous Infusions:  aztreonam Stopped (11/19/20 0808)   [START ON 11/20/2020] vancomycin      LOS: 0 days   01/20/2021, DO [facility] PAGER is on AMION  If 7PM-7AM, please contact night-coverage  [email or web address]

## 2020-11-19 NOTE — ED Notes (Signed)
Pt purewick changed out for new purewick. Skin checked, pericare performed.

## 2020-11-20 ENCOUNTER — Inpatient Hospital Stay (HOSPITAL_COMMUNITY): Payer: Medicare PPO

## 2020-11-20 LAB — CBC WITH DIFFERENTIAL/PLATELET
Abs Immature Granulocytes: 0.01 10*3/uL (ref 0.00–0.07)
Basophils Absolute: 0 10*3/uL (ref 0.0–0.1)
Basophils Relative: 1 %
Eosinophils Absolute: 0.7 10*3/uL — ABNORMAL HIGH (ref 0.0–0.5)
Eosinophils Relative: 9 %
HCT: 38.7 % (ref 36.0–46.0)
Hemoglobin: 12.6 g/dL (ref 12.0–15.0)
Immature Granulocytes: 0 %
Lymphocytes Relative: 33 %
Lymphs Abs: 2.6 10*3/uL (ref 0.7–4.0)
MCH: 31.3 pg (ref 26.0–34.0)
MCHC: 32.6 g/dL (ref 30.0–36.0)
MCV: 96.3 fL (ref 80.0–100.0)
Monocytes Absolute: 0.7 10*3/uL (ref 0.1–1.0)
Monocytes Relative: 8 %
Neutro Abs: 3.9 10*3/uL (ref 1.7–7.7)
Neutrophils Relative %: 49 %
Platelets: 150 10*3/uL (ref 150–400)
RBC: 4.02 MIL/uL (ref 3.87–5.11)
RDW: 14.3 % (ref 11.5–15.5)
WBC: 7.9 10*3/uL (ref 4.0–10.5)
nRBC: 0 % (ref 0.0–0.2)

## 2020-11-20 LAB — RESPIRATORY PANEL BY PCR

## 2020-11-20 LAB — COMPREHENSIVE METABOLIC PANEL
ALT: 8 U/L (ref 0–44)
AST: 15 U/L (ref 15–41)
Albumin: 2.8 g/dL — ABNORMAL LOW (ref 3.5–5.0)
Alkaline Phosphatase: 61 U/L (ref 38–126)
Anion gap: 11 (ref 5–15)
BUN: 35 mg/dL — ABNORMAL HIGH (ref 8–23)
CO2: 27 mmol/L (ref 22–32)
Calcium: 9.6 mg/dL (ref 8.9–10.3)
Chloride: 99 mmol/L (ref 98–111)
Creatinine, Ser: 1.65 mg/dL — ABNORMAL HIGH (ref 0.44–1.00)
GFR, Estimated: 30 mL/min — ABNORMAL LOW (ref >60)
Glucose, Bld: 120 mg/dL — ABNORMAL HIGH (ref 70–99)
Potassium: 3.7 mmol/L (ref 3.5–5.1)
Sodium: 137 mmol/L (ref 135–145)
Total Bilirubin: 0.7 mg/dL (ref 0.3–1.2)
Total Protein: 7.6 g/dL (ref 6.5–8.1)

## 2020-11-20 LAB — GLUCOSE, CAPILLARY
Glucose-Capillary: 128 mg/dL — ABNORMAL HIGH (ref 70–99)
Glucose-Capillary: 143 mg/dL — ABNORMAL HIGH (ref 70–99)
Glucose-Capillary: 123 mg/dL — ABNORMAL HIGH (ref 70–99)
Glucose-Capillary: 140 mg/dL — ABNORMAL HIGH (ref 70–99)

## 2020-11-20 LAB — URINE CULTURE: Culture: <10000 — AB

## 2020-11-20 LAB — PHOSPHORUS: Phosphorus: 4 mg/dL (ref 2.5–4.6)

## 2020-11-20 LAB — MAGNESIUM: Magnesium: 1.9 mg/dL (ref 1.7–2.4)

## 2020-11-20 IMAGING — DX DG CHEST 1V PORT
1 series · 1 of 1 positions shown · non-contrast
Comparison: Chest x-ray and CT chest [DATE]

CLINICAL DATA: Short of breath.  History of CHF.

EXAM:
PORTABLE CHEST 1 VIEW

[chest]
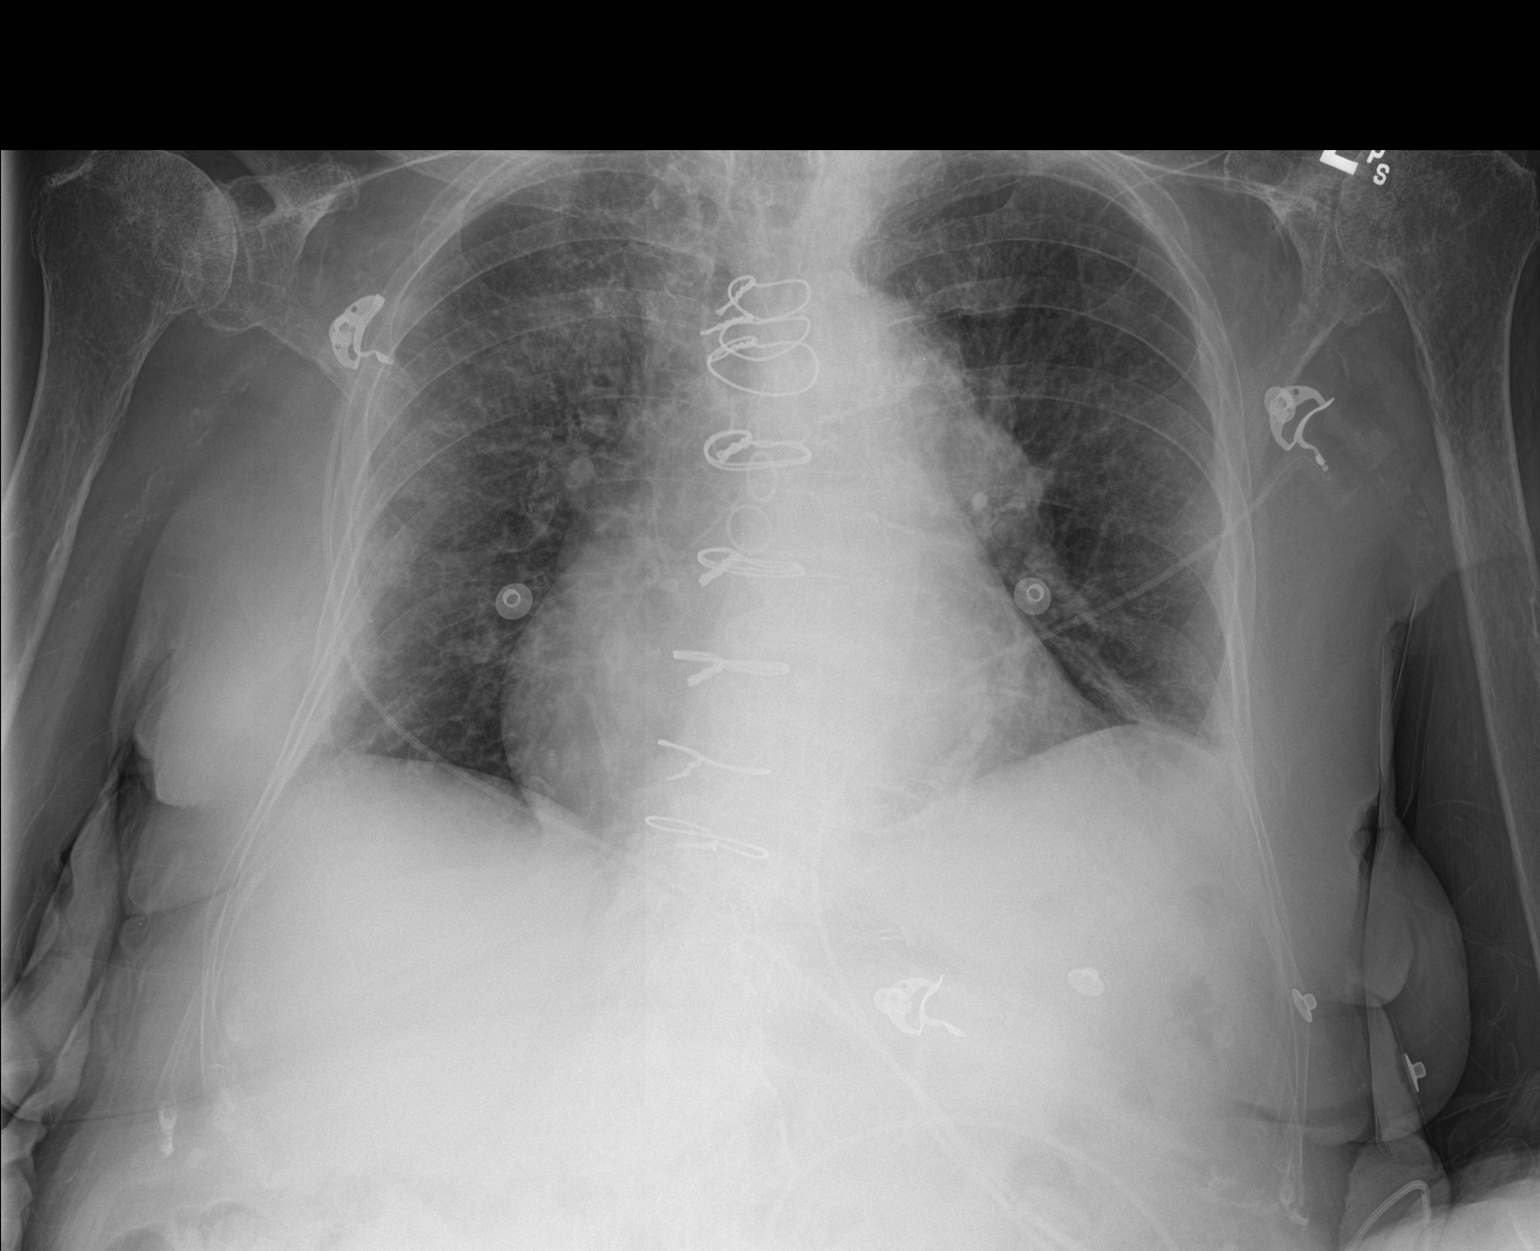

[1 of 1 positions shown; findings below may reference images not displayed]

FINDINGS: Postop CABG. Negative for heart failure or edema. Prominent lung
markings most consistent with chronic lung disease. Mild left lower
lobe atelectasis or scarring. Negative for pneumonia or effusion.

Degenerative change in both shoulders.
IMPRESSION: Negative for heart failure or pneumonia. Mild left lower lobe
atelectasis. Suspect underlying chronic lung disease.

## 2020-11-20 MED ORDER — IPRATROPIUM BROMIDE 0.02 % IN SOLN: 0.5000 mg | Freq: Four times a day (QID) | RESPIRATORY_TRACT | Status: DC | PRN

## 2020-11-20 MED ORDER — LEVALBUTEROL HCL 0.63 MG/3ML IN NEBU
0.6300 mg | INHALATION_SOLUTION | Freq: Four times a day (QID) | RESPIRATORY_TRACT | Status: DC
  Administered 2020-11-20: 0.63 mg via RESPIRATORY_TRACT
  Filled 2020-11-20 (×2): qty 3

## 2020-11-20 MED ORDER — BACITRACIN ZINC 500 UNIT/GM EX OINT
TOPICAL_OINTMENT | CUTANEOUS | Status: DC | PRN
  Filled 2020-11-20: qty 28.4

## 2020-11-20 MED ORDER — LEVALBUTEROL HCL 0.63 MG/3ML IN NEBU: 0.6300 mg | INHALATION_SOLUTION | Freq: Four times a day (QID) | RESPIRATORY_TRACT | Status: DC | PRN

## 2020-11-20 MED ORDER — BENZONATATE 100 MG PO CAPS
100.0000 mg | ORAL_CAPSULE | Freq: Three times a day (TID) | ORAL | Status: DC | PRN
  Administered 2020-11-21 – 2020-11-22 (×2): 100 mg via ORAL
  Filled 2020-11-20 (×2): qty 1

## 2020-11-20 MED ORDER — IPRATROPIUM BROMIDE 0.02 % IN SOLN
0.5000 mg | Freq: Four times a day (QID) | RESPIRATORY_TRACT | Status: DC
  Administered 2020-11-20: 0.5 mg via RESPIRATORY_TRACT
  Filled 2020-11-20 (×2): qty 2.5

## 2020-11-20 NOTE — Evaluation (Signed)
Physical Therapy Evaluation Patient Details Name: [name] MRN: [id number] DOB: [birth year] Today's Date: 11/20/2020   History of Present Illness  87 y.o. female presents with some increased confusion, hypoxia, cough. CTA to r/o PE showed age-indeterminate T6 compression.Found to have UTI: PHMX: dementia, hypothyroidism, CKD 3B, hypertension, hyperlipidemia, diabetes, A. fib, CAD status post CABG, CHF (diastolic), history of DVT, rheumatoid arthritis, recurrent UTI, CVA who.  Clinical Impression  PTA, patient has been living at [facility] for 3 years and requires supervision for mobility with upright rollator. Patient presents with generalized weakness, decreased activity tolerance, impaired balance, and impaired cognition. Patient requires modA for sit to stand transfer and min guard for ambulation with upright rollator. Patient will benefit from skilled PT services during acute stay to address listed deficits. Recommend return to SNF to maximize functional mobility and safety.     Follow Up Recommendations SNF (return to [facility])    Equipment Recommendations  None recommended by PT    Recommendations for Other Services       Precautions / Restrictions Precautions Precautions: Fall Restrictions Weight Bearing Restrictions: No      Mobility  Bed Mobility               General bed mobility comments: Up in recliner with daughter in the room    Transfers Overall transfer level: Needs assistance Equipment used:  (Upright rollator) Transfers: Sit to/from Stand Sit to Stand: Mod assist            Ambulation/Gait Ambulation/Gait assistance: Min guard Gait Distance (Feet): 20 Feet (x10') Assistive device:  (upright rollator) Gait Pattern/deviations: Step-through pattern;Decreased stride length;Decreased weight shift to right;Decreased weight shift to left;Trunk flexed;Narrow base of support Gait velocity: decreased   General Gait Details: slow step through  gait. Step by step cueing for turning  Stairs            Wheelchair Mobility    Modified Rankin (Stroke Patients Only)       Balance Overall balance assessment: Needs assistance Sitting-balance support: No upper extremity supported;Feet supported Sitting balance-Leahy Scale: Fair     Standing balance support: Bilateral upper extremity supported Standing balance-Leahy Scale: Poor Standing balance comment: reliant on UE support                             Pertinent Vitals/Pain Pain Assessment: No/denies pain    Home Living Family/patient expects to be discharged to:: Skilled nursing facility                 Additional Comments: [facility].    Prior Function Level of Independence: Needs assistance   Gait / Transfers Assistance Needed: Ambulates with use of upright rolling Edgardo Petrenko and some external assist from staff/family.  ADL's / Homemaking Assistance Needed: Pt is total A for all self care except can feed self with built up utensils        Hand Dominance   Dominant Hand: Right    Extremity/Trunk Assessment   Upper Extremity Assessment Upper Extremity Assessment: Defer to OT evaluation    Lower Extremity Assessment Lower Extremity Assessment: Generalized weakness    Cervical / Trunk Assessment Cervical / Trunk Assessment: Kyphotic  Communication   Communication: No difficulties  Cognition Arousal/Alertness: Awake/alert Behavior During Therapy: WFL for tasks assessed/performed Overall Cognitive Status: History of cognitive impairments - at baseline  General Comments      Exercises     Assessment/Plan    PT Assessment Patient needs continued PT services  PT Problem List Decreased strength;Decreased activity tolerance;Decreased balance;Decreased mobility;Decreased cognition;Decreased safety awareness       PT Treatment Interventions DME instruction;Gait  training;Functional mobility training;Therapeutic activities;Therapeutic exercise;Balance training;Patient/family education    PT Goals (Current goals can be found in the Care Plan section)  Acute Rehab PT Goals Patient Stated Goal: Dtr--wants pt to go to a different facility PT Goal Formulation: With patient/family Time For Goal Achievement: 12/04/20 Potential to Achieve Goals: Good    Frequency Min 2X/week   Barriers to discharge        Co-evaluation               AM-PAC PT "6 Clicks" Mobility  Outcome Measure Help needed turning from your back to your side while in a flat bed without using bedrails?: A Little Help needed moving from lying on your back to sitting on the side of a flat bed without using bedrails?: A Little Help needed moving to and from a bed to a chair (including a wheelchair)?: A Little Help needed standing up from a chair using your arms (e.g., wheelchair or bedside chair)?: A Lot Help needed to walk in hospital room?: A Little Help needed climbing 3-5 steps with a railing? : Total 6 Click Score: 15    End of Session Equipment Utilized During Treatment: Gait belt Activity Tolerance: Patient tolerated treatment well Patient left: in chair;with call bell/phone within reach;with family/visitor present Nurse Communication: Mobility status PT Visit Diagnosis: Unsteadiness on feet (R26.81);Muscle weakness (generalized) (M62.81)    Time: 6226-3335 PT Time Calculation (min) (ACUTE ONLY): 27 min   Charges:   PT Evaluation $PT Eval Moderate Complexity: 1 Mod PT Treatments $Gait Training: 8-22 mins        Ashani Pumphrey A. Dan Humphreys PT, DPT Acute Rehabilitation Services Pager [phone] Office [phone]   Viviann Spare 11/20/2020, 12:52 PM

## 2020-11-20 NOTE — Evaluation (Signed)
Occupational Therapy Evaluation Patient Details Name: [name] MRN: [id number] DOB: 1929/06/21 Today's Date: 11/20/2020    History of Present Illness [name] is a 87 y.o. female presents with some increased confusion, hypoxia, cough. CTA to r/o PE showed age-indeterminate T6 compression.Found to have UTI: PHMX: dementia, hypothyroidism, CKD 3B, hypertension, hyperlipidemia, diabetes, A. fib, CAD status post CABG, CHF (diastolic), history of DVT, rheumatoid arthritis, recurrent UTI, CVA who.   Clinical Impression   This 87 yo female admitted with above presents to acute OT with PLOF of being able to self feed with built up utensils, do some grooming tasks, and able to stand with min-mod A and ambulate with upright rollator with min guard A. Currently pt has been provided with red tubing to build up utensils and is Mod A sit<>stand and min guard A with VCs for ambulation with upright rollator. She will benefit from continued acute OT with follow up OT at SNF to get back to her baseline. We will continue to follow.    Follow Up Recommendations  SNF;Supervision/Assistance - 24 hour    Equipment Recommendations  None recommended by OT       Precautions / Restrictions Precautions Precautions: Fall Restrictions Weight Bearing Restrictions: No      Mobility Bed Mobility               General bed mobility comments: Pt up in recliner upon arrival with Dtr [name]) in room    Transfers Overall transfer level: Needs assistance Equipment used:  (upright rollator) Transfers: Sit to/from Stand Sit to Stand: Mod assist              Balance Overall balance assessment: Needs assistance Sitting-balance support: No upper extremity supported;Feet supported Sitting balance-Leahy Scale: Fair     Standing balance support: Bilateral upper extremity supported Standing balance-Leahy Scale: Poor                             ADL either performed or assessed with  clinical judgement   ADL Overall ADL's : Needs assistance/impaired Eating/Feeding: Set up;Sitting Eating/Feeding Details (indicate cue type and reason): with red tubing on utensils Grooming: Minimal assistance;Sitting   Upper Body Bathing: Total assistance   Lower Body Bathing: Total assistance Lower Body Bathing Details (indicate cue type and reason): Mod A sit<>stand Upper Body Dressing : Total assistance;Sitting   Lower Body Dressing: Total assistance Lower Body Dressing Details (indicate cue type and reason): Mod A sit<>stand Toilet Transfer: Min guard;Ambulation Toilet Transfer Details (indicate cue type and reason): upright rollator with Mod A sit<>stand Toileting- Clothing Manipulation and Hygiene: Total assistance Toileting - Clothing Manipulation Details (indicate cue type and reason): Mod A sit<>stand             Vision Patient Visual Report: No change from baseline              Pertinent Vitals/Pain Pain Assessment: No/denies pain     Hand Dominance Right   Extremity/Trunk Assessment Upper Extremity Assessment Upper Extremity Assessment:  (RA in both hands/arms)           Communication Communication Communication: No difficulties   Cognition Arousal/Alertness: Awake/alert Behavior During Therapy: WFL for tasks assessed/performed Overall Cognitive Status: History of cognitive impairments - at baseline  Home Living Family/patient expects to be discharged to:: Skilled nursing facility                                 Additional Comments: [name].      Prior Functioning/Environment Level of Independence: Needs assistance  Gait / Transfers Assistance Needed: Ambulates with use of upright rolling walker and some external assist from staff/family. ADL's / Homemaking Assistance Needed: Pt is total A for all self care except can feed self with built up utensils             OT Problem List: Decreased strength;Decreased range of motion;Impaired balance (sitting and/or standing);Impaired UE functional use      OT Treatment/Interventions: Self-care/ADL training;DME and/or AE instruction;Patient/family education;Balance training    OT Goals(Current goals can be found in the care plan section) Acute Rehab OT Goals Patient Stated Goal: Dtr--wants pt to go to a different facility OT Goal Formulation: With patient/family Time For Goal Achievement: 12/04/20 Potential to Achieve Goals: Good  OT Frequency: Min 2X/week    AM-PAC OT "6 Clicks" Daily Activity     Outcome Measure Help from another person eating meals?: A Little Help from another person taking care of personal grooming?: A Little Help from another person toileting, which includes using toliet, bedpan, or urinal?: A Lot Help from another person bathing (including washing, rinsing, drying)?: A Lot Help from another person to put on and taking off regular upper body clothing?: Total Help from another person to put on and taking off regular lower body clothing?: Total 6 Click Score: 12   End of Session Equipment Utilized During Treatment: Gait belt (upright rollator)  Activity Tolerance: Patient tolerated treatment well Patient left: in chair;with call bell/phone within reach  OT Visit Diagnosis: Unsteadiness on feet (R26.81);Other abnormalities of gait and mobility (R26.89);Muscle weakness (generalized) (M62.81)                Time: 2355-7322 OT Time Calculation (min): 31 min Charges:  OT General Charges $OT Visit: 1 Visit OT Evaluation $OT Eval Moderate Complexity: 1 Mod  [name], OTR/L Acute [employer] Pager [phone] Office [phone]    [name] 11/20/2020, 11:52 AM

## 2020-11-20 NOTE — Plan of Care (Signed)
  Problem: Health Behavior/Discharge Planning: Goal: Ability to manage health-related needs will improve Outcome: Progressing   Problem: Clinical Measurements: Goal: Ability to maintain clinical measurements within normal limits will improve Outcome: Progressing Goal: Will remain free from infection Outcome: Progressing Goal: Diagnostic test results will improve Outcome: Progressing Goal: Respiratory complications will improve Outcome: Progressing Goal: Cardiovascular complication will be avoided Outcome: Progressing   Problem: Safety: Goal: Ability to remain free from injury will improve Outcome: Progressing   

## 2020-11-20 NOTE — Progress Notes (Signed)
IR was requested for image guided Kyphoplasty/vertebroplasty  for T6 compression fracture.    Case was reviewed by Dr. Corliss Skains who recommends dedicated MRI of T-spine for further evaluation.   The procedure was discussed with patient and her daughter at the bedside.  Daughter states that patient has been complaining of upper back pain for couple months when she walks and lying on the bed, the pain has been affecting patient's daily life activities such as walking. Daughter states that patient has RA and osteoporosis, has been taking methotrexate for RA and vitamin D and calcium supplement for the osteoporosis.  Patient states that her upper back hurts, positive TTP on upper back.   Discussed the goal of the procedure is to reduce the pain by have not eliminate. Risks and benefits of KP/VP were discussed with the patient including, but not limited to bleeding, infection, and cement migration which may cause damage to adjacent structures.  Also discussed about patient's recurrent UTI, which may interfere with scheduling the kyphoplasty/vertebroplasty as KP/VP cannot be done when patient has active infection such as UTI.  Informed the patient and daughter that dedicated MRI of T-spine is needed to proceed with the procedure. Daughter verbalized understanding, wishes time to think about the MRI and KP/VP. Daughter states that it would be beneficial for the patient to undergo MRI during the admission rather than outpatient basis. Asked daughter to let RN/attending provider  know if she decide to proceed with MRI for further evaluation.  Patient is on Pradaxa 75 mg every 12 hours, which will need to be discontinued for 2 days prior to the kyphoplasty/vertebroplasty.   Ordering provider notified above discussion.   Please call IR for questions and concerns.   Denya Buckingham Rexene Edison Cameron Schwinn PA-C 11/20/2020 2:22 PM

## 2020-11-20 NOTE — Progress Notes (Signed)
PROGRESS NOTE    [name]  VHQ:[id number] DOB: 1929-07-02 DOA: 11/18/2020 PCP: [name], MD   Brief Narrative:  The patient is an 87 year old overweight Caucasian female with a past medical history significant for but not limited to dementia, hypothyroidism, CKD stage IIIb, hypertension, hyperlipidemia, diabetes mellitus type 2, atrial fibrillation, history of chronic diastolic CHF, CAD status post CABG, history of DVT, rheumatoid arthritis, history of recurrent UTIs, history of CVA who presented with some increased confusion, hypoxia and cough.  Patient is a poor historian so most of the history is obtained from the chart review and family and she is admitted from [facility] with increased confusion, hypoxia at the facility and ongoing cough.  She has a history of recurrent UTIs and has had a single dose of fosfomycin on 11/10/2020.  At her facility she was reportedly have a saturation around 70% and was 85 in the EMS and improved with 2 L in route.  In the ED her vital signs were significant for elevated respiratory rate.  Lab work-up showed a BUN/creatinine of 25/1.27.  Troponins were flat but she did have elevated BNP that was elevated to 400.  Urinalysis showed leukocytes and white cells and bacteria and chest x-ray with no acute abnormality.  CTA PE done showed no PE but there was cardiomegaly and trace pulmonary edema and age-indeterminate T6 compression fracture with recommendation for MRI and follow-up.  Antibiotics per pharmacy for UTI were ordered in the ED due to her advanced allergies and recent treatment and will de-escalate and stop IV vancomycin.  Patient continues to cough but chest x-ray today was unrevealing and did not show any evidence of active heart failure or pneumonia.  Likely she has a bronchitis so we we will obtain a respiratory virus panel and start her on a flutter valve, incentive spirometer as well as Xopenex and Atrovent.  She continued to complain about some  intermittent abdominal pain so we will obtain a mesenteric ultrasound to evaluate her celiac stent.   Assessment & Plan:   Principal Problem:   Acute metabolic encephalopathy Active Problems:   Hypothyroidism   ATRIAL FIBRILLATION   Chronic diastolic heart failure (HCC)   CEREBROVASCULAR DISEASE   PERIPHERAL VASCULAR DISEASE   DNR (do not resuscitate)   Rheumatoid arthritis (HCC)   Diabetes mellitus with renal complications (HCC)   Frequent UTI   CKD (chronic kidney disease) stage 3, GFR 30-59 ml/min (HCC)   CAD (coronary artery disease)   Hyperlipidemia   Hypertension   Vascular dementia, uncomplicated (HCC)    Acute Encephalopathy in the setting of UTI and Hypoxia  UTI Recurrent UTI -Metabolic encephalopathy suspected early due to UTI. -Has history of recurrent UTI had previously been on methenamine. > Recently treated for UTI about a week ago with fosfomycin.  Has presented similarly with UTI in the past.  Does have leukocytes, white cells and bacteria in urine. -Pharmacy consulted for antibiotics in the ED due to patient's recent antibiotics as well as allergies. - No fever or leukocytosis in the ED.  Urine studies ordered unfortunately urine culture was not sent until after she had received antibiotics -Urinalysis showed straw-colored urine with negative glucose, negative hemoglobin, negative ketones, large leukocytes, rare bacteria, 0-5 squamous epithelial cells and greater than 50 WBCs; Repeat Cx showed <10,000 CFU  -Continue with antibiotics per pharmacy and will stop IV Vanc -Trend fever curve and white count -Follow-up urine cultures but doubt that there will be any use now as she has been  on antibiotics with IV aztreonam and IV vancomycin -Continue with PT OT -She will need to see Urology in the outpatient settings to discuss with them about Recurrent UTI's    Acute Respiratory Failure with Hypoxia volume overload likely from acute on chronic diastolic CHF versus  possible bronchitis now >Initially required 2 L in the ED.   -However she has been weaned off this in the ED without significant intervention.   -SpO2: 97 % O2 Flow Rate (L/min): 2 L/min -Continuous pulse oximetry maintain O2 saturations greater than 90% -Continue supplemental oxygen via nasal cannula and wean to room air -Continue monitor respiratory status carefully -Check Respiratory Virus Panel and place on Droplet -C/w Xopenex/Atrovent -Flutter Valve and Incentive Spirometry  -We will continue to monitor and consider diuresis if recurs as below. -We will need an ambulatory home O2 screen prior to discharge and repeat chest x-ray in a.m.; chest x-ray this a.m. did not show evidence of heart failure or pneumonia   Acute on Chronic Diastolic CHF, improved > History of diastolic heart failure.   -Last Echo in our system on February of this year with EF 55-60%, hypertrophy with indeterminate diastolic parameters as well as normal RV function > Mildly elevated BNP to 400.  No significant edema CT scan showed some volume overload.  -Hypoxia as above has resolved in the ED. rales noted on exam. - Low dose IV lasix and given 20 mg x 1 -Strict I's and O's and daily weights; she is -1467.2 mL -Continue home Torsemide -Continue to monitor for signs and symptoms of volume overload -Repeat chest x-ray in the a.m. and will need ambulatory home O2 screen prior to discharge   Dementia -Continue home Namenda  ?Abdominal Pain -Patient has Dementia and on exam she appeared to be nontender did not grimace but could not tell me if she was having abdominal pain or not -Discussed with Dr. About obtaining vascular imaging given her mesenteric artery and celiac stenosis and stenting getting a mesenteric ultrasound while she is in the hospital   T6 Compression Fracture  > CT scan showed signs of age-indeterminate compression fracture.  Patient does have some pain there -Recommending MRI for follow-up and  I spoke to the patient daughter about this and we also consulted IR.  IR would like an MRI before possible KP or VP but patient's Daughter to decide whether she wants to pursue this or Not -Obtain PT and OT   Hypothyroidism -Check TSH -Continue home Synthroid  GERD -Continue PPI with pantoprazole 40 mg po Daily    CKD 3B > Creatinine stable in the ED. BUNs/creatinine is now 27/1.30 -> 35/1.65 - Avoid nephrotoxic agents, contrast dyes, hypotension renally dose medications - Trend renal function and electrolytes - Repeat CMP in the AM    Hypertension > Blood pressure stable in the ED - Continue home diltiazem, metoprolol -Continue monitor blood pressures per protocol -Last BP was 139/57   Diabetes - SSI -CBGs ranging from 110-143   A. Fib -Continue home Diltiazem, metoprolol, Pradaxa -Continue to Monitor on Telemetry   CAD status post CABG - Continue home metoprolol, statin   History of DVT -On chronic Pradaxa as above.   Rheumatoid Arthritis  -Had previously been on methotrexate however is not currently taking this.   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 Wilford Corner "If there is significant concern for drowsiness with Keppra, change it to once every other day for a week and discontinue.  Please follow-up with your outpatient neurologist in the next 2 weeks.  Maintain seizure precautions" -Will discuss Neurology Recc's with the Daughter in the AM    DVT prophylaxis: Anticoagulated with Pradaxa Code Status: FULL CODE  Family Communication: Discussed with the daughter  present at bedside extensively Disposition Plan: Will need PT OT further evaluation recommendations and they are recommending SNF  Status is: Inpatient  Remains inpatient appropriate because:Unsafe d/c plan, IV treatments appropriate due to intensity of illness or inability to take PO, and Inpatient level of care appropriate due to severity of illness  Dispo: The patient is from: Home              Anticipated d/c is to:  TBD              Patient currently is not medically stable to d/c.   Difficult to place patient No   Consultants:  Discussed case with Neurology Discussed case with Vascular  Procedures:   Antimicrobials:  Anti-infectives (From admission, onward)    Start     Dose/Rate Route Frequency Ordered Stop   11/20/20 2200  vancomycin (VANCOREADY) IVPB 1250 mg/250 mL  Status:  Discontinued        1,250 mg 166.7 mL/hr over 90 Minutes Intravenous Every 48 hours 11/18/20 2129 11/19/20 0841   11/20/20 2200  vancomycin (VANCOCIN) IVPB 1000 mg/200 mL premix  Status:  Discontinued        1,000 mg 200 mL/hr over 60 Minutes Intravenous Every 48 hours 11/19/20 0841 11/20/20 1325   11/19/20 1000  methenamine (MANDELAMINE) tablet 500 mg        500 mg Oral 2 times daily 11/18/20 2155     11/18/20 2200  aztreonam (AZACTAM) 0.5 g in dextrose 5 % 50 mL IVPB        0.5 g 100 mL/hr over 30 Minutes Intravenous Every 8 hours 11/18/20 2104     11/18/20 2115  vancomycin (VANCOREADY) IVPB 1500 mg/300 mL        1,500 mg 150 mL/hr over 120 Minutes Intravenous  Once 11/18/20 2104 11/18/20 2359        Subjective: Seen and examined at bedside and she is still coughing extensively.  No chest pain but was a little short of breath. Pleasantly confused. No nausea or vomiting. No other concerns or complaints at this time.   Objective: Vitals:   11/20/20 0732 11/20/20 1316 11/20/20 1503 11/20/20 1533  BP: (!) 133/58 118/68  (!) 139/57  Pulse: (!) 58 63  64  Resp: 20   18  Temp: 98.2 F (36.8  C) 98.4 F (36.9 C)  97.8 F (36.6 C)  TempSrc: Oral Oral  Oral  SpO2: 92% 92% 93% 97%  Weight:      Height:        Intake/Output Summary (Last 24 hours) at 11/20/2020 1819 Last data filed at 11/20/2020 1000 Gross per 24 hour  Intake 432.78 ml  Output 400 ml  Net 32.78 ml    Filed Weights   11/18/20 2000  Weight: 65.8 kg   Examination: Physical Exam:  Constitutional: WN/WD overweight pleasantly demented elderly Caucasian female  in NAD and appears calm sitting in the chair at bedside coughing  Eyes: Lids and conjunctivae normal, sclerae anicteric  ENMT: External Ears, Nose appear normal. Grossly normal hearing. Neck: Appears normal, supple, no cervical masses, normal ROM, no appreciable thyromegaly; no JVD Respiratory: Diminished to auscultation bilaterally with coarse breath sounds, no wheezing, rales, rhonchi or crackles. Normal respiratory effort and patient is not tachypenic. No accessory muscle use. Unlabored breathing Cardiovascular: RRR, no murmurs / rubs / gallops. S1 and S2 auscultated. Trace LE edema Abdomen: Soft, non-tender, Distended 2/2 to body habitus. Bowel sounds positive.  GU: Deferred. Musculoskeletal: No clubbing / cyanosis of digits/nails. No joint deformity upper and lower extremities.  Skin: No rashes, lesions, ulcers on a limited skin evaluation. No induration; Warm and dry.  Neurologic: CN 2-12 grossly intact with no focal deficits.  Romberg sign cerebellar reflexes not assessed.  Psychiatric: Impaired judgment and insight. Alert and oriented x 2. Normal mood and appropriate affect.   Data Reviewed: I have personally reviewed following labs and imaging studies  CBC: Recent Labs  Lab 11/18/20 1250 11/19/20 0304 11/20/20 0403  WBC 9.6 8.8 7.9  NEUTROABS 5.4  --  3.9  HGB 13.6 13.5 12.6  HCT 43.7 42.1 38.7  MCV 100.2* 99.1 96.3  PLT 165 146* 150    Basic Metabolic Panel: Recent Labs  Lab 11/18/20 1250 11/19/20 0304 11/20/20 0403  NA 138  140 137  K 4.3 4.0 3.7  CL 101 102 99  CO2 GLUCOSE 132* 123* 120*  BUN 25* 27* 35*  CREATININE 1.27* 1.30* 1.65*  CALCIUM 10.1 9.6 9.6  MG  --  1.9 1.9  PHOS  --   --  4.0    GFR: Estimated Creatinine Clearance: 21 mL/min (A) (by C-G formula based on SCr of 1.65 mg/dL (H)). Liver Function Tests: Recent Labs  Lab 11/18/20 1250 11/19/20 0304 11/20/20 0403  AST ALT ALKPHOS 68 61 61  BILITOT 0.8 1.0 0.7  PROT 8.3* 7.8 7.6  ALBUMIN 3.2* 2.9* 2.8*    No results for input(s): LIPASE, AMYLASE in the last 168 hours. No results for input(s): AMMONIA in the last 168 hours. Coagulation Profile: No results for input(s): INR, PROTIME in the last 168 hours. Cardiac Enzymes: No results for input(s): CKTOTAL, CKMB, CKMBINDEX, TROPONINI in the last 168 hours. BNP (last 3 results) No results for input(s): PROBNP in the last 8760 hours. HbA1C: No results for input(s): HGBA1C in the last 72 hours. CBG: Recent Labs  Lab 11/19/20 1639 11/19/20 2211 11/20/20 0641 11/20/20 1313 11/20/20 1739  GLUCAP 110* 110* 128* 143* 123*    Lipid Profile: No results for input(s): CHOL, HDL, LDLCALC, TRIG, CHOLHDL, LDLDIRECT in the last 72 hours. Thyroid Function Tests: No results for input(s): TSH, T4TOTAL, FREET4, T3FREE, THYROIDAB in the last 72 hours. Anemia Panel: No results for input(s): VITAMINB12, FOLATE, FERRITIN, TIBC, IRON, RETICCTPCT in the last 72 hours. Sepsis Labs: No results for input(s): PROCALCITON, LATICACIDVEN in the last 168 hours.  Recent Results (from the past 240 hour(s))  Resp Panel by RT-PCR (Flu A&B, Covid) Nasopharyngeal Swab     Status: None   Collection Time: 11/18/20  1:48 PM   Specimen: Nasopharyngeal Swab; Nasopharyngeal(NP) swabs in vial transport medium  Result Value Ref Range Status   SARS Coronavirus 2 by RT PCR NEGATIVE NEGATIVE Final    Comment: (NOTE) SARS-CoV-2 target nucleic acids are NOT DETECTED.  The SARS-CoV-2 RNA  is generally detectable in upper respiratory specimens during the acute phase of infection. The lowest concentration of SARS-CoV-2 viral copies this assay can detect is 138 copies/mL. A negative result does not preclude SARS-Cov-2 infection and should not be used as the sole basis for treatment or other patient management decisions. A negative result may occur with  improper specimen collection/handling, submission of specimen other than nasopharyngeal swab, presence of viral mutation(s) within the areas targeted by this assay, and inadequate number of viral copies(<138 copies/mL). A negative result must be combined with clinical observations, patient history, and epidemiological information. The expected result is Negative.  Fact Sheet for Patients:  BloggerCourse.com  Fact Sheet for Healthcare Providers:  SeriousBroker.it  This test is no t yet approved or cleared by the [place] FDA and  has been authorized for detection and/or diagnosis of SARS-CoV-2 by FDA under an Emergency Use Authorization (EUA). This EUA will remain  in effect (meaning this test can be used) for the duration of the COVID-19 declaration under Section 564(b)(1) of the Act, 21 U.S.C.section 360bbb-3(b)(1), unless the authorization is terminated  or revoked sooner.       Influenza A by PCR NEGATIVE NEGATIVE Final   Influenza B by PCR NEGATIVE NEGATIVE Final    Comment: (NOTE) The Xpert Xpress SARS-CoV-2/FLU/RSV plus assay is intended as an aid in the diagnosis of influenza from Nasopharyngeal swab specimens and should not be used as a sole basis for treatment. Nasal washings and aspirates are unacceptable for Xpert Xpress SARS-CoV-2/FLU/RSV testing.  Fact Sheet for Patients: BloggerCourse.com  Fact Sheet for Healthcare Providers: SeriousBroker.it  This test is not yet approved or cleared by the  [place] FDA and has been authorized for detection and/or diagnosis of SARS-CoV-2 by FDA under an Emergency Use Authorization (EUA). This EUA will remain in effect (meaning this test can be used) for the duration of the COVID-19 declaration under Section 564(b)(1) of the Act, 21 U.S.C. section 360bbb-3(b)(1), unless the authorization is terminated or revoked.  Performed at [facility], [address] [place], [place] [address]   Urine Culture     Status: Abnormal   Collection Time: 11/19/20 12:18 PM   Specimen: Urine, Clean Catch  Result Value Ref Range Status   Specimen Description URINE, CLEAN CATCH  Final   Special Requests NONE  Final   Culture (A)  Final    <10,000 COLONIES/mL INSIGNIFICANT GROWTH Performed at [facility], [address], [place], [place] [address]    Report Status 11/20/2020 FINAL  Final  Respiratory (~20 pathogens) panel by PCR     Status: None   Collection Time: 11/20/20 12:26 PM   Specimen: Nasopharyngeal Swab; Respiratory  Result Value Ref Range Status   Adenovirus NOT DETECTED NOT DETECTED Final   Coronavirus 229E NOT DETECTED NOT DETECTED Final    Comment: (NOTE) The Coronavirus on the Respiratory Panel, DOES NOT test for the novel  Coronavirus (2019 nCoV)    Coronavirus HKU1 NOT DETECTED NOT DETECTED Final   Coronavirus NL63 NOT DETECTED NOT DETECTED Final   Coronavirus OC43 NOT DETECTED NOT DETECTED Final   Metapneumovirus NOT DETECTED NOT DETECTED Final   Rhinovirus / Enterovirus NOT DETECTED NOT DETECTED Final   Influenza A NOT DETECTED NOT DETECTED Final   Influenza B NOT DETECTED NOT DETECTED Final   Parainfluenza Virus 1 NOT DETECTED NOT DETECTED Final   Parainfluenza Virus 2 NOT DETECTED NOT DETECTED Final   Parainfluenza Virus 3 NOT DETECTED NOT DETECTED  Final   Parainfluenza Virus 4 NOT DETECTED NOT DETECTED Final   Respiratory Syncytial Virus NOT DETECTED NOT DETECTED Final   Bordetella pertussis NOT DETECTED NOT  DETECTED Final   Bordetella Parapertussis NOT DETECTED NOT DETECTED Final   Chlamydophila pneumoniae NOT DETECTED NOT DETECTED Final   Mycoplasma pneumoniae NOT DETECTED NOT DETECTED Final    Comment: Performed at [facility], [address] [place], [place] [address]     RN Pressure Injury Documentation:     Estimated body mass index is 26.52 kg/m as calculated from the following:   Height as of this encounter: 5\' 2"  (1.575 m).   Weight as of this encounter: 65.8 kg.  Malnutrition Type:  Malnutrition Characteristics:   Nutrition Interventions:    Radiology Studies: DG CHEST PORT 1 VIEW  Result Date: 11/20/2020 CLINICAL DATA:  Short of breath.  History of CHF. EXAM: PORTABLE CHEST 1 VIEW COMPARISON:  Chest x-ray and CT chest 11/18/2020 FINDINGS: Postop CABG. Negative for heart failure or edema. Prominent lung markings most consistent with chronic lung disease. Mild left lower lobe atelectasis or scarring. Negative for pneumonia or effusion. Degenerative change in both shoulders. IMPRESSION: Negative for heart failure or pneumonia. Mild left lower lobe atelectasis. Suspect underlying chronic lung disease. Electronically Signed   By: Marlan Palau M.D.   On: 11/20/2020 08:40    Scheduled Meds:  calcium-vitamin D  1 tablet Oral BID WC   dabigatran  75 mg Oral Q12H   diltiazem  180 mg Oral QHS   docusate sodium  100 mg Oral BID   insulin aspart  0-9 Units Subcutaneous TID WC   levETIRAcetam  250 mg Oral QHS   levothyroxine  75 mcg Oral QAC breakfast   memantine  10 mg Oral Daily   methenamine  500 mg Oral BID   metoprolol succinate  25 mg Oral Daily   mirtazapine  7.5 mg Oral QHS   pantoprazole  40 mg Oral Daily   polyvinyl alcohol  1 drop Both Eyes QID   pravastatin  40 mg Oral QHS   sodium chloride flush  3 mL Intravenous Q12H   torsemide  50 mg Oral Daily   Continuous Infusions:  aztreonam 0.5 g (11/20/20 0700)    LOS: 1 day   Merlene Laughter, DO [facility] PAGER is on AMION  If 7PM-7AM, please contact night-coverage [email or web address]

## 2020-11-20 NOTE — TOC Initial Note (Signed)
Transition of Care [facility]) - Initial/Assessment Note    Patient Details  Name: [name] MRN: [id number] Date of Birth: 1929-10-19  Transition of Care [facility]) CM/SW Contact:    Baldemar Lenis, LCSW Phone Number: 11/20/2020, 2:46 PM  Clinical Narrative:         CSW spoke with patient's daughter, [name], who indicated that they have been trying to move the patient into long term care at [facility] but there haven't been beds. CSW reached out to [facility] and spoke with Elease Hashimoto, there are still no long term beds available. CSW attempted to reach [place] to update, and left a voicemail. Patient is long term at [facility] and will be able to return at discharge. CSW to follow.          Expected Discharge Plan: Skilled Nursing Facility Barriers to Discharge: Continued Medical Work up   Patient Goals and CMS Choice Patient states their goals for this hospitalization and ongoing recovery are:: patient unable to participate in goal setting, only oriented to self CMS Medicare.gov Compare Post Acute Care list provided to:: Patient Represenative (must comment) Choice offered to / list presented to : Adult Children  Expected Discharge Plan and Services Expected Discharge Plan: Skilled Nursing Facility     Post Acute Care Choice: Skilled Nursing Facility Living arrangements for the past 2 months: Skilled Nursing Facility                                      Prior Living Arrangements/Services Living arrangements for the past 2 months: Skilled Nursing Facility Lives with:: Facility Resident Patient language and need for interpreter reviewed:: No Do you feel safe going back to the place where you live?: Yes      Need for Family Participation in Patient Care: Yes (Comment) Care giver support system in place?: Yes (comment)   Criminal Activity/Legal Involvement Pertinent to Current Situation/Hospitalization: No - Comment as needed  Activities of Daily Living Home  Assistive Devices/Equipment: Eyeglasses, Environmental consultant (specify type), Bedside commode/3-in-1 ADL Screening (condition at time of admission) Patient's cognitive ability adequate to safely complete daily activities?: No Is the patient deaf or have difficulty hearing?: No Does the patient have difficulty seeing, even when wearing glasses/contacts?: No Does the patient have difficulty concentrating, remembering, or making decisions?: Yes Patient able to express need for assistance with ADLs?: Yes Does the patient have difficulty dressing or bathing?: Yes Independently performs ADLs?: No Does the patient have difficulty walking or climbing stairs?: Yes Weakness of Legs: Both Weakness of Arms/Hands: Both  Permission Sought/Granted Permission sought to share information with : Facility Medical sales representative, Family Supports    Share Information with NAME: [name]  Permission granted to share info w AGENCY: [facility], [employer]  Permission granted to share info w Relationship: Daughter     Emotional Assessment   Attitude/Demeanor/Rapport: Unable to Assess Affect (typically observed): Unable to Assess Orientation: : Oriented to Self Alcohol / Substance Use: Not Applicable Psych Involvement: No (comment)  Admission diagnosis:  Acute cystitis without hematuria [N30.00] Altered mental status, unspecified altered mental status type [R41.82] Acute metabolic encephalopathy [G93.41] Patient Active Problem List   Diagnosis Date Noted   Acute metabolic encephalopathy 11/18/2020   AMS (altered mental status) 07/29/2020   Sinusitis 07/29/2020   TIA (transient ischemic attack) 05/13/2020   Vascular dementia, uncomplicated (HCC) 05/13/2020   AKI (acute kidney injury) (HCC) 01/17/2018   MDD (major  depressive disorder) 01/15/2018   Macrocytic anemia 09/22/2017   Proteinuria 04/06/2017   Insomnia 12/01/2016   CAD (coronary artery disease) 11/17/2016   Hyperlipidemia 11/17/2016   Hypertension  11/17/2016   Aortic atherosclerosis (HCC) 11/03/2016   CKD (chronic kidney disease) stage 3, GFR 30-59 ml/min (HCC) 04/14/2015   Colonic constipation 04/14/2015   Frequent UTI 04/01/2015   Diabetes mellitus with renal complications (HCC) 03/03/2015   Atrophic vaginitis 02/24/2015   Rheumatoid arthritis (HCC) 02/18/2015   DNR (do not resuscitate) 02/13/2015   Occlusion and stenosis of carotid artery without mention of cerebral infarction 05/02/2013   Mesenteric artery insufficiency (HCC) 02/14/2013   FEMORAL BRUIT 11/24/2009   Hypothyroidism 06/26/2009   CEREBROVASCULAR DISEASE 06/25/2009   ATRIAL FIBRILLATION 05/19/2008   Chronic diastolic heart failure (HCC) 05/19/2008   RENAL ARTERY STENOSIS 05/19/2008   PERIPHERAL VASCULAR DISEASE 05/19/2008   PCP:  [name], MD Pharmacy:   [facility] - [place], [place] - [address] [place] [address] Phone: [phone] Fax: [phone]     Social Determinants of Health (SDOH) Interventions    Readmission Risk Interventions No flowsheet data found.

## 2020-11-20 NOTE — NC FL2 (Signed)
[place] MEDICAID FL2 LEVEL OF CARE SCREENING TOOL     IDENTIFICATION  Patient Name: [name] Birthdate: 05-Dec-1929 Sex: female Admission Date (Current Location): 11/18/2020  [facility] and [place] Number:  Producer, television/film/video and Address:  [facility], [address] [place], [place] [address]      Provider Number: [id number]  Attending Physician Name and Address:  Merlene Laughter, DO  Relative Name and Phone Number:       Current Level of Care: Hospital Recommended Level of Care: Skilled Nursing Facility Prior Approval Number:    Date Approved/Denied:   PASRR Number:    Discharge Plan: SNF    Current Diagnoses: Patient Active Problem List   Diagnosis Date Noted   Acute metabolic encephalopathy 11/18/2020   AMS (altered mental status) 07/29/2020   Sinusitis 07/29/2020   TIA (transient ischemic attack) 05/13/2020   Vascular dementia, uncomplicated (HCC) 05/13/2020   AKI (acute kidney injury) (HCC) 01/17/2018   MDD (major depressive disorder) 01/15/2018   Macrocytic anemia 09/22/2017   Proteinuria 04/06/2017   Insomnia 12/01/2016   CAD (coronary artery disease) 11/17/2016   Hyperlipidemia 11/17/2016   Hypertension 11/17/2016   Aortic atherosclerosis (HCC) 11/03/2016   CKD (chronic kidney disease) stage 3, GFR 30-59 ml/min (HCC) 04/14/2015   Colonic constipation 04/14/2015   Frequent UTI 04/01/2015   Diabetes mellitus with renal complications (HCC) 03/03/2015   Atrophic vaginitis 02/24/2015   Rheumatoid arthritis (HCC) 02/18/2015   DNR (do not resuscitate) 02/13/2015   Occlusion and stenosis of carotid artery without mention of cerebral infarction 05/02/2013   Mesenteric artery insufficiency (HCC) 02/14/2013   FEMORAL BRUIT 11/24/2009   Hypothyroidism 06/26/2009   CEREBROVASCULAR DISEASE 06/25/2009   ATRIAL FIBRILLATION 05/19/2008   Chronic diastolic heart failure (HCC) 05/19/2008   RENAL ARTERY STENOSIS 05/19/2008    PERIPHERAL VASCULAR DISEASE 05/19/2008    Orientation RESPIRATION BLADDER Height & Weight     Self  Normal Incontinent Weight: 145 lb (65.8 kg) (self reported) Height:  5\' 2"  (157.5 cm) (self reported)  BEHAVIORAL SYMPTOMS/MOOD NEUROLOGICAL BOWEL NUTRITION STATUS    Convulsions/Seizures Continent Diet (heart healthy, carb modified)  AMBULATORY STATUS COMMUNICATION OF NEEDS Skin   Limited Assist Verbally Normal                       Personal Care Assistance Level of Assistance  Bathing, Feeding, Dressing Bathing Assistance: Limited assistance Feeding assistance: Limited assistance Dressing Assistance: Limited assistance     Functional Limitations Info  Sight Sight Info: Impaired        SPECIAL CARE FACTORS FREQUENCY                       Contractures Contractures Info: Not present    Additional Factors Info  Code Status, Allergies, Insulin Sliding Scale Code Status Info: DNR Allergies Info: Cefuroxime, Fish Allergy, Ciprofloxacin, Codeine, Sertraline Hcl, Cephalexin, Paxil (Paroxetine Hcl)   Insulin Sliding Scale Info: see DC summary       Current Medications (11/20/2020):  This is the current hospital active medication list Current Facility-Administered Medications  Medication Dose Route Frequency Provider Last Rate Last Admin   acetaminophen (TYLENOL) tablet 650 mg  650 mg Oral Q6H PRN 01/20/2021, MD       Or   acetaminophen (TYLENOL) suppository 650 mg  650 mg Rectal Q6H PRN Synetta Fail, MD       aztreonam (AZACTAM) 0.5 g in dextrose  5 % 50 mL IVPB  0.5 g Intravenous Q8H Jamal Collin, RPH 100 mL/hr at 11/20/20 0700 0.5 g at 11/20/20 0700   barrier cream (non-specified) 1 application  1 application Topical BID PRN Marguerita Merles Latif, DO       benzonatate (TESSALON) capsule 100 mg  100 mg Oral TID PRN Marguerita Merles Latif, DO       calcium-vitamin D (OSCAL WITH D) 500-200 MG-UNIT per tablet 1 tablet  1 tablet Oral BID WC Synetta Fail, MD   1 tablet at 11/20/20 9833   dabigatran (PRADAXA) capsule 75 mg  75 mg Oral Q12H Jamal Collin, RPH   75 mg at 11/20/20 1011   diltiazem (CARDIZEM CD) 24 hr capsule 180 mg  180 mg Oral QHS Synetta Fail, MD   180 mg at 11/19/20 2137   docusate sodium (COLACE) capsule 100 mg  100 mg Oral BID Synetta Fail, MD   100 mg at 11/20/20 1011   insulin aspart (novoLOG) injection 0-9 Units  0-9 Units Subcutaneous TID WC Synetta Fail, MD   1 Units at 11/20/20 1232   ipratropium (ATROVENT) nebulizer solution 0.5 mg  0.5 mg Nebulization Q6H Sheikh, Omair Latif, DO       levalbuterol [facility]) nebulizer solution 0.63 mg  0.63 mg Nebulization Q6H Sheikh, Omair [place], DO       levETIRAcetam (KEPPRA) tablet 250 mg  250 mg Oral QHS Synetta Fail, MD   250 mg at 11/19/20 2135   levothyroxine (SYNTHROID) tablet 75 mcg  75 mcg Oral QAC breakfast Synetta Fail, MD   75 mcg at 11/20/20 8250   loperamide (IMODIUM) capsule 4 mg  4 mg Oral Daily PRN Synetta Fail, MD       memantine [facility]) tablet 10 mg  10 mg Oral Daily Synetta Fail, MD   10 mg at 11/20/20 1012   methenamine (MANDELAMINE) tablet 500 mg  500 mg Oral BID Synetta Fail, MD   500 mg at 11/20/20 1013   metoprolol succinate (TOPROL-XL) 24 hr tablet 25 mg  25 mg Oral Daily Synetta Fail, MD   25 mg at 11/20/20 1012   mirtazapine (REMERON) tablet 7.5 mg  7.5 mg Oral QHS Synetta Fail, MD   7.5 mg at 11/19/20 2135   pantoprazole (PROTONIX) EC tablet 40 mg  40 mg Oral Daily Synetta Fail, MD   40 mg at 11/20/20 1015   polyethylene glycol (MIRALAX / GLYCOLAX) packet 17 g  17 g Oral Daily PRN Synetta Fail, MD       polyvinyl alcohol (LIQUIFILM TEARS) 1.4 % ophthalmic solution 1 drop  1 drop Both Eyes QID Synetta Fail, MD   1 drop at 11/20/20 1014   pravastatin (PRAVACHOL) tablet 40 mg  40 mg Oral QHS Synetta Fail, MD   40 mg at 11/19/20 2135   sodium  chloride flush (NS) 0.9 % injection 3 mL  3 mL Intravenous Q12H Synetta Fail, MD   3 mL at 11/20/20 1014   torsemide (DEMADEX) tablet 50 mg  50 mg Oral Daily Marguerita Merles Latif, DO   50 mg at 11/20/20 1012     Discharge Medications: Please see discharge summary for a list of discharge medications.  Relevant Imaging Results:  Relevant Lab Results:   Additional Information SS#: [id number]  Baldemar Lenis, LCSW

## 2020-11-21 ENCOUNTER — Inpatient Hospital Stay (HOSPITAL_COMMUNITY): Payer: Medicare PPO

## 2020-11-21 ENCOUNTER — Encounter (HOSPITAL_COMMUNITY): Payer: Medicare PPO

## 2020-11-21 LAB — CBC WITH DIFFERENTIAL/PLATELET
Abs Immature Granulocytes: 0.03 10*3/uL (ref 0.00–0.07)
Basophils Absolute: 0 10*3/uL (ref 0.0–0.1)
Basophils Relative: 1 %
Eosinophils Absolute: 0.5 10*3/uL (ref 0.0–0.5)
Eosinophils Relative: 6 %
HCT: 35.3 % — ABNORMAL LOW (ref 36.0–46.0)
Hemoglobin: 12.1 g/dL (ref 12.0–15.0)
Immature Granulocytes: 0 %
Lymphocytes Relative: 25 %
Lymphs Abs: 2 10*3/uL (ref 0.7–4.0)
MCH: 32.1 pg (ref 26.0–34.0)
MCHC: 34.3 g/dL (ref 30.0–36.0)
MCV: 93.6 fL (ref 80.0–100.0)
Monocytes Absolute: 0.7 10*3/uL (ref 0.1–1.0)
Monocytes Relative: 8 %
Neutro Abs: 4.9 10*3/uL (ref 1.7–7.7)
Neutrophils Relative %: 60 %
Platelets: 163 10*3/uL (ref 150–400)
RBC: 3.77 MIL/uL — ABNORMAL LOW (ref 3.87–5.11)
RDW: 14.2 % (ref 11.5–15.5)
WBC: 8.1 10*3/uL (ref 4.0–10.5)
nRBC: 0 % (ref 0.0–0.2)

## 2020-11-21 LAB — COMPREHENSIVE METABOLIC PANEL
ALT: 6 U/L (ref 0–44)
AST: 18 U/L (ref 15–41)
Albumin: 2.7 g/dL — ABNORMAL LOW (ref 3.5–5.0)
Alkaline Phosphatase: 59 U/L (ref 38–126)
Anion gap: 11 (ref 5–15)
BUN: 45 mg/dL — ABNORMAL HIGH (ref 8–23)
CO2: 25 mmol/L (ref 22–32)
Calcium: 9.4 mg/dL (ref 8.9–10.3)
Chloride: 100 mmol/L (ref 98–111)
Creatinine, Ser: 1.75 mg/dL — ABNORMAL HIGH (ref 0.44–1.00)
GFR, Estimated: 28 mL/min — ABNORMAL LOW (ref >60)
Glucose, Bld: 119 mg/dL — ABNORMAL HIGH (ref 70–99)
Potassium: 3.9 mmol/L (ref 3.5–5.1)
Sodium: 136 mmol/L (ref 135–145)
Total Bilirubin: 1 mg/dL (ref 0.3–1.2)
Total Protein: 7.1 g/dL (ref 6.5–8.1)

## 2020-11-21 LAB — GLUCOSE, CAPILLARY
Glucose-Capillary: 125 mg/dL — ABNORMAL HIGH (ref 70–99)
Glucose-Capillary: 161 mg/dL — ABNORMAL HIGH (ref 70–99)
Glucose-Capillary: 163 mg/dL — ABNORMAL HIGH (ref 70–99)
Glucose-Capillary: 134 mg/dL — ABNORMAL HIGH (ref 70–99)

## 2020-11-21 LAB — PHOSPHORUS: Phosphorus: 3.7 mg/dL (ref 2.5–4.6)

## 2020-11-21 LAB — MAGNESIUM: Magnesium: 1.9 mg/dL (ref 1.7–2.4)

## 2020-11-21 IMAGING — DX DG CHEST 1V PORT
1 series · 1 of 1 positions shown · non-contrast
Comparison: [DATE]

CLINICAL DATA: Shortness of breath. Evaluate atelectasis or
scarring. Pneumonia or effusion.

EXAM:
PORTABLE CHEST 1 VIEW

[chest ap]
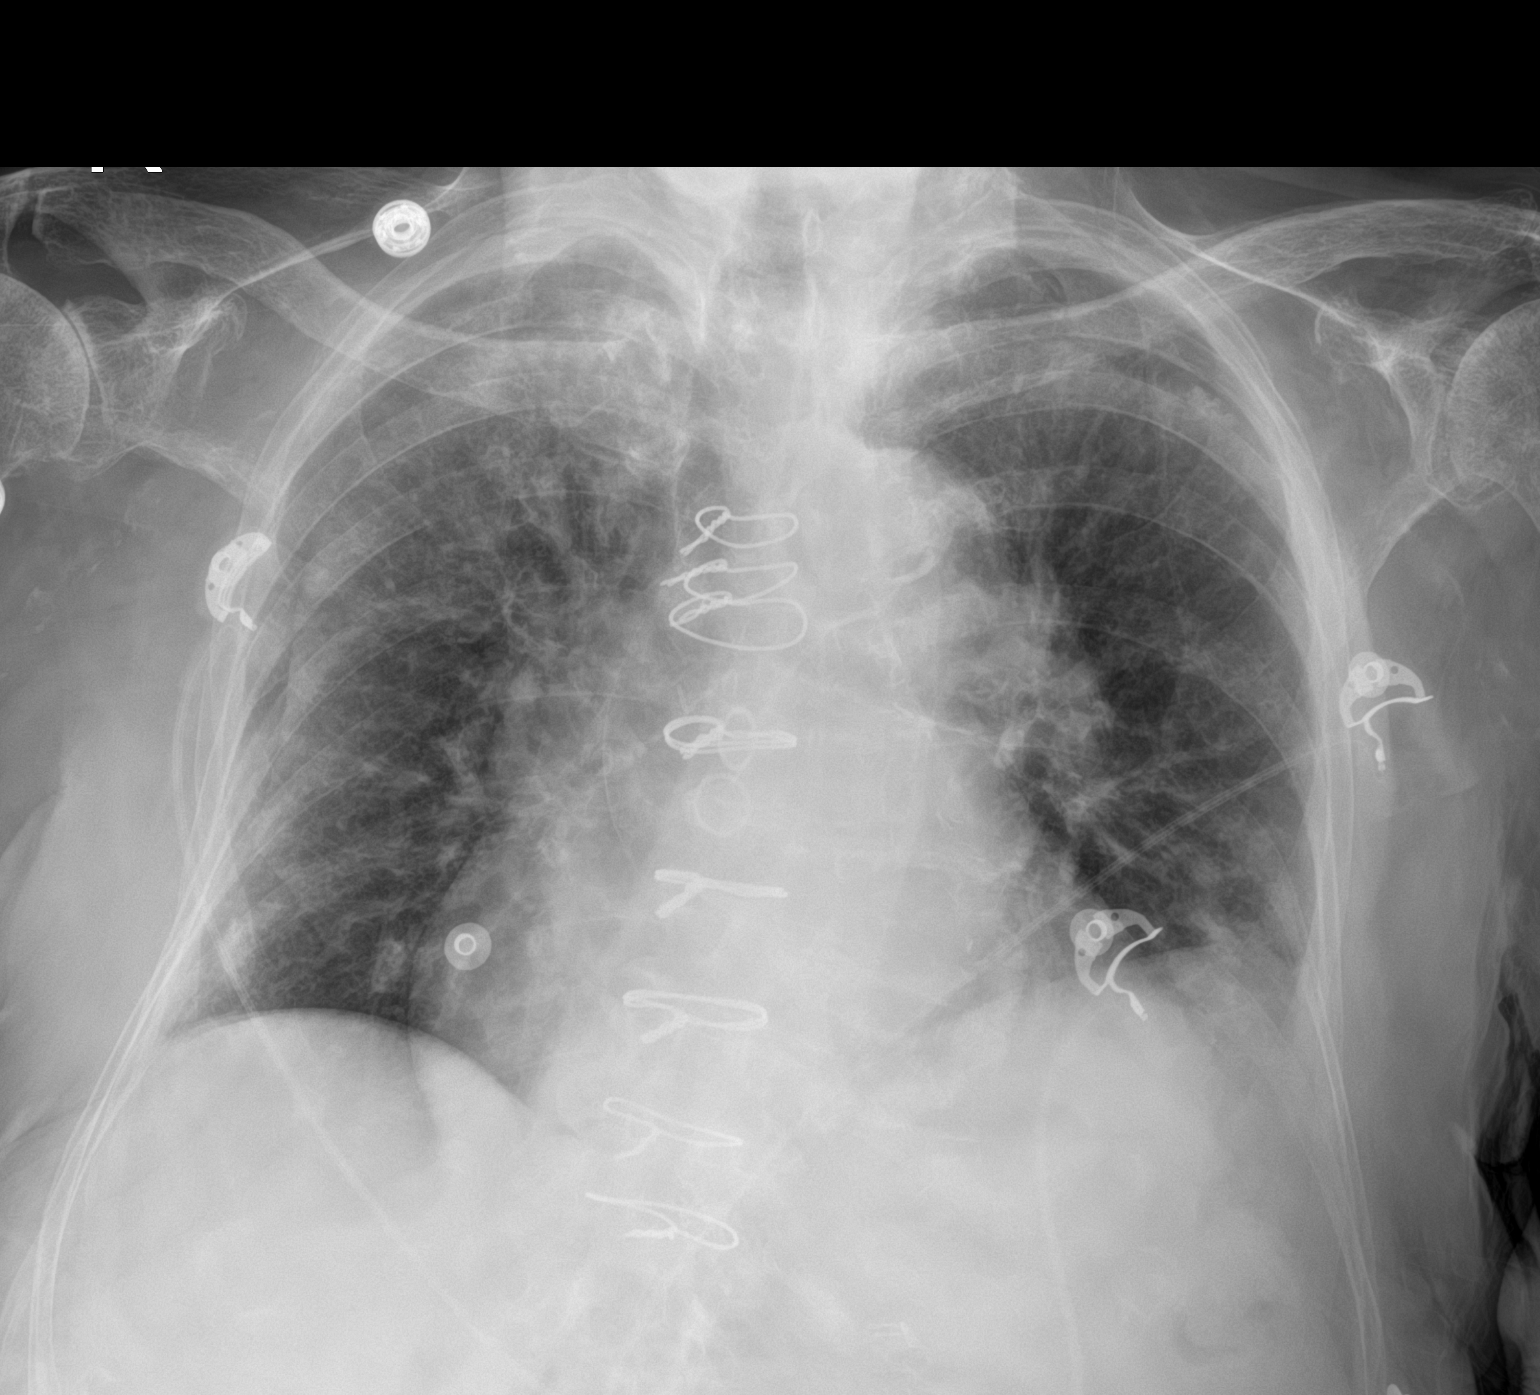

[1 of 1 positions shown; findings below may reference images not displayed]

FINDINGS: Cardiomegaly. The hila and mediastinum are unchanged. No
pneumothorax. Bilateral pulmonary opacities the large interstitial
component but more focal opacities in the bases.
IMPRESSION: 1. Bilateral pulmonary opacities. The opacities are nonspecific but
an infectious process is favored due to the more focal opacities in
the bases. Recommend attention on follow-up.

## 2020-11-21 MED ORDER — DOXYCYCLINE HYCLATE 100 MG PO TABS
100.0000 mg | ORAL_TABLET | Freq: Two times a day (BID) | ORAL | Status: DC
  Administered 2020-11-21 – 2020-11-23 (×4): 100 mg via ORAL
  Filled 2020-11-21 (×4): qty 1

## 2020-11-21 NOTE — Progress Notes (Addendum)
PROGRESS NOTE    [name]  GMW:[id number] DOB: [birth year] DOA: 11/18/2020 PCP: [name], MD   Brief Narrative:  The patient is an 87 year old overweight Caucasian female with a past medical history significant for but not limited to dementia, hypothyroidism, CKD stage IIIb, hypertension, hyperlipidemia, diabetes mellitus type 2, atrial fibrillation, history of chronic diastolic CHF, CAD status post CABG, history of DVT, rheumatoid arthritis, history of recurrent UTIs, history of CVA who presented with some increased confusion, hypoxia and cough.  Patient is a poor historian so most of the history is obtained from the chart review and family and she is admitted from [facility] with increased confusion, hypoxia at the facility and ongoing cough.  She has a history of recurrent UTIs and has had a single dose of fosfomycin on 11/10/2020.  At her facility she was reportedly have a saturation around 70% and was 85 in the EMS and improved with 2 L in route.  In the ED her vital signs were significant for elevated respiratory rate.  Lab work-up showed a BUN/creatinine of 25/1.27.  Troponins were flat but she did have elevated BNP that was elevated to 400.  Urinalysis showed leukocytes and white cells and bacteria and chest x-ray with no acute abnormality.  CTA PE done showed no PE but there was cardiomegaly and trace pulmonary edema and age-indeterminate T6 compression fracture with recommendation for MRI and follow-up.  Antibiotics per pharmacy for UTI were ordered in the ED due to her advanced allergies and recent treatment and will de-escalate and stop IV vancomycin.  Patient continues to cough but chest x-ray today was unrevealing and did not show any evidence of active heart failure or pneumonia.  Likely she has a bronchitis so we we will obtain a respiratory virus panel and start her on a flutter valve, incentive spirometer as well as Xopenex and Atrovent.  She continued to complain about some  intermittent abdominal pain so we will obtain a mesenteric ultrasound to evaluate her celiac stent and this is still pending to be done and today it was a technically difficult study so will be reattempted Monday.  Her wheezing seems to be improving with the Xopenex and Atrovent.  She feels okay today.  Respiratory virus panel was negative so would discontinue droplet precautions   Assessment & Plan:   Principal Problem:   Acute metabolic encephalopathy Active Problems:   Hypothyroidism   ATRIAL FIBRILLATION   Chronic diastolic heart failure (HCC)   CEREBROVASCULAR DISEASE   PERIPHERAL VASCULAR DISEASE   DNR (do not resuscitate)   Rheumatoid arthritis (HCC)   Diabetes mellitus with renal complications (HCC)   Frequent UTI   CKD (chronic kidney disease) stage 3, GFR 30-59 ml/min (HCC)   CAD (coronary artery disease)   Hyperlipidemia   Hypertension   Vascular dementia, uncomplicated (HCC)    Acute Encephalopathy in the setting of UTI and Hypoxia  UTI Recurrent UTI -Metabolic encephalopathy suspected early due to UTI. -Has history of recurrent UTI had previously been on methenamine. > Recently treated for UTI about a week ago with fosfomycin.  Has presented similarly with UTI in the past.  Does have leukocytes, white cells and bacteria in urine. -Pharmacy consulted for antibiotics in the ED due to patient's recent antibiotics as well as allergies. - No fever or leukocytosis in the ED.  Urine studies ordered unfortunately urine culture was not sent until after she had received antibiotics -Urinalysis showed straw-colored urine with negative glucose, negative hemoglobin, negative ketones, large leukocytes,  rare bacteria, 0-5 squamous epithelial cells and greater than 50 WBCs; Repeat Cx showed <10,000 CFU  -Continue with antibiotics per pharmacy and will stop IV Vanc and now stop IV and change and then changed to p.o. doxycycline -Trend fever curve and white count -Continue with PT OT  and they are recommending SNF -She will need to see Urology in the outpatient settings to discuss with them about Recurrent UTI's    Acute Respiratory Failure with Hypoxia volume overload likely from acute on chronic diastolic CHF versus Bilateral Basilar PNA vs Bronchitis  >Initially required 2 L in the ED.   -However she has been weaned off this in the ED without significant intervention.   -SpO2: 95 % O2 Flow Rate (L/min): 2 L/min -Continuous pulse oximetry maintain O2 saturations greater than 90% -Continue supplemental oxygen via nasal cannula and wean to room air -Continue monitor respiratory status carefully -Check Respiratory Virus Panel and place on Droplet but this was negative so we will discontinue droplet precautions -C/w Xopenex/Atrovent -C/w Doxycycline as above  -Flutter Valve and Incentive Spirometry  -We will continue to monitor and consider diuresis if recurs as below. -We will need an ambulatory home O2 screen prior to discharge and repeat chest x-ray in a.m.; chest x-ray yesterday a.m. did not show evidence of heart failure or pneumonia but today showed "Bilateral pulmonary opacities. The opacities are nonspecific but an infectious process is favored due to the more focal opacities in the bases. Recommend attention on follow-up."   Acute on Chronic Diastolic CHF, improved > History of diastolic heart failure.   -Last Echo in our system on February of this year with EF 55-60%, hypertrophy with indeterminate diastolic parameters as well as normal RV function > Mildly elevated BNP to 400.  No significant edema CT scan showed some volume overload.  -Hypoxia as above has resolved in the ED. rales noted on exam. - Low dose IV lasix and given 20 mg x 1 -Strict I's and O's and daily weights; she is -1467.2 mL -Continue home Torsemide for now but may hold if renal function continues to worsen -Continue to monitor for signs and symptoms of volume overload -Repeat chest x-ray in  the a.m. and will need ambulatory home O2 screen prior to discharge   Dementia -Continue home Namenda  ?Abdominal Pain -Patient has Dementia and on exam she appeared to be nontender did not grimace but could not tell me if she was having abdominal pain or not -Discussed with Dr. About obtaining vascular imaging given her mesenteric artery and celiac stenosis and stenting getting a mesenteric ultrasound while she is in the hospital and this is still pending to be done   T6 Compression Fracture  > CT scan showed signs of age-indeterminate compression fracture.  Patient does have some pain there -Recommending MRI for follow-up and I spoke to the patient daughter about this and we also consulted IR.  IR would like an MRI before possible KP or VP but patient's Daughter to decide whether she wants to pursue this or Not and unfortunately daughter did not pick up the phone to discuss  -Obtain PT and OT   Hypothyroidism -Check TSH in the AM  -Continue home Synthroid  GERD -Continue PPI with pantoprazole 40 mg po Daily    CKD 3B > Creatinine stable in the ED. BUNs/creatinine is now 27/1.30 -> 35/1.65 -> 45/1.75 - Avoid nephrotoxic agents, contrast dyes, hypotension renally dose medications - Trend renal function and electrolytes - Repeat CMP in the  AM    Hypertension > Blood pressure stable in the ED - Continue home diltiazem, metoprolol -Continue monitor blood pressures per protocol -Last BP was 139/55   Diabetes - SSI -CBGs ranging from 123-163   A. Fib -Continue home Diltiazem, metoprolol, Pradaxa -Continue to Monitor on Telemetry   CAD status post CABG - Continue home metoprolol, statin   History of DVT -On chronic Pradaxa as above.   Rheumatoid Arthritis  -Had previously been on methotrexate however is not currently taking this.   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 Wilford Corner "If there is significant concern for drowsiness with Keppra, change it to once every other day for a week and discontinue.  Please follow-up with your outpatient neurologist in the next 2 weeks.  Maintain seizure precautions" -Will discuss Neurology Recc's with family   DVT prophylaxis: Anticoagulated with Pradaxa Code Status: FULL CODE  Family Communication: Called Daughter [name] at 18:42 to discuss case but she did not answer  Disposition Plan: Will need PT OT further evaluation recommendations and they are recommending SNF  Status is: Inpatient  Remains inpatient appropriate because:Unsafe d/c plan, IV treatments appropriate due to intensity of illness or inability to take PO, and Inpatient level of care appropriate due to severity of illness  Dispo: The patient is from: Home              Anticipated d/c is to:  TBD              Patient currently is not medically stable to d/c.   Difficult to place patient No   Consultants:  Discussed case with Neurology Discussed case with Vascular  Procedures:   Antimicrobials:  Anti-infectives (From admission, onward)    Start     Dose/Rate Route Frequency Ordered Stop   11/21/20 2200  doxycycline (VIBRA-TABS) tablet 100 mg        100 mg Oral Every 12 hours 11/21/20 1839     11/20/20 2200  vancomycin (VANCOREADY) IVPB 1250 mg/250 mL  Status:  Discontinued        1,250 mg 166.7 mL/hr over 90 Minutes Intravenous Every 48 hours 11/18/20 2129 11/19/20 0841   11/20/20 2200  vancomycin (VANCOCIN) IVPB 1000 mg/200 mL premix  Status:  Discontinued        1,000 mg 200 mL/hr over 60 Minutes  Intravenous Every 48 hours 11/19/20 0841 11/20/20 1325   11/19/20 1000  methenamine (MANDELAMINE) tablet 500 mg        500 mg Oral 2 times daily 11/18/20 2155     11/18/20 2200  aztreonam (AZACTAM) 0.5 g in dextrose 5 % 50 mL IVPB  Status:  Discontinued        0.5 g 100 mL/hr over 30 Minutes Intravenous Every 8 hours 11/18/20 2104 11/21/20 1839   11/18/20 2115  vancomycin (VANCOREADY) IVPB 1500 mg/300 mL        1,500 mg 150 mL/hr over 120 Minutes Intravenous  Once 11/18/20 2104 11/18/20 2359        Subjective: Seen and examined at bedside  she is not coughing as much.  Denied any shortness of breath.  Sitting up in bed and felt okay.  No chest pain or shortness of breath.  Denies any other concerns or plans at this time.  No family currently at bedside.  Objective: Vitals:   11/21/20 0300 11/21/20 0811 11/21/20 1146 11/21/20 1521  BP: 128/70 (!) 132/58 128/73 (!) 139/55  Pulse: 84 67 86 75  Resp: 18 18 18 20   Temp: 98 F (36.7 C) 99.5 F (37.5 C) 98.7 F (37.1 C) 99.1 F (37.3 C)  TempSrc: Oral Oral Oral Oral  SpO2: 96% (!) 89% (!) 89% 95%  Weight:      Height:       No intake or output data in the 24 hours ending 11/21/20 1840  Filed Weights   11/18/20 2000  Weight: 65.8 kg   Examination: Physical Exam:  Constitutional: WN/WD awake pleasantly demented Caucasian female currently no acute distress appears calm sitting in the bed and no longer coughing today. Eyes: Lids and conjunctivae normal, sclerae anicteric  ENMT: External Ears, Nose appear normal. Grossly normal hearing.  Neck: Appears normal, supple, no cervical masses, normal ROM, no appreciable thyromegaly; no JVD Respiratory: Diminished to auscultation bilaterally with coarse breath sounds and no longer wheezing currently, no appreciable rales, rhonchi or crackles. Normal respiratory effort and patient is not tachypenic. No accessory muscle use.  Cardiovascular: RRR, no murmurs / rubs / gallops. S1 and S2  auscultated.  Very minimal extremity edema Abdomen: Soft, non-tender, distended secondary body habitus.  Bowel sounds positive.  GU: Deferred. Musculoskeletal: No clubbing / cyanosis of digits/nails. No joint deformity upper and lower extremities.  Skin: No rashes, lesions, ulcers on limited skin evaluation. No induration; Warm and dry.  Neurologic: CN 2-12 grossly intact with no focal deficits. Romberg sign cerebellar reflexes not assessed.  Psychiatric: Mildly impaired judgment and insight. Alert and oriented x 2. Normal mood and appropriate affect.   Data Reviewed: I have personally reviewed following labs and imaging studies  CBC: Recent Labs  Lab 11/18/20 1250 11/19/20 0304 11/20/20 0403 11/21/20 0425  WBC 9.6 8.8 7.9 8.1  NEUTROABS 5.4  --  3.9 4.9  HGB 13.6 13.5 12.6 12.1  HCT 43.7 42.1 38.7 35.3*  MCV 100.2* 99.1 96.3 93.6  PLT 165 146* 150 163    Basic Metabolic Panel: Recent Labs  Lab 11/18/20 1250 11/19/20 0304 11/20/20 0403 11/21/20 0425  NA 138 140 137 136  K 4.3 4.0 3.7 3.9  CL 101 102 99 100  CO2 27 28 27 25   GLUCOSE 132* 123* 120* 119*  BUN 25* 27* 35* 45*  CREATININE 1.27* 1.30* 1.65* 1.75*  CALCIUM 10.1 9.6 9.6 9.4  MG  --  1.9 1.9 1.9  PHOS  --   --  4.0 3.7    GFR: Estimated Creatinine Clearance: 19.8 mL/min (A) (by C-G formula based on SCr of 1.75 mg/dL (H)). Liver Function Tests: Recent Labs  Lab 11/18/20 1250 11/19/20 0304 11/20/20 0403 11/21/20 0425  AST 17 16 15 18   ALT 11 10 8 6   ALKPHOS 68 61 61 59  BILITOT 0.8 1.0 0.7 1.0  PROT 8.3* 7.8 7.6 7.1  ALBUMIN 3.2* 2.9* 2.8* 2.7*    No results for input(s): LIPASE, AMYLASE in the last 168 hours. No results for input(s): AMMONIA in the last 168 hours. Coagulation Profile: No results for input(s): INR, PROTIME in the last 168 hours. Cardiac Enzymes: No results for input(s): CKTOTAL, CKMB, CKMBINDEX, TROPONINI  in the last 168 hours. BNP (last 3 results) No results for input(s):  PROBNP in the last 8760 hours. HbA1C: No results for input(s): HGBA1C in the last 72 hours. CBG: Recent Labs  Lab 11/20/20 1739 11/20/20 2155 11/21/20 0701 11/21/20 1148 11/21/20 1522  GLUCAP 123* 140* 125* 161* 163*    Lipid Profile: No results for input(s): CHOL, HDL, LDLCALC, TRIG, CHOLHDL, LDLDIRECT in the last 72 hours. Thyroid Function Tests: No results for input(s): TSH, T4TOTAL, FREET4, T3FREE, THYROIDAB in the last 72 hours. Anemia Panel: No results for input(s): VITAMINB12, FOLATE, FERRITIN, TIBC, IRON, RETICCTPCT in the last 72 hours. Sepsis Labs: No results for input(s): PROCALCITON, LATICACIDVEN in the last 168 hours.  Recent Results (from the past 240 hour(s))  Resp Panel by RT-PCR (Flu A&B, Covid) Nasopharyngeal Swab     Status: None   Collection Time: 11/18/20  1:48 PM   Specimen: Nasopharyngeal Swab; Nasopharyngeal(NP) swabs in vial transport medium  Result Value Ref Range Status   SARS Coronavirus 2 by RT PCR NEGATIVE NEGATIVE Final    Comment: (NOTE) SARS-CoV-2 target nucleic acids are NOT DETECTED.  The SARS-CoV-2 RNA is generally detectable in upper respiratory specimens during the acute phase of infection. The lowest concentration of SARS-CoV-2 viral copies this assay can detect is 138 copies/mL. A negative result does not preclude SARS-Cov-2 infection and should not be used as the sole basis for treatment or other patient management decisions. A negative result may occur with  improper specimen collection/handling, submission of specimen other than nasopharyngeal swab, presence of viral mutation(s) within the areas targeted by this assay, and inadequate number of viral copies(<138 copies/mL). A negative result must be combined with clinical observations, patient history, and epidemiological information. The expected result is Negative.  Fact Sheet for Patients:  BloggerCourse.com  Fact Sheet for Healthcare Providers:   SeriousBroker.it  This test is no t yet approved or cleared by the [place] FDA and  has been authorized for detection and/or diagnosis of SARS-CoV-2 by FDA under an Emergency Use Authorization (EUA). This EUA will remain  in effect (meaning this test can be used) for the duration of the COVID-19 declaration under Section 564(b)(1) of the Act, 21 U.S.C.section 360bbb-3(b)(1), unless the authorization is terminated  or revoked sooner.       Influenza A by PCR NEGATIVE NEGATIVE Final   Influenza B by PCR NEGATIVE NEGATIVE Final    Comment: (NOTE) The Xpert Xpress SARS-CoV-2/FLU/RSV plus assay is intended as an aid in the diagnosis of influenza from Nasopharyngeal swab specimens and should not be used as a sole basis for treatment. Nasal washings and aspirates are unacceptable for Xpert Xpress SARS-CoV-2/FLU/RSV testing.  Fact Sheet for Patients: BloggerCourse.com  Fact Sheet for Healthcare Providers: SeriousBroker.it  This test is not yet approved or cleared by the [place] FDA and has been authorized for detection and/or diagnosis of SARS-CoV-2 by FDA under an Emergency Use Authorization (EUA). This EUA will remain in effect (meaning this test can be used) for the duration of the COVID-19 declaration under Section 564(b)(1) of the Act, 21 U.S.C. section 360bbb-3(b)(1), unless the authorization is terminated or revoked.  Performed at [facility], [address], [place], [place] [address]   Urine Culture     Status: Abnormal   Collection Time: 11/19/20 12:18 PM   Specimen: Urine, Clean Catch  Result Value Ref Range Status   Specimen Description URINE, CLEAN CATCH  Final   Special Requests NONE  Final  Culture (A)  Final    <10,000 COLONIES/mL INSIGNIFICANT GROWTH Performed at [facility], [address] [place], [place] [address]    Report Status 11/20/2020 FINAL  Final   Respiratory (~20 pathogens) panel by PCR     Status: None   Collection Time: 11/20/20 12:26 PM   Specimen: Nasopharyngeal Swab; Respiratory  Result Value Ref Range Status   Adenovirus NOT DETECTED NOT DETECTED Final   Coronavirus 229E NOT DETECTED NOT DETECTED Final    Comment: (NOTE) The Coronavirus on the Respiratory Panel, DOES NOT test for the novel  Coronavirus (2019 nCoV)    Coronavirus HKU1 NOT DETECTED NOT DETECTED Final   Coronavirus NL63 NOT DETECTED NOT DETECTED Final   Coronavirus OC43 NOT DETECTED NOT DETECTED Final   Metapneumovirus NOT DETECTED NOT DETECTED Final   Rhinovirus / Enterovirus NOT DETECTED NOT DETECTED Final   Influenza A NOT DETECTED NOT DETECTED Final   Influenza B NOT DETECTED NOT DETECTED Final   Parainfluenza Virus 1 NOT DETECTED NOT DETECTED Final   Parainfluenza Virus 2 NOT DETECTED NOT DETECTED Final   Parainfluenza Virus 3 NOT DETECTED NOT DETECTED Final   Parainfluenza Virus 4 NOT DETECTED NOT DETECTED Final   Respiratory Syncytial Virus NOT DETECTED NOT DETECTED Final   Bordetella pertussis NOT DETECTED NOT DETECTED Final   Bordetella Parapertussis NOT DETECTED NOT DETECTED Final   Chlamydophila pneumoniae NOT DETECTED NOT DETECTED Final   Mycoplasma pneumoniae NOT DETECTED NOT DETECTED Final    Comment: Performed at [facility], [address] [place], [place] [address]     RN Pressure Injury Documentation:     Estimated body mass index is 26.52 kg/m as calculated from the following:   Height as of this encounter:  (1.575 m).   Weight as of this encounter: 65.8 kg.  Malnutrition Type:  Malnutrition Characteristics:   Nutrition Interventions:    Radiology Studies: DG CHEST PORT 1 VIEW  Result Date: 11/21/2020 CLINICAL DATA:  Shortness of breath. Evaluate atelectasis or scarring. Pneumonia or effusion. EXAM: PORTABLE CHEST 1 VIEW COMPARISON:  November 20, 2020 FINDINGS: Cardiomegaly. The hila and mediastinum are  unchanged. No pneumothorax. Bilateral pulmonary opacities the large interstitial component but more focal opacities in the bases. IMPRESSION: 1. Bilateral pulmonary opacities. The opacities are nonspecific but an infectious process is favored due to the more focal opacities in the bases. Recommend attention on follow-up. Electronically Signed   By: Gerome Sam III M.D.   On: 11/21/2020 13:35   DG CHEST PORT 1 VIEW  Result Date: 11/20/2020 CLINICAL DATA:  Short of breath.  History of CHF. EXAM: PORTABLE CHEST 1 VIEW COMPARISON:  Chest x-ray and CT chest 11/18/2020 FINDINGS: Postop CABG. Negative for heart failure or edema. Prominent lung markings most consistent with chronic lung disease. Mild left lower lobe atelectasis or scarring. Negative for pneumonia or effusion. Degenerative change in both shoulders. IMPRESSION: Negative for heart failure or pneumonia. Mild left lower lobe atelectasis. Suspect underlying chronic lung disease. Electronically Signed   By: Marlan Palau M.D.   On: 11/20/2020 08:40    Scheduled Meds:  calcium-vitamin D  1 tablet Oral BID WC   dabigatran  75 mg Oral Q12H   diltiazem  180 mg Oral QHS   docusate sodium  100 mg Oral BID   doxycycline  100 mg Oral Q12H   insulin aspart  0-9 Units Subcutaneous TID WC   levETIRAcetam  250 mg Oral QHS   levothyroxine  75  mcg Oral QAC breakfast   memantine  10 mg Oral Daily   methenamine  500 mg Oral BID   metoprolol succinate  25 mg Oral Daily   mirtazapine  7.5 mg Oral QHS   pantoprazole  40 mg Oral Daily   polyvinyl alcohol  1 drop Both Eyes QID   pravastatin  40 mg Oral QHS   sodium chloride flush  3 mL Intravenous Q12H   torsemide  50 mg Oral Daily   Continuous Infusions:    LOS: 2 days   Merlene Laughter, DO [facility] PAGER is on AMION  If 7PM-7AM, please contact night-coverage [email or web address]

## 2020-11-21 NOTE — Progress Notes (Signed)
Pharmacy Antibiotic Note  [name] is a 87 y.o. female admitted on 11/18/2020 with increased confusion in the setting of baseline dementia and cough with hypoxia, overall concern for UTI.  Pharmacy has been consulted for aztreonam and vancomycin dosing, note vancomycin discontinued after one time dose on 8/10.   Patient is on day 4 of aztreonam therapy. Note unclear history of allergy to cephalosporins including potential throat closure with cefuroxime and rash with cephalexin and ciprofloxacin. Note she is reported to have had a dose of fosfomycin as an outpatient and that OSH culture (unclear organism) is resistant to Bactrim. She has a history of positive urine cultures including proteus mirabilis, E. Coli, and enterococcus.   She remains afebrile today with stable vitals. Renal function continues to decrease with an increase in creatinine today of 1.75 (eCrCl ~ 19 ml/min). Urine culture remains with insignificant growth.   Plan: Continue aztreonam 0.5 gram IV every 8 hours  Monitor renal function, clinical status Consider 5-7 days of therapy and change to oral antibiotic  Height: 5\' 2"  (157.5 cm) (self reported) Weight: 65.8 kg (145 lb) (self reported) IBW/kg (Calculated) : 50.1  Temp (24hrs), Avg:98.4 F (36.9 C), Min:97.8 F (36.6 C), Max:99.5 F (37.5 C)  Recent Labs  Lab 11/18/20 1250 11/19/20 0304 11/20/20 0403 11/21/20 0425  WBC 9.6 8.8 7.9 8.1  CREATININE 1.27* 1.30* 1.65* 1.75*    Estimated Creatinine Clearance: 19.8 mL/min (A) (by C-G formula based on SCr of 1.75 mg/dL (H)).    Allergies  Allergen Reactions   Cefuroxime Anaphylaxis and Other (See Comments)    Throat swelling - MAR- Dtr cannot recall this reaction, just rash    Fish Allergy Shortness Of Breath    Pt said she has throat swelling    Ciprofloxacin Rash    Per MAR   Codeine Other (See Comments)    Jittery - Per MAR   Sertraline Hcl Rash    Per MAR   Cephalexin Rash    Per MAR   Paxil  [Paroxetine Hcl] Other (See Comments)    Delirium while in hospital reports of hallucinations.    Antimicrobials this admission: Vancomycin 8/10 x1 Aztreonam 8/10 >> PTA Fosfomycin 8/2  Menthenamine ppx  Microbiology results: 8/11 Ucx (after abx): <10k col/ml  Thank you for allowing pharmacy to be a part of this patient's care.  10/11 Audra Kagel, PharmD, BCPS 11/21/2020 11:49 AM

## 2020-11-21 NOTE — Progress Notes (Signed)
Attempted Mesenteric study- Technically difficult. Will retry again Monday.   Blanch Media 11/21/2020 9:32 AM

## 2020-11-22 ENCOUNTER — Inpatient Hospital Stay (HOSPITAL_COMMUNITY): Payer: Medicare PPO

## 2020-11-22 LAB — CBC WITH DIFFERENTIAL/PLATELET
Abs Immature Granulocytes: 0.03 10*3/uL (ref 0.00–0.07)
Basophils Absolute: 0.1 10*3/uL (ref 0.0–0.1)
Basophils Relative: 1 %
Eosinophils Absolute: 0.4 10*3/uL (ref 0.0–0.5)
Eosinophils Relative: 6 %
HCT: 38.9 % (ref 36.0–46.0)
Hemoglobin: 13 g/dL (ref 12.0–15.0)
Immature Granulocytes: 0 %
Lymphocytes Relative: 31 %
Lymphs Abs: 2.4 10*3/uL (ref 0.7–4.0)
MCH: 32 pg (ref 26.0–34.0)
MCHC: 33.4 g/dL (ref 30.0–36.0)
MCV: 95.8 fL (ref 80.0–100.0)
Monocytes Absolute: 0.8 10*3/uL (ref 0.1–1.0)
Monocytes Relative: 10 %
Neutro Abs: 4 10*3/uL (ref 1.7–7.7)
Neutrophils Relative %: 52 %
Platelets: 170 10*3/uL (ref 150–400)
RBC: 4.06 MIL/uL (ref 3.87–5.11)
RDW: 14.4 % (ref 11.5–15.5)
WBC: 7.8 10*3/uL (ref 4.0–10.5)
nRBC: 0 % (ref 0.0–0.2)

## 2020-11-22 LAB — COMPREHENSIVE METABOLIC PANEL
ALT: 9 U/L (ref 0–44)
AST: 16 U/L (ref 15–41)
Albumin: 2.8 g/dL — ABNORMAL LOW (ref 3.5–5.0)
Alkaline Phosphatase: 56 U/L (ref 38–126)
Anion gap: 9 (ref 5–15)
BUN: 48 mg/dL — ABNORMAL HIGH (ref 8–23)
CO2: 25 mmol/L (ref 22–32)
Calcium: 9.3 mg/dL (ref 8.9–10.3)
Chloride: 104 mmol/L (ref 98–111)
Creatinine, Ser: 1.9 mg/dL — ABNORMAL HIGH (ref 0.44–1.00)
GFR, Estimated: 25 mL/min — ABNORMAL LOW (ref >60)
Glucose, Bld: 120 mg/dL — ABNORMAL HIGH (ref 70–99)
Potassium: 3.7 mmol/L (ref 3.5–5.1)
Sodium: 138 mmol/L (ref 135–145)
Total Bilirubin: 1.2 mg/dL (ref 0.3–1.2)
Total Protein: 7.7 g/dL (ref 6.5–8.1)

## 2020-11-22 LAB — GLUCOSE, CAPILLARY
Glucose-Capillary: 118 mg/dL — ABNORMAL HIGH (ref 70–99)
Glucose-Capillary: 143 mg/dL — ABNORMAL HIGH (ref 70–99)
Glucose-Capillary: 145 mg/dL — ABNORMAL HIGH (ref 70–99)

## 2020-11-22 LAB — MAGNESIUM: Magnesium: 2 mg/dL (ref 1.7–2.4)

## 2020-11-22 LAB — PHOSPHORUS: Phosphorus: 4.1 mg/dL (ref 2.5–4.6)

## 2020-11-22 IMAGING — DX DG CHEST 1V PORT
1 series · 1 of 1 positions shown · non-contrast
Comparison: [DATE]

CLINICAL DATA: Shortness of breath

EXAM:
PORTABLE CHEST 1 VIEW

[chest ap]
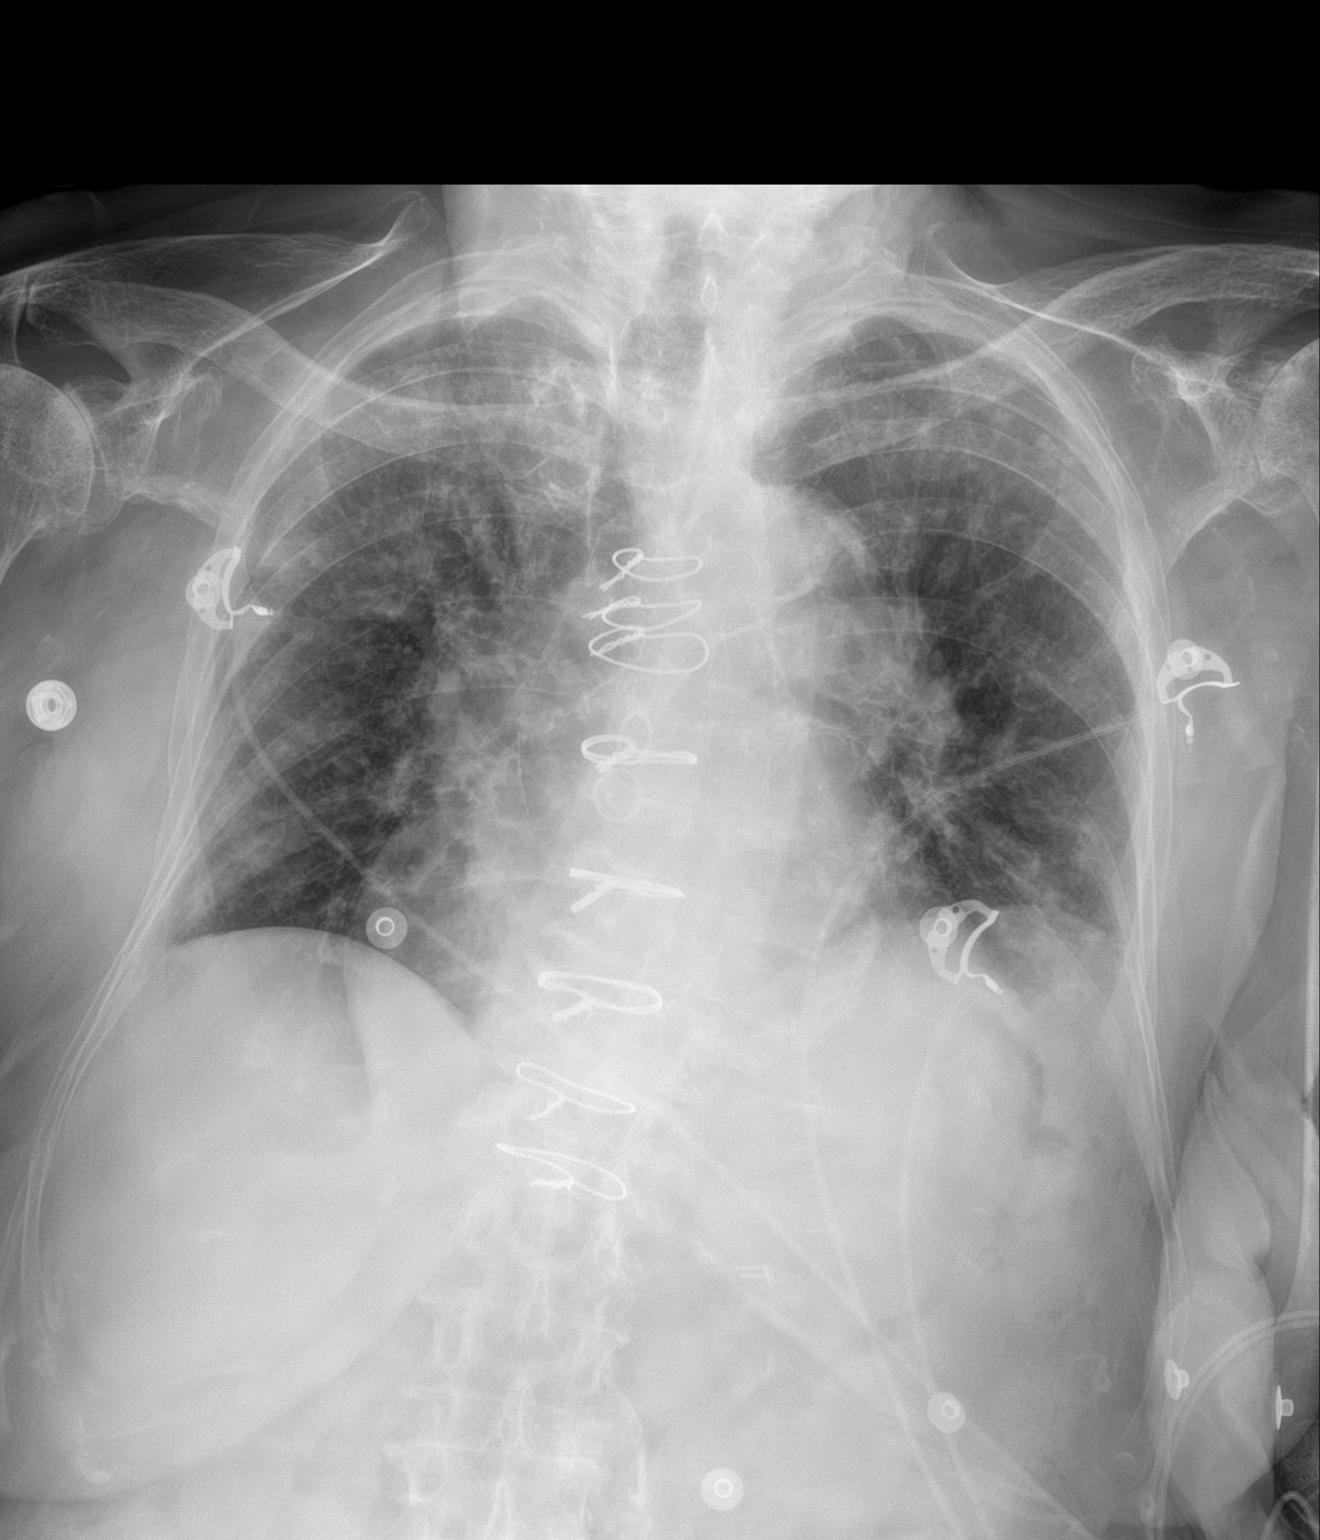

[1 of 1 positions shown; findings below may reference images not displayed]

FINDINGS: Bilateral patchy pulmonary opacities. The cardiomediastinal
silhouette is stable. No pneumothorax. No other abnormalities or
changes.
IMPRESSION: Stable bilateral patchy pulmonary opacities worrisome for pneumonia.
Recommend follow-up to resolution.

## 2020-11-22 MED ORDER — PANTOPRAZOLE SODIUM 40 MG PO TBEC
40.0000 mg | DELAYED_RELEASE_TABLET | Freq: Two times a day (BID) | ORAL | Status: DC
  Administered 2020-11-22 – 2020-11-23 (×2): 40 mg via ORAL
  Filled 2020-11-22 (×2): qty 1

## 2020-11-22 MED ORDER — GUAIFENESIN-DM 100-10 MG/5ML PO SYRP: 5.0000 mL | ORAL_SOLUTION | ORAL | Status: DC | PRN

## 2020-11-22 NOTE — Plan of Care (Signed)
  Problem: Education: Goal: Knowledge of General Education information will improve Description: Including pain rating scale, medication(s)/side effects and non-pharmacologic comfort measures 11/22/2020 1646 by Coralie Common, RN Outcome: Progressing 11/22/2020 1640 by Coralie Common, RN Outcome: Progressing   Problem: Health Behavior/Discharge Planning: Goal: Ability to manage health-related needs will improve 11/22/2020 1646 by Coralie Common, RN Outcome: Progressing 11/22/2020 1640 by Coralie Common, RN Outcome: Progressing   Problem: Clinical Measurements: Goal: Ability to maintain clinical measurements within normal limits will improve 11/22/2020 1646 by Coralie Common, RN Outcome: Progressing 11/22/2020 1640 by Coralie Common, RN Outcome: Progressing Goal: Will remain free from infection 11/22/2020 1646 by Coralie Common, RN Outcome: Progressing 11/22/2020 1640 by Coralie Common, RN Outcome: Progressing Goal: Diagnostic test results will improve 11/22/2020 1646 by Coralie Common, RN Outcome: Progressing 11/22/2020 1640 by Coralie Common, RN Outcome: Progressing Goal: Respiratory complications will improve 11/22/2020 1646 by Coralie Common, RN Outcome: Progressing 11/22/2020 1640 by Coralie Common, RN Outcome: Progressing Goal: Cardiovascular complication will be avoided 11/22/2020 1646 by Coralie Common, RN Outcome: Progressing 11/22/2020 1640 by Coralie Common, RN Outcome: Progressing   Problem: Pain Managment: Goal: General experience of comfort will improve 11/22/2020 1646 by Coralie Common, RN Outcome: Progressing 11/22/2020 1640 by Coralie Common, RN Outcome: Progressing   Problem: Elimination: Goal: Will not experience complications related to bowel motility 11/22/2020 1646 by Coralie Common, RN Outcome: Progressing 11/22/2020 1640 by Coralie Common, RN Outcome: Progressing Goal: Will not experience complications related to urinary  retention 11/22/2020 1646 by Coralie Common, RN Outcome: Progressing 11/22/2020 1640 by Coralie Common, RN Outcome: Progressing   Problem: Skin Integrity: Goal: Risk for impaired skin integrity will decrease 11/22/2020 1646 by Coralie Common, RN Outcome: Progressing 11/22/2020 1640 by Coralie Common, RN Outcome: Progressing

## 2020-11-22 NOTE — Progress Notes (Signed)
PROGRESS NOTE    [name]  XBJ:[id number] DOB: June 18, 1929 DOA: 11/18/2020 PCP: [name], MD   Brief Narrative:  The patient is an 87 year old overweight Caucasian female with a past medical history significant for but not limited to dementia, hypothyroidism, CKD stage IIIb, hypertension, hyperlipidemia, diabetes mellitus type 2, atrial fibrillation, history of chronic diastolic CHF, CAD status post CABG, history of DVT, rheumatoid arthritis, history of recurrent UTIs, history of CVA who presented with some increased confusion, hypoxia and cough.  Patient is a poor historian so most of the history is obtained from the chart review and family and she is admitted from [facility] with increased confusion, hypoxia at the facility and ongoing cough.  She has a history of recurrent UTIs and has had a single dose of fosfomycin on 11/10/2020.  At her facility she was reportedly have a saturation around 70% and was 85 in the EMS and improved with 2 L in route.  In the ED her vital signs were significant for elevated respiratory rate.  Lab work-up showed a BUN/creatinine of 25/1.27.  Troponins were flat but she did have elevated BNP that was elevated to 400.  Urinalysis showed leukocytes and white cells and bacteria and chest x-ray with no acute abnormality.  CTA PE done showed no PE but there was cardiomegaly and trace pulmonary edema and age-indeterminate T6 compression fracture with recommendation for MRI and follow-up.  Antibiotics per pharmacy for UTI were ordered in the ED due to her advanced allergies and recent treatment and will de-escalate and stop IV vancomycin.  Patient continues to cough but chest x-ray today was unrevealing and did not show any evidence of active heart failure or pneumonia.  Likely she has a bronchitis so we we will obtain a respiratory virus panel and start her on a flutter valve, incentive spirometer as well as Xopenex and Atrovent.  She continued to complain about some  intermittent abdominal pain so we will obtain a mesenteric ultrasound to evaluate her celiac stent and this is still pending to be done and today it was a technically difficult study so will be reattempted Monday.  Her wheezing seems to be improving with the Xopenex and Atrovent.  She feels okay eysterday and Respiratory virus panel was negative so would discontinue droplet precautions.  Repeat imaging consistent with pneumonia so we will continue doxycycline for now   Assessment & Plan:   Principal Problem:   Acute metabolic encephalopathy Active Problems:   Hypothyroidism   ATRIAL FIBRILLATION   Chronic diastolic heart failure (HCC)   CEREBROVASCULAR DISEASE   PERIPHERAL VASCULAR DISEASE   DNR (do not resuscitate)   Rheumatoid arthritis (HCC)   Diabetes mellitus with renal complications (HCC)   Frequent UTI   CKD (chronic kidney disease) stage 3, GFR 30-59 ml/min (HCC)   CAD (coronary artery disease)   Hyperlipidemia   Hypertension   Vascular dementia, uncomplicated (HCC)    Acute Encephalopathy in the setting of UTI and Hypoxia from pneumonia UTI Recurrent UTI -Metabolic encephalopathy suspected early due to UTI. -Has history of recurrent UTI had previously been on methenamine. > Recently treated for UTI about a week ago with fosfomycin.  Has presented similarly with UTI in the past.  Does have leukocytes, white cells and bacteria in urine. -Pharmacy consulted for antibiotics in the ED due to patient's recent antibiotics as well as allergies. - No fever or leukocytosis in the ED.  Urine studies ordered unfortunately urine culture was not sent until after she had received  antibiotics -Urinalysis showed straw-colored urine with negative glucose, negative hemoglobin, negative ketones, large leukocytes, rare bacteria, 0-5 squamous epithelial cells and greater than 50 WBCs; Repeat Cx showed <10,000 CFU  -Continue with antibiotics per pharmacy and will stop IV Vanc and now stop IV and  change and then changed to p.o. doxycycline -Trend fever curve and white count -Continue with PT OT and they are recommending SNF but patient's daughter does not want her going back to the SNF facility at [facility] but wants her to go back to her room with PT and OT.  We will need to discuss with social work in the morning -She will need to see Urology in the outpatient settings to discuss with them about Recurrent UTI's    Acute Respiratory Failure with Hypoxia volume overload likely from acute on chronic diastolic CHF versus Bilateral Basilar PNA vs Bronchitis but likely secondary to pneumonia >Initially required 2 L in the ED.   -However she has been weaned off this in the ED without significant intervention.   -SpO2: 96 % O2 Flow Rate (L/min): 2 L/min -Continuous pulse oximetry maintain O2 saturations greater than 90% -Continue supplemental oxygen via nasal cannula and wean to room air -Continue monitor respiratory status carefully -Check Respiratory Virus Panel and place on Droplet but this was negative so we will discontinue droplet precautions -C/w Xopenex/Atrovent -C/w Doxycycline as above  -Flutter Valve and Incentive Spirometry; have asked nursing to provide this for her again -Antitussives with benzonatate and Robitussin-DM -We will continue to monitor and consider diuresis if recurs as below. -We will need an ambulatory home O2 screen prior to discharge and repeat chest x-ray in a.m.; chest x-ray yesterday showed "Bilateral pulmonary opacities. The opacities are nonspecific but an infectious process is favored due to the more focal opacities in the bases. Recommend attention on follow-up." -Repeat chest x-ray today shows "Stable bilateral patchy pulmonary opacities worrisome for pneumonia. Recommend follow-up to resolution."   Acute on Chronic Diastolic CHF, improved > History of diastolic heart failure.   -Last Echo in our system on February of this year with EF 55-60%,  hypertrophy with indeterminate diastolic parameters as well as normal RV function > Mildly elevated BNP to 400.  No significant edema CT scan showed some volume overload.  -Hypoxia as above has resolved in the ED. rales noted on exam. - Low dose IV lasix and given 20 mg x 1 -Strict I's and O's and daily weights; she is -1464.2 mL -We will hold her torsemide given that renal function continues to worsen-Continue to monitor for signs and symptoms of volume overload -Repeat chest x-ray in the a.m. and will need ambulatory home O2 screen prior to discharge   Dementia -Continue home Namenda  ?Abdominal Pain -Patient has Dementia and on exam she appeared to be nontender did not grimace but could not tell me if she was having abdominal pain or not -Discussed with Dr. About obtaining vascular imaging given her mesenteric artery and celiac stenosis and stenting getting a mesenteric ultrasound while she is in the hospital and this is still pending to be done   T6 Compression Fracture  > CT scan showed signs of age-indeterminate compression fracture.  Patient does have some pain there -Recommending MRI for follow-up and I spoke to the patient daughter about this and we also consulted IR.  IR would like an MRI before possible KP or VP but patient's Daughter to decide whether she wants to pursue an daughter states that she does not want  to pursue an MRI or vertebroplasty or kyphoplasty -Obtain PT and OT   Hypothyroidism -Check TSH in the AM  -Continue home Synthroid  GERD -Continue PPI with pantoprazole 40 mg po Daily and will increase to twice daily dosing   CKD 3B > Creatinine stable in the ED. BUNs/creatinine is now 27/1.30 -> 35/1.65 -> 45/1.75 and worsened to 48/1.90 -Will hold her torsemide 50 mg p.o. daily - Avoid nephrotoxic agents, contrast dyes, hypotension renally dose medications - Trend renal function and electrolytes - Repeat CMP in the AM    Hypertension > Blood pressure stable  in the ED - Continue home diltiazem, metoprolol -Continue monitor blood pressures per protocol -Last BP was 142/70   Diabetes - SSI -CBGs ranging from 118-145   A. Fib -Continue home Diltiazem, metoprolol, Pradaxa -Continue to Monitor on Telemetry   CAD status post CABG - Continue home metoprolol, statin   History of DVT -On chronic Pradaxa as above.   Rheumatoid Arthritis  -Had previously been on methotrexate however is not currently taking this.   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 Wilford Corner "If there is significant concern for drowsiness with Keppra, change it to once every other day for a week and discontinue.  Please follow-up with your outpatient neurologist in the next 2 weeks.  Maintain seizure precautions" -Will discuss Neurology Recc's with family and daughter is updated and she will discuss with her primary neurologist about doing this in outpatient setting  DVT prophylaxis: Anticoagulated with Pradaxa Code Status: FULL CODE  Family Communication: Called Daughter [name] at 18:42 to discuss case but she did not answer  Disposition Plan: Will need PT OT further evaluation recommendations and they are recommending SNF  Status is: Inpatient  Remains inpatient appropriate because:Unsafe d/c plan, IV treatments appropriate due to intensity of illness or inability to take PO, and Inpatient level of care appropriate due to severity of illness  Dispo: The patient is from: Home              Anticipated d/c is to:  TBD               Patient currently is not medically stable to d/c.   Difficult to place patient No   Consultants:  Discussed case with Neurology Discussed case with Vascular  Procedures:   Antimicrobials:  Anti-infectives (From admission, onward)    Start     Dose/Rate Route Frequency Ordered Stop   11/21/20 2000  doxycycline (VIBRA-TABS) tablet 100 mg        100 mg Oral Every 12 hours 11/21/20 1839     11/20/20 2200  vancomycin (VANCOREADY) IVPB 1250 mg/250 mL  Status:  Discontinued        1,250 mg 166.7 mL/hr over 90 Minutes Intravenous Every 48 hours 11/18/20 2129 11/19/20 0841   11/20/20 2200  vancomycin (VANCOCIN) IVPB 1000 mg/200 mL premix  Status:  Discontinued        1,000 mg 200 mL/hr over 60 Minutes Intravenous Every 48 hours 11/19/20 0841 11/20/20 1325   11/19/20 1000  methenamine (MANDELAMINE) tablet 500 mg        500 mg Oral 2 times daily 11/18/20 2155     11/18/20 2200  aztreonam (AZACTAM) 0.5 g in dextrose 5 % 50 mL IVPB  Status:  Discontinued        0.5 g 100 mL/hr over 30 Minutes Intravenous Every 8 hours 11/18/20 2104 11/21/20 1839   11/18/20 2115  vancomycin (VANCOREADY) IVPB 1500 mg/300 mL        1,500 mg 150 mL/hr over 120 Minutes Intravenous  Once 11/18/20 2104 11/18/20 2359        Subjective: Seen and examined at bedside and she is more somnolent today.  Daughter noticed that she is still coughing somewhat.  Flutter valve incentive spirometry is still not been given.  No chest pain or shortness of breath.  Patient wanting to rest.  No other concerns or complaints at this time.  Objective: Vitals:   11/22/20 0259 11/22/20 0748 11/22/20 1117 11/22/20 1517  BP: (!) 148/78 (!) 153/67 124/73 (!) 142/70  Pulse: 72 (!) 48 64 69  Resp: Temp: 98.6 F (37 C) 98 F (36.7 C) 98.6 F (37 C) 98 F (36.7 C)  TempSrc: Oral     SpO2: 92% 95% 98% 96%  Weight:      Height:        Intake/Output Summary (Last 24 hours) at 11/22/2020 1734 Last data filed at  11/21/2020 2002 Gross per 24 hour  Intake 3 ml  Output --  Net 3 ml    Filed Weights   11/18/20 2000  Weight: 65.8 kg   Examination: Physical Exam:  Constitutional: WN/WD pleasantly demented Caucasian female who is more somnolent and in no acute distress resting. Eyes: Lids and conjunctivae normal, sclerae anicteric  ENMT: External Ears, Nose appear normal.  She is a little hard of hearing Neck: Appears normal, supple, no cervical masses, normal ROM, no appreciable thyromegaly; no JVD Respiratory: Diminished to auscultation bilaterally with coarse breath sounds, no appreciable wheezing, rales, rhonchi or crackles. Normal respiratory effort and patient is not tachypenic. No accessory muscle use.  Unlabored breathing but he is still coughing and is a dry appearing cough Cardiovascular: RRR, no murmurs / rubs / gallops. S1 and S2 auscultated.  No appreciable extremity edema now Abdomen: Soft, non-tender, distended secondary body habitus.  Bowel sounds positive.  GU: Deferred. Musculoskeletal: No clubbing / cyanosis of digits/nails. No joint deformity upper and lower extremities.  Skin: No rashes, lesions, ulcers on limited skin evaluation. No induration; Warm and dry.  Neurologic: CN 2-12 grossly intact with no focal deficits.  Romberg sign and cerebellar reflexes not assessed.  Psychiatric: Impaired judgment and insight.  She is somnolent and drowsy. Normal mood and appropriate affect.   Data Reviewed: I have personally reviewed following labs and imaging studies  CBC: Recent Labs  Lab 11/18/20 1250 11/19/20 0304 11/20/20 0403 11/21/20 0425 11/22/20 0432  WBC 9.6 8.8 7.9 8.1 7.8  NEUTROABS 5.4  --  3.9 4.9 4.0  HGB 13.6 13.5 12.6 12.1 13.0  HCT 43.7 42.1 38.7 35.3* 38.9  MCV 100.2* 99.1 96.3 93.6 95.8  PLT 165 146* 150 163 170    Basic Metabolic Panel: Recent Labs  Lab 11/18/20 1250 11/19/20 0304 11/20/20 0403 11/21/20 0425 11/22/20 0432  NA 138 140 137 136 138  K  4.3 4.0 3.7 3.9 3.7  CL 101 102 99 100 104  CO2 GLUCOSE 132*  123* 120* 119* 120*  BUN 25* 27* 35* 45* 48*  CREATININE 1.27* 1.30* 1.65* 1.75* 1.90*  CALCIUM 10.1 9.6 9.6 9.4 9.3  MG  --  1.9 1.9 1.9 2.0  PHOS  --   --  4.0 3.7 4.1    GFR: Estimated Creatinine Clearance: 18.2 mL/min (A) (by C-G formula based on SCr of 1.9 mg/dL (H)). Liver Function Tests: Recent Labs  Lab 11/18/20 1250 11/19/20 0304 11/20/20 0403 11/21/20 0425 11/22/20 0432  AST 17 16 15 18 16   ALT 11 10 8 6 9   ALKPHOS 68 61 61 59 56  BILITOT 0.8 1.0 0.7 1.0 1.2  PROT 8.3* 7.8 7.6 7.1 7.7  ALBUMIN 3.2* 2.9* 2.8* 2.7* 2.8*    No results for input(s): LIPASE, AMYLASE in the last 168 hours. No results for input(s): AMMONIA in the last 168 hours. Coagulation Profile: No results for input(s): INR, PROTIME in the last 168 hours. Cardiac Enzymes: No results for input(s): CKTOTAL, CKMB, CKMBINDEX, TROPONINI in the last 168 hours. BNP (last 3 results) No results for input(s): PROBNP in the last 8760 hours. HbA1C: No results for input(s): HGBA1C in the last 72 hours. CBG: Recent Labs  Lab 11/21/20 1522 11/21/20 2147 11/22/20 0623 11/22/20 1117 11/22/20 1558  GLUCAP 163* 134* 118* 143* 145*    Lipid Profile: No results for input(s): CHOL, HDL, LDLCALC, TRIG, CHOLHDL, LDLDIRECT in the last 72 hours. Thyroid Function Tests: No results for input(s): TSH, T4TOTAL, FREET4, T3FREE, THYROIDAB in the last 72 hours. Anemia Panel: No results for input(s): VITAMINB12, FOLATE, FERRITIN, TIBC, IRON, RETICCTPCT in the last 72 hours. Sepsis Labs: No results for input(s): PROCALCITON, LATICACIDVEN in the last 168 hours.  Recent Results (from the past 240 hour(s))  Resp Panel by RT-PCR (Flu A&B, Covid) Nasopharyngeal Swab     Status: None   Collection Time: 11/18/20  1:48 PM   Specimen: Nasopharyngeal Swab; Nasopharyngeal(NP) swabs in vial transport medium  Result Value Ref Range Status   SARS  Coronavirus 2 by RT PCR NEGATIVE NEGATIVE Final    Comment: (NOTE) SARS-CoV-2 target nucleic acids are NOT DETECTED.  The SARS-CoV-2 RNA is generally detectable in upper respiratory specimens during the acute phase of infection. The lowest concentration of SARS-CoV-2 viral copies this assay can detect is 138 copies/mL. A negative result does not preclude SARS-Cov-2 infection and should not be used as the sole basis for treatment or other patient management decisions. A negative result may occur with  improper specimen collection/handling, submission of specimen other than nasopharyngeal swab, presence of viral mutation(s) within the areas targeted by this assay, and inadequate number of viral copies(<138 copies/mL). A negative result must be combined with clinical observations, patient history, and epidemiological information. The expected result is Negative.  Fact Sheet for Patients:  BloggerCourse.com  Fact Sheet for Healthcare Providers:  SeriousBroker.it  This test is no t yet approved or cleared by the [place] FDA and  has been authorized for detection and/or diagnosis of SARS-CoV-2 by FDA under an Emergency Use Authorization (EUA). This EUA will remain  in effect (meaning this test can be used) for the duration of the COVID-19 declaration under Section 564(b)(1) of the Act, 21 U.S.C.section 360bbb-3(b)(1), unless the authorization is terminated  or revoked sooner.       Influenza A by PCR NEGATIVE NEGATIVE Final   Influenza B by PCR NEGATIVE NEGATIVE Final    Comment: (NOTE) The Xpert Xpress SARS-CoV-2/FLU/RSV plus assay is intended as an aid in the diagnosis  of influenza from Nasopharyngeal swab specimens and should not be used as a sole basis for treatment. Nasal washings and aspirates are unacceptable for Xpert Xpress SARS-CoV-2/FLU/RSV testing.  Fact Sheet for  Patients: BloggerCourse.com  Fact Sheet for Healthcare Providers: SeriousBroker.it  This test is not yet approved or cleared by the [place] FDA and has been authorized for detection and/or diagnosis of SARS-CoV-2 by FDA under an Emergency Use Authorization (EUA). This EUA will remain in effect (meaning this test can be used) for the duration of the COVID-19 declaration under Section 564(b)(1) of the Act, 21 U.S.C. section 360bbb-3(b)(1), unless the authorization is terminated or revoked.  Performed at [facility], [address], [place], [place] [address]   Urine Culture     Status: Abnormal   Collection Time: 11/19/20 12:18 PM   Specimen: Urine, Clean Catch  Result Value Ref Range Status   Specimen Description URINE, CLEAN CATCH  Final   Special Requests NONE  Final   Culture (A)  Final    <10,000 COLONIES/mL INSIGNIFICANT GROWTH Performed at [facility], [address], [place], [place] [address]    Report Status 11/20/2020 FINAL  Final  Respiratory (~20 pathogens) panel by PCR     Status: None   Collection Time: 11/20/20 12:26 PM   Specimen: Nasopharyngeal Swab; Respiratory  Result Value Ref Range Status   Adenovirus NOT DETECTED NOT DETECTED Final   Coronavirus 229E NOT DETECTED NOT DETECTED Final    Comment: (NOTE) The Coronavirus on the Respiratory Panel, DOES NOT test for the novel  Coronavirus (2019 nCoV)    Coronavirus HKU1 NOT DETECTED NOT DETECTED Final   Coronavirus NL63 NOT DETECTED NOT DETECTED Final   Coronavirus OC43 NOT DETECTED NOT DETECTED Final   Metapneumovirus NOT DETECTED NOT DETECTED Final   Rhinovirus / Enterovirus NOT DETECTED NOT DETECTED Final   Influenza A NOT DETECTED NOT DETECTED Final   Influenza B NOT DETECTED NOT DETECTED Final   Parainfluenza Virus 1 NOT DETECTED NOT DETECTED Final   Parainfluenza Virus 2 NOT DETECTED NOT DETECTED Final   Parainfluenza Virus 3 NOT  DETECTED NOT DETECTED Final   Parainfluenza Virus 4 NOT DETECTED NOT DETECTED Final   Respiratory Syncytial Virus NOT DETECTED NOT DETECTED Final   Bordetella pertussis NOT DETECTED NOT DETECTED Final   Bordetella Parapertussis NOT DETECTED NOT DETECTED Final   Chlamydophila pneumoniae NOT DETECTED NOT DETECTED Final   Mycoplasma pneumoniae NOT DETECTED NOT DETECTED Final    Comment: Performed at [facility], [address] [place], [place] [address]     RN Pressure Injury Documentation:     Estimated body mass index is 26.52 kg/m as calculated from the following:   Height as of this encounter:  (1.575 m).   Weight as of this encounter: 65.8 kg.  Malnutrition Type:  Malnutrition Characteristics:   Nutrition Interventions:    Radiology Studies: DG CHEST PORT 1 VIEW  Result Date: 11/22/2020 CLINICAL DATA:  Shortness of breath EXAM: PORTABLE CHEST 1 VIEW COMPARISON:  November 21, 2020 FINDINGS: Bilateral patchy pulmonary opacities. The cardiomediastinal silhouette is stable. No pneumothorax. No other abnormalities or changes. IMPRESSION: Stable bilateral patchy pulmonary opacities worrisome for pneumonia. Recommend follow-up to resolution. Electronically Signed   By: Gerome Sam III M.D.   On: 11/22/2020 09:36   DG CHEST PORT 1 VIEW  Result Date: 11/21/2020 CLINICAL DATA:  Shortness of breath. Evaluate atelectasis or scarring. Pneumonia or effusion. EXAM: PORTABLE CHEST 1 VIEW  COMPARISON:  November 20, 2020 FINDINGS: Cardiomegaly. The hila and mediastinum are unchanged. No pneumothorax. Bilateral pulmonary opacities the large interstitial component but more focal opacities in the bases. IMPRESSION: 1. Bilateral pulmonary opacities. The opacities are nonspecific but an infectious process is favored due to the more focal opacities in the bases. Recommend attention on follow-up. Electronically Signed   By: Gerome Sam III M.D.   On: 11/21/2020 13:35    Scheduled Meds:   calcium-vitamin D  1 tablet Oral BID WC   dabigatran  75 mg Oral Q12H   diltiazem  180 mg Oral QHS   docusate sodium  100 mg Oral BID   doxycycline  100 mg Oral Q12H   insulin aspart  0-9 Units Subcutaneous TID WC   levETIRAcetam  250 mg Oral QHS   levothyroxine  75 mcg Oral QAC breakfast   memantine  10 mg Oral Daily   methenamine  500 mg Oral BID   metoprolol succinate  25 mg Oral Daily   mirtazapine  7.5 mg Oral QHS   pantoprazole  40 mg Oral BID   polyvinyl alcohol  1 drop Both Eyes QID   pravastatin  40 mg Oral QHS   sodium chloride flush  3 mL Intravenous Q12H   Continuous Infusions:    LOS: 3 days   Merlene Laughter, DO [facility] PAGER is on AMION  If 7PM-7AM, please contact night-coverage [email or web address]

## 2020-11-22 NOTE — Plan of Care (Signed)
  Problem: Education: Goal: Knowledge of General Education information will improve Description: Including pain rating scale, medication(s)/side effects and non-pharmacologic comfort measures Outcome: Progressing   Problem: Health Behavior/Discharge Planning: Goal: Ability to manage health-related needs will improve Outcome: Progressing   Problem: Clinical Measurements: Goal: Ability to maintain clinical measurements within normal limits will improve Outcome: Progressing Goal: Will remain free from infection Outcome: Progressing Goal: Diagnostic test results will improve Outcome: Progressing Goal: Respiratory complications will improve Outcome: Progressing Goal: Cardiovascular complication will be avoided Outcome: Progressing   Problem: Elimination: Goal: Will not experience complications related to bowel motility Outcome: Progressing Goal: Will not experience complications related to urinary retention Outcome: Progressing   Problem: Pain Managment: Goal: General experience of comfort will improve Outcome: Progressing   Problem: Skin Integrity: Goal: Risk for impaired skin integrity will decrease Outcome: Progressing   Problem: Elimination: Goal: Will not experience complications related to bowel motility Outcome: Progressing Goal: Will not experience complications related to urinary retention Outcome: Progressing

## 2020-11-23 ENCOUNTER — Inpatient Hospital Stay (HOSPITAL_COMMUNITY): Payer: Medicare PPO

## 2020-11-23 ENCOUNTER — Telehealth: Payer: Self-pay | Admitting: Diagnostic Neuroimaging

## 2020-11-23 DIAGNOSIS — I714 Abdominal aortic aneurysm, without rupture: Secondary | ICD-10-CM

## 2020-11-23 DIAGNOSIS — R109 Unspecified abdominal pain: Secondary | ICD-10-CM

## 2020-11-23 DIAGNOSIS — Z95828 Presence of other vascular implants and grafts: Secondary | ICD-10-CM

## 2020-11-23 DIAGNOSIS — I719 Aortic aneurysm of unspecified site, without rupture: Secondary | ICD-10-CM

## 2020-11-23 DIAGNOSIS — F0390 Unspecified dementia without behavioral disturbance: Secondary | ICD-10-CM

## 2020-11-23 LAB — COMPREHENSIVE METABOLIC PANEL
ALT: 9 U/L (ref 0–44)
AST: 16 U/L (ref 15–41)
Albumin: 2.8 g/dL — ABNORMAL LOW (ref 3.5–5.0)
Alkaline Phosphatase: 58 U/L (ref 38–126)
Anion gap: 8 (ref 5–15)
BUN: 43 mg/dL — ABNORMAL HIGH (ref 8–23)
CO2: 27 mmol/L (ref 22–32)
Calcium: 9.2 mg/dL (ref 8.9–10.3)
Chloride: 105 mmol/L (ref 98–111)
Creatinine, Ser: 1.73 mg/dL — ABNORMAL HIGH (ref 0.44–1.00)
GFR, Estimated: 28 mL/min — ABNORMAL LOW (ref >60)
Glucose, Bld: 181 mg/dL — ABNORMAL HIGH (ref 70–99)
Potassium: 3.5 mmol/L (ref 3.5–5.1)
Sodium: 140 mmol/L (ref 135–145)
Total Bilirubin: 1 mg/dL (ref 0.3–1.2)
Total Protein: 7.7 g/dL (ref 6.5–8.1)

## 2020-11-23 LAB — CBC WITH DIFFERENTIAL/PLATELET
Abs Immature Granulocytes: 0.02 10*3/uL (ref 0.00–0.07)
Basophils Absolute: 0.1 10*3/uL (ref 0.0–0.1)
Basophils Relative: 1 %
Eosinophils Absolute: 0.8 10*3/uL — ABNORMAL HIGH (ref 0.0–0.5)
Eosinophils Relative: 12 %
HCT: 38 % (ref 36.0–46.0)
Hemoglobin: 12.8 g/dL (ref 12.0–15.0)
Immature Granulocytes: 0 %
Lymphocytes Relative: 26 %
Lymphs Abs: 1.7 10*3/uL (ref 0.7–4.0)
MCH: 32.1 pg (ref 26.0–34.0)
MCHC: 33.7 g/dL (ref 30.0–36.0)
MCV: 95.2 fL (ref 80.0–100.0)
Monocytes Absolute: 0.5 10*3/uL (ref 0.1–1.0)
Monocytes Relative: 8 %
Neutro Abs: 3.5 10*3/uL (ref 1.7–7.7)
Neutrophils Relative %: 53 %
Platelets: 186 10*3/uL (ref 150–400)
RBC: 3.99 MIL/uL (ref 3.87–5.11)
RDW: 14.3 % (ref 11.5–15.5)
WBC: 6.7 10*3/uL (ref 4.0–10.5)
nRBC: 0 % (ref 0.0–0.2)

## 2020-11-23 LAB — GLUCOSE, CAPILLARY
Glucose-Capillary: 119 mg/dL — ABNORMAL HIGH (ref 70–99)
Glucose-Capillary: 237 mg/dL — ABNORMAL HIGH (ref 70–99)
Glucose-Capillary: 211 mg/dL — ABNORMAL HIGH (ref 70–99)

## 2020-11-23 LAB — RESP PANEL BY RT-PCR (FLU A&B, COVID) ARPGX2
Influenza A by PCR: NEGATIVE
Influenza B by PCR: NEGATIVE
SARS Coronavirus 2 by RT PCR: NEGATIVE

## 2020-11-23 LAB — PHOSPHORUS: Phosphorus: 2.9 mg/dL (ref 2.5–4.6)

## 2020-11-23 LAB — TSH: TSH: 1.74 u[IU]/mL (ref 0.350–4.500)

## 2020-11-23 LAB — MAGNESIUM: Magnesium: 2.1 mg/dL (ref 1.7–2.4)

## 2020-11-23 IMAGING — DX DG CHEST 1V PORT
1 series · 1 of 1 positions shown · non-contrast
Comparison: the previous day's study

CLINICAL DATA: SOB (shortness of breath) shortness of breath

EXAM:
PORTABLE CHEST - 1 VIEW

[chest]
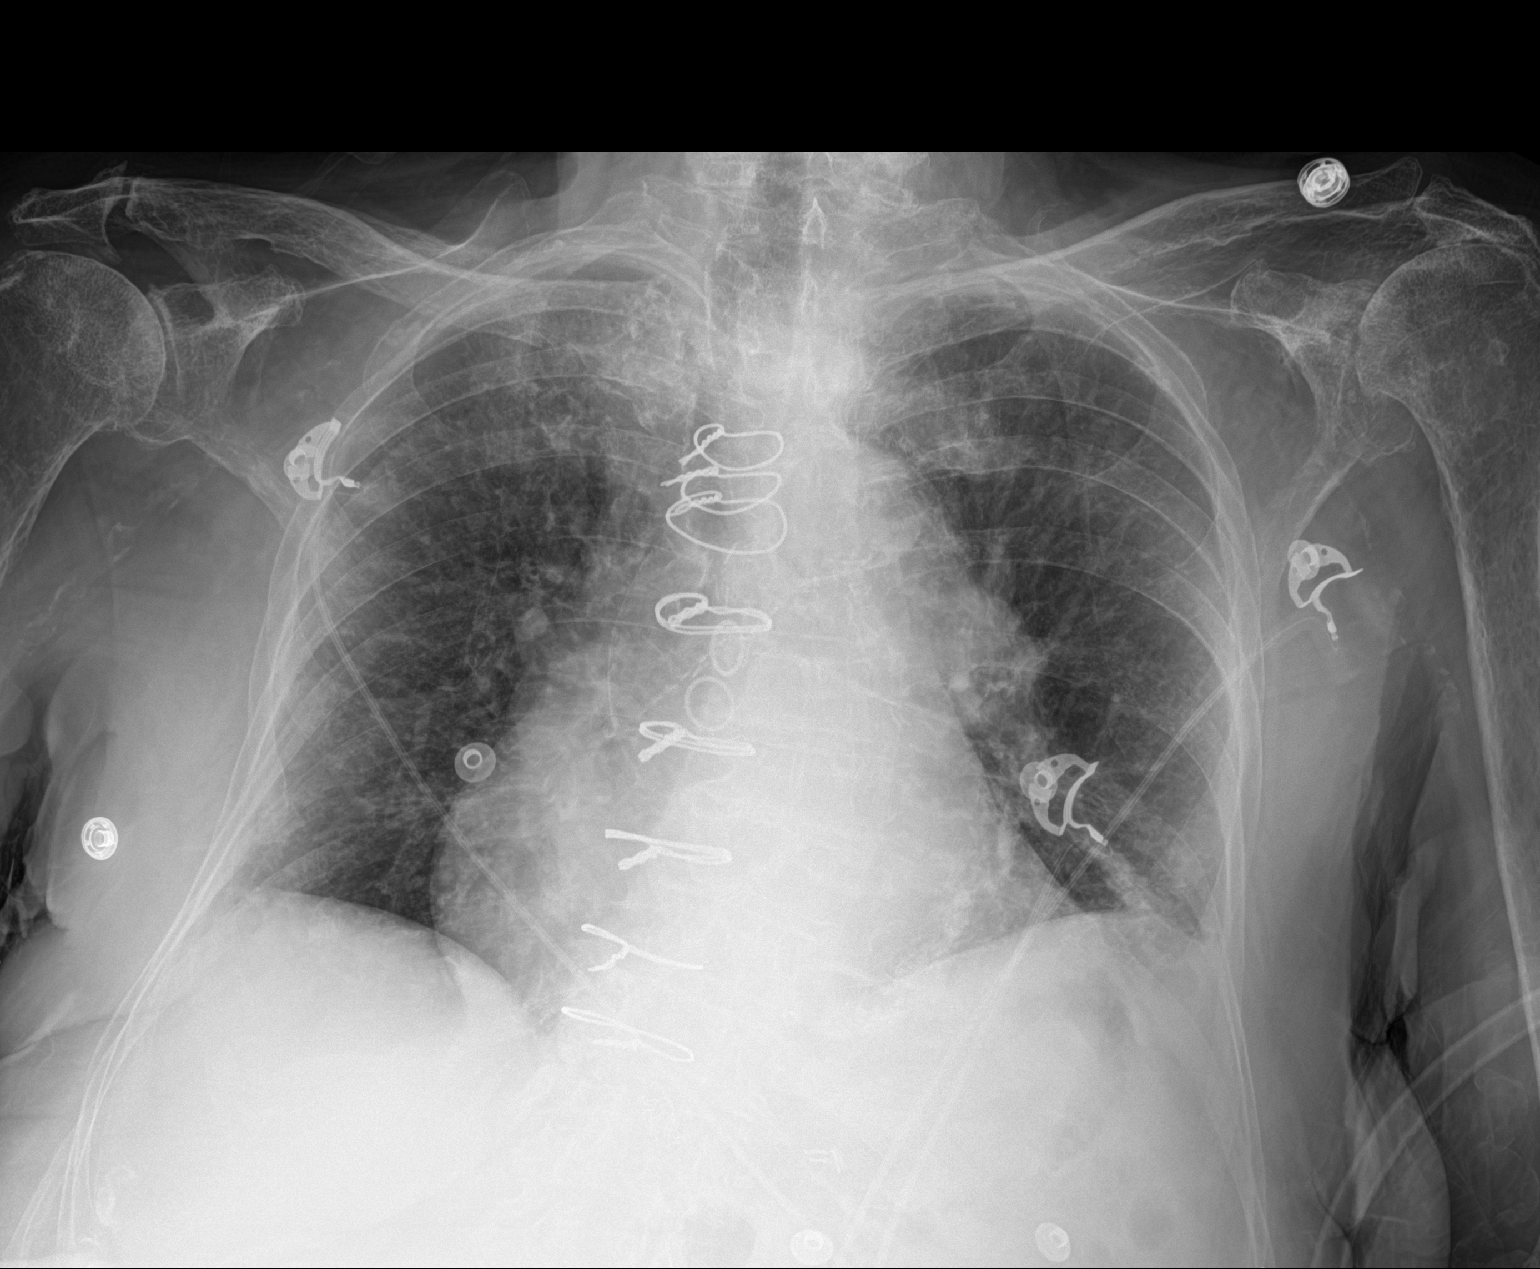

[1 of 1 positions shown; findings below may reference images not displayed]

FINDINGS: Coarse bilateral interstitial markings. Some improvement in the
peripheral ill-defined airspace opacities seen previously.

Heart size and mediastinal contours are within normal limits.
Previous CABG.

No effusion.  No pneumothorax.

Sternotomy wires.  Bilateral shoulder DJD.
IMPRESSION: Partial improvement in the ill-defined patchy peripheral airspace
opacities seen previously

## 2020-11-23 MED ORDER — GUAIFENESIN-DM 100-10 MG/5ML PO SYRP: 5.0000 mL | ORAL_SOLUTION | ORAL | 0 refills | Status: AC | PRN

## 2020-11-23 MED ORDER — PANTOPRAZOLE SODIUM 40 MG PO TBEC
40.0000 mg | DELAYED_RELEASE_TABLET | Freq: Two times a day (BID) | ORAL | Status: DC
Start: 2020-11-23 — End: 2020-11-23

## 2020-11-23 MED ORDER — DOXYCYCLINE HYCLATE 100 MG PO TABS: 100.0000 mg | ORAL_TABLET | Freq: Two times a day (BID) | ORAL | 0 refills | Status: AC

## 2020-11-23 MED ORDER — OMEPRAZOLE 20 MG PO CPDR: 20.0000 mg | DELAYED_RELEASE_CAPSULE | Freq: Two times a day (BID) | ORAL | 0 refills | Status: AC

## 2020-11-23 MED ORDER — ACETAMINOPHEN 325 MG PO TABS: 650.0000 mg | ORAL_TABLET | Freq: Four times a day (QID) | ORAL | Status: AC | PRN

## 2020-11-23 MED ORDER — BENZONATATE 100 MG PO CAPS: 100.0000 mg | ORAL_CAPSULE | Freq: Three times a day (TID) | ORAL | 0 refills | Status: AC | PRN

## 2020-11-23 NOTE — Plan of Care (Signed)
  Problem: Education: Goal: Knowledge of General Education information will improve Description: Including pain rating scale, medication(s)/side effects and non-pharmacologic comfort measures Outcome: Adequate for Discharge   

## 2020-11-23 NOTE — Care Management Important Message (Signed)
Important Message  Patient Details  Name: [name] MRN: [id number] Date of Birth: 04-11-1930   Medicare Important Message Given:  Yes     Dorena Bodo 11/23/2020, 2:12 PM

## 2020-11-23 NOTE — Telephone Encounter (Signed)
Daughter called back, stated mom to be discharged to SNF tonight or tomorrow. Hospitalist recommended to reduce keppra to 250 mg every other day , daughter thinks due to her report of patient staying sleepy. The hospitalist deferred decision  to Dr Marjory Lies . [name] asking what Dr Marjory Lies recommends. I advised will send note to him and let her know. She verbalized understanding, appreciation.

## 2020-11-23 NOTE — TOC Transition Note (Signed)
Transition of Care [facility]) - CM/SW Discharge Note   Patient Details  Name: [name] MRN: [id number] Date of Birth: 09/22/1929  [facility] CM/SW Contact:  Baldemar Lenis, LCSW Phone Number: 11/23/2020, 4:38 PM   Clinical Narrative:   Nurse to call report to [phone].    Final next level of care: Skilled Nursing Facility Barriers to Discharge: Barriers Resolved   Patient Goals and CMS Choice Patient states their goals for this hospitalization and ongoing recovery are:: patient unable to participate in goal setting, only oriented to self CMS Medicare.gov Compare Post Acute Care list provided to:: Patient Represenative (must comment) Choice offered to / list presented to : Adult Children  Discharge Placement              Patient chooses bed at: [facility] Patient to be transferred to facility by: PTAR Name of family member notified: [name] Patient and family notified of of transfer: 11/23/20  Discharge Plan and Services     Post Acute Care Choice: Skilled Nursing Facility                               Social Determinants of Health (SDOH) Interventions     Readmission Risk Interventions No flowsheet data found.

## 2020-11-23 NOTE — Telephone Encounter (Signed)
LVM for daughter and advised her that we note patient is admitted. Dr Marjory Lies advises she follow MD instructions while an inpatient. Left # for questions.

## 2020-11-23 NOTE — Progress Notes (Signed)
Order to discharge pt to [facility].  Discharge instructions/AVS given to PTAR, patient and family - education provided as needed.  Attempted to given report - pt previous [place] resident.

## 2020-11-23 NOTE — Plan of Care (Signed)

## 2020-11-23 NOTE — Progress Notes (Signed)
Physical Therapy Treatment Patient Details Name: [name] DOB: [birth year] Today's Date: 11/23/2020    History of Present Illness 87 y.o. female presents with some increased confusion, hypoxia, cough. CTA to r/o PE showed age-indeterminate T6 compression.Found to have UTI: PHMX: dementia, hypothyroidism, CKD 3B, hypertension, hyperlipidemia, diabetes, A. fib, CAD status post CABG, CHF (diastolic), history of DVT, rheumatoid arthritis, recurrent UTI, CVA who.    PT Comments    Pt required mod/max assist bed mobility, mod assist sit to stand, and mod assist ambulation 10' x 2 with upright rollator.  Pt total assist for toilet hygiene in bathroom. Returned to bed at end of session due to scheduled to go for test this AM.   Follow Up Recommendations  SNF (return to [facility])     Equipment Recommendations  None recommended by PT    Recommendations for Other Services       Precautions / Restrictions Precautions Precautions: Fall    Mobility  Bed Mobility Overal bed mobility: Needs Assistance Bed Mobility: Supine to Sit;Sit to Supine;Rolling Rolling: Mod assist   Supine to sit: Mod assist;HOB elevated Sit to supine: Max assist   General bed mobility comments: assist with all aspects of mobility, increased assist for return to bed due to fatigue    Transfers Overall transfer level: Needs assistance Equipment used:  (upright rollator) Transfers: Sit to/from Stand Sit to Stand: Mod assist         General transfer comment: cues for sequencing, increased time, assist to power up and stabilize balance  Ambulation/Gait Ambulation/Gait assistance: Mod assist Gait Distance (Feet): 10 Feet (x 2) Assistive device:  (upright rollator) Gait Pattern/deviations: Step-through pattern;Decreased stride length;Narrow base of support;Trunk flexed Gait velocity: decreased Gait velocity interpretation: <1.8 ft/sec, indicate of risk for recurrent falls General  Gait Details: slow step through gait, cues for sequencing, assist to maintain balance and manage RW   Stairs             Wheelchair Mobility    Modified Rankin (Stroke Patients Only)       Balance Overall balance assessment: Needs assistance Sitting-balance support: No upper extremity supported;Feet supported Sitting balance-Leahy Scale: Fair     Standing balance support: Bilateral upper extremity supported;During functional activity Standing balance-Leahy Scale: Poor Standing balance comment: reliant on external support                            Cognition Arousal/Alertness: Awake/alert Behavior During Therapy: WFL for tasks assessed/performed Overall Cognitive Status: History of cognitive impairments - at baseline                                 General Comments: slow processing. Minimal verbalizations/interaction      Exercises      General Comments General comments (skin integrity, edema, etc.): Pt soiled with urine/BM on arrival. Assisted to bathroom for further BM. Total assist with toilet hygeine, pericare.      Pertinent Vitals/Pain Pain Assessment: No/denies pain    Home Living                      Prior Function            PT Goals (current goals can now be found in the care plan section) Acute Rehab PT Goals Patient Stated Goal: not stated Progress towards PT goals: Progressing toward goals  Frequency    Min 2X/week      PT Plan Current plan remains appropriate    Co-evaluation              AM-PAC PT "6 Clicks" Mobility   Outcome Measure  Help needed turning from your back to your side while in a flat bed without using bedrails?: A Lot Help needed moving from lying on your back to sitting on the side of a flat bed without using bedrails?: A Lot Help needed moving to and from a bed to a chair (including a wheelchair)?: A Lot Help needed standing up from a chair using your arms (e.g.,  wheelchair or bedside chair)?: A Lot Help needed to walk in hospital room?: A Lot Help needed climbing 3-5 steps with a railing? : Total 6 Click Score: 11    End of Session Equipment Utilized During Treatment: Gait belt Activity Tolerance: Patient tolerated treatment well Patient left: in bed;with call bell/phone within reach;with bed alarm set Nurse Communication: Mobility status PT Visit Diagnosis: Unsteadiness on feet (R26.81);Muscle weakness (generalized) (M62.81)     Time: 3846-6599 PT Time Calculation (min) (ACUTE ONLY): 32 min  Charges:  $Gait Training: 8-22 mins $Therapeutic Activity: 8-22 mins                     Aida Raider, PT  Office # [phone] Pager [phone]    Ilda Foil 11/23/2020, 8:37 AM

## 2020-11-23 NOTE — Consult Note (Signed)
Hospital Consult    Reason for Consult: Enlarging AAA on mesenteric duplex Referring Physician: Hospitalist MRN #:  [id number]  History of Present Illness: This is a 87 y.o. female with multiple medical problems including dementia residing in SNF, heart failure, stage III CKD, diabetes,, hypertension, CAD that vascular surgery has been consulted for enlarging AAA noted on mesenteric duplex.  The duplex was reportedly ordered to evaluate some abdominal pain after discussing with my partner Dr. 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  Patient was admitted with acute respiratory failure likely volume overload from her heart failure as well as pneumonia and encephalopathy from her UTI.  The daughter is at bedside and is a great historian.  She states she discussed with Dr. Darrick Penna years ago about not repairing her AAA if this grew.  She is really not interested in surgery given all of her mom's comorbidities and the fact that she has worsening dementia in a nursing home.  [name] denies any abdominal pain at this time.  Eating lunch today.  Past Medical History:  Diagnosis Date   Acute pancreatitis    Anemia    Anxiety and depression    Arthritis    RA   Atrial fibrillation (HCC)    CAD (coronary artery disease)    Carotid artery occlusion    Cerebral infarction (HCC)    CHF (congestive heart failure) (HCC)    CKD (chronic kidney disease)    Dementia (HCC)    Diabetes mellitus age 59   DJD (degenerative joint disease)    DVT (deep venous thrombosis) (HCC)    GERD (gastroesophageal reflux disease)    GI bleed    Hyperlipidemia    Hypertension    Hypothyroid    Joint pain    Mesenteric ischemia    Muscle weakness (generalized)    PVD (peripheral vascular disease) (HCC)    Renal disorder    Stage IV   Rheumatoid arthritis (HCC)    Thyroid disease    Vascular dementia (HCC)     Past Surgical History:   Procedure Laterality Date   APPENDECTOMY     CELIAC ARTERY STENT     CORONARY ARTERY BYPASS GRAFT     EMBOLECTOMY  2009   right leg   FASCIOTOMY     right leg   HIP FRACTURE SURGERY Left    JOINT REPLACEMENT     TOTAL KNEE ARTHROPLASTY     bilateral   TUBAL LIGATION     VISCERAL ANGIOGRAM N/A 05/31/2013   Procedure: MESSENTERIC [name];  Surgeon: [name], MD;  Location: [facility] CATH LAB;  Service: Cardiovascular;  Laterality: N/A;    Allergies  Allergen Reactions   Cefuroxime Anaphylaxis and Other (See Comments)    Throat swelling - MAR- Dtr cannot recall this reaction, just rash    Fish Allergy Shortness Of Breath    Pt said she has throat swelling    Ciprofloxacin Rash    Per MAR   Codeine Other (See Comments)    Jittery - Per MAR   Sertraline Hcl Rash    Per MAR   Cephalexin Rash    Per MAR   Paxil [Paroxetine Hcl] Other (See Comments)    Delirium while in hospital reports of hallucinations.    Prior to Admission medications   Medication Sig Start Date End Date Taking? Authorizing Provider  Calcium Carb-Cholecalciferol 600-800 MG-UNIT TABS TAKE ONE TABLET BY MOUTH 2  TIMES A DAY Patient taking differently: Take 1 tablet by mouth 2 (two) times daily. 09/11/15  Yes Rodolph Bong, MD  Cholecalciferol (VITAMIN D) 125 MCG (5000 UT) CAPS Take 5,000 Units by mouth daily.   Yes [provider]  clotrimazole-betamethasone (LOTRISONE) cream Apply 1 application topically 2 (two) times daily.   Yes [provider]  Cranberry 500 MG CAPS Take 500 mg by mouth daily.    Yes [provider]  dabigatran (PRADAXA) 75 MG CAPS capsule Take 1 capsule (75 mg total) by mouth every 12 (twelve) hours. 05/15/20  Yes Dorcas Carrow, MD  diltiazem (CARDIZEM CD) 180 MG 24 hr capsule Take 1 capsule (180 mg total) by mouth at bedtime. 05/24/17  Yes Rodolph Bong, MD  docusate sodium (COLACE) 100 MG capsule Take 100 mg by mouth 2 (two) times daily.   Yes [provider]  ferrous sulfate 325 (65 FE) MG tablet Take 325 mg by mouth 2 (two) times daily.    Yes [provider]  folic acid (FOLVITE) 1 MG tablet Take 1 tablet (1 mg total) by mouth daily. 05/24/17  Yes Rodolph Bong, MD  guaiFENesin 200 MG tablet Take 200 mg by mouth every 4 (four) hours as needed for to loosen phlegm.   Yes [provider]  HEMORRHOIDAL 0.25-14-74.9 % rectal ointment Place 1 application rectally 2 (two) times daily as needed for hemorrhoids. Hemorrhoid 1% - 12.5% 05/25/20  Yes [provider]  Infant Care Products [facility]) OINT Apply 1 application topically 2 (two) times daily.   Yes [provider]  levETIRAcetam (KEPPRA) 250 MG tablet Take 1 tablet (250 mg total) by mouth at bedtime. 08/27/20  Yes Penumalli, Glenford Bayley, MD  levothyroxine (SYNTHROID) 75 MCG tablet Take 75 mcg by mouth daily before breakfast.   Yes [provider]  loperamide (IMODIUM A-D) 2 MG tablet Take 4 mg by mouth See admin instructions. Take 2 capsules (4mg  totally) by mouth after first loose stool and 2mg  after subsequent stool; do not exceed 16mg  in 24hr   Yes [provider]  memantine (NAMENDA) 10 MG tablet Take 10 mg by mouth daily.   Yes [provider]  methenamine (MANDELAMINE) 0.5 GM tablet Take 1 tablet (500 mg total) by mouth 2 (two) times daily. 07/31/20  Yes Marlin Canary U, DO  metoprolol succinate (TOPROL-XL) 25 MG 24 hr tablet Take 25 mg by mouth daily.   Yes [provider]  mirtazapine (REMERON) 15 MG tablet Take 7.5 mg by mouth at bedtime.   Yes [provider]  omeprazole (PRILOSEC) 20 MG capsule Take 20 mg by mouth daily.   Yes [provider]  ondansetron (ZOFRAN) 4 MG tablet Take 4 mg by mouth every 8 (eight) hours as needed for nausea or vomiting.   Yes [provider]  Podiatric Products (EUCERIN ADVANCED REPAIR FOOT EX) Apply 1 application topically daily. Apply to feet   Yes  [provider]  polyethylene glycol (MIRALAX / GLYCOLAX) 17 g packet Take 17 g by mouth daily.   Yes [provider]  pravastatin (PRAVACHOL) 40 MG tablet TAKE 1 TABLET (40 MG TOTAL) BY MOUTH DAILY. 01/03/18  Yes Rodolph Bong, MD  PREMARIN vaginal cream PLACE 1 APPLICATORFUL VAGINALLY ONCE A DAY Patient taking differently: Place 0.5 g vaginally daily. 12/08/17  Yes Rodolph Bong, MD  Propylene Glycol (SYSTANE COMPLETE) 0.6 % SOLN Place 1 drop into both eyes 4 (four) times daily.  Yes [provider]  Saccharomyces boulardii (PROBIOTIC) 250 MG CAPS Take 250 mg by mouth 2 (two) times daily. 04/06/20  Yes [provider]  torsemide (DEMADEX) 100 MG tablet Take 0.5 tablets (50 mg total) by mouth daily. 07/31/20  Yes Joseph Art, DO  vitamin B-12 (CYANOCOBALAMIN) 1000 MCG tablet Take 1,000 mcg by mouth daily.   Yes [provider]  vitamin C (ASCORBIC ACID) 500 MG tablet Take 500 mg by mouth daily.   Yes [provider]  Ergocalciferol (VITAMIN D2) 2000 units TABS Take 2,000 Units by mouth daily.     [provider]  glucose blood test strip Once daily E11.29 03/03/15   Rodolph Bong, MD    Social History   Socioeconomic History   Marital status: Widowed    Spouse name: Not on file   Number of children: Not on file   Years of education: Not on file   Highest education level: Not on file  Occupational History   Not on file  Tobacco Use   Smoking status: Never   Smokeless tobacco: Never  Vaping Use   Vaping Use: Never used  Substance and Sexual Activity   Alcohol use: No   Drug use: No   Sexual activity: Not on file  Other Topics Concern   Not on file  Social History Narrative   08/17/20 at [facility]   Social Determinants of Health   Financial Resource Strain: Not on file  Food Insecurity: Not on file  Transportation Needs: Not on file  Physical Activity: Not on file  Stress: Not on file   Social Connections: Not on file  Intimate Partner Violence: Not on file     Family History  Problem Relation Age of Onset   Cancer Mother        ovarian   Other Father        suicide   Coronary artery disease Sister    Other Sister        alzheimers   Heart disease Sister        Before age 24   Diabetes Sister    Cancer Daughter        spinal tumor   Diabetes Daughter    Hypertension Daughter    Diabetes Son    Hyperlipidemia Son    Hypertension Son     ROS: [x]  Positive   [ ]  Negative   [ ]  All sytems reviewed and are negative  Cardiovascular: []  chest pain/pressure []  palpitations []  SOB lying flat []  DOE []  pain in legs while walking []  pain in legs at rest []  pain in legs at night []  non-healing ulcers []  hx of DVT []  swelling in legs  Pulmonary: []  productive cough []  asthma/wheezing []  home O2  Neurologic: []  weakness in []  arms []  legs []  numbness in []  arms []  legs []  hx of CVA []  mini stroke [] difficulty speaking or slurred speech []  temporary loss of vision in one eye []  dizziness  Hematologic: []  hx of cancer []  bleeding problems []  problems with blood clotting easily  Endocrine:   []  diabetes []  thyroid disease  GI []  vomiting blood []  blood in stool  GU: []  CKD/renal failure []  HD--[]  M/W/F or []  T/T/S []  burning with urination []  blood in urine  Psychiatric: []  anxiety []  depression  Musculoskeletal: []  arthritis []  joint pain  Integumentary: []  rashes []  ulcers  Constitutional: []  fever []  chills   Physical Examination  Vitals:  11/23/20 0541 11/23/20 1146  BP: 127/81 136/79  Pulse: 84 71  Resp: 18 16  Temp: 98.2 F (36.8 C) 98 F (36.7 C)  SpO2: 93% 96%   Body mass index is 26.52 kg/m.  General:  NAD Gait: Not observed HENT: WNL, normocephalic Pulmonary: normal non-labored breathing Abdomen: soft, NT/ND, no pain or guarding Vascular Exam/Pulses: Left femoral pulse 1+ palpable Right femoral  pulse 2+ palpable Extremities: without ischemic changes, Musculoskeletal: no muscle wasting or atrophy  Neurologic: Pleasant.    CBC    Component Value Date/Time   WBC 6.7 11/23/2020 1047   RBC 3.99 11/23/2020 1047   HGB 12.8 11/23/2020 1047   HCT 38.0 11/23/2020 1047   PLT 186 11/23/2020 1047   MCV 95.2 11/23/2020 1047   MCV 79 05/19/2014 0000   MCH 32.1 11/23/2020 1047   MCHC 33.7 11/23/2020 1047   RDW 14.3 11/23/2020 1047   LYMPHSABS 1.7 11/23/2020 1047   MONOABS 0.5 11/23/2020 1047   EOSABS 0.8 (H) 11/23/2020 1047   BASOSABS 0.1 11/23/2020 1047    BMET    Component Value Date/Time   NA 140 11/23/2020 1047   NA 138 12/12/2017 0000   K 3.5 11/23/2020 1047   CL 105 11/23/2020 1047   CO2 27 11/23/2020 1047   GLUCOSE 181 (H) 11/23/2020 1047   BUN 43 (H) 11/23/2020 1047   BUN 21 12/12/2017 0000   CREATININE 1.73 (H) 11/23/2020 1047   CREATININE 1.49 (H) 09/21/2017 1549   CALCIUM 9.2 11/23/2020 1047   GFRNONAA 28 (L) 11/23/2020 1047   GFRNONAA 32 (L) 09/21/2017 1549   GFRAA 57 (L) 05/19/2019 1734   GFRAA 37 (L) 09/21/2017 1549    COAGS: Lab Results  Component Value Date   INR 1.4 (H) 05/19/2019   INR 2.28 01/17/2018   INR 1.2 12/19/2016     Non-Invasive Vascular Imaging:    Mesenteric duplex today unable to see mesenteric stent.  Largest aortic diameter has increased now 6.7 x 3.3 x 4.4 cm with saccular AAA.   ASSESSMENT/PLAN: This is a 87 y.o. female with multiple comorbidities as noted above that vascular surgery was asked to evaluate for an enlarging AAA.  I have reviewed her mesenteric duplex images and this does suggest saccular aneurysm measuring up to 6.7 cm.  I discussed with the daughter at bedside that current guidelines are to repair these a greater than 5 cm in women.  Discussed we would need better imaging like a CT to fully evaluate the aneurysm including actual diameter and options for repair.  The daughter is very involved in her mom's care and  states that she does not want to pursue AAA repair given her age and comorbidities which I think is very understandable and a reasonable decision.  As it relates to her celiac stent, we could not see this on duplex.  She denies any abdominal pain at the time of my evaluation.  The daughter states has been eating fine this week.  She states she complained of some intermittent abdominal pain last week when she was coughing with her pneumonia but has had no complaints this week or over the weekend.  I will arrange follow-up in 3 months with mesenteric duplex.   Cephus Shelling, MD [facility] Office: [phone]  Cephus Shelling

## 2020-11-23 NOTE — Discharge Summary (Signed)
Physician Discharge Summary  [name] ZOX:[id number] DOB: 10/06/1929 DOA: 11/18/2020  PCP: [name], MD  Admit date: 11/18/2020 Discharge date: 11/23/2020  Admitted From: SNF Disposition: SNF with PT/OT  Recommendations for Outpatient Follow-up:  Follow up with PCP in 1-2 weeks Follow up with Vascular Surgery within 3 months Follow-up with urology in outpatient setting in 1 to 2 weeks discussed maintenance antibiotics for UTIs Follow up with Neurology in 1-2 weeks; per inpatient neurology discussion here Dr. Wilford Corner recommended changing the Keppra to 50 mg every other day for a week and then stopping with close supervision and maintenance of seizure precautions Have Palliative Care continue to follow at the Facility  Please obtain CMP/CBC, Mag, Phos in one week Please follow up on the following pending results:  Home Health: No Equipment/Devices: None    Discharge Condition: Stable  CODE STATUS: DO NOT RESUSCITATE  Diet recommendation: Heart Healthy/Carb Modified   Brief/Interim Summary: The patient is an 87 year old overweight Caucasian female with a past medical history significant for but not limited to dementia, hypothyroidism, CKD stage IIIb, hypertension, hyperlipidemia, diabetes mellitus type 2, atrial fibrillation, history of chronic diastolic CHF, CAD status post CABG, history of DVT, rheumatoid arthritis, history of recurrent UTIs, history of CVA who presented with some increased confusion, hypoxia and cough.  Patient is a poor historian so most of the history is obtained from the chart review and family and she is admitted from [facility] with increased confusion, hypoxia at the facility and ongoing cough.  She has a history of recurrent UTIs and has had a single dose of fosfomycin on 11/10/2020.  At her facility she was reportedly have a saturation around 70% and was 85 in the EMS and improved with 2 L in route.  In the ED her vital signs were significant for elevated  respiratory rate.  Lab work-up showed a BUN/creatinine of 25/1.27.  Troponins were flat but she did have elevated BNP that was elevated to 400.  Urinalysis showed leukocytes and white cells and bacteria and chest x-ray with no acute abnormality.  CTA PE done showed no PE but there was cardiomegaly and trace pulmonary edema and age-indeterminate T6 compression fracture with recommendation for MRI and follow-up.  Antibiotics per pharmacy for UTI were ordered in the ED due to her advanced allergies and recent treatment and will de-escalate and stop IV vancomycin.   Patient continues to cough but chest x-ray today was unrevealing and did not show any evidence of active heart failure or pneumonia.  Likely she has a bronchitis so we we will obtain a respiratory virus panel and start her on a flutter valve, incentive spirometer as well as Xopenex and Atrovent.  She continued to complain about some intermittent abdominal pain so we will obtain a mesenteric ultrasound to evaluate her celiac stent and this is still pending to be done and today it was a technically difficult study so will be reattempted Monday.  Her wheezing seems to be improving with the Xopenex and Atrovent.  She feels okay and Respiratory virus panel was negative so would discontinue droplet precautions.  Repeat imaging consistent with pneumonia so we will continue doxycycline for now continue at discharge.  Renal function worsened so her torsemide was held yesterday and will hold again today and then resume tomorrow.  She underwent a mesenteric ultrasound which was not able based on her celiac stent however it did show her AAA which has gotten significantly larger.  After further discussion with the patient daughter would  not pursue any surgical intervention for this and we will continue monitor in outpatient setting repeat the mesenteric ultrasound in 3 months as an outpatient.  Patient is medically stable to be discharged back to her skilled nursing  facility with PT OT and will need to follow-up with PCP, neurology, and vascular surgeon outpatient setting.  All questions were answered to the patient's daughter satisfaction and she was appreciative of the care.   Discharge Diagnoses:  Principal Problem:   Acute metabolic encephalopathy Active Problems:   Hypothyroidism   ATRIAL FIBRILLATION   Chronic diastolic heart failure (HCC)   CEREBROVASCULAR DISEASE   PERIPHERAL VASCULAR DISEASE   DNR (do not resuscitate)   Rheumatoid arthritis (HCC)   Diabetes mellitus with renal complications (HCC)   Frequent UTI   CKD (chronic kidney disease) stage 3, GFR 30-59 ml/min (HCC)   CAD (coronary artery disease)   Hyperlipidemia   Hypertension   Vascular dementia, uncomplicated (HCC)  Acute Encephalopathy in the setting of UTI and Hypoxia from pneumonia, improving UTI Recurrent UTI -Metabolic encephalopathy suspected early due to UTI. -Has history of recurrent UTI had previously been on methenamine. > Recently treated for UTI about a week ago with fosfomycin.  Has presented similarly with UTI in the past.  Does have leukocytes, white cells and bacteria in urine. -Pharmacy consulted for antibiotics in the ED due to patient's recent antibiotics as well as allergies. - No fever or leukocytosis in the ED.  Urine studies ordered unfortunately urine culture was not sent until after she had received antibiotics -Urinalysis showed straw-colored urine with negative glucose, negative hemoglobin, negative ketones, large leukocytes, rare bacteria, 0-5 squamous epithelial cells and greater than 50 WBCs; Repeat Cx showed <10,000 CFU  -Continue with antibiotics per pharmacy and will stop IV Vanc and now stop IV and change and then changed to p.o. doxycycline -Trend fever curve and white count -Continue with PT OT and they are recommending SNF but patient's daughter does not want her going back to the SNF facility at [facility] but wants her to go back to her  room with PT and OT and she will need SNF with PT OT -She will need to see Urology in the outpatient settings to discuss with them about Recurrent UTI's    Acute Respiratory Failure with Hypoxia volume overload likely from acute on chronic diastolic CHF versus Bilateral Basilar PNA vs Bronchitis but likely secondary to pneumonia pneumonia >Initially required 2 L in the ED.   -However she has been weaned off this in the ED without significant intervention.   -SpO2: 96 % O2 Flow Rate (L/min): 2 L/min -Continuous pulse oximetry maintain O2 saturations greater than 90% -Continue supplemental oxygen via nasal cannula and wean to room air -Continue monitor respiratory status carefully -Check Respiratory Virus Panel and place on Droplet but this was negative so we will discontinue droplet precautions -C/w Xopenex/Atrovent -C/w Doxycycline as above continue at discharge -Flutter Valve and Incentive Spirometry; have asked nursing to provide this for her again -Antitussives with benzonatate and Robitussin-DM at discharge -We will continue to monitor and consider diuresis if recurs as below. -Chest x-ray yesterday showed "Stable bilateral patchy pulmonary opacities worrisome for pneumonia. Recommend follow-up to resolution" -CXR today showed "Partial improvement in the ill-defined patchy peripheral airspace opacities seen previously."   Acute on Chronic Diastolic CHF, improved > History of diastolic heart failure.   -Last Echo in our system on February of this year with EF 55-60%, hypertrophy with indeterminate diastolic parameters as well  as normal RV function > Mildly elevated BNP to 400.  No significant edema CT scan showed some volume overload.  -Hypoxia as above has resolved in the ED. rales noted on exam. - Low dose IV lasix and given 20 mg x 1 -Strict I's and O's and daily weights; she is -1464.2 mL -We will hold her torsemide again given that renal function worsened  -Continue to monitor  for signs and symptoms of volume overload -Repeat chest x-ray in the a.m. as above and will need ambulatory home O2 screen prior to discharge -Resume Torsemide 50 mg po Daily 8/16   Dementia -Continue home Namenda   ?Abdominal Pain -Patient has Dementia and on exam she appeared to be nontender did not grimace but could not tell me if she was having abdominal pain or not -Discussed with Dr. Valorie Roosevelt about  obtaining vascular imaging given her mesenteric, and celiac stenosis and stenting getting a mesenteric ultrasound while she is in the hospital and unfortunately the sonographer was unable to insonate the celiac artery, SMA, IMA, CHA or proximal aorta due to overlying bowel gas.  She did have the largest aortic diameter being 6.7 x 3.3 x 4.4 cm saccular AAA with internal echoes at the distal aorta which was increased when compared to the prior study -Case was discussed with vascular surgery Dr. Chestine Spore who evaluated and recommend outpatient 3 months follow-up with mesenteric duplex at that time   T6 Compression Fracture  > CT scan showed signs of age-indeterminate compression fracture.  Patient does have some pain there -Recommending MRI for follow-up and I spoke to the patient daughter about this and we also consulted IR.  IR would like an MRI before possible KP or VP but patient's Daughter to decide whether she wants to pursue an daughter states that she does not want to pursue an MRI or vertebroplasty or kyphoplasty -Obtain PT and OT and recommendation is to go back to SNF with PT and OT   Hypothyroidism -Check TSH in the AM  -Continue home Synthroid  GERD -Continue PPI with pantoprazole 40 mg po Daily and will increase to twice daily dosing given cough   CKD 3B > Creatinine stable in the ED. BUNs/creatinine is now 27/1.30 -> 35/1.65 -> 45/1.75 and worsened to 48/1.90 yesterday and is now improved to 43/1.73 -Will hold her torsemide 50 mg p.o. daily for now and resume in the next 24 to 48  hours - Avoid nephrotoxic agents, contrast dyes, hypotension renally dose medications - Trend renal function and electrolytes - Repeat CMP in the AM    Hypertension > Blood pressure stable in the ED - Continue home diltiazem, metoprolol -Continue monitor blood pressures per protocol -Last BP was 136/79   Diabetes - SSI -CBGs ranging from 118-145 -Resume home regimen at discharge   A. Fib -Continue home Diltiazem, metoprolol, Pradaxa -Continue to Monitor on Telemetry   CAD status post CABG - Continue home metoprolol, statin   History of DVT -On chronic Pradaxa as above.   Rheumatoid Arthritis  -Had previously been on methotrexate however is not currently taking this.   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 Clotilde Dieter who recommends the patient not a surgical candidate for  AAA repair given her age and comorbidities as well as dementia and recommends just conservative management and following up in the outpatient setting 3 months and daughter is agreeable with this   Seizure disorder as a result of stroke -Continue Home Keppra at 250 mg po qHS -Daughter was concerned with drowsiness it causes but per my discussion with Neurology Dr. Wilford Corner "If there is significant concern for drowsiness with Keppra, change it to once every other day for a week and discontinue.  Please follow-up with your outpatient neurologist in the next 2 weeks.  Maintain seizure precautions" -Will discuss Neurology Recc's with family and daughter is updated and she will discuss with her primary neurologist about doing this in outpatient setting    Discharge Instructions  Discharge Instructions     Call MD for:   difficulty breathing, headache or visual disturbances   Complete by: As directed    Call MD for:  extreme fatigue   Complete by: As directed    Call MD for:  hives   Complete by: As directed    Call MD for:  persistant dizziness or light-headedness   Complete by: As directed    Call MD for:  persistant nausea and vomiting   Complete by: As directed    Call MD for:  redness, tenderness, or signs of infection (pain, swelling, redness, odor or green/yellow discharge around incision site)   Complete by: As directed    Call MD for:  severe uncontrolled pain   Complete by: As directed    Call MD for:  temperature >100.4   Complete by: As directed    Diet - low sodium heart healthy   Complete by: As directed    Diet Carb Modified   Complete by: As directed    Discharge instructions   Complete by: As directed    You were cared for by a hospitalist during your hospital stay. If you have any questions about your discharge medications or the care you received while you were in the hospital after you are discharged, you can call the unit and ask to speak with the hospitalist on call if the hospitalist that took care of you is not available. Once you are discharged, your primary care physician will handle any further medical issues. Please note that NO REFILLS for any discharge medications will be authorized once you are discharged, as it is imperative that you return to your primary care physician (or establish a relationship with a primary care physician if you do not have one) for your aftercare needs so that they can reassess your need for medications and monitor your lab values.  Follow up with PCP, Neurology, Vascular Surgery and Urology in the outpatient setting. Take all medications as prescribed. If symptoms change or worsen please return to the ED for evaluation   Increase activity slowly   Complete by: As directed       Allergies as of 11/23/2020       Reactions   Cefuroxime  Anaphylaxis, Other (See Comments)   Throat swelling - MAR- Dtr cannot recall this reaction, just rash    Fish Allergy Shortness Of Breath   Pt said she has throat swelling    Ciprofloxacin Rash   Per MAR   Codeine Other (See Comments)   Jittery - Per MAR   Sertraline Hcl Rash   Per MAR   Cephalexin Rash   Per MAR   Paxil [paroxetine Hcl] Other (See Comments)   Delirium while in hospital  reports of hallucinations.        Medication List     STOP taking these medications    guaiFENesin 200 MG tablet   omeprazole 20 MG capsule Commonly known as: PRILOSEC Replaced by: pantoprazole 40 MG tablet       TAKE these medications    acetaminophen 325 MG tablet Commonly known as: TYLENOL Take 2 tablets (650 mg total) by mouth every 6 (six) hours as needed for mild pain (or Fever >/= 101).   benzonatate 100 MG capsule Commonly known as: TESSALON Take 1 capsule (100 mg total) by mouth 3 (three) times daily as needed for cough.   Calcium Carb-Cholecalciferol 600-800 MG-UNIT Tabs TAKE ONE TABLET BY MOUTH 2 TIMES A DAY   clotrimazole-betamethasone cream Commonly known as: LOTRISONE Apply 1 application topically 2 (two) times daily.   Cranberry 500 MG Caps Take 500 mg by mouth daily.   dabigatran 75 MG Caps capsule Commonly known as: PRADAXA Take 1 capsule (75 mg total) by mouth every 12 (twelve) hours.   Dermacloud Oint Apply 1 application topically 2 (two) times daily.   diltiazem 180 MG 24 hr capsule Commonly known as: CARDIZEM CD Take 1 capsule (180 mg total) by mouth at bedtime.   docusate sodium 100 MG capsule Commonly known as: COLACE Take 100 mg by mouth 2 (two) times daily.   doxycycline 100 MG tablet Commonly known as: VIBRA-TABS Take 1 tablet (100 mg total) by mouth every 12 (twelve) hours for 2 days.   EUCERIN ADVANCED REPAIR FOOT EX Apply 1 application topically daily. Apply to feet   ferrous sulfate 325 (65 FE) MG tablet Take 325 mg by mouth 2  (two) times daily.   folic acid 1 MG tablet Commonly known as: FOLVITE Take 1 tablet (1 mg total) by mouth daily.   glucose blood test strip Once daily E11.29   guaiFENesin-dextromethorphan 100-10 MG/5ML syrup Commonly known as: ROBITUSSIN DM Take 5 mLs by mouth every 4 (four) hours as needed for cough.   Hemorrhoidal 0.25-14-74.9 % rectal ointment Generic drug: phenylephrine-shark liver oil-mineral oil-petrolatum Place 1 application rectally 2 (two) times daily as needed for hemorrhoids. Hemorrhoid 1% - 12.5%   levETIRAcetam 250 MG tablet Commonly known as: Keppra Take 1 tablet (250 mg total) by mouth at bedtime.   levothyroxine 75 MCG tablet Commonly known as: SYNTHROID Take 75 mcg by mouth daily before breakfast.   loperamide 2 MG tablet Commonly known as: IMODIUM A-D Take 4 mg by mouth See admin instructions. Take 2 capsules (4mg  totally) by mouth after first loose stool and 2mg  after subsequent stool; do not exceed 16mg  in 24hr   memantine 10 MG tablet Commonly known as: NAMENDA Take 10 mg by mouth daily.   methenamine 0.5 GM tablet Commonly known as: MANDELAMINE Take 1 tablet (500 mg total) by mouth 2 (two) times daily.   metoprolol succinate 25 MG 24 hr tablet Commonly known as: TOPROL-XL Take 25 mg by mouth daily.   mirtazapine 15 MG tablet Commonly known as: REMERON Take 7.5 mg by mouth at bedtime.   ondansetron 4 MG tablet Commonly known as: ZOFRAN Take 4 mg by mouth every 8 (eight) hours as needed for nausea or vomiting.   pantoprazole 40 MG tablet Commonly known as: PROTONIX Take 1 tablet (40 mg total) by mouth 2 (two) times daily. Replaces: omeprazole 20 MG capsule   polyethylene glycol 17 g packet Commonly known as: MIRALAX / GLYCOLAX Take 17 g by mouth daily.   pravastatin  40 MG tablet Commonly known as: PRAVACHOL TAKE 1 TABLET (40 MG TOTAL) BY MOUTH DAILY.   Premarin vaginal cream Generic drug: conjugated estrogens PLACE 1 APPLICATORFUL  VAGINALLY ONCE A DAY What changed: See the new instructions.   Probiotic 250 MG Caps Take 250 mg by mouth 2 (two) times daily.   Systane Complete 0.6 % Soln Generic drug: Propylene Glycol Place 1 drop into both eyes 4 (four) times daily.   torsemide 100 MG tablet Commonly known as: DEMADEX Take 0.5 tablets (50 mg total) by mouth daily.   vitamin B-12 1000 MCG tablet Commonly known as: CYANOCOBALAMIN Take 1,000 mcg by mouth daily.   vitamin C 500 MG tablet Commonly known as: ASCORBIC ACID Take 500 mg by mouth daily.   Vitamin D 125 MCG (5000 UT) Caps Take 5,000 Units by mouth daily.   Vitamin D2 50 MCG (2000 UT) Tabs Take 2,000 Units by mouth daily.        Contact information for after-discharge care     Destination     [facility] Preferred SNF .   Service: Skilled Nursing Contact information: [address] [place] [address] [phone]                    Allergies  Allergen Reactions   Cefuroxime Anaphylaxis and Other (See Comments)    Throat swelling - MAR- Dtr cannot recall this reaction, just rash    Fish Allergy Shortness Of Breath    Pt said she has throat swelling    Ciprofloxacin Rash    Per MAR   Codeine Other (See Comments)    Jittery - Per MAR   Sertraline Hcl Rash    Per MAR   Cephalexin Rash    Per MAR   Paxil [Paroxetine Hcl] Other (See Comments)    Delirium while in hospital reports of hallucinations.   Consultations: Discussed with Neurology Dr. Wilford Corner Vascular Surgery Dr. Chestine Spore  Procedures/Studies: CT Angio Chest PE W/Cm &/Or Wo Cm  Result Date: 11/18/2020 CLINICAL DATA:  87 year old female with cough, shortness of breath, hypoxia, concern for pulmonary embolism. EXAM: CT ANGIOGRAPHY CHEST WITH CONTRAST TECHNIQUE: Multidetector CT imaging of the chest was performed using the standard protocol during bolus administration of intravenous contrast. Multiplanar CT image reconstructions and  MIPs were obtained to evaluate the vascular anatomy. CONTRAST:  Sixty mL Omnipaque 350, intravenous COMPARISON:  04/29/2008 FINDINGS: Cardiovascular: Satisfactory opacification of the pulmonary arteries to the segmental level. No evidence of pulmonary embolism. Severe biatrial cardiomegaly. Severe mitral annular calcifications. Aortic valvular calcifications are present. Severe native coronary atherosclerotic calcifications. Postsurgical changes after coronary artery bypass graft. Scattered atherosclerotic calcifications of the normal caliber thoracic aorta. No pericardial effusion. Mediastinum/Nodes: No enlarged mediastinal, hilar, or axillary lymph nodes. Thyroid gland, trachea, and esophagus demonstrate no significant findings. Lungs/Pleura: Minimal basal and dependent interlobular septal thickening. No overt pulmonary edema. No pleural effusions or pneumothorax. No focal consolidations. No suspicious pulmonary nodules. Upper Abdomen: The visualized upper abdomen is within normal limits. Musculoskeletal: Sigmoid curvature of the thoracic spine. Age-indeterminate anterior wedge compression deformity of the T6 vertebral body. No retropulsion. Status post median sternotomy. Review of the MIP images confirms the above findings. IMPRESSION: Vascular: 1. No evidence of pulmonary embolism. 2. Severe biatrial cardiomegaly. 3. Coronary and aortic atherosclerosis (ICD10-I70.0). Non-Vascular: 1. Trace pulmonary edema, likely cardiogenic. 2. Age-indeterminate anterior wedge compression deformity of the T6 vertebral body, no retropulsion. If there is clinical concern for acuity, recommend thoracic spine MRI further  evaluation. Marliss Coots, MD [facility]iology Electronically Signed   By: Marliss Coots MD   On: 11/18/2020 16:26   DG CHEST PORT 1 VIEW  Result Date: 11/23/2020 CLINICAL DATA:  SOB (shortness of breath) shortness of breath EXAM: PORTABLE CHEST - 1 VIEW  COMPARISON:  the previous day's study FINDINGS: Coarse bilateral interstitial markings. Some improvement in the peripheral ill-defined airspace opacities seen previously. Heart size and mediastinal contours are within normal limits. Previous CABG. No effusion.  No pneumothorax. Sternotomy wires.  Bilateral shoulder DJD. IMPRESSION: Partial improvement in the ill-defined patchy peripheral airspace opacities seen previously Electronically Signed   By: Corlis Leak M.D.   On: 11/23/2020 07:34   DG CHEST PORT 1 VIEW  Result Date: 11/22/2020 CLINICAL DATA:  Shortness of breath EXAM: PORTABLE CHEST 1 VIEW COMPARISON:  November 21, 2020 FINDINGS: Bilateral patchy pulmonary opacities. The cardiomediastinal silhouette is stable. No pneumothorax. No other abnormalities or changes. IMPRESSION: Stable bilateral patchy pulmonary opacities worrisome for pneumonia. Recommend follow-up to resolution. Electronically Signed   By: Gerome Sam III M.D.   On: 11/22/2020 09:36   DG CHEST PORT 1 VIEW  Result Date: 11/21/2020 CLINICAL DATA:  Shortness of breath. Evaluate atelectasis or scarring. Pneumonia or effusion. EXAM: PORTABLE CHEST 1 VIEW COMPARISON:  November 20, 2020 FINDINGS: Cardiomegaly. The hila and mediastinum are unchanged. No pneumothorax. Bilateral pulmonary opacities the large interstitial component but more focal opacities in the bases. IMPRESSION: 1. Bilateral pulmonary opacities. The opacities are nonspecific but an infectious process is favored due to the more focal opacities in the bases. Recommend attention on follow-up. Electronically Signed   By: Gerome Sam III M.D.   On: 11/21/2020 13:35   DG CHEST PORT 1 VIEW  Result Date: 11/20/2020 CLINICAL DATA:  Short of breath.  History of CHF. EXAM: PORTABLE CHEST 1 VIEW COMPARISON:  Chest x-ray and CT chest 11/18/2020 FINDINGS: Postop CABG. Negative for heart failure or edema. Prominent lung markings most consistent with chronic lung disease. Mild left  lower lobe atelectasis or scarring. Negative for pneumonia or effusion. Degenerative change in both shoulders. IMPRESSION: Negative for heart failure or pneumonia. Mild left lower lobe atelectasis. Suspect underlying chronic lung disease. Electronically Signed   By: Marlan Palau M.D.   On: 11/20/2020 08:40   DG Chest Port 1 View  Result Date: 11/18/2020 CLINICAL DATA:  Cough and hypoxia. EXAM: PORTABLE CHEST 1 VIEW COMPARISON:  05/19/2019 FINDINGS: Previous median sternotomy and CABG procedure. Stable cardiomediastinal contours. No signs of pleural effusion or edema. No airspace consolidation. The visualized osseous structures are unremarkable. IMPRESSION: No acute cardiopulmonary abnormalities. Electronically Signed   By: Signa Kell M.D.   On: 11/18/2020 14:42   VAS [place] MESENTERIC  Result Date: 11/23/2020 ABDOMINAL VISCERAL Patient Name:  [name]  Date of Exam:   11/21/2020 Medical Rec #: [id number]        Accession #:    [id number] Date of Birth: 11-Sep-1929       Patient Gender: F Patient Age:   41 years Exam Location:  [facility] Procedure:      VAS [place] MESENTERIC Referring Phys: [id number] Kateri Mc LATIF [facility] -------------------------------------------------------------------------------- Indications: Abdominal pain, history of SMA occlusion and Celiac artery              stenting. Limitations: Air/bowel gas. Comparison Study: 06/15/2017 Mesenteric artery duplex- patent celiac artery stent,  AAA measuring 3.4cm. Performing Technologist: Gertie Fey MHA, RDMS, RVT, RDCS  Examination Guidelines: A complete evaluation includes B-mode imaging, spectral Doppler, color Doppler, and power Doppler as needed of all accessible portions of each vessel. Bilateral testing is considered an integral part of a complete examination. Limited examinations for reoccurring indications may be performed as noted.  Duplex Findings:  +----------------------+--------+--------+------+-------------------+ Mesenteric            PSV cm/sEDV cm/sPlaque     Comments       +----------------------+--------+--------+------+-------------------+ Aorta Prox                                  Unable to insonate  +----------------------+--------+--------+------+-------------------+ Aorta Mid                36                        1.5cm        +----------------------+--------+--------+------+-------------------+ Aorta Distal            114                 1.2cm distal to AAA +----------------------+--------+--------+------+-------------------+ Celiac Artery Origin                        Unable to insonate  +----------------------+--------+--------+------+-------------------+ Celiac Artery Proximal                      Unable to insonate  +----------------------+--------+--------+------+-------------------+ SMA Origin                                  Unable to insonate  +----------------------+--------+--------+------+-------------------+ SMA Proximal                                Unable to insonate  +----------------------+--------+--------+------+-------------------+ SMA Mid                                     Unable to insonate  +----------------------+--------+--------+------+-------------------+ SMA Distal                                  Unable to insonate  +----------------------+--------+--------+------+-------------------+ CHA                                         Unable to insonate  +----------------------+--------+--------+------+-------------------+ IMA                                         Unable to insonate  +----------------------+--------+--------+------+-------------------+  Mesenteric Technologist observations: AAA measuring 6.7 x 3.3 x 4.4cm.    Summary: Largest Aortic Diameter: 6.7 x 3.3 x 4.4cm saccular AAA with internal echoes at the distal aorta. This has  increased in size when compared to prior study.  Mesenteric: Unable to insonate celiac artery, SMA, IMA, CHA, or proximal aorta due to overlying bowel gas.  *See table(s) above for measurements and observations.  Preliminary      Subjective: Seen and examined at bedside and she is awake and alert and in no acute distress.  Denies any cough today and not short of breath.  Feels okay.  No other concerns or complaints at this time and denies any pain   Discharge Exam: Vitals:   11/23/20 0541 11/23/20 1146  BP: 127/81 136/79  Pulse: 84 71  Resp: 18 16  Temp: 98.2 F (36.8 C) 98 F (36.7 C)  SpO2: 93% 96%   Vitals:   11/22/20 2141 11/23/20 0103 11/23/20 0541 11/23/20 1146  BP: (!) 144/52 (!) 149/57 127/81 136/79  Pulse: 77 79 84 71  Resp: 18 16 18 16   Temp: 98.1 F (36.7 C) 98 F (36.7 C) 98.2 F (36.8 C) 98 F (36.7 C)  TempSrc: Oral Oral Oral Oral  SpO2: 96% 94% 93% 96%  Weight:      Height:       General: Pt is alert, awake, not in acute distress Cardiovascular: RRR, S1/S2 +, no rubs, no gallops Respiratory: Diminished bilaterally, no wheezing, no rhonchi; unlabored breathing and not wearing supplemental oxygen via nasal cannula Abdominal: Soft, NT, distended secondary body habitus, bowel sounds + Extremities: no edema, no cyanosis  The results of significant diagnostics from this hospitalization (including imaging, microbiology, ancillary and laboratory) are listed below for reference.    Microbiology: Recent Results (from the past 240 hour(s))  Resp Panel by RT-PCR (Flu A&B, Covid) Nasopharyngeal Swab     Status: None   Collection Time: 11/18/20  1:48 PM   Specimen: Nasopharyngeal Swab; Nasopharyngeal(NP) swabs in vial transport medium  Result Value Ref Range Status   SARS Coronavirus 2 by RT PCR NEGATIVE NEGATIVE Final    Comment: (NOTE) SARS-CoV-2 target nucleic acids are NOT DETECTED.  The SARS-CoV-2 RNA is generally detectable in upper respiratory specimens  during the acute phase of infection. The lowest concentration of SARS-CoV-2 viral copies this assay can detect is 138 copies/mL. A negative result does not preclude SARS-Cov-2 infection and should not be used as the sole basis for treatment or other patient management decisions. A negative result may occur with  improper specimen collection/handling, submission of specimen other than nasopharyngeal swab, presence of viral mutation(s) within the areas targeted by this assay, and inadequate number of viral copies(<138 copies/mL). A negative result must be combined with clinical observations, patient history, and epidemiological information. The expected result is Negative.  Fact Sheet for Patients:  BloggerCourse.com  Fact Sheet for Healthcare Providers:  SeriousBroker.it  This test is no t yet approved or cleared by the [place] FDA and  has been authorized for detection and/or diagnosis of SARS-CoV-2 by FDA under an Emergency Use Authorization (EUA). This EUA will remain  in effect (meaning this test can be used) for the duration of the COVID-19 declaration under Section 564(b)(1) of the Act, 21 U.S.C.section 360bbb-3(b)(1), unless the authorization is terminated  or revoked sooner.       Influenza A by PCR NEGATIVE NEGATIVE Final   Influenza B by PCR NEGATIVE NEGATIVE Final    Comment: (NOTE) The Xpert Xpress SARS-CoV-2/FLU/RSV plus assay is intended as an aid in the diagnosis of influenza from Nasopharyngeal swab specimens and should not be used as a sole basis for treatment. Nasal washings and aspirates are unacceptable for Xpert Xpress SARS-CoV-2/FLU/RSV testing.  Fact Sheet for Patients: BloggerCourse.com  Fact Sheet for Healthcare Providers: SeriousBroker.it  This test is not yet approved or cleared by the [place] FDA  and has been authorized for detection  and/or diagnosis of SARS-CoV-2 by FDA under an Emergency Use Authorization (EUA). This EUA will remain in effect (meaning this test can be used) for the duration of the COVID-19 declaration under Section 564(b)(1) of the Act, 21 U.S.C. section 360bbb-3(b)(1), unless the authorization is terminated or revoked.  Performed at [facility], [address] [place], [place] [address]   Urine Culture     Status: Abnormal   Collection Time: 11/19/20 12:18 PM   Specimen: Urine, Clean Catch  Result Value Ref Range Status   Specimen Description URINE, CLEAN CATCH  Final   Special Requests NONE  Final   Culture (A)  Final    <10,000 COLONIES/mL INSIGNIFICANT GROWTH Performed at [facility], [address], [place], [place] [address]    Report Status 11/20/2020 FINAL  Final  Respiratory (~20 pathogens) panel by PCR     Status: None   Collection Time: 11/20/20 12:26 PM   Specimen: Nasopharyngeal Swab; Respiratory  Result Value Ref Range Status   Adenovirus NOT DETECTED NOT DETECTED Final   Coronavirus 229E NOT DETECTED NOT DETECTED Final    Comment: (NOTE) The Coronavirus on the Respiratory Panel, DOES NOT test for the novel  Coronavirus (2019 nCoV)    Coronavirus HKU1 NOT DETECTED NOT DETECTED Final   Coronavirus NL63 NOT DETECTED NOT DETECTED Final   Coronavirus OC43 NOT DETECTED NOT DETECTED Final   Metapneumovirus NOT DETECTED NOT DETECTED Final   Rhinovirus / Enterovirus NOT DETECTED NOT DETECTED Final   Influenza A NOT DETECTED NOT DETECTED Final   Influenza B NOT DETECTED NOT DETECTED Final   Parainfluenza Virus 1 NOT DETECTED NOT DETECTED Final   Parainfluenza Virus 2 NOT DETECTED NOT DETECTED Final   Parainfluenza Virus 3 NOT DETECTED NOT DETECTED Final   Parainfluenza Virus 4 NOT DETECTED NOT DETECTED Final   Respiratory Syncytial Virus NOT DETECTED NOT DETECTED Final   Bordetella pertussis NOT DETECTED NOT DETECTED Final   Bordetella Parapertussis NOT DETECTED  NOT DETECTED Final   Chlamydophila pneumoniae NOT DETECTED NOT DETECTED Final   Mycoplasma pneumoniae NOT DETECTED NOT DETECTED Final    Comment: Performed at [facility], [address] [place], [place] [address]  Resp Panel by RT-PCR (Flu A&B, Covid) Nasopharyngeal Swab     Status: None   Collection Time: 11/23/20 11:52 AM   Specimen: Nasopharyngeal Swab; Nasopharyngeal(NP) swabs in vial transport medium  Result Value Ref Range Status   SARS Coronavirus 2 by RT PCR NEGATIVE NEGATIVE Final    Comment: (NOTE) SARS-CoV-2 target nucleic acids are NOT DETECTED.  The SARS-CoV-2 RNA is generally detectable in upper respiratory specimens during the acute phase of infection. The lowest concentration of SARS-CoV-2 viral copies this assay can detect is 138 copies/mL. A negative result does not preclude SARS-Cov-2 infection and should not be used as the sole basis for treatment or other patient management decisions. A negative result may occur with  improper specimen collection/handling, submission of specimen other than nasopharyngeal swab, presence of viral mutation(s) within the areas targeted by this assay, and inadequate number of viral copies(<138 copies/mL). A negative result must be combined with clinical observations, patient history, and epidemiological information. The expected result is Negative.  Fact Sheet for Patients:  BloggerCourse.com  Fact Sheet for Healthcare Providers:  SeriousBroker.it  This test is no t yet approved or cleared by the [place] FDA and  has been authorized for detection and/or diagnosis of SARS-CoV-2  by FDA under an Emergency Use Authorization (EUA). This EUA will remain  in effect (meaning this test can be used) for the duration of the COVID-19 declaration under Section 564(b)(1) of the Act, 21 U.S.C.section 360bbb-3(b)(1), unless the authorization is terminated  or revoked sooner.        Influenza A by PCR NEGATIVE NEGATIVE Final   Influenza B by PCR NEGATIVE NEGATIVE Final    Comment: (NOTE) The Xpert Xpress SARS-CoV-2/FLU/RSV plus assay is intended as an aid in the diagnosis of influenza from Nasopharyngeal swab specimens and should not be used as a sole basis for treatment. Nasal washings and aspirates are unacceptable for Xpert Xpress SARS-CoV-2/FLU/RSV testing.  Fact Sheet for Patients: BloggerCourse.com  Fact Sheet for Healthcare Providers: SeriousBroker.it  This test is not yet approved or cleared by the [place] FDA and has been authorized for detection and/or diagnosis of SARS-CoV-2 by FDA under an Emergency Use Authorization (EUA). This EUA will remain in effect (meaning this test can be used) for the duration of the COVID-19 declaration under Section 564(b)(1) of the Act, 21 U.S.C. section 360bbb-3(b)(1), unless the authorization is terminated or revoked.  Performed at [facility], [address] [place], [place] [address]     Labs: BNP (last 3 results) Recent Labs    07/29/20 1137 11/18/20 1250  BNP 454.7* 400.7*   Basic Metabolic Panel: Recent Labs  Lab 11/19/20 0304 11/20/20 0403 11/21/20 0425 11/22/20 0432 11/23/20 1047  NA 140 137 136 138 140  K 4.0 3.7 3.9 3.7 3.5  CL 102 99 100 104 105  CO2 GLUCOSE 123* 120* 119* 120* 181*  BUN 27* 35* 45* 48* 43*  CREATININE 1.30* 1.65* 1.75* 1.90* 1.73*  CALCIUM 9.6 9.6 9.4 9.3 9.2  MG 1.9 1.9 1.9 2.0 2.1  PHOS  --  4.0 3.7 4.1 2.9   Liver Function Tests: Recent Labs  Lab 11/19/20 0304 11/20/20 0403 11/21/20 0425 11/22/20 0432 11/23/20 1047  AST ALT ALKPHOS 61 61 59 56 58  BILITOT 1.0 0.7 1.0 1.2 1.0  PROT 7.8 7.6 7.1 7.7 7.7  ALBUMIN 2.9* 2.8* 2.7* 2.8* 2.8*   No results for input(s): LIPASE, AMYLASE in the last 168 hours. No results for input(s): AMMONIA in the last  168 hours. CBC: Recent Labs  Lab 11/18/20 1250 11/19/20 0304 11/20/20 0403 11/21/20 0425 11/22/20 0432 11/23/20 1047  WBC 9.6 8.8 7.9 8.1 7.8 6.7  NEUTROABS 5.4  --  3.9 4.9 4.0 3.5  HGB 13.6 13.5 12.6 12.1 13.0 12.8  HCT 43.7 42.1 38.7 35.3* 38.9 38.0  MCV 100.2* 99.1 96.3 93.6 95.8 95.2  PLT 165 146* 150 163 170 186   Cardiac Enzymes: No results for input(s): CKTOTAL, CKMB, CKMBINDEX, TROPONINI in the last 168 hours. BNP: Invalid input(s): POCBNP CBG: Recent Labs  Lab 11/22/20 0623 11/22/20 1117 11/22/20 1558 11/23/20 0638 11/23/20 1150  GLUCAP 118* 143* 145* 119* 237*   D-Dimer No results for input(s): DDIMER in the last 72 hours. Hgb A1c No results for input(s): HGBA1C in the last 72 hours. Lipid Profile No results for input(s): CHOL, HDL, LDLCALC, TRIG, CHOLHDL, LDLDIRECT in the last 72 hours. Thyroid function studies Recent Labs    11/23/20 1047  TSH 1.740   Anemia work up No results for input(s): VITAMINB12, FOLATE, FERRITIN, TIBC, IRON, RETICCTPCT in the last 72 hours. Urinalysis    Component Value  Date/Time   COLORURINE STRAW (A) 11/18/2020 1701   APPEARANCEUR CLEAR 11/18/2020 1701   LABSPEC 1.012 11/18/2020 1701   PHURINE 5.0 11/18/2020 1701   GLUCOSEU NEGATIVE 11/18/2020 1701   GLUCOSEU NEG mg/dL 16/01/9603 5409   HGBUR NEGATIVE 11/18/2020 1701   BILIRUBINUR NEGATIVE 11/18/2020 1701   BILIRUBINUR NEGATIUVE 01/15/2018 1428   KETONESUR NEGATIVE 11/18/2020 1701   PROTEINUR NEGATIVE 11/18/2020 1701   UROBILINOGEN 1.0 01/15/2018 1428   UROBILINOGEN 0.2 03/07/2010 1517   NITRITE NEGATIVE 11/18/2020 1701   LEUKOCYTESUR LARGE (A) 11/18/2020 1701   Sepsis Labs Invalid input(s): PROCALCITONIN,  WBC,  LACTICIDVEN Microbiology Recent Results (from the past 240 hour(s))  Resp Panel by RT-PCR (Flu A&B, Covid) Nasopharyngeal Swab     Status: None   Collection Time: 11/18/20  1:48 PM   Specimen: Nasopharyngeal Swab; Nasopharyngeal(NP) swabs in vial  transport medium  Result Value Ref Range Status   SARS Coronavirus 2 by RT PCR NEGATIVE NEGATIVE Final    Comment: (NOTE) SARS-CoV-2 target nucleic acids are NOT DETECTED.  The SARS-CoV-2 RNA is generally detectable in upper respiratory specimens during the acute phase of infection. The lowest concentration of SARS-CoV-2 viral copies this assay can detect is 138 copies/mL. A negative result does not preclude SARS-Cov-2 infection and should not be used as the sole basis for treatment or other patient management decisions. A negative result may occur with  improper specimen collection/handling, submission of specimen other than nasopharyngeal swab, presence of viral mutation(s) within the areas targeted by this assay, and inadequate number of viral copies(<138 copies/mL). A negative result must be combined with clinical observations, patient history, and epidemiological information. The expected result is Negative.  Fact Sheet for Patients:  BloggerCourse.com  Fact Sheet for Healthcare Providers:  SeriousBroker.it  This test is no t yet approved or cleared by the [place] FDA and  has been authorized for detection and/or diagnosis of SARS-CoV-2 by FDA under an Emergency Use Authorization (EUA). This EUA will remain  in effect (meaning this test can be used) for the duration of the COVID-19 declaration under Section 564(b)(1) of the Act, 21 U.S.C.section 360bbb-3(b)(1), unless the authorization is terminated  or revoked sooner.       Influenza A by PCR NEGATIVE NEGATIVE Final   Influenza B by PCR NEGATIVE NEGATIVE Final    Comment: (NOTE) The Xpert Xpress SARS-CoV-2/FLU/RSV plus assay is intended as an aid in the diagnosis of influenza from Nasopharyngeal swab specimens and should not be used as a sole basis for treatment. Nasal washings and aspirates are unacceptable for Xpert Xpress SARS-CoV-2/FLU/RSV testing.  Fact  Sheet for Patients: BloggerCourse.com  Fact Sheet for Healthcare Providers: SeriousBroker.it  This test is not yet approved or cleared by the [place] FDA and has been authorized for detection and/or diagnosis of SARS-CoV-2 by FDA under an Emergency Use Authorization (EUA). This EUA will remain in effect (meaning this test can be used) for the duration of the COVID-19 declaration under Section 564(b)(1) of the Act, 21 U.S.C. section 360bbb-3(b)(1), unless the authorization is terminated or revoked.  Performed at [facility], [address], [place], [place] [address]   Urine Culture     Status: Abnormal   Collection Time: 11/19/20 12:18 PM   Specimen: Urine, Clean Catch  Result Value Ref Range Status   Specimen Description URINE, CLEAN CATCH  Final   Special Requests NONE  Final   Culture (A)  Final    <10,000 COLONIES/mL INSIGNIFICANT GROWTH Performed at [facility], [address], [place], [place] [address]    Report Status 11/20/2020 FINAL  Final  Respiratory (~20 pathogens) panel by PCR     Status: None   Collection Time: 11/20/20 12:26 PM   Specimen: Nasopharyngeal Swab; Respiratory  Result Value Ref Range Status   Adenovirus NOT DETECTED NOT DETECTED Final   Coronavirus 229E NOT DETECTED NOT DETECTED Final    Comment: (NOTE) The Coronavirus on the Respiratory Panel, DOES NOT test for the novel  Coronavirus (2019 nCoV)    Coronavirus HKU1 NOT DETECTED NOT DETECTED Final   Coronavirus NL63 NOT DETECTED NOT DETECTED Final   Coronavirus OC43 NOT DETECTED NOT DETECTED Final   Metapneumovirus NOT DETECTED NOT DETECTED Final   Rhinovirus / Enterovirus NOT DETECTED NOT DETECTED Final   Influenza A NOT DETECTED NOT DETECTED Final   Influenza B NOT DETECTED NOT DETECTED Final   Parainfluenza Virus 1 NOT DETECTED NOT DETECTED Final   Parainfluenza Virus 2 NOT DETECTED NOT DETECTED Final   Parainfluenza  Virus 3 NOT DETECTED NOT DETECTED Final   Parainfluenza Virus 4 NOT DETECTED NOT DETECTED Final   Respiratory Syncytial Virus NOT DETECTED NOT DETECTED Final   Bordetella pertussis NOT DETECTED NOT DETECTED Final   Bordetella Parapertussis NOT DETECTED NOT DETECTED Final   Chlamydophila pneumoniae NOT DETECTED NOT DETECTED Final   Mycoplasma pneumoniae NOT DETECTED NOT DETECTED Final    Comment: Performed at [facility], [address] [place], [place] [address]  Resp Panel by RT-PCR (Flu A&B, Covid) Nasopharyngeal Swab     Status: None   Collection Time: 11/23/20 11:52 AM   Specimen: Nasopharyngeal Swab; Nasopharyngeal(NP) swabs in vial transport medium  Result Value Ref Range Status   SARS Coronavirus 2 by RT PCR NEGATIVE NEGATIVE Final    Comment: (NOTE) SARS-CoV-2 target nucleic acids are NOT DETECTED.  The SARS-CoV-2 RNA is generally detectable in upper respiratory specimens during the acute phase of infection. The lowest concentration of SARS-CoV-2 viral copies this assay can detect is 138 copies/mL. A negative result does not preclude SARS-Cov-2 infection and should not be used as the sole basis for treatment or other patient management decisions. A negative result may occur with  improper specimen collection/handling, submission of specimen other than nasopharyngeal swab, presence of viral mutation(s) within the areas targeted by this assay, and inadequate number of viral copies(<138 copies/mL). A negative result must be combined with clinical observations, patient history, and epidemiological information. The expected result is Negative.  Fact Sheet for Patients:  BloggerCourse.com  Fact Sheet for Healthcare Providers:  SeriousBroker.it  This test is no t yet approved or cleared by the [place] FDA and  has been authorized for detection and/or diagnosis of SARS-CoV-2 by FDA under an Emergency Use Authorization  (EUA). This EUA will remain  in effect (meaning this test can be used) for the duration of the COVID-19 declaration under Section 564(b)(1) of the Act, 21 U.S.C.section 360bbb-3(b)(1), unless the authorization is terminated  or revoked sooner.       Influenza A by PCR NEGATIVE NEGATIVE Final   Influenza B by PCR NEGATIVE NEGATIVE Final    Comment: (NOTE) The Xpert Xpress SARS-CoV-2/FLU/RSV plus assay is intended as an aid in the diagnosis of influenza from Nasopharyngeal swab specimens and should not be used as a sole basis for treatment. Nasal washings and aspirates are unacceptable for Xpert Xpress SARS-CoV-2/FLU/RSV testing.  Fact Sheet for Patients: BloggerCourse.com  Fact Sheet for Healthcare Providers: SeriousBroker.it  This test is not yet approved or cleared by the [place] and has been authorized for detection and/or diagnosis of SARS-CoV-2 by FDA under an Emergency Use Authorization (EUA). This EUA will remain in effect (meaning this test can be used) for the duration of the COVID-19 declaration under Section 564(b)(1) of the Act, 21 U.S.C. section 360bbb-3(b)(1), unless the authorization is terminated or revoked.  Performed at [facility], [address], [place], [place] [address]    Time coordinating discharge: 35 minutes  SIGNED:  Merlene Laughter, DO [facility] 11/23/2020, 2:40 PM Pager is on AMION  If 7PM-7AM, please contact night-coverage [email or web address]

## 2020-11-23 NOTE — Progress Notes (Signed)
Discharge instructions sent with patient.

## 2020-11-23 NOTE — Progress Notes (Signed)
Noted patient and her daughter decided not to proceed with MRI and KP/VP.   Will delete the KP/VP order.  Please call NIR for questions and concerns.    Lynann Bologna Freman Lapage PA-C 11/23/2020 9:31 AM

## 2020-11-23 NOTE — Telephone Encounter (Signed)
Pt's daughter Archie Patten called stating her mother is currently in the ED, and the doctor at the ED is wanting to cut her back on levETIRAcetam (KEPPRA) tablet 250 mg. Pt's daughter is requesting a call back.

## 2020-11-23 NOTE — Progress Notes (Addendum)
Mesenteric artery duplex completed. Refer to "CV Proc" under chart review to view preliminary results.  Preliminary results discussed with Dr. Marland Mcalpine.  11/23/2020 9:24 AM Eula Fried., MHA, RVT, RDCS, RDMS

## 2020-11-24 ENCOUNTER — Encounter: Payer: Self-pay | Admitting: Diagnostic Neuroimaging

## 2020-11-24 ENCOUNTER — Telehealth: Payer: Self-pay

## 2020-11-24 DIAGNOSIS — K219 Gastro-esophageal reflux disease without esophagitis: Secondary | ICD-10-CM | POA: Diagnosis not present

## 2020-11-24 DIAGNOSIS — F419 Anxiety disorder, unspecified: Secondary | ICD-10-CM | POA: Diagnosis not present

## 2020-11-24 DIAGNOSIS — F0150 Vascular dementia without behavioral disturbance: Secondary | ICD-10-CM | POA: Diagnosis not present

## 2020-11-24 DIAGNOSIS — E1122 Type 2 diabetes mellitus with diabetic chronic kidney disease: Secondary | ICD-10-CM | POA: Diagnosis not present

## 2020-11-24 DIAGNOSIS — I10 Essential (primary) hypertension: Secondary | ICD-10-CM | POA: Diagnosis not present

## 2020-11-24 DIAGNOSIS — N1832 Chronic kidney disease, stage 3b: Secondary | ICD-10-CM | POA: Diagnosis not present

## 2020-11-24 DIAGNOSIS — I482 Chronic atrial fibrillation, unspecified: Secondary | ICD-10-CM | POA: Diagnosis not present

## 2020-11-24 DIAGNOSIS — I639 Cerebral infarction, unspecified: Secondary | ICD-10-CM | POA: Diagnosis not present

## 2020-11-24 DIAGNOSIS — E039 Hypothyroidism, unspecified: Secondary | ICD-10-CM | POA: Diagnosis not present

## 2020-11-24 MED ORDER — LEVETIRACETAM 250 MG PO TABS: ORAL_TABLET | ORAL | 5 refills | Status: AC

## 2020-11-24 NOTE — Telephone Encounter (Addendum)
Called daughter and informed her Dr Marjory Lies agrees with Keppra 250 mg every other night. She advised to fax to [facility],  attention [facility] NP, ph [phone]. She  verbalized understanding, appreciation. Called SNF, spoke with [place], fax #[phone]. She stated she'll be sure Marian Sorrow gets fax.  New Rx on MD desk for signature.

## 2020-11-24 NOTE — Telephone Encounter (Signed)
New levetiracetam Rx faxed to [facility]. Received confirmation.

## 2020-11-24 NOTE — Addendum Note (Signed)
Addended by: [place] Pink on: 11/24/2020 08:15 AM   Modules accepted: Orders

## 2020-11-24 NOTE — Telephone Encounter (Signed)
I spoke with patient's daughter re: staff message for a 3 month F/U with Mesenteric U/S and OV. Her Daughter [name] states she doesn't feel this appt is necessary because her mom has been feeling better and has been through so much with her being in the hospital. She states if you feel it's medically necessary they will keep the appt. Please advise.

## 2020-11-25 DIAGNOSIS — N183 Chronic kidney disease, stage 3 unspecified: Secondary | ICD-10-CM | POA: Diagnosis not present

## 2020-11-25 DIAGNOSIS — I4891 Unspecified atrial fibrillation: Secondary | ICD-10-CM | POA: Diagnosis not present

## 2020-11-25 DIAGNOSIS — I10 Essential (primary) hypertension: Secondary | ICD-10-CM | POA: Diagnosis not present

## 2020-11-25 DIAGNOSIS — M069 Rheumatoid arthritis, unspecified: Secondary | ICD-10-CM | POA: Diagnosis not present

## 2020-11-26 DIAGNOSIS — E1122 Type 2 diabetes mellitus with diabetic chronic kidney disease: Secondary | ICD-10-CM | POA: Diagnosis not present

## 2020-11-26 DIAGNOSIS — E039 Hypothyroidism, unspecified: Secondary | ICD-10-CM | POA: Diagnosis not present

## 2020-11-26 DIAGNOSIS — N1832 Chronic kidney disease, stage 3b: Secondary | ICD-10-CM | POA: Diagnosis not present

## 2020-11-26 DIAGNOSIS — I10 Essential (primary) hypertension: Secondary | ICD-10-CM | POA: Diagnosis not present

## 2020-11-26 DIAGNOSIS — M069 Rheumatoid arthritis, unspecified: Secondary | ICD-10-CM | POA: Diagnosis not present

## 2020-11-26 DIAGNOSIS — J189 Pneumonia, unspecified organism: Secondary | ICD-10-CM | POA: Diagnosis not present

## 2020-11-26 DIAGNOSIS — I5032 Chronic diastolic (congestive) heart failure: Secondary | ICD-10-CM | POA: Diagnosis not present

## 2020-11-26 DIAGNOSIS — N3020 Other chronic cystitis without hematuria: Secondary | ICD-10-CM | POA: Diagnosis not present

## 2020-11-26 DIAGNOSIS — I639 Cerebral infarction, unspecified: Secondary | ICD-10-CM | POA: Diagnosis not present

## 2020-11-26 DIAGNOSIS — F0150 Vascular dementia without behavioral disturbance: Secondary | ICD-10-CM | POA: Diagnosis not present

## 2020-11-26 DIAGNOSIS — I482 Chronic atrial fibrillation, unspecified: Secondary | ICD-10-CM | POA: Diagnosis not present

## 2020-11-26 DIAGNOSIS — F419 Anxiety disorder, unspecified: Secondary | ICD-10-CM | POA: Diagnosis not present

## 2020-11-26 DIAGNOSIS — K219 Gastro-esophageal reflux disease without esophagitis: Secondary | ICD-10-CM | POA: Diagnosis not present

## 2020-11-26 DIAGNOSIS — I714 Abdominal aortic aneurysm, without rupture: Secondary | ICD-10-CM | POA: Diagnosis not present

## 2020-11-26 DIAGNOSIS — N183 Chronic kidney disease, stage 3 unspecified: Secondary | ICD-10-CM | POA: Diagnosis not present

## 2020-11-27 DIAGNOSIS — E1122 Type 2 diabetes mellitus with diabetic chronic kidney disease: Secondary | ICD-10-CM | POA: Diagnosis not present

## 2020-11-27 DIAGNOSIS — K219 Gastro-esophageal reflux disease without esophagitis: Secondary | ICD-10-CM | POA: Diagnosis not present

## 2020-11-27 DIAGNOSIS — N1832 Chronic kidney disease, stage 3b: Secondary | ICD-10-CM | POA: Diagnosis not present

## 2020-11-27 DIAGNOSIS — E039 Hypothyroidism, unspecified: Secondary | ICD-10-CM | POA: Diagnosis not present

## 2020-11-27 DIAGNOSIS — F419 Anxiety disorder, unspecified: Secondary | ICD-10-CM | POA: Diagnosis not present

## 2020-11-27 DIAGNOSIS — I10 Essential (primary) hypertension: Secondary | ICD-10-CM | POA: Diagnosis not present

## 2020-11-27 DIAGNOSIS — I482 Chronic atrial fibrillation, unspecified: Secondary | ICD-10-CM | POA: Diagnosis not present

## 2020-11-27 DIAGNOSIS — I639 Cerebral infarction, unspecified: Secondary | ICD-10-CM | POA: Diagnosis not present

## 2020-11-27 DIAGNOSIS — F0150 Vascular dementia without behavioral disturbance: Secondary | ICD-10-CM | POA: Diagnosis not present

## 2020-11-28 DIAGNOSIS — F0390 Unspecified dementia without behavioral disturbance: Secondary | ICD-10-CM | POA: Diagnosis not present

## 2020-11-28 DIAGNOSIS — N3020 Other chronic cystitis without hematuria: Secondary | ICD-10-CM | POA: Diagnosis not present

## 2020-11-28 DIAGNOSIS — I5032 Chronic diastolic (congestive) heart failure: Secondary | ICD-10-CM | POA: Diagnosis not present

## 2020-11-28 DIAGNOSIS — I10 Essential (primary) hypertension: Secondary | ICD-10-CM | POA: Diagnosis not present

## 2020-11-30 DIAGNOSIS — E039 Hypothyroidism, unspecified: Secondary | ICD-10-CM | POA: Diagnosis not present

## 2020-11-30 DIAGNOSIS — E1122 Type 2 diabetes mellitus with diabetic chronic kidney disease: Secondary | ICD-10-CM | POA: Diagnosis not present

## 2020-11-30 DIAGNOSIS — I10 Essential (primary) hypertension: Secondary | ICD-10-CM | POA: Diagnosis not present

## 2020-11-30 DIAGNOSIS — I482 Chronic atrial fibrillation, unspecified: Secondary | ICD-10-CM | POA: Diagnosis not present

## 2020-11-30 DIAGNOSIS — I639 Cerebral infarction, unspecified: Secondary | ICD-10-CM | POA: Diagnosis not present

## 2020-11-30 DIAGNOSIS — F0150 Vascular dementia without behavioral disturbance: Secondary | ICD-10-CM | POA: Diagnosis not present

## 2020-11-30 DIAGNOSIS — K219 Gastro-esophageal reflux disease without esophagitis: Secondary | ICD-10-CM | POA: Diagnosis not present

## 2020-11-30 DIAGNOSIS — N1832 Chronic kidney disease, stage 3b: Secondary | ICD-10-CM | POA: Diagnosis not present

## 2020-11-30 DIAGNOSIS — F419 Anxiety disorder, unspecified: Secondary | ICD-10-CM | POA: Diagnosis not present

## 2020-12-01 DIAGNOSIS — E1122 Type 2 diabetes mellitus with diabetic chronic kidney disease: Secondary | ICD-10-CM | POA: Diagnosis not present

## 2020-12-01 DIAGNOSIS — N1832 Chronic kidney disease, stage 3b: Secondary | ICD-10-CM | POA: Diagnosis not present

## 2020-12-01 DIAGNOSIS — F419 Anxiety disorder, unspecified: Secondary | ICD-10-CM | POA: Diagnosis not present

## 2020-12-01 DIAGNOSIS — F0150 Vascular dementia without behavioral disturbance: Secondary | ICD-10-CM | POA: Diagnosis not present

## 2020-12-01 DIAGNOSIS — K219 Gastro-esophageal reflux disease without esophagitis: Secondary | ICD-10-CM | POA: Diagnosis not present

## 2020-12-01 DIAGNOSIS — I10 Essential (primary) hypertension: Secondary | ICD-10-CM | POA: Diagnosis not present

## 2020-12-01 DIAGNOSIS — E039 Hypothyroidism, unspecified: Secondary | ICD-10-CM | POA: Diagnosis not present

## 2020-12-01 DIAGNOSIS — I482 Chronic atrial fibrillation, unspecified: Secondary | ICD-10-CM | POA: Diagnosis not present

## 2020-12-01 DIAGNOSIS — I639 Cerebral infarction, unspecified: Secondary | ICD-10-CM | POA: Diagnosis not present

## 2020-12-02 DIAGNOSIS — I482 Chronic atrial fibrillation, unspecified: Secondary | ICD-10-CM | POA: Diagnosis not present

## 2020-12-02 DIAGNOSIS — I639 Cerebral infarction, unspecified: Secondary | ICD-10-CM | POA: Diagnosis not present

## 2020-12-02 DIAGNOSIS — F419 Anxiety disorder, unspecified: Secondary | ICD-10-CM | POA: Diagnosis not present

## 2020-12-02 DIAGNOSIS — E039 Hypothyroidism, unspecified: Secondary | ICD-10-CM | POA: Diagnosis not present

## 2020-12-02 DIAGNOSIS — E1122 Type 2 diabetes mellitus with diabetic chronic kidney disease: Secondary | ICD-10-CM | POA: Diagnosis not present

## 2020-12-02 DIAGNOSIS — I10 Essential (primary) hypertension: Secondary | ICD-10-CM | POA: Diagnosis not present

## 2020-12-02 DIAGNOSIS — N1832 Chronic kidney disease, stage 3b: Secondary | ICD-10-CM | POA: Diagnosis not present

## 2020-12-02 DIAGNOSIS — K219 Gastro-esophageal reflux disease without esophagitis: Secondary | ICD-10-CM | POA: Diagnosis not present

## 2020-12-02 DIAGNOSIS — F0150 Vascular dementia without behavioral disturbance: Secondary | ICD-10-CM | POA: Diagnosis not present

## 2020-12-03 DIAGNOSIS — I482 Chronic atrial fibrillation, unspecified: Secondary | ICD-10-CM | POA: Diagnosis not present

## 2020-12-03 DIAGNOSIS — I10 Essential (primary) hypertension: Secondary | ICD-10-CM | POA: Diagnosis not present

## 2020-12-03 DIAGNOSIS — K219 Gastro-esophageal reflux disease without esophagitis: Secondary | ICD-10-CM | POA: Diagnosis not present

## 2020-12-03 DIAGNOSIS — E039 Hypothyroidism, unspecified: Secondary | ICD-10-CM | POA: Diagnosis not present

## 2020-12-03 DIAGNOSIS — F419 Anxiety disorder, unspecified: Secondary | ICD-10-CM | POA: Diagnosis not present

## 2020-12-03 DIAGNOSIS — I639 Cerebral infarction, unspecified: Secondary | ICD-10-CM | POA: Diagnosis not present

## 2020-12-03 DIAGNOSIS — E1122 Type 2 diabetes mellitus with diabetic chronic kidney disease: Secondary | ICD-10-CM | POA: Diagnosis not present

## 2020-12-03 DIAGNOSIS — F0150 Vascular dementia without behavioral disturbance: Secondary | ICD-10-CM | POA: Diagnosis not present

## 2020-12-03 DIAGNOSIS — N1832 Chronic kidney disease, stage 3b: Secondary | ICD-10-CM | POA: Diagnosis not present

## 2020-12-04 DIAGNOSIS — F419 Anxiety disorder, unspecified: Secondary | ICD-10-CM | POA: Diagnosis not present

## 2020-12-04 DIAGNOSIS — I10 Essential (primary) hypertension: Secondary | ICD-10-CM | POA: Diagnosis not present

## 2020-12-04 DIAGNOSIS — E039 Hypothyroidism, unspecified: Secondary | ICD-10-CM | POA: Diagnosis not present

## 2020-12-04 DIAGNOSIS — K219 Gastro-esophageal reflux disease without esophagitis: Secondary | ICD-10-CM | POA: Diagnosis not present

## 2020-12-04 DIAGNOSIS — E1122 Type 2 diabetes mellitus with diabetic chronic kidney disease: Secondary | ICD-10-CM | POA: Diagnosis not present

## 2020-12-04 DIAGNOSIS — I482 Chronic atrial fibrillation, unspecified: Secondary | ICD-10-CM | POA: Diagnosis not present

## 2020-12-04 DIAGNOSIS — F0150 Vascular dementia without behavioral disturbance: Secondary | ICD-10-CM | POA: Diagnosis not present

## 2020-12-04 DIAGNOSIS — N1832 Chronic kidney disease, stage 3b: Secondary | ICD-10-CM | POA: Diagnosis not present

## 2020-12-04 DIAGNOSIS — I639 Cerebral infarction, unspecified: Secondary | ICD-10-CM | POA: Diagnosis not present

## 2020-12-08 DIAGNOSIS — I5032 Chronic diastolic (congestive) heart failure: Secondary | ICD-10-CM | POA: Diagnosis not present

## 2020-12-08 DIAGNOSIS — I482 Chronic atrial fibrillation, unspecified: Secondary | ICD-10-CM | POA: Diagnosis not present

## 2020-12-08 DIAGNOSIS — L89159 Pressure ulcer of sacral region, unspecified stage: Secondary | ICD-10-CM | POA: Diagnosis not present

## 2020-12-08 DIAGNOSIS — F419 Anxiety disorder, unspecified: Secondary | ICD-10-CM | POA: Diagnosis not present

## 2020-12-08 DIAGNOSIS — I10 Essential (primary) hypertension: Secondary | ICD-10-CM | POA: Diagnosis not present

## 2020-12-08 DIAGNOSIS — F0150 Vascular dementia without behavioral disturbance: Secondary | ICD-10-CM | POA: Diagnosis not present

## 2020-12-08 DIAGNOSIS — E039 Hypothyroidism, unspecified: Secondary | ICD-10-CM | POA: Diagnosis not present

## 2020-12-08 DIAGNOSIS — I639 Cerebral infarction, unspecified: Secondary | ICD-10-CM | POA: Diagnosis not present

## 2020-12-08 DIAGNOSIS — N1832 Chronic kidney disease, stage 3b: Secondary | ICD-10-CM | POA: Diagnosis not present

## 2020-12-08 DIAGNOSIS — E1122 Type 2 diabetes mellitus with diabetic chronic kidney disease: Secondary | ICD-10-CM | POA: Diagnosis not present

## 2020-12-08 DIAGNOSIS — N3020 Other chronic cystitis without hematuria: Secondary | ICD-10-CM | POA: Diagnosis not present

## 2020-12-08 DIAGNOSIS — K219 Gastro-esophageal reflux disease without esophagitis: Secondary | ICD-10-CM | POA: Diagnosis not present

## 2020-12-08 DIAGNOSIS — F0390 Unspecified dementia without behavioral disturbance: Secondary | ICD-10-CM | POA: Diagnosis not present

## 2020-12-10 DIAGNOSIS — E1122 Type 2 diabetes mellitus with diabetic chronic kidney disease: Secondary | ICD-10-CM | POA: Diagnosis not present

## 2020-12-10 DIAGNOSIS — F419 Anxiety disorder, unspecified: Secondary | ICD-10-CM | POA: Diagnosis not present

## 2020-12-10 DIAGNOSIS — F0150 Vascular dementia without behavioral disturbance: Secondary | ICD-10-CM | POA: Diagnosis not present

## 2020-12-10 DIAGNOSIS — F331 Major depressive disorder, recurrent, moderate: Secondary | ICD-10-CM | POA: Diagnosis not present

## 2020-12-10 DIAGNOSIS — I482 Chronic atrial fibrillation, unspecified: Secondary | ICD-10-CM | POA: Diagnosis not present

## 2020-12-10 DIAGNOSIS — N1832 Chronic kidney disease, stage 3b: Secondary | ICD-10-CM | POA: Diagnosis not present

## 2020-12-10 DIAGNOSIS — E039 Hypothyroidism, unspecified: Secondary | ICD-10-CM | POA: Diagnosis not present

## 2020-12-10 DIAGNOSIS — I10 Essential (primary) hypertension: Secondary | ICD-10-CM | POA: Diagnosis not present

## 2020-12-10 DIAGNOSIS — K219 Gastro-esophageal reflux disease without esophagitis: Secondary | ICD-10-CM | POA: Diagnosis not present

## 2020-12-11 DIAGNOSIS — F331 Major depressive disorder, recurrent, moderate: Secondary | ICD-10-CM | POA: Diagnosis not present

## 2020-12-11 DIAGNOSIS — E039 Hypothyroidism, unspecified: Secondary | ICD-10-CM | POA: Diagnosis not present

## 2020-12-11 DIAGNOSIS — I10 Essential (primary) hypertension: Secondary | ICD-10-CM | POA: Diagnosis not present

## 2020-12-11 DIAGNOSIS — E1122 Type 2 diabetes mellitus with diabetic chronic kidney disease: Secondary | ICD-10-CM | POA: Diagnosis not present

## 2020-12-11 DIAGNOSIS — K219 Gastro-esophageal reflux disease without esophagitis: Secondary | ICD-10-CM | POA: Diagnosis not present

## 2020-12-11 DIAGNOSIS — F419 Anxiety disorder, unspecified: Secondary | ICD-10-CM | POA: Diagnosis not present

## 2020-12-11 DIAGNOSIS — I482 Chronic atrial fibrillation, unspecified: Secondary | ICD-10-CM | POA: Diagnosis not present

## 2020-12-11 DIAGNOSIS — N1832 Chronic kidney disease, stage 3b: Secondary | ICD-10-CM | POA: Diagnosis not present

## 2020-12-11 DIAGNOSIS — F0150 Vascular dementia without behavioral disturbance: Secondary | ICD-10-CM | POA: Diagnosis not present

## 2020-12-14 DIAGNOSIS — I482 Chronic atrial fibrillation, unspecified: Secondary | ICD-10-CM | POA: Diagnosis not present

## 2020-12-14 DIAGNOSIS — E1122 Type 2 diabetes mellitus with diabetic chronic kidney disease: Secondary | ICD-10-CM | POA: Diagnosis not present

## 2020-12-14 DIAGNOSIS — F0150 Vascular dementia without behavioral disturbance: Secondary | ICD-10-CM | POA: Diagnosis not present

## 2020-12-14 DIAGNOSIS — E039 Hypothyroidism, unspecified: Secondary | ICD-10-CM | POA: Diagnosis not present

## 2020-12-14 DIAGNOSIS — K219 Gastro-esophageal reflux disease without esophagitis: Secondary | ICD-10-CM | POA: Diagnosis not present

## 2020-12-14 DIAGNOSIS — F419 Anxiety disorder, unspecified: Secondary | ICD-10-CM | POA: Diagnosis not present

## 2020-12-14 DIAGNOSIS — N1832 Chronic kidney disease, stage 3b: Secondary | ICD-10-CM | POA: Diagnosis not present

## 2020-12-14 DIAGNOSIS — I10 Essential (primary) hypertension: Secondary | ICD-10-CM | POA: Diagnosis not present

## 2020-12-14 DIAGNOSIS — F331 Major depressive disorder, recurrent, moderate: Secondary | ICD-10-CM | POA: Diagnosis not present

## 2020-12-15 DIAGNOSIS — F0150 Vascular dementia without behavioral disturbance: Secondary | ICD-10-CM | POA: Diagnosis not present

## 2020-12-15 DIAGNOSIS — E039 Hypothyroidism, unspecified: Secondary | ICD-10-CM | POA: Diagnosis not present

## 2020-12-15 DIAGNOSIS — E1122 Type 2 diabetes mellitus with diabetic chronic kidney disease: Secondary | ICD-10-CM | POA: Diagnosis not present

## 2020-12-15 DIAGNOSIS — I482 Chronic atrial fibrillation, unspecified: Secondary | ICD-10-CM | POA: Diagnosis not present

## 2020-12-15 DIAGNOSIS — N1832 Chronic kidney disease, stage 3b: Secondary | ICD-10-CM | POA: Diagnosis not present

## 2020-12-15 DIAGNOSIS — F331 Major depressive disorder, recurrent, moderate: Secondary | ICD-10-CM | POA: Diagnosis not present

## 2020-12-15 DIAGNOSIS — K219 Gastro-esophageal reflux disease without esophagitis: Secondary | ICD-10-CM | POA: Diagnosis not present

## 2020-12-15 DIAGNOSIS — F419 Anxiety disorder, unspecified: Secondary | ICD-10-CM | POA: Diagnosis not present

## 2020-12-15 DIAGNOSIS — I10 Essential (primary) hypertension: Secondary | ICD-10-CM | POA: Diagnosis not present

## 2020-12-16 DIAGNOSIS — E039 Hypothyroidism, unspecified: Secondary | ICD-10-CM | POA: Diagnosis not present

## 2020-12-16 DIAGNOSIS — F0281 Dementia in other diseases classified elsewhere with behavioral disturbance: Secondary | ICD-10-CM | POA: Diagnosis not present

## 2020-12-16 DIAGNOSIS — F0150 Vascular dementia without behavioral disturbance: Secondary | ICD-10-CM | POA: Diagnosis not present

## 2020-12-16 DIAGNOSIS — K219 Gastro-esophageal reflux disease without esophagitis: Secondary | ICD-10-CM | POA: Diagnosis not present

## 2020-12-16 DIAGNOSIS — N1832 Chronic kidney disease, stage 3b: Secondary | ICD-10-CM | POA: Diagnosis not present

## 2020-12-16 DIAGNOSIS — E1122 Type 2 diabetes mellitus with diabetic chronic kidney disease: Secondary | ICD-10-CM | POA: Diagnosis not present

## 2020-12-16 DIAGNOSIS — G4700 Insomnia, unspecified: Secondary | ICD-10-CM | POA: Diagnosis not present

## 2020-12-16 DIAGNOSIS — F331 Major depressive disorder, recurrent, moderate: Secondary | ICD-10-CM | POA: Diagnosis not present

## 2020-12-16 DIAGNOSIS — F419 Anxiety disorder, unspecified: Secondary | ICD-10-CM | POA: Diagnosis not present

## 2020-12-16 DIAGNOSIS — F0151 Vascular dementia with behavioral disturbance: Secondary | ICD-10-CM | POA: Diagnosis not present

## 2020-12-16 DIAGNOSIS — G301 Alzheimer's disease with late onset: Secondary | ICD-10-CM | POA: Diagnosis not present

## 2020-12-16 DIAGNOSIS — I482 Chronic atrial fibrillation, unspecified: Secondary | ICD-10-CM | POA: Diagnosis not present

## 2020-12-16 DIAGNOSIS — I10 Essential (primary) hypertension: Secondary | ICD-10-CM | POA: Diagnosis not present

## 2020-12-17 DIAGNOSIS — I10 Essential (primary) hypertension: Secondary | ICD-10-CM | POA: Diagnosis not present

## 2020-12-17 DIAGNOSIS — N1832 Chronic kidney disease, stage 3b: Secondary | ICD-10-CM | POA: Diagnosis not present

## 2020-12-17 DIAGNOSIS — E1122 Type 2 diabetes mellitus with diabetic chronic kidney disease: Secondary | ICD-10-CM | POA: Diagnosis not present

## 2020-12-17 DIAGNOSIS — F331 Major depressive disorder, recurrent, moderate: Secondary | ICD-10-CM | POA: Diagnosis not present

## 2020-12-17 DIAGNOSIS — I482 Chronic atrial fibrillation, unspecified: Secondary | ICD-10-CM | POA: Diagnosis not present

## 2020-12-17 DIAGNOSIS — F419 Anxiety disorder, unspecified: Secondary | ICD-10-CM | POA: Diagnosis not present

## 2020-12-17 DIAGNOSIS — K219 Gastro-esophageal reflux disease without esophagitis: Secondary | ICD-10-CM | POA: Diagnosis not present

## 2020-12-17 DIAGNOSIS — F0150 Vascular dementia without behavioral disturbance: Secondary | ICD-10-CM | POA: Diagnosis not present

## 2020-12-17 DIAGNOSIS — E039 Hypothyroidism, unspecified: Secondary | ICD-10-CM | POA: Diagnosis not present

## 2020-12-18 DIAGNOSIS — I482 Chronic atrial fibrillation, unspecified: Secondary | ICD-10-CM | POA: Diagnosis not present

## 2020-12-18 DIAGNOSIS — F419 Anxiety disorder, unspecified: Secondary | ICD-10-CM | POA: Diagnosis not present

## 2020-12-18 DIAGNOSIS — N3020 Other chronic cystitis without hematuria: Secondary | ICD-10-CM | POA: Diagnosis not present

## 2020-12-18 DIAGNOSIS — I270 Primary pulmonary hypertension: Secondary | ICD-10-CM | POA: Diagnosis not present

## 2020-12-18 DIAGNOSIS — F0390 Unspecified dementia without behavioral disturbance: Secondary | ICD-10-CM | POA: Diagnosis not present

## 2020-12-18 DIAGNOSIS — E119 Type 2 diabetes mellitus without complications: Secondary | ICD-10-CM | POA: Diagnosis not present

## 2020-12-18 DIAGNOSIS — I10 Essential (primary) hypertension: Secondary | ICD-10-CM | POA: Diagnosis not present

## 2020-12-18 DIAGNOSIS — E1122 Type 2 diabetes mellitus with diabetic chronic kidney disease: Secondary | ICD-10-CM | POA: Diagnosis not present

## 2020-12-18 DIAGNOSIS — F331 Major depressive disorder, recurrent, moderate: Secondary | ICD-10-CM | POA: Diagnosis not present

## 2020-12-18 DIAGNOSIS — F0150 Vascular dementia without behavioral disturbance: Secondary | ICD-10-CM | POA: Diagnosis not present

## 2020-12-18 DIAGNOSIS — N1832 Chronic kidney disease, stage 3b: Secondary | ICD-10-CM | POA: Diagnosis not present

## 2020-12-18 DIAGNOSIS — K219 Gastro-esophageal reflux disease without esophagitis: Secondary | ICD-10-CM | POA: Diagnosis not present

## 2020-12-18 DIAGNOSIS — M069 Rheumatoid arthritis, unspecified: Secondary | ICD-10-CM | POA: Diagnosis not present

## 2020-12-18 DIAGNOSIS — I5032 Chronic diastolic (congestive) heart failure: Secondary | ICD-10-CM | POA: Diagnosis not present

## 2020-12-18 DIAGNOSIS — E039 Hypothyroidism, unspecified: Secondary | ICD-10-CM | POA: Diagnosis not present

## 2020-12-20 DIAGNOSIS — R4182 Altered mental status, unspecified: Secondary | ICD-10-CM | POA: Diagnosis not present

## 2020-12-21 DIAGNOSIS — R2689 Other abnormalities of gait and mobility: Secondary | ICD-10-CM | POA: Diagnosis not present

## 2020-12-21 DIAGNOSIS — M6281 Muscle weakness (generalized): Secondary | ICD-10-CM | POA: Diagnosis not present

## 2020-12-21 DIAGNOSIS — R41841 Cognitive communication deficit: Secondary | ICD-10-CM | POA: Diagnosis not present

## 2020-12-21 DIAGNOSIS — E1122 Type 2 diabetes mellitus with diabetic chronic kidney disease: Secondary | ICD-10-CM | POA: Diagnosis not present

## 2020-12-23 DIAGNOSIS — R2689 Other abnormalities of gait and mobility: Secondary | ICD-10-CM | POA: Diagnosis not present

## 2020-12-23 DIAGNOSIS — M6281 Muscle weakness (generalized): Secondary | ICD-10-CM | POA: Diagnosis not present

## 2020-12-23 DIAGNOSIS — E1122 Type 2 diabetes mellitus with diabetic chronic kidney disease: Secondary | ICD-10-CM | POA: Diagnosis not present

## 2020-12-23 DIAGNOSIS — R41841 Cognitive communication deficit: Secondary | ICD-10-CM | POA: Diagnosis not present

## 2020-12-24 DIAGNOSIS — R2689 Other abnormalities of gait and mobility: Secondary | ICD-10-CM | POA: Diagnosis not present

## 2020-12-24 DIAGNOSIS — Z6830 Body mass index (BMI) 30.0-30.9, adult: Secondary | ICD-10-CM | POA: Diagnosis not present

## 2020-12-24 DIAGNOSIS — Z299 Encounter for prophylactic measures, unspecified: Secondary | ICD-10-CM | POA: Diagnosis not present

## 2020-12-24 DIAGNOSIS — S91301A Unspecified open wound, right foot, initial encounter: Secondary | ICD-10-CM | POA: Diagnosis not present

## 2020-12-24 DIAGNOSIS — Z0000 Encounter for general adult medical examination without abnormal findings: Secondary | ICD-10-CM | POA: Diagnosis not present

## 2020-12-24 DIAGNOSIS — R41841 Cognitive communication deficit: Secondary | ICD-10-CM | POA: Diagnosis not present

## 2020-12-24 DIAGNOSIS — M6281 Muscle weakness (generalized): Secondary | ICD-10-CM | POA: Diagnosis not present

## 2020-12-24 DIAGNOSIS — E7800 Pure hypercholesterolemia, unspecified: Secondary | ICD-10-CM | POA: Diagnosis not present

## 2020-12-24 DIAGNOSIS — E1122 Type 2 diabetes mellitus with diabetic chronic kidney disease: Secondary | ICD-10-CM | POA: Diagnosis not present

## 2020-12-24 DIAGNOSIS — R5383 Other fatigue: Secondary | ICD-10-CM | POA: Diagnosis not present

## 2020-12-28 DIAGNOSIS — E1122 Type 2 diabetes mellitus with diabetic chronic kidney disease: Secondary | ICD-10-CM | POA: Diagnosis not present

## 2020-12-28 DIAGNOSIS — M6281 Muscle weakness (generalized): Secondary | ICD-10-CM | POA: Diagnosis not present

## 2020-12-28 DIAGNOSIS — R41841 Cognitive communication deficit: Secondary | ICD-10-CM | POA: Diagnosis not present

## 2020-12-28 DIAGNOSIS — R2689 Other abnormalities of gait and mobility: Secondary | ICD-10-CM | POA: Diagnosis not present

## 2020-12-29 DIAGNOSIS — R41841 Cognitive communication deficit: Secondary | ICD-10-CM | POA: Diagnosis not present

## 2020-12-29 DIAGNOSIS — E1122 Type 2 diabetes mellitus with diabetic chronic kidney disease: Secondary | ICD-10-CM | POA: Diagnosis not present

## 2020-12-29 DIAGNOSIS — R2689 Other abnormalities of gait and mobility: Secondary | ICD-10-CM | POA: Diagnosis not present

## 2020-12-29 DIAGNOSIS — M6281 Muscle weakness (generalized): Secondary | ICD-10-CM | POA: Diagnosis not present

## 2020-12-30 DIAGNOSIS — R41841 Cognitive communication deficit: Secondary | ICD-10-CM | POA: Diagnosis not present

## 2020-12-30 DIAGNOSIS — E1122 Type 2 diabetes mellitus with diabetic chronic kidney disease: Secondary | ICD-10-CM | POA: Diagnosis not present

## 2020-12-30 DIAGNOSIS — M6281 Muscle weakness (generalized): Secondary | ICD-10-CM | POA: Diagnosis not present

## 2020-12-30 DIAGNOSIS — R2689 Other abnormalities of gait and mobility: Secondary | ICD-10-CM | POA: Diagnosis not present

## 2020-12-31 DIAGNOSIS — I4891 Unspecified atrial fibrillation: Secondary | ICD-10-CM | POA: Diagnosis not present

## 2020-12-31 DIAGNOSIS — M6281 Muscle weakness (generalized): Secondary | ICD-10-CM | POA: Diagnosis not present

## 2020-12-31 DIAGNOSIS — R2689 Other abnormalities of gait and mobility: Secondary | ICD-10-CM | POA: Diagnosis not present

## 2020-12-31 DIAGNOSIS — I10 Essential (primary) hypertension: Secondary | ICD-10-CM | POA: Diagnosis not present

## 2020-12-31 DIAGNOSIS — E1122 Type 2 diabetes mellitus with diabetic chronic kidney disease: Secondary | ICD-10-CM | POA: Diagnosis not present

## 2020-12-31 DIAGNOSIS — Z299 Encounter for prophylactic measures, unspecified: Secondary | ICD-10-CM | POA: Diagnosis not present

## 2020-12-31 DIAGNOSIS — I509 Heart failure, unspecified: Secondary | ICD-10-CM | POA: Diagnosis not present

## 2020-12-31 DIAGNOSIS — R600 Localized edema: Secondary | ICD-10-CM | POA: Diagnosis not present

## 2020-12-31 DIAGNOSIS — E1129 Type 2 diabetes mellitus with other diabetic kidney complication: Secondary | ICD-10-CM | POA: Diagnosis not present

## 2020-12-31 DIAGNOSIS — R41841 Cognitive communication deficit: Secondary | ICD-10-CM | POA: Diagnosis not present

## 2021-01-01 DIAGNOSIS — R2689 Other abnormalities of gait and mobility: Secondary | ICD-10-CM | POA: Diagnosis not present

## 2021-01-01 DIAGNOSIS — E1122 Type 2 diabetes mellitus with diabetic chronic kidney disease: Secondary | ICD-10-CM | POA: Diagnosis not present

## 2021-01-01 DIAGNOSIS — R41841 Cognitive communication deficit: Secondary | ICD-10-CM | POA: Diagnosis not present

## 2021-01-01 DIAGNOSIS — M6281 Muscle weakness (generalized): Secondary | ICD-10-CM | POA: Diagnosis not present

## 2021-01-02 DIAGNOSIS — M6281 Muscle weakness (generalized): Secondary | ICD-10-CM | POA: Diagnosis not present

## 2021-01-02 DIAGNOSIS — R41841 Cognitive communication deficit: Secondary | ICD-10-CM | POA: Diagnosis not present

## 2021-01-02 DIAGNOSIS — E1122 Type 2 diabetes mellitus with diabetic chronic kidney disease: Secondary | ICD-10-CM | POA: Diagnosis not present

## 2021-01-02 DIAGNOSIS — R2689 Other abnormalities of gait and mobility: Secondary | ICD-10-CM | POA: Diagnosis not present

## 2021-01-04 DIAGNOSIS — R41841 Cognitive communication deficit: Secondary | ICD-10-CM | POA: Diagnosis not present

## 2021-01-04 DIAGNOSIS — E1122 Type 2 diabetes mellitus with diabetic chronic kidney disease: Secondary | ICD-10-CM | POA: Diagnosis not present

## 2021-01-04 DIAGNOSIS — M6281 Muscle weakness (generalized): Secondary | ICD-10-CM | POA: Diagnosis not present

## 2021-01-04 DIAGNOSIS — R2689 Other abnormalities of gait and mobility: Secondary | ICD-10-CM | POA: Diagnosis not present

## 2021-01-05 DIAGNOSIS — E1122 Type 2 diabetes mellitus with diabetic chronic kidney disease: Secondary | ICD-10-CM | POA: Diagnosis not present

## 2021-01-05 DIAGNOSIS — R41841 Cognitive communication deficit: Secondary | ICD-10-CM | POA: Diagnosis not present

## 2021-01-05 DIAGNOSIS — M6281 Muscle weakness (generalized): Secondary | ICD-10-CM | POA: Diagnosis not present

## 2021-01-05 DIAGNOSIS — R2689 Other abnormalities of gait and mobility: Secondary | ICD-10-CM | POA: Diagnosis not present

## 2021-01-07 DIAGNOSIS — R2689 Other abnormalities of gait and mobility: Secondary | ICD-10-CM | POA: Diagnosis not present

## 2021-01-07 DIAGNOSIS — M6281 Muscle weakness (generalized): Secondary | ICD-10-CM | POA: Diagnosis not present

## 2021-01-07 DIAGNOSIS — E1122 Type 2 diabetes mellitus with diabetic chronic kidney disease: Secondary | ICD-10-CM | POA: Diagnosis not present

## 2021-01-07 DIAGNOSIS — Z23 Encounter for immunization: Secondary | ICD-10-CM | POA: Diagnosis not present

## 2021-01-07 DIAGNOSIS — R41841 Cognitive communication deficit: Secondary | ICD-10-CM | POA: Diagnosis not present

## 2021-01-11 DIAGNOSIS — E1122 Type 2 diabetes mellitus with diabetic chronic kidney disease: Secondary | ICD-10-CM | POA: Diagnosis not present

## 2021-01-11 DIAGNOSIS — R41841 Cognitive communication deficit: Secondary | ICD-10-CM | POA: Diagnosis not present

## 2021-01-11 DIAGNOSIS — R2689 Other abnormalities of gait and mobility: Secondary | ICD-10-CM | POA: Diagnosis not present

## 2021-01-11 DIAGNOSIS — M6281 Muscle weakness (generalized): Secondary | ICD-10-CM | POA: Diagnosis not present

## 2021-01-12 DIAGNOSIS — M6281 Muscle weakness (generalized): Secondary | ICD-10-CM | POA: Diagnosis not present

## 2021-01-12 DIAGNOSIS — E1122 Type 2 diabetes mellitus with diabetic chronic kidney disease: Secondary | ICD-10-CM | POA: Diagnosis not present

## 2021-01-12 DIAGNOSIS — R2689 Other abnormalities of gait and mobility: Secondary | ICD-10-CM | POA: Diagnosis not present

## 2021-01-12 DIAGNOSIS — R41841 Cognitive communication deficit: Secondary | ICD-10-CM | POA: Diagnosis not present

## 2021-01-13 DIAGNOSIS — E1122 Type 2 diabetes mellitus with diabetic chronic kidney disease: Secondary | ICD-10-CM | POA: Diagnosis not present

## 2021-01-13 DIAGNOSIS — R41841 Cognitive communication deficit: Secondary | ICD-10-CM | POA: Diagnosis not present

## 2021-01-13 DIAGNOSIS — R2689 Other abnormalities of gait and mobility: Secondary | ICD-10-CM | POA: Diagnosis not present

## 2021-01-13 DIAGNOSIS — M6281 Muscle weakness (generalized): Secondary | ICD-10-CM | POA: Diagnosis not present

## 2021-01-14 DIAGNOSIS — E1122 Type 2 diabetes mellitus with diabetic chronic kidney disease: Secondary | ICD-10-CM | POA: Diagnosis not present

## 2021-01-14 DIAGNOSIS — R41841 Cognitive communication deficit: Secondary | ICD-10-CM | POA: Diagnosis not present

## 2021-01-14 DIAGNOSIS — M6281 Muscle weakness (generalized): Secondary | ICD-10-CM | POA: Diagnosis not present

## 2021-01-14 DIAGNOSIS — R2689 Other abnormalities of gait and mobility: Secondary | ICD-10-CM | POA: Diagnosis not present

## 2021-01-15 DIAGNOSIS — R2689 Other abnormalities of gait and mobility: Secondary | ICD-10-CM | POA: Diagnosis not present

## 2021-01-15 DIAGNOSIS — R41841 Cognitive communication deficit: Secondary | ICD-10-CM | POA: Diagnosis not present

## 2021-01-15 DIAGNOSIS — M6281 Muscle weakness (generalized): Secondary | ICD-10-CM | POA: Diagnosis not present

## 2021-01-15 DIAGNOSIS — E1122 Type 2 diabetes mellitus with diabetic chronic kidney disease: Secondary | ICD-10-CM | POA: Diagnosis not present

## 2021-01-16 DIAGNOSIS — R41841 Cognitive communication deficit: Secondary | ICD-10-CM | POA: Diagnosis not present

## 2021-01-16 DIAGNOSIS — R2689 Other abnormalities of gait and mobility: Secondary | ICD-10-CM | POA: Diagnosis not present

## 2021-01-16 DIAGNOSIS — E1122 Type 2 diabetes mellitus with diabetic chronic kidney disease: Secondary | ICD-10-CM | POA: Diagnosis not present

## 2021-01-16 DIAGNOSIS — M6281 Muscle weakness (generalized): Secondary | ICD-10-CM | POA: Diagnosis not present

## 2021-01-18 DIAGNOSIS — M6281 Muscle weakness (generalized): Secondary | ICD-10-CM | POA: Diagnosis not present

## 2021-01-18 DIAGNOSIS — R2689 Other abnormalities of gait and mobility: Secondary | ICD-10-CM | POA: Diagnosis not present

## 2021-01-18 DIAGNOSIS — R41841 Cognitive communication deficit: Secondary | ICD-10-CM | POA: Diagnosis not present

## 2021-01-18 DIAGNOSIS — E1122 Type 2 diabetes mellitus with diabetic chronic kidney disease: Secondary | ICD-10-CM | POA: Diagnosis not present

## 2021-01-19 DIAGNOSIS — R41841 Cognitive communication deficit: Secondary | ICD-10-CM | POA: Diagnosis not present

## 2021-01-19 DIAGNOSIS — M6281 Muscle weakness (generalized): Secondary | ICD-10-CM | POA: Diagnosis not present

## 2021-01-19 DIAGNOSIS — E1122 Type 2 diabetes mellitus with diabetic chronic kidney disease: Secondary | ICD-10-CM | POA: Diagnosis not present

## 2021-01-19 DIAGNOSIS — R2689 Other abnormalities of gait and mobility: Secondary | ICD-10-CM | POA: Diagnosis not present

## 2021-01-20 DIAGNOSIS — M6281 Muscle weakness (generalized): Secondary | ICD-10-CM | POA: Diagnosis not present

## 2021-01-20 DIAGNOSIS — R2689 Other abnormalities of gait and mobility: Secondary | ICD-10-CM | POA: Diagnosis not present

## 2021-01-20 DIAGNOSIS — E1122 Type 2 diabetes mellitus with diabetic chronic kidney disease: Secondary | ICD-10-CM | POA: Diagnosis not present

## 2021-01-20 DIAGNOSIS — R41841 Cognitive communication deficit: Secondary | ICD-10-CM | POA: Diagnosis not present

## 2021-01-21 DIAGNOSIS — R41841 Cognitive communication deficit: Secondary | ICD-10-CM | POA: Diagnosis not present

## 2021-01-21 DIAGNOSIS — R2689 Other abnormalities of gait and mobility: Secondary | ICD-10-CM | POA: Diagnosis not present

## 2021-01-21 DIAGNOSIS — M6281 Muscle weakness (generalized): Secondary | ICD-10-CM | POA: Diagnosis not present

## 2021-01-21 DIAGNOSIS — E1122 Type 2 diabetes mellitus with diabetic chronic kidney disease: Secondary | ICD-10-CM | POA: Diagnosis not present

## 2021-01-22 DIAGNOSIS — R41841 Cognitive communication deficit: Secondary | ICD-10-CM | POA: Diagnosis not present

## 2021-01-22 DIAGNOSIS — E1122 Type 2 diabetes mellitus with diabetic chronic kidney disease: Secondary | ICD-10-CM | POA: Diagnosis not present

## 2021-01-22 DIAGNOSIS — M6281 Muscle weakness (generalized): Secondary | ICD-10-CM | POA: Diagnosis not present

## 2021-01-22 DIAGNOSIS — R2689 Other abnormalities of gait and mobility: Secondary | ICD-10-CM | POA: Diagnosis not present

## 2021-01-25 DIAGNOSIS — R2689 Other abnormalities of gait and mobility: Secondary | ICD-10-CM | POA: Diagnosis not present

## 2021-01-25 DIAGNOSIS — R41841 Cognitive communication deficit: Secondary | ICD-10-CM | POA: Diagnosis not present

## 2021-01-25 DIAGNOSIS — M6281 Muscle weakness (generalized): Secondary | ICD-10-CM | POA: Diagnosis not present

## 2021-01-25 DIAGNOSIS — E1122 Type 2 diabetes mellitus with diabetic chronic kidney disease: Secondary | ICD-10-CM | POA: Diagnosis not present

## 2021-01-26 DIAGNOSIS — M6281 Muscle weakness (generalized): Secondary | ICD-10-CM | POA: Diagnosis not present

## 2021-01-26 DIAGNOSIS — R0602 Shortness of breath: Secondary | ICD-10-CM | POA: Diagnosis not present

## 2021-01-26 DIAGNOSIS — R2689 Other abnormalities of gait and mobility: Secondary | ICD-10-CM | POA: Diagnosis not present

## 2021-01-26 DIAGNOSIS — E1122 Type 2 diabetes mellitus with diabetic chronic kidney disease: Secondary | ICD-10-CM | POA: Diagnosis not present

## 2021-01-26 DIAGNOSIS — L97511 Non-pressure chronic ulcer of other part of right foot limited to breakdown of skin: Secondary | ICD-10-CM | POA: Diagnosis not present

## 2021-01-26 DIAGNOSIS — R41841 Cognitive communication deficit: Secondary | ICD-10-CM | POA: Diagnosis not present

## 2021-01-27 DIAGNOSIS — R2689 Other abnormalities of gait and mobility: Secondary | ICD-10-CM | POA: Diagnosis not present

## 2021-01-27 DIAGNOSIS — R41841 Cognitive communication deficit: Secondary | ICD-10-CM | POA: Diagnosis not present

## 2021-01-27 DIAGNOSIS — E1122 Type 2 diabetes mellitus with diabetic chronic kidney disease: Secondary | ICD-10-CM | POA: Diagnosis not present

## 2021-01-27 DIAGNOSIS — M6281 Muscle weakness (generalized): Secondary | ICD-10-CM | POA: Diagnosis not present

## 2021-01-28 DIAGNOSIS — F32A Depression, unspecified: Secondary | ICD-10-CM | POA: Diagnosis not present

## 2021-01-28 DIAGNOSIS — E1122 Type 2 diabetes mellitus with diabetic chronic kidney disease: Secondary | ICD-10-CM | POA: Diagnosis not present

## 2021-01-28 DIAGNOSIS — M6281 Muscle weakness (generalized): Secondary | ICD-10-CM | POA: Diagnosis not present

## 2021-01-28 DIAGNOSIS — F413 Other mixed anxiety disorders: Secondary | ICD-10-CM | POA: Diagnosis not present

## 2021-01-28 DIAGNOSIS — F0390 Unspecified dementia without behavioral disturbance: Secondary | ICD-10-CM | POA: Diagnosis not present

## 2021-01-28 DIAGNOSIS — R41841 Cognitive communication deficit: Secondary | ICD-10-CM | POA: Diagnosis not present

## 2021-01-28 DIAGNOSIS — R2689 Other abnormalities of gait and mobility: Secondary | ICD-10-CM | POA: Diagnosis not present

## 2021-01-29 DIAGNOSIS — E1122 Type 2 diabetes mellitus with diabetic chronic kidney disease: Secondary | ICD-10-CM | POA: Diagnosis not present

## 2021-01-29 DIAGNOSIS — M6281 Muscle weakness (generalized): Secondary | ICD-10-CM | POA: Diagnosis not present

## 2021-01-29 DIAGNOSIS — R2689 Other abnormalities of gait and mobility: Secondary | ICD-10-CM | POA: Diagnosis not present

## 2021-01-29 DIAGNOSIS — R41841 Cognitive communication deficit: Secondary | ICD-10-CM | POA: Diagnosis not present

## 2021-02-01 DIAGNOSIS — R2689 Other abnormalities of gait and mobility: Secondary | ICD-10-CM | POA: Diagnosis not present

## 2021-02-01 DIAGNOSIS — M6281 Muscle weakness (generalized): Secondary | ICD-10-CM | POA: Diagnosis not present

## 2021-02-01 DIAGNOSIS — E1122 Type 2 diabetes mellitus with diabetic chronic kidney disease: Secondary | ICD-10-CM | POA: Diagnosis not present

## 2021-02-01 DIAGNOSIS — R41841 Cognitive communication deficit: Secondary | ICD-10-CM | POA: Diagnosis not present

## 2021-02-02 DIAGNOSIS — R2689 Other abnormalities of gait and mobility: Secondary | ICD-10-CM | POA: Diagnosis not present

## 2021-02-02 DIAGNOSIS — M6281 Muscle weakness (generalized): Secondary | ICD-10-CM | POA: Diagnosis not present

## 2021-02-02 DIAGNOSIS — R41841 Cognitive communication deficit: Secondary | ICD-10-CM | POA: Diagnosis not present

## 2021-02-02 DIAGNOSIS — E1122 Type 2 diabetes mellitus with diabetic chronic kidney disease: Secondary | ICD-10-CM | POA: Diagnosis not present

## 2021-02-03 DIAGNOSIS — R2689 Other abnormalities of gait and mobility: Secondary | ICD-10-CM | POA: Diagnosis not present

## 2021-02-03 DIAGNOSIS — E1122 Type 2 diabetes mellitus with diabetic chronic kidney disease: Secondary | ICD-10-CM | POA: Diagnosis not present

## 2021-02-03 DIAGNOSIS — R41841 Cognitive communication deficit: Secondary | ICD-10-CM | POA: Diagnosis not present

## 2021-02-03 DIAGNOSIS — M6281 Muscle weakness (generalized): Secondary | ICD-10-CM | POA: Diagnosis not present

## 2021-02-04 DIAGNOSIS — R41841 Cognitive communication deficit: Secondary | ICD-10-CM | POA: Diagnosis not present

## 2021-02-04 DIAGNOSIS — R2689 Other abnormalities of gait and mobility: Secondary | ICD-10-CM | POA: Diagnosis not present

## 2021-02-04 DIAGNOSIS — M6281 Muscle weakness (generalized): Secondary | ICD-10-CM | POA: Diagnosis not present

## 2021-02-04 DIAGNOSIS — E1122 Type 2 diabetes mellitus with diabetic chronic kidney disease: Secondary | ICD-10-CM | POA: Diagnosis not present

## 2021-02-05 DIAGNOSIS — R41841 Cognitive communication deficit: Secondary | ICD-10-CM | POA: Diagnosis not present

## 2021-02-05 DIAGNOSIS — M6281 Muscle weakness (generalized): Secondary | ICD-10-CM | POA: Diagnosis not present

## 2021-02-05 DIAGNOSIS — R2689 Other abnormalities of gait and mobility: Secondary | ICD-10-CM | POA: Diagnosis not present

## 2021-02-05 DIAGNOSIS — E1122 Type 2 diabetes mellitus with diabetic chronic kidney disease: Secondary | ICD-10-CM | POA: Diagnosis not present

## 2021-02-09 DIAGNOSIS — M2681 Anterior soft tissue impingement: Secondary | ICD-10-CM | POA: Diagnosis not present

## 2021-02-09 DIAGNOSIS — E1122 Type 2 diabetes mellitus with diabetic chronic kidney disease: Secondary | ICD-10-CM | POA: Diagnosis not present

## 2021-02-09 DIAGNOSIS — R41841 Cognitive communication deficit: Secondary | ICD-10-CM | POA: Diagnosis not present

## 2021-02-09 DIAGNOSIS — R2689 Other abnormalities of gait and mobility: Secondary | ICD-10-CM | POA: Diagnosis not present

## 2021-02-10 DIAGNOSIS — R2689 Other abnormalities of gait and mobility: Secondary | ICD-10-CM | POA: Diagnosis not present

## 2021-02-10 DIAGNOSIS — E1122 Type 2 diabetes mellitus with diabetic chronic kidney disease: Secondary | ICD-10-CM | POA: Diagnosis not present

## 2021-02-10 DIAGNOSIS — R41841 Cognitive communication deficit: Secondary | ICD-10-CM | POA: Diagnosis not present

## 2021-02-10 DIAGNOSIS — M2681 Anterior soft tissue impingement: Secondary | ICD-10-CM | POA: Diagnosis not present

## 2021-02-11 DIAGNOSIS — N390 Urinary tract infection, site not specified: Secondary | ICD-10-CM | POA: Diagnosis not present

## 2021-02-11 DIAGNOSIS — M2681 Anterior soft tissue impingement: Secondary | ICD-10-CM | POA: Diagnosis not present

## 2021-02-11 DIAGNOSIS — I10 Essential (primary) hypertension: Secondary | ICD-10-CM | POA: Diagnosis not present

## 2021-02-11 DIAGNOSIS — R41841 Cognitive communication deficit: Secondary | ICD-10-CM | POA: Diagnosis not present

## 2021-02-11 DIAGNOSIS — R2689 Other abnormalities of gait and mobility: Secondary | ICD-10-CM | POA: Diagnosis not present

## 2021-02-11 DIAGNOSIS — Z299 Encounter for prophylactic measures, unspecified: Secondary | ICD-10-CM | POA: Diagnosis not present

## 2021-02-11 DIAGNOSIS — E1122 Type 2 diabetes mellitus with diabetic chronic kidney disease: Secondary | ICD-10-CM | POA: Diagnosis not present

## 2021-02-11 DIAGNOSIS — M549 Dorsalgia, unspecified: Secondary | ICD-10-CM | POA: Diagnosis not present

## 2021-02-11 DIAGNOSIS — I4891 Unspecified atrial fibrillation: Secondary | ICD-10-CM | POA: Diagnosis not present

## 2021-02-12 DIAGNOSIS — M2681 Anterior soft tissue impingement: Secondary | ICD-10-CM | POA: Diagnosis not present

## 2021-02-12 DIAGNOSIS — R2689 Other abnormalities of gait and mobility: Secondary | ICD-10-CM | POA: Diagnosis not present

## 2021-02-12 DIAGNOSIS — R41841 Cognitive communication deficit: Secondary | ICD-10-CM | POA: Diagnosis not present

## 2021-02-12 DIAGNOSIS — E1122 Type 2 diabetes mellitus with diabetic chronic kidney disease: Secondary | ICD-10-CM | POA: Diagnosis not present

## 2021-02-14 DIAGNOSIS — I10 Essential (primary) hypertension: Secondary | ICD-10-CM | POA: Diagnosis not present

## 2021-02-14 DIAGNOSIS — A419 Sepsis, unspecified organism: Secondary | ICD-10-CM | POA: Diagnosis not present

## 2021-02-14 DIAGNOSIS — T68XXXA Hypothermia, initial encounter: Secondary | ICD-10-CM | POA: Diagnosis not present

## 2021-02-14 DIAGNOSIS — J189 Pneumonia, unspecified organism: Secondary | ICD-10-CM | POA: Diagnosis not present

## 2021-02-14 DIAGNOSIS — R0902 Hypoxemia: Secondary | ICD-10-CM | POA: Diagnosis not present

## 2021-02-14 DIAGNOSIS — R404 Transient alteration of awareness: Secondary | ICD-10-CM | POA: Diagnosis not present

## 2021-02-14 DIAGNOSIS — R509 Fever, unspecified: Secondary | ICD-10-CM | POA: Diagnosis not present

## 2021-02-14 DIAGNOSIS — E161 Other hypoglycemia: Secondary | ICD-10-CM | POA: Diagnosis not present

## 2021-02-14 DIAGNOSIS — N390 Urinary tract infection, site not specified: Secondary | ICD-10-CM | POA: Diagnosis not present

## 2021-02-14 DIAGNOSIS — E162 Hypoglycemia, unspecified: Secondary | ICD-10-CM | POA: Diagnosis not present

## 2021-02-14 DIAGNOSIS — F0390 Unspecified dementia without behavioral disturbance: Secondary | ICD-10-CM | POA: Diagnosis not present

## 2021-02-14 DIAGNOSIS — Z66 Do not resuscitate: Secondary | ICD-10-CM | POA: Diagnosis not present

## 2021-02-14 DIAGNOSIS — I517 Cardiomegaly: Secondary | ICD-10-CM | POA: Diagnosis not present

## 2021-02-23 ENCOUNTER — Ambulatory Visit: Payer: Medicare PPO | Admitting: Vascular Surgery

## 2021-02-23 ENCOUNTER — Encounter (HOSPITAL_COMMUNITY): Payer: Medicare PPO

## 2021-03-11 DEATH — deceased
